# Patient Record
Sex: Female | Born: 1955
Health system: Southern US, Community
[De-identification: ages and names within clinical notes are randomized; demographics above are authoritative.]

## PROBLEM LIST (undated history)

## (undated) DIAGNOSIS — I25810 Atherosclerosis of coronary artery bypass graft(s) without angina pectoris: Secondary | ICD-10-CM

## (undated) DIAGNOSIS — I10 Essential (primary) hypertension: Secondary | ICD-10-CM

## (undated) DIAGNOSIS — I2510 Atherosclerotic heart disease of native coronary artery without angina pectoris: Secondary | ICD-10-CM

## (undated) DIAGNOSIS — M109 Gout, unspecified: Secondary | ICD-10-CM

## (undated) DIAGNOSIS — R51 Headache: Secondary | ICD-10-CM

## (undated) DIAGNOSIS — E669 Obesity, unspecified: Secondary | ICD-10-CM

## (undated) DIAGNOSIS — E785 Hyperlipidemia, unspecified: Secondary | ICD-10-CM

## (undated) DIAGNOSIS — I509 Heart failure, unspecified: Secondary | ICD-10-CM

## (undated) DIAGNOSIS — I739 Peripheral vascular disease, unspecified: Secondary | ICD-10-CM

## (undated) DIAGNOSIS — I209 Angina pectoris, unspecified: Secondary | ICD-10-CM

## (undated) DIAGNOSIS — E1140 Type 2 diabetes mellitus with diabetic neuropathy, unspecified: Secondary | ICD-10-CM

## (undated) DIAGNOSIS — Z9289 Personal history of other medical treatment: Secondary | ICD-10-CM

## (undated) DIAGNOSIS — I219 Acute myocardial infarction, unspecified: Secondary | ICD-10-CM

## (undated) DIAGNOSIS — I639 Cerebral infarction, unspecified: Secondary | ICD-10-CM

## (undated) DIAGNOSIS — H269 Unspecified cataract: Secondary | ICD-10-CM

## (undated) DIAGNOSIS — G43909 Migraine, unspecified, not intractable, without status migrainosus: Secondary | ICD-10-CM

## (undated) DIAGNOSIS — K219 Gastro-esophageal reflux disease without esophagitis: Secondary | ICD-10-CM

## (undated) DIAGNOSIS — N189 Chronic kidney disease, unspecified: Secondary | ICD-10-CM

## (undated) DIAGNOSIS — R519 Headache, unspecified: Secondary | ICD-10-CM

## (undated) DIAGNOSIS — M1990 Unspecified osteoarthritis, unspecified site: Secondary | ICD-10-CM

## (undated) DIAGNOSIS — E119 Type 2 diabetes mellitus without complications: Secondary | ICD-10-CM

## (undated) DIAGNOSIS — D649 Anemia, unspecified: Secondary | ICD-10-CM

## (undated) DIAGNOSIS — F419 Anxiety disorder, unspecified: Secondary | ICD-10-CM

## (undated) DIAGNOSIS — J189 Pneumonia, unspecified organism: Secondary | ICD-10-CM

## (undated) DIAGNOSIS — J42 Unspecified chronic bronchitis: Secondary | ICD-10-CM

## (undated) DIAGNOSIS — E039 Hypothyroidism, unspecified: Secondary | ICD-10-CM

## (undated) HISTORY — DX: Hyperlipidemia, unspecified: E78.5

## (undated) HISTORY — DX: Unspecified cataract: H26.9

## (undated) HISTORY — PX: PERIPHERAL ARTERIAL STENT GRAFT: SHX2220

## (undated) HISTORY — DX: Obesity, unspecified: E66.9

## (undated) HISTORY — PX: CATARACT EXTRACTION, BILATERAL: SHX1313

## (undated) HISTORY — PX: CORONARY ANGIOPLASTY WITH STENT PLACEMENT: SHX49

## (undated) HISTORY — DX: Atherosclerotic heart disease of native coronary artery without angina pectoris: I25.10

## (undated) HISTORY — PX: BREAST CYST EXCISION: SHX579

## (undated) HISTORY — PX: OTHER SURGICAL HISTORY: SHX169

## (undated) HISTORY — DX: Hypothyroidism, unspecified: E03.9

## (undated) SURGERY — COLONOSCOPY WITH PROPOFOL
Anesthesia: Monitor Anesthesia Care

---

## 1978-04-15 HISTORY — PX: APPENDECTOMY: SHX54

## 1978-04-15 HISTORY — PX: TUBAL LIGATION: SHX77

## 1980-04-15 HISTORY — PX: CHOLECYSTECTOMY: SHX55

## 1997-12-15 ENCOUNTER — Emergency Department (HOSPITAL_COMMUNITY): Admission: EM | Admit: 1997-12-15 | Discharge: 1997-12-15 | Payer: Self-pay | Admitting: Internal Medicine

## 1998-05-19 ENCOUNTER — Ambulatory Visit (HOSPITAL_COMMUNITY): Admission: RE | Admit: 1998-05-19 | Discharge: 1998-05-19 | Payer: Self-pay | Admitting: Family Medicine

## 1998-05-19 ENCOUNTER — Encounter: Payer: Self-pay | Admitting: Family Medicine

## 2000-04-15 DIAGNOSIS — I2510 Atherosclerotic heart disease of native coronary artery without angina pectoris: Secondary | ICD-10-CM

## 2000-04-15 DIAGNOSIS — Z9289 Personal history of other medical treatment: Secondary | ICD-10-CM

## 2000-04-15 HISTORY — DX: Atherosclerotic heart disease of native coronary artery without angina pectoris: I25.10

## 2000-04-15 HISTORY — DX: Personal history of other medical treatment: Z92.89

## 2000-04-15 HISTORY — PX: CARDIAC CATHETERIZATION: SHX172

## 2000-11-15 ENCOUNTER — Encounter: Payer: Self-pay | Admitting: *Deleted

## 2000-11-15 ENCOUNTER — Inpatient Hospital Stay (HOSPITAL_COMMUNITY): Admission: EM | Admit: 2000-11-15 | Discharge: 2000-11-28 | Payer: Self-pay | Admitting: *Deleted

## 2000-11-20 ENCOUNTER — Encounter: Payer: Self-pay | Admitting: Thoracic Surgery (Cardiothoracic Vascular Surgery)

## 2000-11-20 HISTORY — PX: CORONARY ARTERY BYPASS GRAFT: SHX141

## 2000-11-21 ENCOUNTER — Encounter: Payer: Self-pay | Admitting: Thoracic Surgery (Cardiothoracic Vascular Surgery)

## 2000-11-22 ENCOUNTER — Encounter: Payer: Self-pay | Admitting: Thoracic Surgery (Cardiothoracic Vascular Surgery)

## 2000-11-25 ENCOUNTER — Encounter: Payer: Self-pay | Admitting: Thoracic Surgery (Cardiothoracic Vascular Surgery)

## 2000-11-26 ENCOUNTER — Encounter: Payer: Self-pay | Admitting: Thoracic Surgery (Cardiothoracic Vascular Surgery)

## 2000-11-27 ENCOUNTER — Encounter: Payer: Self-pay | Admitting: Thoracic Surgery (Cardiothoracic Vascular Surgery)

## 2000-11-28 ENCOUNTER — Encounter: Payer: Self-pay | Admitting: Thoracic Surgery (Cardiothoracic Vascular Surgery)

## 2000-11-30 ENCOUNTER — Emergency Department (HOSPITAL_COMMUNITY): Admission: EM | Admit: 2000-11-30 | Discharge: 2000-11-30 | Payer: Self-pay | Admitting: *Deleted

## 2000-11-30 ENCOUNTER — Encounter: Payer: Self-pay | Admitting: Thoracic Surgery (Cardiothoracic Vascular Surgery)

## 2000-12-08 ENCOUNTER — Encounter: Payer: Self-pay | Admitting: Thoracic Surgery (Cardiothoracic Vascular Surgery)

## 2000-12-08 ENCOUNTER — Encounter
Admission: RE | Admit: 2000-12-08 | Discharge: 2000-12-08 | Payer: Self-pay | Admitting: Thoracic Surgery (Cardiothoracic Vascular Surgery)

## 2000-12-31 ENCOUNTER — Encounter (HOSPITAL_COMMUNITY): Admission: RE | Admit: 2000-12-31 | Discharge: 2001-01-26 | Payer: Self-pay | Admitting: Cardiology

## 2003-09-26 ENCOUNTER — Encounter: Admission: RE | Admit: 2003-09-26 | Discharge: 2003-12-25 | Payer: Self-pay | Admitting: Family Medicine

## 2004-08-27 HISTORY — PX: NM MYOCAR PERF WALL MOTION: HXRAD629

## 2004-10-01 ENCOUNTER — Encounter: Admission: RE | Admit: 2004-10-01 | Discharge: 2004-10-01 | Payer: Self-pay | Admitting: Cardiology

## 2004-10-04 ENCOUNTER — Ambulatory Visit (HOSPITAL_COMMUNITY): Admission: RE | Admit: 2004-10-04 | Discharge: 2004-10-05 | Payer: Self-pay | Admitting: Cardiology

## 2006-01-01 ENCOUNTER — Ambulatory Visit: Payer: Self-pay | Admitting: Family Medicine

## 2006-01-02 ENCOUNTER — Ambulatory Visit: Payer: Self-pay | Admitting: *Deleted

## 2006-01-22 ENCOUNTER — Ambulatory Visit: Payer: Self-pay | Admitting: Family Medicine

## 2006-03-23 ENCOUNTER — Emergency Department (HOSPITAL_COMMUNITY): Admission: EM | Admit: 2006-03-23 | Discharge: 2006-03-23 | Payer: Self-pay | Admitting: Emergency Medicine

## 2006-03-24 ENCOUNTER — Ambulatory Visit: Payer: Self-pay | Admitting: Family Medicine

## 2006-04-28 ENCOUNTER — Ambulatory Visit: Payer: Self-pay | Admitting: Family Medicine

## 2006-06-19 ENCOUNTER — Ambulatory Visit: Payer: Self-pay | Admitting: Family Medicine

## 2006-07-31 ENCOUNTER — Ambulatory Visit: Payer: Self-pay | Admitting: Family Medicine

## 2006-12-10 ENCOUNTER — Ambulatory Visit: Payer: Self-pay | Admitting: Internal Medicine

## 2006-12-31 ENCOUNTER — Encounter (INDEPENDENT_AMBULATORY_CARE_PROVIDER_SITE_OTHER): Payer: Self-pay | Admitting: *Deleted

## 2007-02-11 ENCOUNTER — Ambulatory Visit: Payer: Self-pay | Admitting: Internal Medicine

## 2007-03-04 ENCOUNTER — Ambulatory Visit: Payer: Self-pay | Admitting: Internal Medicine

## 2007-03-04 LAB — CONVERTED CEMR LAB
BUN: 17 mg/dL (ref 6–23)
Chloride: 103 meq/L (ref 96–112)
Glucose, Bld: 219 mg/dL — ABNORMAL HIGH (ref 70–99)
HDL: 45 mg/dL (ref >39)
Potassium: 4.5 meq/L (ref 3.5–5.3)
Total CHOL/HDL Ratio: 5.4
Total Protein: 7.5 g/dL (ref 6.0–8.3)
VLDL: 54 mg/dL — ABNORMAL HIGH (ref 0–40)

## 2007-05-01 ENCOUNTER — Ambulatory Visit: Payer: Self-pay | Admitting: Internal Medicine

## 2007-05-01 LAB — CONVERTED CEMR LAB
ALT: 12 units/L (ref 0–35)
Alkaline Phosphatase: 97 units/L (ref 39–117)
BUN: 17 mg/dL (ref 6–23)
Calcium: 10.3 mg/dL (ref 8.4–10.5)
Cholesterol: 268 mg/dL — ABNORMAL HIGH (ref 0–200)
Helicobacter Pylori Antibody-IgG: 1 — ABNORMAL HIGH
LDL Cholesterol: 172 mg/dL — ABNORMAL HIGH (ref 0–99)
Potassium: 4.4 meq/L (ref 3.5–5.3)
Sodium: 138 meq/L (ref 135–145)
Total Bilirubin: 0.3 mg/dL (ref 0.3–1.2)
Triglycerides: 226 mg/dL — ABNORMAL HIGH (ref <150)
VLDL: 45 mg/dL — ABNORMAL HIGH (ref 0–40)

## 2007-05-13 ENCOUNTER — Emergency Department (HOSPITAL_COMMUNITY): Admission: EM | Admit: 2007-05-13 | Discharge: 2007-05-13 | Payer: Self-pay | Admitting: Emergency Medicine

## 2007-05-19 ENCOUNTER — Ambulatory Visit: Payer: Self-pay | Admitting: Internal Medicine

## 2007-06-04 ENCOUNTER — Ambulatory Visit: Payer: Self-pay | Admitting: Thoracic Surgery (Cardiothoracic Vascular Surgery)

## 2007-07-21 ENCOUNTER — Ambulatory Visit: Payer: Self-pay | Admitting: Internal Medicine

## 2007-10-11 ENCOUNTER — Emergency Department (HOSPITAL_COMMUNITY): Admission: EM | Admit: 2007-10-11 | Discharge: 2007-10-11 | Payer: Self-pay | Admitting: Emergency Medicine

## 2007-11-05 ENCOUNTER — Ambulatory Visit: Payer: Self-pay | Admitting: Internal Medicine

## 2008-01-20 ENCOUNTER — Ambulatory Visit: Payer: Self-pay | Admitting: Internal Medicine

## 2008-03-15 ENCOUNTER — Ambulatory Visit: Payer: Self-pay | Admitting: Internal Medicine

## 2008-03-15 LAB — CONVERTED CEMR LAB
BUN: 15 mg/dL (ref 6–23)
Basophils Absolute: 0 10*3/uL (ref 0.0–0.1)
CO2: 22 meq/L (ref 19–32)
Chloride: 103 meq/L (ref 96–112)
HCT: 40.6 % (ref 36.0–46.0)
Hemoglobin: 13.9 g/dL (ref 12.0–15.0)
Lymphocytes Relative: 29 % (ref 12–46)
Lymphs Abs: 1.6 10*3/uL (ref 0.7–4.0)
Monocytes Absolute: 0.3 10*3/uL (ref 0.1–1.0)
Monocytes Relative: 5 % (ref 3–12)
Neutro Abs: 3.4 10*3/uL (ref 1.7–7.7)
Sodium: 141 meq/L (ref 135–145)

## 2008-05-11 ENCOUNTER — Ambulatory Visit (HOSPITAL_COMMUNITY): Admission: RE | Admit: 2008-05-11 | Discharge: 2008-05-11 | Payer: Self-pay | Admitting: Internal Medicine

## 2008-05-11 ENCOUNTER — Encounter: Payer: Self-pay | Admitting: Internal Medicine

## 2008-05-25 ENCOUNTER — Emergency Department (HOSPITAL_COMMUNITY): Admission: EM | Admit: 2008-05-25 | Discharge: 2008-05-25 | Payer: Self-pay | Admitting: Family Medicine

## 2008-07-07 ENCOUNTER — Ambulatory Visit: Payer: Self-pay | Admitting: Internal Medicine

## 2008-07-13 ENCOUNTER — Encounter (INDEPENDENT_AMBULATORY_CARE_PROVIDER_SITE_OTHER): Payer: Self-pay | Admitting: *Deleted

## 2008-10-28 ENCOUNTER — Ambulatory Visit: Payer: Self-pay | Admitting: Internal Medicine

## 2008-11-04 ENCOUNTER — Ambulatory Visit (HOSPITAL_COMMUNITY): Admission: RE | Admit: 2008-11-04 | Discharge: 2008-11-04 | Payer: Self-pay | Admitting: Internal Medicine

## 2008-12-30 ENCOUNTER — Ambulatory Visit: Payer: Self-pay | Admitting: Internal Medicine

## 2009-02-28 ENCOUNTER — Ambulatory Visit: Payer: Self-pay | Admitting: Internal Medicine

## 2009-04-17 ENCOUNTER — Emergency Department (HOSPITAL_COMMUNITY): Admission: EM | Admit: 2009-04-17 | Discharge: 2009-04-17 | Payer: Self-pay | Admitting: Emergency Medicine

## 2009-04-30 ENCOUNTER — Emergency Department (HOSPITAL_COMMUNITY): Admission: EM | Admit: 2009-04-30 | Discharge: 2009-04-30 | Payer: Self-pay | Admitting: Emergency Medicine

## 2009-05-31 ENCOUNTER — Ambulatory Visit: Payer: Self-pay | Admitting: Internal Medicine

## 2009-05-31 LAB — CONVERTED CEMR LAB: GC Probe Amp, Urine: NEGATIVE

## 2009-08-22 ENCOUNTER — Ambulatory Visit: Payer: Self-pay | Admitting: Internal Medicine

## 2009-09-02 ENCOUNTER — Ambulatory Visit: Payer: Self-pay | Admitting: Critical Care Medicine

## 2009-09-03 ENCOUNTER — Inpatient Hospital Stay (HOSPITAL_COMMUNITY): Admission: EM | Admit: 2009-09-03 | Discharge: 2009-09-11 | Payer: Self-pay | Admitting: Emergency Medicine

## 2009-09-04 ENCOUNTER — Encounter (INDEPENDENT_AMBULATORY_CARE_PROVIDER_SITE_OTHER): Payer: Self-pay | Admitting: Pulmonary Disease

## 2009-09-08 ENCOUNTER — Ambulatory Visit: Payer: Self-pay | Admitting: Vascular Surgery

## 2009-09-08 ENCOUNTER — Encounter (INDEPENDENT_AMBULATORY_CARE_PROVIDER_SITE_OTHER): Payer: Self-pay | Admitting: Internal Medicine

## 2009-11-24 ENCOUNTER — Ambulatory Visit (HOSPITAL_COMMUNITY): Admission: RE | Admit: 2009-11-24 | Discharge: 2009-11-24 | Payer: Self-pay | Admitting: Internal Medicine

## 2009-11-27 ENCOUNTER — Ambulatory Visit: Payer: Self-pay | Admitting: Internal Medicine

## 2009-11-27 LAB — CONVERTED CEMR LAB
ALT: 15 units/L (ref 0–35)
Albumin: 4.4 g/dL (ref 3.5–5.2)
CO2: 26 meq/L (ref 19–32)
Calcium: 9.8 mg/dL (ref 8.4–10.5)
Chloride: 105 meq/L (ref 96–112)
Cholesterol: 169 mg/dL (ref 0–200)
Eosinophils Relative: 8 % — ABNORMAL HIGH (ref 0–5)
Glucose, Bld: 212 mg/dL — ABNORMAL HIGH (ref 70–99)
HDL: 43 mg/dL (ref >39)
LDL Cholesterol: 80 mg/dL (ref 0–99)
Lymphs Abs: 1.5 10*3/uL (ref 0.7–4.0)
MCHC: 32.9 g/dL (ref 30.0–36.0)
Monocytes Absolute: 0.4 10*3/uL (ref 0.1–1.0)
Monocytes Relative: 6 % (ref 3–12)
Platelets: 322 10*3/uL (ref 150–400)
RBC: 4.03 M/uL (ref 3.87–5.11)
RDW: 14.3 % (ref 11.5–15.5)
Total CHOL/HDL Ratio: 3.9
Total Protein: 7.9 g/dL (ref 6.0–8.3)
VLDL: 46 mg/dL — ABNORMAL HIGH (ref 0–40)
WBC: 6.4 10*3/uL (ref 4.0–10.5)

## 2009-12-13 ENCOUNTER — Encounter: Admission: RE | Admit: 2009-12-13 | Discharge: 2009-12-13 | Payer: Self-pay | Admitting: Internal Medicine

## 2010-02-19 ENCOUNTER — Encounter (INDEPENDENT_AMBULATORY_CARE_PROVIDER_SITE_OTHER): Payer: Self-pay | Admitting: Internal Medicine

## 2010-02-19 LAB — CONVERTED CEMR LAB
Chloride: 103 meq/L (ref 96–112)
Potassium: 4.4 meq/L (ref 3.5–5.3)

## 2010-05-06 ENCOUNTER — Encounter: Payer: Self-pay | Admitting: Internal Medicine

## 2010-07-01 LAB — URINALYSIS, ROUTINE W REFLEX MICROSCOPIC
Glucose, UA: NEGATIVE mg/dL
Nitrite: POSITIVE — AB
pH: 5.5 (ref 5.0–8.0)

## 2010-07-01 LAB — RAPID STREP SCREEN (MED CTR MEBANE ONLY): Streptococcus, Group A Screen (Direct): NEGATIVE

## 2010-07-01 LAB — URINE MICROSCOPIC-ADD ON

## 2010-07-02 LAB — BASIC METABOLIC PANEL
BUN: 17 mg/dL (ref 6–23)
BUN: 19 mg/dL (ref 6–23)
BUN: 19 mg/dL (ref 6–23)
BUN: 26 mg/dL — ABNORMAL HIGH (ref 6–23)
CO2: 22 mEq/L (ref 19–32)
CO2: 22 mEq/L (ref 19–32)
CO2: 24 mEq/L (ref 19–32)
CO2: 24 mEq/L (ref 19–32)
CO2: 25 mEq/L (ref 19–32)
Calcium: 8.6 mg/dL (ref 8.4–10.5)
Calcium: 8.9 mg/dL (ref 8.4–10.5)
Calcium: 9.2 mg/dL (ref 8.4–10.5)
Calcium: 9.5 mg/dL (ref 8.4–10.5)
Chloride: 109 mEq/L (ref 96–112)
Chloride: 112 mEq/L (ref 96–112)
Chloride: 112 mEq/L (ref 96–112)
Chloride: 116 mEq/L — ABNORMAL HIGH (ref 96–112)
Creatinine, Ser: 1.5 mg/dL — ABNORMAL HIGH (ref 0.4–1.2)
Creatinine, Ser: 1.58 mg/dL — ABNORMAL HIGH (ref 0.4–1.2)
GFR calc Af Amer: 24 mL/min — ABNORMAL LOW (ref >60)
GFR calc Af Amer: 39 mL/min — ABNORMAL LOW (ref >60)
GFR calc Af Amer: 41 mL/min — ABNORMAL LOW (ref >60)
GFR calc Af Amer: 42 mL/min — ABNORMAL LOW (ref >60)
GFR calc Af Amer: 44 mL/min — ABNORMAL LOW (ref >60)
GFR calc non Af Amer: 19 mL/min — ABNORMAL LOW (ref >60)
GFR calc non Af Amer: 30 mL/min — ABNORMAL LOW (ref >60)
GFR calc non Af Amer: 34 mL/min — ABNORMAL LOW (ref >60)
GFR calc non Af Amer: 36 mL/min — ABNORMAL LOW (ref >60)
Glucose, Bld: 150 mg/dL — ABNORMAL HIGH (ref 70–99)
Glucose, Bld: 166 mg/dL — ABNORMAL HIGH (ref 70–99)
Glucose, Bld: 240 mg/dL — ABNORMAL HIGH (ref 70–99)
Glucose, Bld: 258 mg/dL — ABNORMAL HIGH (ref 70–99)
Potassium: 3.5 mEq/L (ref 3.5–5.1)
Potassium: 3.9 mEq/L (ref 3.5–5.1)
Potassium: 3.9 mEq/L (ref 3.5–5.1)
Potassium: 3.9 mEq/L (ref 3.5–5.1)
Potassium: 4 mEq/L (ref 3.5–5.1)
Potassium: 4.7 mEq/L (ref 3.5–5.1)
Sodium: 138 mEq/L (ref 135–145)
Sodium: 138 mEq/L (ref 135–145)
Sodium: 140 mEq/L (ref 135–145)
Sodium: 142 mEq/L (ref 135–145)
Sodium: 142 mEq/L (ref 135–145)

## 2010-07-02 LAB — CARDIAC PANEL(CRET KIN+CKTOT+MB+TROPI)
CK, MB: 2.3 ng/mL (ref 0.3–4.0)
CK, MB: 3.3 ng/mL (ref 0.3–4.0)
CK, MB: 33.9 ng/mL (ref 0.3–4.0)
CK, MB: 6.6 ng/mL (ref 0.3–4.0)
Relative Index: 0.2 (ref 0.0–2.5)
Relative Index: 0.2 (ref 0.0–2.5)
Relative Index: 0.4 (ref 0.0–2.5)
Relative Index: 0.5 (ref 0.0–2.5)
Relative Index: 0.7 (ref 0.0–2.5)
Total CK: 1162 U/L — ABNORMAL HIGH (ref 7–177)
Total CK: 2706 U/L — ABNORMAL HIGH (ref 7–177)
Total CK: 4596 U/L — ABNORMAL HIGH (ref 7–177)
Total CK: 815 U/L — ABNORMAL HIGH (ref 7–177)
Total CK: 825 U/L — ABNORMAL HIGH (ref 7–177)
Troponin I: 2.44 ng/mL (ref 0.00–0.06)
Troponin I: 4.47 ng/mL (ref 0.00–0.06)
Troponin I: 7.49 ng/mL (ref 0.00–0.06)

## 2010-07-02 LAB — GLUCOSE, CAPILLARY
Glucose-Capillary: 195 mg/dL — ABNORMAL HIGH (ref 70–99)
Glucose-Capillary: 238 mg/dL — ABNORMAL HIGH (ref 70–99)
Glucose-Capillary: 108 mg/dL — ABNORMAL HIGH (ref 70–99)
Glucose-Capillary: 119 mg/dL — ABNORMAL HIGH (ref 70–99)
Glucose-Capillary: 123 mg/dL — ABNORMAL HIGH (ref 70–99)
Glucose-Capillary: 130 mg/dL — ABNORMAL HIGH (ref 70–99)
Glucose-Capillary: 133 mg/dL — ABNORMAL HIGH (ref 70–99)
Glucose-Capillary: 134 mg/dL — ABNORMAL HIGH (ref 70–99)
Glucose-Capillary: 135 mg/dL — ABNORMAL HIGH (ref 70–99)
Glucose-Capillary: 140 mg/dL — ABNORMAL HIGH (ref 70–99)
Glucose-Capillary: 143 mg/dL — ABNORMAL HIGH (ref 70–99)
Glucose-Capillary: 149 mg/dL — ABNORMAL HIGH (ref 70–99)
Glucose-Capillary: 152 mg/dL — ABNORMAL HIGH (ref 70–99)
Glucose-Capillary: 154 mg/dL — ABNORMAL HIGH (ref 70–99)
Glucose-Capillary: 156 mg/dL — ABNORMAL HIGH (ref 70–99)
Glucose-Capillary: 161 mg/dL — ABNORMAL HIGH (ref 70–99)
Glucose-Capillary: 163 mg/dL — ABNORMAL HIGH (ref 70–99)
Glucose-Capillary: 163 mg/dL — ABNORMAL HIGH (ref 70–99)
Glucose-Capillary: 169 mg/dL — ABNORMAL HIGH (ref 70–99)
Glucose-Capillary: 172 mg/dL — ABNORMAL HIGH (ref 70–99)
Glucose-Capillary: 175 mg/dL — ABNORMAL HIGH (ref 70–99)
Glucose-Capillary: 180 mg/dL — ABNORMAL HIGH (ref 70–99)
Glucose-Capillary: 182 mg/dL — ABNORMAL HIGH (ref 70–99)
Glucose-Capillary: 186 mg/dL — ABNORMAL HIGH (ref 70–99)
Glucose-Capillary: 188 mg/dL — ABNORMAL HIGH (ref 70–99)
Glucose-Capillary: 190 mg/dL — ABNORMAL HIGH (ref 70–99)
Glucose-Capillary: 194 mg/dL — ABNORMAL HIGH (ref 70–99)
Glucose-Capillary: 197 mg/dL — ABNORMAL HIGH (ref 70–99)
Glucose-Capillary: 199 mg/dL — ABNORMAL HIGH (ref 70–99)
Glucose-Capillary: 202 mg/dL — ABNORMAL HIGH (ref 70–99)
Glucose-Capillary: 202 mg/dL — ABNORMAL HIGH (ref 70–99)
Glucose-Capillary: 211 mg/dL — ABNORMAL HIGH (ref 70–99)
Glucose-Capillary: 212 mg/dL — ABNORMAL HIGH (ref 70–99)
Glucose-Capillary: 218 mg/dL — ABNORMAL HIGH (ref 70–99)
Glucose-Capillary: 222 mg/dL — ABNORMAL HIGH (ref 70–99)
Glucose-Capillary: 223 mg/dL — ABNORMAL HIGH (ref 70–99)
Glucose-Capillary: 235 mg/dL — ABNORMAL HIGH (ref 70–99)
Glucose-Capillary: 237 mg/dL — ABNORMAL HIGH (ref 70–99)
Glucose-Capillary: 243 mg/dL — ABNORMAL HIGH (ref 70–99)
Glucose-Capillary: 259 mg/dL — ABNORMAL HIGH (ref 70–99)
Glucose-Capillary: 273 mg/dL — ABNORMAL HIGH (ref 70–99)
Glucose-Capillary: 277 mg/dL — ABNORMAL HIGH (ref 70–99)
Glucose-Capillary: 285 mg/dL — ABNORMAL HIGH (ref 70–99)
Glucose-Capillary: 304 mg/dL — ABNORMAL HIGH (ref 70–99)
Glucose-Capillary: 373 mg/dL — ABNORMAL HIGH (ref 70–99)
Glucose-Capillary: >600 mg/dL (ref 70–99)

## 2010-07-02 LAB — CBC
HCT: 25.4 % — ABNORMAL LOW (ref 36.0–46.0)
HCT: 25.8 % — ABNORMAL LOW (ref 36.0–46.0)
HCT: 26.3 % — ABNORMAL LOW (ref 36.0–46.0)
HCT: 31.4 % — ABNORMAL LOW (ref 36.0–46.0)
HCT: 32.7 % — ABNORMAL LOW (ref 36.0–46.0)
HCT: 40.7 % (ref 36.0–46.0)
HCT: 46.3 % — ABNORMAL HIGH (ref 36.0–46.0)
Hemoglobin: 10.9 g/dL — ABNORMAL LOW (ref 12.0–15.0)
Hemoglobin: 14 g/dL (ref 12.0–15.0)
Hemoglobin: 15.9 g/dL — ABNORMAL HIGH (ref 12.0–15.0)
Hemoglobin: 8.7 g/dL — ABNORMAL LOW (ref 12.0–15.0)
Hemoglobin: 9 g/dL — ABNORMAL LOW (ref 12.0–15.0)
MCHC: 33.7 g/dL (ref 30.0–36.0)
MCHC: 34.1 g/dL (ref 30.0–36.0)
MCHC: 34.4 g/dL (ref 30.0–36.0)
MCHC: 34.4 g/dL (ref 30.0–36.0)
MCHC: 34.5 g/dL (ref 30.0–36.0)
MCHC: 34.5 g/dL (ref 30.0–36.0)
MCHC: 34.7 g/dL (ref 30.0–36.0)
MCV: 94.8 fL (ref 78.0–100.0)
MCV: 94.8 fL (ref 78.0–100.0)
MCV: 95 fL (ref 78.0–100.0)
MCV: 95.8 fL (ref 78.0–100.0)
Platelets: 169 10*3/uL (ref 150–400)
Platelets: 225 10*3/uL (ref 150–400)
Platelets: 253 10*3/uL (ref 150–400)
Platelets: 273 10*3/uL (ref 150–400)
Platelets: 290 10*3/uL (ref 150–400)
RBC: 2.69 MIL/uL — ABNORMAL LOW (ref 3.87–5.11)
RBC: 2.77 MIL/uL — ABNORMAL LOW (ref 3.87–5.11)
RBC: 3.45 MIL/uL — ABNORMAL LOW (ref 3.87–5.11)
RBC: 4.88 MIL/uL (ref 3.87–5.11)
RDW: 13.5 % (ref 11.5–15.5)
RDW: 13.5 % (ref 11.5–15.5)
RDW: 13.6 % (ref 11.5–15.5)
RDW: 13.7 % (ref 11.5–15.5)
RDW: 13.9 % (ref 11.5–15.5)
RDW: 14 % (ref 11.5–15.5)
WBC: 11 10*3/uL — ABNORMAL HIGH (ref 4.0–10.5)
WBC: 7.3 10*3/uL (ref 4.0–10.5)
WBC: 7.6 10*3/uL (ref 4.0–10.5)
WBC: 8.7 10*3/uL (ref 4.0–10.5)

## 2010-07-02 LAB — URINALYSIS, ROUTINE W REFLEX MICROSCOPIC
Bilirubin Urine: NEGATIVE
Bilirubin Urine: NEGATIVE
Bilirubin Urine: NEGATIVE
Ketones, ur: NEGATIVE mg/dL
Ketones, ur: NEGATIVE mg/dL
Ketones, ur: NEGATIVE mg/dL
Leukocytes, UA: NEGATIVE
Nitrite: NEGATIVE
Nitrite: NEGATIVE
Protein, ur: 30 mg/dL — AB
Specific Gravity, Urine: 1.016 (ref 1.005–1.030)
Specific Gravity, Urine: 1.02 (ref 1.005–1.030)
Specific Gravity, Urine: 1.026 (ref 1.005–1.030)
Urobilinogen, UA: 0.2 mg/dL (ref 0.0–1.0)
Urobilinogen, UA: 0.2 mg/dL (ref 0.0–1.0)
Urobilinogen, UA: 0.2 mg/dL (ref 0.0–1.0)
Urobilinogen, UA: 0.2 mg/dL (ref 0.0–1.0)
pH: 5 (ref 5.0–8.0)
pH: 6 (ref 5.0–8.0)

## 2010-07-02 LAB — POCT I-STAT 3, ART BLOOD GAS (G3+)
Acid-base deficit: 1 mmol/L (ref 0.0–2.0)
Acid-base deficit: 2 mmol/L (ref 0.0–2.0)
Bicarbonate: 22.2 mEq/L (ref 20.0–24.0)
Bicarbonate: 26 mEq/L — ABNORMAL HIGH (ref 20.0–24.0)
O2 Saturation: 91 %
O2 Saturation: 95 %
O2 Saturation: 95 %
O2 Saturation: 98 %
Patient temperature: 39.8
Patient temperature: 40.2
Patient temperature: 98.6
TCO2: 20 mmol/L (ref 0–100)
TCO2: 22 mmol/L (ref 0–100)
TCO2: 23 mmol/L (ref 0–100)
TCO2: 26 mmol/L (ref 0–100)
pCO2 arterial: 37.4 mmHg (ref 35.0–45.0)
pCO2 arterial: 38.3 mmHg (ref 35.0–45.0)
pCO2 arterial: 46.5 mmHg — ABNORMAL HIGH (ref 35.0–45.0)
pH, Arterial: 7.367 (ref 7.350–7.400)
pO2, Arterial: 66 mmHg — ABNORMAL LOW (ref 80.0–100.0)
pO2, Arterial: 91 mmHg (ref 80.0–100.0)

## 2010-07-02 LAB — HEPATIC FUNCTION PANEL
Albumin: 2.9 g/dL — ABNORMAL LOW (ref 3.5–5.2)
Indirect Bilirubin: 0.7 mg/dL (ref 0.3–0.9)
Total Bilirubin: 0.8 mg/dL (ref 0.3–1.2)
Total Protein: 6.5 g/dL (ref 6.0–8.3)

## 2010-07-02 LAB — MRSA PCR SCREENING: MRSA by PCR: NEGATIVE

## 2010-07-02 LAB — DIFFERENTIAL
Basophils Absolute: 0 10*3/uL (ref 0.0–0.1)
Basophils Relative: 0 % (ref 0–1)
Eosinophils Absolute: 0.1 10*3/uL (ref 0.0–0.7)
Eosinophils Absolute: 0.4 10*3/uL (ref 0.0–0.7)
Eosinophils Relative: 4 % (ref 0–5)
Lymphocytes Relative: 8 % — ABNORMAL LOW (ref 12–46)
Lymphs Abs: 1.1 10*3/uL (ref 0.7–4.0)
Lymphs Abs: 1.2 10*3/uL (ref 0.7–4.0)
Lymphs Abs: 2.5 10*3/uL (ref 0.7–4.0)
Monocytes Absolute: 0.5 10*3/uL (ref 0.1–1.0)
Monocytes Relative: 1 % — ABNORMAL LOW (ref 3–12)
Monocytes Relative: 5 % (ref 3–12)
Neutro Abs: 12.4 10*3/uL — ABNORMAL HIGH (ref 1.7–7.7)
Neutro Abs: 18.1 10*3/uL — ABNORMAL HIGH (ref 1.7–7.7)
Neutrophils Relative %: 86 % — ABNORMAL HIGH (ref 43–77)
Neutrophils Relative %: 87 % — ABNORMAL HIGH (ref 43–77)

## 2010-07-02 LAB — COMPREHENSIVE METABOLIC PANEL
ALT: 23 U/L (ref 0–35)
ALT: 33 U/L (ref 0–35)
AST: 38 U/L — ABNORMAL HIGH (ref 0–37)
AST: 69 U/L — ABNORMAL HIGH (ref 0–37)
Albumin: 2.3 g/dL — ABNORMAL LOW (ref 3.5–5.2)
Alkaline Phosphatase: 119 U/L — ABNORMAL HIGH (ref 39–117)
Alkaline Phosphatase: 81 U/L (ref 39–117)
BUN: 19 mg/dL (ref 6–23)
BUN: 22 mg/dL (ref 6–23)
CO2: 21 mEq/L (ref 19–32)
CO2: 24 mEq/L (ref 19–32)
Calcium: 8.7 mg/dL (ref 8.4–10.5)
Calcium: 8.8 mg/dL (ref 8.4–10.5)
Calcium: 9.6 mg/dL (ref 8.4–10.5)
Chloride: 112 mEq/L (ref 96–112)
Creatinine, Ser: 2.22 mg/dL — ABNORMAL HIGH (ref 0.4–1.2)
GFR calc Af Amer: 28 mL/min — ABNORMAL LOW (ref >60)
GFR calc Af Amer: 34 mL/min — ABNORMAL LOW (ref >60)
GFR calc non Af Amer: 23 mL/min — ABNORMAL LOW (ref >60)
GFR calc non Af Amer: 33 mL/min — ABNORMAL LOW (ref >60)
Glucose, Bld: 222 mg/dL — ABNORMAL HIGH (ref 70–99)
Glucose, Bld: 593 mg/dL (ref 70–99)
Potassium: 3.2 mEq/L — ABNORMAL LOW (ref 3.5–5.1)
Sodium: 147 mEq/L — ABNORMAL HIGH (ref 135–145)
Total Bilirubin: 1 mg/dL (ref 0.3–1.2)
Total Protein: 6.2 g/dL (ref 6.0–8.3)
Total Protein: 8.8 g/dL — ABNORMAL HIGH (ref 6.0–8.3)

## 2010-07-02 LAB — PROTIME-INR
INR: 1 (ref 0.00–1.49)
INR: 1.12 (ref 0.00–1.49)
Prothrombin Time: 13.1 seconds (ref 11.6–15.2)
Prothrombin Time: 14.3 seconds (ref 11.6–15.2)

## 2010-07-02 LAB — CK TOTAL AND CKMB (NOT AT ARMC)
CK, MB: 2.9 ng/mL (ref 0.3–4.0)
CK, MB: 3.5 ng/mL (ref 0.3–4.0)
Relative Index: 0.6 (ref 0.0–2.5)
Total CK: 126 U/L (ref 7–177)
Total CK: 610 U/L — ABNORMAL HIGH (ref 7–177)

## 2010-07-02 LAB — POCT CARDIAC MARKERS
Myoglobin, poc: 163 ng/mL (ref 12–200)
Troponin i, poc: <0.05 ng/mL (ref 0.00–0.09)

## 2010-07-02 LAB — POCT I-STAT, CHEM 8
BUN: 29 mg/dL — ABNORMAL HIGH (ref 6–23)
Calcium, Ion: 1.06 mmol/L — ABNORMAL LOW (ref 1.12–1.32)
Calcium, Ion: 1.08 mmol/L — ABNORMAL LOW (ref 1.12–1.32)
Chloride: 105 mEq/L (ref 96–112)
Creatinine, Ser: 1.7 mg/dL — ABNORMAL HIGH (ref 0.4–1.2)
Glucose, Bld: 602 mg/dL (ref 70–99)
Glucose, Bld: 621 mg/dL (ref 70–99)
Hemoglobin: 16 g/dL — ABNORMAL HIGH (ref 12.0–15.0)
TCO2: 24 mmol/L (ref 0–100)

## 2010-07-02 LAB — SODIUM, URINE, RANDOM: Sodium, Ur: 66 mEq/L

## 2010-07-02 LAB — CULTURE, BLOOD (ROUTINE X 2)

## 2010-07-02 LAB — RAPID URINE DRUG SCREEN, HOSP PERFORMED
Amphetamines: NOT DETECTED
Barbiturates: NOT DETECTED
Benzodiazepines: NOT DETECTED
Cocaine: NOT DETECTED
Cocaine: NOT DETECTED
Opiates: NOT DETECTED

## 2010-07-02 LAB — URINE MICROSCOPIC-ADD ON

## 2010-07-02 LAB — LIPID PANEL
HDL: 30 mg/dL — ABNORMAL LOW (ref >39)
VLDL: UNDETERMINED mg/dL (ref 0–40)

## 2010-07-02 LAB — HEMOGLOBIN A1C
Hgb A1c MFr Bld: 11.7 % — ABNORMAL HIGH (ref <5.7)
Mean Plasma Glucose: 289 mg/dL — ABNORMAL HIGH (ref <117)

## 2010-07-02 LAB — URINE CULTURE

## 2010-07-02 LAB — CSF CELL COUNT WITH DIFFERENTIAL
RBC Count, CSF: 0 /mm3
Tube #: 1
WBC, CSF: 1 /mm3 (ref 0–5)

## 2010-07-02 LAB — CRYPTOCOCCAL ANTIGEN, CSF: Crypto Ag: NEGATIVE

## 2010-07-02 LAB — BRAIN NATRIURETIC PEPTIDE: Pro B Natriuretic peptide (BNP): 491 pg/mL — ABNORMAL HIGH (ref 0.0–100.0)

## 2010-07-02 LAB — PROTEIN AND GLUCOSE, CSF
Glucose, CSF: 124 mg/dL — ABNORMAL HIGH (ref 43–76)
Total  Protein, CSF: 58 mg/dL — ABNORMAL HIGH (ref 15–45)

## 2010-07-02 LAB — URIC ACID: Uric Acid, Serum: 7.7 mg/dL — ABNORMAL HIGH (ref 2.4–7.0)

## 2010-07-02 LAB — CULTURE, BAL-QUANTITATIVE W GRAM STAIN: Colony Count: 25000

## 2010-07-02 LAB — HSV(HERPES SMPLX VRS)ABS-I+II(IGG)-CSF

## 2010-07-02 LAB — VIRUS CULTURE: Preliminary Culture: NEGATIVE

## 2010-07-02 LAB — FUNGUS CULTURE W SMEAR

## 2010-07-02 LAB — POCT PREGNANCY, URINE: Preg Test, Ur: NEGATIVE

## 2010-07-02 LAB — APTT: aPTT: 22 seconds — ABNORMAL LOW (ref 24–37)

## 2010-07-02 LAB — AMMONIA: Ammonia: 34 umol/L (ref 11–35)

## 2010-07-02 LAB — PROCALCITONIN
Procalcitonin: 2.05 ng/mL
Procalcitonin: 2.98 ng/mL

## 2010-07-02 LAB — CSF CULTURE W GRAM STAIN

## 2010-08-31 NOTE — Discharge Summary (Signed)
[place]. [facility]  Patient:    Brooke Stafford, Brooke Stafford Visit Number: [id number] MRN: [id number]          Service Type: EMS Location: [facility] Attending Physician:  Burnard Hawthorne Dictated by:   Ollen Bowl, P.A.C. Admit Date:  11/30/2000 Discharge Date: 11/30/2000   CC:         Clayton Lefort, M.D.   Discharge Summary  DATE OF BIRTH:  1955/05/27  ADMISSION DIAGNOSES: 1. Coronary artery disease. 2. Hypertension. 3. Hypercholesterolemia. 4. History of tobacco use. 5. Obesity.  DISCHARGE DIAGNOSES:  Coronary artery disease.  PROCEDURE: 1. Cardiac catheterization. 2. Coronary artery bypass graft x 6.  HOSPITAL COURSE:  Brooke Stafford is a patient with known coronary artery disease who was admitted to [facility] on November 15, 2000, secondary to extreme shortness of breath.  The patient was felt to have pneumonia and so was started on antibiotics.  She also underwent cardiovascular workup including cardiac catheterization.  This revealed that the patient had significant coronary artery disease that was amenable to surgery.  Because of this Dr. Roxy Manns was consulted.  On November 20, 2000, Dr. Roxy Manns performed a coronary artery bypass graft x 6 with left internal mammary artery anastomosis to the left anterior descending artery, saphenuos vein graft to the diagonal artery, sequential saphenuos vein graft to the posterior descending and posterolateral arteries, and a sequential saphenuos vein graft to the circumflex and intermediate arteries.  No complications were noted during the procedure. Postoperatively, the patient required low dose pressor support with milrinone. This was weaned as appropriate.  The patients postoperative course was felt to be uneventful.  There was some difficulty obtaining blood pressure control, however, excellent control was obtained with the addition of clonidine to routine medications.  The patient was also found to have a  large left pulmonary effusion which was alleviated with thoracentesis.  No complications were noted after this thoracentesis.  During her hospitalization, the patient also underwent a course of ciprofloxacin for Enterococcus urinary tract infection.  She was subsequently discharged home in stable condition on postoperative day #5, November 25, 2000.  DISCHARGE MEDICATIONS: 1. Tylox one to two tablets every four to six hours as needed for pain. 2. Aspirin 325 mg one tablet daily. 3. Lopressor 100 mg one tablet every 12 hours. 4. Altace 10 mg one tablet every 12 hours. 5. Zocor 20 mg one tablet daily. 6. Clonidine 0.2 mg one tablet twice daily.  ACTIVITY:  The patient was told to avoid driving, strenuous activity, or lifting heavy objects.  DIET:  800 calorie ADA diet.  WOUND CARE:  The patient was told she could shower and clean her incisions with soap and water.  DISPOSITION:  Home.  FOLLOW-UP:  The patient was told to follow up with her cardiologist, Dr. Nadyne Coombes on August 27, at [facility] and [place].  She was also told to follow up with Dr. Roxy Manns on Monday, August 26, at 12 p.m.  She was told to go to the [facility] one hour before her appointment with Dr. Roxy Manns. Dictated by:   Ollen Bowl, P.A.C. Attending Physician:  Burnard Hawthorne DD:  12/16/00 TD:  12/16/00 Job: [id number]

## 2010-08-31 NOTE — Consult Note (Signed)
[place]. [facility]  Patient:    Brooke Stafford, Brooke Stafford                     MRN: [id number] Proc. Date: 11/16/00 Adm. Date:  [id number] Attending:  Sharrell Ku Dictator:   Kela Millin, M.D. CC:         Brooke Stafford, M.D.  [facility]   Consultation Report  REASON FOR CONSULTATION:  Chest pain and positive troponins.  IMPRESSION: 1. Chest pain probably suggestive of unstable angina pectoris.  This chest    pain which is heavy in nature and situated in the retrosternal area,    associated with shortness of breath and orthopnea, has been noticed in the    last 6 months on and off. 2. CPK index is negative for myocardial injury, however, the troponins are    positive.  This could be secondary to unstable angina.  Presently the    patient appears to be stable hemodynamically.  Her chest pain and shortness    of breath has markedly decreased and she is presently almost asymptomatic. 3. No significant EKG changes. 4. No clinical evidence of ______  or aortic dissection on physical examination. 5. Hypertension uncontrolled and not on any therapy.  The patient has been    recently started on atacand. 6. ______ smoking. 7. EKG reveals mew T wave inversion in V1 and V2 suggestive of myocardial    ischemia with a non Q wave myocardial infarction complete with a EKG    done on November 15, 2000. This latest EKG was done November 16, 2000, at 6:30    p.m.  This is just probably a non Q wave myocardial infarction.  RECOMMENDATIONS: 1. History of smoking, hypertension, and positive troponins.  Recommend    cardiac catheterization to evaluate her coronary anatomy.  This would help    in both therapeutic and diagnostic purposes.  Also during her visit I would    also suspect the decreased sensitivity and specificity especially given her    physique.     If we perform a stress test it will be a adenosine Cardiolite stress test    as my  suspicion is moderately high for unstable angina.  Exercise stress    test will not be performed.  2. HYPERTENSION:  Controlled. A greed with atacand. Will also add    beta-blockade.  Will chest her TSH and lipid panel.  Add aspirin p.o. q.    day.  3. Explain to her regarding cardiac catheterization risks and benefits.  The    patient wants to think about it and wants to skip over this.  Further    recommendations to follow following the availability of lipid panel and    also TSH and also her consent.  4. Given her significant EKG changes will also start the patient on IV heparin    and follow the protocol for unstable angina.  BRIEF HISTORY:  [name] is a 55 year old African-American female with a history of hypertension, who has been doing well until the past 6 months.  She has noticed heaviness in the chest which comes on and off at times.  She states that she has about 2 to 3 episodes per week.  Each episode lasts for about 10 minutes.  It is also associated with shortness of breath.  She stated that it comes on with exertion and also at times with rest.  There is no radiation of  this pain and she explains the pain as heaviness in the chest.  In the last 6 months she has also noticed shortness of breath.  She stated that she has started using 2 pillows when she sleeps.  This is something new to her.  She feels it is hard to breath especially if she lies down.  She denies any palpitations.  yesterday while sitting, and watching T.V. she suddenly felt short of breath and she was diaphoretic and started coughing, and she threw up.  She also noticed chest heaviness at that time.  She presented to emergency room.  She was evaluated for pulmonary embolism and also pneumonia and a CPK was ordered and troponin was found to be positive and hence a consultation was obtained.  PAST MEDICAL HISTORY:  Hypertension for which she was not on any therapy until today.  PAST SURGICAL  HISTORY:  She has had resection x 2 about 20 years ago and cholecystectomy about 15 years ago.  MEDICATIONS:  Home medications:  None.  ALLERGIES:  PENICILLIN.  She breaks out in hives.  SOCIAL HISTORY:  She is married.  She smokes about 1 pack of cigarettes per day.  Occasional drinking beer.  No history of illicit drug abuse.  FAMILY HISTORY:  There is no history of coronary artery disease in the family.  Her mother did have a stroke at the age of 68.  Her father is healthy at the age of 69.  The mother had a myocardial infarction at the age of 40.  She is also diabetic.  The father is healthy at the age of 44.  She has 2 brothers, and 1 sister who are also healthy.  REVIEW OF SYSTEMS:  She denies any bowel or bladder difficulties.  She denies any neurological weakness.  She denies any loss of vision, she denies any history such as syncope.  There is no history of any lower extremity edema. There has been no recent weight gain or weight loss.  PHYSICAL EXAMINATION:  GENERAL:  She is well built and mildly obese.  She appears to be in no acute distress.  VITAL SIGNS:  Include a temperature of 98.6, pulse is 106 per minute which is regular.  Respirations 14 per minute.  Blood pressure 170/95 mmHg.  CARDIAC:  S1, S2 is normal.  There is a S4 gallop heard.  There is no murmur.  LUNGS:   Bilaterally equal, there are no crackles.  ABDOMEN:  Benign bulge of all four quadrants.  EXTREMITIES:  Revealed no evidence of edema.  Peripheral vascular examination: Shows all pulses to be 3+ and equal.  The blood pressure in both upper extremities is the same.  There is no radial, fluid, or delay.  PERTINENT FINDINGS:  EKG demonstrates sinus tachycardia at rate of 127 bpm, normal axis not associated secondary to ventricular conduction delay.  Left atrial abnormality.  This was done on November 15, 2000, and a repeat EKG done this afternoon reveals T wave inversion in V1, and V2 which is new  compared to  the prior EKG.  Her total CPK was 1,018, MB of 15.9, and index of 1.6.  Troponin is 1.11. Please note  Her chest x-ray revealed no evidence of mediastinal enlargement.  Her V/Q scan was of note probably for pulmonary embolus. DD:  11/16/00 TD:  11/17/00 Job: [id number]

## 2010-08-31 NOTE — Consult Note (Signed)
[place]. [facility]  Patient:    Brooke Stafford, Brooke Stafford                     MRN: [id number] Proc. Date: 11/18/00 Adm. Date:  [id number] Attending:  Sharrell Ku CC:         Kela Millin, M.D.  Elyn Peers, M.D.  Betti D. Ayesha Rumpf, M.D.   Consultation Report  REQUESTING PHYSICIAN:  Kela Millin, M.D.  PRIMARY CARE PHYSICIAN:  Elyn Peers, M.D.  REASON FOR CONSULTATION:  Severe three vessel coronary artery disease with moderate left ventricular dysfunction status post acute non Q-wave myocardial infarction with class IV congestive heart failure.  HISTORY OF PRESENT ILLNESS:  Brooke Stafford is a 55 year old obese African-American female from [place] with no previous cardiac history, but risk factors including hypertension, hyperlipidemia, obesity, long-standing tobacco abuse, and a family history of coronary artery disease.  She presents with symptoms of severe shortness of breath at rest associated with heaviness and tightness across her chest as well as symptoms of orthopnea and progressive symptoms of dyspnea on exertion over the last six months.  She was admitted to the hospital and ruled in for an acute non Q-wave myocardial infarction with positive cardiac enzymes.  Her peak total CPK was 1018 and her CK-MB fraction was 15.9 with a peak troponin I of 1.11.  She also initially underwent a chest CT scan to rule out the possibility of pulmonary embolus. This demonstrated findings consistent with pulmonary edema.  Consultation with cardiology was obtained and the patient underwent elective cardiac catheterization by Dr. Einar Gip.  This demonstrated severe three vessel coronary artery disease with moderate left ventricular dysfunction.  There is elevated left ventricular end diastolic pressure.  Cardiac surgical consultation is requested.  REVIEW OF SYSTEMS:  GENERAL:  The patient reports no generalized complaints other than she has been  under a lot of stress.  She reports good appetite with no significant weight change recently.  CARDIAC:  Notable for symptoms of atypical stabbing chest pain as well as heaviness across her chest which have come and gone over the last several months.  She states that these episodes of chest pain are more pronounced with strenuous activity but they have also occurred at rest.  She has had problems with progressive dyspnea on exertion and recently has had episodes of severe shortness of breath at rest which prompted her hospital admission.  She has had a cough productive of frothy pink white sputum recently.  She has had occasional palpitations and dizzy spells, but reports no syncopal episodes.  She has not had lower extremity edema.  She has had problems with two to three pillow orthopnea.  RESPIRATORY: Notable for shortness of breath and cough as previously described.  She denies any clear history of frank hemoptysis.  GASTROINTESTINAL:  Negative.  The patient has had reasonably good appetite.  She does have occasional constipation and occasional diarrhea.  She denies any hematemesis, hematochezia, or melena.  HEENT:  Notable for a chronic subcutaneous cyst in the preauricular area on the right which occasionally gets inflamed, but has not been recently.  She treats it with topical medications.  She also notes that she has poor dentition and has had several teeth pulled.  She states that she has several teeth with cavities that need to be addressed.  GENITOURINARY: Negative.  NEUROLOGIC:  Negative.  She specifically denies any problems with transient monocular blindness or transient numbness or weakness involving either upper  or lower extremities.  ENDOCRINE:  Negative.  Patient denies any known history of diabetes.  Diabetes does run in her family.  PSYCHIATRIC: Notable for the fact that the patient admits to having suffered from chronic stress.  HEMATOLOGIC:  Negative.  Patient denies any  problems with easy bleeding, frequent epistaxis, or easy bruising.  MUSCULOSKELETAL:  Negative. INFECTIOUS:  Negative.  Patient denies any fevers or chills.  PAST MEDICAL HISTORY:  As stated previously and notable for the absence of any documented history of coronary artery disease or congestive heart failure prior to this admission.  She denies any known history of diabetes or previous stroke.  She has newly appreciated hypertension and hypercholesterolemia.  She has a long-standing history of tobacco abuse.  PAST SURGICAL HISTORY:  Notable for previous cholecystectomy, previous cesarean section, and previous excision of a benign breast cyst.  FAMILY HISTORY:  Notable in that her mother suffered from a myocardial infarction at age 54.  Diabetes runs in her family as well.  There is no history of early onset coronary artery disease.  SOCIAL HISTORY:  The patient is divorced and lives with her boyfriend who is her fiance here in [place].  She works as a Regulatory affairs officer.  She smokes one pack of cigarettes per day.  She denies history of excessive alcohol consumption.  MEDICATIONS:  None.  ALLERGIES:  PENICILLIN.  The patient states she gets hives and a rash.  PHYSICAL EXAMINATION  GENERAL:  Well appearing, obese African-American female who appears her stated age in no acute distress.  General appearance is otherwise well.  VITAL SIGNS:  She is slightly hypertensive with blood pressure 145/65.  She is in normal sinus rhythm by monitor.  She is afebrile.  Oxygen saturation is 97% on room air.  Weight 242 pounds with baseline height of 5 feet 11 inches.  She is obese.  HEENT:  Poor dentition.  She has a sebaceous cyst in the right preauricular area which is not inflamed.  NECK:  Supple.  There is no cervical or supraclavicular lymphadenopathy. There is no jugular venous distention.  No carotid bruits are noted.   CHEST:  Clear and symmetrical breath sounds bilaterally.  No wheezes  or rhonchi are noted.  CARDIOVASCULAR:  Regular rate and rhythm.  No murmurs, rubs, or gallops are noted.  ABDOMEN:  Obese, but soft and nontender.  No masses are identified.  The liver edge is not palpable.  EXTREMITIES:  Warm and well perfused.  Pulses are palpable in all four extremities.  Her left ulnar pulse is not palpable to my examination.  There is sluggish capillary refill on radial pulse compression consistent with a positive Allen test.  GENITOURINARY:  Deferred.  NEUROLOGIC:  Grossly nonfocal.  SKIN:  Healthy and well appearing.  LYMPH:  No lymphadenopathy.  LABORATORIES:  Cardiac catheterization films are reviewed.  These demonstrate severe three vessel coronary artery disease with moderate left ventricular dysfunction.  Specifically, there is diffuse coronary disease with 50% proximal stenosis of the left anterior descending coronary artery and 90% proximal stenosis of the first diagonal branch.  There is 60-70% proximal stenosis of a large ramus intermedius branch, 60% proximal stenosis of the left circumflex.  There is 70% proximal stenosis of the large first circumflex marginal branch.  There is 90% stenosis of the left circumflex after takeoff of the circumflex marginal branch.  There is 100% distal occlusion of the right coronary artery with collateral filling of the posterior descending and posterolateral branches through both right  and left to right collaterals. Left ventricular function is moderately reduced with ejection fraction estimated 40-45%.  Left ventricular end diastolic pressure was noted at 45 mmHg at the time of catheterization.  There is inferior wall akinesis and severe inferolateral hypokinesis.  There is no mitral regurgitation.  Baseline anemia with hemoglobin 9.9 and hematocrit 29.9% measured yesterday. Patient has hypercholesterolemia with a total cholesterol 201, HDL 44, LDL 120, triglycerides 183.  Her glycosylated hemoglobin was  6.1.  She has had some blood sugars which have been mildly elevated.  She has normal renal function.  She has a low serum albumin of 3.2.  Her TSH level was normal.  IMPRESSION:  Severe three vessel coronary artery disease with moderate left ventricular dysfunction associated with class IV congestive heart failure following acute non Q-wave myocardial infarction.  I believe that Brooke Stafford would best be treated with elective coronary artery bypass grafting.  Her proximal left anterior descending coronary artery disease is significant, although not high grade and this will put her at increased risk for problems in the future.  I believe that her left anterior descending coronary artery stenosis is certainly a high enough grade to require grafting at the time of surgery but because of competitive flow she may be at increased risk for early graft failure or atresia of the left internal mammary artery. Nevertheless, I believe that her best long-term treatment is with surgical revascularization.  I have outlined at length the indications, risks, and potential benefits of coronary artery bypass grafting with Brooke Stafford and her fiance.  They understand the associated risks of surgery including, but not limited to, risk of death, stroke, myocardial infarction, respiratory failure, congestive failure, arrhythmia, bleeding requiring blood transfusion, infection, and recurrent coronary artery disease.  She will be at considerably increased risk for need for blood products due to her anemia which is present preoperatively.  Because of her young age and relatively diffuse distal coronary artery disease she will be at somewhat increased risk for recurrent coronary artery disease in the future.  She will absolutely need to quit smoking.  She may be a borderline diabetic and this will need to be watched carefully.  She needs to lose weight.  All these issues have been discussed  at length.  PLAN:  We tentatively plan to proceed with surgery first case Thursday, August 8.  All their questions have been addressed. DD:  11/18/00 TD:  11/19/00 Job: [id number]

## 2010-08-31 NOTE — Cardiovascular Report (Signed)
NAME:  Brooke Stafford, Brooke Stafford             ACCOUNT NO.:  [id number]   MEDICAL RECORD NO.:  [id number]          PATIENT TYPE:  OIB   LOCATION:  2899                         FACILITY:  [facility]   PHYSICIAN:  Eden Lathe. Einar Gip, MD       DATE OF BIRTH:  04-04-1956   DATE OF PROCEDURE:  10/04/2004  DATE OF DISCHARGE:                              CARDIAC CATHETERIZATION   REFERRING PHYSICIAN:  Lucianne Lei, M.D.   PROCEDURE PERFORMED:  1.  Left ventriculography.  2.  Selective left coronary arteriography.  3.  Saphenous vein bypass and left internal mammary arteriography.  4.  Percutaneous transluminal coronary angiography and stenting of the ramus      intermedius.   INDICATIONS:  Ms. Brooke Stafford is a 55 year old African-American female with a  history of hypertension, hyperlipidemia, diabetes out of control, morbid  obesity who has been having increased shortness of breath and chest  discomfort.  She underwent a Cardiolite stress test on 08/28/04 and this had  revealed inferolateral wall ischemia.  In addition, we could not rule out  anteroseptal ischemia.  Her ejection fraction was normal at 58%.  Given her  risk factors and her symptoms suggestive of angina pectoris, she was brought  to the cardiac catheterization lab for definitive diagnosis of coronary  artery disease.   HEMODYNAMIC DATA:  The left ventricular pressure was 136/12 with end-  diastolic pressure of 17 mmHg.  The aortic pressures were 138/70 with a mean  of 97 mmHg.  There was no pressure gradient across the aortic valve.   ANGIOGRAPHIC DATA:  Left ventricle:  The left ventricular systolic function  was normal with an ejection fraction estimated at around 55%.  There was no  significant mitral regurgitation.   Right coronary artery:  This is a moderate to large caliber vessel.  It has  diffuse disease.  Distally, it is occluded and is vascularized by bridging  collaterals.  Also the distal bed is supplied by the saphenous vein  graft.   Left main:  The left main is mildly calcific.  There is a distal 20-30%  stenosis.   LAD:  The LAD has an ostial 30% stenosis.  The LAD is profusely diseased  distally.  The diagonal to it which was large is occluded and is not seen.  However, this vessel is supplied by the saphenous vein graft.  There is  compensated filling noted in the distal LAD from the LIMA.   Ramus intermedius:  The ramus intermedius is a large caliber vessel with an  ostial calcific 80% stenosis.   Circumflex:  The circumflex is a moderate sized vessel which is diffusely  diseased.  There is an ostial 70% calcific stenosis.   SVG to the PDA is widely patent.   SVG to D2 is widely patent.   SVG to radius intermedius is occluded.   The LIMA to the LAD is patent.   IMPRESSION:  1.  Normal LV systolic function, ejection fraction 55%.  2.  Patent grafts except for occluded saphenous vein graft to ramus      intermedius which is a very large  vessel.  3.  The circumflex is diffusely diseased, although it has an ostial stenosis      this is not significantly changed and appears to be calcific.  The fact      that the ramus intermedius is a large vessel, this probably is the      culprit.   INTERVENTION DATA:  Successful PTCA and stenting of the ramus intermedius  branch with a 2.75 x 16 mm Taxus deployed at 56 atmospheres of pressure.  The stenosis was reduced from 80% to 0%.  Overall, this gave a 3.08-mm  lumen.   I was unable to wire the calcific circumflex.  However, there was no plaque  shift and there was no change in the anatomy post stent of the radius  intermedius.  Given the fact that there was an acute angle, in spite of  multiple wires, I was able to cross into the circumflex.  I highly doubt  that I will be able to get any kind of stents or balloons in there, hence  this procedure was abandoned.   RECOMMENDATIONS:  1.  Concur with risk factor modification as indicated, especially  control of      diabetes.  Weight loss is indicated.  2.  If she does continue to have recurrent chest discomfort, consideration      can be given for EECP.   A total of 280 cc of contrast was utilized both for diagnostic and  intervention procedures.   TECHNIQUE OF PROCEDURE:  Under usual sterile conditions, using a 6 French  right femoral artery access, a 6 [place] multipurpose B2 catheter was  advanced from the ascending aorta over the 0.035 J-wire.  The catheter was  applied to the left ventricle pressure monitor and hand contrast injection  was performed in both the LAO and RAO projections and then the catheter was  pulled back in the ascending aorta and PG across the AV was monitored.  The  right coronary artery was selectively engaged and then the left coronary  artery was selectively engaged, and angiography was performed.  The same  catheter was utilized to engage the SVG to the PDA, SVG to the diagonal and  SVG to the ramus.  Then the catheter was pulled out in usual fashion, and a  5-French LIMA catheter was used to engage the LIMA to LAD and angiography  was performed.   TECHNIQUE OF INTERVENTION:  A 7-French SL4 guide was utilized for  interventional procedure.  Angiomax was utilized for anticoagulation.  After  engaging the left main in the usual fashion, a 190 cm x 0.014 inch Asahi  Prowater wire was passed into the ramus intermedius.  This lesion was then  predilated with a 2.5 x 15 mm Express at 56 atmospheres of pressure for 56  seconds.  Post balloon inflation the balloon was withdrawn and because of  haziness in the ostium it was decided to proceed with stenting.  A 2.74 x 16  mm Taxus stent was advanced into the ramus intermedius.  I did have  difficulty pushing the stent through the ramus intermedius because of heavy  calcification.  After confirming that this was at the ostium of the ramus intermedius, the stent was gradually deployed to a maximum of 56  atmospheres  of pressure for 56 seconds and the balloon was deflated, pulled back in a  guiding catheter.  Then 12 mcg of nitroglycerin was administered and  angiography was performed.  Excellent results were noted.  However, because  of revascularization of the ramus, the circumflex appeared to be more  stenosed  angiographically.  Hence, I decided to see if I could potentially  stent a 2.5-mm stent into the circumflex. Hence, I attempted to cross into  circumflex with an ATW guidewire.  I was unable to do so.  I left the ATW  guidewire at the ostium of the circumflex and then a PT2 light support wire  was utilized and again I attempted to cross the circumflex.  Again, I was  unable to cross this, although I was able to penetrate the ostium.  Because  of fear of  dissection, noticing the fact that this was a small vessel with diffuse  disease and calcification, I felt that the stent or any other device would  not potentially go through this and hence I abandoned the procedure.  Overall, the patient tolerated the procedure well and no immediate  complications were noted.       JRG/MEDQ  D:  10/04/2004  T:  10/04/2004  Job:  [id number]   cc:   Lucianne Lei, M.D.  [facility] [address], [address]  [place]  [place] [address]  Fax: [phone]

## 2010-08-31 NOTE — Procedures (Signed)
Brooke Stafford. [facility]  Patient:    Brooke Stafford, Brooke Stafford                MRN: [id number] Proc. Date: 11/20/00 Adm. Date:  [id number] Attending:  Darylene Price CC:         Anesthesia Department   Procedure Report  PROCEDURE:  Transesophageal echocardiography.  INDICATION FOR PROCEDURE:  Ms. Brooke Stafford is a 55 year old female patient of Dr. Lucianne Stafford, who presents today for coronary artery bypass grafting by Dr. Darylene Price.  It has been requested by cardiac surgery that we place the TEE probe for evaluation of cardiac structures and function during this operative procedure, which is to be coronary artery bypass grafting.  DESCRIPTION OF PROCEDURE:  The patient arrives to the holding area, where under local anesthesia and sedation central access is obtained due to no peripheral access.  A pulmonary artery catheter is also placed in the right internal jugular.  The patient is taken to the operating room for routine induction of general anesthesia.  The trachea is intubated.  The TEE probe is then lubricated and passed oropharyngeally to the stomach and then withdrawn for imaging of cardiac structures.  Pre-cardiopulmonary bypass TEE examination:  Left ventricle:  There is concentric left ventricular hypertrophy graded at moderate to severe.  In the short axis view, papillary muscles are well-outlined.  In the short axis view, overall contractility is somewhat diminished, with anterior wall hypokinetic portion at the area of the septum.  There is also inferior wall hypokinesis noted.  All segments in the short axis view appear to be, however, contractile with inward movement during systolic ejection.  Long axis view again reveals mild hypokinesis noted in that inferior wall and an area in a portion of the septal area that is hypokinetic as well.  The papillary muscles, as mentioned previously, are well-outlined in both long and short axis views.  There are  no other masses noted.  As you take the TEE upwards into the left ventricular outflow tract and just in the subaortic area, you see the thickened portion of the left ventricular wall as it moves into the membranous portion.  Here there reveals a very significant tapering of the muscular wall as it approaches the membranous area just beneath the aortic valve.  It is almost a fingerlike projection in the membranous tip, which would be the membranous portion of the LV, and LV outflow tract appears very thin, such that there is just a small membrane separating the right and left ventricles.  On interrogation of this with Doppler examination in multiple views, we were not able to demonstrate any diversion of slow or any sort of shunt activity.  The membranes, therefore, appeared to be intact.  Mitral valve:  This was a mildly thickened mitral valve apparatus, but overall function appeared to be normal and such that it appeared to coapt below the level of the annulus.  Coaptation was satisfactory such that on Doppler examination there was really no significant or any mitral regurgitant flow noted.  The left atrium:  This is a normal left atrial chamber.  The interatrial septum was also interrogated in its length in entirety, also with Doppler examination, and again did not reveal any shunts or any defects in the interatrial septum.  Aortic valve:  Very mild aortic sclerosis is noted.  There are three cups. They open well during systolic contraction and close appropriately.  On Doppler examination, there is no obstruction to flow, nor is there  any aortic insufficiency noted.  Right ventricle:  This is a normal right ventricular chamber.  Tricuspid valve:  Normal, compliant, mobile tricuspid valve.  Appears to be functioning normally.  There is a pulmonary artery catheter across this valve.  Left atrium:  Normal left atrial chamber.  Pulmonary artery catheter is noted within.  The  patient is placed on cardiopulmonary bypass, hypothermia is instituted. Coronary artery bypass grafting is carried out by Dr. Roxy Manns.  The patient is rewarmed and attempted to separate from cardiopulmonary bypass.  Post-cardiopulmonary bypass TEE examination:  Left ventricle:  In the early bypass period on ultrasound exam, there appeared markedly diminished left ventricular chamber function as well as a near-akinetic right ventricular chamber.  Small air bubbles were noted, these would be considered microbubbles on ultrasound exam, appeared in the left ventricular outflow tract just below the three cusps of the aortic valve.  It was unclear as to how these bubbles gained entrance other than the vent that was used.  No other shunts were apparent.  The patient was returned to cardiopulmonary bypass for resting of the myocardium, inotropes were instituted, and the patient prepared for separation yet again.  Left ventricle:  Over time and with the institution of inotropes, left ventricular contractility did improve, with all segments appearing mobile. Again, the inferior and a portion of the anterior wall appeared hypocontractile.  Overall it was as previously described in the pre-bypass period and appeared to be improved with time, separation from cardiopulmonary bypass, and with the institution of inotropes.  The right ventricle:  What was early an akinetic right ventricular chamber after a period of time suddenly improved and then showed good contractility, good filling, and good function.  During the time when the heart appeared stunned, there was probably 2-3+ tricuspid regurgitant flow noted on exam. Multiple imagings were carried out all throughout the septal areas that had been previously described, again looking for any sort of shunts or any areas that might allow transfer of any microbubbles or other.  This was not able to be seen, both at the membranous level of the interventricular  septum and at the interatrial septal level.  Over a period of time, cardiac function had  continued to improve.  The patient remained stable and was transported to the cardiac intensive care unit in stable condition. DD:  11/20/00 TD:  11/21/00 Job: [id number]

## 2010-08-31 NOTE — Discharge Summary (Signed)
NAME:  Brooke Stafford, Brooke Stafford             ACCOUNT NO.:  [id number]   MEDICAL RECORD NO.:  [id number]          PATIENT TYPE:  OIB   LOCATION:  6526                         FACILITY:  [facility]   PHYSICIAN:  Eden Lathe. Einar Gip, MD       DATE OF BIRTH:  1956/01/08   DATE OF ADMISSION:  10/04/2004  DATE OF DISCHARGE:  10/05/2004                                 DISCHARGE SUMMARY   DISCHARGE DIAGNOSES:  1.  Chest pain with abnormal Cardiolite study, catheterization this      admission with native ramus intermedius TAXUS stenting.  2.  Coronary artery bypass grafting August 2002, with occluded saphenous      vein graft to ramus intermedius this admission.  3.  Treated hyperlipidemia.  4.  Non-insulin dependent diabetes.  5.  Treated hypertension.   HOSPITAL COURSE:  Patient is a 55 year old female followed by Dr. Einar Gip with  a history of coronary artery bypass grafting August of 2002.  She has been  having some chest tightness associated with some dyspnea.  She had a  Cardiolite study Aug 27, 2004, that showed good LV function with  inferolateral wall ischemia and some mild anterior septal ischemia.  She was  admitted for diagnostic catheterization on October 04, 2004.  This revealed a  patent SVG to the RCA, patent LIMA to the LAD, patent SVG to the diagonal  and occluded SVG to the ramus intermedius.  The ramus intermedius had an 80%  proximal lesion which he dilated and stented with a TAXUS stent.  The  circumflex had a 70% calcific lesion after that, that was unchanged.  Dr.  Einar Gip did attempt to cross into the circumflex but was unable to.  Overall,  he felt her coronary disease actually looked better in all vessels compared  to previous catheterization November 17, 2000.  Plan is for continued medical  therapy as well as weight loss and if her symptoms recur she will be  considered for EECP.  We feel she can be discharged October 05, 2004.  We did  cut her Toprol back to 50 mg a day because of bradycardia  and some fatigue.   DISCHARGE MEDICATIONS:  1.  Toprol XL 50 mg a day.  2.  Enteric coated aspirin once a day.  3.  Accupril 40 mg a day.  4.  Hydrochlorothiazide 25 mg a day.  5.  Metformin 500 mg twice a day, she will start this Sunday.  6.  Amaryl 2 mg a day.  7.  Crestor 20 mg a day.  8.  Clonidine 0.2 mg b.i.d.  9.  Nitroglycerin sublingual p.r.n.  10. Plavix 75 mg once a day.   LABORATORY DATA:  At discharge, her white count is 4.4, hemoglobin 10.9,  hematocrit 32.1, platelets 227.  Sodium 138, potassium 4.0, BUN 8,  creatinine 1.2.  INR prior to admission was 0.8.   Chest x-ray shows post CABG no active disease.   EKG shows sinus rhythm with inferior T-wave inversion.   DISPOSITION:  The patient is discharged in stable condition.  She will  follow up with Dr. Einar Gip  in a week or so.  She will need a follow-up  hemoglobin and hematocrit.       LKK/MEDQ  D:  10/05/2004  T:  10/05/2004  Job:  [id number]   cc:   Lucianne Lei, M.D.  [phone], [address]  [place]  [place] [address]  Fax: [phone]

## 2010-08-31 NOTE — Op Note (Signed)
[place]. [facility]  Patient:    Brooke Stafford, Brooke Stafford                MRN: [id number] Proc. Date: 11/20/00 Adm. Date:  [id number] Attending:  Darylene Price CC:         Kela Millin, M.D.  Elyn Peers, M.D.   Operative Report  PREOPERATIVE DIAGNOSIS:  Severe three-vessel coronary artery disease, status post acute non-Q-wave myocardial infarction.  POSTOPERATIVE DIAGNOSIS:  severe three-vessel coronary artery disease, status post acute non-Q-wave myocardial infarction.  PROCEDURES:  Median sternotomy for coronary artery bypass grafting x 6 (left internal mammary artery to distal left anterior descending coronary artery, saphenous vein graft to first diagonal branch, saphenous vein graft to ramus intermediate branch and sequential saphenous vein graft to circumflex marginal branch, saphenous vein graft to posterior descending coronary and sequential saphenous vein graft to first right posterolateral branch).  SURGEON:  Valentina Gu. Roxy Manns, M.D.  ASSISTANT:  Lynnell Catalan, P.A.  ANESTHESIA:  General.  BRIEF CLINICAL NOTE:  Patient is a 55 year old obese African-American female with hypertension, hyperlipidemia, tobacco abuse, and new-onset borderline diabetes mellitus, who presents with a six-month history of progressive angina and suffered an acute non-Q-wave myocardial infarction complicated by class IV congestive heart failure.  The patient was admitted to the hospital and underwent consultation by Dr. Einar Gip.  Cardiac catheterization demonstrates severe three-vessel coronary artery disease with moderate left ventricular dysfunction.  A full surgical consultation was performed, and a consultation note has been dictated previously (dictation [phone]).  DESCRIPTION OF PROCEDURE:  Following full informed consent by the patient and her significant other, the patient is brought to the operating room on the above-mentioned date for surgery as described.   Central venous monitoring is established by the anesthesia service under the care and direction of Ala Dach, M.D.  Initial pulmonary artery pressures with the patient in the awake state were notable for a PA pressure of 60/35.  General endotracheal anesthesia is induced uneventfully.  Intravenous antibiotics are administered. Following induction with anesthesia, transesophageal echocardiogram is performed by Dr. Orene Desanctis.  This demonstrates moderate to severe left ventricular hypertrophy with severe diastolic dysfunction.  There is moderate left ventricular dysfunction with overall ejection fraction estimated 40%. There is some anterior septal and severe inferior hypokinesis.  There is no mitral regurgitation.  There is mild aortic insufficiency.  The aortic valve is otherwise normal.  There is significant right ventricular dysfunction appreciated with a moderately dilated right ventricle.  There is also an unusual appearance of the infundibular septum, which is very thin.  However, after very careful and close examination, there is clearly no sign of any supracristal ventricular septal defect, as there is no sign of any blood flow across this region.  In addition, there is no sign of a patent foramen ovale or other interatrial communication.  No other abnormalities are noted.  The patients chest, abdomen, both groins, and both lower extremities are prepared and draped in a sterile manner.  A median sternotomy incision is performed, and the left internal mammary artery is dissected from the chest wall and prepared for bypass grafting.  The left internal mammary artery is felt to be good-quality conduit.  Simultaneously saphenous vein is obtained from the patients right lower extremity through a series of longitudinal incisions.  The saphenous vein is felt to be good-quality conduit.  The patient is heparinized systemically.  The pericardium is opened.  There is biventricular  enlargement of the heart with moderate to  severe left ventricular hypertrophy and a dilated right ventricle.  Ascending aorta is normal in appearance.  The ascending aorta and the right atrial appendage are cannulated for cardiopulmonary bypass. Adequate heparinization is verified.  Cardiopulmonary bypass is begun, and the surface of the heart is inspected. There is significant biventricular hypertrophy with severe left ventricular hypertrophy and significant right ventricular hypertrophy as well.  There is significant scarring in the inferior wall from previous myocardial infarction. Distal sites are selected for coronary bypass grafting.  Portions of saphenous vein and the left internal mammary artery are all trimmed to appropriate lengths.  A temperature probe is placed in the left ventricular septum, and a Styrofoam pad is placed to protect the left phrenic nerve from thermal injury. Cardioplegia catheter is placed in the ascending aorta.  The patient is cooled to 32 degrees systemic temperature.  The aortic crossclamp is applied, and cardioplegia is delivered in antegrade fashion through the aortic root.  The iced saline slush is applied for topical hypothermia.  The initial cardioplegic arrest and myocardial cooling are felt to be excellent.  Repeat doses of cardioplegia are administered intermittently throughout the crossclamp portion of the operation both through the aortic root and down subsequently-placed vein grafts to maintain septal temperature below 15 degrees Centigrade.  The following distal coronary anastomoses are performed:  (1) The posterior descending coronary artery is grafted with a saphenous vein graft in a side-to-side fashion using a side branch off the vein.  This coronary measured 1.5 mm in diameter and is of fair quality.  (2) The first posterolateral branch off the distal right coronary artery is grafted with a sequential saphenous vein graft using the vein  placed to the  posterior descending coronary artery.  This coronary measures 1.2 mm in diameter and is of fair quality.  The second posterolateral branch off the distal right coronary artery is felt to be too small for grafting.  (3) The ramus intermediate branch is grafted with a saphenous vein graft in the side-to-side fashion.  This coronary measures 2 mm in diameter and is of fair to good quality.  It is intramyocardial.  (4) The circumflex marginal branch is grafted using a sequential saphenous vein graft off one of the veins placed to the ramus intermediate branch.  This coronary measures 1.4 mm in diameter and is of fair quality.  (5) The first diagonal branch off the left anterior descending coronary artery is grafted with a saphenous vein graft in an end-to-side fashion.  This coronary measures 1.5 mm in diameter and is of good quality.  (6) The distal left anterior descending coronary artery is grafted with the left internal mammary artery in an end-to-side fashion.  This coronary measures 1.6 mm in diameter and is of good quality.  All three proximal saphenous vein anastomoses are performed directly to the ascending aorta prior to removal of the aortic crossclamp.  The septal temperature is noted to rise rapidly and dramatically upon reperfusion of the left internal mammary artery.  The patient is placed in Trendelenburg position, and all air is evacuated from the aortic root.  The aortic crossclamp is removed after a total crossclamp time of 96 minutes.  The heart begins to beat spontaneously without need for cardioversion.  All proximal and distal anastomoses are inspected for hemostasis and appropriate graft orientation.  Epicardial pacing wires are affixed to the right ventricular outflow tract and to the right atrial appendage.  The patient is rewarmed to greater than 37 degrees Centigrade temperature.  Weaning from cardiopulmonary bypass is initiated.  However, upon onset  of ejection from the heart, a significant amount of air was noted to have become present in the saphenous vein graft going to the first diagonal branch and to the proximal right coronary circulation, consistent with air embolization.  The patient is kept in Trendelenburg position, and cardiopulmonary bypass is reinstituted.  A vent is placed in the aortic root to facilitate further de-airing. Transesophageal echocardiogram is utilized and is notable for the findings of significant air bubbles stuck along the left ventricular interventricular septum as well as in the left atrium itself.  A variety of maneuvers is performed to extract all air.  The patient is started on dopamine and loaded with milrinone infusions.  Ultimately the patient is weaned from cardiopulmonary bypass without difficulty.  The patients rhythm at separation from bypass is normal sinus rhythm.  Total cardiopulmonary bypass time for the operation is 162 minutes.  Follow-up transesophageal echocardiogram performed by Dr. Orene Desanctis after separation from bypass confirms no significant change in left ventricular function.  The venous and arterial cannulae are removed uneventfully.  Protamine is administered to reverse the anticoagulation.  The mediastinum and the left chest are irrigated with saline solution containing vancomycin.  Meticulous surgical hemostasis is ascertained.  The mediastinum and the left chest are drained with three chest tubes placed through separate stab incisions inferiorly.  The median sternotomy is closed in a routine fashion.  The right lower extremity incision is closed in multiple layers in routine fashion.  All skin incisions are closed in subcuticular skin closures.  The patient tolerated the procedure well.  The patient was transfused one unit packed red blood cells during cardiopulmonary bypass due to anemia, with hematocrit of 18%.  There are no intraoperative complications.  All  sponge, instrument, and needle counts are verified correct at completion of the operation. DD:  11/20/00 TD:  11/21/00 Job: [id number]

## 2010-08-31 NOTE — Cardiovascular Report (Signed)
[place]. [facility]  Patient:    Brooke Stafford, Brooke Stafford                     MRN: [id number] Proc. Date: 11/17/00 Adm. Date:  [id number] Attending:  Sharrell Ku CC:         Elyn Peers, M.D.  [facility]   Cardiac Catheterization  PROCEDURES PERFORMED: 1. Selective left ventriculography. 2. Selective right and left coronary arteriography. 3. Bilateral selective renal arteriography. 4. Right femoral arteriography. 5. Closure of right femoral artery access site using 6-French Perclose    device.  INDICATIONS:  Brooke Stafford is a 55 year old African-American female with a longstanding history of hypertension, who had not been on any medical therapy and medical followup, who presents to the hospital complaining of chest pain and shortness of breath.  She was found to have abnormal CPK and troponin and she was brought to the cardiac catheterization laboratory to evaluate her coronary anatomy especially due to her progressive symptoms of chest pain and shortness of breath.  HEMODYNAMIC DATA: 1. The left ventricular pressures were 204/20, with end-diastolic pressure of    45 mmHg. 2. The central aortic pressures were 181/109, with a mean of 142 mmHg. 3. There was no pressure gradient across the left ventricle to aorta, across    the aortic valve.  ANGIOGRAPHIC DATA: Left ventricle:  The left ventricular systolic function is mildly decreased. The ejection fraction is estimated at 45%.  There is inferior and inferolateral wall hypokinesis.  Left main coronary artery:  The left main coronary artery is a large caliber vessel.  It has distal about 10-20% stenosis.  It trifurcates into left anterior descending, circumflex, and intermediate coronary artery.  Left anterior descending:  The left anterior descending artery is a large caliber vessel.  It gives origin to a moderate sized diagonal #1 lesion which has about 90% ostial  stenosis.  The left anterior descending artery continues and ends just after wrapping around the apex.  The left anterior descending artery also gives origin to several septal perforators which supply collaterals to the distal right coronary artery.  The LAD has no significant stenosis.  Circumflex artery:  The circumflex artery is a nondominant vessel.  It is moderate caliber (2.0 x 2.25 mm).  It has a long 90% mid circumflex stenosis. It extends from the proximal to mid segment, all the way to the distal end of the mid segment.  At the stenotic site, it also gives origin to a medium sized obtuse marginal #1, which has ostial 20-30% stenosis.  The distal circumflex continues and gives type 3 collaterals to the distal right coronary artery.  Right coronary artery:  The right coronary artery is a moderate caliber vessel (2.75 to 3.0 mm).  In the mid segment, it has about 30-40% stenosis. Distally, it is 100% occluded and fills up the PLV and PDA by bridging collaterals.  Renal arteriography:  Selective renal arteriography was performed both on left and right kidneys.  There is no evidence of significant stenosis of the renal arteries.  There is good pattern blush.  The kidney size appeared to be normal.  IMPRESSIONS: 1. Mild decrease in left ventricular systolic function.  Ejection fraction of    40-45%, with inferior and inferolateral wall hypokinesis. 2. Totally occluded distal right coronary artery which is filled by bridging    collaterals.  Large posterolateral artery and large posterior descending    artery.  These  are mostly filled by the collaterals from the circumflex and    left anterior descending artery. 3. High-grade stenosis of the mid circumflex lesion, which is a long segment    lesion.  The circumflex is about 2.0 x 2.25 mm vessel. 4. High-grade stenosis of the intermediate coronary artery.  It is a moderate    caliber vessel (2.75 mm). 5. High-grade stenosis of the  diagonal #1 vessel which has ostial 90%    stenosis.  It is a moderate caliber vessel (2.5 x 2.75 mm).  RECOMMENDATIONS: 1. At this time, the patient will be taken off the cardiac catheterization    table to discuss regarding further options regarding therapy.  Coronary    artery bypass grafting and percutaneous coronary intervention both can be    contemplated and hence discussion will be held with the patient. 2. Aggressive control of hypertension is indicated. 3. Smoking cessation is indicated.  TECHNIQUE OF PROCEDURE:  Under the usual sterile precautions, a 6-French right femoral artery access, a 6-French multiple B2 catheter was advanced into the ascending aorta over a 0.035 mm J-wire.  The catheter was gently advanced to the left ventricle and left ventricular pressures were monitored.  Then hand contrast injection of the left ventricle was performed both in the RAO and LAO projections.  Then the catheter was flushed with saline and pulled back into the ascending aorta and pressure gradient across the aortic valve was monitored.  The catheter was then pulled out of the body in the usual fashion and a 6-French Judkins diagnostic right catheter was advanced into the ascending aorta over a 0.035 mm J-wire and the right coronary artery was selectively engaged.  Arteriography was performed.  Then the catheter was pulled out of the body in the usual fashion after having cannulated the right and left renal arteries selectively.  Then the catheter was exchanged to a Judkins left catheter.  The left main coronary artery was selectively engaged and arteriography was performed.  After obtaining adequate views, the catheter was pulled out of the body in the usual fashion.  Then right femoral arteriography was performed in the RAO projection.  After confirming the good arterial access site, the access site was closed with a Perclose device with excellent hemostasis obtained.  The patient was  transferred to the floor in stable condition.  DD:  11/17/00 TD:  11/18/00 Job: [id number]

## 2011-01-03 LAB — URINALYSIS, ROUTINE W REFLEX MICROSCOPIC
Glucose, UA: NEGATIVE
Ketones, ur: NEGATIVE
Nitrite: NEGATIVE
Specific Gravity, Urine: 1.023
pH: 5

## 2011-01-03 LAB — CBC
MCHC: 33.9
MCV: 94.1
Platelets: 268
RBC: 4.27

## 2011-01-03 LAB — COMPREHENSIVE METABOLIC PANEL
ALT: 22
AST: 20
Albumin: 3.9
CO2: 21
Calcium: 9.2
Creatinine, Ser: 1.81 — ABNORMAL HIGH
GFR calc Af Amer: 36 — ABNORMAL LOW
Sodium: 135
Total Protein: 7.9

## 2011-01-03 LAB — LIPASE, BLOOD: Lipase: 29

## 2011-01-03 LAB — DIFFERENTIAL
Eosinophils Absolute: 0.3
Eosinophils Relative: 5
Lymphocytes Relative: 35
Lymphs Abs: 1.9
Monocytes Absolute: 0.4
Monocytes Relative: 8

## 2011-01-03 LAB — URINE MICROSCOPIC-ADD ON

## 2011-01-05 ENCOUNTER — Inpatient Hospital Stay (HOSPITAL_COMMUNITY)
Admission: EM | Admit: 2011-01-05 | Discharge: 2011-01-09 | DRG: 683 | Disposition: A | Discharge status: Home Health | Payer: Medicare Other | Attending: Internal Medicine | Admitting: Internal Medicine

## 2011-01-05 ENCOUNTER — Emergency Department (HOSPITAL_COMMUNITY): Payer: Medicare Other

## 2011-01-05 DIAGNOSIS — I252 Old myocardial infarction: Secondary | ICD-10-CM

## 2011-01-05 DIAGNOSIS — Z9119 Patient's noncompliance with other medical treatment and regimen: Secondary | ICD-10-CM

## 2011-01-05 DIAGNOSIS — I129 Hypertensive chronic kidney disease with stage 1 through stage 4 chronic kidney disease, or unspecified chronic kidney disease: Principal | ICD-10-CM | POA: Diagnosis present

## 2011-01-05 DIAGNOSIS — I2510 Atherosclerotic heart disease of native coronary artery without angina pectoris: Secondary | ICD-10-CM | POA: Diagnosis present

## 2011-01-05 DIAGNOSIS — I200 Unstable angina: Secondary | ICD-10-CM | POA: Diagnosis present

## 2011-01-05 DIAGNOSIS — Z91199 Patient's noncompliance with other medical treatment and regimen due to unspecified reason: Secondary | ICD-10-CM

## 2011-01-05 DIAGNOSIS — Z96659 Presence of unspecified artificial knee joint: Secondary | ICD-10-CM

## 2011-01-05 DIAGNOSIS — Z794 Long term (current) use of insulin: Secondary | ICD-10-CM

## 2011-01-05 DIAGNOSIS — Z7982 Long term (current) use of aspirin: Secondary | ICD-10-CM

## 2011-01-05 DIAGNOSIS — I25810 Atherosclerosis of coronary artery bypass graft(s) without angina pectoris: Secondary | ICD-10-CM | POA: Diagnosis present

## 2011-01-05 DIAGNOSIS — N182 Chronic kidney disease, stage 2 (mild): Secondary | ICD-10-CM | POA: Diagnosis present

## 2011-01-05 DIAGNOSIS — E119 Type 2 diabetes mellitus without complications: Secondary | ICD-10-CM | POA: Diagnosis present

## 2011-01-05 DIAGNOSIS — L723 Sebaceous cyst: Secondary | ICD-10-CM | POA: Diagnosis present

## 2011-01-05 LAB — CBC
MCH: 32 pg (ref 26.0–34.0)
MCV: 89.5 fL (ref 78.0–100.0)
Platelets: 239 10*3/uL (ref 150–400)
RDW: 13.5 % (ref 11.5–15.5)
WBC: 5.1 10*3/uL (ref 4.0–10.5)

## 2011-01-05 LAB — BASIC METABOLIC PANEL
BUN: 20 mg/dL (ref 6–23)
Calcium: 10.3 mg/dL (ref 8.4–10.5)
GFR calc Af Amer: 48 mL/min — ABNORMAL LOW (ref >60)
GFR calc non Af Amer: 40 mL/min — ABNORMAL LOW (ref >60)
Glucose, Bld: 249 mg/dL — ABNORMAL HIGH (ref 70–99)

## 2011-01-05 LAB — POCT I-STAT TROPONIN I: Troponin i, poc: 0 ng/mL (ref 0.00–0.08)

## 2011-01-05 LAB — DIFFERENTIAL
Basophils Relative: 1 % (ref 0–1)
Eosinophils Absolute: 0.5 10*3/uL (ref 0.0–0.7)
Eosinophils Relative: 9 % — ABNORMAL HIGH (ref 0–5)
Lymphs Abs: 1.4 10*3/uL (ref 0.7–4.0)
Monocytes Relative: 7 % (ref 3–12)

## 2011-01-05 LAB — COMPREHENSIVE METABOLIC PANEL
ALT: 20 U/L (ref 0–35)
AST: 20 U/L (ref 0–37)
Albumin: 3.9 g/dL (ref 3.5–5.2)
Alkaline Phosphatase: 86 U/L (ref 39–117)
Chloride: 99 mEq/L (ref 96–112)
Potassium: 3.8 mEq/L (ref 3.5–5.1)
Sodium: 137 mEq/L (ref 135–145)
Total Bilirubin: 0.3 mg/dL (ref 0.3–1.2)

## 2011-01-05 LAB — PRO B NATRIURETIC PEPTIDE: Pro B Natriuretic peptide (BNP): 948.6 pg/mL — ABNORMAL HIGH (ref 0–125)

## 2011-01-05 LAB — GLUCOSE, CAPILLARY: Glucose-Capillary: 319 mg/dL — ABNORMAL HIGH (ref 70–99)

## 2011-01-05 LAB — APTT: aPTT: 30 seconds (ref 24–37)

## 2011-01-06 ENCOUNTER — Inpatient Hospital Stay (HOSPITAL_COMMUNITY): Payer: Medicare Other

## 2011-01-06 LAB — CBC
HCT: 33 % — ABNORMAL LOW (ref 36.0–46.0)
Hemoglobin: 11.5 g/dL — ABNORMAL LOW (ref 12.0–15.0)
MCH: 31.5 pg (ref 26.0–34.0)
MCHC: 34.8 g/dL (ref 30.0–36.0)
RBC: 3.65 MIL/uL — ABNORMAL LOW (ref 3.87–5.11)

## 2011-01-06 LAB — HEMOGLOBIN A1C
Hgb A1c MFr Bld: 9.7 % — ABNORMAL HIGH (ref <5.7)
Mean Plasma Glucose: 232 mg/dL — ABNORMAL HIGH (ref <117)

## 2011-01-06 LAB — CARDIAC PANEL(CRET KIN+CKTOT+MB+TROPI)
CK, MB: 2.5 ng/mL (ref 0.3–4.0)
CK, MB: 2.3 ng/mL (ref 0.3–4.0)
Relative Index: INVALID (ref 0.0–2.5)
Total CK: 85 U/L (ref 7–177)
Troponin I: <0.3 ng/mL (ref <0.30)

## 2011-01-06 LAB — LIPID PANEL
Cholesterol: 238 mg/dL — ABNORMAL HIGH (ref 0–200)
HDL: 33 mg/dL — ABNORMAL LOW (ref >39)
LDL Cholesterol: UNDETERMINED mg/dL (ref 0–99)
Triglycerides: 403 mg/dL — ABNORMAL HIGH (ref <150)

## 2011-01-06 LAB — BASIC METABOLIC PANEL
BUN: 21 mg/dL (ref 6–23)
CO2: 30 mEq/L (ref 19–32)
Calcium: 10.2 mg/dL (ref 8.4–10.5)
GFR calc non Af Amer: 41 mL/min — ABNORMAL LOW (ref >60)
Glucose, Bld: 255 mg/dL — ABNORMAL HIGH (ref 70–99)
Potassium: 4 mEq/L (ref 3.5–5.1)
Sodium: 137 mEq/L (ref 135–145)

## 2011-01-06 LAB — HEPARIN LEVEL (UNFRACTIONATED): Heparin Unfractionated: 0.19 IU/mL — ABNORMAL LOW (ref 0.30–0.70)

## 2011-01-06 LAB — GLUCOSE, CAPILLARY: Glucose-Capillary: 401 mg/dL — ABNORMAL HIGH (ref 70–99)

## 2011-01-06 NOTE — H&P (Signed)
NAMEMarland Kitchen  TRULEE, BATES NO.:  [id number]  MEDICAL RECORD NO.:  [id number]  LOCATION:  [facility]                         FACILITY:  [place]  PHYSICIAN:  Mali Kristopher Delk, MD         DATE OF BIRTH:  Apr 04, 1956  DATE OF ADMISSION:  01/05/2011 DATE OF DISCHARGE:                             HISTORY & PHYSICAL   CHIEF COMPLAINT:  Chest pain.  HISTORY OF PRESENT ILLNESS:  A 55 year old African American female with history of bypass grafting 10 years ago with 1 known occluded graft and last cath in May 2011 with patent vein graft to the distal RCA, patent vein graft to the diagonal of the LAD, patent LIMA to the LAD, and widely patent stent in the ostium of the proximal portion of the ramus intermediate vessel which was done after the graft occluded.  On the cath in May, she was medically treated and the cath was done after she was found unresponsive in her bathroom, was intubated, sedated, profoundly hyperglycemic, and significantly hypertensive.  She had become noncompliant with her medications that led to that admission. She was treated for possible pneumonia and [employer] protocol.  Her mental status improved and she was discharged.  She now presents with chest pain for 1 week that would come and go, sharp shooting pains but these were similar to the pain she had prior to her bypass grafting.  She also had acute heart failure with bypass grafting on admission as well 10 years ago.  Last night and this morning her pain increased.  She did not have any nitroglycerin at home to take, so she was admitted.  Here she had nitro with relief.  Her pain went into her back and into her neck.  Blood pressure was elevated 201/90. She states now that her blood pressures have been up for at least a week, she states she has been taking her medications.  She had been walking until this chest pain started occurring.  She denies shortness of breath.  There is a mild pleuritic component of the  pain as well.  PAST MEDICAL HISTORY:  Coronary disease with bypass grafting as stated, diabetes mellitus type 2 insulin dependent, hypertension poorly controlled, currently actually malignant hypertension tonight.  She has had a non-ST-elevation MI with the respiratory failure in May 2011.  At that time, she also developed acute renal failure secondary to sepsis which resolved.  She has hyperlipidemia, history of depression.  She has peripheral neuropathy due to the diabetes.  She also has arthritis in her left knee and had left total knee arthroplasty in November 2011 by Dr. Wynelle Link.  Other history does include blood clots back in 1998 after hysterectomy, history of uterine cancer, cecectomy, cholecystectomy, and hysterectomy.  FAMILY HISTORY:  Father died at age 70 of a heart attack.  Mother died at 16 with cancer.  There is also hypertension in the family.  ALLERGIES:  PENICILLIN.  SOCIAL HISTORY:  Stopped tobacco 10 years ago.  She is divorced, 2 children, 2 grandchildren, attempted to exercise every other day.  REVIEW OF SYSTEMS:  CARDIOVASCULAR:  Increased blood pressure, chest pain today and over the last week.  PULMONARY:  No waking from sleep with shortness of breath.  GENERAL:  History of insomnia.  No colds or fevers.  GI:  No diarrhea, constipation, or melena.  GU:  No hematuria or dysuria.  ENDOCRINE:  She is diabetic.  She states her glucose never runs higher than 300 but frequently at 300.  MUSCULOSKELETAL: Arthroplasty was to be done November 27 due to severe arthritis.  OUTPATIENT MEDICATIONS: 1. Lantus 60 units daily. 2. Metformin 1000 mg twice a day. 3. Amaryl 4 mg twice a day. 4. Aspirin 81 mg daily. 5. Clonidine 0.2 mg x3 a day t.i.d. 6. Benazepril 20 mg daily. 7. Imdur 30 mg daily. 8. Norvasc 10 mg x1. 9. Metoprolol 200 mg daily assuming that is XL. 10.HCTZ 25 mg daily. 11.Zoloft 100 mg daily. 12.Neurontin 400 mg daily. 13.Zocor 40 mg  daily. 14.Tramadol 50 mg p.r.n. 15.Trazodone 50 mg daily. 16.Vitamin D 1.25 mg every week. 17.Zantac 150 mg daily. 18.Loratadine 10 mg daily.  PHYSICAL EXAMINATION:  VITAL SIGNS:  Today blood pressure 201/90, currently down to 131/76, pulse 70, respirations 19, temp 98.6, oxygen saturation 98%. GENERAL:  Alert, oriented African American female, pleasant affect.  No pain currently. SKIN:  Warm and dry.  Brisk capillary refill. HEENT:  Glasses are in place.  She has fluctuating mass in the right periauricular area.  Also has dentures. NECK:  Supple.  No cervical adenopathy.  Does have tenderness down into her right cheek. HEART:  Regular rate and rhythm.  S1, S2.  No S3.  No murmur or gallops. LUNGS:  Clear. ABDOMEN:  Soft, nontender, positive bowel sounds, obese. No masses. EXTREMITIES:  Trace bilateral edema, 1 to 2+ pedal pulses. NEURO:  Alert and oriented x3.  Moves all extremities.  Follows commands.  Weight 228, height 5 feet 10 inches.  Hemoglobin 12.5, hematocrit 35, platelets 239,000,  WBC 5.1, troponin I 0.00.  Sodium 137, potassium 4.4, BUN 20, creatinine 1.38, calcium 10.3.  EKG:  Sinus rhythm at 68, no acute changes.  Nonspecific lateral T-wave flattening.  CHEST X-RAY:  No acute cardiopulmonary disease.  IMPRESSION: 1. Chest pain with typical and atypical features. 2. Hypertensive urgency, i.e., malignant hypertension with chest pain. 3. Coronary disease with history of bypass grafting 10 years ago,     known graft dysfunction with occluded vein graft to the     intermediate ramus and stent to the ramus.  Other grafts were     patent and last cath May 2011. 4. History of noncompliance with hypertension and uncontrolled     diabetes. 5. Diabetes mellitus type 2 insulin dependent. 6. Arthritis.  PLAN:  Admit to telemetry bed as the patient is currently pain free.  We will add nitroglycerin paste tonight and change back to Imdur tomorrow. She has no further  chest pain.  We will add IV heparin tonight, cardiac enzymes serially, continue her statin, continue her beta-blocker as well as her ACE inhibitor.  Depending on laboratory data, decision will be made concerning stress test.  Most likely this is related to her malignant hypertension, but we will wait on laboratory data for further decision making.     Otilio Carpen. Dorene Ar, N.P.   ______________________________ Mali Janaya Broy, MD    LRI/MEDQ  D:  01/05/2011  T:  01/05/2011  Job:  [id number]  cc:   Dr. Jobe Igo  Electronically Signed by Cecilie Kicks N.P. on 01/06/2011 11:53:25 AM Electronically Signed by Raliegh Ip. Eligha Kmetz M.D. on 01/06/2011 03:13:23 PM

## 2011-01-07 ENCOUNTER — Other Ambulatory Visit (HOSPITAL_COMMUNITY): Payer: Medicaid Other

## 2011-01-07 LAB — CBC
HCT: 31.2 % — ABNORMAL LOW (ref 36.0–46.0)
Hemoglobin: 10.9 g/dL — ABNORMAL LOW (ref 12.0–15.0)
MCH: 31.2 pg (ref 26.0–34.0)
MCHC: 34.9 g/dL (ref 30.0–36.0)
MCV: 89.4 fL (ref 78.0–100.0)
RDW: 13.2 % (ref 11.5–15.5)

## 2011-01-07 LAB — HEPARIN LEVEL (UNFRACTIONATED): Heparin Unfractionated: 0.42 [IU]/mL (ref 0.30–0.70)

## 2011-01-08 LAB — BASIC METABOLIC PANEL
CO2: 30 mEq/L (ref 19–32)
Chloride: 100 mEq/L (ref 96–112)
GFR calc non Af Amer: 41 mL/min — ABNORMAL LOW (ref >60)
Glucose, Bld: 253 mg/dL — ABNORMAL HIGH (ref 70–99)
Potassium: 3.9 mEq/L (ref 3.5–5.1)
Sodium: 136 mEq/L (ref 135–145)

## 2011-01-08 LAB — CBC
HCT: 32.8 % — ABNORMAL LOW (ref 36.0–46.0)
Hemoglobin: 11.3 g/dL — ABNORMAL LOW (ref 12.0–15.0)
MCHC: 34.5 g/dL (ref 30.0–36.0)
RBC: 3.6 MIL/uL — ABNORMAL LOW (ref 3.87–5.11)
WBC: 5.4 10*3/uL (ref 4.0–10.5)

## 2011-01-08 LAB — GLUCOSE, CAPILLARY
Glucose-Capillary: 237 mg/dL — ABNORMAL HIGH (ref 70–99)
Glucose-Capillary: 324 mg/dL — ABNORMAL HIGH (ref 70–99)
Glucose-Capillary: 418 mg/dL — ABNORMAL HIGH (ref 70–99)

## 2011-01-09 LAB — BASIC METABOLIC PANEL
Chloride: 101 mEq/L (ref 96–112)
GFR calc Af Amer: 53 mL/min — ABNORMAL LOW (ref >60)
GFR calc non Af Amer: 44 mL/min — ABNORMAL LOW (ref >60)
Potassium: 3.8 mEq/L (ref 3.5–5.1)
Sodium: 139 mEq/L (ref 135–145)

## 2011-01-09 LAB — CBC
MCHC: 34.6 g/dL (ref 30.0–36.0)
RDW: 13.3 % (ref 11.5–15.5)
WBC: 5.4 10*3/uL (ref 4.0–10.5)

## 2011-01-09 LAB — MAGNESIUM: Magnesium: 1.8 mg/dL (ref 1.5–2.5)

## 2011-01-09 LAB — GLUCOSE, CAPILLARY
Glucose-Capillary: 204 mg/dL — ABNORMAL HIGH (ref 70–99)
Glucose-Capillary: 335 mg/dL — ABNORMAL HIGH (ref 70–99)

## 2011-01-10 LAB — CBC
HCT: 36.8
Hemoglobin: 12.4
MCHC: 33.6
MCV: 93.7
RBC: 3.93

## 2011-01-10 LAB — URINALYSIS, ROUTINE W REFLEX MICROSCOPIC
Bilirubin Urine: NEGATIVE
Nitrite: NEGATIVE
Specific Gravity, Urine: 1.034 — ABNORMAL HIGH
pH: 5

## 2011-01-10 LAB — POCT I-STAT, CHEM 8
BUN: 22
Chloride: 103
Glucose, Bld: 413 — ABNORMAL HIGH
HCT: 38
Potassium: 4.2

## 2011-01-10 LAB — DIFFERENTIAL
Basophils Relative: 1
Eosinophils Absolute: 0.4
Eosinophils Relative: 7 — ABNORMAL HIGH
Monocytes Absolute: 0.3
Monocytes Relative: 5
Neutro Abs: 3.5

## 2011-01-10 LAB — URINE MICROSCOPIC-ADD ON

## 2011-01-10 LAB — POCT CARDIAC MARKERS: CKMB, poc: 2.5

## 2011-01-17 NOTE — Discharge Summary (Signed)
NAMEMarland Kitchen  Brooke, Stafford NO.:  [id number]  MEDICAL RECORD NO.:  [id number]  LOCATION:  2030                         FACILITY:  [facility]  PHYSICIAN:  Rolland Porter, MD DATE OF BIRTH:  03/23/1956  DATE OF ADMISSION:  01/05/2011 DATE OF DISCHARGE:  01/09/2011                              DISCHARGE SUMMARY   DISCHARGE DIAGNOSES: 1. Unstable angina and cardiac enzymes negative x3. 2. Coronary artery disease, remote coronary artery bypass graft, last     left heart catheterization was in May 2011, and with plans for     medical treatment. 3. Hypertension. 4. Medical noncompliance. 5. Diabetes mellitus, controlled. 6. Dyslipidemia. 7. Sebaceous cyst approximately 2 cm in size in the preauricular     region.  HOSPITAL COURSE:  Brooke Stafford is a 55 year old African American female who was overweight.  She had a history of bypass grafting 10 years ago with one occluded graft on the last cath in May 2011, patent vein graft to the distal RCA, patent vein graft to the diagonal of the LAD, and patent LIMA to the LAD, widely patent stent in the ostium of the proximal portion of the ramus intermediate vessel, this was done after the graft occluded.  Her history also includes diabetes mellitus type 2, poorly controlled hypertension, non-ST-elevation MI with respiratory failure in May 2011, acute renal failure, hyperlipidemia, depression, peripheral myopathy, arthritis in her left knee and total knee arthroplasty in November 2011, history of blood clots back in 1998 after hysterectomy, and history of uterine cancer, cystectomy, cholecystectomy and hysterectomy.  She presented with 1 week of chest pain with sharp and was similar to prior pain when she had her bypass grafting.  She presented to [facility] ED.  She was given some nitroglycerin, which provided relief.  At that time, her chest pain was radiating to her back and her neck.  Blood pressure was elevated at 201/90.  She also  stated her blood pressure has been elevated for last week.  She was admitted to the telemetry bed in the painfree status.  She was given nitroglycerin paste, started back on Imdur, and she was started on IV heparin. Cardiac enzymes checked serially, continued on stain, beta-blocker and ACE inhibitor.  She is on a modest improvement of blood pressure.  She did have elimination of her chest pain and dyspnea.  ACE inhibitor was increased.  Nutrition education and counseling was provided as well as diabetes education.  The patient tolerated the increase in ACE inhibitor without change in creatinine and continued without chest pain throughout her hospital stay.  Currently states she feels better, less stress.  She did have some right pectoral tenderness, which comes and goes, and was noticed increased with movement.  This is getting better she states. She has been seen by Dr. Ellyn Hack feels she is stable for discharge home with an increase in spirolactone to 25 mg t.i.d. and Imdur to 90 mg daily.  She will follow up at [facility] for outpatient lower extremity arterial Dopplers and ABIs as well as renal ultrasound.  She will also be provided with skilled nursing home health.  DISCHARGE LABS:  WBCs 5.4, hemoglobin 12.1, hematocrit 35.0,  platelets 216.  Sodium 139, potassium 3.8, chloride 101, carbon dioxide 29, glucose 255, BUN 19, creatinine 1.27, calcium 10.8 with a magnesium of 1.8.  Hemoglobin A1c is 9.7.  Cardiac enzymes were negative x3.  Initial BNP was 948.  Total cholesterol 238, triglycerides 403, HDL 33, total cholesterol-HDL ratio was 7.2.  TSH was 1.538.  STUDIES/PROCEDURES: 1. Chest x-ray on January 05, 2011, showed stable mild cardiomegaly     without pulmonary edema.  There is little linear scarring in the     inferior left upper lobe.  No acute cardiopulmonary process. 2. Ultrasound of the soft tissue in the head on January 06, 2011,     showed  imaging consistent with approximately 2-cm complex sebaceous     cyst, increased in size since prior CT in May 2011.  DISCHARGE MEDICATIONS: 1. Benazepril 40 mg one tablet by mouth daily. 2. Insulin aspart 100 units/mL. 3. FlexPen 1-20 units subcutaneously three times a day with meals and     2-5 units at bedtime. 4. Imdur 30 mg tablets, three tablets by mouth daily. 5. Lantus 60 units subcutaneously daily at bedtime. 6. Nitroglycerin sublingual 0.4 mg one tablet under the tongue every 5     minutes up to three doses for chest pain. 7. Spironolactone 25 mg one tablet by mouth twice daily. 8. Advil 20 mg one tablet by mouth every 8 hours as needed for pain. 9. Amaryl 4 mg one tablet by mouth twice daily. 10.Artificial tears one to two drops in both eyes daily as needed for     dry itchy eyes. 11.Aspirin enteric coated 81 mg one tablet by mouth daily. 12.Amlodipine 10 mg one tablet by mouth daily. 13.Calcium carbonate over-the-counter one tablet by mouth weekly. 14.Clonidine 0.2 mg one tablet by mouth three times a day. 15.Gabapentin 400 mg one tablet by mouth daily at bedtime. 16.Hydrochlorothiazide 25 mg one tablet by mouth daily. 17.Loratadine 10 mg one tablet by mouth daily. 18.Metformin 1000 mg one tablet by mouth twice a day. 19.ProAir inhaler two puffs inhaled every 4 hours as needed for     shortness of breath. 20.Simvastatin 40 mg one tablet by mouth daily at bedtime. 21.Toprol-XL 200 mg one tablet by mouth daily. 22.Tramadol 50 mg one tablet by mouth three times a day as needed for     pain. 23.Trazodone 50 mg one tablet by mouth daily at bedtime. 24.Vitamin D2 50,000 units one capsule by mouth weekly, which is on     Tuesdays. 25.Zantac 150 mg one tablet by mouth daily as needed for acid reflux. 26.Zoloft 100 mg one tablet by mouth daily.  DISPOSITION:  Brooke Stafford will be discharged home in stable conditions. It was recommended she increase her activity slowly.  She is  to eat a low-sodium, heart-healthy, low-carbohydrate diet.  She will follow up with Dr. Sallyanne Kuster on February 13, 2011 at 11:30 a.m.  She will follow up at [facility] for renal and lower extremity Dopplers on February 08, 2011 at 2 p.m.    ______________________________ Tarri Fuller, PA   ______________________________ Rolland Porter, MD    BH/MEDQ  D:  01/09/2011  T:  01/09/2011  Job:  [id number]  cc:   Sanda Klein, MD Monia Sabal. Jobe Igo, M.D.  Electronically Signed by Tarri Fuller PA on 01/16/2011 03:36:05 PM Electronically Signed by Glenetta Hew MD on 01/17/2011 09:01:40 AM

## 2011-01-23 ENCOUNTER — Other Ambulatory Visit: Payer: Self-pay | Admitting: Internal Medicine

## 2011-01-23 DIAGNOSIS — D249 Benign neoplasm of unspecified breast: Secondary | ICD-10-CM

## 2011-02-07 ENCOUNTER — Other Ambulatory Visit: Payer: Medicaid Other

## 2011-03-16 DIAGNOSIS — I739 Peripheral vascular disease, unspecified: Secondary | ICD-10-CM

## 2011-03-16 HISTORY — DX: Peripheral vascular disease, unspecified: I73.9

## 2011-03-28 ENCOUNTER — Encounter (HOSPITAL_COMMUNITY): Payer: Self-pay | Admitting: Pharmacy Technician

## 2011-04-04 ENCOUNTER — Other Ambulatory Visit: Payer: Self-pay | Admitting: Cardiovascular Disease

## 2011-04-05 ENCOUNTER — Encounter (HOSPITAL_COMMUNITY)
Admission: RE | Disposition: A | Discharge status: Home / Self Care | Payer: Self-pay | Source: Ambulatory Visit | Attending: Cardiovascular Disease

## 2011-04-05 ENCOUNTER — Encounter (HOSPITAL_COMMUNITY): Payer: Self-pay | Admitting: *Deleted

## 2011-04-05 ENCOUNTER — Ambulatory Visit (HOSPITAL_COMMUNITY)
Admission: RE | Admit: 2011-04-05 | Discharge: 2011-04-06 | Disposition: A | Discharge status: Home / Self Care | Payer: Medicare Other | Source: Ambulatory Visit | Attending: Cardiovascular Disease | Admitting: Cardiovascular Disease

## 2011-04-05 DIAGNOSIS — E785 Hyperlipidemia, unspecified: Secondary | ICD-10-CM | POA: Insufficient documentation

## 2011-04-05 DIAGNOSIS — Z794 Long term (current) use of insulin: Secondary | ICD-10-CM | POA: Insufficient documentation

## 2011-04-05 DIAGNOSIS — I739 Peripheral vascular disease, unspecified: Secondary | ICD-10-CM | POA: Diagnosis present

## 2011-04-05 DIAGNOSIS — I70219 Atherosclerosis of native arteries of extremities with intermittent claudication, unspecified extremity: Secondary | ICD-10-CM | POA: Insufficient documentation

## 2011-04-05 DIAGNOSIS — I2510 Atherosclerotic heart disease of native coronary artery without angina pectoris: Secondary | ICD-10-CM | POA: Insufficient documentation

## 2011-04-05 DIAGNOSIS — I10 Essential (primary) hypertension: Secondary | ICD-10-CM | POA: Insufficient documentation

## 2011-04-05 DIAGNOSIS — E119 Type 2 diabetes mellitus without complications: Secondary | ICD-10-CM | POA: Insufficient documentation

## 2011-04-05 DIAGNOSIS — Z951 Presence of aortocoronary bypass graft: Secondary | ICD-10-CM

## 2011-04-05 HISTORY — DX: Atherosclerotic heart disease of native coronary artery without angina pectoris: I25.10

## 2011-04-05 HISTORY — DX: Pneumonia, unspecified organism: J18.9

## 2011-04-05 HISTORY — DX: Anxiety disorder, unspecified: F41.9

## 2011-04-05 HISTORY — DX: Peripheral vascular disease, unspecified: I73.9

## 2011-04-05 HISTORY — PX: ABDOMINAL AORTAGRAM: SHX5454

## 2011-04-05 HISTORY — PX: RENAL ANGIOGRAM: SHX5509

## 2011-04-05 HISTORY — DX: Heart failure, unspecified: I50.9

## 2011-04-05 HISTORY — DX: Gastro-esophageal reflux disease without esophagitis: K21.9

## 2011-04-05 HISTORY — DX: Essential (primary) hypertension: I10

## 2011-04-05 HISTORY — DX: Anemia, unspecified: D64.9

## 2011-04-05 HISTORY — DX: Acute myocardial infarction, unspecified: I21.9

## 2011-04-05 LAB — CREATININE, SERUM: GFR calc Af Amer: 48 mL/min — ABNORMAL LOW (ref >90)

## 2011-04-05 LAB — GLUCOSE, CAPILLARY
Glucose-Capillary: 370 mg/dL — ABNORMAL HIGH (ref 70–99)
Glucose-Capillary: 272 mg/dL — ABNORMAL HIGH (ref 70–99)

## 2011-04-05 LAB — POCT ACTIVATED CLOTTING TIME
Activated Clotting Time: 204 seconds
Activated Clotting Time: 231 seconds

## 2011-04-05 SURGERY — ABDOMINAL AORTAGRAM
Anesthesia: LOCAL

## 2011-04-05 MED ORDER — FAMOTIDINE 20 MG PO TABS
20.0000 mg | ORAL_TABLET | Freq: Every day | ORAL | Status: DC
  Administered 2011-04-05 – 2011-04-06 (×2): 20 mg via ORAL
  Filled 2011-04-05 (×2): qty 1

## 2011-04-05 MED ORDER — BENAZEPRIL HCL 20 MG PO TABS
20.0000 mg | ORAL_TABLET | Freq: Every day | ORAL | Status: DC
  Administered 2011-04-05 – 2011-04-06 (×2): 20 mg via ORAL
  Filled 2011-04-05 (×2): qty 1

## 2011-04-05 MED ORDER — CLONIDINE HCL 0.2 MG PO TABS
0.2000 mg | ORAL_TABLET | Freq: Three times a day (TID) | ORAL | Status: DC
  Administered 2011-04-05 – 2011-04-06 (×3): 0.2 mg via ORAL
  Filled 2011-04-05 (×5): qty 1

## 2011-04-05 MED ORDER — SODIUM CHLORIDE 0.9 % IV SOLN
INTRAVENOUS | Status: DC
  Administered 2011-04-05: 1000 mL via INTRAVENOUS

## 2011-04-05 MED ORDER — OXYCODONE-ACETAMINOPHEN 5-325 MG PO TABS
1.0000 | ORAL_TABLET | ORAL | Status: DC | PRN
  Administered 2011-04-05: 2 via ORAL
  Filled 2011-04-05: qty 2

## 2011-04-05 MED ORDER — NITROGLYCERIN IN D5W 200-5 MCG/ML-% IV SOLN
5.0000 ug/min | INTRAVENOUS | Status: DC
  Administered 2011-04-05: 5 ug/min via INTRAVENOUS

## 2011-04-05 MED ORDER — INSULIN ASPART 100 UNIT/ML ~~LOC~~ SOLN
0.0000 [IU] | SUBCUTANEOUS | Status: DC
  Administered 2011-04-05: 9 [IU] via SUBCUTANEOUS

## 2011-04-05 MED ORDER — ALPRAZOLAM 0.25 MG PO TABS
0.2500 mg | ORAL_TABLET | Freq: Once | ORAL | Status: AC
  Administered 2011-04-05: 0.25 mg via ORAL

## 2011-04-05 MED ORDER — ASPIRIN EC 325 MG PO TBEC: 325.0000 mg | DELAYED_RELEASE_TABLET | Freq: Every day | ORAL | Status: DC

## 2011-04-05 MED ORDER — ROSUVASTATIN CALCIUM 10 MG PO TABS
10.0000 mg | ORAL_TABLET | Freq: Every day | ORAL | Status: DC
  Administered 2011-04-05: 10 mg via ORAL
  Filled 2011-04-05 (×2): qty 1

## 2011-04-05 MED ORDER — MORPHINE SULFATE 2 MG/ML IJ SOLN: 1.0000 mg | INTRAMUSCULAR | Status: DC | PRN

## 2011-04-05 MED ORDER — AMLODIPINE BESYLATE 10 MG PO TABS
10.0000 mg | ORAL_TABLET | Freq: Every day | ORAL | Status: DC
  Administered 2011-04-05 – 2011-04-06 (×2): 10 mg via ORAL
  Filled 2011-04-05 (×2): qty 1

## 2011-04-05 MED ORDER — LORATADINE 10 MG PO TABS
10.0000 mg | ORAL_TABLET | Freq: Every day | ORAL | Status: DC
  Administered 2011-04-06: 10 mg via ORAL
  Filled 2011-04-05 (×2): qty 1

## 2011-04-05 MED ORDER — ACETAMINOPHEN 325 MG PO TABS: 650.0000 mg | ORAL_TABLET | ORAL | Status: DC | PRN

## 2011-04-05 MED ORDER — ALBUTEROL SULFATE HFA 108 (90 BASE) MCG/ACT IN AERS
2.0000 | INHALATION_SPRAY | Freq: Four times a day (QID) | RESPIRATORY_TRACT | Status: DC | PRN
  Filled 2011-04-05: qty 6.7

## 2011-04-05 MED ORDER — CLOPIDOGREL BISULFATE 75 MG PO TABS
75.0000 mg | ORAL_TABLET | Freq: Every day | ORAL | Status: DC
  Administered 2011-04-06: 75 mg via ORAL
  Filled 2011-04-05: qty 1

## 2011-04-05 MED ORDER — TRAMADOL HCL 50 MG PO TABS
50.0000 mg | ORAL_TABLET | Freq: Three times a day (TID) | ORAL | Status: DC | PRN
  Filled 2011-04-05: qty 1

## 2011-04-05 MED ORDER — METOPROLOL SUCCINATE ER 100 MG PO TB24
200.0000 mg | ORAL_TABLET | Freq: Every day | ORAL | Status: DC
  Administered 2011-04-05 – 2011-04-06 (×2): 200 mg via ORAL
  Filled 2011-04-05 (×2): qty 2

## 2011-04-05 MED ORDER — INSULIN GLARGINE 100 UNIT/ML ~~LOC~~ SOLN
60.0000 [IU] | Freq: Every day | SUBCUTANEOUS | Status: DC
  Administered 2011-04-05: 60 [IU] via SUBCUTANEOUS
  Filled 2011-04-05: qty 3

## 2011-04-05 MED ORDER — SPIRONOLACTONE 25 MG PO TABS
25.0000 mg | ORAL_TABLET | Freq: Two times a day (BID) | ORAL | Status: DC
  Administered 2011-04-05 – 2011-04-06 (×3): 25 mg via ORAL
  Filled 2011-04-05 (×4): qty 1

## 2011-04-05 MED ORDER — ALPRAZOLAM 0.25 MG PO TABS
0.2500 mg | ORAL_TABLET | Freq: Two times a day (BID) | ORAL | Status: DC | PRN
  Administered 2011-04-05: 0.25 mg via ORAL
  Filled 2011-04-05 (×2): qty 1

## 2011-04-05 MED ORDER — ASPIRIN 81 MG PO TABS
81.0000 mg | ORAL_TABLET | Freq: Every day | ORAL | Status: DC
  Filled 2011-04-05 (×3): qty 1

## 2011-04-05 MED ORDER — TRAZODONE HCL 50 MG PO TABS
50.0000 mg | ORAL_TABLET | Freq: Every day | ORAL | Status: DC
  Filled 2011-04-05 (×2): qty 1

## 2011-04-05 MED ORDER — GLIMEPIRIDE 4 MG PO TABS
4.0000 mg | ORAL_TABLET | Freq: Two times a day (BID) | ORAL | Status: DC
  Administered 2011-04-05 – 2011-04-06 (×2): 4 mg via ORAL
  Filled 2011-04-05 (×3): qty 1

## 2011-04-05 MED ORDER — NITROGLYCERIN IN D5W 200-5 MCG/ML-% IV SOLN
INTRAVENOUS | Status: AC
  Administered 2011-04-05: 5 ug/min via INTRAVENOUS
  Filled 2011-04-05: qty 250

## 2011-04-05 MED ORDER — CALCIUM CARBONATE 1250 (500 CA) MG PO CAPS
1250.0000 mg | ORAL_CAPSULE | Freq: Two times a day (BID) | ORAL | Status: DC
  Filled 2011-04-05 (×4): qty 1

## 2011-04-05 MED ORDER — DIAZEPAM 5 MG PO TABS
5.0000 mg | ORAL_TABLET | ORAL | Status: AC
  Administered 2011-04-05: 5 mg via ORAL
  Filled 2011-04-05: qty 1

## 2011-04-05 MED ORDER — FLUTICASONE PROPIONATE 50 MCG/ACT NA SUSP
2.0000 | Freq: Every day | NASAL | Status: DC
Start: 2011-04-06 — End: 2011-04-06
  Filled 2011-04-05: qty 16

## 2011-04-05 MED ORDER — SODIUM CHLORIDE 0.9 % IJ SOLN: 3.0000 mL | INTRAMUSCULAR | Status: DC | PRN

## 2011-04-05 MED ORDER — ONDANSETRON HCL 4 MG/2ML IJ SOLN: 4.0000 mg | Freq: Four times a day (QID) | INTRAMUSCULAR | Status: DC | PRN

## 2011-04-05 MED ORDER — VITAMIN D (ERGOCALCIFEROL) 1.25 MG (50000 UNIT) PO CAPS: 50000.0000 [IU] | ORAL_CAPSULE | ORAL | Status: DC

## 2011-04-05 MED ORDER — HYDROCHLOROTHIAZIDE 25 MG PO TABS
25.0000 mg | ORAL_TABLET | Freq: Every day | ORAL | Status: DC
  Administered 2011-04-05 – 2011-04-06 (×2): 25 mg via ORAL
  Filled 2011-04-05 (×2): qty 1

## 2011-04-05 MED ORDER — INSULIN ASPART 100 UNIT/ML ~~LOC~~ SOLN
10.0000 [IU] | Freq: Three times a day (TID) | SUBCUTANEOUS | Status: DC
  Administered 2011-04-06: 25 [IU] via SUBCUTANEOUS
  Filled 2011-04-05: qty 3

## 2011-04-05 MED ORDER — CITALOPRAM HYDROBROMIDE 20 MG PO TABS
20.0000 mg | ORAL_TABLET | Freq: Every day | ORAL | Status: DC
  Administered 2011-04-05 – 2011-04-06 (×2): 20 mg via ORAL
  Filled 2011-04-05 (×2): qty 1

## 2011-04-05 MED ORDER — SIMVASTATIN 40 MG PO TABS: 40.0000 mg | ORAL_TABLET | Freq: Every day | ORAL | Status: DC

## 2011-04-05 MED ORDER — NITROGLYCERIN 0.4 MG SL SUBL: 0.4000 mg | SUBLINGUAL_TABLET | SUBLINGUAL | Status: DC | PRN

## 2011-04-05 MED ORDER — HYDRALAZINE HCL 20 MG/ML IJ SOLN
10.0000 mg | INTRAMUSCULAR | Status: DC | PRN
  Administered 2011-04-05: 10 mg via INTRAVENOUS
  Filled 2011-04-05 (×2): qty 0.5

## 2011-04-05 MED ORDER — SODIUM CHLORIDE 0.9 % IV SOLN: INTRAVENOUS | Status: DC

## 2011-04-05 MED ORDER — ZOLPIDEM TARTRATE 5 MG PO TABS: 10.0000 mg | ORAL_TABLET | Freq: Every evening | ORAL | Status: DC | PRN

## 2011-04-05 MED ORDER — GABAPENTIN 400 MG PO CAPS
400.0000 mg | ORAL_CAPSULE | Freq: Every day | ORAL | Status: DC
  Administered 2011-04-06: 400 mg via ORAL
  Filled 2011-04-05 (×2): qty 1

## 2011-04-05 MED ORDER — HYDRALAZINE HCL 20 MG/ML IJ SOLN
10.0000 mg | Freq: Once | INTRAMUSCULAR | Status: DC
  Filled 2011-04-05: qty 0.5

## 2011-04-05 NOTE — Plan of Care (Signed)
Problem: Phase I Progression Outcomes Goal: Voiding-avoid urinary catheter unless indicated Outcome: Not Progressing Urinary catheter placed d/t retention/bedrest Goal: Hemodynamically stable Outcome: Not Progressing Pt blood pressure very elevated (SBP 180s-190s).  NTG drip currently at 20 mcg and all blood pressure meds given.

## 2011-04-05 NOTE — H&P (Signed)
  H & P will be scanned in.  Pt was reexamined and existing H & P reviewed. No changes found.  Lorretta Harp, MD [facility] 04/05/2011 1:07 PM

## 2011-04-05 NOTE — Op Note (Signed)
Brooke Stafford is a 55 y.o. female    [id number] LOCATION:  FACILITY: [facility]  PHYSICIAN: Quay Burow, M.D. 1955/08/15   DATE OF PROCEDURE:  04/05/2011  DATE OF DISCHARGE:  [place]  PV Intervention    History obtained from chart review.Brooke Stafford is a 55 year old African female patient of [name] referred to me for peripheral vasodilation because of hypertension and claudication. She has a known history of coronary artery disease and had bypass grafting approximately 10 years ago. Jugular cardiac catheterization May 2011 and was treated medically. Her palms up of hypertension, hypokalemia, and and diabetes and claudication. She has had renal Dopplers in the office is to suggest left renal artery stenosis and lower extremity Dopplers suggest high-grade left SFA disease.she presents now for angiography and potential intervention for lifestyle limiting claudication.   PROCEDURE DESCRIPTION: the patient was brought to the second floor [facility] peripheral vascular angiographic suite in the postabsorptive state. She was premedicated by mouth Valium, IV Versed and fentanyl. The right groin was prepped and shaved in the usual sterile fashion.  1 Percent Xylocaine was used for local anesthesia. A 5 French sheath was inserted into the right femoral artery using standard Seldinger technique. Tongue extends the catheters is for abdominal aortography with bifemoral runoff using digital subtraction bolus chase step table technique Visipaque dye was used for the entirety of the case. Greater her pressure was monitored and the case      HEMODYNAMICS:    AO SYSTOLIC/AO DIASTOLIC: 123XX123     ANGIOGRAPHIC RESULTS:   1: Abdominal aortogram-normal renal arteries are normal about abdominal aortogram.  2: Left lower extremity-there was a long segmental 99% stenosis in the proximal and midportion of the left SFA with three-vessel runoff.  3: Right lower  extremity-he was a 50% segmental mid right SFA stenosis with three-vessel runoff.  Procedure description: The patient received 9000 units of heparin intravenously and an ACT of 231. She received a total of 211 cc of contrast. Contralateral access was obtained with a 5 [place] crossover catheter, 35 angled Glidewire and 6 [place] destination sheath. Visipaque dye was used for the entirety of the case.8018 100 cm length endhole catheter was used along with an 014 [place] core wire. The lesion was crossed with some difficulty I was able to get into the distal vessel without complication. The entire diseased segment was dilated with a 4 x 100 mm long balloon. Following this a 6 x 150 mm long Nitinol Cordis self-expanding stent was then deployed under angiographic guidance.it was throat postdilated with a 5 x 100 mm long balloon. There did appear to be a stenosis at the distal edge of the stent which underwent cutting balloon atherectomy with a 5 x 20 mm long angioscope balloon. Final angiographic results were reduction a long 90% stenosis to 0% residual with three-vessel runoff.the sheath was then withdrawn across the iliac bifurcation and secured. It will be removed once the ACT falls below 170 and pressure will be held on the groin to achieve hemostasis.the patient received 300 mg of by mouth Plavix during the case. She left the labin stable condition.    IMPRESSION:successful PTA and stenting of a subtotally occluded left SFA with a long night and also with bending stent with excellent angiographic result. The patient will be treated with aspirin and Plavix. She'll be hydrated overnight, discharged and come in the morning and will get followup Dopplers and ankle-brachial indices. She'll followup with Dr. Terance Ice after that.  Lorretta Harp MD, [place] 04/05/2011 2:49 PM

## 2011-04-06 DIAGNOSIS — Z951 Presence of aortocoronary bypass graft: Secondary | ICD-10-CM

## 2011-04-06 DIAGNOSIS — I739 Peripheral vascular disease, unspecified: Secondary | ICD-10-CM | POA: Diagnosis present

## 2011-04-06 DIAGNOSIS — I10 Essential (primary) hypertension: Secondary | ICD-10-CM | POA: Diagnosis present

## 2011-04-06 DIAGNOSIS — E785 Hyperlipidemia, unspecified: Secondary | ICD-10-CM | POA: Diagnosis present

## 2011-04-06 LAB — CBC
MCH: 32.2 pg (ref 26.0–34.0)
MCV: 94.5 fL (ref 78.0–100.0)
Platelets: 221 10*3/uL (ref 150–400)
RDW: 14.2 % (ref 11.5–15.5)

## 2011-04-06 LAB — BASIC METABOLIC PANEL
CO2: 22 mEq/L (ref 19–32)
Calcium: 9.6 mg/dL (ref 8.4–10.5)
Creatinine, Ser: 1.32 mg/dL — ABNORMAL HIGH (ref 0.50–1.10)
Glucose, Bld: 306 mg/dL — ABNORMAL HIGH (ref 70–99)

## 2011-04-06 LAB — GLUCOSE, CAPILLARY: Glucose-Capillary: 327 mg/dL — ABNORMAL HIGH (ref 70–99)

## 2011-04-06 MED ORDER — SODIUM CHLORIDE 0.9 % IV SOLN: INTRAVENOUS | Status: AC

## 2011-04-06 MED ORDER — MORPHINE SULFATE 4 MG/ML IJ SOLN
3.0000 mg | INTRAMUSCULAR | Status: DC | PRN
  Administered 2011-04-06: 3 mg via INTRAVENOUS

## 2011-04-06 MED ORDER — MORPHINE SULFATE 4 MG/ML IJ SOLN
INTRAMUSCULAR | Status: AC
  Administered 2011-04-06: 3 mg via INTRAVENOUS
  Filled 2011-04-06: qty 1

## 2011-04-06 MED ORDER — CALCIUM CARBONATE 1250 (500 CA) MG PO TABS
1.0000 | ORAL_TABLET | Freq: Two times a day (BID) | ORAL | Status: DC
Start: 2011-04-06 — End: 2011-04-06
  Administered 2011-04-06: 500 mg via ORAL
  Filled 2011-04-06 (×3): qty 1

## 2011-04-06 MED ORDER — CLOPIDOGREL BISULFATE 75 MG PO TABS: 75.0000 mg | ORAL_TABLET | Freq: Every day | ORAL | Status: AC

## 2011-04-06 MED ORDER — SODIUM CHLORIDE 0.9 % IV SOLN: INTRAVENOUS | Status: DC

## 2011-04-06 MED ORDER — ASPIRIN EC 81 MG PO TBEC
81.0000 mg | DELAYED_RELEASE_TABLET | Freq: Every day | ORAL | Status: DC
  Administered 2011-04-06: 81 mg via ORAL
  Filled 2011-04-06: qty 1

## 2011-04-06 MED ORDER — ACETAMINOPHEN 325 MG PO TABS: 650.0000 mg | ORAL_TABLET | ORAL | Status: AC | PRN

## 2011-04-06 NOTE — Progress Notes (Signed)
Kerin Ransom, [place] was notified at 2000 of pt blood pressure status.  SBP running 180s-190s; HR 80s. Sheath still in place, and we are trying to get the pressure down to pull the sheath out.  All po BP meds have been given at this point, as well as hydralazine 10mg .  PA gave [place] orders to start a NTG drip (5-39mcg), give 10 mg hydralazine if NTG does not work, and give another 0.25mg  xanax.  30 minutes later, pt was still having pressures in the 180s with NTG maxed out at 26mcg and the 0.25mg  xanax (pt denied hydralazine because she stated that it gave her an allergic reaction of a swollen face-PA notified).  Called PA back around 2115 about persistent high SBPs despite interventions.  PA then ordered to give clonidine 0.2mg  and wait an hour.  Gave clonidine (once available from the pharmacy) at 2215 and monitored pt BP for an hour.  PA then called back around 2330 to update that the pressures were still elevated despite interventions.  PA said he would call Dr. Gwenlyn Found and then call us back with orders.  Called back 10 minutes later and was told to start a Nicardipine drip at 36mcg and titrate up to 41mcg.  Also told us to go up to 30 mcg on the NTG.  After finding out that we do not do Nicardipine drips on 2500 and discussing this with the PA, we decided to up the pts NTG to 56mcg and give 3mg  morphine to further help with the pressure.  Also told to go ahead and pull the sheath if BP gets close to or less than 0000000 systolic.  If we still cannot get the pressure down, we were ordered to leave the sheath in until the morning.  Current pressure as of now [id number]) is 170/88.

## 2011-04-06 NOTE — Discharge Summary (Signed)
Patient ID: Brooke Stafford,  MRN: [id number], DOB/AGE: 04/22/1955 55 y.o.  Admit date: 04/05/2011 Discharge date: 04/06/2011  Primary Care Provider: Dr Leanne Lovely Primary Cardiologist: Dr Sallyanne Kuster  Discharge Diagnoses  Principal Problem:  *Claudication  Active Problems:  PVD (peripheral vascular disease), s/p Lt SFA PTA this admission   HTN (hypertension), malignant, patent renal arteries  Hx of CABG, cath 2011  Diabetes mellitus, IDDM  Dyslipidemia    Procedures: PV angiogram, Lt SFA PTA  History of Present Illness: History obtained from chart review.Brooke Stafford is a 55 year old African female patient of [name] referred to me for peripheral vasodilation because of hypertension and claudication. She has a known history of coronary artery disease and had bypass grafting approximately 10 years ago. Jugular cardiac catheterization May 2011 and was treated medically. Her palms up of hypertension, hypokalemia, and and diabetes and claudication. She has had renal Dopplers in the office is to suggest left renal artery stenosis and lower extremity Dopplers suggest high-grade left SFA disease.she presents now for angiography and potential intervention for lifestyle limiting claudication.   Hospital Course: Brooke Stafford is a 55 year old female followed by Dr.Croitoru. She is a 55 year old female She has coronary disease and vascular disease. She had bypass grafting 10 years ago. Cardiac catheterization in May of 2011 revealed moderate disease to be treated medically. She had a patent LIMA to the LAD patent SVG to the RCA patent SVG to the first diagonal. Ejection fraction was 50%. She's had intermittent claudication. Outpatient Dopplers were abnormal. She has multiple other medical problems including insulin-dependent diabetes and difficult to control hypertension and dyslipidemia. She was referred to Dr. Gwenlyn Found. She was admitted for elective PV angiogram. This was done 04/05/2011. She had a subtotal left SFA. This  was intervened on by Dr. Gwenlyn Found and stented. She tolerated procedure well although she was markedly hypertensive of postop. She required several doses of hydralazine and extra clonidine and IV nitroglycerin. Ultimately her sheath was able to be pulled. This morning she has ambulated without problems we feel she can be discharged. She'll follow up with Dr. Gwenlyn Found as an outpatient once and then follow up with Dr. Sallyanne Kuster.  Discharge Vitals:  Blood pressure 158/74, pulse 67, temperature 99.5 F (37.5 C), temperature source Oral, resp. rate 15, height 5\' 10"  (1.778 m), weight 109 kg (240 lb 4.8 oz), SpO2 98.00%.    Labs: Results for orders placed during the hospital encounter of 04/05/11 (from the past 48 hour(s))  GLUCOSE, CAPILLARY     Status: Abnormal   Collection Time   04/05/11  6:25 AM      Component Value Range Comment   Glucose-Capillary 370 (*) 70 - 99 (mg/dL)   CREATININE, SERUM     Status: Abnormal   Collection Time   04/05/11 11:38 AM      Component Value Range Comment   Creatinine, Ser 1.40 (*) 0.50 - 1.10 (mg/dL)    GFR calc non Af Amer 41 (*) >90 (mL/min)    GFR calc Af Amer 48 (*) >90 (mL/min)   POCT ACTIVATED CLOTTING TIME     Status: Normal   Collection Time   04/05/11  1:53 PM      Component Value Range Comment   Activated Clotting Time 204     POCT ACTIVATED CLOTTING TIME     Status: Normal   Collection Time   04/05/11  2:15 PM      Component Value Range Comment   Activated Clotting Time 231     GLUCOSE, CAPILLARY  Status: Abnormal   Collection Time   04/05/11  2:50 PM      Component Value Range Comment   Glucose-Capillary 272 (*) 70 - 99 (mg/dL)   POCT ACTIVATED CLOTTING TIME     Status: Normal   Collection Time   04/05/11  3:19 PM      Component Value Range Comment   Activated Clotting Time 193     GLUCOSE, CAPILLARY     Status: Abnormal   Collection Time   04/05/11  3:48 PM      Component Value Range Comment   Glucose-Capillary 315 (*) 70 - 99 (mg/dL)     Comment 1 Notify RN     GLUCOSE, CAPILLARY     Status: Abnormal   Collection Time   04/05/11  9:50 PM      Component Value Range Comment   Glucose-Capillary 293 (*) 70 - 99 (mg/dL)    Comment 1 Notify RN      Comment 2 Documented in Chart     BASIC METABOLIC PANEL     Status: Abnormal   Collection Time   04/06/11  3:30 AM      Component Value Range Comment   Sodium 130 (*) 135 - 145 (mEq/L)    Potassium 4.7  3.5 - 5.1 (mEq/L)    Chloride 97  96 - 112 (mEq/L)    CO2 22  19 - 32 (mEq/L)    Glucose, Bld 306 (*) 70 - 99 (mg/dL)    BUN 18  6 - 23 (mg/dL)    Creatinine, Ser 1.32 (*) 0.50 - 1.10 (mg/dL)    Calcium 9.6  8.4 - 10.5 (mg/dL)    GFR calc non Af Amer 44 (*) >90 (mL/min)    GFR calc Af Amer 51 (*) >90 (mL/min)   CBC     Status: Abnormal   Collection Time   04/06/11  3:30 AM      Component Value Range Comment   WBC 7.8  4.0 - 10.5 (K/uL)    RBC 3.82 (*) 3.87 - 5.11 (MIL/uL)    Hemoglobin 12.3  12.0 - 15.0 (g/dL)    HCT 36.1  36.0 - 46.0 (%)    MCV 94.5  78.0 - 100.0 (fL)    MCH 32.2  26.0 - 34.0 (pg)    MCHC 34.1  30.0 - 36.0 (g/dL)    RDW 14.2  11.5 - 15.5 (%)    Platelets 221  150 - 400 (K/uL)   GLUCOSE, CAPILLARY     Status: Abnormal   Collection Time   04/06/11  7:26 AM      Component Value Range Comment   Glucose-Capillary 327 (*) 70 - 99 (mg/dL)     Disposition:    Discharge Medications:  Current Discharge Medication List    CONTINUE these medications which have NOT CHANGED   Details  albuterol (PROVENTIL HFA;VENTOLIN HFA) 108 (90 BASE) MCG/ACT inhaler Inhale 2 puffs into the lungs every 6 (six) hours as needed. For shortness of breath      amLODipine (NORVASC) 10 MG tablet Take 10 mg by mouth daily.      aspirin 81 MG tablet Take 81 mg by mouth daily.      benazepril (LOTENSIN) 20 MG tablet Take 20 mg by mouth daily.      calcium carbonate 1250 MG capsule Take 1,250 mg by mouth 2 (two) times daily with a meal.      citalopram (CELEXA) 20 MG  tablet Take 20 mg  by mouth daily.      cloNIDine (CATAPRES) 0.2 MG tablet Take 0.2 mg by mouth 3 (three) times daily.      fluticasone (VERAMYST) 27.5 MCG/SPRAY nasal spray Place 2 sprays into the nose daily.      gabapentin (NEURONTIN) 400 MG capsule Take 400 mg by mouth daily.      glimepiride (AMARYL) 4 MG tablet Take 4 mg by mouth 2 (two) times daily.      hydrochlorothiazide (HYDRODIURIL) 25 MG tablet Take 25 mg by mouth daily.      insulin aspart (NOVOLOG) 100 UNIT/ML injection Inject 10-25 Units into the skin 3 (three) times daily before meals. Per sliding scale     Insulin Detemir (LEVEMIR FLEXPEN [place]) Inject 30 Units into the skin 2 (two) times daily.      insulin glargine (LANTUS) 100 UNIT/ML injection Inject 60 Units into the skin at bedtime.      loratadine (CLARITIN) 10 MG tablet Take 10 mg by mouth daily.      metoprolol (TOPROL-XL) 200 MG 24 hr tablet Take 200 mg by mouth daily.      nitroGLYCERIN (NITROSTAT) 0.4 MG SL tablet Place 0.4 mg under the tongue every 5 (five) minutes as needed. For chest      ranitidine (ZANTAC) 150 MG tablet Take 150 mg by mouth daily.      simvastatin (ZOCOR) 40 MG tablet Take 40 mg by mouth at bedtime.      spironolactone (ALDACTONE) 25 MG tablet Take 25 mg by mouth 2 (two) times daily.      traMADol (ULTRAM) 50 MG tablet Take 50 mg by mouth every 8 (eight) hours as needed. For pain/Maximum dose= 8 tablets per day     traZODone (DESYREL) 50 MG tablet Take 50 mg by mouth at bedtime.      Vitamin D, Ergocalciferol, (DRISDOL) 50000 UNITS CAPS Take 50,000 Units by mouth 2 (two) times a week.          Outstanding Labs/Studies  Duration of Discharge Encounter: Greater than 30 minutes including physician time.  Angelena Form PA-C 04/06/2011 10:08 AM

## 2011-04-06 NOTE — Progress Notes (Signed)
Site area: right groin  Site Prior to Removal:  Level 0  Pressure Applied For 25 MINUTES    Minutes Beginning at 0155   Manual:   yes  Patient Status During Pull:  stable  Post Pull Groin Site:  Level 0  Post Pull Instructions Given:  yes  Post Pull Pulses Present:  yes  Dressing Applied:  yes  Comments:  VSS (BP elevated prior to pull; got pressure down to SBP of 155 before pull).

## 2011-04-06 NOTE — Progress Notes (Signed)
Subjective:  No CP/SOB today S/P LSFA PTA & Stent  Objective:  Temp:  [98 F (36.7 C)-99.5 F (37.5 C)] 99.5 F (37.5 C) (12/22 0727) Pulse Rate:  [57-104] 67  (12/22 0727) Resp:  [12-23] 15  (12/22 0800) BP: (120-213)/(65-101) 158/74 mmHg (12/22 0800) SpO2:  [97 %-100 %] 98 % (12/22 0727) Weight:  [109 kg (240 lb 4.8 oz)] 240 lb 4.8 oz (109 kg) (12/22 0018) Weight change: 0.137 kg (4.8 oz)  Intake/Output from previous day: 12/21 0701 - 12/22 0700 In: 939.8 [P.O.:120; I.V.:819.8] Out: 2300 [Urine:1950; Emesis/NG output:350]  Intake/Output from this shift: Total I/O In: 9 [I.V.:9] Out: 75 [Urine:75]  Physical Exam: General appearance: alert and cooperative Neck: no adenopathy, no carotid bruit, no JVD, supple, symmetrical, trachea midline and thyroid not enlarged, symmetric, no tenderness/mass/nodules Lungs: clear to auscultation bilaterally Heart: regular rate and rhythm, S1, S2 normal, no murmur, click, rub or gallop Extremities: extremities normal, atraumatic, no cyanosis or edema Pulses: 2+ and symmetric 2+ L PP  Lab Results: Results for orders placed during the hospital encounter of 04/05/11 (from the past 48 hour(s))  GLUCOSE, CAPILLARY     Status: Abnormal   Collection Time   04/05/11  6:25 AM      Component Value Range Comment   Glucose-Capillary 370 (*) 70 - 99 (mg/dL)   CREATININE, SERUM     Status: Abnormal   Collection Time   04/05/11 11:38 AM      Component Value Range Comment   Creatinine, Ser 1.40 (*) 0.50 - 1.10 (mg/dL)    GFR calc non Af Amer 41 (*) >90 (mL/min)    GFR calc Af Amer 48 (*) >90 (mL/min)   POCT ACTIVATED CLOTTING TIME     Status: Normal   Collection Time   04/05/11  1:53 PM      Component Value Range Comment   Activated Clotting Time 204     POCT ACTIVATED CLOTTING TIME     Status: Normal   Collection Time   04/05/11  2:15 PM      Component Value Range Comment   Activated Clotting Time 231     GLUCOSE, CAPILLARY     Status:  Abnormal   Collection Time   04/05/11  2:50 PM      Component Value Range Comment   Glucose-Capillary 272 (*) 70 - 99 (mg/dL)   POCT ACTIVATED CLOTTING TIME     Status: Normal   Collection Time   04/05/11  3:19 PM      Component Value Range Comment   Activated Clotting Time 193     GLUCOSE, CAPILLARY     Status: Abnormal   Collection Time   04/05/11  3:48 PM      Component Value Range Comment   Glucose-Capillary 315 (*) 70 - 99 (mg/dL)    Comment 1 Notify RN     GLUCOSE, CAPILLARY     Status: Abnormal   Collection Time   04/05/11  9:50 PM      Component Value Range Comment   Glucose-Capillary 293 (*) 70 - 99 (mg/dL)    Comment 1 Notify RN      Comment 2 Documented in Chart     BASIC METABOLIC PANEL     Status: Abnormal   Collection Time   04/06/11  3:30 AM      Component Value Range Comment   Sodium 130 (*) 135 - 145 (mEq/L)    Potassium 4.7  3.5 - 5.1 (mEq/L)  Chloride 97  96 - 112 (mEq/L)    CO2 22  19 - 32 (mEq/L)    Glucose, Bld 306 (*) 70 - 99 (mg/dL)    BUN 18  6 - 23 (mg/dL)    Creatinine, Ser 1.32 (*) 0.50 - 1.10 (mg/dL)    Calcium 9.6  8.4 - 10.5 (mg/dL)    GFR calc non Af Amer 44 (*) >90 (mL/min)    GFR calc Af Amer 51 (*) >90 (mL/min)   CBC     Status: Abnormal   Collection Time   04/06/11  3:30 AM      Component Value Range Comment   WBC 7.8  4.0 - 10.5 (K/uL)    RBC 3.82 (*) 3.87 - 5.11 (MIL/uL)    Hemoglobin 12.3  12.0 - 15.0 (g/dL)    HCT 36.1  36.0 - 46.0 (%)    MCV 94.5  78.0 - 100.0 (fL)    MCH 32.2  26.0 - 34.0 (pg)    MCHC 34.1  30.0 - 36.0 (g/dL)    RDW 14.2  11.5 - 15.5 (%)    Platelets 221  150 - 400 (K/uL)   GLUCOSE, CAPILLARY     Status: Abnormal   Collection Time   04/06/11  7:26 AM      Component Value Range Comment   Glucose-Capillary 327 (*) 70 - 99 (mg/dL)     Imaging: Imaging results have been reviewed  Assessment/Plan:   1. Active Problems: 2.  * No active hospital problems. *  3.   Time Spent Directly with  Patient:  20 minutes  Length of Stay:  LOS: 1 day   Looks great this am. Difficulty with HTN last PM during sheath pull. OK for D/C home this am on ASA and Plavix. Needs LEA next 1-2 weeks then ROV with RAW.   Lorretta Harp 04/06/2011, 8:19 AM

## 2011-04-10 LAB — POCT ACTIVATED CLOTTING TIME: Activated Clotting Time: 149 seconds

## 2011-04-25 DIAGNOSIS — F411 Generalized anxiety disorder: Secondary | ICD-10-CM | POA: Diagnosis not present

## 2011-04-25 DIAGNOSIS — I10 Essential (primary) hypertension: Secondary | ICD-10-CM | POA: Diagnosis not present

## 2011-04-25 DIAGNOSIS — E119 Type 2 diabetes mellitus without complications: Secondary | ICD-10-CM | POA: Diagnosis not present

## 2011-04-25 DIAGNOSIS — N19 Unspecified kidney failure: Secondary | ICD-10-CM | POA: Diagnosis not present

## 2011-04-25 DIAGNOSIS — M545 Low back pain: Secondary | ICD-10-CM | POA: Diagnosis not present

## 2011-04-25 DIAGNOSIS — I2510 Atherosclerotic heart disease of native coronary artery without angina pectoris: Secondary | ICD-10-CM | POA: Diagnosis not present

## 2011-04-25 DIAGNOSIS — M25559 Pain in unspecified hip: Secondary | ICD-10-CM | POA: Diagnosis not present

## 2011-05-24 DIAGNOSIS — I10 Essential (primary) hypertension: Secondary | ICD-10-CM | POA: Diagnosis not present

## 2011-05-24 DIAGNOSIS — I2510 Atherosclerotic heart disease of native coronary artery without angina pectoris: Secondary | ICD-10-CM | POA: Diagnosis not present

## 2011-05-24 DIAGNOSIS — M25559 Pain in unspecified hip: Secondary | ICD-10-CM | POA: Diagnosis not present

## 2011-05-24 DIAGNOSIS — E785 Hyperlipidemia, unspecified: Secondary | ICD-10-CM | POA: Diagnosis not present

## 2011-05-24 DIAGNOSIS — F411 Generalized anxiety disorder: Secondary | ICD-10-CM | POA: Diagnosis not present

## 2011-05-24 DIAGNOSIS — E119 Type 2 diabetes mellitus without complications: Secondary | ICD-10-CM | POA: Diagnosis not present

## 2011-05-24 DIAGNOSIS — M545 Low back pain: Secondary | ICD-10-CM | POA: Diagnosis not present

## 2011-05-24 DIAGNOSIS — M79609 Pain in unspecified limb: Secondary | ICD-10-CM | POA: Diagnosis not present

## 2011-05-24 DIAGNOSIS — I70219 Atherosclerosis of native arteries of extremities with intermittent claudication, unspecified extremity: Secondary | ICD-10-CM | POA: Diagnosis not present

## 2011-05-24 DIAGNOSIS — N19 Unspecified kidney failure: Secondary | ICD-10-CM | POA: Diagnosis not present

## 2011-06-05 DIAGNOSIS — I739 Peripheral vascular disease, unspecified: Secondary | ICD-10-CM | POA: Diagnosis not present

## 2011-06-18 DIAGNOSIS — F411 Generalized anxiety disorder: Secondary | ICD-10-CM | POA: Diagnosis not present

## 2011-06-18 DIAGNOSIS — E119 Type 2 diabetes mellitus without complications: Secondary | ICD-10-CM | POA: Diagnosis not present

## 2011-06-18 DIAGNOSIS — I2510 Atherosclerotic heart disease of native coronary artery without angina pectoris: Secondary | ICD-10-CM | POA: Diagnosis not present

## 2011-06-18 DIAGNOSIS — M25559 Pain in unspecified hip: Secondary | ICD-10-CM | POA: Diagnosis not present

## 2011-06-18 DIAGNOSIS — M545 Low back pain: Secondary | ICD-10-CM | POA: Diagnosis not present

## 2011-06-18 DIAGNOSIS — E785 Hyperlipidemia, unspecified: Secondary | ICD-10-CM | POA: Diagnosis not present

## 2011-06-18 DIAGNOSIS — I10 Essential (primary) hypertension: Secondary | ICD-10-CM | POA: Diagnosis not present

## 2011-06-18 DIAGNOSIS — N19 Unspecified kidney failure: Secondary | ICD-10-CM | POA: Diagnosis not present

## 2011-07-02 DIAGNOSIS — M545 Low back pain: Secondary | ICD-10-CM | POA: Diagnosis not present

## 2011-07-02 DIAGNOSIS — E119 Type 2 diabetes mellitus without complications: Secondary | ICD-10-CM | POA: Diagnosis not present

## 2011-07-02 DIAGNOSIS — N19 Unspecified kidney failure: Secondary | ICD-10-CM | POA: Diagnosis not present

## 2011-07-02 DIAGNOSIS — I2510 Atherosclerotic heart disease of native coronary artery without angina pectoris: Secondary | ICD-10-CM | POA: Diagnosis not present

## 2011-07-02 DIAGNOSIS — E785 Hyperlipidemia, unspecified: Secondary | ICD-10-CM | POA: Diagnosis not present

## 2011-07-02 DIAGNOSIS — F411 Generalized anxiety disorder: Secondary | ICD-10-CM | POA: Diagnosis not present

## 2011-07-02 DIAGNOSIS — M25559 Pain in unspecified hip: Secondary | ICD-10-CM | POA: Diagnosis not present

## 2011-07-02 DIAGNOSIS — I10 Essential (primary) hypertension: Secondary | ICD-10-CM | POA: Diagnosis not present

## 2011-07-05 DIAGNOSIS — I2510 Atherosclerotic heart disease of native coronary artery without angina pectoris: Secondary | ICD-10-CM | POA: Diagnosis not present

## 2011-07-05 DIAGNOSIS — E782 Mixed hyperlipidemia: Secondary | ICD-10-CM | POA: Diagnosis not present

## 2011-07-05 DIAGNOSIS — E119 Type 2 diabetes mellitus without complications: Secondary | ICD-10-CM | POA: Diagnosis not present

## 2011-07-30 DIAGNOSIS — M25559 Pain in unspecified hip: Secondary | ICD-10-CM | POA: Diagnosis not present

## 2011-07-30 DIAGNOSIS — E119 Type 2 diabetes mellitus without complications: Secondary | ICD-10-CM | POA: Diagnosis not present

## 2011-07-30 DIAGNOSIS — I10 Essential (primary) hypertension: Secondary | ICD-10-CM | POA: Diagnosis not present

## 2011-07-30 DIAGNOSIS — N19 Unspecified kidney failure: Secondary | ICD-10-CM | POA: Diagnosis not present

## 2011-07-30 DIAGNOSIS — E785 Hyperlipidemia, unspecified: Secondary | ICD-10-CM | POA: Diagnosis not present

## 2011-07-30 DIAGNOSIS — F411 Generalized anxiety disorder: Secondary | ICD-10-CM | POA: Diagnosis not present

## 2011-07-30 DIAGNOSIS — I2510 Atherosclerotic heart disease of native coronary artery without angina pectoris: Secondary | ICD-10-CM | POA: Diagnosis not present

## 2011-07-30 DIAGNOSIS — M545 Low back pain: Secondary | ICD-10-CM | POA: Diagnosis not present

## 2011-08-01 DIAGNOSIS — E119 Type 2 diabetes mellitus without complications: Secondary | ICD-10-CM | POA: Diagnosis not present

## 2011-08-01 DIAGNOSIS — R109 Unspecified abdominal pain: Secondary | ICD-10-CM | POA: Diagnosis not present

## 2011-08-15 DIAGNOSIS — F411 Generalized anxiety disorder: Secondary | ICD-10-CM | POA: Diagnosis not present

## 2011-08-15 DIAGNOSIS — R109 Unspecified abdominal pain: Secondary | ICD-10-CM | POA: Diagnosis not present

## 2011-08-15 DIAGNOSIS — I10 Essential (primary) hypertension: Secondary | ICD-10-CM | POA: Diagnosis not present

## 2011-08-15 DIAGNOSIS — I2510 Atherosclerotic heart disease of native coronary artery without angina pectoris: Secondary | ICD-10-CM | POA: Diagnosis not present

## 2011-08-15 DIAGNOSIS — M545 Low back pain: Secondary | ICD-10-CM | POA: Diagnosis not present

## 2011-08-15 DIAGNOSIS — E785 Hyperlipidemia, unspecified: Secondary | ICD-10-CM | POA: Diagnosis not present

## 2011-08-15 DIAGNOSIS — E119 Type 2 diabetes mellitus without complications: Secondary | ICD-10-CM | POA: Diagnosis not present

## 2011-08-15 DIAGNOSIS — N19 Unspecified kidney failure: Secondary | ICD-10-CM | POA: Diagnosis not present

## 2011-08-27 DIAGNOSIS — E7412 Hereditary fructose intolerance: Secondary | ICD-10-CM | POA: Diagnosis not present

## 2011-09-10 DIAGNOSIS — I10 Essential (primary) hypertension: Secondary | ICD-10-CM | POA: Diagnosis not present

## 2011-09-10 DIAGNOSIS — R109 Unspecified abdominal pain: Secondary | ICD-10-CM | POA: Diagnosis not present

## 2011-09-10 DIAGNOSIS — M545 Low back pain: Secondary | ICD-10-CM | POA: Diagnosis not present

## 2011-09-10 DIAGNOSIS — E119 Type 2 diabetes mellitus without complications: Secondary | ICD-10-CM | POA: Diagnosis not present

## 2011-09-10 DIAGNOSIS — N19 Unspecified kidney failure: Secondary | ICD-10-CM | POA: Diagnosis not present

## 2011-09-10 DIAGNOSIS — E785 Hyperlipidemia, unspecified: Secondary | ICD-10-CM | POA: Diagnosis not present

## 2011-09-10 DIAGNOSIS — F411 Generalized anxiety disorder: Secondary | ICD-10-CM | POA: Diagnosis not present

## 2011-09-10 DIAGNOSIS — I2510 Atherosclerotic heart disease of native coronary artery without angina pectoris: Secondary | ICD-10-CM | POA: Diagnosis not present

## 2011-10-22 DIAGNOSIS — J45909 Unspecified asthma, uncomplicated: Secondary | ICD-10-CM | POA: Diagnosis not present

## 2011-10-22 DIAGNOSIS — I2510 Atherosclerotic heart disease of native coronary artery without angina pectoris: Secondary | ICD-10-CM | POA: Diagnosis not present

## 2011-10-22 DIAGNOSIS — E785 Hyperlipidemia, unspecified: Secondary | ICD-10-CM | POA: Diagnosis not present

## 2011-10-22 DIAGNOSIS — M545 Low back pain: Secondary | ICD-10-CM | POA: Diagnosis not present

## 2011-10-22 DIAGNOSIS — E119 Type 2 diabetes mellitus without complications: Secondary | ICD-10-CM | POA: Diagnosis not present

## 2011-10-22 DIAGNOSIS — F411 Generalized anxiety disorder: Secondary | ICD-10-CM | POA: Diagnosis not present

## 2011-10-22 DIAGNOSIS — I10 Essential (primary) hypertension: Secondary | ICD-10-CM | POA: Diagnosis not present

## 2011-10-22 DIAGNOSIS — N19 Unspecified kidney failure: Secondary | ICD-10-CM | POA: Diagnosis not present

## 2011-11-21 DIAGNOSIS — N19 Unspecified kidney failure: Secondary | ICD-10-CM | POA: Diagnosis not present

## 2011-11-21 DIAGNOSIS — E785 Hyperlipidemia, unspecified: Secondary | ICD-10-CM | POA: Diagnosis not present

## 2011-11-21 DIAGNOSIS — I2510 Atherosclerotic heart disease of native coronary artery without angina pectoris: Secondary | ICD-10-CM | POA: Diagnosis not present

## 2011-11-21 DIAGNOSIS — J45909 Unspecified asthma, uncomplicated: Secondary | ICD-10-CM | POA: Diagnosis not present

## 2011-11-21 DIAGNOSIS — M545 Low back pain: Secondary | ICD-10-CM | POA: Diagnosis not present

## 2011-11-21 DIAGNOSIS — I10 Essential (primary) hypertension: Secondary | ICD-10-CM | POA: Diagnosis not present

## 2011-11-21 DIAGNOSIS — E119 Type 2 diabetes mellitus without complications: Secondary | ICD-10-CM | POA: Diagnosis not present

## 2011-11-21 DIAGNOSIS — F411 Generalized anxiety disorder: Secondary | ICD-10-CM | POA: Diagnosis not present

## 2011-12-26 ENCOUNTER — Ambulatory Visit: Payer: Medicare Other | Admitting: Dietician

## 2012-01-23 DIAGNOSIS — E119 Type 2 diabetes mellitus without complications: Secondary | ICD-10-CM | POA: Diagnosis not present

## 2012-01-23 DIAGNOSIS — E785 Hyperlipidemia, unspecified: Secondary | ICD-10-CM | POA: Diagnosis not present

## 2012-01-23 DIAGNOSIS — I2510 Atherosclerotic heart disease of native coronary artery without angina pectoris: Secondary | ICD-10-CM | POA: Diagnosis not present

## 2012-01-23 DIAGNOSIS — M545 Low back pain: Secondary | ICD-10-CM | POA: Diagnosis not present

## 2012-01-23 DIAGNOSIS — I10 Essential (primary) hypertension: Secondary | ICD-10-CM | POA: Diagnosis not present

## 2012-01-23 DIAGNOSIS — N19 Unspecified kidney failure: Secondary | ICD-10-CM | POA: Diagnosis not present

## 2012-01-23 DIAGNOSIS — J45909 Unspecified asthma, uncomplicated: Secondary | ICD-10-CM | POA: Diagnosis not present

## 2012-01-23 DIAGNOSIS — F411 Generalized anxiety disorder: Secondary | ICD-10-CM | POA: Diagnosis not present

## 2012-01-27 ENCOUNTER — Ambulatory Visit: Payer: Medicare Other | Admitting: *Deleted

## 2012-01-30 DIAGNOSIS — I10 Essential (primary) hypertension: Secondary | ICD-10-CM | POA: Diagnosis not present

## 2012-01-30 DIAGNOSIS — N289 Disorder of kidney and ureter, unspecified: Secondary | ICD-10-CM | POA: Diagnosis not present

## 2012-01-30 DIAGNOSIS — I2510 Atherosclerotic heart disease of native coronary artery without angina pectoris: Secondary | ICD-10-CM | POA: Diagnosis not present

## 2012-02-15 DIAGNOSIS — Z23 Encounter for immunization: Secondary | ICD-10-CM | POA: Diagnosis not present

## 2012-02-20 ENCOUNTER — Ambulatory Visit: Payer: Medicare Other | Admitting: *Deleted

## 2012-03-18 DIAGNOSIS — E119 Type 2 diabetes mellitus without complications: Secondary | ICD-10-CM | POA: Diagnosis not present

## 2012-03-18 DIAGNOSIS — E785 Hyperlipidemia, unspecified: Secondary | ICD-10-CM | POA: Diagnosis not present

## 2012-03-18 DIAGNOSIS — I10 Essential (primary) hypertension: Secondary | ICD-10-CM | POA: Diagnosis not present

## 2012-03-18 DIAGNOSIS — I2510 Atherosclerotic heart disease of native coronary artery without angina pectoris: Secondary | ICD-10-CM | POA: Diagnosis not present

## 2012-03-18 DIAGNOSIS — M545 Low back pain: Secondary | ICD-10-CM | POA: Diagnosis not present

## 2012-03-18 DIAGNOSIS — N19 Unspecified kidney failure: Secondary | ICD-10-CM | POA: Diagnosis not present

## 2012-03-18 DIAGNOSIS — F411 Generalized anxiety disorder: Secondary | ICD-10-CM | POA: Diagnosis not present

## 2012-03-18 DIAGNOSIS — J45909 Unspecified asthma, uncomplicated: Secondary | ICD-10-CM | POA: Diagnosis not present

## 2012-03-24 DIAGNOSIS — E1159 Type 2 diabetes mellitus with other circulatory complications: Secondary | ICD-10-CM | POA: Diagnosis not present

## 2012-03-24 DIAGNOSIS — I2510 Atherosclerotic heart disease of native coronary artery without angina pectoris: Secondary | ICD-10-CM | POA: Diagnosis not present

## 2012-03-24 DIAGNOSIS — J329 Chronic sinusitis, unspecified: Secondary | ICD-10-CM | POA: Diagnosis not present

## 2012-03-24 DIAGNOSIS — E785 Hyperlipidemia, unspecified: Secondary | ICD-10-CM | POA: Diagnosis not present

## 2012-03-24 DIAGNOSIS — N19 Unspecified kidney failure: Secondary | ICD-10-CM | POA: Diagnosis not present

## 2012-03-24 DIAGNOSIS — I10 Essential (primary) hypertension: Secondary | ICD-10-CM | POA: Diagnosis not present

## 2012-03-26 ENCOUNTER — Ambulatory Visit: Payer: Medicare Other | Admitting: *Deleted

## 2012-04-22 DIAGNOSIS — E1129 Type 2 diabetes mellitus with other diabetic kidney complication: Secondary | ICD-10-CM | POA: Diagnosis not present

## 2012-04-22 DIAGNOSIS — I10 Essential (primary) hypertension: Secondary | ICD-10-CM | POA: Diagnosis not present

## 2012-04-22 DIAGNOSIS — N2581 Secondary hyperparathyroidism of renal origin: Secondary | ICD-10-CM | POA: Diagnosis not present

## 2012-04-22 DIAGNOSIS — D509 Iron deficiency anemia, unspecified: Secondary | ICD-10-CM | POA: Diagnosis not present

## 2012-05-18 ENCOUNTER — Other Ambulatory Visit: Payer: Self-pay | Admitting: Nephrology

## 2012-05-20 ENCOUNTER — Ambulatory Visit
Admission: RE | Admit: 2012-05-20 | Discharge: 2012-05-20 | Disposition: A | Discharge status: Home / Self Care | Payer: Medicare Other | Source: Ambulatory Visit | Attending: Nephrology | Admitting: Nephrology

## 2012-05-20 DIAGNOSIS — N2889 Other specified disorders of kidney and ureter: Secondary | ICD-10-CM | POA: Diagnosis not present

## 2012-06-04 DIAGNOSIS — J45909 Unspecified asthma, uncomplicated: Secondary | ICD-10-CM | POA: Diagnosis not present

## 2012-06-04 DIAGNOSIS — I10 Essential (primary) hypertension: Secondary | ICD-10-CM | POA: Diagnosis not present

## 2012-06-04 DIAGNOSIS — N19 Unspecified kidney failure: Secondary | ICD-10-CM | POA: Diagnosis not present

## 2012-06-04 DIAGNOSIS — M545 Low back pain: Secondary | ICD-10-CM | POA: Diagnosis not present

## 2012-06-04 DIAGNOSIS — J329 Chronic sinusitis, unspecified: Secondary | ICD-10-CM | POA: Diagnosis not present

## 2012-06-04 DIAGNOSIS — E119 Type 2 diabetes mellitus without complications: Secondary | ICD-10-CM | POA: Diagnosis not present

## 2012-06-04 DIAGNOSIS — E785 Hyperlipidemia, unspecified: Secondary | ICD-10-CM | POA: Diagnosis not present

## 2012-06-04 DIAGNOSIS — I2510 Atherosclerotic heart disease of native coronary artery without angina pectoris: Secondary | ICD-10-CM | POA: Diagnosis not present

## 2012-06-09 DIAGNOSIS — I10 Essential (primary) hypertension: Secondary | ICD-10-CM | POA: Diagnosis not present

## 2012-06-09 DIAGNOSIS — N2581 Secondary hyperparathyroidism of renal origin: Secondary | ICD-10-CM | POA: Diagnosis not present

## 2012-06-09 DIAGNOSIS — D509 Iron deficiency anemia, unspecified: Secondary | ICD-10-CM | POA: Diagnosis not present

## 2012-06-09 DIAGNOSIS — E1129 Type 2 diabetes mellitus with other diabetic kidney complication: Secondary | ICD-10-CM | POA: Diagnosis not present

## 2012-07-13 ENCOUNTER — Encounter (HOSPITAL_COMMUNITY): Payer: Self-pay | Admitting: Emergency Medicine

## 2012-07-13 ENCOUNTER — Emergency Department (HOSPITAL_COMMUNITY)
Admission: EM | Admit: 2012-07-13 | Discharge: 2012-07-14 | Disposition: A | Discharge status: Home / Self Care | Payer: Medicare Other | Attending: Emergency Medicine | Admitting: Emergency Medicine

## 2012-07-13 DIAGNOSIS — Z79899 Other long term (current) drug therapy: Secondary | ICD-10-CM | POA: Diagnosis not present

## 2012-07-13 DIAGNOSIS — I739 Peripheral vascular disease, unspecified: Secondary | ICD-10-CM | POA: Insufficient documentation

## 2012-07-13 DIAGNOSIS — IMO0002 Reserved for concepts with insufficient information to code with codable children: Secondary | ICD-10-CM | POA: Insufficient documentation

## 2012-07-13 DIAGNOSIS — I252 Old myocardial infarction: Secondary | ICD-10-CM | POA: Diagnosis not present

## 2012-07-13 DIAGNOSIS — J45909 Unspecified asthma, uncomplicated: Secondary | ICD-10-CM | POA: Insufficient documentation

## 2012-07-13 DIAGNOSIS — I10 Essential (primary) hypertension: Secondary | ICD-10-CM | POA: Diagnosis not present

## 2012-07-13 DIAGNOSIS — E119 Type 2 diabetes mellitus without complications: Secondary | ICD-10-CM | POA: Diagnosis not present

## 2012-07-13 DIAGNOSIS — R1032 Left lower quadrant pain: Secondary | ICD-10-CM | POA: Diagnosis not present

## 2012-07-13 DIAGNOSIS — Z794 Long term (current) use of insulin: Secondary | ICD-10-CM | POA: Insufficient documentation

## 2012-07-13 DIAGNOSIS — Z87891 Personal history of nicotine dependence: Secondary | ICD-10-CM | POA: Insufficient documentation

## 2012-07-13 DIAGNOSIS — K219 Gastro-esophageal reflux disease without esophagitis: Secondary | ICD-10-CM | POA: Insufficient documentation

## 2012-07-13 DIAGNOSIS — R109 Unspecified abdominal pain: Secondary | ICD-10-CM

## 2012-07-13 DIAGNOSIS — Z951 Presence of aortocoronary bypass graft: Secondary | ICD-10-CM | POA: Insufficient documentation

## 2012-07-13 DIAGNOSIS — Z7982 Long term (current) use of aspirin: Secondary | ICD-10-CM | POA: Insufficient documentation

## 2012-07-13 DIAGNOSIS — F411 Generalized anxiety disorder: Secondary | ICD-10-CM | POA: Diagnosis not present

## 2012-07-13 DIAGNOSIS — I2510 Atherosclerotic heart disease of native coronary artery without angina pectoris: Secondary | ICD-10-CM | POA: Insufficient documentation

## 2012-07-13 DIAGNOSIS — I509 Heart failure, unspecified: Secondary | ICD-10-CM | POA: Insufficient documentation

## 2012-07-13 DIAGNOSIS — Z8701 Personal history of pneumonia (recurrent): Secondary | ICD-10-CM | POA: Diagnosis not present

## 2012-07-13 DIAGNOSIS — Z8679 Personal history of other diseases of the circulatory system: Secondary | ICD-10-CM | POA: Diagnosis not present

## 2012-07-13 DIAGNOSIS — Z7902 Long term (current) use of antithrombotics/antiplatelets: Secondary | ICD-10-CM | POA: Insufficient documentation

## 2012-07-13 DIAGNOSIS — K7689 Other specified diseases of liver: Secondary | ICD-10-CM | POA: Diagnosis not present

## 2012-07-13 DIAGNOSIS — Z862 Personal history of diseases of the blood and blood-forming organs and certain disorders involving the immune mechanism: Secondary | ICD-10-CM | POA: Diagnosis not present

## 2012-07-13 LAB — COMPREHENSIVE METABOLIC PANEL
AST: 23 U/L (ref 0–37)
Albumin: 4.2 g/dL (ref 3.5–5.2)
Alkaline Phosphatase: 92 U/L (ref 39–117)
BUN: 17 mg/dL (ref 6–23)
CO2: 26 mEq/L (ref 19–32)
Chloride: 100 mEq/L (ref 96–112)
Potassium: 3.9 mEq/L (ref 3.5–5.1)
Total Bilirubin: 0.3 mg/dL (ref 0.3–1.2)

## 2012-07-13 LAB — CBC WITH DIFFERENTIAL/PLATELET
Basophils Relative: 1 % (ref 0–1)
Hemoglobin: 14.3 g/dL (ref 12.0–15.0)
Lymphocytes Relative: 36 % (ref 12–46)
Lymphs Abs: 2 10*3/uL (ref 0.7–4.0)
MCHC: 36.8 g/dL — ABNORMAL HIGH (ref 30.0–36.0)
Monocytes Relative: 4 % (ref 3–12)
Neutro Abs: 2.9 10*3/uL (ref 1.7–7.7)
Neutrophils Relative %: 52 % (ref 43–77)
RBC: 4.29 MIL/uL (ref 3.87–5.11)
WBC: 5.6 10*3/uL (ref 4.0–10.5)

## 2012-07-13 LAB — URINALYSIS, ROUTINE W REFLEX MICROSCOPIC
Glucose, UA: 250 mg/dL — AB
Leukocytes, UA: NEGATIVE
Nitrite: NEGATIVE
Protein, ur: 100 mg/dL — AB
Urobilinogen, UA: 0.2 mg/dL (ref 0.0–1.0)
pH: 5 (ref 5.0–8.0)

## 2012-07-13 LAB — URINE MICROSCOPIC-ADD ON

## 2012-07-13 LAB — LIPASE, BLOOD: Lipase: 32 U/L (ref 11–59)

## 2012-07-13 NOTE — ED Provider Notes (Signed)
History     CSN: [id number]  Arrival date & time 07/13/12  1828   None     Chief Complaint  Patient presents with  . Abdominal Pain  . Flank Pain    (Consider location/radiation/quality/duration/timing/severity/associated sxs/prior treatment) HPI History provided by pt.   Pt has had gradually worsening LLQ pain that radiates to L flank x 1 weeks.  Constant and sharp.  Associated w/ chills.  Denies N/V/D, hematemesis/hematochezia/melena and urinary sx.  Has bleeding from vagina when she has a BM but believes this is secondary to menopause.  Past abd surgeries include c-section and cholecystectomy.  H/o CAD, diabetes and HTN.  Past Medical History  Diagnosis Date  . Angina   . Asthma   . Anxiety   . Headache   . GERD (gastroesophageal reflux disease)   . Coronary artery disease   . Hypertension   . CHF (congestive heart failure)   . Myocardial infarction   . Peripheral vascular disease   . Pneumonia   . Diabetes mellitus   . Anemia     Past Surgical History  Procedure Laterality Date  . Coronary artery bypass graft    . Breast surgery    . Cardiac catheterization      History reviewed. No pertinent family history.  History  Substance Use Topics  . Smoking status: Former Smoker -- 1.00 packs/day for 25 years    Types: Cigarettes    Quit date: 04/04/2000  . Smokeless tobacco: Never Used  . Alcohol Use: Yes     Comment: occasional drinker (holidays)    OB History   Grav Para Term Preterm Abortions TAB SAB Ect Mult Living                  Review of Systems  All other systems reviewed and are negative.    Allergies  Penicillins cross reactors and Adhesive  Home Medications   Current Outpatient Rx  Name  Route  Sig  Dispense  Refill  . albuterol (PROVENTIL HFA;VENTOLIN HFA) 108 (90 BASE) MCG/ACT inhaler   Inhalation   Inhale 2 puffs into the lungs every 6 (six) hours as needed. For shortness of breath           . amLODipine (NORVASC) 10 MG  tablet   Oral   Take 10 mg by mouth daily.           [name] aspirin 81 MG tablet   Oral   Take 81 mg by mouth daily.           . benazepril (LOTENSIN) 20 MG tablet   Oral   Take 20 mg by mouth daily.           . citalopram (CELEXA) 20 MG tablet   Oral   Take 20 mg by mouth daily.           . cloNIDine (CATAPRES) 0.2 MG tablet   Oral   Take 0.2 mg by mouth 3 (three) times daily.           . clopidogrel (PLAVIX) 75 MG tablet   Oral   Take 75 mg by mouth daily.         . fenofibrate 54 MG tablet   Oral   Take 54 mg by mouth daily.         . fluticasone (VERAMYST) 27.5 MCG/SPRAY nasal spray   Nasal   Place 2 sprays into the nose daily.           [name]  furosemide (LASIX) 20 MG tablet   Oral   Take 20 mg by mouth 2 (two) times daily.         [name] gabapentin (NEURONTIN) 400 MG capsule   Oral   Take 400 mg by mouth daily.           [name] glimepiride (AMARYL) 4 MG tablet   Oral   Take 4 mg by mouth 2 (two) times daily.           . insulin aspart (NOVOLOG) 100 UNIT/ML injection   Subcutaneous   Inject 60 Units into the skin 2 (two) times daily. Per sliding scale         . isosorbide dinitrate (ISORDIL) 30 MG tablet   Oral   Take 30 mg by mouth 3 (three) times daily.         [name] loratadine (CLARITIN) 10 MG tablet   Oral   Take 10 mg by mouth daily.           . metoprolol (TOPROL-XL) 200 MG 24 hr tablet   Oral   Take 200 mg by mouth daily.           . nitroGLYCERIN (NITROSTAT) 0.4 MG SL tablet   Sublingual   Place 0.4 mg under the tongue every 5 (five) minutes as needed. For chest           . traMADol (ULTRAM) 50 MG tablet   Oral   Take 50 mg by mouth every 8 (eight) hours as needed. For pain/Maximum dose= 8 tablets per day          . traZODone (DESYREL) 50 MG tablet   Oral   Take 50 mg by mouth at bedtime.             BP 188/81  Pulse 85  Temp(Src) 98.5 F (36.9 C) (Oral)  Resp 16  Ht 5' 10.5" (1.791 m)  Wt 240 lb (108.863 kg)   BMI 33.94 kg/m2  SpO2 100%  Physical Exam  Nursing note and vitals reviewed. Constitutional: She is oriented to person, place, and time. She appears well-developed and well-nourished. No distress.  HENT:  Head: Normocephalic and atraumatic.  Eyes:  Normal appearance  Neck: Normal range of motion.  Cardiovascular: Normal rate and regular rhythm.   Pulmonary/Chest: Effort normal and breath sounds normal. No respiratory distress.  Abdominal: Soft. Bowel sounds are normal. She exhibits no mass. There is no rebound and no guarding.  Obese.  Mildly distended.  Diffuse tenderness but worst in LLQ.   Genitourinary:  Mild CVA tenderness bilaterally  Musculoskeletal: Normal range of motion.  Neurological: She is alert and oriented to person, place, and time.  Skin: Skin is warm and dry. No rash noted.  Psychiatric: She has a normal mood and affect. Her behavior is normal.    ED Course  Procedures (including critical care time)  Labs Reviewed  CBC WITH DIFFERENTIAL - Abnormal; Notable for the following:    MCHC 36.8 (*)    Eosinophils Relative 8 (*)    All other components within normal limits  COMPREHENSIVE METABOLIC PANEL - Abnormal; Notable for the following:    Glucose, Bld 279 (*)    Creatinine, Ser 1.37 (*)    Calcium 10.6 (*)    Total Protein 9.0 (*)    GFR calc non Af Amer 42 (*)    GFR calc Af Amer 49 (*)    All other components within normal limits  URINALYSIS, ROUTINE W REFLEX MICROSCOPIC -  Abnormal; Notable for the following:    Glucose, UA 250 (*)    Hgb urine dipstick SMALL (*)    Protein, ur 100 (*)    All other components within normal limits  URINE MICROSCOPIC-ADD ON - Abnormal; Notable for the following:    Squamous Epithelial / LPF FEW (*)    Bacteria, UA FEW (*)    Casts HYALINE CASTS (*)    All other components within normal limits  WET PREP, GENITAL  GC/CHLAMYDIA PROBE AMP  LIPASE, BLOOD   Ct Abdomen Pelvis W Contrast  07/14/2012  *RADIOLOGY REPORT*   Clinical Data: Left lower quadrant abdominal pain and flank pain. Pain and swelling in the left upper quadrant for 1 month.  CT ABDOMEN AND PELVIS WITH CONTRAST  Technique:  Multidetector CT imaging of the abdomen and pelvis was performed following the standard protocol during bolus administration of intravenous contrast.  Contrast: 161mL OMNIPAQUE IOHEXOL 300 MG/ML  SOLN  Comparison: Ultrasound abdomen 05/20/2012  Findings: Mild fibrosis or linear atelectasis in the lung bases. Postoperative changes in the mediastinum.  The gallbladder is not visualized and is likely surgically absent or contracted.  No bile duct dilatation.  Diffuse low attenuation change throughout the liver consistent with fatty infiltration.  No focal lesions are identified.  The pancreas, spleen, adrenal glands, kidneys, inferior vena cava, and retroperitoneal lymph nodes are unremarkable.  Calcification of the abdominal aorta without aneurysm.  The stomach and small bowel are decompressed. Contrast material flows to the colon without evidence of obstruction.  Stool filled colon without distension or wall thickening.  Small umbilical hernia containing fat.  No free fluid or free air in the abdomen.  Pelvis:  The uterus and ovaries are not enlarged.  The bladder wall is not thickened.  Stool in the rectosigmoid colon with scattered diverticula.  No evidence of diverticulitis.  The appendix is not identified.  Degenerative changes in the lumbar spine.  No significant lymphadenopathy in the pelvis.  IMPRESSION: No focal acute process demonstrated in the abdomen or pelvis. Diffuse fatty infiltration of the liver.   Original Report Authenticated By: Lucienne Capers, M.D.      1. Abdominal pain       MDM  57yo F diabetic presents w/ LLQ pain w/ associated vaginal bleeding.  On exam, afebrile, NAD, hypertensive, abd obese, mildly distended but soft, diffuse ttp but worst in LLQ, unremarkable genitalia.  No significant lab findings and CT  abd/pelvis nml.  Results discussed w/ pt.  Pt reports that pain has improved w/ morphine.  She is sitting on edge of bed and appears comfortable.  VSS; hypertensive.  Ran out of hctz 1 month ago and PCP will not refill until she is re-evaluated.  D/c'd home w/ 1 month supply + vicodin and I advised f/u with her PCP as well as gynecology.  Return precautions discussed.        Remer Macho, PA-C 07/14/12 [phone]

## 2012-07-13 NOTE — ED Notes (Signed)
Pt c/o left sided flank and abd pain with distention x 1 week

## 2012-07-14 ENCOUNTER — Emergency Department (HOSPITAL_COMMUNITY): Payer: Medicare Other

## 2012-07-14 DIAGNOSIS — K7689 Other specified diseases of liver: Secondary | ICD-10-CM | POA: Diagnosis not present

## 2012-07-14 LAB — GC/CHLAMYDIA PROBE AMP
CT Probe RNA: NEGATIVE
GC Probe RNA: NEGATIVE

## 2012-07-14 LAB — WET PREP, GENITAL: Clue Cells Wet Prep HPF POC: NONE SEEN

## 2012-07-14 MED ORDER — MORPHINE SULFATE 4 MG/ML IJ SOLN: 4.0000 mg | Freq: Once | INTRAMUSCULAR | Status: DC

## 2012-07-14 MED ORDER — HYDROCODONE-ACETAMINOPHEN 5-325 MG PO TABS: 1.0000 | ORAL_TABLET | ORAL | Status: DC | PRN

## 2012-07-14 MED ORDER — IOHEXOL 300 MG/ML  SOLN
100.0000 mL | Freq: Once | INTRAMUSCULAR | Status: AC | PRN
  Administered 2012-07-14: 100 mL via INTRAVENOUS

## 2012-07-14 MED ORDER — MORPHINE SULFATE 4 MG/ML IJ SOLN
4.0000 mg | Freq: Once | INTRAMUSCULAR | Status: AC
  Administered 2012-07-14: 4 mg via INTRAVENOUS

## 2012-07-14 MED ORDER — MORPHINE SULFATE 4 MG/ML IJ SOLN
INTRAMUSCULAR | Status: AC
  Filled 2012-07-14: qty 1

## 2012-07-14 MED ORDER — IOHEXOL 300 MG/ML  SOLN
50.0000 mL | Freq: Once | INTRAMUSCULAR | Status: AC | PRN
  Administered 2012-07-14: 50 mL via ORAL

## 2012-07-14 NOTE — ED Provider Notes (Signed)
Medical screening examination/treatment/procedure(s) were performed by non-physician practitioner and as supervising physician I was immediately available for consultation/collaboration.   Mervin Kung, MD 07/14/12 [phone]

## 2012-07-14 NOTE — ED Notes (Signed)
The patient is AOx4 and comfortable with her discharge instructions. 

## 2012-07-17 DIAGNOSIS — I10 Essential (primary) hypertension: Secondary | ICD-10-CM | POA: Diagnosis not present

## 2012-07-17 DIAGNOSIS — E1129 Type 2 diabetes mellitus with other diabetic kidney complication: Secondary | ICD-10-CM | POA: Diagnosis not present

## 2012-07-17 DIAGNOSIS — N2581 Secondary hyperparathyroidism of renal origin: Secondary | ICD-10-CM | POA: Diagnosis not present

## 2012-07-17 DIAGNOSIS — D509 Iron deficiency anemia, unspecified: Secondary | ICD-10-CM | POA: Diagnosis not present

## 2012-07-20 DIAGNOSIS — J45909 Unspecified asthma, uncomplicated: Secondary | ICD-10-CM | POA: Diagnosis not present

## 2012-07-20 DIAGNOSIS — I10 Essential (primary) hypertension: Secondary | ICD-10-CM | POA: Diagnosis not present

## 2012-07-20 DIAGNOSIS — F411 Generalized anxiety disorder: Secondary | ICD-10-CM | POA: Diagnosis not present

## 2012-07-20 DIAGNOSIS — E1159 Type 2 diabetes mellitus with other circulatory complications: Secondary | ICD-10-CM | POA: Diagnosis not present

## 2012-07-20 DIAGNOSIS — N19 Unspecified kidney failure: Secondary | ICD-10-CM | POA: Diagnosis not present

## 2012-07-20 DIAGNOSIS — R1013 Epigastric pain: Secondary | ICD-10-CM | POA: Diagnosis not present

## 2012-07-20 DIAGNOSIS — I2510 Atherosclerotic heart disease of native coronary artery without angina pectoris: Secondary | ICD-10-CM | POA: Diagnosis not present

## 2012-07-20 DIAGNOSIS — E119 Type 2 diabetes mellitus without complications: Secondary | ICD-10-CM | POA: Diagnosis not present

## 2012-07-20 DIAGNOSIS — M545 Low back pain: Secondary | ICD-10-CM | POA: Diagnosis not present

## 2012-07-20 DIAGNOSIS — K3189 Other diseases of stomach and duodenum: Secondary | ICD-10-CM | POA: Diagnosis not present

## 2012-07-20 DIAGNOSIS — E559 Vitamin D deficiency, unspecified: Secondary | ICD-10-CM | POA: Diagnosis not present

## 2012-07-21 DIAGNOSIS — E1129 Type 2 diabetes mellitus with other diabetic kidney complication: Secondary | ICD-10-CM | POA: Diagnosis not present

## 2012-07-21 DIAGNOSIS — D509 Iron deficiency anemia, unspecified: Secondary | ICD-10-CM | POA: Diagnosis not present

## 2012-07-21 DIAGNOSIS — I10 Essential (primary) hypertension: Secondary | ICD-10-CM | POA: Diagnosis not present

## 2012-08-11 DIAGNOSIS — I10 Essential (primary) hypertension: Secondary | ICD-10-CM | POA: Diagnosis not present

## 2012-08-16 ENCOUNTER — Emergency Department (HOSPITAL_COMMUNITY)
Admission: EM | Admit: 2012-08-16 | Discharge: 2012-08-16 | Disposition: A | Discharge status: Home / Self Care | Payer: Medicare Other | Attending: Emergency Medicine | Admitting: Emergency Medicine

## 2012-08-16 ENCOUNTER — Encounter (HOSPITAL_COMMUNITY): Payer: Self-pay | Admitting: *Deleted

## 2012-08-16 DIAGNOSIS — F411 Generalized anxiety disorder: Secondary | ICD-10-CM | POA: Diagnosis not present

## 2012-08-16 DIAGNOSIS — Z79899 Other long term (current) drug therapy: Secondary | ICD-10-CM | POA: Diagnosis not present

## 2012-08-16 DIAGNOSIS — Z8669 Personal history of other diseases of the nervous system and sense organs: Secondary | ICD-10-CM | POA: Diagnosis not present

## 2012-08-16 DIAGNOSIS — Z862 Personal history of diseases of the blood and blood-forming organs and certain disorders involving the immune mechanism: Secondary | ICD-10-CM | POA: Insufficient documentation

## 2012-08-16 DIAGNOSIS — E119 Type 2 diabetes mellitus without complications: Secondary | ICD-10-CM | POA: Diagnosis not present

## 2012-08-16 DIAGNOSIS — IMO0002 Reserved for concepts with insufficient information to code with codable children: Secondary | ICD-10-CM | POA: Insufficient documentation

## 2012-08-16 DIAGNOSIS — F329 Major depressive disorder, single episode, unspecified: Secondary | ICD-10-CM

## 2012-08-16 DIAGNOSIS — Z8701 Personal history of pneumonia (recurrent): Secondary | ICD-10-CM | POA: Diagnosis not present

## 2012-08-16 DIAGNOSIS — F3289 Other specified depressive episodes: Secondary | ICD-10-CM | POA: Insufficient documentation

## 2012-08-16 DIAGNOSIS — Z87891 Personal history of nicotine dependence: Secondary | ICD-10-CM | POA: Insufficient documentation

## 2012-08-16 DIAGNOSIS — I10 Essential (primary) hypertension: Secondary | ICD-10-CM | POA: Diagnosis not present

## 2012-08-16 DIAGNOSIS — Z7982 Long term (current) use of aspirin: Secondary | ICD-10-CM | POA: Insufficient documentation

## 2012-08-16 DIAGNOSIS — Z8679 Personal history of other diseases of the circulatory system: Secondary | ICD-10-CM | POA: Insufficient documentation

## 2012-08-16 DIAGNOSIS — Z880 Allergy status to penicillin: Secondary | ICD-10-CM | POA: Insufficient documentation

## 2012-08-16 DIAGNOSIS — Z8719 Personal history of other diseases of the digestive system: Secondary | ICD-10-CM | POA: Insufficient documentation

## 2012-08-16 DIAGNOSIS — R42 Dizziness and giddiness: Secondary | ICD-10-CM | POA: Insufficient documentation

## 2012-08-16 DIAGNOSIS — Z794 Long term (current) use of insulin: Secondary | ICD-10-CM | POA: Insufficient documentation

## 2012-08-16 DIAGNOSIS — I252 Old myocardial infarction: Secondary | ICD-10-CM | POA: Diagnosis not present

## 2012-08-16 DIAGNOSIS — Z9889 Other specified postprocedural states: Secondary | ICD-10-CM | POA: Insufficient documentation

## 2012-08-16 DIAGNOSIS — I739 Peripheral vascular disease, unspecified: Secondary | ICD-10-CM | POA: Insufficient documentation

## 2012-08-16 DIAGNOSIS — J45909 Unspecified asthma, uncomplicated: Secondary | ICD-10-CM | POA: Diagnosis not present

## 2012-08-16 DIAGNOSIS — Z951 Presence of aortocoronary bypass graft: Secondary | ICD-10-CM | POA: Insufficient documentation

## 2012-08-16 DIAGNOSIS — Z7902 Long term (current) use of antithrombotics/antiplatelets: Secondary | ICD-10-CM | POA: Insufficient documentation

## 2012-08-16 DIAGNOSIS — I2510 Atherosclerotic heart disease of native coronary artery without angina pectoris: Secondary | ICD-10-CM | POA: Insufficient documentation

## 2012-08-16 DIAGNOSIS — I509 Heart failure, unspecified: Secondary | ICD-10-CM | POA: Diagnosis not present

## 2012-08-16 MED ORDER — SERTRALINE HCL 50 MG PO TABS: 50.0000 mg | ORAL_TABLET | Freq: Every day | ORAL | Status: DC

## 2012-08-16 NOTE — ED Notes (Signed)
Pt states she recently stopped taking her anti-depressant medication she's been on since 2006 d/t nightmares, states now she feels dizzy, and "creepy", states doesn't feel right.

## 2012-08-16 NOTE — ED Provider Notes (Addendum)
History     CSN: [id number]  Arrival date & time 08/16/12  1406   First MD Initiated Contact with Patient 08/16/12 1511      Chief Complaint  Patient presents with  . Dizziness     The history is provided by the patient.   the patient was taking Celexa for depression she states she stopped this 2 weeks ago because she was having nightmares.  Now she states she doesn't feel right he is having episodes of dizziness and feels "creepy".  She is requesting a new medication for her depression.  She has not spoken with her primary care physician regarding the symptoms that the Celexa was giving her.  She reports that her nightmares have now resolved since stopping the Celexa.  She is taking Zoloft and it seemed to help her depression a lot and she was switched to Celexa to choose not sure why she was switched to Celexa as she always tolerated Zoloft well.  No weakness of her upper lower extremities.  No chest pain shortness of breath.  No abdominal pain.  No nausea vomiting or diarrhea.  She's compliant with all her other medications.  No other complaints.  Symptoms are mild in severity  Past Medical History  Diagnosis Date  . Angina   . Asthma   . Anxiety   . Headache   . GERD (gastroesophageal reflux disease)   . Coronary artery disease   . Hypertension   . CHF (congestive heart failure)   . Myocardial infarction   . Peripheral vascular disease   . Pneumonia   . Diabetes mellitus   . Anemia     Past Surgical History  Procedure Laterality Date  . Coronary artery bypass graft    . Breast surgery    . Cardiac catheterization      No family history on file.  History  Substance Use Topics  . Smoking status: Former Smoker -- 1.00 packs/day for 25 years    Types: Cigarettes    Quit date: 04/04/2000  . Smokeless tobacco: Never Used  . Alcohol Use: Yes     Comment: occasional drinker (holidays)    OB History   Grav Para Term Preterm Abortions TAB SAB Ect Mult Living         Review of Systems  All other systems reviewed and are negative.    Allergies  Penicillins cross reactors and Adhesive  Home Medications   Current Outpatient Rx  Name  Route  Sig  Dispense  Refill  . albuterol (PROVENTIL HFA;VENTOLIN HFA) 108 (90 BASE) MCG/ACT inhaler   Inhalation   Inhale 2 puffs into the lungs every 6 (six) hours as needed. For shortness of breath           . amLODipine (NORVASC) 10 MG tablet   Oral   Take 10 mg by mouth daily.           [name] aspirin 81 MG tablet   Oral   Take 81 mg by mouth daily.           . cloNIDine (CATAPRES) 0.2 MG tablet   Oral   Take 0.2 mg by mouth 3 (three) times daily.           . clopidogrel (PLAVIX) 75 MG tablet   Oral   Take 75 mg by mouth daily.         . fluticasone (VERAMYST) 27.5 MCG/SPRAY nasal spray   Nasal   Place 2 sprays into the nose daily.           [name]  furosemide (LASIX) 20 MG tablet   Oral   Take 20 mg by mouth 2 (two) times daily.         [name] gabapentin (NEURONTIN) 400 MG capsule   Oral   Take 400 mg by mouth daily.           [name] glimepiride (AMARYL) 4 MG tablet   Oral   Take 4 mg by mouth 2 (two) times daily.           [name] HYDROcodone-acetaminophen (NORCO/VICODIN) 5-325 MG per tablet   Oral   Take 1 tablet by mouth every 4 (four) hours as needed for pain.   20 tablet   0   . insulin aspart (NOVOLOG) 100 UNIT/ML injection   Subcutaneous   Inject 60 Units into the skin 2 (two) times daily. Per sliding scale         . isosorbide dinitrate (ISORDIL) 30 MG tablet   Oral   Take 30 mg by mouth 3 (three) times daily.         . metoprolol (TOPROL-XL) 200 MG 24 hr tablet   Oral   Take 200 mg by mouth daily.           . traMADol (ULTRAM) 50 MG tablet   Oral   Take 50 mg by mouth every 8 (eight) hours as needed. For pain/Maximum dose= 8 tablets per day          . traZODone (DESYREL) 50 MG tablet   Oral   Take 50 mg by mouth at bedtime.           . citalopram  (CELEXA) 20 MG tablet   Oral   Take 20 mg by mouth daily.           . nitroGLYCERIN (NITROSTAT) 0.4 MG SL tablet   Sublingual   Place 0.4 mg under the tongue every 5 (five) minutes as needed. For chest           . sertraline (ZOLOFT) 50 MG tablet   Oral   Take 1 tablet (50 mg total) by mouth daily.   30 tablet   0     BP 153/78  Pulse 77  Temp(Src) 98.2 F (36.8 C) (Oral)  Resp 16  SpO2 99%  Physical Exam  Nursing note and vitals reviewed. Constitutional: She is oriented to person, place, and time. She appears well-developed and well-nourished. No distress.  HENT:  Head: Normocephalic and atraumatic.  Eyes: EOM are normal.  Neck: Normal range of motion.  Cardiovascular: Normal rate, regular rhythm and normal heart sounds.   Pulmonary/Chest: Effort normal and breath sounds normal.  Abdominal: Soft. She exhibits no distension. There is no tenderness.  Musculoskeletal: Normal range of motion.  Neurological: She is alert and oriented to person, place, and time.  Skin: Skin is warm and dry.  Psychiatric: She has a normal mood and affect. Judgment normal.    ED Course  Procedures (including critical care time)  Labs Reviewed - No data to display No results found.   1. Depression       MDM  The patient will be started back on her Zoloft.  A vas that she call her primary care physician.  Medical screening examination completed.  Nothing to suggest stroke        Hoy Morn, MD 08/16/12 [place], MD 08/16/12 1600

## 2012-08-18 DIAGNOSIS — M545 Low back pain: Secondary | ICD-10-CM | POA: Diagnosis not present

## 2012-08-18 DIAGNOSIS — J45909 Unspecified asthma, uncomplicated: Secondary | ICD-10-CM | POA: Diagnosis not present

## 2012-08-18 DIAGNOSIS — K3189 Other diseases of stomach and duodenum: Secondary | ICD-10-CM | POA: Diagnosis not present

## 2012-08-18 DIAGNOSIS — N19 Unspecified kidney failure: Secondary | ICD-10-CM | POA: Diagnosis not present

## 2012-08-18 DIAGNOSIS — E119 Type 2 diabetes mellitus without complications: Secondary | ICD-10-CM | POA: Diagnosis not present

## 2012-08-18 DIAGNOSIS — R1013 Epigastric pain: Secondary | ICD-10-CM | POA: Diagnosis not present

## 2012-08-18 DIAGNOSIS — F411 Generalized anxiety disorder: Secondary | ICD-10-CM | POA: Diagnosis not present

## 2012-08-18 DIAGNOSIS — I10 Essential (primary) hypertension: Secondary | ICD-10-CM | POA: Diagnosis not present

## 2012-08-18 DIAGNOSIS — E039 Hypothyroidism, unspecified: Secondary | ICD-10-CM | POA: Diagnosis not present

## 2012-08-28 DIAGNOSIS — F411 Generalized anxiety disorder: Secondary | ICD-10-CM | POA: Diagnosis not present

## 2012-08-28 DIAGNOSIS — E039 Hypothyroidism, unspecified: Secondary | ICD-10-CM | POA: Diagnosis not present

## 2012-08-28 DIAGNOSIS — K3189 Other diseases of stomach and duodenum: Secondary | ICD-10-CM | POA: Diagnosis not present

## 2012-08-28 DIAGNOSIS — R1013 Epigastric pain: Secondary | ICD-10-CM | POA: Diagnosis not present

## 2012-08-28 DIAGNOSIS — Q828 Other specified congenital malformations of skin: Secondary | ICD-10-CM | POA: Diagnosis not present

## 2012-08-28 DIAGNOSIS — R609 Edema, unspecified: Secondary | ICD-10-CM | POA: Diagnosis not present

## 2012-08-28 DIAGNOSIS — E785 Hyperlipidemia, unspecified: Secondary | ICD-10-CM | POA: Diagnosis not present

## 2012-08-28 DIAGNOSIS — I10 Essential (primary) hypertension: Secondary | ICD-10-CM | POA: Diagnosis not present

## 2012-08-28 DIAGNOSIS — E119 Type 2 diabetes mellitus without complications: Secondary | ICD-10-CM | POA: Diagnosis not present

## 2012-09-10 ENCOUNTER — Other Ambulatory Visit: Payer: Self-pay

## 2012-09-10 NOTE — Telephone Encounter (Signed)
Please advise 

## 2012-09-11 DIAGNOSIS — F411 Generalized anxiety disorder: Secondary | ICD-10-CM | POA: Diagnosis not present

## 2012-09-11 DIAGNOSIS — E119 Type 2 diabetes mellitus without complications: Secondary | ICD-10-CM | POA: Diagnosis not present

## 2012-09-11 DIAGNOSIS — E039 Hypothyroidism, unspecified: Secondary | ICD-10-CM | POA: Diagnosis not present

## 2012-09-11 DIAGNOSIS — K3189 Other diseases of stomach and duodenum: Secondary | ICD-10-CM | POA: Diagnosis not present

## 2012-09-11 DIAGNOSIS — M25579 Pain in unspecified ankle and joints of unspecified foot: Secondary | ICD-10-CM | POA: Diagnosis not present

## 2012-09-11 DIAGNOSIS — R609 Edema, unspecified: Secondary | ICD-10-CM | POA: Diagnosis not present

## 2012-09-11 DIAGNOSIS — E785 Hyperlipidemia, unspecified: Secondary | ICD-10-CM | POA: Diagnosis not present

## 2012-09-11 DIAGNOSIS — R1013 Epigastric pain: Secondary | ICD-10-CM | POA: Diagnosis not present

## 2012-09-11 DIAGNOSIS — Q828 Other specified congenital malformations of skin: Secondary | ICD-10-CM | POA: Diagnosis not present

## 2012-09-11 MED ORDER — TRAMADOL HCL 50 MG PO TABS: 50.0000 mg | ORAL_TABLET | Freq: Three times a day (TID) | ORAL | Status: DC | PRN

## 2012-09-22 ENCOUNTER — Ambulatory Visit (INDEPENDENT_AMBULATORY_CARE_PROVIDER_SITE_OTHER): Payer: Medicare Other | Admitting: Cardiology

## 2012-09-22 ENCOUNTER — Encounter: Payer: Self-pay | Admitting: Cardiology

## 2012-09-22 VITALS — BP 150/80 | HR 81 | Ht 70.5 in | Wt 251.0 lb

## 2012-09-22 DIAGNOSIS — I739 Peripheral vascular disease, unspecified: Secondary | ICD-10-CM

## 2012-09-22 DIAGNOSIS — I10 Essential (primary) hypertension: Secondary | ICD-10-CM

## 2012-09-22 DIAGNOSIS — R079 Chest pain, unspecified: Secondary | ICD-10-CM | POA: Diagnosis not present

## 2012-09-22 DIAGNOSIS — E1129 Type 2 diabetes mellitus with other diabetic kidney complication: Secondary | ICD-10-CM

## 2012-09-22 DIAGNOSIS — N189 Chronic kidney disease, unspecified: Secondary | ICD-10-CM

## 2012-09-22 DIAGNOSIS — Z951 Presence of aortocoronary bypass graft: Secondary | ICD-10-CM

## 2012-09-22 DIAGNOSIS — E1122 Type 2 diabetes mellitus with diabetic chronic kidney disease: Secondary | ICD-10-CM

## 2012-09-22 DIAGNOSIS — E785 Hyperlipidemia, unspecified: Secondary | ICD-10-CM

## 2012-09-22 DIAGNOSIS — I2510 Atherosclerotic heart disease of native coronary artery without angina pectoris: Secondary | ICD-10-CM | POA: Diagnosis not present

## 2012-09-22 NOTE — Patient Instructions (Addendum)
Have stress test and dopplers done, then return for follow up

## 2012-09-22 NOTE — Assessment & Plan Note (Signed)
Acceptable control 

## 2012-09-22 NOTE — Assessment & Plan Note (Signed)
No recent SCr on record, labs done recently at Dr Osei-Bonsu's office

## 2012-09-22 NOTE — Progress Notes (Signed)
09/22/2012 Brooke Stafford   14-Oct-1955  [id number]  Primary Physicia OSEI-BONSU,GEORGE, MD Primary Cardiologist: Dr Sallyanne Kuster  HPI:  The patient is a 57 year old woman with severe type 2 diabetes mellitus requiring insulin therapy, systemic hypertension, hyperlipidemia, obesity, PVD S/P LSFA PTA Dec 2012, coronary artery disease, S/P CABG x 6 in 2002. Cardiac catheterization was done in July 2011 when she presented with sepsis and medication non compliance and had an NSTEMI from demand ischemia. Cath then showed a patent LIMA to LAD, patent SVG to diagonal, and patent sequential SVG to branches of the right coronary artery. The plan was for medical Rx. She has a mild ischemic cardiomyopathy with a left ventricular ejection fraction of 45% to 50% by echo 5/11.           We last saw her in Oct 2013. She presents now with multiple complaints. She says she has had Rt foot, leg, groin, and hip pain. She has chronic edema in that leg from her venectomy. She also complains of vague chest discomfort and a cough. She says she has seen her primary care about this and labs and XR have been done. We have requested those results.    Current Outpatient Prescriptions  Medication Sig Dispense Refill  . albuterol (PROVENTIL HFA;VENTOLIN HFA) 108 (90 BASE) MCG/ACT inhaler Inhale 2 puffs into the lungs every 6 (six) hours as needed. For shortness of breath        . amLODipine (NORVASC) 10 MG tablet Take 10 mg by mouth daily.        [name] aspirin 81 MG tablet Take 81 mg by mouth daily.        . cloNIDine (CATAPRES) 0.2 MG tablet Take 0.2 mg by mouth daily.       . clopidogrel (PLAVIX) 75 MG tablet Take 75 mg by mouth daily.      . fluticasone (VERAMYST) 27.5 MCG/SPRAY nasal spray Place 2 sprays into the nose daily.        . furosemide (LASIX) 20 MG tablet Take 20 mg by mouth daily.       [name] gabapentin (NEURONTIN) 400 MG capsule Take 400 mg by mouth daily.        [name] glimepiride (AMARYL) 4 MG tablet Take 4 mg by mouth 2  (two) times daily.        . insulin aspart (NOVOLOG) 100 UNIT/ML injection Inject 60 Units into the skin 2 (two) times daily. Per sliding scale      . isosorbide dinitrate (ISORDIL) 30 MG tablet Take 30 mg by mouth 3 (three) times daily.      [name] levothyroxine (SYNTHROID, LEVOTHROID) 25 MCG tablet Take 25 mcg by mouth daily before breakfast.      . metoprolol (TOPROL-XL) 200 MG 24 hr tablet Take 200 mg by mouth daily.        . nitroGLYCERIN (NITROSTAT) 0.4 MG SL tablet Place 0.4 mg under the tongue every 5 (five) minutes as needed. For chest        . traMADol (ULTRAM) 50 MG tablet Take 1 tablet (50 mg total) by mouth every 8 (eight) hours as needed. For pain/Maximum dose= 8 tablets per day  30 tablet  3   No current facility-administered medications for this visit.    Allergies  Allergen Reactions  . Penicillins Cross Reactors Hives    And high fever  . Adhesive (Tape) Rash    bruising    History   Social History  . Marital Status: Divorced  Spouse Name: N/A    Number of Children: N/A  . Years of Education: N/A   Occupational History  . Not on file.   Social History Main Topics  . Smoking status: Former Smoker -- 1.00 packs/day for 25 years    Types: Cigarettes    Quit date: 04/04/2000  . Smokeless tobacco: Never Used  . Alcohol Use: Yes     Comment: occasional drinker (holidays)  . Drug Use: No  . Sexually Active: No   Other Topics Concern  . Not on file   Social History Narrative  . No narrative on file     Review of Systems: General: negative for chills, fever, night sweats or weight changes.  Dermatological: negative for rash Respiratory: negative for cough or wheezing Urologic: negative for hematuria Abdominal: negative for nausea, vomiting, diarrhea, bright red blood per rectum, melena, or hematemesis Neurologic: negative for visual changes, syncope, or dizziness All other systems reviewed and are otherwise negative except as noted above.    Blood  pressure 150/80, pulse 81, height 5' 10.5" (1.791 m), weight 251 lb (113.853 kg).  General appearance: alert, cooperative, no distress and morbidly obese Lungs: clear to auscultation bilaterally and no wheezing or rales Heart: regular rate and rhythm and no murmur Extrem: Trace edema Rt leg with diminnished distal pulses. No RFA widened pulsation or bruit. LLE withour edema, intact DP pulse.  EKG  EKG: normal EKG, normal sinus rhythm, unchanged from previous tracings.  ASSESSMENT AND PLAN:   PVD, LSFA PTA 12/12 Today she is complaining of Rt foot, groin, hip pain when walking  Hx of CABG x 6 2002, cath 2011 patent grafts, medical Rx Vague chest discomfort. Also cough and SOB  HTN (hypertension), malignant, patent renal arteries Acceptable control  Dyslipidemia IDDM  Diabetes mellitus with chronic kidney disease No recent SCr on record, labs done recently at Dr Osei-Bonsu's office   PLAN  We have requested records from Dr Osei-Bonui's office. I scheduled her for a Lexiscan and LEA dopplers.   [facility] KPA-C 09/22/2012 2:43 PM

## 2012-09-22 NOTE — Assessment & Plan Note (Signed)
Today she is complaining of Rt foot, groin, hip pain when walking

## 2012-09-22 NOTE — Assessment & Plan Note (Signed)
Vague chest discomfort. Also cough and SOB

## 2012-09-22 NOTE — Assessment & Plan Note (Signed)
IDDM 

## 2012-09-25 ENCOUNTER — Ambulatory Visit (HOSPITAL_COMMUNITY)
Admission: RE | Admit: 2012-09-25 | Discharge: 2012-09-25 | Disposition: A | Discharge status: Home / Self Care | Payer: Medicare Other | Source: Ambulatory Visit | Attending: Cardiovascular Disease | Admitting: Cardiovascular Disease

## 2012-09-25 ENCOUNTER — Ambulatory Visit: Payer: Medicare Other | Admitting: Cardiovascular Disease

## 2012-09-25 DIAGNOSIS — R0602 Shortness of breath: Secondary | ICD-10-CM | POA: Insufficient documentation

## 2012-09-25 DIAGNOSIS — R55 Syncope and collapse: Secondary | ICD-10-CM | POA: Diagnosis not present

## 2012-09-25 DIAGNOSIS — Z951 Presence of aortocoronary bypass graft: Secondary | ICD-10-CM | POA: Insufficient documentation

## 2012-09-25 DIAGNOSIS — R5381 Other malaise: Secondary | ICD-10-CM | POA: Insufficient documentation

## 2012-09-25 DIAGNOSIS — R079 Chest pain, unspecified: Secondary | ICD-10-CM | POA: Insufficient documentation

## 2012-09-25 DIAGNOSIS — I2510 Atherosclerotic heart disease of native coronary artery without angina pectoris: Secondary | ICD-10-CM | POA: Diagnosis not present

## 2012-09-25 DIAGNOSIS — R5383 Other fatigue: Secondary | ICD-10-CM | POA: Insufficient documentation

## 2012-09-25 MED ORDER — REGADENOSON 0.4 MG/5ML IV SOLN
0.4000 mg | Freq: Once | INTRAVENOUS | Status: AC
  Administered 2012-09-25: 0.4 mg via INTRAVENOUS

## 2012-09-25 MED ORDER — TECHNETIUM TC 99M SESTAMIBI GENERIC - CARDIOLITE
31.7000 | Freq: Once | INTRAVENOUS | Status: AC | PRN
  Administered 2012-09-25: 31.7 via INTRAVENOUS

## 2012-09-25 MED ORDER — TECHNETIUM TC 99M SESTAMIBI GENERIC - CARDIOLITE
11.0000 | Freq: Once | INTRAVENOUS | Status: AC | PRN
  Administered 2012-09-25: 11 via INTRAVENOUS

## 2012-09-25 NOTE — Procedures (Addendum)
[facility] [address] [place] [address] [id number]  Cardiology Nuclear Med Study  Brooke Stafford is a 57 y.o. female     MRN : [id number]     DOB: 09-Apr-1956  Procedure Date: 09/25/2012  Nuclear Med Background Indication for Stress Test:  Graft Patency, Stent Patency and PTCA Patency History:  Asthma and CAD;CABG X6--2002;STENT/PTCA--2011 AND 2012 Cardiac Risk Factors: Claudication, Family History - CAD, History of Smoking, Hypertension, IDDM Type 2, Lipids, Obesity and PVD  Symptoms:  Chest Pain, Dizziness, Fatigue and SOB   Nuclear Pre-Procedure Caffeine/Decaff Intake:  1:00am NPO After: 11 AM   IV Site: R Forearm  IV 0.9% NS with Angio Cath:  22g  Chest Size (in):  N/A IV Started by: Azucena Cecil, RN  Height: 5\' 10"  (1.778 m)  Cup Size: C  BMI:  Body mass index is 36.01 kg/(m^2). Weight:  251 lb (113.853 kg)   Tech Comments:  N/A    Nuclear Med Study 1 or 2 day study: 1 day  Stress Test Type:  Walker Provider:  Sanda Klein, MD   Resting Radionuclide: Technetium 6m Sestamibi  Resting Radionuclide Dose: 11.0 mCi   Stress Radionuclide:  Technetium 49m Sestamibi  Stress Radionuclide Dose: 31.7 mCi           Stress Protocol Rest HR: 67 Stress HR: 90  Rest BP: 146/84 Stress BP: 153/110  Exercise Time (min): n/a METS: n/a   Predicted Max HR: 162 bpm % Max HR: 55.56 bpm Rate Pressure Product: [id number]  Dose of Adenosine (mg):  n/a Dose of Lexiscan: 0.4 mg  Dose of Atropine (mg): n/a Dose of Dobutamine: n/a mcg/kg/min (at max HR)  Stress Test Technologist: Leane Para, CCT Nuclear Technologist: Imagene Riches, CNMT   Rest Procedure:  Myocardial perfusion imaging was performed at rest 45 minutes following the intravenous administration of Technetium 74m Sestamibi. Stress Procedure:  The patient received IV Lexiscan 0.4 mg over 15-seconds.  Technetium 58m Sestamibi injected at  30-seconds.  There were no significant changes with Lexiscan.  Quantitative spect images were obtained after a 45 minute delay.  Transient Ischemic Dilatation (Normal <1.22):  1.03 Lung/Heart Ratio (Normal <0.45):  0.26 QGS EDV:  124 ml QGS ESV:  47 ml LV Ejection Fraction: 62%      Rest ECG: NSR-LVH  Stress ECG: No significant change from baseline ECG  QPS Raw Data Images:  Normal; no motion artifact; normal heart/lung ratio. Stress Images:  There is decreased uptake in the inferior wall. Rest Images:  There is decreased uptake in the inferior wall. Subtraction (SDS):  There is a fixed inferior defect that is most consistent with diaphragmatic attenuation.  Impression Exercise Capacity:  Lexiscan with no exercise. BP Response:  Hypertensive blood pressure response. Clinical Symptoms:  No significant symptoms noted. ECG Impression:  No significant ST segment change suggestive of ischemia. Comparison with Prior Nuclear Study: No previous nuclear study performed  Overall Impression:  Low risk stress nuclear study Inferior scar, consistent with prior infarction in the right coronary artery distribution. No reversible ischemia..  LV Wall Motion:  Normal overall systolic function. Mild inferior wall hypokinesis. Paradoxical septal motion.   Sanda Klein, MD  09/25/2012 6:06 PM

## 2012-09-29 ENCOUNTER — Telehealth: Payer: Self-pay | Admitting: *Deleted

## 2012-09-29 NOTE — Telephone Encounter (Signed)
LMOM-Results of nuclear study called to patient.

## 2012-09-29 NOTE — Telephone Encounter (Signed)
Message copied by Tressa Busman on Tue Sep 29, 2012  1:22 PM ------      Message from: Sanda Klein      Created: Tue Sep 29, 2012  8:11 AM       Small scar of old heart attack, otherwise OK/low risk. Please notify. ------

## 2012-10-02 ENCOUNTER — Telehealth: Payer: Self-pay | Admitting: *Deleted

## 2012-10-02 ENCOUNTER — Ambulatory Visit (HOSPITAL_COMMUNITY)
Admission: RE | Admit: 2012-10-02 | Discharge: 2012-10-02 | Disposition: A | Discharge status: Home / Self Care | Payer: Medicare Other | Source: Ambulatory Visit | Attending: Internal Medicine | Admitting: Internal Medicine

## 2012-10-02 DIAGNOSIS — I739 Peripheral vascular disease, unspecified: Secondary | ICD-10-CM | POA: Insufficient documentation

## 2012-10-02 DIAGNOSIS — Z09 Encounter for follow-up examination after completed treatment for conditions other than malignant neoplasm: Secondary | ICD-10-CM | POA: Diagnosis not present

## 2012-10-02 DIAGNOSIS — M79609 Pain in unspecified limb: Secondary | ICD-10-CM | POA: Diagnosis not present

## 2012-10-02 DIAGNOSIS — I70219 Atherosclerosis of native arteries of extremities with intermittent claudication, unspecified extremity: Secondary | ICD-10-CM

## 2012-10-02 NOTE — Telephone Encounter (Signed)
Patient had lower extremity dopplers and wanted to talk to a nurse after the doppler.  I spoke with Rita-no vascular issues with right leg.  I spoke with Luke-same symptoms as office visit, study ok, contact primary MD ?gout.  I advised patient to speak with primary MD.  Pt agreeable

## 2012-10-02 NOTE — Progress Notes (Signed)
Arterial Duplex Lower Ext. Completed. Kawon Willcutt, RDMS, RVT  

## 2012-10-09 ENCOUNTER — Telehealth: Payer: Self-pay | Admitting: Cardiovascular Disease

## 2012-10-09 NOTE — Telephone Encounter (Signed)
Reviewed doppler results with patient

## 2012-10-09 NOTE — Telephone Encounter (Signed)
Returning your call. °

## 2012-10-12 ENCOUNTER — Ambulatory Visit: Payer: Medicare Other | Admitting: Cardiology

## 2012-10-21 ENCOUNTER — Encounter: Payer: Self-pay | Admitting: Cardiovascular Disease

## 2012-10-22 ENCOUNTER — Ambulatory Visit (INDEPENDENT_AMBULATORY_CARE_PROVIDER_SITE_OTHER): Payer: Medicare Other | Admitting: Cardiovascular Disease

## 2012-10-22 ENCOUNTER — Encounter: Payer: Self-pay | Admitting: Cardiovascular Disease

## 2012-10-22 VITALS — BP 144/86 | HR 68 | Ht 70.5 in | Wt 243.4 lb

## 2012-10-22 DIAGNOSIS — I739 Peripheral vascular disease, unspecified: Secondary | ICD-10-CM

## 2012-10-22 NOTE — Assessment & Plan Note (Signed)
Status post peripheral angiography, PTCA and stenting of her left SFA 04/05/11 by myself using a Nitinol self-expanding stent. She did have moderate segmental mid right SFA disease. Followup Dopplers recently performed in our office 10/02/12 revealed a right ABI 0.85 with moderate SFA disease the left ABI of 0.56 with what appears to be an occluded left SFA stent. She really denies claudication. At this point we'll continue to follow her noninvasively. There is no indication for re\re intervention.

## 2012-10-22 NOTE — Patient Instructions (Addendum)
Your physician recommends that you schedule a follow-up appointment in: September with Dr C  Your physician has requested that you have a lower extremity arterial exercise duplex. During this test, exercise and ultrasound are used to evaluate arterial blood flow in the legs. Allow one hour for this exam. There are no restrictions or special instructions. In 6 months

## 2012-10-22 NOTE — Progress Notes (Signed)
10/22/2012 Brooke Stafford   06-03-1955  [id number]  Primary Physician Benito Mccreedy, MD Primary Cardiologist: Lorretta Harp MD Brooke Stafford   HPI:  The patient is a 57 year old woman with severe type 2 diabetes mellitus requiring insulin therapy, systemic hypertension, hyperlipidemia, obesity, PVD S/P LSFA PTA Dec 2012, coronary artery disease, S/P CABG x 6 in 2002. Cardiac catheterization was done in July 2011 when she presented with sepsis and medication non compliance and had an NSTEMI from demand ischemia. Cath then showed a patent LIMA to LAD, patent SVG to diagonal, and patent sequential SVG to branches of the right coronary artery. The plan was for medical Rx. She has a mild ischemic cardiomyopathy with a left ventricular ejection fraction of 45% to 50% by echo 5/11.  We last saw her in Oct 2013. She presents now with multiple complaints. She says she has had Rt foot, leg, groin, and hip pain. She has chronic edema in that leg from her venectomy. She also complains of vague chest discomfort and a cough. She says she has seen her primary care about this and labs and XR have been done. We have requested those results.  She presents today for followup of peripheral vascular disease. I left her procedure 03/16/11 a recanalized a subtotally occluded left SFA with a Nitinol self-expanding stent. Recent Doppler was performed approximately 3 weeks ago suggested occlusion of that stent with an ABI of 0.5 range but she really denies claudication. She is a cardiology patient of Dr. Johnnette Gourd who she has not seen since October of 2013.    Current Outpatient Prescriptions  Medication Sig Dispense Refill  . albuterol (PROVENTIL HFA;VENTOLIN HFA) 108 (90 BASE) MCG/ACT inhaler Inhale 2 puffs into the lungs every 6 (six) hours as needed. For shortness of breath        . amLODipine (NORVASC) 10 MG tablet Take 10 mg by mouth daily.        [name] aspirin 81 MG tablet Take 81 mg by mouth daily.         . cloNIDine (CATAPRES) 0.2 MG tablet Take 0.2 mg by mouth daily.       . clopidogrel (PLAVIX) 75 MG tablet Take 75 mg by mouth daily.      . fluticasone (VERAMYST) 27.5 MCG/SPRAY nasal spray Place 2 sprays into the nose daily.        . furosemide (LASIX) 20 MG tablet Take 20 mg by mouth daily.       [name] gabapentin (NEURONTIN) 400 MG capsule Take 400 mg by mouth daily.        [name] glimepiride (AMARYL) 4 MG tablet Take 4 mg by mouth 2 (two) times daily.        . insulin aspart (NOVOLOG) 100 UNIT/ML injection Inject 60 Units into the skin 2 (two) times daily. Per sliding scale      . isosorbide dinitrate (ISORDIL) 20 MG tablet Take 20 mg by mouth daily.      [name] levothyroxine (SYNTHROID, LEVOTHROID) 25 MCG tablet Take 25 mcg by mouth daily before breakfast.      . metoprolol (TOPROL-XL) 200 MG 24 hr tablet Take 200 mg by mouth daily.        . nitroGLYCERIN (NITROSTAT) 0.4 MG SL tablet Place 0.4 mg under the tongue every 5 (five) minutes as needed. For chest        . traMADol (ULTRAM) 50 MG tablet Take 1 tablet (50 mg total) by mouth every 8 (eight) hours as needed. For pain/Maximum  dose= 8 tablets per day  30 tablet  3   No current facility-administered medications for this visit.    Allergies  Allergen Reactions  . Penicillins Cross Reactors Hives    And high fever  . Adhesive (Tape) Rash    bruising    History   Social History  . Marital Status: Divorced    Spouse Name: N/A    Number of Children: N/A  . Years of Education: N/A   Occupational History  . Not on file.   Social History Main Topics  . Smoking status: Former Smoker -- 1.00 packs/day for 25 years    Types: Cigarettes    Quit date: 04/04/2000  . Smokeless tobacco: Never Used  . Alcohol Use: Yes     Comment: occasional drinker (holidays)  . Drug Use: No  . Sexually Active: No   Other Topics Concern  . Not on file   Social History Narrative  . No narrative on file     Review of Systems: General: negative  for chills, fever, night sweats or weight changes.  Cardiovascular: negative for chest pain, dyspnea on exertion, edema, orthopnea, palpitations, paroxysmal nocturnal dyspnea or shortness of breath Dermatological: negative for rash Respiratory: negative for cough or wheezing Urologic: negative for hematuria Abdominal: negative for nausea, vomiting, diarrhea, bright red blood per rectum, melena, or hematemesis Neurologic: negative for visual changes, syncope, or dizziness All other systems reviewed and are otherwise negative except as noted above.    Blood pressure 144/86, pulse 68, height 5' 10.5" (1.791 m), weight 243 lb 6.4 oz (110.406 kg).  General appearance: alert and no distress Neck: no adenopathy, no carotid bruit, no JVD, supple, symmetrical, trachea midline and thyroid not enlarged, symmetric, no tenderness/mass/nodules Lungs: clear to auscultation bilaterally Heart: regular rate and rhythm, S1, S2 normal, no murmur, click, rub or gallop Extremities: extremities normal, atraumatic, no cyanosis or edema and 2+ right pedal pulse, absent left pedal pulse.  EKG not performed today  ASSESSMENT AND PLAN:   PVD, LSFA PTA 12/12 Status post peripheral angiography, PTCA and stenting of her left SFA 04/05/11 by myself using a Nitinol self-expanding stent. She did have moderate segmental mid right SFA disease. Followup Dopplers recently performed in our office 10/02/12 revealed a right ABI 0.85 with moderate SFA disease the left ABI of 0.56 with what appears to be an occluded left SFA stent. She really denies claudication. At this point we'll continue to follow her noninvasively. There is no indication for re\re intervention.      Lorretta Harp MD FACP,FACC,FAHA, [facility] 10/22/2012 3:40 PM

## 2012-10-26 DIAGNOSIS — K3189 Other diseases of stomach and duodenum: Secondary | ICD-10-CM | POA: Diagnosis not present

## 2012-10-26 DIAGNOSIS — E039 Hypothyroidism, unspecified: Secondary | ICD-10-CM | POA: Diagnosis not present

## 2012-10-26 DIAGNOSIS — I10 Essential (primary) hypertension: Secondary | ICD-10-CM | POA: Diagnosis not present

## 2012-10-26 DIAGNOSIS — E119 Type 2 diabetes mellitus without complications: Secondary | ICD-10-CM | POA: Diagnosis not present

## 2012-10-26 DIAGNOSIS — R609 Edema, unspecified: Secondary | ICD-10-CM | POA: Diagnosis not present

## 2012-10-26 DIAGNOSIS — M545 Low back pain: Secondary | ICD-10-CM | POA: Diagnosis not present

## 2012-10-26 DIAGNOSIS — F411 Generalized anxiety disorder: Secondary | ICD-10-CM | POA: Diagnosis not present

## 2012-10-26 DIAGNOSIS — E785 Hyperlipidemia, unspecified: Secondary | ICD-10-CM | POA: Diagnosis not present

## 2012-10-26 DIAGNOSIS — R1013 Epigastric pain: Secondary | ICD-10-CM | POA: Diagnosis not present

## 2012-11-12 DIAGNOSIS — D509 Iron deficiency anemia, unspecified: Secondary | ICD-10-CM | POA: Diagnosis not present

## 2012-11-12 DIAGNOSIS — N2581 Secondary hyperparathyroidism of renal origin: Secondary | ICD-10-CM | POA: Diagnosis not present

## 2012-11-12 DIAGNOSIS — E1129 Type 2 diabetes mellitus with other diabetic kidney complication: Secondary | ICD-10-CM | POA: Diagnosis not present

## 2012-11-12 DIAGNOSIS — I10 Essential (primary) hypertension: Secondary | ICD-10-CM | POA: Diagnosis not present

## 2012-11-13 ENCOUNTER — Ambulatory Visit (INDEPENDENT_AMBULATORY_CARE_PROVIDER_SITE_OTHER): Payer: Medicare Other | Admitting: Cardiovascular Disease

## 2012-11-13 ENCOUNTER — Encounter: Payer: Self-pay | Admitting: Cardiovascular Disease

## 2012-11-13 VITALS — BP 132/80 | HR 67 | Ht 70.5 in | Wt 246.5 lb

## 2012-11-13 DIAGNOSIS — Z01818 Encounter for other preprocedural examination: Secondary | ICD-10-CM | POA: Diagnosis not present

## 2012-11-13 DIAGNOSIS — R5381 Other malaise: Secondary | ICD-10-CM

## 2012-11-13 DIAGNOSIS — R5383 Other fatigue: Secondary | ICD-10-CM

## 2012-11-13 DIAGNOSIS — I739 Peripheral vascular disease, unspecified: Secondary | ICD-10-CM | POA: Diagnosis not present

## 2012-11-13 DIAGNOSIS — Z79899 Other long term (current) drug therapy: Secondary | ICD-10-CM | POA: Diagnosis not present

## 2012-11-13 DIAGNOSIS — I252 Old myocardial infarction: Secondary | ICD-10-CM

## 2012-11-13 DIAGNOSIS — D689 Coagulation defect, unspecified: Secondary | ICD-10-CM | POA: Diagnosis not present

## 2012-11-13 DIAGNOSIS — Z87891 Personal history of nicotine dependence: Secondary | ICD-10-CM

## 2012-11-13 NOTE — Progress Notes (Signed)
11/13/2012 Brooke Stafford   [birth year]  [id number]  Primary Physician Benito Mccreedy, MD Primary Cardiologist: Lorretta Harp MD Brooke Stafford   HPI:  The patient is a 57 year old woman with severe type 2 diabetes mellitus requiring insulin therapy, systemic hypertension, hyperlipidemia, obesity, PVD S/P LSFA PTA Dec 2012, coronary artery disease, S/P CABG x 6 in 2002. Cardiac catheterization was done in July 2011 when she presented with sepsis and medication non compliance and had an NSTEMI from demand ischemia. Cath then showed a patent LIMA to LAD, patent SVG to diagonal, and patent sequential SVG to branches of the right coronary artery. The plan was for medical Rx. She has a mild ischemic cardiomyopathy with a left ventricular ejection fraction of 45% to 50% by echo 5/11.   She was seen by Kerin Ransom [facility] on 10/22/12 with complaints of left lower externa claudication of the last several months similar to her preintervention symptoms. A followup Doppler performed 10/02/12 revealed a decrease in her left ABI 2.56 with an occluded left SFA.     Current Outpatient Prescriptions  Medication Sig Dispense Refill  . albuterol (PROVENTIL HFA;VENTOLIN HFA) 108 (90 BASE) MCG/ACT inhaler Inhale 2 puffs into the lungs every 6 (six) hours as needed. For shortness of breath        . amLODipine (NORVASC) 10 MG tablet Take 10 mg by mouth daily.        [name] aspirin 81 MG tablet Take 81 mg by mouth daily.        . cloNIDine (CATAPRES) 0.2 MG tablet Take 0.2 mg by mouth daily.       . clopidogrel (PLAVIX) 75 MG tablet Take 75 mg by mouth daily.      . fluticasone (VERAMYST) 27.5 MCG/SPRAY nasal spray Place 2 sprays into the nose daily.        . furosemide (LASIX) 20 MG tablet Take 20 mg by mouth daily.       [name] gabapentin (NEURONTIN) 400 MG capsule Take 400 mg by mouth daily.        [name] glimepiride (AMARYL) 4 MG tablet Take 4 mg by mouth 2 (two) times daily.        . insulin aspart (NOVOLOG) 100  UNIT/ML injection Inject 70 Units into the skin 2 (two) times daily. Per sliding scale      . isosorbide dinitrate (ISORDIL) 20 MG tablet Take 20 mg by mouth daily.      [name] levothyroxine (SYNTHROID, LEVOTHROID) 25 MCG tablet Take 25 mcg by mouth daily before breakfast.      . metoprolol (TOPROL-XL) 200 MG 24 hr tablet Take 200 mg by mouth daily.        . nitroGLYCERIN (NITROSTAT) 0.4 MG SL tablet Place 0.4 mg under the tongue every 5 (five) minutes as needed. For chest        . traMADol (ULTRAM) 50 MG tablet Take 1 tablet (50 mg total) by mouth every 8 (eight) hours as needed. For pain/Maximum dose= 8 tablets per day  30 tablet  3   No current facility-administered medications for this visit.    Allergies  Allergen Reactions  . Penicillins Cross Reactors Hives    And high fever  . Adhesive (Tape) Rash    bruising    History   Social History  . Marital Status: Divorced    Spouse Name: N/A    Number of Children: N/A  . Years of Education: N/A   Occupational History  . Not on file.  Social History Main Topics  . Smoking status: Former Smoker -- 1.00 packs/day for 25 years    Types: Cigarettes    Quit date: 04/04/2000  . Smokeless tobacco: Never Used  . Alcohol Use: Yes     Comment: occasional drinker (holidays)  . Drug Use: No  . Sexually Active: No   Other Topics Concern  . Not on file   Social History Narrative  . No narrative on file     Review of Systems: General: negative for chills, fever, night sweats or weight changes.  Cardiovascular: negative for chest pain, dyspnea on exertion, edema, orthopnea, palpitations, paroxysmal nocturnal dyspnea or shortness of breath Dermatological: negative for rash Respiratory: negative for cough or wheezing Urologic: negative for hematuria Abdominal: negative for nausea, vomiting, diarrhea, bright red blood per rectum, melena, or hematemesis Neurologic: negative for visual changes, syncope, or dizziness All other systems  reviewed and are otherwise negative except as noted above.    Blood pressure 132/80, pulse 67, height 5' 10.5" (1.791 m), weight 246 lb 8 oz (111.812 kg).  General appearance: alert and no distress Neck: no adenopathy, no carotid bruit, no JVD, supple, symmetrical, trachea midline and thyroid not enlarged, symmetric, no tenderness/mass/nodules Lungs: clear to auscultation bilaterally Heart: regular rate and rhythm, S1, S2 normal, no murmur, click, rub or gallop Extremities: extremities normal, atraumatic, no cyanosis or edema and 1+ right pedal pulse, absent left pedal pulses  EKG normal sinus rhythm at 57 with nonspecific ST and T-wave changes  ASSESSMENT AND PLAN:   PVD, LSFA PTA 12/12 Patient underwent percutaneous transposition of a subtotally occluded left SFA with nitinol self-expanding stents (6 mL by 150 mm). She had an excellent angiographic result. She was recently seen by Kerin Ransom [facility] with recurrent symptoms and Dopplers performed in our office which showed a right ABI of 0.85 the left of 0.56. Her left SFA has occluded. I plan to go back in and recanalize her left SFA with [place] plasty posterior minus atherectomy plus minus via Bahn covered stenting.      Lorretta Harp MD FACP,FACC,FAHA, [facility] 11/13/2012 4:37 PM

## 2012-11-13 NOTE — Assessment & Plan Note (Signed)
Patient underwent percutaneous transposition of a subtotally occluded left SFA with nitinol self-expanding stents (6 mL by 150 mm). She had an excellent angiographic result. She was recently seen by Kerin Ransom [facility] with recurrent symptoms and Dopplers performed in our office which showed a right ABI of 0.85 the left of 0.56. Her left SFA has occluded. I plan to go back in and recanalize her left SFA with [place] plasty posterior minus atherectomy plus minus via Bahn covered stenting.

## 2012-11-13 NOTE — Patient Instructions (Addendum)
Dr. Gwenlyn Found has ordered a peripheral angiogram to be done at [facility].  This procedure is going to look at the bloodflow in your lower extremities.  If Dr. Gwenlyn Found is able to open up the arteries, you will have to spend one night in the hospital.  If he is not able to open the arteries, you will be able to go home that same day.    After the procedure, you will not be allowed to drive for 3 days or push, pull, or lift anything greater than 10 lbs for one week.    You will be required to have bloodwork and a chest xray prior to your procedure.  Our scheduler will advise you on when these items need to be done.       Reps: Event organiser and [employer] rep Research officer, political party)

## 2012-11-19 ENCOUNTER — Encounter (HOSPITAL_COMMUNITY): Payer: Self-pay | Admitting: Pharmacy Technician

## 2012-11-25 DIAGNOSIS — R5383 Other fatigue: Secondary | ICD-10-CM | POA: Diagnosis not present

## 2012-11-25 DIAGNOSIS — R5381 Other malaise: Secondary | ICD-10-CM | POA: Diagnosis not present

## 2012-11-25 DIAGNOSIS — Z79899 Other long term (current) drug therapy: Secondary | ICD-10-CM | POA: Diagnosis not present

## 2012-11-25 DIAGNOSIS — D689 Coagulation defect, unspecified: Secondary | ICD-10-CM | POA: Diagnosis not present

## 2012-11-25 LAB — BASIC METABOLIC PANEL
CO2: 28 mEq/L (ref 19–32)
Chloride: 103 mEq/L (ref 96–112)
Glucose, Bld: 232 mg/dL — ABNORMAL HIGH (ref 70–99)
Potassium: 4.6 mEq/L (ref 3.5–5.3)
Sodium: 137 mEq/L (ref 135–145)

## 2012-11-25 LAB — CBC
HCT: 38.2 % (ref 36.0–46.0)
MCH: 31.8 pg (ref 26.0–34.0)
MCV: 91.4 fL (ref 78.0–100.0)
RDW: 13.5 % (ref 11.5–15.5)
WBC: 5.3 10*3/uL (ref 4.0–10.5)

## 2012-11-25 LAB — PROTIME-INR: INR: 0.95 (ref <1.50)

## 2012-11-30 ENCOUNTER — Telehealth: Payer: Self-pay | Admitting: Cardiovascular Disease

## 2012-11-30 ENCOUNTER — Encounter (HOSPITAL_COMMUNITY): Payer: Self-pay | Admitting: General Practice

## 2012-11-30 ENCOUNTER — Observation Stay (HOSPITAL_COMMUNITY)
Admission: RE | Admit: 2012-11-30 | Discharge: 2012-12-02 | Disposition: A | Discharge status: Home / Self Care | Payer: Medicare Other | Source: Ambulatory Visit | Attending: Cardiovascular Disease | Admitting: Cardiovascular Disease

## 2012-11-30 DIAGNOSIS — I70219 Atherosclerosis of native arteries of extremities with intermittent claudication, unspecified extremity: Secondary | ICD-10-CM | POA: Diagnosis not present

## 2012-11-30 DIAGNOSIS — E119 Type 2 diabetes mellitus without complications: Secondary | ICD-10-CM | POA: Diagnosis not present

## 2012-11-30 DIAGNOSIS — I10 Essential (primary) hypertension: Secondary | ICD-10-CM | POA: Diagnosis present

## 2012-11-30 DIAGNOSIS — I2589 Other forms of chronic ischemic heart disease: Secondary | ICD-10-CM | POA: Diagnosis not present

## 2012-11-30 DIAGNOSIS — N189 Chronic kidney disease, unspecified: Secondary | ICD-10-CM | POA: Insufficient documentation

## 2012-11-30 DIAGNOSIS — Z01818 Encounter for other preprocedural examination: Secondary | ICD-10-CM

## 2012-11-30 DIAGNOSIS — I7092 Chronic total occlusion of artery of the extremities: Secondary | ICD-10-CM | POA: Diagnosis not present

## 2012-11-30 DIAGNOSIS — E785 Hyperlipidemia, unspecified: Secondary | ICD-10-CM | POA: Insufficient documentation

## 2012-11-30 DIAGNOSIS — Z9862 Peripheral vascular angioplasty status: Secondary | ICD-10-CM

## 2012-11-30 DIAGNOSIS — Z9119 Patient's noncompliance with other medical treatment and regimen: Secondary | ICD-10-CM | POA: Diagnosis not present

## 2012-11-30 DIAGNOSIS — I252 Old myocardial infarction: Secondary | ICD-10-CM | POA: Insufficient documentation

## 2012-11-30 DIAGNOSIS — I129 Hypertensive chronic kidney disease with stage 1 through stage 4 chronic kidney disease, or unspecified chronic kidney disease: Secondary | ICD-10-CM | POA: Insufficient documentation

## 2012-11-30 DIAGNOSIS — I739 Peripheral vascular disease, unspecified: Secondary | ICD-10-CM | POA: Diagnosis present

## 2012-11-30 DIAGNOSIS — Z794 Long term (current) use of insulin: Secondary | ICD-10-CM | POA: Diagnosis not present

## 2012-11-30 DIAGNOSIS — Z951 Presence of aortocoronary bypass graft: Secondary | ICD-10-CM | POA: Diagnosis not present

## 2012-11-30 DIAGNOSIS — E1122 Type 2 diabetes mellitus with diabetic chronic kidney disease: Secondary | ICD-10-CM

## 2012-11-30 DIAGNOSIS — Y831 Surgical operation with implant of artificial internal device as the cause of abnormal reaction of the patient, or of later complication, without mention of misadventure at the time of the procedure: Secondary | ICD-10-CM | POA: Insufficient documentation

## 2012-11-30 DIAGNOSIS — Z91199 Patient's noncompliance with other medical treatment and regimen due to unspecified reason: Secondary | ICD-10-CM | POA: Insufficient documentation

## 2012-11-30 HISTORY — DX: Migraine, unspecified, not intractable, without status migrainosus: G43.909

## 2012-11-30 HISTORY — DX: Type 2 diabetes mellitus without complications: E11.9

## 2012-11-30 HISTORY — DX: Unspecified osteoarthritis, unspecified site: M19.90

## 2012-11-30 HISTORY — DX: Unspecified chronic bronchitis: J42

## 2012-11-30 LAB — GLUCOSE, CAPILLARY
Glucose-Capillary: 462 mg/dL — ABNORMAL HIGH (ref 70–99)
Glucose-Capillary: 215 mg/dL — ABNORMAL HIGH (ref 70–99)

## 2012-11-30 MED ORDER — METOPROLOL SUCCINATE ER 100 MG PO TB24
100.0000 mg | ORAL_TABLET | Freq: Every day | ORAL | Status: DC
Start: 2012-11-30 — End: 2012-12-02
  Administered 2012-11-30 – 2012-12-02 (×3): 100 mg via ORAL
  Filled 2012-11-30 (×3): qty 1

## 2012-11-30 MED ORDER — ISOSORBIDE DINITRATE 20 MG PO TABS
20.0000 mg | ORAL_TABLET | Freq: Every day | ORAL | Status: DC
  Administered 2012-12-01 – 2012-12-02 (×2): 20 mg via ORAL
  Filled 2012-11-30 (×2): qty 1

## 2012-11-30 MED ORDER — INSULIN ASPART 100 UNIT/ML ~~LOC~~ SOLN
0.0000 [IU] | Freq: Three times a day (TID) | SUBCUTANEOUS | Status: DC
  Administered 2012-12-01: 7 [IU] via SUBCUTANEOUS

## 2012-11-30 MED ORDER — DIAZEPAM 5 MG PO TABS
5.0000 mg | ORAL_TABLET | ORAL | Status: AC
  Administered 2012-12-01: 5 mg via ORAL
  Filled 2012-11-30: qty 1

## 2012-11-30 MED ORDER — ALBUTEROL SULFATE HFA 108 (90 BASE) MCG/ACT IN AERS
2.0000 | INHALATION_SPRAY | Freq: Four times a day (QID) | RESPIRATORY_TRACT | Status: DC | PRN
  Filled 2012-11-30: qty 6.7

## 2012-11-30 MED ORDER — INSULIN ASPART 100 UNIT/ML ~~LOC~~ SOLN
20.0000 [IU] | Freq: Once | SUBCUTANEOUS | Status: AC
  Administered 2012-11-30: 20 [IU] via SUBCUTANEOUS

## 2012-11-30 MED ORDER — GABAPENTIN 400 MG PO CAPS
400.0000 mg | ORAL_CAPSULE | Freq: Every day | ORAL | Status: DC
  Administered 2012-11-30 – 2012-12-02 (×3): 400 mg via ORAL
  Filled 2012-11-30 (×3): qty 1

## 2012-11-30 MED ORDER — CLONAZEPAM 0.5 MG PO TABS
1.0000 mg | ORAL_TABLET | Freq: Two times a day (BID) | ORAL | Status: DC | PRN
  Administered 2012-12-01: 1 mg via ORAL
  Filled 2012-11-30: qty 2

## 2012-11-30 MED ORDER — ASPIRIN 81 MG PO CHEW
324.0000 mg | CHEWABLE_TABLET | ORAL | Status: AC
  Administered 2012-12-01: 324 mg via ORAL
  Filled 2012-11-30: qty 4

## 2012-11-30 MED ORDER — ASPIRIN 81 MG PO CHEW
81.0000 mg | CHEWABLE_TABLET | Freq: Every day | ORAL | Status: DC
  Administered 2012-12-02: 81 mg via ORAL
  Filled 2012-11-30 (×2): qty 1

## 2012-11-30 MED ORDER — CLOPIDOGREL BISULFATE 75 MG PO TABS
75.0000 mg | ORAL_TABLET | Freq: Every day | ORAL | Status: DC
  Administered 2012-11-30 – 2012-12-02 (×2): 75 mg via ORAL
  Filled 2012-11-30 (×3): qty 1

## 2012-11-30 MED ORDER — SODIUM CHLORIDE 0.9 % IJ SOLN: 3.0000 mL | INTRAMUSCULAR | Status: DC | PRN

## 2012-11-30 MED ORDER — LEVOTHYROXINE SODIUM 25 MCG PO TABS
25.0000 ug | ORAL_TABLET | Freq: Every day | ORAL | Status: DC
  Administered 2012-12-01 (×2): 25 ug via ORAL
  Filled 2012-11-30 (×3): qty 1

## 2012-11-30 MED ORDER — CLONIDINE HCL 0.2 MG PO TABS
0.2000 mg | ORAL_TABLET | Freq: Every day | ORAL | Status: DC
  Administered 2012-11-30 – 2012-12-02 (×3): 0.2 mg via ORAL
  Filled 2012-11-30 (×3): qty 1

## 2012-11-30 MED ORDER — FUROSEMIDE 20 MG PO TABS
20.0000 mg | ORAL_TABLET | Freq: Every day | ORAL | Status: DC
  Administered 2012-12-02: 09:00:00 20 mg via ORAL
  Filled 2012-11-30 (×2): qty 1

## 2012-11-30 MED ORDER — SODIUM CHLORIDE 0.9 % IV SOLN
INTRAVENOUS | Status: DC
  Administered 2012-11-30: 21:00:00 via INTRAVENOUS

## 2012-11-30 MED ORDER — AMLODIPINE BESYLATE 10 MG PO TABS
10.0000 mg | ORAL_TABLET | Freq: Every day | ORAL | Status: DC
  Administered 2012-12-01 – 2012-12-02 (×2): 10 mg via ORAL
  Filled 2012-11-30 (×2): qty 1

## 2012-11-30 MED ORDER — NITROGLYCERIN 0.4 MG SL SUBL: 0.4000 mg | SUBLINGUAL_TABLET | SUBLINGUAL | Status: DC | PRN

## 2012-11-30 NOTE — Progress Notes (Signed)
PT WAS DIRECT ADMIT, BRYAN PA. BS ELEVATED AT 462 PA AWARE, ORDERS GIVEN. RECHECKED BS WAS DOWN TO 444 PT EATING SUPPER. WILL RECHECK AT 2100. PT WITHOUT COMPLICATIONS.

## 2012-11-30 NOTE — Telephone Encounter (Signed)
Patient states that she is returning Kathryn's call

## 2012-11-30 NOTE — Telephone Encounter (Signed)
Message forwarded to K. Vogel, RN.  

## 2012-11-30 NOTE — Progress Notes (Signed)
    Subjective: Did not take insulin this morning because she did not feel good.  According to her husband she is noncompliant with diet.  She ate a entire box on cookies between last night and today.    Objective: Vital signs in last 24 hours:      Intake/Output from previous day:   Intake/Output this shift:    Medications Current Facility-Administered Medications  Medication Dose Route Frequency Provider Last Rate Last Dose  . [START ON 12/01/2012] 0.9 %  sodium chloride infusion   Intravenous Continuous Lorretta Harp, MD      . albuterol (PROVENTIL HFA;VENTOLIN HFA) 108 (90 BASE) MCG/ACT inhaler 2 puff  2 puff Inhalation Q6H PRN Tarri Fuller, PA-C      . [START ON 12/01/2012] amLODipine (NORVASC) tablet 10 mg  10 mg Oral Daily Tarri Fuller, PA-C      . [START ON 12/01/2012] aspirin chewable tablet 324 mg  324 mg Oral Pre-Cath Lorretta Harp, MD      . aspirin tablet 81 mg  81 mg Oral Daily Tarri Fuller, PA-C      . clonazePAM (KLONOPIN) tablet 1 mg  1 mg Oral BID PRN Tarri Fuller, PA-C      . cloNIDine (CATAPRES) tablet 0.2 mg  0.2 mg Oral Daily Tarri Fuller, PA-C      . clopidogrel (PLAVIX) tablet 75 mg  75 mg Oral Daily Tarri Fuller, PA-C      . diazepam (VALIUM) tablet 5 mg  5 mg Oral On Call Lorretta Harp, MD      . Derrill Memo ON 12/01/2012] furosemide (LASIX) tablet 20 mg  20 mg Oral Daily Tarri Fuller, PA-C      . gabapentin (NEURONTIN) capsule 400 mg  400 mg Oral Daily Tarri Fuller, PA-C      . [START ON 12/01/2012] insulin aspart (novoLOG) injection 0-20 Units  0-20 Units Subcutaneous TID WC Tarri Fuller, PA-C      . [START ON 12/01/2012] isosorbide dinitrate (ISORDIL) tablet 20 mg  20 mg Oral Daily Tarri Fuller, PA-C      . [START ON 12/01/2012] levothyroxine (SYNTHROID, LEVOTHROID) tablet 25 mcg  25 mcg Oral QAC breakfast Tarri Fuller, PA-C      . metoprolol succinate (TOPROL-XL) 24 hr tablet 100 mg  100 mg Oral Daily Tarri Fuller, PA-C      . nitroGLYCERIN (NITROSTAT) SL tablet 0.4 mg   0.4 mg Sublingual Q5 min PRN Tarri Fuller, PA-C      . sodium chloride 0.9 % injection 3 mL  3 mL Intravenous PRN Lorretta Harp, MD        PE: General appearance: alert, cooperative and no distress Lungs: clear to auscultation bilaterally Heart: regular rate and rhythm, S1, S2 normal, no murmur, click, rub or gallop Extremities: 1+ right [name] Pulses: 2+ and symmetric 1+ right PT, 1+ left DP Skin: Warm and dry Neurologic: Grossly normal    Assessment/Plan    Principal Problem:   PVD, LSFA PTA 12/12 Active Problems:   HTN (hypertension), malignant, patent renal arteries   Diabetes mellitus with chronic kidney disease  Plan:  PV angiogram tomorrow.  Glu is 462.  Will give 20 units Novolog now and recheck CBG in one hour.  NPO after MDN.   LOS: 0 days    Brooke Stafford 11/30/2012 5:20 PM

## 2012-11-30 NOTE — Telephone Encounter (Signed)
I spoke with patient and notified her of her labs and the need to be admitted today for hydration.  Pt agreeable.

## 2012-12-01 ENCOUNTER — Other Ambulatory Visit: Payer: Self-pay | Admitting: Cardiology

## 2012-12-01 ENCOUNTER — Encounter (HOSPITAL_COMMUNITY)
Admission: RE | Disposition: A | Discharge status: Home / Self Care | Payer: Self-pay | Source: Ambulatory Visit | Attending: Cardiovascular Disease

## 2012-12-01 DIAGNOSIS — E1129 Type 2 diabetes mellitus with other diabetic kidney complication: Secondary | ICD-10-CM | POA: Diagnosis not present

## 2012-12-01 DIAGNOSIS — I10 Essential (primary) hypertension: Secondary | ICD-10-CM

## 2012-12-01 DIAGNOSIS — I7092 Chronic total occlusion of artery of the extremities: Secondary | ICD-10-CM | POA: Diagnosis not present

## 2012-12-01 DIAGNOSIS — N189 Chronic kidney disease, unspecified: Secondary | ICD-10-CM

## 2012-12-01 DIAGNOSIS — I2589 Other forms of chronic ischemic heart disease: Secondary | ICD-10-CM | POA: Diagnosis not present

## 2012-12-01 DIAGNOSIS — E119 Type 2 diabetes mellitus without complications: Secondary | ICD-10-CM | POA: Diagnosis not present

## 2012-12-01 DIAGNOSIS — I739 Peripheral vascular disease, unspecified: Secondary | ICD-10-CM

## 2012-12-01 DIAGNOSIS — I252 Old myocardial infarction: Secondary | ICD-10-CM | POA: Diagnosis not present

## 2012-12-01 DIAGNOSIS — E785 Hyperlipidemia, unspecified: Secondary | ICD-10-CM | POA: Diagnosis not present

## 2012-12-01 DIAGNOSIS — I70219 Atherosclerosis of native arteries of extremities with intermittent claudication, unspecified extremity: Secondary | ICD-10-CM | POA: Diagnosis not present

## 2012-12-01 HISTORY — PX: LOWER EXTREMITY ANGIOGRAM: SHX5508

## 2012-12-01 HISTORY — PX: PERCUTANEOUS STENT INTERVENTION: SHX5500

## 2012-12-01 LAB — POCT ACTIVATED CLOTTING TIME
Activated Clotting Time: 222 seconds
Activated Clotting Time: 237 seconds
Activated Clotting Time: 217 seconds
Activated Clotting Time: 114 seconds

## 2012-12-01 LAB — GLUCOSE, CAPILLARY
Glucose-Capillary: 211 mg/dL — ABNORMAL HIGH (ref 70–99)
Glucose-Capillary: 196 mg/dL — ABNORMAL HIGH (ref 70–99)

## 2012-12-01 LAB — BASIC METABOLIC PANEL
BUN: 17 mg/dL (ref 6–23)
Chloride: 103 mEq/L (ref 96–112)
GFR calc Af Amer: 49 mL/min — ABNORMAL LOW (ref >90)
GFR calc non Af Amer: 42 mL/min — ABNORMAL LOW (ref >90)
Potassium: 3.7 mEq/L (ref 3.5–5.1)

## 2012-12-01 SURGERY — ANGIOGRAM, LOWER EXTREMITY

## 2012-12-01 MED ORDER — LABETALOL HCL 5 MG/ML IV SOLN
20.0000 mg | Freq: Once | INTRAVENOUS | Status: AC
  Administered 2012-12-01: 20 mg via INTRAVENOUS
  Filled 2012-12-01: qty 4

## 2012-12-01 MED ORDER — LIDOCAINE HCL (PF) 1 % IJ SOLN
INTRAMUSCULAR | Status: AC
  Filled 2012-12-01: qty 30

## 2012-12-01 MED ORDER — HYDRALAZINE HCL 20 MG/ML IJ SOLN
INTRAMUSCULAR | Status: AC
  Filled 2012-12-01: qty 1

## 2012-12-01 MED ORDER — CLOPIDOGREL BISULFATE 300 MG PO TABS
ORAL_TABLET | ORAL | Status: AC
  Filled 2012-12-01: qty 1

## 2012-12-01 MED ORDER — HEPARIN (PORCINE) IN NACL 2-0.9 UNIT/ML-% IJ SOLN
INTRAMUSCULAR | Status: AC
  Filled 2012-12-01: qty 500

## 2012-12-01 MED ORDER — FENTANYL CITRATE 0.05 MG/ML IJ SOLN
INTRAMUSCULAR | Status: AC
  Filled 2012-12-01: qty 2

## 2012-12-01 MED ORDER — ACETAMINOPHEN 325 MG PO TABS
650.0000 mg | ORAL_TABLET | ORAL | Status: DC | PRN
  Filled 2012-12-01: qty 2

## 2012-12-01 MED ORDER — HYDRALAZINE HCL 20 MG/ML IJ SOLN
10.0000 mg | INTRAMUSCULAR | Status: DC | PRN
  Administered 2012-12-01: 16:00:00 10 mg via INTRAVENOUS
  Filled 2012-12-01 (×4): qty 1

## 2012-12-01 MED ORDER — MIDAZOLAM HCL 2 MG/2ML IJ SOLN
INTRAMUSCULAR | Status: AC
  Filled 2012-12-01: qty 2

## 2012-12-01 MED ORDER — INSULIN ASPART 100 UNIT/ML ~~LOC~~ SOLN
0.0000 [IU] | Freq: Three times a day (TID) | SUBCUTANEOUS | Status: DC
  Administered 2012-12-02: 09:00:00 15 [IU] via SUBCUTANEOUS

## 2012-12-01 MED ORDER — ONDANSETRON HCL 4 MG/2ML IJ SOLN: 4.0000 mg | Freq: Four times a day (QID) | INTRAMUSCULAR | Status: DC | PRN

## 2012-12-01 MED ORDER — HEPARIN (PORCINE) IN NACL 2-0.9 UNIT/ML-% IJ SOLN
INTRAMUSCULAR | Status: AC
  Filled 2012-12-01: qty 1000

## 2012-12-01 MED ORDER — HEPARIN SODIUM (PORCINE) 1000 UNIT/ML IJ SOLN
INTRAMUSCULAR | Status: AC
  Filled 2012-12-01: qty 1

## 2012-12-01 MED ORDER — HYDRALAZINE HCL 20 MG/ML IJ SOLN
10.0000 mg | INTRAMUSCULAR | Status: DC
  Administered 2012-12-01 (×2): 10 mg via INTRAVENOUS

## 2012-12-01 MED ORDER — NITROGLYCERIN IN D5W 200-5 MCG/ML-% IV SOLN
INTRAVENOUS | Status: AC
  Filled 2012-12-01: qty 250

## 2012-12-01 MED ORDER — MORPHINE SULFATE 2 MG/ML IJ SOLN
2.0000 mg | INTRAMUSCULAR | Status: DC | PRN
  Administered 2012-12-01 (×2): 2 mg via INTRAVENOUS
  Filled 2012-12-01 (×2): qty 1

## 2012-12-01 MED ORDER — NITROGLYCERIN IN D5W 200-5 MCG/ML-% IV SOLN
2.0000 ug/min | INTRAVENOUS | Status: DC
  Administered 2012-12-01: 20:00:00 10 ug/min via INTRAVENOUS

## 2012-12-01 MED ORDER — INSULIN ASPART 100 UNIT/ML ~~LOC~~ SOLN
0.0000 [IU] | Freq: Every day | SUBCUTANEOUS | Status: DC
  Administered 2012-12-01: 4 [IU] via SUBCUTANEOUS

## 2012-12-01 MED ORDER — LIVING WELL WITH DIABETES BOOK
Freq: Once | Status: DC
  Filled 2012-12-01: qty 1

## 2012-12-01 MED ORDER — SODIUM CHLORIDE 0.9 % IV SOLN: INTRAVENOUS | Status: AC

## 2012-12-01 NOTE — Progress Notes (Signed)
Inpatient Diabetes Program Recommendations  AACE/ADA: New Consensus Statement on Inpatient Glycemic Control (2013)  Target Ranges:  Prepandial:   less than 140 mg/dL      Peak postprandial:   less than 180 mg/dL (1-2 hours)      Critically ill patients:  140 - 180 mg/dL   Reason for Visit:  Results for RACHALE, ANGELUCCI (MRN [id number]) as of 12/01/2012 10:51  Ref. Range 11/30/2012 16:56 11/30/2012 18:01 11/30/2012 21:17 12/01/2012 05:57  Glucose-Capillary Latest Range: 70-99 mg/dL 462 (H) 445 (H) 215 (H) 211 (H)  A1C=8.0%.   It appears that patient did not take her insulin yesterday morning which likely contributed to elevated CBG's.  Patient is currently in procedure.  Consider restarting 1/2 of home dose of 70/30 once diet restarted.  Consider Novolog 70/30 35 units bid and decrease correction to moderate tid with meals and HS scale.    Will follow and attempt to speak to patient on 12/02/12.

## 2012-12-01 NOTE — H&P (Signed)
   Pt was reexamined and existing H & P reviewed. No changes found.  Lorretta Harp, MD [facility] 12/01/2012 9:23 AM

## 2012-12-01 NOTE — Care Management Note (Unsigned)
    Page 1 of 1   12/01/2012     4:00:43 PM   CARE MANAGEMENT NOTE 12/01/2012  Patient:  Brooke Stafford, Brooke Stafford   Account Number:  [id number]  Date Initiated:  12/01/2012  Documentation initiated by:  Kamiryn Bezanson  Subjective/Objective Assessment:   PT ADM ON 11/30/12 WITH CHEST PAIN.  PTA, PT INDEPENDENT, LIVES WITH HUSBAND.     Action/Plan:   WILL FOLLOW FOR HOME NEEDS AS PT PROGRESSES.   Anticipated DC Date:  12/02/2012   Anticipated DC Plan:  [place]  CM consult      Choice offered to / List presented to:             Status of service:  In process, will continue to follow Medicare Important Message given?   (If response is "NO", the following Medicare IM given date fields will be blank) Date Medicare IM given:   Date Additional Medicare IM given:    Discharge Disposition:    Per UR Regulation:  Reviewed for med. necessity/level of care/duration of stay  If discussed at [place] of Stay Meetings, dates discussed:    Comments:

## 2012-12-01 NOTE — CV Procedure (Signed)
Brooke Stafford is a 57 y.o. female    TQ:[id number] LOCATION:  FACILITY: [facility]  PHYSICIAN: Quay Burow, M.D. [birth year]   DATE OF PROCEDURE:  12/01/2012  DATE OF DISCHARGE:   CARDIAC CATHETERIZATION     History obtained from chart review.The patient is a 57 year old woman with severe type 2 diabetes mellitus requiring insulin therapy, systemic hypertension, hyperlipidemia, obesity, PVD S/P LSFA PTA Dec 2012, coronary artery disease, S/P CABG x 6 in 2002. Cardiac catheterization was done in July 2011 when she presented with sepsis and medication non compliance and had an NSTEMI from demand ischemia. Cath then showed a patent LIMA to LAD, patent SVG to diagonal, and patent sequential SVG to branches of the right coronary artery. The plan was for medical Rx. She has a mild ischemic cardiomyopathy with a left ventricular ejection fraction of 45% to 50% by echo 5/11.  She was seen by Brooke Stafford on 10/22/12 with complaints of left lower externa claudication of the last several months similar to her preintervention symptoms. A followup Doppler performed 10/02/12 revealed a decrease in her left ABI 2.56 with an occluded left SFA.    PROCEDURE DESCRIPTION:    The patient was brought to the second floor [facility] cath lab in the postabsorptive state. She was premedicated with Valium 5 mg by mouth, IV Versed and fentanyl. Her right groinwas prepped and shaved in usual sterile fashion. Xylocaine 1% was used for local anesthesia. A 5 French sheath was inserted into the right common femoral  artery using standard Seldinger technique.a 5 French pigtail catheter was used for distal abdominal aortography and bilateral iliac artery angiography. A 5 French crossover catheter and then hole was used to obtain contralateral access with catheter placement in the left external iliac artery. Left lower extremity angiography with runoff was performed using bolus chase the digital subtraction settable  technique. Right lower extremity angiography runoff was used via the injection through the SideArm sheath. Visipaque dye was used for the entirety of the case. Retrograde aortic pressure was monitored during the case.   HEMODYNAMICS:    AO SYSTOLIC/AO DIASTOLIC: [id number]   ANGIOGRAPHIC RESULTS:   1: Distal abdominal aortogram-the distal aorta was widely patent without atherosclerotic or aneurysmal changes  2: Left lower extremity-the left SFA was occluded from just past the origin with occlusion of the previously placed SFA stent reconstitution in the distal popliteal via profunda femoris collaterals. There was three-vessel runoff  3: Right lower extremity-there was a 75% focal proximal, 50% segmental mid and 60% focal distal right SFA stenosis with one vessel runoff via the peroneal. Posterior tibials occluded. The anterior tibial had a long 99% segmental proximal stenosis  IMPRESSION:Brooke Stafford has an occluded left SFA secondary to "in-stent restenosis". I'm going to proceed with attempt at percutaneous revascularization of the chronic total occlusion using a Viance crossing device and a Viabahn covered stent.   Procedure description: The patient received a total of 8000 units of heparin intravenously with an ending ACT of 217. A total of 183 cc of contrast was administered to the patient. Using a Viance CTO catheter along with an 014/300 cm length [place] core wire the CTO  was crossed with moderate difficulty. Following this balloon angioplasty was performed with a 5 mm x 120 mm long balloon throughout the entirety of the stented segment as well as the proximal portion of the CTO. A Viabahn  5 mm x 250 mm long  Covered stent was then carefully positioned and deployed beyond the  previously stented segment up to the origin of the SFA and deployed successfully. It was then post dilated with a 5 mm x 120 mm long balloon at nominal pressures resulting in reduction of a total occlusion to 0% residual with  three-vessel runoff and excellent flow. There was a 50-60% stenosis in the above-the-knee popliteal artery.  Final impression: Successful crossing of a CTO  in the left SFA from in-stent restenosis" of a previously placed Nitinol self-expanding stent 03/2011 with a Viance Crossing  catheter and a Viabahn  covered stent. The patient received 300 mg of additional Plavix. The sheath was withdrawn across the bifurcation and exchanged over an 035 wire for a short 7 [place] sheath.  The patient left the lab in stable condition. The sheath will be removed once the ACT falls below 170 and pressure will be held on the groin to achieve hemostasis. The patient will be hydrated overnight and discharged home in the morning on aspirin Plavix. She'll get outpatient arterial Doppler studies in our office and followup with me thereafter.  Brooke Harp MD, [facility] 12/01/2012 11:24 AM

## 2012-12-01 NOTE — Progress Notes (Signed)
[facility]  Subjective: No complaints.   Objective: Vital signs in last 24 hours: Temp:  [98.4 F (36.9 C)-99.1 F (37.3 C)] 98.4 F (36.9 C) (08/19 0554) Pulse Rate:  [76-93] 76 (08/19 0554) Resp:  [18] 18 (08/19 0554) BP: (132-165)/(73-85) 152/85 mmHg (08/19 0554) SpO2:  [97 %-99 %] 98 % (08/19 0554) Weight:  [246 lb 11.1 oz (111.9 kg)] 246 lb 11.1 oz (111.9 kg) (08/18 1900) Last BM Date: 11/30/12  Intake/Output from previous day:   Intake/Output this shift:    Medications Current Facility-Administered Medications  Medication Dose Route Frequency Provider Last Rate Last Dose  . 0.9 %  sodium chloride infusion   Intravenous Continuous Lorretta Harp, MD 75 mL/hr at 11/30/12 2122    . albuterol (PROVENTIL HFA;VENTOLIN HFA) 108 (90 BASE) MCG/ACT inhaler 2 puff  2 puff Inhalation Q6H PRN Tarri Fuller, PA-C      . amLODipine (NORVASC) tablet 10 mg  10 mg Oral Daily Tarri Fuller, PA-C      . aspirin chewable tablet 81 mg  81 mg Oral Daily Tarri Fuller, PA-C      . clonazePAM (KLONOPIN) tablet 1 mg  1 mg Oral BID PRN Tarri Fuller, PA-C      . cloNIDine (CATAPRES) tablet 0.2 mg  0.2 mg Oral Daily Tarri Fuller, PA-C   0.2 mg at 11/30/12 1808  . clopidogrel (PLAVIX) tablet 75 mg  75 mg Oral Daily Tarri Fuller, PA-C   75 mg at 11/30/12 1807  . diazepam (VALIUM) tablet 5 mg  5 mg Oral On Call Lorretta Harp, MD      . furosemide (LASIX) tablet 20 mg  20 mg Oral Daily Tarri Fuller, PA-C      . gabapentin (NEURONTIN) capsule 400 mg  400 mg Oral Daily Tarri Fuller, PA-C   400 mg at 11/30/12 1807  . insulin aspart (novoLOG) injection 0-20 Units  0-20 Units Subcutaneous TID WC Tarri Fuller, PA-C   7 Units at 12/01/12 [phone]  . isosorbide dinitrate (ISORDIL) tablet 20 mg  20 mg Oral Daily Tarri Fuller, PA-C      . levothyroxine (SYNTHROID, LEVOTHROID) tablet 25 mcg  25 mcg Oral QAC breakfast Tarri Fuller, PA-C   25 mcg at 12/01/12 0548  . metoprolol succinate  (TOPROL-XL) 24 hr tablet 100 mg  100 mg Oral Daily Tarri Fuller, PA-C   100 mg at 11/30/12 1807  . nitroGLYCERIN (NITROSTAT) SL tablet 0.4 mg  0.4 mg Sublingual Q5 Min x 3 PRN Tarri Fuller, PA-C      . sodium chloride 0.9 % injection 3 mL  3 mL Intravenous PRN Lorretta Harp, MD        PE: General appearance: alert, cooperative, no distress and moderately obese Lungs: clear to auscultation bilaterally Heart: regular rate and rhythm, S1, S2 normal, no murmur, click, rub or gallop Extremities: no [name] Pulses: 2+ radials, 1+ DPs Skin: warm and dry Neurologic: Grossly normal  Lab Results:  No results found for this basename: WBC, HGB, HCT, PLT,  in the last 72 hours BMET No results found for this basename: NA, K, CL, CO2, GLUCOSE, BUN, CREATININE, CALCIUM,  in the last 72 hours   Assessment/Plan  Principal Problem:   PVD, LSFA PTA 12/12 Active Problems:   HTN (hypertension), malignant, patent renal arteries   Diabetes mellitus with chronic kidney disease  Plan: Pt admitted yesterday for pre-cath hydration. BMP pending. HR and BP both stable. Blood glucose on admission was  445. Pt is on scheduled Novolog and DM management was consulted yesterday. Pt's blood glucose remains elevated but has improved and is 211 this AM. Dr. Gwenlyn Found to see. Plan for PV angio later today if MD determines patient is stable.     LOS: 1 day    Brittainy M. Ladoris Gene 12/01/2012 7:53 AM  Agree with note written by Ellen Henri  PAC  Scr improved to 1.36. For PV angio today  Lorretta Harp 12/01/2012 11:37 AM

## 2012-12-02 ENCOUNTER — Other Ambulatory Visit: Payer: Self-pay | Admitting: Physician Assistant

## 2012-12-02 DIAGNOSIS — I252 Old myocardial infarction: Secondary | ICD-10-CM | POA: Diagnosis not present

## 2012-12-02 DIAGNOSIS — I70219 Atherosclerosis of native arteries of extremities with intermittent claudication, unspecified extremity: Secondary | ICD-10-CM | POA: Diagnosis not present

## 2012-12-02 DIAGNOSIS — E1129 Type 2 diabetes mellitus with other diabetic kidney complication: Secondary | ICD-10-CM | POA: Diagnosis not present

## 2012-12-02 DIAGNOSIS — Z951 Presence of aortocoronary bypass graft: Secondary | ICD-10-CM | POA: Diagnosis not present

## 2012-12-02 DIAGNOSIS — E119 Type 2 diabetes mellitus without complications: Secondary | ICD-10-CM | POA: Diagnosis not present

## 2012-12-02 DIAGNOSIS — I739 Peripheral vascular disease, unspecified: Secondary | ICD-10-CM

## 2012-12-02 DIAGNOSIS — I7092 Chronic total occlusion of artery of the extremities: Secondary | ICD-10-CM | POA: Diagnosis not present

## 2012-12-02 DIAGNOSIS — E785 Hyperlipidemia, unspecified: Secondary | ICD-10-CM | POA: Diagnosis not present

## 2012-12-02 DIAGNOSIS — Z9889 Other specified postprocedural states: Secondary | ICD-10-CM

## 2012-12-02 DIAGNOSIS — I2589 Other forms of chronic ischemic heart disease: Secondary | ICD-10-CM | POA: Diagnosis not present

## 2012-12-02 DIAGNOSIS — I10 Essential (primary) hypertension: Secondary | ICD-10-CM | POA: Diagnosis not present

## 2012-12-02 LAB — CBC
MCHC: 36 g/dL (ref 30.0–36.0)
Platelets: 237 10*3/uL (ref 150–400)
RDW: 12.9 % (ref 11.5–15.5)
WBC: 6.6 10*3/uL (ref 4.0–10.5)

## 2012-12-02 LAB — BASIC METABOLIC PANEL
BUN: 20 mg/dL (ref 6–23)
Calcium: 9.6 mg/dL (ref 8.4–10.5)
Creatinine, Ser: 1.44 mg/dL — ABNORMAL HIGH (ref 0.50–1.10)
GFR calc Af Amer: 45 mL/min — ABNORMAL LOW (ref >90)

## 2012-12-02 LAB — GLUCOSE, CAPILLARY: Glucose-Capillary: 331 mg/dL — ABNORMAL HIGH (ref 70–99)

## 2012-12-02 MED ORDER — HYDROCODONE-ACETAMINOPHEN 5-325 MG PO TABS: 2.0000 | ORAL_TABLET | Freq: Four times a day (QID) | ORAL | Status: DC | PRN

## 2012-12-02 MED ORDER — HYDROCODONE-ACETAMINOPHEN 5-325 MG PO TABS
2.0000 | ORAL_TABLET | ORAL | Status: DC | PRN
  Administered 2012-12-02: 09:00:00 2 via ORAL
  Filled 2012-12-02: qty 2

## 2012-12-02 NOTE — Progress Notes (Signed)
Site area: right groin  Site Prior to Removal:  Level 0  Pressure Applied For 20 MINUTES    Minutes Beginning at 0100  Manual:   yes  Patient Status During Pull:  WNL  Post Pull Groin Site:  Level 0  Post Pull Instructions Given:  yes  Post Pull Pulses Present:  yes  Dressing Applied:  yes  Comments:

## 2012-12-02 NOTE — Progress Notes (Addendum)
Subjective: Complains of right thigh pain.  Objective: Vital signs in last 24 hours: Temp:  [97.5 F (36.4 C)-99.3 F (37.4 C)] 97.7 F (36.5 C) (08/20 0800) Pulse Rate:  [77-99] 92 (08/20 0800) Resp:  [18-20] 18 (08/20 0800) BP: (108-224)/(35-106) 108/59 mmHg (08/20 0800) SpO2:  [94 %-100 %] 97 % (08/20 0800) Weight:  [246 lb 14.6 oz (112 kg)] 246 lb 14.6 oz (112 kg) (08/20 0030) Last BM Date: 11/30/12  Intake/Output from previous day: 08/19 0701 - 08/20 0700 In: 942.8 [P.O.:240; I.V.:702.8] Out: 2000 [Urine:2000] Intake/Output this shift:    Medications Current Facility-Administered Medications  Medication Dose Route Frequency Provider Last Rate Last Dose  . acetaminophen (TYLENOL) tablet 650 mg  650 mg Oral Q4H PRN Lorretta Harp, MD      . albuterol (PROVENTIL HFA;VENTOLIN HFA) 108 (90 BASE) MCG/ACT inhaler 2 puff  2 puff Inhalation Q6H PRN Tarri Fuller, PA-C      . amLODipine (NORVASC) tablet 10 mg  10 mg Oral Daily Tarri Fuller, PA-C   10 mg at 12/01/12 1233  . aspirin chewable tablet 81 mg  81 mg Oral Daily Tarri Fuller, PA-C      . clonazePAM (KLONOPIN) tablet 1 mg  1 mg Oral BID PRN Tarri Fuller, PA-C   1 mg at 12/01/12 1903  . cloNIDine (CATAPRES) tablet 0.2 mg  0.2 mg Oral Daily Tarri Fuller, PA-C   0.2 mg at 12/01/12 1233  . clopidogrel (PLAVIX) tablet 75 mg  75 mg Oral Daily Tarri Fuller, PA-C   75 mg at 11/30/12 1807  . furosemide (LASIX) tablet 20 mg  20 mg Oral Daily Tarri Fuller, PA-C      . gabapentin (NEURONTIN) capsule 400 mg  400 mg Oral Daily Tarri Fuller, PA-C   400 mg at 12/01/12 1233  . hydrALAZINE (APRESOLINE) injection 10 mg  10 mg Intravenous UD Lorretta Harp, MD   10 mg at 12/01/12 1902  . hydrALAZINE (APRESOLINE) injection 10 mg  10 mg Intravenous Q4H PRN Brittainy Simmons, PA-C   10 mg at 12/01/12 1605  . insulin aspart (novoLOG) injection 0-20 Units  0-20 Units Subcutaneous TID WC Larey Dresser, MD      . insulin aspart (novoLOG) injection  0-5 Units  0-5 Units Subcutaneous QHS Larey Dresser, MD   4 Units at 12/01/12 2345  . isosorbide dinitrate (ISORDIL) tablet 20 mg  20 mg Oral Daily Tarri Fuller, PA-C   20 mg at 12/01/12 1233  . levothyroxine (SYNTHROID, LEVOTHROID) tablet 25 mcg  25 mcg Oral QAC breakfast Tarri Fuller, PA-C   25 mcg at 12/01/12 1232  . living well with diabetes book MISC   Does not apply Once Lorretta Harp, MD      . metoprolol succinate (TOPROL-XL) 24 hr tablet 100 mg  100 mg Oral Daily Tarri Fuller, PA-C   100 mg at 12/01/12 1233  . morphine 2 MG/ML injection 2 mg  2 mg Intravenous Q1H PRN Lorretta Harp, MD   2 mg at 12/01/12 1710  . nitroGLYCERIN (NITROSTAT) SL tablet 0.4 mg  0.4 mg Sublingual Q5 Min x 3 PRN Tarri Fuller, PA-C      . nitroGLYCERIN 0.2 mg/mL in dextrose 5 % infusion  2-200 mcg/min Intravenous Titrated Cecilie Kicks, NP 6 mL/hr at 12/02/12 0600 20 mcg/min at 12/02/12 0600  . ondansetron (ZOFRAN) injection 4 mg  4 mg Intravenous Q6H PRN Lorretta Harp, MD  PE: General appearance: alert, cooperative and no distress Lungs: clear to auscultation bilaterally Heart: regular rate and rhythm, S1, S2 normal, no murmur, click, rub or gallop Extremities: No [name] Pulses: 2+ and symmetric 2+ left DP and 1+ right PT. Skin: No hematoma, ecchymosis or tenderness at cath site. Neurologic: Grossly normal  Lab Results:   Recent Labs  12/02/12 0500  WBC 6.6  HGB 11.6*  HCT 32.2*  PLT 237   BMET  Recent Labs  12/01/12 0710 12/02/12 0500  NA 137 132*  K 3.7 4.9  CL 103 97  CO2 25 24  GLUCOSE 237* 411*  BUN 17 20  CREATININE 1.36* 1.44*  CALCIUM 10.3 9.6    Assessment/Plan  Principal Problem:   PVD, LSFA PTA 12/12 Active Problems:   HTN (hypertension), malignant, patent renal arteries   Diabetes mellitus with chronic kidney disease  Plan:  SP PV angio and successful crossing of a CTO in the left SFA from in-stent restenosis" of a previously placed Nitinol self-expanding  stent 03/2011 with a Viance Crossing catheter and a Viabahn covered stent.  Moderate to severe tenderness in left thigh.  BP and HR stable.  Mild bump in SCr.  A1C 8.0.  CBG 331.  Will restart home insulin dosing at half.      LOS: 2 days    HAGER, BRYAN 12/02/2012 8:52 AM  I have seen and evaluated the patient this Am along with Tarri Fuller, PA. I agree with his findings, examination as well as impression recommendations.  S/p successful revascularization of LSFA CTO /ISR with covered stent -- concerning point this AM is her Left thigh tenderness.  Would not expect this - especially in setting of ISR (which limits likelihood of vessel perforation) that is even more mittigated by the use of a covered stent.  ?? If there is exclusion of collateral vessels. Bounding distal pulses.  Will d/\w Dr. Gwenlyn Found re: his thoughts -- ? CTA or [place] of RLE.    Poor glycemic control -- will restart home regimen, but will need OP f/u & management by PCP. BP also poorly controlled, requiring NTG gtt o/n.    Provided that no additional evaluation of the Leg is required after discussion with Dr. Gwenlyn Found, she is otherwise ready for d/c.  MD Time with pt: 15 min  HARDING,DAVID W, M.D., M.S. [facility] [address] [place],   [address]  [phone] Pager # [phone] 12/02/2012 9:17 AM  ADDENDUM: Discussed with Dr. Gwenlyn Found -- recommends d/c on analgesics with plan for OP LEA dopplers next week.  Leonie Man, MD

## 2012-12-02 NOTE — Progress Notes (Addendum)
Inpatient Diabetes Program Recommendations  AACE/ADA: New Consensus Statement on Inpatient Glycemic Control (2013)  Target Ranges:  Prepandial:   less than 140 mg/dL      Peak postprandial:   less than 180 mg/dL (1-2 hours)      Critically ill patients:  140 - 180 mg/dL   Reason for Visit:  Results for Brooke Stafford, Brooke Stafford (MRN [id number]) as of 12/02/2012 11:11  Ref. Range 12/01/2012 11:17 12/01/2012 11:50 12/01/2012 16:57 12/01/2012 16:59 12/01/2012 22:11 12/02/2012 05:00 12/02/2012 08:57  Glucose-Capillary Latest Range: 70-99 mg/dL 196 (H)  229 (H)  317 (H)  331 (H)   Spoke briefly to patient regarding home diabetes management.  She states that she is interested in seeing a diabetes specialist.  She currently takes 70/30 60 to 70 units bid at home.  She states that she did not take her insulin the morning she came into the hospital because she could not eat.  She asked regarding her "sweet attacks" and her shaky episodes where she wants sweets.  Reviewed symptoms of hypoglycemia and proper treatment regarding 15 grams of CHO and recheck CBG in 15 minutes.  Patient states that she has glucose tablets that she keeps with her at all times.  Discussed importance of not skipping meals and having a routine with 70/30 insulin.  She had education in 2004 but would likely benefit from refresher 1:1 with CDE.  A1C=8.0%.  Patient plans to call Dr. Vista Lawman to request referral to see diabetes specialist.

## 2012-12-02 NOTE — Discharge Summary (Signed)
I saw the patient on the day of d/c.   Only notable concern on day of d/c was L thigh bruise.  Dr. Gwenlyn Found thinks this is most likely secondary to tissue stretching from stent deployment. Plan is d/c home with analgesics & OP LEA/Dopplers prior to ROV with Dr. Gwenlyn Found.  Leonie Man, MD

## 2012-12-02 NOTE — Progress Notes (Signed)
Called and discussed patient with PA, Alberteen Spindle.  Requested that 1/2 of patients home dose of 70/30 be restarted.

## 2012-12-02 NOTE — Discharge Summary (Signed)
Physician Discharge Summary  Patient ID: Brooke Stafford MRN: [id number] DOB/AGE: November 12, 1955 57 y.o.  Admit date: 11/30/2012 Discharge date: 12/02/2012  Admission Diagnoses:  Discharge Diagnoses:  Principal Problem:   PVD, LSFA PTA 12/12 Active Problems:   HTN (hypertension), malignant, patent renal arteries   Diabetes mellitus with chronic kidney disease   Discharged Condition: stable  Hospital Course:   The patient is a 57 year old woman with severe type 2 diabetes mellitus requiring insulin therapy, systemic hypertension, hyperlipidemia, obesity, PVD S/P LSFA PTA Dec 2012, coronary artery disease, S/P CABG x 6 in 2002. Cardiac catheterization was done in July 2011 when she presented with sepsis and medication non compliance and had an NSTEMI from demand ischemia. Cath then showed a patent LIMA to LAD, patent SVG to diagonal, and patent sequential SVG to branches of the right coronary artery. The plan was for medical Rx. She has a mild ischemic cardiomyopathy with a left ventricular ejection fraction of 45% to 50% by echo 5/11.   She was seen by Kerin Ransom [facility] on 10/22/12 with complaints of left lower externa claudication of the last several months similar to her preintervention symptoms. A followup Doppler performed 10/02/12 revealed a decrease in her left ABI 0.56 with an occluded left SFA.  She was admitted and underwent successful crossing of a CTO in the left SFA from "in-stent restenosis" of a previously placed Nitinol self-expanding stent 03/2011 with a Viance Crossing catheter and a Viabahn covered stent.  She received 300 mg of additional Plavix.  She was started on low dose IV nitro due to elevated BP with excellent results.  The following day she was complaining of left thigh pain.  Analgesics were prescribed short term and LEA dopplers will be preformed in the out patient setting.  Also, due to very elevated glucose levels, a diabetes consult was requested.   In addition, the  patient will be set up for an outpatient nutrition and diabetes consult.  The patient was seen by Dr. Ellyn Hack who felt she was stable for DC home.  Consults: Diabetes [place]oor  Significant Diagnostic Studies:  PROCEDURE DESCRIPTION:  The patient was brought to the second floor [facility]iac cath lab in the postabsorptive state. She was premedicated with Valium 5 mg by mouth, IV Versed and fentanyl. Her right groinwas prepped and shaved in usual sterile fashion. Xylocaine 1% was used for local anesthesia. A 5 French sheath was inserted into the right common femoral  artery using standard Seldinger technique.a 5 French pigtail catheter was used for distal abdominal aortography and bilateral iliac artery angiography. A 5 French crossover catheter and then hole was used to obtain contralateral access with catheter placement in the left external iliac artery. Left lower extremity angiography with runoff was performed using bolus chase the digital subtraction settable technique. Right lower extremity angiography runoff was used via the injection through the SideArm sheath. Visipaque dye was used for the entirety of the case. Retrograde aortic pressure was monitored during the case.  HEMODYNAMICS:  AO SYSTOLIC/AO DIASTOLIC: [id number]  ANGIOGRAPHIC RESULTS:  1: Distal abdominal aortogram-the distal aorta was widely patent without atherosclerotic or aneurysmal changes  2: Left lower extremity-the left SFA was occluded from just past the origin with occlusion of the previously placed SFA stent reconstitution in the distal popliteal via profunda femoris collaterals. There was three-vessel runoff  3: Right lower extremity-there was a 75% focal proximal, 50% segmental mid and 60% focal distal right SFA stenosis with one vessel runoff via the peroneal.  Posterior tibials occluded. The anterior tibial had a long 99% segmental proximal stenosis  IMPRESSION:Acord has an occluded left SFA secondary to "in-stent  restenosis". I'm going to proceed with attempt at percutaneous revascularization of the chronic total occlusion using a Viance crossing device and a Viabahn covered stent.  Procedure description: The patient received a total of 8000 units of heparin intravenously with an ending ACT of 217. A total of 183 cc of contrast was administered to the patient. Using a Viance CTO catheter along with an 014/300 cm length [place] core wire the CTO was crossed with moderate difficulty. Following this balloon angioplasty was performed with a 5 mm x 120 mm long balloon throughout the entirety of the stented segment as well as the proximal portion of the CTO. A Viabahn 5 mm x 250 mm long Covered stent was then carefully positioned and deployed beyond the previously stented segment up to the origin of the SFA and deployed successfully. It was then post dilated with a 5 mm x 120 mm long balloon at nominal pressures resulting in reduction of a total occlusion to 0% residual with three-vessel runoff and excellent flow. There was a 50-60% stenosis in the above-the-knee popliteal artery.  Final impression: Successful crossing of a CTO in the left SFA from in-stent restenosis" of a previously placed Nitinol self-expanding stent 03/2011 with a Viance Crossing catheter and a Viabahn covered stent. The patient received 300 mg of additional Plavix. The sheath was withdrawn across the bifurcation and exchanged over an 035 wire for a short 7 [place] sheath. The patient left the lab in stable condition. The sheath will be removed once the ACT falls below 170 and pressure will be held on the groin to achieve hemostasis. The patient will be hydrated overnight and discharged home in the morning on aspirin Plavix. She'll get outpatient arterial Doppler studies in our office and followup with me thereafter.  Lorretta Harp MD, [facility]  12/01/2012  11:24 AM  Treatments: See above  Discharge Exam: Blood pressure 108/59, pulse 92, temperature  97.7 F (36.5 C), temperature source Oral, resp. rate 18, height 5\' 10"  (1.778 m), weight 246 lb 14.6 oz (112 kg), SpO2 97.00%.   Disposition: 01-Home or Self Care  Discharge Orders   Future Appointments Provider Department Dept Phone   12/23/2012 9:15 AM Lorretta Harp, MD [place] [phone]   Future Orders Complete By Expires   Ambulatory referral to Nutrition and Diabetic Education  As directed    Diet - low sodium heart healthy  As directed    Discharge instructions  As directed    Comments:     No lifting more than a half gallon of milk or driving for three days.   Increase activity slowly  As directed        Medication List         albuterol 108 (90 BASE) MCG/ACT inhaler  Commonly known as:  PROVENTIL HFA;VENTOLIN HFA  - Inhale 2 puffs into the lungs every 6 (six) hours as needed. For shortness of breath  -      amLODipine 10 MG tablet  Commonly known as:  NORVASC  Take 10 mg by mouth daily.     aspirin 81 MG tablet  Take 81 mg by mouth daily.     BEN GAY GREASELESS 10-15 % greaseless cream  Apply 1 application topically daily as needed for pain (for foot).     clonazePAM 1 MG tablet  Commonly known as:  KLONOPIN  Take 1 mg by mouth 2 (two) times daily as needed for anxiety.     cloNIDine 0.2 MG tablet  Commonly known as:  CATAPRES  Take 0.2 mg by mouth daily.     clopidogrel 75 MG tablet  Commonly known as:  PLAVIX  Take 75 mg by mouth daily.     fluticasone 50 MCG/ACT nasal spray  Commonly known as:  FLONASE  Place 1 spray into the nose daily as needed for rhinitis or allergies.     furosemide 20 MG tablet  Commonly known as:  LASIX  Take 20 mg by mouth daily.     gabapentin 400 MG capsule  Commonly known as:  NEURONTIN  Take 400 mg by mouth daily.     glimepiride 4 MG tablet  Commonly known as:  AMARYL  Take 4 mg by mouth 2 (two) times daily.     HYDROcodone-acetaminophen 5-325 MG per tablet   Commonly known as:  NORCO/VICODIN  Take 2 tablets by mouth every 6 (six) hours as needed.     isosorbide dinitrate 20 MG tablet  Commonly known as:  ISORDIL  Take 20 mg by mouth daily.     levothyroxine 25 MCG tablet  Commonly known as:  SYNTHROID, LEVOTHROID  Take 25 mcg by mouth daily before breakfast.     metoprolol 200 MG 24 hr tablet  Commonly known as:  TOPROL-XL  Take 100 mg by mouth daily.     nitroGLYCERIN 0.4 MG SL tablet  Commonly known as:  NITROSTAT  - Place 0.4 mg under the tongue every 5 (five) minutes as needed. For chest  -      NOVOLOG MIX 70/30 FLEXPEN [place]  Inject 60-70 Units into the skin 2 (two) times daily.     ranitidine 75 MG tablet  Commonly known as:  ZANTAC  Take 75 mg by mouth daily as needed for heartburn.     TRADJENTA 5 MG Tabs tablet  Generic drug:  linagliptin  Take 5 mg by mouth daily.     traMADol 50 MG tablet  Commonly known as:  ULTRAM  Take 1 tablet (50 mg total) by mouth every 8 (eight) hours as needed. For pain/Maximum dose= 8 tablets per day           Follow-up Information   Follow up with Lorretta Harp, MD On 12/23/2012. (9:15 am)    Specialty:  Cardiology   Contact information:   [address] [place] [address] [phone]       Signed: Tarri Fuller 12/02/2012, 11:55 AM

## 2012-12-08 ENCOUNTER — Telehealth (HOSPITAL_COMMUNITY): Payer: Self-pay | Admitting: Cardiovascular Disease

## 2012-12-09 ENCOUNTER — Telehealth (HOSPITAL_COMMUNITY): Payer: Self-pay | Admitting: Cardiovascular Disease

## 2012-12-09 DIAGNOSIS — I10 Essential (primary) hypertension: Secondary | ICD-10-CM | POA: Diagnosis not present

## 2012-12-09 DIAGNOSIS — N2581 Secondary hyperparathyroidism of renal origin: Secondary | ICD-10-CM | POA: Diagnosis not present

## 2012-12-09 DIAGNOSIS — R809 Proteinuria, unspecified: Secondary | ICD-10-CM | POA: Diagnosis not present

## 2012-12-09 DIAGNOSIS — E1129 Type 2 diabetes mellitus with other diabetic kidney complication: Secondary | ICD-10-CM | POA: Diagnosis not present

## 2012-12-09 DIAGNOSIS — N179 Acute kidney failure, unspecified: Secondary | ICD-10-CM | POA: Diagnosis not present

## 2012-12-09 NOTE — Telephone Encounter (Signed)
Pt called says her legs are still hurting and swollen bad.Says she needs something for pain. Had procedure on 12-01-12.

## 2012-12-09 NOTE — Telephone Encounter (Signed)
Returned call.  Left message to call back before 4pm.  Of note, pt was discharged on hydrocodone #15 prn pain and is supposed to have lower ext arterial doppler.

## 2012-12-10 ENCOUNTER — Telehealth: Payer: Self-pay | Admitting: *Deleted

## 2012-12-10 NOTE — Telephone Encounter (Signed)
Returned call.  Left message to call back before 4pm if assistance still needed.  Will await call back from pt.

## 2012-12-10 NOTE — Telephone Encounter (Signed)
Pt called back r/t pain meds.  Stated she is still having pain in her leg.  Pt did schedule appt for doppler (9/5) and f/u with Dr. Gwenlyn Found (9/10).  Pt stated she took the pain med she was given every 6 hours b/c she was in pain and wanted to know if Dr. Gwenlyn Found wants to call her in something else for pain.  Stated she has been taking tylenol PM and it helps a little, but she doesn't know what she can take b/c of her kidneys.    RN informed pt pain med was temporary and intended for long-term use.  Pt informed Dr. Gwenlyn Found will be notified and advised to avoid taking IBU or Aleve.  RN attempted to give further advice and pt stated she knows she can't take IBU and Aleve and no longer wanted message sent to Dr. Gwenlyn Found.  Stated she will take what she has until she is seen.  Call ended.    Of note, pt sounded drowsy, which may be related to her taking Tylenol PM.  However, pt refused further advice from RN.  Message forwarded to Dr. Gwenlyn Found for review and further instructions

## 2012-12-16 NOTE — Telephone Encounter (Signed)
Okay to be seen on September 10

## 2012-12-18 ENCOUNTER — Ambulatory Visit (HOSPITAL_COMMUNITY)
Admission: RE | Admit: 2012-12-18 | Discharge: 2012-12-18 | Disposition: A | Discharge status: Home / Self Care | Payer: Medicare Other | Source: Ambulatory Visit | Attending: Cardiology | Admitting: Cardiology

## 2012-12-18 DIAGNOSIS — I739 Peripheral vascular disease, unspecified: Secondary | ICD-10-CM

## 2012-12-18 NOTE — Progress Notes (Signed)
Lower Extremity Arterial Duplex Completed. °Brianna L Mazza,RVT °

## 2012-12-22 ENCOUNTER — Encounter: Payer: Self-pay | Admitting: *Deleted

## 2012-12-22 ENCOUNTER — Telehealth: Payer: Self-pay | Admitting: *Deleted

## 2012-12-22 DIAGNOSIS — I739 Peripheral vascular disease, unspecified: Secondary | ICD-10-CM

## 2012-12-22 NOTE — Telephone Encounter (Signed)
Message copied by Chauncy Lean on Tue Dec 22, 2012 10:04 PM ------      Message from: Lorretta Harp      Created: Tue Dec 22, 2012  4:52 PM       Significant improvement of LABI as a result of stenting. Re check in 8 months ------

## 2012-12-22 NOTE — Telephone Encounter (Signed)
Order placed for repeat lower extremity arterial dopplers in 8 months

## 2012-12-23 ENCOUNTER — Ambulatory Visit: Payer: Medicare Other | Admitting: Cardiovascular Disease

## 2012-12-27 ENCOUNTER — Encounter: Payer: Self-pay | Admitting: *Deleted

## 2012-12-28 DIAGNOSIS — N179 Acute kidney failure, unspecified: Secondary | ICD-10-CM | POA: Diagnosis not present

## 2012-12-30 ENCOUNTER — Encounter: Payer: Self-pay | Admitting: Cardiovascular Disease

## 2012-12-30 ENCOUNTER — Ambulatory Visit (INDEPENDENT_AMBULATORY_CARE_PROVIDER_SITE_OTHER): Payer: Medicare Other | Admitting: Cardiovascular Disease

## 2012-12-30 VITALS — BP 140/60 | HR 84 | Resp 20 | Ht 70.5 in | Wt 244.9 lb

## 2012-12-30 DIAGNOSIS — I10 Essential (primary) hypertension: Secondary | ICD-10-CM

## 2012-12-30 DIAGNOSIS — Z951 Presence of aortocoronary bypass graft: Secondary | ICD-10-CM

## 2012-12-30 DIAGNOSIS — E782 Mixed hyperlipidemia: Secondary | ICD-10-CM

## 2012-12-30 DIAGNOSIS — E785 Hyperlipidemia, unspecified: Secondary | ICD-10-CM

## 2012-12-30 MED ORDER — TRAMADOL HCL 50 MG PO TABS: 50.0000 mg | ORAL_TABLET | Freq: Three times a day (TID) | ORAL | Status: DC | PRN

## 2012-12-30 MED ORDER — ATORVASTATIN CALCIUM 40 MG PO TABS: 40.0000 mg | ORAL_TABLET | Freq: Every day | ORAL | Status: DC

## 2012-12-30 NOTE — Patient Instructions (Addendum)
Start Atorvastatin 40mg  daily at bedtime.  Refill on Ultram #60 only.  No refills.  Get FASTING labwork done in 3 months.  Your physician recommends that you schedule a follow-up appointment in: 3 months.

## 2013-01-01 DIAGNOSIS — R609 Edema, unspecified: Secondary | ICD-10-CM | POA: Diagnosis not present

## 2013-01-01 DIAGNOSIS — I10 Essential (primary) hypertension: Secondary | ICD-10-CM | POA: Diagnosis not present

## 2013-01-01 DIAGNOSIS — E785 Hyperlipidemia, unspecified: Secondary | ICD-10-CM | POA: Diagnosis not present

## 2013-01-01 DIAGNOSIS — E119 Type 2 diabetes mellitus without complications: Secondary | ICD-10-CM | POA: Diagnosis not present

## 2013-01-01 DIAGNOSIS — E039 Hypothyroidism, unspecified: Secondary | ICD-10-CM | POA: Diagnosis not present

## 2013-01-01 DIAGNOSIS — F411 Generalized anxiety disorder: Secondary | ICD-10-CM | POA: Diagnosis not present

## 2013-01-01 DIAGNOSIS — K3189 Other diseases of stomach and duodenum: Secondary | ICD-10-CM | POA: Diagnosis not present

## 2013-01-01 DIAGNOSIS — Z23 Encounter for immunization: Secondary | ICD-10-CM | POA: Diagnosis not present

## 2013-01-01 DIAGNOSIS — M545 Low back pain: Secondary | ICD-10-CM | POA: Diagnosis not present

## 2013-01-06 ENCOUNTER — Ambulatory Visit: Payer: Medicare Other | Admitting: Cardiovascular Disease

## 2013-01-08 ENCOUNTER — Ambulatory Visit (INDEPENDENT_AMBULATORY_CARE_PROVIDER_SITE_OTHER): Payer: Medicare Other | Admitting: Cardiovascular Disease

## 2013-01-08 ENCOUNTER — Encounter: Payer: Self-pay | Admitting: Cardiovascular Disease

## 2013-01-08 VITALS — BP 132/80 | HR 59 | Ht 70.5 in | Wt 249.1 lb

## 2013-01-08 DIAGNOSIS — I739 Peripheral vascular disease, unspecified: Secondary | ICD-10-CM | POA: Diagnosis not present

## 2013-01-08 DIAGNOSIS — R079 Chest pain, unspecified: Secondary | ICD-10-CM | POA: Diagnosis not present

## 2013-01-08 NOTE — Progress Notes (Signed)
01/08/2013 Brooke Stafford   Mar 06, 1956  [id number]  Primary Physician Benito Mccreedy, MD Primary Cardiologist: Lorretta Harp MD [name]   HPI:  The patient is a 57 year old woman with severe type 2 diabetes mellitus requiring insulin therapy, systemic hypertension, hyperlipidemia, obesity, PVD S/P LSFA PTA Dec 2012, coronary artery disease, S/P CABG x 6 in 2002. Cardiac catheterization was done in July 2011 when she presented with sepsis and medication non compliance and had an NSTEMI from demand ischemia. Cath then showed a patent LIMA to LAD, patent SVG to diagonal, and patent sequential SVG to branches of the right coronary artery. The plan was for medical Rx. She has a mild ischemic cardiomyopathy with a left ventricular ejection fraction of 45% to 50% by echo 5/11.  She was seen by Kerin Ransom [facility] on 10/22/12 with complaints of left lower externa claudication of the last several months similar to her preintervention symptoms. A followup Doppler performed 10/02/12 revealed a decrease in her left ABI 2.56 with an occluded left SFA. I re\re angiogramed her on 11/30/12 revealing an occluded left SFA stent. I recanalized her left lower extremity  chronic total occlusion and placed a 5 mm x 250 mm long Gore Viabahn  endoprosthesis.     Current Outpatient Prescriptions  Medication Sig Dispense Refill  . albuterol (PROVENTIL HFA;VENTOLIN HFA) 108 (90 BASE) MCG/ACT inhaler Inhale 2 puffs into the lungs every 6 (six) hours as needed. For shortness of breath        . amLODipine (NORVASC) 10 MG tablet Take 10 mg by mouth daily.        [name] aspirin 81 MG tablet Take 81 mg by mouth daily.        [name] atorvastatin (LIPITOR) 40 MG tablet Take 1 tablet (40 mg total) by mouth daily.  90 tablet  3  . clonazePAM (KLONOPIN) 1 MG tablet Take 1 mg by mouth 2 (two) times daily as needed for anxiety.      . cloNIDine (CATAPRES) 0.2 MG tablet Take 0.2 mg by mouth daily.       . clopidogrel (PLAVIX)  75 MG tablet Take 75 mg by mouth daily.      . fluticasone (FLONASE) 50 MCG/ACT nasal spray Place 1 spray into the nose daily as needed for rhinitis or allergies.      . furosemide (LASIX) 20 MG tablet Take 20 mg by mouth daily.       [name] gabapentin (NEURONTIN) 400 MG capsule Take 400 mg by mouth daily.        [name] glimepiride (AMARYL) 4 MG tablet Take 4 mg by mouth 2 (two) times daily.        . Insulin Aspart Prot & Aspart (NOVOLOG MIX 70/30 FLEXPEN [place]) Inject 60-70 Units into the skin 2 (two) times daily.      . isosorbide dinitrate (ISORDIL) 20 MG tablet Take 20 mg by mouth daily.      [name] levothyroxine (SYNTHROID, LEVOTHROID) 25 MCG tablet Take 25 mcg by mouth daily before breakfast.      . linagliptin (TRADJENTA) 5 MG TABS tablet Take 5 mg by mouth daily.      . Menthol-Methyl Salicylate (BEN GAY GREASELESS) 10-15 % greaseless cream Apply 1 application topically daily as needed for pain (for foot).      . metoprolol (TOPROL-XL) 200 MG 24 hr tablet Take 100 mg by mouth daily.       . nitroGLYCERIN (NITROSTAT) 0.4 MG SL tablet Place 0.4 mg under the  tongue every 5 (five) minutes as needed. For chest        . ranitidine (ZANTAC) 75 MG tablet Take 75 mg by mouth daily as needed for heartburn.      . traMADol (ULTRAM) 50 MG tablet Take 1 tablet (50 mg total) by mouth every 8 (eight) hours as needed. For pain/Maximum dose= 8 tablets per day  60 tablet  0   No current facility-administered medications for this visit.    Allergies  Allergen Reactions  . Penicillins Cross Reactors Hives    And high fever  . Adhesive [Tape] Rash    bruising    History   Social History  . Marital Status: Divorced    Spouse Name: N/A    Number of Children: N/A  . Years of Education: N/A   Occupational History  . Not on file.   Social History Main Topics  . Smoking status: Former Smoker -- 1.00 packs/day for 25 years    Types: Cigarettes    Quit date: 04/04/2000  . Smokeless tobacco: Never Used  . Alcohol  Use: Yes     Comment: 11/30/2012 "have a glass of red wine on my birthday q yr; that's it"  . Drug Use: No  . Sexual Activity: Not Currently    Birth Control/ Protection: Abstinence   Other Topics Concern  . Not on file   Social History Narrative  . No narrative on file     Review of Systems: General: negative for chills, fever, night sweats or weight changes.  Cardiovascular: negative for chest pain, dyspnea on exertion, edema, orthopnea, palpitations, paroxysmal nocturnal dyspnea or shortness of breath Dermatological: negative for rash Respiratory: negative for cough or wheezing Urologic: negative for hematuria Abdominal: negative for nausea, vomiting, diarrhea, bright red blood per rectum, melena, or hematemesis Neurologic: negative for visual changes, syncope, or dizziness All other systems reviewed and are otherwise negative except as noted above.    Blood pressure 132/80, pulse 59, height 5' 10.5" (1.791 m), weight 249 lb 1.6 oz (112.991 kg).  General appearance: alert and no distress Neck: no adenopathy, no carotid bruit, no JVD, supple, symmetrical, trachea midline and thyroid not enlarged, symmetric, no tenderness/mass/nodules Lungs: clear to auscultation bilaterally Heart: regular rate and rhythm, S1, S2 normal, no murmur, click, rub or gallop Extremities: extremities normal, atraumatic, no cyanosis or edema  EKG not performed today  ASSESSMENT AND PLAN:   PVD, LSFA PTA 12/12 History of left SFA PTA and stenting December 2012. She saw Kerin Ransom [facility] on 10/22/12 complaining of left lower extremity claudication similar to her preintervention symptoms. Dopplers performed on 10/02/12 revealed a left ABI of 0.56 an occluded left SFA. I reentered into her 11/30/12 and recanalized her long segment chronic total occlusion with a Gore Viabahn  endoprosthesis (5 mm x 250 mm).her followup Dopplers performed 12/18/12 revealed a widely patent left SFA with an increase in her left ABI  0.92. Her claudication has resolved.      Lorretta Harp MD FACP,FACC,FAHA, [facility] 01/08/2013 4:27 PM

## 2013-01-08 NOTE — Assessment & Plan Note (Signed)
History of left SFA PTA and stenting December 2012. She saw Kerin Ransom [facility] on 10/22/12 complaining of left lower extremity claudication similar to her preintervention symptoms. Dopplers performed on 10/02/12 revealed a left ABI of 0.56 an occluded left SFA. I reentered into her 11/30/12 and recanalized her long segment chronic total occlusion with a Gore Viabahn  endoprosthesis (5 mm x 250 mm).her followup Dopplers performed 12/18/12 revealed a widely patent left SFA with an increase in her left ABI 0.92. Her claudication has resolved.

## 2013-01-08 NOTE — Patient Instructions (Addendum)
Follow up with Dr Loletha Grayer in December.  You will continue to have dopplers on your legs every 6 months.

## 2013-01-09 NOTE — Assessment & Plan Note (Signed)
Not sure why her statin was stopped. We'll start atorvastatin 40 mg daily and recheck lipid profile in 3 months.

## 2013-01-09 NOTE — Assessment & Plan Note (Signed)
Fair control. Have to balance the need for tight blood pressure control to avoid progression of chronic kidney disease with her orthostatic hypotension.

## 2013-01-09 NOTE — Progress Notes (Signed)
Patient ID: Brooke Stafford, female   DOB: 06/12/1955, 57 y.o.   MRN: [id number]     Reason for office visit CAD, PAD, mixed hyperlipidemia, diabetes mellitus  Brooke Stafford has actually done well since her last appointment. She has had a few issues with what sounds like orthostatic hypotension. Sometimes she staggers when she gets out of bed in walks to the bathroom. She sometimes feels a pulling sensation in her chest but this appears to be related to her keloid sternotomy scar since angina or exertional dyspnea.  Her stress test in June showed an inferior wall scar with preserved overall left ventricular systolic function and absence of reversible ischemia.   Had evidence of recurrent stenosis/occlusion of the left superficial femoral artery and a repeat intervention by Dr. Gwenlyn Found and August 18. Earlier this month followup duplex ultrasonography showed a patent left superficial femoral artery and a left ABI of 0.9. This led to resolution of her claudication symptoms  Allergies  Allergen Reactions  . Penicillins Cross Reactors Hives    And high fever  . Adhesive [Tape] Rash    bruising    Current Outpatient Prescriptions  Medication Sig Dispense Refill  . albuterol (PROVENTIL HFA;VENTOLIN HFA) 108 (90 BASE) MCG/ACT inhaler Inhale 2 puffs into the lungs every 6 (six) hours as needed. For shortness of breath        . amLODipine (NORVASC) 10 MG tablet Take 10 mg by mouth daily.        [name] aspirin 81 MG tablet Take 81 mg by mouth daily.        . clonazePAM (KLONOPIN) 1 MG tablet Take 1 mg by mouth 2 (two) times daily as needed for anxiety.      . cloNIDine (CATAPRES) 0.2 MG tablet Take 0.2 mg by mouth daily.       . clopidogrel (PLAVIX) 75 MG tablet Take 75 mg by mouth daily.      . fluticasone (FLONASE) 50 MCG/ACT nasal spray Place 1 spray into the nose daily as needed for rhinitis or allergies.      . furosemide (LASIX) 20 MG tablet Take 20 mg by mouth daily.       [name] gabapentin (NEURONTIN)  400 MG capsule Take 400 mg by mouth daily.        [name] glimepiride (AMARYL) 4 MG tablet Take 4 mg by mouth 2 (two) times daily.        . Insulin Aspart Prot & Aspart (NOVOLOG MIX 70/30 FLEXPEN [place]) Inject 60-70 Units into the skin 2 (two) times daily.      . isosorbide dinitrate (ISORDIL) 20 MG tablet Take 20 mg by mouth daily.      [name] levothyroxine (SYNTHROID, LEVOTHROID) 25 MCG tablet Take 25 mcg by mouth daily before breakfast.      . linagliptin (TRADJENTA) 5 MG TABS tablet Take 5 mg by mouth daily.      . Menthol-Methyl Salicylate (BEN GAY GREASELESS) 10-15 % greaseless cream Apply 1 application topically daily as needed for pain (for foot).      . metoprolol (TOPROL-XL) 200 MG 24 hr tablet Take 100 mg by mouth daily.       . nitroGLYCERIN (NITROSTAT) 0.4 MG SL tablet Place 0.4 mg under the tongue every 5 (five) minutes as needed. For chest        . ranitidine (ZANTAC) 75 MG tablet Take 75 mg by mouth daily as needed for heartburn.      . traMADol (ULTRAM) 50 MG tablet Take  1 tablet (50 mg total) by mouth every 8 (eight) hours as needed. For pain/Maximum dose= 8 tablets per day  60 tablet  0  . atorvastatin (LIPITOR) 40 MG tablet Take 1 tablet (40 mg total) by mouth daily.  90 tablet  3   No current facility-administered medications for this visit.    Past Medical History  Diagnosis Date  . Asthma   . Anxiety   . GERD (gastroesophageal reflux disease)   . Coronary artery disease 2002    CABG x 6. Cath 5/11- med Rx  . Hypertension   . Peripheral vascular disease 12/12    LSFA PTA  . Anemia   . Hyperlipidemia   . Chronic renal insufficiency, stage II (mild)     followed  by [facility]  . CAD (coronary artery disease) 2002    CABG x 6 2002, cath 2011- med Rx  . Hypothyroid     treated  . Obesity (BMI 35.0-39.9 without comorbidity)   . CHF (congestive heart failure)     "in 2002" (11/26/2012)  . Myocardial infarction 2000; 2002; 2011?  [name] Chronic bronchitis     "q year; in the  winter" (11/30/2012)  . Pneumonia     "3 times I think" (11/30/2012)  . Type II diabetes mellitus   . Migraines     "couple times/year" (11/30/2012)  . Arthritis     "stiff fingers and knees" (11/30/2012)  . Depression     Past Surgical History  Procedure Laterality Date  . Cholecystectomy  1982  . Cesarean section  1978; 1980  . Tubal ligation  1980  . Breast cyst excision Right 1970's  . Coronary artery bypass graft  11/20/2000    x6 LIMA to distal LAD, svg to first diag, svg to ramus intermediate branch and swquential SVG to cir marginal branch, SVG to posterior descending coronary and sequential SVG to first right posterolateral branch  . Cardiac catheterization  2002  . Coronary angioplasty with stent placement  2004; 2012    "I have 2 stents" 11/30/2012)  . Peripheral arterial stent graft Left     SFA/notes 04/07/2011 (11/30/2012)  . Nm myocar perf wall motion  08/27/2004    negative    Family History  Problem Relation Age of Onset  . Diabetes Mother   . Hypertension Mother   . Stroke Mother   . Hypertension Father   . Hypertension Brother   . Hypertension Sister   . Diabetes Sister   . Hyperlipidemia Sister     History   Social History  . Marital Status: Divorced    Spouse Name: N/A    Number of Children: N/A  . Years of Education: N/A   Occupational History  . Not on file.   Social History Main Topics  . Smoking status: Former Smoker -- 1.00 packs/day for 25 years    Types: Cigarettes    Quit date: 04/04/2000  . Smokeless tobacco: Never Used  . Alcohol Use: Yes     Comment: 11/30/2012 "have a glass of red wine on my birthday q yr; that's it"  . Drug Use: No  . Sexual Activity: Not Currently    Birth Control/ Protection: Abstinence   Other Topics Concern  . Not on file   Social History Narrative  . No narrative on file    Review of systems: The patient specifically denies any chest pain at rest or with exertion, dyspnea at rest or with exertion,  orthopnea, paroxysmal nocturnal dyspnea, syncope,  palpitations, focal neurological deficits, intermittent claudication, lower extremity edema, unexplained weight gain, cough, hemoptysis or wheezing.  The patient also denies abdominal pain, nausea, vomiting, dysphagia, diarrhea, constipation, polyuria, polydipsia, dysuria, hematuria, frequency, urgency, abnormal bleeding or bruising, fever, chills, unexpected weight changes, mood swings, change in skin or hair texture, change in voice quality, auditory or visual problems, allergic reactions or rashes, new musculoskeletal complaints other than usual "aches and pains".   PHYSICAL EXAM BP 140/60  Pulse 84  Resp 20  Ht 5' 10.5" (1.791 m)  Wt 244 lb 14.4 oz (111.086 kg)  BMI 34.63 kg/m2 . General: Alert, oriented x3, no distress Head: no evidence of trauma, PERRL, EOMI, no exophtalmos or lid lag, no myxedema, no xanthelasma; normal ears, nose and oropharynx Neck: normal jugular venous pulsations and no hepatojugular reflux; brisk carotid pulses without delay and no carotid bruits Chest: clear to auscultation, no signs of consolidation by percussion or palpation, normal fremitus, symmetrical and full respiratory excursions; large keloid scar at her sternotomy site Cardiovascular: normal position and quality of the apical impulse, regular rhythm, normal first and second heart sounds, no murmurs, rubs or gallops Abdomen: no tenderness or distention, no masses by palpation, no abnormal pulsatility or arterial bruits, normal bowel sounds, no hepatosplenomegaly Extremities: no clubbing, cyanosis or edema; 2+ radial, ulnar and brachial pulses bilaterally; 2+ right femoral, posterior tibial and dorsalis pedis pulses; 2+ left femoral, posterior tibial and dorsalis pedis pulses; no subclavian or femoral bruits Neurological: grossly nonfocal   EKG: Sinus rhythm, chronic nonspecific T wave inversion in the lateral leads  Lipid Panel     Component Value  Date/Time   CHOL 238* 01/06/2011 0114   TRIG 403* 01/06/2011 0114   HDL 33* 01/06/2011 0114   CHOLHDL 7.2 01/06/2011 0114   VLDL UNABLE TO CALCULATE IF TRIGLYCERIDE OVER 400 mg/dL 01/06/2011 0114   LDLCALC UNABLE TO CALCULATE IF TRIGLYCERIDE OVER 400 mg/dL 01/06/2011 0114    BMET    Component Value Date/Time   NA 132* 12/02/2012 0500   K 4.9 12/02/2012 0500   CL 97 12/02/2012 0500   CO2 24 12/02/2012 0500   GLUCOSE 411* 12/02/2012 0500   BUN 20 12/02/2012 0500   CREATININE 1.44* 12/02/2012 0500   CREATININE 1.64* 11/25/2012 1202   CALCIUM 9.6 12/02/2012 0500   GFRNONAA 39* 12/02/2012 0500   GFRAA 45* 12/02/2012 0500     ASSESSMENT AND PLAN HTN (hypertension), malignant, patent renal arteries Fair control. Have to balance the need for tight blood pressure control to avoid progression of chronic kidney disease with her orthostatic hypotension.  Hx of CABG x 6 2002, cath 2011 patent grafts, medical Rx No angina. Recent nuclear stress test without high risk findings. Preserved overall left ventricle systolic function. No signs or symptoms of congestive heart failure.  Dyslipidemia Not sure why her statin was stopped. We'll start atorvastatin 40 mg daily and recheck lipid profile in 3 months.   Orders Placed This Encounter  Procedures  . Lipid Profile   Meds ordered this encounter  Medications  . atorvastatin (LIPITOR) 40 MG tablet    Sig: Take 1 tablet (40 mg total) by mouth daily.    Dispense:  90 tablet    Refill:  3  . traMADol (ULTRAM) 50 MG tablet    Sig: Take 1 tablet (50 mg total) by mouth every 8 (eight) hours as needed. For pain/Maximum dose= 8 tablets per day    Dispense:  60 tablet    Refill:  0  Holli Humbles, MD, [place] [facility] [place] [phone] office [phone] pager

## 2013-01-09 NOTE — Assessment & Plan Note (Signed)
No angina. Recent nuclear stress test without high risk findings. Preserved overall left ventricle systolic function. No signs or symptoms of congestive heart failure.

## 2013-01-22 ENCOUNTER — Other Ambulatory Visit: Payer: Self-pay | Admitting: *Deleted

## 2013-01-22 MED ORDER — CLOPIDOGREL BISULFATE 75 MG PO TABS: 75.0000 mg | ORAL_TABLET | Freq: Every day | ORAL | Status: DC

## 2013-02-01 ENCOUNTER — Ambulatory Visit: Payer: Medicare Other | Attending: Internal Medicine | Admitting: Internal Medicine

## 2013-02-01 ENCOUNTER — Encounter: Payer: Self-pay | Admitting: Internal Medicine

## 2013-02-01 VITALS — BP 158/92 | HR 82 | Temp 98.0°F | Resp 16 | Ht 71.0 in | Wt 245.0 lb

## 2013-02-01 DIAGNOSIS — N182 Chronic kidney disease, stage 2 (mild): Secondary | ICD-10-CM | POA: Insufficient documentation

## 2013-02-01 DIAGNOSIS — Z8249 Family history of ischemic heart disease and other diseases of the circulatory system: Secondary | ICD-10-CM | POA: Insufficient documentation

## 2013-02-01 DIAGNOSIS — Z794 Long term (current) use of insulin: Secondary | ICD-10-CM | POA: Insufficient documentation

## 2013-02-01 DIAGNOSIS — Z0000 Encounter for general adult medical examination without abnormal findings: Secondary | ICD-10-CM | POA: Diagnosis not present

## 2013-02-01 DIAGNOSIS — E669 Obesity, unspecified: Secondary | ICD-10-CM | POA: Insufficient documentation

## 2013-02-01 DIAGNOSIS — E119 Type 2 diabetes mellitus without complications: Secondary | ICD-10-CM | POA: Insufficient documentation

## 2013-02-01 DIAGNOSIS — I509 Heart failure, unspecified: Secondary | ICD-10-CM | POA: Insufficient documentation

## 2013-02-01 DIAGNOSIS — Z880 Allergy status to penicillin: Secondary | ICD-10-CM | POA: Diagnosis not present

## 2013-02-01 DIAGNOSIS — J45909 Unspecified asthma, uncomplicated: Secondary | ICD-10-CM | POA: Diagnosis not present

## 2013-02-01 DIAGNOSIS — E785 Hyperlipidemia, unspecified: Secondary | ICD-10-CM | POA: Insufficient documentation

## 2013-02-01 DIAGNOSIS — Z79899 Other long term (current) drug therapy: Secondary | ICD-10-CM | POA: Insufficient documentation

## 2013-02-01 DIAGNOSIS — I10 Essential (primary) hypertension: Secondary | ICD-10-CM | POA: Diagnosis not present

## 2013-02-01 DIAGNOSIS — K219 Gastro-esophageal reflux disease without esophagitis: Secondary | ICD-10-CM | POA: Diagnosis not present

## 2013-02-01 DIAGNOSIS — I739 Peripheral vascular disease, unspecified: Secondary | ICD-10-CM | POA: Diagnosis not present

## 2013-02-01 DIAGNOSIS — Z951 Presence of aortocoronary bypass graft: Secondary | ICD-10-CM | POA: Insufficient documentation

## 2013-02-01 DIAGNOSIS — I129 Hypertensive chronic kidney disease with stage 1 through stage 4 chronic kidney disease, or unspecified chronic kidney disease: Secondary | ICD-10-CM | POA: Insufficient documentation

## 2013-02-01 DIAGNOSIS — Z833 Family history of diabetes mellitus: Secondary | ICD-10-CM | POA: Diagnosis not present

## 2013-02-01 DIAGNOSIS — F329 Major depressive disorder, single episode, unspecified: Secondary | ICD-10-CM | POA: Insufficient documentation

## 2013-02-01 DIAGNOSIS — E039 Hypothyroidism, unspecified: Secondary | ICD-10-CM | POA: Diagnosis not present

## 2013-02-01 DIAGNOSIS — I70209 Unspecified atherosclerosis of native arteries of extremities, unspecified extremity: Secondary | ICD-10-CM | POA: Diagnosis not present

## 2013-02-01 DIAGNOSIS — Z6835 Body mass index (BMI) 35.0-35.9, adult: Secondary | ICD-10-CM | POA: Diagnosis not present

## 2013-02-01 DIAGNOSIS — I252 Old myocardial infarction: Secondary | ICD-10-CM | POA: Diagnosis not present

## 2013-02-01 DIAGNOSIS — F3289 Other specified depressive episodes: Secondary | ICD-10-CM | POA: Insufficient documentation

## 2013-02-01 DIAGNOSIS — I2510 Atherosclerotic heart disease of native coronary artery without angina pectoris: Secondary | ICD-10-CM | POA: Insufficient documentation

## 2013-02-01 DIAGNOSIS — F411 Generalized anxiety disorder: Secondary | ICD-10-CM | POA: Diagnosis not present

## 2013-02-01 DIAGNOSIS — E1169 Type 2 diabetes mellitus with other specified complication: Secondary | ICD-10-CM

## 2013-02-01 MED ORDER — FUROSEMIDE 20 MG PO TABS: 20.0000 mg | ORAL_TABLET | Freq: Every day | ORAL | Status: DC

## 2013-02-01 MED ORDER — FLUTICASONE PROPIONATE 50 MCG/ACT NA SUSP: 1.0000 | Freq: Every day | NASAL | Status: DC | PRN

## 2013-02-01 MED ORDER — TRAMADOL HCL 50 MG PO TABS: 50.0000 mg | ORAL_TABLET | Freq: Three times a day (TID) | ORAL | Status: DC | PRN

## 2013-02-01 NOTE — Patient Instructions (Signed)
Diabetes and Exercise Regular exercise is important and can help:   Control blood glucose (sugar).  Decrease blood pressure.    Control blood lipids (cholesterol, triglycerides).  Improve overall health. BENEFITS FROM EXERCISE  Improved fitness.  Improved flexibility.  Improved endurance.  Increased bone density.  Weight control.  Increased muscle strength.  Decreased body fat.  Improvement of the body's use of insulin, a hormone.  Increased insulin sensitivity.  Reduction of insulin needs.  Reduced stress and tension.  Helps you feel better. People with diabetes who add exercise to their lifestyle gain additional benefits, including:  Weight loss.  Reduced appetite.  Improvement of the body's use of blood glucose.  Decreased risk factors for heart disease:  Lowering of cholesterol and triglycerides.  Raising the level of good cholesterol (high-density lipoproteins, HDL).  Lowering blood sugar.  Decreased blood pressure. TYPE 1 DIABETES AND EXERCISE  Exercise will usually lower your blood glucose.  If blood glucose is greater than 240 mg/dl, check urine ketones. If ketones are present, do not exercise.  Location of the insulin injection sites may need to be adjusted with exercise. Avoid injecting insulin into areas of the body that will be exercised. For example, avoid injecting insulin into:  The arms when playing tennis.  The legs when jogging. For more information, discuss this with your caregiver.  Keep a record of:  Food intake.  Type and amount of exercise.  Expected peak times of insulin action.  Blood glucose levels. Do this before, during, and after exercise. Review your records with your caregiver. This will help you to develop guidelines for adjusting food intake and insulin amounts.  TYPE 2 DIABETES AND EXERCISE  Regular physical activity can help control blood glucose.  Exercise is important because it may:  Increase the  body's sensitivity to insulin.  Improve blood glucose control.  Exercise reduces the risk of heart disease. It decreases serum cholesterol and triglycerides. It also lowers blood pressure.  Those who take insulin or oral hypoglycemic agents should watch for signs of hypoglycemia. These signs include dizziness, shaking, sweating, chills, and confusion.  Body water is lost during exercise. It must be replaced. This will help to avoid loss of body fluids (dehydration) or heat stroke. Be sure to talk to your caregiver before starting an exercise program to make sure it is safe for you. Remember, any activity is better than none.  Document Released: 06/22/2003 Document Revised: 06/24/2011 Document Reviewed: 10/06/2008 [facility] Patient Information 2014 [place], [place]. Hypertension As your heart beats, it forces blood through your arteries. This force is your blood pressure. If the pressure is too high, it is called hypertension (HTN) or high blood pressure. HTN is dangerous because you may have it and not know it. High blood pressure may mean that your heart has to work harder to pump blood. Your arteries may be narrow or stiff. The extra work puts you at risk for heart disease, stroke, and other problems.  Blood pressure consists of two numbers, a higher number over a lower, 110/72, for example. It is stated as "110 over 72." The ideal is below 120 for the top number (systolic) and under 80 for the bottom (diastolic). Write down your blood pressure today. You should pay close attention to your blood pressure if you have certain conditions such as:  Heart failure.  Prior heart attack.  Diabetes  Chronic kidney disease.  Prior stroke.  Multiple risk factors for heart disease. To see if you have HTN, your blood  pressure should be measured while you are seated with your arm held at the level of the heart. It should be measured at least twice. A one-time elevated blood pressure reading (especially  in the Emergency Department) does not mean that you need treatment. There may be conditions in which the blood pressure is different between your right and left arms. It is important to see your caregiver soon for a recheck. Most people have essential hypertension which means that there is not a specific cause. This type of high blood pressure may be lowered by changing lifestyle factors such as:  Stress.  Smoking.  Lack of exercise.  Excessive weight.  Drug/tobacco/alcohol use.  Eating less salt. Most people do not have symptoms from high blood pressure until it has caused damage to the body. Effective treatment can often prevent, delay or reduce that damage. TREATMENT  When a cause has been identified, treatment for high blood pressure is directed at the cause. There are a large number of medications to treat HTN. These fall into several categories, and your caregiver will help you select the medicines that are best for you. Medications may have side effects. You should review side effects with your caregiver. If your blood pressure stays high after you have made lifestyle changes or started on medicines,   Your medication(s) may need to be changed.  Other problems may need to be addressed.  Be certain you understand your prescriptions, and know how and when to take your medicine.  Be sure to follow up with your caregiver within the time frame advised (usually within two weeks) to have your blood pressure rechecked and to review your medications.  If you are taking more than one medicine to lower your blood pressure, make sure you know how and at what times they should be taken. Taking two medicines at the same time can result in blood pressure that is too low. SEEK IMMEDIATE MEDICAL CARE IF:  You develop a severe headache, blurred or changing vision, or confusion.  You have unusual weakness or numbness, or a faint feeling.  You have severe chest or abdominal pain, vomiting, or  breathing problems. MAKE SURE YOU:   Understand these instructions.  Will watch your condition.  Will get help right away if you are not doing well or get worse. Document Released: 04/01/2005 Document Revised: 06/24/2011 Document Reviewed: 11/20/2007 [facility] Patient Information 2014 [place]. Atherosclerosis Atherosclerosis, or hardening of the arteries, is the buildup of plaque within the major arteries in the body. Plaque is made up of fats (lipids), cholesterol, calcium, and fibrous tissue. Plaque can narrow or block blood flow within an artery. Plaque can break off and cause damage to the affected organ. Plaque can also "rupture." When plaque ruptures within an artery, a clot can form, causing a sudden (acute) blockage of the artery. Untreated atherosclerosis can cause serious health problems or death.  COMMON ATHEROSCLEROSIS RISK FACTORS  High cholesterol levels.  Smoking.  Obesity.  Lack of activity or exercise.  Eating a diet high in saturated fat.  Family history.  Diabetes. SYMPTOMS  Symptoms of atherosclerosis can occur when blood flow to an artery is slowed or blocked. Severity and onset of symptoms depends on how extensive the narrowing or blockage is. A sudden plaque rupture can bring immediate, life-threatening symptoms. Atherosclerosis can affect different arteries in the body, for example:  Coronary arteries. The coronary arteries supply the heart with blood. When the coronary arteries are narrowed or blocked from atherosclerosis, this is  known as coronary artery disease (CAD). CAD can cause a heart attack. Common heart attack symptoms include:  Chest pain or pain that radiates to the neck, arm, jaw, or in the upper, middle back (mid-scapular pain).  Shortness of breath without cause.  Profuse sweating while at rest.  Irregular heartbeats.  Nausea or gastrointestinal upset.  Carotid arteries. The carotid arteries supply the brain with blood. They are  located on each side of your neck. When blood flow to these arteries is slowed or blocked, a transiant ischemic attack (TIA) or stroke can occur. A TIA is considered a "mini-stroke" or "warning stroke." TIA symptoms are the same as stroke symptoms, but they are temporary and last less than 24 hours. A stroke can cause permanent damage or death. Common TIA and stroke symptoms include:  Sudden numbness or weakness to one side of your body, such as the face, arm, or leg.  Sudden confusion or trouble speaking or understanding.  Sudden trouble seeing out of one or both eyes.  Sudden trouble walking, loss of balance, or dizziness.  Sudden, severe headache with no known cause.  Arteries in the legs. When arteries in the lower legs become narrowed or blocked, this is known as peripheral vascular disease(PVD). PVD can cause a symptom called claudication. Claudication is pain or a burning feeling in your legs when walking or exercising and usually goes away with rest. Very severe PVD can cause pain in your legs while at rest.  Renal arteries. The renal arteries supply the kidneys with blood. Blockage of the renal arteries can cause a decline in kidney function or high blood pressure (hypertension).  Gastrointestinal arteries (mesenteric circulation). Abdominal pain may occur after eating. DIAGNOSIS  Your caregiver may perform the following tests to diagnose atherosclerosis:  Blood tests.  Stress Test.  Echocardiogram.  Nuclear scan.  Ankle/brachial index.  Ultrasonography.  Computed tomography (CT) scan.  Angiography. TREATMENT  Atherosclerosis treatment includes the following:  Lifestyle changes such as:  Quitting smoking. Your caregiver can help you with smoking cessation.  Eat a diet low in saturated fat. A registered dietician can educate you on healthy food options such as helping you understand the difference between good fat and bad fat.  Following an exercise program  approved by your caregiver.  Maintaining a healthy weight. Lose weight as approved by your caregiver.  Have your cholesterol levels checked as directed by your caregiver.  Medicines. Cholesterol medicines can help slow or stop the progression of atherosclerosis.  Different procedural or surgical interventions to treat atherosclerosis include:  Balloon angioplasty. The technical name for balloon angioplasty is called percutaneous transluminal angioplasty(PTA). In this procedure, a catheter with a small balloon at the tip is inserted through the blocked or narrowed artery. The balloon is then inflated. When the balloon is inflated, the fatty plaque is compressed against the artery wall, allowing better blood flow within the artery.  Balloon angioplasty and stenting. In this procedure, balloon angioplasty is combined with a stenting procedure. A stent is a small, metal mesh tube that keeps the artery open. After the artery is opened up by the balloon technique, the stent is then deployed. The stent is permanent.  Open heart surgery or bypass surgery. To perform this type of surgery, a healthy vessel is first "harvested" from either the leg or arm. The harvested vessel is then used to "bypass" the blocked atherosclerotic vessel so new blood flow can be established.  Atherectomy. Atherectomy is a procedure that uses a catheter with a  sharp blade to remove plaque from an artery. A chamber in the catheter collects the plaque.  Endarterectomy. An endarterectomy is a surgical procedure where a surgeon removes plaque from an artery.  Amputation. When blockages in the lower legs are very severe and circulation cannot be restored, amputation may be required. SEEK IMMEDIATE MEDICAL CARE IF:  You are having heart attack symptoms, such as:  Chest pain or pain that radiates to the neck, arm, jaw, or in the upper, middle back (mid-scapular pain).  Shortness of breath without cause.  Profuse sweating  while at rest.  Irregular heartbeats.  Nausea or gastrointestinal upset.  You are having stroke symptoms, such as sudden:  Numbness or weakness to one side of your body, such as the face, arm, or leg.  Confusion or trouble speaking or understanding.  Trouble seeing out of one or both eyes.  Trouble walking, loss of balance, or dizziness.  Severe headache with no known cause.  Your hands or feet are bluish, cold, or you have pain in them.  You have bad abdominal pain after eating. Seek help immediately if you have heart attack or stroke symptoms. Do not drive yourself to the hospital. Call your local emergency service immediately! Do not wait to see if these symptoms go away: Document Released: 06/22/2003 Document Revised: 10/01/2011 Document Reviewed: 06/04/2011 [facility] Patient Information 2014 [place]. DASH Diet The DASH diet stands for "Dietary Approaches to Stop Hypertension." It is a healthy eating plan that has been shown to reduce high blood pressure (hypertension) in as little as 14 days, while also possibly providing other significant health benefits. These other health benefits include reducing the risk of breast cancer after menopause and reducing the risk of type 2 diabetes, heart disease, colon cancer, and stroke. Health benefits also include weight loss and slowing kidney failure in patients with chronic kidney disease.  DIET GUIDELINES  Limit salt (sodium). Your diet should contain less than 1500 mg of sodium daily.  Limit refined or processed carbohydrates. Your diet should include mostly whole grains. Desserts and added sugars should be used sparingly.  Include small amounts of heart-healthy fats. These types of fats include nuts, oils, and tub margarine. Limit saturated and trans fats. These fats have been shown to be harmful in the body. CHOOSING FOODS  The following food groups are based on a 2000 calorie diet. See your Registered Dietitian for individual  calorie needs. Grains and Grain Products (6 to 8 servings daily)  Eat More Often: Whole-wheat bread, brown rice, whole-grain or wheat pasta, quinoa, popcorn without added fat or salt (air popped).  Eat Less Often: White bread, white pasta, white rice, cornbread. Vegetables (4 to 5 servings daily)  Eat More Often: Fresh, frozen, and canned vegetables. Vegetables may be raw, steamed, roasted, or grilled with a minimal amount of fat.  Eat Less Often/Avoid: Creamed or fried vegetables. Vegetables in a cheese sauce. Fruit (4 to 5 servings daily)  Eat More Often: All fresh, canned (in natural juice), or frozen fruits. Dried fruits without added sugar. One hundred percent fruit juice ( cup [237 mL] daily).  Eat Less Often: Dried fruits with added sugar. Canned fruit in light or heavy syrup. [employer], Fish, and Poultry (2 servings or less daily. One serving is 3 to 4 oz [85-114 g]).  Eat More Often: Ninety percent or leaner ground beef, tenderloin, sirloin. Round cuts of beef, chicken breast, [place] breast. All fish. Grill, bake, or broil your meat. Nothing should be fried.  Eat Less Often/Avoid: Fatty cuts of meat, [place], or chicken leg, thigh, or wing. Fried cuts of meat or fish. Dairy (2 to 3 servings)  Eat More Often: Low-fat or fat-free milk, low-fat plain or light yogurt, reduced-fat or part-skim cheese.  Eat Less Often/Avoid: Milk (whole, 2%).Whole milk yogurt. Full-fat cheeses. Nuts, Seeds, and Legumes (4 to 5 servings per week)  Eat More Often: All without added salt.  Eat Less Often/Avoid: Salted nuts and seeds, canned beans with added salt. Fats and Sweets (limited)  Eat More Often: Vegetable oils, tub margarines without trans fats, sugar-free gelatin. Mayonnaise and salad dressings.  Eat Less Often/Avoid: Coconut oils, palm oils, butter, stick margarine, cream, half and half, cookies, candy, pie. FOR MORE INFORMATION The Dash Diet Eating Plan: www.dashdiet.org Document  Released: 03/21/2011 Document Revised: 06/24/2011 Document Reviewed: 03/21/2011 [facility] Patient Information 2014 [place], [place].

## 2013-02-01 NOTE — Progress Notes (Signed)
Pt is here today to establish care.  Pt has diabetes and HTN. The only C.C. She has rhinitis.

## 2013-02-01 NOTE — Progress Notes (Signed)
Patient ID: Brooke Stafford, female   DOB: [birth year], 57 y.o.   MRN: [id number] Patient Demographics  Brooke Stafford, is a 57 y.o. female  [id number]  [id number]  DOB - June 14, 1955  CC:  Chief Complaint  Patient presents with  . Establish Care       HPI: Brooke Stafford is a 57 y.o. female here today to establish medical care. She has extensive medical history for coronary artery disease, status post CABG x 6 vessels in 2002, a repeat cath in 2001 showed patent grafts, dyslipidemia, type 2 diabetes mellitus requiring insulin therapy, severe peripheral vascular disease status post stenting on the left, uncontrolled hypertension and congestive heart failure, hypothyroidism, chronic kidney disease. She is here today to establish medical care. Her primary care physician fired her recently because of multiple no shows. She follows up regularly with cardiologist and she is on multiple medications as listed below. She has no specific complaint today except for ongoing bilateral leg swelling. She attributed this to not taking her furosemide in the last few days because she ran out of medication.  She denies any chest pain today. No shortness of breath. She has appointment with cardiologist coming up. She claims she regularly have her blood drawn every month for kidney functions. She has had cholecystectomy. She does not smoke cigarette, she does not drink alcohol Patient has No headache, No chest pain, No abdominal pain - No Nausea, No new weakness tingling or numbness, No Cough - SOB.  Allergies  Allergen Reactions  . Penicillins Cross Reactors Hives    And high fever  . Adhesive [Tape] Rash    bruising   Past Medical History  Diagnosis Date  . Asthma   . Anxiety   . GERD (gastroesophageal reflux disease)   . Coronary artery disease 2002    CABG x 6. Cath 5/11- med Rx  . Hypertension   . Peripheral vascular disease 12/12    LSFA PTA  . Anemia   . Hyperlipidemia   . Chronic renal  insufficiency, stage II (mild)     followed  by [facility]  . CAD (coronary artery disease) 2002    CABG x 6 2002, cath 2011- med Rx  . Hypothyroid     treated  . Obesity (BMI 35.0-39.9 without comorbidity)   . CHF (congestive heart failure)     "in 2002" (11/26/2012)  . Myocardial infarction 2000; 2002; 2011?  [name] Chronic bronchitis     "q year; in the winter" (11/30/2012)  . Pneumonia     "3 times I think" (11/30/2012)  . Type II diabetes mellitus   . Migraines     "couple times/year" (11/30/2012)  . Arthritis     "stiff fingers and knees" (11/30/2012)  . Depression    Current Outpatient Prescriptions on File Prior to Visit  Medication Sig Dispense Refill  . albuterol (PROVENTIL HFA;VENTOLIN HFA) 108 (90 BASE) MCG/ACT inhaler Inhale 2 puffs into the lungs every 6 (six) hours as needed. For shortness of breath        . amLODipine (NORVASC) 10 MG tablet Take 10 mg by mouth daily.        [name] aspirin 81 MG tablet Take 81 mg by mouth daily.        [name] atorvastatin (LIPITOR) 40 MG tablet Take 1 tablet (40 mg total) by mouth daily.  90 tablet  3  . clonazePAM (KLONOPIN) 1 MG tablet Take 1 mg by mouth 2 (two) times daily as needed for anxiety.      [name]  cloNIDine (CATAPRES) 0.2 MG tablet Take 0.2 mg by mouth daily.       . clopidogrel (PLAVIX) 75 MG tablet Take 1 tablet (75 mg total) by mouth daily.  30 tablet  6  . gabapentin (NEURONTIN) 400 MG capsule Take 400 mg by mouth daily.        . Insulin Aspart Prot & Aspart (NOVOLOG MIX 70/30 FLEXPEN [place]) Inject 60-70 Units into the skin 2 (two) times daily.      . isosorbide dinitrate (ISORDIL) 20 MG tablet Take 20 mg by mouth daily.      [name] levothyroxine (SYNTHROID, LEVOTHROID) 25 MCG tablet Take 25 mcg by mouth daily before breakfast.      . linagliptin (TRADJENTA) 5 MG TABS tablet Take 5 mg by mouth daily.      . Menthol-Methyl Salicylate (BEN GAY GREASELESS) 10-15 % greaseless cream Apply 1 application topically daily as needed for pain (for foot).       . metoprolol (TOPROL-XL) 200 MG 24 hr tablet Take 100 mg by mouth daily.       . nitroGLYCERIN (NITROSTAT) 0.4 MG SL tablet Place 0.4 mg under the tongue every 5 (five) minutes as needed. For chest        . ranitidine (ZANTAC) 75 MG tablet Take 75 mg by mouth daily as needed for heartburn.      [name] glimepiride (AMARYL) 4 MG tablet Take 4 mg by mouth 2 (two) times daily.         No current facility-administered medications on file prior to visit.   Family History  Problem Relation Age of Onset  . Diabetes Mother   . Hypertension Mother   . Stroke Mother   . Hypertension Father   . Hypertension Brother   . Hypertension Sister   . Diabetes Sister   . Hyperlipidemia Sister    History   Social History  . Marital Status: Divorced    Spouse Name: N/A    Number of Children: N/A  . Years of Education: N/A   Occupational History  . Not on file.   Social History Main Topics  . Smoking status: Former Smoker -- 1.00 packs/day for 25 years    Types: Cigarettes    Quit date: 04/04/2000  . Smokeless tobacco: Never Used  . Alcohol Use: Yes     Comment: 11/30/2012 "have a glass of red wine on my birthday q yr; that's it"  . Drug Use: No  . Sexual Activity: Not Currently    Birth Control/ Protection: Abstinence   Other Topics Concern  . Not on file   Social History Narrative  . No narrative on file    Review of Systems: Constitutional: Negative for fever, chills, diaphoresis, activity change, appetite change and fatigue. HENT: Negative for ear pain, nosebleeds, congestion, facial swelling, rhinorrhea, neck pain, neck stiffness and ear discharge.  Eyes: Negative for pain, discharge, redness, itching and visual disturbance. Respiratory: Negative for cough, choking, chest tightness, shortness of breath, wheezing and stridor.  Cardiovascular: Negative for chest pain, palpitations and leg swelling. Gastrointestinal: Negative for abdominal distention. Genitourinary: Negative for  dysuria, urgency, frequency, hematuria, flank pain, decreased urine volume, difficulty urinating and dyspareunia.  Musculoskeletal: Negative for back pain, ++ bilateral leg swelling Neurological: Negative for dizziness, tremors, seizures, syncope, facial asymmetry, speech difficulty, weakness, light-headedness, numbness and headaches.  Hematological: Negative for adenopathy. Does not bruise/bleed easily. Psychiatric/Behavioral: Negative for hallucinations, behavioral problems, confusion, dysphoric mood, decreased concentration and agitation.    Objective:  Filed Vitals:   02/01/13 1130  BP: 158/92  Pulse: 82  Temp: 98 F (36.7 C)  Resp: 16    Physical Exam: Constitutional: Patient appears well-developed and well-nourished. No distress. Obese HENT: Normocephalic, atraumatic, External right and left ear normal. Oropharynx is clear and moist.  Eyes: Conjunctivae and EOM are normal. PERRLA, no scleral icterus. Neck: Normal ROM. Neck supple. No JVD. No tracheal deviation. No thyromegaly. CVS: RRR, S1/S2 +, no murmurs, no gallops, no carotid bruit.  Pulmonary: Effort and breath sounds normal, no stridor, rhonchi, wheezes, rales.  Abdominal: Soft. BS +, no distension, tenderness, rebound or guarding. Multiple healed previous surgical scars. Musculoskeletal: Normal range of motion. Bilateral lower limbs pitting edema up to the shin.   Lymphadenopathy: No lymphadenopathy noted, cervical, inguinal or axillary Neuro: Alert. Normal reflexes, muscle tone coordination. No cranial nerve deficit. Skin: Skin is warm and dry. No rash noted. Not diaphoretic. No erythema. No pallor. Psychiatric: Normal mood and affect. Behavior, judgment, thought content normal.  Lab Results  Component Value Date   WBC 6.6 12/02/2012   HGB 11.6* 12/02/2012   HCT 32.2* 12/02/2012   MCV 89.7 12/02/2012   PLT 237 12/02/2012   Lab Results  Component Value Date   CREATININE 1.44* 12/02/2012   BUN 20 12/02/2012   NA 132*  12/02/2012   K 4.9 12/02/2012   CL 97 12/02/2012   CO2 24 12/02/2012    Lab Results  Component Value Date   HGBA1C 8.0* 11/30/2012   Lipid Panel     Component Value Date/Time   CHOL 238* 01/06/2011 0114   TRIG 403* 01/06/2011 0114   HDL 33* 01/06/2011 0114   CHOLHDL 7.2 01/06/2011 0114   VLDL UNABLE TO CALCULATE IF TRIGLYCERIDE OVER 400 mg/dL 01/06/2011 0114   LDLCALC UNABLE TO CALCULATE IF TRIGLYCERIDE OVER 400 mg/dL 01/06/2011 0114       Assessment and plan:   Patient Active Problem List   Diagnosis Date Noted  . Atherosclerotic peripheral vascular disease 02/01/2013  . Diabetes mellitus type 2 in obese 02/01/2013  . CAD (coronary artery disease) 02/01/2013  . S/P CABG (coronary artery bypass graft) 02/01/2013  . Essential hypertension, benign 02/01/2013  . Unspecified hypothyroidism 02/01/2013  . PVD, LSFA PTA 12/12 04/06/2011  . HTN (hypertension), malignant, patent renal arteries 04/06/2011  . Hx of CABG x 6 2002, cath 2011 patent grafts, medical Rx 04/06/2011  . Diabetes mellitus with chronic kidney disease 04/06/2011  . Dyslipidemia 04/06/2011    Plan: Refill the following medications: Furosemide 20 mg tablet by mouth daily Tramadol 50 mg tablet by mouth every 8 hours when necessary pain Flonase nasal spray as prescribed  Patient has been extensively counseled about her diagnosis and comorbidities, she was counseled on diet and exercise. We have discussed blood pressure and blood sugar goals, needs for medication compliance as well as followup compliance with primary care and cardiologist.      Health Maintenance  -Influenza decline today  Follow up in 4 weeks or when necessary. For BP check and lab draws for hemoglobin A1c and comprehensive metabolic panel.    The patient was given clear instructions to go to ER or return to medical center if symptoms don't improve, worsen or new problems develop. The patient verbalized understanding. The patient was told to call to  get lab results if they haven't heard anything in the next week.     Angelica Chessman, MD, MHA, FACP, [place], [place]  [phone]   02/01/2013, 12:23 PM

## 2013-02-09 DIAGNOSIS — N2581 Secondary hyperparathyroidism of renal origin: Secondary | ICD-10-CM | POA: Diagnosis not present

## 2013-02-09 DIAGNOSIS — I10 Essential (primary) hypertension: Secondary | ICD-10-CM | POA: Diagnosis not present

## 2013-02-09 DIAGNOSIS — D509 Iron deficiency anemia, unspecified: Secondary | ICD-10-CM | POA: Diagnosis not present

## 2013-02-09 DIAGNOSIS — E1129 Type 2 diabetes mellitus with other diabetic kidney complication: Secondary | ICD-10-CM | POA: Diagnosis not present

## 2013-02-18 ENCOUNTER — Other Ambulatory Visit: Payer: Self-pay

## 2013-03-01 ENCOUNTER — Ambulatory Visit (HOSPITAL_COMMUNITY)
Admission: RE | Admit: 2013-03-01 | Discharge: 2013-03-01 | Disposition: A | Discharge status: Home / Self Care | Payer: Medicare Other | Source: Ambulatory Visit | Attending: Internal Medicine | Admitting: Internal Medicine

## 2013-03-01 ENCOUNTER — Ambulatory Visit: Payer: Medicare Other | Attending: Internal Medicine | Admitting: Internal Medicine

## 2013-03-01 VITALS — BP 168/108 | HR 71 | Temp 99.3°F | Resp 16 | Ht 70.5 in | Wt 244.0 lb

## 2013-03-01 DIAGNOSIS — E119 Type 2 diabetes mellitus without complications: Secondary | ICD-10-CM | POA: Diagnosis not present

## 2013-03-01 DIAGNOSIS — I255 Ischemic cardiomyopathy: Secondary | ICD-10-CM | POA: Insufficient documentation

## 2013-03-01 DIAGNOSIS — N1832 Chronic kidney disease, stage 3b: Secondary | ICD-10-CM

## 2013-03-01 DIAGNOSIS — I2510 Atherosclerotic heart disease of native coronary artery without angina pectoris: Secondary | ICD-10-CM

## 2013-03-01 DIAGNOSIS — M79609 Pain in unspecified limb: Secondary | ICD-10-CM | POA: Insufficient documentation

## 2013-03-01 DIAGNOSIS — M19049 Primary osteoarthritis, unspecified hand: Secondary | ICD-10-CM | POA: Diagnosis not present

## 2013-03-01 DIAGNOSIS — I2589 Other forms of chronic ischemic heart disease: Secondary | ICD-10-CM | POA: Diagnosis not present

## 2013-03-01 DIAGNOSIS — M169 Osteoarthritis of hip, unspecified: Secondary | ICD-10-CM | POA: Diagnosis not present

## 2013-03-01 LAB — URIC ACID: Uric Acid, Serum: 9.6 mg/dL — ABNORMAL HIGH (ref 2.4–7.0)

## 2013-03-01 MED ORDER — INSULIN ASPART PROT & ASPART (70-30 MIX) 100 UNIT/ML PEN: 70.0000 [IU] | PEN_INJECTOR | Freq: Two times a day (BID) | SUBCUTANEOUS | Status: DC

## 2013-03-01 MED ORDER — INSULIN ASPART PROT & ASPART (70-30 MIX) 100 UNIT/ML PEN: 75.0000 [IU] | PEN_INJECTOR | Freq: Two times a day (BID) | SUBCUTANEOUS | Status: DC

## 2013-03-01 MED ORDER — CLONIDINE HCL 0.2 MG PO TABS: 0.2000 mg | ORAL_TABLET | Freq: Three times a day (TID) | ORAL | Status: DC

## 2013-03-01 MED ORDER — TRAMADOL HCL 50 MG PO TABS: 50.0000 mg | ORAL_TABLET | Freq: Three times a day (TID) | ORAL | Status: DC | PRN

## 2013-03-01 NOTE — Progress Notes (Unsigned)
Pt is here toady to follow up on her diabetes and HTN Pt is having pain in all her joints and muscle spasms in her lower back.

## 2013-03-01 NOTE — Progress Notes (Unsigned)
Patient ID: Brooke Stafford, female   DOB: 01/13/1956, 57 y.o.   MRN: [id number]   Patient Demographics  Brooke Stafford, is a 57 y.o. female  [id number]  [id number]  DOB - 01/01/1956  Chief Complaint  Patient presents with  . Follow-up        Subjective:   Brooke Stafford with History of severe type 2 diabetes mellitus requiring insulin therapy, systemic hypertension, hyperlipidemia, obesity, PVD S/P LSFA PTA Dec 2012, coronary artery disease, PAD, follow with Dr. Donnella Bi for her cardiology problems, is here for routine followup visit, says her sugars have been running in excess of 250 at home, also has developed some joint pains and aches for the last 6-8 months mostly in her left hip and right hand, denies any fever chills. Denies any focal weakness the   Denies any subjective complaints except as above, no active headache, no chest abdominal pain at this time, not short of breath. No focal weakness which is new.    Objective:    Patient Active Problem List   Diagnosis Date Noted  . CKD 4, baseline creatinine around 1.4 03/01/2013  . Atherosclerotic peripheral vascular disease 02/01/2013  . Diabetes mellitus type 2 in obese 02/01/2013  . CAD (coronary artery disease) 02/01/2013  . S/P CABG (coronary artery bypass graft) 02/01/2013  . Essential hypertension, benign 02/01/2013  . Unspecified hypothyroidism 02/01/2013  . PVD, LSFA PTA 12/12 04/06/2011  . HTN (hypertension), malignant, patent renal arteries 04/06/2011  . Hx of CABG x 6 2002, cath 2011 patent grafts, medical Rx 04/06/2011  . Diabetes mellitus with chronic kidney disease 04/06/2011  . Dyslipidemia 04/06/2011     Filed Vitals:   03/01/13 1228  BP: 168/108  Pulse: 71  Temp: 99.3 F (37.4 C)  TempSrc: Oral  Resp: 16  Height: 5' 10.5" (1.791 m)  Weight: 244 lb (110.678 kg)  SpO2: 94%     Exam   Awake Alert, Oriented X 3, No new F.N deficits, Normal affect Carbon.AT,PERRAL Supple Neck,No  JVD, No cervical lymphadenopathy appriciated.  Symmetrical Chest wall movement, Good air movement bilaterally, CTAB RRR,No Gallops,Rubs or new Murmurs, No Parasternal Heave +ve B.Sounds, Abd Soft, Non tender, No organomegaly appriciated, No rebound - guarding or rigidity. No Cyanosis, Clubbing or edema, No new Rash or bruise    No joint effusions   Data Review   Lab Results  Component Value Date   WBC 6.6 12/02/2012   HGB 11.6* 12/02/2012   HCT 32.2* 12/02/2012   MCV 89.7 12/02/2012   PLT 237 12/02/2012      Chemistry      Component Value Date/Time   NA 132* 12/02/2012 0500   K 4.9 12/02/2012 0500   CL 97 12/02/2012 0500   CO2 24 12/02/2012 0500   BUN 20 12/02/2012 0500   CREATININE 1.44* 12/02/2012 0500   CREATININE 1.64* 11/25/2012 1202      Component Value Date/Time   CALCIUM 9.6 12/02/2012 0500   ALKPHOS 92 07/13/2012 1852   AST 23 07/13/2012 1852   ALT 21 07/13/2012 1852   BILITOT 0.3 07/13/2012 1852       Lab Results  Component Value Date   HGBA1C 8.0* 11/30/2012    Lab Results  Component Value Date   CHOL 238* 01/06/2011   HDL 33* 01/06/2011   LDLCALC UNABLE TO CALCULATE IF TRIGLYCERIDE OVER 400 mg/dL 01/06/2011   TRIG 403* 01/06/2011   CHOLHDL 7.2 01/06/2011    Lab Results  Component Value Date  TSH 1.783 11/25/2012    No results found for this basename: PSA      Prior to Admission medications   Medication Sig Start Date End Date Taking? Authorizing Provider  albuterol (PROVENTIL HFA;VENTOLIN HFA) 108 (90 BASE) MCG/ACT inhaler Inhale 2 puffs into the lungs every 6 (six) hours as needed. For shortness of breath      Historical Provider, MD  amLODipine (NORVASC) 10 MG tablet Take 10 mg by mouth daily.      Historical Provider, MD  aspirin 81 MG tablet Take 81 mg by mouth daily.      Historical Provider, MD  atorvastatin (LIPITOR) 40 MG tablet Take 1 tablet (40 mg total) by mouth daily. 12/30/12   Mihai Croitoru, MD  clonazePAM (KLONOPIN) 1 MG tablet Take 1 mg by  mouth 2 (two) times daily as needed for anxiety.    Historical Provider, MD  cloNIDine (CATAPRES) 0.2 MG tablet Take 0.2 mg by mouth daily.     Historical Provider, MD  clopidogrel (PLAVIX) 75 MG tablet Take 1 tablet (75 mg total) by mouth daily. 01/22/13   Lorretta Harp, MD  fluticasone (FLONASE) 50 MCG/ACT nasal spray Place 1 spray into the nose daily as needed for rhinitis or allergies. 02/01/13   Angelica Chessman, MD  furosemide (LASIX) 20 MG tablet Take 1 tablet (20 mg total) by mouth daily. 02/01/13   Angelica Chessman, MD  gabapentin (NEURONTIN) 400 MG capsule Take 400 mg by mouth daily.      Historical Provider, MD  glimepiride (AMARYL) 4 MG tablet Take 4 mg by mouth 2 (two) times daily.      Historical Provider, MD  Insulin Aspart Prot & Aspart (NOVOLOG MIX 70/30 FLEXPEN) (70-30) 100 UNIT/ML SUPN Inject 75 Units into the skin 2 (two) times daily before a meal. Substitute with a pen. Dispense required syringes and needles one month supply 1 refill 03/01/13   Thurnell Lose, MD  isosorbide dinitrate (ISORDIL) 20 MG tablet Take 20 mg by mouth daily.    Historical Provider, MD  levothyroxine (SYNTHROID, LEVOTHROID) 25 MCG tablet Take 25 mcg by mouth daily before breakfast.    Historical Provider, MD  linagliptin (TRADJENTA) 5 MG TABS tablet Take 5 mg by mouth daily.    Historical Provider, MD  Menthol-Methyl Salicylate (BEN GAY GREASELESS) 10-15 % greaseless cream Apply 1 application topically daily as needed for pain (for foot).    Historical Provider, MD  metoprolol (TOPROL-XL) 200 MG 24 hr tablet Take 100 mg by mouth daily.     Historical Provider, MD  nitroGLYCERIN (NITROSTAT) 0.4 MG SL tablet Place 0.4 mg under the tongue every 5 (five) minutes as needed. For chest      Historical Provider, MD  ranitidine (ZANTAC) 75 MG tablet Take 75 mg by mouth daily as needed for heartburn.    Historical Provider, MD  traMADol (ULTRAM) 50 MG tablet Take 1 tablet (50 mg total) by mouth every 8  (eight) hours as needed. For pain/Maximum dose= 8 tablets per day 03/01/13   Thurnell Lose, MD     Assessment & Plan    Type 2 diabetes mellitus. Poor glycemic control. Increased insulin 7030-70 units twice a day from 60 units twice a day, repeat A1c in 3 months. Requested to do q. a.c. at bedtime Accu-Cheks maintain a log book and bring it next visit P.   Diffuse joint pains and aches mostly in the left hip and right hand. Obtain x-rays, check rheumatoid factor, anti-CCP antibody,  uric acid level. Placed on Ultram. Rheumatology followup.   Hypertension- pressure in poor control, increase Catapres to 0.2 mg 3 times a day.    Routine health maintenance.  Referred to GI for colonoscopy  OB referral made for mammogram and Pap smear   Thurnell Lose M.D on 03/01/2013 at 12:33 PM    Patient was given clear instructions to go to ER or return to the clinic if symptoms don't improve, worsen or new problems develop. Patient verbalized understanding. Patient was told to call to get lab results if hasn't heard anything in the next week.

## 2013-03-01 NOTE — Patient Instructions (Signed)
Accuchecks 4 times/day, Once in AM empty stomach and then before each meal. Log in all results and show them to your Prim.MD in 14 days. If any glucose reading is under 80 or above 300 call your Prim MD immidiately. Follow Low glucose instructions for glucose under 80 as instructed.

## 2013-03-02 ENCOUNTER — Telehealth: Payer: Self-pay | Admitting: Emergency Medicine

## 2013-03-02 ENCOUNTER — Encounter: Payer: Self-pay | Admitting: Cardiovascular Disease

## 2013-03-02 LAB — CYCLIC CITRUL PEPTIDE ANTIBODY, IGG: Cyclic Citrullin Peptide Ab: <2 U/mL (ref 0.0–5.0)

## 2013-03-02 MED ORDER — COLCHICINE 0.6 MG PO TABS: 0.6000 mg | ORAL_TABLET | Freq: Every day | ORAL | Status: DC

## 2013-03-02 NOTE — Telephone Encounter (Signed)
Message copied by Ricci Barker on Tue Mar 02, 2013  6:23 PM ------      Message from: [facility]      Created: Tue Mar 02, 2013  1:44 PM       Please let patient know colchicine called into her pharmacy , might have mild gout/rheumatoid arthritis, Rhematology referral made ------

## 2013-03-02 NOTE — Progress Notes (Signed)
Quick Note:  Please let patient know colchicine called into her pharmacy , might have mild gout/rheumatoid arthritis, Rhematology referral made ______

## 2013-03-04 ENCOUNTER — Telehealth: Payer: Self-pay | Admitting: Emergency Medicine

## 2013-03-04 NOTE — Progress Notes (Signed)
Left message with results and script pick up

## 2013-03-04 NOTE — Telephone Encounter (Signed)
Message copied by Ricci Barker on Thu Mar 04, 2013  2:43 PM ------      Message from: [facility]      Created: Tue Mar 02, 2013  1:44 PM       Please let patient know colchicine called into her pharmacy , might have mild gout/rheumatoid arthritis, Rhematology referral made ------

## 2013-03-16 ENCOUNTER — Other Ambulatory Visit: Payer: Self-pay | Admitting: Internal Medicine

## 2013-03-18 ENCOUNTER — Telehealth: Payer: Self-pay | Admitting: Emergency Medicine

## 2013-03-22 ENCOUNTER — Ambulatory Visit: Payer: Medicare Other | Attending: Internal Medicine | Admitting: Internal Medicine

## 2013-03-22 DIAGNOSIS — M25559 Pain in unspecified hip: Secondary | ICD-10-CM

## 2013-03-22 DIAGNOSIS — M2550 Pain in unspecified joint: Secondary | ICD-10-CM | POA: Diagnosis not present

## 2013-03-22 DIAGNOSIS — Z9109 Other allergy status, other than to drugs and biological substances: Secondary | ICD-10-CM

## 2013-03-22 DIAGNOSIS — Z09 Encounter for follow-up examination after completed treatment for conditions other than malignant neoplasm: Secondary | ICD-10-CM

## 2013-03-22 DIAGNOSIS — M25552 Pain in left hip: Secondary | ICD-10-CM

## 2013-03-22 MED ORDER — NAPROXEN 500 MG PO TABS: 500.0000 mg | ORAL_TABLET | Freq: Two times a day (BID) | ORAL | Status: DC

## 2013-03-22 MED ORDER — LORATADINE 10 MG PO TABS: 10.0000 mg | ORAL_TABLET | Freq: Every day | ORAL | Status: DC

## 2013-03-22 NOTE — Progress Notes (Signed)
MRN: [id number] Name: Brooke Stafford  Sex: female Age: 57 y.o. DOB: 03-Jan-1956  Allergies: Penicillins cross reactors and Adhesive  No chief complaint on file.   HPI: Patient is 57 y.o. female who comes today for followup, she was seen by Dr. Candiss Norse complain of joint pain and left hip pain, patient had x-rays done left hip x-rays negative right hand x-ray positive for osteoarthritis, blood work consistent with rheumatoid factor positive, patient is already referred to rheumatology, she's requesting something medication, also she has lot of environmental allergies itchy eyes..  Past Medical History  Diagnosis Date  . Asthma   . Anxiety   . GERD (gastroesophageal reflux disease)   . Coronary artery disease 2002    CABG x 6. Cath 5/11- med Rx  . Hypertension   . Peripheral vascular disease 12/12    LSFA PTA  . Anemia   . Hyperlipidemia   . Chronic renal insufficiency, stage II (mild)     followed  by [facility]  . CAD (coronary artery disease) 2002    CABG x 6 2002, cath 2011- med Rx  . Hypothyroid     treated  . Obesity (BMI 35.0-39.9 without comorbidity)   . CHF (congestive heart failure)     "in 2002" (11/26/2012)  . Myocardial infarction 2000; 2002; 2011?  [name] Chronic bronchitis     "q year; in the winter" (11/30/2012)  . Pneumonia     "3 times I think" (11/30/2012)  . Type II diabetes mellitus   . Migraines     "couple times/year" (11/30/2012)  . Arthritis     "stiff fingers and knees" (11/30/2012)  . Depression     Past Surgical History  Procedure Laterality Date  . Cholecystectomy  1982  . Cesarean section  1978; 1980  . Tubal ligation  1980  . Breast cyst excision Right 1970's  . Coronary artery bypass graft  11/20/2000    x6 LIMA to distal LAD, svg to first diag, svg to ramus intermediate branch and swquential SVG to cir marginal branch, SVG to posterior descending coronary and sequential SVG to first right posterolateral branch  . Cardiac catheterization  2002   . Coronary angioplasty with stent placement  2004; 2012    "I have 2 stents" 11/30/2012)  . Peripheral arterial stent graft Left     SFA/notes 04/07/2011 (11/30/2012)  . Nm myocar perf wall motion  08/27/2004    negative      Medication List       This list is accurate as of: 03/22/13 12:38 PM.  Always use your most recent med list.               albuterol 108 (90 BASE) MCG/ACT inhaler  Commonly known as:  PROVENTIL HFA;VENTOLIN HFA  - Inhale 2 puffs into the lungs every 6 (six) hours as needed. For shortness of breath  -      amLODipine 10 MG tablet  Commonly known as:  NORVASC  Take 10 mg by mouth daily.     aspirin 81 MG tablet  Take 81 mg by mouth daily.     atorvastatin 40 MG tablet  Commonly known as:  LIPITOR  Take 1 tablet (40 mg total) by mouth daily.     BEN GAY GREASELESS 10-15 % greaseless cream  Apply 1 application topically daily as needed for pain (for foot).     clonazePAM 1 MG tablet  Commonly known as:  KLONOPIN  Take 1 mg by mouth 2 (  two) times daily as needed for anxiety.     cloNIDine 0.2 MG tablet  Commonly known as:  CATAPRES  Take 1 tablet (0.2 mg total) by mouth 3 (three) times daily.     clopidogrel 75 MG tablet  Commonly known as:  PLAVIX  Take 1 tablet (75 mg total) by mouth daily.     colchicine 0.6 MG tablet  Take 1 tablet (0.6 mg total) by mouth daily. Take BID for 4 days then daily for 10 more days     fluticasone 50 MCG/ACT nasal spray  Commonly known as:  FLONASE  Place 1 spray into the nose daily as needed for rhinitis or allergies.     furosemide 20 MG tablet  Commonly known as:  LASIX  Take 1 tablet (20 mg total) by mouth daily.     gabapentin 400 MG capsule  Commonly known as:  NEURONTIN  Take 400 mg by mouth daily.     glimepiride 4 MG tablet  Commonly known as:  AMARYL  Take 4 mg by mouth 2 (two) times daily.     Insulin Aspart Prot & Aspart (70-30) 100 UNIT/ML Supn  Commonly known as:  NOVOLOG MIX 70/30  FLEXPEN  Inject 70 Units into the skin 2 (two) times daily before a meal. Substitute with a pen. Dispense required syringes and needles one month supply 1 refill     isosorbide dinitrate 20 MG tablet  Commonly known as:  ISORDIL  Take 20 mg by mouth daily.     levothyroxine 25 MCG tablet  Commonly known as:  SYNTHROID, LEVOTHROID  Take 25 mcg by mouth daily before breakfast.     loratadine 10 MG tablet  Commonly known as:  CLARITIN  Take 1 tablet (10 mg total) by mouth daily.     metoprolol 200 MG 24 hr tablet  Commonly known as:  TOPROL-XL  Take 100 mg by mouth daily.     naproxen 500 MG tablet  Commonly known as:  NAPROSYN  Take 1 tablet (500 mg total) by mouth 2 (two) times daily with a meal.     nitroGLYCERIN 0.4 MG SL tablet  Commonly known as:  NITROSTAT  - Place 0.4 mg under the tongue every 5 (five) minutes as needed. For chest  -      ranitidine 75 MG tablet  Commonly known as:  ZANTAC  Take 75 mg by mouth daily as needed for heartburn.     TRADJENTA 5 MG Tabs tablet  Generic drug:  linagliptin  Take 5 mg by mouth daily.     traMADol 50 MG tablet  Commonly known as:  ULTRAM  Take 1 tablet (50 mg total) by mouth every 8 (eight) hours as needed. For pain/Maximum dose= 8 tablets per day        Meds ordered this encounter  Medications  . naproxen (NAPROSYN) 500 MG tablet    Sig: Take 1 tablet (500 mg total) by mouth 2 (two) times daily with a meal.    Dispense:  30 tablet    Refill:  2  . loratadine (CLARITIN) 10 MG tablet    Sig: Take 1 tablet (10 mg total) by mouth daily.    Dispense:  30 tablet    Refill:  0    Immunization History  Administered Date(s) Administered  . Pneumococcal-Unspecified 12/14/2009    History  Substance Use Topics  . Smoking status: Former Smoker -- 1.00 packs/day for 25 years    Types: Cigarettes  Quit date: 04/04/2000  . Smokeless tobacco: Never Used  . Alcohol Use: Yes     Comment: 11/30/2012 "have a glass of red  wine on my birthday q yr; that's it"    Review of Systems  As noted in HPI  There were no vitals filed for this visit.  Physical Exam  Physical Exam  Constitutional: No distress.  Cardiovascular: Normal rate and regular rhythm.   Pulmonary/Chest: Breath sounds normal. No respiratory distress. She has no wheezes. She has no rales.  Musculoskeletal:  Right hand some tenderness with palpation on interphalangeal joints   Left hip minimal tenderness laterally     CBC    Component Value Date/Time   WBC 6.6 12/02/2012 0500   RBC 3.59* 12/02/2012 0500   HGB 11.6* 12/02/2012 0500   HCT 32.2* 12/02/2012 0500   PLT 237 12/02/2012 0500   MCV 89.7 12/02/2012 0500   LYMPHSABS 2.0 07/13/2012 1852   MONOABS 0.2 07/13/2012 1852   EOSABS 0.4 07/13/2012 1852   BASOSABS 0.0 07/13/2012 1852    CMP     Component Value Date/Time   NA 132* 12/02/2012 0500   K 4.9 12/02/2012 0500   CL 97 12/02/2012 0500   CO2 24 12/02/2012 0500   GLUCOSE 411* 12/02/2012 0500   BUN 20 12/02/2012 0500   CREATININE 1.44* 12/02/2012 0500   CREATININE 1.64* 11/25/2012 1202   CALCIUM 9.6 12/02/2012 0500   PROT 9.0* 07/13/2012 1852   ALBUMIN 4.2 07/13/2012 1852   AST 23 07/13/2012 1852   ALT 21 07/13/2012 1852   ALKPHOS 92 07/13/2012 1852   BILITOT 0.3 07/13/2012 1852   GFRNONAA 39* 12/02/2012 0500   GFRAA 45* 12/02/2012 0500    Lab Results  Component Value Date/Time   CHOL 238* 01/06/2011  1:14 AM    No components found with this basename: hga1c    Lab Results  Component Value Date/Time   AST 23 07/13/2012  6:52 PM    Assessment and Plan  Follow up  Joint pain - likely RF+ Plan: naproxen (NAPROSYN) 500 MG tablet bid for pain, pt is already referred to Rheumatology    Left hip pain .... Xray negative for any fracture or dislocation   Environmental allergies - Plan: loratadine (CLARITIN) 10 MG tablet    Return in about 3 months (around 06/20/2013).  Lorayne Marek, MD

## 2013-03-30 ENCOUNTER — Ambulatory Visit: Payer: Medicare Other | Admitting: Cardiovascular Disease

## 2013-05-05 ENCOUNTER — Ambulatory Visit: Payer: Medicare Other | Admitting: Cardiovascular Disease

## 2013-05-10 ENCOUNTER — Telehealth (HOSPITAL_COMMUNITY): Payer: Self-pay | Admitting: *Deleted

## 2013-05-11 ENCOUNTER — Other Ambulatory Visit: Payer: Self-pay | Admitting: Emergency Medicine

## 2013-05-11 ENCOUNTER — Telehealth: Payer: Self-pay | Admitting: Emergency Medicine

## 2013-05-11 ENCOUNTER — Telehealth: Payer: Self-pay | Admitting: Internal Medicine

## 2013-05-11 MED ORDER — LEVOTHYROXINE SODIUM 25 MCG PO TABS: 25.0000 ug | ORAL_TABLET | Freq: Every day | ORAL | Status: DC

## 2013-05-11 MED ORDER — INSULIN ASPART PROT & ASPART (70-30 MIX) 100 UNIT/ML PEN: 70.0000 [IU] | PEN_INJECTOR | Freq: Two times a day (BID) | SUBCUTANEOUS | Status: DC

## 2013-05-11 MED ORDER — GABAPENTIN 400 MG PO CAPS: 400.0000 mg | ORAL_CAPSULE | Freq: Every day | ORAL | Status: DC

## 2013-05-11 MED ORDER — LINAGLIPTIN 5 MG PO TABS: 5.0000 mg | ORAL_TABLET | Freq: Every day | ORAL | Status: DC

## 2013-05-11 MED ORDER — GLIMEPIRIDE 4 MG PO TABS: 4.0000 mg | ORAL_TABLET | Freq: Two times a day (BID) | ORAL | Status: DC

## 2013-05-11 NOTE — Telephone Encounter (Signed)
Pt called regarding a refill of her medications, pt has an appointment on 06/02/2013 and would like enough refill to last her until her appointment visit, please contact pt

## 2013-05-11 NOTE — Telephone Encounter (Signed)
Left message for pt to call clinic

## 2013-05-11 NOTE — Telephone Encounter (Signed)
Pt called in requesting medication refills on BP and Diabetes,thyroid meds. Refilled x 30 dys until seen by provider

## 2013-05-31 ENCOUNTER — Telehealth (HOSPITAL_COMMUNITY): Payer: Self-pay | Admitting: *Deleted

## 2013-06-02 ENCOUNTER — Encounter: Payer: Self-pay | Admitting: Internal Medicine

## 2013-06-02 ENCOUNTER — Other Ambulatory Visit: Payer: Self-pay

## 2013-06-02 ENCOUNTER — Ambulatory Visit: Payer: Medicare Other | Attending: Internal Medicine | Admitting: Internal Medicine

## 2013-06-02 VITALS — BP 164/85 | HR 85 | Temp 98.9°F | Resp 14 | Ht 70.0 in | Wt 244.6 lb

## 2013-06-02 DIAGNOSIS — Z9109 Other allergy status, other than to drugs and biological substances: Secondary | ICD-10-CM

## 2013-06-02 DIAGNOSIS — Z951 Presence of aortocoronary bypass graft: Secondary | ICD-10-CM | POA: Diagnosis not present

## 2013-06-02 DIAGNOSIS — N189 Chronic kidney disease, unspecified: Secondary | ICD-10-CM

## 2013-06-02 DIAGNOSIS — I2510 Atherosclerotic heart disease of native coronary artery without angina pectoris: Secondary | ICD-10-CM

## 2013-06-02 DIAGNOSIS — E785 Hyperlipidemia, unspecified: Secondary | ICD-10-CM | POA: Diagnosis not present

## 2013-06-02 DIAGNOSIS — Z794 Long term (current) use of insulin: Secondary | ICD-10-CM | POA: Insufficient documentation

## 2013-06-02 DIAGNOSIS — I129 Hypertensive chronic kidney disease with stage 1 through stage 4 chronic kidney disease, or unspecified chronic kidney disease: Secondary | ICD-10-CM | POA: Insufficient documentation

## 2013-06-02 DIAGNOSIS — E1129 Type 2 diabetes mellitus with other diabetic kidney complication: Secondary | ICD-10-CM | POA: Insufficient documentation

## 2013-06-02 DIAGNOSIS — N184 Chronic kidney disease, stage 4 (severe): Secondary | ICD-10-CM | POA: Insufficient documentation

## 2013-06-02 DIAGNOSIS — E669 Obesity, unspecified: Secondary | ICD-10-CM | POA: Diagnosis not present

## 2013-06-02 DIAGNOSIS — E1122 Type 2 diabetes mellitus with diabetic chronic kidney disease: Secondary | ICD-10-CM

## 2013-06-02 DIAGNOSIS — I10 Essential (primary) hypertension: Secondary | ICD-10-CM | POA: Diagnosis not present

## 2013-06-02 LAB — CBC WITH DIFFERENTIAL/PLATELET
BASOS ABS: 0 10*3/uL (ref 0.0–0.1)
BASOS PCT: 0 % (ref 0–1)
Eosinophils Absolute: 0.3 10*3/uL (ref 0.0–0.7)
Eosinophils Relative: 4 % (ref 0–5)
HCT: 40.7 % (ref 36.0–46.0)
HEMOGLOBIN: 13.5 g/dL (ref 12.0–15.0)
Lymphocytes Relative: 33 % (ref 12–46)
Lymphs Abs: 2.4 10*3/uL (ref 0.7–4.0)
MCH: 31.4 pg (ref 26.0–34.0)
MCHC: 33.2 g/dL (ref 30.0–36.0)
MCV: 94.7 fL (ref 78.0–100.0)
MONOS PCT: 5 % (ref 3–12)
Monocytes Absolute: 0.4 10*3/uL (ref 0.1–1.0)
NEUTROS PCT: 58 % (ref 43–77)
Neutro Abs: 4.2 10*3/uL (ref 1.7–7.7)
Platelets: 220 10*3/uL (ref 150–400)
RBC: 4.3 MIL/uL (ref 3.87–5.11)
RDW: 14.7 % (ref 11.5–15.5)
WBC: 7.2 10*3/uL (ref 4.0–10.5)

## 2013-06-02 LAB — COMPLETE METABOLIC PANEL WITH GFR
ALBUMIN: 4.1 g/dL (ref 3.5–5.2)
ALT: 29 U/L (ref 0–35)
AST: 14 U/L (ref 0–37)
Alkaline Phosphatase: 100 U/L (ref 39–117)
BUN: 31 mg/dL — ABNORMAL HIGH (ref 6–23)
CALCIUM: 10.5 mg/dL (ref 8.4–10.5)
CO2: 25 mEq/L (ref 19–32)
Chloride: 102 mEq/L (ref 96–112)
Creat: 1.67 mg/dL — ABNORMAL HIGH (ref 0.50–1.10)
GFR, EST AFRICAN AMERICAN: 39 mL/min — AB
GFR, Est Non African American: 33 mL/min — ABNORMAL LOW
Glucose, Bld: 345 mg/dL — ABNORMAL HIGH (ref 70–99)
POTASSIUM: 5.1 meq/L (ref 3.5–5.3)
SODIUM: 134 meq/L — AB (ref 135–145)
TOTAL PROTEIN: 7.3 g/dL (ref 6.0–8.3)
Total Bilirubin: 0.5 mg/dL (ref 0.2–1.2)

## 2013-06-02 LAB — POCT GLYCOSYLATED HEMOGLOBIN (HGB A1C): Hemoglobin A1C: 10.2

## 2013-06-02 LAB — POCT URINALYSIS DIPSTICK
Bilirubin, UA: NEGATIVE
Glucose, UA: 250
KETONES UA: NEGATIVE
LEUKOCYTES UA: NEGATIVE
Nitrite, UA: NEGATIVE
Spec Grav, UA: >=1.03
UROBILINOGEN UA: 0.2
pH, UA: 5

## 2013-06-02 LAB — LIPID PANEL
CHOL/HDL RATIO: 4.4 ratio
Cholesterol: 209 mg/dL — ABNORMAL HIGH (ref 0–200)
HDL: 48 mg/dL (ref >39)
LDL Cholesterol: 127 mg/dL — ABNORMAL HIGH (ref 0–99)
Triglycerides: 169 mg/dL — ABNORMAL HIGH (ref <150)
VLDL: 34 mg/dL (ref 0–40)

## 2013-06-02 LAB — GLUCOSE, POCT (MANUAL RESULT ENTRY): POC GLUCOSE: 306 mg/dL — AB (ref 70–99)

## 2013-06-02 LAB — URIC ACID: Uric Acid, Serum: 5.7 mg/dL (ref 2.4–7.0)

## 2013-06-02 MED ORDER — ALBUTEROL SULFATE HFA 108 (90 BASE) MCG/ACT IN AERS: 2.0000 | INHALATION_SPRAY | Freq: Four times a day (QID) | RESPIRATORY_TRACT | Status: DC | PRN

## 2013-06-02 MED ORDER — LINAGLIPTIN 5 MG PO TABS: 5.0000 mg | ORAL_TABLET | Freq: Every day | ORAL | Status: DC

## 2013-06-02 MED ORDER — INSULIN ASPART PROT & ASPART (70-30 MIX) 100 UNIT/ML PEN: 70.0000 [IU] | PEN_INJECTOR | Freq: Two times a day (BID) | SUBCUTANEOUS | Status: DC

## 2013-06-02 MED ORDER — POLYETHYLENE GLYCOL 3350 17 GM/SCOOP PO POWD: 17.0000 g | Freq: Two times a day (BID) | ORAL | Status: DC | PRN

## 2013-06-02 MED ORDER — ISOSORBIDE DINITRATE 20 MG PO TABS: 20.0000 mg | ORAL_TABLET | Freq: Every day | ORAL | Status: DC

## 2013-06-02 MED ORDER — CLONIDINE HCL 0.2 MG PO TABS: 0.2000 mg | ORAL_TABLET | Freq: Three times a day (TID) | ORAL | Status: DC

## 2013-06-02 MED ORDER — TRAMADOL HCL 50 MG PO TABS: 50.0000 mg | ORAL_TABLET | Freq: Three times a day (TID) | ORAL | Status: DC | PRN

## 2013-06-02 MED ORDER — ALLOPURINOL 150 MG HALF TABLET: 150.0000 mg | ORAL_TABLET | Freq: Every day | ORAL | Status: DC

## 2013-06-02 MED ORDER — ATORVASTATIN CALCIUM 40 MG PO TABS: 40.0000 mg | ORAL_TABLET | Freq: Every day | ORAL | Status: DC

## 2013-06-02 MED ORDER — LORATADINE 10 MG PO TABS: 10.0000 mg | ORAL_TABLET | Freq: Every day | ORAL | Status: DC

## 2013-06-02 MED ORDER — AMLODIPINE BESYLATE 10 MG PO TABS: 10.0000 mg | ORAL_TABLET | Freq: Every day | ORAL | Status: DC

## 2013-06-02 MED ORDER — METOPROLOL SUCCINATE ER 200 MG PO TB24: 100.0000 mg | ORAL_TABLET | Freq: Every day | ORAL | Status: DC

## 2013-06-02 MED ORDER — GABAPENTIN 400 MG PO CAPS: 400.0000 mg | ORAL_CAPSULE | Freq: Every day | ORAL | Status: DC

## 2013-06-02 MED ORDER — FUROSEMIDE 20 MG PO TABS: 20.0000 mg | ORAL_TABLET | Freq: Every day | ORAL | Status: DC

## 2013-06-02 MED ORDER — FLUTICASONE PROPIONATE 50 MCG/ACT NA SUSP: 1.0000 | Freq: Every day | NASAL | Status: DC | PRN

## 2013-06-02 MED ORDER — CLOPIDOGREL BISULFATE 75 MG PO TABS: 75.0000 mg | ORAL_TABLET | Freq: Every day | ORAL | Status: DC

## 2013-06-02 NOTE — Progress Notes (Signed)
Patient is here for a medication refill. Has some questions about her insulin and dosage. Has a list of medications for refills.

## 2013-06-02 NOTE — Progress Notes (Signed)
Patient ID: Brooke Stafford, female   DOB: 1955/11/02, 58 y.o.   MRN: [id number]   CC:  HPI:  57 year old female comes in for refill on her prescription she is a history of anxiety, had asthma, hypertension, gout. Today she complains of unable to get a full 3 ML while off her insulin. She supposed to be using 70 units twice a day of NPH 70/30, she has only been using 40 units twice a day. Her CBG is 306. A1c is pending. Patient had an ophthalmology exam 2 years ago. Blood pressure is elevated in the 160s. Patient complains of chills and sore throat for the last 2 weeks. No cough     Allergies  Allergen Reactions  . Penicillins Cross Reactors Hives    And high fever  . Adhesive [Tape] Rash    bruising   Past Medical History  Diagnosis Date  . Asthma   . Anxiety   . GERD (gastroesophageal reflux disease)   . Coronary artery disease 2002    CABG x 6. Cath 5/11- med Rx  . Hypertension   . Peripheral vascular disease 12/12    LSFA PTA  . Anemia   . Hyperlipidemia   . Chronic renal insufficiency, stage II (mild)     followed  by [facility]  . CAD (coronary artery disease) 2002    CABG x 6 2002, cath 2011- med Rx  . Hypothyroid     treated  . Obesity (BMI 35.0-39.9 without comorbidity)   . CHF (congestive heart failure)     "in 2002" (11/26/2012)  . Myocardial infarction 2000; 2002; 2011?  [name] Chronic bronchitis     "q year; in the winter" (11/30/2012)  . Pneumonia     "3 times I think" (11/30/2012)  . Type II diabetes mellitus   . Migraines     "couple times/year" (11/30/2012)  . Arthritis     "stiff fingers and knees" (11/30/2012)  . Depression    Current Outpatient Prescriptions on File Prior to Visit  Medication Sig Dispense Refill  . aspirin 81 MG tablet Take 81 mg by mouth daily.        . clonazePAM (KLONOPIN) 1 MG tablet Take 1 mg by mouth 2 (two) times daily as needed for anxiety.      [name] glimepiride (AMARYL) 4 MG tablet Take 1 tablet (4 mg total) by mouth 2 (two)  times daily.  30 tablet  2  . levothyroxine (SYNTHROID, LEVOTHROID) 25 MCG tablet Take 1 tablet (25 mcg total) by mouth daily before breakfast.  30 tablet  1  . Menthol-Methyl Salicylate (BEN GAY GREASELESS) 10-15 % greaseless cream Apply 1 application topically daily as needed for pain (for foot).      . naproxen (NAPROSYN) 500 MG tablet Take 1 tablet (500 mg total) by mouth 2 (two) times daily with a meal.  30 tablet  2  . nitroGLYCERIN (NITROSTAT) 0.4 MG SL tablet Place 0.4 mg under the tongue every 5 (five) minutes as needed. For chest        . ranitidine (ZANTAC) 75 MG tablet Take 75 mg by mouth daily as needed for heartburn.       No current facility-administered medications on file prior to visit.   Family History  Problem Relation Age of Onset  . Diabetes Mother   . Hypertension Mother   . Stroke Mother   . Hypertension Father   . Hypertension Brother   . Hypertension Sister   . Diabetes Sister   .  Hyperlipidemia Sister    History   Social History  . Marital Status: Divorced    Spouse Name: N/A    Number of Children: N/A  . Years of Education: N/A   Occupational History  . Not on file.   Social History Main Topics  . Smoking status: Former Smoker -- 1.00 packs/day for 25 years    Types: Cigarettes    Quit date: 04/04/2000  . Smokeless tobacco: Never Used  . Alcohol Use: Yes     Comment: 11/30/2012 "have a glass of red wine on my birthday q yr; that's it"  . Drug Use: No  . Sexual Activity: Not Currently    Birth Control/ Protection: Abstinence   Other Topics Concern  . Not on file   Social History Narrative  . No narrative on file    Review of Systems  Constitutional: As in history of present illness HENT: Negative for ear pain, nosebleeds, congestion, facial swelling, rhinorrhea, neck pain, neck stiffness and ear discharge.   Eyes: Negative for pain, discharge, redness, itching and visual disturbance.  Respiratory: Negative for cough, choking, chest  tightness, shortness of breath, wheezing and stridor.   Cardiovascular: Negative for chest pain, palpitations and leg swelling.  Gastrointestinal: Negative for abdominal distention.  Genitourinary: Negative for dysuria, urgency, frequency, hematuria, flank pain, decreased urine volume, difficulty urinating and dyspareunia.  Musculoskeletal: Negative for back pain, joint swelling, arthralgias and gait problem.  Neurological: Negative for dizziness, tremors, seizures, syncope, facial asymmetry, speech difficulty, weakness, light-headedness, numbness and headaches.  Hematological: Negative for adenopathy. Does not bruise/bleed easily.  Psychiatric/Behavioral: Negative for hallucinations, behavioral problems, confusion, dysphoric mood, decreased concentration and agitation.    Objective:   Filed Vitals:   06/02/13 1450  BP: 164/85  Pulse: 85  Temp: 98.9 F (37.2 C)  Resp: 14    Physical Exam  Constitutional: Appears well-developed and well-nourished. No distress.  HENT: Normocephalic. External right and left ear normal. Oropharynx is clear and moist.  Eyes: Conjunctivae and EOM are normal. PERRLA, no scleral icterus.  Neck: Normal ROM. Neck supple. No JVD. No tracheal deviation. No thyromegaly.  CVS: RRR, S1/S2 +, no murmurs, no gallops, no carotid bruit.  Pulmonary: Effort and breath sounds normal, no stridor, rhonchi, wheezes, rales.  Abdominal: Soft. BS +,  no distension, tenderness, rebound or guarding.  Musculoskeletal: Normal range of motion. No edema and no tenderness.  Lymphadenopathy: No lymphadenopathy noted, cervical, inguinal. Neuro: Alert. Normal reflexes, muscle tone coordination. No cranial nerve deficit. Skin: Skin is warm and dry. No rash noted. Not diaphoretic. No erythema. No pallor.  Psychiatric: Normal mood and affect. Behavior, judgment, thought content normal.   Lab Results  Component Value Date   WBC 6.6 12/02/2012   HGB 11.6* 12/02/2012   HCT 32.2* 12/02/2012    MCV 89.7 12/02/2012   PLT 237 12/02/2012   Lab Results  Component Value Date   CREATININE 1.44* 12/02/2012   BUN 20 12/02/2012   NA 132* 12/02/2012   K 4.9 12/02/2012   CL 97 12/02/2012   CO2 24 12/02/2012    Lab Results  Component Value Date   HGBA1C 8.1 03/01/2013   Lipid Panel     Component Value Date/Time   CHOL 238* 01/06/2011 0114   TRIG 403* 01/06/2011 0114   HDL 33* 01/06/2011 0114   CHOLHDL 7.2 01/06/2011 0114   VLDL UNABLE TO CALCULATE IF TRIGLYCERIDE OVER 400 mg/dL 01/06/2011 0114   LDLCALC UNABLE TO CALCULATE IF TRIGLYCERIDE OVER 400 mg/dL 01/06/2011  0114       Assessment and plan:   Patient Active Problem List   Diagnosis Date Noted  . CKD 4, baseline creatinine around 1.4 Repeat renal panel    03/01/2013  . Atherosclerotic peripheral vascular disease Continue Plavix  02/01/2013  . Diabetes mellitus type 2 in obese Hemoglobin A1c has increased to 10.2 patient taking less than her prescribed dose of insulin. Recommended to take insulin 70/30,  70 units twice a day     02/01/2013  . CAD (coronary artery disease) 02/01/2013  . S/P CABG (coronary artery bypass graft) 02/01/2013  . Essential hypertension, benign Uncontrolled Refills provided for clonidine, metoprolol  02/01/2013  . Unspecified hypothyroidism 02/01/2013  . PVD, LSFA PTA 12/12 04/06/2011  . HTN (hypertension), malignant, patent renal arteries   04/06/2011  . Hx of CABG x 6 2002, cath 2011 patent grafts, medical Rx 04/06/2011  . Diabetes mellitus with chronic kidney disease 04/06/2011  . Dyslipidemia 04/06/2011    Gout Will repeat uric acid, start the patient on allopurinol  Patient to follow up in 3 months       The patient was given clear instructions to go to ER or return to medical center if symptoms don't improve, worsen or new problems develop. The patient verbalized understanding. The patient was told to call to get any lab results if not heard anything in the next week.

## 2013-06-03 LAB — T4, FREE: Free T4: 1.24 ng/dL (ref 0.80–1.80)

## 2013-06-03 LAB — TSH: TSH: 2.52 u[IU]/mL (ref 0.350–4.500)

## 2013-06-04 ENCOUNTER — Telehealth: Payer: Self-pay | Admitting: *Deleted

## 2013-06-04 NOTE — Telephone Encounter (Signed)
Left patient a voicemail to give us a call back .

## 2013-06-04 NOTE — Telephone Encounter (Signed)
Message copied by Aviendha Azbell, [place] R on Fri Jun 04, 2013 10:03 AM ------      Message from: Allyson Sabal MD, [facility]      Created: Thu Jun 03, 2013  9:36 AM       Notify patient of the patient's hemoglobin A1c has increased to 10.2. She was compliant with the recommended dose of insulin. If any problems call the clinic back. She must check her sugar 3 times a day and come back and see me or our  clinical pharmacist in 2 weeks ------

## 2013-06-07 ENCOUNTER — Ambulatory Visit: Payer: Medicare Other | Admitting: Home Health Services

## 2013-06-11 ENCOUNTER — Telehealth: Payer: Self-pay | Admitting: Emergency Medicine

## 2013-06-11 NOTE — Telephone Encounter (Signed)
Left message for pt to call clinic for lab results and medication adjustments

## 2013-06-11 NOTE — Telephone Encounter (Signed)
Message copied by Ricci Barker on Fri Jun 11, 2013  4:29 PM ------      Message from: Allyson Sabal MD, [facility]      Created: Thu Jun 03, 2013  9:36 AM       Notify patient of the patient's hemoglobin A1c has increased to 10.2. She was compliant with the recommended dose of insulin. If any problems call the clinic back. She must check her sugar 3 times a day and come back and see me or our  clinical pharmacist in 2 weeks ------

## 2013-06-15 ENCOUNTER — Ambulatory Visit: Payer: Medicare Other | Admitting: Cardiovascular Disease

## 2013-06-15 NOTE — Telephone Encounter (Signed)
informed

## 2013-06-21 ENCOUNTER — Ambulatory Visit: Payer: Medicare Other | Attending: Internal Medicine | Admitting: Internal Medicine

## 2013-06-21 ENCOUNTER — Ambulatory Visit: Payer: Medicare Other | Admitting: Internal Medicine

## 2013-06-21 ENCOUNTER — Encounter: Payer: Self-pay | Admitting: Internal Medicine

## 2013-06-21 VITALS — BP 155/84 | HR 71 | Temp 98.8°F | Resp 14 | Ht 70.0 in | Wt 244.6 lb

## 2013-06-21 DIAGNOSIS — E1122 Type 2 diabetes mellitus with diabetic chronic kidney disease: Secondary | ICD-10-CM

## 2013-06-21 DIAGNOSIS — N189 Chronic kidney disease, unspecified: Secondary | ICD-10-CM

## 2013-06-21 DIAGNOSIS — I2510 Atherosclerotic heart disease of native coronary artery without angina pectoris: Secondary | ICD-10-CM | POA: Diagnosis not present

## 2013-06-21 DIAGNOSIS — E1129 Type 2 diabetes mellitus with other diabetic kidney complication: Secondary | ICD-10-CM | POA: Diagnosis not present

## 2013-06-21 LAB — GLUCOSE, POCT (MANUAL RESULT ENTRY): POC Glucose: 233 mg/dl — AB (ref 70–99)

## 2013-06-21 MED ORDER — INSULIN ASPART PROT & ASPART (70-30 MIX) 100 UNIT/ML PEN: 70.0000 [IU] | PEN_INJECTOR | Freq: Two times a day (BID) | SUBCUTANEOUS | Status: DC

## 2013-06-21 MED ORDER — TRAZODONE HCL 50 MG PO TABS: 25.0000 mg | ORAL_TABLET | Freq: Every evening | ORAL | Status: DC | PRN

## 2013-06-21 NOTE — Progress Notes (Signed)
Patient ID: Brooke Stafford, female   DOB: [birth year], 58 y.o.   MRN: [id number]   CC:  HPI: 58 year old female with history of diabetes, here for a refill of her Klonopin to help her sleep. Patient's ABG today is 233. She states that she is taking 60- 70 units twice a day of her insulin. Patient last A1c is 10.2 on 06/02/13.  The patient does complain of having a URI, for the last couple of days. The patient denies any chest pain any shortness of breath.     Allergies  Allergen Reactions  . Penicillins Cross Reactors Hives    And high fever  . Adhesive [Tape] Rash    bruising   Past Medical History  Diagnosis Date  . Asthma   . Anxiety   . GERD (gastroesophageal reflux disease)   . Coronary artery disease 2002    CABG x 6. Cath 5/11- med Rx  . Hypertension   . Peripheral vascular disease 12/12    LSFA PTA  . Anemia   . Hyperlipidemia   . Chronic renal insufficiency, stage II (mild)     followed  by [facility]  . CAD (coronary artery disease) 2002    CABG x 6 2002, cath 2011- med Rx  . Hypothyroid     treated  . Obesity (BMI 35.0-39.9 without comorbidity)   . CHF (congestive heart failure)     "in 2002" (11/26/2012)  . Myocardial infarction 2000; 2002; 2011?  [name] Chronic bronchitis     "q year; in the winter" (11/30/2012)  . Pneumonia     "3 times I think" (11/30/2012)  . Type II diabetes mellitus   . Migraines     "couple times/year" (11/30/2012)  . Arthritis     "stiff fingers and knees" (11/30/2012)  . Depression    Current Outpatient Prescriptions on File Prior to Visit  Medication Sig Dispense Refill  . albuterol (PROVENTIL HFA;VENTOLIN HFA) 108 (90 BASE) MCG/ACT inhaler Inhale 2 puffs into the lungs every 6 (six) hours as needed. For shortness of breath  1 Inhaler  3  . allopurinol (ZYLOPRIM) 150 mg TABS tablet Take 0.5 tablets (150 mg total) by mouth daily.  60 tablet  6  . amLODipine (NORVASC) 10 MG tablet Take 1 tablet (10 mg total) by mouth daily.  30  tablet  2  . aspirin 81 MG tablet Take 81 mg by mouth daily.        [name] atorvastatin (LIPITOR) 40 MG tablet Take 1 tablet (40 mg total) by mouth daily.  90 tablet  3  . cloNIDine (CATAPRES) 0.2 MG tablet Take 1 tablet (0.2 mg total) by mouth 3 (three) times daily.  90 tablet  1  . clopidogrel (PLAVIX) 75 MG tablet Take 1 tablet (75 mg total) by mouth daily.  30 tablet  6  . fluticasone (FLONASE) 50 MCG/ACT nasal spray Place 1 spray into both nostrils daily as needed for rhinitis or allergies.  16 g  2  . furosemide (LASIX) 20 MG tablet Take 1 tablet (20 mg total) by mouth daily.  90 tablet  2  . gabapentin (NEURONTIN) 400 MG capsule Take 1 capsule (400 mg total) by mouth daily.  90 capsule  3  . glimepiride (AMARYL) 4 MG tablet Take 1 tablet (4 mg total) by mouth 2 (two) times daily.  30 tablet  2  . isosorbide dinitrate (ISORDIL) 20 MG tablet Take 1 tablet (20 mg total) by mouth daily.  60 tablet  2  .  levothyroxine (SYNTHROID, LEVOTHROID) 25 MCG tablet Take 1 tablet (25 mcg total) by mouth daily before breakfast.  30 tablet  1  . linagliptin (TRADJENTA) 5 MG TABS tablet Take 1 tablet (5 mg total) by mouth daily.  30 tablet  3  . loratadine (CLARITIN) 10 MG tablet Take 1 tablet (10 mg total) by mouth daily.  30 tablet  3  . Menthol-Methyl Salicylate (BEN GAY GREASELESS) 10-15 % greaseless cream Apply 1 application topically daily as needed for pain (for foot).      . metoprolol (TOPROL-XL) 200 MG 24 hr tablet Take 0.5 tablets (100 mg total) by mouth daily.  30 tablet  3  . naproxen (NAPROSYN) 500 MG tablet Take 1 tablet (500 mg total) by mouth 2 (two) times daily with a meal.  30 tablet  2  . nitroGLYCERIN (NITROSTAT) 0.4 MG SL tablet Place 0.4 mg under the tongue every 5 (five) minutes as needed. For chest        . polyethylene glycol powder (GLYCOLAX/MIRALAX) powder Take 17 g by mouth 2 (two) times daily as needed.  3350 g  3  . ranitidine (ZANTAC) 75 MG tablet Take 75 mg by mouth daily as needed  for heartburn.      . traMADol (ULTRAM) 50 MG tablet Take 1 tablet (50 mg total) by mouth every 8 (eight) hours as needed. For pain/Maximum dose= 8 tablets per day  30 tablet  3   No current facility-administered medications on file prior to visit.   Family History  Problem Relation Age of Onset  . Diabetes Mother   . Hypertension Mother   . Stroke Mother   . Hypertension Father   . Hypertension Brother   . Hypertension Sister   . Diabetes Sister   . Hyperlipidemia Sister    History   Social History  . Marital Status: Divorced    Spouse Name: N/A    Number of Children: N/A  . Years of Education: N/A   Occupational History  . Not on file.   Social History Main Topics  . Smoking status: Former Smoker -- 1.00 packs/day for 25 years    Types: Cigarettes    Quit date: 04/04/2000  . Smokeless tobacco: Never Used  . Alcohol Use: Yes     Comment: 11/30/2012 "have a glass of red wine on my birthday q yr; that's it"  . Drug Use: No  . Sexual Activity: Not Currently    Birth Control/ Protection: Abstinence   Other Topics Concern  . Not on file   Social History Narrative  . No narrative on file    Review of Systems  Constitutional: Negative for fever, chills, diaphoresis, activity change, appetite change and fatigue.  HENT:  positive for congestion, facial swelling, rhinorrhea, neck pain, neck stiffness and ear discharge.   Eyes: Negative for pain, discharge, redness, itching and visual disturbance.  Respiratory: Negative for cough, choking, chest tightness, shortness of breath, wheezing and stridor.   Cardiovascular: Negative for chest pain, palpitations and leg swelling.  Gastrointestinal: Negative for abdominal distention.  Genitourinary: Negative for dysuria, urgency, frequency, hematuria, flank pain, decreased urine volume, difficulty urinating and dyspareunia.  Musculoskeletal: Negative for back pain, joint swelling, arthralgias and gait problem.  Neurological: Negative  for dizziness, tremors, seizures, syncope, facial asymmetry, speech difficulty, weakness, light-headedness, numbness and headaches.  Hematological: Negative for adenopathy. Does not bruise/bleed easily.  Psychiatric/Behavioral: Negative for hallucinations, behavioral problems, confusion, dysphoric mood, decreased concentration and agitation.    Objective:  Filed Vitals:   06/21/13 1211  BP: 155/84  Pulse: 71  Temp: 98.8 F (37.1 C)  Resp: 14    Physical Exam  Constitutional: Appears well-developed and well-nourished. No distress.  HENT: Normocephalic. External right and left ear normal. Oropharynx is clear and moist.  Eyes: Conjunctivae and EOM are normal. PERRLA, no scleral icterus.  Neck: Normal ROM. Neck supple. No JVD. No tracheal deviation. No thyromegaly.  CVS: RRR, S1/S2 +, no murmurs, no gallops, no carotid bruit.  Pulmonary: Effort and breath sounds normal, no stridor, rhonchi, wheezes, rales.  Abdominal: Soft. BS +,  no distension, tenderness, rebound or guarding.  Musculoskeletal: Normal range of motion. No edema and no tenderness.  Lymphadenopathy: No lymphadenopathy noted, cervical, inguinal. Neuro: Alert. Normal reflexes, muscle tone coordination. No cranial nerve deficit. Skin: Skin is warm and dry. No rash noted. Not diaphoretic. No erythema. No pallor.  Psychiatric: Normal mood and affect. Behavior, judgment, thought content normal.   Lab Results  Component Value Date   WBC 7.2 06/02/2013   HGB 13.5 06/02/2013   HCT 40.7 06/02/2013   MCV 94.7 06/02/2013   PLT 220 06/02/2013   Lab Results  Component Value Date   CREATININE 1.67* 06/02/2013   BUN 31* 06/02/2013   NA 134* 06/02/2013   K 5.1 06/02/2013   CL 102 06/02/2013   CO2 25 06/02/2013    Lab Results  Component Value Date   HGBA1C 10.2 06/02/2013   Lipid Panel     Component Value Date/Time   CHOL 209* 06/02/2013 1514   TRIG 169* 06/02/2013 1514   HDL 48 06/02/2013 1514   CHOLHDL 4.4 06/02/2013 1514    VLDL 34 06/02/2013 1514   LDLCALC 127* 06/02/2013 1514       Assessment and plan:   Patient Active Problem List   Diagnosis Date Noted  . CKD 4, baseline creatinine around 1.4 03/01/2013  . Atherosclerotic peripheral vascular disease 02/01/2013  . Diabetes mellitus type 2 in obese 02/01/2013  . CAD (coronary artery disease) 02/01/2013  . S/P CABG (coronary artery bypass graft) 02/01/2013  . Essential hypertension, benign 02/01/2013  . Unspecified hypothyroidism 02/01/2013  . PVD, LSFA PTA 12/12 04/06/2011  . HTN (hypertension), malignant, patent renal arteries 04/06/2011  . Hx of CABG x 6 2002, cath 2011 patent grafts, medical Rx 04/06/2011  . Diabetes mellitus with chronic kidney disease 04/06/2011  . Dyslipidemia 04/06/2011   Insomnia  Patient has not been taking her Klonopin for the last 2 months Discussed with the patient and we will switch her to trazodone instead for insomnia  Diabetes Patient given a prescription to give her enough insulin for a month. The current prescription lasts for only 8-10 days Encouraged to use the full recommended dose of 70 units twice a day  Patient to follow up in 3 months        The patient was given clear instructions to go to ER or return to medical center if symptoms don't improve, worsen or new problems develop. The patient verbalized understanding. The patient was told to call to get any lab results if not heard anything in the next week.

## 2013-06-21 NOTE — Progress Notes (Signed)
Patient is here for a medication refill of clonazepam. Patient has muscle spasms while sleeping. BP today 155/83, pulse 71. Patient's BP averages around the same.Complains of abdominal bloating x1 year; mostly after eating. No other complaints.

## 2013-06-22 ENCOUNTER — Telehealth (HOSPITAL_COMMUNITY): Payer: Self-pay | Admitting: *Deleted

## 2013-06-30 ENCOUNTER — Encounter (HOSPITAL_COMMUNITY): Payer: Medicare Other

## 2013-07-05 ENCOUNTER — Ambulatory Visit: Payer: Medicare Other | Admitting: Cardiovascular Disease

## 2013-07-21 DIAGNOSIS — I739 Peripheral vascular disease, unspecified: Secondary | ICD-10-CM | POA: Diagnosis not present

## 2013-07-21 DIAGNOSIS — N183 Chronic kidney disease, stage 3 unspecified: Secondary | ICD-10-CM | POA: Diagnosis not present

## 2013-07-21 DIAGNOSIS — E669 Obesity, unspecified: Secondary | ICD-10-CM | POA: Diagnosis not present

## 2013-07-21 DIAGNOSIS — I129 Hypertensive chronic kidney disease with stage 1 through stage 4 chronic kidney disease, or unspecified chronic kidney disease: Secondary | ICD-10-CM | POA: Diagnosis not present

## 2013-08-03 ENCOUNTER — Encounter (HOSPITAL_COMMUNITY): Payer: Self-pay | Admitting: Emergency Medicine

## 2013-08-03 ENCOUNTER — Observation Stay (HOSPITAL_COMMUNITY)
Admission: EM | Admit: 2013-08-03 | Discharge: 2013-08-04 | Disposition: A | Discharge status: Home / Self Care | Payer: Medicare Other | Attending: Internal Medicine | Admitting: Internal Medicine

## 2013-08-03 ENCOUNTER — Emergency Department (HOSPITAL_COMMUNITY): Payer: Medicare Other

## 2013-08-03 DIAGNOSIS — J45909 Unspecified asthma, uncomplicated: Secondary | ICD-10-CM | POA: Insufficient documentation

## 2013-08-03 DIAGNOSIS — M129 Arthropathy, unspecified: Secondary | ICD-10-CM | POA: Diagnosis not present

## 2013-08-03 DIAGNOSIS — R739 Hyperglycemia, unspecified: Secondary | ICD-10-CM

## 2013-08-03 DIAGNOSIS — M1710 Unilateral primary osteoarthritis, unspecified knee: Secondary | ICD-10-CM | POA: Diagnosis not present

## 2013-08-03 DIAGNOSIS — J42 Unspecified chronic bronchitis: Secondary | ICD-10-CM | POA: Diagnosis not present

## 2013-08-03 DIAGNOSIS — M109 Gout, unspecified: Secondary | ICD-10-CM | POA: Diagnosis not present

## 2013-08-03 DIAGNOSIS — E039 Hypothyroidism, unspecified: Secondary | ICD-10-CM | POA: Diagnosis not present

## 2013-08-03 DIAGNOSIS — J9819 Other pulmonary collapse: Secondary | ICD-10-CM | POA: Diagnosis not present

## 2013-08-03 DIAGNOSIS — I509 Heart failure, unspecified: Secondary | ICD-10-CM | POA: Insufficient documentation

## 2013-08-03 DIAGNOSIS — N182 Chronic kidney disease, stage 2 (mild): Secondary | ICD-10-CM | POA: Insufficient documentation

## 2013-08-03 DIAGNOSIS — R0789 Other chest pain: Principal | ICD-10-CM | POA: Insufficient documentation

## 2013-08-03 DIAGNOSIS — I252 Old myocardial infarction: Secondary | ICD-10-CM | POA: Diagnosis not present

## 2013-08-03 DIAGNOSIS — G43909 Migraine, unspecified, not intractable, without status migrainosus: Secondary | ICD-10-CM | POA: Diagnosis not present

## 2013-08-03 DIAGNOSIS — Z951 Presence of aortocoronary bypass graft: Secondary | ICD-10-CM | POA: Insufficient documentation

## 2013-08-03 DIAGNOSIS — I2510 Atherosclerotic heart disease of native coronary artery without angina pectoris: Secondary | ICD-10-CM | POA: Insufficient documentation

## 2013-08-03 DIAGNOSIS — N183 Chronic kidney disease, stage 3 (moderate): Secondary | ICD-10-CM

## 2013-08-03 DIAGNOSIS — E1165 Type 2 diabetes mellitus with hyperglycemia: Secondary | ICD-10-CM

## 2013-08-03 DIAGNOSIS — I129 Hypertensive chronic kidney disease with stage 1 through stage 4 chronic kidney disease, or unspecified chronic kidney disease: Secondary | ICD-10-CM | POA: Insufficient documentation

## 2013-08-03 DIAGNOSIS — Z9889 Other specified postprocedural states: Secondary | ICD-10-CM | POA: Insufficient documentation

## 2013-08-03 DIAGNOSIS — R079 Chest pain, unspecified: Secondary | ICD-10-CM | POA: Diagnosis present

## 2013-08-03 DIAGNOSIS — Z79899 Other long term (current) drug therapy: Secondary | ICD-10-CM | POA: Insufficient documentation

## 2013-08-03 DIAGNOSIS — I739 Peripheral vascular disease, unspecified: Secondary | ICD-10-CM | POA: Diagnosis not present

## 2013-08-03 DIAGNOSIS — Z8701 Personal history of pneumonia (recurrent): Secondary | ICD-10-CM | POA: Diagnosis not present

## 2013-08-03 DIAGNOSIS — I10 Essential (primary) hypertension: Secondary | ICD-10-CM

## 2013-08-03 DIAGNOSIS — Z87891 Personal history of nicotine dependence: Secondary | ICD-10-CM | POA: Diagnosis not present

## 2013-08-03 DIAGNOSIS — Z7982 Long term (current) use of aspirin: Secondary | ICD-10-CM | POA: Insufficient documentation

## 2013-08-03 DIAGNOSIS — F411 Generalized anxiety disorder: Secondary | ICD-10-CM | POA: Insufficient documentation

## 2013-08-03 DIAGNOSIS — F3289 Other specified depressive episodes: Secondary | ICD-10-CM | POA: Diagnosis not present

## 2013-08-03 DIAGNOSIS — E119 Type 2 diabetes mellitus without complications: Secondary | ICD-10-CM | POA: Insufficient documentation

## 2013-08-03 DIAGNOSIS — Z794 Long term (current) use of insulin: Secondary | ICD-10-CM | POA: Insufficient documentation

## 2013-08-03 DIAGNOSIS — Z880 Allergy status to penicillin: Secondary | ICD-10-CM | POA: Diagnosis not present

## 2013-08-03 DIAGNOSIS — Z9861 Coronary angioplasty status: Secondary | ICD-10-CM | POA: Diagnosis not present

## 2013-08-03 DIAGNOSIS — D649 Anemia, unspecified: Secondary | ICD-10-CM | POA: Insufficient documentation

## 2013-08-03 DIAGNOSIS — F329 Major depressive disorder, single episode, unspecified: Secondary | ICD-10-CM | POA: Diagnosis not present

## 2013-08-03 DIAGNOSIS — R6889 Other general symptoms and signs: Secondary | ICD-10-CM | POA: Diagnosis not present

## 2013-08-03 DIAGNOSIS — M25579 Pain in unspecified ankle and joints of unspecified foot: Secondary | ICD-10-CM | POA: Diagnosis not present

## 2013-08-03 DIAGNOSIS — IMO0002 Reserved for concepts with insufficient information to code with codable children: Secondary | ICD-10-CM | POA: Insufficient documentation

## 2013-08-03 DIAGNOSIS — K219 Gastro-esophageal reflux disease without esophagitis: Secondary | ICD-10-CM | POA: Insufficient documentation

## 2013-08-03 DIAGNOSIS — M25569 Pain in unspecified knee: Secondary | ICD-10-CM | POA: Diagnosis not present

## 2013-08-03 DIAGNOSIS — E785 Hyperlipidemia, unspecified: Secondary | ICD-10-CM

## 2013-08-03 DIAGNOSIS — R0602 Shortness of breath: Secondary | ICD-10-CM | POA: Diagnosis not present

## 2013-08-03 DIAGNOSIS — N1832 Chronic kidney disease, stage 3b: Secondary | ICD-10-CM

## 2013-08-03 DIAGNOSIS — E1122 Type 2 diabetes mellitus with diabetic chronic kidney disease: Secondary | ICD-10-CM

## 2013-08-03 DIAGNOSIS — E669 Obesity, unspecified: Secondary | ICD-10-CM | POA: Insufficient documentation

## 2013-08-03 DIAGNOSIS — E1169 Type 2 diabetes mellitus with other specified complication: Secondary | ICD-10-CM

## 2013-08-03 DIAGNOSIS — I70209 Unspecified atherosclerosis of native arteries of extremities, unspecified extremity: Secondary | ICD-10-CM

## 2013-08-03 HISTORY — DX: Headache: R51

## 2013-08-03 HISTORY — DX: Headache, unspecified: R51.9

## 2013-08-03 HISTORY — DX: Personal history of other medical treatment: Z92.89

## 2013-08-03 HISTORY — DX: Gout, unspecified: M10.9

## 2013-08-03 LAB — COMPREHENSIVE METABOLIC PANEL
ALBUMIN: 4.1 g/dL (ref 3.5–5.2)
ALK PHOS: 117 U/L (ref 39–117)
ALT: 23 U/L (ref 0–35)
AST: 23 U/L (ref 0–37)
BUN: 35 mg/dL — AB (ref 6–23)
CHLORIDE: 95 meq/L — AB (ref 96–112)
CO2: 23 mEq/L (ref 19–32)
Calcium: 11.1 mg/dL — ABNORMAL HIGH (ref 8.4–10.5)
Creatinine, Ser: 1.73 mg/dL — ABNORMAL HIGH (ref 0.50–1.10)
GFR calc Af Amer: 36 mL/min — ABNORMAL LOW (ref >90)
GFR calc non Af Amer: 31 mL/min — ABNORMAL LOW (ref >90)
Glucose, Bld: 526 mg/dL — ABNORMAL HIGH (ref 70–99)
POTASSIUM: 4.5 meq/L (ref 3.7–5.3)
SODIUM: 130 meq/L — AB (ref 137–147)
Total Bilirubin: 0.4 mg/dL (ref 0.3–1.2)
Total Protein: 8 g/dL (ref 6.0–8.3)

## 2013-08-03 LAB — CBC
HCT: 36.6 % (ref 36.0–46.0)
Hemoglobin: 13.1 g/dL (ref 12.0–15.0)
MCH: 33.1 pg (ref 26.0–34.0)
MCHC: 35.8 g/dL (ref 30.0–36.0)
MCV: 92.4 fL (ref 78.0–100.0)
PLATELETS: 193 10*3/uL (ref 150–400)
RBC: 3.96 MIL/uL (ref 3.87–5.11)
RDW: 13 % (ref 11.5–15.5)
WBC: 5.2 10*3/uL (ref 4.0–10.5)

## 2013-08-03 LAB — I-STAT TROPONIN, ED: TROPONIN I, POC: 0.01 ng/mL (ref 0.00–0.08)

## 2013-08-03 LAB — APTT: aPTT: 28 seconds (ref 24–37)

## 2013-08-03 LAB — PROTIME-INR
INR: 0.91 (ref 0.00–1.49)
Prothrombin Time: 12.1 seconds (ref 11.6–15.2)

## 2013-08-03 MED ORDER — SODIUM CHLORIDE 0.9 % IV BOLUS (SEPSIS)
500.0000 mL | Freq: Once | INTRAVENOUS | Status: AC
  Administered 2013-08-03: 500 mL via INTRAVENOUS

## 2013-08-03 MED ORDER — SODIUM CHLORIDE 0.9 % IV SOLN
1000.0000 mL | INTRAVENOUS | Status: DC
  Administered 2013-08-03: 1000 mL via INTRAVENOUS

## 2013-08-03 MED ORDER — ASPIRIN 81 MG PO CHEW
324.0000 mg | CHEWABLE_TABLET | Freq: Once | ORAL | Status: AC
  Administered 2013-08-03: 324 mg via ORAL
  Filled 2013-08-03: qty 4

## 2013-08-03 MED ORDER — SODIUM CHLORIDE 0.9 % IV SOLN
INTRAVENOUS | Status: DC
  Filled 2013-08-03: qty 1

## 2013-08-03 MED ORDER — NITROGLYCERIN 2 % TD OINT
1.0000 [in_us] | TOPICAL_OINTMENT | Freq: Once | TRANSDERMAL | Status: AC
  Administered 2013-08-03: 1 [in_us] via TOPICAL
  Filled 2013-08-03: qty 1

## 2013-08-03 NOTE — ED Notes (Addendum)
Pt to ED via [facility] for evaluation of shortness of breath and chest tightness.  Pt reports having an argument earlier tonight, became short of breath and used inhaler at home X2- reports that she is still short of breath and chest feels tight.  Pt hypertensive initially with EMS [id number] systolic- currently [id number].  Pt tearful at present, states she started feeling depressed after the argument tonight.  Denies SI/HI, Sinus rhythm on monitor.

## 2013-08-03 NOTE — ED Notes (Signed)
Phlebotomy aware not able to get blood from IV stick

## 2013-08-03 NOTE — ED Provider Notes (Signed)
CSN: [id number]     Arrival date & time 08/03/13  2142 History   First MD Initiated Contact with Patient 08/03/13 2144     Chief Complaint  Patient presents with  . Shortness of Breath    Patient is a 58 y.o. female presenting with shortness of breath. The history is provided by the patient.  Shortness of Breath Severity:  Moderate Onset quality:  Gradual Duration: this afternoon. Timing:  Constant Progression since onset: Pt had a family arguement and after that it got worse. Relieved by: she tried her inhalers and that helped. Associated symptoms: chest pain (fleeting, none now , tight in the center, mild)   Associated symptoms: no abdominal pain, no fever and no vomiting    patient states she was having some trouble with shortness of breath today. She noticed the symptoms become acutely worse after having an argument with family members. He argument she also developed chest discomfort. Patient states it's sharp pain also described as a pressure in the substernal region. Does not radiate. She is not feeling nauseated. She is not diaphoretic. Patient does have history of heart disease. She does not think that the symptoms are necessarily similar to her heart disease but she's not sure. She denies any fever coughing no abdominal pain.  Past Medical History  Diagnosis Date  . Asthma   . Anxiety   . GERD (gastroesophageal reflux disease)   . Coronary artery disease 2002    CABG x 6. Cath 5/11- med Rx  . Hypertension   . Peripheral vascular disease 12/12    LSFA PTA  . Anemia   . Hyperlipidemia   . Chronic renal insufficiency, stage II (mild)     followed  by [facility]  . CAD (coronary artery disease) 2002    CABG x 6 2002, cath 2011- med Rx  . Hypothyroid     treated  . Obesity (BMI 35.0-39.9 without comorbidity)   . CHF (congestive heart failure)     "in 2002" (11/26/2012)  . Myocardial infarction 2000; 2002; 2011?  [name] Chronic bronchitis     "q year; in the winter"  (11/30/2012)  . Pneumonia     "3 times I think" (11/30/2012)  . Type II diabetes mellitus   . Migraines     "couple times/year" (11/30/2012)  . Arthritis     "stiff fingers and knees" (11/30/2012)  . Depression    Past Surgical History  Procedure Laterality Date  . Cholecystectomy  1982  . Cesarean section  1978; 1980  . Tubal ligation  1980  . Breast cyst excision Right 1970's  . Coronary artery bypass graft  11/20/2000    x6 LIMA to distal LAD, svg to first diag, svg to ramus intermediate branch and swquential SVG to cir marginal branch, SVG to posterior descending coronary and sequential SVG to first right posterolateral branch  . Cardiac catheterization  2002  . Coronary angioplasty with stent placement  2004; 2012    "I have 2 stents" 11/30/2012)  . Peripheral arterial stent graft Left     SFA/notes 04/07/2011 (11/30/2012)  . Nm myocar perf wall motion  08/27/2004    negative   Family History  Problem Relation Age of Onset  . Diabetes Mother   . Hypertension Mother   . Stroke Mother   . Hypertension Father   . Hypertension Brother   . Hypertension Sister   . Diabetes Sister   . Hyperlipidemia Sister    History  Substance Use Topics  .  Smoking status: Former Smoker -- 1.00 packs/day for 25 years    Types: Cigarettes    Quit date: 04/04/2000  . Smokeless tobacco: Never Used  . Alcohol Use: Yes     Comment: 11/30/2012 "have a glass of red wine on my birthday q yr; that's it"   OB History   Grav Para Term Preterm Abortions TAB SAB Ect Mult Living                 Review of Systems  Constitutional: Negative for fever.  Respiratory: Positive for shortness of breath.   Cardiovascular: Positive for chest pain (fleeting, none now , tight in the center, mild).  Gastrointestinal: Negative for vomiting and abdominal pain.  All other systems reviewed and are negative.     Allergies  Penicillins cross reactors and Adhesive  Home Medications   Prior to Admission  medications   Medication Sig Start Date End Date Taking? Authorizing Provider  albuterol (PROVENTIL HFA;VENTOLIN HFA) 108 (90 BASE) MCG/ACT inhaler Inhale 2 puffs into the lungs every 6 (six) hours as needed. For shortness of breath 06/02/13   Reyne Dumas, MD  amLODipine (NORVASC) 10 MG tablet Take 1 tablet (10 mg total) by mouth daily. 06/02/13   Reyne Dumas, MD  aspirin 81 MG tablet Take 81 mg by mouth daily.      Historical Provider, MD  atorvastatin (LIPITOR) 40 MG tablet Take 1 tablet (40 mg total) by mouth daily. 06/02/13   Reyne Dumas, MD  cloNIDine (CATAPRES) 0.2 MG tablet Take 1 tablet (0.2 mg total) by mouth 3 (three) times daily. 06/02/13   Reyne Dumas, MD  clopidogrel (PLAVIX) 75 MG tablet Take 1 tablet (75 mg total) by mouth daily. 06/02/13   Reyne Dumas, MD  fluticasone (FLONASE) 50 MCG/ACT nasal spray Place 1 spray into both nostrils daily as needed for rhinitis or allergies. 06/02/13   Reyne Dumas, MD  furosemide (LASIX) 20 MG tablet Take 1 tablet (20 mg total) by mouth daily. 06/02/13   Reyne Dumas, MD  gabapentin (NEURONTIN) 400 MG capsule Take 1 capsule (400 mg total) by mouth daily. 06/02/13   Reyne Dumas, MD  glimepiride (AMARYL) 4 MG tablet Take 1 tablet (4 mg total) by mouth 2 (two) times daily. 05/11/13   Lorayne Marek, MD  Insulin Aspart Prot & Aspart (NOVOLOG MIX 70/30 FLEXPEN) (70-30) 100 UNIT/ML Pen Inject 70 Units into the skin 2 (two) times daily before a meal. Substitute with a pen. Dispense required syringes and needles one month supply 1 refill 06/21/13   Reyne Dumas, MD  isosorbide dinitrate (ISORDIL) 20 MG tablet Take 1 tablet (20 mg total) by mouth daily. 06/02/13   Reyne Dumas, MD  levothyroxine (SYNTHROID, LEVOTHROID) 25 MCG tablet Take 1 tablet (25 mcg total) by mouth daily before breakfast. 05/11/13   Lorayne Marek, MD  linagliptin (TRADJENTA) 5 MG TABS tablet Take 1 tablet (5 mg total) by mouth daily. 06/02/13   Reyne Dumas, MD  loratadine (CLARITIN) 10 MG tablet  Take 1 tablet (10 mg total) by mouth daily. 06/02/13   Reyne Dumas, MD  Menthol-Methyl Salicylate (BEN GAY GREASELESS) 10-15 % greaseless cream Apply 1 application topically daily as needed for pain (for foot).    Historical Provider, MD  metoprolol (TOPROL-XL) 200 MG 24 hr tablet Take 0.5 tablets (100 mg total) by mouth daily. 06/02/13   Reyne Dumas, MD  naproxen (NAPROSYN) 500 MG tablet Take 1 tablet (500 mg total) by mouth 2 (two) times daily with a meal.  03/22/13   Lorayne Marek, MD  nitroGLYCERIN (NITROSTAT) 0.4 MG SL tablet Place 0.4 mg under the tongue every 5 (five) minutes as needed. For chest      Historical Provider, MD  polyethylene glycol powder (GLYCOLAX/MIRALAX) powder Take 17 g by mouth 2 (two) times daily as needed. 06/02/13   Reyne Dumas, MD  ranitidine (ZANTAC) 75 MG tablet Take 75 mg by mouth daily as needed for heartburn.    Historical Provider, MD  traMADol (ULTRAM) 50 MG tablet Take 1 tablet (50 mg total) by mouth every 8 (eight) hours as needed. For pain/Maximum dose= 8 tablets per day 06/02/13   Reyne Dumas, MD  traZODone (DESYREL) 50 MG tablet Take 0.5-1 tablets (25-50 mg total) by mouth at bedtime as needed for sleep. 06/21/13   Reyne Dumas, MD   BP 127/75  Pulse 86  Temp(Src) 99.6 F (37.6 C)  Resp 17  Ht 5\' 10"  (1.778 m)  Wt 240 lb (108.863 kg)  BMI 34.44 kg/m2  SpO2 96% Physical Exam  Nursing note and vitals reviewed. Constitutional: She appears well-developed and well-nourished. No distress.  HENT:  Head: Normocephalic and atraumatic.  Right Ear: External ear normal.  Left Ear: External ear normal.  Eyes: Conjunctivae are normal. Right eye exhibits no discharge. Left eye exhibits no discharge. No scleral icterus.  Eyes injected, it appears pt has been crying   Neck: Neck supple. No tracheal deviation present.  Cardiovascular: Normal rate, regular rhythm and intact distal pulses.   Pulmonary/Chest: Effort normal and breath sounds normal. No stridor. No  respiratory distress. She has no wheezes. She has no rales.  Abdominal: Soft. Bowel sounds are normal. She exhibits no distension. There is no tenderness. There is no rebound and no guarding.  Musculoskeletal: She exhibits no edema and no tenderness.  Neurological: She is alert. She has normal strength. No cranial nerve deficit (no facial droop, extraocular movements intact, no slurred speech) or sensory deficit. She exhibits normal muscle tone. She displays no seizure activity. Coordination normal.  Skin: Skin is warm and dry. No rash noted.  Psychiatric:  Flat affect, appears upset    ED Course  Procedures (including critical care time) Labs Review Labs Reviewed  COMPREHENSIVE METABOLIC PANEL - Abnormal; Notable for the following:    Sodium 130 (*)    Chloride 95 (*)    Glucose, Bld 526 (*)    BUN 35 (*)    Creatinine, Ser 1.73 (*)    Calcium 11.1 (*)    GFR calc non Af Amer 31 (*)    GFR calc Af Amer 36 (*)    All other components within normal limits  CBC  PROTIME-INR  APTT  I-STAT TROPOININ, ED    Imaging Review Dg Chest Portable 1 View  08/03/2013   CLINICAL DATA:  Short of breath  EXAM: PORTABLE CHEST - 1 VIEW  COMPARISON:  DG CHEST 2 VIEW dated 01/05/2011  FINDINGS: Mild cardiomegaly. Left midlung subsegmental atelectasis for scar. Lungs otherwise clear. No pneumothorax.  IMPRESSION: No active cardiopulmonary disease.  Mild cardiomegaly.   Electronically Signed   By: Maryclare Bean M.D.   On: 08/03/2013 22:38     EKG Interpretation   Date/Time:  Tuesday August 03 2013 21:50:51 EDT Ventricular Rate:  87 PR Interval:  184 QRS Duration: 90 QT Interval:  441 QTC Calculation: 531 R Axis:   21 Text Interpretation:  Sinus rhythm Probable left atrial enlargement  Borderline T abnormalities, inferior leads Prolonged QT interval No  significant  change since last tracing Confirmed by Eisley Barber  MD-J, Arty Lantzy  [phone]) on 08/03/2013 10:36:10 PM     Medications  0.9 %  sodium chloride  infusion (1,000 mLs Intravenous New Bag/Given 08/03/13 2228)  insulin regular (NOVOLIN R,HUMULIN R) 1 Units/mL in sodium chloride 0.9 % 100 mL infusion (not administered)  aspirin chewable tablet 324 mg (324 mg Oral Given 08/03/13 2228)  nitroGLYCERIN (NITROGLYN) 2 % ointment 1 inch (1 inch Topical Given 08/03/13 2229)  sodium chloride 0.9 % bolus 500 mL (500 mLs Intravenous New Bag/Given 08/03/13 2333)    MDM   Final diagnoses:  Chest pain  Hyperglycemia   Chest discomfort and shortness of breath has improved after ntg.  Known history of CAD.  No EKG changes and initial toponin negative.  Plan on admission for observation. Insulin started for her hyperglycemia without DKA.    Kathalene Frames, MD 08/03/13 [phone]

## 2013-08-04 ENCOUNTER — Encounter (HOSPITAL_COMMUNITY): Payer: Self-pay | Admitting: General Practice

## 2013-08-04 DIAGNOSIS — N183 Chronic kidney disease, stage 3 unspecified: Secondary | ICD-10-CM

## 2013-08-04 DIAGNOSIS — E119 Type 2 diabetes mellitus without complications: Secondary | ICD-10-CM | POA: Diagnosis not present

## 2013-08-04 DIAGNOSIS — E039 Hypothyroidism, unspecified: Secondary | ICD-10-CM

## 2013-08-04 DIAGNOSIS — IMO0001 Reserved for inherently not codable concepts without codable children: Secondary | ICD-10-CM

## 2013-08-04 DIAGNOSIS — I739 Peripheral vascular disease, unspecified: Secondary | ICD-10-CM

## 2013-08-04 DIAGNOSIS — R079 Chest pain, unspecified: Secondary | ICD-10-CM | POA: Diagnosis present

## 2013-08-04 DIAGNOSIS — I70209 Unspecified atherosclerosis of native arteries of extremities, unspecified extremity: Secondary | ICD-10-CM

## 2013-08-04 DIAGNOSIS — E669 Obesity, unspecified: Secondary | ICD-10-CM

## 2013-08-04 DIAGNOSIS — I2510 Atherosclerotic heart disease of native coronary artery without angina pectoris: Secondary | ICD-10-CM

## 2013-08-04 DIAGNOSIS — E1165 Type 2 diabetes mellitus with hyperglycemia: Secondary | ICD-10-CM

## 2013-08-04 DIAGNOSIS — R7309 Other abnormal glucose: Secondary | ICD-10-CM

## 2013-08-04 DIAGNOSIS — I10 Essential (primary) hypertension: Secondary | ICD-10-CM

## 2013-08-04 LAB — BASIC METABOLIC PANEL
BUN: 33 mg/dL — ABNORMAL HIGH (ref 6–23)
CO2: 23 meq/L (ref 19–32)
CREATININE: 1.73 mg/dL — AB (ref 0.50–1.10)
Calcium: 11.2 mg/dL — ABNORMAL HIGH (ref 8.4–10.5)
Chloride: 102 mEq/L (ref 96–112)
GFR calc non Af Amer: 31 mL/min — ABNORMAL LOW (ref >90)
GFR, EST AFRICAN AMERICAN: 36 mL/min — AB (ref >90)
Glucose, Bld: 337 mg/dL — ABNORMAL HIGH (ref 70–99)
POTASSIUM: 4.5 meq/L (ref 3.7–5.3)
Sodium: 139 mEq/L (ref 137–147)

## 2013-08-04 LAB — GLUCOSE, CAPILLARY
GLUCOSE-CAPILLARY: 267 mg/dL — AB (ref 70–99)
GLUCOSE-CAPILLARY: 292 mg/dL — AB (ref 70–99)
Glucose-Capillary: 413 mg/dL — ABNORMAL HIGH (ref 70–99)

## 2013-08-04 LAB — CBC
HEMATOCRIT: 34.8 % — AB (ref 36.0–46.0)
Hemoglobin: 12.4 g/dL (ref 12.0–15.0)
MCH: 33.1 pg (ref 26.0–34.0)
MCHC: 35.6 g/dL (ref 30.0–36.0)
MCV: 92.8 fL (ref 78.0–100.0)
Platelets: 189 10*3/uL (ref 150–400)
RBC: 3.75 MIL/uL — AB (ref 3.87–5.11)
RDW: 13.2 % (ref 11.5–15.5)
WBC: 5.4 10*3/uL (ref 4.0–10.5)

## 2013-08-04 LAB — LIPID PANEL
Cholesterol: 180 mg/dL (ref 0–200)
HDL: 35 mg/dL — ABNORMAL LOW (ref >39)
LDL Cholesterol: 99 mg/dL (ref 0–99)
TRIGLYCERIDES: 230 mg/dL — AB (ref <150)
Total CHOL/HDL Ratio: 5.1 RATIO
VLDL: 46 mg/dL — ABNORMAL HIGH (ref 0–40)

## 2013-08-04 LAB — HEMOGLOBIN A1C
Hgb A1c MFr Bld: 10.7 % — ABNORMAL HIGH (ref <5.7)
Mean Plasma Glucose: 260 mg/dL — ABNORMAL HIGH (ref <117)

## 2013-08-04 LAB — CBG MONITORING, ED: GLUCOSE-CAPILLARY: 397 mg/dL — AB (ref 70–99)

## 2013-08-04 LAB — TROPONIN I

## 2013-08-04 MED ORDER — INSULIN GLARGINE 100 UNIT/ML ~~LOC~~ SOLN: 70.0000 [IU] | Freq: Every day | SUBCUTANEOUS | Status: DC

## 2013-08-04 MED ORDER — GABAPENTIN 400 MG PO CAPS
400.0000 mg | ORAL_CAPSULE | Freq: Every day | ORAL | Status: DC
  Administered 2013-08-04: 400 mg via ORAL
  Filled 2013-08-04: qty 1

## 2013-08-04 MED ORDER — ACETAMINOPHEN 325 MG PO TABS: 650.0000 mg | ORAL_TABLET | Freq: Four times a day (QID) | ORAL | Status: DC | PRN

## 2013-08-04 MED ORDER — INSULIN ASPART 100 UNIT/ML ~~LOC~~ SOLN
0.0000 [IU] | Freq: Three times a day (TID) | SUBCUTANEOUS | Status: DC
  Administered 2013-08-04 (×2): 8 [IU] via SUBCUTANEOUS

## 2013-08-04 MED ORDER — OXYCODONE HCL 5 MG PO TABS
5.0000 mg | ORAL_TABLET | ORAL | Status: DC | PRN
Start: 2013-08-04 — End: 2013-08-04
  Administered 2013-08-04: 5 mg via ORAL
  Filled 2013-08-04: qty 1

## 2013-08-04 MED ORDER — HYDROMORPHONE HCL PF 1 MG/ML IJ SOLN: 0.5000 mg | INTRAMUSCULAR | Status: DC | PRN

## 2013-08-04 MED ORDER — FUROSEMIDE 20 MG PO TABS
20.0000 mg | ORAL_TABLET | Freq: Every day | ORAL | Status: DC
  Filled 2013-08-04: qty 1

## 2013-08-04 MED ORDER — INSULIN ASPART 100 UNIT/ML ~~LOC~~ SOLN
0.0000 [IU] | Freq: Every day | SUBCUTANEOUS | Status: DC
  Administered 2013-08-04: 5 [IU] via SUBCUTANEOUS

## 2013-08-04 MED ORDER — SODIUM CHLORIDE 0.9 % IV SOLN: INTRAVENOUS | Status: DC

## 2013-08-04 MED ORDER — ALLOPURINOL 150 MG HALF TABLET
150.0000 mg | ORAL_TABLET | Freq: Every day | ORAL | Status: DC
  Administered 2013-08-04: 150 mg via ORAL
  Filled 2013-08-04: qty 1

## 2013-08-04 MED ORDER — GLIMEPIRIDE 4 MG PO TABS
4.0000 mg | ORAL_TABLET | Freq: Two times a day (BID) | ORAL | Status: DC
  Administered 2013-08-04: 4 mg via ORAL
  Filled 2013-08-04 (×3): qty 1

## 2013-08-04 MED ORDER — ONDANSETRON HCL 4 MG PO TABS: 4.0000 mg | ORAL_TABLET | Freq: Four times a day (QID) | ORAL | Status: DC | PRN

## 2013-08-04 MED ORDER — ONDANSETRON HCL 4 MG/2ML IJ SOLN: 4.0000 mg | Freq: Four times a day (QID) | INTRAMUSCULAR | Status: DC | PRN

## 2013-08-04 MED ORDER — INSULIN SYRINGES (DISPOSABLE) U-100 1 ML MISC: Status: DC

## 2013-08-04 MED ORDER — ALBUTEROL SULFATE (2.5 MG/3ML) 0.083% IN NEBU: 3.0000 mL | INHALATION_SOLUTION | Freq: Four times a day (QID) | RESPIRATORY_TRACT | Status: DC | PRN

## 2013-08-04 MED ORDER — ALUM & MAG HYDROXIDE-SIMETH 200-200-20 MG/5ML PO SUSP: 30.0000 mL | Freq: Four times a day (QID) | ORAL | Status: DC | PRN

## 2013-08-04 MED ORDER — POLYETHYLENE GLYCOL 3350 17 GM/SCOOP PO POWD
17.0000 g | Freq: Two times a day (BID) | ORAL | Status: DC | PRN
  Filled 2013-08-04: qty 255

## 2013-08-04 MED ORDER — AMLODIPINE BESYLATE 10 MG PO TABS
10.0000 mg | ORAL_TABLET | Freq: Every day | ORAL | Status: DC
  Administered 2013-08-04: 10 mg via ORAL
  Filled 2013-08-04: qty 1

## 2013-08-04 MED ORDER — CLONIDINE HCL 0.2 MG PO TABS
0.2000 mg | ORAL_TABLET | Freq: Three times a day (TID) | ORAL | Status: DC
  Administered 2013-08-04: 0.2 mg via ORAL
  Filled 2013-08-04 (×3): qty 1

## 2013-08-04 MED ORDER — GLIMEPIRIDE 4 MG PO TABS
4.0000 mg | ORAL_TABLET | Freq: Every day | ORAL | Status: DC
  Filled 2013-08-04: qty 1

## 2013-08-04 MED ORDER — LIVING WELL WITH DIABETES BOOK
Freq: Once | Status: AC
  Administered 2013-08-04: 12:00:00
  Filled 2013-08-04: qty 1

## 2013-08-04 MED ORDER — INSULIN ASPART 100 UNIT/ML ~~LOC~~ SOLN: 0.0000 [IU] | Freq: Every day | SUBCUTANEOUS | Status: DC

## 2013-08-04 MED ORDER — LINAGLIPTIN 5 MG PO TABS
5.0000 mg | ORAL_TABLET | Freq: Every day | ORAL | Status: DC
  Administered 2013-08-04: 5 mg via ORAL
  Filled 2013-08-04: qty 1

## 2013-08-04 MED ORDER — INSULIN ASPART PROT & ASPART (70-30 MIX) 100 UNIT/ML ~~LOC~~ SUSP
70.0000 [IU] | Freq: Two times a day (BID) | SUBCUTANEOUS | Status: DC
  Filled 2013-08-04 (×2): qty 10

## 2013-08-04 MED ORDER — TRAMADOL HCL 50 MG PO TABS: 50.0000 mg | ORAL_TABLET | Freq: Four times a day (QID) | ORAL | Status: DC | PRN

## 2013-08-04 MED ORDER — INSULIN ASPART 100 UNIT/ML ~~LOC~~ SOLN: 0.0000 [IU] | Freq: Three times a day (TID) | SUBCUTANEOUS | Status: DC

## 2013-08-04 MED ORDER — ATORVASTATIN CALCIUM 40 MG PO TABS
40.0000 mg | ORAL_TABLET | Freq: Every day | ORAL | Status: DC
  Administered 2013-08-04: 40 mg via ORAL
  Filled 2013-08-04: qty 1

## 2013-08-04 MED ORDER — FLUTICASONE PROPIONATE 50 MCG/ACT NA SUSP
1.0000 | Freq: Every day | NASAL | Status: DC | PRN
  Filled 2013-08-04: qty 16

## 2013-08-04 MED ORDER — METOPROLOL SUCCINATE ER 100 MG PO TB24
100.0000 mg | ORAL_TABLET | Freq: Every day | ORAL | Status: DC
  Administered 2013-08-04: 100 mg via ORAL
  Filled 2013-08-04: qty 1

## 2013-08-04 MED ORDER — TRAZODONE 25 MG HALF TABLET
25.0000 mg | ORAL_TABLET | Freq: Every evening | ORAL | Status: DC | PRN
  Filled 2013-08-04: qty 2

## 2013-08-04 MED ORDER — NITROGLYCERIN 2 % TD OINT: 1.0000 [in_us] | TOPICAL_OINTMENT | Freq: Four times a day (QID) | TRANSDERMAL | Status: DC

## 2013-08-04 MED ORDER — CLOPIDOGREL BISULFATE 75 MG PO TABS
75.0000 mg | ORAL_TABLET | Freq: Every day | ORAL | Status: DC
  Administered 2013-08-04: 75 mg via ORAL

## 2013-08-04 MED ORDER — TRAMADOL HCL 50 MG PO TABS: 50.0000 mg | ORAL_TABLET | Freq: Three times a day (TID) | ORAL | Status: DC | PRN

## 2013-08-04 MED ORDER — NITROGLYCERIN 0.4 MG SL SUBL: 0.4000 mg | SUBLINGUAL_TABLET | SUBLINGUAL | Status: DC | PRN

## 2013-08-04 MED ORDER — INSULIN GLARGINE 100 UNIT/ML ~~LOC~~ SOLN
70.0000 [IU] | Freq: Every day | SUBCUTANEOUS | Status: DC
  Filled 2013-08-04: qty 0.7

## 2013-08-04 MED ORDER — ENOXAPARIN SODIUM 30 MG/0.3ML ~~LOC~~ SOLN
30.0000 mg | SUBCUTANEOUS | Status: DC
  Filled 2013-08-04: qty 0.3

## 2013-08-04 MED ORDER — NAPROXEN 500 MG PO TABS
500.0000 mg | ORAL_TABLET | Freq: Two times a day (BID) | ORAL | Status: DC
  Administered 2013-08-04: 500 mg via ORAL
  Filled 2013-08-04 (×3): qty 1

## 2013-08-04 MED ORDER — GLIMEPIRIDE 4 MG PO TABS: 4.0000 mg | ORAL_TABLET | Freq: Every day | ORAL | Status: DC

## 2013-08-04 MED ORDER — ACETAMINOPHEN 650 MG RE SUPP: 650.0000 mg | Freq: Four times a day (QID) | RECTAL | Status: DC | PRN

## 2013-08-04 MED ORDER — LEVOTHYROXINE SODIUM 25 MCG PO TABS
25.0000 ug | ORAL_TABLET | Freq: Every day | ORAL | Status: DC
  Administered 2013-08-04: 25 ug via ORAL
  Filled 2013-08-04 (×2): qty 1

## 2013-08-04 MED ORDER — INSULIN ASPART PROT & ASPART (70-30 MIX) 100 UNIT/ML PEN: 70.0000 [IU] | PEN_INJECTOR | Freq: Two times a day (BID) | SUBCUTANEOUS | Status: DC

## 2013-08-04 MED ORDER — ENOXAPARIN SODIUM 40 MG/0.4ML ~~LOC~~ SOLN: 40.0000 mg | SUBCUTANEOUS | Status: DC

## 2013-08-04 NOTE — Progress Notes (Addendum)
Pt provided with dc instructions and education. Pt verbalized understanding. Pt has no questions at this time. IV removed with tip intact. Heart monitor cleaned and returned to front. Dorna Bloom, RN  Pt educated on insulin syringe and bottle and how to draw up insulin. Care notes given to patient on insulin.

## 2013-08-04 NOTE — Progress Notes (Addendum)
Spoke with patient about her diabetes.  Has been on 70/30 mixed insulin pen for about 3 years.  Has also been on Amaryl and Tradjenta, but has been out of all of her meds the last 3 days before admission.  Was able to get the 70/30 3 days ago and started taking it again.  States that with the insulin pen, she has to take her dosage twice at a time because the pen only goes up to 60 units.  Does check blood sugars 3-4 times a day.  Is followed at the [facility] and [facility].  Next scheduled appt. is June 3. Suggested to her that she might benefit from using a syringe and vial and change to Lantus and Novolog correction scale with meals.  Recommend discharging her on Lantus 70 units daily and Novolog MODERATE correction scale with meals. (syringe and vial) The Lantus dosage would be close to the dosage of the 70/30 mixed insulin she has been on. Will have staff RN check patient on insulin administration with syringe and vial. Will have staff RN give patient DM [place] notes on survival skills before discharge.  Harvel Ricks RN BSN CDE

## 2013-08-04 NOTE — H&P (Signed)
[facility] History and Physical  Brooke Stafford [id number] DOB: Nov 16, 1955 DOA: 08/03/2013  Referring physician:  PCP: Brooke Chessman, MD  Specialists:   Chief Complaint:   HPI: Brooke Stafford is a 58 y.o. female with a history of CAD, DM2, HTN who presents to the ED with complaints of Substernal Chest pain described as chest tightness and pressure without radiation that started at 8 pm after she had an argument.  The pain was relieved after she was administered 1 NTG.  She was evalauted in the ED and was found to have a negative initial cardia workup and was referred for a cardiac rule out.   She also was found to have hyperglycemia and an elevated blood pressure.      Review of Systems:  Constitutional: No Weight Loss, No Weight Gain, Night Sweats, Fevers, Chills, Fatigue, or Generalized Weakness HEENT: No Headaches, Difficulty Swallowing,Tooth/Dental Problems,Sore Throat,  No Sneezing, Rhinitis, Ear Ache, Nasal Congestion, or Post Nasal Drip,  Cardio-vascular:  +Chest pain, Orthopnea, PND, Edema in lower extremities, Anasarca, Dizziness, Palpitations  Resp: No Dyspnea, No DOE, No Productive Cough, No Non-Productive Cough, No Hemoptysis, No Change in Color of Mucus,  No Wheezing.    GI: No Heartburn, Indigestion, Abdominal Pain, Nausea, Vomiting, Diarrhea, Change in Bowel Habits,  Loss of Appetite  GU: No Dysuria, Change in Color of Urine, No Urgency or Frequency.  No flank pain.  Musculoskeletal: No Joint Pain or Swelling.  No Decreased Range of Motion. No Back Pain.  Neurologic: No Syncope, No Seizures, Muscle Weakness, Paresthesia, Vision Disturbance or Loss, No Diplopia, No Vertigo, No Difficulty Walking,  Skin: No Rash or Lesions. Psych: No Change in Mood or Affect. No Depression or Anxiety. No Memory loss. No Confusion or Hallucinations   Past Medical History  Diagnosis Date  . Asthma   . Anxiety   . GERD (gastroesophageal reflux disease)   . Coronary artery  disease 2002    CABG x 6. Cath 5/11- med Rx  . Hypertension   . Peripheral vascular disease 12/12    LSFA PTA  . Anemia   . Hyperlipidemia   . Chronic renal insufficiency, stage II (mild)     followed  by [facility]  . CAD (coronary artery disease) 2002    CABG x 6 2002, cath 2011- med Rx  . Hypothyroid     treated  . Obesity (BMI 35.0-39.9 without comorbidity)   . CHF (congestive heart failure)     "in 2002" (11/26/2012)  . Myocardial infarction 2000; 2002; 2011?  [name] Chronic bronchitis     "q year; in the winter" (11/30/2012)  . Pneumonia     "3 times I think" (11/30/2012)  . Type II diabetes mellitus   . Migraines     "couple times/year" (11/30/2012)  . Arthritis     "stiff fingers and knees" (11/30/2012)  . Depression       Past Surgical History  Procedure Laterality Date  . Cholecystectomy  1982  . Cesarean section  1978; 1980  . Tubal ligation  1980  . Breast cyst excision Right 1970's  . Coronary artery bypass graft  11/20/2000    x6 LIMA to distal LAD, svg to first diag, svg to ramus intermediate branch and swquential SVG to cir marginal branch, SVG to posterior descending coronary and sequential SVG to first right posterolateral branch  . Cardiac catheterization  2002  . Coronary angioplasty with stent placement  2004; 2012    "I have 2 stents"  11/30/2012)  . Peripheral arterial stent graft Left     SFA/notes 04/07/2011 (11/30/2012)  . Nm myocar perf wall motion  08/27/2004    negative       Prior to Admission medications   Medication Sig Start Date End Date Taking? Authorizing Provider  albuterol (PROVENTIL HFA;VENTOLIN HFA) 108 (90 BASE) MCG/ACT inhaler Inhale 2 puffs into the lungs every 6 (six) hours as needed. For shortness of breath 06/02/13  Yes Reyne Dumas, MD  allopurinol (ZYLOPRIM) 300 MG tablet Take 150 mg by mouth daily.   Yes Historical Provider, MD  amLODipine (NORVASC) 10 MG tablet Take 1 tablet (10 mg total) by mouth daily. 06/02/13  Yes Reyne Dumas, MD  atorvastatin (LIPITOR) 40 MG tablet Take 1 tablet (40 mg total) by mouth daily. 06/02/13  Yes Reyne Dumas, MD  cloNIDine (CATAPRES) 0.2 MG tablet Take 1 tablet (0.2 mg total) by mouth 3 (three) times daily. 06/02/13  Yes Reyne Dumas, MD  clopidogrel (PLAVIX) 75 MG tablet Take 1 tablet (75 mg total) by mouth daily. 06/02/13  Yes Reyne Dumas, MD  fluticasone (FLONASE) 50 MCG/ACT nasal spray Place 1 spray into both nostrils daily as needed for rhinitis or allergies. 06/02/13  Yes Reyne Dumas, MD  furosemide (LASIX) 20 MG tablet Take 1 tablet (20 mg total) by mouth daily. 06/02/13  Yes Reyne Dumas, MD  gabapentin (NEURONTIN) 400 MG capsule Take 1 capsule (400 mg total) by mouth daily. 06/02/13  Yes Reyne Dumas, MD  glimepiride (AMARYL) 4 MG tablet Take 1 tablet (4 mg total) by mouth 2 (two) times daily. 05/11/13  Yes Lorayne Marek, MD  Insulin Aspart Prot & Aspart (NOVOLOG MIX 70/30 FLEXPEN) (70-30) 100 UNIT/ML Pen Inject 70 Units into the skin 2 (two) times daily before a meal. Substitute with a pen. Dispense required syringes and needles one month supply 1 refill 06/21/13  Yes Reyne Dumas, MD  levothyroxine (SYNTHROID, LEVOTHROID) 25 MCG tablet Take 1 tablet (25 mcg total) by mouth daily before breakfast. 05/11/13  Yes Deepak Advani, MD  linagliptin (TRADJENTA) 5 MG TABS tablet Take 1 tablet (5 mg total) by mouth daily. 06/02/13  Yes Reyne Dumas, MD  metoprolol (TOPROL-XL) 200 MG 24 hr tablet Take 0.5 tablets (100 mg total) by mouth daily. 06/02/13  Yes Reyne Dumas, MD  naproxen (NAPROSYN) 500 MG tablet Take 1 tablet (500 mg total) by mouth 2 (two) times daily with a meal. 03/22/13  Yes Deepak Advani, MD  polyethylene glycol powder (GLYCOLAX/MIRALAX) powder Take 17 g by mouth 2 (two) times daily as needed. 06/02/13  Yes Reyne Dumas, MD  traMADol (ULTRAM) 50 MG tablet Take 1 tablet (50 mg total) by mouth every 8 (eight) hours as needed. For pain/Maximum dose= 8 tablets per day 06/02/13  Yes Reyne Dumas, MD  traZODone (DESYREL) 50 MG tablet Take 0.5-1 tablets (25-50 mg total) by mouth at bedtime as needed for sleep. 06/21/13  Yes Reyne Dumas, MD  nitroGLYCERIN (NITROSTAT) 0.4 MG SL tablet Place 0.4 mg under the tongue every 5 (five) minutes as needed. For chest    Historical Provider, MD      Allergies  Allergen Reactions  . Penicillins Cross Reactors Hives    And high fever  . Adhesive [Tape] Rash    bruising     Social History:  reports that she quit smoking about 13 years ago. Her smoking use included Cigarettes. She has a 25 pack-year smoking history. She has never used smokeless tobacco. She reports that she  drinks alcohol. She reports that she does not use illicit drugs.     Family History  Problem Relation Age of Onset  . Diabetes Mother   . Hypertension Mother   . Stroke Mother   . Hypertension Father   . Hypertension Brother   . Hypertension Sister   . Diabetes Sister   . Hyperlipidemia Sister        Physical Exam:  GEN:  Pleasant Obese  58 y.o. African American female examined  and in no acute distress; cooperative with exam Filed Vitals:   08/03/13 2245 08/03/13 2300 08/03/13 2330 08/04/13 0030  BP: 127/75 144/70 131/70 137/68  Pulse: 86 80 78 73  Temp:      Resp: 17 12 16 10   Height:      Weight:      SpO2: 96% 95% 98% 97%   Blood pressure 137/68, pulse 73, temperature 99.6 F (37.6 C), resp. rate 10, height 5\' 10"  (1.778 m), weight 108.863 kg (240 lb), SpO2 97.00%. PSYCH: She is alert and oriented x4; does not appear anxious does not appear depressed; affect is normal HEENT: Normocephalic and Atraumatic, Mucous membranes pink; PERRLA; EOM intact; Fundi:  Benign;  No scleral icterus, Nares: Patent, Oropharynx: Clear, Edentulous, Neck:  FROM, no cervical lymphadenopathy nor thyromegaly or carotid bruit; no JVD; Breasts:: Not examined CHEST WALL: No tenderness CHEST: Normal respiration, clear to auscultation bilaterally HEART: Regular rate and  rhythm; no murmurs rubs or gallops BACK: No kyphosis or scoliosis; no CVA tenderness ABDOMEN: Positive Bowel Sounds, Obese, soft non-tender; no masses.   Rectal Exam: Not done EXTREMITIES: No cyanosis, clubbing or edema; no ulcerations. Genitalia: not examined PULSES: 2+ and symmetric SKIN: Normal hydration no rash or ulceration CNS:  Alert and Oriented X 4, No Focal Deficits.   Vascular: pulses palpable throughout    Labs on Admission:  Basic Metabolic Panel:  Recent Labs Lab 08/03/13 2231  NA 130*  K 4.5  CL 95*  CO2 23  GLUCOSE 526*  BUN 35*  CREATININE 1.73*  CALCIUM 11.1*   Liver Function Tests:  Recent Labs Lab 08/03/13 2231  AST 23  ALT 23  ALKPHOS 117  BILITOT 0.4  PROT 8.0  ALBUMIN 4.1   No results found for this basename: LIPASE, AMYLASE,  in the last 168 hours No results found for this basename: AMMONIA,  in the last 168 hours CBC:  Recent Labs Lab 08/03/13 2231  WBC 5.2  HGB 13.1  HCT 36.6  MCV 92.4  PLT 193   Cardiac Enzymes: No results found for this basename: CKTOTAL, CKMB, CKMBINDEX, TROPONINI,  in the last 168 hours  BNP (last 3 results) No results found for this basename: PROBNP,  in the last 8760 hours CBG:  Recent Labs Lab 08/04/13 0016  GLUCAP 397*    Radiological Exams on Admission: Dg Chest Portable 1 View  08/03/2013   CLINICAL DATA:  Short of breath  EXAM: PORTABLE CHEST - 1 VIEW  COMPARISON:  DG CHEST 2 VIEW dated 01/05/2011  FINDINGS: Mild cardiomegaly. Left midlung subsegmental atelectasis for scar. Lungs otherwise clear. No pneumothorax.  IMPRESSION: No active cardiopulmonary disease.  Mild cardiomegaly.   Electronically Signed   By: Maryclare Bean M.D.   On: 08/03/2013 22:38      EKG: Independently reviewed. Normal Sinus Rhythm Rate 87 No Acute S-T changes    Assessment/Plan:   58 y.o. female with  Principal Problem:   Chest pain Active Problems:   HTN (hypertension), malignant, patent  renal arteries   Diabetes  mellitus with chronic kidney disease   CAD (coronary artery disease)   Unspecified hypothyroidism   CKD 4, baseline creatinine around 1.4   Uncontrolled diabetes mellitus      1.  Chest Pain/ CAD hx-  Telemetry Monitoring,  Cycle Troponins,  Nitropaste, O2 ASA Rx.     2.  Uncontrolled DM2-   Continue 70/30 Insulin BID Tradjenta, and Amaryl Rx,   and add SSi coverage PRN.     3.  HTN- Uncontrolled-  Continue Clonidine, Amlodipine, and IV Hydralazine PRN.     4.   Hypothyroid- Check TSH level.    5.   CKD Stage IV-monitor BUN/Cr.     6.   DVT prophylaxis with Lovenox.        Code Status:   FULL CODE Family Communication:    No Family Present Disposition Plan:       Observation  Time spent:  71 Minutes  [facility] Pager [phone]  If 7PM-7AM, please contact night-coverage [email or web address] Password [email or web address] 08/04/2013, 1:14 AM

## 2013-08-04 NOTE — Discharge Summary (Signed)
Physician Discharge Summary  Patient ID: Brooke Stafford MRN: [id number] DOB/AGE: [birth year] 58 y.o.  Admit date: 08/03/2013 Discharge date: 08/04/2013  Primary Care Physician:  Angelica Chessman, MD  Discharge Diagnoses:    . Chest pain atypical after argument resolved  . Uncontrolled diabetes mellitus . HTN (hypertension), malignant, patent renal arteries . CAD (coronary artery disease) . Unspecified hypothyroidism . CKD 4, baseline creatinine around 1.4 . Atherosclerotic peripheral vascular disease . PVD, LSFA PTA 12/12  Consults: Cardiology, Dr. Ron Parker                  Diabetic coordinator   Recommendations for Outpatient Follow-up:  Please note that Lasix and indomethacin has been discontinued due to AKI on CKD  Amaryl was decreased to 4 mg daily with breakfast, continue tradjenta Patient did not want to be on 70/30 insulin, hence placed on Lantus 70 units daily. Please adjust the regimen according to her blood sugars.   Allergies:   Allergies  Allergen Reactions  . Penicillins Cross Reactors Hives    And high fever  . Adhesive [Tape] Rash    bruising     Discharge Medications:   Medication List    STOP taking these medications       furosemide 20 MG tablet  Commonly known as:  LASIX     Insulin Aspart Prot & Aspart (70-30) 100 UNIT/ML Pen  Commonly known as:  NOVOLOG MIX 70/30 FLEXPEN     naproxen 500 MG tablet  Commonly known as:  NAPROSYN      TAKE these medications       albuterol 108 (90 BASE) MCG/ACT inhaler  Commonly known as:  PROVENTIL HFA;VENTOLIN HFA  Inhale 2 puffs into the lungs every 6 (six) hours as needed. For shortness of breath     allopurinol 300 MG tablet  Commonly known as:  ZYLOPRIM  Take 150 mg by mouth daily.     amLODipine 10 MG tablet  Commonly known as:  NORVASC  Take 1 tablet (10 mg total) by mouth daily.     atorvastatin 40 MG tablet  Commonly known as:  LIPITOR  Take 1 tablet (40 mg total) by mouth daily.     cloNIDine 0.2 MG tablet  Commonly known as:  CATAPRES  Take 1 tablet (0.2 mg total) by mouth 3 (three) times daily.     clopidogrel 75 MG tablet  Commonly known as:  PLAVIX  Take 1 tablet (75 mg total) by mouth daily.     fluticasone 50 MCG/ACT nasal spray  Commonly known as:  FLONASE  Place 1 spray into both nostrils daily as needed for rhinitis or allergies.     gabapentin 400 MG capsule  Commonly known as:  NEURONTIN  Take 1 capsule (400 mg total) by mouth daily.     glimepiride 4 MG tablet  Commonly known as:  AMARYL  Take 1 tablet (4 mg total) by mouth daily with breakfast.     insulin glargine 100 UNIT/ML injection  Commonly known as:  LANTUS  Inject 0.7 mLs (70 Units total) into the skin at bedtime.     Insulin Syringes (Disposable) U-100 1 ML Misc  For Lantus insulin     levothyroxine 25 MCG tablet  Commonly known as:  SYNTHROID, LEVOTHROID  Take 1 tablet (25 mcg total) by mouth daily before breakfast.     linagliptin 5 MG Tabs tablet  Commonly known as:  TRADJENTA  Take 1 tablet (5 mg total) by mouth daily.  metoprolol 200 MG 24 hr tablet  Commonly known as:  TOPROL-XL  Take 0.5 tablets (100 mg total) by mouth daily.     nitroGLYCERIN 0.4 MG SL tablet  Commonly known as:  NITROSTAT  Place 0.4 mg under the tongue every 5 (five) minutes as needed. For chest     polyethylene glycol powder powder  Commonly known as:  GLYCOLAX/MIRALAX  Take 17 g by mouth 2 (two) times daily as needed.     traMADol 50 MG tablet  Commonly known as:  ULTRAM  Take 1 tablet (50 mg total) by mouth every 6 (six) hours as needed for moderate pain.     traZODone 50 MG tablet  Commonly known as:  DESYREL  Take 0.5-1 tablets (25-50 mg total) by mouth at bedtime as needed for sleep.         Brief H and P: For complete details please refer to admission H and P, but in brief Brooke Stafford is a 58 y.o. female with a history of CAD, DM2, HTN who presents to the ED with complaints  of Substernal Chest pain described as chest tightness and pressure without radiation that started at 8 pm after she had an argument. The pain was relieved after she was administered 1 NTG. She was evalauted in the ED and was found to have a negative initial cardia workup and was referred for a cardiac rule out. She also was found to have hyperglycemia and an elevated blood pressure.   Hospital Course:     Chest pain: Atypical, patient was admitted with chest pain after she had an argument at home. Patient has a known history of vascular disease and CABG in 2002. Patient was recommended medical therapy, last catheterization in 2011. Cardiology was consulted and patient was ruled out for acute ACS, 3 sets of cardiac enzymes have been negative. Chest pain has resolved with one nitroglycerin. Cardiology, Dr. Ron Parker did not recommend any further cardiac workup inpatient. Patient is to followup with her outpatient cardiologist, Dr. Gwenlyn Found.  Active Problems: Uncontrolled diabetes mellitus: Patient had not taken her insulin and Amaryl prior to admission which cause hyperglycemia. Diabetic coordinator consult was obtained. Patient was provided teaching for the diabetes. She did not want to be on insulin 7030. Hence she was placed on 70 units of Lantus at bedtime,tradgenta, Amaryl was decreased to 4 mg daily    Unspecified hypothyroidism Synthroid was continued     Mild AKI on CKD 4, baseline creatinine around 1.4 -I have discontinued Lasix and naproxen. Please check bmet at the time of follow up appointment     Day of Discharge BP 149/85  Pulse 74  Temp(Src) 98.5 F (36.9 C) (Oral)  Resp 18  Ht 5\' 10"  (1.778 m)  Wt 109.77 kg (242 lb)  BMI 34.72 kg/m2  SpO2 96%  Physical Exam: General: Alert and awake oriented x3 not in any acute distress. CVS: S1-S2 clear no murmur rubs or gallops Chest: clear to auscultation bilaterally, no wheezing rales or rhonchi Abdomen: obese  soft nontender, nondistended,  normal bowel sounds Extremities: no cyanosis, clubbing or edema noted bilaterally Neuro: Cranial nerves II-XII intact, no focal neurological deficits   The results of significant diagnostics from this hospitalization (including imaging, microbiology, ancillary and laboratory) are listed below for reference.    LAB RESULTS: Basic Metabolic Panel:  Recent Labs Lab 08/03/13 2231 08/04/13 0430  NA 130* 139  K 4.5 4.5  CL 95* 102  CO2 23 23  GLUCOSE 526* 337*  BUN 35* 33*  CREATININE 1.73* 1.73*  CALCIUM 11.1* 11.2*   Liver Function Tests:  Recent Labs Lab 08/03/13 2231  AST 23  ALT 23  ALKPHOS 117  BILITOT 0.4  PROT 8.0  ALBUMIN 4.1   No results found for this basename: LIPASE, AMYLASE,  in the last 168 hours No results found for this basename: AMMONIA,  in the last 168 hours CBC:  Recent Labs Lab 08/03/13 2231 08/04/13 0430  WBC 5.2 5.4  HGB 13.1 12.4  HCT 36.6 34.8*  MCV 92.4 92.8  PLT 193 189   Cardiac Enzymes:  Recent Labs Lab 08/04/13 0430 08/04/13 0940  TROPONINI <0.30 <0.30   BNP: No components found with this basename: POCBNP,  CBG:  Recent Labs Lab 08/04/13 0740 08/04/13 1138  GLUCAP 267* 292*    Significant Diagnostic Studies:  Dg Chest Portable 1 View  08/03/2013   CLINICAL DATA:  Short of breath  EXAM: PORTABLE CHEST - 1 VIEW  COMPARISON:  DG CHEST 2 VIEW dated 01/05/2011  FINDINGS: Mild cardiomegaly. Left midlung subsegmental atelectasis for scar. Lungs otherwise clear. No pneumothorax.  IMPRESSION: No active cardiopulmonary disease.  Mild cardiomegaly.   Electronically Signed   By: Maryclare Bean M.D.   On: 08/03/2013 22:38       Disposition and Follow-up: Discharge Orders   Future Appointments Provider Department Dept Phone   09/21/2013 12:15 PM Lorayne Marek, MD [place] [phone]   Future Orders Complete By Expires   Diet Carb Modified  As directed    Discharge instructions  As directed     Increase activity slowly  As directed        DISPOSITION: Home  DIET: Carb modified   TESTS THAT NEED FOLLOW-UP BMET, hemoglobin A1c  DISCHARGE FOLLOW-UP Follow-up Information   Follow up with Angelica Chessman, MD. Schedule an appointment as soon as possible for a visit in 2 weeks. (for hospital follow-up)    Specialty:  Internal Medicine   Contact information:   [place] [id number] [phone]       Follow up with Lorretta Harp, MD. Schedule an appointment as soon as possible for a visit in 2 weeks. (for hospital follow-up)    Specialty:  Cardiology   Contact information:   [address] [place] [address] [phone]       Time spent on Discharge: 40 minutes  Signed:   Mendel Corning M.D. [facility] 08/04/2013, 12:32 PM Pager: [phone]   **Disclaimer: This note was dictated with voice recognition software. Similar sounding words can inadvertently be transcribed and this note may contain transcription errors which may not have been corrected upon publication of note.**

## 2013-08-04 NOTE — ED Notes (Signed)
Dr. Jenkins at bedside. 

## 2013-08-04 NOTE — Plan of Care (Signed)
Problem: Food- and Nutrition-Related Knowledge Deficit (NB-1.1) Goal: Nutrition education Formal process to instruct or train a patient/client in a skill or to impart knowledge to help patients/clients voluntarily manage or modify food choices and eating behavior to maintain or improve health. Outcome: Completed/Met Date Met:  08/04/13  RD consulted for nutrition education regarding diabetes. Patient reports that she has had nutrition education classes, but wanted to speak with a dietitian in the hospital for a refresher.     Lab Results  Component Value Date    HGBA1C 10.2 06/02/2013    RD provided "Carbohydrate Counting for People with Diabetes" handout from the Academy of [employer]etics. Discussed different food groups and their effects on blood sugar, emphasizing carbohydrate-containing foods. Provided list of carbohydrates and recommended serving sizes of common foods.  Discussed importance of controlled and consistent carbohydrate intake throughout the day. Provided examples of ways to balance meals/snacks and encouraged intake of high-fiber, whole grain complex carbohydrates. Teach back method used.  Expect fair to good compliance.  Body mass index is 34.72 kg/(m^2). Pt meets criteria for obesity, class 1 based on current BMI.  Current diet order is NPO for potential procedure. Labs and medications reviewed. No further nutrition interventions warranted at this time. RD contact information provided. If additional nutrition issues arise, please re-consult RD.  Molli Barrows, RD, LDN, [place] Pager [phone] After Hours Pager [phone]

## 2013-08-04 NOTE — Consult Note (Addendum)
CARDIOLOGY CONSULT NOTE   Patient ID: Brooke Stafford MRN: [id number] DOB/AGE: [birth year] 58 y.o.  Admit Date: 08/03/2013  Primary Physician: Angelica Chessman, MD  Primary Cardiologist  Gwenlyn Found   Clinical Summary Brooke Stafford is a 58 y.o.female. The patient had an argument at home with some chest pain. The pain was relieved with one nitroglycerin. Brooke Stafford came to the emergency room. Glucose was elevated. Brooke Stafford was admitted for diabetes control and cardiac rule out.  Patient has known vascular disease. Brooke Stafford underwent CABG in 2002. Her last catheterization was 2011. Her grafts were patent at that time. No intervention was needed and medical therapy was recommended. By history her ejection fraction is in the 45% range. Brooke Stafford also has significant peripheral arterial disease. Her most recent stent was done to her leg in 2014. Brooke Stafford's had a followup visit with Dr. Gwenlyn Found since that time. That visit was in September, 2014. Brooke Stafford was stable at that time. Her creatinine is 1.7.  The patient tells me that Brooke Stafford has been out of her insulin because pharmacy would not renew it. During this hospitalization it is hopeful that Brooke Stafford can have her meds stabilize for her diabetes.   Allergies  Allergen Reactions  . Penicillins Cross Reactors Hives    And high fever  . Adhesive [Tape] Rash    bruising    Medications Scheduled Medications: . allopurinol  150 mg Oral Daily  . amLODipine  10 mg Oral Daily  . atorvastatin  40 mg Oral Daily  . cloNIDine  0.2 mg Oral TID  . clopidogrel  75 mg Oral Daily  . enoxaparin (LOVENOX) injection  30 mg Subcutaneous Q24H  . gabapentin  400 mg Oral Daily  . glimepiride  4 mg Oral BID AC  . insulin aspart  0-15 Units Subcutaneous TID WC  . insulin aspart  0-5 Units Subcutaneous QHS  . insulin aspart  0-5 Units Subcutaneous QHS  . insulin aspart protamine- aspart  70 Units Subcutaneous BID AC  . levothyroxine  25 mcg Oral QAC breakfast  . linagliptin  5 mg Oral Daily  .  metoprolol  100 mg Oral Daily  . naproxen  500 mg Oral BID WC  . nitroGLYCERIN  1 inch Topical 4 times per day     Infusions: . sodium chloride 1,000 mL (08/03/13 2228)  . sodium chloride    . insulin (NOVOLIN-R) infusion       PRN Medications:  acetaminophen, acetaminophen, albuterol, alum & mag hydroxide-simeth, fluticasone, HYDROmorphone (DILAUDID) injection, nitroGLYCERIN, ondansetron (ZOFRAN) IV, ondansetron, oxyCODONE, polyethylene glycol powder, traMADol, traZODone   Past Medical History  Diagnosis Date  . Asthma   . Anxiety   . GERD (gastroesophageal reflux disease)   . Coronary artery disease 2002    CABG x 6. Cath 5/11- med Rx  . Hypertension   . Peripheral vascular disease 12/12    LSFA PTA  . Anemia   . Hyperlipidemia   . Chronic renal insufficiency, stage II (mild)     followed  by [facility]  . CAD (coronary artery disease) 2002    CABG x 6 2002, cath 2011- med Rx  . Hypothyroid     treated  . Obesity (BMI 35.0-39.9 without comorbidity)   . CHF (congestive heart failure)     "in 2002" (11/26/2012)  . Myocardial infarction 2000; 2002; 2011?  [name] Chronic bronchitis     "q year; in the winter" (11/30/2012)  . Pneumonia     "3 times I think" (  11/30/2012)  . Type II diabetes mellitus   . Migraines     "couple times/year" (11/30/2012)  . Arthritis     "stiff fingers and knees" (11/30/2012)  . Depression     Past Surgical History  Procedure Laterality Date  . Cholecystectomy  1982  . Cesarean section  1978; 1980  . Tubal ligation  1980  . Breast cyst excision Right 1970's  . Coronary artery bypass graft  11/20/2000    x6 LIMA to distal LAD, svg to first diag, svg to ramus intermediate branch and swquential SVG to cir marginal branch, SVG to posterior descending coronary and sequential SVG to first right posterolateral branch  . Cardiac catheterization  2002  . Coronary angioplasty with stent placement  2004; 2012    "I have 2 stents" 11/30/2012)  .  Peripheral arterial stent graft Left     SFA/notes 04/07/2011 (11/30/2012)  . Nm myocar perf wall motion  08/27/2004    negative    Family History  Problem Relation Age of Onset  . Diabetes Mother   . Hypertension Mother   . Stroke Mother   . Hypertension Father   . Hypertension Brother   . Hypertension Sister   . Diabetes Sister   . Hyperlipidemia Sister     Social History Brooke Stafford reports that Brooke Stafford quit smoking about 13 years ago. Her smoking use included Cigarettes. Brooke Stafford has a 25 pack-year smoking history. Brooke Stafford has never used smokeless tobacco. Brooke Stafford reports that Brooke Stafford drinks alcohol.  Review of Systems  Patient denies fever, chills, headache, sweats, rash, change in vision, change in hearing, cough, nausea vomiting, urinary symptoms. All other systems are reviewed and are negative.  Physical Examination Blood pressure 153/78, pulse 78, temperature 98.7 F (37.1 C), temperature source Oral, resp. rate 18, height 5\' 10"  (1.778 m), weight 242 lb (109.77 kg), SpO2 97.00%. No intake or output data in the 24 hours ending 08/04/13 0913  Patient is oriented to person time and place. Affect is normal. Brooke Stafford is able to give a clear history about difficulties obtaining her diabetic medications. Head is atraumatic. Conjunctiva reveal no upper maladies. There is no jugulovenous distention. Carotids reveal no bruits. Lungs are clear. Respiratory effort is nonlabored. Cardiac exam reveals S1 and S2. The abdomen is soft. There is no peripheral edema. There are no musculoskeletal deformities. There are no skin rashes. In general Brooke Stafford is quite stable today.    Prior Cardiac Testing/Procedures  Lab Results  Basic Metabolic Panel:  Recent Labs Lab 08/03/13 2231 08/04/13 0430  NA 130* 139  K 4.5 4.5  CL 95* 102  CO2 23 23  GLUCOSE 526* 337*  BUN 35* 33*  CREATININE 1.73* 1.73*  CALCIUM 11.1* 11.2*    Liver Function Tests:  Recent Labs Lab 08/03/13 2231  AST 23  ALT 23  ALKPHOS  117  BILITOT 0.4  PROT 8.0  ALBUMIN 4.1    CBC:  Recent Labs Lab 08/03/13 2231 08/04/13 0430  WBC 5.2 5.4  HGB 13.1 12.4  HCT 36.6 34.8*  MCV 92.4 92.8  PLT 193 189    Cardiac Enzymes:  Recent Labs Lab 08/04/13 0430  TROPONINI <0.30    BNP: No components found with this basename: POCBNP,    Radiology: Dg Chest Portable 1 View  08/03/2013   CLINICAL DATA:  Short of breath  EXAM: PORTABLE CHEST - 1 VIEW  COMPARISON:  DG CHEST 2 VIEW dated 01/05/2011  FINDINGS: Mild cardiomegaly. Left midlung subsegmental atelectasis for scar.  Lungs otherwise clear. No pneumothorax.  IMPRESSION: No active cardiopulmonary disease.  Mild cardiomegaly.   Electronically Signed   By: Maryclare Bean M.D.   On: 08/03/2013 22:38   ECG:   I have reviewed the current EKGs and her prior EKGs. There is normal sinus rhythm. There is no significant change. There is no acute abnormality.  Telemetry:   I have reviewed telemetry today August 04, 2013. There is normal sinus rhythm.   Impression and Recommendations    Chest pain    The patient has known coronary disease. Brooke Stafford has rare intermittent chest pain at home. Brooke Stafford had pain after an argument yesterday. There was relief with one nitroglycerin. So far her cardiac enzymes are normal. If Brooke Stafford does not have any change in her troponin, further cardiac workup will not be needed in the hospital. Brooke Stafford could be discharged home with outpatient followup with Dr.Berry. I feel Brooke Stafford does not need an echocardiogram or a stress nuclear study in the hospital if her enzymes are normal.     PVD, LSFA PTA 12/12     This was stable when Brooke Stafford saw Dr.Berry last in September, 2014. Brooke Stafford actually had an additional intervention in 2014.    HTN (hypertension), malignant, patent renal arteries   Hx of CABG x 6 2002, cath 2011 patent grafts, medical Rx    CAD (coronary artery disease)     Her last cath was done in 2011. Grafts were patent at that time. Brooke Stafford had no significant obstructive  disease at that time.    CKD 4, baseline creatinine around 1.4    Uncontrolled diabetes mellitus     Diabetes is uncontrolled at this time. This is being treated in the hospital.  Daryel November, MD  08/04/2013, 9:13 AM

## 2013-08-10 ENCOUNTER — Other Ambulatory Visit: Payer: Self-pay | Admitting: Emergency Medicine

## 2013-08-10 MED ORDER — CLONIDINE HCL 0.2 MG PO TABS: 0.2000 mg | ORAL_TABLET | Freq: Three times a day (TID) | ORAL | Status: DC

## 2013-08-19 ENCOUNTER — Telehealth: Payer: Self-pay | Admitting: Cardiovascular Disease

## 2013-08-20 NOTE — Telephone Encounter (Signed)
Closed encounter °

## 2013-08-30 ENCOUNTER — Ambulatory Visit (INDEPENDENT_AMBULATORY_CARE_PROVIDER_SITE_OTHER): Payer: Medicare Other | Admitting: Cardiology

## 2013-08-30 ENCOUNTER — Ambulatory Visit: Payer: Medicare Other | Admitting: Internal Medicine

## 2013-08-30 ENCOUNTER — Encounter: Payer: Self-pay | Admitting: Cardiology

## 2013-08-30 ENCOUNTER — Other Ambulatory Visit: Payer: Self-pay | Admitting: *Deleted

## 2013-08-30 VITALS — BP 150/72 | HR 61 | Ht 70.0 in | Wt 249.0 lb

## 2013-08-30 DIAGNOSIS — R079 Chest pain, unspecified: Secondary | ICD-10-CM

## 2013-08-30 DIAGNOSIS — I2510 Atherosclerotic heart disease of native coronary artery without angina pectoris: Secondary | ICD-10-CM

## 2013-08-30 DIAGNOSIS — I2589 Other forms of chronic ischemic heart disease: Secondary | ICD-10-CM

## 2013-08-30 DIAGNOSIS — I10 Essential (primary) hypertension: Secondary | ICD-10-CM | POA: Diagnosis not present

## 2013-08-30 DIAGNOSIS — E785 Hyperlipidemia, unspecified: Secondary | ICD-10-CM

## 2013-08-30 DIAGNOSIS — N189 Chronic kidney disease, unspecified: Secondary | ICD-10-CM

## 2013-08-30 DIAGNOSIS — I255 Ischemic cardiomyopathy: Secondary | ICD-10-CM

## 2013-08-30 DIAGNOSIS — E1122 Type 2 diabetes mellitus with diabetic chronic kidney disease: Secondary | ICD-10-CM

## 2013-08-30 DIAGNOSIS — E1129 Type 2 diabetes mellitus with other diabetic kidney complication: Secondary | ICD-10-CM

## 2013-08-30 MED ORDER — ISOSORBIDE MONONITRATE ER 30 MG PO TB24: 30.0000 mg | ORAL_TABLET | Freq: Every day | ORAL | Status: DC

## 2013-08-30 MED ORDER — EZETIMIBE 10 MG PO TABS: 10.0000 mg | ORAL_TABLET | Freq: Every day | ORAL | Status: DC

## 2013-08-30 MED ORDER — NITROGLYCERIN 0.4 MG SL SUBL: 0.4000 mg | SUBLINGUAL_TABLET | SUBLINGUAL | Status: DC | PRN

## 2013-08-30 NOTE — Assessment & Plan Note (Signed)
She continues to have intermittent anginal like pain with exertional activities. Her EKG shows inferior T-wave abnormalities, although not new. She is on Plavix, a beta blocker and statin. Her heart rate limits further titration of her beta blocker. Her blood pressure is moderately elevated at 150/72. We will add 30 mg of Imdur daily. Also order sublingual nitroglycerin to be used when necessary. She has not had an ischemic eval since her last heart catheterization in 2011. With her recent symptoms as well as multiple risk factors (known CAD, poorly controlled diabetes, hypertension and hyperlipidemia) she needs to be reevaluated. We'll plan for a nuclear stress test as well as a 2-D echocardiogram.

## 2013-08-30 NOTE — Assessment & Plan Note (Signed)
Moderately hypertensive with blood pressure of 150/72. Will continue amlodipine, clonidine, metoprolol. Will add 30 mg of Imdur were daily, in the setting of recurrent chest pain concerning for angina.

## 2013-08-30 NOTE — Assessment & Plan Note (Signed)
Managed by PCP

## 2013-08-30 NOTE — Progress Notes (Signed)
Patient ID: Brooke Stafford, female   DOB: 07/05/1955, 58 y.o.   MRN: [id number]    08/30/2013 ANALLELI Stafford   [birth year]  [id number]  Primary Physicia Angelica Chessman, MD Primary Cardiologist: Dr. Gwenlyn Found  Brooke Stafford presents to clinic today for post hospital followup, after recent admission for chest pain.  HPI:  The patient is a 58 year old female. Dr. Sallyanne Kuster follows her cardiac issues however Dr. Gwenlyn Found follows her peripheral vascular disease. She has a past medical history significant for severe type 2 diabetes mellitus requiring insulin therapy, systemic hypertension, hyperlipidemia, obesity, PVD S/P LSFA PTA  initally in 2012 and again in 2014, coronary artery disease, S/P CABG x 6 in 2002. Her last cardiac catheterization was done in July 2011 when she presented with sepsis and medication non compliance and had a NSTEMI from demand ischemia. Cath then showed a patent LIMA to LAD, patent SVG to diagonal, and patent sequential SVG to branches of the right coronary artery. The plan was for medical Rx. She also has a mild ischemic cardiomyopathy with a left ventricular ejection fraction of 45% to 50% by echo 5/11.    She presented to [facility] on 08/03/13, with a complaint of chest pain that started after an argument at home. The pain felt similar to her past anginal pain. It was substernal and pressure-like and was relieved with sublingual nitroglycerin x1. In the ER her glucose was elevated. Her EKG was unremarkable. She was admitted for diabetes control and cardiac rule out. She ultimately ruled out for acute coronary syndrome with 3 sets of negative cardiac enzymes. She was seen by Dr. Ron Parker and he felt that no further inpatient cardiac workup was indicated. After her diabetes was better controlled, she was discharged home from the hospital was instructed to followup in clinic.   She presents to clinic today for post hospital follow-up. She states that she continues to have  intemittent chest pain with exertional activities, mainly with household chores. She was not given a prescription of sublingual nitroglycerin before discharge. She also notes mild dyspnea on exertion and also complains of 2-3 pillow orthopnea and PND. However, she denies any significant LE edema.  She reports medication compliance with the majority of her medications except for Synthroid which she says that she's been out of for several weeks now. She has attempted to contact her PCP for refills but this has still not been called into her pharmacy. She has been keeping a regular check of her blood sugars at home and states that this has improved now that her diabetes medicines have been titrated.  Current Outpatient Prescriptions  Medication Sig Dispense Refill  . albuterol (PROVENTIL HFA;VENTOLIN HFA) 108 (90 BASE) MCG/ACT inhaler Inhale 2 puffs into the lungs every 6 (six) hours as needed. For shortness of breath  1 Inhaler  3  . allopurinol (ZYLOPRIM) 300 MG tablet Take 150 mg by mouth daily.      [name] amLODipine (NORVASC) 10 MG tablet Take 1 tablet (10 mg total) by mouth daily.  30 tablet  2  . atorvastatin (LIPITOR) 40 MG tablet Take 1 tablet (40 mg total) by mouth daily.  90 tablet  3  . cloNIDine (CATAPRES) 0.2 MG tablet Take 1 tablet (0.2 mg total) by mouth 3 (three) times daily.  90 tablet  1  . clopidogrel (PLAVIX) 75 MG tablet Take 1 tablet (75 mg total) by mouth daily.  30 tablet  6  . fluticasone (FLONASE) 50 MCG/ACT nasal spray Place 1  spray into both nostrils daily as needed for rhinitis or allergies.  16 g  2  . gabapentin (NEURONTIN) 400 MG capsule Take 1 capsule (400 mg total) by mouth daily.  90 capsule  3  . glimepiride (AMARYL) 4 MG tablet Take 1 tablet (4 mg total) by mouth daily with breakfast.  30 tablet  4  . insulin glargine (LANTUS) 100 UNIT/ML injection Inject 0.7 mLs (70 Units total) into the skin at bedtime.  10 mL  5  . Insulin Syringes, Disposable, U-100 1 ML MISC For  Lantus insulin  100 each  3  . levothyroxine (SYNTHROID, LEVOTHROID) 25 MCG tablet Take 1 tablet (25 mcg total) by mouth daily before breakfast.  30 tablet  1  . linagliptin (TRADJENTA) 5 MG TABS tablet Take 1 tablet (5 mg total) by mouth daily.  30 tablet  3  . metoprolol (TOPROL-XL) 200 MG 24 hr tablet Take 0.5 tablets (100 mg total) by mouth daily.  30 tablet  3  . polyethylene glycol powder (GLYCOLAX/MIRALAX) powder Take 17 g by mouth 2 (two) times daily as needed.  3350 g  3  . traMADol (ULTRAM) 50 MG tablet Take 1 tablet (50 mg total) by mouth every 6 (six) hours as needed for moderate pain.  60 tablet  0  . traZODone (DESYREL) 50 MG tablet Take 0.5-1 tablets (25-50 mg total) by mouth at bedtime as needed for sleep.  60 tablet  3  . ezetimibe (ZETIA) 10 MG tablet Take 1 tablet (10 mg total) by mouth daily.  90 tablet  3  . isosorbide mononitrate (IMDUR) 30 MG 24 hr tablet Take 1 tablet (30 mg total) by mouth daily.  90 tablet  3  . nitroGLYCERIN (NITROSTAT) 0.4 MG SL tablet Place 1 tablet (0.4 mg total) under the tongue every 5 (five) minutes as needed. For chest  30 tablet  6   No current facility-administered medications for this visit.    Allergies  Allergen Reactions  . Penicillins Cross Reactors Hives    And high fever  . Adhesive [Tape] Rash    bruising    History   Social History  . Marital Status: Divorced    Spouse Name: N/A    Number of Children: N/A  . Years of Education: N/A   Occupational History  . Not on file.   Social History Main Topics  . Smoking status: Former Smoker -- 1.00 packs/day for 25 years    Types: Cigarettes    Quit date: 04/04/2000  . Smokeless tobacco: Never Used  . Alcohol Use: Yes     Comment: 08/04/2013  "have a glass of red wine on my birthday q yr; that's it"  . Drug Use: No  . Sexual Activity: No   Other Topics Concern  . Not on file   Social History Narrative  . No narrative on file     Review of Systems: General: negative  for chills, fever, night sweats or weight changes.  Cardiovascular: negative for chest pain, dyspnea on exertion, edema, orthopnea, palpitations, paroxysmal nocturnal dyspnea or shortness of breath Dermatological: negative for rash Respiratory: negative for cough or wheezing Urologic: negative for hematuria Abdominal: negative for nausea, vomiting, diarrhea, bright red blood per rectum, melena, or hematemesis Neurologic: negative for visual changes, syncope, or dizziness All other systems reviewed and are otherwise negative except as noted above.    Blood pressure 150/72, pulse 61, height 5\' 10"  (1.778 m), weight 249 lb (112.946 kg).  General appearance: alert,  cooperative and no distress Neck: no carotid bruit and no JVD Lungs: clear to auscultation bilaterally Heart: regular rate and rhythm, S1, S2 normal, no murmur, click, rub or gallop Extremities: no [name] Pulses: 2+ and symmetric Skin: warm and dry Neurologic: Grossly normal  EKG NSR, HR 61 bpm. Twave abnormalities in inferior leads (old)  ASSESSMENT AND PLAN:   CAD (coronary artery disease) She continues to have intermittent anginal like pain with exertional activities. Her EKG shows inferior T-wave abnormalities, although not new. She is on Plavix, a beta blocker and statin. Her heart rate limits further titration of her beta blocker. Her blood pressure is moderately elevated at 150/72. We will add 30 mg of Imdur daily. Also order sublingual nitroglycerin to be used when necessary. She has not had an ischemic eval since her last heart catheterization in 2011. With her recent symptoms as well as multiple risk factors (known CAD, poorly controlled diabetes, hypertension and hyperlipidemia) she needs to be reevaluated. Will plan for a nuclear stress test as well as a 2-D echocardiogram.  Ischemic cardiomyopathy Her last echocardiogram was in 2011, which demonstrated an ejection fraction of 45-50%. She complains of recent symptoms that  are concerning for orthopnea and PND. Subsequently she sleeps with 2-3 pillows each night. On physical exam today she appears euvolemic. However given recent symptoms, we'll recheck another 2-D echocardiogram to make sure that systolic function has not worsened since her last evaluation 4 years ago. Since she appears to be euvolemic on physical exam today, so we will hold off on initiation of a diuretic. However if EF is still low or has worsened will need to consider adding a diuretic. She is not a candidate for an ACE/ARB given her CKD.  Essential hypertension, benign Moderately hypertensive with blood pressure of 150/72. Will continue amlodipine, clonidine and metoprolol. Will add 30 mg of Imdur daily, in the setting of recurrent chest pain concerning for angina.  Dyslipidemia Her most recent lipid panel during her last hospitalization revealed elevated triglycerides at 230, low HDL at 230, high VLDL at 46. LDL was elevated at 99. She is on 40 mg of Lipitor daily. Ideally, given her history of CAD, would prefer for her LDL to be less than 70. Will initiate 10 mg of Zeita to be taken along with her Lipitor.  Diabetes mellitus with chronic kidney disease Managed by PCP.     PLAN   In light of her recent symptoms, we have decided to evaluate for ischemia with a nuclear stress test and will also order a 2-D echocardiogram. 30 mg of Imdur was added to her medical regimen, in the setting of recent chest pain. 10 mg of Zeitia was also added in an effort to better improve her LDL. She is to followup in 1 to 2 weeks, after both tests are complete for reevaluation. PRN sublingual nitroglycerin was also ordered and she was provided detailed introductions on the proper use of nitroglycerin and when to call 911. She verbalized understanding.  Brittainy SimmonsPA-C 08/30/2013 5:53 PM

## 2013-08-30 NOTE — Patient Instructions (Signed)
1. We are scheduling an Echo and we will call you with the results  2.We are scheduling a Lexiscan and we will call you with the results  3.Your physician recommends that you schedule a follow-up appointment in:  2 weeks with Britany PA  4. Take your Zetia and Imdur as prescribed  5. Continue all other meds as you are taking them now

## 2013-08-30 NOTE — Assessment & Plan Note (Signed)
Her most recent lipid panel during her last hospitalization revealed elevated triglycerides at 230, low HDL at 305, high VLDL at 46. LDL was elevated at 99. She is on 40 mg of Lipitor daily. Ideally, given her history of CAD, would prefer for her LDL to be less than 70. Will initiate 10 mg of Zeita to be taken along with her Lipitor.

## 2013-08-30 NOTE — Assessment & Plan Note (Signed)
Her last echocardiogram was in 2011, which demonstrated an ejection fraction of 45-50%. She complains of recent symptoms that are concerning for orthopnea and PND. Subsequently she sleeps with 2-3 pillows each night. On physical exam today she appears euvolemic. However given recent symptoms, we'll recheck another 2-D echocardiogram to make sure that systolic function has worsened since her last evaluation 4 years ago. Since she appears to be euvolemic on physical exam today we will hold off on initiation of a diuretic. However if EF is still low or has worsened will need to add a diuretic. We'll also need to consider adding an ACE inhibitor or ARB if systolic function is significantly reduced.

## 2013-09-02 ENCOUNTER — Ambulatory Visit (HOSPITAL_COMMUNITY)
Admission: RE | Admit: 2013-09-02 | Discharge: 2013-09-02 | Disposition: A | Discharge status: Home / Self Care | Payer: Medicare Other | Source: Ambulatory Visit | Attending: Cardiology | Admitting: Cardiology

## 2013-09-02 DIAGNOSIS — I517 Cardiomegaly: Secondary | ICD-10-CM | POA: Diagnosis not present

## 2013-09-02 DIAGNOSIS — R079 Chest pain, unspecified: Secondary | ICD-10-CM | POA: Diagnosis not present

## 2013-09-02 DIAGNOSIS — R0609 Other forms of dyspnea: Secondary | ICD-10-CM | POA: Diagnosis not present

## 2013-09-02 DIAGNOSIS — R0989 Other specified symptoms and signs involving the circulatory and respiratory systems: Secondary | ICD-10-CM | POA: Insufficient documentation

## 2013-09-02 NOTE — Progress Notes (Signed)
2D Echo Performed 09/02/2013    Marygrace Drought, RCS

## 2013-09-03 ENCOUNTER — Telehealth (HOSPITAL_COMMUNITY): Payer: Self-pay

## 2013-09-07 ENCOUNTER — Ambulatory Visit (HOSPITAL_COMMUNITY)
Admission: RE | Admit: 2013-09-07 | Discharge: 2013-09-07 | Disposition: A | Discharge status: Home / Self Care | Payer: Medicare Other | Source: Ambulatory Visit | Attending: Cardiovascular Disease | Admitting: Cardiovascular Disease

## 2013-09-07 ENCOUNTER — Telehealth (HOSPITAL_COMMUNITY): Payer: Self-pay

## 2013-09-07 DIAGNOSIS — R0989 Other specified symptoms and signs involving the circulatory and respiratory systems: Secondary | ICD-10-CM | POA: Insufficient documentation

## 2013-09-07 DIAGNOSIS — R5383 Other fatigue: Secondary | ICD-10-CM | POA: Diagnosis not present

## 2013-09-07 DIAGNOSIS — R42 Dizziness and giddiness: Secondary | ICD-10-CM | POA: Diagnosis not present

## 2013-09-07 DIAGNOSIS — R5381 Other malaise: Secondary | ICD-10-CM | POA: Diagnosis not present

## 2013-09-07 DIAGNOSIS — R0609 Other forms of dyspnea: Secondary | ICD-10-CM | POA: Insufficient documentation

## 2013-09-07 DIAGNOSIS — R002 Palpitations: Secondary | ICD-10-CM | POA: Insufficient documentation

## 2013-09-07 DIAGNOSIS — R079 Chest pain, unspecified: Secondary | ICD-10-CM | POA: Diagnosis not present

## 2013-09-07 MED ORDER — TECHNETIUM TC 99M SESTAMIBI GENERIC - CARDIOLITE
10.0000 | Freq: Once | INTRAVENOUS | Status: AC | PRN
  Administered 2013-09-07: 10 via INTRAVENOUS

## 2013-09-07 MED ORDER — REGADENOSON 0.4 MG/5ML IV SOLN
0.4000 mg | Freq: Once | INTRAVENOUS | Status: AC
  Administered 2013-09-07: 0.4 mg via INTRAVENOUS

## 2013-09-07 MED ORDER — AMINOPHYLLINE 25 MG/ML IV SOLN
75.0000 mg | Freq: Once | INTRAVENOUS | Status: AC
  Administered 2013-09-07: 75 mg via INTRAVENOUS

## 2013-09-07 MED ORDER — TECHNETIUM TC 99M SESTAMIBI GENERIC - CARDIOLITE
30.0000 | Freq: Once | INTRAVENOUS | Status: AC | PRN
  Administered 2013-09-07: 30 via INTRAVENOUS

## 2013-09-07 NOTE — Procedures (Addendum)
[facility] [address] [place] [id number]  Cardiology Nuclear Med Study  Brooke Stafford is a 58 y.o. female     MRN : [id number]     DOB: 1955-07-02  Procedure Date: 09/07/2013  Nuclear Med Background Indication for Stress Test:  Graft Patency and [place] [facility] History:  Asthma and CAD;MI-10/2009;CABG X6-2002;STENT/PTCA;CKD;Last NUC MPI on 09/25/2012-nonischemic;EF=62% Cardiac Risk Factors: Family History - CAD, History of Smoking, Hypertension, IDDM Type 2, Lipids, Obesity and PVD  Symptoms:  Chest Pain, Dizziness, DOE, Fatigue and Palpitations   Nuclear Pre-Procedure Caffeine/Decaff Intake:  12:00am NPO After: 10am   IV Site: R Antecubital  IV 0.9% NS with Angio Cath:  22g  Chest Size (in):  n/a IV Started by: Rolene Course, RN  Height: 5\' 10"  (1.778 m)  Cup Size: C  BMI:  Body mass index is 35.73 kg/(m^2). Weight:  249 lb (112.946 kg)   Tech Comments:  n/a    Nuclear Med Study 1 or 2 day study: 1 day  Stress Test Type:  [place] Provider:  Quay Burow, MD   Resting Radionuclide: Technetium 71m Sestamibi  Resting Radionuclide Dose: 9.9 mCi   Stress Radionuclide:  Technetium 14m Sestamibi  Stress Radionuclide Dose: 31.9 mCi           Stress Protocol Rest HR: 71 Stress HR: 96  Rest BP: 148/91 Stress BP: 197/101  Exercise Time (min): n/a METS: n/a          Dose of Adenosine (mg):  n/a Dose of Lexiscan: 0.4 mg  Dose of Atropine (mg): n/a Dose of Dobutamine: n/a mcg/kg/min (at max HR)  Stress Test Technologist: Mellody Memos, CCT Nuclear Technologist: Otho Perl, CNMT   Rest Procedure:  Myocardial perfusion imaging was performed at rest 45 minutes following the intravenous administration of Technetium 72m Sestamibi. Stress Procedure:  The patient received IV Lexiscan 0.4 mg over 15-seconds.  Technetium 80m Sestamibi injected IV at 30-seconds.  Patient experienced  extreme shortness of breath and was given 75 mg of Aminophylline IV at 5 minutes. There were no significant changes with Lexiscan.  Quantitative spect images were obtained after a 45 minute delay.  Transient Ischemic Dilatation (Normal <1.22):  1.03 Lung/Heart Ratio (Normal <0.45):  0.28 QGS EDV:  102 ml QGS ESV:  44 ml LV Ejection Fraction: 57%        Rest ECG: NSR with non-specific ST-T wave changes  Stress ECG: No significant change from baseline ECG  QPS Raw Data Images:  Normal; no motion artifact; normal heart/lung ratio. Stress Images:  There is decreased uptake in the inferior wall. Rest Images:  There is decreased uptake in the inferior wall. Subtraction (SDS):  These findings are consistent with ischemia.  Impression Exercise Capacity:  Lexiscan with no exercise. BP Response:  Normal blood pressure response. Clinical Symptoms:  No significant symptoms noted. ECG Impression:  No significant ST segment change suggestive of ischemia. Comparison with Prior Nuclear Study: No images to compare  Overall Impression:  Low risk stress nuclear study Mild inferior ischemia VS diaphragmatic attenuation. ROV with Dr. Gwenlyn Found.  LV Wall Motion:  NL LV Function; NL Wall Motion   Lorretta Harp, MD  09/07/2013 5:09 PM

## 2013-09-09 ENCOUNTER — Other Ambulatory Visit: Payer: Self-pay | Admitting: *Deleted

## 2013-09-09 DIAGNOSIS — E039 Hypothyroidism, unspecified: Secondary | ICD-10-CM

## 2013-09-09 MED ORDER — LEVOTHYROXINE SODIUM 25 MCG PO TABS: 25.0000 ug | ORAL_TABLET | Freq: Every day | ORAL | Status: DC

## 2013-09-10 ENCOUNTER — Telehealth: Payer: Self-pay | Admitting: Cardiovascular Disease

## 2013-09-17 NOTE — Telephone Encounter (Signed)
Closed encounter °

## 2013-09-21 ENCOUNTER — Ambulatory Visit: Payer: Medicare Other | Attending: Internal Medicine | Admitting: Internal Medicine

## 2013-09-21 ENCOUNTER — Encounter: Payer: Self-pay | Admitting: Internal Medicine

## 2013-09-21 VITALS — BP 130/70 | HR 69 | Temp 98.9°F | Resp 16

## 2013-09-21 DIAGNOSIS — I10 Essential (primary) hypertension: Secondary | ICD-10-CM

## 2013-09-21 DIAGNOSIS — F329 Major depressive disorder, single episode, unspecified: Secondary | ICD-10-CM | POA: Insufficient documentation

## 2013-09-21 DIAGNOSIS — I509 Heart failure, unspecified: Secondary | ICD-10-CM | POA: Diagnosis not present

## 2013-09-21 DIAGNOSIS — IMO0001 Reserved for inherently not codable concepts without codable children: Secondary | ICD-10-CM | POA: Diagnosis not present

## 2013-09-21 DIAGNOSIS — E119 Type 2 diabetes mellitus without complications: Secondary | ICD-10-CM

## 2013-09-21 DIAGNOSIS — I739 Peripheral vascular disease, unspecified: Secondary | ICD-10-CM | POA: Diagnosis not present

## 2013-09-21 DIAGNOSIS — Z6835 Body mass index (BMI) 35.0-35.9, adult: Secondary | ICD-10-CM | POA: Diagnosis not present

## 2013-09-21 DIAGNOSIS — Z9861 Coronary angioplasty status: Secondary | ICD-10-CM | POA: Diagnosis not present

## 2013-09-21 DIAGNOSIS — J45909 Unspecified asthma, uncomplicated: Secondary | ICD-10-CM | POA: Diagnosis not present

## 2013-09-21 DIAGNOSIS — L723 Sebaceous cyst: Secondary | ICD-10-CM | POA: Insufficient documentation

## 2013-09-21 DIAGNOSIS — E039 Hypothyroidism, unspecified: Secondary | ICD-10-CM

## 2013-09-21 DIAGNOSIS — M109 Gout, unspecified: Secondary | ICD-10-CM | POA: Diagnosis not present

## 2013-09-21 DIAGNOSIS — E669 Obesity, unspecified: Secondary | ICD-10-CM | POA: Insufficient documentation

## 2013-09-21 DIAGNOSIS — I252 Old myocardial infarction: Secondary | ICD-10-CM | POA: Diagnosis not present

## 2013-09-21 DIAGNOSIS — J302 Other seasonal allergic rhinitis: Secondary | ICD-10-CM

## 2013-09-21 DIAGNOSIS — Z794 Long term (current) use of insulin: Secondary | ICD-10-CM | POA: Diagnosis not present

## 2013-09-21 DIAGNOSIS — E785 Hyperlipidemia, unspecified: Secondary | ICD-10-CM | POA: Diagnosis not present

## 2013-09-21 DIAGNOSIS — Z951 Presence of aortocoronary bypass graft: Secondary | ICD-10-CM | POA: Insufficient documentation

## 2013-09-21 DIAGNOSIS — F3289 Other specified depressive episodes: Secondary | ICD-10-CM | POA: Insufficient documentation

## 2013-09-21 DIAGNOSIS — Z79899 Other long term (current) drug therapy: Secondary | ICD-10-CM | POA: Insufficient documentation

## 2013-09-21 DIAGNOSIS — I129 Hypertensive chronic kidney disease with stage 1 through stage 4 chronic kidney disease, or unspecified chronic kidney disease: Secondary | ICD-10-CM | POA: Diagnosis not present

## 2013-09-21 DIAGNOSIS — I2510 Atherosclerotic heart disease of native coronary artery without angina pectoris: Secondary | ICD-10-CM | POA: Diagnosis not present

## 2013-09-21 DIAGNOSIS — N182 Chronic kidney disease, stage 2 (mild): Secondary | ICD-10-CM | POA: Insufficient documentation

## 2013-09-21 DIAGNOSIS — L720 Epidermal cyst: Secondary | ICD-10-CM

## 2013-09-21 DIAGNOSIS — J309 Allergic rhinitis, unspecified: Secondary | ICD-10-CM

## 2013-09-21 DIAGNOSIS — E1165 Type 2 diabetes mellitus with hyperglycemia: Principal | ICD-10-CM

## 2013-09-21 LAB — GLUCOSE, POCT (MANUAL RESULT ENTRY): POC Glucose: 212 mg/dl — AB (ref 70–99)

## 2013-09-21 MED ORDER — LEVOTHYROXINE SODIUM 25 MCG PO TABS: 25.0000 ug | ORAL_TABLET | Freq: Every day | ORAL | Status: DC

## 2013-09-21 MED ORDER — LORATADINE 10 MG PO TABS: 10.0000 mg | ORAL_TABLET | Freq: Every day | ORAL | Status: DC

## 2013-09-21 NOTE — Progress Notes (Signed)
Patient here for follow up on her DM 

## 2013-09-21 NOTE — Patient Instructions (Signed)
DASH Diet  The DASH diet stands for "Dietary Approaches to Stop Hypertension." It is a healthy eating plan that has been shown to reduce high blood pressure (hypertension) in as little as 14 days, while also possibly providing other significant health benefits. These other health benefits include reducing the risk of breast cancer after menopause and reducing the risk of type 2 diabetes, heart disease, colon cancer, and stroke. Health benefits also include weight loss and slowing kidney failure in patients with chronic kidney disease.   DIET GUIDELINES  · Limit salt (sodium). Your diet should contain less than 1500 mg of sodium daily.  · Limit refined or processed carbohydrates. Your diet should include mostly whole grains. Desserts and added sugars should be used sparingly.  · Include small amounts of heart-healthy fats. These types of fats include nuts, oils, and tub margarine. Limit saturated and trans fats. These fats have been shown to be harmful in the body.  CHOOSING FOODS   The following food groups are based on a 2000 calorie diet. See your Registered Dietitian for individual calorie needs.  Grains and Grain Products (6 to 8 servings daily)  · Eat More Often: Whole-wheat bread, brown rice, whole-grain or wheat pasta, quinoa, popcorn without added fat or salt (air popped).  · Eat Less Often: White bread, white pasta, white rice, cornbread.  Vegetables (4 to 5 servings daily)  · Eat More Often: Fresh, frozen, and canned vegetables. Vegetables may be raw, steamed, roasted, or grilled with a minimal amount of fat.  · Eat Less Often/Avoid: Creamed or fried vegetables. Vegetables in a cheese sauce.  Fruit (4 to 5 servings daily)  · Eat More Often: All fresh, canned (in natural juice), or frozen fruits. Dried fruits without added sugar. One hundred percent fruit juice (½ cup [237 mL] daily).  · Eat Less Often: Dried fruits with added sugar. Canned fruit in light or heavy syrup.  Lean Meats, Fish, and Poultry (2  servings or less daily. One serving is 3 to 4 oz [85-114 g]).  · Eat More Often: Ninety percent or leaner ground beef, tenderloin, sirloin. Round cuts of beef, chicken breast, turkey breast. All fish. Grill, bake, or broil your meat. Nothing should be fried.  · Eat Less Often/Avoid: Fatty cuts of meat, turkey, or chicken leg, thigh, or wing. Fried cuts of meat or fish.  Dairy (2 to 3 servings)  · Eat More Often: Low-fat or fat-free milk, low-fat plain or light yogurt, reduced-fat or part-skim cheese.  · Eat Less Often/Avoid: Milk (whole, 2%). Whole milk yogurt. Full-fat cheeses.  Nuts, Seeds, and Legumes (4 to 5 servings per week)  · Eat More Often: All without added salt.  · Eat Less Often/Avoid: Salted nuts and seeds, canned beans with added salt.  Fats and Sweets (limited)  · Eat More Often: Vegetable oils, tub margarines without trans fats, sugar-free gelatin. Mayonnaise and salad dressings.  · Eat Less Often/Avoid: Coconut oils, palm oils, butter, stick margarine, cream, half and half, cookies, candy, pie.  FOR MORE INFORMATION  The Dash Diet Eating Plan: www.dashdiet.org  Document Released: 03/21/2011 Document Revised: 06/24/2011 Document Reviewed: 03/21/2011  ExitCare® Patient Information ©2014 [employer].

## 2013-09-21 NOTE — Progress Notes (Signed)
MRN: [id number] Name: Brooke Stafford  Sex: female Age: 58 y.o. DOB: Aug 03, 1955  Allergies: Penicillins cross reactors and Adhesive  Chief Complaint  Patient presents with  . Follow-up    DM    HPI: Patient is 58 y.o. female who has history of diabetes, hypothyroidism hypertension hyperlipidemia comes today for followup, as per patient her blood sugar has been running high she is taking Lantus NovoLog, Amaryl and tradjenta, as per patient she missed her dose of Lantus last night, she denies any hypoglycemic symptoms, she also history of hypothyroidism and for the last 2-3 months she has not taken her thyroid medication she recently got the medication prescription filled and has started taking it, she also reported to have noticed lump her on her right side of face in the temporal area for the last more than 1 year as per patient she was prescribed antibiotic in the past which didn't help denies any fever chills any discharge.  Past Medical History  Diagnosis Date  . Asthma   . Anxiety   . GERD (gastroesophageal reflux disease)   . Coronary artery disease 2002    CABG x 6. Cath 5/11- med Rx  . Hypertension   . Peripheral vascular disease 12/12    LSFA PTA  . Anemia   . Hyperlipidemia   . Chronic renal insufficiency, stage II (mild)     followed  by [facility]  . CAD (coronary artery disease) 2002    CABG x 6 2002, cath 2011- med Rx  . Hypothyroid     treated  . Obesity (BMI 35.0-39.9 without comorbidity)   . CHF (congestive heart failure)     "in 2002" (11/26/2012)  . Myocardial infarction 2000; 2002; 2011?  [name] Chronic bronchitis     "q year; in the winter" (08/04/2013)  . Type II diabetes mellitus   . Depression   . Pneumonia     "3 times I think" (08/04/2013)  . History of blood transfusion 2002    "when I had OHS"  . Migraines     "couple times/year" (08/04/2013)  . Headache     "~ q week" (08/04/2013)  . Arthritis     "stiff fingers and knees" (08/04/2013)  .  Gout     "right big toe"    Past Surgical History  Procedure Laterality Date  . Cholecystectomy  1982  . Cesarean section  1978; 1980  . Tubal ligation  1980  . Breast cyst excision Right 1970's  . Coronary artery bypass graft  11/20/2000    x6 LIMA to distal LAD, svg to first diag, svg to ramus intermediate branch and swquential SVG to cir marginal branch, SVG to posterior descending coronary and sequential SVG to first right posterolateral branch  . Cardiac catheterization  2002  . Coronary angioplasty with stent placement  2004; 2012    "I have 2 stents" (08/04/2013)  . Peripheral arterial stent graft Left     SFA/notes 04/07/2011 (11/30/2012)  . Nm myocar perf wall motion  08/27/2004    negative  . Appendectomy  1980      Medication List       This list is accurate as of: 09/21/13 12:48 PM.  Always use your most recent med list.               albuterol 108 (90 BASE) MCG/ACT inhaler  Commonly known as:  PROVENTIL HFA;VENTOLIN HFA  Inhale 2 puffs into the lungs every 6 (six) hours as needed.  For shortness of breath     allopurinol 300 MG tablet  Commonly known as:  ZYLOPRIM  Take 150 mg by mouth daily.     amLODipine 10 MG tablet  Commonly known as:  NORVASC  Take 1 tablet (10 mg total) by mouth daily.     atorvastatin 40 MG tablet  Commonly known as:  LIPITOR  Take 1 tablet (40 mg total) by mouth daily.     cloNIDine 0.2 MG tablet  Commonly known as:  CATAPRES  Take 1 tablet (0.2 mg total) by mouth 3 (three) times daily.     clopidogrel 75 MG tablet  Commonly known as:  PLAVIX  Take 1 tablet (75 mg total) by mouth daily.     ezetimibe 10 MG tablet  Commonly known as:  ZETIA  Take 1 tablet (10 mg total) by mouth daily.     fluticasone 50 MCG/ACT nasal spray  Commonly known as:  FLONASE  Place 1 spray into both nostrils daily as needed for rhinitis or allergies.     gabapentin 400 MG capsule  Commonly known as:  NEURONTIN  Take 1 capsule (400 mg total) by  mouth daily.     glimepiride 4 MG tablet  Commonly known as:  AMARYL  Take 1 tablet (4 mg total) by mouth daily with breakfast.     insulin glargine 100 UNIT/ML injection  Commonly known as:  LANTUS  Inject 0.7 mLs (70 Units total) into the skin at bedtime.     Insulin Syringes (Disposable) U-100 1 ML Misc  For Lantus insulin     isosorbide mononitrate 30 MG 24 hr tablet  Commonly known as:  IMDUR  Take 1 tablet (30 mg total) by mouth daily.     levothyroxine 25 MCG tablet  Commonly known as:  SYNTHROID, LEVOTHROID  Take 1 tablet (25 mcg total) by mouth daily before breakfast.     linagliptin 5 MG Tabs tablet  Commonly known as:  TRADJENTA  Take 1 tablet (5 mg total) by mouth daily.     metoprolol 200 MG 24 hr tablet  Commonly known as:  TOPROL-XL  Take 0.5 tablets (100 mg total) by mouth daily.     nitroGLYCERIN 0.4 MG SL tablet  Commonly known as:  NITROSTAT  Place 1 tablet (0.4 mg total) under the tongue every 5 (five) minutes as needed. For chest     polyethylene glycol powder powder  Commonly known as:  GLYCOLAX/MIRALAX  Take 17 g by mouth 2 (two) times daily as needed.     traMADol 50 MG tablet  Commonly known as:  ULTRAM  Take 1 tablet (50 mg total) by mouth every 6 (six) hours as needed for moderate pain.     traZODone 50 MG tablet  Commonly known as:  DESYREL  Take 0.5-1 tablets (25-50 mg total) by mouth at bedtime as needed for sleep.        Meds ordered this encounter  Medications  . levothyroxine (SYNTHROID, LEVOTHROID) 25 MCG tablet    Sig: Take 1 tablet (25 mcg total) by mouth daily before breakfast.    Dispense:  30 tablet    Refill:  5    Immunization History  Administered Date(s) Administered  . Pneumococcal-Unspecified 12/14/2009    Family History  Problem Relation Age of Onset  . Diabetes Mother   . Hypertension Mother   . Stroke Mother   . Hypertension Father   . Hypertension Brother   . Hypertension Sister   .  Diabetes Sister     . Hyperlipidemia Sister     History  Substance Use Topics  . Smoking status: Former Smoker -- 1.00 packs/day for 25 years    Types: Cigarettes    Quit date: 04/04/2000  . Smokeless tobacco: Never Used  . Alcohol Use: Yes     Comment: 08/04/2013  "have a glass of red wine on my birthday q yr; that's it"    Review of Systems   As noted in HPI  67 Vitals:   09/21/13 1243  BP: 130/70  Pulse:   Temp:   Resp:     Physical Exam  Physical Exam  Constitutional: No distress.  Eyes: EOM are normal. Pupils are equal, round, and reactive to light.  Cardiovascular: Normal rate and regular rhythm.   Pulmonary/Chest: Breath sounds normal. No respiratory distress. She has no wheezes. She has no rales.  Musculoskeletal: She exhibits no edema.  Skin:  Palpable lump on the right side of face in front of the right ear nontender no apparent discharge no erythema.    CBC    Component Value Date/Time   WBC 5.4 08/04/2013 0430   RBC 3.75* 08/04/2013 0430   HGB 12.4 08/04/2013 0430   HCT 34.8* 08/04/2013 0430   PLT 189 08/04/2013 0430   MCV 92.8 08/04/2013 0430   LYMPHSABS 2.4 06/02/2013 1514   MONOABS 0.4 06/02/2013 1514   EOSABS 0.3 06/02/2013 1514   BASOSABS 0.0 06/02/2013 1514    CMP     Component Value Date/Time   NA 139 08/04/2013 0430   K 4.5 08/04/2013 0430   CL 102 08/04/2013 0430   CO2 23 08/04/2013 0430   GLUCOSE 337* 08/04/2013 0430   BUN 33* 08/04/2013 0430   CREATININE 1.73* 08/04/2013 0430   CREATININE 1.67* 06/02/2013 1514   CALCIUM 11.2* 08/04/2013 0430   PROT 8.0 08/03/2013 2231   ALBUMIN 4.1 08/03/2013 2231   AST 23 08/03/2013 2231   ALT 23 08/03/2013 2231   ALKPHOS 117 08/03/2013 2231   BILITOT 0.4 08/03/2013 2231   GFRNONAA 31* 08/04/2013 0430   GFRNONAA 33* 06/02/2013 1514   GFRAA 36* 08/04/2013 0430   GFRAA 39* 06/02/2013 1514    Lab Results  Component Value Date/Time   CHOL 180 08/04/2013  4:30 AM    No components found with this basename: hga1c    Lab Results   Component Value Date/Time   AST 23 08/03/2013 10:31 PM    Assessment and Plan  DM (diabetes mellitus) - Plan:  Results for orders placed in visit on 09/21/13  GLUCOSE, POCT (MANUAL RESULT ENTRY)      Result Value Ref Range   POC Glucose 212 (*) 70 - 99 mg/dl   Diabetes is uncontrolled, have advised patient to increase the dose of Lantus to 75 units each bedtime, continue with NovoLog, and her oral hypoglycemic medications also, Ambulatory referral to Endocrinology  Unspecified hypothyroidism - Plan: Continue with her levothyroxine (SYNTHROID, LEVOTHROID) 25 MCG, will recheck her TSH level on the next visit  Essential hypertension, benign Repeat manual blood pressure is 130/70 continued current medications advise for DASH diet  Epidermoid cyst - Plan: Ambulatory referral to Dermatology  Return for diabetes, hypertension, hypothyroid.  Lorayne Marek, MD

## 2013-09-23 ENCOUNTER — Ambulatory Visit: Payer: Medicare Other | Admitting: Cardiovascular Disease

## 2013-09-24 ENCOUNTER — Telehealth: Payer: Self-pay | Admitting: *Deleted

## 2013-09-24 ENCOUNTER — Ambulatory Visit (INDEPENDENT_AMBULATORY_CARE_PROVIDER_SITE_OTHER): Payer: Medicare Other | Admitting: Cardiology

## 2013-09-24 ENCOUNTER — Encounter: Payer: Self-pay | Admitting: Cardiology

## 2013-09-24 VITALS — BP 148/78 | HR 76 | Ht 70.5 in | Wt 251.8 lb

## 2013-09-24 DIAGNOSIS — R9439 Abnormal result of other cardiovascular function study: Secondary | ICD-10-CM | POA: Diagnosis not present

## 2013-09-24 DIAGNOSIS — I2510 Atherosclerotic heart disease of native coronary artery without angina pectoris: Secondary | ICD-10-CM

## 2013-09-24 MED ORDER — METOPROLOL SUCCINATE ER 100 MG PO TB24: 100.0000 mg | ORAL_TABLET | Freq: Every day | ORAL | Status: DC

## 2013-09-24 NOTE — Telephone Encounter (Signed)
LATE ENTRY  Spoke to [place] ON 09/27/13 FOR CATH ON 09/28/13

## 2013-09-24 NOTE — Patient Instructions (Addendum)
Your physician has requested that you have a cardiac catheterization. Cardiac catheterization is used to diagnose and/or treat various heart conditions. Doctors may recommend this procedure for a number of different reasons. The most common reason is to evaluate chest pain. Chest pain can be a symptom of coronary artery disease (CAD), and cardiac catheterization can show whether plaque is narrowing or blocking your heart's arteries. This procedure is also used to evaluate the valves, as well as measure the blood flow and oxygen levels in different parts of your heart. For further information please visit [facility]iest[facility][email or web address]. Please follow instruction sheet, as given.  You will be admitted June 15,2015 day before cardiac cath for hydration . Schedule cath with DR Martinique  CONTINUE WITH CURRENT MEDICATION

## 2013-09-24 NOTE — Progress Notes (Signed)
Patient ID: Brooke Stafford, female   DOB: 1955-05-08, 58 y.o.   MRN: [id number]    09/24/2013 Brooke Stafford   10-Nov-1955  [id number]  Primary Physicia Angelica Chessman, MD Primary Cardiologist: Dr Sallyanne Kuster  HPI:  The patient is a 58 year old female. Dr. Sallyanne Kuster follows her cardiac issues however Dr. Gwenlyn Found follows her peripheral vascular disease. She has a past medical history significant for severe type 2 diabetes mellitus requiring insulin therapy, systemic hypertension, hyperlipidemia, obesity, PVD S/P LSFA PTA initally in 2012 and again in 2014, coronary artery disease, S/P CABG x 6 in 2002. Her last cardiac catheterization was done in July 2011 when she presented with sepsis and medication non compliance and had a NSTEMI from demand ischemia. Cath then showed a patent LIMA to LAD, patent SVG to diagonal, and patent sequential SVG to branches of the right coronary artery. The plan was for medical Rx. She also has a mild ischemic cardiomyopathy with a left ventricular ejection fraction of 45% to 50% by echo 5/11.   She presented to [facility] on 08/03/13, with a complaint of chest pain that started after an argument at home. The pain felt similar to her past anginal pain. It was substernal and pressure-like and was relieved with sublingual nitroglycerin x1. In the ER her glucose was elevated. Her EKG was unremarkable. She was admitted for diabetes control and cardiac rule out. She ultimately ruled out for acute coronary syndrome with 3 sets of negative cardiac enzymes. She was seen by Dr. Ron Parker and he felt that no further inpatient cardiac workup was indicated. After her diabetes was better controlled, she was discharged home from the hospital was instructed to followup in clinic.   I evaluated her in clinic for post hospital followup on 08/30/2013. At that time she continued to endorse intermittent chest pain with exertional activities, mainly with household chores. She was not given a  prescription for sublingual nitroglycerin before she was discharged from the hospital. She also noted mild dyspnea on exertion and complained of 2-3 pillow orthopnea and PND. At that visit, I ordered for her to undergo nuclear stress testing to assess for ischemia as well as a 2-D echocardiogram to assess both systolic and diastolic function. I also ordered sublingual nitroglycerin to be used as needed for chest pain.  She presents back to clinic today for followup and to review her test results. She states that she has had to use of sublingual nitroglycerin on 2 separate occasions, both times which relieved her chest pain. Review of her stress test reveals that this was abnormal. There was noted to be decreased uptake in the inferior wall on stress images and there was also decreased uptake in the inferior wall with the rest images. These findings were consistent with ischemia. This was also reviewed by Dr. Gwenlyn Found in clinic today and he has recommended that she undergo further evaluation with diagnostic left heart catheterization.  Her 2-D echocardiogram reveals normal systolic function with an ejection fraction of 55-60%. Wall motion was normal and there were no regional wall motion abnormalities. There were no significant valvular abnormalities.    Current Outpatient Prescriptions  Medication Sig Dispense Refill  . albuterol (PROVENTIL HFA;VENTOLIN HFA) 108 (90 BASE) MCG/ACT inhaler Inhale 2 puffs into the lungs every 6 (six) hours as needed. For shortness of breath  1 Inhaler  3  . allopurinol (ZYLOPRIM) 300 MG tablet Take 150 mg by mouth daily.      [name] amLODipine (NORVASC) 10 MG tablet Take  1 tablet (10 mg total) by mouth daily.  30 tablet  2  . atorvastatin (LIPITOR) 40 MG tablet Take 1 tablet (40 mg total) by mouth daily.  90 tablet  3  . cloNIDine (CATAPRES) 0.2 MG tablet Take 1 tablet (0.2 mg total) by mouth 3 (three) times daily.  90 tablet  1  . clopidogrel (PLAVIX) 75 MG tablet Take 1 tablet  (75 mg total) by mouth daily.  30 tablet  6  . ezetimibe (ZETIA) 10 MG tablet Take 1 tablet (10 mg total) by mouth daily.  90 tablet  3  . fluticasone (FLONASE) 50 MCG/ACT nasal spray Place 1 spray into both nostrils daily as needed for rhinitis or allergies.  16 g  2  . gabapentin (NEURONTIN) 400 MG capsule Take 1 capsule (400 mg total) by mouth daily.  90 capsule  3  . glimepiride (AMARYL) 4 MG tablet Take 1 tablet (4 mg total) by mouth daily with breakfast.  30 tablet  4  . insulin glargine (LANTUS) 100 UNIT/ML injection Inject 0.7 mLs (70 Units total) into the skin at bedtime.  10 mL  5  . Insulin Syringes, Disposable, U-100 1 ML MISC For Lantus insulin  100 each  3  . isosorbide mononitrate (IMDUR) 30 MG 24 hr tablet Take 1 tablet (30 mg total) by mouth daily.  90 tablet  3  . levothyroxine (SYNTHROID, LEVOTHROID) 25 MCG tablet Take 1 tablet (25 mcg total) by mouth daily before breakfast.  30 tablet  5  . linagliptin (TRADJENTA) 5 MG TABS tablet Take 1 tablet (5 mg total) by mouth daily.  30 tablet  3  . loratadine (CLARITIN) 10 MG tablet Take 1 tablet (10 mg total) by mouth daily.  30 tablet  11  . nitroGLYCERIN (NITROSTAT) 0.4 MG SL tablet Place 1 tablet (0.4 mg total) under the tongue every 5 (five) minutes as needed. For chest  30 tablet  6  . polyethylene glycol powder (GLYCOLAX/MIRALAX) powder Take 17 g by mouth 2 (two) times daily as needed.  3350 g  3  . traMADol (ULTRAM) 50 MG tablet Take 1 tablet (50 mg total) by mouth every 6 (six) hours as needed for moderate pain.  60 tablet  0  . traZODone (DESYREL) 50 MG tablet Take 0.5-1 tablets (25-50 mg total) by mouth at bedtime as needed for sleep.  60 tablet  3  . metoprolol succinate (TOPROL-XL) 100 MG 24 hr tablet Take 1 tablet (100 mg total) by mouth daily. Take with or immediately following a meal.  90 tablet  3   No current facility-administered medications for this visit.    Allergies  Allergen Reactions  . Penicillins Cross  Reactors Hives    And high fever  . Adhesive [Tape] Rash    bruising    History   Social History  . Marital Status: Divorced    Spouse Name: N/A    Number of Children: N/A  . Years of Education: N/A   Occupational History  . Not on file.   Social History Main Topics  . Smoking status: Former Smoker -- 1.00 packs/day for 25 years    Types: Cigarettes    Quit date: 04/04/2000  . Smokeless tobacco: Never Used  . Alcohol Use: Yes     Comment: 08/04/2013  "have a glass of red wine on my birthday q yr; that's it"  . Drug Use: No  . Sexual Activity: No   Other Topics Concern  . Not on  file   Social History Narrative  . No narrative on file     Review of Systems: General: negative for chills, fever, night sweats or weight changes.  Cardiovascular: positive for chest pain anddyspnea on exertion, negative for edema, orthopnea, palpitations, paroxysmal nocturnal dyspnea or shortness of breath Dermatological: negative for rash Respiratory: negative for cough or wheezing Urologic: negative for hematuria Abdominal: negative for nausea, vomiting, diarrhea, bright red blood per rectum, melena, or hematemesis Neurologic: negative for visual changes, syncope, or dizziness All other systems reviewed and are otherwise negative except as noted above.    Blood pressure 148/78, pulse 76, height 5' 10.5" (1.791 m), weight 251 lb 12.8 oz (114.216 kg).  General appearance: alert, cooperative and no distress Neck: no carotid bruit and no JVD Lungs: clear to auscultation bilaterally Heart: regular rate and rhythm, S1, S2 normal, no murmur, click, rub or gallop Extremities: no [name] Pulses: 2+ and symmetric Skin: warm and dry Neurologic: Grossly normal  EKG not peformed  ASSESSMENT AND PLAN:   1. Angina/abnormal nuclear stress test: The patient has a known history of CAD and is status post CABG x6 in 2002. She also has multiple cardiac risk factors including obesity, poorly controlled  type 2 diabetes, hypertension and hyperlipidemia. In light of her recent nitrate responsive chest pain and abnormal nuclear stress test, she has been referred for diagnostic left heart catheterization. This has been arranged to be completed at [facility] by Dr. Martinique on 09/27/13. Due to renal insufficiency with a serum creatinine of 1.73, at time of last BMP one month ago, she will be admitted to the hospital the day prior to the planned procedure for precath hydration with IV fluids. Her recent 2-D echo revealed normal systolic function, thus she can be hydrated aggressively. For now continue current medications for CAD. She has sublingual nitroglycerin at home. She has been instructed to seek urgent medical evaluation if she develops severe anginal pain, not responsive to nitroglycerin. She verbalized understanding.   PLAN  Plan for diagnostic left heart catheterization with Dr. Martinique on 09/27/2013. She will be admitted the night prior for precath hydration, in the setting of renal insufficiency.  Arvilla Market 09/24/2013 5:46 PM

## 2013-09-27 ENCOUNTER — Encounter (HOSPITAL_COMMUNITY): Payer: Self-pay | Admitting: General Practice

## 2013-09-27 ENCOUNTER — Inpatient Hospital Stay: Admit: 2013-09-27 | Payer: Self-pay | Admitting: Cardiovascular Disease

## 2013-09-27 ENCOUNTER — Inpatient Hospital Stay (HOSPITAL_COMMUNITY)
Admission: AD | Admit: 2013-09-27 | Discharge: 2013-09-29 | DRG: 247 | Disposition: A | Discharge status: Home / Self Care | Payer: Medicare Other | Source: Ambulatory Visit | Attending: Cardiology | Admitting: Cardiology

## 2013-09-27 DIAGNOSIS — I252 Old myocardial infarction: Secondary | ICD-10-CM

## 2013-09-27 DIAGNOSIS — I200 Unstable angina: Secondary | ICD-10-CM | POA: Diagnosis present

## 2013-09-27 DIAGNOSIS — E1129 Type 2 diabetes mellitus with other diabetic kidney complication: Secondary | ICD-10-CM | POA: Diagnosis present

## 2013-09-27 DIAGNOSIS — N183 Chronic kidney disease, stage 3 unspecified: Secondary | ICD-10-CM | POA: Diagnosis not present

## 2013-09-27 DIAGNOSIS — I129 Hypertensive chronic kidney disease with stage 1 through stage 4 chronic kidney disease, or unspecified chronic kidney disease: Secondary | ICD-10-CM | POA: Diagnosis present

## 2013-09-27 DIAGNOSIS — T82897A Other specified complication of cardiac prosthetic devices, implants and grafts, initial encounter: Secondary | ICD-10-CM | POA: Diagnosis not present

## 2013-09-27 DIAGNOSIS — Z6835 Body mass index (BMI) 35.0-35.9, adult: Secondary | ICD-10-CM

## 2013-09-27 DIAGNOSIS — I739 Peripheral vascular disease, unspecified: Secondary | ICD-10-CM | POA: Diagnosis present

## 2013-09-27 DIAGNOSIS — N184 Chronic kidney disease, stage 4 (severe): Secondary | ICD-10-CM | POA: Diagnosis not present

## 2013-09-27 DIAGNOSIS — N058 Unspecified nephritic syndrome with other morphologic changes: Secondary | ICD-10-CM | POA: Diagnosis present

## 2013-09-27 DIAGNOSIS — N1832 Chronic kidney disease, stage 3b: Secondary | ICD-10-CM

## 2013-09-27 DIAGNOSIS — I2510 Atherosclerotic heart disease of native coronary artery without angina pectoris: Secondary | ICD-10-CM | POA: Diagnosis present

## 2013-09-27 DIAGNOSIS — E669 Obesity, unspecified: Secondary | ICD-10-CM | POA: Diagnosis present

## 2013-09-27 DIAGNOSIS — I10 Essential (primary) hypertension: Secondary | ICD-10-CM

## 2013-09-27 DIAGNOSIS — Z794 Long term (current) use of insulin: Secondary | ICD-10-CM

## 2013-09-27 DIAGNOSIS — I25810 Atherosclerosis of coronary artery bypass graft(s) without angina pectoris: Secondary | ICD-10-CM | POA: Diagnosis not present

## 2013-09-27 DIAGNOSIS — R9439 Abnormal result of other cardiovascular function study: Secondary | ICD-10-CM | POA: Diagnosis not present

## 2013-09-27 DIAGNOSIS — Y832 Surgical operation with anastomosis, bypass or graft as the cause of abnormal reaction of the patient, or of later complication, without mention of misadventure at the time of the procedure: Secondary | ICD-10-CM | POA: Diagnosis present

## 2013-09-27 DIAGNOSIS — E785 Hyperlipidemia, unspecified: Secondary | ICD-10-CM | POA: Diagnosis present

## 2013-09-27 DIAGNOSIS — E039 Hypothyroidism, unspecified: Secondary | ICD-10-CM | POA: Diagnosis present

## 2013-09-27 DIAGNOSIS — Z955 Presence of coronary angioplasty implant and graft: Secondary | ICD-10-CM

## 2013-09-27 DIAGNOSIS — E1122 Type 2 diabetes mellitus with diabetic chronic kidney disease: Secondary | ICD-10-CM

## 2013-09-27 DIAGNOSIS — I209 Angina pectoris, unspecified: Secondary | ICD-10-CM | POA: Diagnosis not present

## 2013-09-27 DIAGNOSIS — Z7902 Long term (current) use of antithrombotics/antiplatelets: Secondary | ICD-10-CM

## 2013-09-27 DIAGNOSIS — I2582 Chronic total occlusion of coronary artery: Secondary | ICD-10-CM | POA: Diagnosis present

## 2013-09-27 HISTORY — DX: Angina pectoris, unspecified: I20.9

## 2013-09-27 LAB — CBC WITH DIFFERENTIAL/PLATELET
BASOS ABS: 0 10*3/uL (ref 0.0–0.1)
BASOS PCT: 0 % (ref 0–1)
Eosinophils Absolute: 0.2 10*3/uL (ref 0.0–0.7)
Eosinophils Relative: 3 % (ref 0–5)
HEMATOCRIT: 34.8 % — AB (ref 36.0–46.0)
Hemoglobin: 12 g/dL (ref 12.0–15.0)
Lymphocytes Relative: 25 % (ref 12–46)
Lymphs Abs: 1.6 10*3/uL (ref 0.7–4.0)
MCH: 30.9 pg (ref 26.0–34.0)
MCHC: 34.5 g/dL (ref 30.0–36.0)
MCV: 89.7 fL (ref 78.0–100.0)
Monocytes Absolute: 0.3 10*3/uL (ref 0.1–1.0)
Monocytes Relative: 5 % (ref 3–12)
NEUTROS ABS: 4.4 10*3/uL (ref 1.7–7.7)
Neutrophils Relative %: 67 % (ref 43–77)
PLATELETS: 254 10*3/uL (ref 150–400)
RBC: 3.88 MIL/uL (ref 3.87–5.11)
RDW: 13.2 % (ref 11.5–15.5)
WBC: 6.4 10*3/uL (ref 4.0–10.5)

## 2013-09-27 LAB — COMPREHENSIVE METABOLIC PANEL
ALT: 15 U/L (ref 0–35)
AST: 12 U/L (ref 0–37)
Albumin: 3.8 g/dL (ref 3.5–5.2)
Alkaline Phosphatase: 111 U/L (ref 39–117)
BUN: 24 mg/dL — AB (ref 6–23)
CO2: 27 meq/L (ref 19–32)
CREATININE: 1.32 mg/dL — AB (ref 0.50–1.10)
Calcium: 10.7 mg/dL — ABNORMAL HIGH (ref 8.4–10.5)
Chloride: 101 mEq/L (ref 96–112)
GFR calc Af Amer: 50 mL/min — ABNORMAL LOW (ref >90)
GFR, EST NON AFRICAN AMERICAN: 43 mL/min — AB (ref >90)
Glucose, Bld: 241 mg/dL — ABNORMAL HIGH (ref 70–99)
Potassium: 3.8 mEq/L (ref 3.7–5.3)
Sodium: 138 mEq/L (ref 137–147)
Total Bilirubin: 0.3 mg/dL (ref 0.3–1.2)
Total Protein: 7.7 g/dL (ref 6.0–8.3)

## 2013-09-27 LAB — GLUCOSE, CAPILLARY
GLUCOSE-CAPILLARY: 271 mg/dL — AB (ref 70–99)
Glucose-Capillary: 260 mg/dL — ABNORMAL HIGH (ref 70–99)
Glucose-Capillary: 274 mg/dL — ABNORMAL HIGH (ref 70–99)

## 2013-09-27 LAB — HEMOGLOBIN A1C
Hgb A1c MFr Bld: 10.5 % — ABNORMAL HIGH (ref <5.7)
Mean Plasma Glucose: 255 mg/dL — ABNORMAL HIGH (ref <117)

## 2013-09-27 LAB — PROTIME-INR
INR: 0.98 (ref 0.00–1.49)
PROTHROMBIN TIME: 12.8 s (ref 11.6–15.2)

## 2013-09-27 MED ORDER — NITROGLYCERIN 0.4 MG SL SUBL: 0.4000 mg | SUBLINGUAL_TABLET | SUBLINGUAL | Status: DC | PRN

## 2013-09-27 MED ORDER — SODIUM CHLORIDE 0.9 % IV SOLN
INTRAVENOUS | Status: DC
  Administered 2013-09-27 – 2013-09-28 (×2): via INTRAVENOUS

## 2013-09-27 MED ORDER — LINAGLIPTIN 5 MG PO TABS
5.0000 mg | ORAL_TABLET | Freq: Every day | ORAL | Status: DC
  Administered 2013-09-28 – 2013-09-29 (×2): 5 mg via ORAL
  Filled 2013-09-27 (×3): qty 1

## 2013-09-27 MED ORDER — INSULIN ASPART 100 UNIT/ML ~~LOC~~ SOLN
0.0000 [IU] | Freq: Three times a day (TID) | SUBCUTANEOUS | Status: DC
  Administered 2013-09-27: 8 [IU] via SUBCUTANEOUS
  Administered 2013-09-28: 19:00:00 5 [IU] via SUBCUTANEOUS
  Administered 2013-09-29: 09:00:00 8 [IU] via SUBCUTANEOUS

## 2013-09-27 MED ORDER — LORATADINE 10 MG PO TABS
10.0000 mg | ORAL_TABLET | Freq: Every day | ORAL | Status: DC
  Administered 2013-09-27 – 2013-09-29 (×3): 10 mg via ORAL
  Filled 2013-09-27 (×3): qty 1

## 2013-09-27 MED ORDER — ONDANSETRON HCL 4 MG/2ML IJ SOLN: 4.0000 mg | Freq: Four times a day (QID) | INTRAMUSCULAR | Status: DC | PRN

## 2013-09-27 MED ORDER — TRAMADOL HCL 50 MG PO TABS
50.0000 mg | ORAL_TABLET | Freq: Four times a day (QID) | ORAL | Status: DC | PRN
  Administered 2013-09-27: 50 mg via ORAL
  Filled 2013-09-27: qty 1

## 2013-09-27 MED ORDER — POLYETHYLENE GLYCOL 3350 17 GM/SCOOP PO POWD
17.0000 g | Freq: Two times a day (BID) | ORAL | Status: DC | PRN
  Filled 2013-09-27: qty 255

## 2013-09-27 MED ORDER — SODIUM CHLORIDE 0.9 % IJ SOLN
3.0000 mL | Freq: Two times a day (BID) | INTRAMUSCULAR | Status: DC
  Administered 2013-09-27 (×2): 3 mL via INTRAVENOUS

## 2013-09-27 MED ORDER — FLUTICASONE PROPIONATE 50 MCG/ACT NA SUSP
1.0000 | Freq: Every day | NASAL | Status: DC | PRN
  Filled 2013-09-27: qty 16

## 2013-09-27 MED ORDER — SODIUM CHLORIDE 0.9 % IV SOLN: 250.0000 mL | INTRAVENOUS | Status: DC | PRN

## 2013-09-27 MED ORDER — GABAPENTIN 400 MG PO CAPS
400.0000 mg | ORAL_CAPSULE | Freq: Every day | ORAL | Status: DC
  Administered 2013-09-27 – 2013-09-29 (×3): 400 mg via ORAL
  Filled 2013-09-27 (×3): qty 1

## 2013-09-27 MED ORDER — AMLODIPINE BESYLATE 10 MG PO TABS
10.0000 mg | ORAL_TABLET | Freq: Every day | ORAL | Status: DC
  Administered 2013-09-27 – 2013-09-28 (×2): 10 mg via ORAL
  Filled 2013-09-27 (×3): qty 1

## 2013-09-27 MED ORDER — ATORVASTATIN CALCIUM 40 MG PO TABS
40.0000 mg | ORAL_TABLET | Freq: Every day | ORAL | Status: DC
  Administered 2013-09-28 – 2013-09-29 (×2): 40 mg via ORAL
  Filled 2013-09-27 (×3): qty 1

## 2013-09-27 MED ORDER — ENOXAPARIN SODIUM 40 MG/0.4ML ~~LOC~~ SOLN
40.0000 mg | SUBCUTANEOUS | Status: DC
  Administered 2013-09-27 – 2013-09-28 (×2): 40 mg via SUBCUTANEOUS
  Filled 2013-09-27 (×2): qty 0.4

## 2013-09-27 MED ORDER — CLOPIDOGREL BISULFATE 75 MG PO TABS
75.0000 mg | ORAL_TABLET | Freq: Every day | ORAL | Status: DC
  Administered 2013-09-27: 75 mg via ORAL
  Filled 2013-09-27 (×2): qty 1

## 2013-09-27 MED ORDER — TRAZODONE 25 MG HALF TABLET
25.0000 mg | ORAL_TABLET | Freq: Every evening | ORAL | Status: DC | PRN
  Filled 2013-09-27: qty 2

## 2013-09-27 MED ORDER — ALLOPURINOL 150 MG HALF TABLET
150.0000 mg | ORAL_TABLET | Freq: Every day | ORAL | Status: DC
  Administered 2013-09-27 – 2013-09-29 (×3): 150 mg via ORAL
  Filled 2013-09-27 (×3): qty 1

## 2013-09-27 MED ORDER — ALPRAZOLAM 0.25 MG PO TABS
0.2500 mg | ORAL_TABLET | Freq: Two times a day (BID) | ORAL | Status: DC | PRN
  Administered 2013-09-28: 23:00:00 0.25 mg via ORAL
  Filled 2013-09-27: qty 1

## 2013-09-27 MED ORDER — SODIUM CHLORIDE 0.9 % IJ SOLN: 3.0000 mL | INTRAMUSCULAR | Status: DC | PRN

## 2013-09-27 MED ORDER — ALBUTEROL SULFATE (2.5 MG/3ML) 0.083% IN NEBU: 3.0000 mL | INHALATION_SOLUTION | Freq: Four times a day (QID) | RESPIRATORY_TRACT | Status: DC | PRN

## 2013-09-27 MED ORDER — INSULIN GLARGINE 100 UNIT/ML ~~LOC~~ SOLN
70.0000 [IU] | Freq: Every day | SUBCUTANEOUS | Status: DC
  Administered 2013-09-27 – 2013-09-28 (×2): 70 [IU] via SUBCUTANEOUS
  Filled 2013-09-27 (×3): qty 0.7

## 2013-09-27 MED ORDER — ZOLPIDEM TARTRATE 5 MG PO TABS: 5.0000 mg | ORAL_TABLET | Freq: Every evening | ORAL | Status: DC | PRN

## 2013-09-27 MED ORDER — GLIMEPIRIDE 4 MG PO TABS
4.0000 mg | ORAL_TABLET | Freq: Every day | ORAL | Status: DC
  Administered 2013-09-28: 15:00:00 4 mg via ORAL
  Filled 2013-09-27 (×3): qty 1

## 2013-09-27 MED ORDER — POLYETHYLENE GLYCOL 3350 17 G PO PACK
17.0000 g | PACK | Freq: Two times a day (BID) | ORAL | Status: DC | PRN
  Filled 2013-09-27: qty 1

## 2013-09-27 MED ORDER — ISOSORBIDE MONONITRATE ER 30 MG PO TB24
30.0000 mg | ORAL_TABLET | Freq: Every day | ORAL | Status: DC
  Administered 2013-09-28 – 2013-09-29 (×2): 30 mg via ORAL
  Filled 2013-09-27 (×3): qty 1

## 2013-09-27 MED ORDER — ACETAMINOPHEN 325 MG PO TABS: 650.0000 mg | ORAL_TABLET | ORAL | Status: DC | PRN

## 2013-09-27 MED ORDER — TRAMADOL HCL 50 MG PO TABS
50.0000 mg | ORAL_TABLET | Freq: Four times a day (QID) | ORAL | Status: DC | PRN
  Administered 2013-09-27: 100 mg via ORAL
  Administered 2013-09-28: 23:00:00 50 mg via ORAL
  Filled 2013-09-27: qty 1
  Filled 2013-09-27: qty 2

## 2013-09-27 MED ORDER — ASPIRIN 81 MG PO CHEW
324.0000 mg | CHEWABLE_TABLET | ORAL | Status: AC
  Administered 2013-09-28: 324 mg via ORAL
  Filled 2013-09-27: qty 4

## 2013-09-27 MED ORDER — METOPROLOL SUCCINATE ER 100 MG PO TB24
100.0000 mg | ORAL_TABLET | Freq: Every day | ORAL | Status: DC
  Administered 2013-09-27 – 2013-09-28 (×2): 100 mg via ORAL
  Filled 2013-09-27 (×3): qty 1

## 2013-09-27 MED ORDER — EZETIMIBE 10 MG PO TABS
10.0000 mg | ORAL_TABLET | Freq: Every day | ORAL | Status: DC
  Administered 2013-09-28 – 2013-09-29 (×2): 10 mg via ORAL
  Filled 2013-09-27 (×3): qty 1

## 2013-09-27 MED ORDER — LEVOTHYROXINE SODIUM 25 MCG PO TABS
25.0000 ug | ORAL_TABLET | Freq: Every day | ORAL | Status: DC
  Administered 2013-09-28 – 2013-09-29 (×2): 25 ug via ORAL
  Filled 2013-09-27 (×3): qty 1

## 2013-09-27 MED ORDER — CLONIDINE HCL 0.2 MG PO TABS
0.2000 mg | ORAL_TABLET | Freq: Three times a day (TID) | ORAL | Status: DC
Start: 2013-09-27 — End: 2013-09-29
  Administered 2013-09-27 – 2013-09-29 (×5): 0.2 mg via ORAL
  Filled 2013-09-27 (×8): qty 1

## 2013-09-27 NOTE — Progress Notes (Signed)
Patient Name: Brooke Stafford Date of Encounter: 09/27/2013  Principal Problem:   Angina pectoris Active Problems:   Diabetes mellitus with chronic kidney disease   Dyslipidemia   Essential hypertension, benign   Unspecified hypothyroidism    Patient Profile: 58 yo female w/ hx CABG 2002, PVD, DM, HTN, HLD who was seen in the office after an an abnormal stress test.  SUBJECTIVE: No chest pain, no SOB, lots of right leg pain. Ongoing problem, burning, aching. Takes Tramadol at home.  OBJECTIVE Filed Vitals:   09/27/13 1153 09/27/13 1226 09/27/13 1312  BP: 160/71  162/78  Pulse: 67  65  Temp: 98.6 F (37 C)  98.8 F (37.1 C)  TempSrc: Oral  Oral  Resp: 16  18  Height:  5' 10.5" (1.791 m)   Weight:  248 lb 4.8 oz (112.628 kg)   SpO2: 97%  94%   No intake or output data in the 24 hours ending 09/27/13 1627 Filed Weights   09/27/13 1226  Weight: 248 lb 4.8 oz (112.628 kg)    PHYSICAL EXAM General: Well developed, well nourished, female in no acute distress. Head: Normocephalic, atraumatic.  Neck: Supple without bruits, JVD not elevated. Lungs:  Resp regular and unlabored, CTA. Heart: RRR, S1, S2, no S3, S4, or murmur; no rub. Abdomen: Soft, non-tender, non-distended, BS + x 4.  Extremities: No clubbing, cyanosis, no edema.  Neuro: Alert and oriented X 3. Moves all extremities spontaneously. Psych: Normal affect.  LABS: CBC:  Recent Labs  09/27/13 1345  WBC 6.4  NEUTROABS 4.4  HGB 12.0  HCT 34.8*  MCV 89.7  PLT 254   INR:  Recent Labs  09/27/13 1345  INR [id number]   Basic Metabolic Panel:  Recent Labs  09/27/13 1345  NA 138  K 3.8  CL 101  CO2 27  GLUCOSE 241*  BUN 24*  CREATININE 1.32*  CALCIUM 10.7*   Liver Function Tests:  Recent Labs  09/27/13 1345  AST 12  ALT 15  ALKPHOS 111  BILITOT 0.3  PROT 7.7  ALBUMIN 3.8    Radiology/Studies: Lexiscan MV Overall Impression: Low risk stress nuclear study Mild inferior  ischemia  VS diaphragmatic attenuation. ROV with Dr. Gwenlyn Found. LV Wall Motion: NL LV Function; NL Wall Motion   Current Medications:  . allopurinol  150 mg Oral Daily  . amLODipine  10 mg Oral Daily  . [START ON 09/28/2013] aspirin  324 mg Oral Pre-Cath  . atorvastatin  40 mg Oral Daily  . cloNIDine  0.2 mg Oral TID  . clopidogrel  75 mg Oral Daily  . enoxaparin (LOVENOX) injection  40 mg Subcutaneous Q24H  . ezetimibe  10 mg Oral Daily  . gabapentin  400 mg Oral Daily  . [START ON 09/28/2013] glimepiride  4 mg Oral Q breakfast  . insulin aspart  0-15 Units Subcutaneous TID WC  . insulin glargine  70 Units Subcutaneous QHS  . isosorbide mononitrate  30 mg Oral Daily  . [START ON 09/28/2013] levothyroxine  25 mcg Oral QAC breakfast  . linagliptin  5 mg Oral Daily  . loratadine  10 mg Oral Daily  . metoprolol succinate  100 mg Oral Daily  . sodium chloride  3 mL Intravenous Q12H   . sodium chloride 75 mL/hr at 09/27/13 1359    ASSESSMENT AND PLAN: Active Problems:   Angina pectoris - MV was abnormal, seen in office and scheduled for admission w/ hydration overnight and cath in am.  Recheck labs in am. Orders written, will ck P2Y12 since on Plavix. Continue current BP meds.   Signed, Rosaria Ferries , PA-C 4:27 PM 09/27/2013'  Patient examined chart reviewed.  Getting hydrated for cath No chest pain.  Previous CABG Cr ok 1.3  On for Dr Martinique at 7:30 am  Jenkins Rouge

## 2013-09-28 ENCOUNTER — Encounter (HOSPITAL_COMMUNITY)
Admission: AD | Disposition: A | Discharge status: Home / Self Care | Payer: Medicare Other | Source: Ambulatory Visit | Attending: Cardiology

## 2013-09-28 DIAGNOSIS — T82897A Other specified complication of cardiac prosthetic devices, implants and grafts, initial encounter: Secondary | ICD-10-CM | POA: Diagnosis not present

## 2013-09-28 DIAGNOSIS — I200 Unstable angina: Secondary | ICD-10-CM | POA: Diagnosis present

## 2013-09-28 DIAGNOSIS — I25810 Atherosclerosis of coronary artery bypass graft(s) without angina pectoris: Secondary | ICD-10-CM

## 2013-09-28 DIAGNOSIS — N184 Chronic kidney disease, stage 4 (severe): Secondary | ICD-10-CM | POA: Diagnosis not present

## 2013-09-28 DIAGNOSIS — E1129 Type 2 diabetes mellitus with other diabetic kidney complication: Secondary | ICD-10-CM | POA: Diagnosis not present

## 2013-09-28 HISTORY — PX: CORONARY ANGIOPLASTY WITH STENT PLACEMENT: SHX49

## 2013-09-28 HISTORY — PX: LEFT HEART CATHETERIZATION WITH CORONARY/GRAFT ANGIOGRAM: SHX5450

## 2013-09-28 LAB — POCT ACTIVATED CLOTTING TIME
Activated Clotting Time: 370 seconds
Activated Clotting Time: 586 seconds

## 2013-09-28 LAB — BASIC METABOLIC PANEL
BUN: 21 mg/dL (ref 6–23)
CO2: 25 mEq/L (ref 19–32)
CREATININE: 1.33 mg/dL — AB (ref 0.50–1.10)
Calcium: 10.4 mg/dL (ref 8.4–10.5)
Chloride: 103 mEq/L (ref 96–112)
GFR calc non Af Amer: 43 mL/min — ABNORMAL LOW (ref >90)
GFR, EST AFRICAN AMERICAN: 50 mL/min — AB (ref >90)
Glucose, Bld: 242 mg/dL — ABNORMAL HIGH (ref 70–99)
Potassium: 3.9 mEq/L (ref 3.7–5.3)
Sodium: 141 mEq/L (ref 137–147)

## 2013-09-28 LAB — GLUCOSE, CAPILLARY
Glucose-Capillary: 234 mg/dL — ABNORMAL HIGH (ref 70–99)
Glucose-Capillary: 194 mg/dL — ABNORMAL HIGH (ref 70–99)
Glucose-Capillary: 250 mg/dL — ABNORMAL HIGH (ref 70–99)
Glucose-Capillary: 246 mg/dL — ABNORMAL HIGH (ref 70–99)
Glucose-Capillary: 315 mg/dL — ABNORMAL HIGH (ref 70–99)

## 2013-09-28 LAB — PLATELET INHIBITION P2Y12: Platelet Function  P2Y12: 174 [PRU] — ABNORMAL LOW (ref 194–418)

## 2013-09-28 LAB — TSH: TSH: 5.41 u[IU]/mL — AB (ref 0.350–4.500)

## 2013-09-28 SURGERY — LEFT HEART CATHETERIZATION WITH CORONARY/GRAFT ANGIOGRAM
Anesthesia: LOCAL

## 2013-09-28 MED ORDER — HEPARIN (PORCINE) IN NACL 2-0.9 UNIT/ML-% IJ SOLN
INTRAMUSCULAR | Status: AC
  Filled 2013-09-28: qty 1000

## 2013-09-28 MED ORDER — SODIUM CHLORIDE 0.9 % IV SOLN
1.0000 mL/kg/h | INTRAVENOUS | Status: AC
  Administered 2013-09-28 (×2): 1 mL/kg/h via INTRAVENOUS

## 2013-09-28 MED ORDER — CLOPIDOGREL BISULFATE 75 MG PO TABS
ORAL_TABLET | ORAL | Status: AC
Start: 2013-09-28 — End: 2013-09-28
  Filled 2013-09-28: qty 1

## 2013-09-28 MED ORDER — ASPIRIN 81 MG PO CHEW
81.0000 mg | CHEWABLE_TABLET | Freq: Every day | ORAL | Status: DC
  Filled 2013-09-28: qty 1

## 2013-09-28 MED ORDER — BIVALIRUDIN 250 MG IV SOLR
INTRAVENOUS | Status: AC
  Filled 2013-09-28: qty 250

## 2013-09-28 MED ORDER — NITROGLYCERIN 0.2 MG/ML ON CALL CATH LAB
INTRAVENOUS | Status: AC
  Filled 2013-09-28: qty 1

## 2013-09-28 MED ORDER — FENTANYL CITRATE 0.05 MG/ML IJ SOLN
INTRAMUSCULAR | Status: AC
  Filled 2013-09-28: qty 2

## 2013-09-28 MED ORDER — CLOPIDOGREL BISULFATE 75 MG PO TABS
ORAL_TABLET | ORAL | Status: AC
  Filled 2013-09-28: qty 1

## 2013-09-28 MED ORDER — MIDAZOLAM HCL 2 MG/2ML IJ SOLN
INTRAMUSCULAR | Status: AC
  Filled 2013-09-28: qty 2

## 2013-09-28 MED ORDER — VERAPAMIL HCL 2.5 MG/ML IV SOLN
INTRAVENOUS | Status: AC
  Filled 2013-09-28: qty 2

## 2013-09-28 MED ORDER — HEPARIN SODIUM (PORCINE) 1000 UNIT/ML IJ SOLN
INTRAMUSCULAR | Status: AC
  Filled 2013-09-28: qty 1

## 2013-09-28 MED ORDER — LIDOCAINE HCL (PF) 1 % IJ SOLN
INTRAMUSCULAR | Status: AC
  Filled 2013-09-28: qty 30

## 2013-09-28 MED ORDER — HYDRALAZINE HCL 20 MG/ML IJ SOLN
10.0000 mg | Freq: Four times a day (QID) | INTRAMUSCULAR | Status: DC | PRN
  Filled 2013-09-28: qty 1

## 2013-09-28 NOTE — H&P (View-Only) (Signed)
Patient Name: Brooke Stafford Date of Encounter: 09/27/2013  Principal Problem:   Angina pectoris Active Problems:   Diabetes mellitus with chronic kidney disease   Dyslipidemia   Essential hypertension, benign   Unspecified hypothyroidism    Patient Profile: 58 yo female w/ hx CABG 2002, PVD, DM, HTN, HLD who was seen in the office after an an abnormal stress test.  SUBJECTIVE: No chest pain, no SOB, lots of right leg pain. Ongoing problem, burning, aching. Takes Tramadol at home.  OBJECTIVE Filed Vitals:   09/27/13 1153 09/27/13 1226 09/27/13 1312  BP: 160/71  162/78  Pulse: 67  65  Temp: 98.6 F (37 C)  98.8 F (37.1 C)  TempSrc: Oral  Oral  Resp: 16  18  Height:  5' 10.5" (1.791 m)   Weight:  248 lb 4.8 oz (112.628 kg)   SpO2: 97%  94%   No intake or output data in the 24 hours ending 09/27/13 1627 Filed Weights   09/27/13 1226  Weight: 248 lb 4.8 oz (112.628 kg)    PHYSICAL EXAM General: Well developed, well nourished, female in no acute distress. Head: Normocephalic, atraumatic.  Neck: Supple without bruits, JVD not elevated. Lungs:  Resp regular and unlabored, CTA. Heart: RRR, S1, S2, no S3, S4, or murmur; no rub. Abdomen: Soft, non-tender, non-distended, BS + x 4.  Extremities: No clubbing, cyanosis, no edema.  Neuro: Alert and oriented X 3. Moves all extremities spontaneously. Psych: Normal affect.  LABS: CBC:  Recent Labs  09/27/13 1345  WBC 6.4  NEUTROABS 4.4  HGB 12.0  HCT 34.8*  MCV 89.7  PLT 254   INR:  Recent Labs  09/27/13 1345  INR [id number]   Basic Metabolic Panel:  Recent Labs  09/27/13 1345  NA 138  K 3.8  CL 101  CO2 27  GLUCOSE 241*  BUN 24*  CREATININE 1.32*  CALCIUM 10.7*   Liver Function Tests:  Recent Labs  09/27/13 1345  AST 12  ALT 15  ALKPHOS 111  BILITOT 0.3  PROT 7.7  ALBUMIN 3.8    Radiology/Studies: Lexiscan MV Overall Impression: Low risk stress nuclear study Mild inferior  ischemia  VS diaphragmatic attenuation. ROV with Dr. Gwenlyn Found. LV Wall Motion: NL LV Function; NL Wall Motion   Current Medications:  . allopurinol  150 mg Oral Daily  . amLODipine  10 mg Oral Daily  . [START ON 09/28/2013] aspirin  324 mg Oral Pre-Cath  . atorvastatin  40 mg Oral Daily  . cloNIDine  0.2 mg Oral TID  . clopidogrel  75 mg Oral Daily  . enoxaparin (LOVENOX) injection  40 mg Subcutaneous Q24H  . ezetimibe  10 mg Oral Daily  . gabapentin  400 mg Oral Daily  . [START ON 09/28/2013] glimepiride  4 mg Oral Q breakfast  . insulin aspart  0-15 Units Subcutaneous TID WC  . insulin glargine  70 Units Subcutaneous QHS  . isosorbide mononitrate  30 mg Oral Daily  . [START ON 09/28/2013] levothyroxine  25 mcg Oral QAC breakfast  . linagliptin  5 mg Oral Daily  . loratadine  10 mg Oral Daily  . metoprolol succinate  100 mg Oral Daily  . sodium chloride  3 mL Intravenous Q12H   . sodium chloride 75 mL/hr at 09/27/13 1359    ASSESSMENT AND PLAN: Active Problems:   Angina pectoris - MV was abnormal, seen in office and scheduled for admission w/ hydration overnight and cath in am.  Recheck labs in am. Orders written, will ck P2Y12 since on Plavix. Continue current BP meds.   Signed, Rosaria Ferries , PA-C 4:27 PM 09/27/2013'  Patient examined chart reviewed.  Getting hydrated for cath No chest pain.  Previous CABG Cr ok 1.3  On for Dr Martinique at 7:30 am  Jenkins Rouge

## 2013-09-28 NOTE — Progress Notes (Signed)
Inpatient Diabetes Program Recommendations  AACE/ADA: New Consensus Statement on Inpatient Glycemic Control (2013)  Target Ranges:  Prepandial:   less than 140 mg/dL      Peak postprandial:   less than 180 mg/dL (1-2 hours)      Critically ill patients:  140 - 180 mg/dL     Results for Brooke Stafford, Brooke Stafford (MRN [id number]) as of 09/28/2013 07:20  Ref. Range 09/27/2013 11:40 09/27/2013 16:39 09/27/2013 21:11  Glucose-Capillary Latest Range: 70-99 mg/dL 271 (H) 260 (H) 274 (H)    Results for Brooke Stafford, Brooke Stafford (MRN [id number]) as of 09/28/2013 07:20  Ref. Range 06/02/2013 15:40 08/04/2013 04:30 09/27/2013 13:45  Hemoglobin A1C Latest Range: <5.7 % 10.2 10.7 (H) 10.5 (H)     Admitted for cardiac cath.  History of DM2, CKD, HTN, CABG, PVD.  Home DM Meds:  Tradjenta 5 mg daily Amaryl 4 mg daily Lantus 70 units QHS   **Per Chart Review, patient was seen by her PCP at the [facility] on 09/21/13.  Per the MD notes, patient was supposed to increase her Lantus dose to 75 units QHS.  Patient has been referred to [facility] and has an appointment with the Endo team on 10/07/13  **Note Home dose of Lantus started last PM.  Patient also started on her Home doses of oral medications + Novolog Moderate SSI.  Patient NPO for cardiac cath today.    Will follow while inpatient Wyn Quaker RN, MSN, CDE Diabetes Coordinator Inpatient Diabetes Program Team Pager: [phone] (8a-10p)

## 2013-09-28 NOTE — Progress Notes (Signed)
UR completed Crystal K. Hutchinson, RN, BSN, [place], CCM  09/28/2013 3:49 PM

## 2013-09-28 NOTE — CV Procedure (Signed)
Cardiac Catheterization Procedure Note  Name: NIANNA IGO MRN: [id number] DOB: 1955/05/06  Procedure: Left Heart Cath, Selective Coronary Angiography, SVG and LIMA angiography, PTCA and stenting of the SVG to the diagonal.   Indication: 58 yo BF with history of IDDM, PAD, HTN, HL presents with symptoms of chest pain class 3. myoview demonstrates inferior wall ischemia. She is s/p CABG and has known occlusion of the SVG to intermediate.EF was normal by Echo.  Procedural Details:  The left wrist was prepped, draped, and anesthetized with 1% lidocaine. Using the modified Seldinger technique, a 6 French slender sheath was introduced into the left radial artery. 3 mg of verapamil was administered through the sheath, weight-based unfractionated heparin was administered intravenously. Standard Judkins catheters were used for selective coronary and graft angiography and left ventricular pressures. Catheter exchanges were performed over an exchange length guidewire.  PROCEDURAL FINDINGS Hemodynamics: AO 192/85 mean 127 mm Hg LV 187/14 mm Hg    Coronary angiography: Coronary dominance: right  Left mainstem: The left main is calcified with 50% distal left main stenosis.  Left anterior descending (LAD): The LAD is diffusely diseased in the proximal vessel up to 80%. There is an 80-90% stenosis in the mid vessel. The LAD fills by LIMA.   The ramus intermediate artery has an 80-90% stenosis proximally.   Left circumflex (LCx): The LCx gives rise to a single OM and is without significant disease.   Right coronary artery (RCA): The RCA has a long segment of 70% disease in the mid vessel. There is a long segmental 95% stenosis in the distal RCA.  The SVG to the PDA is widely patent and supplies the PLOM. There is mild disease in the mid vein graft to 30%.   The SVG to the intermediate is chronically occluded.  The SVG to the diagonal has a 90% stenosis in the proximal body of the graft.    The LIMA to the LAD is widely patent.   Left ventriculography: Not done.  PCI Note:  Following the diagnostic procedure, the decision was made to proceed with PCI of the SVG to the diagonal. This was felt to be her culprit vessel.  Weight-based bivalirudin was given for anticoagulation. Once a therapeutic ACT was achieved, a 6 [place] LA1 guide catheter was inserted.  A filter 3.5-5.0 coronary guidewire was used to cross the lesion and the filter deployed in the distal vein graft.  The lesion was predilated with a 2.5 mm balloon.  The lesion was then stented with a 3.5 x 23 mm Xience Alpine stent depolyed at 12 atm with the stent balloon. The filter was then retrieved without difficulty. Following PCI, there was 0% residual stenosis and TIMI-3 flow. Final angiography confirmed an excellent result. The patient tolerated the procedure well. There were no immediate procedural complications. A TR band was used for radial hemostasis. The patient was transferred to the post catheterization recovery area for further monitoring.  PCI Data: Vessel - SVG to the diagonal/Segment - proximal Percent Stenosis (pre)  90% TIMI-flow 3 Stent 3.5 x 23 mm Xience Alpine Percent Stenosis (post) 0% TIMI-flow (post) 3  Final Conclusions:   1. Severe 3 vessel obstructive CAD 2. Patent LIMA to the LAD. 3. Patent SVG to PDA 4. Patent but severely stenosed SVG to the diagonal. 5. Chronic occlusion of the SVG to the intermediate.  6. Successful stenting of the SVG to the diagonal with a DES.   Recommendations:  Continue dual antiplatelet therapy for at  least one year. If the patient has refractory angina could consider PCI of the ramus intermediate branch. We did not address this lesion since I did not think this was the culprit and because of limiting contrast load with her CKD.  Peter Martinique, [place] 09/28/2013, 9:07 AM

## 2013-09-28 NOTE — Care Management Note (Addendum)
  Page 1 of 1   09/29/2013     5:39:41 PM CARE MANAGEMENT NOTE 09/29/2013  Patient:  Brooke Stafford, Brooke Stafford   Account Number:  [id number]  Date Initiated:  09/28/2013  Documentation initiated by:  Mariann Laster  Subjective/Objective Assessment:   angina pectoris     Action/Plan:   CM to follow for dispositon needs   Anticipated DC Date:  09/28/2013   Anticipated DC Plan:  HOME/SELF CARE         Choice offered to / List presented to:             Status of service:  Completed, signed off Medicare Important Message given?   (If response is "NO", the following Medicare IM given date fields will be blank) Date Medicare IM given:   Date Additional Medicare IM given:    Discharge Disposition:  HOME/SELF CARE  Per UR Regulation:  Reviewed for med. necessity/level of care/duration of stay  If discussed at [place] of Stay Meetings, dates discussed:    Comments:  Crystal Hutchinson RN, BSN, MSHL, CCM  Nurse - Case Manager, (Unit [phone]  09/28/2013 Procedure: Left Heart Cath, Selective Coronary Angiography, SVG and LIMA angiography, PTCA and stenting of the SVG to the diagonal on 09/28/2013 Med Review: Plavix

## 2013-09-28 NOTE — Interval H&P Note (Signed)
History and Physical Interval Note:  09/28/2013 7:55 AM  Brooke Stafford  has presented today for surgery, with the diagnosis of abnormal stress test  The various methods of treatment have been discussed with the patient and family. After consideration of risks, benefits and other options for treatment, the patient has consented to  Procedure(s): LEFT HEART CATHETERIZATION WITH CORONARY/GRAFT ANGIOGRAM (N/A) as a surgical intervention .  The patient's history has been reviewed, patient examined, no change in status, stable for surgery.  I have reviewed the patient's chart and labs.  Questions were answered to the patient's satisfaction.    Cath Lab Visit (complete for each Cath Lab visit)  Clinical Evaluation Leading to the Procedure:   ACS: no  Non-ACS:    Anginal Classification: CCS III  Anti-ischemic medical therapy: Maximal Therapy (2 or more classes of medications)  Non-Invasive Test Results: Low-risk stress test findings: cardiac mortality <1%/year  Prior CABG: Previous CABG       [name] 09/28/2013 7:55 AM

## 2013-09-29 DIAGNOSIS — T82897A Other specified complication of cardiac prosthetic devices, implants and grafts, initial encounter: Secondary | ICD-10-CM | POA: Diagnosis not present

## 2013-09-29 DIAGNOSIS — E1129 Type 2 diabetes mellitus with other diabetic kidney complication: Secondary | ICD-10-CM | POA: Diagnosis not present

## 2013-09-29 DIAGNOSIS — I200 Unstable angina: Secondary | ICD-10-CM

## 2013-09-29 DIAGNOSIS — N184 Chronic kidney disease, stage 4 (severe): Secondary | ICD-10-CM | POA: Diagnosis not present

## 2013-09-29 DIAGNOSIS — N183 Chronic kidney disease, stage 3 unspecified: Secondary | ICD-10-CM

## 2013-09-29 DIAGNOSIS — I209 Angina pectoris, unspecified: Secondary | ICD-10-CM | POA: Diagnosis not present

## 2013-09-29 DIAGNOSIS — I2510 Atherosclerotic heart disease of native coronary artery without angina pectoris: Secondary | ICD-10-CM | POA: Diagnosis not present

## 2013-09-29 LAB — CBC
HCT: 33.5 % — ABNORMAL LOW (ref 36.0–46.0)
Hemoglobin: 11.3 g/dL — ABNORMAL LOW (ref 12.0–15.0)
MCH: 30.5 pg (ref 26.0–34.0)
MCHC: 33.7 g/dL (ref 30.0–36.0)
MCV: 90.5 fL (ref 78.0–100.0)
PLATELETS: 227 10*3/uL (ref 150–400)
RBC: 3.7 MIL/uL — AB (ref 3.87–5.11)
RDW: 13.5 % (ref 11.5–15.5)
WBC: 6 10*3/uL (ref 4.0–10.5)

## 2013-09-29 LAB — GLUCOSE, CAPILLARY: Glucose-Capillary: 298 mg/dL — ABNORMAL HIGH (ref 70–99)

## 2013-09-29 LAB — BASIC METABOLIC PANEL
BUN: 18 mg/dL (ref 6–23)
CALCIUM: 9.3 mg/dL (ref 8.4–10.5)
CO2: 23 mEq/L (ref 19–32)
CREATININE: 1.21 mg/dL — AB (ref 0.50–1.10)
Chloride: 103 mEq/L (ref 96–112)
GFR calc Af Amer: 56 mL/min — ABNORMAL LOW (ref >90)
GFR calc non Af Amer: 48 mL/min — ABNORMAL LOW (ref >90)
GLUCOSE: 241 mg/dL — AB (ref 70–99)
Potassium: 4.2 mEq/L (ref 3.7–5.3)
SODIUM: 137 meq/L (ref 137–147)

## 2013-09-29 MED ORDER — ASPIRIN 81 MG PO CHEW: 81.0000 mg | CHEWABLE_TABLET | Freq: Every day | ORAL | Status: DC

## 2013-09-29 MED FILL — Sodium Chloride IV Soln 0.9%: INTRAVENOUS | Qty: 50 | Status: AC

## 2013-09-29 MED FILL — Clopidogrel Bisulfate Tab 75 MG (Base Equiv): ORAL | Qty: 1 | Status: AC

## 2013-09-29 NOTE — Progress Notes (Signed)
CARDIAC REHAB PHASE I   PRE:  Rate/Rhythm: 68 SR  BP:  Supine: 169/76  Sitting:   Standing:    SaO2:   MODE:  Ambulation: 500 ft   POST:  Rate/Rhythm: 83 SR  BP:  Supine:   Sitting: 203/90, 200/88, 183/85  Standing:    SaO2:  [id number] Pt walked 500 ft on RA with asst x 1 with steady gait. Usually walks with cane. No CP. Education completed with pt who voiced understanding. Pt verbalized carb counting and stated she is going to see endocrinologist to try to get [facility] better. BP elevated and pt knows to watch sodium. Notified RN of elevated BP. Pt has not had morning BP meds. Discussed CRP 2 and pt gave permission to refer to [place]. Reviewed NTG use.   Graylon Good, RN BSN  09/29/2013 9:17 AM

## 2013-09-29 NOTE — Progress Notes (Signed)
Patient Profile: 58 year old African American Stafford with a history of CAD, status post CABG with known occlusion of the saphenous vein graft to intermediate, insulin-dependent diabetes, PVD, hypertension, obesity and hyperlipidemia who was referred for a left heart catheterization in the setting of chest pain (class III) and an abnormal Myoview stress test demonstrating inferior wall ischemia.  Subjective: No complaints. Denies chest pain or SOB. Left wrist is ok.   Objective: Vital signs in last 24 hours: Temp:  [97.6 F (36.4 C)-98.7 F (37.1 C)] 97.8 F (36.6 C) (06/17 0347) Pulse Rate:  [62-84] 63 (06/17 0347) Resp:  [16-20] 20 (06/17 0347) BP: (121-212)/(39-97) 160/65 mmHg (06/17 0347) SpO2:  [96 %-98 %] 96 % (06/17 0347) Weight:  [251 lb 5.2 oz (114 kg)] 251 lb 5.2 oz (114 kg) (06/17 0027) Last BM Date: 09/27/13  Intake/Output from previous day: 06/16 0701 - 06/17 0700 In: 2080.8 [P.O.:720; I.V.:1360.8] Out: -  Intake/Output this shift: Total I/O In: 1840.8 [P.O.:480; I.V.:1360.8] Out: -   Medications Current Facility-Administered Medications  Medication Dose Route Frequency Provider Last Rate Last Dose  . acetaminophen (TYLENOL) tablet 650 mg  650 mg Oral Q4H PRN Rhonda G Barrett, PA-C      . albuterol (PROVENTIL) (2.5 MG/3ML) 0.083% nebulizer solution 3 mL  3 mL Inhalation Q6H PRN Rhonda G Barrett, PA-C      . allopurinol (ZYLOPRIM) tablet 150 mg  150 mg Oral Daily Rhonda G Barrett, PA-C   150 mg at 09/28/13 1513  . ALPRAZolam Duanne Moron) tablet 0.25 mg  0.25 mg Oral BID PRN Evelene Croon Barrett, PA-C   0.25 mg at 09/28/13 2253  . amLODipine (NORVASC) tablet 10 mg  10 mg Oral Daily Rhonda G Barrett, PA-C   10 mg at 09/28/13 1513  . aspirin chewable tablet 81 mg  81 mg Oral Daily Peter M Martinique, MD      . atorvastatin (LIPITOR) tablet 40 mg  40 mg Oral Daily Evelene Croon Barrett, PA-C   40 mg at 09/28/13 1518  . cloNIDine (CATAPRES) tablet 0.2 mg  0.2 mg Oral TID Evelene Croon Barrett,  PA-C   0.2 mg at 09/28/13 2140  . clopidogrel (PLAVIX) tablet 75 mg  75 mg Oral Daily Rhonda G Barrett, PA-C   75 mg at 09/27/13 1726  . ezetimibe (ZETIA) tablet 10 mg  10 mg Oral Daily Rhonda G Barrett, PA-C   10 mg at 09/28/13 1518  . fluticasone (FLONASE) 50 MCG/ACT nasal spray 1 spray  1 spray Each Nare Daily PRN Rhonda G Barrett, PA-C      . gabapentin (NEURONTIN) capsule 400 mg  400 mg Oral Daily Rhonda G Barrett, PA-C   400 mg at 09/28/13 1512  . glimepiride (AMARYL) tablet 4 mg  4 mg Oral Q breakfast Evelene Croon Barrett, PA-C   4 mg at 09/28/13 1518  . insulin aspart (novoLOG) injection 0-15 Units  0-15 Units Subcutaneous TID WC Evelene Croon Barrett, PA-C   5 Units at 09/28/13 1925  . insulin glargine (LANTUS) injection 70 Units  70 Units Subcutaneous QHS Evelene Croon Barrett, PA-C   70 Units at 09/28/13 2140  . isosorbide mononitrate (IMDUR) 24 hr tablet 30 mg  30 mg Oral Daily Rhonda G Barrett, PA-C   30 mg at 09/28/13 1510  . levothyroxine (SYNTHROID, LEVOTHROID) tablet 25 mcg  25 mcg Oral QAC breakfast Evelene Croon Barrett, PA-C   25 mcg at 09/28/13 1510  . linagliptin (TRADJENTA) tablet 5 mg  5 mg Oral Daily Suanne Marker  G Barrett, PA-C   5 mg at 09/28/13 1518  . loratadine (CLARITIN) tablet 10 mg  10 mg Oral Daily Rhonda G Barrett, PA-C   10 mg at 09/28/13 1511  . metoprolol succinate (TOPROL-XL) 24 hr tablet 100 mg  100 mg Oral Daily Rhonda G Barrett, PA-C   100 mg at 09/28/13 1511  . nitroGLYCERIN (NITROSTAT) SL tablet 0.4 mg  0.4 mg Sublingual Q5 Min x 3 PRN Rhonda G Barrett, PA-C      . ondansetron (ZOFRAN) injection 4 mg  4 mg Intravenous Q6H PRN Rhonda G Barrett, PA-C      . polyethylene glycol (MIRALAX / GLYCOLAX) packet 17 g  17 g Oral BID PRN Peter M Martinique, MD      . traMADol Veatrice Bourbon) tablet 50-100 mg  50-100 mg Oral Q6H PRN Evelene Croon Barrett, PA-C   50 mg at 09/28/13 2254  . traZODone (DESYREL) tablet 25-50 mg  25-50 mg Oral QHS PRN Rhonda G Barrett, PA-C      . zolpidem (AMBIEN) tablet 5 mg  5  mg Oral QHS PRN Lonn Georgia, PA-C        PE: General appearance: alert, cooperative, no distress and morbidly obese Lungs: clear to auscultation bilaterally Heart: regular rate and rhythm, S1, S2 normal, no murmur, click, rub or gallop Extremities: no [name] Pulses: 2+ and symmetric Skin: warm and dry Neurologic: Grossly normal  Lab Results:   Recent Labs  09/27/13 1345 09/29/13 0340  WBC 6.4 6.0  HGB 12.0 11.3*  HCT 34.8* 33.5*  PLT 254 227   BMET  Recent Labs  09/27/13 1345 09/28/13 0550 09/29/13 0340  NA 138 141 137  K 3.8 3.9 4.2  CL 101 103 103  CO2 27 25 23   GLUCOSE 241* 242* 241*  BUN 24* 21 18  CREATININE 1.32* 1.33* 1.21*  CALCIUM 10.7* 10.4 9.3   PT/INR  Recent Labs  09/27/13 1345  LABPROT 12.8  INR 0.98    Studies/Results:  LHC 09/28/13 Final Conclusions:  1. Severe 3 vessel obstructive CAD  2. Patent LIMA to the LAD.  3. Patent SVG to PDA  4. Patent but severely stenosed SVG to the diagonal.  5. Chronic occlusion of the SVG to the intermediate.  6. Successful stenting of the SVG to the diagonal with a DES.    Assessment/Plan  Principal Problem:   Angina pectoris Active Problems:   Diabetes mellitus with chronic kidney disease   Dyslipidemia   Essential hypertension, benign   Unspecified hypothyroidism   Unstable angina  1. CAD: LHC data listed above. S/p PCI + stenting of a 90% stenosis saphenous vein graft to diagonal. If the patient has refractory angina, could consider possible PCI of the ramus intermediate branch. Continue dual antiplatelet therapy with aspirin plus Plavix for at least one year, in the setting of a newly placed drug-eluting stent. Continue beta blocker, long acting nitrate and statin. She has been on long-term Plavix therapy. A P2Y12 was checked yesterday to assess degree of platelet inhibition. She is responsive to Plavix.  2. Hypertension: Moderately elevated this morning however morning meds not yet given.  Continue amlodipine, clonidine, Imdur and metoprolol.  3. Hyperlipidemia: Recent lipid panel 08/04/2013 demonstrated LDL at 99 mg/dL.  Her goal, in the setting of CAD is less than 70. Triglycerides were also elevated at 230. Recommend continuing statin therapy along with Zetia. Will reassess in several months as an outpatient.  4. Diabetes: Poorly controlled. Hemoglobin A1c is 10.5. Desirable  goal is less than 7. Will refer her back to her PCP for further management as an outpatient.  5. Obesity: The importance of weight loss for overall cardiovascular health was discussed.  6. Post catheterization: left radial access site is stable, w/o hematoma. 2+ radial pulse. Renal function is stable. She is not on Metformin.    Dispo: likely discharge home today if cleared by MD.    LOS: 2 days    Brittainy M. Ladoris Gene 09/29/2013 6:16 AM  Patient seen and examined and history reviewed. Agree with above findings and plan. Feels very well today. No chest pain. Anxiety is less. Has appt. To see a diabetic specialist to help manage her diabetes. Continue ASA and plavix. OK to DC today.  Peter Martinique, [place] 09/29/2013 7:36 AM

## 2013-09-29 NOTE — Discharge Summary (Signed)
Physician Discharge Summary  Patient ID: Brooke Stafford MRN: [id number] DOB/AGE: 1955-12-03 58 y.o.  Admit date: 09/27/2013 Discharge date: 09/29/2013  Admission Diagnoses: Angina + abnormal nuclear stress test  Discharge Diagnoses:  Principal Problem:   Angina pectoris Active Problems:   Diabetes mellitus with chronic kidney disease   Dyslipidemia   Essential hypertension, benign   Unspecified hypothyroidism   CKD 4, baseline creatinine around 1.4   Unstable angina   Discharged Condition: stable  Hospital Course: The patient is a 58 year old female. Dr. Sallyanne Kuster follows her cardiac issues however Dr. Gwenlyn Found follows her peripheral vascular disease. She has a past medical history significant for severe type 2 diabetes mellitus requiring insulin therapy, renal insufficiency, systemic hypertension, hyperlipidemia, obesity, PVD S/P LSFA PTA initally in 2012 and again in 2014, coronary artery disease, S/P CABG x 6 in 2002. Repeat cardiac catheterization was done in July 2011 when she presented with sepsis and medication non compliance and had a NSTEMI from demand ischemia. Cath then showed a patent LIMA to LAD, patent SVG to diagonal, and patent sequential SVG to branches of the right coronary artery. The plan was for medical Rx. She a h/o mild ischemic cardiomyopathy with a left ventricular ejection fraction of 45% to 50% by echo in 5/11.   She was seen recently in clinic and complained of frequent episodes of substernal chest pain, exacerbated by exertional activities and emotional distress and relieved by sublingual nitroglycerin. Subsequently, she was ordered to undergo nuclear stress testing, which demonstrated inferior ischemia. She was referred for a left heart catheterization for definitive assessment of her coronaries. Due to renal insufficiency, it was recommended that she be admitted the night prior to the procedure to undergo hydration with IV fluids.  She presented to [facility] on 08/27/2013 for hydration. The procedure was performed the following day by Dr. Martinique. Vascular access was gained via the left radial artery. She was found to have severe three-vessel obstructive CAD. Her LIMA to the LAD was widely patent as well as the SVG to PDA. She was noted to have a patent but severely stenosis SVG to the diagonal. There was chronic occlusion of the SVG to the intermediate. She underwent successful stenting of the SVG to the diagonal utilizing a drug-eluting stent.  It was felt that if she had refractory angina then possible PCI to the ramus intermediate branch could be considered at a later time (this lesion was not addressed at time of cath as it was felt this was not the culprit lesion and also in an effort to limit contrast load with her CKD). Left ventriculography was also not performed. She tolerated the procedure well. She left the Cath Lab in stable condition. She was continued on dual antiplatelet therapy with aspirin plus Plavix. As she had been on chronic Plavix therapy, a P2Y12 was checked to assess degree of platelet inhibition. Her level was 174, indicating that she is responsive to Plavix. She was also continued on her beta blocker and long acting oral nitrate. Her statin along with Zetia were also continued for her hyperlipidemia. It should be noted that a hemoglobin A1c was checked and her diabetes is poorly controlled. Her hemoglobin A1c was 10.2. She is scheduled to follow up her PCP as well as a new diabetes specialist later this month.   Brooke Stafford had no post-cath complications. She denied recurrent chest pain and shortness of breath. Her left radial access site remained stable free of hematoma. She had a 2+ left  radial pulse. Heart rate and blood pressure remained stable. She was hydrated post cath with IV fluids and her renal function remained stable. Serum creatinine day of discharge was 1.21 (not on Metformin therapy). She had no difficulties ambulating.  She was last seen and examined by Dr. Martinique, who determined that she was stable for discharge home. She is scheduled for post hospital followup with Lyda Jester, PA-C, on 10/13/2013.   Consults: None  Significant Diagnostic Studies:   Left heart catheterization 09/28/2013 Final Conclusions:  1. Severe 3 vessel obstructive CAD  2. Patent LIMA to the LAD.  3. Patent SVG to PDA  4. Patent but severely stenosed SVG to the diagonal.  5. Chronic occlusion of the SVG to the intermediate.  6. Successful stenting of the SVG to the diagonal with a DES.    Treatments: See hospital course  Discharge Exam: Blood pressure 160/65, pulse 63, temperature 97.8 F (36.6 C), temperature source Oral, resp. rate 20, height 5' 10.5" (1.791 m), weight 251 lb 5.2 oz (114 kg), SpO2 96.00%.   Disposition: 01-Home or Self Care      Discharge Instructions   Diet - low sodium heart healthy    Complete by:  As directed      Increase activity slowly    Complete by:  As directed             Medication List         albuterol 108 (90 BASE) MCG/ACT inhaler  Commonly known as:  PROVENTIL HFA;VENTOLIN HFA  Inhale 2 puffs into the lungs every 6 (six) hours as needed. For shortness of breath     allopurinol 300 MG tablet  Commonly known as:  ZYLOPRIM  Take 150 mg by mouth daily.     amLODipine 10 MG tablet  Commonly known as:  NORVASC  Take 1 tablet (10 mg total) by mouth daily.     aspirin 81 MG chewable tablet  Chew 1 tablet (81 mg total) by mouth daily.     atorvastatin 40 MG tablet  Commonly known as:  LIPITOR  Take 1 tablet (40 mg total) by mouth daily.     cloNIDine 0.2 MG tablet  Commonly known as:  CATAPRES  Take 1 tablet (0.2 mg total) by mouth 3 (three) times daily.     clopidogrel 75 MG tablet  Commonly known as:  PLAVIX  Take 1 tablet (75 mg total) by mouth daily.     ezetimibe 10 MG tablet  Commonly known as:  ZETIA  Take 1 tablet (10 mg total) by mouth daily.      fluticasone 50 MCG/ACT nasal spray  Commonly known as:  FLONASE  Place 1 spray into both nostrils daily as needed for rhinitis or allergies.     gabapentin 400 MG capsule  Commonly known as:  NEURONTIN  Take 1 capsule (400 mg total) by mouth daily.     glimepiride 4 MG tablet  Commonly known as:  AMARYL  Take 1 tablet (4 mg total) by mouth daily with breakfast.     insulin glargine 100 UNIT/ML injection  Commonly known as:  LANTUS  Inject 0.7 mLs (70 Units total) into the skin at bedtime.     Insulin Syringes (Disposable) U-100 1 ML Misc  For Lantus insulin     isosorbide mononitrate 30 MG 24 hr tablet  Commonly known as:  IMDUR  Take 1 tablet (30 mg total) by mouth daily.     levothyroxine 25 MCG tablet  Commonly known as:  SYNTHROID, LEVOTHROID  Take 1 tablet (25 mcg total) by mouth daily before breakfast.     linagliptin 5 MG Tabs tablet  Commonly known as:  TRADJENTA  Take 1 tablet (5 mg total) by mouth daily.     loratadine 10 MG tablet  Commonly known as:  CLARITIN  Take 1 tablet (10 mg total) by mouth daily.     metoprolol succinate 100 MG 24 hr tablet  Commonly known as:  TOPROL-XL  Take 1 tablet (100 mg total) by mouth daily. Take with or immediately following a meal.     nitroGLYCERIN 0.4 MG SL tablet  Commonly known as:  NITROSTAT  Place 1 tablet (0.4 mg total) under the tongue every 5 (five) minutes as needed. For chest     polyethylene glycol powder powder  Commonly known as:  GLYCOLAX/MIRALAX  Take 17 g by mouth 2 (two) times daily as needed.     traMADol 50 MG tablet  Commonly known as:  ULTRAM  Take 1 tablet (50 mg total) by mouth every 6 (six) hours as needed for moderate pain.     traZODone 50 MG tablet  Commonly known as:  DESYREL  Take 0.5-1 tablets (25-50 mg total) by mouth at bedtime as needed for sleep.       Follow-up Information   Follow up with Lyda Jester, PA-C On 10/13/2013. (10:30 AM )    Specialty:  Cardiology   Contact  information:   [address] [place] [address] [phone]      TIME SPENT ON DISCHARGE, INCLUDING PHYSICIAN TIME: > 30 MINUTES  Signed: SIMMONS, BRITTAINY 09/29/2013, 8:28 AM

## 2013-09-29 NOTE — Discharge Summary (Signed)
Patient seen and examined and history reviewed. Agree with above findings and plan. Please see earlier rounding note.  Peter Martinique, [place] 09/29/2013 9:58 AM

## 2013-10-07 ENCOUNTER — Ambulatory Visit: Payer: Medicare Other | Admitting: Internal Medicine

## 2013-10-13 ENCOUNTER — Ambulatory Visit: Payer: Medicare Other | Admitting: Cardiology

## 2013-10-13 NOTE — Telephone Encounter (Signed)
Encounter complete. 

## 2013-10-14 NOTE — Telephone Encounter (Signed)
Encounter complete. 

## 2013-10-19 DIAGNOSIS — E119 Type 2 diabetes mellitus without complications: Secondary | ICD-10-CM | POA: Diagnosis not present

## 2013-10-19 DIAGNOSIS — M109 Gout, unspecified: Secondary | ICD-10-CM | POA: Diagnosis not present

## 2013-10-19 DIAGNOSIS — M25579 Pain in unspecified ankle and joints of unspecified foot: Secondary | ICD-10-CM | POA: Diagnosis not present

## 2013-10-19 DIAGNOSIS — M25519 Pain in unspecified shoulder: Secondary | ICD-10-CM | POA: Diagnosis not present

## 2013-10-21 ENCOUNTER — Encounter: Payer: Self-pay | Admitting: Internal Medicine

## 2013-10-21 ENCOUNTER — Ambulatory Visit (INDEPENDENT_AMBULATORY_CARE_PROVIDER_SITE_OTHER): Payer: Medicare Other | Admitting: Internal Medicine

## 2013-10-21 VITALS — BP 126/76 | HR 68 | Temp 98.3°F | Resp 12 | Ht 70.5 in | Wt 247.0 lb

## 2013-10-21 DIAGNOSIS — N189 Chronic kidney disease, unspecified: Secondary | ICD-10-CM

## 2013-10-21 DIAGNOSIS — E1129 Type 2 diabetes mellitus with other diabetic kidney complication: Secondary | ICD-10-CM

## 2013-10-21 DIAGNOSIS — I200 Unstable angina: Secondary | ICD-10-CM

## 2013-10-21 DIAGNOSIS — E1122 Type 2 diabetes mellitus with diabetic chronic kidney disease: Secondary | ICD-10-CM

## 2013-10-21 MED ORDER — METFORMIN HCL 1000 MG PO TABS: 1000.0000 mg | ORAL_TABLET | Freq: Every day | ORAL | Status: DC

## 2013-10-21 MED ORDER — INSULIN GLARGINE 100 UNIT/ML ~~LOC~~ SOLN: 60.0000 [IU] | Freq: Every day | SUBCUTANEOUS | Status: DC

## 2013-10-21 MED ORDER — INSULIN ASPART 100 UNIT/ML FLEXPEN: PEN_INJECTOR | SUBCUTANEOUS | Status: DC

## 2013-10-21 NOTE — Progress Notes (Signed)
Patient ID: Brooke Stafford, female   DOB: 07-23-1955, 58 y.o.   MRN: [id number]  HPI: Brooke Stafford is a 58 y.o.-year-old female, referred by her PCP, Dr. Doreene Burke, for management of DM2, insulin-dependent, uncontrolled, with complications (CKD, iCMP, CAD - s/p CABG x 6, 2002, PN).  Patient has been diagnosed with diabetes in 2006; she started insulin in 2012.  Last hemoglobin A1c was: Lab Results  Component Value Date   HGBA1C 10.5* 09/27/2013   HGBA1C 10.7* 08/04/2013   HGBA1C 10.2 06/02/2013   She had a steroid inj for gout ion 10/19/2013.  Pt is on a regimen of: - Lantus 70 units qhs - bottle - Novolog 70 units bid ac - 30 min before the meal - pen  - Glimepiride 4 mg in am - Tradjenta 5 mg daily  Pt checks her sugars 2-3 a day and they are: - am: high 100s-low 200s - 2h after b'fast: 240-250 - before lunch: 240-250 - 2h after lunch: 160-170 - before dinner: 140 - 2h after dinner: high 100s-215 No lows. Lowest sugar was  80; she has hypoglycemia awareness at 100.  Highest sugar was 250.  Pt's meals are: - Breakfast: toast 2 slices - Lunch: sandwich, fruit - Dinner: meat, veggies, 1 starch - Snacks: 2: fruit or sandwich, diet Pepsi, 1/2 banana, PB crackers  - no CKD, last BUN/creatinine:  Lab Results  Component Value Date   BUN 18 09/29/2013   CREATININE 1.21* 09/29/2013  Not on ACEI.  - last set of lipids: Lab Results  Component Value Date   CHOL 180 08/04/2013   HDL 35* 08/04/2013   LDLCALC 99 08/04/2013   TRIG 230* 08/04/2013   CHOLHDL 5.1 08/04/2013  On Zetia and Lipitor. Takes 1 ASA a day (81 mg) - last eye exam was in 08/2013. No DR reportedly - ?. She has mild glaucoma.  - + numbness and tingling in her feet. She has PN >> takes gabapentin 400 mg 1-2x a day.  Pt has FH of DM in mother, father, uncles, aunts.  ROS: Constitutional: + weight gain, + fatigue, + hot flushes, + poor sleep, + nocturia Eyes: no blurry vision, no xerophthalmia ENT: + sore throat,  no nodules palpated in throat, no dysphagia/odynophagia, no hoarseness, + tinnitus, + hypoacusis Cardiovascular: + CP/+ SOB/no palpitations/+ leg swelling Respiratory: no cough/+ SOB/+ wheezing Gastrointestinal: + N/no V/D/+ C, no heartburn Musculoskeletal: + muscle aches/+ joint aches Skin: no rashes, + itching Neurological: no tremors/numbness/tingling/dizziness, + HA Psychiatric: + both: depression/anxiety   Past Medical History  Diagnosis Date  . Asthma   . Anxiety   . GERD (gastroesophageal reflux disease)   . Coronary artery disease 2002    CABG x 6. Cath 5/11- med Rx  . Hypertension   . Peripheral vascular disease 12/12    LSFA PTA  . Anemia   . Hyperlipidemia   . Chronic renal insufficiency, stage II (mild)     followed  by [facility]  . CAD (coronary artery disease) 2002    CABG x 6 2002, cath 2011- med Rx  . Hypothyroid     treated  . Obesity (BMI 35.0-39.9 without comorbidity)   . CHF (congestive heart failure)     "in 2002" (11/26/2012)  . Myocardial infarction 2000; 2002; 2011?  [name] Chronic bronchitis     "q year; in the winter" (08/04/2013)  . Type II diabetes mellitus   . Depression   . Pneumonia     "3 times I  think" (08/04/2013)  . History of blood transfusion 2002    "when I had OHS"  . Migraines     "couple times/year" (08/04/2013)  . Headache     "~ q week" (08/04/2013)  . Arthritis     "stiff fingers and knees" (08/04/2013)  . Gout     "right big toe"  . Anginal pain    Past Surgical History  Procedure Laterality Date  . Cholecystectomy  1982  . Cesarean section  1978; 1980  . Tubal ligation  1980  . Breast cyst excision Right 1970's  . Coronary artery bypass graft  11/20/2000    x6 LIMA to distal LAD, svg to first diag, svg to ramus intermediate branch and swquential SVG to cir marginal branch, SVG to posterior descending coronary and sequential SVG to first right posterolateral branch  . Cardiac catheterization  2002  . Coronary angioplasty  with stent placement  2004; 2012    "I have 2 stents" (08/04/2013)  . Peripheral arterial stent graft Left     SFA/notes 04/07/2011 (11/30/2012)  . Nm myocar perf wall motion  08/27/2004    negative  . Appendectomy  1980   History   Social History  . Marital Status: Divorced    Spouse Name: N/A    Number of Children: 2   Occupational History  . On disability   Social History Main Topics  . Smoking status: Former Smoker -- 1.00 packs/day for 25 years    Types: Cigarettes    Quit date: 04/04/2000  . Smokeless tobacco: Never Used  . Alcohol Use: Yes     Comment: 08/04/2013  "have a glass of red wine on my birthday q yr; that's it"  . Drug Use: No   Current Outpatient Prescriptions on File Prior to Visit  Medication Sig Dispense Refill  . albuterol (PROVENTIL HFA;VENTOLIN HFA) 108 (90 BASE) MCG/ACT inhaler Inhale 2 puffs into the lungs every 6 (six) hours as needed. For shortness of breath  1 Inhaler  3  . allopurinol (ZYLOPRIM) 300 MG tablet Take 150 mg by mouth daily.      [name] amLODipine (NORVASC) 10 MG tablet Take 1 tablet (10 mg total) by mouth daily.  30 tablet  2  . aspirin 81 MG chewable tablet Chew 1 tablet (81 mg total) by mouth daily.      [name] atorvastatin (LIPITOR) 40 MG tablet Take 1 tablet (40 mg total) by mouth daily.  90 tablet  3  . cloNIDine (CATAPRES) 0.2 MG tablet Take 1 tablet (0.2 mg total) by mouth 3 (three) times daily.  90 tablet  1  . clopidogrel (PLAVIX) 75 MG tablet Take 1 tablet (75 mg total) by mouth daily.  30 tablet  6  . ezetimibe (ZETIA) 10 MG tablet Take 1 tablet (10 mg total) by mouth daily.  90 tablet  3  . fluticasone (FLONASE) 50 MCG/ACT nasal spray Place 1 spray into both nostrils daily as needed for rhinitis or allergies.  16 g  2  . gabapentin (NEURONTIN) 400 MG capsule Take 1 capsule (400 mg total) by mouth daily.  90 capsule  3  . Insulin Syringes, Disposable, U-100 1 ML MISC For Lantus insulin  100 each  3  . isosorbide mononitrate (IMDUR) 30 MG  24 hr tablet Take 1 tablet (30 mg total) by mouth daily.  90 tablet  3  . levothyroxine (SYNTHROID, LEVOTHROID) 25 MCG tablet Take 1 tablet (25 mcg total) by mouth daily before breakfast.  30  tablet  5  . loratadine (CLARITIN) 10 MG tablet Take 1 tablet (10 mg total) by mouth daily.  30 tablet  11  . metoprolol succinate (TOPROL-XL) 100 MG 24 hr tablet Take 1 tablet (100 mg total) by mouth daily. Take with or immediately following a meal.  90 tablet  3  . nitroGLYCERIN (NITROSTAT) 0.4 MG SL tablet Place 1 tablet (0.4 mg total) under the tongue every 5 (five) minutes as needed. For chest  30 tablet  6  . polyethylene glycol powder (GLYCOLAX/MIRALAX) powder Take 17 g by mouth 2 (two) times daily as needed.  3350 g  3  . traMADol (ULTRAM) 50 MG tablet Take 1 tablet (50 mg total) by mouth every 6 (six) hours as needed for moderate pain.  60 tablet  0  . traZODone (DESYREL) 50 MG tablet Take 0.5-1 tablets (25-50 mg total) by mouth at bedtime as needed for sleep.  60 tablet  3   No current facility-administered medications on file prior to visit.   Allergies  Allergen Reactions  . Hydralazine Shortness Of Breath  . Penicillins Cross Reactors Hives    And high fever  . Adhesive [Tape] Rash    bruising   Family History  Problem Relation Age of Onset  . Diabetes Mother   . Hypertension Mother   . Stroke Mother   . Hypertension Father   . Hypertension Brother   . Hypertension Sister   . Diabetes Sister   . Hyperlipidemia Sister    PE: BP 126/76  Pulse 68  Temp(Src) 98.3 F (36.8 C) (Oral)  Resp 12  Ht 5' 10.5" (1.791 m)  Wt 247 lb (112.038 kg)  BMI 34.93 kg/m2  SpO2 95% Wt Readings from Last 3 Encounters:  10/21/13 247 lb (112.038 kg)  09/29/13 251 lb 5.2 oz (114 kg)  09/29/13 251 lb 5.2 oz (114 kg)   Constitutional: obese, in NAD Eyes: PERRLA, EOMI, no exophthalmos ENT: moist mucous membranes, no thyromegaly, no cervical lymphadenopathy Cardiovascular: RRR, No MRG, + R ankle  swelling - gout Respiratory: CTA B Gastrointestinal: abdomen soft, NT, ND, BS+ Musculoskeletal: no deformities, strength intact in all 4 Skin: moist, warm, no rashes, + hirsutism Neurological: no tremor with outstretched hands, DTR normal in all 4  ASSESSMENT: 1. DM2, insulin-dependent, uncontrolled, with complications - CKD - iCMP, CAD - s/p CABG x 6, 2002, s/p stent 2006, s/p stent 09/2013 - PN  PLAN:  1. Patient with long-standing, uncontrolled diabetes, on basal-bolus injectable and oral antidiabetic regimen, which became insufficient - We discussed about options for treatment, and I suggested to:  Patient Instructions  Please stop Tradjenta and Glimepiride. Add metformin 1/2 tablet at dinnertime and after 3 days increase to 1000 mg (1 whole tablet) at dinnertime. Decrease Lantus to 60 units at bedtime. Please take NovoLog as follows: 30 units before breakfast 20 units before lunch 35 units before dinner Inject NovoLog 15 min before a meal. When injecting insulin:  Inject in the abdomen  Rotate the injection sites around the belly button  Change needle for each injection  Keep needle in for 10 sec after last unit of insulin in  Keep the insulin in use out of the fridge  Please return in 1 month with your sugar log.   - Strongly advised her to start checking sugars at different times of the day - check 3 times a day, rotating checks - given sugar log and advised how to fill it and to bring it at next  appt  - given foot care handout and explained the principles  - given instructions for hypoglycemia management "15-15 rule"  - advised for yearly eye exams - refilled insulins - will need to check BMP at next visit to ensure BUN/Cr stable after starting Metformin - Return to clinic in 1 mo with sugar log

## 2013-10-21 NOTE — Patient Instructions (Addendum)
Please stop Tradjenta and Glimepiride. Add metformin 1/2 tablet at dinnertime and after 3 days increase to 1000 mg (1 whole tablet) at dinnertime. Decrease Lantus to 60 units at bedtime. Please take NovoLog as follows: 30 units before breakfast 20 units before lunch 35 units before dinner Inject NovoLog 15 min before a meal. When injecting insulin:  Inject in the abdomen  Rotate the injection sites around the belly button  Change needle for each injection  Keep needle in for 10 sec after last unit of insulin in  Keep the insulin in use out of the fridge  Please return in 1 month with your sugar log.

## 2013-10-26 DIAGNOSIS — D239 Other benign neoplasm of skin, unspecified: Secondary | ICD-10-CM | POA: Diagnosis not present

## 2013-10-26 DIAGNOSIS — L723 Sebaceous cyst: Secondary | ICD-10-CM | POA: Diagnosis not present

## 2013-11-02 ENCOUNTER — Encounter: Payer: Self-pay | Admitting: Cardiology

## 2013-11-02 ENCOUNTER — Ambulatory Visit (INDEPENDENT_AMBULATORY_CARE_PROVIDER_SITE_OTHER): Payer: Medicare Other | Admitting: Cardiology

## 2013-11-02 VITALS — BP 170/90 | HR 66 | Ht 70.0 in | Wt 243.0 lb

## 2013-11-02 DIAGNOSIS — I209 Angina pectoris, unspecified: Secondary | ICD-10-CM

## 2013-11-02 DIAGNOSIS — I200 Unstable angina: Secondary | ICD-10-CM

## 2013-11-02 DIAGNOSIS — I2510 Atherosclerotic heart disease of native coronary artery without angina pectoris: Secondary | ICD-10-CM

## 2013-11-02 DIAGNOSIS — I25118 Atherosclerotic heart disease of native coronary artery with other forms of angina pectoris: Secondary | ICD-10-CM

## 2013-11-02 MED ORDER — ISOSORBIDE MONONITRATE ER 60 MG PO TB24: 60.0000 mg | ORAL_TABLET | Freq: Every day | ORAL | Status: DC

## 2013-11-02 NOTE — Patient Instructions (Signed)
Increase Imdur to 60 mg daily. Continue all other meds as prescribed. Be sure to take Asprin and Plavix every day. Follow-up with Dr. Sallyanne Kuster in 4-6 weeks

## 2013-11-02 NOTE — Progress Notes (Signed)
Patient ID: Brooke Stafford, female   DOB: 04/13/1956, 58 y.o.   MRN: [id number]    11/02/2013 KIYANNA Stafford   16-Aug-1955  [id number]  Primary Physicia Angelica Chessman, MD Primary Cardiologist: Dr. Sallyanne Kuster   [name] presents to clinic today for post hospital followup. Details regarding her history and recent hospital course are outlined in detail below.  HPI:  The patient is a 58 year old female. Dr. Sallyanne Kuster follows her cardiac issues however Dr. Gwenlyn Found follows her peripheral vascular disease. She has a past medical history significant for severe type 2 diabetes mellitus requiring insulin therapy, renal insufficiency, systemic hypertension, hyperlipidemia, obesity, PVD S/P LSFA PTA initally in 2012 and again in 2014, coronary artery disease, S/P CABG x 6 in 2002. Repeat cardiac catheterization was done in July 2011 when she presented with sepsis and medication non compliance and had a NSTEMI from demand ischemia. Cath then showed a patent LIMA to LAD, patent SVG to diagonal, and patent sequential SVG to branches of the right coronary artery. The plan was for medical Rx. She a h/o mild ischemic cardiomyopathy with a left ventricular ejection fraction of 45% to 50% by echo in 5/11.   She was seen recently in clinic and complained of frequent episodes of substernal chest pain, exacerbated by exertional activities and emotional distress and relieved by sublingual nitroglycerin. Subsequently, she was ordered to undergo nuclear stress testing, which demonstrated inferior ischemia. She was referred for a left heart catheterization for definitive assessment of her coronaries. Due to renal insufficiency, it was recommended that she be admitted the night prior to the procedure to undergo hydration with IV fluids.   She presented to [facility] on 08/27/2013 for hydration. The procedure was performed the following day by Dr. Martinique. Vascular access was gained via the left radial artery. She was  found to have severe three-vessel obstructive CAD. Her LIMA to the LAD was widely patent as well as the SVG to PDA. She was noted to have a patent but severely stenosis SVG to the diagonal. There was chronic occlusion of the SVG to the intermediate. She underwent successful stenting of the SVG to the diagonal utilizing a drug-eluting stent. It was felt that if she had refractory angina then possible PCI to the ramus intermediate branch could be considered at a later time (this lesion was not addressed at time of cath as it was felt this was not the culprit lesion and also in an effort to limit contrast load with her CKD). Left ventriculography was also not performed. She tolerated the procedure well. She left the Cath Lab in stable condition. She was continued on dual antiplatelet therapy with aspirin plus Plavix. As she had been on chronic Plavix therapy, a P2Y12 was checked to assess degree of platelet inhibition. Her level was 174, indicating that she is responsive to Plavix. She was also continued on her beta blocker and long acting oral nitrate. Her statin along with Zetia were also continued for her hyperlipidemia. It should be noted that a hemoglobin A1c was checked and her diabetes is poorly controlled. Her hemoglobin A1c was 10.2. She was scheduled to follow up her PCP as well as a new diabetes specialist. She is now followed by Dr. Elon Jester, and she's had recent changes in her diabetic regimen.  She presents to clinic today for posthospital followup. She states that she has been doing fairly well since discharge. However, she does admit that she has had several mild episodes of recurrent anginal like chest  pain. However, the symptoms are not as severe as her symptoms prior to undergoing PCI. On each occasion, her symptoms have been relieved immediately with sublingual nitroglycerin. Today, she is currently without any symptoms. She denies dyspnea, dizziness, syncope/near-syncope. No orthopnea, PND or  lower extremity edema. She she has been fully compliant with her medications.   Current Outpatient Prescriptions  Medication Sig Dispense Refill  . albuterol (PROVENTIL HFA;VENTOLIN HFA) 108 (90 BASE) MCG/ACT inhaler Inhale 2 puffs into the lungs every 6 (six) hours as needed. For shortness of breath  1 Inhaler  3  . allopurinol (ZYLOPRIM) 300 MG tablet Take 150 mg by mouth daily.      [name] amLODipine (NORVASC) 10 MG tablet Take 1 tablet (10 mg total) by mouth daily.  30 tablet  2  . aspirin 81 MG chewable tablet Chew 1 tablet (81 mg total) by mouth daily.      [name] atorvastatin (LIPITOR) 40 MG tablet Take 1 tablet (40 mg total) by mouth daily.  90 tablet  3  . cloNIDine (CATAPRES) 0.2 MG tablet Take 1 tablet (0.2 mg total) by mouth 3 (three) times daily.  90 tablet  1  . clopidogrel (PLAVIX) 75 MG tablet Take 1 tablet (75 mg total) by mouth daily.  30 tablet  6  . ezetimibe (ZETIA) 10 MG tablet Take 1 tablet (10 mg total) by mouth daily.  90 tablet  3  . fluticasone (FLONASE) 50 MCG/ACT nasal spray Place 1 spray into both nostrils daily as needed for rhinitis or allergies.  16 g  2  . gabapentin (NEURONTIN) 400 MG capsule Take 1 capsule (400 mg total) by mouth daily.  90 capsule  3  . insulin aspart (NOVOLOG FLEXPEN) 100 UNIT/ML FlexPen Inject up to 85 units a day as advised  30 pen  3  . insulin glargine (LANTUS) 100 UNIT/ML injection Inject 0.6 mLs (60 Units total) into the skin at bedtime.  60 mL  3  . Insulin Syringes, Disposable, U-100 1 ML MISC For Lantus insulin  100 each  3  . isosorbide mononitrate (IMDUR) 60 MG 24 hr tablet Take 1 tablet (60 mg total) by mouth daily.  90 tablet  3  . levothyroxine (SYNTHROID, LEVOTHROID) 25 MCG tablet Take 1 tablet (25 mcg total) by mouth daily before breakfast.  30 tablet  5  . loratadine (CLARITIN) 10 MG tablet Take 1 tablet (10 mg total) by mouth daily.  30 tablet  11  . metFORMIN (GLUCOPHAGE) 1000 MG tablet Take 1 tablet (1,000 mg total) by mouth daily  with supper.  90 tablet  3  . metoprolol succinate (TOPROL-XL) 100 MG 24 hr tablet Take 1 tablet (100 mg total) by mouth daily. Take with or immediately following a meal.  90 tablet  3  . nitroGLYCERIN (NITROSTAT) 0.4 MG SL tablet Place 1 tablet (0.4 mg total) under the tongue every 5 (five) minutes as needed. For chest  30 tablet  6  . polyethylene glycol powder (GLYCOLAX/MIRALAX) powder Take 17 g by mouth 2 (two) times daily as needed.  3350 g  3  . traMADol (ULTRAM) 50 MG tablet Take 1 tablet (50 mg total) by mouth every 6 (six) hours as needed for moderate pain.  60 tablet  0  . traZODone (DESYREL) 50 MG tablet Take 0.5-1 tablets (25-50 mg total) by mouth at bedtime as needed for sleep.  60 tablet  3   No current facility-administered medications for this visit.    Allergies  Allergen Reactions  . Hydralazine Shortness Of Breath  . Penicillins Cross Reactors Hives    And high fever  . Adhesive [Tape] Rash    bruising    History   Social History  . Marital Status: Divorced    Spouse Name: N/A    Number of Children: N/A  . Years of Education: N/A   Occupational History  . Not on file.   Social History Main Topics  . Smoking status: Former Smoker -- 1.00 packs/day for 25 years    Types: Cigarettes    Quit date: 04/04/2000  . Smokeless tobacco: Never Used  . Alcohol Use: Yes     Comment: 08/04/2013  "have a glass of red wine on my birthday q yr; that's it"  . Drug Use: No  . Sexual Activity: No   Other Topics Concern  . Not on file   Social History Narrative  . No narrative on file     Review of Systems: General: negative for chills, fever, night sweats or weight changes.  Cardiovascular: negative for chest pain, dyspnea on exertion, edema, orthopnea, palpitations, paroxysmal nocturnal dyspnea or shortness of breath Dermatological: negative for rash Respiratory: negative for cough or wheezing Urologic: negative for hematuria Abdominal: negative for nausea,  vomiting, diarrhea, bright red blood per rectum, melena, or hematemesis Neurologic: negative for visual changes, syncope, or dizziness All other systems reviewed and are otherwise negative except as noted above.    Blood pressure 170/90, pulse 66, height 5\' 10"  (1.778 m), weight 243 lb (110.224 kg).  General appearance: alert, cooperative and no distress Neck: no carotid bruit and no JVD Lungs: clear to auscultation bilaterally Heart: regular rate and rhythm, S1, S2 normal, no murmur, click, rub or gallop Extremities: no [name] Pulses: 2+ and symmetric Skin: warm and dry Neurologic: Grossly normal  EKG normal sinus rhythm. Heart rate 66. No ischemic changes.  ASSESSMENT AND PLAN:   1. CAD: Recent PCI + DES to SVG-diagonal. She has continued to have occasional mild anginal like symptoms, immediately relieved with sublingual nitroglycerin. We will further increase her Imdur from 30 to 60 mg daily. Continue dual antiplatelet therapy with aspirin plus Plavix, beta blocker and statin.  2. Hypertension: Her blood pressure is elevated today at 170/90. She is asymptomatic. We will increase Imdur for both better blood pressure control and for greater antianginal effect. Start 60 mg daily.  3. Hyperlipidemia: Continue statin and Zetia.  4. Diabetes: Continue current plan of care per PCP.  PLAN:    We will continue current medical regimen as outlined above and will increase her Imdur to 60 mg daily for greater antianginal effect and for better blood pressure control. She has been instructed to followup with her primary cardiologist, Dr. Sallyanne Kuster, in 4-6 weeks for reassessment or sooner if she continues to have recurrent anginal symptoms.  SIMMONS, BRITTAINYPA-C 11/02/2013 6:11 PM

## 2013-11-08 ENCOUNTER — Emergency Department (HOSPITAL_COMMUNITY): Payer: Medicare Other

## 2013-11-08 ENCOUNTER — Observation Stay (HOSPITAL_COMMUNITY)
Admission: EM | Admit: 2013-11-08 | Discharge: 2013-11-09 | Disposition: A | Discharge status: Home / Self Care | Payer: Medicare Other | Attending: Internal Medicine | Admitting: Internal Medicine

## 2013-11-08 ENCOUNTER — Ambulatory Visit: Payer: Medicare Other

## 2013-11-08 ENCOUNTER — Encounter (HOSPITAL_COMMUNITY): Payer: Self-pay | Admitting: Emergency Medicine

## 2013-11-08 VITALS — BP 185/115 | HR 85 | Temp 98.5°F | Resp 16

## 2013-11-08 DIAGNOSIS — K219 Gastro-esophageal reflux disease without esophagitis: Secondary | ICD-10-CM | POA: Insufficient documentation

## 2013-11-08 DIAGNOSIS — I209 Angina pectoris, unspecified: Secondary | ICD-10-CM | POA: Diagnosis not present

## 2013-11-08 DIAGNOSIS — Z7902 Long term (current) use of antithrombotics/antiplatelets: Secondary | ICD-10-CM | POA: Diagnosis not present

## 2013-11-08 DIAGNOSIS — R079 Chest pain, unspecified: Secondary | ICD-10-CM | POA: Diagnosis present

## 2013-11-08 DIAGNOSIS — Z951 Presence of aortocoronary bypass graft: Secondary | ICD-10-CM | POA: Insufficient documentation

## 2013-11-08 DIAGNOSIS — G43909 Migraine, unspecified, not intractable, without status migrainosus: Secondary | ICD-10-CM | POA: Insufficient documentation

## 2013-11-08 DIAGNOSIS — E119 Type 2 diabetes mellitus without complications: Secondary | ICD-10-CM | POA: Diagnosis not present

## 2013-11-08 DIAGNOSIS — R071 Chest pain on breathing: Principal | ICD-10-CM | POA: Insufficient documentation

## 2013-11-08 DIAGNOSIS — Z79899 Other long term (current) drug therapy: Secondary | ICD-10-CM | POA: Insufficient documentation

## 2013-11-08 DIAGNOSIS — R61 Generalized hyperhidrosis: Secondary | ICD-10-CM | POA: Insufficient documentation

## 2013-11-08 DIAGNOSIS — Z9889 Other specified postprocedural states: Secondary | ICD-10-CM | POA: Diagnosis not present

## 2013-11-08 DIAGNOSIS — F329 Major depressive disorder, single episode, unspecified: Secondary | ICD-10-CM | POA: Diagnosis not present

## 2013-11-08 DIAGNOSIS — I509 Heart failure, unspecified: Secondary | ICD-10-CM | POA: Insufficient documentation

## 2013-11-08 DIAGNOSIS — J45909 Unspecified asthma, uncomplicated: Secondary | ICD-10-CM | POA: Insufficient documentation

## 2013-11-08 DIAGNOSIS — M1710 Unilateral primary osteoarthritis, unspecified knee: Secondary | ICD-10-CM | POA: Diagnosis not present

## 2013-11-08 DIAGNOSIS — Z8701 Personal history of pneumonia (recurrent): Secondary | ICD-10-CM | POA: Diagnosis not present

## 2013-11-08 DIAGNOSIS — I2510 Atherosclerotic heart disease of native coronary artery without angina pectoris: Secondary | ICD-10-CM | POA: Insufficient documentation

## 2013-11-08 DIAGNOSIS — F3289 Other specified depressive episodes: Secondary | ICD-10-CM | POA: Diagnosis not present

## 2013-11-08 DIAGNOSIS — E039 Hypothyroidism, unspecified: Secondary | ICD-10-CM | POA: Insufficient documentation

## 2013-11-08 DIAGNOSIS — N185 Chronic kidney disease, stage 5: Secondary | ICD-10-CM | POA: Diagnosis present

## 2013-11-08 DIAGNOSIS — E785 Hyperlipidemia, unspecified: Secondary | ICD-10-CM | POA: Diagnosis not present

## 2013-11-08 DIAGNOSIS — N182 Chronic kidney disease, stage 2 (mild): Secondary | ICD-10-CM | POA: Insufficient documentation

## 2013-11-08 DIAGNOSIS — I252 Old myocardial infarction: Secondary | ICD-10-CM | POA: Diagnosis not present

## 2013-11-08 DIAGNOSIS — IMO0002 Reserved for concepts with insufficient information to code with codable children: Secondary | ICD-10-CM | POA: Insufficient documentation

## 2013-11-08 DIAGNOSIS — Z9861 Coronary angioplasty status: Secondary | ICD-10-CM | POA: Diagnosis not present

## 2013-11-08 DIAGNOSIS — Z880 Allergy status to penicillin: Secondary | ICD-10-CM | POA: Diagnosis not present

## 2013-11-08 DIAGNOSIS — Z794 Long term (current) use of insulin: Secondary | ICD-10-CM | POA: Insufficient documentation

## 2013-11-08 DIAGNOSIS — D649 Anemia, unspecified: Secondary | ICD-10-CM | POA: Diagnosis not present

## 2013-11-08 DIAGNOSIS — I129 Hypertensive chronic kidney disease with stage 1 through stage 4 chronic kidney disease, or unspecified chronic kidney disease: Secondary | ICD-10-CM | POA: Diagnosis not present

## 2013-11-08 DIAGNOSIS — F411 Generalized anxiety disorder: Secondary | ICD-10-CM | POA: Insufficient documentation

## 2013-11-08 DIAGNOSIS — I739 Peripheral vascular disease, unspecified: Secondary | ICD-10-CM | POA: Insufficient documentation

## 2013-11-08 DIAGNOSIS — I10 Essential (primary) hypertension: Secondary | ICD-10-CM

## 2013-11-08 DIAGNOSIS — M109 Gout, unspecified: Secondary | ICD-10-CM | POA: Diagnosis not present

## 2013-11-08 DIAGNOSIS — Z7982 Long term (current) use of aspirin: Secondary | ICD-10-CM | POA: Insufficient documentation

## 2013-11-08 DIAGNOSIS — Z87891 Personal history of nicotine dependence: Secondary | ICD-10-CM | POA: Insufficient documentation

## 2013-11-08 DIAGNOSIS — I25810 Atherosclerosis of coronary artery bypass graft(s) without angina pectoris: Secondary | ICD-10-CM

## 2013-11-08 DIAGNOSIS — E1122 Type 2 diabetes mellitus with diabetic chronic kidney disease: Secondary | ICD-10-CM

## 2013-11-08 DIAGNOSIS — R570 Cardiogenic shock: Secondary | ICD-10-CM | POA: Diagnosis not present

## 2013-11-08 DIAGNOSIS — N189 Chronic kidney disease, unspecified: Secondary | ICD-10-CM

## 2013-11-08 DIAGNOSIS — N183 Chronic kidney disease, stage 3 unspecified: Secondary | ICD-10-CM

## 2013-11-08 DIAGNOSIS — R072 Precordial pain: Secondary | ICD-10-CM | POA: Diagnosis present

## 2013-11-08 HISTORY — DX: Atherosclerosis of coronary artery bypass graft(s) without angina pectoris: I25.810

## 2013-11-08 LAB — COMPREHENSIVE METABOLIC PANEL
ALBUMIN: 3.9 g/dL (ref 3.5–5.2)
ALT: 38 U/L — AB (ref 0–35)
AST: 23 U/L (ref 0–37)
Alkaline Phosphatase: 116 U/L (ref 39–117)
Anion gap: 13 (ref 5–15)
BUN: 24 mg/dL — ABNORMAL HIGH (ref 6–23)
CO2: 25 mEq/L (ref 19–32)
Calcium: 10.2 mg/dL (ref 8.4–10.5)
Chloride: 103 mEq/L (ref 96–112)
Creatinine, Ser: 1.35 mg/dL — ABNORMAL HIGH (ref 0.50–1.10)
GFR calc Af Amer: 49 mL/min — ABNORMAL LOW (ref >90)
GFR, EST NON AFRICAN AMERICAN: 42 mL/min — AB (ref >90)
Glucose, Bld: 182 mg/dL — ABNORMAL HIGH (ref 70–99)
Potassium: 4.1 mEq/L (ref 3.7–5.3)
Sodium: 141 mEq/L (ref 137–147)
Total Bilirubin: 0.5 mg/dL (ref 0.3–1.2)
Total Protein: 7.3 g/dL (ref 6.0–8.3)

## 2013-11-08 LAB — TROPONIN I: Troponin I: <0.3 ng/mL (ref <0.30)

## 2013-11-08 LAB — CBC
HEMATOCRIT: 39.7 % (ref 36.0–46.0)
HEMOGLOBIN: 13.3 g/dL (ref 12.0–15.0)
MCH: 30.4 pg (ref 26.0–34.0)
MCHC: 33.5 g/dL (ref 30.0–36.0)
MCV: 90.8 fL (ref 78.0–100.0)
Platelets: 233 10*3/uL (ref 150–400)
RBC: 4.37 MIL/uL (ref 3.87–5.11)
RDW: 13.5 % (ref 11.5–15.5)
WBC: 4.7 10*3/uL (ref 4.0–10.5)

## 2013-11-08 LAB — GLUCOSE, CAPILLARY: Glucose-Capillary: 155 mg/dL — ABNORMAL HIGH (ref 70–99)

## 2013-11-08 LAB — MAGNESIUM: MAGNESIUM: 1.7 mg/dL (ref 1.5–2.5)

## 2013-11-08 MED ORDER — AMLODIPINE BESYLATE 10 MG PO TABS
10.0000 mg | ORAL_TABLET | Freq: Every day | ORAL | Status: DC
  Administered 2013-11-08 – 2013-11-09 (×2): 10 mg via ORAL
  Filled 2013-11-08 (×2): qty 1

## 2013-11-08 MED ORDER — ISOSORBIDE MONONITRATE ER 60 MG PO TB24
60.0000 mg | ORAL_TABLET | Freq: Every day | ORAL | Status: DC
  Administered 2013-11-09: 60 mg via ORAL
  Filled 2013-11-08: qty 1

## 2013-11-08 MED ORDER — METOPROLOL SUCCINATE ER 100 MG PO TB24
100.0000 mg | ORAL_TABLET | Freq: Every day | ORAL | Status: DC
  Administered 2013-11-08 – 2013-11-09 (×2): 100 mg via ORAL
  Filled 2013-11-08 (×2): qty 1

## 2013-11-08 MED ORDER — LEVOTHYROXINE SODIUM 25 MCG PO TABS
25.0000 ug | ORAL_TABLET | Freq: Every day | ORAL | Status: DC
  Administered 2013-11-09: 25 ug via ORAL
  Filled 2013-11-08 (×2): qty 1

## 2013-11-08 MED ORDER — ENOXAPARIN SODIUM 40 MG/0.4ML ~~LOC~~ SOLN
40.0000 mg | SUBCUTANEOUS | Status: DC
  Administered 2013-11-08: 40 mg via SUBCUTANEOUS
  Filled 2013-11-08 (×2): qty 0.4

## 2013-11-08 MED ORDER — POLYETHYLENE GLYCOL 3350 17 G PO PACK
17.0000 g | PACK | Freq: Every day | ORAL | Status: DC | PRN
  Filled 2013-11-08: qty 1

## 2013-11-08 MED ORDER — INSULIN GLARGINE 100 UNIT/ML ~~LOC~~ SOLN
60.0000 [IU] | Freq: Every day | SUBCUTANEOUS | Status: DC
  Administered 2013-11-08: 60 [IU] via SUBCUTANEOUS
  Filled 2013-11-08 (×2): qty 0.6

## 2013-11-08 MED ORDER — MUSCLE RUB 10-15 % EX CREA
TOPICAL_CREAM | Freq: Two times a day (BID) | CUTANEOUS | Status: DC
  Administered 2013-11-08: 23:00:00 via TOPICAL
  Administered 2013-11-09: 1 via TOPICAL
  Filled 2013-11-08: qty 85

## 2013-11-08 MED ORDER — EZETIMIBE 10 MG PO TABS
10.0000 mg | ORAL_TABLET | Freq: Every day | ORAL | Status: DC
  Administered 2013-11-09: 10 mg via ORAL
  Filled 2013-11-08: qty 1

## 2013-11-08 MED ORDER — SODIUM CHLORIDE 0.9 % IV SOLN
20.0000 mL | INTRAVENOUS | Status: DC
  Administered 2013-11-08: 20 mL via INTRAVENOUS

## 2013-11-08 MED ORDER — ALPRAZOLAM 0.25 MG PO TABS: 0.2500 mg | ORAL_TABLET | Freq: Two times a day (BID) | ORAL | Status: DC | PRN

## 2013-11-08 MED ORDER — MENTHOL-CAMPHOR 11-11 % EX CREA: 1.0000 "application " | TOPICAL_CREAM | Freq: Two times a day (BID) | CUTANEOUS | Status: DC

## 2013-11-08 MED ORDER — CYCLOBENZAPRINE HCL 10 MG PO TABS
10.0000 mg | ORAL_TABLET | Freq: Three times a day (TID) | ORAL | Status: DC | PRN
  Administered 2013-11-08: 10 mg via ORAL
  Filled 2013-11-08: qty 1

## 2013-11-08 MED ORDER — ASPIRIN 81 MG PO CHEW
81.0000 mg | CHEWABLE_TABLET | Freq: Every day | ORAL | Status: DC
  Administered 2013-11-08 – 2013-11-09 (×2): 81 mg via ORAL
  Filled 2013-11-08 (×2): qty 1

## 2013-11-08 MED ORDER — GABAPENTIN 400 MG PO CAPS
400.0000 mg | ORAL_CAPSULE | Freq: Two times a day (BID) | ORAL | Status: DC
  Administered 2013-11-08 – 2013-11-09 (×2): 400 mg via ORAL
  Filled 2013-11-08 (×3): qty 1

## 2013-11-08 MED ORDER — ALBUTEROL SULFATE (2.5 MG/3ML) 0.083% IN NEBU: 2.5000 mg | INHALATION_SOLUTION | Freq: Four times a day (QID) | RESPIRATORY_TRACT | Status: DC | PRN

## 2013-11-08 MED ORDER — ACETAMINOPHEN 325 MG PO TABS: 650.0000 mg | ORAL_TABLET | ORAL | Status: DC | PRN

## 2013-11-08 MED ORDER — ATORVASTATIN CALCIUM 40 MG PO TABS
40.0000 mg | ORAL_TABLET | Freq: Every day | ORAL | Status: DC
  Administered 2013-11-08: 40 mg via ORAL
  Filled 2013-11-08 (×2): qty 1

## 2013-11-08 MED ORDER — IBUPROFEN 400 MG PO TABS
400.0000 mg | ORAL_TABLET | ORAL | Status: DC | PRN
  Filled 2013-11-08: qty 1

## 2013-11-08 MED ORDER — TRAMADOL HCL 50 MG PO TABS
50.0000 mg | ORAL_TABLET | Freq: Four times a day (QID) | ORAL | Status: DC | PRN
  Administered 2013-11-08: 50 mg via ORAL
  Filled 2013-11-08: qty 1

## 2013-11-08 MED ORDER — TRAZODONE 25 MG HALF TABLET
25.0000 mg | ORAL_TABLET | Freq: Every day | ORAL | Status: DC
  Administered 2013-11-08: 25 mg via ORAL
  Filled 2013-11-08 (×2): qty 1

## 2013-11-08 MED ORDER — SODIUM CHLORIDE 0.9 % IV SOLN: 250.0000 mL | INTRAVENOUS | Status: DC | PRN

## 2013-11-08 MED ORDER — CLOPIDOGREL BISULFATE 75 MG PO TABS
75.0000 mg | ORAL_TABLET | Freq: Every day | ORAL | Status: DC
  Administered 2013-11-08 – 2013-11-09 (×2): 75 mg via ORAL
  Filled 2013-11-08 (×2): qty 1

## 2013-11-08 MED ORDER — CLONIDINE HCL 0.2 MG PO TABS
0.2000 mg | ORAL_TABLET | Freq: Two times a day (BID) | ORAL | Status: DC
  Administered 2013-11-08 – 2013-11-09 (×2): 0.2 mg via ORAL
  Filled 2013-11-08 (×3): qty 1

## 2013-11-08 MED ORDER — KETOROLAC TROMETHAMINE 30 MG/ML IJ SOLN
30.0000 mg | Freq: Once | INTRAMUSCULAR | Status: AC
  Administered 2013-11-08: 30 mg via INTRAVENOUS
  Filled 2013-11-08: qty 1

## 2013-11-08 MED ORDER — FLUTICASONE PROPIONATE 50 MCG/ACT NA SUSP
2.0000 | Freq: Every day | NASAL | Status: DC | PRN
  Filled 2013-11-08: qty 16

## 2013-11-08 MED ORDER — ONDANSETRON HCL 4 MG/2ML IJ SOLN: 4.0000 mg | Freq: Four times a day (QID) | INTRAMUSCULAR | Status: DC | PRN

## 2013-11-08 MED ORDER — NITROGLYCERIN 0.4 MG SL SUBL: 0.4000 mg | SUBLINGUAL_TABLET | SUBLINGUAL | Status: DC | PRN

## 2013-11-08 MED ORDER — SODIUM CHLORIDE 0.9 % IJ SOLN: 3.0000 mL | INTRAMUSCULAR | Status: DC | PRN

## 2013-11-08 MED ORDER — TRAMADOL HCL 50 MG PO TABS
50.0000 mg | ORAL_TABLET | Freq: Once | ORAL | Status: AC
  Administered 2013-11-08: 50 mg via ORAL
  Filled 2013-11-08: qty 1

## 2013-11-08 MED ORDER — ALBUTEROL SULFATE HFA 108 (90 BASE) MCG/ACT IN AERS: 2.0000 | INHALATION_SPRAY | Freq: Four times a day (QID) | RESPIRATORY_TRACT | Status: DC | PRN

## 2013-11-08 MED ORDER — ALLOPURINOL 300 MG PO TABS
300.0000 mg | ORAL_TABLET | Freq: Every day | ORAL | Status: DC
  Administered 2013-11-09: 300 mg via ORAL
  Filled 2013-11-08: qty 1

## 2013-11-08 MED ORDER — CLONIDINE HCL 0.1 MG PO TABS
0.2000 mg | ORAL_TABLET | Freq: Once | ORAL | Status: AC
Start: 2013-11-08 — End: 2013-11-08
  Administered 2013-11-08: 0.2 mg via ORAL

## 2013-11-08 MED ORDER — INSULIN ASPART 100 UNIT/ML ~~LOC~~ SOLN
20.0000 [IU] | Freq: Three times a day (TID) | SUBCUTANEOUS | Status: DC
  Administered 2013-11-09 (×2): 20 [IU] via SUBCUTANEOUS

## 2013-11-08 MED ORDER — SODIUM CHLORIDE 0.9 % IJ SOLN
3.0000 mL | Freq: Two times a day (BID) | INTRAMUSCULAR | Status: DC
  Administered 2013-11-08 – 2013-11-09 (×2): 3 mL via INTRAVENOUS

## 2013-11-08 MED ORDER — ZOLPIDEM TARTRATE 5 MG PO TABS: 5.0000 mg | ORAL_TABLET | Freq: Every evening | ORAL | Status: DC | PRN

## 2013-11-08 MED ORDER — LORATADINE 10 MG PO TABS
10.0000 mg | ORAL_TABLET | Freq: Every day | ORAL | Status: DC
  Administered 2013-11-09: 10 mg via ORAL
  Filled 2013-11-08: qty 1

## 2013-11-08 NOTE — ED Notes (Signed)
Updated  Junie Spencer, states is ready to receive patient at this time.

## 2013-11-08 NOTE — ED Notes (Signed)
Patient transported to X-ray 

## 2013-11-08 NOTE — H&P (Signed)
History and Physical   Patient ID: Brooke Stafford MRN: [id number], DOB/AGE: Jul 20, 1955 57 y.o. Date of Encounter: 11/08/2013  Primary Physician: Angelica Chessman, MD Primary Cardiologist: Dr. Sallyanne Kuster, Dr. Gwenlyn Found  Chief Complaint: Chest pain   HPI: Brooke Stafford is a 57 y.o. female with a history of CAD, DM, CKD, HTN, HLD, PVD, obesity, CABG x 6 2002, NSTEMI 2011 (demand ischemia, med rx at cath). She had SOB, abnl nuc --> cath 06/16 w/ stent SVG-Diag via left radial. Seen in the office 07/21 and says was complaining of left wrist, arm and upper chest/shoulder pain, Rx with Tylenol and f/u with primary MD. Note mentions possible angina symptoms (resolved w/ SL NTG) were rx w/ increased Imdur.   Pt has had left wrist pain with decreased functioning in left hand, soft knot in left AC area, pain up her arm into her shoulder, upper back pain and upper left chest pain since the procedure in June. Her left hand does not function as well and the pain seems to make it difficult to bend her fingers at times. She has sensation. She is not currently complaining of lower chest pain, but has had some episodes of sharp pain.   She went to the walk-in clinic with this today and was sent to the ER. Also, her BP has been very high, and today was almost [id number] systolic.  Past Medical History  Diagnosis Date  . Asthma   . Anxiety   . GERD (gastroesophageal reflux disease)   . Coronary artery disease 2002    CABG x 6. Cath 5/11- med Rx  . Hypertension   . Peripheral vascular disease 12/12    LSFA PTA  . Anemia   . Hyperlipidemia   . Chronic renal insufficiency, stage II (mild)     followed  by [facility]  . CAD (coronary artery disease) 2002    CABG x 6 2002, cath 2011- med Rx  . Hypothyroid     treated  . Obesity (BMI 35.0-39.9 without comorbidity)   . CHF (congestive heart failure)     "in 2002" (11/26/2012)  . Myocardial infarction 2000; 2002; 2011?  [name] Chronic bronchitis     "q  year; in the winter" (08/04/2013)  . Type II diabetes mellitus   . Depression   . Pneumonia     "3 times I think" (08/04/2013)  . History of blood transfusion 2002    "when I had OHS"  . Migraines     "couple times/year" (08/04/2013)  . Headache     "~ q week" (08/04/2013)  . Arthritis     "stiff fingers and knees" (08/04/2013)  . Gout     "right big toe"  . Anginal pain     Surgical History:  Past Surgical History  Procedure Laterality Date  . Cholecystectomy  1982  . Cesarean section  1978; 1980  . Tubal ligation  1980  . Breast cyst excision Right 1970's  . Coronary artery bypass graft  11/20/2000    x6 LIMA to distal LAD, svg to first diag, svg to ramus intermediate branch and swquential SVG to cir marginal branch, SVG to posterior descending coronary and sequential SVG to first right posterolateral branch  . Cardiac catheterization  2002  . Coronary angioplasty with stent placement  2004; 2012    "I have 2 stents" (08/04/2013)  . Peripheral arterial stent graft Left     SFA/notes 04/07/2011 (11/30/2012)  . Nm myocar perf wall motion  08/27/2004    negative  . Appendectomy  1980     I have reviewed the patient's current medications. Medication Sig  albuterol (PROVENTIL HFA;VENTOLIN HFA) 108 (90 BASE) MCG/ACT inhaler Inhale 2 puffs into the lungs every 6 (six) hours as needed. For shortness of breath  allopurinol (ZYLOPRIM) 300 MG tablet Take 300 mg by mouth daily.   amLODipine (NORVASC) 10 MG tablet Take 1 tablet (10 mg total) by mouth daily.  aspirin 81 MG chewable tablet Chew 1 tablet (81 mg total) by mouth daily.  Aspirin-Salicylamide-Caffeine (BC HEADACHE POWDER PO) Take 1 Package by mouth daily as needed (for pain).  atorvastatin (LIPITOR) 40 MG tablet Take 1 tablet (40 mg total) by mouth daily.  cloNIDine (CATAPRES) 0.2 MG tablet Take 0.2 mg by mouth 2 (two) times daily.  clopidogrel (PLAVIX) 75 MG tablet Take 1 tablet (75 mg total) by mouth daily.  ezetimibe (ZETIA) 10  MG tablet Take 1 tablet (10 mg total) by mouth daily.  fluticasone (FLONASE) 50 MCG/ACT nasal spray Place 2 sprays into both nostrils daily as needed for allergies or rhinitis.  gabapentin (NEURONTIN) 400 MG capsule Take 400 mg by mouth 2 (two) times daily.  insulin aspart (NOVOLOG FLEXPEN) 100 UNIT/ML FlexPen Inject 20-35 Units into the skin 3 (three) times daily with meals. Inject 30 units subq at breakfast, inject 20 units subq at lunch and inject 35 units subq at supper.  insulin glargine (LANTUS) 100 UNIT/ML injection Inject 0.6 mLs (60 Units total) into the skin at bedtime.  Insulin Syringes, Disposable, (B-D INSULIN SYRINGE 1CC) U-100 1 ML MISC 1 each by Does not apply route See admin instructions. Use with insulin daily.  isosorbide mononitrate (IMDUR) 60 MG 24 hr tablet Take 1 tablet (60 mg total) by mouth daily.  levothyroxine (SYNTHROID, LEVOTHROID) 25 MCG tablet Take 1 tablet (25 mcg total) by mouth daily before breakfast.  loratadine (CLARITIN) 10 MG tablet Take 1 tablet (10 mg total) by mouth daily.  Menthol-Camphor (TIGER BALM ARTHRITIS RUB) 11-11 % CREA Apply 1 application topically 2 (two) times daily.  metFORMIN (GLUCOPHAGE) 1000 MG tablet Take 1 tablet (1,000 mg total) by mouth daily with supper.  metoprolol succinate (TOPROL-XL) 100 MG 24 hr tablet Take 1 tablet (100 mg total) by mouth daily. Take with or immediately following a meal.  nitroGLYCERIN (NITROSTAT) 0.4 MG SL tablet Place 1 tablet (0.4 mg total) under the tongue every 5 (five) minutes as needed. For chest  polyethylene glycol (MIRALAX / GLYCOLAX) packet Take 17 g by mouth daily as needed for moderate constipation.  traMADol (ULTRAM) 50 MG tablet Take 1 tablet (50 mg total) by mouth every 6 (six) hours as needed for moderate pain.  traZODone (DESYREL) 50 MG tablet Take 25 mg by mouth at bedtime.   Scheduled Meds:  Continuous Infusions: . sodium chloride 20 mL (11/08/13 1713)   PRN Meds:.  Allergies:  Allergies    Allergen Reactions  . Hydralazine Shortness Of Breath  . Penicillins Cross Reactors Hives    And high fever  . Adhesive [Tape] Rash    bruising    History   Social History  . Marital Status: Divorced    Spouse Name: N/A    Number of Children: N/A  . Years of Education: N/A   Occupational History  . Not on file.   Social History Main Topics  . Smoking status: Former Smoker -- 1.00 packs/day for 25 years    Types: Cigarettes    Quit date: 04/04/2000  .  Smokeless tobacco: Never Used  . Alcohol Use: Yes     Comment: 08/04/2013  "have a glass of red wine on my birthday q yr; that's it"  . Drug Use: No  . Sexual Activity: No   Other Topics Concern  . Not on file   Social History Narrative  . No narrative on file    Family History  Problem Relation Age of Onset  . Diabetes Mother   . Hypertension Mother   . Stroke Mother   . Hypertension Father   . Hypertension Brother   . Hypertension Sister   . Diabetes Sister   . Hyperlipidemia Sister    Family Status  Relation Status Death Age  . Mother Deceased   . Father Deceased   . Sister Alive   . Brother Alive   . Maternal Grandmother Deceased   . Maternal Grandfather Deceased   . Paternal Grandmother Deceased   . Paternal Grandfather Deceased     Review of Systems:   Full 14-point review of systems otherwise negative except as noted above.  Physical Exam: Blood pressure 149/85, pulse 78, temperature 98.7 F (37.1 C), temperature source Oral, resp. rate 20, height 5' 10.5" (1.791 m), weight 237 lb (107.502 kg), SpO2 100.00%. General: Well developed, well nourished,female in no acute distress. Head: Normocephalic, atraumatic, sclera non-icteric, no xanthomas, nares are without discharge. Dentition:  Neck: No carotid bruits. JVD not elevated. No thyromegally Lungs: Good expansion bilaterally. without wheezes or rhonchi.  Heart: IRRegular rate and rhythm with S1 S2.  No S3 or S4.  No murmur, no rubs, or gallops  appreciated. Abdomen: Soft, non-tender, non-distended with normoactive bowel sounds. No hepatomegaly. No rebound/guarding. No obvious abdominal masses. Msk:  Strength and tone appear normal for age. No joint deformities or effusions, no spine or costo-vertebral angle tenderness. Extremities: No clubbing or cyanosis. No edema.  Distal pedal pulses are 2+ in 4 extrem Neuro: Alert and oriented X 3. Moves all extremities spontaneously. No focal deficits noted. Psych:  Responds to questions appropriately with a normal affect. Skin: No rashes or lesions noted  Labs:   Lab Results  Component Value Date   WBC 4.7 11/08/2013   HGB 13.3 11/08/2013   HCT 39.7 11/08/2013   MCV 90.8 11/08/2013   PLT 233 11/08/2013     Recent Labs Lab 11/08/13 1618  NA 141  K 4.1  CL 103  CO2 25  BUN 24*  CREATININE 1.35*  CALCIUM 10.2  PROT 7.3  BILITOT 0.5  ALKPHOS 116  ALT 38*  AST 23  GLUCOSE 182*    Recent Labs  11/08/13 1618  TROPONINI <0.30    Radiology/Studies: Dg Chest 2 View 11/08/2013   CLINICAL DATA:  Chest pain.  EXAM: CHEST  2 VIEW  COMPARISON:  Single view of the chest 08/03/2013 and 0 01/05/2011.  FINDINGS: The patient is status post CABG. Heart size is upper normal. Lungs are clear. No pneumothorax or pleural effusion.  IMPRESSION: No acute disease.   Electronically Signed   By: Inge Rise M.D.   On: 11/08/2013 16:46     Cardiac Cath: 09/28/2013 Left mainstem: The left main is calcified with 50% distal left main stenosis.  Left anterior descending (LAD): The LAD is diffusely diseased in the proximal vessel up to 80%. There is an 80-90% stenosis in the mid vessel. The LAD fills by LIMA.  The ramus intermediate artery has an 80-90% stenosis proximally.  Left circumflex (LCx): The LCx gives rise to a single  OM and is without significant disease.  Right coronary artery (RCA): The RCA has a long segment of 70% disease in the mid vessel. There is a long segmental 95% stenosis in the  distal RCA.  The SVG to the PDA is widely patent and supplies the PLOM. There is mild disease in the mid vein graft to 30%.  The SVG to the intermediate is chronically occluded.  The SVG to the diagonal has a 90% stenosis in the proximal body of the graft.  The LIMA to the LAD is widely patent.  PCI Data:  Vessel - SVG to the diagonal/Segment - proximal  Percent Stenosis (pre) 90%  TIMI-flow 3  Stent 3.5 x 23 mm Xience Alpine  Percent Stenosis (post) 0%  TIMI-flow (post) 3  Final Conclusions:  1. Severe 3 vessel obstructive CAD  2. Patent LIMA to the LAD.  3. Patent SVG to PDA  4. Patent but severely stenosed SVG to the diagonal.  5. Chronic occlusion of the SVG to the intermediate.  6. Successful stenting of the SVG to the diagonal with a DES.  Recommendations:  Continue dual antiplatelet therapy for at least one year. If the patient has refractory angina could consider PCI of the ramus intermediate branch. We did not address this lesion since I did not think this was the culprit and because of limiting contrast load with her CKD.  Echo: 09/02/2013 Study Conclusions - Left ventricle: The cavity size was normal. Wall thickness was normal. Systolic function was normal. The estimated ejection fraction was in the range of 55% to 60%. Wall motion was normal; there were no regional wall motion abnormalities. - Aortic valve: Valve area (VTI): 2.2 cm^2. Valve area (Vmax): 2.14 cm^2. - Left atrium: The atrium was mildly dilated. - Right ventricle: The cavity size was mildly dilated.   ECG: 11/08/2013 SR, no new ischemic changes Vent. rate 84 BPM PR interval 172 ms QRS duration 94 ms QT/QTc 393/465 ms P-R-T axes 44 50 66  ASSESSMENT AND PLAN:  Principal Problem:   Precordial pain -  continue ASA, Plavix, BB, statin. Atypical pain, so no change in meds for now.  Active Problems:   Diabetes mellitus with chronic kidney disease - home rx minus glucophage plus SSI    Dyslipidemia  - continue Rx    Essential hypertension, benign - on amlodipine 10 mg, catapres 0.2 mg BID, Imdur 60 mg. Did not tolerate hydralazine in the past, not on beta blockers, hx asthma but only uses PRN albuterol. MD advise on trying low-dose metoprolol. Could also change her CCB to Procardia.    Back pain - NSAIDS and muscle relaxers.  Jonetta Speak, PA-C 11/08/2013 7:00 PM Beeper [phone]

## 2013-11-08 NOTE — ED Notes (Signed)
Spoke with Dr. Vanita Panda, patient's pain is not cardiac related and all cardiac workup has been negative.  Telemetry floor is appropriate.  Rapid response called to assess.

## 2013-11-08 NOTE — H&P (Signed)
Pt. Seen and examined. Agree with the NP/PA-C note as written. 58 yo female with known CAD. Recent dyspnea and abnormal NST. Cath showed vein graft occlusion - after PCI she felt better. She has had progressive left shoulder, arm, neck and hand pain. She had a left radial cath approach. Radial pulse is intact, good perfusion to hand. She is reporting cramps. She is also having back spams. Exam notable for small, spongy area of tissue over the left medial epicondyle (no bruit or ecchymosis).   Pain is more consistent in my opinion with cervical radiculopathy or neuropathic pain. She has had uncontrolled hypertension as well. Would admit to r/o ACS. Check cervical x-rays. Check left arm arterial dopplers to r/o pseudoaneurysm or trauma from recent radial cath (she has known PAD). Treat with NSAIDS and muscle relaxants. Continue current coronary medications.   Pixie Casino, MD, [facility] Attending Cardiologist [place]

## 2013-11-08 NOTE — ED Notes (Signed)
Attempted to call report

## 2013-11-08 NOTE — ED Notes (Addendum)
April Rapid Response RN contacted to review appropriateness of patient for telemetry bed per request of 3W RN. RRRN to see patient.

## 2013-11-08 NOTE — ED Notes (Signed)
Admitting physician at the bedside.

## 2013-11-08 NOTE — ED Provider Notes (Signed)
CSN: [id number]     Arrival date & time 11/08/13  1535 History   First MD Initiated Contact with Patient 11/08/13 1536     Chief Complaint  Patient presents with  . Chest Pain      HPI  Patient presents with concern of ongoing chest and left arm pain. Patient had coronary stents placed 6 weeks ago with access obtained via left radial artery. She notes that since that time she's had soreness in the left arm, left upper chest.  Pain is moderate, sore, nonradiating, with no no associated dyspnea, fever, chills, nausea, vomiting. There is mild associated diaphoresis. Patient had pain relief with tramadol previously. No pleuritic or exertional change. Patient was at urgent care today, sent here for evaluation.   Past Medical History  Diagnosis Date  . Asthma   . Anxiety   . GERD (gastroesophageal reflux disease)   . Coronary artery disease 2002    CABG x 6. Cath 5/11- med Rx  . Hypertension   . Peripheral vascular disease 12/12    LSFA PTA  . Anemia   . Hyperlipidemia   . Chronic renal insufficiency, stage II (mild)     followed  by [facility]  . CAD (coronary artery disease) 2002    CABG x 6 2002, cath 2011- med Rx  . Hypothyroid     treated  . Obesity (BMI 35.0-39.9 without comorbidity)   . CHF (congestive heart failure)     "in 2002" (11/26/2012)  . Myocardial infarction 2000; 2002; 2011?  [name] Chronic bronchitis     "q year; in the winter" (08/04/2013)  . Type II diabetes mellitus   . Depression   . Pneumonia     "3 times I think" (08/04/2013)  . History of blood transfusion 2002    "when I had OHS"  . Migraines     "couple times/year" (08/04/2013)  . Headache     "~ q week" (08/04/2013)  . Arthritis     "stiff fingers and knees" (08/04/2013)  . Gout     "right big toe"  . Anginal pain    Past Surgical History  Procedure Laterality Date  . Cholecystectomy  1982  . Cesarean section  1978; 1980  . Tubal ligation  1980  . Breast cyst excision Right 1970's  .  Coronary artery bypass graft  11/20/2000    x6 LIMA to distal LAD, svg to first diag, svg to ramus intermediate branch and swquential SVG to cir marginal branch, SVG to posterior descending coronary and sequential SVG to first right posterolateral branch  . Cardiac catheterization  2002  . Coronary angioplasty with stent placement  2004; 2012    "I have 2 stents" (08/04/2013)  . Peripheral arterial stent graft Left     SFA/notes 04/07/2011 (11/30/2012)  . Nm myocar perf wall motion  08/27/2004    negative  . Appendectomy  1980   Family History  Problem Relation Age of Onset  . Diabetes Mother   . Hypertension Mother   . Stroke Mother   . Hypertension Father   . Hypertension Brother   . Hypertension Sister   . Diabetes Sister   . Hyperlipidemia Sister    History  Substance Use Topics  . Smoking status: Former Smoker -- 1.00 packs/day for 25 years    Types: Cigarettes    Quit date: 04/04/2000  . Smokeless tobacco: Never Used  . Alcohol Use: Yes     Comment: 08/04/2013  "have a glass of red  wine on my birthday q yr; that's it"   OB History   Grav Para Term Preterm Abortions TAB SAB Ect Mult Living                 Review of Systems  Constitutional:       Per HPI, otherwise negative  HENT:       Per HPI, otherwise negative  Respiratory:       Per HPI, otherwise negative  Cardiovascular:       Per HPI, otherwise negative  Gastrointestinal: Negative for vomiting.  Endocrine:       Negative aside from HPI  Genitourinary:       Neg aside from HPI   Musculoskeletal:       Per HPI, otherwise negative  Skin: Negative.   Neurological: Negative for syncope.      Allergies  Hydralazine; Penicillins cross reactors; and Adhesive  Home Medications   Prior to Admission medications   Medication Sig Start Date End Date Taking? Authorizing Provider  albuterol (PROVENTIL HFA;VENTOLIN HFA) 108 (90 BASE) MCG/ACT inhaler Inhale 2 puffs into the lungs every 6 (six) hours as needed.  For shortness of breath 06/02/13   Reyne Dumas, MD  allopurinol (ZYLOPRIM) 300 MG tablet Take 150 mg by mouth daily.    Historical Provider, MD  amLODipine (NORVASC) 10 MG tablet Take 1 tablet (10 mg total) by mouth daily. 06/02/13   Reyne Dumas, MD  aspirin 81 MG chewable tablet Chew 1 tablet (81 mg total) by mouth daily. 09/29/13   Brittainy Simmons, PA-C  atorvastatin (LIPITOR) 40 MG tablet Take 1 tablet (40 mg total) by mouth daily. 06/02/13   Reyne Dumas, MD  cloNIDine (CATAPRES) 0.2 MG tablet Take 1 tablet (0.2 mg total) by mouth 3 (three) times daily. 08/10/13   Lorayne Marek, MD  clopidogrel (PLAVIX) 75 MG tablet Take 1 tablet (75 mg total) by mouth daily. 06/02/13   Reyne Dumas, MD  ezetimibe (ZETIA) 10 MG tablet Take 1 tablet (10 mg total) by mouth daily. 08/30/13   Brittainy Simmons, PA-C  fluticasone (FLONASE) 50 MCG/ACT nasal spray Place 1 spray into both nostrils daily as needed for rhinitis or allergies. 06/02/13   Reyne Dumas, MD  gabapentin (NEURONTIN) 400 MG capsule Take 1 capsule (400 mg total) by mouth daily. 06/02/13   Reyne Dumas, MD  insulin aspart (NOVOLOG FLEXPEN) 100 UNIT/ML FlexPen Inject up to 85 units a day as advised 10/21/13   Philemon Kingdom, MD  insulin glargine (LANTUS) 100 UNIT/ML injection Inject 0.6 mLs (60 Units total) into the skin at bedtime. 10/21/13   Philemon Kingdom, MD  Insulin Syringes, Disposable, U-100 1 ML MISC For Lantus insulin 08/04/13   Ripudeep Krystal Eaton, MD  isosorbide mononitrate (IMDUR) 60 MG 24 hr tablet Take 1 tablet (60 mg total) by mouth daily. 11/02/13   Brittainy Simmons, PA-C  levothyroxine (SYNTHROID, LEVOTHROID) 25 MCG tablet Take 1 tablet (25 mcg total) by mouth daily before breakfast. 09/21/13   Lorayne Marek, MD  loratadine (CLARITIN) 10 MG tablet Take 1 tablet (10 mg total) by mouth daily. 09/21/13   Lorayne Marek, MD  metFORMIN (GLUCOPHAGE) 1000 MG tablet Take 1 tablet (1,000 mg total) by mouth daily with supper. 10/21/13   Philemon Kingdom, MD   metoprolol succinate (TOPROL-XL) 100 MG 24 hr tablet Take 1 tablet (100 mg total) by mouth daily. Take with or immediately following a meal. 09/24/13   Brittainy Simmons, PA-C  nitroGLYCERIN (NITROSTAT) 0.4 MG SL tablet Place 1  tablet (0.4 mg total) under the tongue every 5 (five) minutes as needed. For chest 08/30/13   Brittainy Simmons, PA-C  polyethylene glycol powder (GLYCOLAX/MIRALAX) powder Take 17 g by mouth 2 (two) times daily as needed. 06/02/13   Reyne Dumas, MD  traMADol (ULTRAM) 50 MG tablet Take 1 tablet (50 mg total) by mouth every 6 (six) hours as needed for moderate pain. 08/04/13   Ripudeep Krystal Eaton, MD  traZODone (DESYREL) 50 MG tablet Take 0.5-1 tablets (25-50 mg total) by mouth at bedtime as needed for sleep. 06/21/13   Reyne Dumas, MD   Pulse 82  Resp 14  Ht 5' 10.5" (1.791 m)  Wt 237 lb (107.502 kg)  BMI 33.51 kg/m2  SpO2 99% Physical Exam  Nursing note and vitals reviewed. Constitutional: She is oriented to person, place, and time. She appears well-developed and well-nourished. No distress.  HENT:  Head: Normocephalic and atraumatic.  Eyes: Conjunctivae and EOM are normal.  Cardiovascular: Normal rate, regular rhythm and intact distal pulses.   Pulses are appreciable, symmetric, normal in the upper extremities  Pulmonary/Chest: Effort normal and breath sounds normal. No stridor. No respiratory distress.  Mild tenderness to palpation about the left superior chest wall, no deformity  Abdominal: She exhibits no distension.  Musculoskeletal: She exhibits no edema.  No deformity, no asymmetric edema of the upper extremities  Neurological: She is alert and oriented to person, place, and time. No cranial nerve deficit.  Skin: Skin is warm and dry.  Psychiatric: She has a normal mood and affect.    ED Course  Procedures (including critical care time) Labs Review Labs Reviewed  COMPREHENSIVE METABOLIC PANEL - Abnormal; Notable for the following:    Glucose, Bld 182 (*)     BUN 24 (*)    Creatinine, Ser 1.35 (*)    ALT 38 (*)    GFR calc non Af Amer 42 (*)    GFR calc Af Amer 49 (*)    All other components within normal limits  CBC  MAGNESIUM  TROPONIN I    Imaging Review Dg Chest 2 View  11/08/2013   CLINICAL DATA:  Chest pain.  EXAM: CHEST  2 VIEW  COMPARISON:  Single view of the chest 08/03/2013 and 0 01/05/2011.  FINDINGS: The patient is status post CABG. Heart size is upper normal. Lungs are clear. No pneumothorax or pleural effusion.  IMPRESSION: No acute disease.   Electronically Signed   By: Inge Rise M.D.   On: 11/08/2013 16:46     EKG Interpretation   Date/Time:  Monday November 08 2013 15:47:54 EDT Ventricular Rate:  84 PR Interval:  172 QRS Duration: 94 QT Interval:  393 QTC Calculation: 465 R Axis:   50 Text Interpretation:  Sinus rhythm Probable left atrial enlargement ST  elevation, consider inferior injury Sinus rhythm Left atrial enlargement  ST-t wave abnormality No significant change since last tracing Abnormal  ekg Confirmed by Carmin Muskrat  MD [phone]) on 11/08/2013 3:53:34 PM     I reviewed the patient's chart which is most notable for demonstration of recent catheterization, with diagnosis of present disease beyond the lesions that were stented MDM  Patient with chest pain presents several weeks after catheterization, and given her ongoing pain, or admission for further evaluation and management, though her initial labs were reassuring, she was hemodynamically stable in the emergency department.  Carmin Muskrat, MD 11/08/13 [phone]

## 2013-11-08 NOTE — ED Notes (Signed)
April, RRRN at bedside.

## 2013-11-08 NOTE — ED Notes (Signed)
[facility]o chest wall pain and left arm pain sin stent placement in June. Pt denies N/V, SOB. Pt reporting diaphoresis. NAD noted.

## 2013-11-08 NOTE — Progress Notes (Unsigned)
Patient presents to walk in clinic with c/o left side shoulder and arm pain. Patient also c/o hand cramping, and back spasms since having cardiac stent placed 09/28/2013. Left hand is swollen and warm elevated area in left elbow area. Patient also has some tenderness in chest area. Patient evaluated by Myrtis Hopping, NP student. Decision made to send patient to ED. Report called to [facility], Therapist, sports. EMS called for non emergent transport. Vivia Birmingham, RN

## 2013-11-08 NOTE — ED Notes (Signed)
Brooke Stafford states pt is appropriate for telemetry unit.

## 2013-11-09 ENCOUNTER — Telehealth: Payer: Self-pay | Admitting: Interventional Cardiology

## 2013-11-09 ENCOUNTER — Observation Stay (HOSPITAL_COMMUNITY): Payer: Medicare Other

## 2013-11-09 ENCOUNTER — Encounter (HOSPITAL_COMMUNITY): Payer: Self-pay | Admitting: Cardiology

## 2013-11-09 DIAGNOSIS — I25810 Atherosclerosis of coronary artery bypass graft(s) without angina pectoris: Secondary | ICD-10-CM

## 2013-11-09 DIAGNOSIS — N183 Chronic kidney disease, stage 3 unspecified: Secondary | ICD-10-CM | POA: Diagnosis not present

## 2013-11-09 DIAGNOSIS — N189 Chronic kidney disease, unspecified: Secondary | ICD-10-CM

## 2013-11-09 DIAGNOSIS — R071 Chest pain on breathing: Secondary | ICD-10-CM | POA: Diagnosis not present

## 2013-11-09 DIAGNOSIS — R072 Precordial pain: Secondary | ICD-10-CM | POA: Diagnosis not present

## 2013-11-09 DIAGNOSIS — E1129 Type 2 diabetes mellitus with other diabetic kidney complication: Secondary | ICD-10-CM | POA: Diagnosis not present

## 2013-11-09 DIAGNOSIS — M79609 Pain in unspecified limb: Secondary | ICD-10-CM

## 2013-11-09 DIAGNOSIS — M5090 Cervical disc disorder, unspecified, unspecified cervical region: Secondary | ICD-10-CM | POA: Diagnosis not present

## 2013-11-09 HISTORY — DX: Atherosclerosis of coronary artery bypass graft(s) without angina pectoris: I25.810

## 2013-11-09 LAB — BASIC METABOLIC PANEL
ANION GAP: 13 (ref 5–15)
BUN: 38 mg/dL — ABNORMAL HIGH (ref 6–23)
CO2: 24 meq/L (ref 19–32)
Calcium: 9.6 mg/dL (ref 8.4–10.5)
Chloride: 99 mEq/L (ref 96–112)
Creatinine, Ser: 2.03 mg/dL — ABNORMAL HIGH (ref 0.50–1.10)
GFR calc Af Amer: 30 mL/min — ABNORMAL LOW (ref >90)
GFR calc non Af Amer: 26 mL/min — ABNORMAL LOW (ref >90)
GLUCOSE: 176 mg/dL — AB (ref 70–99)
POTASSIUM: 4.2 meq/L (ref 3.7–5.3)
Sodium: 136 mEq/L — ABNORMAL LOW (ref 137–147)

## 2013-11-09 LAB — COMPREHENSIVE METABOLIC PANEL
ALT: 34 U/L (ref 0–35)
AST: 22 U/L (ref 0–37)
Albumin: 3.3 g/dL — ABNORMAL LOW (ref 3.5–5.2)
Alkaline Phosphatase: 98 U/L (ref 39–117)
Anion gap: 11 (ref 5–15)
BUN: 32 mg/dL — ABNORMAL HIGH (ref 6–23)
CALCIUM: 9.2 mg/dL (ref 8.4–10.5)
CHLORIDE: 102 meq/L (ref 96–112)
CO2: 24 mEq/L (ref 19–32)
Creatinine, Ser: 1.81 mg/dL — ABNORMAL HIGH (ref 0.50–1.10)
GFR, EST AFRICAN AMERICAN: 34 mL/min — AB (ref >90)
GFR, EST NON AFRICAN AMERICAN: 29 mL/min — AB (ref >90)
GLUCOSE: 216 mg/dL — AB (ref 70–99)
Potassium: 4.3 mEq/L (ref 3.7–5.3)
Sodium: 137 mEq/L (ref 137–147)
Total Bilirubin: 0.4 mg/dL (ref 0.3–1.2)
Total Protein: 6.4 g/dL (ref 6.0–8.3)

## 2013-11-09 LAB — TROPONIN I: Troponin I: <0.3 ng/mL (ref <0.30)

## 2013-11-09 LAB — GLUCOSE, CAPILLARY
GLUCOSE-CAPILLARY: 228 mg/dL — AB (ref 70–99)
Glucose-Capillary: 214 mg/dL — ABNORMAL HIGH (ref 70–99)

## 2013-11-09 MED ORDER — TRAMADOL HCL 50 MG PO TABS: 50.0000 mg | ORAL_TABLET | Freq: Four times a day (QID) | ORAL | Status: DC | PRN

## 2013-11-09 MED ORDER — CLONIDINE HCL 0.2 MG PO TABS: 0.2000 mg | ORAL_TABLET | Freq: Two times a day (BID) | ORAL | Status: DC

## 2013-11-09 MED ORDER — CYCLOBENZAPRINE HCL 10 MG PO TABS: 10.0000 mg | ORAL_TABLET | Freq: Three times a day (TID) | ORAL | Status: DC | PRN

## 2013-11-09 NOTE — Telephone Encounter (Signed)
The patient needs a BMet tomorrow 7/29.  Cr increased to 2.0- likely related to NSAIDs. Left message for patient regarding need for lab tomorrow.

## 2013-11-09 NOTE — Telephone Encounter (Signed)
Brooke Stafford, Can you please make sure she gets this message and has labs drawn? Thanks

## 2013-11-09 NOTE — Discharge Summary (Addendum)
Physician Discharge Summary       Patient ID: Brooke Stafford MRN: [id number] DOB/AGE: 1955-11-18 58 y.o.  Admit date: 11/08/2013 Discharge date: 11/09/2013  Discharge Diagnoses:  Principal Problem:   Precordial pain, atypical, negative MI, Musculature Skeletal pain  Active Problems:   Diabetes mellitus with chronic kidney disease   Dyslipidemia   S/P CABG (coronary artery bypass graft) 2002   Essential hypertension, benign   CKD (chronic kidney disease), stage III   CAD (coronary artery disease) of artery bypass graft; DES to VG-Diag 09/28/13   Discharged Condition: good   Primary Cardiologist: Dr. Jerilynn Mages. Croitoru  Procedures: none  Hospital Course: 58 y.o. female with a history of CAD, DM, CKD, HTN, HLD, PVD, obesity, CABG x 6 2002, NSTEMI 2011 (demand ischemia, med rx at cath). She had SOB, abnl nuc --> cath 06/16 w/ stent SVG-Diag via left radial. Seen in the office 07/21 and says was complaining of left wrist, arm and upper chest/shoulder pain, Rx with Tylenol and f/u with primary MD. Note mentions possible angina symptoms (resolved w/ SL NTG) were rx w/ increased Imdur.  Pt has had left wrist pain with decreased functioning in left hand, soft knot in left AC area, pain up her arm into her shoulder, upper back pain and upper left chest pain since the procedure in June. Her left hand does not function as well and the pain seems to make it difficult to bend her fingers at times. She was not complaining of lower chest pain, but has had some episodes of sharp pain.  She went to the walk-in clinic with this 11/08/13 and was sent to the ER. Also, her BP has been very high, and today was almost [id number] systolic. She was admitted and placed on muscle relaxer and her ultram which she had run out of was restarted.  Cardiac enzymes were negative.   Vascular ultrasound without evidence of stenosis or pseudoaneurysm of Lt upper ext.    Cervical spine with degenerative disease but no acute process.   Flexeril helped her pain.  She was seen and evaluated by Dr. Irish Lack and found stable for discharge.  She will follow up with Dr. Bertrum Sol and her PCP.     Consults: None  Significant Diagnostic Studies:  BMET    Component Value Date/Time   NA 137 11/09/2013 0350   K 4.3 11/09/2013 0350   CL 102 11/09/2013 0350   CO2 24 11/09/2013 0350   GLUCOSE 216* 11/09/2013 0350   BUN 32* 11/09/2013 0350   CREATININE 1.81* 11/09/2013 0350   CREATININE 1.67* 06/02/2013 1514   CALCIUM 9.2 11/09/2013 0350   GFRNONAA 29* 11/09/2013 0350   GFRNONAA 33* 06/02/2013 1514   GFRAA 34* 11/09/2013 0350   GFRAA 39* 06/02/2013 1514    CBC    Component Value Date/Time   WBC 4.7 11/08/2013 1618   RBC 4.37 11/08/2013 1618   HGB 13.3 11/08/2013 1618   HCT 39.7 11/08/2013 1618   PLT 233 11/08/2013 1618   MCV 90.8 11/08/2013 1618   MCH 30.4 11/08/2013 1618   MCHC 33.5 11/08/2013 1618   RDW 13.5 11/08/2013 1618   LYMPHSABS 1.6 09/27/2013 1345   MONOABS 0.3 09/27/2013 1345   EOSABS 0.2 09/27/2013 1345   BASOSABS 0.0 09/27/2013 1345   Troponin <0.30 X 3  CERVICAL SPINE 4+ VIEWS  COMPARISON: None.  FINDINGS:  The cervical spine is visualized to the level of C6-7.  The vertebral body heights are maintained. The alignment is normal.  The prevertebral soft tissues are normal. There is no acute fracture  or static listhesis. There is minimal degenerative disc disease at  C4-5 and C5-6. Bilateral neural foramina are patent.  IMPRESSION:  No acute osseous injury of the cervical spine.    CHEST 2 VIEW  COMPARISON: Single view of the chest 08/03/2013 and 0 01/05/2011.  FINDINGS:  The patient is status post CABG. Heart size is upper normal. Lungs  are clear. No pneumothorax or pleural effusion.  IMPRESSION:  No acute disease.   EKG: Probable left atrial enlargement ST elevation, consider inferior injury Sinus rhythm Left atrial enlargement ST-t wave abnormality No significant change since last tracing  Discharge  Exam: Blood pressure 110/69, pulse 60, temperature 97.9 F (36.6 C), temperature source Oral, resp. rate 18, height 5' 10.5" (1.791 m), weight 242 lb 4.6 oz (109.9 kg), SpO2 97.00%.    Disposition: 01-Home or Self Care     Medication List    STOP taking these medications       BC HEADACHE POWDER PO      TAKE these medications       albuterol 108 (90 BASE) MCG/ACT inhaler  Commonly known as:  PROVENTIL HFA;VENTOLIN HFA  Inhale 2 puffs into the lungs every 6 (six) hours as needed. For shortness of breath     allopurinol 300 MG tablet  Commonly known as:  ZYLOPRIM  Take 300 mg by mouth daily.     amLODipine 10 MG tablet  Commonly known as:  NORVASC  Take 1 tablet (10 mg total) by mouth daily.     aspirin 81 MG chewable tablet  Chew 1 tablet (81 mg total) by mouth daily.     atorvastatin 40 MG tablet  Commonly known as:  LIPITOR  Take 1 tablet (40 mg total) by mouth daily.     B-D INSULIN SYRINGE 1CC U-100 1 ML Misc  Generic drug:  Insulin Syringes (Disposable)  1 each by Does not apply route See admin instructions. Use with insulin daily.     cloNIDine 0.2 MG tablet  Commonly known as:  CATAPRES  Take 0.2 mg by mouth 2 (two) times daily.     clopidogrel 75 MG tablet  Commonly known as:  PLAVIX  Take 1 tablet (75 mg total) by mouth daily.     cyclobenzaprine 10 MG tablet  Commonly known as:  FLEXERIL  Take 1 tablet (10 mg total) by mouth 3 (three) times daily as needed for muscle spasms.     ezetimibe 10 MG tablet  Commonly known as:  ZETIA  Take 1 tablet (10 mg total) by mouth daily.     fluticasone 50 MCG/ACT nasal spray  Commonly known as:  FLONASE  Place 2 sprays into both nostrils daily as needed for allergies or rhinitis.     gabapentin 400 MG capsule  Commonly known as:  NEURONTIN  Take 400 mg by mouth 2 (two) times daily.     insulin glargine 100 UNIT/ML injection  Commonly known as:  LANTUS  Inject 0.6 mLs (60 Units total) into the skin at  bedtime.     isosorbide mononitrate 60 MG 24 hr tablet  Commonly known as:  IMDUR  Take 1 tablet (60 mg total) by mouth daily.     levothyroxine 25 MCG tablet  Commonly known as:  SYNTHROID, LEVOTHROID  Take 1 tablet (25 mcg total) by mouth daily before breakfast.     loratadine 10 MG tablet  Commonly known as:  CLARITIN  Take 1 tablet (10 mg total) by mouth daily.     metFORMIN 1000 MG tablet  Commonly known as:  GLUCOPHAGE  Take 1 tablet (1,000 mg total) by mouth daily with supper.     metoprolol succinate 100 MG 24 hr tablet  Commonly known as:  TOPROL-XL  Take 1 tablet (100 mg total) by mouth daily. Take with or immediately following a meal.     nitroGLYCERIN 0.4 MG SL tablet  Commonly known as:  NITROSTAT  Place 1 tablet (0.4 mg total) under the tongue every 5 (five) minutes as needed. For chest     NOVOLOG FLEXPEN 100 UNIT/ML FlexPen  Generic drug:  insulin aspart  Inject 20-35 Units into the skin 3 (three) times daily with meals. Inject 30 units subq at breakfast, inject 20 units subq at lunch and inject 35 units subq at supper.     polyethylene glycol packet  Commonly known as:  MIRALAX / GLYCOLAX  Take 17 g by mouth daily as needed for moderate constipation.     TIGER BALM ARTHRITIS RUB 11-11 % Crea  Generic drug:  Menthol-Camphor  Apply 1 application topically 2 (two) times daily.     traMADol 50 MG tablet  Commonly known as:  ULTRAM  Take 1 tablet (50 mg total) by mouth every 6 (six) hours as needed for moderate pain.     traZODone 50 MG tablet  Commonly known as:  DESYREL  Take 25 mg by mouth at bedtime.       Follow-up Information   Follow up with Sanda Klein, MD On 11/18/2013. (at 11:30 am)    Specialty:  Cardiology   Contact information:   [address] [place] [address] [phone]        Discharge Instructions:  Heart Healthy Diabetic Diet  See primary care MD for further shoulder pain.  We refilled ultram and wrote  for flexeril- the muscle relaxant.  Your PCP may need to arrange further testing or physical therapy.  Call if you have problems.   Signed: Isaiah Serge Nurse Practitioner-Certified [facility]: [facility] 11/09/2013, 3:01 PM  Time spent on discharge : 35 minutes.    I have examined the patient and reviewed assessment and plan and discussed with patient.  Agree with above as stated.  Cr increase needs to be followed closely.  Repeat Cr came back after the patient left.  Will have her come to the office for repeat Cr tomorrow.  Will have her drink a lot of fluid as well to try and stay well hydrated.  I suspect her acute on chronic renal failure is related to NSAIDs in the hospital.   Jiovany Scheffel S.

## 2013-11-09 NOTE — Progress Notes (Signed)
Utilization review completed.  

## 2013-11-09 NOTE — Progress Notes (Signed)
SUBJECTIVE:  No chest pain.  She does have left shoulder pain that is relieved by raising her left arm over her head.  That is the same pain that brought her to the hospital.   OBJECTIVE:   Vitals:   Filed Vitals:   11/08/13 2120 11/09/13 0607 11/09/13 1046 11/09/13 1048  BP: 151/88 126/80 151/91   Pulse: 75 69  66  Temp: 98.1 F (36.7 C) 97.7 F (36.5 C)    TempSrc: Oral Oral    Resp: 18 18    Height: 5' 10.5" (1.791 m)     Weight: 242 lb 8 oz (109.997 kg) 242 lb 4.6 oz (109.9 kg)    SpO2: 100% 97%     I&O's:  No intake or output data in the 24 hours ending 11/09/13 1424  TELEMETRY: Reviewed telemetry pt in NSR:     PHYSICAL EXAM General: Well developed, well nourished, in no acute distress Head:   Normal cephalic and atramatic  Lungs:  Clear bilaterally to auscultation. Heart:   HRRR S1 S2  No JVD.   Abdomen: abdomen soft and non-tender Msk:  Back normal,  Normal strength and tone for age. Extremities:  2+ left radial pulse ;No edema.  No pain to palpation at the left antecubital area. Neuro: Alert and oriented. Psych:  Normal affect, responds appropriately   LABS: Basic Metabolic Panel:  Recent Labs  11/08/13 1618 11/09/13 0350  NA 141 137  K 4.1 4.3  CL 103 102  CO2 25 24  GLUCOSE 182* 216*  BUN 24* 32*  CREATININE 1.35* 1.81*  CALCIUM 10.2 9.2  MG 1.7  --    Liver Function Tests:  Recent Labs  11/08/13 1618 11/09/13 0350  AST 23 22  ALT 38* 34  ALKPHOS 116 98  BILITOT 0.5 0.4  PROT 7.3 6.4  ALBUMIN 3.9 3.3*   No results found for this basename: LIPASE, AMYLASE,  in the last 72 hours CBC:  Recent Labs  11/08/13 1618  WBC 4.7  HGB 13.3  HCT 39.7  MCV 90.8  PLT 233   Cardiac Enzymes:  Recent Labs  11/08/13 2230 11/09/13 0350 11/09/13 0918  TROPONINI <0.30 <0.30 <0.30   BNP: No components found with this basename: POCBNP,  D-Dimer: No results found for this basename: DDIMER,  in the last 72 hours Hemoglobin A1C: No  results found for this basename: HGBA1C,  in the last 72 hours Fasting Lipid Panel: No results found for this basename: CHOL, HDL, LDLCALC, TRIG, CHOLHDL, LDLDIRECT,  in the last 72 hours Thyroid Function Tests: No results found for this basename: TSH, T4TOTAL, FREET3, T3FREE, THYROIDAB,  in the last 72 hours Anemia Panel: No results found for this basename: VITAMINB12, FOLATE, FERRITIN, TIBC, IRON, RETICCTPCT,  in the last 72 hours Coag Panel:   Lab Results  Component Value Date   INR 0.98 09/27/2013   INR 0.91 08/03/2013   INR 0.95 11/25/2012    RADIOLOGY: Dg Chest 2 View  11/08/2013   CLINICAL DATA:  Chest pain.  EXAM: CHEST  2 VIEW  COMPARISON:  Single view of the chest 08/03/2013 and 0 01/05/2011.  FINDINGS: The patient is status post CABG. Heart size is upper normal. Lungs are clear. No pneumothorax or pleural effusion.  IMPRESSION: No acute disease.   Electronically Signed   By: Inge Rise M.D.   On: 11/08/2013 16:46   Dg Cervical Spine Complete  11/09/2013   CLINICAL DATA:  Left knee pain  EXAM: CERVICAL SPINE  4+ VIEWS  COMPARISON:  None.  FINDINGS: The cervical spine is visualized to the level of C6-7.  The vertebral body heights are maintained. The alignment is normal. The prevertebral soft tissues are normal. There is no acute fracture or static listhesis. There is minimal degenerative disc disease at C4-5 and C5-6. Bilateral neural foramina are patent.  IMPRESSION: No acute osseous injury of the cervical spine.   Electronically Signed   By: Kathreen Devoid   On: 11/09/2013 08:42      ASSESSMENT: [name]:  History of CAD, atypical chest pain  1) ruled out for MI with enzymes. Her pain is atypical for ischemia. Awaiting results of left upper extremity Doppler. If this is unrevealing, could consider discharge. The only outstanding issue is an increase in creatinine from yesterday to today. It does not appear that she got any IV contrast. We'll repeat be met to see if the lab value  was spurious.  She did receive IV Toradol as well as ibuprofen. She'll need close followup of her renal function.  [name], MD  11/09/2013  2:24 PM

## 2013-11-09 NOTE — Discharge Instructions (Signed)
Heart Healthy Diabetic Diet  See primary care MD for further shoulder pain.  We refilled ultram and wrote for flexeril- the muscle relaxant.  Your PCP may need to arrange further testing or physical therapy.  Call if you have problems.  Diabetes Mellitus and Food It is important for you to manage your blood sugar (glucose) level. Your blood glucose level can be greatly affected by what you eat. Eating healthier foods in the appropriate amounts throughout the day at about the same time each day will help you control your blood glucose level. It can also help slow or prevent worsening of your diabetes mellitus. Healthy eating may even help you improve the level of your blood pressure and reach or maintain a healthy weight.  HOW CAN FOOD AFFECT ME? Carbohydrates Carbohydrates affect your blood glucose level more than any other type of food. Your dietitian will help you determine how many carbohydrates to eat at each meal and teach you how to count carbohydrates. Counting carbohydrates is important to keep your blood glucose at a healthy level, especially if you are using insulin or taking certain medicines for diabetes mellitus. Alcohol Alcohol can cause sudden decreases in blood glucose (hypoglycemia), especially if you use insulin or take certain medicines for diabetes mellitus. Hypoglycemia can be a life-threatening condition. Symptoms of hypoglycemia (sleepiness, dizziness, and disorientation) are similar to symptoms of having too much alcohol.  If your health care provider has given you approval to drink alcohol, do so in moderation and use the following guidelines:  Women should not have more than one drink per day, and men should not have more than two drinks per day. One drink is equal to:  12 oz of beer.  5 oz of wine.  1 oz of hard liquor.  Do not drink on an empty stomach.  Keep yourself hydrated. Have water, diet soda, or unsweetened iced tea.  Regular soda, juice, and other  mixers might contain a lot of carbohydrates and should be counted. WHAT FOODS ARE NOT RECOMMENDED? As you make food choices, it is important to remember that all foods are not the same. Some foods have fewer nutrients per serving than other foods, even though they might have the same number of calories or carbohydrates. It is difficult to get your body what it needs when you eat foods with fewer nutrients. Examples of foods that you should avoid that are high in calories and carbohydrates but low in nutrients include:  Trans fats (most processed foods list trans fats on the Nutrition Facts label).  Regular soda.  Juice.  Candy.  Sweets, such as cake, pie, doughnuts, and cookies.  Fried foods. WHAT FOODS CAN I EAT? Have nutrient-rich foods, which will nourish your body and keep you healthy. The food you should eat also will depend on several factors, including:  The calories you need.  The medicines you take.  Your weight.  Your blood glucose level.  Your blood pressure level.  Your cholesterol level. You also should eat a variety of foods, including:  Protein, such as meat, poultry, fish, tofu, nuts, and seeds (lean animal proteins are best).  Fruits.  Vegetables.  Dairy products, such as milk, cheese, and yogurt (low fat is best).  Breads, grains, pasta, cereal, rice, and beans.  Fats such as olive oil, trans fat-free margarine, canola oil, avocado, and olives. DOES EVERYONE WITH DIABETES MELLITUS HAVE THE SAME MEAL PLAN? Because every person with diabetes mellitus is different, there is not one meal plan that  works for everyone. It is very important that you meet with a dietitian who will help you create a meal plan that is just right for you. Document Released: 12/27/2004 Document Revised: 04/06/2013 Document Reviewed: 02/26/2013 [facility] Patient Information 2015 [place], [place]. This information is not intended to replace advice given to you by your health care  provider. Make sure you discuss any questions you have with your health care provider.

## 2013-11-09 NOTE — Progress Notes (Signed)
VASCULAR LAB PRELIMINARY RESULTS  Upper Extremity Arterial completed    RIGHT   LEFT    PRESSURE WAVEFORM  PRESSURE WAVEFORM  SUBCLAVIAN NA  SUBCLAVIAN NA Triphasic  AXILLARY NA  AXILLARY NA Triphasic  BRACHIAL 134 Triphasic BRACHIAL 133 Triphasic  RADIAL 135 Triphasic RADIAL 154 Triphasic  ULNAR 136 Triphasic ULNAR 133 Triphasic    RIGHT LEFT  Index Finger 1107mm Hg 120 mmHg   All pressures and Doppler waveforms are normal with no evidence of stenosis. There is no evidence of a pseudoaneurysm of the left upper extremity.  Brooke Stafford VS 11/09/2013 12:36 PM

## 2013-11-09 NOTE — Progress Notes (Signed)
Inpatient Diabetes Program Recommendations  AACE/ADA: New Consensus Statement on Inpatient Glycemic Control (2013)  Target Ranges:  Prepandial:   less than 140 mg/dL      Peak postprandial:   less than 180 mg/dL (1-2 hours)      Critically ill patients:  140 - 180 mg/dL   Results for Brooke Stafford, Brooke Stafford (MRN [id number]) as of 11/09/2013 13:37  Ref. Range 11/08/2013 21:19 11/09/2013 08:16 11/09/2013 12:53  Glucose-Capillary Latest Range: 70-99 mg/dL 155 (H) 214 (H) 228 (H)  Reason for Assessment: elevated BG Diabetes history: Type 2, A1C 10.5% Outpatient Diabetes medications: Lantus 60 units at bedtime, Novolog 30 units with breakfast,  Novolog  20 units with lunch and Novolog 35 units pre- supper.   Current orders for Inpatient glycemic control: Lantus 60 units at bed, Novolog 20 units with meals.    BG elevated and hospital pre-meal Novolog doses are considerably lower than her home doses of pre-meal insulin. Please consider adding Novolog correction resistant scale 0-20units pre-meals.  If fasting CBGs remain elevated may consider increasing basal insulin Lantus as well.   Gentry Fitz, RN, BA, MHA, CDE Diabetes Coordinator Inpatient Diabetes Program  [phone] (Team Pager) [phone] [facility]) 11/09/2013 1:45 PM

## 2013-11-10 ENCOUNTER — Telehealth: Payer: Self-pay | Admitting: Internal Medicine

## 2013-11-10 DIAGNOSIS — Z79899 Other long term (current) drug therapy: Secondary | ICD-10-CM | POA: Diagnosis not present

## 2013-11-10 NOTE — Telephone Encounter (Signed)
Returned call. Left VM with detailed instructions regarding labs, why they need to be done, where to have them done, and that no appointment is necessary.

## 2013-11-10 NOTE — Telephone Encounter (Signed)
LM on machine instructing patient to get lab work - left information regarding locations and that no appointment is needed. Asked that patient return call so that I know she got the message.

## 2013-11-10 NOTE — Telephone Encounter (Signed)
Called patient's home number - goes to VM Called significant other's number - he was with patient but she was sleeping. Informed him that she will need blood work TODAY (no appmt needed). He will let her know.

## 2013-11-10 NOTE — Telephone Encounter (Signed)
Returning your call from this morning. °

## 2013-11-11 LAB — BASIC METABOLIC PANEL
BUN: 27 mg/dL — ABNORMAL HIGH (ref 6–23)
CALCIUM: 9.7 mg/dL (ref 8.4–10.5)
CHLORIDE: 106 meq/L (ref 96–112)
CO2: 26 mEq/L (ref 19–32)
Creat: 1.47 mg/dL — ABNORMAL HIGH (ref 0.50–1.10)
GLUCOSE: 298 mg/dL — AB (ref 70–99)
POTASSIUM: 4.4 meq/L (ref 3.5–5.3)
SODIUM: 139 meq/L (ref 135–145)

## 2013-11-18 ENCOUNTER — Encounter: Payer: Self-pay | Admitting: Cardiovascular Disease

## 2013-11-18 ENCOUNTER — Ambulatory Visit (INDEPENDENT_AMBULATORY_CARE_PROVIDER_SITE_OTHER): Payer: Medicare Other | Admitting: Cardiovascular Disease

## 2013-11-18 VITALS — BP 118/72 | HR 64 | Resp 16 | Ht 70.5 in | Wt 243.7 lb

## 2013-11-18 DIAGNOSIS — I200 Unstable angina: Secondary | ICD-10-CM

## 2013-11-18 DIAGNOSIS — Z951 Presence of aortocoronary bypass graft: Secondary | ICD-10-CM

## 2013-11-18 DIAGNOSIS — I2589 Other forms of chronic ischemic heart disease: Secondary | ICD-10-CM

## 2013-11-18 DIAGNOSIS — I10 Essential (primary) hypertension: Secondary | ICD-10-CM | POA: Diagnosis not present

## 2013-11-18 DIAGNOSIS — I255 Ischemic cardiomyopathy: Secondary | ICD-10-CM

## 2013-11-18 NOTE — Patient Instructions (Signed)
Dr. Sallyanne Kuster recommends that you schedule a follow-up appointment in: 6 Months.

## 2013-11-19 ENCOUNTER — Telehealth: Payer: Self-pay | Admitting: Cardiovascular Disease

## 2013-11-19 NOTE — Telephone Encounter (Signed)
Ms. Scheurer was returning Brooke Stafford's phone call and wanted her to know that she is taking Prevacid 10mg . Please call back.  Thanks

## 2013-11-19 NOTE — Telephone Encounter (Signed)
Message forwarded to [facility] and Dr. Loletha Grayer as Juluis Rainier

## 2013-11-21 ENCOUNTER — Encounter: Payer: Self-pay | Admitting: Cardiovascular Disease

## 2013-11-21 NOTE — Assessment & Plan Note (Signed)
Post the high-risk of complications with clonidine rebound or beta blocker rebound.

## 2013-11-21 NOTE — Progress Notes (Signed)
Patient ID: Brooke Stafford, female   DOB: 10-18-1955, 58 y.o.   MRN: [id number]      Reason for office visit All of the hospitalization; CAD status post recent PCI/stent  Brooke Stafford was hospitalized in June 2015 for crescendo angina. Cardiac catheterization showed a patent but severely stenotic saphenous vein graft to the diagonal artery, as was chronic occlusion of the saphenous vein graft to the ramus intermedius (LIMA to the LAD patent, SVG to PDA patent). She underwent stenting of the SVG to the diagonal artery.   Returned in late July with atypical chest discomfort and pain in her left wrist and arm (she had a left radial catheterization for her stent procedure in June). Her blood pressure was also severely elevated - probably due to clonidine rebound. Ultrasonography showed no evidence of arterial injury/pseudoaneurysm in the left upper extremity. Her chest pain was felt to be atypical. Cardiac enzymes were normal. Cervical spine x-rays show degenerative disease without acute abnormalities. She was discharged from the hospital in July 28.   She has a past medical history significant for severe type 2 diabetes mellitus requiring insulin therapy, systemic hypertension, hyperlipidemia, obesity, PVD S/P LSFA PTA initally in 2012 and again in 2014, coronary artery disease, S/P CABG x 6 in 2002. Her last cardiac catheterization was done in July 2011 when she presented with sepsis and medication non compliance and had a NSTEMI from demand ischemia. Cath then showed a patent LIMA to LAD, patent SVG to diagonal, and patent sequential SVG to branches of the right coronary artery. The plan was for medical Rx. She also has a mild ischemic cardiomyopathy with a left ventricular ejection fraction of 45% to 50% by echo 5/11. Stent to the SVG to diagonal artery performed June 2015 for crescendo angina (3.5 x 23 mm Xience [place])   Today she has no cardiac complaints. She has been gaining weight and swelling  secondary to treatment with prednisone for rheumatoid arthritis. She has a followup with her rheumatologist scheduled in the near future.    Allergies  Allergen Reactions  . Hydralazine Shortness Of Breath  . Penicillins Cross Reactors Hives    And high fever  . Adhesive [Tape] Rash    bruising    Current Outpatient Prescriptions  Medication Sig Dispense Refill  . albuterol (PROVENTIL HFA;VENTOLIN HFA) 108 (90 BASE) MCG/ACT inhaler Inhale 2 puffs into the lungs every 6 (six) hours as needed. For shortness of breath  1 Inhaler  3  . allopurinol (ZYLOPRIM) 300 MG tablet Take 300 mg by mouth daily.       [name] amLODipine (NORVASC) 10 MG tablet Take 1 tablet (10 mg total) by mouth daily.  30 tablet  2  . aspirin 81 MG chewable tablet Chew 1 tablet (81 mg total) by mouth daily.      [name] atorvastatin (LIPITOR) 40 MG tablet Take 1 tablet (40 mg total) by mouth daily.  90 tablet  3  . cloNIDine (CATAPRES) 0.2 MG tablet Take 1 tablet (0.2 mg total) by mouth 2 (two) times daily.  60 tablet  5  . clopidogrel (PLAVIX) 75 MG tablet Take 1 tablet (75 mg total) by mouth daily.  30 tablet  6  . cyclobenzaprine (FLEXERIL) 10 MG tablet Take 1 tablet (10 mg total) by mouth 3 (three) times daily as needed for muscle spasms.  30 tablet  0  . ezetimibe (ZETIA) 10 MG tablet Take 1 tablet (10 mg total) by mouth daily.  90 tablet  3  .  fluticasone (FLONASE) 50 MCG/ACT nasal spray Place 2 sprays into both nostrils daily as needed for allergies or rhinitis.      [name] gabapentin (NEURONTIN) 400 MG capsule Take 400 mg by mouth 2 (two) times daily.      . insulin aspart (NOVOLOG FLEXPEN) 100 UNIT/ML FlexPen Inject 20-35 Units into the skin 3 (three) times daily with meals. Inject 30 units subq at breakfast, inject 20 units subq at lunch and inject 35 units subq at supper.      . insulin glargine (LANTUS) 100 UNIT/ML injection Inject 0.6 mLs (60 Units total) into the skin at bedtime.  60 mL  3  . Insulin Syringes, Disposable,  (B-D INSULIN SYRINGE 1CC) U-100 1 ML MISC 1 each by Does not apply route See admin instructions. Use with insulin daily.      . isosorbide mononitrate (IMDUR) 60 MG 24 hr tablet Take 1 tablet (60 mg total) by mouth daily.  90 tablet  3  . levothyroxine (SYNTHROID, LEVOTHROID) 25 MCG tablet Take 1 tablet (25 mcg total) by mouth daily before breakfast.  30 tablet  5  . loratadine (CLARITIN) 10 MG tablet Take 1 tablet (10 mg total) by mouth daily.  30 tablet  11  . Menthol-Camphor (TIGER BALM ARTHRITIS RUB) 11-11 % CREA Apply 1 application topically 2 (two) times daily.      . metFORMIN (GLUCOPHAGE) 1000 MG tablet Take 1 tablet (1,000 mg total) by mouth daily with supper.  90 tablet  3  . metoprolol succinate (TOPROL-XL) 100 MG 24 hr tablet Take 1 tablet (100 mg total) by mouth daily. Take with or immediately following a meal.  90 tablet  3  . nitroGLYCERIN (NITROSTAT) 0.4 MG SL tablet Place 1 tablet (0.4 mg total) under the tongue every 5 (five) minutes as needed. For chest  30 tablet  6  . polyethylene glycol (MIRALAX / GLYCOLAX) packet Take 17 g by mouth daily as needed for moderate constipation.      . traMADol (ULTRAM) 50 MG tablet Take 1 tablet (50 mg total) by mouth every 6 (six) hours as needed for moderate pain.  60 tablet  0  . traZODone (DESYREL) 50 MG tablet Take 25 mg by mouth at bedtime.       No current facility-administered medications for this visit.    Past Medical History  Diagnosis Date  . Asthma   . Anxiety   . GERD (gastroesophageal reflux disease)   . Coronary artery disease 2002    CABG x 6. Cath 5/11- med Rx  . Hypertension   . Peripheral vascular disease 12/12    LSFA PTA  . Anemia   . Hyperlipidemia   . Chronic renal insufficiency, stage II (mild)     followed  by [facility]  . CAD (coronary artery disease) 2002; 2015    CABG x 6 2002, cath 2011- med Rx stent DES VG-Diag  . Hypothyroid     treated  . Obesity (BMI 35.0-39.9 without comorbidity)   . CHF  (congestive heart failure)     "in 2002" (11/26/2012)  . Myocardial infarction 2000; 2002; 2011?  [name] Chronic bronchitis     "q year; in the winter" (08/04/2013)  . Type II diabetes mellitus   . Depression   . Pneumonia     "3 times I think" (08/04/2013)  . History of blood transfusion 2002    "when I had OHS"  . Migraines     "couple times/year" (08/04/2013)  . Headache     "~  q week" (08/04/2013)  . Arthritis     "stiff fingers and knees" (08/04/2013)  . Gout     "right big toe"  . Anginal pain   . CAD (coronary artery disease) of artery bypass graft; DES to VG-Diag 09/28/13 11/09/2013    Past Surgical History  Procedure Laterality Date  . Cholecystectomy  1982  . Cesarean section  1978; 1980  . Tubal ligation  1980  . Breast cyst excision Right 1970's  . Coronary artery bypass graft  11/20/2000    x6 LIMA to distal LAD, svg to first diag, svg to ramus intermediate branch and swquential SVG to cir marginal branch, SVG to posterior descending coronary and sequential SVG to first right posterolateral branch  . Cardiac catheterization  2002  . Coronary angioplasty with stent placement  2004; 2012    "I have 2 stents" (08/04/2013)  . Peripheral arterial stent graft Left     SFA/notes 04/07/2011 (11/30/2012)  . Nm myocar perf wall motion  08/27/2004    negative  . Appendectomy  1980  . Coronary angioplasty with stent placement  09/28/13    PTCA/ DES Xience stent to VG-Diag     Family History  Problem Relation Age of Onset  . Diabetes Mother   . Hypertension Mother   . Stroke Mother   . Hypertension Father   . Hypertension Brother   . Hypertension Sister   . Diabetes Sister   . Hyperlipidemia Sister     History   Social History  . Marital Status: Divorced    Spouse Name: N/A    Number of Children: N/A  . Years of Education: N/A   Occupational History  . Not on file.   Social History Main Topics  . Smoking status: Former Smoker -- 1.00 packs/day for 25 years    Types:  Cigarettes    Quit date: 04/04/2000  . Smokeless tobacco: Never Used  . Alcohol Use: Yes     Comment: 08/04/2013  "have a glass of red wine on my birthday q yr; that's it"  . Drug Use: No  . Sexual Activity: No   Other Topics Concern  . Not on file   Social History Narrative  . No narrative on file    Review of systems: The patient specifically denies any chest pain at rest exertion, dyspnea at rest or with exertion, orthopnea, paroxysmal nocturnal dyspnea, syncope, palpitations, focal neurological deficits, intermittent claudication, lower extremity edema, unexplained weight gain, cough, hemoptysis or wheezing.   PHYSICAL EXAM BP 118/72  Pulse 64  Resp 16  Ht 5' 10.5" (1.791 m)  Wt 243 lb 11.2 oz (110.542 kg)  BMI 34.46 kg/m2 General: Alert, oriented x3, no distress  Head: no evidence of trauma, PERRL, EOMI, no exophtalmos or lid lag, no myxedema, no xanthelasma; normal ears, nose and oropharynx  Neck: normal jugular venous pulsations and no hepatojugular reflux; brisk carotid pulses without delay and no carotid bruits  Chest: clear to auscultation, no signs of consolidation by percussion or palpation, normal fremitus, symmetrical and full respiratory excursions; large keloid scar at her sternotomy site  Cardiovascular: normal position and quality of the apical impulse, regular rhythm, normal first and second heart sounds, no murmurs, rubs or gallops  Abdomen: no tenderness or distention, no masses by palpation, no abnormal pulsatility or arterial bruits, normal bowel sounds, no hepatosplenomegaly  Extremities: no clubbing, cyanosis or edema; 2+ radial, ulnar and brachial pulses bilaterally; 2+ right femoral, posterior tibial and dorsalis pedis pulses; 2+ left  femoral, posterior tibial and dorsalis pedis pulses; no subclavian or femoral bruits  Neurological: grossly nonfocal   Lipid Panel     Component Value Date/Time   CHOL 180 08/04/2013 0430   TRIG 230* 08/04/2013 0430   HDL  35* 08/04/2013 0430   CHOLHDL 5.1 08/04/2013 0430   VLDL 46* 08/04/2013 0430   LDLCALC 99 08/04/2013 0430    BMET    Component Value Date/Time   NA 139 11/10/2013 1248   K 4.4 11/10/2013 1248   CL 106 11/10/2013 1248   CO2 26 11/10/2013 1248   GLUCOSE 298* 11/10/2013 1248   BUN 27* 11/10/2013 1248   CREATININE 1.47* 11/10/2013 1248   CREATININE 2.03* 11/09/2013 1620   CALCIUM 9.7 11/10/2013 1248   GFRNONAA 26* 11/09/2013 1620   GFRNONAA 33* 06/02/2013 1514   GFRAA 30* 11/09/2013 1620   GFRAA 39* 06/02/2013 1514     ASSESSMENT AND PLAN Ischemic cardiomyopathy Mildly Depressed LVEF. No current signs of acute heart failure  S/P CABG (coronary artery bypass graft) 2002 Patent LIMA to LAD, patent SVG to PDA, occluded SVG to ramus intermedius, stent in SVG to diagonal June 2015. Currently free of angina. Discussed critical role compliance with dual antiplatelet therapy to avoid acute stent thrombosis over the next 10 months. Actually, she will remain on lifelong aspirin +thienopyridine therapy, but it is important that this not be interrupted at all in the next year  Essential hypertension, benign Post the high-risk of complications with clonidine rebound or beta blocker rebound.   Patient Instructions  Dr. Sallyanne Kuster recommends that you schedule a follow-up appointment in: 6 Months.      Holli Humbles, MD, [place] [phone] office [phone] pager

## 2013-11-21 NOTE — Assessment & Plan Note (Signed)
Mildly Depressed LVEF. No current signs of acute heart failure

## 2013-11-21 NOTE — Telephone Encounter (Signed)
Please call and clarify. I don't think she is talking about prevacid and it does not come in 10 mg

## 2013-11-21 NOTE — Assessment & Plan Note (Signed)
Patent LIMA to LAD, patent SVG to PDA, occluded SVG to ramus intermedius, stent in SVG to diagonal June 2015. Currently free of angina. Discussed critical role compliance with dual antiplatelet therapy to avoid acute stent thrombosis over the next 10 months. Actually, she will remain on lifelong aspirin +thienopyridine therapy, but it is important that this not be interrupted at all in the next year

## 2013-11-22 ENCOUNTER — Encounter: Payer: Self-pay | Admitting: Internal Medicine

## 2013-11-22 ENCOUNTER — Ambulatory Visit (INDEPENDENT_AMBULATORY_CARE_PROVIDER_SITE_OTHER): Payer: Medicare Other | Admitting: Internal Medicine

## 2013-11-22 VITALS — BP 122/78 | HR 73 | Temp 98.1°F | Resp 12 | Wt 246.0 lb

## 2013-11-22 DIAGNOSIS — I200 Unstable angina: Secondary | ICD-10-CM

## 2013-11-22 DIAGNOSIS — N189 Chronic kidney disease, unspecified: Secondary | ICD-10-CM | POA: Diagnosis not present

## 2013-11-22 DIAGNOSIS — E1129 Type 2 diabetes mellitus with other diabetic kidney complication: Secondary | ICD-10-CM

## 2013-11-22 DIAGNOSIS — E1122 Type 2 diabetes mellitus with diabetic chronic kidney disease: Secondary | ICD-10-CM

## 2013-11-22 NOTE — Patient Instructions (Addendum)
-   Increase the Metformin to 1000 mg at dinnertime  - Continue Lantus 60 units at bedtime - Increase Novolog as follows: 30 units before breakfast 20 units before lunch 35 units before dinner - add the following sliding scale of NovoLog: - 150- 165: + 1 unit  - 166- 180: + 2 units  - 181- 195: + 3 units  - 196- 210: + 4 units  - 211- 225: + 5 units  - >225: + 6 units  Please return in 1 month with your sugar log.

## 2013-11-22 NOTE — Progress Notes (Signed)
Patient ID: DELLANIRA ZOELLNER, female   DOB: [birth year], 58 y.o.   MRN: [id number]  HPI: MARGHERITA HOSP is a 58 y.o.-year-old female, returning for f/u for DM2, dx 2006, insulin-dependent since 2012, uncontrolled, with complications (CKD, iCMP, CAD - s/p CABG x 6, 2002, PN).  Last hemoglobin A1c was: Lab Results  Component Value Date   HGBA1C 10.5* 09/27/2013   HGBA1C 10.7* 08/04/2013   HGBA1C 10.2 06/02/2013  She had a steroid inj for gout ion 10/19/2013.  Pt is on a regimen of: - Metformin 500 mg at dinnertime (only takes it every other day 2/2 kidney dysfxn) - Lantus 70 >> 60 units qhs - bottle - Novolog - pen: 30 units before breakfast 20 units before lunch 35 units before dinner We stopped Glimepiride 4 mg in am. We stopped Tradjenta 5 mg daily.   Pt checks her sugars 2-3 a day and they are improved: - am: high 100s-low 200s >> 116-170 (if cookies at night: 190-200 in am; if does not take Lantus: 190-200 in am) - 2h after b'fast: 240-250 >> 197 - before lunch: 240-250 >> 144, 173 - 2h after lunch: 160-170 >> 270 x1 - before dinner: 140 >> n/c - 2h after dinner: high 100s-215 >> 151, 159, 343 (cookies) - bedtime: 176, 193, 223 (cookies) No lows. Lowest sugar was 116; she has hypoglycemia awareness at 100.  Highest sugar was 343 x1.  Pt's meals are: - Breakfast: toast 2 slices - Lunch: sandwich, fruit - Dinner: meat, veggies, 1 starch - Snacks: 2: fruit or sandwich, diet Pepsi, 1/2 banana, PB crackers  - She has CKD, last BUN/creatinine:  Lab Results  Component Value Date   BUN 27* 11/10/2013   CREATININE 1.47* 11/10/2013  Decreased from 1.8. Not on ACEI.  - last set of lipids: Lab Results  Component Value Date   CHOL 180 08/04/2013   HDL 35* 08/04/2013   LDLCALC 99 08/04/2013   TRIG 230* 08/04/2013   CHOLHDL 5.1 08/04/2013  On Zetia and Lipitor. Takes 1 ASA a day (81 mg) - last eye exam was in 08/2013. No DR reportedly - ?. She has mild glaucoma.  - + numbness and  tingling in her feet. She has PN >> takes gabapentin 400 mg 1-2x a day.  I reviewed pt's medications, allergies, PMH, social hx, family hx and no changes required, except as mentioned above.  ROS: Constitutional: + weight gain, improved fatigue, + hot flushes Eyes: no blurry vision, no xerophthalmia ENT: + sore throat, no nodules palpated in throat, no dysphagia/odynophagia, no hoarseness, + tinnitus, + hypoacusis Cardiovascular: no CP/SOB/no palpitations/leg swelling Respiratory: no cough/SOB/wheezing Gastrointestinal: no N/V/D/+ C, no heartburn Musculoskeletal: + muscle aches/+ joint aches Skin: no rashes Neurological: no tremors/numbness/tingling/dizziness  PE: BP 122/78  Pulse 73  Temp(Src) 98.1 F (36.7 C) (Oral)  Resp 12  Wt 246 lb (111.585 kg)  SpO2 96% Wt Readings from Last 3 Encounters:  11/22/13 246 lb (111.585 kg)  11/18/13 243 lb 11.2 oz (110.542 kg)  11/09/13 242 lb 4.6 oz (109.9 kg)   Constitutional: obese, in NAD Eyes: PERRLA, EOMI, no exophthalmos ENT: moist mucous membranes, no thyromegaly, no cervical lymphadenopathy Cardiovascular: RRR, No MRG, + R ankle swelling - gout Respiratory: CTA B Gastrointestinal: abdomen soft, NT, ND, BS+ Musculoskeletal: no deformities, strength intact in all 4 Skin: moist, warm, no rashes, + hirsutism Neurological: no tremor with outstretched hands, DTR normal in all 4  ASSESSMENT: 1. DM2, insulin-dependent, uncontrolled, with complications - CKD -  iCMP, CAD - s/p CABG x 6, 2002, s/p stent 2006, s/p stent 09/2013 - Dr. Sallyanne Kuster - PN  PLAN:  1. Patient with long-standing, uncontrolled diabetes, on basal-bolus injectable and oral antidiabetic regimen, with improvement in her sugars except when she eats cookies or when she does not take Lantus. - We discussed about options for treatment, and I suggested to add the following SSI of NovoLog.:  Patient Instructions  - Increase the Metformin to 1000 mg at dinnertime  - Continue  Lantus 60 units at bedtime - Increase Novolog as follows: 30 units before breakfast 20 units before lunch 35 units before dinner - add the following sliding scale of NovoLog: - 150- 165: + 1 unit  - 166- 180: + 2 units  - 181- 195: + 3 units  - 196- 210: + 4 units  - 211- 225: + 5 units  - >225: + 6 units  Please return in 1 month with your sugar log.  - at next visit, we may need to tailor the insulin doses to the size of her meals. We did discuss this at this visit and I advised her to add 5 units of NovoLog if she plans to have cookies - continue checking sugars at different times of the day - check 3 times a day, rotating checks - check before meals for now - advised for yearly eye exams - will need to check BMP at next visit to ensure BUN/Cr stable after increasing Metformin. Due to her increased creatinine, we discussed about contraindications to metformin for a creatinine of 1.4 for women, per guidelines. I explained the risks, which are very small, but present, for lactic acidosis, and these were especially with another medication in the same class (phenformin). The patient understood the risk and she would like to try a lower dose of metformin, acknowledging the risk for acidosis. We will use 1000 mg daily at night. - Return to clinic in 1 mo with sugar log

## 2013-11-22 NOTE — Telephone Encounter (Signed)
Patient is returning a call from [place]

## 2013-11-26 ENCOUNTER — Telehealth: Payer: Self-pay | Admitting: *Deleted

## 2013-11-26 NOTE — Telephone Encounter (Signed)
LM to call back and verify med she is taking for reflux.

## 2013-11-26 NOTE — Telephone Encounter (Signed)
States she is not taking a PPI.  Taking prednisone 10mg  daily.  Will add to her med list.

## 2013-12-07 ENCOUNTER — Telehealth: Payer: Self-pay | Admitting: Internal Medicine

## 2013-12-07 ENCOUNTER — Other Ambulatory Visit: Payer: Self-pay | Admitting: *Deleted

## 2013-12-07 DIAGNOSIS — M25579 Pain in unspecified ankle and joints of unspecified foot: Secondary | ICD-10-CM | POA: Diagnosis not present

## 2013-12-07 DIAGNOSIS — M25549 Pain in joints of unspecified hand: Secondary | ICD-10-CM | POA: Diagnosis not present

## 2013-12-07 DIAGNOSIS — M109 Gout, unspecified: Secondary | ICD-10-CM | POA: Diagnosis not present

## 2013-12-07 MED ORDER — INSULIN PEN NEEDLE 32G X 4 MM MISC: Status: DC

## 2013-12-07 NOTE — Telephone Encounter (Signed)
Patient stated that [facility] did not receive the fax for the Novalog flexpin small thin needles.  Could you resend it

## 2013-12-07 NOTE — Telephone Encounter (Signed)
Done

## 2013-12-23 DIAGNOSIS — L723 Sebaceous cyst: Secondary | ICD-10-CM | POA: Diagnosis not present

## 2014-01-04 ENCOUNTER — Ambulatory Visit (INDEPENDENT_AMBULATORY_CARE_PROVIDER_SITE_OTHER): Payer: Medicare Other | Admitting: Internal Medicine

## 2014-01-04 ENCOUNTER — Other Ambulatory Visit: Payer: Self-pay | Admitting: *Deleted

## 2014-01-04 ENCOUNTER — Encounter: Payer: Self-pay | Admitting: Internal Medicine

## 2014-01-04 VITALS — BP 128/84 | HR 80 | Temp 98.0°F | Resp 12 | Wt 242.0 lb

## 2014-01-04 DIAGNOSIS — N189 Chronic kidney disease, unspecified: Secondary | ICD-10-CM

## 2014-01-04 DIAGNOSIS — I200 Unstable angina: Secondary | ICD-10-CM | POA: Diagnosis not present

## 2014-01-04 DIAGNOSIS — Z23 Encounter for immunization: Secondary | ICD-10-CM

## 2014-01-04 DIAGNOSIS — E1129 Type 2 diabetes mellitus with other diabetic kidney complication: Secondary | ICD-10-CM | POA: Diagnosis not present

## 2014-01-04 DIAGNOSIS — E1122 Type 2 diabetes mellitus with diabetic chronic kidney disease: Secondary | ICD-10-CM

## 2014-01-04 MED ORDER — GABAPENTIN 400 MG PO CAPS: 400.0000 mg | ORAL_CAPSULE | Freq: Two times a day (BID) | ORAL | Status: DC

## 2014-01-04 MED ORDER — INSULIN GLARGINE 100 UNIT/ML ~~LOC~~ SOLN: 35.0000 [IU] | Freq: Two times a day (BID) | SUBCUTANEOUS | Status: DC

## 2014-01-04 NOTE — Patient Instructions (Addendum)
Please stay off Metformin. Stop Glimepiride. Increase Lantus to 35 units 2x a day Continue Novolog as follows: 30 units before breakfast 20 units before lunch 35 units before dinner Continue the sliding scale of NovoLog: - 150- 165: + 1 unit  - 166- 180: + 2 units  - 181- 195: + 3 units  - 196- 210: + 4 units  - 211- 225: + 5 units  - >225: + 6 units   Please return in 3 month with your sugar log.  Please stop at the lab.  KEEP UP THE GREAT WORK WITH YOUR DIET!

## 2014-01-04 NOTE — Progress Notes (Signed)
Patient ID: Brooke Stafford, female   DOB: 1955-09-26, 58 y.o.   MRN: [id number]  HPI: Brooke Stafford is a 58 y.o.-year-old female, returning for f/u for DM2, dx 2006, insulin-dependent since 2012, uncontrolled, with complications (CKD, iCMP, CAD - s/p CABG x 6, 2002, PN). Last visit 59 mo ago.  Last hemoglobin A1c was: Lab Results  Component Value Date   HGBA1C 10.5* 09/27/2013   HGBA1C 10.7* 08/04/2013   HGBA1C 10.2 06/02/2013  She had a steroid inj for gout in 10/19/2013.  Pt is on a regimen of: - Lantus 60 units qhs - bottle - Novolog - pen: 30 units before breakfast 20 units before lunch 35 units before dinner  I advised her to add 5 units of NovoLog if she plans to have cookies - sliding scale of NovoLog - added 11/2013: - 150- 165: + 1 unit  - 166- 180: + 2 units  - 181- 195: + 3 units  - 196- 210: + 4 units  - 211- 225: + 5 units              - >225: + 6 units  She stopped Metformin 1 mo ago (abd.pain).  We stopped Glimepiride 4 mg in am. She restarted it at bedtime since stopping Metformin - continues this today. We stopped Tradjenta 5 mg daily.   Pt checks her sugars 3 a day and they are improved after quitting eating sweets (cookies), despite stopping Metformin: - am: high 100s-low 200s >> 116-170 (if cookies at night: 190-200 in am; if does not take Lantus: 190-200 in am) >> 134-194 - 2h after b'fast: 240-250 >> 197 >> n/c - before lunch: 240-250 >> 144, 173 >> 152-190, 201 x1 - 2h after lunch: 160-170 >> 270 x1 > n/c - before dinner: 140 >> n/c >> 152-198 - 2h after dinner: high 100s-215 >> 151, 159, 343 (cookies) >> n/c - bedtime: 176, 193, 223 (cookies) >> n/c No lows. Lowest sugar was 95; she has hypoglycemia awareness at 100.  Highest sugar was 343 x1 >> 250's.  Pt's meals are: - Breakfast: toast 2 slices - Lunch: sandwich, fruit - Dinner: meat, veggies, 1 starch - Snacks: 2: fruit or sandwich, diet Pepsi, 1/2 banana, PB crackers  - She has CKD, last  BUN/creatinine:  Lab Results  Component Value Date   BUN 27* 11/10/2013   CREATININE 1.47* 11/10/2013  Decreased from 1.8. Not on ACEI.  - last set of lipids: Lab Results  Component Value Date   CHOL 180 08/04/2013   HDL 35* 08/04/2013   LDLCALC 99 08/04/2013   TRIG 230* 08/04/2013   CHOLHDL 5.1 08/04/2013  On Zetia and Lipitor. Takes 1 ASA a day (81 mg) - last eye exam was in 08/2013. No DR reportedly - ?. She has mild glaucoma.  - + numbness and tingling in her feet. She has PN >> takes gabapentin 400 mg 1-2x a day. Needs a refill.  I reviewed pt's medications, allergies, PMH, social hx, family hx and no changes required, except as mentioned above.   ROS: Constitutional: no weight gain, improved fatigue, + hot flushes Eyes: no blurry vision, no xerophthalmia ENT: no sore throat, no nodules palpated in throat, no dysphagia/odynophagia, no hoarseness, + tinnitus, + hypoacusis Cardiovascular: no CP/SOB/no palpitations/leg swelling Respiratory: no cough/SOB/wheezing Gastrointestinal: no N/V/D/C, no heartburn Musculoskeletal: no muscle aches/joint aches Skin:+ rash, + itching Neurological: no tremors/numbness/tingling/dizziness  PE: BP 128/84  Pulse 80  Temp(Src) 98 F (36.7 C) (Oral)  Resp  12  Wt 242 lb (109.77 kg)  SpO2 97% Wt Readings from Last 3 Encounters:  01/04/14 242 lb (109.77 kg)  11/22/13 246 lb (111.585 kg)  11/18/13 243 lb 11.2 oz (110.542 kg)   Constitutional: obese, in NAD Eyes: PERRLA, EOMI, no exophthalmos ENT: moist mucous membranes, no thyromegaly, no cervical lymphadenopathy Cardiovascular: RRR, No MRG, + R ankle swelling - gout Respiratory: CTA B Gastrointestinal: abdomen soft, NT, ND, BS+ Musculoskeletal: no deformities, strength intact in all 4 Skin: moist, warm, no rashes, + hirsutism Neurological: no tremor with outstretched hands, DTR normal in all 4  ASSESSMENT: 1. DM2, insulin-dependent, uncontrolled, with complications - CKD - iCMP, CAD -  s/p CABG x 6, 2002, s/p stent 2006, s/p stent 09/2013 - Dr. Sallyanne Kuster - PN  PLAN:  1. Patient with long-standing, uncontrolled diabetes, on basal-bolus injectable and oral antidiabetic regimen, with improvement in her sugars after stopping eating cookies. - We discussed about options for treatment, and I suggested to split Lantus and stop Amaryl::    Patient Instructions   Please stay off Metformin. Stop Glimepiride. Increase Lantus to 35 units 2x a day Continue Novolog as follows: 30 units before breakfast 20 units before lunch 35 units before dinner Continue the sliding scale of NovoLog: - 150- 165: + 1 unit  - 166- 180: + 2 units  - 181- 195: + 3 units  - 196- 210: + 4 units  - 211- 225: + 5 units  - >225: + 6 units   Please return in 3 month with your sugar log.  Please stop at the lab.  KEEP UP THE GREAT WORK WITH YOUR DIET!   - continue checking sugars at different times of the day - check 3 times a day, rotating checks - check before meals for now - up to date with yearly eye exams - refilled Gabapentin - will give her the flu vaccine - will check HbA1c today - Return to clinic in 3 mo with sugar log   Office Visit on 01/04/2014  Component Date Value Ref Range Status  . Hemoglobin A1C 01/04/2014 8.7* 4.6 - 6.5 % Final   Glycemic Control Guidelines for People with Diabetes:Non Diabetic:  <6%Goal of Therapy: <7%Additional Action Suggested:  >8%    Improved HbA1c!

## 2014-01-05 LAB — HEMOGLOBIN A1C: Hgb A1c MFr Bld: 8.7 % — ABNORMAL HIGH (ref 4.6–6.5)

## 2014-01-19 ENCOUNTER — Telehealth: Payer: Self-pay | Admitting: Internal Medicine

## 2014-01-19 NOTE — Telephone Encounter (Signed)
The rash she had at her visit is not getting better its getting worse please advise

## 2014-01-20 NOTE — Telephone Encounter (Signed)
Please read note below and advise.  

## 2014-01-20 NOTE — Telephone Encounter (Signed)
Please contact PCP

## 2014-01-20 NOTE — Telephone Encounter (Signed)
Called pt and lvm advising pt per Dr Arman Filter note.

## 2014-01-21 ENCOUNTER — Telehealth: Payer: Self-pay | Admitting: Emergency Medicine

## 2014-01-21 ENCOUNTER — Telehealth: Payer: Self-pay | Admitting: Internal Medicine

## 2014-01-21 NOTE — Telephone Encounter (Signed)
Left message for pt to call nurse line to schedule visit for further treatment/evaluation

## 2014-01-21 NOTE — Telephone Encounter (Signed)
Pt is experiencing a rash about 3 on stomach where insulin shots are placed. Also, she has a rash from her A1C on left arm. She also has around 5 or 6 thumb size spot that are dark purple which look like bruises under left arm and 3 spots on her left hip. Also has smaller spots.  When she went to hopsital she had a blood thinner shot on 7/27th and that same day when she got home she saw her reaction but a couple of days later after taking her insulin her rashed became larger and the tiny spots came about. Its not going away and she might think its scoriasis. Please follow up with pt for advise.

## 2014-01-25 ENCOUNTER — Other Ambulatory Visit: Payer: Self-pay | Admitting: Physician Assistant

## 2014-01-25 ENCOUNTER — Other Ambulatory Visit: Payer: Self-pay

## 2014-01-25 ENCOUNTER — Ambulatory Visit: Payer: Medicare Other | Attending: Internal Medicine | Admitting: Internal Medicine

## 2014-01-25 ENCOUNTER — Ambulatory Visit (HOSPITAL_COMMUNITY)
Admission: RE | Admit: 2014-01-25 | Discharge: 2014-01-25 | Disposition: A | Discharge status: Home / Self Care | Payer: Medicare Other | Source: Ambulatory Visit | Attending: Internal Medicine | Admitting: Internal Medicine

## 2014-01-25 ENCOUNTER — Encounter (HOSPITAL_COMMUNITY): Payer: Self-pay | Admitting: Emergency Medicine

## 2014-01-25 ENCOUNTER — Emergency Department (HOSPITAL_COMMUNITY)
Admission: EM | Admit: 2014-01-25 | Discharge: 2014-01-25 | Disposition: A | Discharge status: Home / Self Care | Payer: Medicare Other | Attending: Emergency Medicine | Admitting: Emergency Medicine

## 2014-01-25 ENCOUNTER — Emergency Department (HOSPITAL_COMMUNITY): Payer: Medicare Other

## 2014-01-25 VITALS — BP 171/79 | HR 71 | Temp 98.7°F

## 2014-01-25 DIAGNOSIS — N182 Chronic kidney disease, stage 2 (mild): Secondary | ICD-10-CM | POA: Insufficient documentation

## 2014-01-25 DIAGNOSIS — E669 Obesity, unspecified: Secondary | ICD-10-CM | POA: Diagnosis not present

## 2014-01-25 DIAGNOSIS — I10 Essential (primary) hypertension: Secondary | ICD-10-CM | POA: Diagnosis not present

## 2014-01-25 DIAGNOSIS — M1990 Unspecified osteoarthritis, unspecified site: Secondary | ICD-10-CM | POA: Insufficient documentation

## 2014-01-25 DIAGNOSIS — I129 Hypertensive chronic kidney disease with stage 1 through stage 4 chronic kidney disease, or unspecified chronic kidney disease: Secondary | ICD-10-CM | POA: Diagnosis not present

## 2014-01-25 DIAGNOSIS — I25709 Atherosclerosis of coronary artery bypass graft(s), unspecified, with unspecified angina pectoris: Secondary | ICD-10-CM | POA: Diagnosis not present

## 2014-01-25 DIAGNOSIS — I739 Peripheral vascular disease, unspecified: Secondary | ICD-10-CM

## 2014-01-25 DIAGNOSIS — Z862 Personal history of diseases of the blood and blood-forming organs and certain disorders involving the immune mechanism: Secondary | ICD-10-CM | POA: Insufficient documentation

## 2014-01-25 DIAGNOSIS — Z951 Presence of aortocoronary bypass graft: Secondary | ICD-10-CM | POA: Insufficient documentation

## 2014-01-25 DIAGNOSIS — Z87891 Personal history of nicotine dependence: Secondary | ICD-10-CM | POA: Diagnosis not present

## 2014-01-25 DIAGNOSIS — Z7982 Long term (current) use of aspirin: Secondary | ICD-10-CM | POA: Diagnosis not present

## 2014-01-25 DIAGNOSIS — Z8719 Personal history of other diseases of the digestive system: Secondary | ICD-10-CM | POA: Diagnosis not present

## 2014-01-25 DIAGNOSIS — G43909 Migraine, unspecified, not intractable, without status migrainosus: Secondary | ICD-10-CM | POA: Insufficient documentation

## 2014-01-25 DIAGNOSIS — E785 Hyperlipidemia, unspecified: Secondary | ICD-10-CM | POA: Diagnosis not present

## 2014-01-25 DIAGNOSIS — Z794 Long term (current) use of insulin: Secondary | ICD-10-CM | POA: Diagnosis not present

## 2014-01-25 DIAGNOSIS — Z9861 Coronary angioplasty status: Secondary | ICD-10-CM | POA: Diagnosis not present

## 2014-01-25 DIAGNOSIS — J45909 Unspecified asthma, uncomplicated: Secondary | ICD-10-CM | POA: Diagnosis not present

## 2014-01-25 DIAGNOSIS — Z8701 Personal history of pneumonia (recurrent): Secondary | ICD-10-CM | POA: Diagnosis not present

## 2014-01-25 DIAGNOSIS — Z7951 Long term (current) use of inhaled steroids: Secondary | ICD-10-CM | POA: Insufficient documentation

## 2014-01-25 DIAGNOSIS — I509 Heart failure, unspecified: Secondary | ICD-10-CM | POA: Diagnosis not present

## 2014-01-25 DIAGNOSIS — I252 Old myocardial infarction: Secondary | ICD-10-CM | POA: Insufficient documentation

## 2014-01-25 DIAGNOSIS — E1169 Type 2 diabetes mellitus with other specified complication: Secondary | ICD-10-CM

## 2014-01-25 DIAGNOSIS — Z79899 Other long term (current) drug therapy: Secondary | ICD-10-CM | POA: Insufficient documentation

## 2014-01-25 DIAGNOSIS — I4581 Long QT syndrome: Secondary | ICD-10-CM

## 2014-01-25 DIAGNOSIS — F329 Major depressive disorder, single episode, unspecified: Secondary | ICD-10-CM | POA: Insufficient documentation

## 2014-01-25 DIAGNOSIS — E039 Hypothyroidism, unspecified: Secondary | ICD-10-CM | POA: Insufficient documentation

## 2014-01-25 DIAGNOSIS — Z9889 Other specified postprocedural states: Secondary | ICD-10-CM | POA: Insufficient documentation

## 2014-01-25 DIAGNOSIS — F419 Anxiety disorder, unspecified: Secondary | ICD-10-CM | POA: Diagnosis not present

## 2014-01-25 DIAGNOSIS — E119 Type 2 diabetes mellitus without complications: Secondary | ICD-10-CM | POA: Insufficient documentation

## 2014-01-25 DIAGNOSIS — R079 Chest pain, unspecified: Secondary | ICD-10-CM | POA: Diagnosis not present

## 2014-01-25 DIAGNOSIS — E1122 Type 2 diabetes mellitus with diabetic chronic kidney disease: Secondary | ICD-10-CM

## 2014-01-25 DIAGNOSIS — I2510 Atherosclerotic heart disease of native coronary artery without angina pectoris: Secondary | ICD-10-CM | POA: Insufficient documentation

## 2014-01-25 DIAGNOSIS — Z880 Allergy status to penicillin: Secondary | ICD-10-CM | POA: Insufficient documentation

## 2014-01-25 DIAGNOSIS — Z7902 Long term (current) use of antithrombotics/antiplatelets: Secondary | ICD-10-CM | POA: Insufficient documentation

## 2014-01-25 DIAGNOSIS — N185 Chronic kidney disease, stage 5: Secondary | ICD-10-CM | POA: Diagnosis present

## 2014-01-25 DIAGNOSIS — M109 Gout, unspecified: Secondary | ICD-10-CM | POA: Insufficient documentation

## 2014-01-25 DIAGNOSIS — R0789 Other chest pain: Secondary | ICD-10-CM | POA: Diagnosis not present

## 2014-01-25 DIAGNOSIS — R0602 Shortness of breath: Secondary | ICD-10-CM | POA: Diagnosis not present

## 2014-01-25 DIAGNOSIS — N189 Chronic kidney disease, unspecified: Secondary | ICD-10-CM

## 2014-01-25 DIAGNOSIS — I517 Cardiomegaly: Secondary | ICD-10-CM | POA: Diagnosis not present

## 2014-01-25 LAB — TROPONIN I
Troponin I: <0.3 ng/mL (ref <0.30)
Troponin I: <0.3 ng/mL (ref <0.30)

## 2014-01-25 LAB — CBC
HCT: 38.2 % (ref 36.0–46.0)
Hemoglobin: 13.7 g/dL (ref 12.0–15.0)
MCH: 32.1 pg (ref 26.0–34.0)
MCHC: 35.9 g/dL (ref 30.0–36.0)
MCV: 89.5 fL (ref 78.0–100.0)
PLATELETS: 172 10*3/uL (ref 150–400)
RBC: 4.27 MIL/uL (ref 3.87–5.11)
RDW: 13.6 % (ref 11.5–15.5)
WBC: 5.6 10*3/uL (ref 4.0–10.5)

## 2014-01-25 LAB — PROTIME-INR
INR: 0.89 (ref 0.00–1.49)
Prothrombin Time: 12.1 seconds (ref 11.6–15.2)

## 2014-01-25 LAB — BASIC METABOLIC PANEL
ANION GAP: 9 (ref 5–15)
BUN: 19 mg/dL (ref 6–23)
CALCIUM: 10.3 mg/dL (ref 8.4–10.5)
CO2: 28 mEq/L (ref 19–32)
CREATININE: 1.38 mg/dL — AB (ref 0.50–1.10)
Chloride: 105 mEq/L (ref 96–112)
GFR calc Af Amer: 47 mL/min — ABNORMAL LOW (ref >90)
GFR, EST NON AFRICAN AMERICAN: 41 mL/min — AB (ref >90)
Glucose, Bld: 145 mg/dL — ABNORMAL HIGH (ref 70–99)
Potassium: 4.2 mEq/L (ref 3.7–5.3)
SODIUM: 142 meq/L (ref 137–147)

## 2014-01-25 LAB — PRO B NATRIURETIC PEPTIDE: Pro B Natriuretic peptide (BNP): 1369 pg/mL — ABNORMAL HIGH (ref 0–125)

## 2014-01-25 MED ORDER — METOPROLOL SUCCINATE ER 50 MG PO TB24
150.0000 mg | ORAL_TABLET | Freq: Every day | ORAL | Status: DC
Start: 2014-01-25 — End: 2015-07-28

## 2014-01-25 MED ORDER — FUROSEMIDE 10 MG/ML IJ SOLN
20.0000 mg | Freq: Once | INTRAMUSCULAR | Status: AC
  Administered 2014-01-25: 20 mg via INTRAVENOUS
  Filled 2014-01-25: qty 2

## 2014-01-25 MED ORDER — ISOSORBIDE MONONITRATE ER 60 MG PO TB24: 90.0000 mg | ORAL_TABLET | Freq: Every day | ORAL | Status: DC

## 2014-01-25 NOTE — Progress Notes (Signed)
Patient presents as walk-in for rash States spots left abdomen, both arms, both feet began after hospitalization in July 2015 States they itch and feels "stinging" sensation Has appt with Dermatologist 02/15/14 States also feeling weak and diaphoretic for 2-3 months   States blood sugars have been ranging 94-205  States she had surgery on right ear on 12/23/13 and has neck and back soreness since  States started wheezing 2 days ago relieved with albuterol inhaler States has had chest soreness for 4 days; rates 3/10 at present  BP 171/79 P 71 T 98.7 or SPO2 97%  CBG 158  EKG done and reviewed by provider Patient to be sent to ED for chest pain and ECG changes per provider.   Report called to [facility] ED charge nurse

## 2014-01-25 NOTE — ED Provider Notes (Signed)
Medical screening examination/treatment/procedure(s) were performed by non-physician practitioner and as supervising physician I was immediately available for consultation/collaboration.   EKG Interpretation None        Malvin Johns, MD 01/25/14 2302

## 2014-01-25 NOTE — ED Notes (Signed)
Pt reports chest pain has improved.

## 2014-01-25 NOTE — ED Notes (Signed)
Pt presents with onset of mid-sternal chest pain that began last night.  +shortness of breath, pt describes as indigestion or choking.

## 2014-01-25 NOTE — ED Notes (Signed)
MD at bedside. 

## 2014-01-25 NOTE — ED Provider Notes (Signed)
Discussed case with Lenna Gilford, PA-C. Transfer of care from [facility], PA-C at change in shift.   Plan: Cardiology to assess.   6:39 PM Attending physician, Dr. Roxine Caddy, spoke with Dr. Marlou Porch from Cardiology. Reported that second troponin will be ordered and if second troponin is negative patient can be discharged home. Home medications were increased by Cardiologist.   Brooke Stafford is a 58 y/o F with PMhx of asthma, anxiety, GERD, coronary artery disease, hypertension, anemia, CHF, MI, diabetes, migraines, headache, gout, anginal pain, cardiac cath back in 2002, stent placement 2004 and 2012-2 stents total presenting to emergency department with chest pain does been ongoing for the past 2-3 weeks localized left-sided chest described as a pressure sensation. Patient is followed by Dr. Recardo Evangelist as an outpatient. Denied weakness, numbness, tingling, difficulty breathing. PCP Dr. Doreene Burke   Results for orders placed during the hospital encounter of 01/25/14  CBC      Result Value Ref Range   WBC 5.6  4.0 - 10.5 K/uL   RBC 4.27  3.87 - 5.11 MIL/uL   Hemoglobin 13.7  12.0 - 15.0 g/dL   HCT 38.2  36.0 - 46.0 %   MCV 89.5  78.0 - 100.0 fL   MCH 32.1  26.0 - 34.0 pg   MCHC 35.9  30.0 - 36.0 g/dL   RDW 13.6  11.5 - 15.5 %   Platelets 172  150 - 400 K/uL  BASIC METABOLIC PANEL      Result Value Ref Range   Sodium 142  137 - 147 mEq/L   Potassium 4.2  3.7 - 5.3 mEq/L   Chloride 105  96 - 112 mEq/L   CO2 28  19 - 32 mEq/L   Glucose, Bld 145 (*) 70 - 99 mg/dL   BUN 19  6 - 23 mg/dL   Creatinine, Ser 1.38 (*) 0.50 - 1.10 mg/dL   Calcium 10.3  8.4 - 10.5 mg/dL   GFR calc non Af Amer 41 (*) >90 mL/min   GFR calc Af Amer 47 (*) >90 mL/min   Anion gap 9  5 - 15  PRO B NATRIURETIC PEPTIDE      Result Value Ref Range   Pro B Natriuretic peptide (BNP) 1369.0 (*) 0 - 125 pg/mL  TROPONIN I      Result Value Ref Range   Troponin I <0.30  <0.30 ng/mL  PROTIME-INR      Result Value Ref Range    Prothrombin Time 12.1  11.6 - 15.2 seconds   INR 0.89  0.00 - 1.49  TROPONIN I      Result Value Ref Range   Troponin I <0.30  <0.30 ng/mL   Dg Chest 2 View  01/25/2014   CLINICAL DATA:  Mid chest pain with shortness of breath and dizziness.  EXAM: CHEST  2 VIEW  COMPARISON:  11/08/2013 and 08/03/2013  FINDINGS: There is new slight cardiomegaly. CABG. Scarring in the left midzone, stable. Lungs are otherwise clear. No effusions. No significant osseous abnormality.  IMPRESSION: New cardiomegaly.  No other change.   Electronically Signed   By: Rozetta Nunnery M.D.   On: 01/25/2014 15:37   8:26 PM Patient reported that she is feeling better-denied chest pain.  Medications  furosemide (LASIX) injection 20 mg (20 mg Intravenous Given 01/25/14 1542)   Filed Vitals:   01/25/14 1415 01/25/14 1600 01/25/14 1700 01/25/14 1836  BP: 149/71 151/72 140/67 166/93  Pulse: 63 61 64   Temp:  TempSrc:      Resp: 17 11 15 18   Height:      Weight:      SpO2: 99% 98% 98%     Second troponin negative elevation. Patient seen and assessed by cardiology while in ED setting, as per cardiologist reported that if patient second troponin was not elevated patient to be discharged home. Changes to medications were made by cardiologist. As per note, cardiology agreed to discharge. Patient stable, afebrile. Patient not septic appearing. Discharged patient. Referred patient to PCP and cardiology. Discussed with patient to take medications as prescribed her cardiologist. Discussed with patient to closely monitor symptoms and if symptoms are to worsen or change to report back to the ED - strict return instructions given.  Patient agreed to plan of care, understood, all questions answered.   Jamse Mead, PA-C 01/25/14 2030

## 2014-01-25 NOTE — Discharge Instructions (Signed)
Please call your doctor for a followup appointment within 24-48 hours. When you talk to your doctor please let them know that you were seen in the emergency department and have them acquire all of your records so that they can discuss the findings with you and formulate a treatment plan to fully care for your new and ongoing problems. Please call cardiologist tomorrow and set up an appointment Please take medications as prescribed by cardiologist today Please rest and stay hydrated-please drink plenty of water Please avoid foods that are high in fat and grease, spicy foods, alcohol Please continue to monitor symptoms closely and if symptoms are to worsen or change (fever greater than 101, chills, sweating, nausea, vomiting, chest pain, shortness of breathe, difficulty breathing, weakness, numbness, tingling, worsening or changes to pain pattern, fainting, dizziness, headache, visual changes) please report back to the Emergency Department immediately.   Chest Pain (Nonspecific) It is often hard to give a specific diagnosis for the cause of chest pain. There is always a chance that your pain could be related to something serious, such as a heart attack or a blood clot in the lungs. You need to follow up with your health care provider for further evaluation. CAUSES   Heartburn.  Pneumonia or bronchitis.  Anxiety or stress.  Inflammation around your heart (pericarditis) or lung (pleuritis or pleurisy).  A blood clot in the lung.  A collapsed lung (pneumothorax). It can develop suddenly on its own (spontaneous pneumothorax) or from trauma to the chest.  Shingles infection (herpes zoster virus). The chest wall is composed of bones, muscles, and cartilage. Any of these can be the source of the pain.  The bones can be bruised by injury.  The muscles or cartilage can be strained by coughing or overwork.  The cartilage can be affected by inflammation and become sore (costochondritis). DIAGNOSIS    Lab tests or other studies may be needed to find the cause of your pain. Your health care provider may have you take a test called an ambulatory electrocardiogram (ECG). An ECG records your heartbeat patterns over a 24-hour period. You may also have other tests, such as:  Transthoracic echocardiogram (TTE). During echocardiography, sound waves are used to evaluate how blood flows through your heart.  Transesophageal echocardiogram (TEE).  Cardiac monitoring. This allows your health care provider to monitor your heart rate and rhythm in real time.  Holter monitor. This is a portable device that records your heartbeat and can help diagnose heart arrhythmias. It allows your health care provider to track your heart activity for several days, if needed.  Stress tests by exercise or by giving medicine that makes the heart beat faster. TREATMENT   Treatment depends on what may be causing your chest pain. Treatment may include:  Acid blockers for heartburn.  Anti-inflammatory medicine.  Pain medicine for inflammatory conditions.  Antibiotics if an infection is present.  You may be advised to change lifestyle habits. This includes stopping smoking and avoiding alcohol, caffeine, and chocolate.  You may be advised to keep your head raised (elevated) when sleeping. This reduces the chance of acid going backward from your stomach into your esophagus. Most of the time, nonspecific chest pain will improve within 2-3 days with rest and mild pain medicine.  HOME CARE INSTRUCTIONS   If antibiotics were prescribed, take them as directed. Finish them even if you start to feel better.  For the next few days, avoid physical activities that bring on chest pain. Continue physical activities  as directed.  Do not use any tobacco products, including cigarettes, chewing tobacco, or electronic cigarettes.  Avoid drinking alcohol.  Only take medicine as directed by your health care provider.  Follow your  health care provider's suggestions for further testing if your chest pain does not go away.  Keep any follow-up appointments you made. If you do not go to an appointment, you could develop lasting (chronic) problems with pain. If there is any problem keeping an appointment, call to reschedule. SEEK MEDICAL CARE IF:   Your chest pain does not go away, even after treatment.  You have a rash with blisters on your chest.  You have a fever. SEEK IMMEDIATE MEDICAL CARE IF:   You have increased chest pain or pain that spreads to your arm, neck, jaw, back, or abdomen.  You have shortness of breath.  You have an increasing cough, or you cough up blood.  You have severe back or abdominal pain.  You feel nauseous or vomit.  You have severe weakness.  You faint.  You have chills. This is an emergency. Do not wait to see if the pain will go away. Get medical help at once. Call your local emergency services (911 in [place] Do not drive yourself to the hospital. MAKE SURE YOU:   Understand these instructions.  Will watch your condition.  Will get help right away if you are not doing well or get worse. Document Released: 01/09/2005 Document Revised: 04/06/2013 Document Reviewed: 11/05/2007 [facility] Patient Information 2015 [place], [place]. This information is not intended to replace advice given to you by your health care provider. Make sure you discuss any questions you have with your health care provider.

## 2014-01-25 NOTE — ED Provider Notes (Signed)
CSN: [id number]     Arrival date & time 01/25/14  1257 History   First MD Initiated Contact with Patient 01/25/14 1424     No chief complaint on file.    (Consider location/radiation/quality/duration/timing/severity/associated sxs/prior Treatment) HPI Brooke Stafford is a 58 y.o. female with extensive cardiac history, hypertension, diabetes, hypercholesterolemia comes in for evaluation of chest pain. Patient states she was on her way to the [place] [facility] this morning for evaluation of a rash which she began to have an indigestion type pain around 8:00 this morning. She reports taking antacids which relieved the pain but it did not fully resolve until she stopped moving. She admits she does not take her Lasix every day as directed. She also skips her aspirin therapy when her gout bothers her, but reports she has been taking it appropriately recently. Cardiologist is Dr. Recardo Evangelist  Past Medical History  Diagnosis Date  . Asthma   . Anxiety   . GERD (gastroesophageal reflux disease)   . Coronary artery disease 2002    CABG x 6. Cath 5/11- med Rx  . Hypertension   . Peripheral vascular disease 12/12    LSFA PTA  . Anemia   . Hyperlipidemia   . Chronic renal insufficiency, stage II (mild)     followed  by [facility]  . CAD (coronary artery disease) 2002; 2015    CABG x 6 2002, cath 2011- med Rx stent DES VG-Diag  . Hypothyroid     treated  . Obesity (BMI 35.0-39.9 without comorbidity)   . CHF (congestive heart failure)     "in 2002" (11/26/2012)  . Myocardial infarction 2000; 2002; 2011?  [name] Chronic bronchitis     "q year; in the winter" (08/04/2013)  . Type II diabetes mellitus   . Depression   . Pneumonia     "3 times I think" (08/04/2013)  . History of blood transfusion 2002    "when I had OHS"  . Migraines     "couple times/year" (08/04/2013)  . Headache     "~ q week" (08/04/2013)  . Arthritis     "stiff fingers and knees" (08/04/2013)  . Gout     "right big  toe"  . Anginal pain   . CAD (coronary artery disease) of artery bypass graft; DES to VG-Diag 09/28/13 11/09/2013   Past Surgical History  Procedure Laterality Date  . Cholecystectomy  1982  . Cesarean section  1978; 1980  . Tubal ligation  1980  . Breast cyst excision Right 1970's  . Coronary artery bypass graft  11/20/2000    x6 LIMA to distal LAD, svg to first diag, svg to ramus intermediate branch and swquential SVG to cir marginal branch, SVG to posterior descending coronary and sequential SVG to first right posterolateral branch  . Cardiac catheterization  2002  . Coronary angioplasty with stent placement  2004; 2012    "I have 2 stents" (08/04/2013)  . Peripheral arterial stent graft Left     SFA/notes 04/07/2011 (11/30/2012)  . Nm myocar perf wall motion  08/27/2004    negative  . Appendectomy  1980  . Coronary angioplasty with stent placement  09/28/13    PTCA/ DES Xience stent to VG-Diag    Family History  Problem Relation Age of Onset  . Diabetes Mother   . Hypertension Mother   . Stroke Mother   . Hypertension Father   . Hypertension Brother   . Hypertension Sister   . Diabetes Sister   .  Hyperlipidemia Sister    History  Substance Use Topics  . Smoking status: Former Smoker -- 1.00 packs/day for 25 years    Types: Cigarettes    Quit date: 04/04/2000  . Smokeless tobacco: Never Used  . Alcohol Use: Yes     Comment: 08/04/2013  "have a glass of red wine on my birthday q yr; that's it"   OB History   Grav Para Term Preterm Abortions TAB SAB Ect Mult Living                 Review of Systems  Constitutional: Negative for fever.  HENT: Negative for sore throat.   Eyes: Negative for visual disturbance.  Respiratory: Negative for shortness of breath.   Cardiovascular: Positive for chest pain.  Gastrointestinal: Negative for abdominal pain.  Endocrine: Negative for polyuria.  Genitourinary: Negative for dysuria.  Skin: Negative for rash.  Neurological: Negative  for headaches.      Allergies  Hydralazine; Penicillins cross reactors; and Adhesive  Home Medications   Prior to Admission medications   Medication Sig Start Date End Date Taking? Authorizing Provider  albuterol (PROVENTIL HFA;VENTOLIN HFA) 108 (90 BASE) MCG/ACT inhaler Inhale 2 puffs into the lungs every 6 (six) hours as needed for wheezing or shortness of breath.   Yes Historical Provider, MD  allopurinol (ZYLOPRIM) 300 MG tablet Take 300 mg by mouth daily.    Yes Historical Provider, MD  amLODipine (NORVASC) 10 MG tablet Take 1 tablet (10 mg total) by mouth daily. 06/02/13  Yes Reyne Dumas, MD  aspirin 81 MG chewable tablet Chew 1 tablet (81 mg total) by mouth daily. 09/29/13  Yes Brittainy Erie Noe, PA-C  atorvastatin (LIPITOR) 40 MG tablet Take 1 tablet (40 mg total) by mouth daily. 06/02/13  Yes Reyne Dumas, MD  cloNIDine (CATAPRES) 0.2 MG tablet Take 1 tablet (0.2 mg total) by mouth 2 (two) times daily. 11/09/13  Yes Brett Canales, PA-C  clopidogrel (PLAVIX) 75 MG tablet Take 1 tablet (75 mg total) by mouth daily. 06/02/13  Yes Reyne Dumas, MD  cyclobenzaprine (FLEXERIL) 10 MG tablet Take 1 tablet (10 mg total) by mouth 3 (three) times daily as needed for muscle spasms. 11/09/13  Yes Isaiah Serge, NP  ezetimibe (ZETIA) 10 MG tablet Take 1 tablet (10 mg total) by mouth daily. 08/30/13  Yes Brittainy Erie Noe, PA-C  fluticasone (FLONASE) 50 MCG/ACT nasal spray Place 2 sprays into both nostrils daily as needed for allergies or rhinitis.   Yes Historical Provider, MD  gabapentin (NEURONTIN) 400 MG capsule Take 1 capsule (400 mg total) by mouth 2 (two) times daily. 01/04/14  Yes Philemon Kingdom, MD  insulin aspart (NOVOLOG FLEXPEN) 100 UNIT/ML FlexPen Inject 20-35 Units into the skin 3 (three) times daily with meals. Inject 30 units subq at breakfast, inject 20 units subq at lunch and inject 35 units subq at supper.   Yes Historical Provider, MD  insulin glargine (LANTUS) 100 UNIT/ML  injection Inject 0.35 mLs (35 Units total) into the skin 2 (two) times daily. 01/04/14  Yes Philemon Kingdom, MD  isosorbide mononitrate (IMDUR) 60 MG 24 hr tablet Take 1 tablet (60 mg total) by mouth daily. 11/02/13  Yes Brittainy Erie Noe, PA-C  levothyroxine (SYNTHROID, LEVOTHROID) 25 MCG tablet Take 1 tablet (25 mcg total) by mouth daily before breakfast. 09/21/13  Yes Deepak Advani, MD  loratadine (CLARITIN) 10 MG tablet Take 1 tablet (10 mg total) by mouth daily. 09/21/13  Yes Lorayne Marek, MD  Menthol-Camphor (  TIGER BALM ARTHRITIS RUB) 11-11 % CREA Apply 1 application topically 2 (two) times daily.   Yes Historical Provider, MD  metoprolol succinate (TOPROL-XL) 100 MG 24 hr tablet Take 1 tablet (100 mg total) by mouth daily. Take with or immediately following a meal. 09/24/13  Yes Brittainy Erie Noe, PA-C  nitroGLYCERIN (NITROSTAT) 0.4 MG SL tablet Place 1 tablet (0.4 mg total) under the tongue every 5 (five) minutes as needed. For chest 08/30/13  Yes Brittainy Erie Noe, PA-C  polyethylene glycol (MIRALAX / GLYCOLAX) packet Take 17 g by mouth daily as needed for moderate constipation.   Yes Historical Provider, MD  traZODone (DESYREL) 50 MG tablet Take 25 mg by mouth at bedtime.   Yes Historical Provider, MD  Insulin Pen Needle 32G X 4 MM MISC Use to inject insulin daily as instructed. 12/07/13   Philemon Kingdom, MD  Insulin Syringes, Disposable, (B-D INSULIN SYRINGE 1CC) U-100 1 ML MISC 1 each by Does not apply route See admin instructions. Use with insulin daily.    Historical Provider, MD   BP 149/71  Pulse 63  Temp(Src) 98.6 F (37 C) (Oral)  Resp 17  Ht 5\' 11"  (1.803 m)  Wt 244 lb (110.678 kg)  BMI 34.05 kg/m2  SpO2 99% Physical Exam  Nursing note and vitals reviewed. Constitutional: She is oriented to person, place, and time. She appears well-developed and well-nourished.  HENT:  Head: Normocephalic and atraumatic.  Mouth/Throat: Oropharynx is clear and moist.  Eyes: Conjunctivae  are normal. Pupils are equal, round, and reactive to light. Right eye exhibits no discharge. Left eye exhibits no discharge. No scleral icterus.  Neck: Neck supple.  Cardiovascular: Normal rate, regular rhythm and normal heart sounds.   Pulmonary/Chest: Effort normal and breath sounds normal. No respiratory distress. She has no wheezes. She has no rales.  Abdominal: Soft. There is no tenderness.  Musculoskeletal: She exhibits no tenderness.  Neurological: She is alert and oriented to person, place, and time.  Cranial Nerves II-XII grossly intact  Skin: Skin is warm and dry. No rash noted.  Psychiatric: She has a normal mood and affect.    ED Course  Procedures (including critical care time) Labs Review Labs Reviewed  BASIC METABOLIC PANEL - Abnormal; Notable for the following:    Glucose, Bld 145 (*)    Creatinine, Ser 1.38 (*)    GFR calc non Af Amer 41 (*)    GFR calc Af Amer 47 (*)    All other components within normal limits  PRO B NATRIURETIC PEPTIDE - Abnormal; Notable for the following:    Pro B Natriuretic peptide (BNP) 1369.0 (*)    All other components within normal limits  CBC  TROPONIN I  PROTIME-INR    Imaging Review Dg Chest 2 View  01/25/2014   CLINICAL DATA:  Mid chest pain with shortness of breath and dizziness.  EXAM: CHEST  2 VIEW  COMPARISON:  11/08/2013 and 08/03/2013  FINDINGS: There is new slight cardiomegaly. CABG. Scarring in the left midzone, stable. Lungs are otherwise clear. No effusions. No significant osseous abnormality.  IMPRESSION: New cardiomegaly.  No other change.   Electronically Signed   By: Rozetta Nunnery M.D.   On: 01/25/2014 15:37     EKG Interpretation None      MDM  Vitals stable  -afebrile Pt resting comfortably in ED. patient is asymptomatic in ED, without chest pain. Patient heart score 5, EKG shows new T-wave inversions.  Labwork shows a BNP of 1369,  troponin negative Imaging-- CXR shows new cardiomegaly. PE not concerning  for other acute or emergent pathologies. Discussed with patient course of stay in ED and admission for observation, pt very amenable to plan. Cardiologist is Dr. Loletha Grayer. Cardiology will come see in ED  Prior to patient admission, I discussed and reviewed this case with Dr. Vanita Panda    Final diagnoses:  Chest pain, unspecified chest pain type        Verl Dicker, PA-C 01/28/14 [phone]

## 2014-01-25 NOTE — H&P (Signed)
Brooke Stafford is an 58 y.o. female.   Chief Complaint:  Chest Pain HPI:  The patient is a 58 yo female with a past medical history significant for severe type 2 diabetes mellitus requiring insulin therapy, systemic hypertension, hyperlipidemia, obesity, PVD S/P LSFA PTA initally in 2012 and again in 2014, coronary artery disease, S/P CABG x 6 in 2002.  She had a cardiac catheterization was done in July 2011 when she presented with sepsis and medication non compliance and had a NSTEMI from demand ischemia. Cath then showed a patent LIMA to LAD, patent SVG to diagonal, and patent sequential SVG to branches of the right coronary artery. The plan was for medical Rx. She also has a mild ischemic cardiomyopathy with a left ventricular ejection fraction of 45% to 50% by echo 5/11. Stent to the SVG to diagonal artery performed June 2015 for crescendo angina (3.5 x 23 mm Xience Alpine)   The patient was hospitalized in June 2015 for crescendo angina. Cardiac catheterization showed a patent but severely stenotic saphenous vein graft to the diagonal artery, as was chronic occlusion of the saphenous vein graft to the ramus intermedius (LIMA to the LAD patent, SVG to PDA patent). She underwent stenting of the SVG to the diagonal artery.   Returned in late July with atypical chest discomfort and pain in her left wrist and arm (she had a left radial catheterization for her stent procedure in June). Her blood pressure was also severely elevated - probably due to clonidine rebound. Ultrasonography showed no evidence of arterial injury/pseudoaneurysm in the left upper extremity. Her chest pain was felt to be atypical. Cardiac enzymes were normal. Cervical spine x-rays show degenerative disease without acute abnormalities. She was discharged from the hospital in July 28.   She presented today for a rash on her abdomen and mention having CP off and on for two weeks.  She had an episode last night.  Took and antacid and  the pain resolved in an hour.  She thinks it may have been indigestion.  She felt like she was "choking".  The pain seems to get worse with big inspirations. Described as Squeezing, heavy.  5/10.  Slight SOB.  She sleeps on two pillows.   Medications Prior to Admission medications   Medication Sig Start Date End Date Taking? Authorizing Provider  albuterol (PROVENTIL HFA;VENTOLIN HFA) 108 (90 BASE) MCG/ACT inhaler Inhale 2 puffs into the lungs every 6 (six) hours as needed for wheezing or shortness of breath.   Yes Historical Provider, MD  allopurinol (ZYLOPRIM) 300 MG tablet Take 300 mg by mouth daily.    Yes Historical Provider, MD  amLODipine (NORVASC) 10 MG tablet Take 1 tablet (10 mg total) by mouth daily. 06/02/13  Yes Reyne Dumas, MD  aspirin 81 MG chewable tablet Chew 1 tablet (81 mg total) by mouth daily. 09/29/13  Yes Brittainy Erie Noe, PA-C  atorvastatin (LIPITOR) 40 MG tablet Take 1 tablet (40 mg total) by mouth daily. 06/02/13  Yes Reyne Dumas, MD  cloNIDine (CATAPRES) 0.2 MG tablet Take 1 tablet (0.2 mg total) by mouth 2 (two) times daily. 11/09/13  Yes Brett Canales, PA-C  clopidogrel (PLAVIX) 75 MG tablet Take 1 tablet (75 mg total) by mouth daily. 06/02/13  Yes Reyne Dumas, MD  cyclobenzaprine (FLEXERIL) 10 MG tablet Take 1 tablet (10 mg total) by mouth 3 (three) times daily as needed for muscle spasms. 11/09/13  Yes Isaiah Serge, NP  ezetimibe (ZETIA) 10  MG tablet Take 1 tablet (10 mg total) by mouth daily. 08/30/13  Yes Brittainy Erie Noe, PA-C  fluticasone (FLONASE) 50 MCG/ACT nasal spray Place 2 sprays into both nostrils daily as needed for allergies or rhinitis.   Yes Historical Provider, MD  gabapentin (NEURONTIN) 400 MG capsule Take 1 capsule (400 mg total) by mouth 2 (two) times daily. 01/04/14  Yes Philemon Kingdom, MD  insulin aspart (NOVOLOG FLEXPEN) 100 UNIT/ML FlexPen Inject 20-35 Units into the skin 3 (three) times daily with meals. Inject 30 units subq at breakfast,  inject 20 units subq at lunch and inject 35 units subq at supper.   Yes Historical Provider, MD  insulin glargine (LANTUS) 100 UNIT/ML injection Inject 0.35 mLs (35 Units total) into the skin 2 (two) times daily. 01/04/14  Yes Philemon Kingdom, MD  isosorbide mononitrate (IMDUR) 60 MG 24 hr tablet Take 1 tablet (60 mg total) by mouth daily. 11/02/13  Yes Brittainy Erie Noe, PA-C  levothyroxine (SYNTHROID, LEVOTHROID) 25 MCG tablet Take 1 tablet (25 mcg total) by mouth daily before breakfast. 09/21/13  Yes Deepak Advani, MD  loratadine (CLARITIN) 10 MG tablet Take 1 tablet (10 mg total) by mouth daily. 09/21/13  Yes Lorayne Marek, MD  Menthol-Camphor (TIGER BALM ARTHRITIS RUB) 11-11 % CREA Apply 1 application topically 2 (two) times daily.   Yes Historical Provider, MD  metoprolol succinate (TOPROL-XL) 100 MG 24 hr tablet Take 1 tablet (100 mg total) by mouth daily. Take with or immediately following a meal. 09/24/13  Yes Brittainy Erie Noe, PA-C  nitroGLYCERIN (NITROSTAT) 0.4 MG SL tablet Place 1 tablet (0.4 mg total) under the tongue every 5 (five) minutes as needed. For chest 08/30/13  Yes Brittainy Erie Noe, PA-C  polyethylene glycol (MIRALAX / GLYCOLAX) packet Take 17 g by mouth daily as needed for moderate constipation.   Yes Historical Provider, MD  traZODone (DESYREL) 50 MG tablet Take 25 mg by mouth at bedtime.   Yes Historical Provider, MD  Insulin Pen Needle 32G X 4 MM MISC Use to inject insulin daily as instructed. 12/07/13   Philemon Kingdom, MD  Insulin Syringes, Disposable, (B-D INSULIN SYRINGE 1CC) U-100 1 ML MISC 1 each by Does not apply route See admin instructions. Use with insulin daily.    Historical Provider, MD     Past Medical History  Diagnosis Date  . Asthma   . Anxiety   . GERD (gastroesophageal reflux disease)   . Coronary artery disease 2002    CABG x 6. Cath 5/11- med Rx  . Hypertension   . Peripheral vascular disease 12/12    LSFA PTA  . Anemia   . Hyperlipidemia     . Chronic renal insufficiency, stage II (mild)     followed  by [facility]  . CAD (coronary artery disease) 2002; 2015    CABG x 6 2002, cath 2011- med Rx stent DES VG-Diag  . Hypothyroid     treated  . Obesity (BMI 35.0-39.9 without comorbidity)   . CHF (congestive heart failure)     "in 2002" (11/26/2012)  . Myocardial infarction 2000; 2002; 2011?  [name] Chronic bronchitis     "q year; in the winter" (08/04/2013)  . Type II diabetes mellitus   . Depression   . Pneumonia     "3 times I think" (08/04/2013)  . History of blood transfusion 2002    "when I had OHS"  . Migraines     "couple times/year" (08/04/2013)  . Headache     "~  q week" (08/04/2013)  . Arthritis     "stiff fingers and knees" (08/04/2013)  . Gout     "right big toe"  . Anginal pain   . CAD (coronary artery disease) of artery bypass graft; DES to VG-Diag 09/28/13 11/09/2013    Past Surgical History  Procedure Laterality Date  . Cholecystectomy  1982  . Cesarean section  1978; 1980  . Tubal ligation  1980  . Breast cyst excision Right 1970's  . Coronary artery bypass graft  11/20/2000    x6 LIMA to distal LAD, svg to first diag, svg to ramus intermediate branch and swquential SVG to cir marginal branch, SVG to posterior descending coronary and sequential SVG to first right posterolateral branch  . Cardiac catheterization  2002  . Coronary angioplasty with stent placement  2004; 2012    "I have 2 stents" (08/04/2013)  . Peripheral arterial stent graft Left     SFA/notes 04/07/2011 (11/30/2012)  . Nm myocar perf wall motion  08/27/2004    negative  . Appendectomy  1980  . Coronary angioplasty with stent placement  09/28/13    PTCA/ DES Xience stent to VG-Diag     Family History  Problem Relation Age of Onset  . Diabetes Mother   . Hypertension Mother   . Stroke Mother   . Hypertension Father   . Hypertension Brother   . Hypertension Sister   . Diabetes Sister   . Hyperlipidemia Sister    Social History:   reports that she quit smoking about 13 years ago. Her smoking use included Cigarettes. She has a 25 pack-year smoking history. She has never used smokeless tobacco. She reports that she drinks alcohol. She reports that she does not use illicit drugs.  Allergies:  Allergies  Allergen Reactions  . Hydralazine Shortness Of Breath  . Penicillins Cross Reactors Hives    And high fever  . Adhesive [Tape] Rash    bruising     (Not in a hospital admission)  Results for orders placed during the hospital encounter of 01/25/14 (from the past 48 hour(s))  CBC     Status: None   Collection Time    01/25/14  1:09 PM      Result Value Ref Range   WBC 5.6  4.0 - 10.5 K/uL   RBC 4.27  3.87 - 5.11 MIL/uL   Hemoglobin 13.7  12.0 - 15.0 g/dL   HCT 38.2  36.0 - 46.0 %   MCV 89.5  78.0 - 100.0 fL   MCH 32.1  26.0 - 34.0 pg   MCHC 35.9  30.0 - 36.0 g/dL   RDW 13.6  11.5 - 15.5 %   Platelets 172  150 - 400 K/uL  BASIC METABOLIC PANEL     Status: Abnormal   Collection Time    01/25/14  1:09 PM      Result Value Ref Range   Sodium 142  137 - 147 mEq/L   Potassium 4.2  3.7 - 5.3 mEq/L   Chloride 105  96 - 112 mEq/L   CO2 28  19 - 32 mEq/L   Glucose, Bld 145 (*) 70 - 99 mg/dL   BUN 19  6 - 23 mg/dL   Creatinine, Ser 1.38 (*) 0.50 - 1.10 mg/dL   Calcium 10.3  8.4 - 10.5 mg/dL   GFR calc non Af Amer 41 (*) >90 mL/min   GFR calc Af Amer 47 (*) >90 mL/min   Comment: (NOTE)  The eGFR has been calculated using the CKD EPI equation.     This calculation has not been validated in all clinical situations.     eGFR's persistently <90 mL/min signify possible Chronic Kidney     Disease.   Anion gap 9  5 - 15  PRO B NATRIURETIC PEPTIDE     Status: Abnormal   Collection Time    01/25/14  1:09 PM      Result Value Ref Range   Pro B Natriuretic peptide (BNP) 1369.0 (*) 0 - 125 pg/mL  TROPONIN I     Status: None   Collection Time    01/25/14  1:09 PM      Result Value Ref Range   Troponin I <0.30   <0.30 ng/mL   Comment:            Due to the release kinetics of cTnI,     a negative result within the first hours     of the onset of symptoms does not rule out     myocardial infarction with certainty.     If myocardial infarction is still suspected,     repeat the test at appropriate intervals.  PROTIME-INR     Status: None   Collection Time    01/25/14  1:09 PM      Result Value Ref Range   Prothrombin Time 12.1  11.6 - 15.2 seconds   INR 0.89  0.00 - 1.49   Dg Chest 2 View  01/25/2014   CLINICAL DATA:  Mid chest pain with shortness of breath and dizziness.  EXAM: CHEST  2 VIEW  COMPARISON:  11/08/2013 and 08/03/2013  FINDINGS: There is new slight cardiomegaly. CABG. Scarring in the left midzone, stable. Lungs are otherwise clear. No effusions. No significant osseous abnormality.  IMPRESSION: New cardiomegaly.  No other change.   Electronically Signed   By: Rozetta Nunnery M.D.   On: 01/25/2014 15:37    Review of Systems  Constitutional: Positive for diaphoresis. Negative for fever.  HENT: Negative for congestion.   Respiratory: Positive for shortness of breath (Slight). Negative for cough.   Cardiovascular: Positive for chest pain, orthopnea (Sleeps on two pillows) and leg swelling. Negative for PND.  Gastrointestinal: Negative for nausea, vomiting, abdominal pain, blood in stool and melena.  Genitourinary: Negative for hematuria.  Musculoskeletal: Positive for neck pain (Anteriorly).  Neurological: Negative for dizziness.  All other systems reviewed and are negative.   Blood pressure 149/71, pulse 63, temperature 98.6 F (37 C), temperature source Oral, resp. rate 17, height _0  (1.803 m), weight 244 lb (110.678 kg), SpO2 99.00%. Physical Exam  Nursing note and vitals reviewed. Constitutional: She is oriented to person, place, and time. She appears well-developed.  Obese  HENT:  Head: Normocephalic and atraumatic.  Eyes: EOM are normal. Pupils are equal, round, and  reactive to light. No scleral icterus.  Neck: Normal range of motion. Neck supple.  Cardiovascular: Normal rate, regular rhythm, S1 normal and S2 normal.   No murmur heard. Pulses:      Radial pulses are 2+ on the right side, and 2+ on the left side.       Dorsalis pedis pulses are 1+ on the right side, and 1+ on the left side.  No Carotid Bruit  Respiratory: Effort normal and breath sounds normal. She has no wheezes. She has no rales.  GI: Soft. Bowel sounds are normal. She exhibits no distension. There is no tenderness.  Musculoskeletal: She exhibits  edema.  1+ [name]  Lymphadenopathy:    She has no cervical adenopathy.  Neurological: She is alert and oriented to person, place, and time. She exhibits normal muscle tone.  Skin: Skin is warm and dry.  Psychiatric: She has a normal mood and affect.     Assessment/Plan Principal Problem:   Chest pain Active Problems:   PVD, LSFA PTA 12/12   Hx of CABG x 6 2002, cath 2011 patent grafts, medical Rx   Diabetes mellitus with chronic kidney disease   Dyslipidemia   Essential hypertension, benign   CKD (chronic kidney disease), stage III   CAD (coronary artery disease) of artery bypass graft; DES to VG-Diag 09/28/13  Plan:  The patient presents with vague, intermittent typical/atypical CP.  She does have some slight changes laterally with TWI on the EKG.  Troponin is negative.  BNP is mildly elevated but she does not appear in overt CHF.  She was given one dose of IV lasix in the ER.  Her BP is elevated.  We will increase her toprol to 150 and imdur to 90.  Check one more troponin and if negative DC home.   Tarri Fuller, [place] 01/25/2014, 5:28 PM  Personally seen and examined. Agree with above. Feels good now. First trop neg. She actually came into urgent care to discuss rash. ECG with accentuation of NSSTW changes with possible lateral ischemia. No active CP. Has occasional vague chronic anginal symptoms. She is comfortable going home after  second troponin.

## 2014-01-25 NOTE — ED Notes (Signed)
Pt in x-ray at this time

## 2014-01-25 NOTE — ED Notes (Signed)
Pt left prior to receiving discharge papers - Brooke Stafford, EMT removed pt's IV prior to pt leaving.

## 2014-01-27 ENCOUNTER — Ambulatory Visit: Payer: Medicare Other | Attending: Internal Medicine | Admitting: Internal Medicine

## 2014-01-27 ENCOUNTER — Encounter: Payer: Self-pay | Admitting: Internal Medicine

## 2014-01-27 VITALS — BP 180/104 | HR 72 | Temp 97.6°F | Resp 14 | Ht 70.5 in | Wt 248.0 lb

## 2014-01-27 DIAGNOSIS — E780 Pure hypercholesterolemia: Secondary | ICD-10-CM | POA: Insufficient documentation

## 2014-01-27 DIAGNOSIS — Z951 Presence of aortocoronary bypass graft: Secondary | ICD-10-CM | POA: Diagnosis not present

## 2014-01-27 DIAGNOSIS — L209 Atopic dermatitis, unspecified: Secondary | ICD-10-CM | POA: Diagnosis not present

## 2014-01-27 DIAGNOSIS — F329 Major depressive disorder, single episode, unspecified: Secondary | ICD-10-CM | POA: Diagnosis not present

## 2014-01-27 DIAGNOSIS — I129 Hypertensive chronic kidney disease with stage 1 through stage 4 chronic kidney disease, or unspecified chronic kidney disease: Secondary | ICD-10-CM | POA: Diagnosis not present

## 2014-01-27 DIAGNOSIS — Z7902 Long term (current) use of antithrombotics/antiplatelets: Secondary | ICD-10-CM | POA: Diagnosis not present

## 2014-01-27 DIAGNOSIS — I252 Old myocardial infarction: Secondary | ICD-10-CM | POA: Insufficient documentation

## 2014-01-27 DIAGNOSIS — I2510 Atherosclerotic heart disease of native coronary artery without angina pectoris: Secondary | ICD-10-CM | POA: Diagnosis not present

## 2014-01-27 DIAGNOSIS — Z7982 Long term (current) use of aspirin: Secondary | ICD-10-CM | POA: Diagnosis not present

## 2014-01-27 DIAGNOSIS — I739 Peripheral vascular disease, unspecified: Secondary | ICD-10-CM | POA: Diagnosis not present

## 2014-01-27 DIAGNOSIS — E669 Obesity, unspecified: Secondary | ICD-10-CM | POA: Insufficient documentation

## 2014-01-27 DIAGNOSIS — I200 Unstable angina: Secondary | ICD-10-CM | POA: Diagnosis not present

## 2014-01-27 DIAGNOSIS — I10 Essential (primary) hypertension: Secondary | ICD-10-CM

## 2014-01-27 DIAGNOSIS — E039 Hypothyroidism, unspecified: Secondary | ICD-10-CM | POA: Insufficient documentation

## 2014-01-27 DIAGNOSIS — J45909 Unspecified asthma, uncomplicated: Secondary | ICD-10-CM | POA: Insufficient documentation

## 2014-01-27 DIAGNOSIS — E1140 Type 2 diabetes mellitus with diabetic neuropathy, unspecified: Secondary | ICD-10-CM | POA: Insufficient documentation

## 2014-01-27 DIAGNOSIS — Z794 Long term (current) use of insulin: Secondary | ICD-10-CM | POA: Diagnosis not present

## 2014-01-27 DIAGNOSIS — N186 End stage renal disease: Secondary | ICD-10-CM

## 2014-01-27 DIAGNOSIS — Z6835 Body mass index (BMI) 35.0-35.9, adult: Secondary | ICD-10-CM | POA: Diagnosis not present

## 2014-01-27 DIAGNOSIS — M109 Gout, unspecified: Secondary | ICD-10-CM | POA: Diagnosis not present

## 2014-01-27 DIAGNOSIS — E1122 Type 2 diabetes mellitus with diabetic chronic kidney disease: Secondary | ICD-10-CM | POA: Insufficient documentation

## 2014-01-27 MED ORDER — HYDROCORTISONE 0.5 % EX CREA: 1.0000 "application " | TOPICAL_CREAM | Freq: Two times a day (BID) | CUTANEOUS | Status: DC

## 2014-01-27 MED ORDER — CYCLOBENZAPRINE HCL 10 MG PO TABS: 10.0000 mg | ORAL_TABLET | Freq: Three times a day (TID) | ORAL | Status: DC | PRN

## 2014-01-27 MED ORDER — GABAPENTIN 400 MG PO CAPS: 400.0000 mg | ORAL_CAPSULE | Freq: Three times a day (TID) | ORAL | Status: DC

## 2014-01-27 MED ORDER — CLONIDINE HCL 0.1 MG PO TABS
0.1000 mg | ORAL_TABLET | Freq: Once | ORAL | Status: AC
  Administered 2014-01-27: 0.1 mg via ORAL

## 2014-01-27 MED ORDER — ACETAMINOPHEN-CODEINE #3 300-30 MG PO TABS: 1.0000 | ORAL_TABLET | ORAL | Status: DC | PRN

## 2014-01-27 NOTE — Progress Notes (Signed)
Last visit here pt was sent to the ED with an abnormal ECG. Pt was instructed to return to the clinic after she was discharged from the ED.

## 2014-01-29 NOTE — ED Provider Notes (Signed)
Medical screening examination/treatment/procedure(s) were conducted as a shared visit with non-physician practitioner(s) and myself.  I personally evaluated the patient during the encounter.  ECG: SR 70, hypertrophy, lateral T wave inversions - new.  On my exam the patient was CP free, and gave a somewhat confusing account for her presentation.  However, she did describe CP earlier in the day, and there was concern for inconsistent medication compliance. With new T wave changes, and her HX (including noted need for antiplatelet therapy per cardiology) I discussed her presentation with out cardiology team, who was to evaluate the patient and assist with management / disposition. On sign-out this consultation was pending.  Carmin Muskrat, MD 01/29/14 [phone]

## 2014-02-03 ENCOUNTER — Telehealth: Payer: Self-pay | Admitting: Internal Medicine

## 2014-02-03 NOTE — Telephone Encounter (Signed)
Pt. Called to ask if she needed to keep the appointment to get her BP Checked..Please f/u with pt.

## 2014-02-09 ENCOUNTER — Ambulatory Visit: Payer: Medicare Other | Attending: Internal Medicine

## 2014-02-09 MED ORDER — AMLODIPINE BESYLATE 10 MG PO TABS: 10.0000 mg | ORAL_TABLET | Freq: Every day | ORAL | Status: DC

## 2014-02-09 NOTE — Progress Notes (Unsigned)
Rx Norvasc was e-scribe to [facility] Return in 2 weeks to follow up with PCP  Per Dr Annitta Needs

## 2014-02-14 ENCOUNTER — Encounter: Payer: Self-pay | Admitting: *Deleted

## 2014-02-14 NOTE — Progress Notes (Signed)
rx refill request. I am unable to refill the rx of glimepiride b/c she has never been prescribed that by our doctors.

## 2014-02-15 DIAGNOSIS — L723 Sebaceous cyst: Secondary | ICD-10-CM | POA: Diagnosis not present

## 2014-02-15 DIAGNOSIS — L309 Dermatitis, unspecified: Secondary | ICD-10-CM | POA: Diagnosis not present

## 2014-02-22 ENCOUNTER — Other Ambulatory Visit: Payer: Self-pay | Admitting: *Deleted

## 2014-02-22 MED ORDER — INSULIN SYRINGES (DISPOSABLE) U-100 1 ML MISC: Status: DC

## 2014-02-22 MED ORDER — INSULIN ASPART 100 UNIT/ML FLEXPEN: 20.0000 [IU] | PEN_INJECTOR | Freq: Three times a day (TID) | SUBCUTANEOUS | Status: DC

## 2014-03-01 ENCOUNTER — Telehealth: Payer: Self-pay | Admitting: Cardiovascular Disease

## 2014-03-08 NOTE — Telephone Encounter (Signed)
Closed encounter °

## 2014-03-16 ENCOUNTER — Other Ambulatory Visit: Payer: Self-pay

## 2014-03-16 MED ORDER — CLOPIDOGREL BISULFATE 75 MG PO TABS: 75.0000 mg | ORAL_TABLET | Freq: Every day | ORAL | Status: DC

## 2014-03-23 ENCOUNTER — Encounter (HOSPITAL_COMMUNITY): Payer: Self-pay | Admitting: Cardiovascular Disease

## 2014-03-24 ENCOUNTER — Encounter (HOSPITAL_COMMUNITY): Payer: Self-pay | Admitting: Cardiovascular Disease

## 2014-04-05 ENCOUNTER — Ambulatory Visit: Payer: Medicare Other | Admitting: Internal Medicine

## 2014-04-21 DIAGNOSIS — M25562 Pain in left knee: Secondary | ICD-10-CM | POA: Diagnosis not present

## 2014-04-21 DIAGNOSIS — M79641 Pain in right hand: Secondary | ICD-10-CM | POA: Diagnosis not present

## 2014-04-21 DIAGNOSIS — M25579 Pain in unspecified ankle and joints of unspecified foot: Secondary | ICD-10-CM | POA: Diagnosis not present

## 2014-04-21 DIAGNOSIS — M25561 Pain in right knee: Secondary | ICD-10-CM | POA: Diagnosis not present

## 2014-04-21 DIAGNOSIS — M109 Gout, unspecified: Secondary | ICD-10-CM | POA: Diagnosis not present

## 2014-04-21 DIAGNOSIS — M79642 Pain in left hand: Secondary | ICD-10-CM | POA: Diagnosis not present

## 2014-04-28 ENCOUNTER — Other Ambulatory Visit: Payer: Self-pay | Admitting: *Deleted

## 2014-04-28 MED ORDER — INSULIN GLARGINE 100 UNIT/ML ~~LOC~~ SOLN: 35.0000 [IU] | Freq: Two times a day (BID) | SUBCUTANEOUS | Status: DC

## 2014-05-03 ENCOUNTER — Encounter: Payer: Self-pay | Admitting: Internal Medicine

## 2014-05-03 ENCOUNTER — Other Ambulatory Visit: Payer: Self-pay | Admitting: *Deleted

## 2014-05-03 ENCOUNTER — Ambulatory Visit (INDEPENDENT_AMBULATORY_CARE_PROVIDER_SITE_OTHER): Payer: Medicare Other | Admitting: Internal Medicine

## 2014-05-03 ENCOUNTER — Other Ambulatory Visit: Payer: Self-pay | Admitting: Internal Medicine

## 2014-05-03 VITALS — BP 138/82 | HR 82 | Temp 97.5°F | Resp 12 | Wt 243.0 lb

## 2014-05-03 DIAGNOSIS — N189 Chronic kidney disease, unspecified: Secondary | ICD-10-CM

## 2014-05-03 DIAGNOSIS — E039 Hypothyroidism, unspecified: Secondary | ICD-10-CM

## 2014-05-03 DIAGNOSIS — E1122 Type 2 diabetes mellitus with diabetic chronic kidney disease: Secondary | ICD-10-CM

## 2014-05-03 LAB — HEMOGLOBIN A1C: HEMOGLOBIN A1C: 9.1 % — AB (ref 4.6–6.5)

## 2014-05-03 MED ORDER — INSULIN ASPART 100 UNIT/ML FLEXPEN: 25.0000 [IU] | PEN_INJECTOR | Freq: Three times a day (TID) | SUBCUTANEOUS | Status: DC

## 2014-05-03 MED ORDER — INSULIN GLARGINE 100 UNIT/ML ~~LOC~~ SOLN: 42.0000 [IU] | Freq: Two times a day (BID) | SUBCUTANEOUS | Status: DC

## 2014-05-03 MED ORDER — LEVOTHYROXINE SODIUM 25 MCG PO TABS: 25.0000 ug | ORAL_TABLET | Freq: Every day | ORAL | Status: DC

## 2014-05-03 NOTE — Telephone Encounter (Signed)
Refill was send to [facility]

## 2014-05-03 NOTE — Telephone Encounter (Signed)
Pt has appt scheduled on 05/16/14 for follow up but says she has run out of bp and thyroid medications. Please send refills to [facility] on [address].

## 2014-05-03 NOTE — Progress Notes (Signed)
Patient ID: Brooke Stafford, female   DOB: 10-17-1955, 59 y.o.   MRN: [id number]  HPI: Brooke Stafford is a 59 y.o.-year-old female, returning for f/u for DM2, dx 2006, insulin-dependent since 2012, uncontrolled, with complications (CKD, iCMP, CAD - s/p CABG x 6, 2002, PN). Last visit 3 mo ago.  Last hemoglobin A1c was: Lab Results  Component Value Date   HGBA1C 8.7* 01/04/2014   HGBA1C 10.5* 09/27/2013   HGBA1C 10.7* 08/04/2013  She had a steroid inj for gout in 10/19/2013.  Pt is on a regimen of: She may forget to take the insulin. - Lantus 60 units qhs >> 35 units 2x a day - bottle - Novolog - pen: 30 units before breakfast 20 units before lunch 35 units before dinner  I advised her to add 5 units of NovoLog if she plans to have cookies - sliding scale of NovoLog - added 11/2013: - 150- 165: + 1 unit  - 166- 180: + 2 units  - 181- 195: + 3 units  - 196- 210: + 4 units  - 211- 225: + 5 units              - >225: + 6 units  We stopped Glimepiride 4 mg in am.  She stopped Metformin (abd.pain, CKD).  We stopped Tradjenta 5 mg daily.  Pt checks her sugars 3 a day and they are worse despite reducing sweets (cookies): - am: 116-170 (if cookies at night: 190-200 in am; if does not take Lantus: 190-200 in am) >> 134-194 >> 150-228 - 2h after b'fast: 240-250 >> 197 >> n/c - before lunch: 240-250 >> 144, 173 >> 152-190, 201 x1 >> 118, 180-262 - 2h after lunch: 160-170 >> 270 x1 > n/c - before dinner: 140 >> n/c >> 152-198 >> 173, 194-288, 352 - 2h after dinner: high 100s-215 >> 151, 159, 343 (cookies) >> n/c - bedtime: 176, 193, 223 (cookies) >> n/c No lows. Lowest sugar was 118; she has hypoglycemia awareness at 100.  Highest sugar was 352.  Pt's meals are: - Breakfast: toast 2 slices - Lunch: sandwich, fruit - Dinner: meat, veggies, 1 starch - Snacks: 2: fruit or sandwich, diet Pepsi, 1/2 banana, PB crackers  - She has CKD, last BUN/creatinine:  Lab Results  Component  Value Date   BUN 19 01/25/2014   CREATININE 1.38* 01/25/2014  Decreased from 1.8. Not on ACEI.  - last set of lipids: Lab Results  Component Value Date   CHOL 180 08/04/2013   HDL 35* 08/04/2013   LDLCALC 99 08/04/2013   TRIG 230* 08/04/2013   CHOLHDL 5.1 08/04/2013  On Zetia and Lipitor. Takes 1 ASA a day (81 mg) - last eye exam was in 08/2013. No DR reportedly. She has mild glaucoma.  - + numbness and tingling in her feet. She has PN >> takes gabapentin 400 mg 1-2x a day.   I reviewed pt's medications, allergies, PMH, social hx, family hx, and changes were documented in the history of present illness. Otherwise, unchanged from my initial visit note..   ROS: Constitutional: no weight gain, + fatigue, + hot flushes, + poor sleep Eyes: no blurry vision, no xerophthalmia ENT: no sore throat, no nodules palpated in throat, no dysphagia/odynophagia, + hoarseness, + tinnitus, + hypoacusis Cardiovascular: no CP/+ SOB/no palpitations/leg swelling Respiratory: no cough/+ SOB/no wheezing Gastrointestinal: no N/V/D/C, no heartburn Musculoskeletal: + both: muscle aches/joint aches Skin: no rash, no itching Neurological: no tremors/numbness/tingling/dizziness, + HA  PE: BP 138/82  mmHg  Pulse 82  Temp(Src) 97.5 F (36.4 C) (Oral)  Resp 12  Wt 243 lb (110.224 kg)  SpO2 96% Body mass index is 34.36 kg/(m^2).  Wt Readings from Last 3 Encounters:  05/03/14 243 lb (110.224 kg)  01/27/14 248 lb (112.492 kg)  01/25/14 244 lb (110.678 kg)   Constitutional: obese, in NAD Eyes: PERRLA, EOMI, no exophthalmos ENT: moist mucous membranes, no thyromegaly, no cervical lymphadenopathy Cardiovascular: RRR, No MRG, + R ankle swelling - gout Respiratory: CTA B Gastrointestinal: abdomen soft, NT, ND, BS+ Musculoskeletal: no deformities, strength intact in all 4 Skin: moist, warm, no rashes, + hirsutism Neurological: no tremor with outstretched hands, DTR normal in all 4  ASSESSMENT: 1. DM2,  insulin-dependent, uncontrolled, with complications - CKD - iCMP, CAD - s/p CABG x 6, 2002, s/p stent 2006, s/p stent 09/2013 - Dr. Sallyanne Kuster - PN  PLAN:  1. Patient with long-standing, uncontrolled diabetes, on basal-bolus insulin regimen, with improvement in her sugars after stopping eating cookies, but now sugars again >200s. - We discussed about options for treatment, and I suggested to increase the insulin doses as follows. In the meantime, continue to work on her diet. At next visit, we need to change to U500 insulin, if not improved ctrl.    Patient Instructions  Please increase Lantus to 42 units 2x a day Increase Novolog as follows: 35 units before breakfast 25 units before lunch 40 units before dinner Continue the sliding scale of NovoLog: - 150- 165: + 1 unit  - 166- 180: + 2 units  - 181- 195: + 3 units  - 196- 210: + 4 units  - 211- 225: + 5 units  - >225: + 6 units   Please return in 1 month with your sugar log.  Please stop at the lab.  - continue checking sugars at different times of the day - check 3 times a day, rotating checks - check before meals for now - up to date with yearly eye exams - had flu vaccine this season - will check HbA1c today - Return to clinic in 1 mo with sugar log   Office Visit on 05/03/2014  Component Date Value Ref Range Status  . Hgb A1c MFr Bld 05/03/2014 9.1* 4.6 - 6.5 % Final   Glycemic Control Guidelines for People with Diabetes:Non Diabetic:  <6%Goal of Therapy: <7%Additional Action Suggested:  >8%    HbA1c higher >> see plan above.

## 2014-05-03 NOTE — Patient Instructions (Signed)
Please increase Lantus to 42 units 2x a day Increase Novolog as follows: 35 units before breakfast 25 units before lunch 40 units before dinner Continue the sliding scale of NovoLog: - 150- 165: + 1 unit  - 166- 180: + 2 units  - 181- 195: + 3 units  - 196- 210: + 4 units  - 211- 225: + 5 units  - >225: + 6 units   Please return in 1 month with your sugar log.  Please stop at the lab.

## 2014-05-16 ENCOUNTER — Ambulatory Visit: Payer: Medicare Other | Attending: Internal Medicine | Admitting: Internal Medicine

## 2014-05-16 ENCOUNTER — Encounter: Payer: Self-pay | Admitting: Internal Medicine

## 2014-05-16 VITALS — BP 148/93 | HR 90 | Temp 98.2°F | Ht 70.5 in | Wt 245.4 lb

## 2014-05-16 DIAGNOSIS — E1140 Type 2 diabetes mellitus with diabetic neuropathy, unspecified: Secondary | ICD-10-CM | POA: Diagnosis not present

## 2014-05-16 DIAGNOSIS — I10 Essential (primary) hypertension: Secondary | ICD-10-CM

## 2014-05-16 DIAGNOSIS — I2510 Atherosclerotic heart disease of native coronary artery without angina pectoris: Secondary | ICD-10-CM | POA: Insufficient documentation

## 2014-05-16 DIAGNOSIS — I129 Hypertensive chronic kidney disease with stage 1 through stage 4 chronic kidney disease, or unspecified chronic kidney disease: Secondary | ICD-10-CM | POA: Insufficient documentation

## 2014-05-16 DIAGNOSIS — Z7902 Long term (current) use of antithrombotics/antiplatelets: Secondary | ICD-10-CM | POA: Diagnosis not present

## 2014-05-16 DIAGNOSIS — E669 Obesity, unspecified: Secondary | ICD-10-CM | POA: Diagnosis not present

## 2014-05-16 DIAGNOSIS — I252 Old myocardial infarction: Secondary | ICD-10-CM | POA: Insufficient documentation

## 2014-05-16 DIAGNOSIS — Z6834 Body mass index (BMI) 34.0-34.9, adult: Secondary | ICD-10-CM | POA: Diagnosis not present

## 2014-05-16 DIAGNOSIS — E1122 Type 2 diabetes mellitus with diabetic chronic kidney disease: Secondary | ICD-10-CM | POA: Insufficient documentation

## 2014-05-16 DIAGNOSIS — F329 Major depressive disorder, single episode, unspecified: Secondary | ICD-10-CM | POA: Insufficient documentation

## 2014-05-16 DIAGNOSIS — N189 Chronic kidney disease, unspecified: Secondary | ICD-10-CM

## 2014-05-16 DIAGNOSIS — F32A Depression, unspecified: Secondary | ICD-10-CM

## 2014-05-16 DIAGNOSIS — Z9861 Coronary angioplasty status: Secondary | ICD-10-CM | POA: Diagnosis not present

## 2014-05-16 DIAGNOSIS — I509 Heart failure, unspecified: Secondary | ICD-10-CM | POA: Diagnosis not present

## 2014-05-16 DIAGNOSIS — Z7982 Long term (current) use of aspirin: Secondary | ICD-10-CM | POA: Diagnosis not present

## 2014-05-16 DIAGNOSIS — I739 Peripheral vascular disease, unspecified: Secondary | ICD-10-CM | POA: Diagnosis not present

## 2014-05-16 DIAGNOSIS — Z1231 Encounter for screening mammogram for malignant neoplasm of breast: Secondary | ICD-10-CM | POA: Insufficient documentation

## 2014-05-16 DIAGNOSIS — Z794 Long term (current) use of insulin: Secondary | ICD-10-CM | POA: Insufficient documentation

## 2014-05-16 DIAGNOSIS — Z1211 Encounter for screening for malignant neoplasm of colon: Secondary | ICD-10-CM | POA: Insufficient documentation

## 2014-05-16 DIAGNOSIS — Z951 Presence of aortocoronary bypass graft: Secondary | ICD-10-CM | POA: Insufficient documentation

## 2014-05-16 DIAGNOSIS — J45909 Unspecified asthma, uncomplicated: Secondary | ICD-10-CM | POA: Diagnosis not present

## 2014-05-16 DIAGNOSIS — E039 Hypothyroidism, unspecified: Secondary | ICD-10-CM | POA: Diagnosis not present

## 2014-05-16 DIAGNOSIS — E785 Hyperlipidemia, unspecified: Secondary | ICD-10-CM | POA: Insufficient documentation

## 2014-05-16 LAB — GLUCOSE, POCT (MANUAL RESULT ENTRY): POC Glucose: 244 mg/dl — AB (ref 70–99)

## 2014-05-16 MED ORDER — TRAZODONE HCL 50 MG PO TABS: 25.0000 mg | ORAL_TABLET | Freq: Every day | ORAL | Status: DC

## 2014-05-16 MED ORDER — ACETAMINOPHEN-CODEINE #3 300-30 MG PO TABS: 1.0000 | ORAL_TABLET | ORAL | Status: DC | PRN

## 2014-05-16 MED ORDER — CLONIDINE HCL 0.2 MG PO TABS
0.2000 mg | ORAL_TABLET | Freq: Two times a day (BID) | ORAL | Status: DC
Start: 2014-05-16 — End: 2014-08-11

## 2014-05-16 NOTE — Progress Notes (Signed)
Pt states that she has been having stomach pain on the left side that was worse when she ate.  She also c/o having a lot of bloating and gas. She states this has improved some but is still present. Pt also c/o having muscle spasms in her hands, back, and legs.  Pt has not had Clonidine x 1 week. Pt denies any headaches, chest pain, or dizziness.   Pt CBG today was 244, A1C from 05-03-14 was 9.1.

## 2014-05-16 NOTE — Progress Notes (Signed)
Patient ID: BABE BUCKHANNON, female   DOB: [birth year], 59 y.o.   MRN: [id number]   Shailey Uhl, is a 59 y.o. female  [id number]  [id number]  DOB - [birth year]  Chief Complaint  Patient presents with  . Follow-up        Subjective:   Allura Salameh is a 59 y.o. female here today for a follow up visit. She has a past medical history significant for severe type 2 diabetes mellitus requiring insulin therapy, systemic hypertension, hyperlipidemia, obesity, PVD S/P LSFA PTA initally in 2012 and again in 2014, coronary artery disease, S/P CABG x 6 in 2002. Cardiac catheterization in June 2015 showed a patent but severely stenotic saphenous vein graft to the diagonal artery, as was chronic occlusion of the saphenous vein graft to the ramus intermedius (LIMA to the LAD patent, SVG to PDA patent). She underwent stenting of the SVG to the diagonal artery.  She follows up regularly with endocrinologist and adjustments were made to her insulin recently. She is here today for medication refills. She has no new complaint. She has not had colonoscopy, she has not had mammogram in the last 2 years. She claims compliant with medications, she has lost a few pounds in the past few months deliberately, she reports no side effects to medications. She was having some vague left-sided abdominal pain that has resolved. Patient has No headache, No chest pain, No Nausea, No new weakness tingling or numbness, No Cough - SOB.  Problem  Encounter for Screening Mammogram for Breast Cancer  Screening for Colon Cancer    ALLERGIES: Allergies  Allergen Reactions  . Hydralazine Shortness Of Breath  . Penicillins Cross Reactors Hives    And high fever  . Adhesive [Tape] Rash    bruising    PAST MEDICAL HISTORY: Past Medical History  Diagnosis Date  . Asthma   . Anxiety   . GERD (gastroesophageal reflux disease)   . Coronary artery disease 2002    CABG x 6. Cath 5/11- med Rx  . Hypertension   .  Peripheral vascular disease 12/12    LSFA PTA  . Anemia   . Hyperlipidemia   . Chronic renal insufficiency, stage II (mild)     followed  by [facility]  . CAD (coronary artery disease) 2002; 2015    CABG x 6 2002, cath 2011- med Rx stent DES VG-Diag  . Hypothyroid     treated  . Obesity (BMI 35.0-39.9 without comorbidity)   . CHF (congestive heart failure)     "in 2002" (11/26/2012)  . Myocardial infarction 2000; 2002; 2011?  [name] Chronic bronchitis     "q year; in the winter" (08/04/2013)  . Type II diabetes mellitus   . Depression   . Pneumonia     "3 times I think" (08/04/2013)  . History of blood transfusion 2002    "when I had OHS"  . Migraines     "couple times/year" (08/04/2013)  . Headache     "~ q week" (08/04/2013)  . Arthritis     "stiff fingers and knees" (08/04/2013)  . Gout     "right big toe"  . Anginal pain   . CAD (coronary artery disease) of artery bypass graft; DES to VG-Diag 09/28/13 11/09/2013    MEDICATIONS AT HOME: Prior to Admission medications   Medication Sig Start Date End Date Taking? Authorizing Provider  albuterol (PROVENTIL HFA;VENTOLIN HFA) 108 (90 BASE) MCG/ACT inhaler Inhale 2 puffs into the lungs every 6 (six) hours  as needed for wheezing or shortness of breath.   Yes Historical Provider, MD  allopurinol (ZYLOPRIM) 300 MG tablet Take 300 mg by mouth daily.    Yes Historical Provider, MD  aspirin 81 MG chewable tablet Chew 1 tablet (81 mg total) by mouth daily. 09/29/13  Yes Brittainy Erie Noe, PA-C  atorvastatin (LIPITOR) 40 MG tablet Take 1 tablet (40 mg total) by mouth daily. 06/02/13  Yes Reyne Dumas, MD  clopidogrel (PLAVIX) 75 MG tablet Take 1 tablet (75 mg total) by mouth daily. 03/16/14  Yes Tresa Garter, MD  cyclobenzaprine (FLEXERIL) 10 MG tablet Take 1 tablet (10 mg total) by mouth 3 (three) times daily as needed for muscle spasms. 01/27/14  Yes Tresa Garter, MD  ezetimibe (ZETIA) 10 MG tablet Take 1 tablet (10 mg total)  by mouth daily. 08/30/13  Yes Brittainy Erie Noe, PA-C  fluticasone (FLONASE) 50 MCG/ACT nasal spray Place 2 sprays into both nostrils daily as needed for allergies or rhinitis.   Yes Historical Provider, MD  gabapentin (NEURONTIN) 400 MG capsule Take 1 capsule (400 mg total) by mouth 3 (three) times daily. 01/27/14  Yes Tresa Garter, MD  hydrocortisone cream 0.5 % Apply 1 application topically 2 (two) times daily. 01/27/14  Yes Rosaline Ezekiel Essie Christine, MD  insulin aspart (NOVOLOG FLEXPEN) 100 UNIT/ML FlexPen Inject 25-45 Units into the skin 3 (three) times daily with meals. Inject 30 units subq at breakfast, inject 20 units subq at lunch and inject 35 units subq at supper. 05/03/14  Yes Philemon Kingdom, MD  insulin glargine (LANTUS) 100 UNIT/ML injection Inject 0.42 mLs (42 Units total) into the skin 2 (two) times daily. 05/03/14  Yes Philemon Kingdom, MD  Insulin Pen Needle 32G X 4 MM MISC Use to inject insulin daily as instructed. 12/07/13  Yes Philemon Kingdom, MD  Insulin Syringes, Disposable, (B-D INSULIN SYRINGE 1CC) U-100 1 ML MISC Use to inject insulin 4 times daily as instructed. 02/22/14  Yes Philemon Kingdom, MD  isosorbide mononitrate (IMDUR) 60 MG 24 hr tablet Take 1.5 tablets (90 mg total) by mouth daily. 01/25/14  Yes Brett Canales, PA-C  levothyroxine (SYNTHROID, LEVOTHROID) 25 MCG tablet Take 1 tablet (25 mcg total) by mouth daily before breakfast. 05/03/14  Yes Tresa Garter, MD  loratadine (CLARITIN) 10 MG tablet Take 1 tablet (10 mg total) by mouth daily. 09/21/13  Yes Lorayne Marek, MD  Menthol-Camphor (TIGER BALM ARTHRITIS RUB) 11-11 % CREA Apply 1 application topically 2 (two) times daily.   Yes Historical Provider, MD  metoprolol succinate (TOPROL-XL) 50 MG 24 hr tablet Take 3 tablets (150 mg total) by mouth daily. Take with or immediately following a meal. 01/25/14  Yes Brett Canales, PA-C  nitroGLYCERIN (NITROSTAT) 0.4 MG SL tablet Place 1 tablet (0.4 mg total) under the  tongue every 5 (five) minutes as needed. For chest 08/30/13  Yes Brittainy Erie Noe, PA-C  polyethylene glycol (MIRALAX / GLYCOLAX) packet Take 17 g by mouth daily as needed for moderate constipation.   Yes Historical Provider, MD  acetaminophen-codeine (TYLENOL #3) 300-30 MG per tablet Take 1 tablet by mouth every 4 (four) hours as needed. 05/16/14   Tresa Garter, MD  cloNIDine (CATAPRES) 0.2 MG tablet Take 1 tablet (0.2 mg total) by mouth 2 (two) times daily. 05/16/14   Tresa Garter, MD  traZODone (DESYREL) 50 MG tablet Take 0.5 tablets (25 mg total) by mouth at bedtime. 05/16/14   Tresa Garter, MD  Objective:   Filed Vitals:   05/16/14 1507  BP: 148/93  Pulse: 90  Temp: 98.2 F (36.8 C)  TempSrc: Oral  Height: 5' 10.5" (1.791 m)  Weight: 245 lb 6.4 oz (111.313 kg)  SpO2: 94%    Exam General appearance : Awake, alert, not in any distress. Speech Clear. Not toxic looking, obese HEENT: Atraumatic and Normocephalic, pupils equally reactive to light and accomodation Neck: supple, no JVD. No cervical lymphadenopathy.  Chest:Good air entry bilaterally, no added sounds  CVS: S1 S2 regular, no murmurs.  Abdomen: Bowel sounds present, Non tender and not distended with no gaurding, rigidity or rebound. Extremities: B/L Lower Ext shows no edema, both legs are warm to touch, poor Dorsalis pedis arterial pulsation bilaterally Neurology: Awake alert, and oriented X 3, CN II-XII intact, Non focal Skin:No Rash Wounds:N/A  Data Review Lab Results  Component Value Date   HGBA1C 9.1* 05/03/2014   HGBA1C 8.7* 01/04/2014   HGBA1C 10.5* 09/27/2013     Assessment & Plan   1. Type 2 diabetes mellitus with diabetic chronic kidney disease  - Glucose (CBG) - Ambulatory referral to Podiatry  2. Diabetic neuropathy, type II diabetes mellitus  - acetaminophen-codeine (TYLENOL #3) 300-30 MG per tablet; Take 1 tablet by mouth every 4 (four) hours as needed.  Dispense: 60  tablet; Refill: 0  - Ambulatory referral to Podiatry for routine diabetic foot examination  3. Screening for colon cancer  - HM COLONOSCOPY - Ambulatory referral to Gastroenterology  4. Encounter for screening mammogram for breast cancer  - MM Digital Screening; Future  5. Essential hypertension Refill - cloNIDine (CATAPRES) 0.2 MG tablet; Take 1 tablet (0.2 mg total) by mouth 2 (two) times daily.  Dispense: 180 tablet; Refill: 3  6. Depression Refill - traZODone (DESYREL) 50 MG tablet; Take 0.5 tablets (25 mg total) by mouth at bedtime.  Dispense: 45 tablet; Refill: 3   Patient was counseled extensively about nutrition and exercise as tolerated.   Return in about 3 months (around 08/14/2014), or if symptoms worsen or fail to improve, for Hemoglobin A1C and Follow up, DM, Follow up HTN, Follow up Pain and comorbidities.  The patient was given clear instructions to go to ER or return to medical center if symptoms don't improve, worsen or new problems develop. The patient verbalized understanding. The patient was told to call to get lab results if they haven't heard anything in the next week.   This note has been created with Surveyor, quantity. Any transcriptional errors are unintentional.    Angelica Chessman, MD, [place], [place], [place], [place]   05/16/2014, 4:02 PM

## 2014-05-16 NOTE — Patient Instructions (Signed)
DASH Eating Plan DASH stands for "Dietary Approaches to Stop Hypertension." The DASH eating plan is a healthy eating plan that has been shown to reduce high blood pressure (hypertension). Additional health benefits may include reducing the risk of type 2 diabetes mellitus, heart disease, and stroke. The DASH eating plan may also help with weight loss. WHAT DO I NEED TO KNOW ABOUT THE DASH EATING PLAN? For the DASH eating plan, you will follow these general guidelines:  Choose foods with a percent daily value for sodium of less than 5% (as listed on the food label).  Use salt-free seasonings or herbs instead of table salt or sea salt.  Check with your health care provider or pharmacist before using salt substitutes.  Eat lower-sodium products, often labeled as "lower sodium" or "no salt added."  Eat fresh foods.  Eat more vegetables, fruits, and low-fat dairy products.  Choose whole grains. Look for the word "whole" as the first word in the ingredient list.  Choose fish and skinless chicken or turkey more often than red meat. Limit fish, poultry, and meat to 6 oz (170 g) each day.  Limit sweets, desserts, sugars, and sugary drinks.  Choose heart-healthy fats.  Limit cheese to 1 oz (28 g) per day.  Eat more home-cooked food and less restaurant, buffet, and fast food.  Limit fried foods.  Cook foods using methods other than frying.  Limit canned vegetables. If you do use them, rinse them well to decrease the sodium.  When eating at a restaurant, ask that your food be prepared with less salt, or no salt if possible. WHAT FOODS CAN I EAT? Seek help from a dietitian for individual calorie needs. Grains Whole grain or whole wheat bread. Brown rice. Whole grain or whole wheat pasta. Quinoa, bulgur, and whole grain cereals. Low-sodium cereals. Corn or whole wheat flour tortillas. Whole grain cornbread. Whole grain crackers. Low-sodium crackers. Vegetables Fresh or frozen vegetables  (raw, steamed, roasted, or grilled). Low-sodium or reduced-sodium tomato and vegetable juices. Low-sodium or reduced-sodium tomato sauce and paste. Low-sodium or reduced-sodium canned vegetables.  Fruits All fresh, canned (in natural juice), or frozen fruits. Meat and Other Protein Products Ground beef (85% or leaner), grass-fed beef, or beef trimmed of fat. Skinless chicken or turkey. Ground chicken or turkey. Pork trimmed of fat. All fish and seafood. Eggs. Dried beans, peas, or lentils. Unsalted nuts and seeds. Unsalted canned beans. Dairy Low-fat dairy products, such as skim or 1% milk, 2% or reduced-fat cheeses, low-fat ricotta or cottage cheese, or plain low-fat yogurt. Low-sodium or reduced-sodium cheeses. Fats and Oils Tub margarines without trans fats. Light or reduced-fat mayonnaise and salad dressings (reduced sodium). Avocado. Safflower, olive, or canola oils. Natural peanut or almond butter. Other Unsalted popcorn and pretzels. The items listed above may not be a complete list of recommended foods or beverages. Contact your dietitian for more options. WHAT FOODS ARE NOT RECOMMENDED? Grains White bread. White pasta. White rice. Refined cornbread. Bagels and croissants. Crackers that contain trans fat. Vegetables Creamed or fried vegetables. Vegetables in a cheese sauce. Regular canned vegetables. Regular canned tomato sauce and paste. Regular tomato and vegetable juices. Fruits Dried fruits. Canned fruit in light or heavy syrup. Fruit juice. Meat and Other Protein Products Fatty cuts of meat. Ribs, chicken wings, bacon, sausage, bologna, salami, chitterlings, fatback, hot dogs, bratwurst, and packaged luncheon meats. Salted nuts and seeds. Canned beans with salt. Dairy Whole or 2% milk, cream, half-and-half, and cream cheese. Whole-fat or sweetened yogurt. Full-fat   cheeses or blue cheese. Nondairy creamers and whipped toppings. Processed cheese, cheese spreads, or cheese  curds. Condiments Onion and garlic salt, seasoned salt, table salt, and sea salt. Canned and packaged gravies. Worcestershire sauce. Tartar sauce. Barbecue sauce. Teriyaki sauce. Soy sauce, including reduced sodium. Steak sauce. Fish sauce. Oyster sauce. Cocktail sauce. Horseradish. Ketchup and mustard. Meat flavorings and tenderizers. Bouillon cubes. Hot sauce. Tabasco sauce. Marinades. Taco seasonings. Relishes. Fats and Oils Butter, stick margarine, lard, shortening, ghee, and bacon fat. Coconut, palm kernel, or palm oils. Regular salad dressings. Other Pickles and olives. Salted popcorn and pretzels. The items listed above may not be a complete list of foods and beverages to avoid. Contact your dietitian for more information. WHERE CAN I FIND MORE INFORMATION? National Heart, Lung, and Blood Institute: travelstabloid.com Document Released: 03/21/2011 Document Revised: 08/16/2013 Document Reviewed: 02/03/2013 [facility] Patient Information 2015 [place], [place]. This information is not intended to replace advice given to you by your health care provider. Make sure you discuss any questions you have with your health care provider. Hypertension Hypertension, commonly called high blood pressure, is when the force of blood pumping through your arteries is too strong. Your arteries are the blood vessels that carry blood from your heart throughout your body. A blood pressure reading consists of a higher number over a lower number, such as 110/72. The higher number (systolic) is the pressure inside your arteries when your heart pumps. The lower number (diastolic) is the pressure inside your arteries when your heart relaxes. Ideally you want your blood pressure below 120/80. Hypertension forces your heart to work harder to pump blood. Your arteries may become narrow or stiff. Having hypertension puts you at risk for heart disease, stroke, and other problems.  RISK  FACTORS Some risk factors for high blood pressure are controllable. Others are not.  Risk factors you cannot control include:   Race. You may be at higher risk if you are African American.  Age. Risk increases with age.  Gender. Men are at higher risk than women before age 37 years. After age 55, women are at higher risk than men. Risk factors you can control include:  Not getting enough exercise or physical activity.  Being overweight.  Getting too much fat, sugar, calories, or salt in your diet.  Drinking too much alcohol. SIGNS AND SYMPTOMS Hypertension does not usually cause signs or symptoms. Extremely high blood pressure (hypertensive crisis) may cause headache, anxiety, shortness of breath, and nosebleed. DIAGNOSIS  To check if you have hypertension, your health care provider will measure your blood pressure while you are seated, with your arm held at the level of your heart. It should be measured at least twice using the same arm. Certain conditions can cause a difference in blood pressure between your right and left arms. A blood pressure reading that is higher than normal on one occasion does not mean that you need treatment. If one blood pressure reading is high, ask your health care provider about having it checked again. TREATMENT  Treating high blood pressure includes making lifestyle changes and possibly taking medicine. Living a healthy lifestyle can help lower high blood pressure. You may need to change some of your habits. Lifestyle changes may include:  Following the DASH diet. This diet is high in fruits, vegetables, and whole grains. It is low in salt, red meat, and added sugars.  Getting at least 2 hours of brisk physical activity every week.  Losing weight if necessary.  Not smoking.  Limiting  alcoholic beverages.  Learning ways to reduce stress. If lifestyle changes are not enough to get your blood pressure under control, your health care provider may  prescribe medicine. You may need to take more than one. Work closely with your health care provider to understand the risks and benefits. HOME CARE INSTRUCTIONS  Have your blood pressure rechecked as directed by your health care provider.   Take medicines only as directed by your health care provider. Follow the directions carefully. Blood pressure medicines must be taken as prescribed. The medicine does not work as well when you skip doses. Skipping doses also puts you at risk for problems.   Do not smoke.   Monitor your blood pressure at home as directed by your health care provider. SEEK MEDICAL CARE IF:   You think you are having a reaction to medicines taken.  You have recurrent headaches or feel dizzy.  You have swelling in your ankles.  You have trouble with your vision. SEEK IMMEDIATE MEDICAL CARE IF:  You develop a severe headache or confusion.  You have unusual weakness, numbness, or feel faint.  You have severe chest or abdominal pain.  You vomit repeatedly.  You have trouble breathing. MAKE SURE YOU:   Understand these instructions.  Will watch your condition.  Will get help right away if you are not doing well or get worse. Document Released: 04/01/2005 Document Revised: 08/16/2013 Document Reviewed: 01/22/2013 [facility] Patient Information 2015 [place], [place]. This information is not intended to replace advice given to you by your health care provider. Make sure you discuss any questions you have with your health care provider. Diabetes and Exercise Exercising regularly is important. It is not just about losing weight. It has many health benefits, such as:  Improving your overall fitness, flexibility, and endurance.  Increasing your bone density.  Helping with weight control.  Decreasing your body fat.  Increasing your muscle strength.  Reducing stress and tension.  Improving your overall health. People with diabetes who exercise gain  additional benefits because exercise:  Reduces appetite.  Improves the body's use of blood sugar (glucose).  Helps lower or control blood glucose.  Decreases blood pressure.  Helps control blood lipids (such as cholesterol and triglycerides).  Improves the body's use of the hormone insulin by:  Increasing the body's insulin sensitivity.  Reducing the body's insulin needs.  Decreases the risk for heart disease because exercising:  Lowers cholesterol and triglycerides levels.  Increases the levels of good cholesterol (such as high-density lipoproteins [HDL]) in the body.  Lowers blood glucose levels. YOUR ACTIVITY PLAN  Choose an activity that you enjoy and set realistic goals. Your health care provider or diabetes educator can help you make an activity plan that works for you. Exercise regularly as directed by your health care provider. This includes:  Performing resistance training twice a week such as push-ups, sit-ups, lifting weights, or using resistance bands.  Performing 150 minutes of cardio exercises each week such as walking, running, or playing sports.  Staying active and spending no more than 90 minutes at one time being inactive. Even short bursts of exercise are good for you. Three 10-minute sessions spread throughout the day are just as beneficial as a single 30-minute session. Some exercise ideas include:  Taking the dog for a walk.  Taking the stairs instead of the elevator.  Dancing to your favorite song.  Doing an exercise video.  Doing your favorite exercise with a friend. RECOMMENDATIONS FOR EXERCISING WITH TYPE 1 OR  TYPE 2 DIABETES   Check your blood glucose before exercising. If blood glucose levels are greater than 240 mg/dL, check for urine ketones. Do not exercise if ketones are present.  Avoid injecting insulin into areas of the body that are going to be exercised. For example, avoid injecting insulin into:  The arms when playing  tennis.  The legs when jogging.  Keep a record of:  Food intake before and after you exercise.  Expected peak times of insulin action.  Blood glucose levels before and after you exercise.  The type and amount of exercise you have done.  Review your records with your health care provider. Your health care provider will help you to develop guidelines for adjusting food intake and insulin amounts before and after exercising.  If you take insulin or oral hypoglycemic agents, watch for signs and symptoms of hypoglycemia. They include:  Dizziness.  Shaking.  Sweating.  Chills.  Confusion.  Drink plenty of water while you exercise to prevent dehydration or heat stroke. Body water is lost during exercise and must be replaced.  Talk to your health care provider before starting an exercise program to make sure it is safe for you. Remember, almost any type of activity is better than none. Document Released: 06/22/2003 Document Revised: 08/16/2013 Document Reviewed: 09/08/2012 [facility] Patient Information 2015 [place], [place]. This information is not intended to replace advice given to you by your health care provider. Make sure you discuss any questions you have with your health care provider. Basic Carbohydrate Counting for Diabetes Mellitus Carbohydrate counting is a method for keeping track of the amount of carbohydrates you eat. Eating carbohydrates naturally increases the level of sugar (glucose) in your blood, so it is important for you to know the amount that is okay for you to have in every meal. Carbohydrate counting helps keep the level of glucose in your blood within normal limits. The amount of carbohydrates allowed is different for every person. A dietitian can help you calculate the amount that is right for you. Once you know the amount of carbohydrates you can have, you can count the carbohydrates in the foods you want to eat. Carbohydrates are found in the following  foods:  Grains, such as breads and cereals.  Dried beans and soy products.  Starchy vegetables, such as potatoes, peas, and corn.  Fruit and fruit juices.  Milk and yogurt.  Sweets and snack foods, such as cake, cookies, candy, chips, soft drinks, and fruit drinks. CARBOHYDRATE COUNTING There are two ways to count the carbohydrates in your food. You can use either of the methods or a combination of both. Reading the "Nutrition Facts" on [place] The "Nutrition Facts" is an area that is included on the labels of almost all packaged food and beverages in the [place]. It includes the serving size of that food or beverage and information about the nutrients in each serving of the food, including the grams (g) of carbohydrate per serving.  Decide the number of servings of this food or beverage that you will be able to eat or drink. Multiply that number of servings by the number of grams of carbohydrate that is listed on the label for that serving. The total will be the amount of carbohydrates you will be having when you eat or drink this food or beverage. Learning Standard Serving Sizes of Food When you eat food that is not packaged or does not include "Nutrition Facts" on the label, you need to measure the  servings in order to count the amount of carbohydrates.A serving of most carbohydrate-rich foods contains about 15 g of carbohydrates. The following list includes serving sizes of carbohydrate-rich foods that provide 15 g ofcarbohydrate per serving:   1 slice of bread (1 oz) or 1 six-inch tortilla.    of a hamburger bun or English muffin.  4-6 crackers.   cup unsweetened dry cereal.    cup hot cereal.   cup rice or pasta.    cup mashed potatoes or  of a large baked potato.  1 cup fresh fruit or one small piece of fruit.    cup canned or frozen fruit or fruit juice.  1 cup milk.   cup plain fat-free yogurt or yogurt sweetened with artificial  sweeteners.   cup cooked dried beans or starchy vegetable, such as peas, corn, or potatoes.  Decide the number of standard-size servings that you will eat. Multiply that number of servings by 15 (the grams of carbohydrates in that serving). For example, if you eat 2 cups of strawberries, you will have eaten 2 servings and 30 g of carbohydrates (2 servings x 15 g = 30 g). For foods such as soups and casseroles, in which more than one food is mixed in, you will need to count the carbohydrates in each food that is included. EXAMPLE OF CARBOHYDRATE COUNTING Sample Dinner  3 oz chicken breast.   cup of brown rice.   cup of corn.  1 cup milk.   1 cup strawberries with sugar-free whipped topping.  Carbohydrate Calculation Step 1: Identify the foods that contain carbohydrates:   Rice.   Corn.   Milk.   Strawberries. Step 2:Calculate the number of servings eaten of each:   2 servings of rice.   1 serving of corn.   1 serving of milk.   1 serving of strawberries. Step 3: Multiply each of those number of servings by 15 g:   2 servings of rice x 15 g = 30 g.   1 serving of corn x 15 g = 15 g.   1 serving of milk x 15 g = 15 g.   1 serving of strawberries x 15 g = 15 g. Step 4: Add together all of the amounts to find the total grams of carbohydrates eaten: 30 g + 15 g + 15 g + 15 g = 75 g. Document Released: 04/01/2005 Document Revised: 08/16/2013 Document Reviewed: 02/26/2013 [facility] Patient Information 2015 [place], [place]. This information is not intended to replace advice given to you by your health care provider. Make sure you discuss any questions you have with your health care provider.

## 2014-05-30 ENCOUNTER — Other Ambulatory Visit: Payer: Self-pay | Admitting: *Deleted

## 2014-05-30 DIAGNOSIS — L209 Atopic dermatitis, unspecified: Secondary | ICD-10-CM

## 2014-05-30 MED ORDER — INSULIN ASPART 100 UNIT/ML FLEXPEN: 25.0000 [IU] | PEN_INJECTOR | Freq: Three times a day (TID) | SUBCUTANEOUS | Status: DC

## 2014-05-30 NOTE — Telephone Encounter (Signed)
Refill printed. Going to escript.

## 2014-06-06 DIAGNOSIS — K219 Gastro-esophageal reflux disease without esophagitis: Secondary | ICD-10-CM | POA: Diagnosis not present

## 2014-06-06 DIAGNOSIS — Z1211 Encounter for screening for malignant neoplasm of colon: Secondary | ICD-10-CM | POA: Diagnosis not present

## 2014-06-09 ENCOUNTER — Ambulatory Visit (INDEPENDENT_AMBULATORY_CARE_PROVIDER_SITE_OTHER): Payer: Medicare Other | Admitting: Internal Medicine

## 2014-06-09 ENCOUNTER — Encounter: Payer: Self-pay | Admitting: Internal Medicine

## 2014-06-09 VITALS — BP 108/82 | HR 72 | Temp 97.9°F | Resp 12 | Wt 242.0 lb

## 2014-06-09 DIAGNOSIS — R072 Precordial pain: Secondary | ICD-10-CM

## 2014-06-09 DIAGNOSIS — E1122 Type 2 diabetes mellitus with diabetic chronic kidney disease: Secondary | ICD-10-CM

## 2014-06-09 DIAGNOSIS — N189 Chronic kidney disease, unspecified: Secondary | ICD-10-CM | POA: Diagnosis not present

## 2014-06-09 MED ORDER — INSULIN GLARGINE 300 UNIT/ML ~~LOC~~ SOPN: 92.0000 [IU] | PEN_INJECTOR | Freq: Every day | SUBCUTANEOUS | Status: DC

## 2014-06-09 NOTE — Progress Notes (Signed)
Patient ID: Brooke Stafford, female   DOB: [birth year], 59 y.o.   MRN: [id number]  HPI: Brooke Stafford is a 59 y.o.-year-old female, returning for f/u for DM2, dx 2006, insulin-dependent since 2012, uncontrolled, with complications (CKD, iCMP, CAD - s/p CABG x 6, 2002, PN). Last visit 1.5 mo ago.  Last hemoglobin A1c was: Lab Results  Component Value Date   HGBA1C 9.1* 05/03/2014   HGBA1C 8.7* 01/04/2014   HGBA1C 10.5* 09/27/2013  She had a steroid inj for gout in 10/19/2013.  Pt is on a regimen of: She may forget to take the insulin. - Lantus 60 units qhs >> 35 units 2x a day >> 42 units 2x a day - bottle - Novolog - pen: 35 units before breakfast 25 units before lunch 40 units before dinner  I advised her to add 5 units of NovoLog if she plans to have cookies - sliding scale of NovoLog - added 11/2013: - 150- 165: + 1 unit  - 166- 180: + 2 units  - 181- 195: + 3 units  - 196- 210: + 4 units  - 211- 225: + 5 units              - >225: + 6 units  We stopped Glimepiride 4 mg in am.  She stopped Metformin (abd.pain, CKD).  We stopped Tradjenta 5 mg daily.  Pt checks her sugars 3 a day and they are better (eliminated cookies) but still some 200s as she may forget insulin with meals. Also, she does not take her mealtime insulin if sugars <150! : - am: 190-200 in am; if does not take Lantus: 190-200 in am) >> 134-194 >> 150-228 >> 120-182 - 2h after b'fast: 240-250 >> 197 >> n/c - before lunch: 240-250 >> 144, 173 >> 152-190, 201 x1 >> 118, 180-262 >> 78, 151-192, 200s (if forgets insulin or if out of the house) - 2h after lunch: 160-170 >> 270 x1 > n/c - before dinner: 140 >> n/c >> 152-198 >> 173, 194-288, 352 >> 130-190, some 200s - 2h after dinner: high 100s-215 >> 151, 159, 343 (cookies) >> n/c - bedtime: 176, 193, 223 (cookies) >> n/c No lows. Lowest sugar was 78x1; she has hypoglycemia awareness at 100.  Highest sugar was 200s.  Pt's meals are: - Breakfast: toast 2  slices - Lunch: sandwich, fruit - Dinner: meat, veggies, 1 starch - Snacks: 2: fruit or sandwich, diet Pepsi, 1/2 banana, PB crackers  - She has CKD, last BUN/creatinine:  Lab Results  Component Value Date   BUN 19 01/25/2014   CREATININE 1.38* 01/25/2014  Decreased from 1.8. Not on ACEI.  - last set of lipids: Lab Results  Component Value Date   CHOL 180 08/04/2013   HDL 35* 08/04/2013   LDLCALC 99 08/04/2013   TRIG 230* 08/04/2013   CHOLHDL 5.1 08/04/2013  On Zetia and Lipitor. Takes 1 ASA a day (81 mg) - last eye exam was in 08/2013. No DR reportedly. She has mild glaucoma.  - + numbness and tingling in her feet. She has PN >> takes gabapentin 400 mg 1-2x a day.   I reviewed pt's medications, allergies, PMH, social hx, family hx, and changes were documented in the history of present illness. Otherwise, unchanged from my initial visit note..   ROS: Constitutional: no weight gain, no fatigue, + hot flushes, + poor sleep Eyes: no blurry vision, no xerophthalmia ENT: no sore throat, no nodules palpated in throat, no dysphagia/odynophagia, no  hoarseness, + tinnitus, + hypoacusis Cardiovascular: + CP/+ SOB/no palpitations/leg swelling Respiratory: no cough/+ SOB/+ wheezing Gastrointestinal: no N/V/D/+ C, no heartburn Musculoskeletal: + both: muscle aches/joint aches Skin: no rash, no itching Neurological: no tremors/numbness/tingling/dizziness, no HA  PE: BP 108/82 mmHg  Pulse 72  Temp(Src) 97.9 F (36.6 C) (Oral)  Resp 12  Wt 242 lb (109.77 kg)  SpO2 95% Body mass index is 34.22 kg/(m^2).  Wt Readings from Last 3 Encounters:  06/09/14 242 lb (109.77 kg)  05/16/14 245 lb 6.4 oz (111.313 kg)  05/03/14 243 lb (110.224 kg)   Constitutional: obese, in NAD Eyes: PERRLA, EOMI, no exophthalmos ENT: moist mucous membranes, no thyromegaly, no cervical lymphadenopathy Cardiovascular: RRR, No MRG Respiratory: CTA B Gastrointestinal: abdomen soft, NT, ND,  BS+ Musculoskeletal: no deformities, strength intact in all 4 Skin: moist, warm, no rashes, + hirsutism on chin Neurological: no tremor with outstretched hands, DTR normal in all 4  ASSESSMENT: 1. DM2, insulin-dependent, uncontrolled, with complications - CKD - iCMP, CAD - s/p CABG x 6, 2002, s/p stent 2006, s/p stent 09/2013 - Dr. Sallyanne Kuster - PN  2. CP  PLAN:  1. Patient with long-standing, uncontrolled diabetes, on basal-bolus insulin regimen, with improvement in her sugars after stopping eating cookies, but still some 200s when she forgets insulin. Also, she does not take her mealtime insulin if sugars <150! I gave her a reverse SSI (see instructions). - Will hold off U500 insulin for now, will try to switch to Toujeo instead of Lantus since she is using large doses  Patient Instructions  Please stop Lantus and start Toujeo 46 units 2x a day. Please continue Novolog: 35 units before breakfast 25 units before lunch 40 units before dinner Continue sliding scale of NovoLog: - 150- 165: + 1 unit  - 166- 180: + 2 units  - 181- 195: + 3 units  - 196- 210: + 4 units  - 211- 225: + 5 units              - >225: + 6 units   If sugars before a meal are 80-150: take the whole amount of mealtime insulin with that meal, but not the Sliding scale  If sugars are 60-80, take only half of the mealtime insulin and no sliding scale.  If sugars <60, do not take any insulin with that meal.  Please come back in 2 months with your sugar log.  - continue checking sugars at different times of the day - check 3 times a day, rotating checks - up to date with yearly eye exams - had flu vaccine this season - will check HbA1c at next visit - Return to clinic in 2 mo with sugar log   2. CP - not new - uses NTG with relief - knows she needs a new stent - tried to schedule appt with Dr Sallyanne Kuster but this had to be postponed as he will be out of town - advised her that she may need to schedule an appt  sooner with one of his colleagues

## 2014-06-09 NOTE — Patient Instructions (Signed)
Please stop Lantus and start Toujeo 46 units 2x a day. Please continue Novolog: 35 units before breakfast 25 units before lunch 40 units before dinner Continue sliding scale of NovoLog: - 150- 165: + 1 unit  - 166- 180: + 2 units  - 181- 195: + 3 units  - 196- 210: + 4 units  - 211- 225: + 5 units              - >225: + 6 units   If sugars before a meal are 80-150: take the whole amount of mealtime insulin with that meal, but not the Sliding scale  If sugars are 60-80, take only half of the mealtime insulin and no sliding scale.  If sugars <60, do not take any insulin with that meal.  Please come back in 2 months with your sugar log.

## 2014-06-10 ENCOUNTER — Encounter: Payer: Self-pay | Admitting: Podiatrist

## 2014-06-10 ENCOUNTER — Ambulatory Visit (INDEPENDENT_AMBULATORY_CARE_PROVIDER_SITE_OTHER): Payer: Medicare Other | Admitting: Podiatrist

## 2014-06-10 ENCOUNTER — Telehealth: Payer: Self-pay | Admitting: *Deleted

## 2014-06-10 VITALS — BP 189/69 | HR 86 | Resp 12

## 2014-06-10 DIAGNOSIS — M2040 Other hammer toe(s) (acquired), unspecified foot: Secondary | ICD-10-CM | POA: Diagnosis not present

## 2014-06-10 DIAGNOSIS — M79676 Pain in unspecified toe(s): Secondary | ICD-10-CM | POA: Diagnosis not present

## 2014-06-10 DIAGNOSIS — B351 Tinea unguium: Secondary | ICD-10-CM

## 2014-06-10 DIAGNOSIS — M21611 Bunion of right foot: Secondary | ICD-10-CM

## 2014-06-10 DIAGNOSIS — E1140 Type 2 diabetes mellitus with diabetic neuropathy, unspecified: Secondary | ICD-10-CM | POA: Diagnosis not present

## 2014-06-10 DIAGNOSIS — M2011 Hallux valgus (acquired), right foot: Secondary | ICD-10-CM

## 2014-06-10 NOTE — Telephone Encounter (Signed)
Request sent from [facility] to hold Plavix for a colonoscopy.  Faxed response from Dr. Loletha Grayer back - please delay colonoscopy until June 2016 (Received drug-eluting stent June 2015).

## 2014-06-10 NOTE — Patient Instructions (Signed)
DIABETIC SHOES-  Diabetic shoes and accomidative inserts are indicated for those persons who have Diabetes and who are at an increased for a foot ulceration.  This requires patients to have decreased feeling in the feet, circulation problems, as well as an abnormal foot structure, or a combination of these disorders.  As podiatrists, we can recommend a diabetic shoe to your primary care doctor or endocrinologist HOWEVER, it is up to them to decide if you are eligible or in need of these shoes.  Before we can take measurements or do the ordering of a diabetic shoe and accomidative insert, it is required that we get a signed authorization from your physician both confirming you have Diabetes and that you are at risk for an ulceration.  Please note, if you physician will not sign for the shoes, there is no way we can go around him or her.  You are welcome to place an order for the shoes, however the financial responsibility is up to you and your insurance cannot be billed.   Once, approval is granted for the diabetic shoes and inserts we will make you a separate appointment to be measured, casted, and for you to choose the diabetic shoe.  We will then call you when they are ready for dispensing and you will be seen to make sure the shoes and inserts fit your foot properly.  As this is a multi-step process, it generally takes 4-8 weeks from start to finish.  You cooperation is appreciated.  If it has been more than 6 weeks from the date you ordered your diabetic shoe and you have not heard from our office, please give [place] a call.      Diabetes and Foot Care Diabetes may cause you to have problems because of poor blood supply (circulation) to your feet and legs. This may cause the skin on your feet to become thinner, break easier, and heal more slowly. Your skin may become dry, and the skin may peel and crack. You may also have nerve damage in your legs and feet causing decreased feeling in them. You may not  notice minor injuries to your feet that could lead to infections or more serious problems. Taking care of your feet is one of the most important things you can do for yourself.  HOME CARE INSTRUCTIONS  Wear shoes at all times, even in the house. Do not go barefoot. Bare feet are easily injured.  Check your feet daily for blisters, cuts, and redness. If you cannot see the bottom of your feet, use a mirror or ask someone for help.  Wash your feet with warm water (do not use hot water) and mild soap. Then pat your feet and the areas between your toes until they are completely dry. Do not soak your feet as this can dry your skin.  Apply a moisturizing lotion or petroleum jelly (that does not contain alcohol and is unscented) to the skin on your feet and to dry, brittle toenails. Do not apply lotion between your toes.  Trim your toenails straight across. Do not dig under them or around the cuticle. File the edges of your nails with an emery board or nail file.  Do not cut corns or calluses or try to remove them with medicine.  Wear clean socks or stockings every day. Make sure they are not too tight. Do not wear knee-high stockings since they may decrease blood flow to your legs.  Wear shoes that fit properly and have enough  cushioning. To break in new shoes, wear them for just a few hours a day. This prevents you from injuring your feet. Always look in your shoes before you put them on to be sure there are no objects inside.  Do not cross your legs. This may decrease the blood flow to your feet.  If you find a minor scrape, cut, or break in the skin on your feet, keep it and the skin around it clean and dry. These areas may be cleansed with mild soap and water. Do not cleanse the area with peroxide, alcohol, or iodine.  When you remove an adhesive bandage, be sure not to damage the skin around it.  If you have a wound, look at it several times a day to make sure it is healing.  Do not use  heating pads or hot water bottles. They may burn your skin. If you have lost feeling in your feet or legs, you may not know it is happening until it is too late.  Make sure your health care provider performs a complete foot exam at least annually or more often if you have foot problems. Report any cuts, sores, or bruises to your health care provider immediately. SEEK MEDICAL CARE IF:   You have an injury that is not healing.  You have cuts or breaks in the skin.  You have an ingrown nail.  You notice redness on your legs or feet.  You feel burning or tingling in your legs or feet.  You have pain or cramps in your legs and feet.  Your legs or feet are numb.  Your feet always feel cold. SEEK IMMEDIATE MEDICAL CARE IF:   There is increasing redness, swelling, or pain in or around a wound.  There is a red line that goes up your leg.  Pus is coming from a wound.  You develop a fever or as directed by your health care provider.  You notice a bad smell coming from an ulcer or wound. Document Released: 03/29/2000 Document Revised: 12/02/2012 Document Reviewed: 09/08/2012 [facility] Patient Information 2015 [place], [place]. This information is not intended to replace advice given to you by your health care provider. Make sure you discuss any questions you have with your health care provider.

## 2014-06-10 NOTE — Progress Notes (Signed)
   Subjective:    Patient ID: Brooke Stafford, female    DOB: September 27, 1955, 59 y.o.   MRN: [id number]  HPI TOENAILS TRIM AND CHECK DIABETIC FEET.  ALSO, WANTS PAIR DIABETIC SHOES.   Review of Systems  All other systems reviewed and are negative.      Objective:   Physical Exam Patient is awake, alert, and oriented x 3.  In no acute distress.  Vascular status is intact with palpable pedal pulses at 2/4 DP and PT bilateral and capillary refill time within normal limits. Neurological sensation is decreased bilaterally via Semmes Weinstein monofilament at 3/5 sites. Light touch, vibratory sensation, Achilles tendon reflex is intact. Dermatological exam reveals skin color, turger and texture as normal. No open lesions present.  Musculature intact with dorsiflexion, plantarflexion, inversion, eversion. High arches are noted bilateral, contracture hammertoes 2-5 bilateral noted.  Bunion deformity is also present bilateral.  All toenails x 10 are thick, discolored, distrophic and clinically mycotic as well as painful with palpation and pressure.       Assessment & Plan:   Neuropathy, symptomatic mycotic toenails, bunion, hammertoes  Plan:  Diabetic foot evaluation carried out today.  Debridement of toenails accomplished.  Recommended diabetic shoes due to her orthopedic issues and risk for ulceration due to neuropathy.  Will call when ready to measure for shoes.

## 2014-06-20 ENCOUNTER — Ambulatory Visit: Payer: Medicare Other | Admitting: Cardiovascular Disease

## 2014-06-22 ENCOUNTER — Telehealth: Payer: Self-pay | Admitting: General Practice

## 2014-06-22 ENCOUNTER — Telehealth: Payer: Self-pay | Admitting: Emergency Medicine

## 2014-06-22 DIAGNOSIS — N183 Chronic kidney disease, stage 3 (moderate): Secondary | ICD-10-CM | POA: Diagnosis not present

## 2014-06-22 DIAGNOSIS — I129 Hypertensive chronic kidney disease with stage 1 through stage 4 chronic kidney disease, or unspecified chronic kidney disease: Secondary | ICD-10-CM | POA: Diagnosis not present

## 2014-06-22 DIAGNOSIS — I2510 Atherosclerotic heart disease of native coronary artery without angina pectoris: Secondary | ICD-10-CM | POA: Diagnosis not present

## 2014-06-22 DIAGNOSIS — E1129 Type 2 diabetes mellitus with other diabetic kidney complication: Secondary | ICD-10-CM | POA: Diagnosis not present

## 2014-06-23 ENCOUNTER — Other Ambulatory Visit: Payer: Self-pay | Admitting: Emergency Medicine

## 2014-06-23 MED ORDER — ATORVASTATIN CALCIUM 40 MG PO TABS: 40.0000 mg | ORAL_TABLET | Freq: Every day | ORAL | Status: DC

## 2014-06-24 ENCOUNTER — Ambulatory Visit (HOSPITAL_COMMUNITY): Payer: Medicare Other

## 2014-06-28 ENCOUNTER — Telehealth (HOSPITAL_COMMUNITY): Payer: Self-pay | Admitting: Cardiovascular Disease

## 2014-06-28 NOTE — Telephone Encounter (Signed)
Received records from [facility] for appointment on 07/13/14 with Dr Sallyanne Kuster.  Records given to [facility] (medical records) for Dr Croitoru's schedule on 30/30/16. lp

## 2014-06-29 NOTE — Progress Notes (Signed)
Patient ID: Brooke Stafford, female   DOB: [birth year], 59 y.o.   MRN: [id number]   Brooke Stafford, is a 59 y.o. female  [id number]  [id number]  DOB - 06-15-1955  Chief Complaint  Patient presents with  . Follow-up        Subjective:   Brooke Stafford is a 59 y.o. female here today for a follow up visit. Patient has past medical history of extensive cardiac history, hypertension, diabetes, hypercholesterolemia  here today for routine follow-up. Last visit here pt was sent to the ED with chest pain and an abnormal ECG. Pt was instructed to return to the clinic after she was discharged from the ED. Patient ruled out of MI. Patient has no new complaint today. Patient has No headache, No chest pain, No abdominal pain - No Nausea, No new weakness tingling or numbness, No Cough - SOB.  No problems updated.  ALLERGIES: Allergies  Allergen Reactions  . Hydralazine Shortness Of Breath  . Penicillins Cross Reactors Hives    And high fever  . Adhesive [Tape] Rash    bruising    PAST MEDICAL HISTORY: Past Medical History  Diagnosis Date  . Asthma   . Anxiety   . GERD (gastroesophageal reflux disease)   . Coronary artery disease 2002    CABG x 6. Cath 5/11- med Rx  . Hypertension   . Peripheral vascular disease 12/12    LSFA PTA  . Anemia   . Hyperlipidemia   . Chronic renal insufficiency, stage II (mild)     followed  by [facility]  . CAD (coronary artery disease) 2002; 2015    CABG x 6 2002, cath 2011- med Rx stent DES VG-Diag  . Hypothyroid     treated  . Obesity (BMI 35.0-39.9 without comorbidity)   . CHF (congestive heart failure)     "in 2002" (11/26/2012)  . Myocardial infarction 2000; 2002; 2011?  [name] Chronic bronchitis     "q year; in the winter" (08/04/2013)  . Type II diabetes mellitus   . Depression   . Pneumonia     "3 times I think" (08/04/2013)  . History of blood transfusion 2002    "when I had OHS"  . Migraines     "couple times/year" (08/04/2013)   . Headache     "~ q week" (08/04/2013)  . Arthritis     "stiff fingers and knees" (08/04/2013)  . Gout     "right big toe"  . Anginal pain   . CAD (coronary artery disease) of artery bypass graft; DES to VG-Diag 09/28/13 11/09/2013    MEDICATIONS AT HOME: Prior to Admission medications   Medication Sig Start Date End Date Taking? Authorizing Provider  albuterol (PROVENTIL HFA;VENTOLIN HFA) 108 (90 BASE) MCG/ACT inhaler Inhale 2 puffs into the lungs every 6 (six) hours as needed for wheezing or shortness of breath.   Yes Historical Provider, MD  allopurinol (ZYLOPRIM) 300 MG tablet Take 300 mg by mouth daily.    Yes Historical Provider, MD  aspirin 81 MG chewable tablet Chew 1 tablet (81 mg total) by mouth daily. 09/29/13  Yes Brittainy Erie Noe, PA-C  ezetimibe (ZETIA) 10 MG tablet Take 1 tablet (10 mg total) by mouth daily. 08/30/13  Yes Brittainy Erie Noe, PA-C  fluticasone (FLONASE) 50 MCG/ACT nasal spray Place 2 sprays into both nostrils daily as needed for allergies or rhinitis.   Yes Historical Provider, MD  gabapentin (NEURONTIN) 400 MG capsule Take 1 capsule (400 mg total) by  mouth 3 (three) times daily. 01/27/14  Yes Tresa Garter, MD  Insulin Pen Needle 32G X 4 MM MISC Use to inject insulin daily as instructed. 12/07/13  Yes Philemon Kingdom, MD  isosorbide mononitrate (IMDUR) 60 MG 24 hr tablet Take 1.5 tablets (90 mg total) by mouth daily. 01/25/14  Yes Brett Canales, PA-C  loratadine (CLARITIN) 10 MG tablet Take 1 tablet (10 mg total) by mouth daily. 09/21/13  Yes Lorayne Marek, MD  Menthol-Camphor (TIGER BALM ARTHRITIS RUB) 11-11 % CREA Apply 1 application topically 2 (two) times daily.   Yes Historical Provider, MD  metoprolol succinate (TOPROL-XL) 50 MG 24 hr tablet Take 3 tablets (150 mg total) by mouth daily. Take with or immediately following a meal. 01/25/14  Yes Brett Canales, PA-C  nitroGLYCERIN (NITROSTAT) 0.4 MG SL tablet Place 1 tablet (0.4 mg total) under the tongue  every 5 (five) minutes as needed. For chest 08/30/13  Yes Brittainy Erie Noe, PA-C  polyethylene glycol (MIRALAX / GLYCOLAX) packet Take 17 g by mouth daily as needed for moderate constipation.   Yes Historical Provider, MD  acetaminophen-codeine (TYLENOL #3) 300-30 MG per tablet Take 1 tablet by mouth every 4 (four) hours as needed. 05/16/14   Tresa Garter, MD  atorvastatin (LIPITOR) 40 MG tablet Take 1 tablet (40 mg total) by mouth daily. 06/23/14   Tresa Garter, MD  cloNIDine (CATAPRES) 0.2 MG tablet Take 1 tablet (0.2 mg total) by mouth 2 (two) times daily. 05/16/14   Tresa Garter, MD  clopidogrel (PLAVIX) 75 MG tablet Take 1 tablet (75 mg total) by mouth daily. 03/16/14   Tresa Garter, MD  cyclobenzaprine (FLEXERIL) 10 MG tablet Take 1 tablet (10 mg total) by mouth 3 (three) times daily as needed for muscle spasms. 01/27/14   Tresa Garter, MD  hydrocortisone cream 0.5 % Apply 1 application topically 2 (two) times daily. 01/27/14   Tresa Garter, MD  insulin aspart (NOVOLOG FLEXPEN) 100 UNIT/ML FlexPen Inject 25-45 Units into the skin 3 (three) times daily with meals. Inject 35 units subq at breakfast, inject 25 units subq at lunch and inject 40 units subq at supper. 05/30/14   Philemon Kingdom, MD  insulin glargine (LANTUS) 100 UNIT/ML injection Inject 0.42 mLs (42 Units total) into the skin 2 (two) times daily. 05/03/14   Philemon Kingdom, MD  Insulin Glargine (TOUJEO SOLOSTAR) 300 UNIT/ML SOPN Inject 92 Units into the skin daily. 06/09/14   Philemon Kingdom, MD  Insulin Syringes, Disposable, (B-D INSULIN SYRINGE 1CC) U-100 1 ML MISC Use to inject insulin 4 times daily as instructed. 02/22/14   Philemon Kingdom, MD  levothyroxine (SYNTHROID, LEVOTHROID) 25 MCG tablet Take 1 tablet (25 mcg total) by mouth daily before breakfast. 05/03/14   Tresa Garter, MD  traZODone (DESYREL) 50 MG tablet Take 0.5 tablets (25 mg total) by mouth at bedtime. 05/16/14   Tresa Garter, MD     Objective:   Filed Vitals:   01/27/14 1452  BP: 180/104  Pulse: 72  Temp: 97.6 F (36.4 C)  TempSrc: Oral  Resp: 14  Height: 5' 10.5" (1.791 m)  Weight: 248 lb (112.492 kg)  SpO2: 97%    Exam General appearance : Awake, alert, not in any distress. Speech Clear. Not toxic looking HEENT: Atraumatic and Normocephalic, pupils equally reactive to light and accomodation Neck: supple, no JVD. No cervical lymphadenopathy.  Chest:Good air entry bilaterally, no added sounds  CVS: S1 S2 regular, no  murmurs.  Abdomen: Bowel sounds present, Non tender and not distended with no gaurding, rigidity or rebound. Extremities: B/L Lower Ext shows no edema, both legs are warm to touch Neurology: Awake alert, and oriented X 3, CN II-XII intact, Non focal  Assessment & Plan   1. Essential hypertension  - cloNIDine (CATAPRES) tablet 0.1 mg; Take 1 tablet (0.1 mg total) by mouth once.   - We have discussed target BP range and blood pressure goal - I have advised patient to check BP regularly and to call us back or report to clinic if the numbers are consistently higher than 140/90  - We discussed the importance of compliance with medical therapy and DASH diet recommended, consequences of uncontrolled hypertension discussed.  - continue current BP medications  2. Diabetic neuropathy, type II diabetes mellitus  - gabapentin (NEURONTIN) 400 MG capsule; Take 1 capsule (400 mg total) by mouth 3 (three) times daily.  Dispense: 270 capsule; Refill: 3 - cyclobenzaprine (FLEXERIL) 10 MG tablet; Take 1 tablet (10 mg total) by mouth 3 (three) times daily as needed for muscle spasms.  Dispense: 60 tablet; Refill: 3   Aim for 2-3 Carb Choices per meal (30-45 grams) +/- 1 either way  Aim for 0-15 Carbs per snack if hungry  Include protein in moderation with your meals and snacks  Consider reading food labels for Total Carbohydrate and Fat Grams of foods  Consider checking BG at alternate  times per day  Continue taking medication as directed Fruit Punch - find one with no sugar  Measure and decrease portions of carbohydrate foods  Make your plate and don't go back for seconds  3. Atopic eczema  - hydrocortisone cream 0.5 %; Apply 1 application topically 2 (two) times daily.  Dispense: 30 g; Refill: 0   Patient have been counseled extensively about nutrition and exercise  Return in about 1 week (around 02/03/2014) for BP Check, Nurse Visit.  The patient was given clear instructions to go to ER or return to medical center if symptoms don't improve, worsen or new problems develop. The patient verbalized understanding. The patient was told to call to get lab results if they haven't heard anything in the next week.   This note has been created with Surveyor, quantity. Any transcriptional errors are unintentional.    Angelica Chessman, MD, [place], [place], [place] and [facility], [place]

## 2014-07-01 ENCOUNTER — Ambulatory Visit (HOSPITAL_COMMUNITY)
Admission: RE | Admit: 2014-07-01 | Discharge: 2014-07-01 | Disposition: A | Discharge status: Home / Self Care | Payer: Medicare Other | Source: Ambulatory Visit | Attending: Internal Medicine | Admitting: Internal Medicine

## 2014-07-01 ENCOUNTER — Other Ambulatory Visit: Payer: Self-pay | Admitting: Internal Medicine

## 2014-07-01 DIAGNOSIS — Z1231 Encounter for screening mammogram for malignant neoplasm of breast: Secondary | ICD-10-CM | POA: Diagnosis not present

## 2014-07-01 LAB — HM MAMMOGRAPHY

## 2014-07-07 ENCOUNTER — Telehealth: Payer: Self-pay | Admitting: Emergency Medicine

## 2014-07-13 ENCOUNTER — Encounter: Payer: Self-pay | Admitting: Cardiovascular Disease

## 2014-07-13 ENCOUNTER — Ambulatory Visit (INDEPENDENT_AMBULATORY_CARE_PROVIDER_SITE_OTHER): Payer: Medicare Other | Admitting: Cardiovascular Disease

## 2014-07-13 VITALS — BP 156/88 | HR 68 | Resp 16 | Ht 70.5 in | Wt 236.8 lb

## 2014-07-13 DIAGNOSIS — I255 Ischemic cardiomyopathy: Secondary | ICD-10-CM | POA: Diagnosis not present

## 2014-07-13 DIAGNOSIS — I10 Essential (primary) hypertension: Secondary | ICD-10-CM

## 2014-07-13 DIAGNOSIS — Z951 Presence of aortocoronary bypass graft: Secondary | ICD-10-CM

## 2014-07-13 DIAGNOSIS — I70209 Unspecified atherosclerosis of native arteries of extremities, unspecified extremity: Secondary | ICD-10-CM | POA: Diagnosis not present

## 2014-07-13 DIAGNOSIS — E1122 Type 2 diabetes mellitus with diabetic chronic kidney disease: Secondary | ICD-10-CM

## 2014-07-13 DIAGNOSIS — R079 Chest pain, unspecified: Secondary | ICD-10-CM

## 2014-07-13 DIAGNOSIS — Z79899 Other long term (current) drug therapy: Secondary | ICD-10-CM | POA: Diagnosis not present

## 2014-07-13 DIAGNOSIS — I25810 Atherosclerosis of coronary artery bypass graft(s) without angina pectoris: Secondary | ICD-10-CM | POA: Diagnosis not present

## 2014-07-13 DIAGNOSIS — E785 Hyperlipidemia, unspecified: Secondary | ICD-10-CM

## 2014-07-13 DIAGNOSIS — N189 Chronic kidney disease, unspecified: Secondary | ICD-10-CM

## 2014-07-13 NOTE — Progress Notes (Signed)
Patient ID: Brooke Stafford, female   DOB: 09-08-1955, 59 y.o.   MRN: [id number]     Cardiology Office Note   Date:  07/15/2014   ID:  Brooke Stafford, Brooke Stafford 1955-04-17, MRN [id number]  PCP:  Angelica Chessman, MD  Cardiologist:   Sanda Klein, MD   Chief Complaint  Patient presents with  . Follow-up    6 months. chest pain with activity or "when it rains" dizziness on occasion      History of Present Illness: Brooke Stafford is a 59 y.o. female who presents for follow up of CAD, CHF and HTN  She has a past medical history significant for severe type 2 diabetes mellitus requiring insulin therapy, systemic hypertension, hyperlipidemia, obesity, PVD S/P LSFA PTA initally in 2012 and again in 2014, coronary artery disease, S/P CABG x 6 in 2002. In July 2011 when she presented with sepsis and medication non compliance and had a NSTEMI from demand ischemia. Cath then showed a patent LIMA to LAD, patent SVG to diagonal, and patent sequential SVG to branches of the right coronary artery, chronic occlusion of SVG-ramus intermedius. She also has a mild ischemic cardiomyopathy with a left ventricular ejection fraction of 45% to 50% by echo 5/11. June 2015 presented with crescendo angina and received a stent to the SVG to diagonal artery  (3.5 x 23 mm Xience [place]) .   Her nephrologist recommended she replace amlodipine 10 mg with Lotrel 5-10 mg daily. She filled the Rx, but has not yet started it, although she stopped the old amlodipine Rx.  She describes CCS class 2 angina (with increased activity, especially noticeable when the weather is rainy), rare dizziness, no exertional dyspnea and no edema.   Past Medical History  Diagnosis Date  . Asthma   . Anxiety   . GERD (gastroesophageal reflux disease)   . Coronary artery disease 2002    CABG x 6. Cath 5/11- med Rx  . Hypertension   . Peripheral vascular disease 12/12    LSFA PTA  . Anemia   . Hyperlipidemia   . Chronic renal  insufficiency, stage II (mild)     followed  by [facility]  . CAD (coronary artery disease) 2002; 2015    CABG x 6 2002, cath 2011- med Rx stent DES VG-Diag  . Hypothyroid     treated  . Obesity (BMI 35.0-39.9 without comorbidity)   . CHF (congestive heart failure)     "in 2002" (11/26/2012)  . Myocardial infarction 2000; 2002; 2011?  [name] Chronic bronchitis     "q year; in the winter" (08/04/2013)  . Type II diabetes mellitus   . Depression   . Pneumonia     "3 times I think" (08/04/2013)  . History of blood transfusion 2002    "when I had OHS"  . Migraines     "couple times/year" (08/04/2013)  . Headache     "~ q week" (08/04/2013)  . Arthritis     "stiff fingers and knees" (08/04/2013)  . Gout     "right big toe"  . Anginal pain   . CAD (coronary artery disease) of artery bypass graft; DES to VG-Diag 09/28/13 11/09/2013    Past Surgical History  Procedure Laterality Date  . Cholecystectomy  1982  . Cesarean section  1978; 1980  . Tubal ligation  1980  . Breast cyst excision Right 1970's  . Coronary artery bypass graft  11/20/2000    x6 LIMA to distal LAD, svg to first  diag, svg to ramus intermediate branch and swquential SVG to cir marginal branch, SVG to posterior descending coronary and sequential SVG to first right posterolateral branch  . Cardiac catheterization  2002  . Coronary angioplasty with stent placement  2004; 2012    "I have 2 stents" (08/04/2013)  . Peripheral arterial stent graft Left     SFA/notes 04/07/2011 (11/30/2012)  . Nm myocar perf wall motion  08/27/2004    negative  . Appendectomy  1980  . Coronary angioplasty with stent placement  09/28/13    PTCA/ DES Xience stent to VG-Diag   . Abdominal aortagram N/A 04/05/2011    Procedure: ABDOMINAL AORTAGRAM;  Surgeon: Lorretta Harp, MD;  Location: [facility] CATH LAB;  Service: Cardiovascular;  Laterality: N/A;  . Renal angiogram N/A 04/05/2011    Procedure: RENAL ANGIOGRAM;  Surgeon: Lorretta Harp, MD;   Location: [facility] CATH LAB;  Service: Cardiovascular;  Laterality: N/A;  . Lower extremity angiogram  12/01/2012    Procedure: LOWER EXTREMITY ANGIOGRAM;  Surgeon: Lorretta Harp, MD;  Location: [facility] CATH LAB;  Service: Cardiovascular;;  . Percutaneous stent intervention Left 12/01/2012    Procedure: PERCUTANEOUS STENT INTERVENTION;  Surgeon: Lorretta Harp, MD;  Location: [facility] CATH LAB;  Service: Cardiovascular;  Laterality: Left;  Left SFA  . Left heart catheterization with coronary/graft angiogram N/A 09/28/2013    Procedure: LEFT HEART CATHETERIZATION WITH Beatrix Fetters;  Surgeon: Peter M Martinique, MD;  Location: [facility] CATH LAB;  Service: Cardiovascular;  Laterality: N/A;     Current Outpatient Prescriptions  Medication Sig Dispense Refill  . acetaminophen-codeine (TYLENOL #3) 300-30 MG per tablet Take 1 tablet by mouth every 4 (four) hours as needed. 60 tablet 0  . albuterol (PROVENTIL HFA;VENTOLIN HFA) 108 (90 BASE) MCG/ACT inhaler Inhale 2 puffs into the lungs every 6 (six) hours as needed for wheezing or shortness of breath.    . allopurinol (ZYLOPRIM) 300 MG tablet Take 300 mg by mouth daily.     [name] aspirin 81 MG chewable tablet Chew 1 tablet (81 mg total) by mouth daily.    [name] atorvastatin (LIPITOR) 40 MG tablet Take 1 tablet (40 mg total) by mouth daily. 90 tablet 3  . cloNIDine (CATAPRES) 0.2 MG tablet Take 1 tablet (0.2 mg total) by mouth 2 (two) times daily. 180 tablet 3  . clopidogrel (PLAVIX) 75 MG tablet Take 1 tablet (75 mg total) by mouth daily. 30 tablet 6  . cyclobenzaprine (FLEXERIL) 10 MG tablet Take 1 tablet (10 mg total) by mouth 3 (three) times daily as needed for muscle spasms. 60 tablet 3  . ezetimibe (ZETIA) 10 MG tablet Take 1 tablet (10 mg total) by mouth daily. 90 tablet 3  . fluticasone (FLONASE) 50 MCG/ACT nasal spray Place 2 sprays into both nostrils daily as needed for allergies or rhinitis.    [name] gabapentin (NEURONTIN) 400 MG capsule Take 1 capsule (400 mg total)  by mouth 3 (three) times daily. 270 capsule 3  . hydrocortisone cream 0.5 % Apply 1 application topically 2 (two) times daily. 30 g 0  . insulin aspart (NOVOLOG FLEXPEN) 100 UNIT/ML FlexPen Inject 25-45 Units into the skin 3 (three) times daily with meals. Inject 35 units subq at breakfast, inject 25 units subq at lunch and inject 40 units subq at supper. 30 mL 2  . insulin glargine (LANTUS) 100 UNIT/ML injection Inject 0.42 mLs (42 Units total) into the skin 2 (two) times daily. 20 mL 1  . Insulin Glargine (TOUJEO  SOLOSTAR) 300 UNIT/ML SOPN Inject 92 Units into the skin daily. 6 pen 2  . Insulin Pen Needle 32G X 4 MM MISC Use to inject insulin daily as instructed. 150 each 11  . Insulin Syringes, Disposable, (B-D INSULIN SYRINGE 1CC) U-100 1 ML MISC Use to inject insulin 4 times daily as instructed. 160 each 4  . isosorbide mononitrate (IMDUR) 60 MG 24 hr tablet Take 1.5 tablets (90 mg total) by mouth daily. 60 tablet 5  . levothyroxine (SYNTHROID, LEVOTHROID) 25 MCG tablet take 1 tablet by mouth daily BEFORE BREAKFAST 30 tablet 1  . loratadine (CLARITIN) 10 MG tablet Take 1 tablet (10 mg total) by mouth daily. 30 tablet 11  . Menthol-Camphor (TIGER BALM ARTHRITIS RUB) 11-11 % CREA Apply 1 application topically 2 (two) times daily.    . metoprolol succinate (TOPROL-XL) 50 MG 24 hr tablet Take 3 tablets (150 mg total) by mouth daily. Take with or immediately following a meal. 90 tablet 5  . nitroGLYCERIN (NITROSTAT) 0.4 MG SL tablet Place 1 tablet (0.4 mg total) under the tongue every 5 (five) minutes as needed. For chest 30 tablet 6  . polyethylene glycol (MIRALAX / GLYCOLAX) packet Take 17 g by mouth daily as needed for moderate constipation.    . traZODone (DESYREL) 50 MG tablet Take 0.5 tablets (25 mg total) by mouth at bedtime. 45 tablet 3  . amLODipine-benazepril (LOTREL) 5-10 MG per capsule Take 1 capsule by mouth daily.     No current facility-administered medications for this visit.     Allergies:   Hydralazine; Penicillins cross reactors; and Adhesive    Social History:  The patient  reports that she quit smoking about 14 years ago. Her smoking use included Cigarettes. She has a 25 pack-year smoking history. She has never used smokeless tobacco. She reports that she drinks alcohol. She reports that she does not use illicit drugs.   Family History:  The patient's family history includes Diabetes in her mother and sister; Hyperlipidemia in her sister; Hypertension in her brother, father, mother, and sister; Stroke in her mother.    ROS:  Please see the history of present illness.    Otherwise, review of systems positive for none.   All other systems are reviewed and negative.    PHYSICAL EXAM: VS:  BP 156/88 mmHg  Pulse 68  Resp 16  Ht 5' 10.5" (1.791 m)  Wt 236 lb 12.8 oz (107.412 kg)  BMI 33.49 kg/m2  LMP 02/28/2013 , BMI Body mass index is 33.49 kg/(m^2).  General: Alert, oriented x3, no distress Head: no evidence of trauma, PERRL, EOMI, no exophtalmos or lid lag, no myxedema, no xanthelasma; normal ears, nose and oropharynx Neck: normal jugular venous pulsations and no hepatojugular reflux; brisk carotid pulses without delay and no carotid bruits Chest: clear to auscultation, no signs of consolidation by percussion or palpation, normal fremitus, symmetrical and full respiratory excursions Cardiovascular: normal position and quality of the apical impulse, regular rhythm, normal first and second heart sounds, no murmurs, rubs or gallops Abdomen: no tenderness or distention, no masses by palpation, no abnormal pulsatility or arterial bruits, normal bowel sounds, no hepatosplenomegaly Extremities: no clubbing, cyanosis or edema; 2+ radial, ulnar and brachial pulses bilaterally; 2+ right femoral, posterior tibial and dorsalis pedis pulses; 2+ left femoral, posterior tibial and dorsalis pedis pulses; no subclavian or femoral bruits Neurological: grossly  nonfocal Psych: euthymic mood, full affect   EKG:  EKG is ordered today. The ekg ordered today demonstrates NSR  with repolarization changes suggestive of LVH (voltage masked by obesity)   Recent Labs: 09/28/2013: TSH 5.410* 11/08/2013: Magnesium 1.7 11/09/2013: ALT 34 01/25/2014: BUN 19; Creatinine 1.38*; Hemoglobin 13.7; Platelets 172; Potassium 4.2; Pro B Natriuretic peptide (BNP) 1369.0*; Sodium 142    Lipid Panel    Component Value Date/Time   CHOL 180 08/04/2013 0430   TRIG 230* 08/04/2013 0430   HDL 35* 08/04/2013 0430   CHOLHDL 5.1 08/04/2013 0430   VLDL 46* 08/04/2013 0430   LDLCALC 99 08/04/2013 0430      Wt Readings from Last 3 Encounters:  07/13/14 236 lb 12.8 oz (107.412 kg)  06/09/14 242 lb (109.77 kg)  05/16/14 245 lb 6.4 oz (111.313 kg)      ASSESSMENT AND PLAN:  Ischemic cardiomyopathy Mildly Depressed LVEF. No current signs of acute heart failure.  S/P CABG (coronary artery bypass graft) 2002 Patent LIMA to LAD, patent SVG to PDA, occluded SVG to ramus intermedius, stent in SVG to diagonal June 2015. Currently CCS class 2 (mild and infrequent) angina. Barring bleeding problems, recommend lifelong dual antiplatelet therapy since she is at high risk of recurrent events.   Essential hypertension, benign BP is high. I agree with the prescription for Lotrel. Reviewed the high-risk of complications with clonidine rebound or beta blocker rebound.  Chronic kidney disease stage 2 Creatinine is much better than last July, good opportunity to start ACEi  Diabetes mellitus, type 2 insulin requiring Last A1c shows deterioration in glycemic control, after she had made substantial improvement   Current medicines are reviewed at length with the patient today.  The patient has concerns regarding medicines. We addressed those.  The following changes have been made:  Start prescribed LOTREL 5-10 mg ASAP.  Labs/ tests ordered today include:  Orders Placed This  Encounter  Procedures  . Basic metabolic panel  . EKG 12-Lead   Patient Instructions  Your physician recommends that you return for lab work in: 3 weeks (August 03, 2014).  Dr. Sallyanne Kuster recommends that you schedule a follow-up appointment in: 6 months.        Mikael Spray, MD  07/15/2014 6:50 PM    Sanda Klein, MD, [facility] [phone] office [phone] pager

## 2014-07-13 NOTE — Patient Instructions (Signed)
Your physician recommends that you return for lab work in: 3 weeks (August 03, 2014).  Dr. Sallyanne Kuster recommends that you schedule a follow-up appointment in: 6 months.

## 2014-07-15 ENCOUNTER — Encounter: Payer: Self-pay | Admitting: Cardiovascular Disease

## 2014-08-08 ENCOUNTER — Other Ambulatory Visit: Payer: Self-pay | Admitting: Cardiovascular Disease

## 2014-08-08 DIAGNOSIS — Z79899 Other long term (current) drug therapy: Secondary | ICD-10-CM | POA: Diagnosis not present

## 2014-08-08 LAB — BASIC METABOLIC PANEL
BUN/Creatinine Ratio: 7 — ABNORMAL LOW (ref 9–23)
BUN: 9 mg/dL (ref 6–24)
CHLORIDE: 104 mmol/L (ref 96–108)
CO2: 31 mmol/L — ABNORMAL HIGH (ref 18–29)
CREATININE: 1.36 mg/dL — AB (ref 0.57–1.00)
Calcium: 9.7 mg/dL (ref 8.7–10.2)
GFR calc Af Amer: 49 mL/min/{1.73_m2} — ABNORMAL LOW (ref >59)
GFR, EST NON AFRICAN AMERICAN: 43 mL/min/{1.73_m2} — AB (ref >59)
Glucose: 209 mg/dL — ABNORMAL HIGH (ref 65–99)
POTASSIUM: 3.8 mmol/L (ref 3.5–5.2)
Sodium: 141 mmol/L (ref 134–144)

## 2014-08-09 ENCOUNTER — Encounter: Payer: Self-pay | Admitting: Internal Medicine

## 2014-08-09 ENCOUNTER — Ambulatory Visit (INDEPENDENT_AMBULATORY_CARE_PROVIDER_SITE_OTHER): Payer: Medicare Other | Admitting: Internal Medicine

## 2014-08-09 VITALS — BP 124/82 | HR 76 | Temp 98.3°F | Resp 12 | Wt 235.0 lb

## 2014-08-09 DIAGNOSIS — I255 Ischemic cardiomyopathy: Secondary | ICD-10-CM

## 2014-08-09 DIAGNOSIS — E1122 Type 2 diabetes mellitus with diabetic chronic kidney disease: Secondary | ICD-10-CM

## 2014-08-09 DIAGNOSIS — N189 Chronic kidney disease, unspecified: Secondary | ICD-10-CM

## 2014-08-09 MED ORDER — INSULIN GLARGINE 300 UNIT/ML ~~LOC~~ SOPN: 50.0000 [IU] | PEN_INJECTOR | Freq: Every day | SUBCUTANEOUS | Status: DC

## 2014-08-09 NOTE — Patient Instructions (Addendum)
Please continue Toujeo 50 units daily. Please continue Novolog: 35 units before breakfast 25 units before lunch 40 units before dinner Continue sliding scale of NovoLog: - 150- 165: + 1 unit  - 166- 180: + 2 units  - 181- 195: + 3 units  - 196- 210: + 4 units  - 211- 225: + 5 units              - >225: + 6 units   If sugars before a meal are 80-150: take the whole amount of mealtime insulin with that meal, but not the Sliding scale  If sugars are 60-80, take only half of the mealtime insulin and no sliding scale.  If sugars <60, do not take any insulin with that meal.  Please come back in 3 months with your sugar log.  Please stop at the lab.

## 2014-08-09 NOTE — Progress Notes (Signed)
Patient ID: Brooke Stafford, female   DOB: May 06, 1955, 59 y.o.   MRN: [id number]  HPI: Brooke Stafford is a 59 y.o.-year-old female, returning for f/u for DM2, dx 2006, insulin-dependent since 2012, uncontrolled, with complications (CKD, iCMP, CAD - s/p CABG x 6, 2002, PN). Last visit 2 mo ago.  Last hemoglobin A1c was: Lab Results  Component Value Date   HGBA1C 9.1* 05/03/2014   HGBA1C 8.7* 01/04/2014   HGBA1C 10.5* 09/27/2013  She had a steroid inj for gout in 10/19/2013.  Pt is on a regimen of: She may forget to take the insulin. - Lantus 60 units qhs >> 35 units 2x a day >> 42 units 2x a day - bottle >> Toujeo 50 units daily - Novolog - pen: 35 units before breakfast 25 units before lunch 40 units before dinner  If sugars before a meal are 80-150: take the whole amount of mealtime insulin with that meal, but not the Sliding scale  If sugars are 60-80, take only half of the mealtime insulin and no sliding scale.  If sugars <60, do not take any insulin with that meal.  - sliding scale of NovoLog - added 11/2013: - 150- 165: + 1 unit  - 166- 180: + 2 units  - 181- 195: + 3 units  - 196- 210: + 4 units  - 211- 225: + 5 units              - >225: + 6 units  We stopped Glimepiride 4 mg in am.  She stopped Metformin (abd.pain, CKD).  We stopped Tradjenta 5 mg daily.  Pt checks her sugars 3 a day and they are better (eliminated cookies and other sweets, cut portions): - am: 190-200 in am; if does not take Lantus: 190-200 in am) >> 134-194 >> 150-228 >> 120-182 >> 87-155, 176 - 2h after b'fast: 240-250 >> 197 >> n/c - before lunch: 152-190, 201 x1 >> 118, 180-262 >> 78, 151-192, 200s >> 77-174 - 2h after lunch: 160-170 >> 270 x1 > n/c - before dinner: 140 >> n/c >> 152-198 >> 173, 194-288, 352 >> 130-190, some 200s >> 73-151, 185 - 2h after dinner: high 100s-215 >> 151, 159, 343 (cookies) >> n/c - bedtime: 176, 193, 223 (cookies) >> n/c No lows. Lowest sugar was 78x1 >> 52 x1,  before decreasing the dose; she has hypoglycemia awareness at 100.  Highest sugar was 200s >> 185.  Pt's meals are: - Breakfast: toast 2 slices - Lunch: sandwich, fruit - Dinner: meat, veggies, 1 starch - Snacks: 2: fruit or sandwich, diet Pepsi, 1/2 banana, PB crackers  - She has CKD, last BUN/creatinine:  Lab Results  Component Value Date   BUN 19 01/25/2014   CREATININE 1.38* 01/25/2014  Decreased from 1.8. Not on ACEI.  - last set of lipids: Lab Results  Component Value Date   CHOL 180 08/04/2013   HDL 35* 08/04/2013   LDLCALC 99 08/04/2013   TRIG 230* 08/04/2013   CHOLHDL 5.1 08/04/2013  On Zetia and Lipitor. Takes 1 ASA a day (81 mg) - last eye exam was in 08/2013. No DR reportedly. She has mild glaucoma.  - + numbness and tingling in her feet. She has PN >> takes gabapentin 400 mg 1-2x a day.   I reviewed pt's medications, allergies, PMH, social hx, family hx, and changes were documented in the history of present illness. Otherwise, unchanged from my initial visit note..   ROS: Constitutional: no weight gain,  no fatigue, + hot flushes, + poor sleep Eyes: no blurry vision, no xerophthalmia ENT: no sore throat, no nodules palpated in throat, no dysphagia/odynophagia, + hoarseness, + tinnitus, + hypoacusis Cardiovascular: no CP/SOB/no palpitations/leg swelling Respiratory: no cough/SOB/wheezing Gastrointestinal: no N/V/D/C, no heartburn Musculoskeletal: + muscle aches/no joint aches Skin: no rash, no itching Neurological: no tremors/numbness/tingling/dizziness, no HA  PE: BP 124/82 mmHg  Pulse 76  Temp(Src) 98.3 F (36.8 C) (Oral)  Resp 12  Wt 235 lb (106.595 kg)  SpO2 96%  LMP 02/28/2013 Body mass index is 33.23 kg/(m^2).  Wt Readings from Last 3 Encounters:  08/09/14 235 lb (106.595 kg)  07/13/14 236 lb 12.8 oz (107.412 kg)  06/09/14 242 lb (109.77 kg)   Constitutional: obese, in NAD Eyes: PERRLA, EOMI, no exophthalmos ENT: moist mucous membranes, no  thyromegaly, no cervical lymphadenopathy Cardiovascular: RRR, No MRG Respiratory: CTA B Gastrointestinal: abdomen soft, NT, ND, BS+ Musculoskeletal: no deformities, strength intact in all 4 Skin: moist, warm, no rashes, + hirsutism on chin Neurological: no tremor with outstretched hands, DTR normal in all 4  ASSESSMENT: 1. DM2, insulin-dependent, uncontrolled, with complications - CKD - Dr. Marval Regal - iCMP, CAD - s/p CABG x 6, 2002, s/p stent 2006, s/p stent 09/2013 - Dr. Sallyanne Kuster - PN  2. CP  PLAN:  1. Patient with long-standing, uncontrolled diabetes, on basal-bolus insulin regimen, with improvement in her sugars after stopping eating cookies and starting a reverse SSI. Patient Instructions  Please continue Toujeo 50 units daily. Please continue Novolog: 35 units before breakfast 25 units before lunch 40 units before dinner Continue sliding scale of NovoLog: - 150- 165: + 1 unit  - 166- 180: + 2 units  - 181- 195: + 3 units  - 196- 210: + 4 units  - 211- 225: + 5 units              - >225: + 6 units   If sugars before a meal are 80-150: take the whole amount of mealtime insulin with that meal, but not the Sliding scale  If sugars are 60-80, take only half of the mealtime insulin and no sliding scale.  If sugars <60, do not take any insulin with that meal.  Please come back in 3 months with your sugar log.  Please stop at the lab.  - continue checking sugars at different times of the day - check 3 times a day, rotating checks. I also advised her to start writing down her Novolog doses as she almost always ends up taking less >> we need that to be able to decrease her doses at next visit - up to date with yearly eye exams - will check HbA1c today - Return to clinic in 3 mo with sugar log   Office Visit on 08/09/2014  Component Date Value Ref Range Status  . Hgb A1c MFr Bld 08/09/2014 6.5  4.6 - 6.5 % Final   Glycemic Control Guidelines for People with Diabetes:Non  Diabetic:  <6%Goal of Therapy: <7%Additional Action Suggested:  >8%    Impressive improvement in HbA1c!

## 2014-08-10 LAB — HEMOGLOBIN A1C: Hgb A1c MFr Bld: 6.5 % (ref 4.6–6.5)

## 2014-08-11 ENCOUNTER — Ambulatory Visit: Payer: Medicare Other | Attending: Internal Medicine | Admitting: Internal Medicine

## 2014-08-11 ENCOUNTER — Encounter: Payer: Self-pay | Admitting: Internal Medicine

## 2014-08-11 VITALS — BP 166/100 | HR 81 | Temp 98.8°F | Ht 70.5 in | Wt 232.8 lb

## 2014-08-11 DIAGNOSIS — I255 Ischemic cardiomyopathy: Secondary | ICD-10-CM

## 2014-08-11 DIAGNOSIS — E1129 Type 2 diabetes mellitus with other diabetic kidney complication: Secondary | ICD-10-CM | POA: Diagnosis not present

## 2014-08-11 DIAGNOSIS — M1000 Idiopathic gout, unspecified site: Secondary | ICD-10-CM

## 2014-08-11 DIAGNOSIS — I10 Essential (primary) hypertension: Secondary | ICD-10-CM | POA: Diagnosis not present

## 2014-08-11 DIAGNOSIS — E1122 Type 2 diabetes mellitus with diabetic chronic kidney disease: Secondary | ICD-10-CM | POA: Diagnosis not present

## 2014-08-11 DIAGNOSIS — M109 Gout, unspecified: Secondary | ICD-10-CM | POA: Insufficient documentation

## 2014-08-11 DIAGNOSIS — N189 Chronic kidney disease, unspecified: Secondary | ICD-10-CM | POA: Diagnosis not present

## 2014-08-11 DIAGNOSIS — J302 Other seasonal allergic rhinitis: Secondary | ICD-10-CM | POA: Diagnosis not present

## 2014-08-11 LAB — CBC WITH DIFFERENTIAL/PLATELET
Basophils Absolute: 0 10*3/uL (ref 0.0–0.1)
Basophils Relative: 0 % (ref 0–1)
EOS ABS: 0.4 10*3/uL (ref 0.0–0.7)
EOS PCT: 6 % — AB (ref 0–5)
HCT: 39.5 % (ref 36.0–46.0)
Hemoglobin: 13.5 g/dL (ref 12.0–15.0)
LYMPHS ABS: 2.1 10*3/uL (ref 0.7–4.0)
Lymphocytes Relative: 30 % (ref 12–46)
MCH: 31.2 pg (ref 26.0–34.0)
MCHC: 34.2 g/dL (ref 30.0–36.0)
MCV: 91.2 fL (ref 78.0–100.0)
MONO ABS: 0.6 10*3/uL (ref 0.1–1.0)
MONOS PCT: 8 % (ref 3–12)
MPV: 10.6 fL (ref 8.6–12.4)
Neutro Abs: 3.9 10*3/uL (ref 1.7–7.7)
Neutrophils Relative %: 56 % (ref 43–77)
Platelets: 251 10*3/uL (ref 150–400)
RBC: 4.33 MIL/uL (ref 3.87–5.11)
RDW: 14.2 % (ref 11.5–15.5)
WBC: 6.9 10*3/uL (ref 4.0–10.5)

## 2014-08-11 LAB — GLUCOSE, POCT (MANUAL RESULT ENTRY): POC GLUCOSE: 178 mg/dL — AB (ref 70–99)

## 2014-08-11 MED ORDER — CLONIDINE HCL 0.1 MG PO TABS
0.2000 mg | ORAL_TABLET | Freq: Once | ORAL | Status: AC
  Administered 2014-08-11: 0.2 mg via ORAL

## 2014-08-11 MED ORDER — CLONIDINE HCL 0.2 MG PO TABS: 0.2000 mg | ORAL_TABLET | Freq: Two times a day (BID) | ORAL | Status: DC

## 2014-08-11 MED ORDER — ALBUTEROL SULFATE HFA 108 (90 BASE) MCG/ACT IN AERS: 2.0000 | INHALATION_SPRAY | Freq: Four times a day (QID) | RESPIRATORY_TRACT | Status: DC | PRN

## 2014-08-11 MED ORDER — ALLOPURINOL 300 MG PO TABS: 300.0000 mg | ORAL_TABLET | Freq: Every day | ORAL | Status: DC

## 2014-08-11 MED ORDER — FLUTICASONE PROPIONATE 50 MCG/ACT NA SUSP: 2.0000 | Freq: Every day | NASAL | Status: DC | PRN

## 2014-08-11 NOTE — Patient Instructions (Signed)
Diabetes and Exercise Exercising regularly is important. It is not just about losing weight. It has many health benefits, such as:  Improving your overall fitness, flexibility, and endurance.  Increasing your bone density.  Helping with weight control.  Decreasing your body fat.  Increasing your muscle strength.  Reducing stress and tension.  Improving your overall health. People with diabetes who exercise gain additional benefits because exercise:  Reduces appetite.  Improves the body's use of blood sugar (glucose).  Helps lower or control blood glucose.  Decreases blood pressure.  Helps control blood lipids (such as cholesterol and triglycerides).  Improves the body's use of the hormone insulin by:  Increasing the body's insulin sensitivity.  Reducing the body's insulin needs.  Decreases the risk for heart disease because exercising:  Lowers cholesterol and triglycerides levels.  Increases the levels of good cholesterol (such as high-density lipoproteins [HDL]) in the body.  Lowers blood glucose levels. YOUR ACTIVITY PLAN  Choose an activity that you enjoy and set realistic goals. Your health care provider or diabetes educator can help you make an activity plan that works for you. Exercise regularly as directed by your health care provider. This includes:  Performing resistance training twice a week such as push-ups, sit-ups, lifting weights, or using resistance bands.  Performing 150 minutes of cardio exercises each week such as walking, running, or playing sports.  Staying active and spending no more than 90 minutes at one time being inactive. Even short bursts of exercise are good for you. Three 10-minute sessions spread throughout the day are just as beneficial as a single 30-minute session. Some exercise ideas include:  Taking the dog for a walk.  Taking the stairs instead of the elevator.  Dancing to your favorite song.  Doing an exercise  video.  Doing your favorite exercise with a friend. RECOMMENDATIONS FOR EXERCISING WITH TYPE 1 OR TYPE 2 DIABETES   Check your blood glucose before exercising. If blood glucose levels are greater than 240 mg/dL, check for urine ketones. Do not exercise if ketones are present.  Avoid injecting insulin into areas of the body that are going to be exercised. For example, avoid injecting insulin into:  The arms when playing tennis.  The legs when jogging.  Keep a record of:  Food intake before and after you exercise.  Expected peak times of insulin action.  Blood glucose levels before and after you exercise.  The type and amount of exercise you have done.  Review your records with your health care provider. Your health care provider will help you to develop guidelines for adjusting food intake and insulin amounts before and after exercising.  If you take insulin or oral hypoglycemic agents, watch for signs and symptoms of hypoglycemia. They include:  Dizziness.  Shaking.  Sweating.  Chills.  Confusion.  Drink plenty of water while you exercise to prevent dehydration or heat stroke. Body water is lost during exercise and must be replaced.  Talk to your health care provider before starting an exercise program to make sure it is safe for you. Remember, almost any type of activity is better than none. Document Released: 06/22/2003 Document Revised: 08/16/2013 Document Reviewed: 09/08/2012 [employer] Patient Information 2015 [employer]. This information is not intended to replace advice given to you by your health care provider. Make sure you discuss any questions you have with your health care provider. Basic Carbohydrate Counting for Diabetes Mellitus Carbohydrate counting is a method for keeping track of the amount of carbohydrates you eat.   Eating carbohydrates naturally increases the level of sugar (glucose) in your blood, so it is important for you to know the amount that is  okay for you to have in every meal. Carbohydrate counting helps keep the level of glucose in your blood within normal limits. The amount of carbohydrates allowed is different for every person. A dietitian can help you calculate the amount that is right for you. Once you know the amount of carbohydrates you can have, you can count the carbohydrates in the foods you want to eat. Carbohydrates are found in the following foods:  Grains, such as breads and cereals.  Dried beans and soy products.  Starchy vegetables, such as potatoes, peas, and corn.  Fruit and fruit juices.  Milk and yogurt.  Sweets and snack foods, such as cake, cookies, candy, chips, soft drinks, and fruit drinks. CARBOHYDRATE COUNTING There are two ways to count the carbohydrates in your food. You can use either of the methods or a combination of both. Reading the "Nutrition Facts" on Packaged Food The "Nutrition Facts" is an area that is included on the labels of almost all packaged food and beverages in the [place]. It includes the serving size of that food or beverage and information about the nutrients in each serving of the food, including the grams (g) of carbohydrate per serving.  Decide the number of servings of this food or beverage that you will be able to eat or drink. Multiply that number of servings by the number of grams of carbohydrate that is listed on the label for that serving. The total will be the amount of carbohydrates you will be having when you eat or drink this food or beverage. Learning Standard Serving Sizes of Food When you eat food that is not packaged or does not include "Nutrition Facts" on the label, you need to measure the servings in order to count the amount of carbohydrates.A serving of most carbohydrate-rich foods contains about 15 g of carbohydrates. The following list includes serving sizes of carbohydrate-rich foods that provide 15 g ofcarbohydrate per serving:   1 slice of bread  (1 oz) or 1 six-inch tortilla.    of a hamburger bun or English muffin.  4-6 crackers.   cup unsweetened dry cereal.    cup hot cereal.   cup rice or pasta.    cup mashed potatoes or  of a large baked potato.  1 cup fresh fruit or one small piece of fruit.    cup canned or frozen fruit or fruit juice.  1 cup milk.   cup plain fat-free yogurt or yogurt sweetened with artificial sweeteners.   cup cooked dried beans or starchy vegetable, such as peas, corn, or potatoes.  Decide the number of standard-size servings that you will eat. Multiply that number of servings by 15 (the grams of carbohydrates in that serving). For example, if you eat 2 cups of strawberries, you will have eaten 2 servings and 30 g of carbohydrates (2 servings x 15 g = 30 g). For foods such as soups and casseroles, in which more than one food is mixed in, you will need to count the carbohydrates in each food that is included. EXAMPLE OF CARBOHYDRATE COUNTING Sample Dinner  3 oz chicken breast.   cup of brown rice.   cup of corn.  1 cup milk.   1 cup strawberries with sugar-free whipped topping.  Carbohydrate Calculation Step 1: Identify the foods that contain carbohydrates:   Rice.   Corn.     Milk.   Strawberries. Step 2:Calculate the number of servings eaten of each:   2 servings of rice.   1 serving of corn.   1 serving of milk.   1 serving of strawberries. Step 3: Multiply each of those number of servings by 15 g:   2 servings of rice x 15 g = 30 g.   1 serving of corn x 15 g = 15 g.   1 serving of milk x 15 g = 15 g.   1 serving of strawberries x 15 g = 15 g. Step 4: Add together all of the amounts to find the total grams of carbohydrates eaten: 30 g + 15 g + 15 g + 15 g = 75 g. Document Released: 04/01/2005 Document Revised: 08/16/2013 Document Reviewed: 02/26/2013 [facility] Patient Information 2015 [place], [place]. This information is not intended to  replace advice given to you by your health care provider. Make sure you discuss any questions you have with your health care provider. DASH Eating Plan DASH stands for "Dietary Approaches to Stop Hypertension." The DASH eating plan is a healthy eating plan that has been shown to reduce high blood pressure (hypertension). Additional health benefits may include reducing the risk of type 2 diabetes mellitus, heart disease, and stroke. The DASH eating plan may also help with weight loss. WHAT DO I NEED TO KNOW ABOUT THE DASH EATING PLAN? For the DASH eating plan, you will follow these general guidelines:  Choose foods with a percent daily value for sodium of less than 5% (as listed on the food label).  Use salt-free seasonings or herbs instead of table salt or sea salt.  Check with your health care provider or pharmacist before using salt substitutes.  Eat lower-sodium products, often labeled as "lower sodium" or "no salt added."  Eat fresh foods.  Eat more vegetables, fruits, and low-fat dairy products.  Choose whole grains. Look for the word "whole" as the first word in the ingredient list.  Choose fish and skinless chicken or [place] more often than red meat. Limit fish, poultry, and meat to 6 oz (170 g) each day.  Limit sweets, desserts, sugars, and sugary drinks.  Choose heart-healthy fats.  Limit cheese to 1 oz (28 g) per day.  Eat more home-cooked food and less restaurant, buffet, and fast food.  Limit fried foods.  Cook foods using methods other than frying.  Limit canned vegetables. If you do use them, rinse them well to decrease the sodium.  When eating at a restaurant, ask that your food be prepared with less salt, or no salt if possible. WHAT FOODS CAN I EAT? Seek help from a dietitian for individual calorie needs. Grains Whole grain or whole wheat bread. Brown rice. Whole grain or whole wheat pasta. Quinoa, bulgur, and whole grain cereals. Low-sodium cereals. Corn or  whole wheat flour tortillas. Whole grain cornbread. Whole grain crackers. Low-sodium crackers. Vegetables Fresh or frozen vegetables (raw, steamed, roasted, or grilled). Low-sodium or reduced-sodium tomato and vegetable juices. Low-sodium or reduced-sodium tomato sauce and paste. Low-sodium or reduced-sodium canned vegetables.  Fruits All fresh, canned (in natural juice), or frozen fruits. Meat and Other Protein Products Ground beef (85% or leaner), grass-fed beef, or beef trimmed of fat. Skinless chicken or [place]. Ground chicken or [place]. Pork trimmed of fat. All fish and seafood. Eggs. Dried beans, peas, or lentils. Unsalted nuts and seeds. Unsalted canned beans. Dairy Low-fat dairy products, such as skim or 1% milk, 2% or reduced-fat cheeses, low-fat  ricotta or cottage cheese, or plain low-fat yogurt. Low-sodium or reduced-sodium cheeses. Fats and Oils Tub margarines without trans fats. Light or reduced-fat mayonnaise and salad dressings (reduced sodium). Avocado. Safflower, olive, or canola oils. Natural peanut or almond butter. Other Unsalted popcorn and pretzels. The items listed above may not be a complete list of recommended foods or beverages. Contact your dietitian for more options. WHAT FOODS ARE NOT RECOMMENDED? Grains White bread. White pasta. White rice. Refined cornbread. Bagels and croissants. Crackers that contain trans fat. Vegetables Creamed or fried vegetables. Vegetables in a cheese sauce. Regular canned vegetables. Regular canned tomato sauce and paste. Regular tomato and vegetable juices. Fruits Dried fruits. Canned fruit in light or heavy syrup. Fruit juice. Meat and Other Protein Products Fatty cuts of meat. Ribs, chicken wings, bacon, sausage, bologna, salami, chitterlings, fatback, hot dogs, bratwurst, and packaged luncheon meats. Salted nuts and seeds. Canned beans with salt. Dairy Whole or 2% milk, cream, half-and-half, and cream cheese. Whole-fat or sweetened  yogurt. Full-fat cheeses or blue cheese. Nondairy creamers and whipped toppings. Processed cheese, cheese spreads, or cheese curds. Condiments Onion and garlic salt, seasoned salt, table salt, and sea salt. Canned and packaged gravies. Worcestershire sauce. Tartar sauce. Barbecue sauce. Teriyaki sauce. Soy sauce, including reduced sodium. Steak sauce. Fish sauce. Oyster sauce. Cocktail sauce. Horseradish. Ketchup and mustard. Meat flavorings and tenderizers. Bouillon cubes. Hot sauce. Tabasco sauce. Marinades. Taco seasonings. Relishes. Fats and Oils Butter, stick margarine, lard, shortening, ghee, and bacon fat. Coconut, palm kernel, or palm oils. Regular salad dressings. Other Pickles and olives. Salted popcorn and pretzels. The items listed above may not be a complete list of foods and beverages to avoid. Contact your dietitian for more information. WHERE CAN I FIND MORE INFORMATION? National Heart, Lung, and Blood Institute: travelstabloid.com Document Released: 03/21/2011 Document Revised: 08/16/2013 Document Reviewed: 02/03/2013 [facility] Patient Information 2015 [place], [place]. This information is not intended to replace advice given to you by your health care provider. Make sure you discuss any questions you have with your health care provider. Hypertension Hypertension, commonly called high blood pressure, is when the force of blood pumping through your arteries is too strong. Your arteries are the blood vessels that carry blood from your heart throughout your body. A blood pressure reading consists of a higher number over a lower number, such as 110/72. The higher number (systolic) is the pressure inside your arteries when your heart pumps. The lower number (diastolic) is the pressure inside your arteries when your heart relaxes. Ideally you want your blood pressure below 120/80. Hypertension forces your heart to work harder to pump blood. Your arteries may  become narrow or stiff. Having hypertension puts you at risk for heart disease, stroke, and other problems.  RISK FACTORS Some risk factors for high blood pressure are controllable. Others are not.  Risk factors you cannot control include:   Race. You may be at higher risk if you are African American.  Age. Risk increases with age.  Gender. Men are at higher risk than women before age 23 years. After age 71, women are at higher risk than men. Risk factors you can control include:  Not getting enough exercise or physical activity.  Being overweight.  Getting too much fat, sugar, calories, or salt in your diet.  Drinking too much alcohol. SIGNS AND SYMPTOMS Hypertension does not usually cause signs or symptoms. Extremely high blood pressure (hypertensive crisis) may cause headache, anxiety, shortness of breath, and nosebleed. DIAGNOSIS  To check if  you have hypertension, your health care provider will measure your blood pressure while you are seated, with your arm held at the level of your heart. It should be measured at least twice using the same arm. Certain conditions can cause a difference in blood pressure between your right and left arms. A blood pressure reading that is higher than normal on one occasion does not mean that you need treatment. If one blood pressure reading is high, ask your health care provider about having it checked again. TREATMENT  Treating high blood pressure includes making lifestyle changes and possibly taking medicine. Living a healthy lifestyle can help lower high blood pressure. You may need to change some of your habits. Lifestyle changes may include:  Following the DASH diet. This diet is high in fruits, vegetables, and whole grains. It is low in salt, red meat, and added sugars.  Getting at least 2 hours of brisk physical activity every week.  Losing weight if necessary.  Not smoking.  Limiting alcoholic beverages.  Learning ways to reduce  stress. If lifestyle changes are not enough to get your blood pressure under control, your health care provider may prescribe medicine. You may need to take more than one. Work closely with your health care provider to understand the risks and benefits. HOME CARE INSTRUCTIONS  Have your blood pressure rechecked as directed by your health care provider.   Take medicines only as directed by your health care provider. Follow the directions carefully. Blood pressure medicines must be taken as prescribed. The medicine does not work as well when you skip doses. Skipping doses also puts you at risk for problems.   Do not smoke.   Monitor your blood pressure at home as directed by your health care provider. SEEK MEDICAL CARE IF:   You think you are having a reaction to medicines taken.  You have recurrent headaches or feel dizzy.  You have swelling in your ankles.  You have trouble with your vision. SEEK IMMEDIATE MEDICAL CARE IF:  You develop a severe headache or confusion.  You have unusual weakness, numbness, or feel faint.  You have severe chest or abdominal pain.  You vomit repeatedly.  You have trouble breathing. MAKE SURE YOU:   Understand these instructions.  Will watch your condition.  Will get help right away if you are not doing well or get worse. Document Released: 04/01/2005 Document Revised: 08/16/2013 Document Reviewed: 01/22/2013 [facility] Patient Information 2015 [place], [place]. This information is not intended to replace advice given to you by your health care provider. Make sure you discuss any questions you have with your health care provider.

## 2014-08-11 NOTE — Progress Notes (Signed)
Patient ID: Brooke Stafford, female   DOB: July 01, 1955, 59 y.o.   MRN: [id number]   Brooke Stafford, is a 59 y.o. female  [id number]  [id number]  DOB - 07-20-1955  Chief Complaint  Patient presents with  . Diabetes  . Hypertension        Subjective:   Brooke Stafford is a 59 y.o. female here today for a follow up visit. She has a past medical history significant for severe type 2 diabetes mellitus requiring insulin therapy, systemic hypertension, hyperlipidemia, obesity, PVD S/P LSFA PTA initally in 2012 and again in 2014, coronary artery disease, S/P CABG x 6 in 2002. In July 2011 when she presented with sepsis and medication non compliance and had a NSTEMI from demand ischemia: Cath showed a patent LIMA to LAD, patent SVG to diagonal, and patent sequential SVG to branches of the right coronary artery, chronic occlusion of SVG-ramus intermedius. She also has a mild ischemic cardiomyopathy with a left ventricular ejection fraction of 45% to 50% by echo 5/11. In May 2015 both echo and nuclear stress testing showed normal LV EF around 60%. June 2015 presented with crescendo angina and received a stent to the SVG to diagonal artery (3.5 x 23 mm Xience [place]). No change in other coronary/graft anatomy, persistent stenosis in the ramus intermedius artery left for medical therapy.  Patient follows up regularly with endocrinologists for diabetes management, cardiologist for extensive cardiac history and she was recently seen by podiatrist. She is here today for routine follow-up and blood pressure check. She claims she is compliant with her medications, she reports no significant side effects. She denies any chest pain, no nausea or vomiting, no diarrhea and no abdominal distention. She denies shortness of breath. She is requesting refill of her medications for seasonal allergies and allopurinol for her gout.  Problem  Type 2 Diabetes Mellitus With Chronic Kidney Disease  Seasonal Allergies    Gout of Big Toe    ALLERGIES: Allergies  Allergen Reactions  . Hydralazine Shortness Of Breath  . Penicillins Cross Reactors Hives    And high fever  . Adhesive [Tape] Rash    bruising    PAST MEDICAL HISTORY: Past Medical History  Diagnosis Date  . Asthma   . Anxiety   . GERD (gastroesophageal reflux disease)   . Coronary artery disease 2002    CABG x 6. Cath 5/11- med Rx  . Hypertension   . Peripheral vascular disease 12/12    LSFA PTA  . Anemia   . Hyperlipidemia   . Chronic renal insufficiency, stage II (mild)     followed  by [facility]  . CAD (coronary artery disease) 2002; 2015    CABG x 6 2002, cath 2011- med Rx stent DES VG-Diag  . Hypothyroid     treated  . Obesity (BMI 35.0-39.9 without comorbidity)   . CHF (congestive heart failure)     "in 2002" (11/26/2012)  . Myocardial infarction 2000; 2002; 2011?  [name] Chronic bronchitis     "q year; in the winter" (08/04/2013)  . Type II diabetes mellitus   . Depression   . Pneumonia     "3 times I think" (08/04/2013)  . History of blood transfusion 2002    "when I had OHS"  . Migraines     "couple times/year" (08/04/2013)  . Headache     "~ q week" (08/04/2013)  . Arthritis     "stiff fingers and knees" (08/04/2013)  . Gout     "  right big toe"  . Anginal pain   . CAD (coronary artery disease) of artery bypass graft; DES to VG-Diag 09/28/13 11/09/2013    MEDICATIONS AT HOME: Prior to Admission medications   Medication Sig Start Date End Date Taking? Authorizing Provider  acetaminophen-codeine (TYLENOL #3) 300-30 MG per tablet Take 1 tablet by mouth every 4 (four) hours as needed. 05/16/14  Yes Tresa Garter, MD  albuterol (PROVENTIL HFA;VENTOLIN HFA) 108 (90 BASE) MCG/ACT inhaler Inhale 2 puffs into the lungs every 6 (six) hours as needed for wheezing or shortness of breath. 08/11/14  Yes Tresa Garter, MD  allopurinol (ZYLOPRIM) 300 MG tablet Take 1 tablet (300 mg total) by mouth daily. 08/11/14  Yes  Tresa Garter, MD  amLODipine-benazepril (LOTREL) 5-10 MG per capsule Take 1 capsule by mouth daily.   Yes Historical Provider, MD  aspirin 81 MG chewable tablet Chew 1 tablet (81 mg total) by mouth daily. 09/29/13  Yes Brittainy Erie Noe, PA-C  atorvastatin (LIPITOR) 40 MG tablet Take 1 tablet (40 mg total) by mouth daily. 06/23/14  Yes Tresa Garter, MD  cloNIDine (CATAPRES) 0.2 MG tablet Take 1 tablet (0.2 mg total) by mouth 2 (two) times daily. 08/11/14  Yes Tresa Garter, MD  clopidogrel (PLAVIX) 75 MG tablet Take 1 tablet (75 mg total) by mouth daily. 03/16/14  Yes Tresa Garter, MD  cyclobenzaprine (FLEXERIL) 10 MG tablet Take 1 tablet (10 mg total) by mouth 3 (three) times daily as needed for muscle spasms. 01/27/14  Yes Tresa Garter, MD  ezetimibe (ZETIA) 10 MG tablet Take 1 tablet (10 mg total) by mouth daily. 08/30/13  Yes Brittainy Erie Noe, PA-C  fluticasone (FLONASE) 50 MCG/ACT nasal spray Place 2 sprays into both nostrils daily as needed for allergies or rhinitis. 08/11/14  Yes Tresa Garter, MD  gabapentin (NEURONTIN) 400 MG capsule Take 1 capsule (400 mg total) by mouth 3 (three) times daily. 01/27/14  Yes Tresa Garter, MD  hydrocortisone cream 0.5 % Apply 1 application topically 2 (two) times daily. 01/27/14  Yes Emina Ribaudo Essie Christine, MD  insulin aspart (NOVOLOG FLEXPEN) 100 UNIT/ML FlexPen Inject 25-45 Units into the skin 3 (three) times daily with meals. Inject 35 units subq at breakfast, inject 25 units subq at lunch and inject 40 units subq at supper. 05/30/14  Yes Philemon Kingdom, MD  Insulin Glargine (TOUJEO SOLOSTAR) 300 UNIT/ML SOPN Inject 50 Units into the skin daily. 08/09/14  Yes Philemon Kingdom, MD  Insulin Pen Needle 32G X 4 MM MISC Use to inject insulin daily as instructed. 12/07/13  Yes Philemon Kingdom, MD  Insulin Syringes, Disposable, (B-D INSULIN SYRINGE 1CC) U-100 1 ML MISC Use to inject insulin 4 times daily as instructed.  02/22/14  Yes Philemon Kingdom, MD  isosorbide mononitrate (IMDUR) 60 MG 24 hr tablet Take 1.5 tablets (90 mg total) by mouth daily. 01/25/14  Yes Brett Canales, PA-C  levothyroxine (SYNTHROID, LEVOTHROID) 25 MCG tablet take 1 tablet by mouth daily BEFORE BREAKFAST 07/01/14  Yes Tresa Garter, MD  loratadine (CLARITIN) 10 MG tablet Take 1 tablet (10 mg total) by mouth daily. 09/21/13  Yes Lorayne Marek, MD  Menthol-Camphor (TIGER BALM ARTHRITIS RUB) 11-11 % CREA Apply 1 application topically 2 (two) times daily.   Yes Historical Provider, MD  metoprolol succinate (TOPROL-XL) 50 MG 24 hr tablet Take 3 tablets (150 mg total) by mouth daily. Take with or immediately following a meal. 01/25/14  Yes Brett Canales,  PA-C  nitroGLYCERIN (NITROSTAT) 0.4 MG SL tablet Place 1 tablet (0.4 mg total) under the tongue every 5 (five) minutes as needed. For chest 08/30/13  Yes Brittainy Erie Noe, PA-C  polyethylene glycol (MIRALAX / GLYCOLAX) packet Take 17 g by mouth daily as needed for moderate constipation.   Yes Historical Provider, MD  traZODone (DESYREL) 50 MG tablet Take 0.5 tablets (25 mg total) by mouth at bedtime. 05/16/14  Yes Tresa Garter, MD     Objective:   Filed Vitals:   08/11/14 1200 08/11/14 1207  BP: 182/103 166/100  Pulse: 81   Temp: 98.8 F (37.1 C)   TempSrc: Oral   Height: 5' 10.5" (1.791 m)   Weight: 232 lb 12.8 oz (105.597 kg)   SpO2: 97%     Exam General appearance : Awake, alert, not in any distress. Speech Clear. Not toxic looking HEENT: Atraumatic and Normocephalic, pupils equally reactive to light and accomodation Neck: supple, no JVD. No cervical lymphadenopathy.  Chest:Good air entry bilaterally, no added sounds  CVS: S1 S2 regular, no murmurs.  Abdomen: Bowel sounds present, Non tender and not distended with no gaurding, rigidity or rebound. Extremities: B/L Lower Ext shows no edema, both legs are warm to touch Neurology: Awake alert, and oriented X 3, CN  II-XII intact, Non focal Skin:No Rash  Data Review Lab Results  Component Value Date   HGBA1C 6.5 08/09/2014   HGBA1C 9.1* 05/03/2014   HGBA1C 8.7* 01/04/2014     Assessment & Plan   1. Type 2 diabetes mellitus with chronic kidney disease Last hemoglobin A1c 6.5%, down from 9.1%. - Glucose (CBG) - CBC with Differential/Platelet - Microalbumin, urine - Microalbumin/Creatinine Ratio, Urine  Aim for 30 minutes of exercise most days. Rethink what you drink. Water is great! Aim for 2-3 Carb Choices per meal (30-45 grams) +/- 1 either way  Aim for 0-15 Carbs per snack if hungry  Include protein in moderation with your meals and snacks  Consider reading food labels for Total Carbohydrate and Fat Grams of foods  Consider checking BG at alternate times per day  Continue taking medication as directed Be mindful about how much sugar you are adding to beverages and other foods. Fruit Punch - find one with no sugar  Measure and decrease portions of carbohydrate foods  Make your plate and don't go back for seconds   2. Essential hypertension: Uncontrolled Blood pressure came down to 160/100 mmHg at the time of discharge from the clinic.  Add - cloNIDine (CATAPRES) 0.2 MG tablet; Take 1 tablet (0.2 mg total) by mouth 2 (two) times daily.  Dispense: 180 tablet; Refill: 3  We have discussed target BP range and blood pressure goal. I have advised patient to check BP regularly and to call us back or report to clinic if the numbers are consistently higher than 140/90. We discussed the importance of compliance with medical therapy and DASH diet recommended, consequences of uncontrolled hypertension discussed.  - continue other BP medications  3. Seasonal allergies  - fluticasone (FLONASE) 50 MCG/ACT nasal spray; Place 2 sprays into both nostrils daily as needed for allergies or rhinitis.  Dispense: 16 g; Refill: 3 - albuterol (PROVENTIL HFA;VENTOLIN HFA) 108 (90 BASE) MCG/ACT inhaler;  Inhale 2 puffs into the lungs every 6 (six) hours as needed for wheezing or shortness of breath.  Dispense: 3 Inhaler; Refill: 3  4. Gout of big toe  - allopurinol (ZYLOPRIM) 300 MG tablet; Take 1 tablet (300 mg total) by  mouth daily.  Dispense: 90 tablet; Refill: 3  Patient have been counseled extensively about nutrition and exercise Return in about 3 months (around 11/10/2014) for Hemoglobin A1C and Follow up, DM, Follow up HTN.  The patient was given clear instructions to go to ER or return to medical center if symptoms don't improve, worsen or new problems develop. The patient verbalized understanding. The patient was told to call to get lab results if they haven't heard anything in the next week.   This note has been created with Surveyor, quantity. Any transcriptional errors are unintentional.    Angelica Chessman, MD, [place], [place], [place], [place] and [place], [place]   08/11/2014, 12:44 PM

## 2014-08-14 ENCOUNTER — Other Ambulatory Visit: Payer: Self-pay | Admitting: Cardiology

## 2014-08-18 ENCOUNTER — Telehealth: Payer: Self-pay

## 2014-08-18 NOTE — Telephone Encounter (Signed)
Patient returned phone call and is aware of her lab results 

## 2014-08-18 NOTE — Telephone Encounter (Signed)
-----   Message from Tresa Garter, MD sent at 08/17/2014  6:41 PM EDT ----- Please inform patient that her complete blood count is normal

## 2014-08-18 NOTE — Telephone Encounter (Signed)
Pt returning call, please f/u with pt.  °

## 2014-08-18 NOTE — Telephone Encounter (Signed)
Patient not available Left message on voice mail to return our call 

## 2014-08-19 ENCOUNTER — Telehealth: Payer: Self-pay

## 2014-08-19 NOTE — Telephone Encounter (Signed)
Returned patient phone call Patient not available Left message on voice mail to return our call 

## 2014-08-22 ENCOUNTER — Other Ambulatory Visit: Payer: Self-pay | Admitting: Cardiology

## 2014-08-23 ENCOUNTER — Other Ambulatory Visit: Payer: Self-pay | Admitting: Internal Medicine

## 2014-08-30 ENCOUNTER — Other Ambulatory Visit: Payer: Self-pay | Admitting: Internal Medicine

## 2014-09-04 ENCOUNTER — Other Ambulatory Visit: Payer: Self-pay | Admitting: Cardiology

## 2014-09-04 ENCOUNTER — Other Ambulatory Visit: Payer: Self-pay | Admitting: Internal Medicine

## 2014-09-05 NOTE — Telephone Encounter (Signed)
Rx has been sent to the pharmacy electronically. ° °

## 2014-09-13 ENCOUNTER — Other Ambulatory Visit: Payer: Self-pay | Admitting: *Deleted

## 2014-09-13 MED ORDER — INSULIN ASPART 100 UNIT/ML FLEXPEN
25.0000 [IU] | PEN_INJECTOR | Freq: Three times a day (TID) | SUBCUTANEOUS | Status: DC
Start: 2014-09-13 — End: 2014-12-15

## 2014-09-17 ENCOUNTER — Other Ambulatory Visit: Payer: Self-pay | Admitting: Cardiology

## 2014-09-19 NOTE — Telephone Encounter (Signed)
Rx(s) sent to pharmacy electronically.  

## 2014-09-23 DIAGNOSIS — H04123 Dry eye syndrome of bilateral lacrimal glands: Secondary | ICD-10-CM | POA: Diagnosis not present

## 2014-09-23 DIAGNOSIS — H40053 Ocular hypertension, bilateral: Secondary | ICD-10-CM | POA: Diagnosis not present

## 2014-09-23 DIAGNOSIS — H11153 Pinguecula, bilateral: Secondary | ICD-10-CM | POA: Diagnosis not present

## 2014-09-23 DIAGNOSIS — H25013 Cortical age-related cataract, bilateral: Secondary | ICD-10-CM | POA: Diagnosis not present

## 2014-09-23 DIAGNOSIS — H40013 Open angle with borderline findings, low risk, bilateral: Secondary | ICD-10-CM | POA: Diagnosis not present

## 2014-09-23 DIAGNOSIS — H5213 Myopia, bilateral: Secondary | ICD-10-CM | POA: Diagnosis not present

## 2014-09-23 DIAGNOSIS — E13319 Other specified diabetes mellitus with unspecified diabetic retinopathy without macular edema: Secondary | ICD-10-CM | POA: Diagnosis not present

## 2014-09-23 DIAGNOSIS — H18413 Arcus senilis, bilateral: Secondary | ICD-10-CM | POA: Diagnosis not present

## 2014-09-23 DIAGNOSIS — H25043 Posterior subcapsular polar age-related cataract, bilateral: Secondary | ICD-10-CM | POA: Diagnosis not present

## 2014-09-23 DIAGNOSIS — H2513 Age-related nuclear cataract, bilateral: Secondary | ICD-10-CM | POA: Diagnosis not present

## 2014-09-23 DIAGNOSIS — E13331 Other specified diabetes mellitus with moderate nonproliferative diabetic retinopathy with macular edema: Secondary | ICD-10-CM | POA: Diagnosis not present

## 2014-09-23 LAB — HM DIABETES EYE EXAM

## 2014-10-01 LAB — HM DIABETES EYE EXAM

## 2014-10-04 DIAGNOSIS — M5416 Radiculopathy, lumbar region: Secondary | ICD-10-CM | POA: Diagnosis not present

## 2014-10-04 DIAGNOSIS — M25471 Effusion, right ankle: Secondary | ICD-10-CM | POA: Diagnosis not present

## 2014-10-04 DIAGNOSIS — M545 Low back pain: Secondary | ICD-10-CM | POA: Diagnosis not present

## 2014-10-04 DIAGNOSIS — M25441 Effusion, right hand: Secondary | ICD-10-CM | POA: Diagnosis not present

## 2014-10-05 ENCOUNTER — Encounter: Payer: Self-pay | Admitting: Cardiovascular Disease

## 2014-10-10 ENCOUNTER — Encounter: Payer: Self-pay | Admitting: *Deleted

## 2014-10-12 ENCOUNTER — Telehealth: Payer: Self-pay | Admitting: Internal Medicine

## 2014-10-12 ENCOUNTER — Other Ambulatory Visit: Payer: Self-pay | Admitting: Internal Medicine

## 2014-10-12 ENCOUNTER — Other Ambulatory Visit: Payer: Self-pay | Admitting: *Deleted

## 2014-10-12 DIAGNOSIS — E1140 Type 2 diabetes mellitus with diabetic neuropathy, unspecified: Secondary | ICD-10-CM

## 2014-10-12 MED ORDER — INSULIN GLARGINE 300 UNIT/ML ~~LOC~~ SOPN: 50.0000 [IU] | PEN_INJECTOR | Freq: Every day | SUBCUTANEOUS | Status: DC

## 2014-10-12 MED ORDER — ACETAMINOPHEN-CODEINE #3 300-30 MG PO TABS: 1.0000 | ORAL_TABLET | ORAL | Status: DC | PRN

## 2014-10-12 NOTE — Telephone Encounter (Signed)
Patient had sent a message thru my chart requesting a refill on tylenol #3 Prescription printed and is at the front desk

## 2014-10-12 NOTE — Telephone Encounter (Signed)
Patient was called to inform her that her script for Tylenol #3 is ready to be picked up at front;

## 2014-10-25 DIAGNOSIS — H2511 Age-related nuclear cataract, right eye: Secondary | ICD-10-CM | POA: Diagnosis not present

## 2014-10-25 DIAGNOSIS — H02839 Dermatochalasis of unspecified eye, unspecified eyelid: Secondary | ICD-10-CM | POA: Diagnosis not present

## 2014-10-25 DIAGNOSIS — E11321 Type 2 diabetes mellitus with mild nonproliferative diabetic retinopathy with macular edema: Secondary | ICD-10-CM | POA: Diagnosis not present

## 2014-10-25 DIAGNOSIS — H18411 Arcus senilis, right eye: Secondary | ICD-10-CM | POA: Diagnosis not present

## 2014-10-25 DIAGNOSIS — H2512 Age-related nuclear cataract, left eye: Secondary | ICD-10-CM | POA: Diagnosis not present

## 2014-11-07 ENCOUNTER — Other Ambulatory Visit: Payer: Self-pay

## 2014-11-07 MED ORDER — LEVOTHYROXINE SODIUM 25 MCG PO TABS: ORAL_TABLET | ORAL | Status: DC

## 2014-11-08 ENCOUNTER — Ambulatory Visit: Payer: Medicare Other | Admitting: Internal Medicine

## 2014-11-08 DIAGNOSIS — M545 Low back pain: Secondary | ICD-10-CM | POA: Diagnosis not present

## 2014-11-08 DIAGNOSIS — M79641 Pain in right hand: Secondary | ICD-10-CM | POA: Diagnosis not present

## 2014-11-08 DIAGNOSIS — M5416 Radiculopathy, lumbar region: Secondary | ICD-10-CM | POA: Diagnosis not present

## 2014-11-08 DIAGNOSIS — M109 Gout, unspecified: Secondary | ICD-10-CM | POA: Diagnosis not present

## 2014-11-10 ENCOUNTER — Encounter: Payer: Self-pay | Admitting: Internal Medicine

## 2014-11-10 ENCOUNTER — Ambulatory Visit: Payer: Medicare Other | Attending: Internal Medicine | Admitting: Internal Medicine

## 2014-11-10 VITALS — BP 146/70 | HR 63 | Temp 98.3°F | Resp 18 | Ht 70.5 in | Wt 238.8 lb

## 2014-11-10 DIAGNOSIS — Z79899 Other long term (current) drug therapy: Secondary | ICD-10-CM | POA: Diagnosis not present

## 2014-11-10 DIAGNOSIS — R600 Localized edema: Secondary | ICD-10-CM

## 2014-11-10 DIAGNOSIS — K219 Gastro-esophageal reflux disease without esophagitis: Secondary | ICD-10-CM | POA: Diagnosis not present

## 2014-11-10 DIAGNOSIS — I255 Ischemic cardiomyopathy: Secondary | ICD-10-CM | POA: Diagnosis not present

## 2014-11-10 DIAGNOSIS — I129 Hypertensive chronic kidney disease with stage 1 through stage 4 chronic kidney disease, or unspecified chronic kidney disease: Secondary | ICD-10-CM | POA: Insufficient documentation

## 2014-11-10 DIAGNOSIS — E1122 Type 2 diabetes mellitus with diabetic chronic kidney disease: Secondary | ICD-10-CM | POA: Diagnosis not present

## 2014-11-10 DIAGNOSIS — F419 Anxiety disorder, unspecified: Secondary | ICD-10-CM | POA: Diagnosis not present

## 2014-11-10 DIAGNOSIS — Z7902 Long term (current) use of antithrombotics/antiplatelets: Secondary | ICD-10-CM | POA: Diagnosis not present

## 2014-11-10 DIAGNOSIS — E785 Hyperlipidemia, unspecified: Secondary | ICD-10-CM | POA: Diagnosis not present

## 2014-11-10 DIAGNOSIS — I25810 Atherosclerosis of coronary artery bypass graft(s) without angina pectoris: Secondary | ICD-10-CM | POA: Insufficient documentation

## 2014-11-10 DIAGNOSIS — N182 Chronic kidney disease, stage 2 (mild): Secondary | ICD-10-CM | POA: Insufficient documentation

## 2014-11-10 DIAGNOSIS — E039 Hypothyroidism, unspecified: Secondary | ICD-10-CM | POA: Insufficient documentation

## 2014-11-10 DIAGNOSIS — N189 Chronic kidney disease, unspecified: Secondary | ICD-10-CM | POA: Diagnosis not present

## 2014-11-10 DIAGNOSIS — N938 Other specified abnormal uterine and vaginal bleeding: Secondary | ICD-10-CM | POA: Diagnosis not present

## 2014-11-10 DIAGNOSIS — Z794 Long term (current) use of insulin: Secondary | ICD-10-CM | POA: Insufficient documentation

## 2014-11-10 DIAGNOSIS — I10 Essential (primary) hypertension: Secondary | ICD-10-CM

## 2014-11-10 DIAGNOSIS — Z7982 Long term (current) use of aspirin: Secondary | ICD-10-CM | POA: Diagnosis not present

## 2014-11-10 LAB — POCT GLYCOSYLATED HEMOGLOBIN (HGB A1C): Hemoglobin A1C: 6.3

## 2014-11-10 LAB — GLUCOSE, POCT (MANUAL RESULT ENTRY): POC GLUCOSE: 120 mg/dL — AB (ref 70–99)

## 2014-11-10 MED ORDER — ISOSORBIDE MONONITRATE ER 60 MG PO TB24: 90.0000 mg | ORAL_TABLET | Freq: Every day | ORAL | Status: DC

## 2014-11-10 MED ORDER — NITROGLYCERIN 0.4 MG SL SUBL: 0.4000 mg | SUBLINGUAL_TABLET | SUBLINGUAL | Status: DC | PRN

## 2014-11-10 MED ORDER — FUROSEMIDE 20 MG PO TABS: 10.0000 mg | ORAL_TABLET | Freq: Every day | ORAL | Status: DC

## 2014-11-10 NOTE — Progress Notes (Signed)
Patient ID: Brooke Stafford, female   DOB: 01-17-1956, 59 y.o.   MRN: [id number]   Brooke Stafford, is a 59 y.o. female  [id number]  [id number]  DOB - 1955/05/07  Chief Complaint  Patient presents with  . Follow-up        Subjective:   Brooke Stafford is a 59 y.o. female here today for 3 month follow up. Patient has extensive medical history including hypertension, diabetes mellitus requiring insulin, obesity, peripheral vascular disease, dyslipidemia, coronary artery disease status post CABG 6, major depression and anxiety. Patient denies any pain today. Patient reports her roommate went out of the country recently and the doctor told the roommate he/she has staph infection. Patient states she took some of the roommates medication prescribed for it because she did not know if she had it as well. Patient request to be checked to see if she has a staph infection. Patient also reports coughing up blood this morning and BP of 163/133. Patient states she took an extra clonidine to decrease her BP at 12pm. Patient reports having a normal period May 18th and June 30th, 2016 which she has not had one in over two years. Patient was concerned about this. Patient has taken her morning medications. Patient needs a refill on nitrostat. Patient reports she need a fluid pill as well. Patient has No headache, No chest pain, No abdominal pain - No Nausea, No new weakness tingling or numbness, No Cough - SOB.  No problems updated.  ALLERGIES: Allergies  Allergen Reactions  . Hydralazine Shortness Of Breath  . Penicillins Cross Reactors Hives    And high fever  . Adhesive [Tape] Rash    bruising    PAST MEDICAL HISTORY: Past Medical History  Diagnosis Date  . Asthma   . Anxiety   . GERD (gastroesophageal reflux disease)   . Coronary artery disease 2002    CABG x 6. Cath 5/11- med Rx  . Hypertension   . Peripheral vascular disease 12/12    LSFA PTA  . Anemia   . Hyperlipidemia   .  Chronic renal insufficiency, stage II (mild)     followed  by [facility]  . CAD (coronary artery disease) 2002; 2015    CABG x 6 2002, cath 2011- med Rx stent DES VG-Diag  . Hypothyroid     treated  . Obesity (BMI 35.0-39.9 without comorbidity)   . CHF (congestive heart failure)     "in 2002" (11/26/2012)  . Myocardial infarction 2000; 2002; 2011?  [name] Chronic bronchitis     "q year; in the winter" (08/04/2013)  . Type II diabetes mellitus   . Depression   . Pneumonia     "3 times I think" (08/04/2013)  . History of blood transfusion 2002    "when I had OHS"  . Migraines     "couple times/year" (08/04/2013)  . Headache     "~ q week" (08/04/2013)  . Arthritis     "stiff fingers and knees" (08/04/2013)  . Gout     "right big toe"  . Anginal pain   . CAD (coronary artery disease) of artery bypass graft; DES to VG-Diag 09/28/13 11/09/2013    MEDICATIONS AT HOME: Prior to Admission medications   Medication Sig Start Date End Date Taking? Authorizing Provider  acetaminophen-codeine (TYLENOL #3) 300-30 MG per tablet Take 1 tablet by mouth every 4 (four) hours as needed. 10/12/14  Yes Tresa Garter, MD  albuterol (PROVENTIL HFA;VENTOLIN HFA) 108 (90 BASE) MCG/ACT  inhaler Inhale 2 puffs into the lungs every 6 (six) hours as needed for wheezing or shortness of breath. 08/11/14  Yes Tresa Garter, MD  allopurinol (ZYLOPRIM) 300 MG tablet Take 1 tablet (300 mg total) by mouth daily. 08/11/14  Yes Tresa Garter, MD  amLODipine-benazepril (LOTREL) 5-10 MG per capsule Take 1 capsule by mouth daily.   Yes Historical Provider, MD  aspirin 81 MG chewable tablet Chew 1 tablet (81 mg total) by mouth daily. 09/29/13  Yes Brittainy Erie Noe, PA-C  atorvastatin (LIPITOR) 40 MG tablet Take 1 tablet (40 mg total) by mouth daily. 06/23/14  Yes Tresa Garter, MD  cloNIDine (CATAPRES) 0.2 MG tablet Take 1 tablet (0.2 mg total) by mouth 2 (two) times daily. 08/11/14  Yes Tresa Garter,  MD  clopidogrel (PLAVIX) 75 MG tablet Take 1 tablet (75 mg total) by mouth daily. 03/16/14  Yes Tresa Garter, MD  cyclobenzaprine (FLEXERIL) 10 MG tablet Take 1 tablet (10 mg total) by mouth 3 (three) times daily as needed for muscle spasms. 01/27/14  Yes Tresa Garter, MD  fluticasone (FLONASE) 50 MCG/ACT nasal spray Place 2 sprays into both nostrils daily as needed for allergies or rhinitis. 08/11/14  Yes Tresa Garter, MD  gabapentin (NEURONTIN) 400 MG capsule Take 1 capsule (400 mg total) by mouth 3 (three) times daily. 01/27/14  Yes Tresa Garter, MD  hydrocortisone cream 0.5 % Apply 1 application topically 2 (two) times daily. 01/27/14  Yes Kateryna Grantham Essie Christine, MD  insulin aspart (NOVOLOG FLEXPEN) 100 UNIT/ML FlexPen Inject 25-45 Units into the skin 3 (three) times daily with meals. Inject 35 units subq at breakfast, inject 25 units subq at lunch and inject 40 units subq at supper. 09/13/14  Yes Philemon Kingdom, MD  Insulin Glargine (TOUJEO SOLOSTAR) 300 UNIT/ML SOPN Inject 50 Units into the skin daily. 10/12/14  Yes Philemon Kingdom, MD  Insulin Pen Needle 32G X 4 MM MISC Use to inject insulin daily as instructed. 12/07/13  Yes Philemon Kingdom, MD  Insulin Syringes, Disposable, (B-D INSULIN SYRINGE 1CC) U-100 1 ML MISC Use to inject insulin 4 times daily as instructed. 02/22/14  Yes Philemon Kingdom, MD  levothyroxine (SYNTHROID, LEVOTHROID) 25 MCG tablet take 1 tablet by mouth every morning BEFORE BREAKFAST 11/07/14  Yes Lorayne Marek, MD  loratadine (CLARITIN) 10 MG tablet Take 1 tablet (10 mg total) by mouth daily. 09/21/13  Yes Lorayne Marek, MD  Menthol-Camphor (TIGER BALM ARTHRITIS RUB) 11-11 % CREA Apply 1 application topically 2 (two) times daily.   Yes Historical Provider, MD  metoprolol succinate (TOPROL-XL) 100 MG 24 hr tablet take 1 tablet by mouth once daily WITH OR IMMEADIATELY OLLOWING A MEAL 09/19/14  Yes Mihai Croitoru, MD  metoprolol succinate (TOPROL-XL) 50 MG  24 hr tablet Take 3 tablets (150 mg total) by mouth daily. Take with or immediately following a meal. 01/25/14  Yes Brett Canales, PA-C  nitroGLYCERIN (NITROSTAT) 0.4 MG SL tablet Place 1 tablet (0.4 mg total) under the tongue every 5 (five) minutes as needed. For chest 11/10/14  Yes Tresa Garter, MD  polyethylene glycol (MIRALAX / GLYCOLAX) packet Take 17 g by mouth daily as needed for moderate constipation.   Yes Historical Provider, MD  traZODone (DESYREL) 50 MG tablet Take 0.5 tablets (25 mg total) by mouth at bedtime. 05/16/14  Yes Tresa Garter, MD  ZETIA 10 MG tablet take 1 tablet by mouth once daily 08/22/14  Yes Brittainy Erie Noe, PA-C  furosemide (LASIX) 20 MG tablet Take 0.5 tablets (10 mg total) by mouth daily. 11/10/14   Tresa Garter, MD  isosorbide mononitrate (IMDUR) 60 MG 24 hr tablet Take 1.5 tablets (90 mg total) by mouth daily. 11/10/14   Tresa Garter, MD     Objective:   Filed Vitals:   11/10/14 1425  BP: 146/70  Pulse: 63  Temp: 98.3 F (36.8 C)  TempSrc: Oral  Resp: 18  Height: 5' 10.5" (1.791 m)  Weight: 238 lb 12.8 oz (108.319 kg)  SpO2: 96%    Exam General appearance : Awake, alert, not in any distress. Speech Clear. Not toxic looking, obese HEENT: Atraumatic and Normocephalic, pupils equally reactive to light and accomodation Neck: supple, no JVD. No cervical lymphadenopathy.  Chest:Good air entry bilaterally, no added sounds, midline surgical scar CVS: S1 S2 regular, no murmurs. Abdomen: Bowel sounds present, Non tender and not distended with no gaurding, rigidity or rebound. Extremities: Right Lower Ext shows 1+ edema, both legs are warm to touch Neurology: Awake alert, and oriented X 3, CN II-XII intact, Non focal  Data Review Lab Results  Component Value Date   HGBA1C 6.30 11/10/2014   HGBA1C 6.5 08/09/2014   HGBA1C 9.1* 05/03/2014     Assessment & Plan   1. Type 2 diabetes mellitus with diabetic chronic kidney  disease  - Glucose (CBG) - HgB A1c  Aim for 30 minutes of exercise most days. Rethink what you drink. Water is great! Aim for 2-3 Carb Choices per meal (30-45 grams) +/- 1 either way  Aim for 0-15 Carbs per snack if hungry  Include protein in moderation with your meals and snacks  Consider reading food labels for Total Carbohydrate and Fat Grams of foods  Consider checking BG at alternate times per day  Continue taking medication as directed Be mindful about how much sugar you are adding to beverages and other foods. Try to decrease. Consider splenda. Fruit Punch - find one with no sugar  Measure and decrease portions of carbohydrate foods  Make your plate and don't go back for seconds  2. Essential hypertension  - nitroGLYCERIN (NITROSTAT) 0.4 MG SL tablet; Place 1 tablet (0.4 mg total) under the tongue every 5 (five) minutes as needed. For chest  Dispense: 30 tablet; Refill: 6 - isosorbide mononitrate (IMDUR) 60 MG 24 hr tablet; Take 1.5 tablets (90 mg total) by mouth daily.  Dispense: 90 tablet; Refill: 5 - We have discussed target BP range and blood pressure goal - I have advised patient to check BP regularly and to call us back or report to clinic if the numbers are consistently higher than 140/90  - We discussed the importance of compliance with medical therapy and DASH diet recommended, consequences of uncontrolled hypertension discussed.  - continue current BP medications  3. Edema of right lower extremity  - furosemide (LASIX) 20 MG tablet; Take 0.5 tablets (10 mg total) by mouth daily.  Dispense: 30 tablet; Refill: 0 - Echocardiogram; Future  4. ?Postmenopausal dysfunctional uterine bleeding Patient will be referred to gynecologist for possible endometrial sampling/biopsy  Patient have been counseled extensively about nutrition and exercise  Return in about 3 months (around 02/10/2015) for Hemoglobin A1C and Follow up, DM, Follow up HTN.  The patient was given  clear instructions to go to ER or return to medical center if symptoms don't improve, worsen or new problems develop. The patient verbalized understanding. The patient was told to call to get lab results if they  haven't heard anything in the next week.   This note has been created with Surveyor, quantity. Any transcriptional errors are unintentional.    Angelica Chessman, MD, [place], [place], [place], [place] and [place], [place]   11/10/2014, 3:00 PM

## 2014-11-10 NOTE — Patient Instructions (Signed)
DASH Eating Plan DASH stands for "Dietary Approaches to Stop Hypertension." The DASH eating plan is a healthy eating plan that has been shown to reduce high blood pressure (hypertension). Additional health benefits may include reducing the risk of type 2 diabetes mellitus, heart disease, and stroke. The DASH eating plan may also help with weight loss. WHAT DO I NEED TO KNOW ABOUT THE DASH EATING PLAN? For the DASH eating plan, you will follow these general guidelines:  Choose foods with a percent daily value for sodium of less than 5% (as listed on the food label).  Use salt-free seasonings or herbs instead of table salt or sea salt.  Check with your health care provider or pharmacist before using salt substitutes.  Eat lower-sodium products, often labeled as "lower sodium" or "no salt added."  Eat fresh foods.  Eat more vegetables, fruits, and low-fat dairy products.  Choose whole grains. Look for the word "whole" as the first word in the ingredient list.  Choose fish and skinless chicken or turkey more often than red meat. Limit fish, poultry, and meat to 6 oz (170 g) each day.  Limit sweets, desserts, sugars, and sugary drinks.  Choose heart-healthy fats.  Limit cheese to 1 oz (28 g) per day.  Eat more home-cooked food and less restaurant, buffet, and fast food.  Limit fried foods.  Cook foods using methods other than frying.  Limit canned vegetables. If you do use them, rinse them well to decrease the sodium.  When eating at a restaurant, ask that your food be prepared with less salt, or no salt if possible. WHAT FOODS CAN I EAT? Seek help from a dietitian for individual calorie needs. Grains Whole grain or whole wheat bread. Brown rice. Whole grain or whole wheat pasta. Quinoa, bulgur, and whole grain cereals. Low-sodium cereals. Corn or whole wheat flour tortillas. Whole grain cornbread. Whole grain crackers. Low-sodium crackers. Vegetables Fresh or frozen vegetables  (raw, steamed, roasted, or grilled). Low-sodium or reduced-sodium tomato and vegetable juices. Low-sodium or reduced-sodium tomato sauce and paste. Low-sodium or reduced-sodium canned vegetables.  Fruits All fresh, canned (in natural juice), or frozen fruits. Meat and Other Protein Products Ground beef (85% or leaner), grass-fed beef, or beef trimmed of fat. Skinless chicken or turkey. Ground chicken or turkey. Pork trimmed of fat. All fish and seafood. Eggs. Dried beans, peas, or lentils. Unsalted nuts and seeds. Unsalted canned beans. Dairy Low-fat dairy products, such as skim or 1% milk, 2% or reduced-fat cheeses, low-fat ricotta or cottage cheese, or plain low-fat yogurt. Low-sodium or reduced-sodium cheeses. Fats and Oils Tub margarines without trans fats. Light or reduced-fat mayonnaise and salad dressings (reduced sodium). Avocado. Safflower, olive, or canola oils. Natural peanut or almond butter. Other Unsalted popcorn and pretzels. The items listed above may not be a complete list of recommended foods or beverages. Contact your dietitian for more options. WHAT FOODS ARE NOT RECOMMENDED? Grains White bread. White pasta. White rice. Refined cornbread. Bagels and croissants. Crackers that contain trans fat. Vegetables Creamed or fried vegetables. Vegetables in a cheese sauce. Regular canned vegetables. Regular canned tomato sauce and paste. Regular tomato and vegetable juices. Fruits Dried fruits. Canned fruit in light or heavy syrup. Fruit juice. Meat and Other Protein Products Fatty cuts of meat. Ribs, chicken wings, bacon, sausage, bologna, salami, chitterlings, fatback, hot dogs, bratwurst, and packaged luncheon meats. Salted nuts and seeds. Canned beans with salt. Dairy Whole or 2% milk, cream, half-and-half, and cream cheese. Whole-fat or sweetened yogurt. Full-fat   cheeses or blue cheese. Nondairy creamers and whipped toppings. Processed cheese, cheese spreads, or cheese  curds. Condiments Onion and garlic salt, seasoned salt, table salt, and sea salt. Canned and packaged gravies. Worcestershire sauce. Tartar sauce. Barbecue sauce. Teriyaki sauce. Soy sauce, including reduced sodium. Steak sauce. Fish sauce. Oyster sauce. Cocktail sauce. Horseradish. Ketchup and mustard. Meat flavorings and tenderizers. Bouillon cubes. Hot sauce. Tabasco sauce. Marinades. Taco seasonings. Relishes. Fats and Oils Butter, stick margarine, lard, shortening, ghee, and bacon fat. Coconut, palm kernel, or palm oils. Regular salad dressings. Other Pickles and olives. Salted popcorn and pretzels. The items listed above may not be a complete list of foods and beverages to avoid. Contact your dietitian for more information. WHERE CAN I FIND MORE INFORMATION? National Heart, Lung, and Blood Institute: travelstabloid.com Document Released: 03/21/2011 Document Revised: 08/16/2013 Document Reviewed: 02/03/2013 [facility] Patient Information 2015 [place], [place]. This information is not intended to replace advice given to you by your health care provider. Make sure you discuss any questions you have with your health care provider. Hypertension Hypertension, commonly called high blood pressure, is when the force of blood pumping through your arteries is too strong. Your arteries are the blood vessels that carry blood from your heart throughout your body. A blood pressure reading consists of a higher number over a lower number, such as 110/72. The higher number (systolic) is the pressure inside your arteries when your heart pumps. The lower number (diastolic) is the pressure inside your arteries when your heart relaxes. Ideally you want your blood pressure below 120/80. Hypertension forces your heart to work harder to pump blood. Your arteries may become narrow or stiff. Having hypertension puts you at risk for heart disease, stroke, and other problems.  RISK  FACTORS Some risk factors for high blood pressure are controllable. Others are not.  Risk factors you cannot control include:   Race. You may be at higher risk if you are African American.  Age. Risk increases with age.  Gender. Men are at higher risk than women before age 37 years. After age 55, women are at higher risk than men. Risk factors you can control include:  Not getting enough exercise or physical activity.  Being overweight.  Getting too much fat, sugar, calories, or salt in your diet.  Drinking too much alcohol. SIGNS AND SYMPTOMS Hypertension does not usually cause signs or symptoms. Extremely high blood pressure (hypertensive crisis) may cause headache, anxiety, shortness of breath, and nosebleed. DIAGNOSIS  To check if you have hypertension, your health care provider will measure your blood pressure while you are seated, with your arm held at the level of your heart. It should be measured at least twice using the same arm. Certain conditions can cause a difference in blood pressure between your right and left arms. A blood pressure reading that is higher than normal on one occasion does not mean that you need treatment. If one blood pressure reading is high, ask your health care provider about having it checked again. TREATMENT  Treating high blood pressure includes making lifestyle changes and possibly taking medicine. Living a healthy lifestyle can help lower high blood pressure. You may need to change some of your habits. Lifestyle changes may include:  Following the DASH diet. This diet is high in fruits, vegetables, and whole grains. It is low in salt, red meat, and added sugars.  Getting at least 2 hours of brisk physical activity every week.  Losing weight if necessary.  Not smoking.  Limiting  alcoholic beverages.  Learning ways to reduce stress. If lifestyle changes are not enough to get your blood pressure under control, your health care provider may  prescribe medicine. You may need to take more than one. Work closely with your health care provider to understand the risks and benefits. HOME CARE INSTRUCTIONS  Have your blood pressure rechecked as directed by your health care provider.   Take medicines only as directed by your health care provider. Follow the directions carefully. Blood pressure medicines must be taken as prescribed. The medicine does not work as well when you skip doses. Skipping doses also puts you at risk for problems.   Do not smoke.   Monitor your blood pressure at home as directed by your health care provider. SEEK MEDICAL CARE IF:   You think you are having a reaction to medicines taken.  You have recurrent headaches or feel dizzy.  You have swelling in your ankles.  You have trouble with your vision. SEEK IMMEDIATE MEDICAL CARE IF:  You develop a severe headache or confusion.  You have unusual weakness, numbness, or feel faint.  You have severe chest or abdominal pain.  You vomit repeatedly.  You have trouble breathing. MAKE SURE YOU:   Understand these instructions.  Will watch your condition.  Will get help right away if you are not doing well or get worse. Document Released: 04/01/2005 Document Revised: 08/16/2013 Document Reviewed: 01/22/2013 [facility] Patient Information 2015 [place], [place]. This information is not intended to replace advice given to you by your health care provider. Make sure you discuss any questions you have with your health care provider. Basic Carbohydrate Counting for Diabetes Mellitus Carbohydrate counting is a method for keeping track of the amount of carbohydrates you eat. Eating carbohydrates naturally increases the level of sugar (glucose) in your blood, so it is important for you to know the amount that is okay for you to have in every meal. Carbohydrate counting helps keep the level of glucose in your blood within normal limits. The amount of carbohydrates  allowed is different for every person. A dietitian can help you calculate the amount that is right for you. Once you know the amount of carbohydrates you can have, you can count the carbohydrates in the foods you want to eat. Carbohydrates are found in the following foods:  Grains, such as breads and cereals.  Dried beans and soy products.  Starchy vegetables, such as potatoes, peas, and corn.  Fruit and fruit juices.  Milk and yogurt.  Sweets and snack foods, such as cake, cookies, candy, chips, soft drinks, and fruit drinks. CARBOHYDRATE COUNTING There are two ways to count the carbohydrates in your food. You can use either of the methods or a combination of both. Reading the "Nutrition Facts" on [place] The "Nutrition Facts" is an area that is included on the labels of almost all packaged food and beverages in the [place]. It includes the serving size of that food or beverage and information about the nutrients in each serving of the food, including the grams (g) of carbohydrate per serving.  Decide the number of servings of this food or beverage that you will be able to eat or drink. Multiply that number of servings by the number of grams of carbohydrate that is listed on the label for that serving. The total will be the amount of carbohydrates you will be having when you eat or drink this food or beverage. Pension scheme manager When you  eat food that is not packaged or does not include "Nutrition Facts" on the label, you need to measure the servings in order to count the amount of carbohydrates.A serving of most carbohydrate-rich foods contains about 15 g of carbohydrates. The following list includes serving sizes of carbohydrate-rich foods that provide 15 g ofcarbohydrate per serving:   1 slice of bread (1 oz) or 1 six-inch tortilla.    of a hamburger bun or English muffin.  4-6 crackers.   cup unsweetened dry cereal.    cup hot cereal.    cup rice or pasta.    cup mashed potatoes or  of a large baked potato.  1 cup fresh fruit or one small piece of fruit.    cup canned or frozen fruit or fruit juice.  1 cup milk.   cup plain fat-free yogurt or yogurt sweetened with artificial sweeteners.   cup cooked dried beans or starchy vegetable, such as peas, corn, or potatoes.  Decide the number of standard-size servings that you will eat. Multiply that number of servings by 15 (the grams of carbohydrates in that serving). For example, if you eat 2 cups of strawberries, you will have eaten 2 servings and 30 g of carbohydrates (2 servings x 15 g = 30 g). For foods such as soups and casseroles, in which more than one food is mixed in, you will need to count the carbohydrates in each food that is included. EXAMPLE OF CARBOHYDRATE COUNTING Sample Dinner  3 oz chicken breast.   cup of brown rice.   cup of corn.  1 cup milk.   1 cup strawberries with sugar-free whipped topping.  Carbohydrate Calculation Step 1: Identify the foods that contain carbohydrates:   Rice.   Corn.   Milk.   Strawberries. Step 2:Calculate the number of servings eaten of each:   2 servings of rice.   1 serving of corn.   1 serving of milk.   1 serving of strawberries. Step 3: Multiply each of those number of servings by 15 g:   2 servings of rice x 15 g = 30 g.   1 serving of corn x 15 g = 15 g.   1 serving of milk x 15 g = 15 g.   1 serving of strawberries x 15 g = 15 g. Step 4: Add together all of the amounts to find the total grams of carbohydrates eaten: 30 g + 15 g + 15 g + 15 g = 75 g. Document Released: 04/01/2005 Document Revised: 08/16/2013 Document Reviewed: 02/26/2013 [facility] Patient Information 2015 [place], [place]. This information is not intended to replace advice given to you by your health care provider. Make sure you discuss any questions you have with your health care provider. Diabetes and  Exercise Exercising regularly is important. It is not just about losing weight. It has many health benefits, such as:  Improving your overall fitness, flexibility, and endurance.  Increasing your bone density.  Helping with weight control.  Decreasing your body fat.  Increasing your muscle strength.  Reducing stress and tension.  Improving your overall health. People with diabetes who exercise gain additional benefits because exercise:  Reduces appetite.  Improves the body's use of blood sugar (glucose).  Helps lower or control blood glucose.  Decreases blood pressure.  Helps control blood lipids (such as cholesterol and triglycerides).  Improves the body's use of the hormone insulin by:  Increasing the body's insulin sensitivity.  Reducing the body's insulin needs.  Decreases  the risk for heart disease because exercising:  Lowers cholesterol and triglycerides levels.  Increases the levels of good cholesterol (such as high-density lipoproteins [HDL]) in the body.  Lowers blood glucose levels. YOUR ACTIVITY PLAN  Choose an activity that you enjoy and set realistic goals. Your health care provider or diabetes educator can help you make an activity plan that works for you. Exercise regularly as directed by your health care provider. This includes:  Performing resistance training twice a week such as push-ups, sit-ups, lifting weights, or using resistance bands.  Performing 150 minutes of cardio exercises each week such as walking, running, or playing sports.  Staying active and spending no more than 90 minutes at one time being inactive. Even short bursts of exercise are good for you. Three 10-minute sessions spread throughout the day are just as beneficial as a single 30-minute session. Some exercise ideas include:  Taking the dog for a walk.  Taking the stairs instead of the elevator.  Dancing to your favorite song.  Doing an exercise video.  Doing your  favorite exercise with a friend. RECOMMENDATIONS FOR EXERCISING WITH TYPE 1 OR TYPE 2 DIABETES   Check your blood glucose before exercising. If blood glucose levels are greater than 240 mg/dL, check for urine ketones. Do not exercise if ketones are present.  Avoid injecting insulin into areas of the body that are going to be exercised. For example, avoid injecting insulin into:  The arms when playing tennis.  The legs when jogging.  Keep a record of:  Food intake before and after you exercise.  Expected peak times of insulin action.  Blood glucose levels before and after you exercise.  The type and amount of exercise you have done.  Review your records with your health care provider. Your health care provider will help you to develop guidelines for adjusting food intake and insulin amounts before and after exercising.  If you take insulin or oral hypoglycemic agents, watch for signs and symptoms of hypoglycemia. They include:  Dizziness.  Shaking.  Sweating.  Chills.  Confusion.  Drink plenty of water while you exercise to prevent dehydration or heat stroke. Body water is lost during exercise and must be replaced.  Talk to your health care provider before starting an exercise program to make sure it is safe for you. Remember, almost any type of activity is better than none. Document Released: 06/22/2003 Document Revised: 08/16/2013 Document Reviewed: 09/08/2012 [facility] Patient Information 2015 [place], [place]. This information is not intended to replace advice given to you by your health care provider. Make sure you discuss any questions you have with your health care provider.

## 2014-11-10 NOTE — Progress Notes (Signed)
Patient here for 3 month follow up. Patient denies any pain today. Patient reports her roommate went out of the country recently and the doctor told the roommate he/she has staph infection. Patient states she took some of the roommates medication prescribed for it because she did not know if she had it as well. Patient request to be checked to see if she has a staph infection. Patient also reports coughing up blood this morning and BP of 163/133. Patient states she took an extra clonidine to decrease her BP at 12pm. Patient reports having a normal period May 18th and June 30th, 2016 which she has not had one in over two years. Patient was concerned about this. Patient has taken her morning medications. Patient needs a refill on nitrostat. Patient reports she need a fluid pill as well.

## 2014-11-14 ENCOUNTER — Ambulatory Visit (HOSPITAL_COMMUNITY)
Admission: RE | Admit: 2014-11-14 | Discharge: 2014-11-14 | Disposition: A | Discharge status: Home / Self Care | Payer: Medicare Other | Source: Ambulatory Visit | Attending: Internal Medicine | Admitting: Internal Medicine

## 2014-11-14 DIAGNOSIS — E119 Type 2 diabetes mellitus without complications: Secondary | ICD-10-CM | POA: Diagnosis not present

## 2014-11-14 DIAGNOSIS — I517 Cardiomegaly: Secondary | ICD-10-CM | POA: Diagnosis not present

## 2014-11-14 DIAGNOSIS — I10 Essential (primary) hypertension: Secondary | ICD-10-CM | POA: Insufficient documentation

## 2014-11-14 DIAGNOSIS — I5020 Unspecified systolic (congestive) heart failure: Secondary | ICD-10-CM | POA: Diagnosis not present

## 2014-11-14 DIAGNOSIS — R600 Localized edema: Secondary | ICD-10-CM

## 2014-11-14 DIAGNOSIS — I509 Heart failure, unspecified: Secondary | ICD-10-CM

## 2014-11-14 DIAGNOSIS — I340 Nonrheumatic mitral (valve) insufficiency: Secondary | ICD-10-CM | POA: Insufficient documentation

## 2014-11-14 DIAGNOSIS — E785 Hyperlipidemia, unspecified: Secondary | ICD-10-CM | POA: Insufficient documentation

## 2014-11-14 NOTE — Progress Notes (Signed)
  Echocardiogram 2D Echocardiogram has been performed.  Darlina Sicilian M 11/14/2014, 3:47 PM

## 2014-11-15 ENCOUNTER — Encounter: Payer: Self-pay | Admitting: Internal Medicine

## 2014-11-15 ENCOUNTER — Telehealth: Payer: Self-pay | Admitting: Clinical

## 2014-11-15 ENCOUNTER — Telehealth: Payer: Self-pay

## 2014-11-15 NOTE — Telephone Encounter (Signed)
Nurse called pharmacy to clarify isosorbide medication. Per Lissa Merlin at [facility], pharmacy called to verify isosorbide dosage. Dosage should be 90mg  total. Nurse called patient, reached voicemail. Left message for patient to call Hena Ewalt with [facility], at [phone].

## 2014-11-15 NOTE — Telephone Encounter (Signed)
Attempt to f/u w pt; left HIPPA-compliant message to return call to [place] at [phone]

## 2014-11-15 NOTE — Telephone Encounter (Signed)
Pt returned call; says she will come in to talk to [facility] before her next PCP appointment

## 2014-11-15 NOTE — Telephone Encounter (Signed)
Patient called nurse, verified date of birth. Patient verified she is taking isosorbide 90mg . Patient will call nurse if pharmacy has any issues with prescription.  Patient voices understanding and has no further questions at this time.

## 2014-11-16 ENCOUNTER — Telehealth: Payer: Self-pay

## 2014-11-16 NOTE — Telephone Encounter (Signed)
-----   Message from Brooke Garter, MD sent at 11/16/2014 11:57 AM EDT ----- Please inform patient that her heart ultrasound shows reduced activity as compared to last year, advised patient to make an appointment with her cardiologist as soon as possible for further evaluation. Continue Lasix and other medications for now. If patient does not have cardiologist, please schedule patient with Dr. Verl Blalock.

## 2014-11-16 NOTE — Telephone Encounter (Signed)
Nurse called patient, reached voicemail. Left message for patient to call Allea Kassner with [facility], at [phone].  

## 2014-11-17 NOTE — Telephone Encounter (Signed)
Nurse called patient, reached voicemail. Left message for patient to call Bonney Berres with [facility], at [phone].  

## 2014-11-17 NOTE — Telephone Encounter (Signed)
-----   Message from Tresa Garter, MD sent at 11/16/2014 11:57 AM EDT ----- Please inform patient that her heart ultrasound shows reduced activity as compared to last year, advised patient to make an appointment with her cardiologist as soon as possible for further evaluation. Continue Lasix and other medications for now. If patient does not have cardiologist, please schedule patient with Dr. Verl Blalock.

## 2014-11-18 ENCOUNTER — Telehealth: Payer: Self-pay | Admitting: *Deleted

## 2014-11-18 NOTE — Telephone Encounter (Signed)
1. Type of surgery: cataract extraction with intraocular lens implantation of the right eye followed by left eye 2. Date of surgery: 01/02/2015 3. Surgeon: not specified 4. Medications that need to be held & how long: none denoted 5. Fax and/or Phone: (p) [phone]  ([phone]     >> Attn: Sharyn Lull (surgical coordinator)  Patient has h/o CAD, CHF, DM, HTN, CKD Attached med list faxed Is patient medically stable to proceed with eye procedure: YES  Faxed to [facility]

## 2014-11-21 NOTE — Telephone Encounter (Signed)
Nurse called patient, reached voicemail. Left message for patient to call Karisma Meiser with [facility], at [phone]. Nurse will send letter.

## 2014-11-21 NOTE — Telephone Encounter (Signed)
-----   Message from Tresa Garter, MD sent at 11/16/2014 11:57 AM EDT ----- Please inform patient that her heart ultrasound shows reduced activity as compared to last year, advised patient to make an appointment with her cardiologist as soon as possible for further evaluation. Continue Lasix and other medications for now. If patient does not have cardiologist, please schedule patient with Dr. Verl Blalock.

## 2014-11-22 ENCOUNTER — Telehealth: Payer: Self-pay | Admitting: Internal Medicine

## 2014-11-22 NOTE — Telephone Encounter (Signed)
Patient is calling to speak with a nurse. Nira Conn has been trying to reach her since the 3rd of Aug. Please follow up with patient. Thank you.

## 2014-11-24 NOTE — Telephone Encounter (Signed)
Patient returned call to nurse, patient verified date of birth. Patient aware of heart ultrasound showing reduced activity compared to last year.  Patient has a cardiologist and agrees to make appointment as soon as possible to see cardiologist.  Patient agrees to continue lasix and other medications for now.  Patient voices understanding and has no further questions at this time.

## 2014-11-24 NOTE — Telephone Encounter (Signed)
Nurse called patient, reached voicemail. Left message for patient to call Alonzo Loving with [facility], at [phone].  

## 2014-11-25 ENCOUNTER — Other Ambulatory Visit: Payer: Self-pay | Admitting: Internal Medicine

## 2014-11-29 ENCOUNTER — Other Ambulatory Visit: Payer: Self-pay

## 2014-11-29 ENCOUNTER — Other Ambulatory Visit: Payer: Self-pay | Admitting: Internal Medicine

## 2014-11-29 ENCOUNTER — Encounter: Payer: Self-pay | Admitting: Obstetrics & Gynecology

## 2014-11-29 ENCOUNTER — Telehealth: Payer: Self-pay | Admitting: Cardiovascular Disease

## 2014-11-29 DIAGNOSIS — N950 Postmenopausal bleeding: Secondary | ICD-10-CM

## 2014-11-29 NOTE — Progress Notes (Signed)
Per Dr. Harolyn Rutherford, [place] scheduled before pt GYN appt.  Scheduled [place] 12/07/14 @ 0800.  Front desk to contact pt with [place] and GYN appt.

## 2014-11-29 NOTE — Telephone Encounter (Signed)
Closed encounter °

## 2014-12-01 ENCOUNTER — Other Ambulatory Visit: Payer: Self-pay | Admitting: *Deleted

## 2014-12-02 ENCOUNTER — Encounter: Payer: Self-pay | Admitting: Cardiovascular Disease

## 2014-12-02 ENCOUNTER — Other Ambulatory Visit: Payer: Self-pay | Admitting: *Deleted

## 2014-12-02 ENCOUNTER — Encounter: Payer: Self-pay | Admitting: Cardiology

## 2014-12-02 ENCOUNTER — Ambulatory Visit (INDEPENDENT_AMBULATORY_CARE_PROVIDER_SITE_OTHER): Payer: Medicare Other | Admitting: Cardiovascular Disease

## 2014-12-02 VITALS — BP 158/80 | HR 70 | Resp 16 | Ht 70.0 in | Wt 236.0 lb

## 2014-12-02 DIAGNOSIS — R600 Localized edema: Secondary | ICD-10-CM | POA: Diagnosis not present

## 2014-12-02 DIAGNOSIS — I5040 Unspecified combined systolic (congestive) and diastolic (congestive) heart failure: Secondary | ICD-10-CM | POA: Diagnosis not present

## 2014-12-02 DIAGNOSIS — I25709 Atherosclerosis of coronary artery bypass graft(s), unspecified, with unspecified angina pectoris: Secondary | ICD-10-CM | POA: Diagnosis not present

## 2014-12-02 DIAGNOSIS — I255 Ischemic cardiomyopathy: Secondary | ICD-10-CM | POA: Diagnosis not present

## 2014-12-02 DIAGNOSIS — I5041 Acute combined systolic (congestive) and diastolic (congestive) heart failure: Secondary | ICD-10-CM

## 2014-12-02 DIAGNOSIS — R9439 Abnormal result of other cardiovascular function study: Secondary | ICD-10-CM

## 2014-12-02 MED ORDER — FUROSEMIDE 20 MG PO TABS: 10.0000 mg | ORAL_TABLET | Freq: Every day | ORAL | Status: DC

## 2014-12-02 NOTE — Progress Notes (Signed)
Patient ID: Brooke Stafford, female   DOB: 1955-09-16, 59 y.o.   MRN: [id number]      Cardiology Office Note   Date:  12/02/2014   ID:  Brooke, Stafford 1955-07-14, MRN [id number]  PCP:  Brooke Chessman, MD  Cardiologist:   Brooke Klein, MD   Chief Complaint  Patient presents with  . OFFICE VISIT ABNORMAL ECHO    Patient had a head ache all last night. She has also felt light headed, dizzy, SOB, and her stomach is swollen.      History of Present Illness: Brooke Stafford is a 59 y.o. female who presents for  Follow-up after an echocardiogram showed reduction left ventricular systolic function. She saw her primary care physician about 3 weeks ago with complaints of worsening edema,  abdominal fullness and shortness of breath. She has not had typical angina pectoris. After an increased dose of diuretic she is feeling better.  Dyspnea has improved, lower extremity edema has resolved, some degree of abdominal fullness persists.  Her echo shows LV EF has dropped from normal down to 35-40 percent.  No regional wall motion abnormalities were described, but Doppler velocity suggested elevated filling pressures.  Roughly one year ago both echocardiogram and nuclear scintigraphy showed normal left ventricular systolic function.  The nuclear study did show mild inferior ischemia ( a previous nuclear study in 2014 had shown a small inferior scar, no ischemia).  Dr. Martinique, June 2015: coronary angiography showed severe disease in the native coronary arteries, patent LIMA to LAD, patent SVG to PDA , severely stenosed SVG to diagonal which was treated with a 3.5 x 23 mm Xience Alpine drug-eluting stent , chronic occlusion of the SVG to the intermediate branch.  There was a stenosis in the ramus intermedius artery that was felt to be amenable to PCI , but left for medical therapy since it was not the culprit for acute coronary syndrome and also to avoid the risk of contrast-induced  nephropathy.  She has a past medical history significant for severe type 2 diabetes mellitus requiring insulin therapy, systemic hypertension, hyperlipidemia, obesity, PVD S/P LSFA PTA initally in 2012 and again in 2014, coronary artery disease, S/P CABG x 6 in 2002. In July 2011 when she presented with sepsis and medication non compliance and had a NSTEMI from demand ischemia: Cath  showed a patent LIMA to LAD, patent SVG to diagonal, and patent sequential SVG to branches of the right coronary artery, chronic occlusion of SVG-ramus intermedius. She also has a mild ischemic cardiomyopathy with a left ventricular ejection fraction of 45% to 50% by echo 5/11.  In May 2015 both echo and nuclear stress testing showed normal LV EF around 60%. June 2015 presented with crescendo angina and received a stent to the SVG to diagonal artery (3.5 x 23 mm Xience Alpine).  No change in other coronary/graft anatomy, persistent stenosis in the ramus intermedius artery left for medical therapy. Echo in July 2016 showed reduction in EF to 35-40 %, associated with heart failure symptoms.  Past Medical History  Diagnosis Date  . Asthma   . Anxiety   . GERD (gastroesophageal reflux disease)   . Coronary artery disease 2002    CABG x 6. Cath 5/11- med Rx  . Hypertension   . Peripheral vascular disease 12/12    LSFA PTA  . Anemia   . Hyperlipidemia   . Chronic renal insufficiency, stage II (mild)     followed  by [facility]  .  CAD (coronary artery disease) 2002; 2015    CABG x 6 2002, cath 2011- med Rx stent DES VG-Diag  . Hypothyroid     treated  . Obesity (BMI 35.0-39.9 without comorbidity)   . CHF (congestive heart failure)     "in 2002" (11/26/2012)  . Myocardial infarction 2000; 2002; 2011?  [name] Chronic bronchitis     "q year; in the winter" (08/04/2013)  . Type II diabetes mellitus   . Depression   . Pneumonia     "3 times I think" (08/04/2013)  . History of blood transfusion 2002    "when I had OHS"   . Migraines     "couple times/year" (08/04/2013)  . Headache     "~ q week" (08/04/2013)  . Arthritis     "stiff fingers and knees" (08/04/2013)  . Gout     "right big toe"  . Anginal pain   . CAD (coronary artery disease) of artery bypass graft; DES to VG-Diag 09/28/13 11/09/2013    Past Surgical History  Procedure Laterality Date  . Cholecystectomy  1982  . Cesarean section  1978; 1980  . Tubal ligation  1980  . Breast cyst excision Right 1970's  . Coronary artery bypass graft  11/20/2000    x6 LIMA to distal LAD, svg to first diag, svg to ramus intermediate branch and swquential SVG to cir marginal branch, SVG to posterior descending coronary and sequential SVG to first right posterolateral branch  . Cardiac catheterization  2002  . Coronary angioplasty with stent placement  2004; 2012    "I have 2 stents" (08/04/2013)  . Peripheral arterial stent graft Left     SFA/notes 04/07/2011 (11/30/2012)  . Nm myocar perf wall motion  08/27/2004    negative  . Appendectomy  1980  . Coronary angioplasty with stent placement  09/28/13    PTCA/ DES Xience stent to VG-Diag   . Abdominal aortagram N/A 04/05/2011    Procedure: ABDOMINAL AORTAGRAM;  Surgeon: Lorretta Harp, MD;  [facility] C[facility] LAB;  Service: Cardiovascular;  Laterality: N/A;  . Renal angiogram N/A 04/05/2011    Procedure: RENAL ANGIOGRAM;  Surgeon: Lorretta Harp, MD;  Location: [facility] CATH LAB;  Service: Cardiovascular;  Laterality: N/A;  . Lower extremity angiogram  12/01/2012    Procedure: LOWER EXTREMITY ANGIOGRAM;  Surgeon: Lorretta Harp, MD;  Location: [facility] CATH LAB;  Service: Cardiovascular;;  . Percutaneous stent intervention Left 12/01/2012    Procedure: PERCUTANEOUS STENT INTERVENTION;  Surgeon: Lorretta Harp, MD;  Location: [facility] CATH LAB;  Service: Cardiovascular;  Laterality: Left;  Left SFA  . Left heart catheterization with coronary/graft angiogram N/A 09/28/2013    Procedure: LEFT HEART CATHETERIZATION WITH  Beatrix Fetters;  Surgeon: Peter M Martinique, MD;  Location: [facility] CATH LAB;  Service: Cardiovascular;  Laterality: N/A;     Current Outpatient Prescriptions  Medication Sig Dispense Refill  . acetaminophen-codeine (TYLENOL #3) 300-30 MG per tablet Take 1 tablet by mouth every 4 (four) hours as needed. 60 tablet 0  . albuterol (PROVENTIL HFA;VENTOLIN HFA) 108 (90 BASE) MCG/ACT inhaler Inhale 2 puffs into the lungs every 6 (six) hours as needed for wheezing or shortness of breath. 3 Inhaler 3  . allopurinol (ZYLOPRIM) 300 MG tablet Take 1 tablet (300 mg total) by mouth daily. 90 tablet 3  . aspirin 81 MG chewable tablet Chew 1 tablet (81 mg total) by mouth daily.    [name] atorvastatin (LIPITOR) 40 MG tablet Take 1 tablet (40 mg  total) by mouth daily. 90 tablet 3  . cloNIDine (CATAPRES) 0.2 MG tablet Take 1 tablet (0.2 mg total) by mouth 2 (two) times daily. 180 tablet 3  . clopidogrel (PLAVIX) 75 MG tablet Take 1 tablet (75 mg total) by mouth daily. 30 tablet 6  . cyclobenzaprine (FLEXERIL) 10 MG tablet Take 1 tablet (10 mg total) by mouth 3 (three) times daily as needed for muscle spasms. 60 tablet 3  . fluticasone (FLONASE) 50 MCG/ACT nasal spray Place 2 sprays into both nostrils daily as needed for allergies or rhinitis. 16 g 3  . furosemide (LASIX) 20 MG tablet Take 0.5 tablets (10 mg total) by mouth daily. 30 tablet 0  . gabapentin (NEURONTIN) 400 MG capsule Take 1 capsule (400 mg total) by mouth 3 (three) times daily. 270 capsule 3  . insulin aspart (NOVOLOG FLEXPEN) 100 UNIT/ML FlexPen Inject 25-45 Units into the skin 3 (three) times daily with meals. Inject 35 units subq at breakfast, inject 25 units subq at lunch and inject 40 units subq at supper. 30 mL 2  . Insulin Glargine (TOUJEO SOLOSTAR) 300 UNIT/ML SOPN Inject 50 Units into the skin daily. 6 pen 1  . Insulin Pen Needle 32G X 4 MM MISC Use to inject insulin daily as instructed. 150 each 11  . Insulin Syringes, Disposable, (B-D  INSULIN SYRINGE 1CC) U-100 1 ML MISC Use to inject insulin 4 times daily as instructed. 160 each 4  . isosorbide mononitrate (IMDUR) 60 MG 24 hr tablet Take 1.5 tablets (90 mg total) by mouth daily. 90 tablet 5  . levothyroxine (SYNTHROID, LEVOTHROID) 25 MCG tablet take 1 tablet by mouth every morning BEFORE BREAKFAST 30 tablet 2  . loratadine (CLARITIN) 10 MG tablet Take 1 tablet (10 mg total) by mouth daily. 30 tablet 11  . Menthol-Camphor (TIGER BALM ARTHRITIS RUB) 11-11 % CREA Apply 1 application topically 2 (two) times daily.    . metoprolol succinate (TOPROL-XL) 50 MG 24 hr tablet Take 3 tablets (150 mg total) by mouth daily. Take with or immediately following a meal. 90 tablet 5  . nitroGLYCERIN (NITROSTAT) 0.4 MG SL tablet Place 1 tablet (0.4 mg total) under the tongue every 5 (five) minutes as needed. For chest 30 tablet 6  . traZODone (DESYREL) 50 MG tablet Take 0.5 tablets (25 mg total) by mouth at bedtime. 45 tablet 3  . ZETIA 10 MG tablet take 1 tablet by mouth once daily 90 tablet 2   No current facility-administered medications for this visit.    Allergies:   Hydralazine; Penicillins cross reactors; and Adhesive    Social History:  The patient  reports that she quit smoking about 14 years ago. Her smoking use included Cigarettes. She has a 25 pack-year smoking history. She has never used smokeless tobacco. She reports that she drinks alcohol. She reports that she does not use illicit drugs.   Family History:  The patient's family history includes Diabetes in her mother and sister; Hyperlipidemia in her sister; Hypertension in her brother, father, mother, and sister; Stroke in her mother.    ROS:  Please see the history of present illness.    Otherwise, review of systems positive for  headaches, dizziness.   All other systems are reviewed and negative.    PHYSICAL EXAM: VS:  BP 158/80 mmHg  Pulse 70  Resp 16  Ht 5\' 10"  (1.778 m)  Wt 236 lb (107.049 kg)  BMI 33.86 kg/m2   LMP 10/13/2014 (Exact Date) , BMI Body  mass index is 33.86 kg/(m^2).  General: Alert, oriented x3, no distress Head: no evidence of trauma, PERRL, EOMI, no exophtalmos or lid lag, no myxedema, no xanthelasma; normal ears, nose and oropharynx Neck: normal jugular venous pulsations and no hepatojugular reflux; brisk carotid pulses without delay and no carotid bruits Chest: clear to auscultation, no signs of consolidation by percussion or palpation, normal fremitus, symmetrical and full respiratory excursions Cardiovascular: normal position and quality of the apical impulse, regular rhythm, normal first and second heart sounds, no murmurs, rubs or gallops Abdomen: no tenderness or distention, no masses by palpation, no abnormal pulsatility or arterial bruits, normal bowel sounds, no hepatosplenomegaly Extremities: no clubbing, cyanosis or edema; 2+ radial, ulnar and brachial pulses bilaterally; 2+ right femoral, posterior tibial and dorsalis pedis pulses; 2+ left femoral, posterior tibial and dorsalis pedis pulses; no subclavian or femoral bruits Neurological: grossly nonfocal Psych: euthymic mood, full affect  Recent Labs: 01/25/2014: Pro B Natriuretic peptide (BNP) 1369.0* 08/08/2014: BUN 9; Creatinine, Ser 1.36*; Potassium 3.8; Sodium 141 08/11/2014: Hemoglobin 13.5; Platelets 251    Lipid Panel    Component Value Date/Time   CHOL 180 08/04/2013 0430   TRIG 230* 08/04/2013 0430   HDL 35* 08/04/2013 0430   CHOLHDL 5.1 08/04/2013 0430   VLDL 46* 08/04/2013 0430   LDLCALC 99 08/04/2013 0430      Wt Readings from Last 3 Encounters:  12/02/14 236 lb (107.049 kg)  11/10/14 238 lb 12.8 oz (108.319 kg)  08/11/14 232 lb 12.8 oz (105.597 kg)     ASSESSMENT AND PLAN:  1.  Acute on chronic systolic and diastolic heart failure, with recent decrease in left ventricular systolic function I echo. Although no clear-cut regional abnormalities were described, the most likely etiology is new  ischemia. Although her HTN control is not perfect it is also not bad enough to justify reduction in LVEF. She is receiving beta blockers but is currently not on ACE inhibitor.  I last saw her she was taking amlodipine/benazepril but this has since been discontinued?.  It was also still on her medicine list when she last saw her PCP in July. We'll not restart ACE inhibitor is until after coronary angiography  2.  Coronary disease status post bypass surgery and previous DES to SVG to diagonal roughly one year ago. She is in the window for in-stent restenosis. He is also known to have a stenosis in the ramus intermedius artery , whose graft is closed. I'm not sure diagonal graft restenosis is enough to explain the drop in LVEF. I think she should undergo repeat angiography.  He is still taking aspirin and clopidogrel and is on a high-dose statin.  3.  Insulin requiring diabetes mellitus complicated by diabetic nephropathy/CKD stage 2.  If extensive revascularization procedures are necessary,  These may need to be performed as staged procedures to avoid contrast-induced nephrotoxicity. Also need to hold her diuretic before the procedure.  4.  Hyperlipidemia. Target LDL should be less than 70. Recheck labs.   Current medicines are reviewed at length with the patient today.  The patient does not have concerns regarding medicines.  The following changes have been made:  no change  Labs/ tests ordered today include:  Orders Placed This Encounter  Procedures  . APTT  . Protime-INR  . CBC  . Comprehensive metabolic panel  . LEFT HEART CATHETERIZATION WITH CORONARY ANGIOGRAM    Patient Instructions  Medication Instructions:   CONTINUE CURRENT MEDS  Labwork:  5-7 DAYS PRIOR TO THE  CARDIAC CATH AT [facility]  Testing/Procedures:  LEFT HEART CATHETERIZATION WITH GRAFTS AND POSSIBLE PCI  Follow-Up:  INSTRUCTIONS WILL BE GIVEN AT [place]         Signed, Brooke Klein, MD  12/02/2014 12:09 PM    Brooke Klein, MD, [facility] [phone] office [phone] pager

## 2014-12-02 NOTE — Patient Instructions (Signed)
Medication Instructions:   CONTINUE CURRENT MEDS  Labwork:  5-7 DAYS PRIOR TO THE CARDIAC CATH AT [facility]  Testing/Procedures:  LEFT HEART CATHETERIZATION WITH GRAFTS AND POSSIBLE PCI  Follow-Up:  INSTRUCTIONS WILL BE GIVEN AT [place]

## 2014-12-06 ENCOUNTER — Ambulatory Visit (INDEPENDENT_AMBULATORY_CARE_PROVIDER_SITE_OTHER): Payer: Medicare Other | Admitting: *Deleted

## 2014-12-06 DIAGNOSIS — I25709 Atherosclerosis of coronary artery bypass graft(s), unspecified, with unspecified angina pectoris: Secondary | ICD-10-CM | POA: Diagnosis not present

## 2014-12-06 DIAGNOSIS — I209 Angina pectoris, unspecified: Secondary | ICD-10-CM | POA: Diagnosis not present

## 2014-12-06 DIAGNOSIS — I5040 Unspecified combined systolic (congestive) and diastolic (congestive) heart failure: Secondary | ICD-10-CM | POA: Diagnosis not present

## 2014-12-06 DIAGNOSIS — I25810 Atherosclerosis of coronary artery bypass graft(s) without angina pectoris: Secondary | ICD-10-CM | POA: Diagnosis not present

## 2014-12-06 NOTE — Progress Notes (Signed)
Patient here and EKG completed. Will discuss with dr croitoru when he gets to the office today

## 2014-12-07 ENCOUNTER — Ambulatory Visit (HOSPITAL_COMMUNITY): Payer: Medicare Other

## 2014-12-07 LAB — COMPREHENSIVE METABOLIC PANEL
ALT: 20 U/L (ref 6–29)
AST: 27 U/L (ref 10–35)
Albumin: 3.7 g/dL (ref 3.6–5.1)
Alkaline Phosphatase: 149 U/L — ABNORMAL HIGH (ref 33–130)
BUN: 19 mg/dL (ref 7–25)
CHLORIDE: 104 mmol/L (ref 98–110)
CO2: 30 mmol/L (ref 20–31)
Calcium: 9.5 mg/dL (ref 8.6–10.4)
Creat: 1.49 mg/dL — ABNORMAL HIGH (ref 0.50–0.99)
Glucose, Bld: 132 mg/dL — ABNORMAL HIGH (ref 65–99)
Potassium: 3.9 mmol/L (ref 3.5–5.3)
Sodium: 142 mmol/L (ref 135–146)
TOTAL PROTEIN: 6.7 g/dL (ref 6.1–8.1)
Total Bilirubin: 0.6 mg/dL (ref 0.2–1.2)

## 2014-12-07 LAB — CBC
HCT: 37.4 % (ref 36.0–46.0)
Hemoglobin: 12.4 g/dL (ref 12.0–15.0)
MCH: 30.8 pg (ref 26.0–34.0)
MCHC: 33.2 g/dL (ref 30.0–36.0)
MCV: 92.8 fL (ref 78.0–100.0)
MPV: 10 fL (ref 8.6–12.4)
PLATELETS: 204 10*3/uL (ref 150–400)
RBC: 4.03 MIL/uL (ref 3.87–5.11)
RDW: 15.3 % (ref 11.5–15.5)
WBC: 5.3 10*3/uL (ref 4.0–10.5)

## 2014-12-07 LAB — PROTIME-INR
INR: 1.02 (ref <1.50)
PROTHROMBIN TIME: 13.4 s (ref 11.6–15.2)

## 2014-12-07 LAB — APTT: APTT: 35 s (ref 24–37)

## 2014-12-09 ENCOUNTER — Other Ambulatory Visit: Payer: Self-pay | Admitting: *Deleted

## 2014-12-09 DIAGNOSIS — R600 Localized edema: Secondary | ICD-10-CM

## 2014-12-09 MED ORDER — FUROSEMIDE 20 MG PO TABS: 20.0000 mg | ORAL_TABLET | Freq: Every day | ORAL | Status: DC

## 2014-12-12 ENCOUNTER — Encounter (HOSPITAL_COMMUNITY): Payer: Self-pay | Admitting: Cardiology

## 2014-12-12 ENCOUNTER — Ambulatory Visit (HOSPITAL_COMMUNITY)
Admission: RE | Admit: 2014-12-12 | Discharge: 2014-12-15 | Disposition: A | Discharge status: Home / Self Care | Payer: Medicare Other | Source: Ambulatory Visit | Attending: Cardiology | Admitting: Cardiology

## 2014-12-12 ENCOUNTER — Encounter (HOSPITAL_COMMUNITY)
Admission: RE | Disposition: A | Discharge status: Home / Self Care | Payer: Medicare Other | Source: Ambulatory Visit | Attending: Cardiology

## 2014-12-12 DIAGNOSIS — I130 Hypertensive heart and chronic kidney disease with heart failure and stage 1 through stage 4 chronic kidney disease, or unspecified chronic kidney disease: Secondary | ICD-10-CM | POA: Insufficient documentation

## 2014-12-12 DIAGNOSIS — N183 Chronic kidney disease, stage 3 (moderate): Secondary | ICD-10-CM | POA: Insufficient documentation

## 2014-12-12 DIAGNOSIS — N185 Chronic kidney disease, stage 5: Secondary | ICD-10-CM | POA: Diagnosis present

## 2014-12-12 DIAGNOSIS — I2584 Coronary atherosclerosis due to calcified coronary lesion: Secondary | ICD-10-CM | POA: Diagnosis not present

## 2014-12-12 DIAGNOSIS — I5043 Acute on chronic combined systolic (congestive) and diastolic (congestive) heart failure: Secondary | ICD-10-CM | POA: Diagnosis not present

## 2014-12-12 DIAGNOSIS — I25119 Atherosclerotic heart disease of native coronary artery with unspecified angina pectoris: Secondary | ICD-10-CM | POA: Diagnosis not present

## 2014-12-12 DIAGNOSIS — I252 Old myocardial infarction: Secondary | ICD-10-CM | POA: Insufficient documentation

## 2014-12-12 DIAGNOSIS — Z794 Long term (current) use of insulin: Secondary | ICD-10-CM | POA: Diagnosis not present

## 2014-12-12 DIAGNOSIS — Z7902 Long term (current) use of antithrombotics/antiplatelets: Secondary | ICD-10-CM | POA: Diagnosis not present

## 2014-12-12 DIAGNOSIS — E785 Hyperlipidemia, unspecified: Secondary | ICD-10-CM | POA: Insufficient documentation

## 2014-12-12 DIAGNOSIS — J302 Other seasonal allergic rhinitis: Secondary | ICD-10-CM

## 2014-12-12 DIAGNOSIS — R9439 Abnormal result of other cardiovascular function study: Secondary | ICD-10-CM

## 2014-12-12 DIAGNOSIS — Z7982 Long term (current) use of aspirin: Secondary | ICD-10-CM | POA: Diagnosis not present

## 2014-12-12 DIAGNOSIS — Z955 Presence of coronary angioplasty implant and graft: Secondary | ICD-10-CM | POA: Insufficient documentation

## 2014-12-12 DIAGNOSIS — I2510 Atherosclerotic heart disease of native coronary artery without angina pectoris: Secondary | ICD-10-CM

## 2014-12-12 DIAGNOSIS — I2582 Chronic total occlusion of coronary artery: Secondary | ICD-10-CM | POA: Diagnosis not present

## 2014-12-12 DIAGNOSIS — Z23 Encounter for immunization: Secondary | ICD-10-CM | POA: Diagnosis not present

## 2014-12-12 DIAGNOSIS — E1122 Type 2 diabetes mellitus with diabetic chronic kidney disease: Secondary | ICD-10-CM | POA: Diagnosis present

## 2014-12-12 DIAGNOSIS — E119 Type 2 diabetes mellitus without complications: Secondary | ICD-10-CM | POA: Insufficient documentation

## 2014-12-12 DIAGNOSIS — E1121 Type 2 diabetes mellitus with diabetic nephropathy: Secondary | ICD-10-CM | POA: Insufficient documentation

## 2014-12-12 DIAGNOSIS — Z951 Presence of aortocoronary bypass graft: Secondary | ICD-10-CM | POA: Insufficient documentation

## 2014-12-12 DIAGNOSIS — I25719 Atherosclerosis of autologous vein coronary artery bypass graft(s) with unspecified angina pectoris: Secondary | ICD-10-CM | POA: Insufficient documentation

## 2014-12-12 DIAGNOSIS — I739 Peripheral vascular disease, unspecified: Secondary | ICD-10-CM | POA: Diagnosis present

## 2014-12-12 DIAGNOSIS — N179 Acute kidney failure, unspecified: Secondary | ICD-10-CM | POA: Insufficient documentation

## 2014-12-12 DIAGNOSIS — I209 Angina pectoris, unspecified: Secondary | ICD-10-CM | POA: Insufficient documentation

## 2014-12-12 DIAGNOSIS — N186 End stage renal disease: Secondary | ICD-10-CM

## 2014-12-12 DIAGNOSIS — I5041 Acute combined systolic (congestive) and diastolic (congestive) heart failure: Secondary | ICD-10-CM | POA: Diagnosis present

## 2014-12-12 DIAGNOSIS — Z87891 Personal history of nicotine dependence: Secondary | ICD-10-CM | POA: Insufficient documentation

## 2014-12-12 DIAGNOSIS — E1140 Type 2 diabetes mellitus with diabetic neuropathy, unspecified: Secondary | ICD-10-CM

## 2014-12-12 DIAGNOSIS — D649 Anemia, unspecified: Secondary | ICD-10-CM | POA: Insufficient documentation

## 2014-12-12 DIAGNOSIS — I255 Ischemic cardiomyopathy: Secondary | ICD-10-CM | POA: Insufficient documentation

## 2014-12-12 DIAGNOSIS — I10 Essential (primary) hypertension: Secondary | ICD-10-CM | POA: Insufficient documentation

## 2014-12-12 DIAGNOSIS — Z9861 Coronary angioplasty status: Secondary | ICD-10-CM

## 2014-12-12 HISTORY — PX: CARDIAC CATHETERIZATION: SHX172

## 2014-12-12 LAB — POCT ACTIVATED CLOTTING TIME: ACTIVATED CLOTTING TIME: 577 s

## 2014-12-12 LAB — GLUCOSE, CAPILLARY
GLUCOSE-CAPILLARY: 75 mg/dL (ref 65–99)
GLUCOSE-CAPILLARY: 92 mg/dL (ref 65–99)
GLUCOSE-CAPILLARY: 86 mg/dL (ref 65–99)
GLUCOSE-CAPILLARY: 72 mg/dL (ref 65–99)
Glucose-Capillary: 165 mg/dL — ABNORMAL HIGH (ref 65–99)

## 2014-12-12 SURGERY — LEFT HEART CATH AND CORS/GRAFTS ANGIOGRAPHY
Anesthesia: LOCAL

## 2014-12-12 MED ORDER — ALBUTEROL SULFATE (2.5 MG/3ML) 0.083% IN NEBU: 2.5000 mg | INHALATION_SOLUTION | Freq: Four times a day (QID) | RESPIRATORY_TRACT | Status: DC | PRN

## 2014-12-12 MED ORDER — ISOSORBIDE MONONITRATE ER 60 MG PO TB24
60.0000 mg | ORAL_TABLET | Freq: Every day | ORAL | Status: DC
  Administered 2014-12-12 – 2014-12-13 (×2): 60 mg via ORAL
  Filled 2014-12-12 (×2): qty 1

## 2014-12-12 MED ORDER — FENTANYL CITRATE (PF) 100 MCG/2ML IJ SOLN
INTRAMUSCULAR | Status: AC
  Filled 2014-12-12: qty 4

## 2014-12-12 MED ORDER — ISOSORBIDE MONONITRATE ER 30 MG PO TB24
90.0000 mg | ORAL_TABLET | Freq: Every day | ORAL | Status: DC
Start: 2014-12-13 — End: 2014-12-12

## 2014-12-12 MED ORDER — ALBUTEROL SULFATE HFA 108 (90 BASE) MCG/ACT IN AERS: 2.0000 | INHALATION_SPRAY | Freq: Four times a day (QID) | RESPIRATORY_TRACT | Status: DC | PRN

## 2014-12-12 MED ORDER — LIVING WELL WITH DIABETES BOOK
Freq: Once | Status: AC
  Administered 2014-12-12: 21:00:00
  Filled 2014-12-12: qty 1

## 2014-12-12 MED ORDER — CLONIDINE HCL 0.2 MG PO TABS
0.2000 mg | ORAL_TABLET | Freq: Two times a day (BID) | ORAL | Status: DC
  Administered 2014-12-12 – 2014-12-13 (×3): 0.2 mg via ORAL
  Filled 2014-12-12 (×5): qty 1

## 2014-12-12 MED ORDER — SODIUM CHLORIDE 0.9 % IJ SOLN: 3.0000 mL | INTRAMUSCULAR | Status: DC | PRN

## 2014-12-12 MED ORDER — SODIUM CHLORIDE 0.9 % WEIGHT BASED INFUSION: 1.0000 mL/kg/h | INTRAVENOUS | Status: AC

## 2014-12-12 MED ORDER — TRAZODONE HCL 50 MG PO TABS
25.0000 mg | ORAL_TABLET | Freq: Every day | ORAL | Status: DC
  Administered 2014-12-12 – 2014-12-14 (×3): 25 mg via ORAL
  Filled 2014-12-12 (×3): qty 1

## 2014-12-12 MED ORDER — CLONIDINE HCL 0.2 MG PO TABS
0.2000 mg | ORAL_TABLET | Freq: Two times a day (BID) | ORAL | Status: DC
Start: 2014-12-12 — End: 2014-12-12
  Filled 2014-12-12: qty 1

## 2014-12-12 MED ORDER — ISOSORBIDE MONONITRATE ER 30 MG PO TB24
30.0000 mg | ORAL_TABLET | Freq: Every morning | ORAL | Status: DC
  Administered 2014-12-13: 10:00:00 30 mg via ORAL
  Filled 2014-12-12: qty 1

## 2014-12-12 MED ORDER — IOHEXOL 350 MG/ML SOLN
INTRAVENOUS | Status: DC | PRN
  Administered 2014-12-12: 140 mL via INTRA_ARTERIAL

## 2014-12-12 MED ORDER — INSULIN GLARGINE 100 UNIT/ML ~~LOC~~ SOLN
50.0000 [IU] | Freq: Every day | SUBCUTANEOUS | Status: DC
  Administered 2014-12-13 – 2014-12-15 (×3): 50 [IU] via SUBCUTANEOUS
  Filled 2014-12-12 (×3): qty 0.5

## 2014-12-12 MED ORDER — ASPIRIN 81 MG PO CHEW
CHEWABLE_TABLET | ORAL | Status: AC
  Administered 2014-12-12: 81 mg
  Filled 2014-12-12: qty 1

## 2014-12-12 MED ORDER — ANGIOPLASTY BOOK
Freq: Once | Status: AC
  Administered 2014-12-12: 21:00:00
  Filled 2014-12-12: qty 1

## 2014-12-12 MED ORDER — SODIUM CHLORIDE 0.9 % IV SOLN: 250.0000 mL | INTRAVENOUS | Status: DC | PRN

## 2014-12-12 MED ORDER — ASPIRIN 81 MG PO CHEW
81.0000 mg | CHEWABLE_TABLET | Freq: Every day | ORAL | Status: DC
  Administered 2014-12-13 – 2014-12-15 (×3): 81 mg via ORAL
  Filled 2014-12-12 (×3): qty 1

## 2014-12-12 MED ORDER — VERAPAMIL HCL 2.5 MG/ML IV SOLN
INTRAVENOUS | Status: AC
  Filled 2014-12-12: qty 2

## 2014-12-12 MED ORDER — MIDAZOLAM HCL 2 MG/2ML IJ SOLN
INTRAMUSCULAR | Status: DC | PRN
  Administered 2014-12-12: 1 mg via INTRAVENOUS
  Administered 2014-12-12: 2 mg via INTRAVENOUS

## 2014-12-12 MED ORDER — CAPTOPRIL 6.25 MG HALF TABLET
6.2500 mg | ORAL_TABLET | Freq: Three times a day (TID) | ORAL | Status: DC
  Administered 2014-12-12: 23:00:00 6.25 mg via ORAL
  Filled 2014-12-12: qty 1

## 2014-12-12 MED ORDER — ONDANSETRON HCL 4 MG/2ML IJ SOLN: 4.0000 mg | Freq: Four times a day (QID) | INTRAMUSCULAR | Status: DC | PRN

## 2014-12-12 MED ORDER — METOPROLOL SUCCINATE ER 50 MG PO TB24
150.0000 mg | ORAL_TABLET | Freq: Every day | ORAL | Status: DC
  Administered 2014-12-12 – 2014-12-15 (×4): 150 mg via ORAL
  Filled 2014-12-12 (×4): qty 3

## 2014-12-12 MED ORDER — NITROGLYCERIN 0.4 MG SL SUBL: 0.4000 mg | SUBLINGUAL_TABLET | SUBLINGUAL | Status: DC | PRN

## 2014-12-12 MED ORDER — VERAPAMIL HCL 2.5 MG/ML IV SOLN
INTRAVENOUS | Status: DC | PRN
  Administered 2014-12-12: 13:00:00 via INTRA_ARTERIAL

## 2014-12-12 MED ORDER — LORATADINE 10 MG PO TABS
10.0000 mg | ORAL_TABLET | Freq: Every day | ORAL | Status: DC
  Administered 2014-12-12 – 2014-12-15 (×4): 10 mg via ORAL
  Filled 2014-12-12 (×4): qty 1

## 2014-12-12 MED ORDER — GABAPENTIN 300 MG PO CAPS
400.0000 mg | ORAL_CAPSULE | Freq: Three times a day (TID) | ORAL | Status: DC
  Administered 2014-12-12 – 2014-12-15 (×10): 400 mg via ORAL
  Filled 2014-12-12 (×20): qty 1

## 2014-12-12 MED ORDER — INSULIN GLARGINE 300 UNIT/ML ~~LOC~~ SOPN: 50.0000 [IU] | PEN_INJECTOR | Freq: Every morning | SUBCUTANEOUS | Status: DC

## 2014-12-12 MED ORDER — LIDOCAINE HCL (PF) 1 % IJ SOLN
INTRAMUSCULAR | Status: AC
  Filled 2014-12-12: qty 30

## 2014-12-12 MED ORDER — NITROGLYCERIN 1 MG/10 ML FOR IR/CATH LAB
INTRA_ARTERIAL | Status: AC
  Filled 2014-12-12: qty 10

## 2014-12-12 MED ORDER — CLOPIDOGREL BISULFATE 75 MG PO TABS
75.0000 mg | ORAL_TABLET | Freq: Every day | ORAL | Status: DC
  Administered 2014-12-12 – 2014-12-14 (×3): 75 mg via ORAL
  Filled 2014-12-12 (×2): qty 1

## 2014-12-12 MED ORDER — BIVALIRUDIN 250 MG IV SOLR
INTRAVENOUS | Status: AC
  Filled 2014-12-12: qty 250

## 2014-12-12 MED ORDER — SODIUM CHLORIDE 0.9 % IJ SOLN
3.0000 mL | Freq: Two times a day (BID) | INTRAMUSCULAR | Status: DC
  Administered 2014-12-12: 16:00:00 10 mL via INTRAVENOUS
  Administered 2014-12-13 – 2014-12-14 (×2): 3 mL via INTRAVENOUS

## 2014-12-12 MED ORDER — ATORVASTATIN CALCIUM 40 MG PO TABS
40.0000 mg | ORAL_TABLET | Freq: Every day | ORAL | Status: DC
  Administered 2014-12-13 – 2014-12-15 (×3): 40 mg via ORAL
  Filled 2014-12-12 (×3): qty 1

## 2014-12-12 MED ORDER — ALLOPURINOL 300 MG PO TABS
300.0000 mg | ORAL_TABLET | Freq: Every day | ORAL | Status: DC
  Administered 2014-12-13 – 2014-12-15 (×3): 300 mg via ORAL
  Filled 2014-12-12 (×3): qty 1

## 2014-12-12 MED ORDER — ACETAMINOPHEN 325 MG PO TABS
650.0000 mg | ORAL_TABLET | ORAL | Status: DC | PRN
  Administered 2014-12-12 – 2014-12-15 (×4): 650 mg via ORAL
  Filled 2014-12-12 (×4): qty 2

## 2014-12-12 MED ORDER — INFLUENZA VAC SPLIT QUAD 0.5 ML IM SUSY
0.5000 mL | PREFILLED_SYRINGE | INTRAMUSCULAR | Status: AC
  Administered 2014-12-13: 0.5 mL via INTRAMUSCULAR
  Filled 2014-12-12: qty 0.5

## 2014-12-12 MED ORDER — BIVALIRUDIN BOLUS VIA INFUSION - CUPID
INTRAVENOUS | Status: DC | PRN
  Administered 2014-12-12: 77.925 mg via INTRAVENOUS

## 2014-12-12 MED ORDER — HEPARIN SODIUM (PORCINE) 1000 UNIT/ML IJ SOLN
INTRAMUSCULAR | Status: DC | PRN
  Administered 2014-12-12: 5000 [IU] via INTRAVENOUS

## 2014-12-12 MED ORDER — FENTANYL CITRATE (PF) 100 MCG/2ML IJ SOLN
INTRAMUSCULAR | Status: DC | PRN
  Administered 2014-12-12 (×2): 25 ug via INTRAVENOUS

## 2014-12-12 MED ORDER — SODIUM CHLORIDE 0.9 % IV SOLN
INTRAVENOUS | Status: DC
Start: 2014-12-13 — End: 2014-12-12
  Administered 2014-12-12: 11:00:00 via INTRAVENOUS

## 2014-12-12 MED ORDER — HEPARIN (PORCINE) IN NACL 2-0.9 UNIT/ML-% IJ SOLN
INTRAMUSCULAR | Status: AC
Start: 2014-12-12 — End: 2014-12-12
  Filled 2014-12-12: qty 1500

## 2014-12-12 MED ORDER — EZETIMIBE 10 MG PO TABS
10.0000 mg | ORAL_TABLET | Freq: Every day | ORAL | Status: DC
  Administered 2014-12-13 – 2014-12-15 (×3): 10 mg via ORAL
  Filled 2014-12-12 (×3): qty 1

## 2014-12-12 MED ORDER — SODIUM CHLORIDE 0.9 % IV SOLN
250.0000 mg | INTRAVENOUS | Status: DC | PRN
  Administered 2014-12-12: 1.75 mg/kg/h via INTRAVENOUS

## 2014-12-12 MED ORDER — HEPARIN (PORCINE) IN NACL 2-0.9 UNIT/ML-% IJ SOLN
INTRAMUSCULAR | Status: DC | PRN
  Administered 2014-12-12: 15:00:00

## 2014-12-12 MED ORDER — MIDAZOLAM HCL 2 MG/2ML IJ SOLN
INTRAMUSCULAR | Status: AC
  Filled 2014-12-12: qty 4

## 2014-12-12 MED ORDER — FLUTICASONE PROPIONATE 50 MCG/ACT NA SUSP
2.0000 | Freq: Every day | NASAL | Status: DC | PRN
  Filled 2014-12-12: qty 16

## 2014-12-12 MED ORDER — LEVOTHYROXINE SODIUM 50 MCG PO TABS
25.0000 ug | ORAL_TABLET | Freq: Every day | ORAL | Status: DC
  Administered 2014-12-13 – 2014-12-15 (×3): 25 ug via ORAL
  Filled 2014-12-12 (×3): qty 1

## 2014-12-12 MED ORDER — FUROSEMIDE 40 MG PO TABS
40.0000 mg | ORAL_TABLET | Freq: Every day | ORAL | Status: DC
  Administered 2014-12-13 – 2014-12-14 (×2): 40 mg via ORAL
  Filled 2014-12-12 (×2): qty 1

## 2014-12-12 MED ORDER — CLOPIDOGREL BISULFATE 75 MG PO TABS
75.0000 mg | ORAL_TABLET | Freq: Every day | ORAL | Status: DC
Start: 2014-12-13 — End: 2014-12-12
  Filled 2014-12-12: qty 1

## 2014-12-12 MED ORDER — HEPARIN SODIUM (PORCINE) 1000 UNIT/ML IJ SOLN
INTRAMUSCULAR | Status: AC
  Filled 2014-12-12: qty 1

## 2014-12-12 MED ORDER — INSULIN ASPART 100 UNIT/ML ~~LOC~~ SOLN
0.0000 [IU] | Freq: Three times a day (TID) | SUBCUTANEOUS | Status: DC
  Administered 2014-12-13 (×2): 4 [IU] via SUBCUTANEOUS
  Administered 2014-12-13 – 2014-12-14 (×4): 3 [IU] via SUBCUTANEOUS
  Administered 2014-12-15: 4 [IU] via SUBCUTANEOUS
  Administered 2014-12-15: 17:00:00 3 [IU] via SUBCUTANEOUS

## 2014-12-12 SURGICAL SUPPLY — 25 items
BALLN ANGIOSCULPT RX 2.5X10 (BALLOONS) ×2
BALLN EMERGE MR 2.5X15 (BALLOONS) ×2
BALLN ~~LOC~~ EMERGE MR 2.5X12 (BALLOONS) ×2
BALLN ~~LOC~~ EMERGE MR 3.0X12 (BALLOONS) ×2
BALLOON ANGIOSCULPT RX 2.5X10 (BALLOONS) ×1 IMPLANT
BALLOON EMERGE MR 2.5X15 (BALLOONS) ×1 IMPLANT
BALLOON ~~LOC~~ EMERGE MR 2.5X12 (BALLOONS) ×1 IMPLANT
BALLOON ~~LOC~~ EMERGE MR 3.0X12 (BALLOONS) ×1 IMPLANT
CATH INFINITI 5 FR IM (CATHETERS) ×2 IMPLANT
CATH INFINITI 5 FR JL3.5 (CATHETERS) ×2 IMPLANT
CATH INFINITI 5 FR LCB (CATHETERS) ×2 IMPLANT
CATH INFINITI 5 FR RCB (CATHETERS) ×2 IMPLANT
CATH INFINITI 5FR ANG PIGTAIL (CATHETERS) ×2 IMPLANT
CATH INFINITI JR4 5F (CATHETERS) ×2 IMPLANT
DEVICE RAD COMP TR BAND LRG (VASCULAR PRODUCTS) ×4 IMPLANT
GLIDESHEATH SLEND SS 6F .021 (SHEATH) ×2 IMPLANT
GUIDE CATH RUNWAY 6FR CLS3.5 (CATHETERS) ×2 IMPLANT
KIT ENCORE 26 ADVANTAGE (KITS) ×2 IMPLANT
KIT HEART LEFT (KITS) ×2 IMPLANT
PACK CARDIAC CATHETERIZATION (CUSTOM PROCEDURE TRAY) ×2 IMPLANT
STENT SYNERGY DES 2.75X20 (Permanent Stent) ×2 IMPLANT
TRANSDUCER W/STOPCOCK (MISCELLANEOUS) ×2 IMPLANT
TUBING CIL FLEX 10 FLL-RA (TUBING) ×2 IMPLANT
WIRE ASAHI PROWATER 180CM (WIRE) ×2 IMPLANT
WIRE SAFE-T 1.5MM-J .035X260CM (WIRE) ×2 IMPLANT

## 2014-12-12 NOTE — Progress Notes (Signed)
TR BAND REMOVAL  LOCATION:    left radial  DEFLATED PER PROTOCOL:    Yes.    TIME BAND OFF / DRESSING APPLIED:    1845    SITE UPON ARRIVAL:    Level 0  SITE AFTER BAND REMOVAL:    Level 0  REVERSE ALLEN'S TEST:     positive  CIRCULATION SENSATION AND MOVEMENT:    Within Normal Limits   Yes.    COMMENTS:   Post radial cath instructions given and reviewed w/ pt, voices understanding.

## 2014-12-12 NOTE — Progress Notes (Signed)
BP elevated 198/81, Tele SR 60.  pt given metoprolol as she hasn't had yet today.  Will continue monitor.

## 2014-12-12 NOTE — Progress Notes (Signed)
Discussed meds with Brooke Stafford and got them restarted as she takes at home, gave Imdur.  BP down to 179/90.

## 2014-12-12 NOTE — Interval H&P Note (Signed)
History and Physical Interval Note:  12/12/2014 1:00 PM  Brooke Stafford  has presented today for surgery, with the diagnosis of CAD  The various methods of treatment have been discussed with the patient and family. After consideration of risks, benefits and other options for treatment, the patient has consented to  Procedure(s): Left Heart Cath and Cors/Grafts Angiography (N/A) as a surgical intervention .  The patient's history has been reviewed, patient examined, no change in status, stable for surgery.  I have reviewed the patient's chart and labs.  Questions were answered to the patient's satisfaction.   Cath Lab Visit (complete for each Cath Lab visit)  Clinical Evaluation Leading to the Procedure:   ACS: No.  Non-ACS:    Anginal Classification: CCS II  Anti-ischemic medical therapy: Maximal Therapy (2 or more classes of medications)  Non-Invasive Test Results: No non-invasive testing performed  Prior CABG: Previous CABG        Luana Shu, [facility] 12/12/2014 1:01 PM

## 2014-12-12 NOTE — Progress Notes (Signed)
BP still 194/109 despite metoprolol 150 mg at 1642.  Pt denies complaints.  SR 60's.  No prn BP meds ordered at this time.  Melina Copa PA paged.

## 2014-12-12 NOTE — H&P (View-Only) (Signed)
Patient ID: Brooke Stafford, female   DOB: 1956-01-04, 59 y.o.   MRN: [id number]      Cardiology Office Note   Date:  12/02/2014   ID:  Brooke Stafford Oct 24, 1955, MRN [id number]  PCP:  Angelica Chessman, MD  Cardiologist:   Sanda Klein, MD   Chief Complaint  Patient presents with  . OFFICE VISIT ABNORMAL ECHO    Patient had a head ache all last night. She has also felt light headed, dizzy, SOB, and her stomach is swollen.      History of Present Illness: Brooke Stafford is a 58 y.o. female who presents for  Follow-up after an echocardiogram showed reduction left ventricular systolic function. She saw her primary care physician about 3 weeks ago with complaints of worsening edema,  abdominal fullness and shortness of breath. She has not had typical angina pectoris. After an increased dose of diuretic she is feeling better.  Dyspnea has improved, lower extremity edema has resolved, some degree of abdominal fullness persists.  Her echo shows LV EF has dropped from normal down to 35-40 percent.  No regional wall motion abnormalities were described, but Doppler velocity suggested elevated filling pressures.  Roughly one year ago both echocardiogram and nuclear scintigraphy showed normal left ventricular systolic function.  The nuclear study did show mild inferior ischemia ( a previous nuclear study in 2014 had shown a small inferior scar, no ischemia).  Dr. Martinique, June 2015: coronary angiography showed severe disease in the native coronary arteries, patent LIMA to LAD, patent SVG to PDA , severely stenosed SVG to diagonal which was treated with a 3.5 x 23 mm Xience Alpine drug-eluting stent , chronic occlusion of the SVG to the intermediate branch.  There was a stenosis in the ramus intermedius artery that was felt to be amenable to PCI , but left for medical therapy since it was not the culprit for acute coronary syndrome and also to avoid the risk of contrast-induced  nephropathy.  She has a past medical history significant for severe type 2 diabetes mellitus requiring insulin therapy, systemic hypertension, hyperlipidemia, obesity, PVD S/P LSFA PTA initally in 2012 and again in 2014, coronary artery disease, S/P CABG x 6 in 2002. In July 2011 when she presented with sepsis and medication non compliance and had a NSTEMI from demand ischemia: Cath  showed a patent LIMA to LAD, patent SVG to diagonal, and patent sequential SVG to branches of the right coronary artery, chronic occlusion of SVG-ramus intermedius. She also has a mild ischemic cardiomyopathy with a left ventricular ejection fraction of 45% to 50% by echo 5/11.  In May 2015 both echo and nuclear stress testing showed normal LV EF around 60%. June 2015 presented with crescendo angina and received a stent to the SVG to diagonal artery (3.5 x 23 mm Xience Alpine).  No change in other coronary/graft anatomy, persistent stenosis in the ramus intermedius artery left for medical therapy. Echo in July 2016 showed reduction in EF to 35-40 %, associated with heart failure symptoms.  Past Medical History  Diagnosis Date  . Asthma   . Anxiety   . GERD (gastroesophageal reflux disease)   . Coronary artery disease 2002    CABG x 6. Cath 5/11- med Rx  . Hypertension   . Peripheral vascular disease 12/12    LSFA PTA  . Anemia   . Hyperlipidemia   . Chronic renal insufficiency, stage II (mild)     followed  by [facility]  .  CAD (coronary artery disease) 2002; 2015    CABG x 6 2002, cath 2011- med Rx stent DES VG-Diag  . Hypothyroid     treated  . Obesity (BMI 35.0-39.9 without comorbidity)   . CHF (congestive heart failure)     "in 2002" (11/26/2012)  . Myocardial infarction 2000; 2002; 2011?  [name] Chronic bronchitis     "q year; in the winter" (08/04/2013)  . Type II diabetes mellitus   . Depression   . Pneumonia     "3 times I think" (08/04/2013)  . History of blood transfusion 2002    "when I had OHS"   . Migraines     "couple times/year" (08/04/2013)  . Headache     "~ q week" (08/04/2013)  . Arthritis     "stiff fingers and knees" (08/04/2013)  . Gout     "right big toe"  . Anginal pain   . CAD (coronary artery disease) of artery bypass graft; DES to VG-Diag 09/28/13 11/09/2013    Past Surgical History  Procedure Laterality Date  . Cholecystectomy  1982  . Cesarean section  1978; 1980  . Tubal ligation  1980  . Breast cyst excision Right 1970's  . Coronary artery bypass graft  11/20/2000    x6 LIMA to distal LAD, svg to first diag, svg to ramus intermediate branch and swquential SVG to cir marginal branch, SVG to posterior descending coronary and sequential SVG to first right posterolateral branch  . Cardiac catheterization  2002  . Coronary angioplasty with stent placement  2004; 2012    "I have 2 stents" (08/04/2013)  . Peripheral arterial stent graft Left     SFA/notes 04/07/2011 (11/30/2012)  . Nm myocar perf wall motion  08/27/2004    negative  . Appendectomy  1980  . Coronary angioplasty with stent placement  09/28/13    PTCA/ DES Xience stent to VG-Diag   . Abdominal aortagram N/A 04/05/2011    Procedure: ABDOMINAL AORTAGRAM;  Surgeon: Lorretta Harp, MD;  Location: [facility] CATH LAB;  Service: Cardiovascular;  Laterality: N/A;  . Renal angiogram N/A 04/05/2011    Procedure: RENAL ANGIOGRAM;  Surgeon: Lorretta Harp, MD;  Location: [facility] CATH LAB;  Service: Cardiovascular;  Laterality: N/A;  . Lower extremity angiogram  12/01/2012    Procedure: LOWER EXTREMITY ANGIOGRAM;  Surgeon: Lorretta Harp, MD;  Location: [facility] CATH LAB;  Service: Cardiovascular;;  . Percutaneous stent intervention Left 12/01/2012    Procedure: PERCUTANEOUS STENT INTERVENTION;  Surgeon: Lorretta Harp, MD;  Location: [facility] CATH LAB;  Service: Cardiovascular;  Laterality: Left;  Left SFA  . Left heart catheterization with coronary/graft angiogram N/A 09/28/2013    Procedure: LEFT HEART CATHETERIZATION WITH  Beatrix Fetters;  Surgeon: Peter M Martinique, MD;  Location: [facility] CATH LAB;  Service: Cardiovascular;  Laterality: N/A;     Current Outpatient Prescriptions  Medication Sig Dispense Refill  . acetaminophen-codeine (TYLENOL #3) 300-30 MG per tablet Take 1 tablet by mouth every 4 (four) hours as needed. 60 tablet 0  . albuterol (PROVENTIL HFA;VENTOLIN HFA) 108 (90 BASE) MCG/ACT inhaler Inhale 2 puffs into the lungs every 6 (six) hours as needed for wheezing or shortness of breath. 3 Inhaler 3  . allopurinol (ZYLOPRIM) 300 MG tablet Take 1 tablet (300 mg total) by mouth daily. 90 tablet 3  . aspirin 81 MG chewable tablet Chew 1 tablet (81 mg total) by mouth daily.    [name] atorvastatin (LIPITOR) 40 MG tablet Take 1 tablet (40 mg  total) by mouth daily. 90 tablet 3  . cloNIDine (CATAPRES) 0.2 MG tablet Take 1 tablet (0.2 mg total) by mouth 2 (two) times daily. 180 tablet 3  . clopidogrel (PLAVIX) 75 MG tablet Take 1 tablet (75 mg total) by mouth daily. 30 tablet 6  . cyclobenzaprine (FLEXERIL) 10 MG tablet Take 1 tablet (10 mg total) by mouth 3 (three) times daily as needed for muscle spasms. 60 tablet 3  . fluticasone (FLONASE) 50 MCG/ACT nasal spray Place 2 sprays into both nostrils daily as needed for allergies or rhinitis. 16 g 3  . furosemide (LASIX) 20 MG tablet Take 0.5 tablets (10 mg total) by mouth daily. 30 tablet 0  . gabapentin (NEURONTIN) 400 MG capsule Take 1 capsule (400 mg total) by mouth 3 (three) times daily. 270 capsule 3  . insulin aspart (NOVOLOG FLEXPEN) 100 UNIT/ML FlexPen Inject 25-45 Units into the skin 3 (three) times daily with meals. Inject 35 units subq at breakfast, inject 25 units subq at lunch and inject 40 units subq at supper. 30 mL 2  . Insulin Glargine (TOUJEO SOLOSTAR) 300 UNIT/ML SOPN Inject 50 Units into the skin daily. 6 pen 1  . Insulin Pen Needle 32G X 4 MM MISC Use to inject insulin daily as instructed. 150 each 11  . Insulin Syringes, Disposable, (B-D  INSULIN SYRINGE 1CC) U-100 1 ML MISC Use to inject insulin 4 times daily as instructed. 160 each 4  . isosorbide mononitrate (IMDUR) 60 MG 24 hr tablet Take 1.5 tablets (90 mg total) by mouth daily. 90 tablet 5  . levothyroxine (SYNTHROID, LEVOTHROID) 25 MCG tablet take 1 tablet by mouth every morning BEFORE BREAKFAST 30 tablet 2  . loratadine (CLARITIN) 10 MG tablet Take 1 tablet (10 mg total) by mouth daily. 30 tablet 11  . Menthol-Camphor (TIGER BALM ARTHRITIS RUB) 11-11 % CREA Apply 1 application topically 2 (two) times daily.    . metoprolol succinate (TOPROL-XL) 50 MG 24 hr tablet Take 3 tablets (150 mg total) by mouth daily. Take with or immediately following a meal. 90 tablet 5  . nitroGLYCERIN (NITROSTAT) 0.4 MG SL tablet Place 1 tablet (0.4 mg total) under the tongue every 5 (five) minutes as needed. For chest 30 tablet 6  . traZODone (DESYREL) 50 MG tablet Take 0.5 tablets (25 mg total) by mouth at bedtime. 45 tablet 3  . ZETIA 10 MG tablet take 1 tablet by mouth once daily 90 tablet 2   No current facility-administered medications for this visit.    Allergies:   Hydralazine; Penicillins cross reactors; and Adhesive    Social History:  The patient  reports that she quit smoking about 14 years ago. Her smoking use included Cigarettes. She has a 25 pack-year smoking history. She has never used smokeless tobacco. She reports that she drinks alcohol. She reports that she does not use illicit drugs.   Family History:  The patient's family history includes Diabetes in her mother and sister; Hyperlipidemia in her sister; Hypertension in her brother, father, mother, and sister; Stroke in her mother.    ROS:  Please see the history of present illness.    Otherwise, review of systems positive for  headaches, dizziness.   All other systems are reviewed and negative.    PHYSICAL EXAM: VS:  BP 158/80 mmHg  Pulse 70  Resp 16  Ht 5\' 10"  (1.778 m)  Wt 236 lb (107.049 kg)  BMI 33.86 kg/m2   LMP 10/13/2014 (Exact Date) , BMI Body  mass index is 33.86 kg/(m^2).  General: Alert, oriented x3, no distress Head: no evidence of trauma, PERRL, EOMI, no exophtalmos or lid lag, no myxedema, no xanthelasma; normal ears, nose and oropharynx Neck: normal jugular venous pulsations and no hepatojugular reflux; brisk carotid pulses without delay and no carotid bruits Chest: clear to auscultation, no signs of consolidation by percussion or palpation, normal fremitus, symmetrical and full respiratory excursions Cardiovascular: normal position and quality of the apical impulse, regular rhythm, normal first and second heart sounds, no murmurs, rubs or gallops Abdomen: no tenderness or distention, no masses by palpation, no abnormal pulsatility or arterial bruits, normal bowel sounds, no hepatosplenomegaly Extremities: no clubbing, cyanosis or edema; 2+ radial, ulnar and brachial pulses bilaterally; 2+ right femoral, posterior tibial and dorsalis pedis pulses; 2+ left femoral, posterior tibial and dorsalis pedis pulses; no subclavian or femoral bruits Neurological: grossly nonfocal Psych: euthymic mood, full affect  Recent Labs: 01/25/2014: Pro B Natriuretic peptide (BNP) 1369.0* 08/08/2014: BUN 9; Creatinine, Ser 1.36*; Potassium 3.8; Sodium 141 08/11/2014: Hemoglobin 13.5; Platelets 251    Lipid Panel    Component Value Date/Time   CHOL 180 08/04/2013 0430   TRIG 230* 08/04/2013 0430   HDL 35* 08/04/2013 0430   CHOLHDL 5.1 08/04/2013 0430   VLDL 46* 08/04/2013 0430   LDLCALC 99 08/04/2013 0430      Wt Readings from Last 3 Encounters:  12/02/14 236 lb (107.049 kg)  11/10/14 238 lb 12.8 oz (108.319 kg)  08/11/14 232 lb 12.8 oz (105.597 kg)     ASSESSMENT AND PLAN:  1.  Acute on chronic systolic and diastolic heart failure, with recent decrease in left ventricular systolic function I echo. Although no clear-cut regional abnormalities were described, the most likely etiology is new  ischemia. Although her HTN control is not perfect it is also not bad enough to justify reduction in LVEF. She is receiving beta blockers but is currently not on ACE inhibitor.  I last saw her she was taking amlodipine/benazepril but this has since been discontinued?.  It was also still on her medicine list when she last saw her PCP in July. We'll not restart ACE inhibitor is until after coronary angiography  2.  Coronary disease status post bypass surgery and previous DES to SVG to diagonal roughly one year ago. She is in the window for in-stent restenosis. He is also known to have a stenosis in the ramus intermedius artery , whose graft is closed. I'm not sure diagonal graft restenosis is enough to explain the drop in LVEF. I think she should undergo repeat angiography.  He is still taking aspirin and clopidogrel and is on a high-dose statin.  3.  Insulin requiring diabetes mellitus complicated by diabetic nephropathy/CKD stage 2.  If extensive revascularization procedures are necessary,  These may need to be performed as staged procedures to avoid contrast-induced nephrotoxicity. Also need to hold her diuretic before the procedure.  4.  Hyperlipidemia. Target LDL should be less than 70. Recheck labs.   Current medicines are reviewed at length with the patient today.  The patient does not have concerns regarding medicines.  The following changes have been made:  no change  Labs/ tests ordered today include:  Orders Placed This Encounter  Procedures  . APTT  . Protime-INR  . CBC  . Comprehensive metabolic panel  . LEFT HEART CATHETERIZATION WITH CORONARY ANGIOGRAM    Patient Instructions  Medication Instructions:   CONTINUE CURRENT MEDS  Labwork:  5-7 DAYS PRIOR TO THE  CARDIAC CATH AT [facility]  Testing/Procedures:  LEFT HEART CATHETERIZATION WITH GRAFTS AND POSSIBLE PCI  Follow-Up:  INSTRUCTIONS WILL BE GIVEN AT [place]         Signed, Sanda Klein, MD  12/02/2014 12:09 PM    Sanda Klein, MD, [facility] [phone] office [phone] pager

## 2014-12-12 NOTE — Progress Notes (Signed)
Pt received in to 6C07, placed on tele, IVF increased per order to 104 cc/hr.  Rt radial TR band in place, level 0.  Pt denies complaints except dry mouth and hungry.  Family in to visit.

## 2014-12-13 DIAGNOSIS — Z955 Presence of coronary angioplasty implant and graft: Secondary | ICD-10-CM | POA: Diagnosis not present

## 2014-12-13 DIAGNOSIS — I2584 Coronary atherosclerosis due to calcified coronary lesion: Secondary | ICD-10-CM | POA: Diagnosis not present

## 2014-12-13 DIAGNOSIS — R931 Abnormal findings on diagnostic imaging of heart and coronary circulation: Secondary | ICD-10-CM

## 2014-12-13 DIAGNOSIS — I10 Essential (primary) hypertension: Secondary | ICD-10-CM | POA: Diagnosis not present

## 2014-12-13 DIAGNOSIS — Z951 Presence of aortocoronary bypass graft: Secondary | ICD-10-CM | POA: Diagnosis not present

## 2014-12-13 DIAGNOSIS — I209 Angina pectoris, unspecified: Secondary | ICD-10-CM | POA: Diagnosis not present

## 2014-12-13 DIAGNOSIS — E1122 Type 2 diabetes mellitus with diabetic chronic kidney disease: Secondary | ICD-10-CM

## 2014-12-13 DIAGNOSIS — I25119 Atherosclerotic heart disease of native coronary artery with unspecified angina pectoris: Secondary | ICD-10-CM | POA: Diagnosis not present

## 2014-12-13 DIAGNOSIS — Z23 Encounter for immunization: Secondary | ICD-10-CM | POA: Diagnosis not present

## 2014-12-13 DIAGNOSIS — N189 Chronic kidney disease, unspecified: Secondary | ICD-10-CM

## 2014-12-13 DIAGNOSIS — I255 Ischemic cardiomyopathy: Secondary | ICD-10-CM | POA: Diagnosis not present

## 2014-12-13 LAB — CBC
HEMATOCRIT: 32.4 % — AB (ref 36.0–46.0)
Hemoglobin: 10.6 g/dL — ABNORMAL LOW (ref 12.0–15.0)
MCH: 30.3 pg (ref 26.0–34.0)
MCHC: 32.7 g/dL (ref 30.0–36.0)
MCV: 92.6 fL (ref 78.0–100.0)
Platelets: 165 10*3/uL (ref 150–400)
RBC: 3.5 MIL/uL — ABNORMAL LOW (ref 3.87–5.11)
RDW: 14.4 % (ref 11.5–15.5)
WBC: 4.1 10*3/uL (ref 4.0–10.5)

## 2014-12-13 LAB — BASIC METABOLIC PANEL
Anion gap: 8 (ref 5–15)
BUN: 18 mg/dL (ref 6–20)
CHLORIDE: 104 mmol/L (ref 101–111)
CO2: 25 mmol/L (ref 22–32)
Calcium: 8.9 mg/dL (ref 8.9–10.3)
Creatinine, Ser: 1.73 mg/dL — ABNORMAL HIGH (ref 0.44–1.00)
GFR calc Af Amer: 36 mL/min — ABNORMAL LOW (ref >60)
GFR calc non Af Amer: 31 mL/min — ABNORMAL LOW (ref >60)
GLUCOSE: 162 mg/dL — AB (ref 65–99)
POTASSIUM: 3.8 mmol/L (ref 3.5–5.1)
Sodium: 137 mmol/L (ref 135–145)

## 2014-12-13 LAB — GLUCOSE, CAPILLARY
GLUCOSE-CAPILLARY: 129 mg/dL — AB (ref 65–99)
GLUCOSE-CAPILLARY: 196 mg/dL — AB (ref 65–99)
GLUCOSE-CAPILLARY: 200 mg/dL — AB (ref 65–99)
Glucose-Capillary: 204 mg/dL — ABNORMAL HIGH (ref 65–99)

## 2014-12-13 MED ORDER — SODIUM CHLORIDE 0.9 % IV SOLN
INTRAVENOUS | Status: DC
  Administered 2014-12-13: 12:00:00 via INTRAVENOUS

## 2014-12-13 MED ORDER — CAPTOPRIL 25 MG PO TABS
25.0000 mg | ORAL_TABLET | Freq: Three times a day (TID) | ORAL | Status: DC
  Administered 2014-12-13 (×2): 25 mg via ORAL
  Filled 2014-12-13 (×4): qty 1

## 2014-12-13 MED ORDER — AMLODIPINE BESYLATE 5 MG PO TABS
5.0000 mg | ORAL_TABLET | Freq: Every day | ORAL | Status: DC
  Administered 2014-12-13: 5 mg via ORAL
  Filled 2014-12-13: qty 1

## 2014-12-13 MED FILL — Colistimethate Sodium For Inj 150 MG: INTRAMUSCULAR | Qty: 2 | Status: AC

## 2014-12-13 NOTE — Progress Notes (Signed)
Patient Name: Brooke Stafford Date of Encounter: 12/13/2014   SUBJECTIVE  Feels fine. BP still elevated to 170s. Complaining of mild HA. Per patient, she was "off balance" while walking with cardiac rehab. Final report to follow.   CURRENT MEDS . allopurinol  300 mg Oral Daily  . aspirin  81 mg Oral Daily  . atorvastatin  40 mg Oral Daily  . captopril  25 mg Oral TID  . cloNIDine  0.2 mg Oral BID  . clopidogrel  75 mg Oral QHS  . ezetimibe  10 mg Oral Daily  . furosemide  40 mg Oral Daily  . gabapentin  400 mg Oral TID  . Influenza vac split quadrivalent PF  0.5 mL Intramuscular Tomorrow-1000  . insulin aspart  0-20 Units Subcutaneous TID WC  . insulin glargine  50 Units Subcutaneous Daily  . isosorbide mononitrate  30 mg Oral q morning - 10a   And  . isosorbide mononitrate  60 mg Oral QHS  . levothyroxine  25 mcg Oral QAC breakfast  . loratadine  10 mg Oral Daily  . metoprolol succinate  150 mg Oral Daily  . sodium chloride  3 mL Intravenous Q12H  . traZODone  25 mg Oral QHS    OBJECTIVE  Filed Vitals:   12/13/14 0600 12/13/14 0636 12/13/14 0700 12/13/14 0800  BP: 203/93 199/85 186/79 179/88  Pulse:  58  59  Temp:    97.7 F (36.5 C)  TempSrc:    Oral  Resp:    13  Height:      Weight:      SpO2:  99%  99%    Intake/Output Summary (Last 24 hours) at 12/13/14 0907 Last data filed at 12/12/14 2200  Gross per 24 hour  Intake 823.65 ml  Output   1100 ml  Net -276.35 ml   Filed Weights   12/12/14 0816 12/13/14 0300  Weight: 229 lb (103.874 kg) 233 lb 1.6 oz (105.733 kg)    PHYSICAL EXAM  General: Pleasant, NAD. Neuro: Alert and oriented X 3. Moves all extremities spontaneously. Psych: Normal affect. HEENT:  Normal  Neck: Supple without bruits or JVD. Lungs:  Resp regular and unlabored, CTA. Heart: RRR no s3, s4, or murmurs. Abdomen: Soft, non-tender, non-distended, BS + x 4.  Extremities: No clubbing, cyanosis or edema. DP/PT/Radials 2+ and equal  bilaterally.Left radial cath site without hematoma or bruit.   Accessory Clinical Findings  CBC  Recent Labs  12/13/14 0245  WBC 4.1  HGB 10.6*  HCT 32.4*  MCV 92.6  PLT 123XX123   Basic Metabolic Panel  Recent Labs  12/13/14 0245  NA 137  K 3.8  CL 104  CO2 25  GLUCOSE 162*  BUN 18  CREATININE 1.73*  CALCIUM 8.9    TELE  NSR  Cath 12/13/14 Conclusion     Prox RCA to Mid RCA lesion, 80% stenosed.  Dist RCA lesion, 95% stenosed.  was injected is normal in caliber.  The graft exhibits minimal luminal irregularities.  Ost LAD to Prox LAD lesion, 100% stenosed.  SVG is normal in caliber, and is anatomically normal.  A drug-eluting stent was placed in past.  LIMA is normal in caliber, and is anatomically normal.  Mid Cx to Dist Cx lesion, 30% stenosed.  LM lesion, 40% stenosed.  Ramus lesion, 90% stenosed. There is a 0% residual stenosis post intervention. The lesion was previously treated with a stent (unknown type) greater than two years ago.  A drug-eluting  stent was placed.  1. Severe 3 vessel obstructive CAD 2. Patent LIMA to LAD 3. Patent SVG to first diagonal- it also fills second diagonal. Stent in SVG is patent. 4. Patent SVG to PDA 5. Occluded SVG to ramus intermediate. Chronic 6. Successful stenting of the proximal ramus intermediate with a DES.  7. Elevated LV EDP    ASSESSMENT AND PLAN Principal Problem:   Chest pain Active Problems:   S/P CABG (coronary artery bypass graft) 2002   HTN (hypertension), benign   Type 2 diabetes mellitus with chronic kidney disease   Angina pectoris   Abnormal nuclear stress test    Plan: cath showed patent LIMA to LAD, Patent SVG to first diagonal (fills second diagonal - stent in SVG patent), patent SVG to PDA, occluded SVG to ramus intermediate s./p successful PTCA and DES. Continue DAPT for once year. Increased lsix 40mg  daily. Plan for maximize medical therapy for CHF. - Continue ASA, lipitor 40  and Zetia 10mg , plavix, imdur 30mg  in AM, Imdur 60mg  in PM, Toprol XL 150mg  and Captopril 25mg  TID - Creatinine increase to 1.73. Follow closely. Will discuss with MD with management and plan.  Jarrett Soho PA-C Pager [phone]

## 2014-12-13 NOTE — Progress Notes (Signed)
CARDIAC REHAB PHASE I   PRE:  Rate/Rhythm: 59 SB  BP:  Supine: 179/88  Sitting:   Standing:    SaO2:   MODE:  Ambulation: 300 ft   POST:  Rate/Rhythm: 72 SR  BP:  Supine:   Sitting: 219/100,  Rest 194/84  Standing:    SaO2:  [id number] Pt walked 300 ft holding to side rail at times. C/o dizziness and being lightheaded. No CP. Left leg buckling at times which she stated is normal for her. Had cane but left it somewhere. Encouraged her to get cane as she stated she has fallen before. Gave pt CHF booklet and reviewed importance of daily weights, fluid and sodium restriction. Also discussed carb counting. Pt given diabetic and low sodium diets. BP very elevated before and after walk. Discussed watching sodium to help with this also. Stressed importance of plavix with stent. Pt had attended CRP 2 in 2002 and referring to [facility] now with her permission.    Graylon Good, RN BSN  12/13/2014 9:06 AM

## 2014-12-14 ENCOUNTER — Ambulatory Visit (HOSPITAL_COMMUNITY): Payer: Medicare Other

## 2014-12-14 DIAGNOSIS — Z23 Encounter for immunization: Secondary | ICD-10-CM | POA: Diagnosis not present

## 2014-12-14 DIAGNOSIS — I209 Angina pectoris, unspecified: Secondary | ICD-10-CM | POA: Diagnosis not present

## 2014-12-14 DIAGNOSIS — Z955 Presence of coronary angioplasty implant and graft: Secondary | ICD-10-CM | POA: Diagnosis not present

## 2014-12-14 DIAGNOSIS — Z951 Presence of aortocoronary bypass graft: Secondary | ICD-10-CM | POA: Diagnosis not present

## 2014-12-14 DIAGNOSIS — I25119 Atherosclerotic heart disease of native coronary artery with unspecified angina pectoris: Secondary | ICD-10-CM | POA: Diagnosis not present

## 2014-12-14 DIAGNOSIS — I10 Essential (primary) hypertension: Secondary | ICD-10-CM

## 2014-12-14 DIAGNOSIS — R931 Abnormal findings on diagnostic imaging of heart and coronary circulation: Secondary | ICD-10-CM | POA: Diagnosis not present

## 2014-12-14 DIAGNOSIS — I255 Ischemic cardiomyopathy: Secondary | ICD-10-CM | POA: Diagnosis not present

## 2014-12-14 DIAGNOSIS — I2584 Coronary atherosclerosis due to calcified coronary lesion: Secondary | ICD-10-CM | POA: Diagnosis not present

## 2014-12-14 LAB — TSH: TSH: 2.089 u[IU]/mL (ref 0.350–4.500)

## 2014-12-14 LAB — URINALYSIS, ROUTINE W REFLEX MICROSCOPIC
BILIRUBIN URINE: NEGATIVE
GLUCOSE, UA: NEGATIVE mg/dL
KETONES UR: NEGATIVE mg/dL
Leukocytes, UA: NEGATIVE
NITRITE: NEGATIVE
PH: 6 (ref 5.0–8.0)
Protein, ur: 100 mg/dL — AB
SPECIFIC GRAVITY, URINE: 1.007 (ref 1.005–1.030)
Urobilinogen, UA: 0.2 mg/dL (ref 0.0–1.0)

## 2014-12-14 LAB — URINE MICROSCOPIC-ADD ON

## 2014-12-14 LAB — BASIC METABOLIC PANEL
Anion gap: 6 (ref 5–15)
BUN: 19 mg/dL (ref 6–20)
CALCIUM: 8.9 mg/dL (ref 8.9–10.3)
CO2: 26 mmol/L (ref 22–32)
CREATININE: 1.68 mg/dL — AB (ref 0.44–1.00)
Chloride: 107 mmol/L (ref 101–111)
GFR calc Af Amer: 37 mL/min — ABNORMAL LOW (ref >60)
GFR, EST NON AFRICAN AMERICAN: 32 mL/min — AB (ref >60)
GLUCOSE: 174 mg/dL — AB (ref 65–99)
POTASSIUM: 3.8 mmol/L (ref 3.5–5.1)
SODIUM: 139 mmol/L (ref 135–145)

## 2014-12-14 LAB — GLUCOSE, CAPILLARY
GLUCOSE-CAPILLARY: 133 mg/dL — AB (ref 65–99)
GLUCOSE-CAPILLARY: 123 mg/dL — AB (ref 65–99)
GLUCOSE-CAPILLARY: 175 mg/dL — AB (ref 65–99)
Glucose-Capillary: 121 mg/dL — ABNORMAL HIGH (ref 65–99)

## 2014-12-14 MED ORDER — ISOSORBIDE MONONITRATE ER 60 MG PO TB24
60.0000 mg | ORAL_TABLET | Freq: Every morning | ORAL | Status: DC
  Administered 2014-12-14: 60 mg via ORAL
  Filled 2014-12-14: qty 1

## 2014-12-14 MED ORDER — AMLODIPINE BESYLATE 10 MG PO TABS
10.0000 mg | ORAL_TABLET | Freq: Every day | ORAL | Status: DC
  Administered 2014-12-14: 10 mg via ORAL
  Filled 2014-12-14: qty 1

## 2014-12-14 MED ORDER — CLONIDINE HCL 0.2 MG PO TABS
0.2000 mg | ORAL_TABLET | Freq: Two times a day (BID) | ORAL | Status: DC
  Administered 2014-12-14: 09:00:00 0.2 mg via ORAL
  Filled 2014-12-14 (×2): qty 1

## 2014-12-14 MED ORDER — BISACODYL 10 MG RE SUPP
10.0000 mg | Freq: Once | RECTAL | Status: DC
  Filled 2014-12-14: qty 1

## 2014-12-14 MED ORDER — CLONIDINE HCL 0.3 MG PO TABS
0.3000 mg | ORAL_TABLET | Freq: Two times a day (BID) | ORAL | Status: DC
  Filled 2014-12-14 (×2): qty 1

## 2014-12-14 MED ORDER — ISOSORBIDE MONONITRATE ER 60 MG PO TB24
60.0000 mg | ORAL_TABLET | Freq: Every day | ORAL | Status: DC
  Administered 2014-12-14: 22:00:00 60 mg via ORAL
  Filled 2014-12-14: qty 1

## 2014-12-14 MED ORDER — POLYETHYLENE GLYCOL 3350 17 G PO PACK
17.0000 g | PACK | Freq: Every day | ORAL | Status: DC
  Administered 2014-12-14 – 2014-12-15 (×2): 17 g via ORAL
  Filled 2014-12-14 (×2): qty 1

## 2014-12-14 MED ORDER — CLONIDINE HCL 0.2 MG PO TABS
0.2000 mg | ORAL_TABLET | Freq: Three times a day (TID) | ORAL | Status: DC
  Administered 2014-12-14 – 2014-12-15 (×3): 0.2 mg via ORAL
  Filled 2014-12-14 (×3): qty 1

## 2014-12-14 NOTE — Progress Notes (Signed)
Patient: Brooke Stafford / Admit Date: 12/12/2014 / Date of Encounter: 12/14/2014, 7:34 AM   Subjective: Developed SOB overnight requiring supplemental O2. This feels better this AM. No [name]. No CP.   Objective: Telemetry: NSR, rare PVCs Physical Exam: Blood pressure 196/91, pulse 65, temperature 98.4 F (36.9 C), temperature source Oral, resp. rate 20, height 5' 10.5" (1.791 m), weight 234 lb 12.6 oz (106.5 kg), last menstrual period 10/13/2014, SpO2 100 %. General: Well developed, well nourished AAF in no acute distress. Head: Normocephalic, atraumatic, sclera non-icteric, no xanthomas, nares are without discharge. Neck: JVP not elevated. Lungs: Diminished at bases bilaterally without wheezes, rales, or rhonchi. Breathing is unlabored. Heart: RRR S1 S2 without murmurs, rubs, or gallops.  Abdomen: Soft, non-tender, non-distended with normoactive bowel sounds. No rebound/guarding. Extremities: No clubbing or cyanosis. No edema. Distal pedal pulses are 2+ and equal bilaterally. Left radial cath site without hematoma, ecchymosis. Good radial pulse. Neuro: Alert and oriented X 3. Moves all extremities spontaneously. Psych:  Responds to questions appropriately with a normal affect.   Intake/Output Summary (Last 24 hours) at 12/14/14 0734 Last data filed at 12/14/14 0514  Gross per 24 hour  Intake 1906.25 ml  Output   3200 ml  Net -1293.75 ml    Inpatient Medications:  . allopurinol  300 mg Oral Daily  . amLODipine  5 mg Oral Daily  . aspirin  81 mg Oral Daily  . atorvastatin  40 mg Oral Daily  . cloNIDine  0.2 mg Oral BID  . clopidogrel  75 mg Oral QHS  . ezetimibe  10 mg Oral Daily  . furosemide  40 mg Oral Daily  . gabapentin  400 mg Oral TID  . insulin aspart  0-20 Units Subcutaneous TID WC  . insulin glargine  50 Units Subcutaneous Daily  . isosorbide mononitrate  30 mg Oral q morning - 10a   And  . isosorbide mononitrate  60 mg Oral QHS  . levothyroxine  25 mcg Oral QAC  breakfast  . loratadine  10 mg Oral Daily  . metoprolol succinate  150 mg Oral Daily  . sodium chloride  3 mL Intravenous Q12H  . traZODone  25 mg Oral QHS   Infusions:  . sodium chloride 75 mL/hr at 12/14/14 0300    Labs:  Recent Labs  12/13/14 0245 12/14/14 0330  NA 137 139  K 3.8 3.8  CL 104 107  CO2 25 26  GLUCOSE 162* 174*  BUN 18 19  CREATININE 1.73* 1.68*  CALCIUM 8.9 8.9    Recent Labs  12/13/14 0245  WBC 4.1  HGB 10.6*  HCT 32.4*  MCV 92.6  PLT 165   No results for input(s): CKTOTAL, CKMB, TROPONINI in the last 72 hours. Invalid input(s): POCBNP No results for input(s): HGBA1C in the last 72 hours.   Radiology/Studies:  No results found.   Assessment and Plan  32F with CAD (CABGx6 in 2002, known occ of SVG-RI in 2011, DES to [place] 09/2013), ICM, HTN, severe IDDM, obesity (Body mass index is 33.2 kg/(m^2)., asthma, PVD s/p LSFA PTA initally in 2012 and again in 2014 who presented to [facility] for planned cath given worsened reduction in EF to 35-40% with associated CHF sx.  1. CAD s/p CABGx6 in 2002, known occ of SVG-RI in 2011, DES to [place] 09/2013  - cath 12/12/14 showed patent LIMA-LAD, patent SVG to first diagonal (it also fills second diagonal. Stent in SVG is patent), patent SVG-PDA, occluded SVG-RI (chronic) -  s/p DES to prox ramus intermediate with DES, but not felt to be explain her drop in EF. - now on ASA + Plavix  2. Accelerated hypertension - BPs remain persistently elevated in the [id number] systolic range - ACEI/ARB on hold due to #4, unable to titrate BB due to HR, allergic to hydralazine - amlodipine added this admission - less ideal with low EF but options are quite limited. Will titrate amlodipine to 10mg  daily and Imdur to 60mg  BID. Consider titration of clonidine as well. - check UA to exclude nephrotic losses, renal artery duplex given h/o PVD, and TSH to exclude secondary causes  3. Ischemic cardiomyopathy/chronic systolic CHF with  recent drop in EF  - LVEDP elevated by cath thus Lasix increased but required IV fluid hydration as well for elevated Cr - will saline lock IV fluids - continue oral Lasix - ? Component of hypertensive heart disease given persistently elevated BPs  4. AKI on CKD stage III  - pre cath Cr 1.49, bumped to 1.73 with contrast + initiation of captopril and increase in Lasix - trended downward today, good UOP  5. Normocytic anemia - no known evidence of bleeding - f/u CBC in AM  Signed, Burna Mortimer Pager: [phone]   Patient seen and examined. Agree with assessment and plan.  C/o dyspnea last evening. Day 1 s/p DES stent to Ramus with patent grafts as above. Needs improved BP control; will titrate nitrates and amlodipine. Probable dc tomorrow.   Troy Sine, MD, [facility] 12/14/2014 12:42 PM

## 2014-12-14 NOTE — Progress Notes (Signed)
Preliminary results by tech - Renal Duplex Completed. Right renal artery , no evidence of stenosis noted. Left renal artery, mildly elevated velocities suggestive of less than 60% stenosis.  Oda Cogan, BS, RDMS, RVT

## 2014-12-14 NOTE — Progress Notes (Signed)
CARDIAC REHAB PHASE I   PRE:  Rate/Rhythm: 70 SR  BP:  Supine: 212/109  Sitting:   Standing:    SaO2: 95%RA  MODE:  Ambulation: 0 ft  [id number] Came to walk with pt but BP too high as documented above. Also pt c/o headache and waiting for ultrasound. Encouraged pt to walk with staff later if BP more stable . We will follow up tomorrow. Kasandra Knudsen was delivered yesterday which should help pt with wobbliness during walks.        Graylon Good, RN BSN  12/14/2014 9:17 AM

## 2014-12-15 DIAGNOSIS — I2510 Atherosclerotic heart disease of native coronary artery without angina pectoris: Secondary | ICD-10-CM | POA: Diagnosis not present

## 2014-12-15 DIAGNOSIS — I10 Essential (primary) hypertension: Secondary | ICD-10-CM | POA: Diagnosis not present

## 2014-12-15 DIAGNOSIS — N183 Chronic kidney disease, stage 3 (moderate): Secondary | ICD-10-CM | POA: Diagnosis not present

## 2014-12-15 DIAGNOSIS — Z9861 Coronary angioplasty status: Secondary | ICD-10-CM

## 2014-12-15 DIAGNOSIS — Z951 Presence of aortocoronary bypass graft: Secondary | ICD-10-CM | POA: Diagnosis not present

## 2014-12-15 DIAGNOSIS — I5041 Acute combined systolic (congestive) and diastolic (congestive) heart failure: Secondary | ICD-10-CM | POA: Diagnosis not present

## 2014-12-15 DIAGNOSIS — I2584 Coronary atherosclerosis due to calcified coronary lesion: Secondary | ICD-10-CM | POA: Diagnosis not present

## 2014-12-15 DIAGNOSIS — I255 Ischemic cardiomyopathy: Secondary | ICD-10-CM | POA: Diagnosis not present

## 2014-12-15 DIAGNOSIS — Z23 Encounter for immunization: Secondary | ICD-10-CM | POA: Diagnosis not present

## 2014-12-15 DIAGNOSIS — Z955 Presence of coronary angioplasty implant and graft: Secondary | ICD-10-CM | POA: Diagnosis not present

## 2014-12-15 DIAGNOSIS — I25119 Atherosclerotic heart disease of native coronary artery with unspecified angina pectoris: Secondary | ICD-10-CM | POA: Diagnosis not present

## 2014-12-15 LAB — BASIC METABOLIC PANEL
ANION GAP: 7 (ref 5–15)
BUN: 19 mg/dL (ref 6–20)
CALCIUM: 9.4 mg/dL (ref 8.9–10.3)
CO2: 27 mmol/L (ref 22–32)
Chloride: 104 mmol/L (ref 101–111)
Creatinine, Ser: 1.61 mg/dL — ABNORMAL HIGH (ref 0.44–1.00)
GFR calc Af Amer: 39 mL/min — ABNORMAL LOW (ref >60)
GFR calc non Af Amer: 34 mL/min — ABNORMAL LOW (ref >60)
GLUCOSE: 128 mg/dL — AB (ref 65–99)
Potassium: 3.6 mmol/L (ref 3.5–5.1)
Sodium: 138 mmol/L (ref 135–145)

## 2014-12-15 LAB — GLUCOSE, CAPILLARY
GLUCOSE-CAPILLARY: 108 mg/dL — AB (ref 65–99)
Glucose-Capillary: 189 mg/dL — ABNORMAL HIGH (ref 65–99)
Glucose-Capillary: 146 mg/dL — ABNORMAL HIGH (ref 65–99)

## 2014-12-15 LAB — CBC
HCT: 34.7 % — ABNORMAL LOW (ref 36.0–46.0)
HEMOGLOBIN: 11.5 g/dL — AB (ref 12.0–15.0)
MCH: 30.3 pg (ref 26.0–34.0)
MCHC: 33.1 g/dL (ref 30.0–36.0)
MCV: 91.6 fL (ref 78.0–100.0)
Platelets: 194 10*3/uL (ref 150–400)
RBC: 3.79 MIL/uL — ABNORMAL LOW (ref 3.87–5.11)
RDW: 14 % (ref 11.5–15.5)
WBC: 5 10*3/uL (ref 4.0–10.5)

## 2014-12-15 MED ORDER — LORATADINE 10 MG PO TABS: 10.0000 mg | ORAL_TABLET | Freq: Every day | ORAL | Status: DC | PRN

## 2014-12-15 MED ORDER — INSULIN ASPART 100 UNIT/ML FLEXPEN: 15.0000 [IU] | PEN_INJECTOR | Freq: Three times a day (TID) | SUBCUTANEOUS | Status: DC

## 2014-12-15 MED ORDER — AMLODIPINE BESYLATE 10 MG PO TABS
10.0000 mg | ORAL_TABLET | Freq: Every day | ORAL | Status: DC
Start: 2014-12-15 — End: 2014-12-15
  Administered 2014-12-15: 10:00:00 10 mg via ORAL
  Filled 2014-12-15: qty 1

## 2014-12-15 MED ORDER — ISOSORBIDE MONONITRATE ER 60 MG PO TB24
60.0000 mg | ORAL_TABLET | Freq: Every day | ORAL | Status: DC
  Filled 2014-12-15: qty 1

## 2014-12-15 MED ORDER — AMLODIPINE BESYLATE 10 MG PO TABS: 10.0000 mg | ORAL_TABLET | Freq: Every day | ORAL | Status: DC

## 2014-12-15 MED ORDER — DOXAZOSIN MESYLATE 2 MG PO TABS
2.0000 mg | ORAL_TABLET | Freq: Every day | ORAL | Status: DC
  Administered 2014-12-15: 13:00:00 2 mg via ORAL
  Filled 2014-12-15: qty 1

## 2014-12-15 MED ORDER — DOXAZOSIN MESYLATE 2 MG PO TABS: 2.0000 mg | ORAL_TABLET | Freq: Every day | ORAL | Status: DC

## 2014-12-15 MED ORDER — INSULIN GLARGINE 300 UNIT/ML ~~LOC~~ SOPN: 50.0000 [IU] | PEN_INJECTOR | Freq: Every morning | SUBCUTANEOUS | Status: DC

## 2014-12-15 MED ORDER — ISOSORBIDE MONONITRATE ER 60 MG PO TB24
60.0000 mg | ORAL_TABLET | Freq: Every morning | ORAL | Status: DC
  Administered 2014-12-15: 10:00:00 60 mg via ORAL
  Filled 2014-12-15: qty 1

## 2014-12-15 MED ORDER — ACETAMINOPHEN-CODEINE #3 300-30 MG PO TABS: 1.0000 | ORAL_TABLET | ORAL | Status: DC | PRN

## 2014-12-15 MED ORDER — FUROSEMIDE 40 MG PO TABS: 40.0000 mg | ORAL_TABLET | Freq: Every day | ORAL | Status: DC

## 2014-12-15 MED ORDER — FUROSEMIDE 40 MG PO TABS
40.0000 mg | ORAL_TABLET | Freq: Every day | ORAL | Status: DC
  Administered 2014-12-15: 10:00:00 40 mg via ORAL
  Filled 2014-12-15: qty 1

## 2014-12-15 NOTE — Progress Notes (Signed)
Subjective:  N0 chest pain or SOB  Objective:  Vital Signs in the last 24 hours: Temp:  [98.5 F (36.9 C)-99 F (37.2 C)] 99 F (37.2 C) (09/01 0413) Pulse Rate:  [64-69] 65 (09/01 0413) Resp:  [18-20] 18 (09/01 0413) BP: (181-213)/(67-98) 189/76 mmHg (09/01 0413) SpO2:  [93 %-98 %] 94 % (09/01 0413)  Intake/Output from previous day:  Intake/Output Summary (Last 24 hours) at 12/15/14 0850 Last data filed at 12/15/14 0415  Gross per 24 hour  Intake      0 ml  Output   2600 ml  Net  -2600 ml    Physical Exam: General appearance: alert, cooperative, no distress and moderately obese Lungs: clear to auscultation bilaterally Heart: regular rate and rhythm Extremities: Lt radial site without hematoma Skin: Skin color, texture, turgor normal. No rashes or lesions Neurologic: Grossly normal   Rate: 65  Rhythm: normal sinus rhythm  Lab Results:  Recent Labs  12/13/14 0245 12/15/14 0424  WBC 4.1 5.0  HGB 10.6* 11.5*  PLT 165 194    Recent Labs  12/14/14 0330 12/15/14 0424  NA 139 138  K 3.8 3.6  CL 107 104  CO2 26 27  GLUCOSE 174* 128*  BUN 19 19  CREATININE 1.68* 1.61*   BMP Latest Ref Rng 12/15/2014 12/14/2014 12/13/2014  Glucose 65 - 99 mg/dL 128(H) 174(H) 162(H)  BUN 6 - 20 mg/dL 19 19 18   Creatinine 0.44 - 1.00 mg/dL 1.61(H) 1.68(H) 1.73(H)  BUN/Creat Ratio 9 - 23 - - -  Sodium 135 - 145 mmol/L 138 139 137  Potassium 3.5 - 5.1 mmol/L 3.6 3.8 3.8  Chloride 101 - 111 mmol/L 104 107 104  CO2 22 - 32 mmol/L 27 26 25   Calcium 8.9 - 10.3 mg/dL 9.4 8.9 8.9    No results for input(s): TROPONINI in the last 72 hours.  Invalid input(s): CK, MB No results for input(s): INR in the last 72 hours.  Scheduled Meds: . allopurinol  300 mg Oral Daily  . amLODipine  10 mg Oral Daily  . aspirin  81 mg Oral Daily  . atorvastatin  40 mg Oral Daily  . bisacodyl  10 mg Rectal Once  . cloNIDine  0.2 mg Oral TID  . clopidogrel  75 mg Oral QHS  . ezetimibe  10 mg  Oral Daily  . furosemide  40 mg Oral Daily  . gabapentin  400 mg Oral TID  . insulin aspart  0-20 Units Subcutaneous TID WC  . insulin glargine  50 Units Subcutaneous Daily  . isosorbide mononitrate  60 mg Oral q morning - 10a   And  . isosorbide mononitrate  60 mg Oral QHS  . levothyroxine  25 mcg Oral QAC breakfast  . loratadine  10 mg Oral Daily  . metoprolol succinate  150 mg Oral Daily  . polyethylene glycol  17 g Oral Daily  . sodium chloride  3 mL Intravenous Q12H  . traZODone  25 mg Oral QHS   Continuous Infusions:  PRN Meds:.sodium chloride, acetaminophen, albuterol, fluticasone, nitroGLYCERIN, ondansetron (ZOFRAN) IV, sodium chloride   Imaging: Imaging results have been reviewed  Cardiac Studies: Echo 11/14/14 Study Conclusions  - Left ventricle: The cavity size was normal. Wall thickness was normal. Systolic function was moderately reduced. The estimated ejection fraction was in the range of 35% to 40%. Diffuse hypokinesis. Features are consistent with a pseudonormal left ventricular filling pattern, with concomitant abnormal relaxation and increased filling pressure (grade 2  diastolic dysfunction). - Mitral valve: There was mild to moderate regurgitation. - Left atrium: The atrium was mildly dilated. - Right atrium: The atrium was mildly dilated.   Assessment/Plan:  59 y.o. AA female with HTN, CRI, DM, and CAD- s/p CABG x 6 on '02, SVG-Dx DES in 2015, who had some CHF as an OP and an echo that showed her LV EF had dropped from normal down to 35-40 percent. No regional wall motion abnormalities were described, but Doppler velocity suggested elevated filling pressures. Roughly one year ago both echocardiogram and nuclear scintigraphy showed normal left ventricular systolic function.The pt has know CAD, last coronary angiography June 2015 showed severe disease in the native coronary arteries, patent LIMA to LAD, patent SVG to PDA , severely stenosed SVG to  diagonal which was treated with a 3.5 x 23 mm Xience Alpine drug-eluting stent , chronic occlusion of the SVG to the intermediate branch. There was a stenosis in the ramus intermedius artery that was felt to be amenable to PCI , but left for medical therapy since it was not the culprit for acute coronary syndrome and also to avoid the risk of contrast-induced nephropathy.  She saw Dr Sallyanne Kuster in the office 12/02/14 and it was decided to proceed with diagnostic cath. This was done 12/14/14. At cath she had a patent LIMA-LAD, patent SVG-Dx with patent stent, patent SVG-PDA, occluded SVG-RI (old). She underwent successful stenting of the native RI with a DES. Lasix was increased to 40 mg SCr at discharge 1.61. Her B/P is poorly controlled and her medications will need close adjustment as an OP.      Principal Problem:   Acute combined systolic and diastolic heart failure Active Problems:   Hx of CABG x 6 2002   CAD -S/P PCI June 2015 and 12/14/14   Ischemic cardiomyopathy- new drop in EF   HTN (hypertension), malignant, patent renal arteries   CKD (chronic kidney disease), stage III   Type 2 diabetes mellitus with chronic kidney disease   PVD, LSFA PTA 12/12   Dyslipidemia   Diabetic neuropathy, type II diabetes mellitus   PLAN: B/P is uncontrolled but she hasn't had her morning medications yet.. Will review with MD. Not on ACE or ARB secondary to CRI, ? Increase Clonidine to 0.3 mg TID.   Kerin Ransom PA-C 12/15/2014, 8:50 AM [id number]   Patient seen and examined. Agree with assessment and plan. S/P PCI on 12/12/14. No chest pain. BP this am still elevated at [id number] systolic. ? Allergy to hydralazine in epic with shortness of breath. With DM may still benefit from attempt at low dose ARB; Cr slightly improved at 1.61, but not yet at baseline (was 1.49;  9 days ago),  Wait for further improvement prior to starting. Will increase imdur to 90 mg. Consider renal duplex scan as outpatient. Will  re-asses later today for ? DC today.  Troy Sine, MD, [facility] 12/15/2014 10:12 AM

## 2014-12-15 NOTE — Discharge Summary (Signed)
Patient ID: Brooke Stafford,  MRN: [id number], DOB/AGE: Sep 13, 1955 59 y.o.  Admit date: 12/12/2014 Discharge date: 12/15/2014  Primary Care Provider: Angelica Chessman, MD Primary Cardiologist: Dr Sallyanne Kuster  Discharge Diagnoses Principal Problem:   Acute combined systolic and diastolic heart failure Active Problems:   Hx of CABG x 6 2002   CAD -S/P PCI June 2015 and 12/14/14   Ischemic cardiomyopathy- new drop in EF   HTN (hypertension), malignant, patent renal arteries   CKD (chronic kidney disease), stage III   Type 2 diabetes mellitus with chronic kidney disease   PVD, LSFA PTA 12/12   Dyslipidemia   Diabetic neuropathy, type II diabetes mellitus   Hypertension    Procedures: Cath/ PCI with DES to native [facility] 12/14/14   Hospital Course:  59 y.o. AA female with HTN, CRI, DM, and CAD- s/p CABG x 6 on '02, SVG-Dx DES in 2015, who had some CHF as an OP and an echo that showed her LV EF had dropped from normal down to 35-40 percent. No regional wall motion abnormalities were described, but Doppler velocity suggested elevated filling pressures. Roughly one year ago both echocardiogram and nuclear scintigraphy showed normal left ventricular systolic function.The pt has know CAD, last coronary angiography June 2015 showed severe disease in the native coronary arteries, patent LIMA to LAD, patent SVG to PDA , severely stenosed SVG to diagonal which was treated with a 3.5 x 23 mm Xience Alpine drug-eluting stent , chronic occlusion of the SVG to the intermediate branch. There was a stenosis in the ramus intermedius artery that was felt to be amenable to PCI , but left for medical therapy since it was not the culprit for acute coronary syndrome and also to avoid the risk of contrast-induced nephropathy.  She saw Dr Sallyanne Kuster in the office 12/02/14 and it was decided to proceed with diagnostic cath. This was done 12/14/14. At cath she had a patent LIMA-LAD, patent SVG-Dx with patent stent,  patent SVG-PDA, occluded SVG-RI (old). She underwent successful stenting of the native RI with a DES. Lasix was increased to 40 mg SCr at discharge 1.61. Her B/P is poorly controlled and her medications will need close adjustment as an OP. I spoke with the MD on call for [place] who suggested adding Cardura  She has been followed by Dr Arty Baumgartner as an OP. An ARB was felt to be contraindicated at this point. Dr Claiborne Billings has suggested renal artery dopplers if not already done- this can be reviewed at f/u.   Discharge Vitals:  Blood pressure 196/87, pulse 68, temperature 98.7 F (37.1 C), temperature source Oral, resp. rate 19, height 5' 10.5" (1.791 m), weight 234 lb 12.6 oz (106.5 kg), last menstrual period 10/13/2014, SpO2 94 %.    Labs: Results for orders placed or performed during the hospital encounter of 12/12/14 (from the past 24 hour(s))  Urinalysis, Routine w reflex microscopic (not at [facility])     Status: Abnormal   Collection Time: 12/14/14  5:00 PM  Result Value Ref Range   Color, Urine YELLOW YELLOW   APPearance CLEAR CLEAR   Specific Gravity, Urine 1.007 1.005 - 1.030   pH 6.0 5.0 - 8.0   Glucose, UA NEGATIVE NEGATIVE mg/dL   Hgb urine dipstick SMALL (A) NEGATIVE   Bilirubin Urine NEGATIVE NEGATIVE   Ketones, ur NEGATIVE NEGATIVE mg/dL   Protein, ur 100 (A) NEGATIVE mg/dL   Urobilinogen, UA 0.2 0.0 - 1.0 mg/dL   Nitrite NEGATIVE NEGATIVE  Leukocytes, UA NEGATIVE NEGATIVE  Urine microscopic-add on     Status: None   Collection Time: 12/14/14  5:00 PM  Result Value Ref Range   Squamous Epithelial / LPF RARE RARE   WBC, UA 0-2 <3 WBC/hpf   RBC / HPF 0-2 <3 RBC/hpf   Bacteria, UA RARE RARE  Glucose, capillary     Status: Abnormal   Collection Time: 12/14/14  5:10 PM  Result Value Ref Range   Glucose-Capillary 121 (H) 65 - 99 mg/dL  Glucose, capillary     Status: Abnormal   Collection Time: 12/14/14  9:25 PM  Result Value Ref Range   Glucose-Capillary 175  (H) 65 - 99 mg/dL  Basic metabolic panel     Status: Abnormal   Collection Time: 12/15/14  4:24 AM  Result Value Ref Range   Sodium 138 135 - 145 mmol/L   Potassium 3.6 3.5 - 5.1 mmol/L   Chloride 104 101 - 111 mmol/L   CO2 27 22 - 32 mmol/L   Glucose, Bld 128 (H) 65 - 99 mg/dL   BUN 19 6 - 20 mg/dL   Creatinine, Ser 1.61 (H) 0.44 - 1.00 mg/dL   Calcium 9.4 8.9 - 10.3 mg/dL   GFR calc non Af Amer 34 (L) >60 mL/min   GFR calc Af Amer 39 (L) >60 mL/min   Anion gap 7 5 - 15  CBC     Status: Abnormal   Collection Time: 12/15/14  4:24 AM  Result Value Ref Range   WBC 5.0 4.0 - 10.5 K/uL   RBC 3.79 (L) 3.87 - 5.11 MIL/uL   Hemoglobin 11.5 (L) 12.0 - 15.0 g/dL   HCT 34.7 (L) 36.0 - 46.0 %   MCV 91.6 78.0 - 100.0 fL   MCH 30.3 26.0 - 34.0 pg   MCHC 33.1 30.0 - 36.0 g/dL   RDW 14.0 11.5 - 15.5 %   Platelets 194 150 - 400 K/uL  Glucose, capillary     Status: Abnormal   Collection Time: 12/15/14  6:43 AM  Result Value Ref Range   Glucose-Capillary 108 (H) 65 - 99 mg/dL  Glucose, capillary     Status: Abnormal   Collection Time: 12/15/14 12:31 PM  Result Value Ref Range   Glucose-Capillary 189 (H) 65 - 99 mg/dL    Disposition:  Follow-up Information    Follow up with Sanda Klein, MD.   Specialty:  Cardiology   Contact information:   [address] [place] [address] [phone]       Discharge Medications:    Medication List    TAKE these medications        acetaminophen-codeine 300-30 MG per tablet  Commonly known as:  TYLENOL #3  Take 1 tablet by mouth every 4 (four) hours as needed for moderate pain.     albuterol 108 (90 BASE) MCG/ACT inhaler  Commonly known as:  PROVENTIL HFA;VENTOLIN HFA  Inhale 2 puffs into the lungs every 6 (six) hours as needed for wheezing or shortness of breath.     allopurinol 300 MG tablet  Commonly known as:  ZYLOPRIM  Take 1 tablet (300 mg total) by mouth daily.     amLODipine 10 MG tablet  Commonly known as:   NORVASC  Take 1 tablet (10 mg total) by mouth daily.     aspirin 81 MG chewable tablet  Chew 1 tablet (81 mg total) by mouth daily.     atorvastatin 40 MG tablet  Commonly known  as:  LIPITOR  Take 1 tablet (40 mg total) by mouth daily.     cloNIDine 0.2 MG tablet  Commonly known as:  CATAPRES  Take 1 tablet (0.2 mg total) by mouth 2 (two) times daily.     clopidogrel 75 MG tablet  Commonly known as:  PLAVIX  Take 1 tablet (75 mg total) by mouth daily.     cyclobenzaprine 10 MG tablet  Commonly known as:  FLEXERIL  Take 1 tablet (10 mg total) by mouth 3 (three) times daily as needed for muscle spasms.     doxazosin 2 MG tablet  Commonly known as:  CARDURA  Take 1 tablet (2 mg total) by mouth at bedtime.     fluticasone 50 MCG/ACT nasal spray  Commonly known as:  FLONASE  Place 2 sprays into both nostrils daily as needed for allergies or rhinitis.     furosemide 40 MG tablet  Commonly known as:  LASIX  Take 1 tablet (40 mg total) by mouth daily.     gabapentin 400 MG capsule  Commonly known as:  NEURONTIN  Take 1 capsule (400 mg total) by mouth 3 (three) times daily.     insulin aspart 100 UNIT/ML FlexPen  Commonly known as:  NOVOLOG FLEXPEN  Inject 15-30 Units into the skin 3 (three) times daily with meals. SSI     Insulin Glargine 300 UNIT/ML Sopn  Commonly known as:  TOUJEO SOLOSTAR  Inject 50 Units into the skin every morning.     Insulin Pen Needle 32G X 4 MM Misc  Use to inject insulin daily as instructed.     Insulin Syringes (Disposable) U-100 1 ML Misc  Commonly known as:  B-D INSULIN SYRINGE 1CC  Use to inject insulin 4 times daily as instructed.     isosorbide mononitrate 60 MG 24 hr tablet  Commonly known as:  IMDUR  Take 1.5 tablets (90 mg total) by mouth daily.     levothyroxine 25 MCG tablet  Commonly known as:  SYNTHROID, LEVOTHROID  take 1 tablet by mouth every morning BEFORE BREAKFAST     loratadine 10 MG tablet  Commonly known as:   CLARITIN  Take 1 tablet (10 mg total) by mouth daily as needed for allergies.     metoprolol succinate 50 MG 24 hr tablet  Commonly known as:  TOPROL-XL  Take 3 tablets (150 mg total) by mouth daily. Take with or immediately following a meal.     nitroGLYCERIN 0.4 MG SL tablet  Commonly known as:  NITROSTAT  Place 1 tablet (0.4 mg total) under the tongue every 5 (five) minutes as needed. For chest     TIGER BALM ARTHRITIS RUB 11-11 % Crea  Generic drug:  Menthol-Camphor  Apply 1 application topically 2 (two) times daily.     traZODone 50 MG tablet  Commonly known as:  DESYREL  Take 0.5 tablets (25 mg total) by mouth at bedtime.     ZETIA 10 MG tablet  Generic drug:  ezetimibe  take 1 tablet by mouth once daily         Duration of Discharge Encounter: Greater than 30 minutes including physician time.  Angelena Form PA-C 12/15/2014 1:13 PM

## 2014-12-15 NOTE — Progress Notes (Signed)
CARDIAC REHAB PHASE I   PRE:  Rate/Rhythm: 65 SR  BP:  Supine: 190/76  Sitting:   Standing:    SaO2:   MODE:  Ambulation: 500 ft   POST:  Rate/Rhythm: 80 SR  BP:  Supine: 206/95, 215/90  Sitting:   Standing:    SaO2:  [id number] Pt walked 500 ft with her cane and she was steadier. Pt stated cane helped her a lot. No c/o headache or dizziness but BP elevated more with walk. Cardiology aware.   Graylon Good, RN BSN  12/15/2014 9:22 AM

## 2014-12-20 ENCOUNTER — Other Ambulatory Visit: Payer: Self-pay | Admitting: Internal Medicine

## 2014-12-21 ENCOUNTER — Telehealth: Payer: Self-pay | Admitting: *Deleted

## 2014-12-21 NOTE — Telephone Encounter (Signed)
Signed order for Phase II Cardiac Rehab faxed to [place]. 

## 2014-12-22 ENCOUNTER — Other Ambulatory Visit: Payer: Medicare Other | Admitting: Obstetrics & Gynecology

## 2014-12-27 ENCOUNTER — Encounter: Payer: Self-pay | Admitting: Internal Medicine

## 2014-12-27 ENCOUNTER — Ambulatory Visit (INDEPENDENT_AMBULATORY_CARE_PROVIDER_SITE_OTHER): Payer: Medicare Other | Admitting: Internal Medicine

## 2014-12-27 VITALS — BP 138/72 | HR 75 | Temp 98.5°F | Resp 12 | Wt 235.0 lb

## 2014-12-27 DIAGNOSIS — N189 Chronic kidney disease, unspecified: Secondary | ICD-10-CM

## 2014-12-27 DIAGNOSIS — I255 Ischemic cardiomyopathy: Secondary | ICD-10-CM

## 2014-12-27 DIAGNOSIS — E1122 Type 2 diabetes mellitus with diabetic chronic kidney disease: Secondary | ICD-10-CM | POA: Diagnosis not present

## 2014-12-27 MED ORDER — INSULIN GLARGINE 300 UNIT/ML ~~LOC~~ SOPN: 40.0000 [IU] | PEN_INJECTOR | Freq: Every day | SUBCUTANEOUS | Status: DC

## 2014-12-27 MED ORDER — INSULIN ASPART 100 UNIT/ML FLEXPEN: 10.0000 [IU] | PEN_INJECTOR | Freq: Three times a day (TID) | SUBCUTANEOUS | Status: DC

## 2014-12-27 NOTE — Progress Notes (Signed)
Patient ID: Brooke Stafford, female   DOB: 11-19-1955, 59 y.o.   MRN: [id number]  HPI: Brooke Stafford is a 59 y.o.-year-old female, returning for f/u for DM2, dx 2006, insulin-dependent since 2012, uncontrolled, with complications (CKD, iCMP, CAD - s/p CABG x 6, 2002, PN). Last visit 4.5 mo ago.  She had a heart catheterization (hospitalized for 4 days 2/2 high BP values after the cath). She had a stent placed.   Last hemoglobin A1c was: Lab Results  Component Value Date   HGBA1C 6.30 11/10/2014   HGBA1C 6.5 08/09/2014   HGBA1C 9.1* 05/03/2014  She had a steroid inj for gout in 10/19/2013.  Pt is on a regimen of: She may forget to take the insulin. - Toujeo 50 units daily - Novolog - pen: 10-20 units before breakfast  If sugars before a meal are 80-150: take the whole amount of mealtime insulin with that meal, but not the Sliding scale  If sugars are 60-80, take only half of the mealtime insulin and no sliding scale.  If sugars <60, do not take any insulin with that meal.  - Occasionally uses the sliding scale of NovoLog - added 11/2013: - 150- 165: + 1 unit  - 166- 180: + 2 units  - 181- 195: + 3 units  - 196- 210: + 4 units  - 211- 225: + 5 units              - >225: + 6 units  We stopped Glimepiride 4 mg in am.  She stopped Metformin (abd.pain, CKD).  We stopped Tradjenta 5 mg daily.  Pt checks her sugars 3 a day and they are good, but has lows if she delays a meal: - am: 134-194 >> 150-228 >> 120-182 >> 87-155, 176 >> 65-135, 161 - 2h after b'fast: 240-250 >> 197 >> n/c - before lunch: 152-190, 201 x1 >> 118, 180-262 >> 78, 151-192, 200s >> 77-174 >> 49's, 67-144 - 2h after lunch: 160-170 >> 270 x1 > n/c - before dinner: 152-198 >> 173, 194-288, 352 >> 130-190, some 200s >> 73-151, 185 >> 71-160 - 2h after dinner: high 100s-215 >> 151, 159, 343 (cookies) >> n/c - bedtime: 176, 193, 223 (cookies) >> n/c + lows. Lowest sugar was 49 x1; she has hypoglycemia awareness at  100.  Highest sugar was 200s >> 185 >> 164.  Pt's meals are: - Breakfast: toast 2 slices - Lunch: sandwich, fruit - Dinner: meat, veggies, 1 starch - Snacks: 2: fruit or sandwich, diet Pepsi, 1/2 banana, PB crackers  - She has CKD, last BUN/creatinine:  Lab Results  Component Value Date   BUN 19 12/15/2014   CREATININE 1.61* 12/15/2014  Decreased from 1.8. Not on ACEI.  - last set of lipids: Lab Results  Component Value Date   CHOL 180 08/04/2013   HDL 35* 08/04/2013   LDLCALC 99 08/04/2013   TRIG 230* 08/04/2013   CHOLHDL 5.1 08/04/2013  On Zetia and Lipitor. Takes 1 ASA a day (81 mg) - last eye exam was in 10/01/2014. No DR reportedly. She has mild glaucoma.  - + numbness and tingling in her feet. She has PN >> takes gabapentin 400 mg 1-2x a day.   I reviewed pt's medications, allergies, PMH, social hx, family hx, and changes were documented in the history of present illness. Otherwise, unchanged from my initial visit note. Sister had a stroke recently >> pt stressed.  ROS: Constitutional: no weight gain, no fatigue, + hot flushes, +  poor sleep Eyes: no blurry vision, no xerophthalmia ENT: no sore throat, no nodules palpated in throat, no dysphagia/odynophagia, + hoarseness, + tinnitus, + hypoacusis Cardiovascular: +CP - resolved/+ SOB/no palpitations/leg swelling Respiratory: no cough/+ SOB/+ wheezing Gastrointestinal: no N/V/D/C, no heartburn Musculoskeletal: + muscle aches/+ joint aches Skin: no rash, no itching Neurological: no tremors/numbness/tingling/dizziness, no HA  Past Medical History  Diagnosis Date  . Asthma   . Anxiety   . GERD (gastroesophageal reflux disease)   . Coronary artery disease 2002    CABG x 6. Cath 5/11- med Rx  . Hypertension   . Peripheral vascular disease 12/12    LSFA PTA  . Anemia   . Hyperlipidemia   . Chronic renal insufficiency, stage II (mild)     followed  by [facility]  . CAD (coronary artery disease) 2002; 2015     CABG x 6 2002, cath 2011- med Rx stent DES VG-Diag  . Hypothyroid     treated  . Obesity (BMI 35.0-39.9 without comorbidity)   . CHF (congestive heart failure)     "in 2002" (11/26/2012)  . Chronic bronchitis     "q year; in the winter"   . Type II diabetes mellitus   . History of blood transfusion 2002    "when I had OHS"  . Gout     "right big toe"  . Anginal pain   . CAD (coronary artery disease) of artery bypass graft; DES to VG-Diag 09/28/13 11/09/2013  . Myocardial infarction 2000; 2002; 2011  . Pneumonia     "3 times I think" (12/12/2014)  . Migraines     "couple times/year" (08/04/2013), (12/12/2014)  . Headache     "~ q week" (08/04/2013); "~ twice/month" (12/12/2014)  . Arthritis     "stiff fingers and knees" (08/04/2013), (12/12/2014)   Past Surgical History  Procedure Laterality Date  . Cholecystectomy  1982  . Cesarean section  1978; 1980  . Tubal ligation  1980  . Breast cyst excision Right 1970's  . Coronary artery bypass graft  11/20/2000    x6 LIMA to distal LAD, svg to first diag, svg to ramus intermediate branch and swquential SVG to cir marginal branch, SVG to posterior descending coronary and sequential SVG to first right posterolateral branch  . Peripheral arterial stent graft Left     SFA/notes 04/07/2011 (11/30/2012)  . Nm myocar perf wall motion  08/27/2004    negative  . Appendectomy  1980  . Abdominal aortagram N/A 04/05/2011    Procedure: ABDOMINAL AORTAGRAM;  Surgeon: Lorretta Harp, MD;  Location[facility] CATH LAB;  Service: Cardiovascular;  Laterality: N/A;  . Renal angiogram N/A 04/05/2011    Procedure: RENAL ANGIOGRAM;  Surgeon: Lorretta Harp, MD;  Location: [facility] CATH LAB;  Service: Cardiovascular;  Laterality: N/A;  . Lower extremity angiogram  12/01/2012    Procedure: LOWER EXTREMITY ANGIOGRAM;  Surgeon: Lorretta Harp, MD;  Location: [facility] CATH LAB;  Service: Cardiovascular;;  . Percutaneous stent intervention Left 12/01/2012    Procedure: PERCUTANEOUS  STENT INTERVENTION;  Surgeon: Lorretta Harp, MD;  Location: [facility] CATH LAB;  Service: Cardiovascular;  Laterality: Left;  Left SFA  . Left heart catheterization with coronary/graft angiogram N/A 09/28/2013    Procedure: LEFT HEART CATHETERIZATION WITH Beatrix Fetters;  Surgeon: Peter M Martinique, MD;  Location: [facility] CATH LAB;  Service: Cardiovascular;  Laterality: N/A;  . Cardiac catheterization  2002  . Coronary angioplasty with stent placement  2004; 2012    "I have  2 stents" (08/04/2013)  . Coronary angioplasty with stent placement  09/28/13    PTCA/ DES Xience stent to VG-Diag   . Cardiac catheterization N/A 12/12/2014    Procedure: Left Heart Cath and Cors/Grafts Angiography;  Surgeon: Peter M Martinique, MD;  Location: [place] CV LAB;  Service: Cardiovascular;  Laterality: N/A;  . Cardiac catheterization N/A 12/12/2014    Procedure: Coronary Stent Intervention;  Surgeon: Peter M Martinique, MD;  Location: [place] CV LAB;  Service: Cardiovascular;  Laterality: N/A;   Social History   Social History  . Marital Status: Divorced    Spouse Name: N/A  . Number of Children: N/A  . Years of Education: N/A   Occupational History  . Not on file.   Social History Main Topics  . Smoking status: Former Smoker -- 1.00 packs/day for 25 years    Types: Cigarettes    Quit date: 04/04/2000  . Smokeless tobacco: Never Used  . Alcohol Use: Yes     Comment: 12/12/2014  "have a glass of red wine on my birthday q yr; that's it"  . Drug Use: No  . Sexual Activity: Not Currently    Birth Control/ Protection: Abstinence   Other Topics Concern  . Not on file   Social History Narrative   Current Outpatient Prescriptions on File Prior to Visit  Medication Sig Dispense Refill  . acetaminophen-codeine (TYLENOL #3) 300-30 MG per tablet Take 1 tablet by mouth every 4 (four) hours as needed for moderate pain. 60 tablet 0  . albuterol (PROVENTIL HFA;VENTOLIN HFA) 108 (90 BASE) MCG/ACT inhaler Inhale 2  puffs into the lungs every 6 (six) hours as needed for wheezing or shortness of breath. 3 Inhaler 3  . allopurinol (ZYLOPRIM) 300 MG tablet Take 1 tablet (300 mg total) by mouth daily. 90 tablet 3  . amLODipine (NORVASC) 10 MG tablet Take 1 tablet (10 mg total) by mouth daily. 30 tablet 11  . aspirin 81 MG chewable tablet Chew 1 tablet (81 mg total) by mouth daily.    [name] atorvastatin (LIPITOR) 40 MG tablet Take 1 tablet (40 mg total) by mouth daily. 90 tablet 3  . cloNIDine (CATAPRES) 0.2 MG tablet Take 1 tablet (0.2 mg total) by mouth 2 (two) times daily. 180 tablet 3  . clopidogrel (PLAVIX) 75 MG tablet Take 1 tablet (75 mg total) by mouth daily. 30 tablet 6  . cyclobenzaprine (FLEXERIL) 10 MG tablet Take 1 tablet (10 mg total) by mouth 3 (three) times daily as needed for muscle spasms. 60 tablet 3  . doxazosin (CARDURA) 2 MG tablet Take 1 tablet (2 mg total) by mouth at bedtime. 30 tablet 11  . fluticasone (FLONASE) 50 MCG/ACT nasal spray Place 2 sprays into both nostrils daily as needed for allergies or rhinitis. 16 g 3  . furosemide (LASIX) 40 MG tablet Take 1 tablet (40 mg total) by mouth daily. 30 tablet 11  . gabapentin (NEURONTIN) 400 MG capsule Take 1 capsule (400 mg total) by mouth 3 (three) times daily. 270 capsule 3  . Insulin Pen Needle 32G X 4 MM MISC Use to inject insulin daily as instructed. 150 each 11  . Insulin Syringes, Disposable, (B-D INSULIN SYRINGE 1CC) U-100 1 ML MISC Use to inject insulin 4 times daily as instructed. 160 each 4  . isosorbide mononitrate (IMDUR) 60 MG 24 hr tablet Take 1.5 tablets (90 mg total) by mouth daily. 90 tablet 5  . levothyroxine (SYNTHROID, LEVOTHROID) 25 MCG tablet take 1 tablet  by mouth every morning BEFORE BREAKFAST 30 tablet 2  . loratadine (CLARITIN) 10 MG tablet Take 1 tablet (10 mg total) by mouth daily as needed for allergies. 30 tablet 11  . Menthol-Camphor (TIGER BALM ARTHRITIS RUB) 11-11 % CREA Apply 1 application topically 2 (two) times  daily.    . metoprolol succinate (TOPROL-XL) 50 MG 24 hr tablet Take 3 tablets (150 mg total) by mouth daily. Take with or immediately following a meal. 90 tablet 5  . nitroGLYCERIN (NITROSTAT) 0.4 MG SL tablet Place 1 tablet (0.4 mg total) under the tongue every 5 (five) minutes as needed. For chest 30 tablet 6  . traZODone (DESYREL) 50 MG tablet Take 0.5 tablets (25 mg total) by mouth at bedtime. 45 tablet 3  . ZETIA 10 MG tablet take 1 tablet by mouth once daily 90 tablet 2   No current facility-administered medications on file prior to visit.   Allergies  Allergen Reactions  . Hydralazine Shortness Of Breath  . Penicillins Cross Reactors Hives    And high fever  . Adhesive [Tape] Rash    bruising   Family History  Problem Relation Age of Onset  . Diabetes Mother   . Hypertension Mother   . Stroke Mother   . Hypertension Father   . Hypertension Brother   . Hypertension Sister   . Diabetes Sister   . Hyperlipidemia Sister    PE: BP 138/72 mmHg  Pulse 75  Temp(Src) 98.5 F (36.9 C) (Oral)  Resp 12  Wt 235 lb (106.595 kg)  SpO2 96%  LMP 10/13/2014 (Exact Date) Body mass index is 33.23 kg/(m^2).  Wt Readings from Last 3 Encounters:  12/27/14 235 lb (106.595 kg)  12/14/14 234 lb 12.6 oz (106.5 kg)  12/02/14 236 lb (107.049 kg)   Constitutional: obese, in NAD Eyes: PERRLA, EOMI, no exophthalmos ENT: moist mucous membranes, no thyromegaly, no cervical lymphadenopathy Cardiovascular: RRR, No MRG Respiratory: CTA B Gastrointestinal: abdomen soft, NT, ND, BS+ Musculoskeletal: no deformities, strength intact in all 4 Skin: moist, warm, no rashes, + hirsutism on chin Neurological: no tremor with outstretched hands, DTR normal in all 4  ASSESSMENT: 1. DM2, insulin-dependent, uncontrolled, with complications - CKD - Dr. Marval Regal - iCMP, CAD - s/p CABG x 6, 2002, s/p stent 2006, s/p stent 09/2013 - Dr. Sallyanne Kuster - PN  PLAN:  1. Patient with long-standing, uncontrolled  diabetes, on basal-bolus insulin regimen, with improvement in her sugars over last months. Sugars in the 40s if she delays a meal >> will reduce basal insulin and also max bolus:  Patient Instructions  Please decrease Toujeo to 40 units daily  Decrease Novolog: 10-15 units before breakfast If sugars before a meal are 80-150: take the whole amount of mealtime insulin with that meal, but not the Sliding scale If sugars are 60-80, take only half of the mealtime insulin and no sliding scale. If sugars <60, do not take any insulin with that meal.  Continue sliding scale of NovoLog: - 150- 165: + 1 unit  - 166- 180: + 2 units  - 181- 195: + 3 units  - 196- 210: + 4 units  - >210: + 5 units  - continue checking sugars at different times of the day - check 3 times a day, rotating checks. - up to date with yearly eye exams - reviewed last HbA1c = 6.3% (excellent) - refilled NovoLog - Return to clinic in 3 mo with sugar log

## 2014-12-27 NOTE — Patient Instructions (Signed)
Please decrease Toujeo to 40 units daily  Decrease Novolog: 10-15 units before breakfast If sugars before a meal are 80-150: take the whole amount of mealtime insulin with that meal, but not the Sliding scale If sugars are 60-80, take only half of the mealtime insulin and no sliding scale. If sugars <60, do not take any insulin with that meal.  Continue sliding scale of NovoLog: - 150- 165: + 1 unit  - 166- 180: + 2 units  - 181- 195: + 3 units  - 196- 210: + 4 units  - >210: + 5 units

## 2014-12-28 ENCOUNTER — Other Ambulatory Visit: Payer: Self-pay | Admitting: Cardiovascular Disease

## 2014-12-28 MED ORDER — CLOPIDOGREL BISULFATE 75 MG PO TABS: 75.0000 mg | ORAL_TABLET | Freq: Every day | ORAL | Status: DC

## 2014-12-28 NOTE — Telephone Encounter (Signed)
°  1. Which medications need to be refilled? Clopidogrel-Please call this in today 2. Which pharmacy is medication to be sent to?[facility] [phone] 3. Do they need a 30 day or 90 day supply? 90 and refills  4. Would they like a call back once the medication has been sent to the pharmacy? yes

## 2015-01-02 DIAGNOSIS — H2511 Age-related nuclear cataract, right eye: Secondary | ICD-10-CM | POA: Diagnosis not present

## 2015-01-02 DIAGNOSIS — H25811 Combined forms of age-related cataract, right eye: Secondary | ICD-10-CM | POA: Diagnosis not present

## 2015-01-03 ENCOUNTER — Ambulatory Visit: Payer: Medicare Other | Admitting: Cardiology

## 2015-01-03 DIAGNOSIS — H2512 Age-related nuclear cataract, left eye: Secondary | ICD-10-CM | POA: Diagnosis not present

## 2015-01-04 DIAGNOSIS — D631 Anemia in chronic kidney disease: Secondary | ICD-10-CM | POA: Diagnosis not present

## 2015-01-04 DIAGNOSIS — I129 Hypertensive chronic kidney disease with stage 1 through stage 4 chronic kidney disease, or unspecified chronic kidney disease: Secondary | ICD-10-CM | POA: Diagnosis not present

## 2015-01-04 DIAGNOSIS — N183 Chronic kidney disease, stage 3 (moderate): Secondary | ICD-10-CM | POA: Diagnosis not present

## 2015-01-04 DIAGNOSIS — E1129 Type 2 diabetes mellitus with other diabetic kidney complication: Secondary | ICD-10-CM | POA: Diagnosis not present

## 2015-01-05 ENCOUNTER — Telehealth: Payer: Self-pay | Admitting: Cardiovascular Disease

## 2015-01-05 NOTE — Telephone Encounter (Signed)
Received records from [facility] for appointment on 01/12/15 with Dr Sallyanne Kuster.  Records given to [facility] (medical records) for Dr Croitoru's schedule on 0/29/16. lp

## 2015-01-12 ENCOUNTER — Encounter: Payer: Self-pay | Admitting: Cardiovascular Disease

## 2015-01-12 ENCOUNTER — Ambulatory Visit (INDEPENDENT_AMBULATORY_CARE_PROVIDER_SITE_OTHER): Payer: Medicare Other | Admitting: Cardiovascular Disease

## 2015-01-12 VITALS — BP 146/82 | HR 66 | Ht 70.0 in | Wt 236.0 lb

## 2015-01-12 DIAGNOSIS — I255 Ischemic cardiomyopathy: Secondary | ICD-10-CM | POA: Diagnosis not present

## 2015-01-12 DIAGNOSIS — E785 Hyperlipidemia, unspecified: Secondary | ICD-10-CM | POA: Diagnosis not present

## 2015-01-12 NOTE — Progress Notes (Signed)
Patient ID: Brooke Stafford, female   DOB: Jan 06, 1956, 59 y.o.   MRN: [id number]     Cardiology Office Note   Date:  01/14/2015   ID:  Hetal, Altenhofen [birth year], MRN [id number]  PCP:  Angelica Chessman, MD  Cardiologist:   Sanda Klein, MD   Chief Complaint  Patient presents with  . 6 MONTHS  . Edema    in her feet, right foot hurting and is swollen more than the left foot      History of Present Illness: Brooke Stafford is a 59 y.o. female who presents for follow-up after recent hospitalization with uncontrolled systemic hypertension and a new drop in left ventricular ejection fraction,complicated by acute combined systolic and diastolic heart failure and placement of a new drug-eluting stent to the ramus intermedius artery on August 31 (Dr. Martinique).  Her breathing is markedly improved and her abdominal distention has resolved. Her "dry weight" seems to be roughly 235 pounds. She denies angina pectoris. She has not had any bleeding complications on aspirin/Plavix.  Glycemic control is markedly improved. Her hemoglobin A1c has been in the target 6-7% for 3 endocrinology visits in a row now.Zetia was added for reduction in LDL cholesterol (was 99, target less than 70)  She has a past medical history significant for severe type 2 diabetes mellitus requiring insulin therapy, systemic hypertension, hyperlipidemia, obesity, PVD S/P LSFA PTA initally in 2012 and again in 2014, coronary artery disease, S/P CABG x 6 in 2002. In July 2011 when she presented with sepsis and medication non compliance and had a NSTEMI from demand ischemia. Had mild ischemic cardiomyopathy with a left ventricular ejection fraction of 45-% by echo 5/11. In May 2015 both echo and nuclear stress testing showed normal LV EF around 60%. In June 2015 presented with crescendo angina and received a stent to the SVG to diagonal artery (3.5 x 23 mm Xience [place]). No change in other coronary/graft anatomy, persistent  stenosis in the ramus intermedius artery left for medical therapy. Echo in July 2016 showed reduction in EF to 35-40 %, associated with heart failure symptoms. Underwent cath and received new stent to ramus with cath results below:  Cath 12/12/14 1. Severe 3 vessel obstructive CAD 2. Patent LIMA to LAD 3. Patent SVG to first diagonal- it also fills second diagonal. Stent in SVG is patent. 4. Patent SVG to PDA 5. Occluded SVG to ramus intermediate. Chronic 6. Successful stenting of the proximal ramus intermediate with a DES.  7. Elevated LV EDP (33 mm Hg) Diagnostic Diagram           Post-Intervention Diagram            Implants    Name ID Temporary Type Supply   STENT SYNERGY DES 2.75X20 [id number] No Permanent Stent STENT SYNERGY DES 2.75X20     Past Medical History  Diagnosis Date  . Asthma   . Anxiety   . GERD (gastroesophageal reflux disease)   . Coronary artery disease 2002    CABG x 6. Cath 5/11- med Rx  . Hypertension   . Peripheral vascular disease 12/12    LSFA PTA  . Anemia   . Hyperlipidemia   . Chronic renal insufficiency, stage II (mild)     followed  by [facility]  . CAD (coronary artery disease) 2002; 2015    CABG x 6 2002, cath 2011- med Rx stent DES VG-Diag  . Hypothyroid     treated  . Obesity (BMI  35.0-39.9 without comorbidity)   . CHF (congestive heart failure)     "in 2002" (11/26/2012)  . Chronic bronchitis     "q year; in the winter"   . Type II diabetes mellitus   . History of blood transfusion 2002    "when I had OHS"  . Gout     "right big toe"  . Anginal pain   . CAD (coronary artery disease) of artery bypass graft; DES to VG-Diag 09/28/13 11/09/2013  . Myocardial infarction 2000; 2002; 2011  . Pneumonia     "3 times I think" (12/12/2014)  . Migraines     "couple times/year" (08/04/2013), (12/12/2014)  . Headache     "~ q week" (08/04/2013); "~ twice/month" (12/12/2014)  . Arthritis     "stiff fingers and knees"  (08/04/2013), (12/12/2014)    Past Surgical History  Procedure Laterality Date  . Cholecystectomy  1982  . Cesarean section  1978; 1980  . Tubal ligation  1980  . Breast cyst excision Right 1970's  . Coronary artery bypass graft  11/20/2000    x6 LIMA to distal LAD, svg to first diag, svg to ramus intermediate branch and swquential SVG to cir marginal branch, SVG to posterior descending coronary and sequential SVG to first right posterolateral branch  . Peripheral arterial stent graft Left     SFA/notes 04/07/2011 (11/30/2012)  . Nm myocar perf wall motion  08/27/2004    negative  . Appendectomy  1980  . Abdominal aortagram N/A 04/05/2011    Procedure: ABDOMINAL AORTAGRAM;  Surgeon: Lorretta Harp, MD;  Location: [facility] CATH LAB;  Service: Cardiovascular;  Laterality: N/A;  . Renal angiogram N/A 04/05/2011    Procedure: RENAL ANGIOGRAM;  Surgeon: Lorretta Harp, MD;  Location: [facility] CATH LAB;  Service: Cardiovascular;  Laterality: N/A;  . Lower extremity angiogram  12/01/2012    Procedure: LOWER EXTREMITY ANGIOGRAM;  Surgeon: Lorretta Harp, MD;  Location: [facility] CATH LAB;  Service: Cardiovascular;;  . Percutaneous stent intervention Left 12/01/2012    Procedure: PERCUTANEOUS STENT INTERVENTION;  Surgeon: Lorretta Harp, MD;  Location: [facility] CATH LAB;  Service: Cardiovascular;  Laterality: Left;  Left SFA  . Left heart catheterization with coronary/graft angiogram N/A 09/28/2013    Procedure: LEFT HEART CATHETERIZATION WITH Beatrix Fetters;  Surgeon: Peter M Martinique, MD;  Location: [facility] CATH LAB;  Service: Cardiovascular;  Laterality: N/A;  . Cardiac catheterization  2002  . Coronary angioplasty with stent placement  2004; 2012    "I have 2 stents" (08/04/2013)  . Coronary angioplasty with stent placement  09/28/13    PTCA/ DES Xience stent to VG-Diag   . Cardiac catheterization N/A 12/12/2014    Procedure: Left Heart Cath and Cors/Grafts Angiography;  Surgeon: Peter M Martinique, MD;  Location: [place] CV LAB;  Service: Cardiovascular;  Laterality: N/A;  . Cardiac catheterization N/A 12/12/2014    Procedure: Coronary Stent Intervention;  Surgeon: Peter M Martinique, MD;  Location: [place] CV LAB;  Service: Cardiovascular;  Laterality: N/A;     Current Outpatient Prescriptions  Medication Sig Dispense Refill  . acetaminophen-codeine (TYLENOL #3) 300-30 MG per tablet Take 1 tablet by mouth every 4 (four) hours as needed for moderate pain. 60 tablet 0  . albuterol (PROVENTIL HFA;VENTOLIN HFA) 108 (90 BASE) MCG/ACT inhaler Inhale 2 puffs into the lungs every 6 (six) hours as needed for wheezing or shortness of breath. 3 Inhaler 3  . allopurinol (ZYLOPRIM) 300 MG tablet Take 1 tablet (300 mg  total) by mouth daily. 90 tablet 3  . amLODipine (NORVASC) 10 MG tablet Take 1 tablet (10 mg total) by mouth daily. 30 tablet 11  . aspirin 81 MG chewable tablet Chew 1 tablet (81 mg total) by mouth daily.    [name] atorvastatin (LIPITOR) 40 MG tablet Take 1 tablet (40 mg total) by mouth daily. 90 tablet 3  . cloNIDine (CATAPRES) 0.2 MG tablet Take 1 tablet (0.2 mg total) by mouth 2 (two) times daily. 180 tablet 3  . clopidogrel (PLAVIX) 75 MG tablet Take 1 tablet (75 mg total) by mouth daily. 90 tablet 2  . cyclobenzaprine (FLEXERIL) 10 MG tablet Take 1 tablet (10 mg total) by mouth 3 (three) times daily as needed for muscle spasms. 60 tablet 3  . doxazosin (CARDURA) 2 MG tablet Take 1 tablet (2 mg total) by mouth at bedtime. 30 tablet 11  . fluticasone (FLONASE) 50 MCG/ACT nasal spray Place 2 sprays into both nostrils daily as needed for allergies or rhinitis. 16 g 3  . furosemide (LASIX) 40 MG tablet Take 1 tablet (40 mg total) by mouth daily. 30 tablet 11  . gabapentin (NEURONTIN) 400 MG capsule Take 1 capsule (400 mg total) by mouth 3 (three) times daily. 270 capsule 3  . insulin aspart (NOVOLOG FLEXPEN) 100 UNIT/ML FlexPen Inject 10-18 Units into the skin 3 (three) times daily with meals. 15 mL 5  .  Insulin Glargine (TOUJEO SOLOSTAR) 300 UNIT/ML SOPN Inject 40 Units into the skin at bedtime. 6 pen 1  . Insulin Pen Needle 32G X 4 MM MISC Use to inject insulin daily as instructed. 150 each 11  . Insulin Syringes, Disposable, (B-D INSULIN SYRINGE 1CC) U-100 1 ML MISC Use to inject insulin 4 times daily as instructed. 160 each 4  . isosorbide mononitrate (IMDUR) 60 MG 24 hr tablet Take 1.5 tablets (90 mg total) by mouth daily. 90 tablet 5  . levothyroxine (SYNTHROID, LEVOTHROID) 25 MCG tablet take 1 tablet by mouth every morning BEFORE BREAKFAST 30 tablet 2  . loratadine (CLARITIN) 10 MG tablet Take 1 tablet (10 mg total) by mouth daily as needed for allergies. 30 tablet 11  . Menthol-Camphor (TIGER BALM ARTHRITIS RUB) 11-11 % CREA Apply 1 application topically 2 (two) times daily.    . metoprolol succinate (TOPROL-XL) 50 MG 24 hr tablet Take 3 tablets (150 mg total) by mouth daily. Take with or immediately following a meal. 90 tablet 5  . nitroGLYCERIN (NITROSTAT) 0.4 MG SL tablet Place 1 tablet (0.4 mg total) under the tongue every 5 (five) minutes as needed. For chest 30 tablet 6  . traZODone (DESYREL) 50 MG tablet Take 0.5 tablets (25 mg total) by mouth at bedtime. 45 tablet 3  . ZETIA 10 MG tablet take 1 tablet by mouth once daily 90 tablet 2   No current facility-administered medications for this visit.    Allergies:   Hydralazine; Penicillins cross reactors; and Adhesive    Social History:  The patient  reports that she quit smoking about 14 years ago. Her smoking use included Cigarettes. She has a 25 pack-year smoking history. She has never used smokeless tobacco. She reports that she drinks alcohol. She reports that she does not use illicit drugs.   Family History:  The patient's family history includes Diabetes in her mother and sister; Hyperlipidemia in her sister; Hypertension in her brother, father, mother, and sister; Stroke in her mother.    ROS:  Please see the history of  present  illness.    Otherwise, review of systems positive for swelling in ankles intermittently, usually a little worse on right side. No angina or dyspnea.   All other systems are reviewed and negative.    PHYSICAL EXAM: VS:  BP 146/82 mmHg  Pulse 66  Ht 5\' 10"  (1.778 m)  Wt 236 lb (107.049 kg)  BMI 33.86 kg/m2  LMP 10/13/2014 (Exact Date) , BMI Body mass index is 33.86 kg/(m^2).  General: Alert, oriented x3, no distress. Hirsutism Head: no evidence of trauma, PERRL, EOMI, no exophtalmos or lid lag, no myxedema, no xanthelasma; normal ears, nose and oropharynx Neck: normal jugular venous pulsations and no hepatojugular reflux; brisk carotid pulses without delay and no carotid bruits Chest: clear to auscultation, no signs of consolidation by percussion or palpation, normal fremitus, symmetrical and full respiratory excursions Cardiovascular: normal position and quality of the apical impulse, regular rhythm, normal first and second heart sounds, no murmurs, rubs or gallops Abdomen: no tenderness or distention, no masses by palpation, no abnormal pulsatility or arterial bruits, normal bowel sounds, no hepatosplenomegaly Extremities: no clubbing, cyanosis or edema; 2+ radial, ulnar and brachial pulses bilaterally; 2+ right femoral, posterior tibial and dorsalis pedis pulses; 2+ left femoral, posterior tibial and dorsalis pedis pulses; no subclavian or femoral bruits Neurological: grossly nonfocal Psych: euthymic mood, full affect   EKG:  EKG is ordered today. The ekg ordered today demonstrates NSR, LAA, nonspecific T wave changes, QTC 469 ms.   Recent Labs: 01/25/2014: Pro B Natriuretic peptide (BNP) 1369.0* 12/06/2014: ALT 20 12/14/2014: TSH 2.089 12/15/2014: BUN 19; Creatinine, Ser 1.61*; Hemoglobin 11.5*; Platelets 194; Potassium 3.6; Sodium 138    Lipid Panel    Component Value Date/Time   CHOL 180 08/04/2013 0430   TRIG 230* 08/04/2013 0430   HDL 35* 08/04/2013 0430   CHOLHDL  5.1 08/04/2013 0430   VLDL 46* 08/04/2013 0430   LDLCALC 99 08/04/2013 0430      Wt Readings from Last 3 Encounters:  01/12/15 236 lb (107.049 kg)  12/27/14 235 lb (106.595 kg)  12/14/14 234 lb 12.6 oz (106.5 kg)      Other studies Reviewed: Additional studies/ records that were reviewed today include: cath images.   ASSESSMENT AND PLAN:  1. Acute on chronic systolic and diastolic heart failure, resolved, with recent decrease in left ventricular systolic function by echo. Restart ACEi, titrate gradually as tolerated. Recheck LVEF in several weeks. Discussed sodium restriction and daily weight monitoring.  2. Coronary disease status post bypass surgery, recent drug eluting stent to SVG-ramus and previous DES to SVG to diagonal roughly one year ago. She is still taking aspirin and clopidogrel and is on a high-dose statin.  3. Insulin requiring diabetes mellitus complicated by diabetic nephropathy/CKD stage 2.   4. Hyperlipidemia. Target LDL should be less than 70. Recheck labs.   5. HTN. If renal function allows titrate ACEi to max and replace clonidine.    Current medicines are reviewed at length with the patient today.  The patient does not have concerns regarding medicines.  The following changes have been made:  Lisinopril 10 mg daily added.  Labs/ tests ordered today include:   Orders Placed This Encounter  Procedures  . Lipid panel  . EKG 12-Lead    Patient Instructions  Your physician recommends that you weigh, daily, at the same time every day, and in the same amount of clothing. Please record your daily weights on the handout provided and bring it to your next appointment.  Your physician recommends that you return for lab work in: FASTING LAST WEEK OF December AT [facility]   Dr. Sallyanne Kuster recommends that you schedule a follow-up appointment in: 6 months     Low-Sodium Eating Plan Sodium raises blood pressure and causes water to be held in the body.  Getting less sodium from food will help lower your blood pressure, reduce any swelling, and protect your heart, liver, and kidneys. We get sodium by adding salt (sodium chloride) to food. Most of our sodium comes from canned, boxed, and frozen foods. Restaurant foods, fast foods, and pizza are also very high in sodium. Even if you take medicine to lower your blood pressure or to reduce fluid in your body, getting less sodium from your food is important. WHAT IS MY PLAN? Most people should limit their sodium intake to 2,300 mg a day. Your health care provider recommends that you limit your sodium intake to ___2000_______ a day.  WHAT DO I NEED TO KNOW ABOUT THIS EATING PLAN? For the low-sodium eating plan, you will follow these general guidelines:  Choose foods with a % Daily Value for sodium of less than 5% (as listed on the food label).   Use salt-free seasonings or herbs instead of table salt or sea salt.   Check with your health care provider or pharmacist before using salt substitutes.   Eat fresh foods.  Eat more vegetables and fruits.  Limit canned vegetables. If you do use them, rinse them well to decrease the sodium.   Limit cheese to 1 oz (28 g) per day.   Eat lower-sodium products, often labeled as "lower sodium" or "no salt added."  Avoid foods that contain monosodium glutamate (MSG). MSG is sometimes added to [place] food and some canned foods.  Check food labels (Nutrition Facts labels) on foods to learn how much sodium is in one serving.  Eat more home-cooked food and less restaurant, buffet, and fast food.  When eating at a restaurant, ask that your food be prepared with less salt or none, if possible.  HOW DO I READ FOOD LABELS FOR SODIUM INFORMATION? The Nutrition Facts label lists the amount of sodium in one serving of the food. If you eat more than one serving, you must multiply the listed amount of sodium by the number of servings. Food labels may also  identify foods as:  Sodium free--Less than 5 mg in a serving.  Very low sodium--35 mg or less in a serving.  Low sodium--140 mg or less in a serving.  Light in sodium--50% less sodium in a serving. For example, if a food that usually has 300 mg of sodium is changed to become light in sodium, it will have 150 mg of sodium.  Reduced sodium--25% less sodium in a serving. For example, if a food that usually has 400 mg of sodium is changed to reduced sodium, it will have 300 mg of sodium. WHAT FOODS CAN I EAT? Grains Low-sodium cereals, including oats, puffed wheat and rice, and shredded wheat cereals. Low-sodium crackers. Unsalted rice and pasta. Lower-sodium bread.  Vegetables Frozen or fresh vegetables. Low-sodium or reduced-sodium canned vegetables. Low-sodium or reduced-sodium tomato sauce and paste. Low-sodium or reduced-sodium tomato and vegetable juices.  Fruits Fresh, frozen, and canned fruit. Fruit juice.  Meat and Other Protein Products Low-sodium canned tuna and salmon. Fresh or frozen meat, poultry, seafood, and fish. Lamb. Unsalted nuts. Dried beans, peas, and lentils without added salt. Unsalted canned beans. Homemade soups without salt. Eggs.  Dairy Milk. Soy milk. Ricotta cheese. Low-sodium or reduced-sodium cheeses. Yogurt.  Condiments Fresh and dried herbs and spices. Salt-free seasonings. Onion and garlic powders. Low-sodium varieties of mustard and ketchup. Lemon juice.  Fats and Oils Reduced-sodium salad dressings. Unsalted butter.  Other Unsalted popcorn and pretzels.  The items listed above may not be a complete list of recommended foods or beverages. Contact your dietitian for more options. WHAT FOODS ARE NOT RECOMMENDED? Grains Instant hot cereals. Bread stuffing, pancake, and biscuit mixes. Croutons. Seasoned rice or pasta mixes. Noodle soup cups. Boxed or frozen macaroni and cheese. Self-rising flour. Regular salted crackers. Vegetables Regular  canned vegetables. Regular canned tomato sauce and paste. Regular tomato and vegetable juices. Frozen vegetables in sauces. Salted french fries. Olives. Angie Fava. Relishes. Sauerkraut. Salsa. Meat and Other Protein Products Salted, canned, smoked, spiced, or pickled meats, seafood, or fish. Bacon, ham, sausage, hot dogs, corned beef, chipped beef, and packaged luncheon meats. Salt pork. Jerky. Pickled herring. Anchovies, regular canned tuna, and sardines. Salted nuts. Dairy Processed cheese and cheese spreads. Cheese curds. Blue cheese and cottage cheese. Buttermilk.  Condiments Onion and garlic salt, seasoned salt, table salt, and sea salt. Canned and packaged gravies. Worcestershire sauce. Tartar sauce. Barbecue sauce. Teriyaki sauce. Soy sauce, including reduced sodium. Steak sauce. Fish sauce. Oyster sauce. Cocktail sauce. Horseradish. Regular ketchup and mustard. Meat flavorings and tenderizers. Bouillon cubes. Hot sauce. Tabasco sauce. Marinades. Taco seasonings. Relishes. Fats and Oils Regular salad dressings. Salted butter. Margarine. Ghee. Bacon fat.  Other Potato and tortilla chips. Corn chips and puffs. Salted popcorn and pretzels. Canned or dried soups. Pizza. Frozen entrees and pot pies.  The items listed above may not be a complete list of foods and beverages to avoid. Contact your dietitian for more information. Document Released: 09/21/2001 Document Revised: 04/06/2013 Document Reviewed: 02/03/2013 [facility] Patient Information 2015 [place], [place]. This information is not intended to replace advice given to you by your health care provider. Make sure you discuss any questions you have with your health care provider.       Mikael Spray, MD  01/14/2015 3:38 PM    Sanda Klein, MD, [facility] [phone] office [phone] pager

## 2015-01-12 NOTE — Patient Instructions (Addendum)
Your physician recommends that you weigh, daily, at the same time every day, and in the same amount of clothing. Please record your daily weights on the handout provided and bring it to your next appointment.  Your physician recommends that you return for lab work in: FASTING LAST WEEK OF December AT [facility]   Dr. Sallyanne Kuster recommends that you schedule a follow-up appointment in: 6 months     Low-Sodium Eating Plan Sodium raises blood pressure and causes water to be held in the body. Getting less sodium from food will help lower your blood pressure, reduce any swelling, and protect your heart, liver, and kidneys. We get sodium by adding salt (sodium chloride) to food. Most of our sodium comes from canned, boxed, and frozen foods. Restaurant foods, fast foods, and pizza are also very high in sodium. Even if you take medicine to lower your blood pressure or to reduce fluid in your body, getting less sodium from your food is important. WHAT IS MY PLAN? Most people should limit their sodium intake to 2,300 mg a day. Your health care provider recommends that you limit your sodium intake to ___2000_______ a day.  WHAT DO I NEED TO KNOW ABOUT THIS EATING PLAN? For the low-sodium eating plan, you will follow these general guidelines:  Choose foods with a % Daily Value for sodium of less than 5% (as listed on the food label).   Use salt-free seasonings or herbs instead of table salt or sea salt.   Check with your health care provider or pharmacist before using salt substitutes.   Eat fresh foods.  Eat more vegetables and fruits.  Limit canned vegetables. If you do use them, rinse them well to decrease the sodium.   Limit cheese to 1 oz (28 g) per day.   Eat lower-sodium products, often labeled as "lower sodium" or "no salt added."  Avoid foods that contain monosodium glutamate (MSG). MSG is sometimes added to [place] food and some canned foods.  Check food labels (Nutrition Facts  labels) on foods to learn how much sodium is in one serving.  Eat more home-cooked food and less restaurant, buffet, and fast food.  When eating at a restaurant, ask that your food be prepared with less salt or none, if possible.  HOW DO I READ FOOD LABELS FOR SODIUM INFORMATION? The Nutrition Facts label lists the amount of sodium in one serving of the food. If you eat more than one serving, you must multiply the listed amount of sodium by the number of servings. Food labels may also identify foods as:  Sodium free--Less than 5 mg in a serving.  Very low sodium--35 mg or less in a serving.  Low sodium--140 mg or less in a serving.  Light in sodium--50% less sodium in a serving. For example, if a food that usually has 300 mg of sodium is changed to become light in sodium, it will have 150 mg of sodium.  Reduced sodium--25% less sodium in a serving. For example, if a food that usually has 400 mg of sodium is changed to reduced sodium, it will have 300 mg of sodium. WHAT FOODS CAN I EAT? Grains Low-sodium cereals, including oats, puffed wheat and rice, and shredded wheat cereals. Low-sodium crackers. Unsalted rice and pasta. Lower-sodium bread.  Vegetables Frozen or fresh vegetables. Low-sodium or reduced-sodium canned vegetables. Low-sodium or reduced-sodium tomato sauce and paste. Low-sodium or reduced-sodium tomato and vegetable juices.  Fruits Fresh, frozen, and canned fruit. Fruit juice.  Meat and  Other Protein Products Low-sodium canned tuna and salmon. Fresh or frozen meat, poultry, seafood, and fish. Lamb. Unsalted nuts. Dried beans, peas, and lentils without added salt. Unsalted canned beans. Homemade soups without salt. Eggs.  Dairy Milk. Soy milk. Ricotta cheese. Low-sodium or reduced-sodium cheeses. Yogurt.  Condiments Fresh and dried herbs and spices. Salt-free seasonings. Onion and garlic powders. Low-sodium varieties of mustard and ketchup. Lemon juice.  Fats and  Oils Reduced-sodium salad dressings. Unsalted butter.  Other Unsalted popcorn and pretzels.  The items listed above may not be a complete list of recommended foods or beverages. Contact your dietitian for more options. WHAT FOODS ARE NOT RECOMMENDED? Grains Instant hot cereals. Bread stuffing, pancake, and biscuit mixes. Croutons. Seasoned rice or pasta mixes. Noodle soup cups. Boxed or frozen macaroni and cheese. Self-rising flour. Regular salted crackers. Vegetables Regular canned vegetables. Regular canned tomato sauce and paste. Regular tomato and vegetable juices. Frozen vegetables in sauces. Salted french fries. Olives. Angie Fava. Relishes. Sauerkraut. Salsa. Meat and Other Protein Products Salted, canned, smoked, spiced, or pickled meats, seafood, or fish. Bacon, ham, sausage, hot dogs, corned beef, chipped beef, and packaged luncheon meats. Salt pork. Jerky. Pickled herring. Anchovies, regular canned tuna, and sardines. Salted nuts. Dairy Processed cheese and cheese spreads. Cheese curds. Blue cheese and cottage cheese. Buttermilk.  Condiments Onion and garlic salt, seasoned salt, table salt, and sea salt. Canned and packaged gravies. Worcestershire sauce. Tartar sauce. Barbecue sauce. Teriyaki sauce. Soy sauce, including reduced sodium. Steak sauce. Fish sauce. Oyster sauce. Cocktail sauce. Horseradish. Regular ketchup and mustard. Meat flavorings and tenderizers. Bouillon cubes. Hot sauce. Tabasco sauce. Marinades. Taco seasonings. Relishes. Fats and Oils Regular salad dressings. Salted butter. Margarine. Ghee. Bacon fat.  Other Potato and tortilla chips. Corn chips and puffs. Salted popcorn and pretzels. Canned or dried soups. Pizza. Frozen entrees and pot pies.  The items listed above may not be a complete list of foods and beverages to avoid. Contact your dietitian for more information. Document Released: 09/21/2001 Document Revised: 04/06/2013 Document Reviewed:  02/03/2013 [facility] Patient Information 2015 [place], [place]. This information is not intended to replace advice given to you by your health care provider. Make sure you discuss any questions you have with your health care provider.

## 2015-01-14 MED ORDER — LISINOPRIL 10 MG PO TABS
10.0000 mg | ORAL_TABLET | Freq: Every day | ORAL | Status: DC
Start: 2015-01-14 — End: 2015-07-28

## 2015-01-16 ENCOUNTER — Telehealth: Payer: Self-pay | Admitting: *Deleted

## 2015-01-16 DIAGNOSIS — Z79899 Other long term (current) drug therapy: Secondary | ICD-10-CM

## 2015-01-16 NOTE — Telephone Encounter (Signed)
Patient notified to start lisinopril 10mg  daily and recheck bmet in 3-4 weeks.  Patient voiced understanding.  Lab order mailed to patient.

## 2015-01-16 NOTE — Telephone Encounter (Signed)
-----   Message from Sanda Klein, MD sent at 01/14/2015  3:44 PM EDT ----- Please start lisinopril 10 mg daily and check bmet in 3-4 weeks. I put the rx in. MCr

## 2015-01-17 LAB — HM DIABETES EYE EXAM

## 2015-01-24 ENCOUNTER — Other Ambulatory Visit: Payer: Self-pay | Admitting: Internal Medicine

## 2015-02-01 ENCOUNTER — Encounter (INDEPENDENT_AMBULATORY_CARE_PROVIDER_SITE_OTHER): Payer: Medicare Other | Admitting: Ophthalmology

## 2015-02-01 ENCOUNTER — Other Ambulatory Visit: Payer: Self-pay | Admitting: Cardiovascular Disease

## 2015-02-01 DIAGNOSIS — E11311 Type 2 diabetes mellitus with unspecified diabetic retinopathy with macular edema: Secondary | ICD-10-CM | POA: Diagnosis not present

## 2015-02-01 DIAGNOSIS — E113391 Type 2 diabetes mellitus with moderate nonproliferative diabetic retinopathy without macular edema, right eye: Secondary | ICD-10-CM

## 2015-02-01 DIAGNOSIS — H43813 Vitreous degeneration, bilateral: Secondary | ICD-10-CM | POA: Diagnosis not present

## 2015-02-01 DIAGNOSIS — H35033 Hypertensive retinopathy, bilateral: Secondary | ICD-10-CM

## 2015-02-01 DIAGNOSIS — E113313 Type 2 diabetes mellitus with moderate nonproliferative diabetic retinopathy with macular edema, bilateral: Secondary | ICD-10-CM | POA: Diagnosis not present

## 2015-02-01 DIAGNOSIS — I10 Essential (primary) hypertension: Secondary | ICD-10-CM

## 2015-02-02 ENCOUNTER — Other Ambulatory Visit: Payer: Self-pay | Admitting: Cardiology

## 2015-02-06 DIAGNOSIS — H2512 Age-related nuclear cataract, left eye: Secondary | ICD-10-CM | POA: Diagnosis not present

## 2015-02-06 DIAGNOSIS — H25812 Combined forms of age-related cataract, left eye: Secondary | ICD-10-CM | POA: Diagnosis not present

## 2015-02-07 DIAGNOSIS — H2512 Age-related nuclear cataract, left eye: Secondary | ICD-10-CM | POA: Diagnosis not present

## 2015-02-13 ENCOUNTER — Telehealth: Payer: Self-pay | Admitting: Internal Medicine

## 2015-02-13 NOTE — Telephone Encounter (Signed)
Patient ask if she could change her Novolog medication dosage back like it was.

## 2015-02-14 NOTE — Telephone Encounter (Signed)
Called pt and lvm advising her to return call to clarify why she needs the dosage changed.

## 2015-02-14 NOTE — Telephone Encounter (Signed)
Returned pt's call. Had to lvm, advised pt will return call in the AM.

## 2015-02-14 NOTE — Telephone Encounter (Signed)
Pt returning your call, regarding Novolog

## 2015-02-16 ENCOUNTER — Other Ambulatory Visit: Payer: Self-pay | Admitting: Internal Medicine

## 2015-02-16 ENCOUNTER — Telehealth: Payer: Self-pay | Admitting: *Deleted

## 2015-02-16 MED ORDER — INSULIN PEN NEEDLE 31G X 5 MM MISC: Status: DC

## 2015-02-16 NOTE — Telephone Encounter (Signed)
Refilled

## 2015-02-23 ENCOUNTER — Ambulatory Visit: Payer: Medicare Other | Attending: Internal Medicine | Admitting: Internal Medicine

## 2015-02-23 ENCOUNTER — Other Ambulatory Visit: Payer: Self-pay | Admitting: Internal Medicine

## 2015-02-23 ENCOUNTER — Other Ambulatory Visit: Payer: Self-pay | Admitting: Cardiology

## 2015-02-23 ENCOUNTER — Encounter: Payer: Self-pay | Admitting: Internal Medicine

## 2015-02-23 VITALS — BP 136/71 | HR 67 | Temp 97.8°F | Resp 20 | Ht 70.0 in | Wt 237.6 lb

## 2015-02-23 DIAGNOSIS — N182 Chronic kidney disease, stage 2 (mild): Secondary | ICD-10-CM | POA: Diagnosis not present

## 2015-02-23 DIAGNOSIS — Z794 Long term (current) use of insulin: Secondary | ICD-10-CM | POA: Insufficient documentation

## 2015-02-23 DIAGNOSIS — E1140 Type 2 diabetes mellitus with diabetic neuropathy, unspecified: Secondary | ICD-10-CM | POA: Diagnosis not present

## 2015-02-23 DIAGNOSIS — I10 Essential (primary) hypertension: Secondary | ICD-10-CM

## 2015-02-23 DIAGNOSIS — R21 Rash and other nonspecific skin eruption: Secondary | ICD-10-CM | POA: Diagnosis not present

## 2015-02-23 DIAGNOSIS — I129 Hypertensive chronic kidney disease with stage 1 through stage 4 chronic kidney disease, or unspecified chronic kidney disease: Secondary | ICD-10-CM | POA: Diagnosis not present

## 2015-02-23 DIAGNOSIS — E1122 Type 2 diabetes mellitus with diabetic chronic kidney disease: Secondary | ICD-10-CM

## 2015-02-23 DIAGNOSIS — I255 Ischemic cardiomyopathy: Secondary | ICD-10-CM

## 2015-02-23 LAB — GLUCOSE, POCT (MANUAL RESULT ENTRY): POC GLUCOSE: 214 mg/dL — AB (ref 70–99)

## 2015-02-23 LAB — POCT GLYCOSYLATED HEMOGLOBIN (HGB A1C): HEMOGLOBIN A1C: 6.4

## 2015-02-23 MED ORDER — TRIAMCINOLONE ACETONIDE 0.1 % EX CREA: 1.0000 "application " | TOPICAL_CREAM | Freq: Two times a day (BID) | CUTANEOUS | Status: DC

## 2015-02-23 MED ORDER — ACETAMINOPHEN-CODEINE #3 300-30 MG PO TABS: 1.0000 | ORAL_TABLET | ORAL | Status: DC | PRN

## 2015-02-23 NOTE — Progress Notes (Signed)
Patient ID: Brooke Stafford, female   DOB: Sep 06, 1955, 59 y.o.   MRN: [id number]   Brooke Stafford, is a 59 y.o. female  [id number]  [id number]  DOB - [birth year]  Chief Complaint  Patient presents with  . Follow-up        Subjective:   Brooke Stafford is a 59 y.o. female with history of acute on chronic systolic and diastolic heart failure with recent decrease in left ventricular systolic function and multiple comorbidities including hypertension and diabetes here today for a follow up visit. Patient is complaining mostly of headache since having bilateral cataract surgery, she has also had 3 surgeries in her eyes since the first, she also had a shunt in the right eye from what she was told is diabetic retinopathy. She is also complaining of excessive fatigue since starting lisinopril, she thinks she may be depressed but denied suicidal ideation or thought and she declined referral to psychiatrist or starting medication today. She was recently seen by cardiologist and started on lisinopril 10 mg tablet by mouth daily. Her blood sugar is well controlled, her insulin was recently reduced because of hypoglycemic episodes. Her blood pressure is also well controlled. She has no syncopal episode. Patient developed a dark spot on her left hand recently occasionally itchy, patient think this may be from yard work but may also be from fungal infection. Patient has No headache, No chest pain, No abdominal pain - No Nausea, No new weakness tingling or numbness, No Cough - SOB.  Background History: "She has a past medical history significant for severe type 2 diabetes mellitus requiring insulin therapy, systemic hypertension, hyperlipidemia, obesity, PVD S/P LSFA PTA initally in 2012 and again in 2014, coronary artery disease, S/P CABG x 6 in 2002. In July 2011 when she presented with sepsis and medication non compliance and had a NSTEMI from demand ischemia: Cath showed a patent LIMA to LAD, patent SVG  to diagonal, and patent sequential SVG to branches of the right coronary artery, chronic occlusion of SVG-ramus intermedius. She also has a mild ischemic cardiomyopathy with a left ventricular ejection fraction of 45% to 50% by echo 5/11. In May 2015 both echo and nuclear stress testing showed normal LV EF around 60%. June 2015 presented with crescendo angina and received a stent to the SVG to diagonal artery (3.5 x 23 mm Xience [place]). No change in other coronary/graft anatomy, persistent stenosis in the ramus intermedius artery left for medical therapy. Echo in July 2016 showed reduction in EF to 35-40 %, associated with heart failure symptoms."  Problem  Rash of Hands  Essential Hypertension, Benign    ALLERGIES: Allergies  Allergen Reactions  . Hydralazine Shortness Of Breath  . Penicillins Cross Reactors Hives    And high fever  . Adhesive [Tape] Rash    bruising    PAST MEDICAL HISTORY: Past Medical History  Diagnosis Date  . Asthma   . Anxiety   . GERD (gastroesophageal reflux disease)   . Coronary artery disease 2002    CABG x 6. Cath 5/11- med Rx  . Hypertension   . Peripheral vascular disease ([place]) 12/12    LSFA PTA  . Anemia   . Hyperlipidemia   . Chronic renal insufficiency, stage II (mild)     followed  by [facility]  . CAD (coronary artery disease) 2002; 2015    CABG x 6 2002, cath 2011- med Rx stent DES VG-Diag  . Hypothyroid     treated  . Obesity (  BMI 35.0-39.9 without comorbidity) ([place])   . CHF (congestive heart failure) ([place])     "in 2002" (11/26/2012)  . Chronic bronchitis ([place])     "q year; in the winter"   . Type II diabetes mellitus ([place])   . History of blood transfusion 2002    "when I had OHS"  . Gout     "right big toe"  . Anginal pain ([place])   . CAD (coronary artery disease) of artery bypass graft; DES to VG-Diag 09/28/13 11/09/2013  . Myocardial infarction ([place]) 2000; 2002; 2011  . Pneumonia     "3 times I think" (12/12/2014)  .  Migraines     "couple times/year" (08/04/2013), (12/12/2014)  . Headache     "~ q week" (08/04/2013); "~ twice/month" (12/12/2014)  . Arthritis     "stiff fingers and knees" (08/04/2013), (12/12/2014)    MEDICATIONS AT HOME: Prior to Admission medications   Medication Sig Start Date End Date Taking? Authorizing Provider  acetaminophen-codeine (TYLENOL #3) 300-30 MG tablet Take 1 tablet by mouth every 4 (four) hours as needed for moderate pain. 02/23/15  Yes Tresa Garter, MD  albuterol (PROVENTIL HFA;VENTOLIN HFA) 108 (90 BASE) MCG/ACT inhaler Inhale 2 puffs into the lungs every 6 (six) hours as needed for wheezing or shortness of breath. 08/11/14  Yes Tresa Garter, MD  allopurinol (ZYLOPRIM) 300 MG tablet Take 1 tablet (300 mg total) by mouth daily. 08/11/14  Yes Tresa Garter, MD  amLODipine (NORVASC) 10 MG tablet Take 1 tablet (10 mg total) by mouth daily. 12/15/14  Yes Erlene Quan, PA-C  aspirin 81 MG chewable tablet Chew 1 tablet (81 mg total) by mouth daily. 09/29/13  Yes Brittainy Erie Noe, PA-C  atorvastatin (LIPITOR) 40 MG tablet Take 1 tablet (40 mg total) by mouth daily. 06/23/14  Yes Tresa Garter, MD  cloNIDine (CATAPRES) 0.2 MG tablet Take 1 tablet (0.2 mg total) by mouth 2 (two) times daily. 08/11/14  Yes Tresa Garter, MD  clopidogrel (PLAVIX) 75 MG tablet Take 1 tablet (75 mg total) by mouth daily. 12/28/14  Yes Mihai Croitoru, MD  cyclobenzaprine (FLEXERIL) 10 MG tablet Take 1 tablet (10 mg total) by mouth 3 (three) times daily as needed for muscle spasms. 01/27/14  Yes Tresa Garter, MD  doxazosin (CARDURA) 2 MG tablet Take 1 tablet (2 mg total) by mouth at bedtime. 12/15/14  Yes Luke K Kilroy, PA-C  fluticasone (FLONASE) 50 MCG/ACT nasal spray Place 2 sprays into both nostrils daily as needed for allergies or rhinitis. 08/11/14  Yes Tresa Garter, MD  furosemide (LASIX) 40 MG tablet Take 1 tablet (40 mg total) by mouth daily. 12/15/14  Yes Erlene Quan, PA-C  gabapentin (NEURONTIN) 400 MG capsule take 1 capsule by mouth three times a day 02/16/15  Yes Delenn Ahn E Doreene Burke, MD  insulin aspart (NOVOLOG FLEXPEN) 100 UNIT/ML FlexPen Inject 10-18 Units into the skin 3 (three) times daily with meals. 12/27/14  Yes Philemon Kingdom, MD  Insulin Glargine (TOUJEO SOLOSTAR) 300 UNIT/ML SOPN Inject 40 Units into the skin at bedtime. 12/27/14  Yes Philemon Kingdom, MD  Insulin Pen Needle 31G X 5 MM MISC Use to inject insulin 4 times daily as instructed. 02/16/15  Yes Philemon Kingdom, MD  Insulin Syringes, Disposable, (B-D INSULIN SYRINGE 1CC) U-100 1 ML MISC Use to inject insulin 4 times daily as instructed. 02/22/14  Yes Philemon Kingdom, MD  isosorbide mononitrate (IMDUR) 60 MG 24 hr tablet Take 1.5  tablets (90 mg total) by mouth daily. 11/10/14  Yes Tresa Garter, MD  levothyroxine (SYNTHROID, LEVOTHROID) 25 MCG tablet take 1 tablet by mouth every morning BEFORE BREAKFAST 11/07/14  Yes Deepak Advani, MD  lisinopril (PRINIVIL,ZESTRIL) 10 MG tablet Take 1 tablet (10 mg total) by mouth daily. 01/14/15  Yes Mihai Croitoru, MD  loratadine (CLARITIN) 10 MG tablet Take 1 tablet (10 mg total) by mouth daily as needed for allergies. 12/15/14  Yes Luke K Kilroy, PA-C  Menthol-Camphor (TIGER BALM ARTHRITIS RUB) 11-11 % CREA Apply 1 application topically 2 (two) times daily.   Yes Historical Provider, MD  metoprolol succinate (TOPROL-XL) 50 MG 24 hr tablet Take 3 tablets (150 mg total) by mouth daily. Take with or immediately following a meal. 01/25/14  Yes Brett Canales, PA-C  nitroGLYCERIN (NITROSTAT) 0.4 MG SL tablet Place 1 tablet (0.4 mg total) under the tongue every 5 (five) minutes as needed. For chest 11/10/14  Yes Tresa Garter, MD  traZODone (DESYREL) 50 MG tablet Take 0.5 tablets (25 mg total) by mouth at bedtime. 05/16/14  Yes Tresa Garter, MD  ZETIA 10 MG tablet take 1 tablet by mouth once daily 08/22/14  Yes Brittainy Erie Noe, PA-C    triamcinolone cream (KENALOG) 0.1 % Apply 1 application topically 2 (two) times daily. 02/23/15   Tresa Garter, MD     Objective:   Filed Vitals:   02/23/15 1048  BP: 136/71  Pulse: 67  Temp: 97.8 F (36.6 C)  TempSrc: Oral  Resp: 20  Height: 5\' 10"  (1.778 m)  Weight: 237 lb 9.6 oz (107.775 kg)  SpO2: 96%    Exam General appearance : Awake, alert, not in any distress. Speech Clear. Not toxic looking, morbidly obese HEENT: Atraumatic and Normocephalic, pupils equally reactive to light and accomodation Neck: supple, no JVD. No cervical lymphadenopathy.  Chest: Midline healed surgical scar, Good air entry bilaterally, no added sounds  CVS: S1 S2 regular, no murmurs.  Abdomen: Bowel sounds present, Non tender and not distended with no gaurding, rigidity or rebound. Extremities: B/L Lower Ext shows no edema, both legs are warm to touch Neurology: Awake alert, and oriented X 3, CN II-XII intact, Non focal Skin:No Rash  Data Review Lab Results  Component Value Date   HGBA1C 6.40 02/23/2015   HGBA1C 6.30 11/10/2014   HGBA1C 6.5 08/09/2014     Assessment & Plan   1. Type 2 diabetes mellitus with stage 2 chronic kidney disease, unspecified long term insulin use status (HCC)  - POCT A1C - Microalbumin/Creatinine Ratio, Urine - Glucose (CBG) Aim for 30 minutes of exercise most days. Rethink what you drink. Water is great! Aim for 2-3 Carb Choices per meal (30-45 grams) +/- 1 either way  Aim for 0-15 Carbs per snack if hungry  Include protein in moderation with your meals and snacks  Consider reading food labels for Total Carbohydrate and Fat Grams of foods  Consider checking BG at alternate times per day  Continue taking medication as directed Be mindful about how much sugar you are adding to beverages and other foods. Fruit Punch - find one with no sugar  Measure and decrease portions of carbohydrate foods  Make your plate and don't go back for seconds   2.  Type 2 diabetes mellitus with diabetic neuropathy, with long-term current use of insulin (HCC)  - acetaminophen-codeine (TYLENOL #3) 300-30 MG tablet; Take 1 tablet by mouth every 4 (four) hours as needed for moderate pain.  Dispense: 60 tablet; Refill: 0  3. Rash of hands  - triamcinolone cream (KENALOG) 0.1 %; Apply 1 application topically 2 (two) times daily.  Dispense: 45 g; Refill: 0  4. Essential hypertension, benign  We have discussed target BP range and blood pressure goal. I have advised patient to check BP regularly and to call us back or report to clinic if the numbers are consistently higher than 140/90. We discussed the importance of compliance with medical therapy and DASH diet recommended, consequences of uncontrolled hypertension discussed.   - continue current BP medications  Patient have been counseled extensively about nutrition and exercise  Return in about 3 months (around 05/26/2015) for Hemoglobin A1C and Follow up, DM, Follow up Pain and comorbidities.  The patient was given clear instructions to go to ER or return to medical center if symptoms don't improve, worsen or new problems develop. The patient verbalized understanding. The patient was told to call to get lab results if they haven't heard anything in the next week.   This note has been created with Surveyor, quantity. Any transcriptional errors are unintentional.    Angelica Chessman, MD, [place], [place], [place], [place] and [place], [place]   02/23/2015, 11:52 AM

## 2015-02-23 NOTE — Patient Instructions (Signed)
Diabetes and Exercise Exercising regularly is important. It is not just about losing weight. It has many health benefits, such as:  Improving your overall fitness, flexibility, and endurance.  Increasing your bone density.  Helping with weight control.  Decreasing your body fat.  Increasing your muscle strength.  Reducing stress and tension.  Improving your overall health. People with diabetes who exercise gain additional benefits because exercise:  Reduces appetite.  Improves the body's use of blood sugar (glucose).  Helps lower or control blood glucose.  Decreases blood pressure.  Helps control blood lipids (such as cholesterol and triglycerides).  Improves the body's use of the hormone insulin by:  Increasing the body's insulin sensitivity.  Reducing the body's insulin needs.  Decreases the risk for heart disease because exercising:  Lowers cholesterol and triglycerides levels.  Increases the levels of good cholesterol (such as high-density lipoproteins [HDL]) in the body.  Lowers blood glucose levels. YOUR ACTIVITY PLAN  Choose an activity that you enjoy, and set realistic goals. To exercise safely, you should begin practicing any new physical activity slowly, and gradually increase the intensity of the exercise over time. Your health care provider or diabetes educator can help create an activity plan that works for you. General recommendations include:  Encouraging children to engage in at least 60 minutes of physical activity each day.  Stretching and performing strength training exercises, such as yoga or weight lifting, at least 2 times per week.  Performing a total of at least 150 minutes of moderate-intensity exercise each week, such as brisk walking or water aerobics.  Exercising at least 3 days per week, making sure you allow no more than 2 consecutive days to pass without exercising.  Avoiding long periods of inactivity (90 minutes or more). When you  have to spend an extended period of time sitting down, take frequent breaks to walk or stretch. RECOMMENDATIONS FOR EXERCISING WITH TYPE 1 OR TYPE 2 DIABETES   Check your blood glucose before exercising. If blood glucose levels are greater than 240 mg/dL, check for urine ketones. Do not exercise if ketones are present.  Avoid injecting insulin into areas of the body that are going to be exercised. For example, avoid injecting insulin into:  The arms when playing tennis.  The legs when jogging.  Keep a record of:  Food intake before and after you exercise.  Expected peak times of insulin action.  Blood glucose levels before and after you exercise.  The type and amount of exercise you have done.  Review your records with your health care provider. Your health care provider will help you to develop guidelines for adjusting food intake and insulin amounts before and after exercising.  If you take insulin or oral hypoglycemic agents, watch for signs and symptoms of hypoglycemia. They include:  Dizziness.  Shaking.  Sweating.  Chills.  Confusion.  Drink plenty of water while you exercise to prevent dehydration or heat stroke. Body water is lost during exercise and must be replaced.  Talk to your health care provider before starting an exercise program to make sure it is safe for you. Remember, almost any type of activity is better than none.   This information is not intended to replace advice given to you by your health care provider. Make sure you discuss any questions you have with your health care provider.   Document Released: 06/22/2003 Document Revised: 08/16/2014 Document Reviewed: 09/08/2012 Elsevier Interactive Patient Education 2016 Elsevier Inc. Basic Carbohydrate Counting for Diabetes Mellitus Carbohydrate counting   is a method for keeping track of the amount of carbohydrates you eat. Eating carbohydrates naturally increases the level of sugar (glucose) in your  blood, so it is important for you to know the amount that is okay for you to have in every meal. Carbohydrate counting helps keep the level of glucose in your blood within normal limits. The amount of carbohydrates allowed is different for every person. A dietitian can help you calculate the amount that is right for you. Once you know the amount of carbohydrates you can have, you can count the carbohydrates in the foods you want to eat. Carbohydrates are found in the following foods:  Grains, such as breads and cereals.  Dried beans and soy products.  Starchy vegetables, such as potatoes, peas, and corn.  Fruit and fruit juices.  Milk and yogurt.  Sweets and snack foods, such as cake, cookies, candy, chips, soft drinks, and fruit drinks. CARBOHYDRATE COUNTING There are two ways to count the carbohydrates in your food. You can use either of the methods or a combination of both. Reading the "Nutrition Facts" on Packaged Food The "Nutrition Facts" is an area that is included on the labels of almost all packaged food and beverages in the [place]. It includes the serving size of that food or beverage and information about the nutrients in each serving of the food, including the grams (g) of carbohydrate per serving.  Decide the number of servings of this food or beverage that you will be able to eat or drink. Multiply that number of servings by the number of grams of carbohydrate that is listed on the label for that serving. The total will be the amount of carbohydrates you will be having when you eat or drink this food or beverage. Learning Standard Serving Sizes of Food When you eat food that is not packaged or does not include "Nutrition Facts" on the label, you need to measure the servings in order to count the amount of carbohydrates.A serving of most carbohydrate-rich foods contains about 15 g of carbohydrates. The following list includes serving sizes of carbohydrate-rich foods that  provide 15 g ofcarbohydrate per serving:   1 slice of bread (1 oz) or 1 six-inch tortilla.    of a hamburger bun or English muffin.  4-6 crackers.   cup unsweetened dry cereal.    cup hot cereal.   cup rice or pasta.    cup mashed potatoes or  of a large baked potato.  1 cup fresh fruit or one small piece of fruit.    cup canned or frozen fruit or fruit juice.  1 cup milk.   cup plain fat-free yogurt or yogurt sweetened with artificial sweeteners.   cup cooked dried beans or starchy vegetable, such as peas, corn, or potatoes.  Decide the number of standard-size servings that you will eat. Multiply that number of servings by 15 (the grams of carbohydrates in that serving). For example, if you eat 2 cups of strawberries, you will have eaten 2 servings and 30 g of carbohydrates (2 servings x 15 g = 30 g). For foods such as soups and casseroles, in which more than one food is mixed in, you will need to count the carbohydrates in each food that is included. EXAMPLE OF CARBOHYDRATE COUNTING Sample Dinner  3 oz chicken breast.   cup of brown rice.   cup of corn.  1 cup milk.   1 cup strawberries with sugar-free whipped topping.  Carbohydrate Calculation Step   1: Identify the foods that contain carbohydrates:   Rice.   Corn.   Milk.   Strawberries. Step 2:Calculate the number of servings eaten of each:   2 servings of rice.   1 serving of corn.   1 serving of milk.   1 serving of strawberries. Step 3: Multiply each of those number of servings by 15 g:   2 servings of rice x 15 g = 30 g.   1 serving of corn x 15 g = 15 g.   1 serving of milk x 15 g = 15 g.   1 serving of strawberries x 15 g = 15 g. Step 4: Add together all of the amounts to find the total grams of carbohydrates eaten: 30 g + 15 g + 15 g + 15 g = 75 g.   This information is not intended to replace advice given to you by your health care provider. Make sure you  discuss any questions you have with your health care provider.   Document Released: 04/01/2005 Document Revised: 04/22/2014 Document Reviewed: 02/26/2013 Elsevier Interactive Patient Education [employer].

## 2015-02-23 NOTE — Progress Notes (Signed)
Patient here for 3 month F/U  Patient complains of pain as a headache beginning 40 minutes ago. Patient complains of pain being present over eyes and nose region. Described as aching.  Patient complains of black blotch in the middle of left hand being present for 2 months.  Patient correlates sadness with lisinopril which began in October.

## 2015-02-24 MED ORDER — ASPIRIN 81 MG PO CHEW: 81.0000 mg | CHEWABLE_TABLET | Freq: Every day | ORAL | Status: AC

## 2015-02-24 NOTE — Telephone Encounter (Signed)
Asa 81mg  refilled

## 2015-02-25 ENCOUNTER — Other Ambulatory Visit: Payer: Self-pay | Admitting: Internal Medicine

## 2015-03-01 ENCOUNTER — Encounter (INDEPENDENT_AMBULATORY_CARE_PROVIDER_SITE_OTHER): Payer: Medicare Other | Admitting: Ophthalmology

## 2015-03-01 DIAGNOSIS — E11311 Type 2 diabetes mellitus with unspecified diabetic retinopathy with macular edema: Secondary | ICD-10-CM

## 2015-03-01 DIAGNOSIS — H35033 Hypertensive retinopathy, bilateral: Secondary | ICD-10-CM

## 2015-03-01 DIAGNOSIS — H43813 Vitreous degeneration, bilateral: Secondary | ICD-10-CM | POA: Diagnosis not present

## 2015-03-01 DIAGNOSIS — I10 Essential (primary) hypertension: Secondary | ICD-10-CM | POA: Diagnosis not present

## 2015-03-01 DIAGNOSIS — E113311 Type 2 diabetes mellitus with moderate nonproliferative diabetic retinopathy with macular edema, right eye: Secondary | ICD-10-CM | POA: Diagnosis not present

## 2015-03-01 DIAGNOSIS — E113392 Type 2 diabetes mellitus with moderate nonproliferative diabetic retinopathy without macular edema, left eye: Secondary | ICD-10-CM | POA: Diagnosis not present

## 2015-03-08 ENCOUNTER — Other Ambulatory Visit: Payer: Self-pay | Admitting: *Deleted

## 2015-03-13 MED ORDER — INSULIN GLARGINE 300 UNIT/ML ~~LOC~~ SOPN: 40.0000 [IU] | PEN_INJECTOR | Freq: Every day | SUBCUTANEOUS | Status: DC

## 2015-03-20 LAB — HM DIABETES EYE EXAM

## 2015-03-22 ENCOUNTER — Encounter (INDEPENDENT_AMBULATORY_CARE_PROVIDER_SITE_OTHER): Payer: Medicare Other | Admitting: Ophthalmology

## 2015-03-22 ENCOUNTER — Encounter: Payer: Self-pay | Admitting: Internal Medicine

## 2015-03-22 DIAGNOSIS — E11311 Type 2 diabetes mellitus with unspecified diabetic retinopathy with macular edema: Secondary | ICD-10-CM

## 2015-03-22 DIAGNOSIS — I10 Essential (primary) hypertension: Secondary | ICD-10-CM | POA: Diagnosis not present

## 2015-03-22 DIAGNOSIS — H35033 Hypertensive retinopathy, bilateral: Secondary | ICD-10-CM

## 2015-03-22 DIAGNOSIS — E113313 Type 2 diabetes mellitus with moderate nonproliferative diabetic retinopathy with macular edema, bilateral: Secondary | ICD-10-CM

## 2015-03-22 DIAGNOSIS — H43813 Vitreous degeneration, bilateral: Secondary | ICD-10-CM | POA: Diagnosis not present

## 2015-03-27 ENCOUNTER — Other Ambulatory Visit: Payer: Self-pay | Admitting: *Deleted

## 2015-03-27 MED ORDER — LEVOTHYROXINE SODIUM 25 MCG PO TABS: ORAL_TABLET | ORAL | Status: DC

## 2015-03-28 ENCOUNTER — Encounter: Payer: Self-pay | Admitting: Internal Medicine

## 2015-03-28 ENCOUNTER — Ambulatory Visit (INDEPENDENT_AMBULATORY_CARE_PROVIDER_SITE_OTHER): Payer: Medicare Other | Admitting: Internal Medicine

## 2015-03-28 VITALS — BP 134/82 | HR 70 | Temp 98.4°F | Resp 12 | Wt 243.0 lb

## 2015-03-28 DIAGNOSIS — I255 Ischemic cardiomyopathy: Secondary | ICD-10-CM

## 2015-03-28 DIAGNOSIS — E039 Hypothyroidism, unspecified: Secondary | ICD-10-CM | POA: Diagnosis not present

## 2015-03-28 DIAGNOSIS — N182 Chronic kidney disease, stage 2 (mild): Secondary | ICD-10-CM | POA: Diagnosis not present

## 2015-03-28 DIAGNOSIS — E1122 Type 2 diabetes mellitus with diabetic chronic kidney disease: Secondary | ICD-10-CM

## 2015-03-28 MED ORDER — INSULIN ASPART 100 UNIT/ML FLEXPEN: 10.0000 [IU] | PEN_INJECTOR | Freq: Three times a day (TID) | SUBCUTANEOUS | Status: DC

## 2015-03-28 MED ORDER — INSULIN GLARGINE 300 UNIT/ML ~~LOC~~ SOPN: 40.0000 [IU] | PEN_INJECTOR | Freq: Every day | SUBCUTANEOUS | Status: DC

## 2015-03-28 NOTE — Patient Instructions (Signed)
Please continue: - Toujeo 40 units daily - Novolog: 10-15 units before breakfast If sugars before a meal are 80-150: take the whole amount of mealtime insulin with that meal, but not the Sliding scale If sugars are 60-80, take only half of the mealtime insulin and no sliding scale. If sugars <60, do not take any insulin with that meal. - NovoLog SSI: - 150- 165: + 1 unit  - 166- 180: + 2 units  - 181- 195: + 3 units  - 196- 210: + 4 units  - >210: + 5 units  Please return in 4 months with your sugar log.

## 2015-03-28 NOTE — Progress Notes (Signed)
Patient ID: Brooke Stafford, female   DOB: January 23, 1956, 59 y.o.   MRN: [id number]  HPI: Brooke Stafford is a 59 y.o.-year-old female, returning for f/u for DM2, dx 2006, insulin-dependent since 2012, uncontrolled, with complications (CKD, iCMP, CAD - s/p CABG x 6, 2002, PN). Last visit 3 mo ago.  Last hemoglobin A1c was: Lab Results  Component Value Date   HGBA1C 6.40 02/23/2015   HGBA1C 6.30 11/10/2014   HGBA1C 6.5 08/09/2014  She had a steroid inj for gout in 10/19/2013.  Pt is on a regimen of:  - Toujeo 40 units daily - Novolog: 10-15 units before breakfast If sugars before a meal are 80-150: take the whole amount of mealtime insulin with that meal, but not the Sliding scale If sugars are 60-80, take only half of the mealtime insulin and no sliding scale. If sugars <60, do not take any insulin with that meal. - NovoLog: - 150- 165: + 1 unit  - 166- 180: + 2 units  - 181- 195: + 3 units  - 196- 210: + 4 units  - >210: + 5 units We stopped Glimepiride 4 mg in am.  She stopped Metformin (abd.pain, CKD).  We stopped Tradjenta 5 mg daily.  Pt checks her sugars 3 a day and they are close to goal: - am: 134-194 >> 150-228 >> 120-182 >> 87-155, 176 >> 65-135, 161 >> 78-134, 146 - 2h after b'fast: 240-250 >> 197 >> n/c - before lunch: 152-190, 201 x1 >> 118, 180-262 >> 78, 151-192, 200s >> 77-174 >> 49's, 67-144 >> 60, 101-144. 164 - 2h after lunch: 160-170 >> 270 x1 > n/c - before dinner: 152-198 >> 173, 194-288, 352 >> 130-190, some 200s >> 73-151, 185 >> 71-160 >> 111-154, 178 - 2h after dinner: high 100s-215 >> 151, 159, 343 (cookies) >> n/c - bedtime: 176, 193, 223 (cookies) >> n/c + lows. Lowest sugar was 49 x1 >> 60x1; she has hypoglycemia awareness at 100.  Highest sugar was 200s >> 185 >> 164 >> 178.  Pt's meals are: - Breakfast: toast 2 slices - Lunch: sandwich, fruit - Dinner: meat, veggies, 1 starch - Snacks: 2: fruit or sandwich, diet Pepsi, 1/2 banana, PB  crackers  - She has CKD, last BUN/creatinine:  Lab Results  Component Value Date   BUN 19 12/15/2014   CREATININE 1.61* 12/15/2014  Decreased from 1.8. Not on ACEI.  - last set of lipids: Lab Results  Component Value Date   CHOL 180 08/04/2013   HDL 35* 08/04/2013   LDLCALC 99 08/04/2013   TRIG 230* 08/04/2013   CHOLHDL 5.1 08/04/2013  On Zetia and Lipitor. Takes 1 ASA a day (81 mg) - last eye exam was in 03/20/2015, prev. 10/01/2014. + background DR and severe macular edema. She has mild glaucoma.  - + numbness and tingling in her feet. She has PN >> takes gabapentin 400 mg 1-2x a day.   I reviewed pt's medications, allergies, PMH, social hx, family hx, and changes were documented in the history of present illness. Otherwise, unchanged from my initial visit note. Sister had a stroke recently >> pt stressed.  ROS: Constitutional: no weight gain, no fatigue, + hot flushes, + poor sleep Eyes: no blurry vision, no xerophthalmia ENT: no sore throat, no nodules palpated in throat, no dysphagia/odynophagia Cardiovascular: no CP/+ SOB/no palpitations/leg swelling Respiratory: no cough/+ SOB/no wheezing Gastrointestinal: no N/V/D/C, no heartburn Musculoskeletal: + muscle aches/+ joint aches Skin: no rash, no itching Neurological: no tremors/numbness/tingling/dizziness,  no HA  Past Medical History  Diagnosis Date  . Asthma   . Anxiety   . GERD (gastroesophageal reflux disease)   . Coronary artery disease 2002    CABG x 6. Cath 5/11- med Rx  . Hypertension   . Peripheral vascular disease ([place]) 12/12    LSFA PTA  . Anemia   . Hyperlipidemia   . Chronic renal insufficiency, stage II (mild)     followed  by [facility]  . CAD (coronary artery disease) 2002; 2015    CABG x 6 2002, cath 2011- med Rx stent DES VG-Diag  . Hypothyroid     treated  . Obesity (BMI 35.0-39.9 without comorbidity) ([place])   . CHF (congestive heart failure) ([place])     "in 2002" (11/26/2012)  .  Chronic bronchitis ([place])     "q year; in the winter"   . Type II diabetes mellitus ([place])   . History of blood transfusion 2002    "when I had OHS"  . Gout     "right big toe"  . Anginal pain ([place])   . CAD (coronary artery disease) of artery bypass graft; DES to VG-Diag 09/28/13 11/09/2013  . Myocardial infarction ([place]) 2000; 2002; 2011  . Pneumonia     "3 times I think" (12/12/2014)  . Migraines     "couple times/year" (08/04/2013), (12/12/2014)  . Headache     "~ q week" (08/04/2013); "~ twice/month" (12/12/2014)  . Arthritis     "stiff fingers and knees" (08/04/2013), (12/12/2014)   Past Surgical History  Procedure Laterality Date  . Cholecystectomy  1982  . Cesarean section  1978; 1980  . Tubal ligation  1980  . Breast cyst excision Right 1970's  . Coronary artery bypass graft  11/20/2000    x6 LIMA to distal LAD, svg to first diag, svg to ramus intermediate branch and swquential SVG to cir marginal branch, SVG to posterior descending coronary and sequential SVG to first right posterolateral branch  . Peripheral arterial stent graft Left     SFA/notes 04/07/2011 (11/30/2012)  . Nm myocar perf wall motion  08/27/2004    negative  . Appendectomy  1980  . Abdominal aortagram N/A 04/05/2011    Procedure: ABDOMINAL AORTAGRAM;  Surgeon: Lorretta Harp, MD;  Location: [facility] CATH LAB;  Service: Cardiovascular;  Laterality: N/A;  . Renal angiogram N/A 04/05/2011    Procedure: RENAL ANGIOGRAM;  Surgeon: Lorretta Harp, MD;  Location[facility] CATH LAB;  Service: Cardiovascular;  Laterality: N/A;  . Lower extremity angiogram  12/01/2012    Procedure: LOWER EXTREMITY ANGIOGRAM;  Surgeon: Lorretta Harp, MD;  Location: [facility] CATH LAB;  Service: Cardiovascular;;  . Percutaneous stent intervention Left 12/01/2012    Procedure: PERCUTANEOUS STENT INTERVENTION;  Surgeon: Lorretta Harp, MD;  Location: [facility] CATH LAB;  Service: Cardiovascular;  Laterality: Left;  Left SFA  . Left heart catheterization with  coronary/graft angiogram N/A 09/28/2013    Procedure: LEFT HEART CATHETERIZATION WITH Beatrix Fetters;  Surgeon: Peter M Martinique, MD;  Location: [facility] CATH LAB;  Service: Cardiovascular;  Laterality: N/A;  . Cardiac catheterization  2002  . Coronary angioplasty with stent placement  2004; 2012    "I have 2 stents" (08/04/2013)  . Coronary angioplasty with stent placement  09/28/13    PTCA/ DES Xience stent to VG-Diag   . Cardiac catheterization N/A 12/12/2014    Procedure: Left Heart Cath and Cors/Grafts Angiography;  Surgeon: Peter M Martinique, MD;  Location: [facility] INVASIVE CV  LAB;  Service: Cardiovascular;  Laterality: N/A;  . Cardiac catheterization N/A 12/12/2014    Procedure: Coronary Stent Intervention;  Surgeon: Peter M Martinique, MD;  Location: [place] CV LAB;  Service: Cardiovascular;  Laterality: N/A;   Social History   Social History  . Marital Status: Divorced    Spouse Name: N/A  . Number of Children: N/A  . Years of Education: N/A   Occupational History  . Not on file.   Social History Main Topics  . Smoking status: Former Smoker -- 1.00 packs/day for 25 years    Types: Cigarettes    Quit date: 04/04/2000  . Smokeless tobacco: Never Used  . Alcohol Use: Yes     Comment: 12/12/2014  "have a glass of red wine on my birthday q yr; that's it"  . Drug Use: No  . Sexual Activity: Not Currently    Birth Control/ Protection: Abstinence   Other Topics Concern  . Not on file   Social History Narrative   Current Outpatient Prescriptions on File Prior to Visit  Medication Sig Dispense Refill  . acetaminophen-codeine (TYLENOL #3) 300-30 MG tablet Take 1 tablet by mouth every 4 (four) hours as needed for moderate pain. 60 tablet 0  . albuterol (PROVENTIL HFA;VENTOLIN HFA) 108 (90 BASE) MCG/ACT inhaler Inhale 2 puffs into the lungs every 6 (six) hours as needed for wheezing or shortness of breath. 3 Inhaler 3  . allopurinol (ZYLOPRIM) 300 MG tablet Take 1 tablet (300 mg total) by  mouth daily. 90 tablet 3  . amLODipine (NORVASC) 10 MG tablet Take 1 tablet (10 mg total) by mouth daily. 30 tablet 11  . aspirin 81 MG chewable tablet Chew 1 tablet (81 mg total) by mouth daily. 30 tablet 10  . atorvastatin (LIPITOR) 40 MG tablet Take 1 tablet (40 mg total) by mouth daily. 90 tablet 3  . cloNIDine (CATAPRES) 0.2 MG tablet Take 1 tablet (0.2 mg total) by mouth 2 (two) times daily. 180 tablet 3  . clopidogrel (PLAVIX) 75 MG tablet Take 1 tablet (75 mg total) by mouth daily. 90 tablet 2  . cyclobenzaprine (FLEXERIL) 10 MG tablet Take 1 tablet (10 mg total) by mouth 3 (three) times daily as needed for muscle spasms. 60 tablet 3  . doxazosin (CARDURA) 2 MG tablet Take 1 tablet (2 mg total) by mouth at bedtime. 30 tablet 11  . fluticasone (FLONASE) 50 MCG/ACT nasal spray Place 2 sprays into both nostrils daily as needed for allergies or rhinitis. 16 g 3  . furosemide (LASIX) 40 MG tablet Take 1 tablet (40 mg total) by mouth daily. 30 tablet 11  . gabapentin (NEURONTIN) 400 MG capsule take 1 capsule by mouth three times a day 270 capsule 3  . insulin aspart (NOVOLOG FLEXPEN) 100 UNIT/ML FlexPen Inject 10-18 Units into the skin 3 (three) times daily with meals. 15 mL 5  . Insulin Glargine (TOUJEO SOLOSTAR) 300 UNIT/ML SOPN Inject 40 Units into the skin at bedtime. 6 pen 1  . Insulin Pen Needle 31G X 5 MM MISC Use to inject insulin 4 times daily as instructed. 130 each 5  . Insulin Syringes, Disposable, (B-D INSULIN SYRINGE 1CC) U-100 1 ML MISC Use to inject insulin 4 times daily as instructed. 160 each 4  . isosorbide mononitrate (IMDUR) 60 MG 24 hr tablet Take 1.5 tablets (90 mg total) by mouth daily. 90 tablet 5  . levothyroxine (SYNTHROID, LEVOTHROID) 25 MCG tablet take 1 tablet by mouth every morning BEFORE BREAKFAST  30 tablet 2  . lisinopril (PRINIVIL,ZESTRIL) 10 MG tablet Take 1 tablet (10 mg total) by mouth daily. 90 tablet 3  . loratadine (CLARITIN) 10 MG tablet Take 1 tablet (10  mg total) by mouth daily as needed for allergies. 30 tablet 11  . Menthol-Camphor (TIGER BALM ARTHRITIS RUB) 11-11 % CREA Apply 1 application topically 2 (two) times daily.    . metoprolol succinate (TOPROL-XL) 50 MG 24 hr tablet Take 3 tablets (150 mg total) by mouth daily. Take with or immediately following a meal. 90 tablet 5  . nitroGLYCERIN (NITROSTAT) 0.4 MG SL tablet Place 1 tablet (0.4 mg total) under the tongue every 5 (five) minutes as needed. For chest 30 tablet 6  . traZODone (DESYREL) 50 MG tablet Take 0.5 tablets (25 mg total) by mouth at bedtime. 45 tablet 3  . triamcinolone cream (KENALOG) 0.1 % Apply 1 application topically 2 (two) times daily. 45 g 0  . ZETIA 10 MG tablet take 1 tablet by mouth once daily 90 tablet 2   No current facility-administered medications on file prior to visit.   Allergies  Allergen Reactions  . Hydralazine Shortness Of Breath  . Penicillins Cross Reactors Hives    And high fever  . Adhesive [Tape] Rash    bruising   Family History  Problem Relation Age of Onset  . Diabetes Mother   . Hypertension Mother   . Stroke Mother   . Hypertension Father   . Hypertension Brother   . Hypertension Sister   . Diabetes Sister   . Hyperlipidemia Sister    PE: BP 134/82 mmHg  Pulse 70  Temp(Src) 98.4 F (36.9 C) (Oral)  Resp 12  Wt 243 lb (110.224 kg)  SpO2 97%  LMP 10/13/2014 (Exact Date) Body mass index is 34.87 kg/(m^2).  Wt Readings from Last 3 Encounters:  03/28/15 243 lb (110.224 kg)  02/23/15 237 lb 9.6 oz (107.775 kg)  01/12/15 236 lb (107.049 kg)   Constitutional: obese, in NAD Eyes: PERRLA, EOMI, no exophthalmos ENT: moist mucous membranes, no thyromegaly, no cervical lymphadenopathy Cardiovascular: RRR, No MRG Respiratory: CTA B Gastrointestinal: abdomen soft, NT, ND, BS+ Musculoskeletal: no deformities, strength intact in all 4 Skin: moist, warm, no rashes, + hirsutism on chin Neurological: no tremor with outstretched hands,  DTR normal in all 4  ASSESSMENT: 1. DM2, insulin-dependent, uncontrolled, with complications - CKD - Dr. Marval Regal - iCMP, CAD - s/p CABG x 6, 2002, s/p stent 2006, s/p stent 09/2013 - Dr. Sallyanne Kuster - PN  PLAN:  1. Patient with long-standing, uncontrolled diabetes, on basal-bolus insulin regimen, with most sugars at goal. At last visit, sugars were in the 40s if she delayed a meal >> we reduced basal insulin and also max bolus. She only had 1 low:60 since last visit.  Patient Instructions  Please continue: - Toujeo 40 units daily - Novolog: 10-15 units before meals If sugars before a meal are 80-150: take the whole amount of mealtime insulin with that meal, but not the Sliding scale If sugars are 60-80, take only half of the mealtime insulin and no sliding scale. If sugars <60, do not take any insulin with that meal. - NovoLog SSI: - 150- 165: + 1 unit  - 166- 180: + 2 units  - 181- 195: + 3 units  - 196- 210: + 4 units  - >210: + 5 units  Please return in 4 months with your sugar log.   - continue checking sugars at different times  of the day - check 3 times a day, rotating checks. - up to date with yearly eye exams - reviewed last HbA1c = 6.4% (excellent) - refilled NovoLog and Toujeo (2 boxes of each) - will have a lipid panel this month by Dr Sallyanne Kuster - Return to clinic in 4 mo with sugar log

## 2015-03-29 ENCOUNTER — Encounter (INDEPENDENT_AMBULATORY_CARE_PROVIDER_SITE_OTHER): Payer: Medicare Other | Admitting: Ophthalmology

## 2015-03-30 ENCOUNTER — Other Ambulatory Visit: Payer: Self-pay | Admitting: Internal Medicine

## 2015-03-30 ENCOUNTER — Encounter: Payer: Self-pay | Admitting: Internal Medicine

## 2015-03-30 NOTE — Telephone Encounter (Signed)
Message was sent to intended recipient.

## 2015-03-31 ENCOUNTER — Other Ambulatory Visit: Payer: Self-pay

## 2015-03-31 ENCOUNTER — Other Ambulatory Visit: Payer: Self-pay | Admitting: Internal Medicine

## 2015-03-31 ENCOUNTER — Other Ambulatory Visit: Payer: Self-pay | Admitting: *Deleted

## 2015-03-31 DIAGNOSIS — Z794 Long term (current) use of insulin: Principal | ICD-10-CM

## 2015-03-31 DIAGNOSIS — E1140 Type 2 diabetes mellitus with diabetic neuropathy, unspecified: Secondary | ICD-10-CM

## 2015-03-31 MED ORDER — INSULIN ASPART 100 UNIT/ML FLEXPEN: PEN_INJECTOR | SUBCUTANEOUS | Status: DC

## 2015-03-31 MED ORDER — ACETAMINOPHEN-CODEINE #3 300-30 MG PO TABS: 1.0000 | ORAL_TABLET | ORAL | Status: DC | PRN

## 2015-03-31 NOTE — Progress Notes (Signed)
Tylenol No. 3 reorder for patient to pick up.  Brooke Stafford  03/31/2015

## 2015-04-06 NOTE — Telephone Encounter (Signed)
Pt. Called stating that she saw on My Chart that her prescription for Tylenol #3 was refilled and ready to pick up. The prescription is not in the front dest and prescription class order states no print.

## 2015-04-07 ENCOUNTER — Other Ambulatory Visit: Payer: Self-pay

## 2015-04-07 DIAGNOSIS — Z794 Long term (current) use of insulin: Principal | ICD-10-CM

## 2015-04-07 DIAGNOSIS — E1140 Type 2 diabetes mellitus with diabetic neuropathy, unspecified: Secondary | ICD-10-CM

## 2015-04-07 MED ORDER — ACETAMINOPHEN-CODEINE #3 300-30 MG PO TABS: 1.0000 | ORAL_TABLET | ORAL | Status: DC | PRN

## 2015-04-07 NOTE — Telephone Encounter (Signed)
Nurse called patient, reached voicemail. Left message for patient to call Polk Minor with [facility], at [phone]. Nurse spoke to Chari Manning about Tylenol 3 prescription not being printed. Per Mateo Flow, print prescription and she will sign. Prescription placed in book at front desk. Nurse called patient to make patient aware of prescription at front desk to be signed for.

## 2015-04-12 ENCOUNTER — Telehealth: Payer: Self-pay

## 2015-04-12 NOTE — Telephone Encounter (Signed)
Nurse called patient, reached voicemail. Left message for patient to call Sabrina Arriaga with [facility], at [phone]. Nurse called patient to make patient aware of Tylenol 3 prescription ready to be picked up at front desk.

## 2015-04-19 ENCOUNTER — Encounter (INDEPENDENT_AMBULATORY_CARE_PROVIDER_SITE_OTHER): Payer: Medicare Other | Admitting: Ophthalmology

## 2015-04-20 ENCOUNTER — Other Ambulatory Visit: Payer: Self-pay | Admitting: Cardiovascular Disease

## 2015-04-20 DIAGNOSIS — E785 Hyperlipidemia, unspecified: Secondary | ICD-10-CM | POA: Diagnosis not present

## 2015-04-21 LAB — LIPID PANEL W/O CHOL/HDL RATIO
CHOLESTEROL TOTAL: 144 mg/dL (ref 100–199)
HDL: 51 mg/dL (ref >39)
LDL Calculated: 63 mg/dL (ref 0–99)
TRIGLYCERIDES: 149 mg/dL (ref 0–149)
VLDL Cholesterol Cal: 30 mg/dL (ref 5–40)

## 2015-04-21 LAB — AMBIG ABBREV LP DEFAULT

## 2015-04-25 DIAGNOSIS — E113412 Type 2 diabetes mellitus with severe nonproliferative diabetic retinopathy with macular edema, left eye: Secondary | ICD-10-CM | POA: Diagnosis not present

## 2015-04-25 DIAGNOSIS — H3561 Retinal hemorrhage, right eye: Secondary | ICD-10-CM | POA: Diagnosis not present

## 2015-04-25 DIAGNOSIS — E113411 Type 2 diabetes mellitus with severe nonproliferative diabetic retinopathy with macular edema, right eye: Secondary | ICD-10-CM | POA: Diagnosis not present

## 2015-04-25 DIAGNOSIS — Z961 Presence of intraocular lens: Secondary | ICD-10-CM | POA: Diagnosis not present

## 2015-04-25 LAB — HM DIABETES EYE EXAM

## 2015-05-10 DIAGNOSIS — E113411 Type 2 diabetes mellitus with severe nonproliferative diabetic retinopathy with macular edema, right eye: Secondary | ICD-10-CM | POA: Diagnosis not present

## 2015-05-11 ENCOUNTER — Encounter (INDEPENDENT_AMBULATORY_CARE_PROVIDER_SITE_OTHER): Payer: Medicare Other | Admitting: Ophthalmology

## 2015-05-11 ENCOUNTER — Encounter: Payer: Self-pay | Admitting: Internal Medicine

## 2015-05-16 ENCOUNTER — Other Ambulatory Visit: Payer: Self-pay | Admitting: *Deleted

## 2015-05-16 DIAGNOSIS — E113411 Type 2 diabetes mellitus with severe nonproliferative diabetic retinopathy with macular edema, right eye: Secondary | ICD-10-CM | POA: Diagnosis not present

## 2015-05-16 DIAGNOSIS — I10 Essential (primary) hypertension: Secondary | ICD-10-CM

## 2015-05-16 DIAGNOSIS — E113412 Type 2 diabetes mellitus with severe nonproliferative diabetic retinopathy with macular edema, left eye: Secondary | ICD-10-CM | POA: Diagnosis not present

## 2015-05-16 MED ORDER — CLONIDINE HCL 0.2 MG PO TABS: 0.2000 mg | ORAL_TABLET | Freq: Two times a day (BID) | ORAL | Status: DC

## 2015-05-16 NOTE — Telephone Encounter (Signed)
Patient last OV 02/23/15. Patients pharmacy is requesting refill on Clonidine. Patients BP medication was refilled with 3 additional refills.

## 2015-05-31 DIAGNOSIS — E113411 Type 2 diabetes mellitus with severe nonproliferative diabetic retinopathy with macular edema, right eye: Secondary | ICD-10-CM | POA: Diagnosis not present

## 2015-06-01 ENCOUNTER — Other Ambulatory Visit: Payer: Self-pay | Admitting: Cardiology

## 2015-06-01 DIAGNOSIS — N183 Chronic kidney disease, stage 3 (moderate): Secondary | ICD-10-CM | POA: Diagnosis not present

## 2015-06-01 DIAGNOSIS — E039 Hypothyroidism, unspecified: Secondary | ICD-10-CM | POA: Diagnosis not present

## 2015-06-01 DIAGNOSIS — I2510 Atherosclerotic heart disease of native coronary artery without angina pectoris: Secondary | ICD-10-CM | POA: Diagnosis not present

## 2015-06-01 DIAGNOSIS — I129 Hypertensive chronic kidney disease with stage 1 through stage 4 chronic kidney disease, or unspecified chronic kidney disease: Secondary | ICD-10-CM | POA: Diagnosis not present

## 2015-06-01 DIAGNOSIS — E1129 Type 2 diabetes mellitus with other diabetic kidney complication: Secondary | ICD-10-CM | POA: Diagnosis not present

## 2015-06-01 DIAGNOSIS — E669 Obesity, unspecified: Secondary | ICD-10-CM | POA: Diagnosis not present

## 2015-06-01 NOTE — Telephone Encounter (Signed)
Rx request sent to pharmacy.  

## 2015-06-05 ENCOUNTER — Other Ambulatory Visit: Payer: Self-pay | Admitting: Cardiology

## 2015-06-06 NOTE — Telephone Encounter (Signed)
Rx(s) sent to pharmacy electronically.  

## 2015-06-14 DIAGNOSIS — E113412 Type 2 diabetes mellitus with severe nonproliferative diabetic retinopathy with macular edema, left eye: Secondary | ICD-10-CM | POA: Diagnosis not present

## 2015-06-16 ENCOUNTER — Other Ambulatory Visit: Payer: Self-pay | Admitting: *Deleted

## 2015-06-16 MED ORDER — ATORVASTATIN CALCIUM 40 MG PO TABS
40.0000 mg | ORAL_TABLET | Freq: Every day | ORAL | Status: DC
Start: 2015-06-16 — End: 2015-07-28

## 2015-07-24 ENCOUNTER — Telehealth: Payer: Self-pay | Admitting: Internal Medicine

## 2015-07-24 NOTE — Telephone Encounter (Signed)
[place] called to check on the status of the Prior Auth for Lidocaine please f/u at [phone] Ext. [phone]

## 2015-07-25 ENCOUNTER — Observation Stay (HOSPITAL_COMMUNITY)
Admission: EM | Admit: 2015-07-25 | Discharge: 2015-07-28 | Disposition: A | Discharge status: Home / Self Care | Payer: Medicare Other | Attending: Internal Medicine | Admitting: Internal Medicine

## 2015-07-25 ENCOUNTER — Emergency Department (HOSPITAL_COMMUNITY): Payer: Medicare Other

## 2015-07-25 DIAGNOSIS — J302 Other seasonal allergic rhinitis: Secondary | ICD-10-CM

## 2015-07-25 DIAGNOSIS — D649 Anemia, unspecified: Secondary | ICD-10-CM | POA: Diagnosis not present

## 2015-07-25 DIAGNOSIS — Z6836 Body mass index (BMI) 36.0-36.9, adult: Secondary | ICD-10-CM | POA: Insufficient documentation

## 2015-07-25 DIAGNOSIS — N182 Chronic kidney disease, stage 2 (mild): Secondary | ICD-10-CM

## 2015-07-25 DIAGNOSIS — R7989 Other specified abnormal findings of blood chemistry: Secondary | ICD-10-CM | POA: Diagnosis present

## 2015-07-25 DIAGNOSIS — E669 Obesity, unspecified: Secondary | ICD-10-CM | POA: Insufficient documentation

## 2015-07-25 DIAGNOSIS — Z87891 Personal history of nicotine dependence: Secondary | ICD-10-CM | POA: Diagnosis not present

## 2015-07-25 DIAGNOSIS — R0789 Other chest pain: Principal | ICD-10-CM | POA: Insufficient documentation

## 2015-07-25 DIAGNOSIS — I2510 Atherosclerotic heart disease of native coronary artery without angina pectoris: Secondary | ICD-10-CM

## 2015-07-25 DIAGNOSIS — Z7982 Long term (current) use of aspirin: Secondary | ICD-10-CM | POA: Diagnosis not present

## 2015-07-25 DIAGNOSIS — I200 Unstable angina: Secondary | ICD-10-CM

## 2015-07-25 DIAGNOSIS — Z955 Presence of coronary angioplasty implant and graft: Secondary | ICD-10-CM | POA: Diagnosis not present

## 2015-07-25 DIAGNOSIS — Z794 Long term (current) use of insulin: Secondary | ICD-10-CM | POA: Insufficient documentation

## 2015-07-25 DIAGNOSIS — N186 End stage renal disease: Secondary | ICD-10-CM

## 2015-07-25 DIAGNOSIS — I252 Old myocardial infarction: Secondary | ICD-10-CM | POA: Diagnosis not present

## 2015-07-25 DIAGNOSIS — I5042 Chronic combined systolic (congestive) and diastolic (congestive) heart failure: Secondary | ICD-10-CM

## 2015-07-25 DIAGNOSIS — E1165 Type 2 diabetes mellitus with hyperglycemia: Secondary | ICD-10-CM | POA: Diagnosis not present

## 2015-07-25 DIAGNOSIS — E118 Type 2 diabetes mellitus with unspecified complications: Secondary | ICD-10-CM

## 2015-07-25 DIAGNOSIS — E1140 Type 2 diabetes mellitus with diabetic neuropathy, unspecified: Secondary | ICD-10-CM | POA: Diagnosis not present

## 2015-07-25 DIAGNOSIS — L209 Atopic dermatitis, unspecified: Secondary | ICD-10-CM

## 2015-07-25 DIAGNOSIS — N183 Chronic kidney disease, stage 3 unspecified: Secondary | ICD-10-CM

## 2015-07-25 DIAGNOSIS — N179 Acute kidney failure, unspecified: Secondary | ICD-10-CM | POA: Diagnosis not present

## 2015-07-25 DIAGNOSIS — I130 Hypertensive heart and chronic kidney disease with heart failure and stage 1 through stage 4 chronic kidney disease, or unspecified chronic kidney disease: Secondary | ICD-10-CM | POA: Insufficient documentation

## 2015-07-25 DIAGNOSIS — R072 Precordial pain: Secondary | ICD-10-CM

## 2015-07-25 DIAGNOSIS — E039 Hypothyroidism, unspecified: Secondary | ICD-10-CM | POA: Diagnosis not present

## 2015-07-25 DIAGNOSIS — Z9861 Coronary angioplasty status: Secondary | ICD-10-CM

## 2015-07-25 DIAGNOSIS — Z1211 Encounter for screening for malignant neoplasm of colon: Secondary | ICD-10-CM

## 2015-07-25 DIAGNOSIS — Z1231 Encounter for screening mammogram for malignant neoplasm of breast: Secondary | ICD-10-CM

## 2015-07-25 DIAGNOSIS — E785 Hyperlipidemia, unspecified: Secondary | ICD-10-CM | POA: Diagnosis not present

## 2015-07-25 DIAGNOSIS — E1122 Type 2 diabetes mellitus with diabetic chronic kidney disease: Secondary | ICD-10-CM | POA: Diagnosis not present

## 2015-07-25 DIAGNOSIS — Z951 Presence of aortocoronary bypass graft: Secondary | ICD-10-CM

## 2015-07-25 DIAGNOSIS — R079 Chest pain, unspecified: Secondary | ICD-10-CM | POA: Diagnosis not present

## 2015-07-25 DIAGNOSIS — R21 Rash and other nonspecific skin eruption: Secondary | ICD-10-CM

## 2015-07-25 DIAGNOSIS — N189 Chronic kidney disease, unspecified: Secondary | ICD-10-CM

## 2015-07-25 DIAGNOSIS — IMO0002 Reserved for concepts with insufficient information to code with codable children: Secondary | ICD-10-CM

## 2015-07-25 DIAGNOSIS — Z7902 Long term (current) use of antithrombotics/antiplatelets: Secondary | ICD-10-CM | POA: Insufficient documentation

## 2015-07-25 DIAGNOSIS — I5041 Acute combined systolic (congestive) and diastolic (congestive) heart failure: Secondary | ICD-10-CM | POA: Diagnosis not present

## 2015-07-25 DIAGNOSIS — Z79899 Other long term (current) drug therapy: Secondary | ICD-10-CM | POA: Insufficient documentation

## 2015-07-25 DIAGNOSIS — J449 Chronic obstructive pulmonary disease, unspecified: Secondary | ICD-10-CM | POA: Insufficient documentation

## 2015-07-25 DIAGNOSIS — M109 Gout, unspecified: Secondary | ICD-10-CM

## 2015-07-25 DIAGNOSIS — I10 Essential (primary) hypertension: Secondary | ICD-10-CM | POA: Diagnosis present

## 2015-07-25 DIAGNOSIS — R05 Cough: Secondary | ICD-10-CM | POA: Diagnosis not present

## 2015-07-25 DIAGNOSIS — Z880 Allergy status to penicillin: Secondary | ICD-10-CM | POA: Insufficient documentation

## 2015-07-25 DIAGNOSIS — I739 Peripheral vascular disease, unspecified: Secondary | ICD-10-CM

## 2015-07-25 DIAGNOSIS — I209 Angina pectoris, unspecified: Secondary | ICD-10-CM

## 2015-07-25 DIAGNOSIS — R9439 Abnormal result of other cardiovascular function study: Secondary | ICD-10-CM

## 2015-07-25 DIAGNOSIS — I255 Ischemic cardiomyopathy: Secondary | ICD-10-CM

## 2015-07-25 LAB — CBC
HEMATOCRIT: 30.1 % — AB (ref 36.0–46.0)
HEMOGLOBIN: 10.4 g/dL — AB (ref 12.0–15.0)
MCH: 31.7 pg (ref 26.0–34.0)
MCHC: 34.6 g/dL (ref 30.0–36.0)
MCV: 91.8 fL (ref 78.0–100.0)
Platelets: 203 10*3/uL (ref 150–400)
RBC: 3.28 MIL/uL — ABNORMAL LOW (ref 3.87–5.11)
RDW: 14.1 % (ref 11.5–15.5)
WBC: 5.8 10*3/uL (ref 4.0–10.5)

## 2015-07-25 LAB — BASIC METABOLIC PANEL
ANION GAP: 10 (ref 5–15)
BUN: 25 mg/dL — AB (ref 6–20)
CO2: 27 mmol/L (ref 22–32)
Calcium: 9.6 mg/dL (ref 8.9–10.3)
Chloride: 107 mmol/L (ref 101–111)
Creatinine, Ser: 2.37 mg/dL — ABNORMAL HIGH (ref 0.44–1.00)
GFR calc Af Amer: 24 mL/min — ABNORMAL LOW (ref >60)
GFR, EST NON AFRICAN AMERICAN: 21 mL/min — AB (ref >60)
Glucose, Bld: 161 mg/dL — ABNORMAL HIGH (ref 65–99)
POTASSIUM: 3.9 mmol/L (ref 3.5–5.1)
SODIUM: 144 mmol/L (ref 135–145)

## 2015-07-25 LAB — URINALYSIS, ROUTINE W REFLEX MICROSCOPIC
BILIRUBIN URINE: NEGATIVE
Glucose, UA: 100 mg/dL — AB
KETONES UR: NEGATIVE mg/dL
Leukocytes, UA: NEGATIVE
Nitrite: NEGATIVE
PH: 5.5 (ref 5.0–8.0)
Protein, ur: >300 mg/dL — AB
SPECIFIC GRAVITY, URINE: 1.015 (ref 1.005–1.030)

## 2015-07-25 LAB — LIPID PANEL
Cholesterol: 142 mg/dL (ref 0–200)
HDL: 39 mg/dL — AB (ref >40)
LDL Cholesterol: 59 mg/dL (ref 0–99)
TRIGLYCERIDES: 221 mg/dL — AB (ref <150)
Total CHOL/HDL Ratio: 3.6 RATIO
VLDL: 44 mg/dL — ABNORMAL HIGH (ref 0–40)

## 2015-07-25 LAB — I-STAT TROPONIN, ED
TROPONIN I, POC: 0.02 ng/mL (ref 0.00–0.08)
Troponin i, poc: 0.01 ng/mL (ref 0.00–0.08)

## 2015-07-25 LAB — GLUCOSE, CAPILLARY: Glucose-Capillary: 213 mg/dL — ABNORMAL HIGH (ref 65–99)

## 2015-07-25 LAB — URINE MICROSCOPIC-ADD ON

## 2015-07-25 LAB — BRAIN NATRIURETIC PEPTIDE: B Natriuretic Peptide: 339.5 pg/mL — ABNORMAL HIGH (ref 0.0–100.0)

## 2015-07-25 LAB — TSH: TSH: 3.124 u[IU]/mL (ref 0.350–4.500)

## 2015-07-25 LAB — TROPONIN I: Troponin I: <0.03 ng/mL (ref <0.031)

## 2015-07-25 MED ORDER — INSULIN GLARGINE 100 UNIT/ML ~~LOC~~ SOLN
20.0000 [IU] | Freq: Every day | SUBCUTANEOUS | Status: DC
  Administered 2015-07-25 – 2015-07-26 (×2): 20 [IU] via SUBCUTANEOUS
  Filled 2015-07-25 (×3): qty 0.2

## 2015-07-25 MED ORDER — ALPRAZOLAM 0.25 MG PO TABS
0.2500 mg | ORAL_TABLET | Freq: Two times a day (BID) | ORAL | Status: DC | PRN
  Administered 2015-07-26 – 2015-07-28 (×4): 0.25 mg via ORAL
  Filled 2015-07-25 (×4): qty 1

## 2015-07-25 MED ORDER — ALLOPURINOL 300 MG PO TABS
300.0000 mg | ORAL_TABLET | Freq: Every day | ORAL | Status: DC
  Administered 2015-07-25 – 2015-07-28 (×4): 300 mg via ORAL
  Filled 2015-07-25 (×4): qty 1

## 2015-07-25 MED ORDER — CLOPIDOGREL BISULFATE 75 MG PO TABS
75.0000 mg | ORAL_TABLET | Freq: Every day | ORAL | Status: DC
  Administered 2015-07-25 – 2015-07-28 (×4): 75 mg via ORAL
  Filled 2015-07-25 (×4): qty 1

## 2015-07-25 MED ORDER — ENOXAPARIN SODIUM 40 MG/0.4ML ~~LOC~~ SOLN
40.0000 mg | SUBCUTANEOUS | Status: DC
  Administered 2015-07-25 – 2015-07-27 (×3): 40 mg via SUBCUTANEOUS
  Filled 2015-07-25 (×3): qty 0.4

## 2015-07-25 MED ORDER — GABAPENTIN 400 MG PO CAPS
400.0000 mg | ORAL_CAPSULE | Freq: Three times a day (TID) | ORAL | Status: DC
  Administered 2015-07-25 – 2015-07-28 (×8): 400 mg via ORAL
  Filled 2015-07-25 (×10): qty 1

## 2015-07-25 MED ORDER — ASPIRIN 325 MG PO TABS
325.0000 mg | ORAL_TABLET | Freq: Every day | ORAL | Status: DC
  Administered 2015-07-25 – 2015-07-26 (×2): 325 mg via ORAL
  Filled 2015-07-25 (×2): qty 1

## 2015-07-25 MED ORDER — CLONIDINE HCL 0.2 MG PO TABS
0.2000 mg | ORAL_TABLET | Freq: Two times a day (BID) | ORAL | Status: DC
  Administered 2015-07-25 – 2015-07-26 (×3): 0.2 mg via ORAL
  Filled 2015-07-25 (×3): qty 1

## 2015-07-25 MED ORDER — METOPROLOL SUCCINATE ER 100 MG PO TB24
150.0000 mg | ORAL_TABLET | Freq: Every day | ORAL | Status: DC
  Administered 2015-07-25 – 2015-07-28 (×4): 150 mg via ORAL
  Filled 2015-07-25 (×4): qty 1

## 2015-07-25 MED ORDER — DOXAZOSIN MESYLATE 2 MG PO TABS
2.0000 mg | ORAL_TABLET | Freq: Every day | ORAL | Status: DC
  Administered 2015-07-25 – 2015-07-27 (×3): 2 mg via ORAL
  Filled 2015-07-25 (×3): qty 1

## 2015-07-25 MED ORDER — GI COCKTAIL ~~LOC~~
30.0000 mL | Freq: Once | ORAL | Status: AC
  Administered 2015-07-25: 30 mL via ORAL
  Filled 2015-07-25: qty 30

## 2015-07-25 MED ORDER — GI COCKTAIL ~~LOC~~: 30.0000 mL | Freq: Four times a day (QID) | ORAL | Status: DC | PRN

## 2015-07-25 MED ORDER — CLONIDINE HCL 0.1 MG PO TABS: 0.1000 mg | ORAL_TABLET | Freq: Three times a day (TID) | ORAL | Status: DC | PRN

## 2015-07-25 MED ORDER — ATORVASTATIN CALCIUM 40 MG PO TABS
40.0000 mg | ORAL_TABLET | Freq: Every day | ORAL | Status: DC
  Administered 2015-07-25 – 2015-07-27 (×3): 40 mg via ORAL
  Filled 2015-07-25 (×4): qty 1

## 2015-07-25 MED ORDER — ISOSORBIDE MONONITRATE ER 60 MG PO TB24
90.0000 mg | ORAL_TABLET | Freq: Every day | ORAL | Status: DC
  Administered 2015-07-25 – 2015-07-26 (×2): 90 mg via ORAL
  Filled 2015-07-25 (×2): qty 1

## 2015-07-25 MED ORDER — ALBUTEROL SULFATE HFA 108 (90 BASE) MCG/ACT IN AERS: 2.0000 | INHALATION_SPRAY | Freq: Four times a day (QID) | RESPIRATORY_TRACT | Status: DC | PRN

## 2015-07-25 MED ORDER — TRAZODONE HCL 50 MG PO TABS
25.0000 mg | ORAL_TABLET | Freq: Every day | ORAL | Status: DC
  Administered 2015-07-25 – 2015-07-27 (×3): 25 mg via ORAL
  Filled 2015-07-25 (×3): qty 1

## 2015-07-25 MED ORDER — FLUTICASONE PROPIONATE 50 MCG/ACT NA SUSP
2.0000 | Freq: Every day | NASAL | Status: DC | PRN
  Filled 2015-07-25: qty 16

## 2015-07-25 MED ORDER — INSULIN ASPART 100 UNIT/ML ~~LOC~~ SOLN
0.0000 [IU] | Freq: Every day | SUBCUTANEOUS | Status: DC
  Administered 2015-07-25 – 2015-07-27 (×2): 2 [IU] via SUBCUTANEOUS

## 2015-07-25 MED ORDER — ACETAMINOPHEN 325 MG PO TABS
650.0000 mg | ORAL_TABLET | Freq: Once | ORAL | Status: AC
  Administered 2015-07-25: 650 mg via ORAL
  Filled 2015-07-25: qty 2

## 2015-07-25 MED ORDER — ACETAMINOPHEN 325 MG PO TABS
650.0000 mg | ORAL_TABLET | ORAL | Status: DC | PRN
  Administered 2015-07-26 – 2015-07-27 (×2): 650 mg via ORAL
  Filled 2015-07-25 (×2): qty 2

## 2015-07-25 MED ORDER — SODIUM CHLORIDE 0.9 % IV SOLN
INTRAVENOUS | Status: AC
  Administered 2015-07-25: 22:00:00 via INTRAVENOUS

## 2015-07-25 MED ORDER — ONDANSETRON HCL 4 MG/2ML IJ SOLN: 4.0000 mg | Freq: Four times a day (QID) | INTRAMUSCULAR | Status: DC | PRN

## 2015-07-25 MED ORDER — EZETIMIBE 10 MG PO TABS
10.0000 mg | ORAL_TABLET | Freq: Every day | ORAL | Status: DC
  Administered 2015-07-25 – 2015-07-28 (×4): 10 mg via ORAL
  Filled 2015-07-25 (×4): qty 1

## 2015-07-25 MED ORDER — LEVOTHYROXINE SODIUM 25 MCG PO TABS
25.0000 ug | ORAL_TABLET | Freq: Every day | ORAL | Status: DC
  Administered 2015-07-26 – 2015-07-28 (×3): 25 ug via ORAL
  Filled 2015-07-25 (×3): qty 1

## 2015-07-25 MED ORDER — MORPHINE SULFATE (PF) 2 MG/ML IV SOLN
2.0000 mg | INTRAVENOUS | Status: DC | PRN
  Administered 2015-07-26 (×2): 2 mg via INTRAVENOUS
  Filled 2015-07-25 (×2): qty 1

## 2015-07-25 MED ORDER — AMLODIPINE BESYLATE 10 MG PO TABS
10.0000 mg | ORAL_TABLET | Freq: Every day | ORAL | Status: DC
  Administered 2015-07-25 – 2015-07-28 (×4): 10 mg via ORAL
  Filled 2015-07-25 (×4): qty 1

## 2015-07-25 MED ORDER — INSULIN ASPART 100 UNIT/ML ~~LOC~~ SOLN
0.0000 [IU] | Freq: Three times a day (TID) | SUBCUTANEOUS | Status: DC
  Administered 2015-07-26: 2 [IU] via SUBCUTANEOUS
  Administered 2015-07-26: 5 [IU] via SUBCUTANEOUS
  Administered 2015-07-26: 3 [IU] via SUBCUTANEOUS
  Administered 2015-07-27 – 2015-07-28 (×4): 5 [IU] via SUBCUTANEOUS
  Administered 2015-07-28: 3 [IU] via SUBCUTANEOUS

## 2015-07-25 NOTE — ED Notes (Signed)
Dr. Marily Memos made aware of patient's chest pain, MD advised he is okay with patient going to telemetry bed at this time. Helene Kelp, RN taking report on patient made aware of patient's chest pain and that Dr. Marily Memos is aware and advised patient is still okay to come to tele bed.

## 2015-07-25 NOTE — ED Provider Notes (Signed)
CSN: [id number]     Arrival date & time 07/25/15  1045 History   First MD Initiated Contact with Patient 07/25/15 1425     Chief Complaint  Patient presents with  . Chest Pain  . Shortness of Breath  . Hand Pain   HPI  Brooke Stafford is a 60 y.o. female PMH significant for asthma, anxiety, coronary artery disease, hypertension, CHF (LVEF 30-35%) presenting with multiple complaints. She endorses right facial and right ear pain that radiates into her right shoulder. She mentions she had one episode of chest pain that was midsternal and lasted approximately 1 hour. She states she's had bilateral hand pain but she is unsure if this started before the chest pain or after. She states she had shortness of breath with the chest pain which was nonexertional but states she felt like she was "being suffocated." She mentions she has generalized body aches but states she has been well hydrated. She endorses generalized abdominal pain which she states is chronic due to her chronic constipation. She denies fevers, chills, recent surgeries, nausea, vomiting, changes in bowel or bladder habits.  Past Medical History  Diagnosis Date  . Asthma   . Anxiety   . GERD (gastroesophageal reflux disease)   . Coronary artery disease 2002    CABG x 6. Cath 5/11- med Rx  . Hypertension   . Peripheral vascular disease ([place]) 12/12    LSFA PTA  . Anemia   . Hyperlipidemia   . Chronic renal insufficiency, stage II (mild)     followed  by [facility]  . CAD (coronary artery disease) 2002; 2015    CABG x 6 2002, cath 2011- med Rx stent DES VG-Diag  . Hypothyroid     treated  . Obesity (BMI 35.0-39.9 without comorbidity) ([place])   . CHF (congestive heart failure) ([place])     "in 2002" (11/26/2012)  . Chronic bronchitis ([place])     "q year; in the winter"   . Type II diabetes mellitus ([place])   . History of blood transfusion 2002    "when I had OHS"  . Gout     "right big toe"  . Anginal pain ([place])   . CAD (coronary  artery disease) of artery bypass graft; DES to VG-Diag 09/28/13 11/09/2013  . Myocardial infarction ([place]) 2000; 2002; 2011  . Pneumonia     "3 times I think" (12/12/2014)  . Migraines     "couple times/year" (08/04/2013), (12/12/2014)  . Headache     "~ q week" (08/04/2013); "~ twice/month" (12/12/2014)  . Arthritis     "stiff fingers and knees" (08/04/2013), (12/12/2014)   Past Surgical History  Procedure Laterality Date  . Cholecystectomy  1982  . Cesarean section  1978; 1980  . Tubal ligation  1980  . Breast cyst excision Right 1970's  . Coronary artery bypass graft  11/20/2000    x6 LIMA to distal LAD, svg to first diag, svg to ramus intermediate branch and swquential SVG to cir marginal branch, SVG to posterior descending coronary and sequential SVG to first right posterolateral branch  . Peripheral arterial stent graft Left     SFA/notes 04/07/2011 (11/30/2012)  . Nm myocar perf wall motion  08/27/2004    negative  . Appendectomy  1980  . Abdominal aortagram N/A 04/05/2011    Procedure: ABDOMINAL AORTAGRAM;  Surgeon: Lorretta Harp, MD;  Location[facility] C[facility] LAB;  Service: Cardiovascular;  Laterality: N/A;  . Renal angiogram N/A 04/05/2011  Procedure: RENAL ANGIOGRAM;  Surgeon: Lorretta Harp, MD;  Location: [facility] CATH LAB;  Service: Cardiovascular;  Laterality: N/A;  . Lower extremity angiogram  12/01/2012    Procedure: LOWER EXTREMITY ANGIOGRAM;  Surgeon: Lorretta Harp, MD;  Location: [facility] CATH LAB;  Service: Cardiovascular;;  . Percutaneous stent intervention Left 12/01/2012    Procedure: PERCUTANEOUS STENT INTERVENTION;  Surgeon: Lorretta Harp, MD;  Location: [facility] CATH LAB;  Service: Cardiovascular;  Laterality: Left;  Left SFA  . Left heart catheterization with coronary/graft angiogram N/A 09/28/2013    Procedure: LEFT HEART CATHETERIZATION WITH Beatrix Fetters;  Surgeon: Peter M Martinique, MD;  Location: [facility] CATH LAB;  Service: Cardiovascular;  Laterality: N/A;  . Cardiac  catheterization  2002  . Coronary angioplasty with stent placement  2004; 2012    "I have 2 stents" (08/04/2013)  . Coronary angioplasty with stent placement  09/28/13    PTCA/ DES Xience stent to VG-Diag   . Cardiac catheterization N/A 12/12/2014    Procedure: Left Heart Cath and Cors/Grafts Angiography;  Surgeon: Peter M Martinique, MD;  Location: [place] CV LAB;  Service: Cardiovascular;  Laterality: N/A;  . Cardiac catheterization N/A 12/12/2014    Procedure: Coronary Stent Intervention;  Surgeon: Peter M Martinique, MD;  Location: [place] CV LAB;  Service: Cardiovascular;  Laterality: N/A;   Family History  Problem Relation Age of Onset  . Diabetes Mother   . Hypertension Mother   . Stroke Mother   . Hypertension Father   . Hypertension Brother   . Hypertension Sister   . Diabetes Sister   . Hyperlipidemia Sister    Social History  Substance Use Topics  . Smoking status: Former Smoker -- 1.00 packs/day for 25 years    Types: Cigarettes    Quit date: 04/04/2000  . Smokeless tobacco: Never Used  . Alcohol Use: Yes     Comment: 12/12/2014  "have a glass of red wine on my birthday q yr; that's it"   OB History    No data available     Review of Systems  Ten systems are reviewed and are negative for acute change except as noted in the HPI  Allergies  Hydralazine; Penicillins cross reactors; and Adhesive  Home Medications   Prior to Admission medications   Medication Sig Start Date End Date Taking? Authorizing Provider  acetaminophen-codeine (TYLENOL #3) 300-30 MG tablet Take 1 tablet by mouth every 4 (four) hours as needed for moderate pain. 04/07/15  Yes Lance Bosch, NP  albuterol (PROVENTIL HFA;VENTOLIN HFA) 108 (90 BASE) MCG/ACT inhaler Inhale 2 puffs into the lungs every 6 (six) hours as needed for wheezing or shortness of breath. 08/11/14  Yes Tresa Garter, MD  allopurinol (ZYLOPRIM) 300 MG tablet Take 1 tablet (300 mg total) by mouth daily. 08/11/14  Yes  Tresa Garter, MD  amLODipine (NORVASC) 10 MG tablet Take 1 tablet (10 mg total) by mouth daily. 12/15/14  Yes Erlene Quan, PA-C  aspirin 81 MG chewable tablet Chew 1 tablet (81 mg total) by mouth daily. 02/24/15  Yes Mihai Croitoru, MD  atorvastatin (LIPITOR) 40 MG tablet Take 1 tablet (40 mg total) by mouth daily. 06/16/15  Yes Tresa Garter, MD  cloNIDine (CATAPRES) 0.2 MG tablet Take 1 tablet (0.2 mg total) by mouth 2 (two) times daily. 05/16/15  Yes Tresa Garter, MD  clopidogrel (PLAVIX) 75 MG tablet Take 1 tablet (75 mg total) by mouth daily. 12/28/14  Yes Sanda Klein, MD  cyclobenzaprine (FLEXERIL) 10 MG tablet Take 1 tablet (10 mg total) by mouth 3 (three) times daily as needed for muscle spasms. 01/27/14  Yes Tresa Garter, MD  doxazosin (CARDURA) 2 MG tablet Take 1 tablet (2 mg total) by mouth at bedtime. 12/15/14  Yes Luke K Kilroy, PA-C  ezetimibe (ZETIA) 10 MG tablet Take 1 tablet (10 mg total) by mouth daily. 06/06/15  Yes Mihai Croitoru, MD  fluticasone (FLONASE) 50 MCG/ACT nasal spray Place 2 sprays into both nostrils daily as needed for allergies or rhinitis. 08/11/14  Yes Tresa Garter, MD  furosemide (LASIX) 40 MG tablet Take 1 tablet (40 mg total) by mouth daily. 12/15/14  Yes Erlene Quan, PA-C  gabapentin (NEURONTIN) 400 MG capsule take 1 capsule by mouth three times a day 02/16/15  Yes Olugbemiga E Doreene Burke, MD  insulin aspart (NOVOLOG FLEXPEN) 100 UNIT/ML FlexPen Inject 10-18 units into the skin 3 times daily, plus sliding scale as instructed. 03/31/15  Yes Philemon Kingdom, MD  Insulin Glargine (TOUJEO SOLOSTAR) 300 UNIT/ML SOPN Inject 40 Units into the skin at bedtime. 03/28/15  Yes Philemon Kingdom, MD  Insulin Pen Needle 31G X 5 MM MISC Use to inject insulin 4 times daily as instructed. 02/16/15  Yes Philemon Kingdom, MD  Insulin Syringes, Disposable, (B-D INSULIN SYRINGE 1CC) U-100 1 ML MISC Use to inject insulin 4 times daily as instructed. 02/22/14   Yes Philemon Kingdom, MD  isosorbide mononitrate (IMDUR) 60 MG 24 hr tablet Take 1.5 tablets (90 mg total) by mouth daily. 11/10/14  Yes Tresa Garter, MD  levothyroxine (SYNTHROID, LEVOTHROID) 25 MCG tablet take 1 tablet by mouth every morning BEFORE BREAKFAST 03/27/15  Yes Olugbemiga E Doreene Burke, MD  lisinopril (PRINIVIL,ZESTRIL) 10 MG tablet Take 1 tablet (10 mg total) by mouth daily. 01/14/15  Yes Mihai Croitoru, MD  loratadine (CLARITIN) 10 MG tablet Take 1 tablet (10 mg total) by mouth daily as needed for allergies. 12/15/14  Yes Luke K Kilroy, PA-C  Menthol-Camphor (TIGER BALM ARTHRITIS RUB) 11-11 % CREA Apply 1 application topically 2 (two) times daily.   Yes Historical Provider, MD  metoprolol succinate (TOPROL-XL) 50 MG 24 hr tablet Take 3 tablets (150 mg total) by mouth daily. Take with or immediately following a meal. 01/25/14  Yes Brett Canales, PA-C  traZODone (DESYREL) 50 MG tablet Take 0.5 tablets (25 mg total) by mouth at bedtime. 05/16/14  Yes Tresa Garter, MD  triamcinolone cream (KENALOG) 0.1 % Apply 1 application topically 2 (two) times daily. 02/23/15  Yes Tresa Garter, MD  nitroGLYCERIN (NITROSTAT) 0.4 MG SL tablet Place 1 tablet (0.4 mg total) under the tongue every 5 (five) minutes as needed. For chest 11/10/14   Tresa Garter, MD   BP 150/72 mmHg  Pulse 61  Temp(Src) 98.1 F (36.7 C) (Oral)  Resp 16  Ht 5\' 10"  (1.778 m)  Wt 113.399 kg  BMI 35.87 kg/m2  SpO2 99%  LMP 10/13/2014 (Exact Date) Physical Exam  Constitutional: She appears well-developed and well-nourished. No distress.  Morbid obesity  HENT:  Head: Normocephalic and atraumatic.  Mouth/Throat: Oropharynx is clear and moist. No oropharyngeal exudate.  Eyes: Conjunctivae are normal. Pupils are equal, round, and reactive to light. Right eye exhibits no discharge. Left eye exhibits no discharge. No scleral icterus.  Neck: No tracheal deviation present.  Cardiovascular: Normal rate, regular  rhythm, normal heart sounds and intact distal pulses.  Exam reveals no gallop and no friction rub.   No  murmur heard. Pulmonary/Chest: Effort normal and breath sounds normal. No respiratory distress. She has no wheezes. She has no rales. She exhibits no tenderness.  Abdominal: Soft. Bowel sounds are normal. She exhibits no distension and no mass. There is no tenderness. There is no rebound and no guarding.  Musculoskeletal: She exhibits no edema.  Lymphadenopathy:    She has no cervical adenopathy.  Neurological: She is alert. Coordination normal.  Skin: Skin is warm and dry. No rash noted. She is not diaphoretic. No erythema.  Chronic venous stasis change in right leg  Psychiatric: She has a normal mood and affect. Her behavior is normal.  Nursing note and vitals reviewed.   ED Course  Procedures  Labs Review Labs Reviewed  BASIC METABOLIC PANEL - Abnormal; Notable for the following:    Glucose, Bld 161 (*)    BUN 25 (*)    Creatinine, Ser 2.37 (*)    GFR calc non Af Amer 21 (*)    GFR calc Af Amer 24 (*)    All other components within normal limits  CBC - Abnormal; Notable for the following:    RBC 3.28 (*)    Hemoglobin 10.4 (*)    HCT 30.1 (*)    All other components within normal limits  I-STAT TROPOININ, ED  I-STAT TROPOININ, ED    Imaging Review Dg Chest 2 View  07/25/2015  CLINICAL DATA:  SOB since last night, face feels swollen, pain in the arms, neck and face, slight CP at times, dry cough for a little while now - hx of exsmoker, PNA in past, CHF, diabetes, htn, asthma, heart surgery, 3 stents, MI EXAM: CHEST  2 VIEW COMPARISON:  Report of 01/25/2014, images not available. FINDINGS: Prior median sternotomy. Lower thoracic spondylosis. Midline trachea. Mild cardiomegaly, accentuated by low lung volumes. No pleural effusion or pneumothorax. Pulmonary interstitial prominence could be due to low lung volumes. Left mid lung scarring with left greater than right base volume  loss. IMPRESSION: Cardiomegaly and low lung volumes. Pulmonary interstitial prominence could be secondary low lung volumes or mild pulmonary venous congestion. No overt congestive failure. Left mid lung scarring with bibasilar volume loss. Electronically Signed   By: Abigail Miyamoto M.D.   On: 07/25/2015 12:18   I have personally reviewed and evaluated these images and lab results as part of my medical decision-making.   EKG Interpretation   Date/Time:  Tuesday July 25 2015 10:58:01 EDT Ventricular Rate:  72 PR Interval:  186 QRS Duration: 98 QT Interval:  408 QTC Calculation: 446 R Axis:   54 Text Interpretation:  Normal sinus rhythm T wave abnormality, consider  inferolateral ischemia Abnormal ECG Nonspecific ST and T wave abnormality  No acute changes Confirmed by Kathrynn Humble, MD, Thelma Comp [phone]) on 07/25/2015  1:10:36 PM      MDM   Final diagnoses:  Chest pain, unspecified chest pain type  Creatinine elevation   Patient nontoxic appearing, VSS. Based on patient history and physical exam, most likely etiologies include ACS (HEART score 5) vs CHF/acute pulmonary edema vs anxiety. Less likely etiologies include Prinzmetal's/cocaine-induced angina,  aortic dissection, thoracic aortic aneurysm, pneumonia, pleuritis, pneumothorax, tension pneumothorax, pulmonary embolism, pulmonary HTN, GERD, esophageal spasm, Mallory-Weiss tear, Boerhaave syndrome, peptic ulcer diease, biliary disease, pancreatitis, chest wall pain/costochondritis, osteoarthritis/radiculopathy, herpes zoster. Troponin 2 negative, EKG with nonspecific ST and T-wave abnormalities (no acute change), CBC and CXR unremarkable for acute change. BMP with elevated creatinine of 2.37 (baseline 1.4-1.6) and hyperglycemia of 161.  Spoke with  Dr. Marily Memos regarding admission, patient may be safely transferred at this time.   Gadsden Lions, PA-C 07/29/15 [id number]  Varney Biles, MD 07/30/15 [phone]

## 2015-07-25 NOTE — H&P (Signed)
[facility] History and Physical  Brooke Stafford [id number] DOB: 06-16-1955 DOA: 07/25/2015  Referring physician: Ovid Curd PCP: Angelica Chessman, MD   Chief Complaint: chest pain/sob  HPI: Brooke Stafford is a 60 y.o. female with a past medical history that includes asthma, anxiety, COPD, hypertension, CHF, CAD status post catheterization 2002 and stents, diabetes presents to the emergency department with the chief complaint of anterior chest pain. Initial evaluation re-assuring regarding a cardiac etiology but given her history will admit to rule out.  Patient reports she was watching TV this morning she developed mid sternal chest pain that radiated to both shoulders and down both arms as well as right facial pain. Associated symptoms include shortness of breath diaphoresis. She denies palpitations nausea vomiting abdominal pain. She also complains of generalized body aches over the last several weeks. She denies any headache dizziness syncope or near-syncope. She denies lower extremity edema or orthopnea. She denies fever chills decreased oral intake. She denies dysuria hematuria frequency or urgency. She does report a history of arthritis complains of bilateral arm pain that she attributes to her arthritis. She also reports she has an appointment in 2 days with her primary care provider.  In the emergency department she's afebrile hemodynamically stable and not hypoxic  Review of Systems:  10 point review of systems complete and all systems are negative except as indicated in the history of present illness Past Medical History  Diagnosis Date  . Asthma   . Anxiety   . GERD (gastroesophageal reflux disease)   . Coronary artery disease 2002    CABG x 6. Cath 5/11- med Rx  . Hypertension   . Peripheral vascular disease ([place]) 12/12    LSFA PTA  . Anemia   . Hyperlipidemia   . Chronic renal insufficiency, stage II (mild)     followed  by [facility]  . CAD (coronary  artery disease) 2002; 2015    CABG x 6 2002, cath 2011- med Rx stent DES VG-Diag  . Hypothyroid     treated  . Obesity (BMI 35.0-39.9 without comorbidity) ([place])   . CHF (congestive heart failure) ([place])     "in 2002" (11/26/2012)  . Chronic bronchitis ([place])     "q year; in the winter"   . Type II diabetes mellitus ([place])   . History of blood transfusion 2002    "when I had OHS"  . Gout     "right big toe"  . Anginal pain ([place])   . CAD (coronary artery disease) of artery bypass graft; DES to VG-Diag 09/28/13 11/09/2013  . Myocardial infarction ([place]) 2000; 2002; 2011  . Pneumonia     "3 times I think" (12/12/2014)  . Migraines     "couple times/year" (08/04/2013), (12/12/2014)  . Headache     "~ q week" (08/04/2013); "~ twice/month" (12/12/2014)  . Arthritis     "stiff fingers and knees" (08/04/2013), (12/12/2014)   Past Surgical History  Procedure Laterality Date  . Cholecystectomy  1982  . Cesarean section  1978; 1980  . Tubal ligation  1980  . Breast cyst excision Right 1970's  . Coronary artery bypass graft  11/20/2000    x6 LIMA to distal LAD, svg to first diag, svg to ramus intermediate branch and swquential SVG to cir marginal branch, SVG to posterior descending coronary and sequential SVG to first right posterolateral branch  . Peripheral arterial stent graft Left     SFA/notes 04/07/2011 (11/30/2012)  . Nm myocar perf wall motion  08/27/2004    negative  . Appendectomy  1980  . Abdominal aortagram N/A 04/05/2011    Procedure: ABDOMINAL AORTAGRAM;  Surgeon: Lorretta Harp, MD;  Location: [facility] CATH LAB;  Service: Cardiovascular;  Laterality: N/A;  . Renal angiogram N/A 04/05/2011    Procedure: RENAL ANGIOGRAM;  Surgeon: Lorretta Harp, MD;  Location: [facility] CATH LAB;  Service: Cardiovascular;  Laterality: N/A;  . Lower extremity angiogram  12/01/2012    Procedure: LOWER EXTREMITY ANGIOGRAM;  Surgeon: Lorretta Harp, MD;  Location: [facility] CATH LAB;  Service: Cardiovascular;;  .  Percutaneous stent intervention Left 12/01/2012    Procedure: PERCUTANEOUS STENT INTERVENTION;  Surgeon: Lorretta Harp, MD;  Location: [facility] CATH LAB;  Service: Cardiovascular;  Laterality: Left;  Left SFA  . Left heart catheterization with coronary/graft angiogram N/A 09/28/2013    Procedure: LEFT HEART CATHETERIZATION WITH Beatrix Fetters;  Surgeon: Peter M Martinique, MD;  Location: [facility] CATH LAB;  Service: Cardiovascular;  Laterality: N/A;  . Cardiac catheterization  2002  . Coronary angioplasty with stent placement  2004; 2012    "I have 2 stents" (08/04/2013)  . Coronary angioplasty with stent placement  09/28/13    PTCA/ DES Xience stent to VG-Diag   . Cardiac catheterization N/A 12/12/2014    Procedure: Left Heart Cath and Cors/Grafts Angiography;  Surgeon: Peter M Martinique, MD;  Location: [place] CV LAB;  Service: Cardiovascular;  Laterality: N/A;  . Cardiac catheterization N/A 12/12/2014    Procedure: Coronary Stent Intervention;  Surgeon: Peter M Martinique, MD;  Location: [place] CV LAB;  Service: Cardiovascular;  Laterality: N/A;   Social History:  reports that she quit smoking about 15 years ago. Her smoking use included Cigarettes. She has a 25 pack-year smoking history. She has never used smokeless tobacco. She reports that she drinks alcohol. She reports that she does not use illicit drugs.  Allergies  Allergen Reactions  . Hydralazine Shortness Of Breath  . Penicillins Cross Reactors Hives    And high fever  . Adhesive [Tape] Rash    bruising    Family History  Problem Relation Age of Onset  . Diabetes Mother   . Hypertension Mother   . Stroke Mother   . Hypertension Father   . Hypertension Brother   . Hypertension Sister   . Diabetes Sister   . Hyperlipidemia Sister     Prior to Admission medications   Medication Sig Start Date End Date Taking? Authorizing Provider  acetaminophen-codeine (TYLENOL #3) 300-30 MG tablet Take 1 tablet by mouth every 4 (four) hours  as needed for moderate pain. 04/07/15  Yes Lance Bosch, NP  albuterol (PROVENTIL HFA;VENTOLIN HFA) 108 (90 BASE) MCG/ACT inhaler Inhale 2 puffs into the lungs every 6 (six) hours as needed for wheezing or shortness of breath. 08/11/14  Yes Tresa Garter, MD  allopurinol (ZYLOPRIM) 300 MG tablet Take 1 tablet (300 mg total) by mouth daily. 08/11/14  Yes Tresa Garter, MD  amLODipine (NORVASC) 10 MG tablet Take 1 tablet (10 mg total) by mouth daily. 12/15/14  Yes Erlene Quan, PA-C  aspirin 81 MG chewable tablet Chew 1 tablet (81 mg total) by mouth daily. 02/24/15  Yes Mihai Croitoru, MD  atorvastatin (LIPITOR) 40 MG tablet Take 1 tablet (40 mg total) by mouth daily. 06/16/15  Yes Tresa Garter, MD  cloNIDine (CATAPRES) 0.2 MG tablet Take 1 tablet (0.2 mg total) by mouth 2 (two) times daily. 05/16/15  Yes Tresa Garter, MD  clopidogrel (PLAVIX) 75 MG tablet Take 1 tablet (75 mg total) by mouth daily. 12/28/14  Yes Mihai Croitoru, MD  cyclobenzaprine (FLEXERIL) 10 MG tablet Take 1 tablet (10 mg total) by mouth 3 (three) times daily as needed for muscle spasms. 01/27/14  Yes Tresa Garter, MD  doxazosin (CARDURA) 2 MG tablet Take 1 tablet (2 mg total) by mouth at bedtime. 12/15/14  Yes Luke K Kilroy, PA-C  ezetimibe (ZETIA) 10 MG tablet Take 1 tablet (10 mg total) by mouth daily. 06/06/15  Yes Mihai Croitoru, MD  fluticasone (FLONASE) 50 MCG/ACT nasal spray Place 2 sprays into both nostrils daily as needed for allergies or rhinitis. 08/11/14  Yes Tresa Garter, MD  furosemide (LASIX) 40 MG tablet Take 1 tablet (40 mg total) by mouth daily. 12/15/14  Yes Erlene Quan, PA-C  gabapentin (NEURONTIN) 400 MG capsule take 1 capsule by mouth three times a day 02/16/15  Yes Olugbemiga E Doreene Burke, MD  insulin aspart (NOVOLOG FLEXPEN) 100 UNIT/ML FlexPen Inject 10-18 units into the skin 3 times daily, plus sliding scale as instructed. 03/31/15  Yes Philemon Kingdom, MD  Insulin Glargine  (TOUJEO SOLOSTAR) 300 UNIT/ML SOPN Inject 40 Units into the skin at bedtime. 03/28/15  Yes Philemon Kingdom, MD  Insulin Pen Needle 31G X 5 MM MISC Use to inject insulin 4 times daily as instructed. 02/16/15  Yes Philemon Kingdom, MD  Insulin Syringes, Disposable, (B-D INSULIN SYRINGE 1CC) U-100 1 ML MISC Use to inject insulin 4 times daily as instructed. 02/22/14  Yes Philemon Kingdom, MD  isosorbide mononitrate (IMDUR) 60 MG 24 hr tablet Take 1.5 tablets (90 mg total) by mouth daily. 11/10/14  Yes Tresa Garter, MD  levothyroxine (SYNTHROID, LEVOTHROID) 25 MCG tablet take 1 tablet by mouth every morning BEFORE BREAKFAST 03/27/15  Yes Olugbemiga E Doreene Burke, MD  lisinopril (PRINIVIL,ZESTRIL) 10 MG tablet Take 1 tablet (10 mg total) by mouth daily. 01/14/15  Yes Mihai Croitoru, MD  loratadine (CLARITIN) 10 MG tablet Take 1 tablet (10 mg total) by mouth daily as needed for allergies. 12/15/14  Yes Luke K Kilroy, PA-C  Menthol-Camphor (TIGER BALM ARTHRITIS RUB) 11-11 % CREA Apply 1 application topically 2 (two) times daily.   Yes Historical Provider, MD  metoprolol succinate (TOPROL-XL) 50 MG 24 hr tablet Take 3 tablets (150 mg total) by mouth daily. Take with or immediately following a meal. 01/25/14  Yes Brett Canales, PA-C  traZODone (DESYREL) 50 MG tablet Take 0.5 tablets (25 mg total) by mouth at bedtime. 05/16/14  Yes Tresa Garter, MD  triamcinolone cream (KENALOG) 0.1 % Apply 1 application topically 2 (two) times daily. 02/23/15  Yes Tresa Garter, MD  nitroGLYCERIN (NITROSTAT) 0.4 MG SL tablet Place 1 tablet (0.4 mg total) under the tongue every 5 (five) minutes as needed. For chest 11/10/14   Tresa Garter, MD   Physical Exam: Filed Vitals:   07/25/15 1215 07/25/15 1345 07/25/15 1430 07/25/15 1600  BP: 166/75 141/69 150/72 169/88  Pulse: 55 63 61 65  Temp:      TempSrc:      Resp: 14 14 16 13   Height:      Weight:      SpO2: 99% 96% 99% 93%    Wt Readings from Last 3  Encounters:  07/25/15 113.399 kg (250 lb)  03/28/15 110.224 kg (243 lb)  02/23/15 107.775 kg (237 lb 9.6 oz)    General:  Appears calm and comfortable, ambulating in hall without problem,  whiny, obese Eyes: PERRL, normal lids, irises & conjunctiva ENT: grossly normal hearing, lips & tongue is membranes of her mouth are pink slightly dry Neck: no LAD, masses or thyromegaly Cardiovascular: RRR, no m/r/g. No LE edema. Venous stasis changes Telemetry: SR, no arrhythmias  Respiratory: CTA bilaterally, no w/r/r. Normal respiratory effort. Abdomen: soft, ntnd obese positive bowel sounds no guarding or rebounding Skin: no rash or induration seen on limited exam Musculoskeletal: grossly normal tone BUE/BLE Psychiatric: grossly normal mood and affect, speech fluent and appropriate Neurologic: grossly non-focal. speech clear facial symmetry           Labs on Admission:  Basic Metabolic Panel:  Recent Labs Lab 07/25/15 1125  NA 144  K 3.9  CL 107  CO2 27  GLUCOSE 161*  BUN 25*  CREATININE 2.37*  CALCIUM 9.6   Liver Function Tests: No results for input(s): AST, ALT, ALKPHOS, BILITOT, PROT, ALBUMIN in the last 168 hours. No results for input(s): LIPASE, AMYLASE in the last 168 hours. No results for input(s): AMMONIA in the last 168 hours. CBC:  Recent Labs Lab 07/25/15 1125  WBC 5.8  HGB 10.4*  HCT 30.1*  MCV 91.8  PLT 203   Cardiac Enzymes: No results for input(s): CKTOTAL, CKMB, CKMBINDEX, TROPONINI in the last 168 hours.  BNP (last 3 results)  Recent Labs  07/25/15 1610  BNP 339.5*    ProBNP (last 3 results) No results for input(s): PROBNP in the last 8760 hours.  CBG: No results for input(s): GLUCAP in the last 168 hours.  Radiological Exams on Admission: Dg Chest 2 View  07/25/2015  CLINICAL DATA:  SOB since last night, face feels swollen, pain in the arms, neck and face, slight CP at times, dry cough for a little while now - hx of exsmoker, PNA in past,  CHF, diabetes, htn, asthma, heart surgery, 3 stents, MI EXAM: CHEST  2 VIEW COMPARISON:  Report of 01/25/2014, images not available. FINDINGS: Prior median sternotomy. Lower thoracic spondylosis. Midline trachea. Mild cardiomegaly, accentuated by low lung volumes. No pleural effusion or pneumothorax. Pulmonary interstitial prominence could be due to low lung volumes. Left mid lung scarring with left greater than right base volume loss. IMPRESSION: Cardiomegaly and low lung volumes. Pulmonary interstitial prominence could be secondary low lung volumes or mild pulmonary venous congestion. No overt congestive failure. Left mid lung scarring with bibasilar volume loss. Electronically Signed   By: Abigail Miyamoto M.D.   On: 07/25/2015 12:18    EKG: Independently reviewed. Normal sinus rhythm T wave abnormality, consider inferolateral ischemia Abnormal ECG Nonspecific ST and T wave abnormality No acute changes  Assessment/Plan Principal Problem:   Chest pain at rest Active Problems:   HTN (hypertension), malignant, patent renal arteries   CAD -S/P PCI June 2015 and 12/14/14   Hypothyroidism   Diabetic neuropathy, type II diabetes mellitus ([place])   Acute kidney injury superimposed on chronic kidney disease ([place])   Creatinine elevation   Chronic combined systolic and diastolic heart failure ([place])  #1. Chest pain at rest. Atypical. Heart score is 5. Likely related to musculoskelatal. Pain reproducible. Admit for observation to rule out. Initial troponin negative. EKG without acute changes. Chest x-ray with cardiomegaly, pulmonary interstitial prominence secondary low lung volumes or mild pulmonary venous congestion. No overt congestive failure. History of CAD status post CABG 6 in 2002, and STEMI in 2011, in 2015 she admitted with crescendo angina and received a stent per chart review. On Plavix and low-dose aspirin -Admit  to telemetry -Cycle troponins -lipid panel - serial EKG -Supportive  therapy -Nitroglycerin and morphine as needed -continue statin, aspirin, zetia -Continue Plavix -She reports having an appointment with cardiology next week.  #2. Kidney injury superimposed on chronic kidney disease stage II. Creatinine 2.37 on admission. It appears her baseline closer 1.6. Likely related to decreased oral intake in the setting of ACE inhibitor. -We will hold nephrotoxins -Monitor urine output -Gentle IV fluids -Recheck in the morning  #3. Hypertension. Fair control in the emergency department. Home medications include amlodipine, clonidine, Cardura, Lasix, lisinopril, Toprol -We'll hold Lasix lisinopril due to #2 -Continue other antihypertensives -Monitor closely  4. Chronic combined systolic and diastolic heart failure. Appears compensated. Echo in July 2016 showed an ejection fraction of 35-40% with grade 2 diastolic dysfunction. Chest x-ray as noted above. BNP pending. -Monitor intake and output -Obtain daily weights - Holding Lasix and ACE inhibitor for now secondary to #2 -Continue beta blocker  #5. Diabetes. Type II. Uncontrolled. Serum glucose 161 on admission Review indicates A1c in November 2016 6.4. Home medications include Lantus and NovoLog -Obtain hemoglobin A1c  - Continue Lantus at slightly lower dose -Use sliding scale insulin for optimal control  #.6 Hypothyroidism. TSH in August 2016 2.0. -We'll continue home Synthroid -Obtain a TSH  Code Status: full DVT Prophylaxis: Family Communication: significant other at bedside Disposition Plan: home hopefully 24 hours  Time spent: 77 minutes  [facility]

## 2015-07-25 NOTE — ED Notes (Signed)
Pt c/o R facial & R ear pain that radiates into R shoulder, pt c/o mid CP & SOB onset last night, pt c/o generalized body aches, denies fever, hx CABG, pt denies coughing, A&O x4, follows commands, speaks in complete sentences, denies n/v/d

## 2015-07-25 NOTE — ED Notes (Signed)
Pt c/o R hand swelling onset x 3-4 days, pt denies injury

## 2015-07-25 NOTE — ED Notes (Signed)
Dr. Marily Memos called back stating that patient's pain is most likely musculoskeletal and non cardiac related and patient is okay to go to 2W tele bed at this time. Langley Gauss, RN with rapid response at bedside, made aware of this as well.

## 2015-07-26 ENCOUNTER — Encounter (HOSPITAL_COMMUNITY): Payer: Self-pay | Admitting: General Practice

## 2015-07-26 DIAGNOSIS — I5041 Acute combined systolic (congestive) and diastolic (congestive) heart failure: Secondary | ICD-10-CM

## 2015-07-26 DIAGNOSIS — R079 Chest pain, unspecified: Secondary | ICD-10-CM | POA: Diagnosis not present

## 2015-07-26 DIAGNOSIS — N189 Chronic kidney disease, unspecified: Secondary | ICD-10-CM | POA: Diagnosis not present

## 2015-07-26 DIAGNOSIS — Z9861 Coronary angioplasty status: Secondary | ICD-10-CM

## 2015-07-26 DIAGNOSIS — I2510 Atherosclerotic heart disease of native coronary artery without angina pectoris: Secondary | ICD-10-CM

## 2015-07-26 DIAGNOSIS — N179 Acute kidney failure, unspecified: Secondary | ICD-10-CM | POA: Diagnosis not present

## 2015-07-26 DIAGNOSIS — Z955 Presence of coronary angioplasty implant and graft: Secondary | ICD-10-CM | POA: Diagnosis not present

## 2015-07-26 DIAGNOSIS — I10 Essential (primary) hypertension: Secondary | ICD-10-CM | POA: Diagnosis not present

## 2015-07-26 DIAGNOSIS — R0789 Other chest pain: Secondary | ICD-10-CM | POA: Diagnosis not present

## 2015-07-26 LAB — GLUCOSE, CAPILLARY
GLUCOSE-CAPILLARY: 215 mg/dL — AB (ref 65–99)
Glucose-Capillary: 180 mg/dL — ABNORMAL HIGH (ref 65–99)
Glucose-Capillary: 132 mg/dL — ABNORMAL HIGH (ref 65–99)
Glucose-Capillary: 144 mg/dL — ABNORMAL HIGH (ref 65–99)

## 2015-07-26 LAB — RAPID URINE DRUG SCREEN, HOSP PERFORMED
Amphetamines: NOT DETECTED
BARBITURATES: NOT DETECTED
Benzodiazepines: NOT DETECTED
Cocaine: NOT DETECTED
Opiates: POSITIVE — AB
Tetrahydrocannabinol: NOT DETECTED

## 2015-07-26 LAB — SODIUM, URINE, RANDOM: SODIUM UR: 62 mmol/L

## 2015-07-26 LAB — BASIC METABOLIC PANEL
Anion gap: 9 (ref 5–15)
BUN: 27 mg/dL — ABNORMAL HIGH (ref 6–20)
CALCIUM: 9.3 mg/dL (ref 8.9–10.3)
CO2: 26 mmol/L (ref 22–32)
CREATININE: 2.54 mg/dL — AB (ref 0.44–1.00)
Chloride: 106 mmol/L (ref 101–111)
GFR calc Af Amer: 23 mL/min — ABNORMAL LOW (ref >60)
GFR calc non Af Amer: 19 mL/min — ABNORMAL LOW (ref >60)
GLUCOSE: 209 mg/dL — AB (ref 65–99)
Potassium: 4.2 mmol/L (ref 3.5–5.1)
Sodium: 141 mmol/L (ref 135–145)

## 2015-07-26 LAB — INFLUENZA PANEL BY PCR (TYPE A & B)
H1N1FLUPCR: NOT DETECTED
INFLBPCR: NEGATIVE
Influenza A By PCR: NEGATIVE

## 2015-07-26 LAB — TROPONIN I

## 2015-07-26 MED ORDER — FUROSEMIDE 10 MG/ML IJ SOLN
40.0000 mg | Freq: Two times a day (BID) | INTRAMUSCULAR | Status: DC
Start: 2015-07-26 — End: 2015-07-27
  Administered 2015-07-26: 40 mg via INTRAVENOUS
  Filled 2015-07-26: qty 4

## 2015-07-26 MED ORDER — ISOSORBIDE MONONITRATE ER 60 MG PO TB24
120.0000 mg | ORAL_TABLET | Freq: Every day | ORAL | Status: DC
  Administered 2015-07-27 – 2015-07-28 (×2): 120 mg via ORAL
  Filled 2015-07-26 (×2): qty 2

## 2015-07-26 NOTE — Progress Notes (Signed)
07/26/2015 1:44 PM Pt just was made aware that her passed away yesterday.  The family does not know the details of his death.  Pt is fairly calm but is having some CP now.  Will medicate and monitor for any changes. [place]

## 2015-07-26 NOTE — Progress Notes (Signed)
T [facility] Progress Note                                            Patient Demographics:    Brooke Stafford, is a 60 y.o. female, DOB - Mar 08, 1956, [id number]  Admit date - 07/25/2015   Admitting Physician Waldemar Dickens, MD  Outpatient Primary MD for the patient is Angelica Chessman, MD  Chief Complaint  Patient presents with  . Chest Pain  . Shortness of Breath  . Hand Pain        Subjective:    feels ok now, no further chest pain, breathing ok   Assessment  & Plan :   #1. Atypical Chest pain -troponin negative. EKG  With TWI in lateral leads -History of CAD status post CABG 6 in 2002, and STEMI in 2011, last stent was DES to ramus in 11/2014 -given extensive cardiac history will ask Cards to eval -continue  aspirin, plavix, toprol, statin, zetia -FU EHCO  #2. AKi on CKD 3 -Creatinine 2.37 on admission, baseline closer 1.6 -hold ACE, lasix, Likely related to decreased oral intake in the setting of ACE inhibitor. -stop IVF, monitor, labs pending this am  #3. Hypertension.  -stable, continue amlodipine, clonidine, Cardura, Toprol -hold Lasix lisinopril   4. Chronic combined systolic and diastolic heart failure. Appears compensated. Echo in July 2016 showed an ejection fraction of 35-40% with grade 2 diastolic dysfunction.  - Holding Lasix and ACE inhibitor for now secondary to #2, stop IVF -Continue toprol  #5. Diabetes. Type II. Uncontrolled. -A1c in November 2016 6.4.  -continue lantus at lower dose, SSI  #.6 Hypothyroidism. TSH in August 2016 2.0. --continue home Synthroid  Code Status: full DVT Prophylaxis: lovenox Family Communication: significant other at bedside Disposition Plan: home hopefully 24 hours  Lab Results  Component Value Date   PLT 203 07/25/2015   Anti-infectives    None        Objective:   Filed Vitals:   07/25/15 1840 07/25/15 2040 07/26/15 0625 07/26/15 0954  BP: 175/80 175/80 146/72 151/62  Pulse: 64 69 64   Temp:  98.2 F (36.8 C) 98.5 F (36.9 C)   TempSrc:  Oral Oral   Resp:  17 17   Height:      Weight:   113.3 kg (249 lb 12.5 oz)   SpO2: 97% 94% 97%     Wt Readings from Last 3 Encounters:  07/26/15 113.3 kg (249 lb 12.5 oz)  03/28/15 110.224 kg (243 lb)  02/23/15 107.775 kg (237 lb 9.6 oz)     Intake/Output Summary (Last 24 hours) at 07/26/15 1010 Last data filed at 07/26/15 0800  Gross per 24 hour  Intake      0 ml  Output      0 ml  Net      0 ml     Physical Exam  Awake Alert, Oriented X 3, No new F.N deficits, Normal affect HEENT: Supple Neck,No JVD, No cervical lymphadenopathy appreciated.  Lungs: Symmetrical Chest wall movement, Good air movement bilaterally, CTAB CVS: RRR,No Gallops,Rubs or new Murmurs, No Parasternal Heave Abd: +ve B.Sounds, Abd Soft, No tenderness, No organomegaly appriciated, No rebound - guarding or rigidity. No Cyanosis, Clubbing or edema, No new Rash or bruise     Data Review:    CBC  Recent Labs Lab 07/25/15 1125  WBC 5.8  HGB 10.4*  HCT 30.1*  PLT 203  MCV 91.8  MCH 31.7  MCHC 34.6  RDW 14.1    Chemistries   Recent Labs Lab 07/25/15 1125 07/26/15 0326  NA 144 141  K 3.9 4.2  CL 107 106  CO2 27 26  GLUCOSE 161* 209*  BUN 25* 27*  CREATININE 2.37* 2.54*  CALCIUM 9.6 9.3   ------------------------------------------------------------------------------------------------------------------  Recent Labs  07/25/15 2037  CHOL 142  HDL 39*  LDLCALC 59  TRIG 221*  CHOLHDL 3.6    Lab Results  Component Value Date   HGBA1C 6.40 02/23/2015   ------------------------------------------------------------------------------------------------------------------  Recent Labs  07/25/15 2037  TSH 3.124    ------------------------------------------------------------------------------------------------------------------ No results for input(s): VITAMINB12, FOLATE, FERRITIN, TIBC, IRON, RETICCTPCT in the last 72 hours.  Coagulation profile No results for input(s): INR, PROTIME in the last 168 hours.  No results for input(s): DDIMER in the last 72 hours.  Cardiac Enzymes  Recent Labs Lab 07/25/15 2037 07/26/15 0326  TROPONINI <0.03 <0.03   ------------------------------------------------------------------------------------------------------------------    Component Value Date/Time   BNP 339.5* 07/25/2015 1610    Inpatient Medications  Scheduled Meds: . allopurinol  300 mg Oral Daily  . amLODipine  10 mg Oral Daily  . aspirin  325 mg Oral Daily  . atorvastatin  40 mg Oral Daily  . cloNIDine  0.2 mg Oral BID  . clopidogrel  75 mg Oral Daily  . doxazosin  2 mg Oral QHS  . enoxaparin (LOVENOX) injection  40 mg Subcutaneous Q24H  . ezetimibe  10 mg Oral Daily  . gabapentin  400 mg Oral TID  . insulin aspart  0-15 Units Subcutaneous TID WC  . insulin aspart  0-5 Units Subcutaneous QHS  . insulin glargine  20 Units Subcutaneous QHS  . isosorbide mononitrate  90 mg Oral Daily  . levothyroxine  25 mcg Oral QAC breakfast  . metoprolol succinate  150 mg Oral Daily  . traZODone  25 mg Oral QHS   Continuous Infusions:  PRN Meds:.acetaminophen, albuterol, ALPRAZolam, cloNIDine, fluticasone, gi cocktail, morphine injection, ondansetron (ZOFRAN) IV  Micro Results No results found for this or any previous visit (from the past 240 hour(s)).  Radiology Reports Dg Chest 2 View  07/25/2015  CLINICAL DATA:  SOB since last night, face feels swollen, pain in the arms, neck and face, slight CP at times, dry cough for a little while now - hx of exsmoker, PNA in past, CHF, diabetes, htn, asthma, heart surgery, 3 stents, MI EXAM: CHEST  2 VIEW COMPARISON:  Report of 01/25/2014, images not  available. FINDINGS: Prior median sternotomy. Lower thoracic spondylosis. Midline trachea. Mild cardiomegaly, accentuated by low lung volumes. No pleural effusion or pneumothorax. Pulmonary interstitial prominence could be due to low lung volumes. Left mid lung scarring with left greater than right base volume loss. IMPRESSION: Cardiomegaly and low lung volumes. Pulmonary interstitial prominence could be secondary low lung volumes or mild pulmonary venous congestion. No overt congestive failure. Left mid lung scarring with bibasilar volume loss. Electronically Signed   By: Abigail Miyamoto M.D.   On: 07/25/2015 12:18    Time Spent in minutes   43min   Aeriel Boulay M.D on 07/26/2015 at 10:10 AM  Between 7am to 7pm - Pager - [phone]  After 7pm go to [email or web address] - password [facility]  [facility] -  Office  [phone]

## 2015-07-26 NOTE — Consult Note (Signed)
CARDIOLOGY CONSULT NOTE   Patient ID: Brooke Stafford MRN: [id number] DOB/AGE: Feb 27, 1956 60 y.o.  Admit date: 07/25/2015  Primary Physician   Angelica Chessman, MD Primary Cardiologist: Dr. Sallyanne Kuster Reason for Consultation: chest pain   HPI: Brooke Stafford is a 60 year old female with past medical history of CAD, s/p CABG in 2002, DM, and HLD. She presented to the ED on 07/25/15 with chest pain that radiated to her shoulders and both arms.   She had NSTEMI from demand ischemia in July 2011 in the setting of sepsis and medication non compliance. Her cath showed patent LIMA to LAD, patent SVG to diagonal, and patent sequential SVG to branches of the RCA.  Her echo at that time showed mild ischemic cardiomyopathy and EF of 45-50%.       She had a left heart cath in June 2015 when she presented with unstable angina and received DES to the SVG to diagonal (Cath report below).    She also had a cath in August 2016 after having symptoms of CHF and a newly reduced EF of 35-40%, at that time she received a DES to ramus. (Cath report below).    She was watching TV yesterday morning and developed mid sternal chest pain that radiated to both shoulders and arms.  She did feel SOB, no diaphoresis. No nausea or vomiting.  She states that she has had some general body aches over the past couple of weeks.  Of note, her chest pain is reproducible with palpation.  She does report taking one SL Nitro at home which relieved her pain. She has had no further chest pain since arriving to the unit.   Her troponin is negative x 2.  EKG shows inverted t waves that are also noted on last EKG from Sept. 2016. She is currently not having any chest pain.  She complains more that she feels tight in her belly and SOB with activity.      Past Medical History  Diagnosis Date  . Asthma   . Anxiety   . GERD (gastroesophageal reflux disease)   . Coronary artery disease 2002    CABG x 6. Cath 5/11- med Rx  .  Hypertension   . Peripheral vascular disease ([place]) 12/12    LSFA PTA  . Anemia   . Hyperlipidemia   . Chronic renal insufficiency, stage II (mild)     followed  by [facility]  . CAD (coronary artery disease) 2002; 2015    CABG x 6 2002, cath 2011- med Rx stent DES VG-Diag  . Hypothyroid     treated  . Obesity (BMI 35.0-39.9 without comorbidity) ([place])   . CHF (congestive heart failure) ([place])     "in 2002" (11/26/2012)  . Chronic bronchitis ([place])     "q year; in the winter"   . Type II diabetes mellitus ([place])   . History of blood transfusion 2002    "when I had OHS"  . Gout     "right big toe"  . Anginal pain ([place])   . CAD (coronary artery disease) of artery bypass graft; DES to VG-Diag 09/28/13 11/09/2013  . Myocardial infarction ([place]) 2000; 2002; 2011  . Pneumonia     "3 times I think" (12/12/2014)  . Migraines     "couple times/year" (08/04/2013), (12/12/2014)  . Headache     "~ q week" (08/04/2013); "~ twice/month" (12/12/2014)  . Arthritis     "stiff fingers and knees" (08/04/2013), (12/12/2014)  Past Surgical History  Procedure Laterality Date  . Cholecystectomy  1982  . Cesarean section  1978; 1980  . Tubal ligation  1980  . Breast cyst excision Right 1970's  . Coronary artery bypass graft  11/20/2000    x6 LIMA to distal LAD, svg to first diag, svg to ramus intermediate branch and swquential SVG to cir marginal branch, SVG to posterior descending coronary and sequential SVG to first right posterolateral branch  . Peripheral arterial stent graft Left     SFA/notes 04/07/2011 (11/30/2012)  . Nm myocar perf wall motion  08/27/2004    negative  . Appendectomy  1980  . Abdominal aortagram N/A 04/05/2011    Procedure: ABDOMINAL AORTAGRAM;  Surgeon: Lorretta Harp, MD;  Location[facility] CATH LAB;  Service: Cardiovascular;  Laterality: N/A;  . Renal angiogram N/A 04/05/2011    Procedure: RENAL ANGIOGRAM;  Surgeon: Lorretta Harp, MD;  Location: [facility] CATH LAB;  Service:  Cardiovascular;  Laterality: N/A;  . Lower extremity angiogram  12/01/2012    Procedure: LOWER EXTREMITY ANGIOGRAM;  Surgeon: Lorretta Harp, MD;  Location: [facility] CATH LAB;  Service: Cardiovascular;;  . Percutaneous stent intervention Left 12/01/2012    Procedure: PERCUTANEOUS STENT INTERVENTION;  Surgeon: Lorretta Harp, MD;  Location: [facility] CATH LAB;  Service: Cardiovascular;  Laterality: Left;  Left SFA  . Left heart catheterization with coronary/graft angiogram N/A 09/28/2013    Procedure: LEFT HEART CATHETERIZATION WITH Beatrix Fetters;  Surgeon: Peter M Martinique, MD;  Location: [facility] CATH LAB;  Service: Cardiovascular;  Laterality: N/A;  . Cardiac catheterization  2002  . Coronary angioplasty with stent placement  2004; 2012    "I have 2 stents" (08/04/2013)  . Coronary angioplasty with stent placement  09/28/13    PTCA/ DES Xience stent to VG-Diag   . Cardiac catheterization N/A 12/12/2014    Procedure: Left Heart Cath and Cors/Grafts Angiography;  Surgeon: Peter M Martinique, MD;  Location: [place] CV LAB;  Service: Cardiovascular;  Laterality: N/A;  . Cardiac catheterization N/A 12/12/2014    Procedure: Coronary Stent Intervention;  Surgeon: Peter M Martinique, MD;  Location: [facility] CV LAB;  Service: Cardiovascular;  Laterality: N/A;    Allergies  Allergen Reactions  . Hydralazine Shortness Of Breath  . Penicillins Cross Reactors Hives    And high fever  . Adhesive [Tape] Rash    bruising    I have reviewed the patient's current medications . allopurinol  300 mg Oral Daily  . amLODipine  10 mg Oral Daily  . aspirin  325 mg Oral Daily  . atorvastatin  40 mg Oral Daily  . cloNIDine  0.2 mg Oral BID  . clopidogrel  75 mg Oral Daily  . doxazosin  2 mg Oral QHS  . enoxaparin (LOVENOX) injection  40 mg Subcutaneous Q24H  . ezetimibe  10 mg Oral Daily  . gabapentin  400 mg Oral TID  . insulin aspart  0-15 Units Subcutaneous TID WC  . insulin aspart  0-5 Units Subcutaneous QHS  .  insulin glargine  20 Units Subcutaneous QHS  . isosorbide mononitrate  90 mg Oral Daily  . levothyroxine  25 mcg Oral QAC breakfast  . metoprolol succinate  150 mg Oral Daily  . traZODone  25 mg Oral QHS     acetaminophen, albuterol, ALPRAZolam, cloNIDine, fluticasone, gi cocktail, morphine injection, ondansetron (ZOFRAN) IV  Prior to Admission medications   Medication Sig Start Date End Date Taking? Authorizing Provider  acetaminophen-codeine (TYLENOL #3)  300-30 MG tablet Take 1 tablet by mouth every 4 (four) hours as needed for moderate pain. 04/07/15  Yes Lance Bosch, NP  albuterol (PROVENTIL HFA;VENTOLIN HFA) 108 (90 BASE) MCG/ACT inhaler Inhale 2 puffs into the lungs every 6 (six) hours as needed for wheezing or shortness of breath. 08/11/14  Yes Tresa Garter, MD  allopurinol (ZYLOPRIM) 300 MG tablet Take 1 tablet (300 mg total) by mouth daily. 08/11/14  Yes Tresa Garter, MD  amLODipine (NORVASC) 10 MG tablet Take 1 tablet (10 mg total) by mouth daily. 12/15/14  Yes Erlene Quan, PA-C  aspirin 81 MG chewable tablet Chew 1 tablet (81 mg total) by mouth daily. 02/24/15  Yes Mihai Croitoru, MD  atorvastatin (LIPITOR) 40 MG tablet Take 1 tablet (40 mg total) by mouth daily. 06/16/15  Yes Tresa Garter, MD  cloNIDine (CATAPRES) 0.2 MG tablet Take 1 tablet (0.2 mg total) by mouth 2 (two) times daily. 05/16/15  Yes Tresa Garter, MD  clopidogrel (PLAVIX) 75 MG tablet Take 1 tablet (75 mg total) by mouth daily. 12/28/14  Yes Mihai Croitoru, MD  cyclobenzaprine (FLEXERIL) 10 MG tablet Take 1 tablet (10 mg total) by mouth 3 (three) times daily as needed for muscle spasms. 01/27/14  Yes Tresa Garter, MD  doxazosin (CARDURA) 2 MG tablet Take 1 tablet (2 mg total) by mouth at bedtime. 12/15/14  Yes Luke K Kilroy, PA-C  ezetimibe (ZETIA) 10 MG tablet Take 1 tablet (10 mg total) by mouth daily. 06/06/15  Yes Mihai Croitoru, MD  fluticasone (FLONASE) 50 MCG/ACT nasal spray Place  2 sprays into both nostrils daily as needed for allergies or rhinitis. 08/11/14  Yes Tresa Garter, MD  furosemide (LASIX) 40 MG tablet Take 1 tablet (40 mg total) by mouth daily. 12/15/14  Yes Erlene Quan, PA-C  gabapentin (NEURONTIN) 400 MG capsule take 1 capsule by mouth three times a day 02/16/15  Yes Olugbemiga E Doreene Burke, MD  insulin aspart (NOVOLOG FLEXPEN) 100 UNIT/ML FlexPen Inject 10-18 units into the skin 3 times daily, plus sliding scale as instructed. 03/31/15  Yes Philemon Kingdom, MD  Insulin Glargine (TOUJEO SOLOSTAR) 300 UNIT/ML SOPN Inject 40 Units into the skin at bedtime. 03/28/15  Yes Philemon Kingdom, MD  Insulin Pen Needle 31G X 5 MM MISC Use to inject insulin 4 times daily as instructed. 02/16/15  Yes Philemon Kingdom, MD  Insulin Syringes, Disposable, (B-D INSULIN SYRINGE 1CC) U-100 1 ML MISC Use to inject insulin 4 times daily as instructed. 02/22/14  Yes Philemon Kingdom, MD  isosorbide mononitrate (IMDUR) 60 MG 24 hr tablet Take 1.5 tablets (90 mg total) by mouth daily. 11/10/14  Yes Tresa Garter, MD  levothyroxine (SYNTHROID, LEVOTHROID) 25 MCG tablet take 1 tablet by mouth every morning BEFORE BREAKFAST 03/27/15  Yes Olugbemiga E Doreene Burke, MD  lisinopril (PRINIVIL,ZESTRIL) 10 MG tablet Take 1 tablet (10 mg total) by mouth daily. 01/14/15  Yes Mihai Croitoru, MD  loratadine (CLARITIN) 10 MG tablet Take 1 tablet (10 mg total) by mouth daily as needed for allergies. 12/15/14  Yes Luke K Kilroy, PA-C  Menthol-Camphor (TIGER BALM ARTHRITIS RUB) 11-11 % CREA Apply 1 application topically 2 (two) times daily.   Yes Historical Provider, MD  metoprolol succinate (TOPROL-XL) 50 MG 24 hr tablet Take 3 tablets (150 mg total) by mouth daily. Take with or immediately following a meal. 01/25/14  Yes Brett Canales, PA-C  traZODone (DESYREL) 50 MG tablet Take 0.5 tablets (25 mg  total) by mouth at bedtime. 05/16/14  Yes Tresa Garter, MD  triamcinolone cream (KENALOG) 0.1 % Apply 1  application topically 2 (two) times daily. 02/23/15  Yes Tresa Garter, MD  nitroGLYCERIN (NITROSTAT) 0.4 MG SL tablet Place 1 tablet (0.4 mg total) under the tongue every 5 (five) minutes as needed. For chest 11/10/14   Tresa Garter, MD     Social History   Social History  . Marital Status: Divorced    Spouse Name: N/A  . Number of Children: N/A  . Years of Education: N/A   Occupational History  . Not on file.   Social History Main Topics  . Smoking status: Former Smoker -- 1.00 packs/day for 25 years    Types: Cigarettes    Quit date: 04/04/2000  . Smokeless tobacco: Never Used  . Alcohol Use: Yes     Comment: 12/12/2014  "have a glass of red wine on my birthday q yr; that's it"  . Drug Use: No  . Sexual Activity: Not Currently    Birth Control/ Protection: Abstinence   Other Topics Concern  . Not on file   Social History Narrative    Family Status  Relation Status Death Age  . Mother Deceased   . Father Deceased   . Sister Alive   . Brother Alive   . Maternal Grandmother Deceased   . Maternal Grandfather Deceased   . Paternal Grandmother Deceased   . Paternal Grandfather Deceased    Family History  Problem Relation Age of Onset  . Diabetes Mother   . Hypertension Mother   . Stroke Mother   . Hypertension Father   . Hypertension Brother   . Hypertension Sister   . Diabetes Sister   . Hyperlipidemia Sister      ROS:  Full 14 point review of systems complete and found to be negative unless listed above.  Physical Exam: Blood pressure 146/72, pulse 64, temperature 98.5 F (36.9 C), temperature source Oral, resp. rate 17, height '5\' 10"'  (1.778 m), weight 249 lb 12.5 oz (113.3 kg), last menstrual period 10/13/2014, SpO2 97 %.  General: Well developed, well nourished,obese female in no acute distress Head: Eyes PERRLA, No xanthomas.   Normocephalic and atraumatic, oropharynx without edema or exudate. Lungs: CTA Heart: HRRR S1 S2, no rub/gallop,  No  murmur. pulses are 2+ extrem.   Neck: No carotid bruits. No lymphadenopathy.  No JVD. Abdomen: Bowel sounds present, abdomen tight and firm, non-tender without masses or hernias noted. Msk:  No spine or cva tenderness. No weakness, no joint deformities or effusions. Extremities: No clubbing or cyanosis. Generalized edema, Mild +1 edema in ankles.  Neuro: Alert and oriented X 3. No focal deficits noted. Psych:  Good affect, responds appropriately Skin: No rashes or lesions noted.  Labs:   Lab Results  Component Value Date   WBC 5.8 07/25/2015   HGB 10.4* 07/25/2015   HCT 30.1* 07/25/2015   MCV 91.8 07/25/2015   PLT 203 07/25/2015   Recent Labs Lab 07/26/15 0326  NA 141  K 4.2  CL 106  CO2 26  BUN 27*  CREATININE 2.54*  CALCIUM 9.3  GLUCOSE 209*   MAGNESIUM  Date Value Ref Range Status  11/08/2013 1.7 1.5 - 2.5 mg/dL Final    Recent Labs  07/25/15 2037 07/26/15 0326  TROPONINI <0.03 <0.03    Recent Labs  07/25/15 1130 07/25/15 1418  TROPIPOC 0.01 0.02   B NATRIURETIC PEPTIDE  Date/Time Value Ref Range  Status  07/25/2015 04:10 PM 339.5* 0.0 - 100.0 pg/mL Final   Lab Results  Component Value Date   CHOL 142 07/25/2015   HDL 39* 07/25/2015   LDLCALC 59 07/25/2015   TRIG 221* 07/25/2015    Echo: August 2016 - Left ventricle: The cavity size was normal. Wall thickness was  normal. Systolic function was moderately reduced. The estimated  ejection fraction was in the range of 35% to 40%. Diffuse  hypokinesis. Features are consistent with a pseudonormal left  ventricular filling pattern, with concomitant abnormal relaxation  and increased filling pressure (grade 2 diastolic dysfunction). - Mitral valve: There was mild to moderate regurgitation. - Left atrium: The atrium was mildly dilated. - Right atrium: The atrium was mildly dilated.  ECG:  NSR, prolonged QT  Cath June 2015:  Coronary angiography: Coronary dominance: right  Left mainstem: The  left main is calcified with 50% distal left main stenosis.  Left anterior descending (LAD): The LAD is diffusely diseased in the proximal vessel up to 80%. There is an 80-90% stenosis in the mid vessel. The LAD fills by LIMA.   The ramus intermediate artery has an 80-90% stenosis proximally.   Left circumflex (LCx): The LCx gives rise to a single OM and is without significant disease.   Right coronary artery (RCA): The RCA has a long segment of 70% disease in the mid vessel. There is a long segmental 95% stenosis in the distal RCA.  The SVG to the PDA is widely patent and supplies the PLOM. There is mild disease in the mid vein graft to 30%.   The SVG to the intermediate is chronically occluded.  The SVG to the diagonal has a 90% stenosis in the proximal body of the graft.   The LIMA to the LAD is widely patent.   Left ventriculography: Not done.  PCI Note: Following the diagnostic procedure, the decision was made to proceed with PCI of the SVG to the diagonal. This was felt to be her culprit vessel. Weight-based bivalirudin was given for anticoagulation. Once a therapeutic ACT was achieved, a 6 [place] LA1 guide catheter was inserted. A filter 3.5-5.0 coronary guidewire was used to cross the lesion and the filter deployed in the distal vein graft. The lesion was predilated with a 2.5 mm balloon. The lesion was then stented with a 3.5 x 23 mm Xience Alpine stent depolyed at 12 atm with the stent balloon. The filter was then retrieved without difficulty. Following PCI, there was 0% residual stenosis and TIMI-3 flow. Final angiography confirmed an excellent result. The patient tolerated the procedure well. There were no immediate procedural complications. A TR band was used for radial hemostasis. The patient was transferred to the post catheterization recovery area for further monitoring.  Final Conclusions:  1. Severe 3 vessel obstructive CAD 2. Patent LIMA to the LAD. 3. Patent SVG  to PDA 4. Patent but severely stenosed SVG to the diagonal. 5. Chronic occlusion of the SVG to the intermediate.  6. Successful stenting of the SVG to the diagonal with a DES.   Cath 12/12/14 1. Severe 3 vessel obstructive CAD 2. Patent LIMA to LAD 3. Patent SVG to first diagonal- it also fills second diagonal. Stent in SVG is patent. 4. Patent SVG to PDA 5. Occluded SVG to ramus intermediate. Chronic 6. Successful stenting of the proximal ramus intermediate with a DES.  7. Elevated LV EDP (33 mm Hg)  Radiology:  Dg Chest 2 View  07/25/2015  CLINICAL DATA:  SOB since last night, face feels swollen, pain in the arms, neck and face, slight CP at times, dry cough for a little while now - hx of exsmoker, PNA in past, CHF, diabetes, htn, asthma, heart surgery, 3 stents, MI EXAM: CHEST  2 VIEW COMPARISON:  Report of 01/25/2014, images not available. FINDINGS: Prior median sternotomy. Lower thoracic spondylosis. Midline trachea. Mild cardiomegaly, accentuated by low lung volumes. No pleural effusion or pneumothorax. Pulmonary interstitial prominence could be due to low lung volumes. Left mid lung scarring with left greater than right base volume loss. IMPRESSION: Cardiomegaly and low lung volumes. Pulmonary interstitial prominence could be secondary low lung volumes or mild pulmonary venous congestion. No overt congestive failure. Left mid lung scarring with bibasilar volume loss. Electronically Signed   By: Abigail Miyamoto M.D.   On: 07/25/2015 12:18    ASSESSMENT AND PLAN:    Principal Problem:   Chest pain at rest Active Problems:   HTN (hypertension), malignant, patent renal arteries   CAD -S/P PCI June 2015 and 12/14/14   Hypothyroidism   Diabetic neuropathy, type II diabetes mellitus ([place])   Acute kidney injury superimposed on chronic kidney disease ([place])   Creatinine elevation   Chronic combined systolic and diastolic heart failure ([place])  1. Chest pain: Currently pain free.  No EKG  changes, troponin negative x2.  We will follow troponin. She denies any pain since being admitted.  No ischemic evaluation is needed. We can consider increasing her isosorbide to 11m daily.   2. Chronic combined systolic and diastolic CHF: Last echo shows EF of 35-40% in August 2016, she will need repeat echo this admission. She has been taking 429mLasix at home and says she doesn't feel like its "working all that well" anymore.  Her belly is tight and she feels SOB with activity, like walking to the bathroom.  Today, her creatinine is 2.54. MD to advise on IV diuresis.  She appears to have a baseline creatinine of 1.6-1.7. No ACEI or ARB. BNP is 339.5. There is no urine output recorded. Her dry weight appears to be about 235 pounds, she is 249 today.    3. HTN: She is hypertensive with SBP in the 140's but as high as 170's.  She is allergic to hydralazine.  We can increase her isosorbide to 12059maily.    Signed: EriArbutus LeasP 07/26/2015 9:01 AM Pager [phone]-Sign MD

## 2015-07-26 NOTE — Care Management Obs Status (Signed)
[place] NOTIFICATION   Patient Details  Name: LASHAUNTE TUMBLESON MRN: [id number] Date of Birth: [birth year]   Medicare Observation Status Notification Given:  Yes    Dawayne Patricia, RN 07/26/2015, 11:45 AM

## 2015-07-27 ENCOUNTER — Observation Stay (HOSPITAL_BASED_OUTPATIENT_CLINIC_OR_DEPARTMENT_OTHER): Payer: Medicare Other

## 2015-07-27 ENCOUNTER — Other Ambulatory Visit (HOSPITAL_COMMUNITY): Payer: Medicare Other

## 2015-07-27 ENCOUNTER — Ambulatory Visit: Payer: Medicare Other | Admitting: Internal Medicine

## 2015-07-27 DIAGNOSIS — N184 Chronic kidney disease, stage 4 (severe): Secondary | ICD-10-CM | POA: Diagnosis not present

## 2015-07-27 DIAGNOSIS — I509 Heart failure, unspecified: Secondary | ICD-10-CM | POA: Diagnosis not present

## 2015-07-27 DIAGNOSIS — I2510 Atherosclerotic heart disease of native coronary artery without angina pectoris: Secondary | ICD-10-CM | POA: Diagnosis not present

## 2015-07-27 DIAGNOSIS — E785 Hyperlipidemia, unspecified: Secondary | ICD-10-CM | POA: Diagnosis not present

## 2015-07-27 DIAGNOSIS — N189 Chronic kidney disease, unspecified: Secondary | ICD-10-CM

## 2015-07-27 DIAGNOSIS — I5041 Acute combined systolic (congestive) and diastolic (congestive) heart failure: Secondary | ICD-10-CM | POA: Diagnosis not present

## 2015-07-27 DIAGNOSIS — N179 Acute kidney failure, unspecified: Secondary | ICD-10-CM | POA: Diagnosis not present

## 2015-07-27 DIAGNOSIS — E119 Type 2 diabetes mellitus without complications: Secondary | ICD-10-CM | POA: Diagnosis not present

## 2015-07-27 DIAGNOSIS — R0789 Other chest pain: Secondary | ICD-10-CM | POA: Diagnosis not present

## 2015-07-27 DIAGNOSIS — I129 Hypertensive chronic kidney disease with stage 1 through stage 4 chronic kidney disease, or unspecified chronic kidney disease: Secondary | ICD-10-CM | POA: Diagnosis not present

## 2015-07-27 DIAGNOSIS — R079 Chest pain, unspecified: Secondary | ICD-10-CM | POA: Diagnosis not present

## 2015-07-27 DIAGNOSIS — N2581 Secondary hyperparathyroidism of renal origin: Secondary | ICD-10-CM | POA: Diagnosis not present

## 2015-07-27 LAB — GLUCOSE, CAPILLARY
GLUCOSE-CAPILLARY: 219 mg/dL — AB (ref 65–99)
GLUCOSE-CAPILLARY: 193 mg/dL — AB (ref 65–99)
GLUCOSE-CAPILLARY: 244 mg/dL — AB (ref 65–99)
Glucose-Capillary: 249 mg/dL — ABNORMAL HIGH (ref 65–99)

## 2015-07-27 LAB — CBC
HEMATOCRIT: 29.5 % — AB (ref 36.0–46.0)
HEMOGLOBIN: 9.5 g/dL — AB (ref 12.0–15.0)
MCH: 30.1 pg (ref 26.0–34.0)
MCHC: 32.2 g/dL (ref 30.0–36.0)
MCV: 93.4 fL (ref 78.0–100.0)
Platelets: 172 10*3/uL (ref 150–400)
RBC: 3.16 MIL/uL — ABNORMAL LOW (ref 3.87–5.11)
RDW: 13.9 % (ref 11.5–15.5)
WBC: 5.1 10*3/uL (ref 4.0–10.5)

## 2015-07-27 LAB — ECHOCARDIOGRAM COMPLETE
Height: 70 in
Weight: 4030.4 oz

## 2015-07-27 LAB — BASIC METABOLIC PANEL
ANION GAP: 10 (ref 5–15)
BUN: 36 mg/dL — AB (ref 6–20)
CALCIUM: 9.2 mg/dL (ref 8.9–10.3)
CO2: 25 mmol/L (ref 22–32)
Chloride: 105 mmol/L (ref 101–111)
Creatinine, Ser: 2.72 mg/dL — ABNORMAL HIGH (ref 0.44–1.00)
GFR calc Af Amer: 21 mL/min — ABNORMAL LOW (ref >60)
GFR, EST NON AFRICAN AMERICAN: 18 mL/min — AB (ref >60)
GLUCOSE: 209 mg/dL — AB (ref 65–99)
Potassium: 4.1 mmol/L (ref 3.5–5.1)
Sodium: 140 mmol/L (ref 135–145)

## 2015-07-27 LAB — HEMOGLOBIN A1C
HEMOGLOBIN A1C: 7.2 % — AB (ref 4.8–5.6)
MEAN PLASMA GLUCOSE: 160 mg/dL

## 2015-07-27 MED ORDER — MAGNESIUM CITRATE PO SOLN: 1.0000 | Freq: Once | ORAL | Status: DC

## 2015-07-27 MED ORDER — ALBUTEROL SULFATE (2.5 MG/3ML) 0.083% IN NEBU: 2.5000 mg | INHALATION_SOLUTION | Freq: Four times a day (QID) | RESPIRATORY_TRACT | Status: DC | PRN

## 2015-07-27 MED ORDER — ASPIRIN EC 81 MG PO TBEC
81.0000 mg | DELAYED_RELEASE_TABLET | Freq: Every day | ORAL | Status: DC
  Administered 2015-07-27 – 2015-07-28 (×2): 81 mg via ORAL
  Filled 2015-07-27 (×2): qty 1

## 2015-07-27 MED ORDER — CLONIDINE HCL 0.2 MG PO TABS
0.3000 mg | ORAL_TABLET | Freq: Two times a day (BID) | ORAL | Status: DC
  Administered 2015-07-27 – 2015-07-28 (×3): 0.3 mg via ORAL
  Filled 2015-07-27 (×3): qty 1

## 2015-07-27 MED ORDER — DOCUSATE SODIUM 100 MG PO CAPS
100.0000 mg | ORAL_CAPSULE | Freq: Two times a day (BID) | ORAL | Status: DC
  Administered 2015-07-27 (×2): 100 mg via ORAL
  Filled 2015-07-27 (×3): qty 1

## 2015-07-27 MED ORDER — INSULIN GLARGINE 100 UNIT/ML ~~LOC~~ SOLN
30.0000 [IU] | Freq: Every day | SUBCUTANEOUS | Status: DC
  Administered 2015-07-27: 30 [IU] via SUBCUTANEOUS
  Filled 2015-07-27 (×2): qty 0.3

## 2015-07-27 MED ORDER — BISACODYL 10 MG RE SUPP
10.0000 mg | Freq: Once | RECTAL | Status: AC
  Administered 2015-07-27: 10 mg via RECTAL
  Filled 2015-07-27: qty 1

## 2015-07-27 NOTE — Progress Notes (Signed)
Echocardiogram 2D Echocardiogram has been performed.  07/27/2015 12:55 PM Maudry Mayhew, RVT, RDCS, RDMS

## 2015-07-27 NOTE — Progress Notes (Signed)
Inpatient Diabetes Program Recommendations  AACE/ADA: New Consensus Statement on Inpatient Glycemic Control (2015)  Target Ranges:  Prepandial:   less than 140 mg/dL      Peak postprandial:   less than 180 mg/dL (1-2 hours)      Critically ill patients:  140 - 180 mg/dL  Results for Brooke Stafford, Brooke Stafford (MRN [id number]) as of 07/27/2015 10:25  Ref. Range 07/26/2015 06:23 07/26/2015 11:09 07/26/2015 16:25 07/26/2015 21:07 07/27/2015 06:09  Glucose-Capillary Latest Ref Range: 65-99 mg/dL 180 (H) 132 (H) 215 (H) 144 (H) 249 (H)   Review of Glycemic Control  Diabetes history: DM2 Outpatient Diabetes medications: Toujeo 40 units QAM, Novolog 10-18 units TID with meals Current orders for Inpatient glycemic control: Lantus 20 units QHS, Novolog 0-15 units TID with meals, Novolog 0-5 units QHS  Inpatient Diabetes Program Recommendations: Insulin - Basal: Please consider increasing Lantus to 23 units QHS. Insulin - Meal Coverage: Please consider ordering Novolog 3 units TID with meals if patient eats at least 50% of meals.   Thanks, Barnie Alderman, RN, MSN, CDE Diabetes Coordinator Inpatient Diabetes Program [phone] (Team Pager from [place] to [place]) [phone] ([facility] office) [phone] [facility] office) [phone] [facility] office)

## 2015-07-27 NOTE — Consult Note (Signed)
[facility] Renal Consultation Note  Requesting MD: Rai Indication for Consultation: A on CRF  HPI:  Brooke Stafford is a 60 y.o. female with past medical history mostly significant for diabetes mellitus,hypertension, COPD, coronary artery disease with interventions as well as an ejection fraction of 35-40%.  She came to the emergency department on 4/11 and was admitted with shortness of breath chest pain, and overall body aches.  she was hypertensive. Felt to be slightly volume overloaded so was given IV Lasix with reasonable response. Regarding her kidney function, she has been followed by Dr. Marval Regal at [facility] for mild CKD creatinine from 1.6-1.9. She did see him in February at which time creatinine level was 2.2. She tells me today that in December she was started on a medication to help her kidneys lisinopril and she's been taking it every day since December. That was not noted during her appointment in February.  She now is in the process of changing over to Dr. Lorrene Reid because she preferred a female physician. On 4/11 creatinine was noted to be around 2.4. It increased to 2.5 on 4/12 and is 2.7 today so that is the reason for consultation. Patient reports she feels much better than upon admission but is having some dizziness upon standing. She denies any difficulty with urine output. She did take a BC powder on Monday but does not take them often. The other issue is that unfortunately today she learned that her son who lives in [place] was shot and has died.  She understands, however that her health is important and doesn't want to leave the hospital unless she knows that things are going to be okay with her.     CREAT  Date/Time Value Ref Range Status  12/06/2014 01:46 PM 1.49* 0.50 - 0.99 mg/dL Final  11/10/2013 12:48 PM 1.47* 0.50 - 1.10 mg/dL Final  06/02/2013 03:14 PM 1.67* 0.50 - 1.10 mg/dL Final  11/25/2012 12:02 PM 1.64* 0.50 - 1.10 mg/dL  Final   CREATININE, SER  Date/Time Value Ref Range Status  07/27/2015 02:32 AM 2.72* 0.44 - 1.00 mg/dL Final  07/26/2015 03:26 AM 2.54* 0.44 - 1.00 mg/dL Final  07/25/2015 11:25 AM 2.37* 0.44 - 1.00 mg/dL Final  12/15/2014 04:24 AM 1.61* 0.44 - 1.00 mg/dL Final  12/14/2014 03:30 AM 1.68* 0.44 - 1.00 mg/dL Final  12/13/2014 02:45 AM 1.73* 0.44 - 1.00 mg/dL Final  08/08/2014 02:45 PM 1.36* 0.57 - 1.00 mg/dL Final  01/25/2014 01:09 PM 1.38* 0.50 - 1.10 mg/dL Final  11/09/2013 04:20 PM 2.03* 0.50 - 1.10 mg/dL Final  11/09/2013 03:50 AM 1.81* 0.50 - 1.10 mg/dL Final  11/08/2013 04:18 PM 1.35* 0.50 - 1.10 mg/dL Final  09/29/2013 03:40 AM 1.21* 0.50 - 1.10 mg/dL Final  09/28/2013 05:50 AM 1.33* 0.50 - 1.10 mg/dL Final  09/27/2013 01:45 PM 1.32* 0.50 - 1.10 mg/dL Final  08/04/2013 04:30 AM 1.73* 0.50 - 1.10 mg/dL Final  08/03/2013 10:31 PM 1.73* 0.50 - 1.10 mg/dL Final  12/02/2012 05:00 AM 1.44* 0.50 - 1.10 mg/dL Final  12/01/2012 07:10 AM 1.36* 0.50 - 1.10 mg/dL Final  07/13/2012 06:52 PM 1.37* 0.50 - 1.10 mg/dL Final  04/06/2011 03:30 AM 1.32* 0.50 - 1.10 mg/dL Final  04/05/2011 11:38 AM 1.40* 0.50 - 1.10 mg/dL Final  01/09/2011 05:40 AM 1.27* 0.50 - 1.10 mg/dL Final  01/08/2011 05:45 AM 1.33* 0.50 - 1.10 mg/dL Final  01/06/2011 01:20 AM 1.35* 0.50 - 1.10 mg/dL Final  01/05/2011 09:47 PM 1.31* 0.50 -  1.10 mg/dL Final  01/05/2011 05:37 PM 1.38* 0.50 - 1.10 mg/dL Final  02/19/2010 09:14 PM 1.38* 0.40-1.20 mg/dL Final  11/27/2009 09:22 PM 1.31* 0.40-1.20 mg/dL Final  09/11/2009 05:10 AM 1.50* 0.4 - 1.2 mg/dL Final  09/10/2009 06:46 AM 1.58* 0.4 - 1.2 mg/dL Final  09/09/2009 05:15 AM 1.75* 0.4 - 1.2 mg/dL Final  09/08/2009 04:00 AM 1.54* 0.4 - 1.2 mg/dL Final  09/07/2009 04:00 AM 1.66* 0.4 - 1.2 mg/dL Final  09/06/2009 04:00 AM 1.88* 0.4 - 1.2 mg/dL Final  09/05/2009 04:21 AM 2.22* 0.4 - 1.2 mg/dL Final  09/04/2009 04:21 AM 2.56* 0.4 - 1.2 mg/dL Final  09/03/2009 06:00 AM 2.53* 0.4 -  1.2 mg/dL Final  09/03/2009 03:02 AM 1.7* 0.4 - 1.2 mg/dL Final  09/02/2009 11:52 PM 1.4* 0.4 - 1.2 mg/dL Final  09/02/2009 11:23 PM 1.63* 0.4 - 1.2 mg/dL Final  03/15/2008 09:03 PM 1.06 0.40-1.20 mg/dL Final  10/11/2007 06:49 PM 1.3*  Final  05/13/2007 02:04 PM 1.81*  Final  05/01/2007 09:32 PM 1.18 0.40-1.20 mg/dL Final  03/04/2007 08:12 PM 1.21* 0.40-1.20 mg/dL Final     PMHx:   Past Medical History  Diagnosis Date  . Asthma   . Anxiety   . GERD (gastroesophageal reflux disease)   . Coronary artery disease 2002    CABG x 6. Cath 5/11- med Rx  . Hypertension   . Peripheral vascular disease ([place]) 12/12    LSFA PTA  . Anemia   . Hyperlipidemia   . Chronic renal insufficiency, stage II (mild)     followed  by [facility]  . CAD (coronary artery disease) 2002; 2015    CABG x 6 2002, cath 2011- med Rx stent DES VG-Diag  . Hypothyroid     treated  . Obesity (BMI 35.0-39.9 without comorbidity) ([place])   . CHF (congestive heart failure) ([place])     "in 2002" (11/26/2012)  . Chronic bronchitis ([place])     "q year; in the winter"   . Type II diabetes mellitus ([place])   . History of blood transfusion 2002    "when I had OHS"  . Gout     "right big toe"  . Anginal pain ([place])   . CAD (coronary artery disease) of artery bypass graft; DES to VG-Diag 09/28/13 11/09/2013  . Myocardial infarction ([place]) 2000; 2002; 2011  . Pneumonia     "3 times I think" (12/12/2014)  . Migraines     "couple times/year" (08/04/2013), (12/12/2014)  . Headache     "~ q week" (08/04/2013); "~ twice/month" (12/12/2014)  . Arthritis     "stiff fingers and knees" (08/04/2013), (12/12/2014)    Past Surgical History  Procedure Laterality Date  . Cholecystectomy  1982  . Cesarean section  1978; 1980  . Tubal ligation  1980  . Breast cyst excision Right 1970's  . Coronary artery bypass graft  11/20/2000    x6 LIMA to distal LAD, svg to first diag, svg to ramus intermediate branch and swquential SVG to cir marginal  branch, SVG to posterior descending coronary and sequential SVG to first right posterolateral branch  . Peripheral arterial stent graft Left     SFA/notes 04/07/2011 (11/30/2012)  . Nm myocar perf wall motion  08/27/2004    negative  . Appendectomy  1980  . Abdominal aortagram N/A 04/05/2011    Procedure: ABDOMINAL AORTAGRAM;  Surgeon: Lorretta Harp, MD;  Location: [facility] CATH LAB;  Service: Cardiovascular;  Laterality: N/A;  . Renal angiogram N/A 04/05/2011  Procedure: RENAL ANGIOGRAM;  Surgeon: Lorretta Harp, MD;  Location: [facility] CATH LAB;  Service: Cardiovascular;  Laterality: N/A;  . Lower extremity angiogram  12/01/2012    Procedure: LOWER EXTREMITY ANGIOGRAM;  Surgeon: Lorretta Harp, MD;  Location: [facility] CATH LAB;  Service: Cardiovascular;;  . Percutaneous stent intervention Left 12/01/2012    Procedure: PERCUTANEOUS STENT INTERVENTION;  Surgeon: Lorretta Harp, MD;  Location: [facility] CATH LAB;  Service: Cardiovascular;  Laterality: Left;  Left SFA  . Left heart catheterization with coronary/graft angiogram N/A 09/28/2013    Procedure: LEFT HEART CATHETERIZATION WITH Beatrix Fetters;  Surgeon: Peter M Martinique, MD;  Location: [facility] CATH LAB;  Service: Cardiovascular;  Laterality: N/A;  . Cardiac catheterization  2002  . Coronary angioplasty with stent placement  2004; 2012    "I have 2 stents" (08/04/2013)  . Coronary angioplasty with stent placement  09/28/13    PTCA/ DES Xience stent to VG-Diag   . Cardiac catheterization N/A 12/12/2014    Procedure: Left Heart Cath and Cors/Grafts Angiography;  Surgeon: Peter M Martinique, MD;  Location: [place] CV LAB;  Service: Cardiovascular;  Laterality: N/A;  . Cardiac catheterization N/A 12/12/2014    Procedure: Coronary Stent Intervention;  Surgeon: Peter M Martinique, MD;  Location: [place] CV LAB;  Service: Cardiovascular;  Laterality: N/A;    Family Hx:  Family History  Problem Relation Age of Onset  . Diabetes Mother   . Hypertension  Mother   . Stroke Mother   . Hypertension Father   . Hypertension Brother   . Hypertension Sister   . Diabetes Sister   . Hyperlipidemia Sister     Social History:  reports that she quit smoking about 15 years ago. Her smoking use included Cigarettes. She has a 25 pack-year smoking history. She has never used smokeless tobacco. She reports that she drinks alcohol. She reports that she does not use illicit drugs.  Allergies:  Allergies  Allergen Reactions  . Hydralazine Shortness Of Breath  . Penicillins Cross Reactors Hives    And high fever  . Adhesive [Tape] Rash    bruising    Medications: Prior to Admission medications   Medication Sig Start Date End Date Taking? Authorizing Provider  acetaminophen-codeine (TYLENOL #3) 300-30 MG tablet Take 1 tablet by mouth every 4 (four) hours as needed for moderate pain. 04/07/15  Yes Lance Bosch, NP  albuterol (PROVENTIL HFA;VENTOLIN HFA) 108 (90 BASE) MCG/ACT inhaler Inhale 2 puffs into the lungs every 6 (six) hours as needed for wheezing or shortness of breath. 08/11/14  Yes Tresa Garter, MD  allopurinol (ZYLOPRIM) 300 MG tablet Take 1 tablet (300 mg total) by mouth daily. 08/11/14  Yes Tresa Garter, MD  amLODipine (NORVASC) 10 MG tablet Take 1 tablet (10 mg total) by mouth daily. 12/15/14  Yes Erlene Quan, PA-C  aspirin 81 MG chewable tablet Chew 1 tablet (81 mg total) by mouth daily. 02/24/15  Yes Mihai Croitoru, MD  atorvastatin (LIPITOR) 40 MG tablet Take 1 tablet (40 mg total) by mouth daily. 06/16/15  Yes Tresa Garter, MD  cloNIDine (CATAPRES) 0.2 MG tablet Take 1 tablet (0.2 mg total) by mouth 2 (two) times daily. 05/16/15  Yes Tresa Garter, MD  clopidogrel (PLAVIX) 75 MG tablet Take 1 tablet (75 mg total) by mouth daily. 12/28/14  Yes Mihai Croitoru, MD  cyclobenzaprine (FLEXERIL) 10 MG tablet Take 1 tablet (10 mg total) by mouth 3 (three) times daily as needed for  muscle spasms. 01/27/14  Yes Tresa Garter, MD  doxazosin (CARDURA) 2 MG tablet Take 1 tablet (2 mg total) by mouth at bedtime. 12/15/14  Yes Luke K Kilroy, PA-C  ezetimibe (ZETIA) 10 MG tablet Take 1 tablet (10 mg total) by mouth daily. 06/06/15  Yes Mihai Croitoru, MD  fluticasone (FLONASE) 50 MCG/ACT nasal spray Place 2 sprays into both nostrils daily as needed for allergies or rhinitis. 08/11/14  Yes Tresa Garter, MD  furosemide (LASIX) 40 MG tablet Take 1 tablet (40 mg total) by mouth daily. 12/15/14  Yes Erlene Quan, PA-C  gabapentin (NEURONTIN) 400 MG capsule take 1 capsule by mouth three times a day 02/16/15  Yes Olugbemiga E Doreene Burke, MD  insulin aspart (NOVOLOG FLEXPEN) 100 UNIT/ML FlexPen Inject 10-18 units into the skin 3 times daily, plus sliding scale as instructed. 03/31/15  Yes Philemon Kingdom, MD  Insulin Glargine (TOUJEO SOLOSTAR) 300 UNIT/ML SOPN Inject 40 Units into the skin at bedtime. 03/28/15  Yes Philemon Kingdom, MD  Insulin Pen Needle 31G X 5 MM MISC Use to inject insulin 4 times daily as instructed. 02/16/15  Yes Philemon Kingdom, MD  Insulin Syringes, Disposable, (B-D INSULIN SYRINGE 1CC) U-100 1 ML MISC Use to inject insulin 4 times daily as instructed. 02/22/14  Yes Philemon Kingdom, MD  isosorbide mononitrate (IMDUR) 60 MG 24 hr tablet Take 1.5 tablets (90 mg total) by mouth daily. 11/10/14  Yes Tresa Garter, MD  levothyroxine (SYNTHROID, LEVOTHROID) 25 MCG tablet take 1 tablet by mouth every morning BEFORE BREAKFAST 03/27/15  Yes Olugbemiga E Doreene Burke, MD  lisinopril (PRINIVIL,ZESTRIL) 10 MG tablet Take 1 tablet (10 mg total) by mouth daily. 01/14/15  Yes Mihai Croitoru, MD  loratadine (CLARITIN) 10 MG tablet Take 1 tablet (10 mg total) by mouth daily as needed for allergies. 12/15/14  Yes Luke K Kilroy, PA-C  Menthol-Camphor (TIGER BALM ARTHRITIS RUB) 11-11 % CREA Apply 1 application topically 2 (two) times daily.   Yes Historical Provider, MD  metoprolol succinate (TOPROL-XL) 50 MG 24 hr tablet Take 3  tablets (150 mg total) by mouth daily. Take with or immediately following a meal. 01/25/14  Yes Brett Canales, PA-C  traZODone (DESYREL) 50 MG tablet Take 0.5 tablets (25 mg total) by mouth at bedtime. 05/16/14  Yes Tresa Garter, MD  triamcinolone cream (KENALOG) 0.1 % Apply 1 application topically 2 (two) times daily. 02/23/15  Yes Tresa Garter, MD  nitroGLYCERIN (NITROSTAT) 0.4 MG SL tablet Place 1 tablet (0.4 mg total) under the tongue every 5 (five) minutes as needed. For chest 11/10/14   Tresa Garter, MD    I have reviewed the patient's current medications.  Labs:  Results for orders placed or performed during the hospital encounter of 07/25/15 (from the past 48 hour(s))  Basic metabolic panel     Status: Abnormal   Collection Time: 07/25/15 11:25 AM  Result Value Ref Range   Sodium 144 135 - 145 mmol/L   Potassium 3.9 3.5 - 5.1 mmol/L   Chloride 107 101 - 111 mmol/L   CO2 27 22 - 32 mmol/L   Glucose, Bld 161 (H) 65 - 99 mg/dL   BUN 25 (H) 6 - 20 mg/dL   Creatinine, Ser 2.37 (H) 0.44 - 1.00 mg/dL   Calcium 9.6 8.9 - 10.3 mg/dL   GFR calc non Af Amer 21 (L) >60 mL/min   GFR calc Af Amer 24 (L) >60 mL/min    Comment: (NOTE) The  eGFR has been calculated using the CKD EPI equation. This calculation has not been validated in all clinical situations. eGFR's persistently <60 mL/min signify possible Chronic Kidney Disease.    Anion gap 10 5 - 15  CBC     Status: Abnormal   Collection Time: 07/25/15 11:25 AM  Result Value Ref Range   WBC 5.8 4.0 - 10.5 K/uL   RBC 3.28 (L) 3.87 - 5.11 MIL/uL   Hemoglobin 10.4 (L) 12.0 - 15.0 g/dL   HCT 30.1 (L) 36.0 - 46.0 %   MCV 91.8 78.0 - 100.0 fL   MCH 31.7 26.0 - 34.0 pg   MCHC 34.6 30.0 - 36.0 g/dL   RDW 14.1 11.5 - 15.5 %   Platelets 203 150 - 400 K/uL  I-stat troponin, ED     Status: None   Collection Time: 07/25/15 11:30 AM  Result Value Ref Range   Troponin i, poc 0.01 0.00 - 0.08 ng/mL   Comment 3             Comment: Due to the release kinetics of cTnI, a negative result within the first hours of the onset of symptoms does not rule out myocardial infarction with certainty. If myocardial infarction is still suspected, repeat the test at appropriate intervals.   I-Stat Troponin, ED (not at [facility])     Status: None   Collection Time: 07/25/15  2:18 PM  Result Value Ref Range   Troponin i, poc 0.02 0.00 - 0.08 ng/mL   Comment 3            Comment: Due to the release kinetics of cTnI, a negative result within the first hours of the onset of symptoms does not rule out myocardial infarction with certainty. If myocardial infarction is still suspected, repeat the test at appropriate intervals.   Brain natriuretic peptide     Status: Abnormal   Collection Time: 07/25/15  4:10 PM  Result Value Ref Range   B Natriuretic Peptide 339.5 (H) 0.0 - 100.0 pg/mL  Influenza panel by PCR (type A & B, H1N1)     Status: None   Collection Time: 07/25/15  6:50 PM  Result Value Ref Range   Influenza A By PCR NEGATIVE NEGATIVE   Influenza B By PCR NEGATIVE NEGATIVE   H1N1 flu by pcr NOT DETECTED NOT DETECTED    Comment:        The Xpert Flu assay (FDA approved for nasal aspirates or washes and nasopharyngeal swab specimens), is intended as an aid in the diagnosis of influenza and should not be used as a sole basis for treatment.   Troponin I     Status: None   Collection Time: 07/25/15  8:37 PM  Result Value Ref Range   Troponin I <0.03 <0.031 ng/mL    Comment:        NO INDICATION OF MYOCARDIAL INJURY.   Lipid panel     Status: Abnormal   Collection Time: 07/25/15  8:37 PM  Result Value Ref Range   Cholesterol 142 0 - 200 mg/dL   Triglycerides 221 (H) <150 mg/dL   HDL 39 (L) >40 mg/dL   Total CHOL/HDL Ratio 3.6 RATIO   VLDL 44 (H) 0 - 40 mg/dL   LDL Cholesterol 59 0 - 99 mg/dL    Comment:        Total Cholesterol/HDL:CHD Risk Coronary Heart Disease Risk Table  Men   Women   1/2 Average Risk   3.4   3.3  Average Risk       5.0   4.4  2 X Average Risk   9.6   7.1  3 X Average Risk  23.4   11.0        Use the calculated Patient Ratio above and the CHD Risk Table to determine the patient's CHD Risk.        ATP III CLASSIFICATION (LDL):  <100     mg/dL   Optimal  100-129  mg/dL   Near or Above                    Optimal  130-159  mg/dL   Borderline  160-189  mg/dL   High  >190     mg/dL   Very High   TSH     Status: None   Collection Time: 07/25/15  8:37 PM  Result Value Ref Range   TSH 3.124 0.350 - 4.500 uIU/mL  Hemoglobin A1c     Status: Abnormal   Collection Time: 07/25/15  8:37 PM  Result Value Ref Range   Hgb A1c MFr Bld 7.2 (H) 4.8 - 5.6 %    Comment: (NOTE)         Pre-diabetes: 5.7 - 6.4         Diabetes: >6.4         Glycemic control for adults with diabetes: <7.0    Mean Plasma Glucose 160 mg/dL    Comment: (NOTE) Performed At: [facility] [place], [place] [id number] Lindon Romp MD [id number]   Glucose, capillary     Status: Abnormal   Collection Time: 07/25/15  8:39 PM  Result Value Ref Range   Glucose-Capillary 213 (H) 65 - 99 mg/dL   Comment 1 Notify RN    Comment 2 Document in Chart   Urine rapid drug screen (hosp performed)     Status: Abnormal   Collection Time: 07/25/15 11:26 PM  Result Value Ref Range   Opiates POSITIVE (A) NONE DETECTED   Cocaine NONE DETECTED NONE DETECTED   Benzodiazepines NONE DETECTED NONE DETECTED   Amphetamines NONE DETECTED NONE DETECTED   Tetrahydrocannabinol NONE DETECTED NONE DETECTED   Barbiturates NONE DETECTED NONE DETECTED    Comment:        DRUG SCREEN FOR MEDICAL PURPOSES ONLY.  IF CONFIRMATION IS NEEDED FOR ANY PURPOSE, NOTIFY LAB WITHIN 5 DAYS.        LOWEST DETECTABLE LIMITS FOR URINE DRUG SCREEN Drug Class       Cutoff (ng/mL) Amphetamine      1000 Barbiturate      200 Benzodiazepine   151 Tricyclics       761 Opiates          300 Cocaine           300 THC              50   Urinalysis, Routine w reflex microscopic (not at [facility])     Status: Abnormal   Collection Time: 07/25/15 11:26 PM  Result Value Ref Range   Color, Urine YELLOW YELLOW   APPearance CLEAR CLEAR   Specific Gravity, Urine 1.015 1.005 - 1.030   pH 5.5 5.0 - 8.0   Glucose, UA 100 (A) NEGATIVE mg/dL   Hgb urine dipstick MODERATE (A) NEGATIVE   Bilirubin Urine NEGATIVE NEGATIVE   Ketones, ur NEGATIVE NEGATIVE mg/dL  Protein, ur >300 (A) NEGATIVE mg/dL   Nitrite NEGATIVE NEGATIVE   Leukocytes, UA NEGATIVE NEGATIVE  Urine microscopic-add on     Status: Abnormal   Collection Time: 07/25/15 11:26 PM  Result Value Ref Range   Squamous Epithelial / LPF 0-5 (A) NONE SEEN   WBC, UA 6-30 0 - 5 WBC/hpf   RBC / HPF 0-5 0 - 5 RBC/hpf   Bacteria, UA FEW (A) NONE SEEN   Casts HYALINE CASTS (A) NEGATIVE  Troponin I     Status: None   Collection Time: 07/26/15  3:26 AM  Result Value Ref Range   Troponin I <0.03 <0.031 ng/mL    Comment:        NO INDICATION OF MYOCARDIAL INJURY.   Basic metabolic panel     Status: Abnormal   Collection Time: 07/26/15  3:26 AM  Result Value Ref Range   Sodium 141 135 - 145 mmol/L   Potassium 4.2 3.5 - 5.1 mmol/L   Chloride 106 101 - 111 mmol/L   CO2 26 22 - 32 mmol/L   Glucose, Bld 209 (H) 65 - 99 mg/dL   BUN 27 (H) 6 - 20 mg/dL   Creatinine, Ser 2.54 (H) 0.44 - 1.00 mg/dL   Calcium 9.3 8.9 - 10.3 mg/dL   GFR calc non Af Amer 19 (L) >60 mL/min   GFR calc Af Amer 23 (L) >60 mL/min    Comment: (NOTE) The eGFR has been calculated using the CKD EPI equation. This calculation has not been validated in all clinical situations. eGFR's persistently <60 mL/min signify possible Chronic Kidney Disease.    Anion gap 9 5 - 15  Glucose, capillary     Status: Abnormal   Collection Time: 07/26/15  6:23 AM  Result Value Ref Range   Glucose-Capillary 180 (H) 65 - 99 mg/dL   Comment 1 Notify RN    Comment 2 Document in Chart   Glucose,  capillary     Status: Abnormal   Collection Time: 07/26/15 11:09 AM  Result Value Ref Range   Glucose-Capillary 132 (H) 65 - 99 mg/dL   Comment 1 Notify RN    Comment 2 Document in Chart   Sodium, urine, random     Status: None   Collection Time: 07/26/15  2:58 PM  Result Value Ref Range   Sodium, Ur 62 mmol/L  Glucose, capillary     Status: Abnormal   Collection Time: 07/26/15  4:25 PM  Result Value Ref Range   Glucose-Capillary 215 (H) 65 - 99 mg/dL   Comment 1 Notify RN    Comment 2 Document in Chart   Glucose, capillary     Status: Abnormal   Collection Time: 07/26/15  9:07 PM  Result Value Ref Range   Glucose-Capillary 144 (H) 65 - 99 mg/dL  Basic metabolic panel     Status: Abnormal   Collection Time: 07/27/15  2:32 AM  Result Value Ref Range   Sodium 140 135 - 145 mmol/L   Potassium 4.1 3.5 - 5.1 mmol/L   Chloride 105 101 - 111 mmol/L   CO2 25 22 - 32 mmol/L   Glucose, Bld 209 (H) 65 - 99 mg/dL   BUN 36 (H) 6 - 20 mg/dL   Creatinine, Ser 2.72 (H) 0.44 - 1.00 mg/dL   Calcium 9.2 8.9 - 10.3 mg/dL   GFR calc non Af Amer 18 (L) >60 mL/min   GFR calc Af Amer 21 (L) >60 mL/min    Comment: (  NOTE) The eGFR has been calculated using the CKD EPI equation. This calculation has not been validated in all clinical situations. eGFR's persistently <60 mL/min signify possible Chronic Kidney Disease.    Anion gap 10 5 - 15  CBC     Status: Abnormal   Collection Time: 07/27/15  2:32 AM  Result Value Ref Range   WBC 5.1 4.0 - 10.5 K/uL   RBC 3.16 (L) 3.87 - 5.11 MIL/uL   Hemoglobin 9.5 (L) 12.0 - 15.0 g/dL   HCT 29.5 (L) 36.0 - 46.0 %   MCV 93.4 78.0 - 100.0 fL   MCH 30.1 26.0 - 34.0 pg   MCHC 32.2 30.0 - 36.0 g/dL   RDW 13.9 11.5 - 15.5 %   Platelets 172 150 - 400 K/uL  Glucose, capillary     Status: Abnormal   Collection Time: 07/27/15  6:09 AM  Result Value Ref Range   Glucose-Capillary 249 (H) 65 - 99 mg/dL     ROS:  A comprehensive review of systems was negative  except for: Cardiovascular: positive for near-syncope  Physical Exam: Filed Vitals:   07/27/15 1008 07/27/15 1010  BP: 160/75   Pulse:  68  Temp:    Resp:       General: Alert, sitting on the edge of her bed talking on her phone- no acute distress HEENT: pupils are equally round and reactive to light, extra motions are intact, mucous members are moist Neck: no JVD Heart: regular rate and rhythm Lungs: poor air movement likely secondary to chronic lung disease. No rales are appreciated Abdomen: obese, soft, nontender Extremities: trace edema Skin: warm and dry Neuro: alert and nonfocal  Assessment/Plan: 60 year old black female with diabetes, hypertension, coronary artery disease also with CKD.  She presents with acute on chronic renal failure during a hospitalization for volume overload 1.Renal- acute on chronic renal failure.  Patient has baseline CKD with creatinine of between 1.6 and 1.9. She was started on an ACE inhibitor in December and creatinine in February '17 was 2.2. She now presents with chest pain which was felt to be secondary to volume overload.She was hypertensive and blood pressure was lowered but not drastically.  She is making urine. Technically, her creatinine has risen but only a slight amount. I  suspect the main reason she has acute on chronic renal failure was due to the lisinopril that was started as an outpatient some months ago. The changes more recently could be due to hemodynamic shifts related to appropriate diuresis/the NSAID she took as an outpatient.  Her urine seems bland. What I hope is that more time with a fairly stable blood pressure will lead kidney function to plateau and start to improve 2. Hypertension/volume  - it certainly doesn't seem blood pressure has gotten too low. If anything it is high. She's been resumed on all of her outpatient blood pressure medications to include Norvasc, clonidine, metoprolol. Her ACE and diuretic are on hold at this time.  I actually wouldn't change her medicines at this point.  I will check orthostatics to make sure that is not an issue since she is complaining of dizziness. 3. Anemia  - is present and likely due to her CKD- nothing is needed right now 4. Dispo- i don't think anything drastic needs to be done at this point. I would continue medications as is.I would like to see kidney function at least stable before she is discharged but understand she has some urgency to be discharged due to  her family situation.  I will wait and once I see her labs tomorrow  Thank you for this consultation. I will continue to follow with you  Verlon Pischke A 07/27/2015, 10:44 AM

## 2015-07-27 NOTE — Progress Notes (Signed)
SUBJECTIVE:  Feeling better.  Less SOB.  Abdomen still feels swollen but better.    OBJECTIVE:   Vitals:   Filed Vitals:   07/26/15 1317 07/26/15 2109 07/26/15 2245 07/27/15 0448  BP: 128/64 142/64  165/67  Pulse: 64 61  72  Temp: 98 F (36.7 C)  98.4 F (36.9 C) 98.2 F (36.8 C)  TempSrc: Oral  Oral Oral  Resp: 18 18  20   Height:      Weight:    251 lb 14.4 oz (114.261 kg)  SpO2: 96% 94%  98%   I&O's:   Intake/Output Summary (Last 24 hours) at 07/27/15 0817 Last data filed at 07/27/15 0535  Gross per 24 hour  Intake    600 ml  Output   1100 ml  Net   -500 ml   TELEMETRY: Reviewed telemetry pt in NSR:     PHYSICAL EXAM General: Well developed, well nourished, in no acute distress Head: Eyes PERRLA, No xanthomas.   Normal cephalic and atramatic  Lungs:   Clear bilaterally to auscultation and percussion. Heart:   HRRR S1 S2 Pulses are 2+ & equal. Abdomen: Bowel sounds are positive, abdomen soft and non-tender without masses  Extremities:   1+ edema of RLE Neuro: Alert and oriented X 3. Psych:  Good affect, responds appropriately   LABS: Basic Metabolic Panel:  Recent Labs  07/26/15 0326 07/27/15 0232  NA 141 140  K 4.2 4.1  CL 106 105  CO2 26 25  GLUCOSE 209* 209*  BUN 27* 36*  CREATININE 2.54* 2.72*  CALCIUM 9.3 9.2   Liver Function Tests: No results for input(s): AST, ALT, ALKPHOS, BILITOT, PROT, ALBUMIN in the last 72 hours. No results for input(s): LIPASE, AMYLASE in the last 72 hours. CBC:  Recent Labs  07/25/15 1125 07/27/15 0232  WBC 5.8 5.1  HGB 10.4* 9.5*  HCT 30.1* 29.5*  MCV 91.8 93.4  PLT 203 172   Cardiac Enzymes:  Recent Labs  07/25/15 2037 07/26/15 0326  TROPONINI <0.03 <0.03   BNP: Invalid input(s): POCBNP D-Dimer: No results for input(s): DDIMER in the last 72 hours. Hemoglobin A1C:  Recent Labs  07/25/15 2037  HGBA1C 7.2*   Fasting Lipid Panel:  Recent Labs  07/25/15 2037  CHOL 142  HDL 39*  LDLCALC  59  TRIG 221*  CHOLHDL 3.6   Thyroid Function Tests:  Recent Labs  07/25/15 2037  TSH 3.124   Anemia Panel: No results for input(s): VITAMINB12, FOLATE, FERRITIN, TIBC, IRON, RETICCTPCT in the last 72 hours. Coag Panel:   Lab Results  Component Value Date   INR 1.02 12/06/2014   INR 0.89 01/25/2014   INR 0.98 09/27/2013    RADIOLOGY: Dg Chest 2 View  07/25/2015  CLINICAL DATA:  SOB since last night, face feels swollen, pain in the arms, neck and face, slight CP at times, dry cough for a little while now - hx of exsmoker, PNA in past, CHF, diabetes, htn, asthma, heart surgery, 3 stents, MI EXAM: CHEST  2 VIEW COMPARISON:  Report of 01/25/2014, images not available. FINDINGS: Prior median sternotomy. Lower thoracic spondylosis. Midline trachea. Mild cardiomegaly, accentuated by low lung volumes. No pleural effusion or pneumothorax. Pulmonary interstitial prominence could be due to low lung volumes. Left mid lung scarring with left greater than right base volume loss. IMPRESSION: Cardiomegaly and low lung volumes. Pulmonary interstitial prominence could be secondary low lung volumes or mild pulmonary venous congestion. No overt congestive failure. Left mid lung  scarring with bibasilar volume loss. Electronically Signed   By: Abigail Miyamoto M.D.   On: 07/25/2015 12:18    ASSESSMENT AND PLAN:  Principal Problem:  Chest pain at rest Active Problems:  HTN (hypertension), malignant, patent renal arteries  CAD -S/P PCI June 2015 and 12/14/14  Hypothyroidism  Diabetic neuropathy, type II diabetes mellitus ([place])  Acute kidney injury superimposed on chronic kidney disease ([place])  Creatinine elevation  Chronic combined systolic and diastolic heart failure ([place])  1. Chest pain: Currently pain free. No EKG changes, troponin negative x2. She denies any pain since being admitted. Her pain is really more abdominal fullness than chest discomfort and suspect this is all due to CHF.  No  ischemic evaluation at this time. Imdur increased to 120mg  daily.     2. Chronic combined systolic and diastolic CHF: Last echo shows EF of 35-40% in August 2016.  2D echo pending. She has been taking 40mg  Lasix at home and says she doesn't feel like its "working all that well" anymore. Her belly is tight and she feels SOB with activity, like walking to the bathroom. Today, her creatinine is up 2.54->2.72.  She appears to have a baseline creatinine of 1.6-1.7. No ACEI or ARB. BNP is 339.5. UOP -1100 yesterday and she is net neg 500cc.   Her dry weight appears to be about 235 pounds, she is 251 today but was 249 yesterday.  ? Accuracy of weights since her weight went up despite diuresing 1L.  She still appears volume overloaded on exam.  Will await renal recs.    3. HTN: She is hypertensive with SBP in the 140's but as high as 170's. She is allergic to hydralazine. Continue amlodipine/doxazosin/BB.  Will increase clonidine to 0.3mg  BID.    4.  ASCAD s/p CABG with no angina.  Decrease ASA to 81mg  daily.  Continue Plavix/Imdur/BB and statin.    5.  Dyslipidemia - continue statin.  LDL at goal.   6.  CKD stage III - creatinine trending upward with diuresis but still appears volume overloaded.  BP not controlled so ? Whether this is due to worsening hypertensive kidney disease.  HbA1C at 7.2%.  Await renal recs.   [name], MD  07/27/2015  8:17 AM

## 2015-07-27 NOTE — Progress Notes (Signed)
[facility]                                                                              Patient Demographics  Brooke Stafford, is a 60 y.o. female, DOB - Jun 13, 1955, [id number]  Admit date - 07/25/2015   Admitting Physician Brooke Dickens, MD  Outpatient Primary MD for the patient is Brooke Chessman, MD  LOS -   days    Chief Complaint  Patient presents with  . Chest Pain  . Shortness of Breath  . Hand Pain       Brief HPI   Brooke Stafford is a 60 y.o. female with a Past Medical History of STEMI, CABG, HTN, GERD, CKD, Obesity, CHF, gout who presents with atypical CP. Patient reported that she was watching TV on the morning of admission, developed midsternal chest pain, radiating to both shoulders and down both arms, right facial pain with shortness of breath and diaphoresis. Patient was admitted for further workup.    Assessment & Plan    Atypical Chest pain: Currently resolved-History of CAD status post CABG 6 in 2002, and STEMI in 2011, last stent was DES to ramus in 11/2014 -troponin negative. EKG With TWI in lateral leads - Cardiology consulted, felt her main problem is acute on chronic systolic CHF and no acute coronary event currently. - Cardiology recommended to continue aspirin, Plavix, beta blocker, statin, adjust BP meds - Repeat 2-D echocardiogram - Recommended nephrology consult for worsening creatinine function. - Aspirin decreased to 81 mg daily   AKI on CKD 3: Likely worsening due to NSAID and ACE inhibitor outpatient, diuresis -Creatinine 2.37 on admission, baseline closer 1.6, Now worsened to 2.7 -hold ACE, lasix, Likely related to decreased oral intake in the setting of ACE inhibitor. - Nephrology consulted, and discussed with Dr. Clover Stafford -Per patient she has an appointment with nephrology, Brooke Stafford on 4/26   Hypertension.  -stable, continue amlodipine, clonidine, Cardura, Toprol -hold Lasix lisinopril  - Per  nephrology, continue current antihypertensives, check orthostatics - Clonidine increased   Chronic combined systolic and diastolic heart failure. Echo in July 2016 showed an ejection fraction of 35-40% with grade 2 diastolic dysfunction.  - Holding Lasix and ACE inhibitor for now secondary to #2, stop IVF -Continue toprol   Diabetes. Type II. Uncontrolled. - A1c 7.2 on 4/11  -continue lantus at lower dose, SSI - Increase Lantus to 30 units, (patient takes 40 units at bedtime at home, NovoLog 10-18 units with meals   Hypothyroidism. TSH in August 2016 2.0. --continue home Synthroid  Grief process: - Patient reported that her 83year old son had died on July 13, 2022 in [place], she found out yesterday. She is hoping to be released soon so that she can participate in funeral process  Code Status: full code  Family Communication: Discussed in detail with the patient, all imaging results, lab results explained to the patient   Disposition Plan: hopefully dc 24-48hrs, if creatinine function is trending down  Time Spent in minutes   25 minutes  Procedures  none  Consults   Renal cardiology  DVT Prophylaxis  Lovenox   Medications  Scheduled  Meds: . allopurinol  300 mg Oral Daily  . amLODipine  10 mg Oral Daily  . aspirin EC  81 mg Oral Daily  . atorvastatin  40 mg Oral Daily  . cloNIDine  0.3 mg Oral BID  . clopidogrel  75 mg Oral Daily  . docusate sodium  100 mg Oral BID  . doxazosin  2 mg Oral QHS  . enoxaparin (LOVENOX) injection  40 mg Subcutaneous Q24H  . ezetimibe  10 mg Oral Daily  . gabapentin  400 mg Oral TID  . insulin aspart  0-15 Units Subcutaneous TID WC  . insulin aspart  0-5 Units Subcutaneous QHS  . insulin glargine  20 Units Subcutaneous QHS  . isosorbide mononitrate  120 mg Oral Daily  . levothyroxine  25 mcg Oral QAC breakfast  . magnesium citrate  1 Bottle Oral Once  . metoprolol succinate  150 mg Oral Daily  . traZODone  25 mg Oral QHS    Continuous Infusions:  PRN Meds:.acetaminophen, albuterol, ALPRAZolam, cloNIDine, fluticasone, gi cocktail, morphine injection, ondansetron (ZOFRAN) IV   Antibiotics   Anti-infectives    None        Subjective:   Brooke Stafford was seen and examined today. Patient denies dizziness, chest pain, shortness of breath, abdominal pain, N/V/D/C, new weakness, numbess, tingling. No acute events overnight.  Sad because of her son's death.  Objective:   Filed Vitals:   07/26/15 2245 07/27/15 0448 07/27/15 1008 07/27/15 1010  BP:  165/67 160/75   Pulse:  72  68  Temp: 98.4 F (36.9 C) 98.2 F (36.8 C)    TempSrc: Oral Oral    Resp:  20    Height:      Weight:  114.261 kg (251 lb 14.4 oz)    SpO2:  98%      Intake/Output Summary (Last 24 hours) at 07/27/15 1146 Last data filed at 07/27/15 0535  Gross per 24 hour  Intake    600 ml  Output   1100 ml  Net   -500 ml     Wt Readings from Last 3 Encounters:  07/27/15 114.261 kg (251 lb 14.4 oz)  03/28/15 110.224 kg (243 lb)  02/23/15 107.775 kg (237 lb 9.6 oz)     Exam  General: Alert and oriented x 3, NAD  HEENT:  PERRLA, EOMI, Anicteric Sclera, mucous membranes moist.   Neck: Supple, no JVD, no masses  CVS: S1 S2 auscultated, no rubs, murmurs or gallops. Regular rate and rhythm.  Respiratory: dec BS at bases  Abdomen: Soft, nontender, nondistended, + bowel sounds  Ext: no cyanosis clubbing, trace edema  Neuro: AAOx3, Cr N's II- XII. Strength 5/5 upper and lower extremities bilaterally  Skin: No rashes  Psych: Normal affect and demeanor, alert and oriented x3    Data Reviewed:  I have personally reviewed following labs and imaging studies  Micro Results No results found for this or any previous visit (from the past 240 hour(s)).  Radiology Reports Dg Chest 2 View  07/25/2015  CLINICAL DATA:  SOB since last night, face feels swollen, pain in the arms, neck and face, slight CP at times, dry cough for a  little while now - hx of exsmoker, PNA in past, CHF, diabetes, htn, asthma, heart surgery, 3 stents, MI EXAM: CHEST  2 VIEW COMPARISON:  Report of 01/25/2014, images not available. FINDINGS: Prior median sternotomy. Lower thoracic spondylosis. Midline trachea. Mild cardiomegaly, accentuated by low lung volumes. No pleural effusion or pneumothorax. Pulmonary  interstitial prominence could be due to low lung volumes. Left mid lung scarring with left greater than right base volume loss. IMPRESSION: Cardiomegaly and low lung volumes. Pulmonary interstitial prominence could be secondary low lung volumes or mild pulmonary venous congestion. No overt congestive failure. Left mid lung scarring with bibasilar volume loss. Electronically Signed   By: Abigail Miyamoto M.D.   On: 07/25/2015 12:18    CBC  Recent Labs Lab 07/25/15 1125 07/27/15 0232  WBC 5.8 5.1  HGB 10.4* 9.5*  HCT 30.1* 29.5*  PLT 203 172  MCV 91.8 93.4  MCH 31.7 30.1  MCHC 34.6 32.2  RDW 14.1 13.9    Chemistries   Recent Labs Lab 07/25/15 1125 07/26/15 0326 07/27/15 0232  NA 144 141 140  K 3.9 4.2 4.1  CL 107 106 105  CO2 27 26 25   GLUCOSE 161* 209* 209*  BUN 25* 27* 36*  CREATININE 2.37* 2.54* 2.72*  CALCIUM 9.6 9.3 9.2   ------------------------------------------------------------------------------------------------------------------ estimated creatinine clearance is 30.1 mL/min (by C-G formula based on Cr of 2.72). ------------------------------------------------------------------------------------------------------------------  Recent Labs  07/25/15 2037  HGBA1C 7.2*   ------------------------------------------------------------------------------------------------------------------  Recent Labs  07/25/15 2037  CHOL 142  HDL 39*  LDLCALC 59  TRIG 221*  CHOLHDL 3.6   ------------------------------------------------------------------------------------------------------------------  Recent Labs   07/25/15 2037  TSH 3.124   ------------------------------------------------------------------------------------------------------------------ No results for input(s): VITAMINB12, FOLATE, FERRITIN, TIBC, IRON, RETICCTPCT in the last 72 hours.  Coagulation profile No results for input(s): INR, PROTIME in the last 168 hours.  No results for input(s): DDIMER in the last 72 hours.  Cardiac Enzymes  Recent Labs Lab 07/25/15 2037 07/26/15 0326  TROPONINI <0.03 <0.03   ------------------------------------------------------------------------------------------------------------------ Invalid input(s): POCBNP   Recent Labs  07/26/15 0623 07/26/15 1109 07/26/15 1625 07/26/15 2107 07/27/15 0609 07/27/15 1128  GLUCAP 180* 132* 215* 144* 249* 21*     RAI,RIPUDEEP M.D. [facility] 07/27/2015, 11:46 AM  Pager: [phone] Between 7am to 7pm - call Pager - [phone]  After 7pm go to [email or web address] - password [email or web address]  Call night coverage person covering after 7pm

## 2015-07-28 DIAGNOSIS — I10 Essential (primary) hypertension: Secondary | ICD-10-CM | POA: Diagnosis not present

## 2015-07-28 DIAGNOSIS — I5041 Acute combined systolic (congestive) and diastolic (congestive) heart failure: Secondary | ICD-10-CM | POA: Diagnosis not present

## 2015-07-28 DIAGNOSIS — E785 Hyperlipidemia, unspecified: Secondary | ICD-10-CM | POA: Diagnosis not present

## 2015-07-28 DIAGNOSIS — I129 Hypertensive chronic kidney disease with stage 1 through stage 4 chronic kidney disease, or unspecified chronic kidney disease: Secondary | ICD-10-CM | POA: Diagnosis not present

## 2015-07-28 DIAGNOSIS — N184 Chronic kidney disease, stage 4 (severe): Secondary | ICD-10-CM | POA: Diagnosis not present

## 2015-07-28 DIAGNOSIS — R079 Chest pain, unspecified: Secondary | ICD-10-CM | POA: Diagnosis not present

## 2015-07-28 DIAGNOSIS — I2510 Atherosclerotic heart disease of native coronary artery without angina pectoris: Secondary | ICD-10-CM | POA: Diagnosis not present

## 2015-07-28 DIAGNOSIS — E119 Type 2 diabetes mellitus without complications: Secondary | ICD-10-CM | POA: Diagnosis not present

## 2015-07-28 DIAGNOSIS — R0789 Other chest pain: Secondary | ICD-10-CM | POA: Diagnosis not present

## 2015-07-28 DIAGNOSIS — N2581 Secondary hyperparathyroidism of renal origin: Secondary | ICD-10-CM | POA: Diagnosis not present

## 2015-07-28 DIAGNOSIS — I5042 Chronic combined systolic (congestive) and diastolic (congestive) heart failure: Secondary | ICD-10-CM

## 2015-07-28 DIAGNOSIS — N179 Acute kidney failure, unspecified: Secondary | ICD-10-CM | POA: Diagnosis not present

## 2015-07-28 LAB — BASIC METABOLIC PANEL
ANION GAP: 8 (ref 5–15)
BUN: 42 mg/dL — AB (ref 6–20)
CALCIUM: 9.3 mg/dL (ref 8.9–10.3)
CO2: 26 mmol/L (ref 22–32)
CREATININE: 2.74 mg/dL — AB (ref 0.44–1.00)
Chloride: 105 mmol/L (ref 101–111)
GFR calc Af Amer: 21 mL/min — ABNORMAL LOW (ref >60)
GFR, EST NON AFRICAN AMERICAN: 18 mL/min — AB (ref >60)
GLUCOSE: 228 mg/dL — AB (ref 65–99)
Potassium: 4.4 mmol/L (ref 3.5–5.1)
Sodium: 139 mmol/L (ref 135–145)

## 2015-07-28 LAB — GLUCOSE, CAPILLARY
Glucose-Capillary: 200 mg/dL — ABNORMAL HIGH (ref 65–99)
Glucose-Capillary: 209 mg/dL — ABNORMAL HIGH (ref 65–99)

## 2015-07-28 MED ORDER — ALPRAZOLAM 0.25 MG PO TABS: 0.2500 mg | ORAL_TABLET | Freq: Two times a day (BID) | ORAL | Status: DC | PRN

## 2015-07-28 MED ORDER — ALLOPURINOL 300 MG PO TABS: 300.0000 mg | ORAL_TABLET | Freq: Every day | ORAL | Status: DC

## 2015-07-28 MED ORDER — AMLODIPINE BESYLATE 10 MG PO TABS: 10.0000 mg | ORAL_TABLET | Freq: Every day | ORAL | Status: DC

## 2015-07-28 MED ORDER — ATORVASTATIN CALCIUM 40 MG PO TABS: 40.0000 mg | ORAL_TABLET | Freq: Every day | ORAL | Status: DC

## 2015-07-28 MED ORDER — DOCUSATE SODIUM 100 MG PO CAPS: 100.0000 mg | ORAL_CAPSULE | Freq: Two times a day (BID) | ORAL | Status: DC

## 2015-07-28 MED ORDER — MAGNESIUM CITRATE PO SOLN: 1.0000 | ORAL | Status: DC | PRN

## 2015-07-28 MED ORDER — CLOPIDOGREL BISULFATE 75 MG PO TABS: 75.0000 mg | ORAL_TABLET | Freq: Every day | ORAL | Status: DC

## 2015-07-28 MED ORDER — FUROSEMIDE 40 MG PO TABS: 40.0000 mg | ORAL_TABLET | Freq: Every day | ORAL | Status: DC

## 2015-07-28 MED ORDER — RANITIDINE HCL 75 MG PO TABS: 75.0000 mg | ORAL_TABLET | Freq: Two times a day (BID) | ORAL | Status: DC

## 2015-07-28 MED ORDER — EZETIMIBE 10 MG PO TABS: 10.0000 mg | ORAL_TABLET | Freq: Every day | ORAL | Status: DC

## 2015-07-28 MED ORDER — CLONIDINE HCL 0.3 MG PO TABS: 0.3000 mg | ORAL_TABLET | Freq: Two times a day (BID) | ORAL | Status: DC

## 2015-07-28 MED ORDER — ACETAMINOPHEN-CODEINE #3 300-30 MG PO TABS: 1.0000 | ORAL_TABLET | ORAL | Status: DC | PRN

## 2015-07-28 MED ORDER — LEVOTHYROXINE SODIUM 25 MCG PO TABS: ORAL_TABLET | ORAL | Status: DC

## 2015-07-28 MED ORDER — ISOSORBIDE MONONITRATE ER 120 MG PO TB24: 120.0000 mg | ORAL_TABLET | Freq: Every day | ORAL | Status: DC

## 2015-07-28 MED ORDER — METOPROLOL SUCCINATE ER 50 MG PO TB24: 150.0000 mg | ORAL_TABLET | Freq: Every day | ORAL | Status: DC

## 2015-07-28 NOTE — Progress Notes (Addendum)
SUBJECTIVE:  No complaints   OBJECTIVE:   Vitals:   Filed Vitals:   07/27/15 1446 07/27/15 1448 07/27/15 2031 07/28/15 0451  BP: 122/64 135/57 140/62 172/73  Pulse: 63 64 63 67  Temp:   98.1 F (36.7 C) 98.5 F (36.9 C)  TempSrc:   Oral Oral  Resp: 20 20 18 18   Height:      Weight:    253 lb 1.6 oz (114.805 kg)  SpO2: 97% 97% 98% 97%   I&O's:   Intake/Output Summary (Last 24 hours) at 07/28/15 0817 Last data filed at 07/28/15 0529  Gross per 24 hour  Intake   1200 ml  Output    900 ml  Net    300 ml   TELEMETRY: Reviewed telemetry pt in NSR:     PHYSICAL EXAM General: Well developed, well nourished, in no acute distress Head: Eyes PERRLA, No xanthomas.   Normal cephalic and atramatic  Lungs:   Clear bilaterally to auscultation and percussion. Heart:   HRRR S1 S2 Pulses are 2+ & equal. Abdomen: Bowel sounds are positive, abdomen soft and non-tender without masses Extremities:   Trace edema RLE Neuro: Alert and oriented X 3. Psych:  Good affect, responds appropriately   LABS: Basic Metabolic Panel:  Recent Labs  07/27/15 0232 07/28/15 0302  NA 140 139  K 4.1 4.4  CL 105 105  CO2 25 26  GLUCOSE 209* 228*  BUN 36* 42*  CREATININE 2.72* 2.74*  CALCIUM 9.2 9.3   Liver Function Tests: No results for input(s): AST, ALT, ALKPHOS, BILITOT, PROT, ALBUMIN in the last 72 hours. No results for input(s): LIPASE, AMYLASE in the last 72 hours. CBC:  Recent Labs  07/25/15 1125 07/27/15 0232  WBC 5.8 5.1  HGB 10.4* 9.5*  HCT 30.1* 29.5*  MCV 91.8 93.4  PLT 203 172   Cardiac Enzymes:  Recent Labs  07/25/15 2037 07/26/15 0326  TROPONINI <0.03 <0.03   BNP: Invalid input(s): POCBNP D-Dimer: No results for input(s): DDIMER in the last 72 hours. Hemoglobin A1C:  Recent Labs  07/25/15 2037  HGBA1C 7.2*   Fasting Lipid Panel:  Recent Labs  07/25/15 2037  CHOL 142  HDL 39*  LDLCALC 59  TRIG 221*  CHOLHDL 3.6   Thyroid Function  Tests:  Recent Labs  07/25/15 2037  TSH 3.124   Anemia Panel: No results for input(s): VITAMINB12, FOLATE, FERRITIN, TIBC, IRON, RETICCTPCT in the last 72 hours. Coag Panel:   Lab Results  Component Value Date   INR 1.02 12/06/2014   INR 0.89 01/25/2014   INR 0.98 09/27/2013    RADIOLOGY: Dg Chest 2 View  07/25/2015  CLINICAL DATA:  SOB since last night, face feels swollen, pain in the arms, neck and face, slight CP at times, dry cough for a little while now - hx of exsmoker, PNA in past, CHF, diabetes, htn, asthma, heart surgery, 3 stents, MI EXAM: CHEST  2 VIEW COMPARISON:  Report of 01/25/2014, images not available. FINDINGS: Prior median sternotomy. Lower thoracic spondylosis. Midline trachea. Mild cardiomegaly, accentuated by low lung volumes. No pleural effusion or pneumothorax. Pulmonary interstitial prominence could be due to low lung volumes. Left mid lung scarring with left greater than right base volume loss. IMPRESSION: Cardiomegaly and low lung volumes. Pulmonary interstitial prominence could be secondary low lung volumes or mild pulmonary venous congestion. No overt congestive failure. Left mid lung scarring with bibasilar volume loss. Electronically Signed   By: Adria Devon.D.  On: 07/25/2015 12:18    ASSESSMENT AND PLAN:  Principal Problem:  Chest pain at rest Active Problems:  HTN (hypertension), malignant, patent renal arteries  CAD -S/P PCI June 2015 and 12/14/14  Hypothyroidism  Diabetic neuropathy, type II diabetes mellitus ([place])  Acute kidney injury superimposed on chronic kidney disease ([place])  Creatinine elevation  Chronic combined systolic and diastolic heart failure ([place])  1. Chest pain: Currently pain free. No EKG changes, troponin negative x2. She denies any pain since being admitted. Her pain is really more abdominal fullness than chest discomfort and suspect this is all due to CHF. No ischemic evaluation at this time. Imdur increased to  120mg  daily.   2. Acute on chronic combined systolic and diastolic CHF: Last echo shows EF of 35-40% in August 2016. 2D echo with low normal LVF EF 50-55% with grade 2 diastolic dysfunction. She has been taking 40mg  Lasix at home and says she doesn't feel like its "working all that well" anymore. Her belly is tight and she feels SOB with activity, like walking to the bathroom. Today, her creatinine has leveled off 2.54->2.72->2.74.  She appears to have a baseline creatinine of 1.6-1.7. No ACEI or ARB. BNP is 339.5. UOP -900 yesterday and she is net neg 200cc. Her dry weight appears to be about 235 pounds, she is 253 today but was 249 on admit. ? Accuracy of weights since her weight went up despite diuresing. She feels good and wants to go home as her son was killed and she needs to go attend to things.    3. HTN: SBP improved.  She is allergic to hydralazine. Continue amlodipine/doxazosin/BB/ clonidine .   4. ASCAD s/p CABG with no angina. Continue ASA/Plavix/Imdur/BB and statin.   5. Dyslipidemia - continue statin. LDL at goal.   6. CKD stage III - creatinine appears to be leveling off.Would check with renal on when ok to restart Lasix  I thinks she is stable from cardiac standpoint for discharge.  Will need early followup with Dr. Recardo Evangelist in 7-10 days.  Would check with renal on timing of restarting Lasix. [name], MD  07/28/2015  8:17 AM

## 2015-07-28 NOTE — Progress Notes (Signed)
Discussed with the patient and all questioned fully answered. She will call me if any problems arise.  Given paper prescriptions for all medications. Pt said she was out of home medications except insulin and albuterol inhaler - MD Rai made aware and printed prescriptions. Per call to [facility] no refills available on meds except albuterol inhaler - pt made aware and will make appt with PCP.  Highlighted pt discharge instructions to clarify which medications she had/had not received today. Pt expressed thanks and feels confident with plan of care on discharge.   Fritz Pickerel, RN

## 2015-07-28 NOTE — Discharge Summary (Signed)
Physician Discharge Summary   Patient ID: Brooke Stafford MRN: [id number] DOB/AGE: 1956-03-25 60 y.o.  Admit date: 07/25/2015 Discharge date: 07/28/2015  Primary Care Physician:  Angelica Chessman, MD  Discharge Diagnoses:    . HTN (hypertension), malignant, patent renal arteries . Diabetic neuropathy, type II diabetes mellitus ([place]) . Hypothyroidism . Acute kidney injury superimposed on chronic kidney disease ([place]) . Creatinine elevation . Chest pain at rest . Chronic combined systolic and diastolic heart failure ([place]) . Dyslipidemia . Acute combined systolic and diastolic heart failure [facility])  Consults:  Nephrology Cardiology  Recommendations for Outpatient Follow-up:  1. Please repeat CBC/BMET at next visit 2. Cardura, ACEI discontinued   DIET: Carb modified diet    Allergies:   Allergies  Allergen Reactions  . Hydralazine Shortness Of Breath  . Penicillins Cross Reactors Hives    And high fever  . Adhesive [Tape] Rash    bruising     DISCHARGE MEDICATIONS: Current Discharge Medication List    START taking these medications   Details  ALPRAZolam (XANAX) 0.25 MG tablet Take 1 tablet (0.25 mg total) by mouth 2 (two) times daily as needed for anxiety. Qty: 30 tablet, Refills: 0    docusate sodium (COLACE) 100 MG capsule Take 1 capsule (100 mg total) by mouth 2 (two) times daily. constipation Qty: 60 capsule, Refills: 0    magnesium citrate SOLN Take 296 mLs (1 Bottle total) by mouth as needed for severe constipation. Qty: 195 mL, Refills: 5    ranitidine (ZANTAC 75) 75 MG tablet Take 1 tablet (75 mg total) by mouth 2 (two) times daily. Qty: 60 tablet, Refills: 3      CONTINUE these medications which have CHANGED   Details  acetaminophen-codeine (TYLENOL #3) 300-30 MG tablet Take 1 tablet by mouth every 4 (four) hours as needed for moderate pain. Qty: 30 tablet, Refills: 0   Associated Diagnoses: Type 2 diabetes mellitus with diabetic neuropathy,  with long-term current use of insulin (HCC)    cloNIDine (CATAPRES) 0.3 MG tablet Take 1 tablet (0.3 mg total) by mouth 2 (two) times daily. Qty: 60 tablet, Refills: 3    isosorbide mononitrate (IMDUR) 120 MG 24 hr tablet Take 1 tablet (120 mg total) by mouth daily. Qty: 30 tablet, Refills: 4    levothyroxine (SYNTHROID, LEVOTHROID) 25 MCG tablet take 1 tablet by mouth every morning BEFORE BREAKFAST Qty: 30 tablet, Refills: 3    metoprolol succinate (TOPROL-XL) 50 MG 24 hr tablet Take 3 tablets (150 mg total) by mouth daily. Take with or immediately following a meal. Qty: 90 tablet, Refills: 5      CONTINUE these medications which have NOT CHANGED   Details  albuterol (PROVENTIL HFA;VENTOLIN HFA) 108 (90 BASE) MCG/ACT inhaler Inhale 2 puffs into the lungs every 6 (six) hours as needed for wheezing or shortness of breath. Qty: 3 Inhaler, Refills: 3   Associated Diagnoses: Seasonal allergies    allopurinol (ZYLOPRIM) 300 MG tablet Take 1 tablet (300 mg total) by mouth daily. Qty: 90 tablet, Refills: 3   Associated Diagnoses: Gout of big toe    amLODipine (NORVASC) 10 MG tablet Take 1 tablet (10 mg total) by mouth daily. Qty: 30 tablet, Refills: 11    aspirin 81 MG chewable tablet Chew 1 tablet (81 mg total) by mouth daily. Qty: 30 tablet, Refills: 10    atorvastatin (LIPITOR) 40 MG tablet Take 1 tablet (40 mg total) by mouth daily. Qty: 90 tablet, Refills: 3    clopidogrel (PLAVIX)  75 MG tablet Take 1 tablet (75 mg total) by mouth daily. Qty: 90 tablet, Refills: 2    cyclobenzaprine (FLEXERIL) 10 MG tablet Take 1 tablet (10 mg total) by mouth 3 (three) times daily as needed for muscle spasms. Qty: 60 tablet, Refills: 3   Associated Diagnoses: Diabetic neuropathy, type II diabetes mellitus (HCC)    ezetimibe (ZETIA) 10 MG tablet Take 1 tablet (10 mg total) by mouth daily. Qty: 90 tablet, Refills: 1    fluticasone (FLONASE) 50 MCG/ACT nasal spray Place 2 sprays into both  nostrils daily as needed for allergies or rhinitis. Qty: 16 g, Refills: 3   Associated Diagnoses: Seasonal allergies    furosemide (LASIX) 40 MG tablet Take 1 tablet (40 mg total) by mouth daily. Qty: 30 tablet, Refills: 11    gabapentin (NEURONTIN) 400 MG capsule take 1 capsule by mouth three times a day Qty: 270 capsule, Refills: 3    insulin aspart (NOVOLOG FLEXPEN) 100 UNIT/ML FlexPen Inject 10-18 units into the skin 3 times daily, plus sliding scale as instructed. Qty: 30 mL, Refills: 5    Insulin Glargine (TOUJEO SOLOSTAR) 300 UNIT/ML SOPN Inject 40 Units into the skin at bedtime. Qty: 6 pen, Refills: 5    Insulin Pen Needle 31G X 5 MM MISC Use to inject insulin 4 times daily as instructed. Qty: 130 each, Refills: 5    Insulin Syringes, Disposable, (B-D INSULIN SYRINGE 1CC) U-100 1 ML MISC Use to inject insulin 4 times daily as instructed. Qty: 160 each, Refills: 4    loratadine (CLARITIN) 10 MG tablet Take 1 tablet (10 mg total) by mouth daily as needed for allergies. Qty: 30 tablet, Refills: 11   Associated Diagnoses: Seasonal allergies    Menthol-Camphor (TIGER BALM ARTHRITIS RUB) 11-11 % CREA Apply 1 application topically 2 (two) times daily.    traZODone (DESYREL) 50 MG tablet Take 0.5 tablets (25 mg total) by mouth at bedtime. Qty: 45 tablet, Refills: 3   Associated Diagnoses: Depression    triamcinolone cream (KENALOG) 0.1 % Apply 1 application topically 2 (two) times daily. Qty: 45 g, Refills: 0   Associated Diagnoses: Rash of hands    nitroGLYCERIN (NITROSTAT) 0.4 MG SL tablet Place 1 tablet (0.4 mg total) under the tongue every 5 (five) minutes as needed. For chest Qty: 30 tablet, Refills: 6   Associated Diagnoses: Essential hypertension      STOP taking these medications     doxazosin (CARDURA) 2 MG tablet      lisinopril (PRINIVIL,ZESTRIL) 10 MG tablet          Brief H and P: For complete details please refer to admission H and P, but in brief  Brooke Stafford is a 60 y.o. female with a Past Medical History of STEMI, CABG, HTN, GERD, CKD, Obesity, CHF, gout who presents with atypical CP. Patient reported that she was watching TV on the morning of admission, developed midsternal chest pain, radiating to both shoulders and down both arms, right facial pain with shortness of breath and diaphoresis. Patient was admitted for further workup.  Hospital Course:  Atypical Chest pain: Currently resolved-History of CAD status post CABG 6 in 2002, and STEMI in 2011, last stent was DES to ramus in 11/2014 -troponin negative. EKG With TWI in lateral leads - Cardiology consulted (Dr Radford Pax) and felt her main problem is acute on chronic systolic CHF and no acute coronary event currently. - Cardiology recommended to continue aspirin, Plavix, beta blocker, statin, adjust BP meds -  2-D echocardiogramShowed EF of 50-55% with grade 2 diastolic dysfunction - Nephrology was also consulted, patient seen by Dr. Clover Mealy - Aspirin decreased to 81 mg daily  AKI on CKD 3: Likely worsening due to NSAID and ACE inhibitor outpatient, diuresis -Creatinine 2.37 on admission, baseline closer 1.6, worsened to 2.7, however plateaued now at 2.7 - Held ACE, lasix, Likely related to decreased oral intake in the setting of ACE inhibitor. Nephrology was consulted and patient was seen by Dr. Clover Mealy, recommended to stop Cardura, continue to hold ACE inhibitor. However resume Lasix. Patient has an appointment with nephrology, Dr. Lorrene Reid on 4/26  Hypertension.  -stable, continue amlodipine, clonidine, toprol - Per nephrology, continue Lasix, however hold Cardura and ACE inhibitor - Clonidine increased  Chronic combined systolic and diastolic heart failure. Echo in July 2016 showed an ejection fraction of 35-40% with grade 2 diastolic dysfunction.  - 2-D echo 4/13 showed EF of [id number], grade 2 diastolic dysfunction. Lasix was resumed per nephrology  recommendations. -Continue toprol  Diabetes. Type II. Uncontrolled. - A1c 7.2 on 4/11  -Continue Lantus and sliding scale insulin per patient's home regimen, follow outpatient with PCP  Hypothyroidism. TSH in August 2016 2.0. --continue home Synthroid  Grief process: - Patient reported that her 40year old son had died on 02-Aug-2022 in [place]. She was released per request so that she can participate in funeral process, she will keep her appointment with Dr. Lorrene Reid. She will need BMET to be checked.   Day of Discharge BP 172/73 mmHg  Pulse 67  Temp(Src) 98.5 F (36.9 C) (Oral)  Resp 18  Ht 5\' 10"  (1.778 m)  Wt 114.805 kg (253 lb 1.6 oz)  BMI 36.32 kg/m2  SpO2 97%  LMP 10/13/2014 (Exact Date)  Physical Exam: General: Alert and awake oriented x3 not in any acute distress. HEENT: anicteric sclera, pupils reactive to light and accommodation CVS: S1-S2 clear no murmur rubs or gallops Chest: clear to auscultation bilaterally, no wheezing rales or rhonchi Abdomen: soft nontender, nondistended, normal bowel sounds Extremities: no cyanosis, clubbing or edema noted bilaterally Neuro: Cranial nerves II-XII intact, no focal neurological deficits   The results of significant diagnostics from this hospitalization (including imaging, microbiology, ancillary and laboratory) are listed below for reference.    LAB RESULTS: Basic Metabolic Panel:  Recent Labs Lab 07/27/15 0232 07/28/15 0302  NA 140 139  K 4.1 4.4  CL 105 105  CO2 25 26  GLUCOSE 209* 228*  BUN 36* 42*  CREATININE 2.72* 2.74*  CALCIUM 9.2 9.3   Liver Function Tests: No results for input(s): AST, ALT, ALKPHOS, BILITOT, PROT, ALBUMIN in the last 168 hours. No results for input(s): LIPASE, AMYLASE in the last 168 hours. No results for input(s): AMMONIA in the last 168 hours. CBC:  Recent Labs Lab 07/25/15 1125 07/27/15 0232  WBC 5.8 5.1  HGB 10.4* 9.5*  HCT 30.1* 29.5*  MCV 91.8 93.4  PLT 203 172    Cardiac Enzymes:  Recent Labs Lab 07/25/15 2037 07/26/15 0326  TROPONINI <0.03 <0.03   BNP: Invalid input(s): POCBNP CBG:  Recent Labs Lab 07/27/15 2113 07/28/15 0553  GLUCAP 244* 200*    Significant Diagnostic Studies:  Dg Chest 2 View  07/25/2015  CLINICAL DATA:  SOB since last night, face feels swollen, pain in the arms, neck and face, slight CP at times, dry cough for a little while now - hx of exsmoker, PNA in past, CHF, diabetes, htn, asthma, heart surgery, 3 stents, MI EXAM: CHEST  2 VIEW COMPARISON:  Report of 01/25/2014, images not available. FINDINGS: Prior median sternotomy. Lower thoracic spondylosis. Midline trachea. Mild cardiomegaly, accentuated by low lung volumes. No pleural effusion or pneumothorax. Pulmonary interstitial prominence could be due to low lung volumes. Left mid lung scarring with left greater than right base volume loss. IMPRESSION: Cardiomegaly and low lung volumes. Pulmonary interstitial prominence could be secondary low lung volumes or mild pulmonary venous congestion. No overt congestive failure. Left mid lung scarring with bibasilar volume loss. Electronically Signed   By: Abigail Miyamoto M.D.   On: 07/25/2015 12:18    2D ECHO: Study Conclusions  - Left ventricle: The cavity size was normal. Wall thickness was  normal. Systolic function was normal. The estimated ejection  fraction was in the range of 50% to 55%. Wall motion was normal;  there were no regional wall motion abnormalities. Features are  consistent with a pseudonormal left ventricular filling pattern,  with concomitant abnormal relaxation and increased filling  pressure (grade 2 diastolic dysfunction). - Left atrium: The atrium was mildly dilated  Disposition and Follow-up: Discharge Instructions    Diet Carb Modified    Complete by:  As directed      Increase activity slowly    Complete by:  As directed             DISPOSITION: home    DISCHARGE  FOLLOW-UP Follow-up Information    Follow up with Angelica Chessman, MD. Schedule an appointment as soon as possible for a visit in 10 days.   Specialty:  Internal Medicine   Why:  for hospital follow-up   Contact information:   [place] [address] [phone]       Follow up with DUNHAM,CYNTHIA B, MD On 08/09/2015.   Specialty:  Nephrology   Why:  please keep your appointment    Contact information:   [place] [address] [phone]        Time spent on Discharge: 35 mins  Signed:   Dalesha Stanback M.D. [facility] 07/28/2015, 10:37 AM Pager: [phone]

## 2015-07-28 NOTE — Progress Notes (Signed)
Subjective:  Making good urine- creatinine stable- orthostatics show some changes with standing but not hypotensive   Objective Vital signs in last 24 hours: Filed Vitals:   07/27/15 1446 07/27/15 1448 07/27/15 2031 07/28/15 0451  BP: 122/64 135/57 140/62 172/73  Pulse: 63 64 63 67  Temp:   98.1 F (36.7 C) 98.5 F (36.9 C)  TempSrc:   Oral Oral  Resp: 20 20 18 18   Height:      Weight:    114.805 kg (253 lb 1.6 oz)  SpO2: 97% 97% 98% 97%   Weight change: 0.544 kg (1 lb 3.2 oz)  Intake/Output Summary (Last 24 hours) at 07/28/15 0946 Last data filed at 07/28/15 0529  Gross per 24 hour  Intake    960 ml  Output    900 ml  Net     60 ml   Assessment/Plan: 60 year old black female with diabetes, hypertension, coronary artery disease also with CKD. She presents with acute on chronic renal failure during a hospitalization for volume overload 1.Renal- acute on chronic renal failure. Patient has baseline CKD with creatinine of between 1.6 and 1.9. She was started on an ACE inhibitor in December and creatinine in February '17 was 2.2. She now presents with chest pain which was felt to be secondary to volume overload.She was hypertensive and blood pressure was lowered but not drastically. She is making urine. Technically, her creatinine has risen but only a slight amount. I suspect the main reason she has acute on chronic renal failure was due to the lisinopril that was started as an outpatient some months ago. The changes more recently could be due to hemodynamic shifts related to appropriate diuresis/the NSAID she took as an outpatient. Her urine seems bland. Things seem stable- I do not think needs inpatient management 2. Hypertension/volume -  Norvasc, clonidine, metoprolol, cardura. Her ACE and diuretic are on hold at this time. She is a little orthostatic.- I feel she might be in danger of overcontrol.  Upon discharge I would stop her cardura and ACE- but can restart lasix 3. Anemia -  is present and likely due to her CKD- nothing is needed right now 4. Dispo- I , also feel like she could be discharged.  Would take her off cardura and ACE but resume other OP BP meds.  She has an appt with [facility] in May to follow up kidney function   Brooke Stafford A    Labs: Basic Metabolic Panel:  Recent Labs Lab 07/26/15 0326 07/27/15 0232 07/28/15 0302  NA 141 140 139  K 4.2 4.1 4.4  CL 106 105 105  CO2 26 25 26   GLUCOSE 209* 209* 228*  BUN 27* 36* 42*  CREATININE 2.54* 2.72* 2.74*  CALCIUM 9.3 9.2 9.3   Liver Function Tests: No results for input(s): AST, ALT, ALKPHOS, BILITOT, PROT, ALBUMIN in the last 168 hours. No results for input(s): LIPASE, AMYLASE in the last 168 hours. No results for input(s): AMMONIA in the last 168 hours. CBC:  Recent Labs Lab 07/25/15 1125 07/27/15 0232  WBC 5.8 5.1  HGB 10.4* 9.5*  HCT 30.1* 29.5*  MCV 91.8 93.4  PLT 203 172   Cardiac Enzymes:  Recent Labs Lab 07/25/15 2037 07/26/15 0326  TROPONINI <0.03 <0.03   CBG:  Recent Labs Lab 07/27/15 0609 07/27/15 1128 07/27/15 1646 07/27/15 2113 07/28/15 0553  GLUCAP 249* 219* 193* 244* 200*    Iron Studies: No results for input(s): IRON, TIBC, TRANSFERRIN, FERRITIN in the last 72 hours.  Studies/Results: No results found. Medications: Infusions:    Scheduled Medications: . allopurinol  300 mg Oral Daily  . amLODipine  10 mg Oral Daily  . aspirin EC  81 mg Oral Daily  . atorvastatin  40 mg Oral Daily  . cloNIDine  0.3 mg Oral BID  . clopidogrel  75 mg Oral Daily  . docusate sodium  100 mg Oral BID  . doxazosin  2 mg Oral QHS  . enoxaparin (LOVENOX) injection  40 mg Subcutaneous Q24H  . ezetimibe  10 mg Oral Daily  . gabapentin  400 mg Oral TID  . insulin aspart  0-15 Units Subcutaneous TID WC  . insulin aspart  0-5 Units Subcutaneous QHS  . insulin glargine  30 Units Subcutaneous QHS  . isosorbide mononitrate  120 mg Oral Daily  . levothyroxine  25 mcg Oral  QAC breakfast  . magnesium citrate  1 Bottle Oral Once  . metoprolol succinate  150 mg Oral Daily  . traZODone  25 mg Oral QHS    have reviewed scheduled and prn medications.  Physical Exam: General: NAD Heart: RRR Lungs: clear Abdomen: soft, non tender Extremities: minimal edema    07/28/2015,9:46 AM

## 2015-08-01 DIAGNOSIS — E113411 Type 2 diabetes mellitus with severe nonproliferative diabetic retinopathy with macular edema, right eye: Secondary | ICD-10-CM | POA: Diagnosis not present

## 2015-08-01 DIAGNOSIS — E113412 Type 2 diabetes mellitus with severe nonproliferative diabetic retinopathy with macular edema, left eye: Secondary | ICD-10-CM | POA: Diagnosis not present

## 2015-08-01 DIAGNOSIS — H3561 Retinal hemorrhage, right eye: Secondary | ICD-10-CM | POA: Diagnosis not present

## 2015-08-01 DIAGNOSIS — Z961 Presence of intraocular lens: Secondary | ICD-10-CM | POA: Diagnosis not present

## 2015-08-03 DIAGNOSIS — E113411 Type 2 diabetes mellitus with severe nonproliferative diabetic retinopathy with macular edema, right eye: Secondary | ICD-10-CM | POA: Diagnosis not present

## 2015-08-03 DIAGNOSIS — E113412 Type 2 diabetes mellitus with severe nonproliferative diabetic retinopathy with macular edema, left eye: Secondary | ICD-10-CM | POA: Diagnosis not present

## 2015-08-09 DIAGNOSIS — E113412 Type 2 diabetes mellitus with severe nonproliferative diabetic retinopathy with macular edema, left eye: Secondary | ICD-10-CM | POA: Diagnosis not present

## 2015-08-09 DIAGNOSIS — E113411 Type 2 diabetes mellitus with severe nonproliferative diabetic retinopathy with macular edema, right eye: Secondary | ICD-10-CM | POA: Diagnosis not present

## 2015-08-09 DIAGNOSIS — H3561 Retinal hemorrhage, right eye: Secondary | ICD-10-CM | POA: Diagnosis not present

## 2015-08-24 ENCOUNTER — Encounter: Payer: Self-pay | Admitting: Internal Medicine

## 2015-08-24 ENCOUNTER — Ambulatory Visit: Payer: Medicare Other | Attending: Internal Medicine | Admitting: Internal Medicine

## 2015-08-24 VITALS — BP 139/73 | HR 73 | Temp 99.0°F | Resp 18 | Ht 70.0 in | Wt 250.6 lb

## 2015-08-24 DIAGNOSIS — I200 Unstable angina: Secondary | ICD-10-CM

## 2015-08-24 DIAGNOSIS — I10 Essential (primary) hypertension: Secondary | ICD-10-CM | POA: Insufficient documentation

## 2015-08-24 DIAGNOSIS — Z79899 Other long term (current) drug therapy: Secondary | ICD-10-CM | POA: Insufficient documentation

## 2015-08-24 DIAGNOSIS — R21 Rash and other nonspecific skin eruption: Secondary | ICD-10-CM | POA: Diagnosis not present

## 2015-08-24 DIAGNOSIS — E1140 Type 2 diabetes mellitus with diabetic neuropathy, unspecified: Secondary | ICD-10-CM

## 2015-08-24 DIAGNOSIS — F4321 Adjustment disorder with depressed mood: Secondary | ICD-10-CM | POA: Insufficient documentation

## 2015-08-24 DIAGNOSIS — F4320 Adjustment disorder, unspecified: Secondary | ICD-10-CM | POA: Diagnosis not present

## 2015-08-24 DIAGNOSIS — Z880 Allergy status to penicillin: Secondary | ICD-10-CM | POA: Diagnosis not present

## 2015-08-24 DIAGNOSIS — Z794 Long term (current) use of insulin: Secondary | ICD-10-CM | POA: Insufficient documentation

## 2015-08-24 LAB — CBC WITH DIFFERENTIAL/PLATELET
BASOS ABS: 62 {cells}/uL (ref 0–200)
Basophils Relative: 1 %
EOS ABS: 434 {cells}/uL (ref 15–500)
Eosinophils Relative: 7 %
HEMATOCRIT: 32.1 % — AB (ref 35.0–45.0)
HEMOGLOBIN: 10.7 g/dL — AB (ref 11.7–15.5)
Lymphocytes Relative: 18 %
Lymphs Abs: 1116 cells/uL (ref 850–3900)
MCH: 30.2 pg (ref 27.0–33.0)
MCHC: 33.3 g/dL (ref 32.0–36.0)
MCV: 90.7 fL (ref 80.0–100.0)
MONO ABS: 248 {cells}/uL (ref 200–950)
MONOS PCT: 4 %
MPV: 9 fL (ref 7.5–12.5)
NEUTROS ABS: 4340 {cells}/uL (ref 1500–7800)
Neutrophils Relative %: 70 %
PLATELETS: 222 10*3/uL (ref 140–400)
RBC: 3.54 MIL/uL — ABNORMAL LOW (ref 3.80–5.10)
RDW: 15.4 % — ABNORMAL HIGH (ref 11.0–15.0)
WBC: 6.2 10*3/uL (ref 3.8–10.8)

## 2015-08-24 LAB — GLUCOSE, POCT (MANUAL RESULT ENTRY): POC Glucose: 156 mg/dl — AB (ref 70–99)

## 2015-08-24 MED ORDER — ACETAMINOPHEN-CODEINE #3 300-30 MG PO TABS: 1.0000 | ORAL_TABLET | ORAL | Status: DC | PRN

## 2015-08-24 MED ORDER — ALPRAZOLAM 0.25 MG PO TABS: 0.2500 mg | ORAL_TABLET | Freq: Every evening | ORAL | Status: DC | PRN

## 2015-08-24 MED ORDER — TRIAMCINOLONE ACETONIDE 0.5 % EX OINT: 1.0000 "application " | TOPICAL_OINTMENT | Freq: Two times a day (BID) | CUTANEOUS | Status: DC

## 2015-08-24 NOTE — Progress Notes (Signed)
Patient is here for depression  Patient complains of right arm pain radiating down from her shoulder to hand.  Patient complains of HA being present of left temple. Pain is scaled currently at a 10.

## 2015-08-24 NOTE — Progress Notes (Signed)
Patient ID: Brooke Stafford, female   DOB: 1955-08-04, 60 y.o.   MRN: [id number]   Brooke Stafford, is a 60 y.o. female  [id number]  [id number]  DOB - 08-11-1955  Chief Complaint  Patient presents with  . Depression        Subjective:   Brooke Stafford is a 60 y.o. female with history of STEMI s/p CABG in 2002, HTN, GERD, CKD, obesity, Gout and CHF here today for hospital discharge follow up visit. Patient was recently admitted to the hospital for atypical chest pain. She ruled out for acute MI but EKG showed TWI in lateral leads. Cardiologist, Dr. Radford Pax was consulted who felt her main problem was acute on chronic systolic CHF and no acute coronary event, he recommended continue aspirin, plavix, beta blocker, statin, discontinued ACEI for worsening CKD. 2-D echocardiogram showed EF of 50-55% with grade 2 diastolic dysfunction. Unfortunately, while in the hospital, patient's son was shot dead in [place]. She has since been grieving, anxious, tearful and sleepless. She denies suicidal thoughts but very sad about the loss of her only son who was 5 years old and doing "well" before he was killed. Chest pain has resolved, no abdominal pain, no cough or SOB. She denies palpitations, nausea, or vomiting. She denies lower extremity edema or orthopnea. She denies fever, chills, or decreased oral intake. She denies dysuria, hematuria, frequency or urgency. She finds great support in her ex-husband who is the father of the diseased. She also has a rash developing on her back and lower abdominal area, slightly itchy.   Left Heart Cath of 12/12/2014: Showed:  Prox RCA to Mid RCA lesion, 80% stenosed.  Dist RCA lesion, 95% stenosed.  was injected is normal in caliber.  The graft exhibits minimal luminal irregularities.  Ost LAD to Prox LAD lesion, 100% stenosed.  SVG is normal in caliber, and is anatomically normal.  A drug-eluting stent was placed in past.  LIMA is normal in caliber,  and is anatomically normal.  Mid Cx to Dist Cx lesion, 30% stenosed.  LM lesion, 40% stenosed.  Ramus lesion, 90% stenosed. There is a 0% residual stenosis post intervention. The lesion was previously treated with a stent (unknown type) greater than two years ago.  A drug-eluting stent was placed.  1. Severe 3 vessel obstructive CAD 2. Patent LIMA to LAD 3. Patent SVG to first diagonal- it also fills second diagonal. Stent in SVG is patent. 4. Patent SVG to PDA 5. Occluded SVG to ramus intermediate. Chronic 6. Successful stenting of the proximal ramus intermediate with a DES.  7. Elevated LV EDP  Plan: Continue DAPT for at least one year. Will increase lasix to 40 mg daily. I do not think that the ramus intermediate stenosis would explain her decrease in EF. Need to maximize medical therapy for CHF.   Problem  Type 2 Diabetes Mellitus With Diabetic Neuropathy, With Long-Term Current Use of Insulin (Hcc)  Htn (Hypertension), Benign  Grief Reaction    ALLERGIES: Allergies  Allergen Reactions  . Hydralazine Shortness Of Breath  . Penicillins Cross Reactors Hives    And high fever  . Adhesive [Tape] Rash    bruising    PAST MEDICAL HISTORY: Past Medical History  Diagnosis Date  . Asthma   . Anxiety   . GERD (gastroesophageal reflux disease)   . Coronary artery disease 2002    CABG x 6. Cath 5/11- med Rx  . Hypertension   . Peripheral vascular disease ([place]) 12/12  LSFA PTA  . Anemia   . Hyperlipidemia   . Chronic renal insufficiency, stage II (mild)     followed  by [facility]  . CAD (coronary artery disease) 2002; 2015    CABG x 6 2002, cath 2011- med Rx stent DES VG-Diag  . Hypothyroid     treated  . Obesity (BMI 35.0-39.9 without comorbidity) ([place])   . CHF (congestive heart failure) ([place])     "in 2002" (11/26/2012)  . Chronic bronchitis ([place])     "q year; in the winter"   . Type II diabetes mellitus ([place])   . History of blood transfusion 2002     "when I had OHS"  . Gout     "right big toe"  . Anginal pain ([place])   . CAD (coronary artery disease) of artery bypass graft; DES to VG-Diag 09/28/13 11/09/2013  . Myocardial infarction ([place]) 2000; 2002; 2011  . Pneumonia     "3 times I think" (12/12/2014)  . Migraines     "couple times/year" (08/04/2013), (12/12/2014)  . Headache     "~ q week" (08/04/2013); "~ twice/month" (12/12/2014)  . Arthritis     "stiff fingers and knees" (08/04/2013), (12/12/2014)    MEDICATIONS AT HOME: Prior to Admission medications   Medication Sig Start Date End Date Taking? Authorizing Provider  acetaminophen-codeine (TYLENOL #3) 300-30 MG tablet Take 1 tablet by mouth every 4 (four) hours as needed for moderate pain. 07/28/15   Ripudeep Krystal Eaton, MD  albuterol (PROVENTIL HFA;VENTOLIN HFA) 108 (90 BASE) MCG/ACT inhaler Inhale 2 puffs into the lungs every 6 (six) hours as needed for wheezing or shortness of breath. 08/11/14   Tresa Garter, MD  allopurinol (ZYLOPRIM) 300 MG tablet Take 1 tablet (300 mg total) by mouth daily. 07/28/15   Ripudeep Krystal Eaton, MD  ALPRAZolam Duanne Moron) 0.25 MG tablet Take 1 tablet (0.25 mg total) by mouth at bedtime as needed for anxiety. 08/24/15   Tresa Garter, MD  amLODipine (NORVASC) 10 MG tablet Take 1 tablet (10 mg total) by mouth daily. 07/28/15   Ripudeep Krystal Eaton, MD  aspirin 81 MG chewable tablet Chew 1 tablet (81 mg total) by mouth daily. 02/24/15   Mihai Croitoru, MD  atorvastatin (LIPITOR) 40 MG tablet Take 1 tablet (40 mg total) by mouth daily. 07/28/15   Ripudeep Krystal Eaton, MD  cloNIDine (CATAPRES) 0.3 MG tablet Take 1 tablet (0.3 mg total) by mouth 2 (two) times daily. 07/28/15   Ripudeep Krystal Eaton, MD  clopidogrel (PLAVIX) 75 MG tablet Take 1 tablet (75 mg total) by mouth daily. 07/28/15   Ripudeep Krystal Eaton, MD  cyclobenzaprine (FLEXERIL) 10 MG tablet Take 1 tablet (10 mg total) by mouth 3 (three) times daily as needed for muscle spasms. 01/27/14   Tresa Garter, MD  docusate sodium  (COLACE) 100 MG capsule Take 1 capsule (100 mg total) by mouth 2 (two) times daily. constipation 07/28/15   Ripudeep Krystal Eaton, MD  ezetimibe (ZETIA) 10 MG tablet Take 1 tablet (10 mg total) by mouth daily. 07/28/15   Ripudeep Krystal Eaton, MD  fluticasone (FLONASE) 50 MCG/ACT nasal spray Place 2 sprays into both nostrils daily as needed for allergies or rhinitis. 08/11/14   Tresa Garter, MD  furosemide (LASIX) 40 MG tablet Take 1 tablet (40 mg total) by mouth daily. 07/28/15   Ripudeep Krystal Eaton, MD  gabapentin (NEURONTIN) 400 MG capsule take 1 capsule by mouth three times a day 02/16/15   Shemuel Harkleroad  Essie Christine, MD  insulin aspart (NOVOLOG FLEXPEN) 100 UNIT/ML FlexPen Inject 10-18 units into the skin 3 times daily, plus sliding scale as instructed. 03/31/15   Philemon Kingdom, MD  Insulin Glargine (TOUJEO SOLOSTAR) 300 UNIT/ML SOPN Inject 40 Units into the skin at bedtime. 03/28/15   Philemon Kingdom, MD  Insulin Pen Needle 31G X 5 MM MISC Use to inject insulin 4 times daily as instructed. 02/16/15   Philemon Kingdom, MD  Insulin Syringes, Disposable, (B-D INSULIN SYRINGE 1CC) U-100 1 ML MISC Use to inject insulin 4 times daily as instructed. 02/22/14   Philemon Kingdom, MD  isosorbide mononitrate (IMDUR) 120 MG 24 hr tablet Take 1 tablet (120 mg total) by mouth daily. 07/28/15   Ripudeep Krystal Eaton, MD  levothyroxine (SYNTHROID, LEVOTHROID) 25 MCG tablet take 1 tablet by mouth every morning BEFORE BREAKFAST 07/28/15   Ripudeep Krystal Eaton, MD  loratadine (CLARITIN) 10 MG tablet Take 1 tablet (10 mg total) by mouth daily as needed for allergies. 12/15/14   Erlene Quan, PA-C  magnesium citrate SOLN Take 296 mLs (1 Bottle total) by mouth as needed for severe constipation. 07/28/15   Ripudeep Krystal Eaton, MD  Menthol-Camphor (TIGER BALM ARTHRITIS RUB) 11-11 % CREA Apply 1 application topically 2 (two) times daily.    Historical Provider, MD  metoprolol succinate (TOPROL-XL) 50 MG 24 hr tablet Take 3 tablets (150 mg total) by mouth daily.  Take with or immediately following a meal. 07/28/15   Ripudeep Krystal Eaton, MD  nitroGLYCERIN (NITROSTAT) 0.4 MG SL tablet Place 1 tablet (0.4 mg total) under the tongue every 5 (five) minutes as needed. For chest 11/10/14   Tresa Garter, MD  ranitidine (ZANTAC 75) 75 MG tablet Take 1 tablet (75 mg total) by mouth 2 (two) times daily. 07/28/15   Ripudeep Krystal Eaton, MD  traZODone (DESYREL) 50 MG tablet Take 0.5 tablets (25 mg total) by mouth at bedtime. 05/16/14   Tresa Garter, MD  triamcinolone ointment (KENALOG) 0.5 % Apply 1 application topically 2 (two) times daily. 08/24/15   Tresa Garter, MD     Objective:   Filed Vitals:   08/24/15 1636  BP: 139/73  Pulse: 73  Temp: 99 F (37.2 C)  TempSrc: Oral  Resp: 18  Height: 5\' 10"  (1.778 m)  Weight: 250 lb 9.6 oz (113.671 kg)  SpO2: 97%   Exam General appearance : Awake, alert, not in any distress. Speech Clear. Not toxic looking HEENT: Atraumatic and Normocephalic, pupils equally reactive to light and accomodation Neck: supple, no JVD. No cervical lymphadenopathy.  Chest:Good air entry bilaterally, no added sounds  CVS: S1 S2 regular, no murmurs.  Abdomen: Bowel sounds present, Non tender and not distended with no gaurding, rigidity or rebound. Extremities: B/L Lower Ext shows no edema, both legs are warm to touch Neurology: Awake alert, and oriented X 3, CN II-XII intact, Non focal Skin: Ezcematous Rashes  Data Review Lab Results  Component Value Date   HGBA1C 7.2* 07/25/2015   HGBA1C 6.40 02/23/2015   HGBA1C 6.30 11/10/2014     Assessment & Plan   1. Rash of hands  - triamcinolone ointment (KENALOG) 0.5 %; Apply 1 application topically 2 (two) times daily.  Dispense: 30 g; Refill: 2  2. Type 2 diabetes mellitus with diabetic neuropathy, with long-term current use of insulin (HCC)  - CBC with Differential/Platelet - COMPLETE METABOLIC PANEL WITH GFR - Glucose (CBG)  3. HTN (hypertension): Controlled  We have  discussed target  BP range and blood pressure goal. I have advised patient to check BP regularly and to call us back or report to clinic if the numbers are consistently higher than 140/90. We discussed the importance of compliance with medical therapy and DASH diet recommended, consequences of uncontrolled hypertension discussed.   - continue current BP medications  4. Grief reaction  - ALPRAZolam (XANAX) 0.25 MG tablet; Take 1 tablet (0.25 mg total) by mouth at bedtime as needed for anxiety.  Dispense: 30 tablet; Refill: 0 - Patient was counseled extensively - Xanax is prescribed for short term use only - If there is need for chronic anxiolytics, will change to Klonopin and or refer to Psychiatrist  Patient have been counseled extensively about nutrition and exercise  Return in about 3 months (around 11/24/2015) for Hemoglobin A1C and Follow up, DM, Follow up HTN, Follow up Pain and comorbidities.  The patient was given clear instructions to go to ER or return to medical center if symptoms don't improve, worsen or new problems develop. The patient verbalized understanding. The patient was told to call to get lab results if they haven't heard anything in the next week.   This note has been created with Surveyor, quantity. Any transcriptional errors are unintentional.    Angelica Chessman, MD, [place], Karilyn Cota, [place] and [place], [place]   08/24/2015, 4:42 PM

## 2015-08-24 NOTE — Patient Instructions (Signed)
Diabetes and Exercise Exercising regularly is important. It is not just about losing weight. It has many health benefits, such as:  Improving your overall fitness, flexibility, and endurance.  Increasing your bone density.  Helping with weight control.  Decreasing your body fat.  Increasing your muscle strength.  Reducing stress and tension.  Improving your overall health. People with diabetes who exercise gain additional benefits because exercise:  Reduces appetite.  Improves the body's use of blood sugar (glucose).  Helps lower or control blood glucose.  Decreases blood pressure.  Helps control blood lipids (such as cholesterol and triglycerides).  Improves the body's use of the hormone insulin by:  Increasing the body's insulin sensitivity.  Reducing the body's insulin needs.  Decreases the risk for heart disease because exercising:  Lowers cholesterol and triglycerides levels.  Increases the levels of good cholesterol (such as high-density lipoproteins [HDL]) in the body.  Lowers blood glucose levels. YOUR ACTIVITY PLAN  Choose an activity that you enjoy, and set realistic goals. To exercise safely, you should begin practicing any new physical activity slowly, and gradually increase the intensity of the exercise over time. Your health care provider or diabetes educator can help create an activity plan that works for you. General recommendations include:  Encouraging children to engage in at least 60 minutes of physical activity each day.  Stretching and performing strength training exercises, such as yoga or weight lifting, at least 2 times per week.  Performing a total of at least 150 minutes of moderate-intensity exercise each week, such as brisk walking or water aerobics.  Exercising at least 3 days per week, making sure you allow no more than 2 consecutive days to pass without exercising.  Avoiding long periods of inactivity (90 minutes or more). When you  have to spend an extended period of time sitting down, take frequent breaks to walk or stretch. RECOMMENDATIONS FOR EXERCISING WITH TYPE 1 OR TYPE 2 DIABETES   Check your blood glucose before exercising. If blood glucose levels are greater than 240 mg/dL, check for urine ketones. Do not exercise if ketones are present.  Avoid injecting insulin into areas of the body that are going to be exercised. For example, avoid injecting insulin into:  The arms when playing tennis.  The legs when jogging.  Keep a record of:  Food intake before and after you exercise.  Expected peak times of insulin action.  Blood glucose levels before and after you exercise.  The type and amount of exercise you have done.  Review your records with your health care provider. Your health care provider will help you to develop guidelines for adjusting food intake and insulin amounts before and after exercising.  If you take insulin or oral hypoglycemic agents, watch for signs and symptoms of hypoglycemia. They include:  Dizziness.  Shaking.  Sweating.  Chills.  Confusion.  Drink plenty of water while you exercise to prevent dehydration or heat stroke. Body water is lost during exercise and must be replaced.  Talk to your health care provider before starting an exercise program to make sure it is safe for you. Remember, almost any type of activity is better than none.   This information is not intended to replace advice given to you by your health care provider. Make sure you discuss any questions you have with your health care provider.   Document Released: 06/22/2003 Document Revised: 08/16/2014 Document Reviewed: 09/08/2012 Elsevier Interactive Patient Education 2016 Elsevier Inc. Basic Carbohydrate Counting for Diabetes Mellitus Carbohydrate counting   is a method for keeping track of the amount of carbohydrates you eat. Eating carbohydrates naturally increases the level of sugar (glucose) in your  blood, so it is important for you to know the amount that is okay for you to have in every meal. Carbohydrate counting helps keep the level of glucose in your blood within normal limits. The amount of carbohydrates allowed is different for every person. A dietitian can help you calculate the amount that is right for you. Once you know the amount of carbohydrates you can have, you can count the carbohydrates in the foods you want to eat. Carbohydrates are found in the following foods:  Grains, such as breads and cereals.  Dried beans and soy products.  Starchy vegetables, such as potatoes, peas, and corn.  Fruit and fruit juices.  Milk and yogurt.  Sweets and snack foods, such as cake, cookies, candy, chips, soft drinks, and fruit drinks. CARBOHYDRATE COUNTING There are two ways to count the carbohydrates in your food. You can use either of the methods or a combination of both. Reading the "Nutrition Facts" on Packaged Food The "Nutrition Facts" is an area that is included on the labels of almost all packaged food and beverages in the [place]. It includes the serving size of that food or beverage and information about the nutrients in each serving of the food, including the grams (g) of carbohydrate per serving.  Decide the number of servings of this food or beverage that you will be able to eat or drink. Multiply that number of servings by the number of grams of carbohydrate that is listed on the label for that serving. The total will be the amount of carbohydrates you will be having when you eat or drink this food or beverage. Learning Standard Serving Sizes of Food When you eat food that is not packaged or does not include "Nutrition Facts" on the label, you need to measure the servings in order to count the amount of carbohydrates.A serving of most carbohydrate-rich foods contains about 15 g of carbohydrates. The following list includes serving sizes of carbohydrate-rich foods that  provide 15 g ofcarbohydrate per serving:   1 slice of bread (1 oz) or 1 six-inch tortilla.    of a hamburger bun or English muffin.  4-6 crackers.   cup unsweetened dry cereal.    cup hot cereal.   cup rice or pasta.    cup mashed potatoes or  of a large baked potato.  1 cup fresh fruit or one small piece of fruit.    cup canned or frozen fruit or fruit juice.  1 cup milk.   cup plain fat-free yogurt or yogurt sweetened with artificial sweeteners.   cup cooked dried beans or starchy vegetable, such as peas, corn, or potatoes.  Decide the number of standard-size servings that you will eat. Multiply that number of servings by 15 (the grams of carbohydrates in that serving). For example, if you eat 2 cups of strawberries, you will have eaten 2 servings and 30 g of carbohydrates (2 servings x 15 g = 30 g). For foods such as soups and casseroles, in which more than one food is mixed in, you will need to count the carbohydrates in each food that is included. EXAMPLE OF CARBOHYDRATE COUNTING Sample Dinner  3 oz chicken breast.   cup of brown rice.   cup of corn.  1 cup milk.   1 cup strawberries with sugar-free whipped topping.  Carbohydrate Calculation Step   1: Identify the foods that contain carbohydrates:   Rice.   Corn.   Milk.   Strawberries. Step 2:Calculate the number of servings eaten of each:   2 servings of rice.   1 serving of corn.   1 serving of milk.   1 serving of strawberries. Step 3: Multiply each of those number of servings by 15 g:   2 servings of rice x 15 g = 30 g.   1 serving of corn x 15 g = 15 g.   1 serving of milk x 15 g = 15 g.   1 serving of strawberries x 15 g = 15 g. Step 4: Add together all of the amounts to find the total grams of carbohydrates eaten: 30 g + 15 g + 15 g + 15 g = 75 g.   This information is not intended to replace advice given to you by your health care provider. Make sure you  discuss any questions you have with your health care provider.   Document Released: 04/01/2005 Document Revised: 04/22/2014 Document Reviewed: 02/26/2013 Elsevier Interactive Patient Education 2016 [employer]. Complicated Grieving Grief is a normal response to the death of someone close to you. Feelings of fear, anger, and guilt can affect almost everyone who loses a loved one. It is also common to have symptoms of depression while you are grieving. These include problems with sleep, loss of appetite, and lack of energy. They may last for weeks or months after a loss. Complicated grief is different from normal grief or depression. Normal grieving involves sadness and feelings of loss, but these feelings are not constant. Complicated grief is a constant and severe type of grief. It interferes with your ability to function normally. It may last for several months to a year or longer. Complicated grief may require treatment from a mental health care provider. CAUSES  It is not known why some people continue to struggle with grief and others do not. You may be at higher risk for complicated grief if:  The death of your loved one was sudden or unexpected.  The death of your loved one was due to a violent event.  Your loved one committed suicide.  Your loved one was a child or a young person.  You were very close to or dependent on the loved one.  You have a history of depression. SIGNS AND SYMPTOMS Signs and symptoms of complicated grief may include:  Feeling disbelief or numbness.  Being unable to enjoy good memories of your loved one.  Needing to avoid anything that reminds you of your loved one.  Being unable to stop thinking about the death.  Feeling intense anger or guilt.  Feeling alone and hopeless.  Feeling that your life is meaningless and empty.  Losing the desire to live. DIAGNOSIS Your health care provider may diagnose complicated grief if:  You have constant  symptoms of grief for 6-12 months or longer.  Your symptoms are interfering with your ability to live your life. Your health care provider may want you to see a mental health care provider. Many symptoms of depression are similar to the symptoms of complicated grief. It is important to be evaluated for complicated grief along with other mental health conditions. TREATMENT  Talk therapy with a mental health provider is the most common treatment for complicated grief. During therapy, you will learn healthy ways to cope with the loss of your loved one. In some cases, your mental health care provider may  also recommend antidepressant medicines. HOME CARE INSTRUCTIONS  Take care of yourself.  Eat regular meals and maintain a healthy diet. Eat plenty of fruits, vegetables, and whole grains.  Try to get some exercise each day.  Keep regular hours for sleep. Try to get at least 8 hours of sleep each night.  Do not use drugs or alcohol to ease your symptoms.  Take medicines only as directed by your health care provider.  Spend time with friends and loved ones.  Consider joining a grief (bereavement) support group to help you deal with your loss.  Keep all follow-up visits as directed by your health care provider. This is important. SEEK MEDICAL CARE IF:  Your symptoms keep you from functioning normally.  Your symptoms do not get better with treatment. SEEK IMMEDIATE MEDICAL CARE IF:  You have serious thoughts of hurting yourself or someone else.  You have suicidal feelings.   This information is not intended to replace advice given to you by your health care provider. Make sure you discuss any questions you have with your health care provider.   Document Released: 04/01/2005 Document Revised: 12/21/2014 Document Reviewed: 09/09/2013 Elsevier Interactive Patient Education [employer].

## 2015-08-25 LAB — COMPLETE METABOLIC PANEL WITH GFR
ALBUMIN: 3.8 g/dL (ref 3.6–5.1)
ALT: 13 U/L (ref 6–29)
AST: 17 U/L (ref 10–35)
Alkaline Phosphatase: 98 U/L (ref 33–130)
BILIRUBIN TOTAL: 0.4 mg/dL (ref 0.2–1.2)
BUN: 31 mg/dL — ABNORMAL HIGH (ref 7–25)
CHLORIDE: 106 mmol/L (ref 98–110)
CO2: 22 mmol/L (ref 20–31)
Calcium: 9.5 mg/dL (ref 8.6–10.4)
Creat: 2.36 mg/dL — ABNORMAL HIGH (ref 0.50–0.99)
GFR, EST AFRICAN AMERICAN: 25 mL/min — AB (ref >=60)
GFR, EST NON AFRICAN AMERICAN: 22 mL/min — AB (ref >=60)
Glucose, Bld: 126 mg/dL — ABNORMAL HIGH (ref 65–99)
POTASSIUM: 4.6 mmol/L (ref 3.5–5.3)
Sodium: 141 mmol/L (ref 135–146)
TOTAL PROTEIN: 6.7 g/dL (ref 6.1–8.1)

## 2015-09-01 ENCOUNTER — Telehealth: Payer: Self-pay | Admitting: Internal Medicine

## 2015-09-01 NOTE — Telephone Encounter (Signed)
I tried to call back [employer] but could not reach anyone.  This is not an active prescription and patient has not been using this pharmacy - it is located in [place] and she uses [facility] in [place], [place].  I sent back their fax form for their prior authorization to tell them that we did not prescribe this medication and will not be completing the prior authorization.

## 2015-09-01 NOTE — Telephone Encounter (Signed)
Brooke Stafford from [facility] called to check on the status of the Prior authorization of Lidocaine and also the renewal form. Please f/u

## 2015-09-07 ENCOUNTER — Other Ambulatory Visit: Payer: Self-pay | Admitting: Cardiovascular Disease

## 2015-09-08 DIAGNOSIS — N183 Chronic kidney disease, stage 3 (moderate): Secondary | ICD-10-CM | POA: Diagnosis not present

## 2015-09-08 DIAGNOSIS — N184 Chronic kidney disease, stage 4 (severe): Secondary | ICD-10-CM | POA: Diagnosis not present

## 2015-09-08 DIAGNOSIS — E1129 Type 2 diabetes mellitus with other diabetic kidney complication: Secondary | ICD-10-CM | POA: Diagnosis not present

## 2015-09-08 DIAGNOSIS — E039 Hypothyroidism, unspecified: Secondary | ICD-10-CM | POA: Diagnosis not present

## 2015-09-08 DIAGNOSIS — I2510 Atherosclerotic heart disease of native coronary artery without angina pectoris: Secondary | ICD-10-CM | POA: Diagnosis not present

## 2015-09-08 DIAGNOSIS — D631 Anemia in chronic kidney disease: Secondary | ICD-10-CM | POA: Diagnosis not present

## 2015-09-08 DIAGNOSIS — I129 Hypertensive chronic kidney disease with stage 1 through stage 4 chronic kidney disease, or unspecified chronic kidney disease: Secondary | ICD-10-CM | POA: Diagnosis not present

## 2015-09-08 DIAGNOSIS — N2581 Secondary hyperparathyroidism of renal origin: Secondary | ICD-10-CM | POA: Diagnosis not present

## 2015-09-12 ENCOUNTER — Telehealth: Payer: Self-pay | Admitting: *Deleted

## 2015-09-12 ENCOUNTER — Telehealth: Payer: Self-pay | Admitting: Internal Medicine

## 2015-09-12 DIAGNOSIS — E113411 Type 2 diabetes mellitus with severe nonproliferative diabetic retinopathy with macular edema, right eye: Secondary | ICD-10-CM | POA: Diagnosis not present

## 2015-09-12 DIAGNOSIS — E113412 Type 2 diabetes mellitus with severe nonproliferative diabetic retinopathy with macular edema, left eye: Secondary | ICD-10-CM | POA: Diagnosis not present

## 2015-09-12 NOTE — Telephone Encounter (Signed)
Medical Assistant left message on patient's home and cell voicemail. Voicemail states to give a call back to Orlandus Borowski with [facility] at [phone].  

## 2015-09-12 NOTE — Telephone Encounter (Signed)
Pt. Returned call. Please f/u with pt. °

## 2015-09-12 NOTE — Telephone Encounter (Signed)
-----   Message from Tresa Garter, MD sent at 09/06/2015 12:15 PM EDT ----- Please inform patient that her laboratory results are stable, kidney function still low but stable.

## 2015-09-13 ENCOUNTER — Encounter: Payer: Self-pay | Admitting: Internal Medicine

## 2015-09-13 ENCOUNTER — Ambulatory Visit (INDEPENDENT_AMBULATORY_CARE_PROVIDER_SITE_OTHER): Payer: Medicare Other | Admitting: Internal Medicine

## 2015-09-13 VITALS — BP 122/80 | HR 76 | Temp 98.7°F | Resp 12 | Wt 251.0 lb

## 2015-09-13 DIAGNOSIS — E11319 Type 2 diabetes mellitus with unspecified diabetic retinopathy without macular edema: Secondary | ICD-10-CM | POA: Diagnosis not present

## 2015-09-13 DIAGNOSIS — Z794 Long term (current) use of insulin: Secondary | ICD-10-CM

## 2015-09-13 DIAGNOSIS — N183 Chronic kidney disease, stage 3 unspecified: Secondary | ICD-10-CM

## 2015-09-13 DIAGNOSIS — I200 Unstable angina: Secondary | ICD-10-CM

## 2015-09-13 DIAGNOSIS — E1122 Type 2 diabetes mellitus with diabetic chronic kidney disease: Secondary | ICD-10-CM | POA: Diagnosis not present

## 2015-09-13 MED ORDER — INSULIN GLARGINE 300 UNIT/ML ~~LOC~~ SOPN: 40.0000 [IU] | PEN_INJECTOR | Freq: Every day | SUBCUTANEOUS | Status: DC

## 2015-09-13 MED ORDER — INSULIN ASPART 100 UNIT/ML FLEXPEN: PEN_INJECTOR | SUBCUTANEOUS | Status: DC
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
## 2015-09-13 NOTE — Patient Instructions (Signed)
Please continue: - Toujeo 40 units daily - Novolog: 10-15 units before meals If sugars before a meal are 80-150: take the whole amount of mealtime insulin with that meal, but not the Sliding scale If sugars are 60-80, take only half of the mealtime insulin and no sliding scale. If sugars <60, do not take any insulin with that meal. - NovoLog SSI: - 150- 165: + 1 unit  - 166- 180: + 2 units  - 181- 195: + 3 units  - 196- 210: + 4 units  - >210: + 5 units  Please return in 3 months with your sugar log.

## 2015-09-14 DIAGNOSIS — E113411 Type 2 diabetes mellitus with severe nonproliferative diabetic retinopathy with macular edema, right eye: Secondary | ICD-10-CM | POA: Diagnosis not present

## 2015-09-22 ENCOUNTER — Ambulatory Visit (INDEPENDENT_AMBULATORY_CARE_PROVIDER_SITE_OTHER): Payer: Medicare Other | Admitting: Cardiovascular Disease

## 2015-09-22 ENCOUNTER — Encounter: Payer: Self-pay | Admitting: Cardiovascular Disease

## 2015-09-22 VITALS — BP 156/76 | HR 73 | Ht 70.0 in | Wt 248.0 lb

## 2015-09-22 DIAGNOSIS — E785 Hyperlipidemia, unspecified: Secondary | ICD-10-CM | POA: Diagnosis not present

## 2015-09-22 DIAGNOSIS — E1122 Type 2 diabetes mellitus with diabetic chronic kidney disease: Secondary | ICD-10-CM | POA: Diagnosis not present

## 2015-09-22 DIAGNOSIS — I10 Essential (primary) hypertension: Secondary | ICD-10-CM | POA: Diagnosis not present

## 2015-09-22 DIAGNOSIS — I200 Unstable angina: Secondary | ICD-10-CM | POA: Diagnosis not present

## 2015-09-22 DIAGNOSIS — I739 Peripheral vascular disease, unspecified: Secondary | ICD-10-CM

## 2015-09-22 DIAGNOSIS — N183 Chronic kidney disease, stage 3 unspecified: Secondary | ICD-10-CM

## 2015-09-22 DIAGNOSIS — I5042 Chronic combined systolic (congestive) and diastolic (congestive) heart failure: Secondary | ICD-10-CM | POA: Diagnosis not present

## 2015-09-22 DIAGNOSIS — I25708 Atherosclerosis of coronary artery bypass graft(s), unspecified, with other forms of angina pectoris: Secondary | ICD-10-CM

## 2015-09-22 DIAGNOSIS — Z794 Long term (current) use of insulin: Secondary | ICD-10-CM

## 2015-09-22 NOTE — Progress Notes (Signed)
Cardiology Office Note    Date:  09/23/2015   ID:  Brooke, Stafford [birth year], MRN [id number]  PCP:  Brooke Chessman, MD  Cardiologist:   Brooke Klein, MD   Chief Complaint  Patient presents with  . Follow-up    History of Present Illness:  Brooke Stafford is a 60 y.o. female multiple serious medical problems including advanced coronary artery disease with previous bypass surgery and stent placement, chronic systolic and diastolic heart failure with recent acute exacerbation, insulin requiring type 2 diabetes mellitus complicated by chronic kidney disease stage III-IV, peripheral arterial disease, malignant hypertension, hyperlipidemia.  She was hospitalized with chest pain on April 11, the same day her 56 year old son was murdered in [place]. Troponin was normal. It was felt that her chest pain was probably related to acute worsening of heart failure. Her creatinine was elevated at 2.7. Her echocardiogram showed normal regional wall motion and ejection fraction of [id number] , diastolic dysfunction with evidence of increased filling pressures. She had acute worsening of her renal function, was evaluated by Dr. Moshe Cipro and her ACE inhibitor was stopped. Discharge weight was 253 pounds and she is 5 pounds lighter today.  She generally feels well. She has not had any more chest pain. She has chronic class II exertional dyspnea. Her blood pressure today is a little high but she checks it frequently at home when her systolic is typically in the 120s-130s. She believes her blood pressure is a little high today since we have been talking about her son's death. She has been more sedentary since her hospitalization and denies claudication. She does not have a leg edema and denies new focal neurological complaints. Palpitations.  Just saw her endocrinologist about a week ago. Her A1c was 7.2% in April, had been 6;4 last winter. Her creatinine had improved to 2.36 on May 11. LDL  cholesterol was 59 on April 11, although triglycerides were a little high at 221 and HDL was borderline low at 39.  She has a past medical history significant for severe type 2 diabetes mellitus requiring insulin therapy, systemic hypertension, hyperlipidemia, obesity, PVD S/P LSFA PTA initally in 2012 and again in 2014, coronary artery disease, S/P CABG x 6 in 2002. In July 2011 when she presented with sepsis and medication non compliance and had a NSTEMI from demand ischemia. Had mild ischemic cardiomyopathy with a left ventricular ejection fraction of 45-% by echo 5/11. In May 2015 both echo and nuclear stress testing showed normal LV EF around 60%. In June 2015 presented with crescendo angina and received a stent to the SVG to diagonal artery (3.5 x 23 mm Xience [place]). No change in other coronary/graft anatomy, persistent stenosis in the ramus intermedius artery left for medical therapy. Echo in July 2016 showed reduction in EF to 35-40 %, associated with heart failure symptoms. Underwent cath and received new stent to ramus with cath results below: Cath 12/12/14 1. Severe 3 vessel obstructive CAD 2. Patent LIMA to LAD 3. Patent SVG to first diagonal- it also fills second diagonal. Stent in SVG is patent. 4. Patent SVG to PDA 5. Occluded SVG to ramus intermediate. Chronic 6. Successful stenting of the proximal ramus intermediate with a DES.  7. Elevated LV EDP (33 mm Hg)   Post-Intervention Diagram            Implants    Name ID Temporary Type Supply   STENT SYNERGY DES 2.75X20 [id number] No Permanent Stent STENT SYNERGY DES 2.75X20  Past Medical History  Diagnosis Date  . Asthma   . Anxiety   . GERD (gastroesophageal reflux disease)   . Coronary artery disease 2002    CABG x 6. Cath 5/11- med Rx  . Hypertension   . Peripheral vascular disease ([place]) 12/12    LSFA PTA  . Anemia   . Hyperlipidemia   . Chronic renal  insufficiency, stage II (mild)     followed  by [facility]  . CAD (coronary artery disease) 2002; 2015    CABG x 6 2002, cath 2011- med Rx stent DES VG-Diag  . Hypothyroid     treated  . Obesity (BMI 35.0-39.9 without comorbidity) ([place])   . CHF (congestive heart failure) ([place])     "in 2002" (11/26/2012)  . Chronic bronchitis ([place])     "q year; in the winter"   . Type II diabetes mellitus ([place])   . History of blood transfusion 2002    "when I had OHS"  . Gout     "right big toe"  . Anginal pain ([place])   . CAD (coronary artery disease) of artery bypass graft; DES to VG-Diag 09/28/13 11/09/2013  . Myocardial infarction ([place]) 2000; 2002; 2011  . Pneumonia     "3 times I think" (12/12/2014)  . Migraines     "couple times/year" (08/04/2013), (12/12/2014)  . Headache     "~ q week" (08/04/2013); "~ twice/month" (12/12/2014)  . Arthritis     "stiff fingers and knees" (08/04/2013), (12/12/2014)    Past Surgical History  Procedure Laterality Date  . Cholecystectomy  1982  . Cesarean section  1978; 1980  . Tubal ligation  1980  . Breast cyst excision Right 1970's  . Coronary artery bypass graft  11/20/2000    x6 LIMA to distal LAD, svg to first diag, svg to ramus intermediate branch and swquential SVG to cir marginal branch, SVG to posterior descending coronary and sequential SVG to first right posterolateral branch  . Peripheral arterial stent graft Left     SFA/notes 04/07/2011 (11/30/2012)  . Nm myocar perf wall motion  08/27/2004    negative  . Appendectomy  1980  . Abdominal aortagram N/A 04/05/2011    Procedure: ABDOMINAL AORTAGRAM;  Surgeon: Lorretta Harp, MD;  Location: [facility] CATH LAB;  Service: Cardiovascular;  Laterality: N/A;  . Renal angiogram N/A 04/05/2011    Procedure: RENAL ANGIOGRAM;  Surgeon: Lorretta Harp, MD;  Location: [facility] CATH LAB;  Service: Cardiovascular;  Laterality: N/A;  . Lower extremity angiogram  12/01/2012    Procedure: LOWER EXTREMITY ANGIOGRAM;  Surgeon:  Lorretta Harp, MD;  Location: [facility] CATH LAB;  Service: Cardiovascular;;  . Percutaneous stent intervention Left 12/01/2012    Procedure: PERCUTANEOUS STENT INTERVENTION;  Surgeon: Lorretta Harp, MD;  Location: [facility] CATH LAB;  Service: Cardiovascular;  Laterality: Left;  Left SFA  . Left heart catheterization with coronary/graft angiogram N/A 09/28/2013    Procedure: LEFT HEART CATHETERIZATION WITH Beatrix Fetters;  Surgeon: Peter M Martinique, MD;  Location: [facility] CATH LAB;  Service: Cardiovascular;  Laterality: N/A;  . Cardiac catheterization  2002  . Coronary angioplasty with stent placement  2004; 2012    "I have 2 stents" (08/04/2013)  . Coronary angioplasty with stent placement  09/28/13    PTCA/ DES Xience stent to VG-Diag   . Cardiac catheterization N/A 12/12/2014    Procedure: Left Heart Cath and Cors/Grafts Angiography;  Surgeon: Peter M Martinique, MD;  Location: [place] CV LAB;  Service: Cardiovascular;  Laterality: N/A;  . Cardiac catheterization N/A 12/12/2014    Procedure: Coronary Stent Intervention;  Surgeon: Peter M Martinique, MD;  Location: [facility] CV LAB;  Service: Cardiovascular;  Laterality: N/A;    Current Medications: Outpatient Prescriptions Prior to Visit  Medication Sig Dispense Refill  . acetaminophen-codeine (TYLENOL #3) 300-30 MG tablet Take 1 tablet by mouth every 4 (four) hours as needed for moderate pain. 60 tablet 0  . albuterol (PROVENTIL HFA;VENTOLIN HFA) 108 (90 BASE) MCG/ACT inhaler Inhale 2 puffs into the lungs every 6 (six) hours as needed for wheezing or shortness of breath. 3 Inhaler 3  . allopurinol (ZYLOPRIM) 300 MG tablet Take 1 tablet (300 mg total) by mouth daily. 30 tablet 3  . ALPRAZolam (XANAX) 0.25 MG tablet Take 1 tablet (0.25 mg total) by mouth at bedtime as needed for anxiety. 30 tablet 0  . amLODipine (NORVASC) 10 MG tablet Take 1 tablet (10 mg total) by mouth daily. 30 tablet 3  . aspirin 81 MG chewable tablet Chew 1 tablet (81 mg total)  by mouth daily. 30 tablet 10  . atorvastatin (LIPITOR) 40 MG tablet Take 1 tablet (40 mg total) by mouth daily. 30 tablet 3  . cloNIDine (CATAPRES) 0.3 MG tablet Take 1 tablet (0.3 mg total) by mouth 2 (two) times daily. 60 tablet 3  . clopidogrel (PLAVIX) 75 MG tablet Take 1 tablet (75 mg total) by mouth daily. 30 tablet 2  . cyclobenzaprine (FLEXERIL) 10 MG tablet Take 1 tablet (10 mg total) by mouth 3 (three) times daily as needed for muscle spasms. 60 tablet 3  . docusate sodium (COLACE) 100 MG capsule Take 1 capsule (100 mg total) by mouth 2 (two) times daily. constipation 60 capsule 0  . ezetimibe (ZETIA) 10 MG tablet Take 1 tablet (10 mg total) by mouth daily. 30 tablet 3  . fluticasone (FLONASE) 50 MCG/ACT nasal spray Place 2 sprays into both nostrils daily as needed for allergies or rhinitis. 16 g 3  . furosemide (LASIX) 40 MG tablet Take 1 tablet (40 mg total) by mouth daily. 30 tablet 3  . gabapentin (NEURONTIN) 400 MG capsule take 1 capsule by mouth three times a day 270 capsule 3  . insulin aspart (NOVOLOG FLEXPEN) 100 UNIT/ML FlexPen Inject 10-18 units into the skin 3 times daily, plus sliding scale as instructed. 30 mL 5  . Insulin Glargine (TOUJEO SOLOSTAR) 300 UNIT/ML SOPN Inject 40 Units into the skin at bedtime. 6 pen 5  . Insulin Pen Needle 31G X 5 MM MISC Use to inject insulin 4 times daily as instructed. 130 each 5  . Insulin Syringes, Disposable, (B-D INSULIN SYRINGE 1CC) U-100 1 ML MISC Use to inject insulin 4 times daily as instructed. 160 each 4  . isosorbide mononitrate (IMDUR) 120 MG 24 hr tablet Take 1 tablet (120 mg total) by mouth daily. 30 tablet 4  . levothyroxine (SYNTHROID, LEVOTHROID) 25 MCG tablet take 1 tablet by mouth every morning BEFORE BREAKFAST 30 tablet 3  . loratadine (CLARITIN) 10 MG tablet Take 1 tablet (10 mg total) by mouth daily as needed for allergies. 30 tablet 11  . magnesium citrate SOLN Take 296 mLs (1 Bottle total) by mouth as needed for severe  constipation. 195 mL 5  . Menthol-Camphor (TIGER BALM ARTHRITIS RUB) 11-11 % CREA Apply 1 application topically 2 (two) times daily.    . metoprolol succinate (TOPROL-XL) 50 MG 24 hr tablet Take 3 tablets (150 mg total) by mouth  daily. Take with or immediately following a meal. 90 tablet 5  . nitroGLYCERIN (NITROSTAT) 0.4 MG SL tablet Place 1 tablet (0.4 mg total) under the tongue every 5 (five) minutes as needed. For chest 30 tablet 6  . ranitidine (ZANTAC 75) 75 MG tablet Take 1 tablet (75 mg total) by mouth 2 (two) times daily. 60 tablet 3  . traZODone (DESYREL) 50 MG tablet Take 0.5 tablets (25 mg total) by mouth at bedtime. 45 tablet 3  . triamcinolone ointment (KENALOG) 0.5 % Apply 1 application topically 2 (two) times daily. 30 g 2   No facility-administered medications prior to visit.     Allergies:   Digoxin and related; Hydralazine; Lisinopril; Penicillins cross reactors; and Adhesive   Social History   Social History  . Marital Status: Divorced    Spouse Name: N/A  . Number of Children: N/A  . Years of Education: N/A   Social History Main Topics  . Smoking status: Former Smoker -- 1.00 packs/day for 25 years    Types: Cigarettes    Quit date: 04/04/2000  . Smokeless tobacco: Never Used  . Alcohol Use: Yes     Comment: 12/12/2014  "have a glass of red wine on my birthday q yr; that's it"  . Drug Use: No  . Sexual Activity: Not Currently    Birth Control/ Protection: Abstinence   Other Topics Concern  . None   Social History Narrative     Family History:  The patient's family history includes Diabetes in her mother and sister; Hyperlipidemia in her sister; Hypertension in her brother, father, mother, and sister; Stroke in her mother.   ROS:   Please see the history of present illness.    ROS All other systems reviewed and are negative.   PHYSICAL EXAM:   VS:  BP 156/76 mmHg  Pulse 73  Ht 5\' 10"  (1.778 m)  Wt 112.492 kg (248 lb)  BMI 35.58 kg/m2  LMP  10/13/2014 (Exact Date)   GEN: Severely obese, well developed, in no acute distress HEENT: normal Neck: no JVD, carotid bruits, or masses Cardiac: RRR; no murmurs, rubs, or gallops,no edema  Respiratory:  clear to auscultation bilaterally, normal work of breathing GI: soft, nontender, nondistended, + BS MS: no deformity or atrophy Skin: warm and dry, no rash Neuro:  Alert and Oriented x 3, Strength and sensation are intact Psych: euthymic mood, full affect  Wt Readings from Last 3 Encounters:  09/22/15 112.492 kg (248 lb)  09/13/15 113.853 kg (251 lb)  08/24/15 113.671 kg (250 lb 9.6 oz)      Studies/Labs Reviewed:   EKG:  EKG is ordered today.  The ekg ordered April12 demonstrates Sinus rhythm, chronic inferolateral ST segment depression and T-wave inversion  Recent Labs: 07/25/2015: B Natriuretic Peptide 339.5*; TSH 3.124 08/24/2015: ALT 13; BUN 31*; Creat 2.36*; Hemoglobin 10.7*; Platelets 222; Potassium 4.6; Sodium 141   Lipid Panel    Component Value Date/Time   CHOL 142 07/25/2015 2037   CHOL 144 04/20/2015 1036   TRIG 221* 07/25/2015 2037   HDL 39* 07/25/2015 2037   HDL 51 04/20/2015 1036   CHOLHDL 3.6 07/25/2015 2037   VLDL 44* 07/25/2015 2037   LDLCALC 59 07/25/2015 2037   LDLCALC 63 04/20/2015 1036    Additional studies/ records that were reviewed today include:  Records of her recent hospitalization, recent endocrinology visit.    ASSESSMENT:    1. Chronic combined systolic and diastolic heart failure ([place])   2. Coronary  artery disease involving coronary bypass graft of native heart with other forms of angina pectoris ([place])   3. Type 2 diabetes mellitus with stage 3 chronic kidney disease, with long-term current use of insulin ([place])   4. Dyslipidemia   5. HTN (hypertension), malignant, patent renal arteries   6. PVD, LSFA PTA 12/12      PLAN:  In order of problems listed above:  1. Chronic systolic and diastolic heart failure, Recent acute  exacerbation resolved. Clinically she appears to be euvolemic. NYHA functional class II, her baseline. Recent echo shows improvement in left ventricular systolic function by echo. Unfortunately had to stop her lisinopril due to renal insufficiency. She is taking beta blockers a good dose. She was intolerant of hydralazine in the past. Discussed sodium restriction and daily weight monitoring, as his symptoms of heart failure exacerbation. Current dose of diuretics appears to be appropriate. 2. Coronary disease status post bypass surgery,  drug eluting stent to SVG-ramus (Aug 2016) and previous DES to SVG to diagonal (June 2015). She is still taking aspirin and clopidogrel and is on a high-dose statin.  She is still in the target range for possible in-stent restenosis after her last PCI. Currently angina free, and I would be very reluctant to recommend coronary angiography due to her renal insufficiency, barring clearcut evidence of ischemia. 3. Insulin requiring type 2 diabetes mellitus complicated by diabetic nephropathy/CKD stage 3-4, neuropathy and retinopathy.She is intent on working harder to bring her diabetes control back in ideal range. Her creatinine has shown some improvement since her hospitalization but has also shown a steady pattern of deterioration over the last 4-5 years.  4. Hyperlipidemia. At target LDL < 70. Has excellent LDL cholesterol. Slight elevation in triglycerides probably does not justify adding another lipid-lowering agent.The elevation in triglycerides and the deterioration HDL has occurred in parallel with the deterioration in glycemic control. All her lipid parameters were excellent when her hemoglobin A1c was in the 6.4% range. 5. HTN. At this point renal function precludes the use of RAAS inhibitors and she has not done well with hydralazine in the past. The pressure control is adequate at home. Changes to medications today. 6. PAD: She did not have complaints of intermittent  claudication today, but has been less active recently.    Medication Adjustments/Labs and Tests Ordered: Current medicines are reviewed at length with the patient today.  Concerns regarding medicines are outlined above.  Medication changes, Labs and Tests ordered today are listed in the Patient Instructions below. Patient Instructions  Your physician recommends that you continue on your current medications as directed. Please refer to the Current Medication list given to you today.  Dr Sallyanne Kuster recommends that you schedule a follow-up appointment in 6 months. You will receive a reminder letter in the mail two months in advance. If you don't receive a letter, please call our office to schedule the follow-up appointment.  If you need a refill on your cardiac medications before your next appointment, please call your pharmacy.     Signed, Brooke Klein, MD  09/23/2015 6:35 PM    [address], [place], [place]  [address] Phone: [phone]; Fax: [phone]

## 2015-09-22 NOTE — Patient Instructions (Signed)
Your physician recommends that you continue on your current medications as directed. Please refer to the Current Medication list given to you today.  Dr Croitoru recommends that you schedule a follow-up appointment in 6 months. You will receive a reminder letter in the mail two months in advance. If you don't receive a letter, please call our office to schedule the follow-up appointment.  If you need a refill on your cardiac medications before your next appointment, please call your pharmacy. 

## 2015-09-25 ENCOUNTER — Other Ambulatory Visit: Payer: Self-pay | Admitting: Cardiovascular Disease

## 2015-09-25 MED ORDER — CLOPIDOGREL BISULFATE 75 MG PO TABS: 75.0000 mg | ORAL_TABLET | Freq: Every day | ORAL | Status: DC

## 2015-09-25 NOTE — Telephone Encounter (Signed)
Rx(s) sent to pharmacy electronically.  

## 2015-09-26 NOTE — Telephone Encounter (Signed)
Patient verified DOB Patient is aware of kidney function being low but stable.

## 2015-09-28 ENCOUNTER — Encounter: Payer: Self-pay | Admitting: Internal Medicine

## 2015-09-28 ENCOUNTER — Ambulatory Visit: Payer: Medicare Other | Attending: Internal Medicine | Admitting: Internal Medicine

## 2015-09-28 VITALS — BP 161/84 | HR 69 | Temp 99.0°F | Resp 14 | Ht 70.0 in | Wt 246.4 lb

## 2015-09-28 DIAGNOSIS — N183 Chronic kidney disease, stage 3 unspecified: Secondary | ICD-10-CM

## 2015-09-28 DIAGNOSIS — F329 Major depressive disorder, single episode, unspecified: Secondary | ICD-10-CM

## 2015-09-28 DIAGNOSIS — Z794 Long term (current) use of insulin: Secondary | ICD-10-CM | POA: Diagnosis not present

## 2015-09-28 DIAGNOSIS — F4322 Adjustment disorder with anxiety: Secondary | ICD-10-CM | POA: Diagnosis not present

## 2015-09-28 DIAGNOSIS — I200 Unstable angina: Secondary | ICD-10-CM

## 2015-09-28 DIAGNOSIS — F32A Depression, unspecified: Secondary | ICD-10-CM

## 2015-09-28 DIAGNOSIS — E1122 Type 2 diabetes mellitus with diabetic chronic kidney disease: Secondary | ICD-10-CM

## 2015-09-28 DIAGNOSIS — E1129 Type 2 diabetes mellitus with other diabetic kidney complication: Secondary | ICD-10-CM | POA: Diagnosis not present

## 2015-09-28 DIAGNOSIS — J302 Other seasonal allergic rhinitis: Secondary | ICD-10-CM

## 2015-09-28 LAB — GLUCOSE, POCT (MANUAL RESULT ENTRY): POC Glucose: 203 mg/dl — AB (ref 70–99)

## 2015-09-28 MED ORDER — TRAZODONE HCL 50 MG PO TABS: 50.0000 mg | ORAL_TABLET | Freq: Every day | ORAL | Status: DC

## 2015-09-28 MED ORDER — FLUTICASONE PROPIONATE 50 MCG/ACT NA SUSP: 2.0000 | Freq: Every day | NASAL | Status: DC | PRN

## 2015-09-28 MED ORDER — ALBUTEROL SULFATE HFA 108 (90 BASE) MCG/ACT IN AERS: 2.0000 | INHALATION_SPRAY | Freq: Four times a day (QID) | RESPIRATORY_TRACT | Status: DC | PRN

## 2015-09-28 MED ORDER — ALPRAZOLAM 0.25 MG PO TABS: 0.2500 mg | ORAL_TABLET | Freq: Every evening | ORAL | Status: DC | PRN

## 2015-09-28 NOTE — Patient Instructions (Signed)
Chronic Kidney Disease Chronic kidney disease occurs when the kidneys are damaged over a long period. The kidneys are two organs that lie on either side of the spine between the middle of the back and the front of the abdomen. The kidneys:  Remove wastes and extra water from the blood.  Produce important hormones. These help keep bones strong, regulate blood pressure, and help create red blood cells.  Balance the fluids and chemicals in the blood and tissues. A small amount of kidney damage may not cause problems, but a large amount of damage may make it difficult or impossible for the kidneys to work the way they should. If steps are not taken to slow down the kidney damage or stop it from getting worse, the kidneys may stop working permanently. Most of the time, chronic kidney disease does not go away. However, it can often be controlled, and those with the disease can usually live normal lives. CAUSES The most common causes of chronic kidney disease are diabetes and high blood pressure (hypertension). Chronic kidney disease may also be caused by:  Diseases that cause the kidneys' filters to become inflamed.  Diseases that affect the immune system.  Genetic diseases.  Medicines that damage the kidneys, such as anti-inflammatory medicines.  Poisoning or exposure to toxic substances.  A reoccurring kidney or urinary infection.  A problem with urine flow. This may be caused by:  Cancer.  Kidney stones.  An enlarged prostate in males. SIGNS AND SYMPTOMS Because the kidney damage in chronic kidney disease occurs slowly, symptoms develop slowly and may not be obvious until the kidney damage becomes severe. A person may have a kidney disease for years without showing any symptoms. Symptoms can include:  Swelling (edema) of the legs, ankles, or feet.  Tiredness (lethargy).  Nausea or vomiting.  Confusion.  Problems with urination, such as:  Decreased urine  production.  Frequent urination, especially at night.  Frequent accidents in children who are potty trained.  Muscle twitches and cramps.  Shortness of breath.  Weakness.  Persistent itchiness.  Loss of appetite.  Metallic taste in the mouth.  Trouble sleeping.  Slowed development in children.  Short stature in children. DIAGNOSIS Chronic kidney disease may be detected and diagnosed by tests, including blood, urine, imaging, or kidney biopsy tests. TREATMENT Most chronic kidney diseases cannot be cured. Treatment usually involves relieving symptoms and preventing or slowing the progression of the disease. Treatment may include:  A special diet. You may need to avoid alcohol and foods thatare salty and high in potassium.  Medicines. These may:  Lower blood pressure.  Relieve anemia.  Relieve swelling.  Protect the bones. HOME CARE INSTRUCTIONS  Follow your prescribed diet. Your health care provider may instruct you to limit daily salt (sodium) and protein intake.  Take medicines only as directed by your health care provider. Do not take any new medicines (prescription, over-the-counter, or nutritional supplements) unless approved by your health care provider. Many medicines can worsen your kidney damage or need to have the dose adjusted.   Quit smoking if you smoke. Talk to your health care provider about a smoking cessation program.  Keep all follow-up visits as directed by your health care provider.  Monitor your blood pressure.  Start or continue an exercise plan.  Get immunizations as directed by your health care provider.  Take vitamin and mineral supplements as directed by your health care provider. SEEK IMMEDIATE MEDICAL CARE IF:  Your symptoms get worse or you develop  new symptoms.  You develop symptoms of end-stage kidney disease. These include:  Headaches.  Abnormally dark or light skin.  Numbness in the hands or feet.  Easy  bruising.  Frequent hiccups.  Menstruation stops.  You have a fever.  You have decreased urine production.  You havepain or bleeding when urinating. MAKE SURE YOU:  Understand these instructions.  Will watch your condition.  Will get help right away if you are not doing well or get worse. FOR MORE INFORMATION   American Association of Kidney Patients: BombTimer.gl  National Kidney Foundation: www.kidney.[place]: https://mathis.com/  [facility]: www.lifeoptions.org and www.kidneyschool.org   This information is not intended to replace advice given to you by your health care provider. Make sure you discuss any questions you have with your health care provider.   Document Released: 01/09/2008 Document Revised: 04/22/2014 Document Reviewed: 11/29/2011 Elsevier Interactive Patient Education 2016 [place] for Diabetes Mellitus Carbohydrate counting is a method for keeping track of the amount of carbohydrates you eat. Eating carbohydrates naturally increases the level of sugar (glucose) in your blood, so it is important for you to know the amount that is okay for you to have in every meal. Carbohydrate counting helps keep the level of glucose in your blood within normal limits. The amount of carbohydrates allowed is different for every person. A dietitian can help you calculate the amount that is right for you. Once you know the amount of carbohydrates you can have, you can count the carbohydrates in the foods you want to eat. Carbohydrates are found in the following foods:  Grains, such as breads and cereals.  Dried beans and soy products.  Starchy vegetables, such as potatoes, peas, and corn.  Fruit and fruit juices.  Milk and yogurt.  Sweets and snack foods, such as cake, cookies, candy, chips, soft drinks, and fruit drinks. CARBOHYDRATE COUNTING There are two ways to count the carbohydrates in your  food. You can use either of the methods or a combination of both. Reading the "Nutrition Facts" on [place] The "Nutrition Facts" is an area that is included on the labels of almost all packaged food and beverages in the [place]. It includes the serving size of that food or beverage and information about the nutrients in each serving of the food, including the grams (g) of carbohydrate per serving.  Decide the number of servings of this food or beverage that you will be able to eat or drink. Multiply that number of servings by the number of grams of carbohydrate that is listed on the label for that serving. The total will be the amount of carbohydrates you will be having when you eat or drink this food or beverage. Learning Standard Serving Sizes of Food When you eat food that is not packaged or does not include "Nutrition Facts" on the label, you need to measure the servings in order to count the amount of carbohydrates.A serving of most carbohydrate-rich foods contains about 15 g of carbohydrates. The following list includes serving sizes of carbohydrate-rich foods that provide 15 g ofcarbohydrate per serving:   1 slice of bread (1 oz) or 1 six-inch tortilla.    of a hamburger bun or English muffin.  4-6 crackers.   cup unsweetened dry cereal.    cup hot cereal.   cup rice or pasta.    cup mashed potatoes or  of a large baked potato.  1 cup fresh fruit or one  small piece of fruit.    cup canned or frozen fruit or fruit juice.  1 cup milk.   cup plain fat-free yogurt or yogurt sweetened with artificial sweeteners.   cup cooked dried beans or starchy vegetable, such as peas, corn, or potatoes.  Decide the number of standard-size servings that you will eat. Multiply that number of servings by 15 (the grams of carbohydrates in that serving). For example, if you eat 2 cups of strawberries, you will have eaten 2 servings and 30 g of carbohydrates (2 servings x 15 g  = 30 g). For foods such as soups and casseroles, in which more than one food is mixed in, you will need to count the carbohydrates in each food that is included. EXAMPLE OF CARBOHYDRATE COUNTING Sample Dinner  3 oz chicken breast.   cup of brown rice.   cup of corn.  1 cup milk.   1 cup strawberries with sugar-free whipped topping.  Carbohydrate Calculation Step 1: Identify the foods that contain carbohydrates:   Rice.   Corn.   Milk.   Strawberries. Step 2:Calculate the number of servings eaten of each:   2 servings of rice.   1 serving of corn.   1 serving of milk.   1 serving of strawberries. Step 3: Multiply each of those number of servings by 15 g:   2 servings of rice x 15 g = 30 g.   1 serving of corn x 15 g = 15 g.   1 serving of milk x 15 g = 15 g.   1 serving of strawberries x 15 g = 15 g. Step 4: Add together all of the amounts to find the total grams of carbohydrates eaten: 30 g + 15 g + 15 g + 15 g = 75 g.   This information is not intended to replace advice given to you by your health care provider. Make sure you discuss any questions you have with your health care provider.   Document Released: 04/01/2005 Document Revised: 04/22/2014 Document Reviewed: 02/26/2013 Elsevier Interactive Patient Education 2016 Elsevier Inc. Generalized Anxiety Disorder Generalized anxiety disorder (GAD) is a mental disorder. It interferes with life functions, including relationships, work, and school. GAD is different from normal anxiety, which everyone experiences at some point in their lives in response to specific life events and activities. Normal anxiety actually helps [place] prepare for and get through these life events and activities. Normal anxiety goes away after the event or activity is over.  GAD causes anxiety that is not necessarily related to specific events or activities. It also causes excess anxiety in proportion to specific events or  activities. The anxiety associated with GAD is also difficult to control. GAD can vary from mild to severe. People with severe GAD can have intense waves of anxiety with physical symptoms (panic attacks).  SYMPTOMS The anxiety and worry associated with GAD are difficult to control. This anxiety and worry are related to many life events and activities and also occur more days than not for 6 months or longer. People with GAD also have three or more of the following symptoms (one or more in children):  Restlessness.   Fatigue.  Difficulty concentrating.   Irritability.  Muscle tension.  Difficulty sleeping or unsatisfying sleep. DIAGNOSIS GAD is diagnosed through an assessment by your health care provider. Your health care provider will ask you questions aboutyour mood,physical symptoms, and events in your life. Your health care provider may ask you about your medical  history and use of alcohol or drugs, including prescription medicines. Your health care provider may also do a physical exam and blood tests. Certain medical conditions and the use of certain substances can cause symptoms similar to those associated with GAD. Your health care provider may refer you to a mental health specialist for further evaluation. TREATMENT The following therapies are usually used to treat GAD:   Medication. Antidepressant medication usually is prescribed for long-term daily control. Antianxiety medicines may be added in severe cases, especially when panic attacks occur.   Talk therapy (psychotherapy). Certain types of talk therapy can be helpful in treating GAD by providing support, education, and guidance. A form of talk therapy called cognitive behavioral therapy can teach you healthy ways to think about and react to daily life events and activities.  Stress managementtechniques. These include yoga, meditation, and exercise and can be very helpful when they are practiced regularly. A mental health  specialist can help determine which treatment is best for you. Some people see improvement with one therapy. However, other people require a combination of therapies.   This information is not intended to replace advice given to you by your health care provider. Make sure you discuss any questions you have with your health care provider.   Document Released: 07/27/2012 Document Revised: 04/22/2014 Document Reviewed: 07/27/2012 Elsevier Interactive Patient Education [employer].

## 2015-09-28 NOTE — Progress Notes (Signed)
Pt here for difficulty sleeping. Pt has been dealing with difficulty sleeping for a couple of months now and has been out of trazodone. Pt reports trazodone helps.  Pt denies any pain. Pt has taken her morning medications. Pt needs refill on albuterol inhaler, flonase, trazodone. Pt would like to have her kidney function tested today. Pt has eaten this morning. Pt CBG is 203.

## 2015-09-28 NOTE — Progress Notes (Signed)
Patient ID: Brooke Stafford, female   DOB: 05/30/1955, 60 y.o.   MRN: [id number]   Brooke Stafford, is a 60 y.o. female  [id number]  [id number]  DOB - 10-06-1955  Chief Complaint  Patient presents with  . Insomnia        Subjective:   Brooke Stafford is a 60 y.o. female multiple medical problems including advanced coronary artery disease with previous bypass surgery and stent placement, chronic systolic and diastolic heart failure with recent acute exacerbation, insulin requiring type 2 diabetes mellitus complicated by chronic kidney disease stage III-IV, peripheral arterial disease, malignant hypertension, and hyperlipidemia here today for a follow up visit.Her son was murdered recently, since then she has had major depression and anxiety and has suffered significant insomnia, still having panic attacks and anxiety. She claims compliant on all her medications, she reports no side effects. Her most recent hemoglobin A1c in April was 7.2%. She is slowly getting back to her routine walking as a form of exercise. Her blood pressure today is a little elevated but she claims at home her systolic is typically in the 120s to 130s. Patient has No headache, No chest pain, No abdominal pain - No Nausea, No new weakness tingling or numbness, No Cough - SOB. She needs refills on some of her medications. She would like to restart trazodone because it used to help especially with her insomnia.   Problem  Depression    ALLERGIES: Allergies  Allergen Reactions  . Digoxin And Related Other (See Comments)    Patient stated she almost died. Had flu like symptoms as well as diarrhea.  . Hydralazine Shortness Of Breath  . Lisinopril Other (See Comments)    Felt like she had the flu. Was very sick!!!  . Penicillins Cross Reactors Hives    And high fever  . Adhesive [Tape] Rash    bruising    PAST MEDICAL HISTORY: Past Medical History  Diagnosis Date  . Asthma   . Anxiety   . GERD  (gastroesophageal reflux disease)   . Coronary artery disease 2002    CABG x 6. Cath 5/11- med Rx  . Hypertension   . Peripheral vascular disease ([place]) 12/12    LSFA PTA  . Anemia   . Hyperlipidemia   . Chronic renal insufficiency, stage II (mild)     followed  by [facility]  . CAD (coronary artery disease) 2002; 2015    CABG x 6 2002, cath 2011- med Rx stent DES VG-Diag  . Hypothyroid     treated  . Obesity (BMI 35.0-39.9 without comorbidity) ([place])   . CHF (congestive heart failure) ([place])     "in 2002" (11/26/2012)  . Chronic bronchitis ([place])     "q year; in the winter"   . Type II diabetes mellitus ([place])   . History of blood transfusion 2002    "when I had OHS"  . Gout     "right big toe"  . Anginal pain ([place])   . CAD (coronary artery disease) of artery bypass graft; DES to VG-Diag 09/28/13 11/09/2013  . Myocardial infarction ([place]) 2000; 2002; 2011  . Pneumonia     "3 times I think" (12/12/2014)  . Migraines     "couple times/year" (08/04/2013), (12/12/2014)  . Headache     "~ q week" (08/04/2013); "~ twice/month" (12/12/2014)  . Arthritis     "stiff fingers and knees" (08/04/2013), (12/12/2014)    MEDICATIONS AT HOME: Prior to Admission medications   Medication Sig Start  Date End Date Taking? Authorizing Provider  acetaminophen-codeine (TYLENOL #3) 300-30 MG tablet Take 1 tablet by mouth every 4 (four) hours as needed for moderate pain. 08/24/15  Yes Tresa Garter, MD  albuterol (PROVENTIL HFA;VENTOLIN HFA) 108 (90 Base) MCG/ACT inhaler Inhale 2 puffs into the lungs every 6 (six) hours as needed for wheezing or shortness of breath. 09/28/15  Yes Tresa Garter, MD  allopurinol (ZYLOPRIM) 300 MG tablet Take 1 tablet (300 mg total) by mouth daily. 07/28/15  Yes Ripudeep Krystal Eaton, MD  amLODipine (NORVASC) 10 MG tablet Take 1 tablet (10 mg total) by mouth daily. 07/28/15  Yes Ripudeep Krystal Eaton, MD  aspirin 81 MG chewable tablet Chew 1 tablet (81 mg total) by mouth daily.  02/24/15  Yes Mihai Croitoru, MD  atorvastatin (LIPITOR) 40 MG tablet Take 1 tablet (40 mg total) by mouth daily. 07/28/15  Yes Ripudeep Krystal Eaton, MD  cloNIDine (CATAPRES) 0.3 MG tablet Take 1 tablet (0.3 mg total) by mouth 2 (two) times daily. 07/28/15  Yes Ripudeep Krystal Eaton, MD  clopidogrel (PLAVIX) 75 MG tablet Take 1 tablet (75 mg total) by mouth daily. 09/25/15  Yes Mihai Croitoru, MD  cyclobenzaprine (FLEXERIL) 10 MG tablet Take 1 tablet (10 mg total) by mouth 3 (three) times daily as needed for muscle spasms. 01/27/14  Yes Tresa Garter, MD  ezetimibe (ZETIA) 10 MG tablet Take 1 tablet (10 mg total) by mouth daily. 07/28/15  Yes Ripudeep Krystal Eaton, MD  fluticasone (FLONASE) 50 MCG/ACT nasal spray Place 2 sprays into both nostrils daily as needed for allergies or rhinitis. 09/28/15  Yes Tresa Garter, MD  furosemide (LASIX) 40 MG tablet Take 1 tablet (40 mg total) by mouth daily. 07/28/15  Yes Ripudeep Krystal Eaton, MD  gabapentin (NEURONTIN) 400 MG capsule take 1 capsule by mouth three times a day 02/16/15  Yes Brayn Eckstein E Doreene Burke, MD  insulin aspart (NOVOLOG FLEXPEN) 100 UNIT/ML FlexPen Inject 10-18 units into the skin 3 times daily, plus sliding scale as instructed. 09/13/15  Yes Philemon Kingdom, MD  Insulin Glargine (TOUJEO SOLOSTAR) 300 UNIT/ML SOPN Inject 40 Units into the skin at bedtime. 09/13/15  Yes Philemon Kingdom, MD  Insulin Pen Needle 31G X 5 MM MISC Use to inject insulin 4 times daily as instructed. 02/16/15  Yes Philemon Kingdom, MD  isosorbide mononitrate (IMDUR) 120 MG 24 hr tablet Take 1 tablet (120 mg total) by mouth daily. 07/28/15  Yes Ripudeep Krystal Eaton, MD  levothyroxine (SYNTHROID, LEVOTHROID) 25 MCG tablet take 1 tablet by mouth every morning BEFORE BREAKFAST 07/28/15  Yes Ripudeep K Rai, MD  loratadine (CLARITIN) 10 MG tablet Take 1 tablet (10 mg total) by mouth daily as needed for allergies. 12/15/14  Yes Luke K Kilroy, PA-C  magnesium citrate SOLN Take 296 mLs (1 Bottle total) by mouth  as needed for severe constipation. 07/28/15  Yes Ripudeep Krystal Eaton, MD  Menthol-Camphor (TIGER BALM ARTHRITIS RUB) 11-11 % CREA Apply 1 application topically 2 (two) times daily.   Yes Historical Provider, MD  metoprolol succinate (TOPROL-XL) 50 MG 24 hr tablet Take 3 tablets (150 mg total) by mouth daily. Take with or immediately following a meal. 07/28/15  Yes Ripudeep K Rai, MD  nitroGLYCERIN (NITROSTAT) 0.4 MG SL tablet Place 1 tablet (0.4 mg total) under the tongue every 5 (five) minutes as needed. For chest 11/10/14  Yes Parker Wherley Essie Christine, MD  traZODone (DESYREL) 50 MG tablet Take 1 tablet (50 mg total) by mouth at  bedtime. 09/28/15  Yes Tresa Garter, MD  triamcinolone ointment (KENALOG) 0.5 % Apply 1 application topically 2 (two) times daily. 08/24/15  Yes Tresa Garter, MD  ALPRAZolam (XANAX) 0.25 MG tablet Take 1 tablet (0.25 mg total) by mouth at bedtime as needed for anxiety. 09/28/15   Tresa Garter, MD  docusate sodium (COLACE) 100 MG capsule Take 1 capsule (100 mg total) by mouth 2 (two) times daily. constipation Patient not taking: Reported on 09/28/2015 07/28/15   Ripudeep Krystal Eaton, MD  Insulin Syringes, Disposable, (B-D INSULIN SYRINGE 1CC) U-100 1 ML MISC Use to inject insulin 4 times daily as instructed. Patient not taking: Reported on 09/28/2015 02/22/14   Philemon Kingdom, MD  ranitidine (ZANTAC 75) 75 MG tablet Take 1 tablet (75 mg total) by mouth 2 (two) times daily. Patient not taking: Reported on 09/28/2015 07/28/15   Ripudeep Krystal Eaton, MD     Objective:   Filed Vitals:   09/28/15 1053  BP: 161/84  Pulse: 69  Temp: 99 F (37.2 C)  TempSrc: Oral  Resp: 14  Height: 5\' 10"  (1.778 m)  Weight: 246 lb 6.4 oz (111.766 kg)  SpO2: 96%    Exam General appearance : Awake, alert, not in any distress. Speech Clear. Not toxic looking, obese HEENT: Atraumatic and Normocephalic, pupils equally reactive to light and accomodation Neck: Supple, no JVD. No cervical  lymphadenopathy.  Chest: Healed midline surgical scar, Good air entry bilaterally, no added sounds  CVS: S1 S2 regular, no murmurs.  Abdomen: Bowel sounds present, Non tender and not distended with no gaurding, rigidity or rebound. Extremities: B/L Lower Ext shows no edema, both legs are warm to touch Neurology: Awake alert, and oriented X 3, CN II-XII intact, Non focal Skin: No Rash  Data Review Lab Results  Component Value Date   HGBA1C 7.2* 07/25/2015   HGBA1C 6.40 02/23/2015   HGBA1C 6.30 11/10/2014     Assessment & Plan   1. Type 2 diabetes mellitus with stage 3 chronic kidney disease, with long-term current use of insulin (HCC)  - Glucose (CBG) - Microalbumin/Creatinine Ratio, Urine  2. Seasonal allergies Refill - albuterol (PROVENTIL HFA;VENTOLIN HFA) 108 (90 Base) MCG/ACT inhaler; Inhale 2 puffs into the lungs every 6 (six) hours as needed for wheezing or shortness of breath.  Dispense: 3 Inhaler; Refill: 3 - fluticasone (FLONASE) 50 MCG/ACT nasal spray; Place 2 sprays into both nostrils daily as needed for allergies or rhinitis.  Dispense: 16 g; Refill: 3  3. Depression: With insomnia Restart - traZODone (DESYREL) 50 MG tablet; Take 1 tablet (50 mg total) by mouth at bedtime.  Dispense: 90 tablet; Refill: 3  4. Adjustment disorder with anxious mood: due to sudden death of her son  She is slowly coming out of this but still have occasional panic attacks - ALPRAZolam (XANAX) 0.25 MG tablet; Take 1 tablet (0.25 mg total) by mouth at bedtime as needed for anxiety.  Dispense: 30 tablet; Refill: 0  Patient have been counseled extensively about nutrition and exercise  Return in about 2 months (around 11/28/2015) for Hemoglobin A1C and Follow up, DM, Follow up HTN, Generalized Anxiety Disorder.  The patient was given clear instructions to go to ER or return to medical center if symptoms don't improve, worsen or new problems develop. The patient verbalized understanding. The  patient was told to call to get lab results if they haven't heard anything in the next week.   This note has been created with Dragon speech  Land. Any transcriptional errors are unintentional.    Angelica Chessman, MD, [place], Karilyn Cota, [place] and [place], [place]   09/28/2015, 11:58 AM

## 2015-09-29 LAB — MICROALBUMIN / CREATININE URINE RATIO
Creatinine, Urine: 169 mg/dL (ref 20–320)
Microalb, Ur: >600 mg/dL — ABNORMAL HIGH

## 2015-10-19 DIAGNOSIS — E113411 Type 2 diabetes mellitus with severe nonproliferative diabetic retinopathy with macular edema, right eye: Secondary | ICD-10-CM | POA: Diagnosis not present

## 2015-10-22 ENCOUNTER — Encounter (HOSPITAL_COMMUNITY): Payer: Self-pay

## 2015-10-22 ENCOUNTER — Emergency Department (HOSPITAL_COMMUNITY)
Admission: EM | Admit: 2015-10-22 | Discharge: 2015-10-22 | Disposition: A | Discharge status: Home / Self Care | Payer: Medicare Other | Attending: Emergency Medicine | Admitting: Emergency Medicine

## 2015-10-22 ENCOUNTER — Emergency Department (HOSPITAL_COMMUNITY): Payer: Medicare Other

## 2015-10-22 DIAGNOSIS — M542 Cervicalgia: Secondary | ICD-10-CM

## 2015-10-22 DIAGNOSIS — I252 Old myocardial infarction: Secondary | ICD-10-CM | POA: Diagnosis not present

## 2015-10-22 DIAGNOSIS — Z79899 Other long term (current) drug therapy: Secondary | ICD-10-CM | POA: Insufficient documentation

## 2015-10-22 DIAGNOSIS — R42 Dizziness and giddiness: Secondary | ICD-10-CM

## 2015-10-22 DIAGNOSIS — Z87891 Personal history of nicotine dependence: Secondary | ICD-10-CM | POA: Insufficient documentation

## 2015-10-22 DIAGNOSIS — J45909 Unspecified asthma, uncomplicated: Secondary | ICD-10-CM | POA: Diagnosis not present

## 2015-10-22 DIAGNOSIS — I509 Heart failure, unspecified: Secondary | ICD-10-CM | POA: Diagnosis not present

## 2015-10-22 DIAGNOSIS — N182 Chronic kidney disease, stage 2 (mild): Secondary | ICD-10-CM | POA: Diagnosis not present

## 2015-10-22 DIAGNOSIS — E119 Type 2 diabetes mellitus without complications: Secondary | ICD-10-CM | POA: Insufficient documentation

## 2015-10-22 DIAGNOSIS — Z951 Presence of aortocoronary bypass graft: Secondary | ICD-10-CM | POA: Insufficient documentation

## 2015-10-22 DIAGNOSIS — I130 Hypertensive heart and chronic kidney disease with heart failure and stage 1 through stage 4 chronic kidney disease, or unspecified chronic kidney disease: Secondary | ICD-10-CM | POA: Diagnosis not present

## 2015-10-22 DIAGNOSIS — Z794 Long term (current) use of insulin: Secondary | ICD-10-CM | POA: Diagnosis not present

## 2015-10-22 DIAGNOSIS — Z7982 Long term (current) use of aspirin: Secondary | ICD-10-CM | POA: Diagnosis not present

## 2015-10-22 DIAGNOSIS — E785 Hyperlipidemia, unspecified: Secondary | ICD-10-CM | POA: Diagnosis not present

## 2015-10-22 LAB — BASIC METABOLIC PANEL
Anion gap: 7 (ref 5–15)
BUN: 23 mg/dL — AB (ref 6–20)
CHLORIDE: 107 mmol/L (ref 101–111)
CO2: 25 mmol/L (ref 22–32)
Calcium: 10 mg/dL (ref 8.9–10.3)
Creatinine, Ser: 2.46 mg/dL — ABNORMAL HIGH (ref 0.44–1.00)
GFR calc Af Amer: 23 mL/min — ABNORMAL LOW (ref >60)
GFR calc non Af Amer: 20 mL/min — ABNORMAL LOW (ref >60)
Glucose, Bld: 190 mg/dL — ABNORMAL HIGH (ref 65–99)
POTASSIUM: 4.1 mmol/L (ref 3.5–5.1)
SODIUM: 139 mmol/L (ref 135–145)

## 2015-10-22 LAB — CBC
HEMATOCRIT: 32.2 % — AB (ref 36.0–46.0)
Hemoglobin: 10.5 g/dL — ABNORMAL LOW (ref 12.0–15.0)
MCH: 30 pg (ref 26.0–34.0)
MCHC: 32.6 g/dL (ref 30.0–36.0)
MCV: 92 fL (ref 78.0–100.0)
Platelets: 213 10*3/uL (ref 150–400)
RBC: 3.5 MIL/uL — ABNORMAL LOW (ref 3.87–5.11)
RDW: 14.7 % (ref 11.5–15.5)
WBC: 5.6 10*3/uL (ref 4.0–10.5)

## 2015-10-22 LAB — TSH: TSH: 3.22 u[IU]/mL (ref 0.350–4.500)

## 2015-10-22 NOTE — ED Notes (Signed)
Returns from CT 

## 2015-10-22 NOTE — ED Notes (Signed)
Pt resting quietly.

## 2015-10-22 NOTE — ED Notes (Signed)
MD at bedside. 

## 2015-10-22 NOTE — ED Notes (Signed)
Pt standing at door awaiting discharge.

## 2015-10-22 NOTE — ED Notes (Signed)
Pt states she understands instructions. Home stable with steady gait using cane.

## 2015-10-22 NOTE — ED Provider Notes (Signed)
CSN: [id number]     Arrival date & time 10/22/15  1036 History   First MD Initiated Contact with Patient 10/22/15 1129     Chief Complaint  Patient presents with  . neck stiffness   . panic attacks      (Consider location/radiation/quality/duration/timing/severity/associated sxs/prior Treatment) HPI Comments: Pt comes in cc of neck stiffness and pain. Pt has hx of CKD, IDDM, PVD, CAD. She reports that for the past week she has been having some neck pain. Her pain is in the back of the neck and moves down to her shoulders. Pain is constant, worse with movement. No hx of neck trauma. She denies sore throat, dib. Does indicate hoarseness in her voice. She has no drooling. Pt thinks her thyroid or lymph nodes might have swollen up.  Pt also c/o dizziness. She reports that for the past week she has been having pretty constant dizziness, described as lightheadedness and spinning. She has no focal numbness, tingling, weakness, vision complains. Pt has no hx of strokes. Denies posterior headaches.   ROS 10 Systems reviewed and are negative for acute change except as noted in the HPI.     The history is provided by the patient.    Past Medical History  Diagnosis Date  . Asthma   . Anxiety   . GERD (gastroesophageal reflux disease)   . Coronary artery disease 2002    CABG x 6. Cath 5/11- med Rx  . Hypertension   . Peripheral vascular disease ([place]) 12/12    LSFA PTA  . Anemia   . Hyperlipidemia   . Chronic renal insufficiency, stage II (mild)     followed  by [facility]  . CAD (coronary artery disease) 2002; 2015    CABG x 6 2002, cath 2011- med Rx stent DES VG-Diag  . Hypothyroid     treated  . Obesity (BMI 35.0-39.9 without comorbidity) ([place])   . CHF (congestive heart failure) ([place])     "in 2002" (11/26/2012)  . Chronic bronchitis ([place])     "q year; in the winter"   . Type II diabetes mellitus ([place])   . History of blood transfusion 2002    "when I had OHS"  . Gout      "right big toe"  . Anginal pain ([place])   . CAD (coronary artery disease) of artery bypass graft; DES to VG-Diag 09/28/13 11/09/2013  . Myocardial infarction ([place]) 2000; 2002; 2011  . Pneumonia     "3 times I think" (12/12/2014)  . Migraines     "couple times/year" (08/04/2013), (12/12/2014)  . Headache     "~ q week" (08/04/2013); "~ twice/month" (12/12/2014)  . Arthritis     "stiff fingers and knees" (08/04/2013), (12/12/2014)   Past Surgical History  Procedure Laterality Date  . Cholecystectomy  1982  . Cesarean section  1978; 1980  . Tubal ligation  1980  . Breast cyst excision Right 1970's  . Coronary artery bypass graft  11/20/2000    x6 LIMA to distal LAD, svg to first diag, svg to ramus intermediate branch and swquential SVG to cir marginal branch, SVG to posterior descending coronary and sequential SVG to first right posterolateral branch  . Peripheral arterial stent graft Left     SFA/notes 04/07/2011 (11/30/2012)  . Nm myocar perf wall motion  08/27/2004    negative  . Appendectomy  1980  . Abdominal aortagram N/A 04/05/2011    Procedure: ABDOMINAL AORTAGRAM;  Surgeon: Lorretta Harp, MD;  Location:  [facility] [facility] CATH LAB;  Service: Cardiovascular;  Laterality: N/A;  . Renal angiogram N/A 04/05/2011    Procedure: RENAL ANGIOGRAM;  Surgeon: Lorretta Harp, MD;  Location: [facility] CATH LAB;  Service: Cardiovascular;  Laterality: N/A;  . Lower extremity angiogram  12/01/2012    Procedure: LOWER EXTREMITY ANGIOGRAM;  Surgeon: Lorretta Harp, MD;  Location: [facility] CATH LAB;  Service: Cardiovascular;;  . Percutaneous stent intervention Left 12/01/2012    Procedure: PERCUTANEOUS STENT INTERVENTION;  Surgeon: Lorretta Harp, MD;  Location: [facility] CATH LAB;  Service: Cardiovascular;  Laterality: Left;  Left SFA  . Left heart catheterization with coronary/graft angiogram N/A 09/28/2013    Procedure: LEFT HEART CATHETERIZATION WITH Beatrix Fetters;  Surgeon: Peter M Martinique, MD;  Location: [facility] CATH LAB;   Service: Cardiovascular;  Laterality: N/A;  . Cardiac catheterization  2002  . Coronary angioplasty with stent placement  2004; 2012    "I have 2 stents" (08/04/2013)  . Coronary angioplasty with stent placement  09/28/13    PTCA/ DES Xience stent to VG-Diag   . Cardiac catheterization N/A 12/12/2014    Procedure: Left Heart Cath and Cors/Grafts Angiography;  Surgeon: Peter M Martinique, MD;  Location: [facility] CV LAB;  Service: Cardiovascular;  Laterality: N/A;  . Cardiac catheterization N/A 12/12/2014    Procedure: Coronary Stent Intervention;  Surgeon: Peter M Martinique, MD;  Location: [place] CV LAB;  Service: Cardiovascular;  Laterality: N/A;   Family History  Problem Relation Age of Onset  . Diabetes Mother   . Hypertension Mother   . Stroke Mother   . Hypertension Father   . Hypertension Brother   . Hypertension Sister   . Diabetes Sister   . Hyperlipidemia Sister    Social History  Substance Use Topics  . Smoking status: Former Smoker -- 1.00 packs/day for 25 years    Types: Cigarettes    Quit date: 04/04/2000  . Smokeless tobacco: Never Used  . Alcohol Use: No     Comment: 12/12/2014  "have a glass of red wine on my birthday q yr; that's it"   OB History    No data available     Review of Systems    Allergies  Digoxin and related; Hydralazine; Lisinopril; Penicillins cross reactors; and Adhesive  Home Medications   Prior to Admission medications   Medication Sig Start Date End Date Taking? Authorizing Provider  acetaminophen-codeine (TYLENOL #3) 300-30 MG tablet Take 1 tablet by mouth every 4 (four) hours as needed for moderate pain. 08/24/15  Yes Tresa Garter, MD  albuterol (PROVENTIL HFA;VENTOLIN HFA) 108 (90 Base) MCG/ACT inhaler Inhale 2 puffs into the lungs every 6 (six) hours as needed for wheezing or shortness of breath. 09/28/15  Yes Tresa Garter, MD  allopurinol (ZYLOPRIM) 300 MG tablet Take 1 tablet (300 mg total) by mouth daily. 07/28/15  Yes  Ripudeep Krystal Eaton, MD  ALPRAZolam Duanne Moron) 0.25 MG tablet Take 1 tablet (0.25 mg total) by mouth at bedtime as needed for anxiety. 09/28/15  Yes Tresa Garter, MD  amLODipine (NORVASC) 10 MG tablet Take 1 tablet (10 mg total) by mouth daily. 07/28/15  Yes Ripudeep Krystal Eaton, MD  aspirin 81 MG chewable tablet Chew 1 tablet (81 mg total) by mouth daily. 02/24/15  Yes Mihai Croitoru, MD  atorvastatin (LIPITOR) 40 MG tablet Take 1 tablet (40 mg total) by mouth daily. 07/28/15  Yes Ripudeep Krystal Eaton, MD  cloNIDine (CATAPRES) 0.3 MG tablet Take 1 tablet (0.3 mg total)  by mouth 2 (two) times daily. 07/28/15  Yes Ripudeep Krystal Eaton, MD  clopidogrel (PLAVIX) 75 MG tablet Take 1 tablet (75 mg total) by mouth daily. 09/25/15  Yes Mihai Croitoru, MD  cyclobenzaprine (FLEXERIL) 10 MG tablet Take 1 tablet (10 mg total) by mouth 3 (three) times daily as needed for muscle spasms. 01/27/14  Yes Tresa Garter, MD  ezetimibe (ZETIA) 10 MG tablet Take 1 tablet (10 mg total) by mouth daily. 07/28/15  Yes Ripudeep Krystal Eaton, MD  fluticasone (FLONASE) 50 MCG/ACT nasal spray Place 2 sprays into both nostrils daily as needed for allergies or rhinitis. 09/28/15  Yes Tresa Garter, MD  furosemide (LASIX) 40 MG tablet Take 1 tablet (40 mg total) by mouth daily. 07/28/15  Yes Ripudeep Krystal Eaton, MD  gabapentin (NEURONTIN) 400 MG capsule take 1 capsule by mouth three times a day 02/16/15  Yes Olugbemiga E Doreene Burke, MD  insulin aspart (NOVOLOG FLEXPEN) 100 UNIT/ML FlexPen Inject 10-18 units into the skin 3 times daily, plus sliding scale as instructed. 09/13/15  Yes Philemon Kingdom, MD  Insulin Glargine (TOUJEO SOLOSTAR) 300 UNIT/ML SOPN Inject 40 Units into the skin at bedtime. 09/13/15  Yes Philemon Kingdom, MD  Insulin Pen Needle 31G X 5 MM MISC Use to inject insulin 4 times daily as instructed. 02/16/15  Yes Philemon Kingdom, MD  Insulin Syringes, Disposable, (B-D INSULIN SYRINGE 1CC) U-100 1 ML MISC Use to inject insulin 4 times daily as  instructed. 02/22/14  Yes Philemon Kingdom, MD  isosorbide mononitrate (IMDUR) 120 MG 24 hr tablet Take 1 tablet (120 mg total) by mouth daily. 07/28/15  Yes Ripudeep Krystal Eaton, MD  levothyroxine (SYNTHROID, LEVOTHROID) 25 MCG tablet take 1 tablet by mouth every morning BEFORE BREAKFAST 07/28/15  Yes Ripudeep K Rai, MD  loratadine (CLARITIN) 10 MG tablet Take 1 tablet (10 mg total) by mouth daily as needed for allergies. 12/15/14  Yes Luke K Kilroy, PA-C  magnesium citrate SOLN Take 296 mLs (1 Bottle total) by mouth as needed for severe constipation. 07/28/15  Yes Ripudeep Krystal Eaton, MD  Menthol-Camphor (TIGER BALM ARTHRITIS RUB) 11-11 % CREA Apply 1 application topically 2 (two) times daily.   Yes Historical Provider, MD  metoprolol succinate (TOPROL-XL) 50 MG 24 hr tablet Take 3 tablets (150 mg total) by mouth daily. Take with or immediately following a meal. 07/28/15  Yes Ripudeep K Rai, MD  nitroGLYCERIN (NITROSTAT) 0.4 MG SL tablet Place 1 tablet (0.4 mg total) under the tongue every 5 (five) minutes as needed. For chest 11/10/14  Yes Tresa Garter, MD  traZODone (DESYREL) 50 MG tablet Take 1 tablet (50 mg total) by mouth at bedtime. 09/28/15  Yes Tresa Garter, MD  triamcinolone ointment (KENALOG) 0.5 % Apply 1 application topically 2 (two) times daily. 08/24/15  Yes Tresa Garter, MD  docusate sodium (COLACE) 100 MG capsule Take 1 capsule (100 mg total) by mouth 2 (two) times daily. constipation Patient not taking: Reported on 09/28/2015 07/28/15   Ripudeep Krystal Eaton, MD  ranitidine (ZANTAC 75) 75 MG tablet Take 1 tablet (75 mg total) by mouth 2 (two) times daily. Patient not taking: Reported on 09/28/2015 07/28/15   Ripudeep K Rai, MD   BP 151/126 mmHg  Pulse 72  Temp(Src) 98.3 F (36.8 C) (Oral)  Resp 16  SpO2 99%  LMP 10/13/2014 (Exact Date) Physical Exam  Constitutional: She is oriented to person, place, and time. She appears well-developed.  HENT:  Head: Normocephalic and atraumatic.  Eyes: Conjunctivae and EOM are normal. Pupils are equal, round, and reactive to light.  Neck: Normal range of motion. Neck supple. No JVD present. No thyromegaly present.  Cardiovascular: Normal rate, regular rhythm and normal heart sounds.   Pulmonary/Chest: Effort normal and breath sounds normal. No respiratory distress.  Abdominal: Soft. Bowel sounds are normal. She exhibits no distension. There is no tenderness. There is no rebound and no guarding.  Musculoskeletal: She exhibits no edema or tenderness.  Lymphadenopathy:    She has no cervical adenopathy.  Neurological: She is alert and oriented to person, place, and time. No cranial nerve deficit. Coordination normal.  Cerebellar exam is normal (finger to nose) Sensory exam normal for bilateral upper and lower extremities - and patient is able to discriminate between sharp and dull. Motor exam is 4+/5  Upon ambulation - pt is slightly dizzy  Skin: Skin is warm and dry.  Nursing note and vitals reviewed.   ED Course  Procedures (including critical care time) Labs Review Labs Reviewed  BASIC METABOLIC PANEL - Abnormal; Notable for the following:    Glucose, Bld 190 (*)    BUN 23 (*)    Creatinine, Ser 2.46 (*)    GFR calc non Af Amer 20 (*)    GFR calc Af Amer 23 (*)    All other components within normal limits  CBC - Abnormal; Notable for the following:    RBC 3.50 (*)    Hemoglobin 10.5 (*)    HCT 32.2 (*)    All other components within normal limits  TSH    Imaging Review Ct Head Wo Contrast  10/22/2015  CLINICAL DATA:  Dizziness EXAM: CT HEAD WITHOUT CONTRAST TECHNIQUE: Contiguous axial images were obtained from the base of the skull through the vertex without intravenous contrast. COMPARISON:  Sep 03, 2009 FINDINGS: Brain: The ventricles are normal in size and configuration. There is no intracranial mass, hemorrhage, extra-axial fluid collection, or midline shift. There is evidence of a prior small lacunar type infarct  in the posterior mid left cerebellar hemisphere. There is evidence of a prior tiny lacunar infarct in the posterior limb of the right internal capsule. There is rather minimal periventricular small vessel disease in the centra semiovale bilaterally. No acute infarct evident. Vascular: No hyperdense vessels noted. Skull: The bony calvarium appears intact. Sinuses/Orbits: Visualized paranasal sinuses are clear. Orbits appear symmetric bilaterally. Other: Visualized mastoid air cells are clear. IMPRESSION: Mild small vessel disease in the periventricular white matter. Prior small lacunar infarcts in the posterior limb of the right internal capsule and posterior mid left cerebellar hemisphere. No acute infarct evident. No hemorrhage or mass effect. Electronically Signed   By: Lowella Grip III M.D.   On: 10/22/2015 14:30   I have personally reviewed and evaluated these images and lab results as part of my medical decision-making.   EKG Interpretation None      MDM   Final diagnoses:  Dizziness  Neck pain    Pt comes in with cc of dizziness, neck pain She has extensive medical hx, including CAD, PVD, DM, CKD. Denies stroke hx. Pt is having dizziness x 1 week, fairly constant. Neuro exam is non focal. Pt was ambulated, and appeared slightly unsteady.  DDx includes:  Stroke - ischemic vs. hemorrhagic TIA Myelitis Electrolyte abnormality Neuropathy  Pt is having neck pain. Pain radiating down to the shoulder bilaterally. No meningismus. Clinical concerns for dissection is low.  Plan is to get CT head and reassess. Pt  has already refused MRI - reports anxiety. Discussed with her that we can give her meds to help out - but she still refused. CT head ordered. Will reassess.  @4 :00 Pt's CT does show some posterior strokes- old. We offered MRI again - but she refused. Prefers open MRI - which we informed her we didn't have the ability to order.  Patient wants to leave against medical  advice. Patient understands that his/her actions will lead to inadequate medical workup, and that he/she is at risk of complications of missed diagnosis, which includes morbidity and mortality.  Patient is demonstrating good capacity to make decision. Patient understands that he/she needs to return to the ER immediately if his/her symptoms get worse.  She will see her PCP or Neuro for the MRI and further workup.      Varney Biles, MD 10/22/15 [phone]

## 2015-10-22 NOTE — ED Notes (Signed)
Patient here with neck stiffness and swelling x 5 days. She thinks related to her thyroid. States that her neck hurts so much and the death of her son is causing panic attacks. Alert and oriented, no distress

## 2015-10-22 NOTE — ED Notes (Signed)
Pt standing in hallway and states nothing has changed so she is going home. Same reported Dr. Kathrynn Humble.

## 2015-10-22 NOTE — Discharge Instructions (Signed)
We saw you in the ER for the dizziness. All the results in the ER are normal, labs and imaging. We are not sure what is causing your symptoms. We think you need to see a Neurologist for further evaluation, as stroke is high on our list. You have preferred to get outpatient workup done rather than get admitted. The workup in the ER is not complete, and is limited to screening for life threatening and emergent conditions only, so please see a primary care doctor for further evaluation.  Please return to the ER if there is any new numbness, tingling, focal weakness.   Dizziness Dizziness is a common problem. It is a feeling of unsteadiness or light-headedness. You may feel like you are about to faint. Dizziness can lead to injury if you stumble or fall. Anyone can become dizzy, but dizziness is more common in older adults. This condition can be caused by a number of things, including medicines, dehydration, or illness. HOME CARE INSTRUCTIONS Taking these steps may help with your condition: Eating and Drinking  Drink enough fluid to keep your urine clear or pale yellow. This helps to keep you from becoming dehydrated. Try to drink more clear fluids, such as water.  Do not drink alcohol.  Limit your caffeine intake if directed by your health care provider.  Limit your salt intake if directed by your health care provider. Activity  Avoid making quick movements.  Rise slowly from chairs and steady yourself until you feel okay.  In the morning, first sit up on the side of the bed. When you feel okay, stand slowly while you hold onto something until you know that your balance is fine.  Move your legs often if you need to stand in one place for a long time. Tighten and relax your muscles in your legs while you are standing.  Do not drive or operate heavy machinery if you feel dizzy.  Avoid bending down if you feel dizzy. Place items in your home so that they are easy for you to reach without  leaning over. Lifestyle  Do not use any tobacco products, including cigarettes, chewing tobacco, or electronic cigarettes. If you need help quitting, ask your health care provider.  Try to reduce your stress level, such as with yoga or meditation. Talk with your health care provider if you need help. General Instructions  Watch your dizziness for any changes.  Take medicines only as directed by your health care provider. Talk with your health care provider if you think that your dizziness is caused by a medicine that you are taking.  Tell a friend or a family member that you are feeling dizzy. If he or she notices any changes in your behavior, have this person call your health care provider.  Keep all follow-up visits as directed by your health care provider. This is important. SEEK MEDICAL CARE IF:  Your dizziness does not go away.  Your dizziness or light-headedness gets worse.  You feel nauseous.  You have reduced hearing.  You have new symptoms.  You are unsteady on your feet or you feel like the room is spinning. SEEK IMMEDIATE MEDICAL CARE IF:  You vomit or have diarrhea and are unable to eat or drink anything.  You have problems talking, walking, swallowing, or using your arms, hands, or legs.  You feel generally weak.  You are not thinking clearly or you have trouble forming sentences. It may take a friend or family member to notice this.  You  have chest pain, abdominal pain, shortness of breath, or sweating.  Your vision changes.  You notice any bleeding.  You have a headache.  You have neck pain or a stiff neck.  You have a fever.   This information is not intended to replace advice given to you by your health care provider. Make sure you discuss any questions you have with your health care provider.   Document Released: 09/25/2000 Document Revised: 08/16/2014 Document Reviewed: 03/28/2014 Elsevier Interactive Patient Education [employer].

## 2015-10-22 NOTE — ED Notes (Signed)
Patient transported to CT 

## 2015-10-22 NOTE — ED Notes (Signed)
Pt states dizzy x 1 week.

## 2015-10-23 ENCOUNTER — Encounter: Payer: Self-pay | Admitting: Neurology

## 2015-10-23 ENCOUNTER — Ambulatory Visit (INDEPENDENT_AMBULATORY_CARE_PROVIDER_SITE_OTHER): Payer: Medicare Other | Admitting: Neurology

## 2015-10-23 VITALS — BP 156/72 | HR 78 | Ht 68.5 in | Wt 251.8 lb

## 2015-10-23 DIAGNOSIS — Z951 Presence of aortocoronary bypass graft: Secondary | ICD-10-CM | POA: Diagnosis not present

## 2015-10-23 DIAGNOSIS — R269 Unspecified abnormalities of gait and mobility: Secondary | ICD-10-CM | POA: Diagnosis not present

## 2015-10-23 DIAGNOSIS — N183 Chronic kidney disease, stage 3 unspecified: Secondary | ICD-10-CM

## 2015-10-23 DIAGNOSIS — E1122 Type 2 diabetes mellitus with diabetic chronic kidney disease: Secondary | ICD-10-CM | POA: Diagnosis not present

## 2015-10-23 DIAGNOSIS — E1140 Type 2 diabetes mellitus with diabetic neuropathy, unspecified: Secondary | ICD-10-CM

## 2015-10-23 DIAGNOSIS — E785 Hyperlipidemia, unspecified: Secondary | ICD-10-CM | POA: Diagnosis not present

## 2015-10-23 DIAGNOSIS — F329 Major depressive disorder, single episode, unspecified: Secondary | ICD-10-CM

## 2015-10-23 DIAGNOSIS — Z794 Long term (current) use of insulin: Secondary | ICD-10-CM

## 2015-10-23 DIAGNOSIS — F32A Depression, unspecified: Secondary | ICD-10-CM
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
## 2015-10-25 ENCOUNTER — Ambulatory Visit (INDEPENDENT_AMBULATORY_CARE_PROVIDER_SITE_OTHER): Payer: Medicare Other

## 2015-10-25 DIAGNOSIS — E1140 Type 2 diabetes mellitus with diabetic neuropathy, unspecified: Secondary | ICD-10-CM

## 2015-10-25 DIAGNOSIS — F329 Major depressive disorder, single episode, unspecified: Secondary | ICD-10-CM

## 2015-10-25 DIAGNOSIS — N183 Chronic kidney disease, stage 3 (moderate): Secondary | ICD-10-CM

## 2015-10-25 DIAGNOSIS — F32A Depression, unspecified: Secondary | ICD-10-CM

## 2015-10-25 DIAGNOSIS — E785 Hyperlipidemia, unspecified: Secondary | ICD-10-CM

## 2015-10-25 DIAGNOSIS — Z951 Presence of aortocoronary bypass graft: Secondary | ICD-10-CM | POA: Diagnosis not present

## 2015-10-25 DIAGNOSIS — E1122 Type 2 diabetes mellitus with diabetic chronic kidney disease: Secondary | ICD-10-CM

## 2015-10-25 DIAGNOSIS — Z794 Long term (current) use of insulin: Secondary | ICD-10-CM

## 2015-10-25 DIAGNOSIS — R269 Unspecified abnormalities of gait and mobility: Secondary | ICD-10-CM

## 2015-11-01 ENCOUNTER — Ambulatory Visit: Payer: Medicare Other | Admitting: Nurse Practitioner

## 2015-11-02 ENCOUNTER — Encounter: Payer: Self-pay | Admitting: Internal Medicine

## 2015-11-02 ENCOUNTER — Ambulatory Visit: Payer: Medicare Other | Attending: Internal Medicine | Admitting: Internal Medicine

## 2015-11-02 ENCOUNTER — Other Ambulatory Visit: Payer: Self-pay

## 2015-11-02 VITALS — BP 159/72 | HR 60 | Temp 98.2°F | Resp 18 | Ht 70.0 in | Wt 255.2 lb

## 2015-11-02 DIAGNOSIS — I639 Cerebral infarction, unspecified: Secondary | ICD-10-CM | POA: Diagnosis not present

## 2015-11-02 DIAGNOSIS — N183 Chronic kidney disease, stage 3 (moderate): Secondary | ICD-10-CM

## 2015-11-02 DIAGNOSIS — I200 Unstable angina: Secondary | ICD-10-CM | POA: Diagnosis not present

## 2015-11-02 DIAGNOSIS — I4581 Long QT syndrome: Secondary | ICD-10-CM | POA: Diagnosis not present

## 2015-11-02 DIAGNOSIS — I6350 Cerebral infarction due to unspecified occlusion or stenosis of unspecified cerebral artery: Secondary | ICD-10-CM

## 2015-11-02 DIAGNOSIS — F4322 Adjustment disorder with anxiety: Secondary | ICD-10-CM

## 2015-11-02 DIAGNOSIS — R600 Localized edema: Secondary | ICD-10-CM | POA: Diagnosis not present

## 2015-11-02 DIAGNOSIS — E1122 Type 2 diabetes mellitus with diabetic chronic kidney disease: Secondary | ICD-10-CM | POA: Diagnosis not present

## 2015-11-02 DIAGNOSIS — I10 Essential (primary) hypertension: Secondary | ICD-10-CM | POA: Diagnosis not present

## 2015-11-02 DIAGNOSIS — F419 Anxiety disorder, unspecified: Secondary | ICD-10-CM | POA: Insufficient documentation

## 2015-11-02 DIAGNOSIS — Z79899 Other long term (current) drug therapy: Secondary | ICD-10-CM | POA: Insufficient documentation

## 2015-11-02 DIAGNOSIS — Z794 Long term (current) use of insulin: Secondary | ICD-10-CM | POA: Insufficient documentation

## 2015-11-02 LAB — GLUCOSE, POCT (MANUAL RESULT ENTRY): POC Glucose: 127 mg/dl — AB (ref 70–99)

## 2015-11-02 LAB — POCT GLYCOSYLATED HEMOGLOBIN (HGB A1C): Hemoglobin A1C: 6.2

## 2015-11-02 MED ORDER — ALPRAZOLAM 0.25 MG PO TABS
0.2500 mg | ORAL_TABLET | Freq: Every evening | ORAL | Status: DC | PRN
Start: 2015-11-02 — End: 2015-11-30

## 2015-11-02 MED ORDER — CLONIDINE HCL 0.3 MG PO TABS: 0.3000 mg | ORAL_TABLET | Freq: Two times a day (BID) | ORAL | Status: DC

## 2015-11-02 NOTE — Progress Notes (Signed)
Patient ID: Brooke Stafford, female   DOB: 1955/06/24, 60 y.o.   MRN: [id number]   Brooke Stafford, is a 60 y.o. female  [id number]  [id number]  DOB - Dec 19, 1955  Chief Complaint  Patient presents with  . Stroke Symptoms        Subjective:   Brooke Stafford is a 60 y.o. female multiple medical history including advanced coronary artery disease with previous bypass surgery and stent placement, chronic systolic and diastolic heart failure with recent acute exacerbation, insulin requiring type 2 diabetes mellitus complicated by chronic kidney disease stage III-IV, peripheral arterial disease, malignant hypertension, and hyperlipidemia here today for ED follow up visit. She was recently seen in the ED for c/o dizziness and light headedness. She lives with her friend, she presented to the emergency room by taxi complaining of dizziness on October 22 2015, she noticed sudden onset of dizziness when she woke up from overnight sleep, felt unsteady gait, double vision, difficulty reading, she denies slurred speech, no sensory loss, no weakness, she still complains of dizziness, but unsteady gait has slightly improved over the past few days. She also complains of 2 months history of neck pain since June 2017.  CT head in the ED showed "Mild small vessel disease in the periventricular white matter. Prior small lacunar infarcts in the posterior limb of the right internal capsule and posterior mid left cerebellar hemisphere. No acute infarct evident. No hemorrhage or mass effect". She followed up with Neurologist and MRI has been ordered to rule out posterior circulation CVA. She also had carotid artery ultrasound done, awaiting result. She is on aspirin and plavix. She said she is feeling better about her son's sudden death although she's still depressed. She denied any suicidal thoughts. She denies radiating pain to bilateral shoulder and upper extremity. Today, patient has no headache, no chest pain, no  abdominal pain.  Problem  Adjustment Disorder With Anxious Mood  Posterior Circulation Stroke (Hcc)    ALLERGIES: Allergies  Allergen Reactions  . Digoxin And Related Other (See Comments)    Patient stated she almost died. Had flu like symptoms as well as diarrhea.  . Hydralazine Shortness Of Breath  . Lisinopril Other (See Comments)    Felt like she had the flu. Was very sick!!!  . Penicillins Cross Reactors Hives    And high fever  . Adhesive [Tape] Rash    bruising    PAST MEDICAL HISTORY: Past Medical History  Diagnosis Date  . Asthma   . Anxiety   . GERD (gastroesophageal reflux disease)   . Coronary artery disease 2002    CABG x 6. Cath 5/11- med Rx  . Hypertension   . Peripheral vascular disease ([place]) 12/12    LSFA PTA  . Anemia   . Hyperlipidemia   . Chronic renal insufficiency, stage II (mild)     followed  by [facility]  . CAD (coronary artery disease) 2002; 2015    CABG x 6 2002, cath 2011- med Rx stent DES VG-Diag  . Hypothyroid     treated  . Obesity (BMI 35.0-39.9 without comorbidity) ([place])   . CHF (congestive heart failure) ([place])     "in 2002" (11/26/2012)  . Chronic bronchitis ([place])     "q year; in the winter"   . Type II diabetes mellitus ([place])   . History of blood transfusion 2002    "when I had OHS"  . Gout     "right big toe"  . Anginal pain ([place])   .  CAD (coronary artery disease) of artery bypass graft; DES to VG-Diag 09/28/13 11/09/2013  . Myocardial infarction ([place]) 2000; 2002; 2011  . Pneumonia     "3 times I think" (12/12/2014)  . Migraines     "couple times/year" (08/04/2013), (12/12/2014)  . Headache     "~ q week" (08/04/2013); "~ twice/month" (12/12/2014)  . Arthritis     "stiff fingers and knees" (08/04/2013), (12/12/2014)  . Cataract     MEDICATIONS AT HOME: Prior to Admission medications   Medication Sig Start Date End Date Taking? Authorizing Provider  acetaminophen-codeine (TYLENOL #3) 300-30 MG tablet Take 1 tablet by  mouth every 4 (four) hours as needed for moderate pain. 08/24/15  Yes Tresa Garter, MD  albuterol (PROVENTIL HFA;VENTOLIN HFA) 108 (90 Base) MCG/ACT inhaler Inhale 2 puffs into the lungs every 6 (six) hours as needed for wheezing or shortness of breath. 09/28/15  Yes Tresa Garter, MD  allopurinol (ZYLOPRIM) 300 MG tablet Take 1 tablet (300 mg total) by mouth daily. 07/28/15  Yes Ripudeep Krystal Eaton, MD  ALPRAZolam Duanne Moron) 0.25 MG tablet Take 1 tablet (0.25 mg total) by mouth at bedtime as needed for anxiety. 11/02/15  Yes Tresa Garter, MD  amLODipine (NORVASC) 10 MG tablet Take 1 tablet (10 mg total) by mouth daily. 07/28/15  Yes Ripudeep Krystal Eaton, MD  aspirin 81 MG chewable tablet Chew 1 tablet (81 mg total) by mouth daily. 02/24/15  Yes Mihai Croitoru, MD  atorvastatin (LIPITOR) 40 MG tablet Take 1 tablet (40 mg total) by mouth daily. 07/28/15  Yes Ripudeep Krystal Eaton, MD  cloNIDine (CATAPRES) 0.3 MG tablet Take 1 tablet (0.3 mg total) by mouth 2 (two) times daily. 07/28/15  Yes Ripudeep Krystal Eaton, MD  clopidogrel (PLAVIX) 75 MG tablet Take 1 tablet (75 mg total) by mouth daily. 09/25/15  Yes Mihai Croitoru, MD  ezetimibe (ZETIA) 10 MG tablet Take 1 tablet (10 mg total) by mouth daily. 07/28/15  Yes Ripudeep Krystal Eaton, MD  fluticasone (FLONASE) 50 MCG/ACT nasal spray Place 2 sprays into both nostrils daily as needed for allergies or rhinitis. 09/28/15  Yes Tresa Garter, MD  furosemide (LASIX) 40 MG tablet Take 1 tablet (40 mg total) by mouth daily. 07/28/15  Yes Ripudeep Krystal Eaton, MD  gabapentin (NEURONTIN) 400 MG capsule take 1 capsule by mouth three times a day 02/16/15  Yes Shaterria Sager E Doreene Burke, MD  insulin aspart (NOVOLOG FLEXPEN) 100 UNIT/ML FlexPen Inject 10-18 units into the skin 3 times daily, plus sliding scale as instructed. 09/13/15  Yes Philemon Kingdom, MD  Insulin Glargine (TOUJEO SOLOSTAR) 300 UNIT/ML SOPN Inject 40 Units into the skin at bedtime. 09/13/15  Yes Philemon Kingdom, MD  Insulin Pen  Needle 31G X 5 MM MISC Use to inject insulin 4 times daily as instructed. 02/16/15  Yes Philemon Kingdom, MD  Insulin Syringes, Disposable, (B-D INSULIN SYRINGE 1CC) U-100 1 ML MISC Use to inject insulin 4 times daily as instructed. 02/22/14  Yes Philemon Kingdom, MD  isosorbide mononitrate (IMDUR) 120 MG 24 hr tablet Take 1 tablet (120 mg total) by mouth daily. 07/28/15  Yes Ripudeep Krystal Eaton, MD  levothyroxine (SYNTHROID, LEVOTHROID) 25 MCG tablet take 1 tablet by mouth every morning BEFORE BREAKFAST 07/28/15  Yes Ripudeep K Rai, MD  loratadine (CLARITIN) 10 MG tablet Take 1 tablet (10 mg total) by mouth daily as needed for allergies. 12/15/14  Yes Luke K Kilroy, PA-C  magnesium citrate SOLN Take 296 mLs (1 Bottle total) by  mouth as needed for severe constipation. 07/28/15  Yes Ripudeep Krystal Eaton, MD  Menthol-Camphor (TIGER BALM ARTHRITIS RUB) 11-11 % CREA Apply 1 application topically 2 (two) times daily.   Yes Historical Provider, MD  metoprolol succinate (TOPROL-XL) 50 MG 24 hr tablet Take 3 tablets (150 mg total) by mouth daily. Take with or immediately following a meal. 07/28/15  Yes Ripudeep K Rai, MD  nitroGLYCERIN (NITROSTAT) 0.4 MG SL tablet Place 1 tablet (0.4 mg total) under the tongue every 5 (five) minutes as needed. For chest 11/10/14  Yes Paislea Hatton E Doreene Burke, MD  ranitidine (ZANTAC 75) 75 MG tablet Take 1 tablet (75 mg total) by mouth 2 (two) times daily. 07/28/15  Yes Ripudeep Krystal Eaton, MD  traZODone (DESYREL) 50 MG tablet Take 1 tablet (50 mg total) by mouth at bedtime. 09/28/15  Yes Tresa Garter, MD  triamcinolone ointment (KENALOG) 0.5 % Apply 1 application topically 2 (two) times daily. 08/24/15  Yes Tresa Garter, MD     Objective:   Filed Vitals:   11/02/15 1627  BP: 159/72  Pulse: 60  Temp: 98.2 F (36.8 C)  TempSrc: Oral  Resp: 18  Height: 5\' 10"  (1.778 m)  Weight: 255 lb 3.2 oz (115.758 kg)  SpO2: 96%    Exam General appearance : Awake, alert, not in any distress.  Speech Clear. Not toxic looking, walking with a cane. HEENT: Atraumatic and Normocephalic, pupils equally reactive to light and accomodation Neck: Supple, no JVD. No cervical lymphadenopathy.  Chest: Midline healed surgical scar. Good air entry bilaterally, no added sounds  CVS: S1 S2 regular, no murmurs.  Abdomen: Bowel sounds present, Non tender and not distended with no gaurding, rigidity or rebound. Extremities: B/L Lower Ext shows no edema, both legs are warm to touch Neurology: Awake alert, and oriented X 3, CN II-XII intact, Non focal Skin: No Rash  Data Review Lab Results  Component Value Date   HGBA1C 6.2 11/02/2015   HGBA1C 7.2* 07/25/2015   HGBA1C 6.40 02/23/2015     Assessment & Plan   1. Type 2 diabetes mellitus with stage 3 chronic kidney disease, with long-term current use of insulin (HCC)  - Glucose (CBG) - POCT A1C  2. Adjustment disorder with anxious mood  - ALPRAZolam (XANAX) 0.25 MG tablet; Take 1 tablet (0.25 mg total) by mouth at bedtime as needed for anxiety.  Dispense: 30 tablet; Refill: 0  3. Essential hypertension, benign  We have discussed target BP range and blood pressure goal. I have advised patient to check BP regularly and to call us back or report to clinic if the numbers are consistently higher than 140/90. We discussed the importance of compliance with medical therapy and DASH diet recommended, consequences of uncontrolled hypertension discussed.  - continue current BP medications  4. Posterior circulation stroke ([employer])  - Physical Therapy - Follow up with neurologist - Continue aspirin and plavix  5. Edema of right lower extremity  - Take Furosemide 40 mg tab po bid for 3 days from today then 40 mg daily - Restrict fluid intake as previously discussed   6. Prolonged QT on EKG done today, chronic, also on EKG in April  - Stop taking Trazodone in view of prolonged QT - Monitor closely  Patient have been counseled extensively about  nutrition and exercise  Return in about 4 weeks (around 11/30/2015) for Dizziness, Generalized Anxiety Disorder.  The patient was given clear instructions to go to ER or return to medical center if  symptoms don't improve, worsen or new problems develop. The patient verbalized understanding. The patient was told to call to get lab results if they haven't heard anything in the next week.   This note has been created with Surveyor, quantity. Any transcriptional errors are unintentional.    Brooke Chessman, MD, [place], [place], [place], [place] and [place], [place]   11/02/2015, 4:51 PM

## 2015-11-02 NOTE — Progress Notes (Signed)
Patient is here for FU strokes  Patient complains of bilateral shoulder pain being present.  Patient has taken medication and patient has eaten today.  Patient complains of dizziness with the heat and SOB.  Patient complains of leg and facial edema.

## 2015-11-02 NOTE — Patient Instructions (Addendum)
Generalized Anxiety Disorder Generalized anxiety disorder (GAD) is a mental disorder. It interferes with life functions, including relationships, work, and school. GAD is different from normal anxiety, which everyone experiences at some point in their lives in response to specific life events and activities. Normal anxiety actually helps [place] prepare for and get through these life events and activities. Normal anxiety goes away after the event or activity is over.  GAD causes anxiety that is not necessarily related to specific events or activities. It also causes excess anxiety in proportion to specific events or activities. The anxiety associated with GAD is also difficult to control. GAD can vary from mild to severe. People with severe GAD can have intense waves of anxiety with physical symptoms (panic attacks).  SYMPTOMS The anxiety and worry associated with GAD are difficult to control. This anxiety and worry are related to many life events and activities and also occur more days than not for 6 months or longer. People with GAD also have three or more of the following symptoms (one or more in children):  Restlessness.   Fatigue.  Difficulty concentrating.   Irritability.  Muscle tension.  Difficulty sleeping or unsatisfying sleep. DIAGNOSIS GAD is diagnosed through an assessment by your health care provider. Your health care provider will ask you questions aboutyour mood,physical symptoms, and events in your life. Your health care provider may ask you about your medical history and use of alcohol or drugs, including prescription medicines. Your health care provider may also do a physical exam and blood tests. Certain medical conditions and the use of certain substances can cause symptoms similar to those associated with GAD. Your health care provider may refer you to a mental health specialist for further evaluation. TREATMENT The following therapies are usually used to treat GAD:    Medication. Antidepressant medication usually is prescribed for long-term daily control. Antianxiety medicines may be added in severe cases, especially when panic attacks occur.   Talk therapy (psychotherapy). Certain types of talk therapy can be helpful in treating GAD by providing support, education, and guidance. A form of talk therapy called cognitive behavioral therapy can teach you healthy ways to think about and react to daily life events and activities.  Stress managementtechniques. These include yoga, meditation, and exercise and can be very helpful when they are practiced regularly. A mental health specialist can help determine which treatment is best for you. Some people see improvement with one therapy. However, other people require a combination of therapies.   This information is not intended to replace advice given to you by your health care provider. Make sure you discuss any questions you have with your health care provider.   Document Released: 07/27/2012 Document Revised: 04/22/2014 Document Reviewed: 07/27/2012 Elsevier Interactive Patient Education 2016 [employer]. Ischemic Stroke Treated Without Warfarin An ischemic stroke (cerebrovascular accident) is the sudden death of brain tissue. It is a medical emergency. An ischemic stroke can cause permanent loss of brain function. This can cause problems with different parts of your body. CAUSES An ischemic stroke is caused by a decrease of oxygen supply to an area of your brain. It is usually the result of a small blood clot (embolus) or collection of cholesterol or fat (plaque) that blocks blood flow in the brain. An ischemic stroke can also be caused by blocked or damaged carotid arteries. RISK FACTORS  High blood pressure (hypertension).  High cholesterol.  Diabetes mellitus.  Heart disease.  The buildup of plaque in the blood vessels (peripheral artery  disease or atherosclerosis).  The buildup of plaque in the  blood vessels that provide blood and oxygen to the brain (carotid artery stenosis).  An abnormal heart rhythm (atrial fibrillation).  Obesity.  Smoking cigarettes.  Taking oral contraceptives, especially in combination with using tobacco.  Physical inactivity.  A diet that is high in fats, salt (sodium), and calories.  Excessive alcohol use.  Use of illegal drugs, especially cocaine and methamphetamine.  Being African American.  Being over the age of 90 years.  Family history of stroke.  Previous history of blood clots, stroke, TIA (transient ischemic attack), or heart attack.  Sickle cell disease. SIGNS AND SYMPTOMS These symptoms usually develop suddenly, or you may notice them after waking up from sleep. Symptoms may include sudden:  Weakness or numbness in your face, arm, or leg, especially on one side of your body.  Confusion.  Trouble speaking (aphasia) or understanding speech.  Trouble seeing with one or both eyes.  Trouble walking or difficulty moving your arms or legs.  Dizziness.  Loss of balance or coordination.  Severe headache with no known cause. The headache is often described as the worst headache ever experienced. DIAGNOSIS Your health care provider can often determine the presence or absence of an ischemic stroke based on your symptoms, history, and physical exam. CT (computed tomography) of the brain is usually performed to confirm the stroke, determine causes, and determine stroke severity. Other tests may be done to find the cause of the stroke. These tests may include:  ECG (electrocardiogram).  Continuous heart monitoring.  Echocardiogram.  Carotid ultrasound.  MRI.  A scan of the brain circulation.  Blood tests. TREATMENT It is very important to seek treatment at the first sign of stroke symptoms. Your health care provider may perform the following treatments within 6 hours of the onset of stroke symptoms:  Medicine to dissolve  the blood clot (thrombolytic).  Inserting a device into the affected artery to remove the blood clot. These treatments may not be effective if too much time has passed since your stroke symptoms began. Even if you do not know when your symptoms began, get treatment as soon as possible. There are other treatment options that may be given, such as:  Oxygen.  IV fluids.  Medicines to thin the blood (anticoagulants).  A procedure to widen blocked arteries. Your treatment will depend on how long you have had your symptoms, the severity of your symptoms, and the cause of your symptoms. Your health care provider will take measures to prevent short-term and long-term complications of stroke, such as:  Breathing foreign material into the lungs (aspiration pneumonia).  Blood clots in the legs.  Bedsores.  Falls. Medicines and dietary changes may be used to help treat and manage risk factors for stroke, such as diabetes and high blood pressure. If any of your body's functions were impaired by stroke, you may work with physical, speech, or occupational therapists to help you recover. HOME CARE INSTRUCTIONS  Take medicines only as directed by your health care provider. Follow the directions carefully. Medicines may be used to control risk factors for a stroke. Be sure that you understand all your medicine instructions.  If swallow studies have determined that your swallowing reflex is present, you should eat healthy foods. Foods may need to be a soft or pureed consistency, or you may need to take small bites in order to avoid aspirating or choking.  Follow physical activity guidelines as directed by your health care team.  Do not use any tobacco products, including cigarettes, chewing tobacco, or electronic cigarettes. If you smoke, quit. If you need help quitting, ask your health care provider.  Limit or stop alcohol use.  A safe home environment is important to reduce the risk of falls. Your  health care provider may arrange for specialists to evaluate your home. Having grab bars in the bedroom and bathroom is often important. Your health care provider may arrange for equipment to be used at home, such as raised toilets and a seat for the shower.  Ongoing physical, occupational, and speech therapy may be needed to maximize your recovery after a stroke. If you have been advised to use a walker or a cane, use it at all times. Be sure to keep your therapy appointments.  Keep all follow-up visits with your health care provider. This is very important. This includes any referrals, therapy, rehabilitation, and lab tests. Proper follow-up can prevent another stroke from occurring. PREVENTION The risk of a stroke can be decreased by appropriately treating high blood pressure, high cholesterol, diabetes, heart disease, and obesity. It can also be decreased by quitting smoking, limiting alcohol, and staying physically active. SEEK IMMEDIATE MEDICAL CARE IF:  You have sudden weakness or numbness in your face, arm, or leg, especially on one side of your body.  You have sudden confusion.  You have sudden trouble speaking (aphasia) or understanding.  You have sudden trouble seeing with one or both eyes.  You have sudden trouble walking or difficulty moving your arms or legs.  You have sudden dizziness.  You have a sudden loss of balance or coordination.  You have a sudden, severe headache with no known cause.  You have a partial or total loss of consciousness. Any of these symptoms may represent a serious problem that is an emergency. Do not wait to see if the symptoms will go away. Get medical help right away. Call your local emergency services (911 in [place] Do not drive yourself to the hospital.   This information is not intended to replace advice given to you by your health care provider. Make sure you discuss any questions you have with your health care provider.   Document Released:  01/14/2014 Document Reviewed: 01/14/2014 Elsevier Interactive Patient Education 2016 [employer]vier Inc[employer] Heart Disease Prevention Heart disease is a leading cause of death. There are many things you can do to help prevent heart disease. BE PHYSICALLY ACTIVE Physical activity is good for your heart. It helps control your blood pressure, cholesterol levels, and weight. Try to be physically active every day. Ask your health care provider what activities are best for you.  BE A HEALTHY WEIGHT Extra weight can strain your heart and affect your blood pressure and cholesterol levels. Lose weight with diet and exercise if recommended by your health care provider. EAT HEART-HEALTHY FOODS Follow a healthy eating plan as recommended by your health care provider or dietitian. Heart-healthy foods include:   High-fiber foods. These include oat bran, oatmeal, and whole-grain breads and cereals.  Fruits and vegetables. Avoid:  Alcohol.  Fried foods.  Foods high in saturated fat. These include meats, butter, whole dairy products, shortening, and coconut or palm oil.  Salty foods. These include canned food, luncheon meat, salty snacks, and fast food. KEEP YOUR CHOLESTEROL LEVELS UNDER CONTROL Cholesterol is a substance that is used for many important functions. When your cholesterol levels are high, cholesterol can stick to the insides of your blood vessels, making them narrow or clog. This  can lead to chest pain (angina) and a heart attack.  Keep your cholesterol levels under control as recommended by your health care provider. Have your cholesterol checked at least once a year. Target cholesterol levels (in mg/dL) for most people are:   Total cholesterol below 200.  LDL cholesterol below 100.  HDL cholesterol above 40 in men and above 50 in women.  Triglycerides below 150. KEEP YOUR BLOOD PRESSURE UNDER CONTROL Having high blood pressure (hypertension) puts you at risk for stroke and other forms of  heart disease. Keep your blood pressure under control as recommended by your health care provider. Ask your health care provider if you need treatment to lower your blood pressure. If you are 21-55 years of age, have your blood pressure checked every 3-5 years. If you are 73 years of age or older, have your blood pressure checked every year. DO NOT USE TOBACCO PRODUCTS Tobacco smoke can damage your heart and blood vessels. Do not use any tobacco products including cigarettes, chewing tobacco, or electronic cigarettes. If you need help quitting, ask your health care provider. TAKE MEDICINES AS DIRECTED Take medicines only as directed by your health care provider. Ask your health care provider whether you should take an aspirin every day. Taking aspirin can help reduce your risk of heart disease and stroke.  FOR MORE INFORMATION  To find out more about heart disease, visit the American Heart Association's website at www.americanheart.org   This information is not intended to replace advice given to you by your health care provider. Make sure you discuss any questions you have with your health care provider.   Document Released: 11/14/2003 Document Revised: 04/22/2014 Document Reviewed: 05/26/2013 Elsevier Interactive Patient Education 2016 [place] DASH stands for "Dietary Approaches to Stop Hypertension." The DASH eating plan is a healthy eating plan that has been shown to reduce high blood pressure (hypertension). Additional health benefits may include reducing the risk of type 2 diabetes mellitus, heart disease, and stroke. The DASH eating plan may also help with weight loss. WHAT DO I NEED TO KNOW ABOUT THE DASH EATING PLAN? For the DASH eating plan, you will follow these general guidelines:  Choose foods with a percent daily value for sodium of less than 5% (as listed on the food label).  Use salt-free seasonings or herbs instead of table salt or sea salt.  Check with  your health care provider or pharmacist before using salt substitutes.  Eat lower-sodium products, often labeled as "lower sodium" or "no salt added."  Eat fresh foods.  Eat more vegetables, fruits, and low-fat dairy products.  Choose whole grains. Look for the word "whole" as the first word in the ingredient list.  Choose fish and skinless chicken or [place] more often than red meat. Limit fish, poultry, and meat to 6 oz (170 g) each day.  Limit sweets, desserts, sugars, and sugary drinks.  Choose heart-healthy fats.  Limit cheese to 1 oz (28 g) per day.  Eat more home-cooked food and less restaurant, buffet, and fast food.  Limit fried foods.  Cook foods using methods other than frying.  Limit canned vegetables. If you do use them, rinse them well to decrease the sodium.  When eating at a restaurant, ask that your food be prepared with less salt, or no salt if possible. WHAT FOODS CAN I EAT? Seek help from a dietitian for individual calorie needs. Grains Whole grain or whole wheat bread. Brown rice. Whole grain or whole wheat  pasta. Quinoa, bulgur, and whole grain cereals. Low-sodium cereals. Corn or whole wheat flour tortillas. Whole grain cornbread. Whole grain crackers. Low-sodium crackers. Vegetables Fresh or frozen vegetables (raw, steamed, roasted, or grilled). Low-sodium or reduced-sodium tomato and vegetable juices. Low-sodium or reduced-sodium tomato sauce and paste. Low-sodium or reduced-sodium canned vegetables.  Fruits All fresh, canned (in natural juice), or frozen fruits. Meat and Other Protein Products Ground beef (85% or leaner), grass-fed beef, or beef trimmed of fat. Skinless chicken or [place]. Ground chicken or [place]. Pork trimmed of fat. All fish and seafood. Eggs. Dried beans, peas, or lentils. Unsalted nuts and seeds. Unsalted canned beans. Dairy Low-fat dairy products, such as skim or 1% milk, 2% or reduced-fat cheeses, low-fat ricotta or cottage cheese,  or plain low-fat yogurt. Low-sodium or reduced-sodium cheeses. Fats and Oils Tub margarines without trans fats. Light or reduced-fat mayonnaise and salad dressings (reduced sodium). Avocado. Safflower, olive, or canola oils. Natural peanut or almond butter. Other Unsalted popcorn and pretzels. The items listed above may not be a complete list of recommended foods or beverages. Contact your dietitian for more options. WHAT FOODS ARE NOT RECOMMENDED? Grains White bread. White pasta. White rice. Refined cornbread. Bagels and croissants. Crackers that contain trans fat. Vegetables Creamed or fried vegetables. Vegetables in a cheese sauce. Regular canned vegetables. Regular canned tomato sauce and paste. Regular tomato and vegetable juices. Fruits Dried fruits. Canned fruit in light or heavy syrup. Fruit juice. Meat and Other Protein Products Fatty cuts of meat. Ribs, chicken wings, bacon, sausage, bologna, salami, chitterlings, fatback, hot dogs, bratwurst, and packaged luncheon meats. Salted nuts and seeds. Canned beans with salt. Dairy Whole or 2% milk, cream, half-and-half, and cream cheese. Whole-fat or sweetened yogurt. Full-fat cheeses or blue cheese. Nondairy creamers and whipped toppings. Processed cheese, cheese spreads, or cheese curds. Condiments Onion and garlic salt, seasoned salt, table salt, and sea salt. Canned and packaged gravies. Worcestershire sauce. Tartar sauce. Barbecue sauce. Teriyaki sauce. Soy sauce, including reduced sodium. Steak sauce. Fish sauce. Oyster sauce. Cocktail sauce. Horseradish. Ketchup and mustard. Meat flavorings and tenderizers. Bouillon cubes. Hot sauce. Tabasco sauce. Marinades. Taco seasonings. Relishes. Fats and Oils Butter, stick margarine, lard, shortening, ghee, and bacon fat. Coconut, palm kernel, or palm oils. Regular salad dressings. Other Pickles and olives. Salted popcorn and pretzels. The items listed above may not be a complete list of foods  and beverages to avoid. Contact your dietitian for more information. WHERE CAN I FIND MORE INFORMATION? National Heart, Lung, and Blood Institute: travelstabloid.com   This information is not intended to replace advice given to you by your health care provider. Make sure you discuss any questions you have with your health care provider.   Document Released: 03/21/2011 Document Revised: 04/22/2014 Document Reviewed: 02/03/2013 Elsevier Interactive Patient Education 2016 [employer]. Hypertension Hypertension, commonly called high blood pressure, is when the force of blood pumping through your arteries is too strong. Your arteries are the blood vessels that carry blood from your heart throughout your body. A blood pressure reading consists of a higher number over a lower number, such as 110/72. The higher number (systolic) is the pressure inside your arteries when your heart pumps. The lower number (diastolic) is the pressure inside your arteries when your heart relaxes. Ideally you want your blood pressure below 120/80. Hypertension forces your heart to work harder to pump blood. Your arteries may become narrow or stiff. Having untreated or uncontrolled hypertension can cause heart attack, stroke, kidney disease, and other  problems. RISK FACTORS Some risk factors for high blood pressure are controllable. Others are not.  Risk factors you cannot control include:   Race. You may be at higher risk if you are African American.  Age. Risk increases with age.  Gender. Men are at higher risk than women before age 35 years. After age 5, women are at higher risk than men. Risk factors you can control include:  Not getting enough exercise or physical activity.  Being overweight.  Getting too much fat, sugar, calories, or salt in your diet.  Drinking too much alcohol. SIGNS AND SYMPTOMS Hypertension does not usually cause signs or symptoms. Extremely high blood  pressure (hypertensive crisis) may cause headache, anxiety, shortness of breath, and nosebleed. DIAGNOSIS To check if you have hypertension, your health care provider will measure your blood pressure while you are seated, with your arm held at the level of your heart. It should be measured at least twice using the same arm. Certain conditions can cause a difference in blood pressure between your right and left arms. A blood pressure reading that is higher than normal on one occasion does not mean that you need treatment. If it is not clear whether you have high blood pressure, you may be asked to return on a different day to have your blood pressure checked again. Or, you may be asked to monitor your blood pressure at home for 1 or more weeks. TREATMENT Treating high blood pressure includes making lifestyle changes and possibly taking medicine. Living a healthy lifestyle can help lower high blood pressure. You may need to change some of your habits. Lifestyle changes may include:  Following the DASH diet. This diet is high in fruits, vegetables, and whole grains. It is low in salt, red meat, and added sugars.  Keep your sodium intake below 2,300 mg per day.  Getting at least 30-45 minutes of aerobic exercise at least 4 times per week.  Losing weight if necessary.  Not smoking.  Limiting alcoholic beverages.  Learning ways to reduce stress. Your health care provider may prescribe medicine if lifestyle changes are not enough to get your blood pressure under control, and if one of the following is true:  You are 81-68 years of age and your systolic blood pressure is above 140.  You are 45 years of age or older, and your systolic blood pressure is above 150.  Your diastolic blood pressure is above 90.  You have diabetes, and your systolic blood pressure is over [id number] or your diastolic blood pressure is over 90.  You have kidney disease and your blood pressure is above 140/90.  You have  heart disease and your blood pressure is above 140/90. Your personal target blood pressure may vary depending on your medical conditions, your age, and other factors. HOME CARE INSTRUCTIONS  Have your blood pressure rechecked as directed by your health care provider.   Take medicines only as directed by your health care provider. Follow the directions carefully. Blood pressure medicines must be taken as prescribed. The medicine does not work as well when you skip doses. Skipping doses also puts you at risk for problems.  Do not smoke.   Monitor your blood pressure at home as directed by your health care provider. SEEK MEDICAL CARE IF:   You think you are having a reaction to medicines taken.  You have recurrent headaches or feel dizzy.  You have swelling in your ankles.  You have trouble with your vision. [place]  CARE IF:  You develop a severe headache or confusion.  You have unusual weakness, numbness, or feel faint.  You have severe chest or abdominal pain.  You vomit repeatedly.  You have trouble breathing. MAKE SURE YOU:   Understand these instructions.  Will watch your condition.  Will get help right away if you are not doing well or get worse.   This information is not intended to replace advice given to you by your health care provider. Make sure you discuss any questions you have with your health care provider.   Document Released: 04/01/2005 Document Revised: 08/16/2014 Document Reviewed: 01/22/2013 Elsevier Interactive Patient Education [employer].

## 2015-11-03 ENCOUNTER — Ambulatory Visit: Payer: Medicare Other

## 2015-11-09 ENCOUNTER — Telehealth: Payer: Self-pay | Admitting: *Deleted

## 2015-11-09 ENCOUNTER — Ambulatory Visit: Payer: Medicare Other | Admitting: Nurse Practitioner

## 2015-11-09 NOTE — Telephone Encounter (Signed)
I spoke to pt she had not checked her phone messages yet.  I told her that I called her about her appt today at 1600.   Dr. Krista Blue ordered her to have a carotid US and MRI brain.  Her doppler was done, and was a negative, she had not had her MRI yet.  I told her that Dr. Krista Blue was out of office and since she had not had all the tests done, we would reschedule her appt.  She stated that her dizziness is better, has had 2 episodes of positional dizziness, and had a fall on 11-03-15, fell on R side, she is bruised and sore R foot, arm and knee but is doing better.  She had not heard from the [facility] yet.  I told her that I spoke to them this am and she may have message and to check her messages.  I called [facility] and they did receive order and LM for pt to call them back to schedule.   She has xanax for anxiety and will take that for the MRI, and I told her she will need driver.  She verbalized understanding.  Will cancel her visit today due to needing to have tests done, if anything different will call her back.

## 2015-11-09 NOTE — Telephone Encounter (Signed)
LMVM for pt to return call.  I relayed that one test result is back, and recommended having MRI done, and gave her tele# to call and schedule.  I had refaxed the MRI order to [facility].

## 2015-11-09 NOTE — Telephone Encounter (Signed)
noted 

## 2015-11-16 NOTE — Telephone Encounter (Addendum)
I spoke to pt .  She is asking to take 2 tab of the 0.25mg  for her MRI again on 11-23-15.  Could not handle it yesterday.  I told her that I will check and see is can take 0.5 mg 1 hour , when gets to facility and if needs another dose when goes into scanner.  (this was what we used as out packet when we gave out to pts for MRI).  Pt may use her own .025mg  tabs.

## 2015-11-16 NOTE — Telephone Encounter (Signed)
Patient is calling to ask if it would be ok for her to take 2 Xanax before the MRI. She says 1 pill does not help.

## 2015-11-16 NOTE — Telephone Encounter (Signed)
This is ok I have not seen her before she was canceled on me because she had not had her studies done.

## 2015-11-20 ENCOUNTER — Other Ambulatory Visit: Payer: Self-pay | Admitting: Cardiology

## 2015-11-20 DIAGNOSIS — H5203 Hypermetropia, bilateral: Secondary | ICD-10-CM | POA: Diagnosis not present

## 2015-11-20 DIAGNOSIS — E113413 Type 2 diabetes mellitus with severe nonproliferative diabetic retinopathy with macular edema, bilateral: Secondary | ICD-10-CM | POA: Diagnosis not present

## 2015-11-20 NOTE — Telephone Encounter (Signed)
Spoke to pt and relayed that she can take 0.5mg  (2 of her 0.25mg  tabs) one hour prior to MRI.  She might be taking a cab, I told her that she might take friend with her.  She kept saying that she needed to have done at hospital.  I told her that I would relay to Dr. Rhea Belton nurse who is out today.  Dr. Krista Blue out of office this week.

## 2015-11-22 ENCOUNTER — Encounter: Payer: Self-pay | Admitting: Physical Therapy

## 2015-11-22 ENCOUNTER — Ambulatory Visit: Payer: Medicare Other | Attending: Neurology | Admitting: Physical Therapy

## 2015-11-22 VITALS — BP 167/82 | HR 63

## 2015-11-22 DIAGNOSIS — R2681 Unsteadiness on feet: Secondary | ICD-10-CM | POA: Insufficient documentation

## 2015-11-22 DIAGNOSIS — R2689 Other abnormalities of gait and mobility: Secondary | ICD-10-CM | POA: Insufficient documentation

## 2015-11-22 DIAGNOSIS — R42 Dizziness and giddiness: Secondary | ICD-10-CM

## 2015-11-22 NOTE — Patient Instructions (Signed)
Gaze Stabilization: Sitting    Keeping eyes on target ("A") on wall _4__ feet away, tilt head down slightly and move head side to side for __30__ seconds. Repeat while moving head up and down for _30___ seconds. Do __2__ sessions per day.  Gaze Stabilization: Tip Card  1.Target ("A") must remain in focus, not blurry, and appear stationary while head is in motion. 2.Perform exercises with small head movements (45 to either side of midline). 3.Increase speed of head motion so long as target is in focus.

## 2015-11-22 NOTE — Therapy (Addendum)
[facility] [address] [place], [place], [address] Phone: [phone]   Fax:  [phone]  Physical Therapy Evaluation and Discharge Summary  Patient Details  Name: Brooke Stafford MRN: [id number] Date of Birth: 11-18-1955 Referring Provider: Marcial Pacas, MD  Encounter Date: 11/22/2015      PT End of Session - 11/22/15 1730    Visit Number 1   Number of Visits 9  eval + 8 visits   Date for PT Re-Evaluation 12/22/15   Authorization Type MCR Traditional primary; MCD secondary   Authorization Time Period G Codes required   PT Start Time 1342   PT Stop Time 1436   PT Time Calculation (min) 54 min   Equipment Utilized During Treatment Gait belt   Activity Tolerance Patient limited by fatigue  walking tolerance limited by fatigue/pain in BLE's   Behavior During Therapy [facility] for tasks assessed/performed      Past Medical History:  Diagnosis Date  . Anemia   . Anginal pain ([place])   . Anxiety   . Arthritis    "stiff fingers and knees" (08/04/2013), (12/12/2014)  . Asthma   . CAD (coronary artery disease) 2002; 2015   CABG x 6 2002, cath 2011- med Rx stent DES VG-Diag  . CAD (coronary artery disease) of artery bypass graft; DES to VG-Diag 09/28/13 11/09/2013  . Cataract   . CHF (congestive heart failure) ([place])    "in 2002" (11/26/2012)  . Chronic bronchitis ([place])    "q year; in the winter"   . Chronic renal insufficiency, stage II (mild)    followed  by [facility]  . Coronary artery disease 2002   CABG x 6. Cath 5/11- med Rx  . GERD (gastroesophageal reflux disease)   . Gout    "right big toe"  . Headache    "~ q week" (08/04/2013); "~ twice/month" (12/12/2014)  . History of blood transfusion 2002   "when I had OHS"  . Hyperlipidemia   . Hypertension   . Hypothyroid    treated  . Migraines    "couple times/year" (08/04/2013), (12/12/2014)  . Myocardial infarction ([place]) 2000; 2002; 2011  . Obesity (BMI  35.0-39.9 without comorbidity) ([place])   . Peripheral vascular disease ([place]) 12/12   LSFA PTA  . Pneumonia    "3 times I think" (12/12/2014)  . Type II diabetes mellitus ([place])     Past Surgical History:  Procedure Laterality Date  . ABDOMINAL AORTAGRAM N/A 04/05/2011   Procedure: ABDOMINAL AORTAGRAM;  Surgeon: Lorretta Harp, MD;  Location: [facility] CATH LAB;  Service: Cardiovascular;  Laterality: N/A;  . APPENDECTOMY  1980  . BREAST CYST EXCISION Right 1970's  . CARDIAC CATHETERIZATION  2002  . CARDIAC CATHETERIZATION N/A 12/12/2014   Procedure: Left Heart Cath and Cors/Grafts Angiography;  Surgeon: Peter M Martinique, MD;  Location: [facility] CV LAB;  Service: Cardiovascular;  Laterality: N/A;  . CARDIAC CATHETERIZATION N/A 12/12/2014   Procedure: Coronary Stent Intervention;  Surgeon: Peter M Martinique, MD;  Location: [facility] CV LAB;  Service: Cardiovascular;  Laterality: N/A;  . [place]; 1980  . CHOLECYSTECTOMY  1982  . CORONARY ANGIOPLASTY WITH STENT PLACEMENT  2004; 2012   "I have 2 stents" (08/04/2013)  . CORONARY ANGIOPLASTY WITH STENT PLACEMENT  09/28/13   PTCA/ DES Xience stent to VG-Diag   . CORONARY ARTERY BYPASS GRAFT  11/20/2000   x6 LIMA to distal LAD, svg to first diag, svg to ramus intermediate  branch and swquential SVG to cir marginal branch, SVG to posterior descending coronary and sequential SVG to first right posterolateral branch  . LEFT HEART CATHETERIZATION WITH CORONARY/GRAFT ANGIOGRAM N/A 09/28/2013   Procedure: LEFT HEART CATHETERIZATION WITH Beatrix Fetters;  Surgeon: Peter M Martinique, MD;  Location: [facility] CATH LAB;  Service: Cardiovascular;  Laterality: N/A;  . LOWER EXTREMITY ANGIOGRAM  12/01/2012   Procedure: LOWER EXTREMITY ANGIOGRAM;  Surgeon: Lorretta Harp, MD;  Location: [facility] CATH LAB;  Service: Cardiovascular;;  . NM MYOCAR PERF WALL MOTION  08/27/2004   negative  . PERCUTANEOUS STENT INTERVENTION Left 12/01/2012   Procedure: PERCUTANEOUS STENT  INTERVENTION;  Surgeon: Lorretta Harp, MD;  Location: [facility] CATH LAB;  Service: Cardiovascular;  Laterality: Left;  Left SFA  . PERIPHERAL ARTERIAL STENT GRAFT Left    SFA/notes 04/07/2011 (11/30/2012)  . RENAL ANGIOGRAM N/A 04/05/2011   Procedure: RENAL ANGIOGRAM;  Surgeon: Lorretta Harp, MD;  Location: [facility] CATH LAB;  Service: Cardiovascular;  Laterality: N/A;  . TUBAL LIGATION  1980    Vitals:   11/22/15 1422 11/22/15 1424  BP: (!) 174/83 (!) 167/82  Pulse: 65 63         Subjective Assessment - 11/22/15 1349    Subjective Note that patient's son was recently murdered when pt was hospitalized; pt reports difficulty coping. Pt reports "extremely bad headaches" for 2 months prior to onset of dizziness. Pt sought medical attention due to dizziness and difficulty "focusing my hands good". Pt hospitalized; CT scan of brain identified CVA.  Prior to onset of dizziness, pt was independent with all mobility. Since dizziness began, pt has been using SPC. Pt has sustained 2 falls (both backwards) since being DC'ed from hospital.    Pertinent History PMH significant for: PVD, HTN, CAD s/p CABG, unstable angina, CVA, DM II, hypothyroidism, depression, adjustment disorder with anxious mood, RA, cataracts, GERD   Diagnostic tests CT of head (10/22/15): small lacunar infarcts in the posterior limb of the internal capsule and posterior left mid left cerebellar hemisphere   Patient Stated Goals "I just want to walk straight, I guess."   Currently in Pain? Yes   Pain Score 8    Pain Location Leg   Pain Orientation Left;Right   Pain Descriptors / Indicators Aching   Pain Type Acute pain   Pain Onset More than a month ago   Pain Frequency Intermittent   Aggravating Factors  prolonged walking (especially without a cane)   Pain Relieving Factors sitting down            [facility] PT Assessment - 11/22/15 0001      Assessment   Medical Diagnosis Abnormality of Gait   Referring Provider Marcial Pacas, MD    Onset Date/Surgical Date 10/22/15     Precautions   Precautions Fall     Restrictions   Weight Bearing Restrictions No     Balance Screen   Has the patient fallen in the past 6 months Yes   How many times? 2   Has the patient had a decrease in activity level because of a fear of falling?  Yes   Is the patient reluctant to leave their home because of a fear of falling?  Yes     [place] residence   Living Arrangements Other (Comment)  with companion   Type of [place] to enter   Entrance Stairs-Number of Steps 5   Entrance Stairs-Rails Can  reach both   Home Layout One level   [place] - single point   Additional Comments Pt has difficulty with stair negotiation.     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     Cognition   Overall Cognitive Status Within Functional Limits for tasks assessed            Vestibular Assessment - 11/22/15 0001      Symptom Behavior   Type of Dizziness Imbalance   Frequency of Dizziness daily   Duration of Dizziness seconds   Aggravating Factors Turning body quickly;Turning head quickly;Forward bending   Relieving Factors Head stationary     Occulomotor Exam   Occulomotor Alignment Normal   Spontaneous Absent   Gaze-induced Absent   Smooth Pursuits Intact   Saccades Dysmetria  L eye dysmetric; pt symptomatic     Vestibulo-Occular Reflex   VOR 1 Head Only (x 1 viewing) Dizziness with x1 viewing with horizontal > vertical head movements.    Comment Convergence impaired L eye. Convergence testing increases dizziness               [facility] Adult PT Treatment/Exercise - 11/22/15 0001      Transfers   Transfers Sit to Stand;Stand to Sit   Sit to Stand 5: Supervision   Sit to Stand Details (indicate cue type and reason) requires UE use for sit >  stand from standard chair   Stand to Sit 5: Supervision     Ambulation/Gait   Ambulation/Gait Yes   Ambulation/Gait Assistance 4: Min guard;4: Min assist   Ambulation/Gait Assistance Details 2 x50' with SPC with (S); x120' without AD with min guard-min A due to increased instability with turning. Gait distance limited to 120' by fatigue, pain in BLE's.   Ambulation Distance (Feet) 120 Feet   Assistive device Straight cane;None   Gait Pattern Step-through pattern;Decreased arm swing - right;Decreased arm swing - left;Decreased stride length;Decreased weight shift to right;Decreased trunk rotation;Wide base of support  turns en bloc; dec step length with turning   Ambulation Surface Level;Indoor   Gait velocity 0.90 ft/sec   Gait Comments Cueing for gaze fixation, corrective saccades (as compensatory strategy for impaired VOR) with turning during gait.         Vestibular Treatment/Exercise - 11/22/15 0001      Vestibular Treatment/Exercise   Vestibular Treatment Provided Gaze   Gaze Exercises X1 Viewing Horizontal;X1 Viewing Vertical     X1 Viewing Horizontal   Foot Position seated   Reps 1   Comments x30 seconds; cueing for technique     X1 Viewing Vertical   Foot Position seated   Reps 1   Comments x30 seconds                 PT Short Term Goals - 11/22/15 1725      PT SHORT TERM GOAL #1   Title STG's = LTG's           PT Long Term Goals - 11/22/15 1725      PT LONG TERM GOAL #1   Title Pt will independently perform HEP to maximize functional gains made in PT.  (Target date: 12/20/15)     PT LONG TERM GOAL #2   Title Complete Berg and improve score by 4 points from baseline to indicate significant improvement in standing balance.  (12/20/15)  PT LONG TERM GOAL #3   Title Pt will ambulate x500' over level, indoor and unlevel, paved surfaces with mod I using LRAD to indicate increased activity tolerance, safety with community mobility.  (12/20/15)     PT  LONG TERM GOAL #4   Title Pt will negotiate standard ramp and curb step with mod I using LRAD to indicate increased safety traversing community obstacles.  (12/20/15)     PT LONG TERM GOAL #5   Title Pt will improve ABC Scale score from 40% to >/= 53% to indicate significant improvement in balance confidence.  (12/20/15)     Additional Long Term Goals   Additional Long Term Goals Yes     PT LONG TERM GOAL #6   Title Pt will improve gait velocity from 0.90 ft/sec to >/= 1.8 ft/sec to indicate decreased risk of recurrent falls.  (12/20/15)               Plan - 11/22/15 1721    Clinical Impression Statement Pt is a 60 y/o F referred to outpatient PT to address gait impairments. PMH significant for: PVD, HTN, CAD s/p CABG, unstable angina, CVA, DM II, hypothyroidism, depression, adjustment disorder with anxious mood, RA, cataracts, GERD. PT evlauation reveals the following: gait impairments, dizziness and imbalance with functional head/body turning/movement, impaired oculomotor coordination (L monocular), convergence insufficiency, impaired VOR with horizontal > vertical head movement; gait velocity suggestive of recurrent fall risk; impaired standing balance; decreased activity tolerance (due to pain in B LE's with walking), decreased balance confidence, and frequent falls. Recommending skilled PT 2x/week for 8 weeks to address said impairments; however, pt requesting 2x/week for 4 weeks.    Rehab Potential Fair   Clinical Impairments Affecting Rehab Potential multiple comorbidities   PT Frequency 2x / week   PT Duration 4 weeks   PT Treatment/Interventions ADLs/Self Care Home Management;Balance training;Therapeutic exercise;Neuromuscular re-education;Stair training;Gait training;DME Instruction;Functional mobility training;Therapeutic activities;Patient/family education;Vestibular   PT Next Visit Plan Complete BERG. Initiate balance HEP.    Consulted and Agree with Plan of Care Patient       Patient will benefit from skilled therapeutic intervention in order to improve the following deficits and impairments:  Impaired vision/preception, Abnormal gait, Decreased activity tolerance, Decreased knowledge of use of DME, Impaired perceived functional ability, Dizziness, Decreased endurance, Decreased balance  Visit Diagnosis: Other abnormalities of gait and mobility - Plan: PT plan of care cert/re-cert  Unsteadiness on feet - Plan: PT plan of care cert/re-cert  Dizziness and giddiness - Plan: PT plan of care cert/re-cert      G-Codes - 34/74/25 1738    Functional Assessment Tool Used gait velocity = 0.9 ft/sec   Functional Limitation Mobility: Walking and moving around   Mobility: Walking and Moving Around Current Status [id number]) At least 60 percent but less than 80 percent impaired, limited or restricted   Mobility: Walking and Moving Around Goal Status [phone]) At least 40 percent but less than 60 percent impaired, limited or restricted       Problem List Patient Active Problem List   Diagnosis Date Noted  . Adjustment disorder with anxious mood 11/02/2015  . Posterior circulation stroke ([place]) 11/02/2015  . Abnormality of gait 10/23/2015  . Depression 09/28/2015  . Type 2 diabetes mellitus with stage 3 chronic kidney disease, with long-term current use of insulin ([place]) 09/13/2015  . Diabetic retinopathy ([place]) 09/13/2015  . HTN (hypertension), benign 08/24/2015  . Grief reaction 08/24/2015  . Pain in the chest   .  Acute kidney injury superimposed on chronic kidney disease ([place]) 07/25/2015  . Creatinine elevation 07/25/2015  . Chest pain at rest 07/25/2015  . Chronic combined systolic and diastolic heart failure ([place]) 07/25/2015  . Rash of hands 02/23/2015  . Essential hypertension, benign 02/23/2015  . Hypertension   . Angina pectoris ([place]) 12/12/2014  . Abnormal nuclear stress test   . Acute combined systolic and diastolic heart failure ([place]) 12/02/2014  .  Seasonal allergies 08/11/2014  . Gout of big toe 08/11/2014  . Encounter for screening mammogram for breast cancer 05/16/2014  . Screening for colon cancer 05/16/2014  . Diabetic neuropathy, type II diabetes mellitus ([place]) 01/27/2014  . Atopic eczema 01/27/2014  . Precordial pain, atypical, negative MI, Musculature Skeletal pain  11/08/2013  . CKD (chronic kidney disease), stage III 11/08/2013  . Unstable angina ([place]) 09/28/2013  . Ischemic cardiomyopathy- new drop in EF 08/30/2013  . Chest pain 08/04/2013  . CAD -S/P PCI June 2015 and 12/14/14 02/01/2013  . Hypothyroidism 02/01/2013  . PVD, LSFA PTA 12/12 04/06/2011  . HTN (hypertension), malignant, patent renal arteries 04/06/2011  . Hx of CABG x 6 2002 04/06/2011  . Dyslipidemia 04/06/2011   Billie Ruddy, PT, DPT [facility] [address] [place], [place], [address] Phone: [phone]   Fax:  [phone] 11/22/15, 5:40 PM  Name: Brooke Stafford MRN: [id number] Date of Birth: 06/15/1955    PHYSICAL THERAPY DISCHARGE SUMMARY  Visits from Start of Care: 1  Current functional level related to goals / functional outcomes: Unknown, as patient did not return to PT after initial evaluation.     Remaining deficits: Unknown, as patient did not return to PT after initial evaluation.    Education / Equipment: See above.  Plan: Patient agrees to discharge.  Patient goals were not met. Patient is being discharged due to not returning since the last visit.  ?????        Billie Ruddy, PT, [facility] [address] [place], [place], [address] Phone: [phone]   Fax:  [phone] 03/05/16, 8:44 AM

## 2015-11-24 ENCOUNTER — Other Ambulatory Visit: Payer: Self-pay | Admitting: Internal Medicine

## 2015-11-27 ENCOUNTER — Ambulatory Visit: Payer: Medicare Other | Admitting: Physical Therapy

## 2015-11-30 ENCOUNTER — Encounter: Payer: Self-pay | Admitting: Internal Medicine

## 2015-11-30 ENCOUNTER — Ambulatory Visit: Payer: Medicare Other | Attending: Internal Medicine | Admitting: Internal Medicine

## 2015-11-30 VITALS — BP 163/82 | HR 69 | Temp 98.3°F | Resp 18 | Ht 70.5 in | Wt 252.4 lb

## 2015-11-30 DIAGNOSIS — J449 Chronic obstructive pulmonary disease, unspecified: Secondary | ICD-10-CM | POA: Diagnosis not present

## 2015-11-30 DIAGNOSIS — Z794 Long term (current) use of insulin: Secondary | ICD-10-CM | POA: Insufficient documentation

## 2015-11-30 DIAGNOSIS — N183 Chronic kidney disease, stage 3 (moderate): Secondary | ICD-10-CM | POA: Diagnosis not present

## 2015-11-30 DIAGNOSIS — E039 Hypothyroidism, unspecified: Secondary | ICD-10-CM | POA: Diagnosis not present

## 2015-11-30 DIAGNOSIS — Z0000 Encounter for general adult medical examination without abnormal findings: Secondary | ICD-10-CM

## 2015-11-30 DIAGNOSIS — I130 Hypertensive heart and chronic kidney disease with heart failure and stage 1 through stage 4 chronic kidney disease, or unspecified chronic kidney disease: Secondary | ICD-10-CM | POA: Insufficient documentation

## 2015-11-30 DIAGNOSIS — E1122 Type 2 diabetes mellitus with diabetic chronic kidney disease: Secondary | ICD-10-CM

## 2015-11-30 DIAGNOSIS — E669 Obesity, unspecified: Secondary | ICD-10-CM | POA: Diagnosis not present

## 2015-11-30 DIAGNOSIS — D649 Anemia, unspecified: Secondary | ICD-10-CM | POA: Insufficient documentation

## 2015-11-30 DIAGNOSIS — I739 Peripheral vascular disease, unspecified: Secondary | ICD-10-CM | POA: Diagnosis not present

## 2015-11-30 DIAGNOSIS — E785 Hyperlipidemia, unspecified: Secondary | ICD-10-CM | POA: Insufficient documentation

## 2015-11-30 DIAGNOSIS — Z880 Allergy status to penicillin: Secondary | ICD-10-CM | POA: Insufficient documentation

## 2015-11-30 DIAGNOSIS — I5042 Chronic combined systolic (congestive) and diastolic (congestive) heart failure: Secondary | ICD-10-CM | POA: Diagnosis not present

## 2015-11-30 DIAGNOSIS — Z888 Allergy status to other drugs, medicaments and biological substances status: Secondary | ICD-10-CM | POA: Insufficient documentation

## 2015-11-30 DIAGNOSIS — I200 Unstable angina: Secondary | ICD-10-CM | POA: Diagnosis not present

## 2015-11-30 DIAGNOSIS — R51 Headache: Secondary | ICD-10-CM | POA: Diagnosis not present

## 2015-11-30 DIAGNOSIS — R42 Dizziness and giddiness: Secondary | ICD-10-CM | POA: Diagnosis not present

## 2015-11-30 DIAGNOSIS — I25810 Atherosclerosis of coronary artery bypass graft(s) without angina pectoris: Secondary | ICD-10-CM | POA: Insufficient documentation

## 2015-11-30 DIAGNOSIS — I2510 Atherosclerotic heart disease of native coronary artery without angina pectoris: Secondary | ICD-10-CM | POA: Diagnosis not present

## 2015-11-30 DIAGNOSIS — M170 Bilateral primary osteoarthritis of knee: Secondary | ICD-10-CM | POA: Insufficient documentation

## 2015-11-30 DIAGNOSIS — Z7982 Long term (current) use of aspirin: Secondary | ICD-10-CM | POA: Diagnosis not present

## 2015-11-30 DIAGNOSIS — I10 Essential (primary) hypertension: Secondary | ICD-10-CM

## 2015-11-30 DIAGNOSIS — M19041 Primary osteoarthritis, right hand: Secondary | ICD-10-CM | POA: Insufficient documentation

## 2015-11-30 DIAGNOSIS — K219 Gastro-esophageal reflux disease without esophagitis: Secondary | ICD-10-CM | POA: Diagnosis not present

## 2015-11-30 DIAGNOSIS — F4322 Adjustment disorder with anxiety: Secondary | ICD-10-CM | POA: Insufficient documentation

## 2015-11-30 DIAGNOSIS — N184 Chronic kidney disease, stage 4 (severe): Secondary | ICD-10-CM | POA: Diagnosis not present

## 2015-11-30 DIAGNOSIS — Z9109 Other allergy status, other than to drugs and biological substances: Secondary | ICD-10-CM | POA: Insufficient documentation

## 2015-11-30 DIAGNOSIS — I252 Old myocardial infarction: Secondary | ICD-10-CM | POA: Diagnosis not present

## 2015-11-30 DIAGNOSIS — M19042 Primary osteoarthritis, left hand: Secondary | ICD-10-CM | POA: Diagnosis not present

## 2015-11-30 DIAGNOSIS — M109 Gout, unspecified: Secondary | ICD-10-CM | POA: Insufficient documentation

## 2015-11-30 DIAGNOSIS — Z23 Encounter for immunization: Secondary | ICD-10-CM | POA: Diagnosis not present

## 2015-11-30 DIAGNOSIS — Z6835 Body mass index (BMI) 35.0-35.9, adult: Secondary | ICD-10-CM | POA: Insufficient documentation

## 2015-11-30 DIAGNOSIS — Z8701 Personal history of pneumonia (recurrent): Secondary | ICD-10-CM | POA: Insufficient documentation

## 2015-11-30 LAB — GLUCOSE, POCT (MANUAL RESULT ENTRY): POC GLUCOSE: 124 mg/dL — AB (ref 70–99)

## 2015-11-30 MED ORDER — CLONAZEPAM 0.5 MG PO TABS
0.5000 mg | ORAL_TABLET | Freq: Two times a day (BID) | ORAL | 1 refills | Status: DC | PRN
Start: 2015-11-30 — End: 2016-02-22

## 2015-11-30 NOTE — Progress Notes (Signed)
Brooke Stafford, is a 60 y.o. female  [id number]  [id number]  DOB - 1955/12/17  Chief Complaint  Patient presents with  . Dizziness        Subjective:   Brooke Stafford is a 60 y.o. female here today for a follow up visit. Patient has multiple medical history including advanced coronary artery disease with previous bypass surgery and stent placement, chronic systolic and diastolic heart failure with occasional acute exacerbation, insulin requiring type 2 diabetes mellitus complicated by chronic kidney disease stage III-IV, peripheral arterial disease, malignant hypertension, and Hyperlipidemia. Patient complains of feeling "fuzzy". She said she had a headache last night in her sleep and sharp pain shooting down her right leg and right arm numbness and blurred vision when she woke up. She said she thought she may have had a "mini stroke", but everything is back to baseline right now. Patient denies symptoms at this time. Patient has been having panic attacks and grief reaction since losing her son to gun shots recently. She denies any chest pain or SOB at this time. She denies any suicidal ideations or thoughts, but she is having difficulty adjusting to reality of her son being "gone forever".   ALLERGIES: Allergies  Allergen Reactions  . Digoxin And Related Other (See Comments)    Patient stated she almost died. Had flu like symptoms as well as diarrhea.  . Hydralazine Shortness Of Breath  . Lisinopril Other (See Comments)    Felt like she had the flu. Was very sick!!!  . Penicillins Cross Reactors Hives    And high fever  . Adhesive [Tape] Rash    bruising    PAST MEDICAL HISTORY: Past Medical History:  Diagnosis Date  . Anemia   . Anginal pain ([place])   . Anxiety   . Arthritis    "stiff fingers and knees" (08/04/2013), (12/12/2014)  . Asthma   . CAD (coronary artery disease) 2002; 2015   CABG x 6 2002, cath 2011- med Rx stent DES VG-Diag  . CAD (coronary artery  disease) of artery bypass graft; DES to VG-Diag 09/28/13 11/09/2013  . Cataract   . CHF (congestive heart failure) ([place])    "in 2002" (11/26/2012)  . Chronic bronchitis ([place])    "q year; in the winter"   . Chronic renal insufficiency, stage II (mild)    followed  by [facility]  . Coronary artery disease 2002   CABG x 6. Cath 5/11- med Rx  . GERD (gastroesophageal reflux disease)   . Gout    "right big toe"  . Headache    "~ q week" (08/04/2013); "~ twice/month" (12/12/2014)  . History of blood transfusion 2002   "when I had OHS"  . Hyperlipidemia   . Hypertension   . Hypothyroid    treated  . Migraines    "couple times/year" (08/04/2013), (12/12/2014)  . Myocardial infarction ([place]) 2000; 2002; 2011  . Obesity (BMI 35.0-39.9 without comorbidity) ([place])   . Peripheral vascular disease ([place]) 12/12   LSFA PTA  . Pneumonia    "3 times I think" (12/12/2014)  . Type II diabetes mellitus ([place])     MEDICATIONS AT HOME: Prior to Admission medications   Medication Sig Start Date End Date Taking? Authorizing Provider  acetaminophen-codeine (TYLENOL #3) 300-30 MG tablet Take 1 tablet by mouth every 4 (four) hours as needed for moderate pain. 08/24/15  Yes Tresa Garter, MD  albuterol (PROVENTIL HFA;VENTOLIN HFA) 108 (90 Base) MCG/ACT inhaler Inhale 2 puffs into the  lungs every 6 (six) hours as needed for wheezing or shortness of breath. 09/28/15  Yes Tresa Garter, MD  allopurinol (ZYLOPRIM) 300 MG tablet Take 1 tablet (300 mg total) by mouth daily. 07/28/15  Yes Ripudeep Krystal Eaton, MD  amLODipine (NORVASC) 10 MG tablet take 1 tablet by mouth once daily 11/20/15  Yes Mihai Croitoru, MD  aspirin 81 MG chewable tablet Chew 1 tablet (81 mg total) by mouth daily. 02/24/15  Yes Mihai Croitoru, MD  atorvastatin (LIPITOR) 40 MG tablet Take 1 tablet (40 mg total) by mouth daily. 07/28/15  Yes Ripudeep Krystal Eaton, MD  cloNIDine (CATAPRES) 0.3 MG tablet Take 1 tablet (0.3 mg total) by mouth 2 (two) times  daily. 11/02/15  Yes Tresa Garter, MD  clopidogrel (PLAVIX) 75 MG tablet Take 1 tablet (75 mg total) by mouth daily. 09/25/15  Yes Mihai Croitoru, MD  ezetimibe (ZETIA) 10 MG tablet Take 1 tablet (10 mg total) by mouth daily. 07/28/15  Yes Ripudeep Krystal Eaton, MD  fluticasone (FLONASE) 50 MCG/ACT nasal spray Place 2 sprays into both nostrils daily as needed for allergies or rhinitis. 09/28/15  Yes Tresa Garter, MD  furosemide (LASIX) 40 MG tablet Take 1 tablet (40 mg total) by mouth daily. 07/28/15  Yes Ripudeep Krystal Eaton, MD  gabapentin (NEURONTIN) 400 MG capsule take 1 capsule by mouth three times a day 02/16/15  Yes Maysel Mccolm E Doreene Burke, MD  insulin aspart (NOVOLOG FLEXPEN) 100 UNIT/ML FlexPen Inject 10-18 units into the skin 3 times daily, plus sliding scale as instructed. 09/13/15  Yes Philemon Kingdom, MD  Insulin Glargine (TOUJEO SOLOSTAR) 300 UNIT/ML SOPN Inject 40 Units into the skin at bedtime. 09/13/15  Yes Philemon Kingdom, MD  Insulin Pen Needle 31G X 5 MM MISC Use to inject insulin 4 times daily as instructed. 02/16/15  Yes Philemon Kingdom, MD  Insulin Syringes, Disposable, (B-D INSULIN SYRINGE 1CC) U-100 1 ML MISC Use to inject insulin 4 times daily as instructed. 02/22/14  Yes Philemon Kingdom, MD  isosorbide mononitrate (IMDUR) 120 MG 24 hr tablet Take 1 tablet (120 mg total) by mouth daily. 07/28/15  Yes Ripudeep Krystal Eaton, MD  levothyroxine (SYNTHROID, LEVOTHROID) 25 MCG tablet take 1 tablet by mouth every morning BEFORE BREAKFAST 11/24/15  Yes Tresa Garter, MD  loratadine (CLARITIN) 10 MG tablet Take 1 tablet (10 mg total) by mouth daily as needed for allergies. 12/15/14  Yes Luke K Kilroy, PA-C  magnesium citrate SOLN Take 296 mLs (1 Bottle total) by mouth as needed for severe constipation. 07/28/15  Yes Ripudeep Krystal Eaton, MD  Menthol-Camphor (TIGER BALM ARTHRITIS RUB) 11-11 % CREA Apply 1 application topically 2 (two) times daily.   Yes Historical Provider, MD  metoprolol succinate  (TOPROL-XL) 50 MG 24 hr tablet Take 3 tablets (150 mg total) by mouth daily. Take with or immediately following a meal. 07/28/15  Yes Ripudeep K Rai, MD  nitroGLYCERIN (NITROSTAT) 0.4 MG SL tablet Place 1 tablet (0.4 mg total) under the tongue every 5 (five) minutes as needed. For chest 11/10/14  Yes Jalaysha Skilton E Doreene Burke, MD  ranitidine (ZANTAC 75) 75 MG tablet Take 1 tablet (75 mg total) by mouth 2 (two) times daily. 07/28/15  Yes Ripudeep Krystal Eaton, MD  traZODone (DESYREL) 50 MG tablet Take 1 tablet (50 mg total) by mouth at bedtime. 09/28/15  Yes Tresa Garter, MD  triamcinolone ointment (KENALOG) 0.5 % Apply 1 application topically 2 (two) times daily. 08/24/15  Yes Tresa Garter, MD  clonazePAM (KLONOPIN) 0.5 MG tablet Take 1 tablet (0.5 mg total) by mouth 2 (two) times daily as needed for anxiety. 11/30/15 12/30/15  Tresa Garter, MD     Objective:   Vitals:   11/30/15 1632  BP: (!) 163/82  Pulse: 69  Resp: 18  Temp: 98.3 F (36.8 C)  TempSrc: Oral  SpO2: 98%  Weight: 252 lb 6.4 oz (114.5 kg)  Height: 5' 10.5" (1.791 m)    Exam General appearance : Awake, alert, not in any distress. Speech Clear. Not toxic looking HEENT: Atraumatic and Normocephalic, pupils equally reactive to light and accomodation Neck: Supple, no JVD. No cervical lymphadenopathy.  Chest: Good air entry bilaterally, no added sounds  CVS: S1 S2 regular, no murmurs.  Abdomen: Bowel sounds present, Non tender and not distended with no gaurding, rigidity or rebound. Extremities: B/L Lower Ext shows no edema, both legs are warm to touch Neurology: Awake alert, and oriented X 3, CN II-XII intact, Non focal Skin: No Rash  Data Review Lab Results  Component Value Date   HGBA1C 6.2 11/02/2015   HGBA1C 7.2 (H) 07/25/2015   HGBA1C 6.40 02/23/2015     Assessment & Plan   1. Type 2 diabetes mellitus with stage 3 chronic kidney disease, with long-term current use of insulin (HCC)  - Glucose (CBG)  2.  Healthcare maintenance  - Flu Vaccine QUAD 36+ mos PF IM (Fluarix & Fluzone Quad PF)  3. Adjustment disorder with anxious mood  - clonazePAM (KLONOPIN) 0.5 MG tablet; Take 1 tablet (0.5 mg total) by mouth 2 (two) times daily as needed for anxiety.  Dispense: 60 tablet; Refill: 1  - Ambulatory referral to Psychiatry  4. Essential hypertension: Uncontrolled  We have discussed target BP range and blood pressure goal. I have advised patient to check BP regularly and to call us back or report to clinic if the numbers are consistently higher than 140/90. We discussed the importance of compliance with medical therapy and DASH diet recommended, consequences of uncontrolled hypertension discussed.  - continue current BP medications  Patient have been counseled extensively about nutrition and exercise  Return in about 2 months (around 01/30/2016) for Routine Follow Up, Hemoglobin A1C and Follow up, DM, Follow up HTN.  The patient was given clear instructions to go to ER or return to medical center if symptoms don't improve, worsen or new problems develop. The patient verbalized understanding. The patient was told to call to get lab results if they haven't heard anything in the next week.   This note has been created with Surveyor, quantity. Any transcriptional errors are unintentional.    Angelica Chessman, MD, [place], Karilyn [place], [place] and [place], [place]   11/30/2015, 4:55 PM

## 2015-11-30 NOTE — Patient Instructions (Signed)
Major Depressive Disorder Major depressive disorder is a mental illness. It also may be called clinical depression or unipolar depression. Major depressive disorder usually causes feelings of sadness, hopelessness, or helplessness. Some people with this disorder do not feel particularly sad but lose interest in doing things they used to enjoy (anhedonia). Major depressive disorder also can cause physical symptoms. It can interfere with work, school, relationships, and other normal everyday activities. The disorder varies in severity but is longer lasting and more serious than the sadness we all feel from time to time in our lives. Major depressive disorder often is triggered by stressful life events or major life changes. Examples of these triggers include divorce, loss of your job or home, a move, and the death of a family member or close friend. Sometimes this disorder occurs for no obvious reason at all. People who have family members with major depressive disorder or bipolar disorder are at higher risk for developing this disorder, with or without life stressors. Major depressive disorder can occur at any age. It may occur just once in your life (single episode major depressive disorder). It may occur multiple times (recurrent major depressive disorder). SYMPTOMS People with major depressive disorder have either anhedonia or depressed mood on nearly a daily basis for at least 2 weeks or longer. Symptoms of depressed mood include:  Feelings of sadness (blue or down in the dumps) or emptiness.  Feelings of hopelessness or helplessness.  Tearfulness or episodes of crying (may be observed by others).  Irritability (children and adolescents). In addition to depressed mood or anhedonia or both, people with this disorder have at least four of the following symptoms:  Difficulty sleeping or sleeping too much.   Significant change (increase or decrease) in appetite or weight.   Lack of energy or  motivation.  Feelings of guilt and worthlessness.   Difficulty concentrating, remembering, or making decisions.  Unusually slow movement (psychomotor retardation) or restlessness (as observed by others).   Recurrent wishes for death, recurrent thoughts of self-harm (suicide), or a suicide attempt. People with major depressive disorder commonly have persistent negative thoughts about themselves, other people, and the world. People with severe major depressive disorder may experiencedistorted beliefs or perceptions about the world (psychotic delusions). They also may see or hear things that are not real (psychotic hallucinations). DIAGNOSIS Major depressive disorder is diagnosed through an assessment by your health care provider. Your health care provider will ask aboutaspects of your daily life, such as mood,sleep, and appetite, to see if you have the diagnostic symptoms of major depressive disorder. Your health care provider may ask about your medical history and use of alcohol or drugs, including prescription medicines. Your health care provider also may do a physical exam and blood work. This is because certain medical conditions and the use of certain substances can cause major depressive disorder-like symptoms (secondary depression). Your health care provider also may refer you to a mental health specialist for further evaluation and treatment. TREATMENT It is important to recognize the symptoms of major depressive disorder and seek treatment. The following treatments can be prescribed for this disorder:   Medicine. Antidepressant medicines usually are prescribed. Antidepressant medicines are thought to correct chemical imbalances in the brain that are commonly associated with major depressive disorder. Other types of medicine may be added if the symptoms do not respond to antidepressant medicines alone or if psychotic delusions or hallucinations occur.  Talk therapy. Talk therapy can be  helpful in treating major depressive disorder by providing   support, education, and guidance. Certain types of talk therapy also can help with negative thinking (cognitive behavioral therapy) and with relationship issues that trigger this disorder (interpersonal therapy). A mental health specialist can help determine which treatment is best for you. Most people with major depressive disorder do well with a combination of medicine and talk therapy. Treatments involving electrical stimulation of the brain can be used in situations with extremely severe symptoms or when medicine and talk therapy do not work over time. These treatments include electroconvulsive therapy, transcranial magnetic stimulation, and vagal nerve stimulation.   This information is not intended to replace advice given to you by your health care provider. Make sure you discuss any questions you have with your health care provider.   Document Released: 07/27/2012 Document Revised: 04/22/2014 Document Reviewed: 07/27/2012 Elsevier Interactive Patient Education 2016 Elsevier Inc. Generalized Anxiety Disorder Generalized anxiety disorder (GAD) is a mental disorder. It interferes with life functions, including relationships, work, and school. GAD is different from normal anxiety, which everyone experiences at some point in their lives in response to specific life events and activities. Normal anxiety actually helps [place] prepare for and get through these life events and activities. Normal anxiety goes away after the event or activity is over.  GAD causes anxiety that is not necessarily related to specific events or activities. It also causes excess anxiety in proportion to specific events or activities. The anxiety associated with GAD is also difficult to control. GAD can vary from mild to severe. People with severe GAD can have intense waves of anxiety with physical symptoms (panic attacks).  SYMPTOMS The anxiety and worry associated with GAD  are difficult to control. This anxiety and worry are related to many life events and activities and also occur more days than not for 6 months or longer. People with GAD also have three or more of the following symptoms (one or more in children):  Restlessness.   Fatigue.  Difficulty concentrating.   Irritability.  Muscle tension.  Difficulty sleeping or unsatisfying sleep. DIAGNOSIS GAD is diagnosed through an assessment by your health care provider. Your health care provider will ask you questions aboutyour mood,physical symptoms, and events in your life. Your health care provider may ask you about your medical history and use of alcohol or drugs, including prescription medicines. Your health care provider may also do a physical exam and blood tests. Certain medical conditions and the use of certain substances can cause symptoms similar to those associated with GAD. Your health care provider may refer you to a mental health specialist for further evaluation. TREATMENT The following therapies are usually used to treat GAD:   Medication. Antidepressant medication usually is prescribed for long-term daily control. Antianxiety medicines may be added in severe cases, especially when panic attacks occur.   Talk therapy (psychotherapy). Certain types of talk therapy can be helpful in treating GAD by providing support, education, and guidance. A form of talk therapy called cognitive behavioral therapy can teach you healthy ways to think about and react to daily life events and activities.  Stress managementtechniques. These include yoga, meditation, and exercise and can be very helpful when they are practiced regularly. A mental health specialist can help determine which treatment is best for you. Some people see improvement with one therapy. However, other people require a combination of therapies.   This information is not intended to replace advice given to you by your health care  provider. Make sure you discuss any questions you have with your  health care provider.   Document Released: 07/27/2012 Document Revised: 04/22/2014 Document Reviewed: 07/27/2012 Elsevier Interactive Patient Education 2016 [place] for Diabetes Mellitus Carbohydrate counting is a method for keeping track of the amount of carbohydrates you eat. Eating carbohydrates naturally increases the level of sugar (glucose) in your blood, so it is important for you to know the amount that is okay for you to have in every meal. Carbohydrate counting helps keep the level of glucose in your blood within normal limits. The amount of carbohydrates allowed is different for every person. A dietitian can help you calculate the amount that is right for you. Once you know the amount of carbohydrates you can have, you can count the carbohydrates in the foods you want to eat. Carbohydrates are found in the following foods:  Grains, such as breads and cereals.  Dried beans and soy products.  Starchy vegetables, such as potatoes, peas, and corn.  Fruit and fruit juices.  Milk and yogurt.  Sweets and snack foods, such as cake, cookies, candy, chips, soft drinks, and fruit drinks. CARBOHYDRATE COUNTING There are two ways to count the carbohydrates in your food. You can use either of the methods or a combination of both. Reading the "Nutrition Facts" on [place] The "Nutrition Facts" is an area that is included on the labels of almost all packaged food and beverages in the [place]. It includes the serving size of that food or beverage and information about the nutrients in each serving of the food, including the grams (g) of carbohydrate per serving.  Decide the number of servings of this food or beverage that you will be able to eat or drink. Multiply that number of servings by the number of grams of carbohydrate that is listed on the label for that serving. The total will be  the amount of carbohydrates you will be having when you eat or drink this food or beverage. Learning Standard Serving Sizes of Food When you eat food that is not packaged or does not include "Nutrition Facts" on the label, you need to measure the servings in order to count the amount of carbohydrates.A serving of most carbohydrate-rich foods contains about 15 g of carbohydrates. The following list includes serving sizes of carbohydrate-rich foods that provide 15 g ofcarbohydrate per serving:   1 slice of bread (1 oz) or 1 six-inch tortilla.    of a hamburger bun or English muffin.  4-6 crackers.   cup unsweetened dry cereal.    cup hot cereal.   cup rice or pasta.    cup mashed potatoes or  of a large baked potato.  1 cup fresh fruit or one small piece of fruit.    cup canned or frozen fruit or fruit juice.  1 cup milk.   cup plain fat-free yogurt or yogurt sweetened with artificial sweeteners.   cup cooked dried beans or starchy vegetable, such as peas, corn, or potatoes.  Decide the number of standard-size servings that you will eat. Multiply that number of servings by 15 (the grams of carbohydrates in that serving). For example, if you eat 2 cups of strawberries, you will have eaten 2 servings and 30 g of carbohydrates (2 servings x 15 g = 30 g). For foods such as soups and casseroles, in which more than one food is mixed in, you will need to count the carbohydrates in each food that is included. EXAMPLE OF CARBOHYDRATE COUNTING Sample Dinner  3 oz chicken breast.  cup of brown rice.   cup of corn.  1 cup milk.   1 cup strawberries with sugar-free whipped topping.  Carbohydrate Calculation Step 1: Identify the foods that contain carbohydrates:   Rice.   Corn.   Milk.   Strawberries. Step 2:Calculate the number of servings eaten of each:   2 servings of rice.   1 serving of corn.   1 serving of milk.   1 serving of  strawberries. Step 3: Multiply each of those number of servings by 15 g:   2 servings of rice x 15 g = 30 g.   1 serving of corn x 15 g = 15 g.   1 serving of milk x 15 g = 15 g.   1 serving of strawberries x 15 g = 15 g. Step 4: Add together all of the amounts to find the total grams of carbohydrates eaten: 30 g + 15 g + 15 g + 15 g = 75 g.   This information is not intended to replace advice given to you by your health care provider. Make sure you discuss any questions you have with your health care provider.   Document Released: 04/01/2005 Document Revised: 04/22/2014 Document Reviewed: 02/26/2013 Elsevier Interactive Patient Education [employer].

## 2015-11-30 NOTE — Progress Notes (Signed)
Patient is here for FU Dizziness  Patient complains of feeling "fuzzy". Patient had a headache last night in her sleep and patient had sharp pain shooting down her right leg. Patient complains of right arm numbness and blurred vision.  Patient denies symptoms at this time. Patient complains of feeling "fuzzy" and "not right".  Patient has taken medication today and patient has eaten.  Patient would like her flu shot today. Patient tolerated flu injection well.  Patient denies any suicidal ideations at this time.

## 2015-12-01 ENCOUNTER — Ambulatory Visit: Payer: Medicare Other | Admitting: Physical Therapy

## 2015-12-04 ENCOUNTER — Ambulatory Visit: Payer: Medicare Other | Admitting: Physical Therapy

## 2015-12-08 ENCOUNTER — Ambulatory Visit: Payer: Medicare Other | Admitting: Physical Therapy

## 2015-12-11 DIAGNOSIS — E113412 Type 2 diabetes mellitus with severe nonproliferative diabetic retinopathy with macular edema, left eye: Secondary | ICD-10-CM | POA: Diagnosis not present

## 2015-12-13 DIAGNOSIS — I129 Hypertensive chronic kidney disease with stage 1 through stage 4 chronic kidney disease, or unspecified chronic kidney disease: Secondary | ICD-10-CM | POA: Diagnosis not present

## 2015-12-13 DIAGNOSIS — E1129 Type 2 diabetes mellitus with other diabetic kidney complication: Secondary | ICD-10-CM | POA: Diagnosis not present

## 2015-12-13 DIAGNOSIS — N184 Chronic kidney disease, stage 4 (severe): Secondary | ICD-10-CM | POA: Diagnosis not present

## 2015-12-13 DIAGNOSIS — D631 Anemia in chronic kidney disease: Secondary | ICD-10-CM | POA: Diagnosis not present

## 2015-12-13 DIAGNOSIS — I639 Cerebral infarction, unspecified: Secondary | ICD-10-CM | POA: Diagnosis not present

## 2015-12-13 DIAGNOSIS — E669 Obesity, unspecified: Secondary | ICD-10-CM | POA: Diagnosis not present

## 2015-12-13 DIAGNOSIS — I2510 Atherosclerotic heart disease of native coronary artery without angina pectoris: Secondary | ICD-10-CM | POA: Diagnosis not present

## 2015-12-13 DIAGNOSIS — N2581 Secondary hyperparathyroidism of renal origin: Secondary | ICD-10-CM | POA: Diagnosis not present

## 2015-12-14 ENCOUNTER — Ambulatory Visit (INDEPENDENT_AMBULATORY_CARE_PROVIDER_SITE_OTHER): Payer: Medicare Other | Admitting: Internal Medicine

## 2015-12-14 ENCOUNTER — Encounter: Payer: Self-pay | Admitting: Internal Medicine

## 2015-12-14 VITALS — BP 132/82 | HR 74 | Ht 70.5 in | Wt 258.0 lb

## 2015-12-14 DIAGNOSIS — I200 Unstable angina: Secondary | ICD-10-CM

## 2015-12-14 DIAGNOSIS — N183 Chronic kidney disease, stage 3 unspecified: Secondary | ICD-10-CM

## 2015-12-14 DIAGNOSIS — Z794 Long term (current) use of insulin: Secondary | ICD-10-CM | POA: Diagnosis not present

## 2015-12-14 DIAGNOSIS — E1122 Type 2 diabetes mellitus with diabetic chronic kidney disease: Secondary | ICD-10-CM | POA: Diagnosis not present

## 2015-12-14 MED ORDER — INSULIN ASPART 100 UNIT/ML FLEXPEN: PEN_INJECTOR | SUBCUTANEOUS | 5 refills | Status: DC
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
## 2015-12-14 NOTE — Patient Instructions (Addendum)
Please continue: - Toujeo 40 units daily  Change: - Novolog (10-15 min before a meal)  10-15 units before b'fast  15-20 units before lunch  25-30 units before dinner If sugars before a meal are 80-150: take the whole amount of mealtime insulin with that meal, but not the Sliding scale If sugars are 60-80, take only half of the mealtime insulin and no sliding scale. If sugars <60, do not take any insulin with that meal.  Continue: - NovoLog SSI: - 150- 165: + 1 unit  - 166- 180: + 2 units  - 181- 195: + 3 units  - 196- 210: + 4 units  - >210: + 5 units  Please return in 3 months with your sugar log.

## 2015-12-15 ENCOUNTER — Ambulatory Visit: Payer: Medicare Other | Admitting: Physical Therapy

## 2015-12-16 ENCOUNTER — Other Ambulatory Visit: Payer: Self-pay | Admitting: Cardiology

## 2015-12-17 ENCOUNTER — Encounter (HOSPITAL_COMMUNITY): Payer: Self-pay | Admitting: Emergency Medicine

## 2015-12-17 ENCOUNTER — Emergency Department (HOSPITAL_COMMUNITY): Payer: Medicare Other

## 2015-12-17 ENCOUNTER — Inpatient Hospital Stay (HOSPITAL_COMMUNITY)
Admission: EM | Admit: 2015-12-17 | Discharge: 2015-12-22 | DRG: 291 | Disposition: A | Discharge status: Home / Self Care | Payer: Medicare Other | Attending: Internal Medicine | Admitting: Internal Medicine

## 2015-12-17 DIAGNOSIS — I509 Heart failure, unspecified: Secondary | ICD-10-CM

## 2015-12-17 DIAGNOSIS — I5043 Acute on chronic combined systolic (congestive) and diastolic (congestive) heart failure: Secondary | ICD-10-CM | POA: Diagnosis present

## 2015-12-17 DIAGNOSIS — R0603 Acute respiratory distress: Secondary | ICD-10-CM

## 2015-12-17 DIAGNOSIS — J9601 Acute respiratory failure with hypoxia: Secondary | ICD-10-CM | POA: Diagnosis present

## 2015-12-17 DIAGNOSIS — J810 Acute pulmonary edema: Secondary | ICD-10-CM | POA: Diagnosis not present

## 2015-12-17 DIAGNOSIS — R0602 Shortness of breath: Secondary | ICD-10-CM | POA: Diagnosis not present

## 2015-12-17 DIAGNOSIS — I2510 Atherosclerotic heart disease of native coronary artery without angina pectoris: Secondary | ICD-10-CM | POA: Diagnosis present

## 2015-12-17 DIAGNOSIS — Z794 Long term (current) use of insulin: Secondary | ICD-10-CM

## 2015-12-17 DIAGNOSIS — N189 Chronic kidney disease, unspecified: Secondary | ICD-10-CM

## 2015-12-17 DIAGNOSIS — K219 Gastro-esophageal reflux disease without esophagitis: Secondary | ICD-10-CM | POA: Diagnosis present

## 2015-12-17 DIAGNOSIS — E785 Hyperlipidemia, unspecified: Secondary | ICD-10-CM | POA: Diagnosis present

## 2015-12-17 DIAGNOSIS — Z7982 Long term (current) use of aspirin: Secondary | ICD-10-CM

## 2015-12-17 DIAGNOSIS — E669 Obesity, unspecified: Secondary | ICD-10-CM | POA: Diagnosis present

## 2015-12-17 DIAGNOSIS — I252 Old myocardial infarction: Secondary | ICD-10-CM

## 2015-12-17 DIAGNOSIS — Z87891 Personal history of nicotine dependence: Secondary | ICD-10-CM | POA: Diagnosis not present

## 2015-12-17 DIAGNOSIS — I5023 Acute on chronic systolic (congestive) heart failure: Secondary | ICD-10-CM

## 2015-12-17 DIAGNOSIS — R0600 Dyspnea, unspecified: Secondary | ICD-10-CM | POA: Diagnosis not present

## 2015-12-17 DIAGNOSIS — F419 Anxiety disorder, unspecified: Secondary | ICD-10-CM | POA: Diagnosis present

## 2015-12-17 DIAGNOSIS — Z955 Presence of coronary angioplasty implant and graft: Secondary | ICD-10-CM

## 2015-12-17 DIAGNOSIS — N184 Chronic kidney disease, stage 4 (severe): Secondary | ICD-10-CM | POA: Diagnosis present

## 2015-12-17 DIAGNOSIS — K5900 Constipation, unspecified: Secondary | ICD-10-CM | POA: Diagnosis present

## 2015-12-17 DIAGNOSIS — Z9049 Acquired absence of other specified parts of digestive tract: Secondary | ICD-10-CM

## 2015-12-17 DIAGNOSIS — I130 Hypertensive heart and chronic kidney disease with heart failure and stage 1 through stage 4 chronic kidney disease, or unspecified chronic kidney disease: Principal | ICD-10-CM | POA: Diagnosis present

## 2015-12-17 DIAGNOSIS — I69351 Hemiplegia and hemiparesis following cerebral infarction affecting right dominant side: Secondary | ICD-10-CM

## 2015-12-17 DIAGNOSIS — M109 Gout, unspecified: Secondary | ICD-10-CM | POA: Diagnosis present

## 2015-12-17 DIAGNOSIS — Z880 Allergy status to penicillin: Secondary | ICD-10-CM

## 2015-12-17 DIAGNOSIS — J449 Chronic obstructive pulmonary disease, unspecified: Secondary | ICD-10-CM | POA: Diagnosis present

## 2015-12-17 DIAGNOSIS — I10 Essential (primary) hypertension: Secondary | ICD-10-CM | POA: Diagnosis present

## 2015-12-17 DIAGNOSIS — Z23 Encounter for immunization: Secondary | ICD-10-CM

## 2015-12-17 DIAGNOSIS — Z6836 Body mass index (BMI) 36.0-36.9, adult: Secondary | ICD-10-CM

## 2015-12-17 DIAGNOSIS — Z79899 Other long term (current) drug therapy: Secondary | ICD-10-CM

## 2015-12-17 DIAGNOSIS — D638 Anemia in other chronic diseases classified elsewhere: Secondary | ICD-10-CM | POA: Diagnosis present

## 2015-12-17 DIAGNOSIS — N179 Acute kidney failure, unspecified: Secondary | ICD-10-CM | POA: Diagnosis not present

## 2015-12-17 DIAGNOSIS — R069 Unspecified abnormalities of breathing: Secondary | ICD-10-CM | POA: Diagnosis not present

## 2015-12-17 DIAGNOSIS — E1122 Type 2 diabetes mellitus with diabetic chronic kidney disease: Secondary | ICD-10-CM | POA: Diagnosis present

## 2015-12-17 DIAGNOSIS — Z951 Presence of aortocoronary bypass graft: Secondary | ICD-10-CM

## 2015-12-17 DIAGNOSIS — E039 Hypothyroidism, unspecified: Secondary | ICD-10-CM | POA: Diagnosis present

## 2015-12-17 LAB — CBC
HEMATOCRIT: 35.6 % — AB (ref 36.0–46.0)
HEMOGLOBIN: 11.4 g/dL — AB (ref 12.0–15.0)
MCH: 30.6 pg (ref 26.0–34.0)
MCHC: 32 g/dL (ref 30.0–36.0)
MCV: 95.4 fL (ref 78.0–100.0)
Platelets: 235 10*3/uL (ref 150–400)
RBC: 3.73 MIL/uL — AB (ref 3.87–5.11)
RDW: 15.4 % (ref 11.5–15.5)
WBC: 13.3 10*3/uL — ABNORMAL HIGH (ref 4.0–10.5)

## 2015-12-17 LAB — BASIC METABOLIC PANEL
Anion gap: 11 (ref 5–15)
BUN: 32 mg/dL — AB (ref 6–20)
CHLORIDE: 111 mmol/L (ref 101–111)
CO2: 17 mmol/L — ABNORMAL LOW (ref 22–32)
Calcium: 9.7 mg/dL (ref 8.9–10.3)
Creatinine, Ser: 2.9 mg/dL — ABNORMAL HIGH (ref 0.44–1.00)
GFR calc non Af Amer: 16 mL/min — ABNORMAL LOW (ref >60)
GFR, EST AFRICAN AMERICAN: 19 mL/min — AB (ref >60)
Glucose, Bld: 261 mg/dL — ABNORMAL HIGH (ref 65–99)
POTASSIUM: 4 mmol/L (ref 3.5–5.1)
SODIUM: 139 mmol/L (ref 135–145)

## 2015-12-17 LAB — I-STAT TROPONIN, ED: TROPONIN I, POC: 0.02 ng/mL (ref 0.00–0.08)

## 2015-12-17 LAB — PROTIME-INR
INR: 0.91
PROTHROMBIN TIME: 12.2 s (ref 11.4–15.2)

## 2015-12-17 LAB — BRAIN NATRIURETIC PEPTIDE: B Natriuretic Peptide: 1144.8 pg/mL — ABNORMAL HIGH (ref 0.0–100.0)

## 2015-12-17 MED ORDER — ETOMIDATE 2 MG/ML IV SOLN: 20.0000 mg | Freq: Once | INTRAVENOUS | Status: DC

## 2015-12-17 MED ORDER — ALBUTEROL (5 MG/ML) CONTINUOUS INHALATION SOLN
10.0000 mg/h | INHALATION_SOLUTION | RESPIRATORY_TRACT | Status: DC
  Administered 2015-12-17: 10 mg/h via RESPIRATORY_TRACT
  Filled 2015-12-17: qty 20

## 2015-12-17 MED ORDER — ASPIRIN 81 MG PO CHEW
324.0000 mg | CHEWABLE_TABLET | Freq: Once | ORAL | Status: AC
  Administered 2015-12-18: 324 mg via ORAL
  Filled 2015-12-17: qty 4

## 2015-12-17 MED ORDER — ROCURONIUM BROMIDE 50 MG/5ML IV SOLN
1.0000 mg/kg | Freq: Once | INTRAVENOUS | Status: DC
  Filled 2015-12-17: qty 11.7

## 2015-12-17 MED ORDER — NITROGLYCERIN IN D5W 200-5 MCG/ML-% IV SOLN
5.0000 ug/min | INTRAVENOUS | Status: DC
  Administered 2015-12-17: 5 ug/min via INTRAVENOUS
  Filled 2015-12-17: qty 250

## 2015-12-17 NOTE — ED Notes (Signed)
Pt arrives via EMS from home for [facility] that began while she was bearing down to have a bowel movement. Called EMS and was alert and oriented with expiratory wheezing, but during transport became combative with rales in all fields. Unable to keep CPAP on. Placed on Bipap upon arrival and planned for intubation d/t distress, but distress resolved with a few minutes of BiPap. Hold off on intubation for now.

## 2015-12-17 NOTE — ED Provider Notes (Signed)
[facility] DEPT Provider Note   CSN: [id number] Arrival date & time: 12/17/15  2233     History   Chief Complaint Chief Complaint  Patient presents with  . Respiratory Distress    HPI Brooke Stafford is a 61 y.o. female.  HPI Patient presents to the emergency room with complaints of acute onset of shortness of breath. Patient has history of congestive heart failure as well as several other medical problems. She was in the bathroom this evening. She was having trouble with constipation and was trying to force a bowel movement. Patient had acute onset of shortness of breath. She called EMS. They found her wheezing and respiratory distress. They attempted BiPAP but the patient became worse during transport. She became combative and was ripping off the mask. Initially we were preparing to intubate the patient but after starting her on BiPAP she rapidly improved. The patient states that she is feeling better than she was earlier. She'll having shortness of breath. She denies any chest pain. She has noticed some leg swelling recently. No Fevers chills or cough. Past Medical History:  Diagnosis Date  . Anemia   . Anginal pain ([place])   . Anxiety   . Arthritis    "stiff fingers and knees" (08/04/2013), (12/12/2014)  . Asthma   . CAD (coronary artery disease) 2002; 2015   CABG x 6 2002, cath 2011- med Rx stent DES VG-Diag  . CAD (coronary artery disease) of artery bypass graft; DES to VG-Diag 09/28/13 11/09/2013  . Cataract   . CHF (congestive heart failure) ([place])    "in 2002" (11/26/2012)  . Chronic bronchitis ([place])    "q year; in the winter"   . Chronic renal insufficiency, stage II (mild)    followed  by [facility]  . Coronary artery disease 2002   CABG x 6. Cath 5/11- med Rx  . GERD (gastroesophageal reflux disease)   . Gout    "right big toe"  . Headache    "~ q week" (08/04/2013); "~ twice/month" (12/12/2014)  . History of blood transfusion 2002   "when I had OHS"  .  Hyperlipidemia   . Hypertension   . Hypothyroid    treated  . Migraines    "couple times/year" (08/04/2013), (12/12/2014)  . Myocardial infarction ([place]) 2000; 2002; 2011  . Obesity (BMI 35.0-39.9 without comorbidity) ([place])   . Peripheral vascular disease ([place]) 12/12   LSFA PTA  . Pneumonia    "3 times I think" (12/12/2014)  . Type II diabetes mellitus [facility])     Patient Active Problem List   Diagnosis Date Noted  . Adjustment disorder with anxious mood 11/02/2015  . Posterior circulation stroke ([place]) 11/02/2015  . Abnormality of gait 10/23/2015  . Depression 09/28/2015  . Type 2 diabetes mellitus with stage 3 chronic kidney disease, with long-term current use of insulin ([place]) 09/13/2015  . Diabetic retinopathy ([place]) 09/13/2015  . HTN (hypertension), benign 08/24/2015  . Grief reaction 08/24/2015  . Pain in the chest   . Acute kidney injury superimposed on chronic kidney disease ([place]) 07/25/2015  . Creatinine elevation 07/25/2015  . Chest pain at rest 07/25/2015  . Chronic combined systolic and diastolic heart failure ([place]) 07/25/2015  . Rash of hands 02/23/2015  . Essential hypertension, benign 02/23/2015  . Hypertension   . Angina pectoris ([place]) 12/12/2014  . Abnormal nuclear stress test   . Acute combined systolic and diastolic heart failure ([place]) 12/02/2014  . Seasonal allergies 08/11/2014  . Gout of  big toe 08/11/2014  . Encounter for screening mammogram for breast cancer 05/16/2014  . Screening for colon cancer 05/16/2014  . Diabetic neuropathy, type II diabetes mellitus ([place]) 01/27/2014  . Atopic eczema 01/27/2014  . Precordial pain, atypical, negative MI, Musculature Skeletal pain  11/08/2013  . CKD (chronic kidney disease), stage III 11/08/2013  . Unstable angina ([place]) 09/28/2013  . Ischemic cardiomyopathy- new drop in EF 08/30/2013  . Chest pain 08/04/2013  . CAD -S/P PCI June 2015 and 12/14/14 02/01/2013  . Hypothyroidism 02/01/2013  . PVD, LSFA PTA 12/12  04/06/2011  . HTN (hypertension), malignant, patent renal arteries 04/06/2011  . Hx of CABG x 6 2002 04/06/2011  . Dyslipidemia 04/06/2011    Past Surgical History:  Procedure Laterality Date  . ABDOMINAL AORTAGRAM N/A 04/05/2011   Procedure: ABDOMINAL AORTAGRAM;  Surgeon: Lorretta Harp, MD;  Location: [facility] CATH LAB;  Service: Cardiovascular;  Laterality: N/A;  . APPENDECTOMY  1980  . BREAST CYST EXCISION Right 1970's  . CARDIAC CATHETERIZATION  2002  . CARDIAC CATHETERIZATION N/A 12/12/2014   Procedure: Left Heart Cath and Cors/Grafts Angiography;  Surgeon: Peter M Martinique, MD;  Location: [facility] CV LAB;  Service: Cardiovascular;  Laterality: N/A;  . CARDIAC CATHETERIZATION N/A 12/12/2014   Procedure: Coronary Stent Intervention;  Surgeon: Peter M Martinique, MD;  Location: [facility] CV LAB;  Service: Cardiovascular;  Laterality: N/A;  . [place]; 1980  . CHOLECYSTECTOMY  1982  . CORONARY ANGIOPLASTY WITH STENT PLACEMENT  2004; 2012   "I have 2 stents" (08/04/2013)  . CORONARY ANGIOPLASTY WITH STENT PLACEMENT  09/28/13   PTCA/ DES Xience stent to VG-Diag   . CORONARY ARTERY BYPASS GRAFT  11/20/2000   x6 LIMA to distal LAD, svg to first diag, svg to ramus intermediate branch and swquential SVG to cir marginal branch, SVG to posterior descending coronary and sequential SVG to first right posterolateral branch  . LEFT HEART CATHETERIZATION WITH CORONARY/GRAFT ANGIOGRAM N/A 09/28/2013   Procedure: LEFT HEART CATHETERIZATION WITH Beatrix Fetters;  Surgeon: Peter M Martinique, MD;  Location: [facility] CATH LAB;  Service: Cardiovascular;  Laterality: N/A;  . LOWER EXTREMITY ANGIOGRAM  12/01/2012   Procedure: LOWER EXTREMITY ANGIOGRAM;  Surgeon: Lorretta Harp, MD;  Location: [facility] CATH LAB;  Service: Cardiovascular;;  . NM MYOCAR PERF WALL MOTION  08/27/2004   negative  . PERCUTANEOUS STENT INTERVENTION Left 12/01/2012   Procedure: PERCUTANEOUS STENT INTERVENTION;  Surgeon: Lorretta Harp, MD;  Location: [facility] CATH LAB;  Service: Cardiovascular;  Laterality: Left;  Left SFA  . PERIPHERAL ARTERIAL STENT GRAFT Left    SFA/notes 04/07/2011 (11/30/2012)  . RENAL ANGIOGRAM N/A 04/05/2011   Procedure: RENAL ANGIOGRAM;  Surgeon: Lorretta Harp, MD;  Location: [facility] CATH LAB;  Service: Cardiovascular;  Laterality: N/A;  . TUBAL LIGATION  1980    OB History    No data available       Home Medications    Prior to Admission medications   Medication Sig Start Date End Date Taking? Authorizing Provider  acetaminophen-codeine (TYLENOL #3) 300-30 MG tablet Take 1 tablet by mouth every 4 (four) hours as needed for moderate pain. 08/24/15   Tresa Garter, MD  albuterol (PROVENTIL HFA;VENTOLIN HFA) 108 (90 Base) MCG/ACT inhaler Inhale 2 puffs into the lungs every 6 (six) hours as needed for wheezing or shortness of breath. 09/28/15   Tresa Garter, MD  allopurinol (ZYLOPRIM) 300 MG tablet Take 1 tablet (300 mg total) by mouth  daily. 07/28/15   Ripudeep Krystal Eaton, MD  amLODipine (NORVASC) 10 MG tablet take 1 tablet by mouth once daily 11/20/15   Sanda Klein, MD  aspirin 81 MG chewable tablet Chew 1 tablet (81 mg total) by mouth daily. 02/24/15   Mihai Croitoru, MD  atorvastatin (LIPITOR) 40 MG tablet Take 1 tablet (40 mg total) by mouth daily. 07/28/15   Ripudeep Krystal Eaton, MD  clonazePAM (KLONOPIN) 0.5 MG tablet Take 1 tablet (0.5 mg total) by mouth 2 (two) times daily as needed for anxiety. 11/30/15 12/30/15  Tresa Garter, MD  cloNIDine (CATAPRES) 0.3 MG tablet Take 1 tablet (0.3 mg total) by mouth 2 (two) times daily. 11/02/15   Tresa Garter, MD  clopidogrel (PLAVIX) 75 MG tablet Take 1 tablet (75 mg total) by mouth daily. 09/25/15   Mihai Croitoru, MD  ezetimibe (ZETIA) 10 MG tablet Take 1 tablet (10 mg total) by mouth daily. 07/28/15   Ripudeep Krystal Eaton, MD  fluticasone (FLONASE) 50 MCG/ACT nasal spray Place 2 sprays into both nostrils daily as needed for allergies or rhinitis.  09/28/15   Tresa Garter, MD  furosemide (LASIX) 40 MG tablet Take 1 tablet (40 mg total) by mouth daily. 07/28/15   Ripudeep Krystal Eaton, MD  gabapentin (NEURONTIN) 400 MG capsule take 1 capsule by mouth three times a day 02/16/15   Tresa Garter, MD  insulin aspart (NOVOLOG FLEXPEN) 100 UNIT/ML FlexPen Inject 10-30 units into the skin 3 times daily, plus sliding scale as instructed. 12/14/15   Philemon Kingdom, MD  Insulin Glargine (TOUJEO SOLOSTAR) 300 UNIT/ML SOPN Inject 40 Units into the skin at bedtime. 09/13/15   Philemon Kingdom, MD  Insulin Pen Needle 31G X 5 MM MISC Use to inject insulin 4 times daily as instructed. 02/16/15   Philemon Kingdom, MD  Insulin Syringes, Disposable, (B-D INSULIN SYRINGE 1CC) U-100 1 ML MISC Use to inject insulin 4 times daily as instructed. 02/22/14   Philemon Kingdom, MD  isosorbide mononitrate (IMDUR) 120 MG 24 hr tablet Take 1 tablet (120 mg total) by mouth daily. 07/28/15   Ripudeep Krystal Eaton, MD  levothyroxine (SYNTHROID, LEVOTHROID) 25 MCG tablet take 1 tablet by mouth every morning BEFORE BREAKFAST 11/24/15   Tresa Garter, MD  loratadine (CLARITIN) 10 MG tablet Take 1 tablet (10 mg total) by mouth daily as needed for allergies. 12/15/14   Erlene Quan, PA-C  magnesium citrate SOLN Take 296 mLs (1 Bottle total) by mouth as needed for severe constipation. 07/28/15   Ripudeep Krystal Eaton, MD  Menthol-Camphor (TIGER BALM ARTHRITIS RUB) 11-11 % CREA Apply 1 application topically 2 (two) times daily.    Historical Provider, MD  metoprolol succinate (TOPROL-XL) 50 MG 24 hr tablet Take 3 tablets (150 mg total) by mouth daily. Take with or immediately following a meal. 07/28/15   Ripudeep Krystal Eaton, MD  nitroGLYCERIN (NITROSTAT) 0.4 MG SL tablet Place 1 tablet (0.4 mg total) under the tongue every 5 (five) minutes as needed. For chest 11/10/14   Tresa Garter, MD  ranitidine (ZANTAC 75) 75 MG tablet Take 1 tablet (75 mg total) by mouth 2 (two) times daily. 07/28/15    Ripudeep Krystal Eaton, MD  traZODone (DESYREL) 50 MG tablet Take 1 tablet (50 mg total) by mouth at bedtime. 09/28/15   Tresa Garter, MD  triamcinolone ointment (KENALOG) 0.5 % Apply 1 application topically 2 (two) times daily. 08/24/15   Tresa Garter, MD    Family History  Family History  Problem Relation Age of Onset  . Diabetes Mother   . Hypertension Mother   . Stroke Mother   . Hypertension Father   . Hypertension Brother   . Hypertension Sister   . Diabetes Sister   . Hyperlipidemia Sister     Social History Social History  Substance Use Topics  . Smoking status: Former Smoker    Packs/day: 0.00    Years: 25.00    Types: Cigarettes    Quit date: 04/04/2000  . Smokeless tobacco: Never Used  . Alcohol use No     Comment: 12/12/2014  "have a glass of red wine on my birthday q yr; that's it"     Allergies   Digoxin and related; Hydralazine; Lisinopril; Penicillins cross reactors; and Adhesive [tape]   Review of Systems Review of Systems  All other systems reviewed and are negative.    Physical Exam Updated Vital Signs BP 156/70   Pulse 94   Temp 98.4 F (36.9 C) (Axillary)   Resp 18   LMP 10/13/2014 (Exact Date)   SpO2 100%   Physical Exam  Constitutional: She appears distressed.  Overweight  HENT:  Head: Normocephalic and atraumatic.  Right Ear: External ear normal.  Left Ear: External ear normal.  Eyes: Conjunctivae are normal. Right eye exhibits no discharge. Left eye exhibits no discharge. No scleral icterus.  Neck: Neck supple. No tracheal deviation present.  Cardiovascular: Normal rate, regular rhythm and intact distal pulses.   Pulmonary/Chest: No stridor. Tachypnea noted. She is in respiratory distress. She has wheezes. She has rales.  Abdominal: Soft. Bowel sounds are normal. She exhibits no distension. There is no tenderness. There is no rebound and no guarding.  Musculoskeletal: She exhibits edema. She exhibits no tenderness.    Neurological: She is alert. She has normal strength. No cranial nerve deficit (no facial droop, extraocular movements intact, no slurred speech) or sensory deficit. She exhibits normal muscle tone. She displays no seizure activity. Coordination normal.  Skin: Skin is warm and dry. No rash noted. She is not diaphoretic.  Psychiatric: She has a normal mood and affect.  Nursing note and vitals reviewed.    ED Treatments / Results  Labs (all labs ordered are listed, but only abnormal results are displayed) Labs Reviewed  CBC - Abnormal; Notable for the following:       Result Value   WBC 13.3 (*)    RBC 3.73 (*)    Hemoglobin 11.4 (*)    HCT 35.6 (*)    All other components within normal limits  BASIC METABOLIC PANEL - Abnormal; Notable for the following:    CO2 17 (*)    Glucose, Bld 261 (*)    BUN 32 (*)    Creatinine, Ser 2.90 (*)    GFR calc non Af Amer 16 (*)    GFR calc Af Amer 19 (*)    All other components within normal limits  BRAIN NATRIURETIC PEPTIDE - Abnormal; Notable for the following:    B Natriuretic Peptide 1,144.8 (*)    All other components within normal limits  PROTIME-INR  I-STAT TROPOININ, ED  I-STAT ARTERIAL BLOOD GAS, ED    EKG  EKG Interpretation  Date/Time:  Sunday December 17 2015 22:39:30 EDT Ventricular Rate:  114 PR Interval:    QRS Duration: 100 QT Interval:  345 QTC Calculation: 476 R Axis:   67 Text Interpretation:  Sinus tachycardia Probable left atrial enlargement Abnormal T, consider ischemia, diffuse leads , new  since last tracing Baseline wander in lead(s) III V4 V6 Since last tracing rate faster Confirmed by Aerionna Moravek  MD-J, [facility] [phone]) on 12/17/2015 10:52:12 PM       Radiology Dg Chest Portable 1 View  Result Date: 12/17/2015 CLINICAL DATA:  Acute onset of shortness of breath. Initial encounter. EXAM: PORTABLE CHEST 1 VIEW COMPARISON:  Chest radiograph performed 07/25/2015 FINDINGS: The lungs are well-aerated. Mild bibasilar  opacities may reflect pneumonia or pulmonary edema. Mild vascular congestion is noted. No pleural effusion or pneumothorax is seen. The cardiomediastinal silhouette is mildly enlarged. The patient is status post median sternotomy, with evidence of prior CABG. No acute osseous abnormalities are seen. IMPRESSION: Mild bibasilar opacities may reflect pneumonia or pulmonary edema. Mild vascular congestion and mild cardiomegaly noted. Electronically Signed   By: Garald Balding M.D.   On: 12/17/2015 23:20    Procedures .Critical Care Performed by: Dorie Rank Authorized by: Dorie Rank   Critical care provider statement:    Critical care time (minutes):  45   Critical care was necessary to treat or prevent imminent or life-threatening deterioration of the following conditions:  Respiratory failure   Critical care was time spent personally by me on the following activities:  Discussions with consultants, evaluation of patient's response to treatment, examination of patient, ordering and performing treatments and interventions, ordering and review of laboratory studies, ordering and review of radiographic studies, pulse oximetry, re-evaluation of patient's condition, obtaining history from patient or surrogate and review of old charts   (including critical care time)  Medications Ordered in ED Medications  rocuronium (ZEMURON) injection 117 mg (0 mg Intravenous Hold 12/17/15 2247)  etomidate (AMIDATE) injection 20 mg (0 mg Intravenous Hold 12/17/15 2247)  aspirin chewable tablet 324 mg (not administered)  nitroGLYCERIN 50 mg in dextrose 5 % 250 mL (0.2 mg/mL) infusion (5 mcg/min Intravenous New Bag/Given 12/17/15 2259)  albuterol (PROVENTIL,VENTOLIN) solution continuous neb (10 mg/hr Nebulization New Bag/Given 12/17/15 2303)  furosemide (LASIX) injection 80 mg (not administered)     Initial Impression / Assessment and Plan / ED Course  I have reviewed the triage vital signs and the nursing  notes.  Pertinent labs & imaging results that were available during my care of the patient were reviewed by me and considered in my medical decision making (see chart for details).  Clinical Course  Comment By Time  The patient has improved significantly to treatment in the emergency room. She is breathing comfortably now on the BiPAP. Her blood pressure Dorie Rank, MD 09/04 0006  Is stable on a nitroglycerin drip. We can likely DC that soon. I ordered a dose of Lasix. Dorie Rank, MD 09/04 0006    Patient presented to the emergency room in acute respiratory distress. She was combative.  Patient was started on BiPAP as we prepared for endotracheal intubation. As her getting the medications ready the patient clearly was improving.  Oxygen saturations remaining in the high 90s. Laboratory test is consistent with an acute exacerbation of congestive heart failure. She does have worsening renal insufficiency. Patient denies any chest pain.  I will consult the medical service for admission and further treatment.  Final Clinical Impressions(s) / ED Diagnoses   Final diagnoses:  Acute pulmonary edema ([place])  Respiratory distress      Dorie Rank, MD 12/18/15 [phone]

## 2015-12-18 ENCOUNTER — Telehealth: Payer: Self-pay | Admitting: Physician Assistant

## 2015-12-18 ENCOUNTER — Inpatient Hospital Stay (HOSPITAL_COMMUNITY): Payer: Medicare Other

## 2015-12-18 DIAGNOSIS — R0602 Shortness of breath: Secondary | ICD-10-CM | POA: Diagnosis not present

## 2015-12-18 DIAGNOSIS — M109 Gout, unspecified: Secondary | ICD-10-CM | POA: Diagnosis present

## 2015-12-18 DIAGNOSIS — M1000 Idiopathic gout, unspecified site: Secondary | ICD-10-CM | POA: Diagnosis not present

## 2015-12-18 DIAGNOSIS — Z79899 Other long term (current) drug therapy: Secondary | ICD-10-CM | POA: Diagnosis not present

## 2015-12-18 DIAGNOSIS — N189 Chronic kidney disease, unspecified: Secondary | ICD-10-CM | POA: Diagnosis not present

## 2015-12-18 DIAGNOSIS — I69351 Hemiplegia and hemiparesis following cerebral infarction affecting right dominant side: Secondary | ICD-10-CM | POA: Diagnosis not present

## 2015-12-18 DIAGNOSIS — I10 Essential (primary) hypertension: Secondary | ICD-10-CM | POA: Diagnosis not present

## 2015-12-18 DIAGNOSIS — F419 Anxiety disorder, unspecified: Secondary | ICD-10-CM | POA: Diagnosis present

## 2015-12-18 DIAGNOSIS — Z794 Long term (current) use of insulin: Secondary | ICD-10-CM | POA: Diagnosis not present

## 2015-12-18 DIAGNOSIS — K5900 Constipation, unspecified: Secondary | ICD-10-CM | POA: Diagnosis not present

## 2015-12-18 DIAGNOSIS — E039 Hypothyroidism, unspecified: Secondary | ICD-10-CM | POA: Diagnosis present

## 2015-12-18 DIAGNOSIS — J449 Chronic obstructive pulmonary disease, unspecified: Secondary | ICD-10-CM | POA: Diagnosis present

## 2015-12-18 DIAGNOSIS — E1122 Type 2 diabetes mellitus with diabetic chronic kidney disease: Secondary | ICD-10-CM | POA: Diagnosis present

## 2015-12-18 DIAGNOSIS — R0600 Dyspnea, unspecified: Secondary | ICD-10-CM | POA: Diagnosis not present

## 2015-12-18 DIAGNOSIS — I5023 Acute on chronic systolic (congestive) heart failure: Secondary | ICD-10-CM | POA: Diagnosis not present

## 2015-12-18 DIAGNOSIS — E1129 Type 2 diabetes mellitus with other diabetic kidney complication: Secondary | ICD-10-CM | POA: Diagnosis not present

## 2015-12-18 DIAGNOSIS — I5043 Acute on chronic combined systolic (congestive) and diastolic (congestive) heart failure: Secondary | ICD-10-CM | POA: Diagnosis present

## 2015-12-18 DIAGNOSIS — N183 Chronic kidney disease, stage 3 (moderate): Secondary | ICD-10-CM | POA: Diagnosis not present

## 2015-12-18 DIAGNOSIS — I5033 Acute on chronic diastolic (congestive) heart failure: Secondary | ICD-10-CM | POA: Diagnosis not present

## 2015-12-18 DIAGNOSIS — Z23 Encounter for immunization: Secondary | ICD-10-CM | POA: Diagnosis not present

## 2015-12-18 DIAGNOSIS — J9601 Acute respiratory failure with hypoxia: Secondary | ICD-10-CM | POA: Diagnosis present

## 2015-12-18 DIAGNOSIS — N179 Acute kidney failure, unspecified: Secondary | ICD-10-CM | POA: Diagnosis not present

## 2015-12-18 DIAGNOSIS — D638 Anemia in other chronic diseases classified elsewhere: Secondary | ICD-10-CM | POA: Diagnosis present

## 2015-12-18 DIAGNOSIS — I2510 Atherosclerotic heart disease of native coronary artery without angina pectoris: Secondary | ICD-10-CM | POA: Diagnosis not present

## 2015-12-18 DIAGNOSIS — I509 Heart failure, unspecified: Secondary | ICD-10-CM

## 2015-12-18 DIAGNOSIS — Z9049 Acquired absence of other specified parts of digestive tract: Secondary | ICD-10-CM | POA: Diagnosis not present

## 2015-12-18 DIAGNOSIS — E785 Hyperlipidemia, unspecified: Secondary | ICD-10-CM | POA: Diagnosis present

## 2015-12-18 DIAGNOSIS — E669 Obesity, unspecified: Secondary | ICD-10-CM | POA: Diagnosis not present

## 2015-12-18 DIAGNOSIS — K219 Gastro-esophageal reflux disease without esophagitis: Secondary | ICD-10-CM | POA: Diagnosis present

## 2015-12-18 DIAGNOSIS — I130 Hypertensive heart and chronic kidney disease with heart failure and stage 1 through stage 4 chronic kidney disease, or unspecified chronic kidney disease: Secondary | ICD-10-CM | POA: Diagnosis not present

## 2015-12-18 DIAGNOSIS — N186 End stage renal disease: Secondary | ICD-10-CM

## 2015-12-18 DIAGNOSIS — N184 Chronic kidney disease, stage 4 (severe): Secondary | ICD-10-CM | POA: Diagnosis not present

## 2015-12-18 DIAGNOSIS — I5021 Acute systolic (congestive) heart failure: Secondary | ICD-10-CM | POA: Diagnosis not present

## 2015-12-18 DIAGNOSIS — I132 Hypertensive heart and chronic kidney disease with heart failure and with stage 5 chronic kidney disease, or end stage renal disease: Secondary | ICD-10-CM | POA: Insufficient documentation

## 2015-12-18 DIAGNOSIS — Z6836 Body mass index (BMI) 36.0-36.9, adult: Secondary | ICD-10-CM | POA: Diagnosis not present

## 2015-12-18 DIAGNOSIS — I252 Old myocardial infarction: Secondary | ICD-10-CM | POA: Diagnosis not present

## 2015-12-18 DIAGNOSIS — Z7982 Long term (current) use of aspirin: Secondary | ICD-10-CM | POA: Diagnosis not present

## 2015-12-18 LAB — CBC WITH DIFFERENTIAL/PLATELET
BASOS ABS: 0 10*3/uL (ref 0.0–0.1)
BASOS PCT: 0 %
EOS ABS: 0.1 10*3/uL (ref 0.0–0.7)
EOS PCT: 1 %
HCT: 30.9 % — ABNORMAL LOW (ref 36.0–46.0)
Hemoglobin: 9.6 g/dL — ABNORMAL LOW (ref 12.0–15.0)
LYMPHS ABS: 0.5 10*3/uL — AB (ref 0.7–4.0)
Lymphocytes Relative: 6 %
MCH: 29.4 pg (ref 26.0–34.0)
MCHC: 31.1 g/dL (ref 30.0–36.0)
MCV: 94.5 fL (ref 78.0–100.0)
Monocytes Absolute: 0.5 10*3/uL (ref 0.1–1.0)
Monocytes Relative: 5 %
Neutro Abs: 8.5 10*3/uL — ABNORMAL HIGH (ref 1.7–7.7)
Neutrophils Relative %: 88 %
PLATELETS: 199 10*3/uL (ref 150–400)
RBC: 3.27 MIL/uL — AB (ref 3.87–5.11)
RDW: 15.5 % (ref 11.5–15.5)
WBC: 9.6 10*3/uL (ref 4.0–10.5)

## 2015-12-18 LAB — GLUCOSE, CAPILLARY
GLUCOSE-CAPILLARY: 238 mg/dL — AB (ref 65–99)
GLUCOSE-CAPILLARY: 231 mg/dL — AB (ref 65–99)
GLUCOSE-CAPILLARY: 297 mg/dL — AB (ref 65–99)
Glucose-Capillary: 199 mg/dL — ABNORMAL HIGH (ref 65–99)
Glucose-Capillary: 233 mg/dL — ABNORMAL HIGH (ref 65–99)

## 2015-12-18 LAB — I-STAT ARTERIAL BLOOD GAS, ED
Acid-base deficit: 1 mmol/L (ref 0.0–2.0)
Bicarbonate: 24.4 mmol/L (ref 20.0–28.0)
O2 SAT: 98 %
TCO2: 26 mmol/L (ref 0–100)
pCO2 arterial: 44.2 mmHg (ref 32.0–48.0)
pH, Arterial: 7.348 — ABNORMAL LOW (ref 7.350–7.450)
pO2, Arterial: 115 mmHg — ABNORMAL HIGH (ref 83.0–108.0)

## 2015-12-18 MED ORDER — ISOSORBIDE MONONITRATE ER 60 MG PO TB24
120.0000 mg | ORAL_TABLET | Freq: Every day | ORAL | Status: DC
  Administered 2015-12-18 – 2015-12-22 (×5): 120 mg via ORAL
  Filled 2015-12-18 (×6): qty 2

## 2015-12-18 MED ORDER — MAGNESIUM CITRATE PO SOLN
1.0000 | Freq: Every day | ORAL | Status: DC | PRN
  Administered 2015-12-18: 1 via ORAL
  Filled 2015-12-18: qty 296

## 2015-12-18 MED ORDER — EZETIMIBE 10 MG PO TABS
10.0000 mg | ORAL_TABLET | Freq: Every day | ORAL | Status: DC
  Administered 2015-12-18 – 2015-12-22 (×5): 10 mg via ORAL
  Filled 2015-12-18 (×5): qty 1

## 2015-12-18 MED ORDER — INSULIN ASPART 100 UNIT/ML ~~LOC~~ SOLN
0.0000 [IU] | Freq: Three times a day (TID) | SUBCUTANEOUS | Status: DC
  Administered 2015-12-18: 3 [IU] via SUBCUTANEOUS
  Administered 2015-12-18: 2 [IU] via SUBCUTANEOUS
  Administered 2015-12-18: 5 [IU] via SUBCUTANEOUS
  Administered 2015-12-19: 1 [IU] via SUBCUTANEOUS
  Administered 2015-12-19: 5 [IU] via SUBCUTANEOUS
  Administered 2015-12-20: 3 [IU] via SUBCUTANEOUS
  Administered 2015-12-20: 2 [IU] via SUBCUTANEOUS
  Administered 2015-12-20: 3 [IU] via SUBCUTANEOUS
  Administered 2015-12-21 – 2015-12-22 (×5): 2 [IU] via SUBCUTANEOUS

## 2015-12-18 MED ORDER — INSULIN GLARGINE 100 UNIT/ML ~~LOC~~ SOLN
20.0000 [IU] | Freq: Every day | SUBCUTANEOUS | Status: DC
Start: 2015-12-18 — End: 2015-12-18
  Filled 2015-12-18: qty 0.2

## 2015-12-18 MED ORDER — NITROGLYCERIN 2 % TD OINT
1.0000 [in_us] | TOPICAL_OINTMENT | Freq: Once | TRANSDERMAL | Status: AC
  Administered 2015-12-18: 1 [in_us] via TOPICAL
  Filled 2015-12-18: qty 1

## 2015-12-18 MED ORDER — SODIUM CHLORIDE 0.9% FLUSH
3.0000 mL | Freq: Two times a day (BID) | INTRAVENOUS | Status: DC
  Administered 2015-12-18 – 2015-12-19 (×4): 3 mL via INTRAVENOUS

## 2015-12-18 MED ORDER — ONDANSETRON HCL 4 MG/2ML IJ SOLN: 4.0000 mg | Freq: Four times a day (QID) | INTRAMUSCULAR | Status: DC | PRN

## 2015-12-18 MED ORDER — SODIUM CHLORIDE 0.9% FLUSH: 3.0000 mL | INTRAVENOUS | Status: DC | PRN

## 2015-12-18 MED ORDER — FAMOTIDINE 20 MG PO TABS
20.0000 mg | ORAL_TABLET | Freq: Two times a day (BID) | ORAL | Status: DC
  Administered 2015-12-18 – 2015-12-22 (×9): 20 mg via ORAL
  Filled 2015-12-18 (×9): qty 1

## 2015-12-18 MED ORDER — ASPIRIN 81 MG PO CHEW
81.0000 mg | CHEWABLE_TABLET | Freq: Every day | ORAL | Status: DC
  Administered 2015-12-18 – 2015-12-22 (×5): 81 mg via ORAL
  Filled 2015-12-18 (×5): qty 1

## 2015-12-18 MED ORDER — AMLODIPINE BESYLATE 10 MG PO TABS
10.0000 mg | ORAL_TABLET | Freq: Every day | ORAL | Status: DC
  Administered 2015-12-18 – 2015-12-22 (×5): 10 mg via ORAL
  Filled 2015-12-18 (×3): qty 1
  Filled 2015-12-18: qty 2
  Filled 2015-12-18: qty 1

## 2015-12-18 MED ORDER — FUROSEMIDE 10 MG/ML IJ SOLN
40.0000 mg | Freq: Two times a day (BID) | INTRAMUSCULAR | Status: DC
Start: 2015-12-18 — End: 2015-12-20
  Administered 2015-12-18 – 2015-12-19 (×3): 40 mg via INTRAVENOUS
  Filled 2015-12-18 (×6): qty 4

## 2015-12-18 MED ORDER — NITROGLYCERIN 0.4 MG SL SUBL: 0.4000 mg | SUBLINGUAL_TABLET | SUBLINGUAL | Status: DC | PRN

## 2015-12-18 MED ORDER — SODIUM CHLORIDE 0.9 % IV SOLN: 250.0000 mL | INTRAVENOUS | Status: DC | PRN

## 2015-12-18 MED ORDER — ACETAMINOPHEN-CODEINE #3 300-30 MG PO TABS
1.0000 | ORAL_TABLET | ORAL | Status: DC | PRN
  Administered 2015-12-20 – 2015-12-21 (×3): 1 via ORAL
  Filled 2015-12-18 (×3): qty 1

## 2015-12-18 MED ORDER — FLUTICASONE PROPIONATE 50 MCG/ACT NA SUSP: 2.0000 | Freq: Every day | NASAL | Status: DC | PRN

## 2015-12-18 MED ORDER — ACETAMINOPHEN 325 MG PO TABS
650.0000 mg | ORAL_TABLET | ORAL | Status: DC | PRN
  Administered 2015-12-18: 650 mg via ORAL
  Filled 2015-12-18: qty 2

## 2015-12-18 MED ORDER — LEVOTHYROXINE SODIUM 25 MCG PO TABS
25.0000 ug | ORAL_TABLET | Freq: Every day | ORAL | Status: DC
  Administered 2015-12-18 – 2015-12-22 (×5): 25 ug via ORAL
  Filled 2015-12-18 (×5): qty 1

## 2015-12-18 MED ORDER — GUAIFENESIN-DM 100-10 MG/5ML PO SYRP
10.0000 mL | ORAL_SOLUTION | ORAL | Status: DC | PRN
  Administered 2015-12-18 – 2015-12-21 (×3): 10 mL via ORAL
  Filled 2015-12-18 (×3): qty 10

## 2015-12-18 MED ORDER — ENOXAPARIN SODIUM 30 MG/0.3ML ~~LOC~~ SOLN
30.0000 mg | Freq: Every day | SUBCUTANEOUS | Status: DC
  Administered 2015-12-18 – 2015-12-21 (×4): 30 mg via SUBCUTANEOUS
  Filled 2015-12-18 (×4): qty 0.3

## 2015-12-18 MED ORDER — FUROSEMIDE 10 MG/ML IJ SOLN
80.0000 mg | Freq: Once | INTRAMUSCULAR | Status: AC
  Administered 2015-12-18: 80 mg via INTRAVENOUS
  Filled 2015-12-18: qty 8

## 2015-12-18 MED ORDER — PNEUMOCOCCAL VAC POLYVALENT 25 MCG/0.5ML IJ INJ
0.5000 mL | INJECTION | INTRAMUSCULAR | Status: AC
  Administered 2015-12-18: 0.5 mL via INTRAMUSCULAR
  Filled 2015-12-18: qty 0.5

## 2015-12-18 MED ORDER — SENNA 8.6 MG PO TABS
1.0000 | ORAL_TABLET | Freq: Every day | ORAL | Status: DC
  Administered 2015-12-18 – 2015-12-21 (×5): 8.6 mg via ORAL
  Filled 2015-12-18 (×5): qty 1

## 2015-12-18 MED ORDER — CLOPIDOGREL BISULFATE 75 MG PO TABS
75.0000 mg | ORAL_TABLET | Freq: Every day | ORAL | Status: DC
  Administered 2015-12-18 – 2015-12-22 (×5): 75 mg via ORAL
  Filled 2015-12-18 (×5): qty 1

## 2015-12-18 MED ORDER — METOPROLOL SUCCINATE ER 50 MG PO TB24
150.0000 mg | ORAL_TABLET | Freq: Every day | ORAL | Status: DC
  Administered 2015-12-18: 150 mg via ORAL
  Administered 2015-12-19: 50 mg via ORAL
  Filled 2015-12-18 (×2): qty 1

## 2015-12-18 MED ORDER — CLONAZEPAM 0.5 MG PO TABS: 0.5000 mg | ORAL_TABLET | Freq: Two times a day (BID) | ORAL | Status: DC | PRN

## 2015-12-18 MED ORDER — TRAZODONE HCL 50 MG PO TABS
50.0000 mg | ORAL_TABLET | Freq: Every day | ORAL | Status: DC
  Administered 2015-12-18 – 2015-12-21 (×4): 50 mg via ORAL
  Filled 2015-12-18 (×4): qty 1

## 2015-12-18 MED ORDER — ATORVASTATIN CALCIUM 40 MG PO TABS
40.0000 mg | ORAL_TABLET | Freq: Every day | ORAL | Status: DC
  Administered 2015-12-18 – 2015-12-22 (×5): 40 mg via ORAL
  Filled 2015-12-18 (×5): qty 1

## 2015-12-18 MED ORDER — GUAIFENESIN ER 600 MG PO TB12
600.0000 mg | ORAL_TABLET | Freq: Two times a day (BID) | ORAL | Status: DC
  Administered 2015-12-18 – 2015-12-22 (×10): 600 mg via ORAL
  Filled 2015-12-18 (×10): qty 1

## 2015-12-18 MED ORDER — IPRATROPIUM-ALBUTEROL 0.5-2.5 (3) MG/3ML IN SOLN
3.0000 mL | RESPIRATORY_TRACT | Status: DC | PRN
  Administered 2015-12-18: 3 mL via RESPIRATORY_TRACT
  Filled 2015-12-18: qty 3

## 2015-12-18 MED ORDER — INSULIN GLARGINE 100 UNIT/ML ~~LOC~~ SOLN
15.0000 [IU] | Freq: Two times a day (BID) | SUBCUTANEOUS | Status: DC
  Administered 2015-12-18 – 2015-12-22 (×8): 15 [IU] via SUBCUTANEOUS
  Filled 2015-12-18 (×10): qty 0.15

## 2015-12-18 MED ORDER — CLONIDINE HCL 0.3 MG PO TABS
0.3000 mg | ORAL_TABLET | Freq: Two times a day (BID) | ORAL | Status: DC
  Administered 2015-12-18 – 2015-12-19 (×5): 0.3 mg via ORAL
  Filled 2015-12-18 (×5): qty 1

## 2015-12-18 MED ORDER — SORBITOL 70 % SOLN
960.0000 mL | TOPICAL_OIL | Freq: Once | ORAL | Status: DC
  Filled 2015-12-18: qty 240

## 2015-12-18 NOTE — Consult Note (Addendum)
CARDIOLOGY CONSULT NOTE   Patient ID: Brooke Stafford MRN: [id number] DOB/AGE: 61/04/1955 61 y.o.  Admit date: 12/17/2015  Primary Physician   Angelica Chessman, MD Primary Cardiologist   Dr. Sallyanne Kuster Reason for Consultation  CHF Requesting Physician  Dr. Candiss Norse  HPI: Brooke Stafford is a 60 y.o. female with hx of CAD s/p CABG x 6 in 2002, DM, HLD, CKD, PVD s/p LSFA PTA initially in 2012 and again [id number], chronic systolic and diastolic CHF, malignant hypertension and recent CVA 10/2015 who presented for worsening sob.   She had a cath in 2011 with patent LIMA to LAD, SVG to Diag and SVG to RCA with EF 45-50%.  She had stenting of the SVG to diag 09/2013 after failing medical management. Repeat cath 11/2014 due to worsening EF showed occludded SVG to ramus and underwent DES to ramus with EF of 35-40%.   She was hospitalized with chest pain on July 25, 2015 the same day her 56 year old son was murdered in [place]. Troponin was normal. It was felt that her chest pain was probably related to acute worsening of heart failure. Her creatinine was elevated at 2.7. Her last echocardiogram at that time showed normal regional wall motion and ejection fraction of [id number] , diastolic dysfunction with evidence of increased filling pressures. She had acute worsening of her renal function, was evaluated by Dr. Moshe Cipro and her ACE inhibitor was stopped.  Recent CVA in 10/2015 with R sided residual weakness. The patient said " carotid doppler normal" however unable to find records. Unable to do MRI of brain due to costophobia. Has upcoming neurology appointment 12/21/15.    The patient presented to ER 12/17/15 evening due to acute onset worsening SOB. Other complain of constipation. Admits to have othopnea, Le edema and abdominal fullness. No syncope, CP. Elevated BNP. CXR with vascular congestion.   Past Medical History:  Diagnosis Date  . Anemia   . Anginal pain ([place])   . Anxiety   . Arthritis    "stiff fingers and knees" (08/04/2013), (12/12/2014)  . Asthma   . CAD (coronary artery disease) 2002; 2015   CABG x 6 2002, cath 2011- med Rx stent DES VG-Diag  . CAD (coronary artery disease) of artery bypass graft; DES to VG-Diag 09/28/13 11/09/2013  . Cataract   . CHF (congestive heart failure) ([place])    "in 2002" (11/26/2012)  . Chronic bronchitis ([place])    "q year; in the winter"   . Chronic renal insufficiency, stage II (mild)    followed  by [facility]  . Coronary artery disease 2002   CABG x 6. Cath 5/11- med Rx  . GERD (gastroesophageal reflux disease)   . Gout    "right big toe"  . Headache    "~ q week" (08/04/2013); "~ twice/month" (12/12/2014)  . History of blood transfusion 2002   "when I had OHS"  . Hyperlipidemia   . Hypertension   . Hypothyroid    treated  . Migraines    "couple times/year" (08/04/2013), (12/12/2014)  . Myocardial infarction ([place]) 2000; 2002; 2011  . Obesity (BMI 35.0-39.9 without comorbidity) ([place])   . Peripheral vascular disease ([place]) 12/12   LSFA PTA  . Pneumonia    "3 times I think" (12/12/2014)  . Type II diabetes mellitus ([place])      Past Surgical History:  Procedure Laterality Date  . ABDOMINAL AORTAGRAM N/A 04/05/2011   Procedure: ABDOMINAL AORTAGRAM;  Surgeon: Lorretta Harp, MD;  Location: [facility] CATH LAB;  Service: Cardiovascular;  Laterality: N/A;  . APPENDECTOMY  1980  . BREAST CYST EXCISION Right 1970's  . CARDIAC CATHETERIZATION  2002  . CARDIAC CATHETERIZATION N/A 12/12/2014   Procedure: Left Heart Cath and Cors/Grafts Angiography;  Surgeon: Peter M Martinique, MD;  Location: [facility] CV LAB;  Service: Cardiovascular;  Laterality: N/A;  . CARDIAC CATHETERIZATION N/A 12/12/2014   Procedure: Coronary Stent Intervention;  Surgeon: Peter M Martinique, MD;  Location: [place] [facility] CV LAB;  Service: Cardiovascular;  Laterality: N/A;  . [place]; 1980  . CHOLECYSTECTOMY  1982  . CORONARY ANGIOPLASTY WITH STENT PLACEMENT  2004;  2012   "I have 2 stents" (08/04/2013)  . CORONARY ANGIOPLASTY WITH STENT PLACEMENT  09/28/13   PTCA/ DES Xience stent to VG-Diag   . CORONARY ARTERY BYPASS GRAFT  11/20/2000   x6 LIMA to distal LAD, svg to first diag, svg to ramus intermediate branch and swquential SVG to cir marginal branch, SVG to posterior descending coronary and sequential SVG to first right posterolateral branch  . LEFT HEART CATHETERIZATION WITH CORONARY/GRAFT ANGIOGRAM N/A 09/28/2013   Procedure: LEFT HEART CATHETERIZATION WITH Beatrix Fetters;  Surgeon: Peter M Martinique, MD;  Location: [facility] CATH LAB;  Service: Cardiovascular;  Laterality: N/A;  . LOWER EXTREMITY ANGIOGRAM  12/01/2012   Procedure: LOWER EXTREMITY ANGIOGRAM;  Surgeon: Lorretta Harp, MD;  Location: [facility] CATH LAB;  Service: Cardiovascular;;  . NM MYOCAR PERF WALL MOTION  08/27/2004   negative  . PERCUTANEOUS STENT INTERVENTION Left 12/01/2012   Procedure: PERCUTANEOUS STENT INTERVENTION;  Surgeon: Lorretta Harp, MD;  Location[facility] CATH LAB;  Service: Cardiovascular;  Laterality: Left;  Left SFA  . PERIPHERAL ARTERIAL STENT GRAFT Left    SFA/notes 04/07/2011 (11/30/2012)  . RENAL ANGIOGRAM N/A 04/05/2011   Procedure: RENAL ANGIOGRAM;  Surgeon: Lorretta Harp, MD;  Location: [facility] CATH LAB;  Service: Cardiovascular;  Laterality: N/A;  . TUBAL LIGATION  1980    Allergies  Allergen Reactions  . Digoxin And Related Other (See Comments)    Patient stated she almost died. Had flu like symptoms as well as diarrhea.  . Hydralazine Shortness Of Breath  . Lisinopril Other (See Comments)    Felt like she had the flu. Was very sick!!!  . Penicillins Cross Reactors Hives    And high fever  . Adhesive [Tape] Rash    bruising    I have reviewed the patient's current medications . amLODipine  10 mg Oral Daily  . aspirin  81 mg Oral Daily  . atorvastatin  40 mg Oral Daily  . cloNIDine  0.3 mg Oral BID  . clopidogrel  75 mg Oral Daily  . enoxaparin (LOVENOX)  injection  30 mg Subcutaneous Daily  . ezetimibe  10 mg Oral Daily  . famotidine  20 mg Oral BID  . furosemide  40 mg Intravenous BID  . guaiFENesin  600 mg Oral BID  . insulin aspart  0-9 Units Subcutaneous TID WC  . insulin glargine  15 Units Subcutaneous BID  . isosorbide mononitrate  120 mg Oral Daily  . levothyroxine  25 mcg Oral QAC breakfast  . metoprolol succinate  150 mg Oral Daily  . senna  1 tablet Oral QHS  . sodium chloride flush  3 mL Intravenous Q12H  . traZODone  50 mg Oral QHS   . albuterol Stopped (12/18/15 0115)   sodium chloride, acetaminophen, acetaminophen-codeine, clonazePAM, fluticasone, ipratropium-albuterol, magnesium citrate, nitroGLYCERIN, ondansetron (ZOFRAN) IV, sodium chloride flush  Prior  to Admission medications   Medication Sig Start Date End Date Taking? Authorizing Provider  acetaminophen-codeine (TYLENOL #3) 300-30 MG tablet Take 1 tablet by mouth every 4 (four) hours as needed for moderate pain. 08/24/15  Yes Tresa Garter, MD  albuterol (PROVENTIL HFA;VENTOLIN HFA) 108 (90 Base) MCG/ACT inhaler Inhale 2 puffs into the lungs every 6 (six) hours as needed for wheezing or shortness of breath. 09/28/15  Yes Tresa Garter, MD  allopurinol (ZYLOPRIM) 300 MG tablet Take 1 tablet (300 mg total) by mouth daily. 07/28/15  Yes Ripudeep Krystal Eaton, MD  amLODipine (NORVASC) 10 MG tablet take 1 tablet by mouth once daily Patient taking differently: Take 10 mg by mouth daily 11/20/15  Yes Mihai Croitoru, MD  aspirin 81 MG chewable tablet Chew 1 tablet (81 mg total) by mouth daily. 02/24/15  Yes Mihai Croitoru, MD  atorvastatin (LIPITOR) 40 MG tablet Take 1 tablet (40 mg total) by mouth daily. 07/28/15  Yes Ripudeep Krystal Eaton, MD  clonazePAM (KLONOPIN) 0.5 MG tablet Take 1 tablet (0.5 mg total) by mouth 2 (two) times daily as needed for anxiety. Patient taking differently: Take 0.25 mg by mouth 2 (two) times daily as needed for anxiety.  11/30/15 12/30/15 Yes Olugbemiga  Essie Christine, MD  cloNIDine (CATAPRES) 0.3 MG tablet Take 1 tablet (0.3 mg total) by mouth 2 (two) times daily. 11/02/15  Yes Tresa Garter, MD  clopidogrel (PLAVIX) 75 MG tablet Take 1 tablet (75 mg total) by mouth daily. 09/25/15  Yes Mihai Croitoru, MD  ezetimibe (ZETIA) 10 MG tablet Take 1 tablet (10 mg total) by mouth daily. 07/28/15  Yes Ripudeep Krystal Eaton, MD  fluticasone (FLONASE) 50 MCG/ACT nasal spray Place 2 sprays into both nostrils daily as needed for allergies or rhinitis. 09/28/15  Yes Tresa Garter, MD  furosemide (LASIX) 40 MG tablet Take 1 tablet (40 mg total) by mouth daily. 07/28/15  Yes Ripudeep Krystal Eaton, MD  gabapentin (NEURONTIN) 400 MG capsule take 1 capsule by mouth three times a day Patient taking differently: Take 400 mg by mouth 3 times daily 02/16/15  Yes Olugbemiga E Doreene Burke, MD  insulin aspart (NOVOLOG FLEXPEN) 100 UNIT/ML FlexPen Inject 10-30 units into the skin 3 times daily, plus sliding scale as instructed. 12/14/15  Yes Philemon Kingdom, MD  Insulin Glargine (TOUJEO SOLOSTAR) 300 UNIT/ML SOPN Inject 40 Units into the skin at bedtime. 09/13/15  Yes Philemon Kingdom, MD  isosorbide mononitrate (IMDUR) 120 MG 24 hr tablet Take 1 tablet (120 mg total) by mouth daily. 07/28/15  Yes Ripudeep Krystal Eaton, MD  levothyroxine (SYNTHROID, LEVOTHROID) 25 MCG tablet take 1 tablet by mouth every morning BEFORE BREAKFAST Patient taking differently: Take 25 mcg by mouth every morning 11/24/15  Yes Olugbemiga E Doreene Burke, MD  loratadine (CLARITIN) 10 MG tablet Take 1 tablet (10 mg total) by mouth daily as needed for allergies. 12/15/14  Yes Luke K Kilroy, PA-C  magnesium citrate SOLN Take 296 mLs (1 Bottle total) by mouth as needed for severe constipation. 07/28/15  Yes Ripudeep Krystal Eaton, MD  metoprolol succinate (TOPROL-XL) 50 MG 24 hr tablet Take 3 tablets (150 mg total) by mouth daily. Take with or immediately following a meal. 07/28/15  Yes Ripudeep K Rai, MD  Naphazoline HCl (CLEAR EYES OP) Place 1  drop into both eyes as needed (for dry eyes).   Yes Historical Provider, MD  nitroGLYCERIN (NITROSTAT) 0.4 MG SL tablet Place 1 tablet (0.4 mg total) under the tongue every 5 (five)  minutes as needed. For chest 11/10/14  Yes Tresa Garter, MD  traZODone (DESYREL) 50 MG tablet Take 1 tablet (50 mg total) by mouth at bedtime. Patient taking differently: Take 50 mg by mouth at bedtime as needed for sleep.  09/28/15  Yes Tresa Garter, MD  triamcinolone ointment (KENALOG) 0.5 % Apply 1 application topically 2 (two) times daily. Patient taking differently: Apply 1 application topically daily as needed (for skin).  08/24/15  Yes Tresa Garter, MD  Insulin Pen Needle 31G X 5 MM MISC Use to inject insulin 4 times daily as instructed. Patient not taking: Reported on 12/18/2015 02/16/15   Philemon Kingdom, MD  Insulin Syringes, Disposable, (B-D INSULIN SYRINGE 1CC) U-100 1 ML MISC Use to inject insulin 4 times daily as instructed. Patient not taking: Reported on 12/18/2015 02/22/14   Philemon Kingdom, MD  ranitidine (ZANTAC 75) 75 MG tablet Take 1 tablet (75 mg total) by mouth 2 (two) times daily. Patient taking differently: Take 75 mg by mouth 2 (two) times daily as needed for heartburn.  07/28/15   Ripudeep Krystal Eaton, MD     Social History   Social History  . Marital status: Divorced    Spouse name: N/A  . Number of children: 2  . Years of education: 14   Occupational History  . Disabled    Social History Main Topics  . Smoking status: Former Smoker    Packs/day: 0.00    Years: 25.00    Types: Cigarettes    Quit date: 04/04/2000  . Smokeless tobacco: Never Used  . Alcohol use No     Comment: 12/12/2014  "have a glass of red wine on my birthday q yr; that's it"  . Drug use: No  . Sexual activity: Not Currently    Birth control/ protection: Abstinence   Other Topics Concern  . Not on file   Social History Narrative   Lives at home with a roommate.   Right-handed.   Occasional  caffeine use.   Her 59 year son was shot to death in 08-18-15.    Family Status  Relation Status  . Mother Deceased  . Father Deceased  . Sister Alive  . Brother Alive  . Maternal Grandmother Deceased  . Maternal Grandfather Deceased  . Paternal Grandmother Deceased  . Paternal Grandfather Deceased  . Brother   . Sister   . Sister   . Sister    Family History  Problem Relation Age of Onset  . Diabetes Mother   . Hypertension Mother   . Stroke Mother   . Hypertension Father   . Hypertension Brother   . Hypertension Sister   . Diabetes Sister   . Hyperlipidemia Sister       ROS:  Full 14 point review of systems complete and found to be negative unless listed above.  Physical Exam: Blood pressure (!) 157/63, pulse 79, temperature 98.7 F (37.1 C), temperature source Oral, resp. rate 20, height 5' 10.5" (1.791 m), weight 255 lb 4.8 oz (115.8 kg), last menstrual period 10/13/2014, SpO2 100 %.  General: Well developed, well nourished, female in no acute distress Head: Eyes PERRLA, No xanthomas. Normocephalic and atraumatic, oropharynx without edema or exudate.  Lungs: Resp regular and unlabored, CTA. Heart: RRR no s3, s4, or murmurs..   Neck: No carotid bruits. No lymphadenopathy.  JVD. Abdomen: Bowel sounds present, abdomen soft and non-tender without masses or hernias noted. Msk:  No spine or cva tenderness. No weakness, no  joint deformities or effusions. Extremities: No clubbing, cyanosis or edema. DP/PT/Radials 2+ and equal bilaterally. Neuro: Alert and oriented X 3. No focal deficits noted. Psych:  Good affect, responds appropriately Skin: No rashes or lesions noted.  Labs:   Lab Results  Component Value Date   WBC 9.6 12/18/2015   HGB 9.6 (L) 12/18/2015   HCT 30.9 (L) 12/18/2015   MCV 94.5 12/18/2015   PLT 199 12/18/2015    Recent Labs  12/17/15 2249  INR 0.91    Recent Labs Lab 12/17/15 2249  NA 139  K 4.0  CL 111  CO2 17*  BUN 32*    CREATININE 2.90*  CALCIUM 9.7  GLUCOSE 261*   Magnesium  Date Value Ref Range Status  11/08/2013 1.7 1.5 - 2.5 mg/dL Final   No results for input(s): CKTOTAL, CKMB, TROPONINI in the last 72 hours.  Recent Labs  12/17/15 2251  TROPIPOC 0.02   Pro B Natriuretic peptide (BNP)  Date/Time Value Ref Range Status  01/25/2014 01:09 PM 1,369.0 (H) 0 - 125 pg/mL Final  01/05/2011 09:47 PM 948.6 (H) 0 - 125 pg/mL Final   Lab Results  Component Value Date   CHOL 142 07/25/2015   HDL 39 (L) 07/25/2015   LDLCALC 59 07/25/2015   TRIG 221 (H) 07/25/2015   No results found for: DDIMER Lipase  Date/Time Value Ref Range Status  07/13/2012 06:52 PM 32 11 - 59 U/L Final   TSH  Date/Time Value Ref Range Status  10/22/2015 11:01 AM 3.220 0.350 - 4.500 uIU/mL Final  09/28/2013 05:50 AM 5.410 (H) 0.350 - 4.500 uIU/mL Final   No results found for: VITAMINB12, FOLATE, FERRITIN, TIBC, IRON, RETICCTPCT  Echo: 07/2015 LV EF: 50% -   55%  ------------------------------------------------------------------- Indications:      CHF - 428.0.  ------------------------------------------------------------------- History:   PMH:   Coronary artery disease.  PMH:   Myocardial infarction.  Risk factors:  Hypertension. Diabetes mellitus. Dyslipidemia.  ------------------------------------------------------------------- Study Conclusions  - Left ventricle: The cavity size was normal. Wall thickness was   normal. Systolic function was normal. The estimated ejection   fraction was in the range of 50% to 55%. Wall motion was normal;   there were no regional wall motion abnormalities. Features are   consistent with a pseudonormal left ventricular filling pattern,   with concomitant abnormal relaxation and increased filling   pressure (grade 2 diastolic dysfunction). - Left atrium: The atrium was mildly dilated.     Cath 12/12/14 1. Severe 3 vessel obstructive CAD 2. Patent LIMA to LAD 3. Patent  SVG to first diagonal- it also fills second diagonal. Stent in SVG is patent. 4. Patent SVG to PDA 5. Occluded SVG to ramus intermediate. Chronic 6. Successful stenting of the proximal ramus intermediate with a DES.  7. Elevated LV EDP (33 mm Hg)              Post-Intervention Diagram           Radiology:  Dg Chest Portable 1 View  Result Date: 12/17/2015 CLINICAL DATA:  Acute onset of shortness of breath. Initial encounter. EXAM: PORTABLE CHEST 1 VIEW COMPARISON:  Chest radiograph performed 07/25/2015 FINDINGS: The lungs are well-aerated. Mild bibasilar opacities may reflect pneumonia or pulmonary edema. Mild vascular congestion is noted. No pleural effusion or pneumothorax is seen. The cardiomediastinal silhouette is mildly enlarged. The patient is status post median sternotomy, with evidence of prior CABG. No acute osseous abnormalities are seen. IMPRESSION: Mild bibasilar opacities may  reflect pneumonia or pulmonary edema. Mild vascular congestion and mild cardiomegaly noted. Electronically Signed   By: Garald Balding M.D.   On: 12/17/2015 23:20    ASSESSMENT AND PLAN:    Principal Problem:   Acute CHF ([place]) Active Problems:   Hypothyroidism   Gout of big toe   Hypertension   Acute kidney injury superimposed on chronic kidney disease ([place])   Constipation   Plan: continue IV diuresis.   SignedLeanor Kail, [place] 12/18/2015, 1:29 PM Pager [phone]  Co-Sign MD  Patient seen and discussed with PA Crista Luria, I agree with his documentation. 60 yo female with history of CAD s/p CABG and previous PCI, chronic diastolic HF, DM2, CKD III-IV, PAD, HTN, HL admitted with SOB. Initially required bipap in ER   CXR: bilateral basilar opacities, mild congestion WBC 13.3, Hgb 11.4, Plt 235, K 4, Cr 2.9 (baseline 2.3-2.7), BNP 1144, ABG: 7.34/44/115/24 Trop neg EKG sinus tach, chronic lateral ST/T changes 07/2015 echo: LVEF 50-55%, no WMAs, grade II diastoilc  dysfunction   Acute on chronic diastolic HF. Admitted early this AM, limited I/Os thus far. She is on lasix 40mg  IV bid, follow I/Os today and renal function today. Repeat echo to see if her systolic dysfunction has reoccred given the severity of her presentation with pulmonary edema requiring bipap. Continue IV diuresis today.   Zandra Abts MD

## 2015-12-18 NOTE — ED Notes (Addendum)
Admitting MD at bedside.

## 2015-12-18 NOTE — Progress Notes (Signed)
Inpatient Diabetes Program Recommendations  AACE/ADA: New Consensus Statement on Inpatient Glycemic Control (2015)  Target Ranges:  Prepandial:   less than 140 mg/dL      Peak postprandial:   less than 180 mg/dL (1-2 hours)      Critically ill patients:  140 - 180 mg/dL   Lab Results  Component Value Date   GLUCAP 231 (H) 12/18/2015   HGBA1C 6.2 11/02/2015    Review of Glycemic Control  Diabetes history: DM2 Outpatient Diabetes medications: Toujeo40 units QHS, Novolog 10-30 units bid Current orders for Inpatient glycemic control: Lantus 20 units QHS, Novolog sensitive tidwc  Needs meal coverage insulin. First dose of Lantus will start tonight.  Inpatient Diabetes Program Recommendations:    Add Novolog 4 units tidwc for meal coverage insulin.  Will continue to follow. Thank you. Lorenda Peck, RD, LDN, CDE Inpatient Diabetes Coordinator [phone]

## 2015-12-18 NOTE — Progress Notes (Signed)
PROGRESS NOTE                                                                                                                                                                                                             Patient Demographics:    Brooke Stafford, is a 60 y.o. female, DOB - 1956/02/04, UU:[id number]  Admit date - 12/17/2015   Admitting Physician Norval Morton, MD  Outpatient Primary MD for the patient is Angelica Chessman, MD  LOS - 0  Chief Complaint  Patient presents with  . Respiratory Distress       Brief Narrative    Brooke Stafford is a 60 y.o. female with medical history significant of diastolic CHF, CAD s/p CABG, asthma/COPD, CVA in7/2017 with residual right-sided weakness, and anxiety; who presents with acute onset of shortness of breath. She expresses that for the last week she noticed that she was wheezing when she laid down at night. For the last 2 days, she complained of significant constipation for which she tried to use milk of magnesia without relief of symptoms. While straining trying to have a bowel movement yesterday evening she developed acute onset of shortness of breath.  Associated symptoms include a dry cough, abdominal distention, chills, and falls. She immediately called EMS who advised her to try her albuterol nebulizer machine, and subsequently nitroglycerin without relief of symptoms. She notes over the last week that she's gained approximately 6 - 8 pounds, and 26 pounds over the last year. She just received her flu vaccine last month. Denies any headache, hemoptysis, blood in stool/urine, or dysuria.   ED Course: Upon EMS arrival, the patient was placed on CPAP, but continued to have significant respiratory distress. On arrival to the emergency department they were in the process of preparing the patient for intubation for protection of airway. However, the patient was switched to  BiPAP with improvement in respiratory status and did not require intubation. Now on nasal cannula oxygen although patient does not use oxygen at home. Initial lab work revealed elevated BNP of 1144 and imaging studies suggest pulmonary edema. Patient was given 80 mg of Lasix IV.  [facility] called to admit.    Subjective:    Brooke Stafford today has, No headache, No chest pain, No abdominal pain - No Nausea, No new weakness tingling or numbness, No  Cough - Improved but +ve SOB.   Assessment  & Plan :     1.Acute on chronic diastolic CHF EF [id number] on echocardiogram in April 2017. Continue IV Lasix, is showing improvement, continue Imdur, continue beta blocker, oxygen nebulizer treatments as needed, salt and fluid restriction, monitor intake and output and weight. Cardiology consulted. -1 Lit  Filed Weights   12/18/15 0227  Weight: 115.8 kg (255 lb 4.8 oz)    2.Acute hypoxic respiratory failure due to #1 above. Treatment as above  3. ARF on CK D stage III. Baseline creatinine close to 1.5-2, worse due to CHF decompensation, diabetes, repeat BMP, check renal ultrasound. Avoid nephrotoxins. On her recent UA in April she had significant proteinuria. If creatinine back to baseline we'll have her follow with her from the outpatient. We'll try to introduce ACE/ARB if renal function permits.  4. Essential hypertension. He on combination of Norvasc, clonidine, beta blocker, diuretics will monitor and adjust.  5. CAD status post CABG. Chest pain-free, no acute issues, continue aspirin, Plavix, statin along with beta blocker for secondary prevention.   6.Hypothyroidism. Continue home dose Synthroid TSH was stable.  7. Dyslipidemia. On statin.  8. Anxiety. Stable on on up and as needed.  9. DM type II. On Lantus and sliding scale monitor closely.  Lab Results  Component Value Date   HGBA1C 6.2 11/02/2015   CBG (last 3)   Recent Labs  12/18/15 0220 12/18/15 0623 12/18/15 1145  GLUCAP 238* 231*  199*     Family Communication  :  None  Code Status :  Full  Diet : Heart Healthy Low Carb  Disposition Plan  :  Stay inpt  Consults  :  Cards  Procedures  :    TTE April 2017 -  Left ventricle: The cavity size was normal. Wall thickness was normal. Systolic function was normal. The estimated ejection fraction was in the range of 50% to 55%. Wall motion was normal;  there were no regional wall motion abnormalities. Features are consistent with a pseudonormal left ventricular filling pattern, with concomitant abnormal relaxation and increased filling  pressure (grade 2 diastolic dysfunction).  Left atrium: The atrium was mildly dilated.   DVT Prophylaxis  :  Lovenox    Lab Results  Component Value Date   PLT 199 12/18/2015    Inpatient Medications  Scheduled Meds: . amLODipine  10 mg Oral Daily  . aspirin  81 mg Oral Daily  . atorvastatin  40 mg Oral Daily  . cloNIDine  0.3 mg Oral BID  . clopidogrel  75 mg Oral Daily  . enoxaparin (LOVENOX) injection  30 mg Subcutaneous Daily  . ezetimibe  10 mg Oral Daily  . famotidine  20 mg Oral BID  . furosemide  40 mg Intravenous BID  . guaiFENesin  600 mg Oral BID  . insulin aspart  0-9 Units Subcutaneous TID WC  . insulin glargine  20 Units Subcutaneous QHS  . isosorbide mononitrate  120 mg Oral Daily  . levothyroxine  25 mcg Oral QAC breakfast  . metoprolol succinate  150 mg Oral Daily  . senna  1 tablet Oral QHS  . sodium chloride flush  3 mL Intravenous Q12H  . traZODone  50 mg Oral QHS   Continuous Infusions: . albuterol Stopped (12/18/15 0115)   PRN Meds:.sodium chloride, acetaminophen, acetaminophen-codeine, clonazePAM, fluticasone, ipratropium-albuterol, magnesium citrate, nitroGLYCERIN, ondansetron (ZOFRAN) IV, sodium chloride flush  Antibiotics  :    Anti-infectives    None  Objective:   Vitals:   12/18/15 0100 12/18/15 0115 12/18/15 0227 12/18/15 0555  BP: 149/76 151/66 (!) 178/77 (!) 157/63    Pulse: 89 86 88 79  Resp: 24 18 18 20   Temp:   99 F (37.2 C) 98.7 F (37.1 C)  TempSrc:   Oral Oral  SpO2: 93% 96% 94% 100%  Weight:   115.8 kg (255 lb 4.8 oz)   Height:   5' 10.5" (1.791 m)     Wt Readings from Last 3 Encounters:  12/18/15 115.8 kg (255 lb 4.8 oz)  12/14/15 117 kg (258 lb)  11/30/15 114.5 kg (252 lb 6.4 oz)     Intake/Output Summary (Last 24 hours) at 12/18/15 1204 Last data filed at 12/18/15 1103  Gross per 24 hour  Intake              240 ml  Output             1250 ml  Net            -1010 ml     Physical Exam  Awake Alert, Oriented X 3, No new F.N deficits, Normal affect [place].AT,PERRAL Supple Neck,No JVD, No cervical lymphadenopathy appriciated.  Symmetrical Chest wall movement, Good air movement bilaterally, +ve rales RRR,No Gallops,Rubs or new Murmurs, No Parasternal Heave +ve B.Sounds, Abd Soft, No tenderness, No organomegaly appriciated, No rebound - guarding or rigidity. No Cyanosis, Clubbing , 1+ edema, No new Rash or bruise       Data Review:    CBC  Recent Labs Lab 12/17/15 2249 12/18/15 0336  WBC 13.3* 9.6  HGB 11.4* 9.6*  HCT 35.6* 30.9*  PLT 235 199  MCV 95.4 94.5  MCH 30.6 29.4  MCHC 32.0 31.1  RDW 15.4 15.5  LYMPHSABS  --  0.5*  MONOABS  --  0.5  EOSABS  --  0.1  BASOSABS  --  0.0    Chemistries   Recent Labs Lab 12/17/15 2249  NA 139  K 4.0  CL 111  CO2 17*  GLUCOSE 261*  BUN 32*  CREATININE 2.90*  CALCIUM 9.7   ------------------------------------------------------------------------------------------------------------------ No results for input(s): CHOL, HDL, LDLCALC, TRIG, CHOLHDL, LDLDIRECT in the last 72 hours.  Lab Results  Component Value Date   HGBA1C 6.2 11/02/2015   ------------------------------------------------------------------------------------------------------------------ No results for input(s): TSH, T4TOTAL, T3FREE, THYROIDAB in the last 72 hours.  Invalid input(s):  FREET3 ------------------------------------------------------------------------------------------------------------------ No results for input(s): VITAMINB12, FOLATE, FERRITIN, TIBC, IRON, RETICCTPCT in the last 72 hours.  Coagulation profile  Recent Labs Lab 12/17/15 2249  INR 0.91    No results for input(s): DDIMER in the last 72 hours.  Cardiac Enzymes No results for input(s): CKMB, TROPONINI, MYOGLOBIN in the last 168 hours.  Invalid input(s): CK ------------------------------------------------------------------------------------------------------------------    Component Value Date/Time   BNP 1,144.8 (H) 12/17/2015 2249    Micro Results No results found for this or any previous visit (from the past 240 hour(s)).  Radiology Reports Dg Chest Portable 1 View  Result Date: 12/17/2015 CLINICAL DATA:  Acute onset of shortness of breath. Initial encounter. EXAM: PORTABLE CHEST 1 VIEW COMPARISON:  Chest radiograph performed 07/25/2015 FINDINGS: The lungs are well-aerated. Mild bibasilar opacities may reflect pneumonia or pulmonary edema. Mild vascular congestion is noted. No pleural effusion or pneumothorax is seen. The cardiomediastinal silhouette is mildly enlarged. The patient is status post median sternotomy, with evidence of prior CABG. No acute osseous abnormalities are seen. IMPRESSION: Mild bibasilar opacities may reflect pneumonia or  pulmonary edema. Mild vascular congestion and mild cardiomegaly noted. Electronically Signed   By: Garald Balding M.D.   On: 12/17/2015 23:20    Time Spent in minutes  30   SINGH,PRASHANT K M.D on 12/18/2015 at 12:04 PM  Between 7am to 7pm - Pager - [phone]  After 7pm go to [email or web address] - password [facility]  [facility] -  Office  [phone]

## 2015-12-18 NOTE — Telephone Encounter (Signed)
Error

## 2015-12-18 NOTE — H&P (Signed)
History and Physical    Brooke Stafford [id number] DOB: 02/06/1956 DOA: 12/17/2015  Referring MD/NP/PA: Dr. Tomi Bamberger PCP: Angelica Chessman, MD  Patient coming from: From home via EMS  Chief Complaint: Couldn't breathe  HPI: Brooke Stafford is a 60 y.o. female with medical history significant of diastolic CHF, CAD s/p CABG, asthma/COPD, CVA in7/2017 with residual right-sided weakness, and anxiety; who presents with acute onset of shortness of breath. She expresses that for the last week she noticed that she was wheezing when she laid down at night. For the last 2 days, she complained of significant constipation for which she tried to use milk of magnesia without relief of symptoms. While straining trying to have a bowel movement yesterday evening she developed acute onset of shortness of breath.  Associated symptoms include a dry cough, abdominal distention, chills, and falls. She immediately called EMS who advised her to try her albuterol nebulizer machine, and subsequently nitroglycerin without relief of symptoms. She notes over the last week that she's gained approximately 6 - 8 pounds, and 26 pounds over the last year. She just received her flu vaccine last month. Denies any headache, hemoptysis, blood in stool/urine, or dysuria.   ED Course: Upon EMS arrival, the patient was placed on CPAP, but continued to have significant respiratory distress. On arrival to the emergency department they were in the process of preparing the patient for intubation for protection of airway. However, the patient was switched to BiPAP with improvement in respiratory status and did not require intubation. Now on nasal cannula oxygen although patient does not use oxygen at home. Initial lab work revealed elevated BNP of 1144 and imaging studies suggest pulmonary edema. Patient was given 80 mg of Lasix IV.  TRH called to admit.  Review of Systems: As per HPI otherwise 10 point review of systems negative.   Past  Medical History:  Diagnosis Date  . Anemia   . Anginal pain ([place])   . Anxiety   . Arthritis    "stiff fingers and knees" (08/04/2013), (12/12/2014)  . Asthma   . CAD (coronary artery disease) 2002; 2015   CABG x 6 2002, cath 2011- med Rx stent DES VG-Diag  . CAD (coronary artery disease) of artery bypass graft; DES to VG-Diag 09/28/13 11/09/2013  . Cataract   . CHF (congestive heart failure) ([place])    "in 2002" (11/26/2012)  . Chronic bronchitis ([place])    "q year; in the winter"   . Chronic renal insufficiency, stage II (mild)    followed  by [facility]  . Coronary artery disease 2002   CABG x 6. Cath 5/11- med Rx  . GERD (gastroesophageal reflux disease)   . Gout    "right big toe"  . Headache    "~ q week" (08/04/2013); "~ twice/month" (12/12/2014)  . History of blood transfusion 2002   "when I had OHS"  . Hyperlipidemia   . Hypertension   . Hypothyroid    treated  . Migraines    "couple times/year" (08/04/2013), (12/12/2014)  . Myocardial infarction ([place]) 2000; 2002; 2011  . Obesity (BMI 35.0-39.9 without comorbidity) ([place])   . Peripheral vascular disease ([place]) 12/12   LSFA PTA  . Pneumonia    "3 times I think" (12/12/2014)  . Type II diabetes mellitus ([place])     Past Surgical History:  Procedure Laterality Date  . ABDOMINAL AORTAGRAM N/A 04/05/2011   Procedure: ABDOMINAL AORTAGRAM;  Surgeon: Lorretta Harp, MD;  Location: [facility] CATH LAB;  Service: Cardiovascular;  Laterality: N/A;  . APPENDECTOMY  1980  . BREAST CYST EXCISION Right 1970's  . CARDIAC CATHETERIZATION  2002  . CARDIAC CATHETERIZATION N/A 12/12/2014   Procedure: Left Heart Cath and Cors/Grafts Angiography;  Surgeon: Peter M Martinique, MD;  Location: [place] CV LAB;  Service: Cardiovascular;  Laterality: N/A;  . CARDIAC CATHETERIZATION N/A 12/12/2014   Procedure: Coronary Stent Intervention;  Surgeon: Peter M Martinique, MD;  Location: [place] CV LAB;  Service: Cardiovascular;  Laterality: N/A;  . [place]; 1980  . CHOLECYSTECTOMY  1982  . CORONARY ANGIOPLASTY WITH STENT PLACEMENT  2004; 2012   "I have 2 stents" (08/04/2013)  . CORONARY ANGIOPLASTY WITH STENT PLACEMENT  09/28/13   PTCA/ DES Xience stent to VG-Diag   . CORONARY ARTERY BYPASS GRAFT  11/20/2000   x6 LIMA to distal LAD, svg to first diag, svg to ramus intermediate branch and swquential SVG to cir marginal branch, SVG to posterior descending coronary and sequential SVG to first right posterolateral branch  . LEFT HEART CATHETERIZATION WITH CORONARY/GRAFT ANGIOGRAM N/A 09/28/2013   Procedure: LEFT HEART CATHETERIZATION WITH Beatrix Fetters;  Surgeon: Peter M Martinique, MD;  Location: [facility] CATH LAB;  Service: Cardiovascular;  Laterality: N/A;  . LOWER EXTREMITY ANGIOGRAM  12/01/2012   Procedure: LOWER EXTREMITY ANGIOGRAM;  Surgeon: Lorretta Harp, MD;  Location: [facility] CATH LAB;  Service: Cardiovascular;;  . NM MYOCAR PERF WALL MOTION  08/27/2004   negative  . PERCUTANEOUS STENT INTERVENTION Left 12/01/2012   Procedure: PERCUTANEOUS STENT INTERVENTION;  Surgeon: Lorretta Harp, MD;  Location: [facility] CATH LAB;  Service: Cardiovascular;  Laterality: Left;  Left SFA  . PERIPHERAL ARTERIAL STENT GRAFT Left    SFA/notes 04/07/2011 (11/30/2012)  . RENAL ANGIOGRAM N/A 04/05/2011   Procedure: RENAL ANGIOGRAM;  Surgeon: Lorretta Harp, MD;  Location: [facility] CATH LAB;  Service: Cardiovascular;  Laterality: N/A;  . [place]     reports that she quit smoking about 15 years ago. Her smoking use included Cigarettes. She smoked 0.00 packs per day for 25.00 years. She has never used smokeless tobacco. She reports that she does not drink alcohol or use drugs.  Allergies  Allergen Reactions  . Digoxin And Related Other (See Comments)    Patient stated she almost died. Had flu like symptoms as well as diarrhea.  . Hydralazine Shortness Of Breath  . Lisinopril Other (See Comments)    Felt like she had the flu. Was very sick!!!  .  Penicillins Cross Reactors Hives    And high fever  . Adhesive [Tape] Rash    bruising    Family History  Problem Relation Age of Onset  . Diabetes Mother   . Hypertension Mother   . Stroke Mother   . Hypertension Father   . Hypertension Brother   . Hypertension Sister   . Diabetes Sister   . Hyperlipidemia Sister     Prior to Admission medications   Medication Sig Start Date End Date Taking? Authorizing Provider  acetaminophen-codeine (TYLENOL #3) 300-30 MG tablet Take 1 tablet by mouth every 4 (four) hours as needed for moderate pain. 08/24/15   Tresa Garter, MD  albuterol (PROVENTIL HFA;VENTOLIN HFA) 108 (90 Base) MCG/ACT inhaler Inhale 2 puffs into the lungs every 6 (six) hours as needed for wheezing or shortness of breath. 09/28/15   Tresa Garter, MD  allopurinol (ZYLOPRIM) 300 MG tablet Take 1 tablet (300 mg total) by mouth daily. 07/28/15  Ripudeep Krystal Eaton, MD  amLODipine (NORVASC) 10 MG tablet take 1 tablet by mouth once daily 11/20/15   Sanda Klein, MD  aspirin 81 MG chewable tablet Chew 1 tablet (81 mg total) by mouth daily. 02/24/15   Mihai Croitoru, MD  atorvastatin (LIPITOR) 40 MG tablet Take 1 tablet (40 mg total) by mouth daily. 07/28/15   Ripudeep Krystal Eaton, MD  clonazePAM (KLONOPIN) 0.5 MG tablet Take 1 tablet (0.5 mg total) by mouth 2 (two) times daily as needed for anxiety. 11/30/15 12/30/15  Tresa Garter, MD  cloNIDine (CATAPRES) 0.3 MG tablet Take 1 tablet (0.3 mg total) by mouth 2 (two) times daily. 11/02/15   Tresa Garter, MD  clopidogrel (PLAVIX) 75 MG tablet Take 1 tablet (75 mg total) by mouth daily. 09/25/15   Mihai Croitoru, MD  ezetimibe (ZETIA) 10 MG tablet Take 1 tablet (10 mg total) by mouth daily. 07/28/15   Ripudeep Krystal Eaton, MD  fluticasone (FLONASE) 50 MCG/ACT nasal spray Place 2 sprays into both nostrils daily as needed for allergies or rhinitis. 09/28/15   Tresa Garter, MD  furosemide (LASIX) 40 MG tablet Take 1 tablet (40 mg  total) by mouth daily. 07/28/15   Ripudeep Krystal Eaton, MD  gabapentin (NEURONTIN) 400 MG capsule take 1 capsule by mouth three times a day 02/16/15   Tresa Garter, MD  insulin aspart (NOVOLOG FLEXPEN) 100 UNIT/ML FlexPen Inject 10-30 units into the skin 3 times daily, plus sliding scale as instructed. 12/14/15   Philemon Kingdom, MD  Insulin Glargine (TOUJEO SOLOSTAR) 300 UNIT/ML SOPN Inject 40 Units into the skin at bedtime. 09/13/15   Philemon Kingdom, MD  Insulin Pen Needle 31G X 5 MM MISC Use to inject insulin 4 times daily as instructed. 02/16/15   Philemon Kingdom, MD  Insulin Syringes, Disposable, (B-D INSULIN SYRINGE 1CC) U-100 1 ML MISC Use to inject insulin 4 times daily as instructed. 02/22/14   Philemon Kingdom, MD  isosorbide mononitrate (IMDUR) 120 MG 24 hr tablet Take 1 tablet (120 mg total) by mouth daily. 07/28/15   Ripudeep Krystal Eaton, MD  levothyroxine (SYNTHROID, LEVOTHROID) 25 MCG tablet take 1 tablet by mouth every morning BEFORE BREAKFAST 11/24/15   Tresa Garter, MD  loratadine (CLARITIN) 10 MG tablet Take 1 tablet (10 mg total) by mouth daily as needed for allergies. 12/15/14   Erlene Quan, PA-C  magnesium citrate SOLN Take 296 mLs (1 Bottle total) by mouth as needed for severe constipation. 07/28/15   Ripudeep Krystal Eaton, MD  Menthol-Camphor (TIGER BALM ARTHRITIS RUB) 11-11 % CREA Apply 1 application topically 2 (two) times daily.    Historical Provider, MD  metoprolol succinate (TOPROL-XL) 50 MG 24 hr tablet Take 3 tablets (150 mg total) by mouth daily. Take with or immediately following a meal. 07/28/15   Ripudeep Krystal Eaton, MD  nitroGLYCERIN (NITROSTAT) 0.4 MG SL tablet Place 1 tablet (0.4 mg total) under the tongue every 5 (five) minutes as needed. For chest 11/10/14   Tresa Garter, MD  ranitidine (ZANTAC 75) 75 MG tablet Take 1 tablet (75 mg total) by mouth 2 (two) times daily. 07/28/15   Ripudeep Krystal Eaton, MD  traZODone (DESYREL) 50 MG tablet Take 1 tablet (50 mg total) by mouth at  bedtime. 09/28/15   Tresa Garter, MD  triamcinolone ointment (KENALOG) 0.5 % Apply 1 application topically 2 (two) times daily. 08/24/15   Tresa Garter, MD    Physical Exam:    Constitutional:  Obese female who appears to be in no acute distress at this time Vitals:   12/17/15 2247 12/17/15 2300 12/17/15 2309 12/17/15 2330  BP:    156/70  Pulse: 101 84 84 94  Resp: (!) 28 16 18 18   Temp:      TempSrc:      SpO2: (!) 88% 100%  100%   Eyes: PERRL, lids and conjunctivae normal ENMT: Mucous membranes are moist. Posterior pharynx clear of any exudate or lesions.Normal dentition.  Neck: normal, supple, no masses, no thyromegaly Respiratory: Tachypneic with on expiratory wheezes appreciated. Normal respiratory effort. No accessory muscle use. On nasal cannula oxygen  Cardiovascular: Regular rate and rhythm, no murmurs / rubs / gallops. No extremity edema. 2+ pedal pulses. No carotid bruits.  Abdomen: no tenderness, no masses palpated. No hepatosplenomegaly. Bowel sounds positive.  Musculoskeletal: no clubbing / cyanosis. No joint deformity upper and lower extremities. Good ROM, no contractures. Normal muscle tone.  Skin: no rashes, lesions, ulcers. No induration Neurologic: CN 2-12 grossly intact. Sensation intact, DTR normal. Strength 5/5 in all 4.  Psychiatric: Normal judgment and insight. Alert and oriented x 3. Anxious mood.     Labs on Admission: I have personally reviewed following labs and imaging studies  CBC:  Recent Labs Lab 12/17/15 2249  WBC 13.3*  HGB 11.4*  HCT 35.6*  MCV 95.4  PLT [id number]   Basic Metabolic Panel:  Recent Labs Lab 12/17/15 2249  NA 139  K 4.0  CL 111  CO2 17*  GLUCOSE 261*  BUN 32*  CREATININE 2.90*  CALCIUM 9.7   GFR: Estimated Creatinine Clearance: 28.5 mL/min (by C-G formula based on SCr of 2.9 mg/dL). Liver Function Tests: No results for input(s): AST, ALT, ALKPHOS, BILITOT, PROT, ALBUMIN in the last 168 hours. No  results for input(s): LIPASE, AMYLASE in the last 168 hours. No results for input(s): AMMONIA in the last 168 hours. Coagulation Profile:  Recent Labs Lab 12/17/15 2249  INR 0.91   Cardiac Enzymes: No results for input(s): CKTOTAL, CKMB, CKMBINDEX, TROPONINI in the last 168 hours. BNP (last 3 results) No results for input(s): PROBNP in the last 8760 hours. HbA1C: No results for input(s): HGBA1C in the last 72 hours. CBG: No results for input(s): GLUCAP in the last 168 hours. Lipid Profile: No results for input(s): CHOL, HDL, LDLCALC, TRIG, CHOLHDL, LDLDIRECT in the last 72 hours. Thyroid Function Tests: No results for input(s): TSH, T4TOTAL, FREET4, T3FREE, THYROIDAB in the last 72 hours. Anemia Panel: No results for input(s): VITAMINB12, FOLATE, FERRITIN, TIBC, IRON, RETICCTPCT in the last 72 hours. Urine analysis:    Component Value Date/Time   COLORURINE YELLOW 07/25/2015 2326   APPEARANCEUR CLEAR 07/25/2015 2326   LABSPEC 1.015 07/25/2015 2326   PHURINE 5.5 07/25/2015 2326   GLUCOSEU 100 (A) 07/25/2015 2326   HGBUR MODERATE (A) 07/25/2015 2326   BILIRUBINUR NEGATIVE 07/25/2015 2326   BILIRUBINUR neg 06/02/2013 1540   KETONESUR NEGATIVE 07/25/2015 2326   PROTEINUR >300 (A) 07/25/2015 2326   UROBILINOGEN 0.2 12/14/2014 1700   NITRITE NEGATIVE 07/25/2015 2326   LEUKOCYTESUR NEGATIVE 07/25/2015 2326   Sepsis Labs: No results found for this or any previous visit (from the past 240 hour(s)).   Radiological Exams on Admission: Dg Chest Portable 1 View  Result Date: 12/17/2015 CLINICAL DATA:  Acute onset of shortness of breath. Initial encounter. EXAM: PORTABLE CHEST 1 VIEW COMPARISON:  Chest radiograph performed 07/25/2015 FINDINGS: The lungs are well-aerated. Mild bibasilar opacities may reflect pneumonia or  pulmonary edema. Mild vascular congestion is noted. No pleural effusion or pneumothorax is seen. The cardiomediastinal silhouette is mildly enlarged. The patient is  status post median sternotomy, with evidence of prior CABG. No acute osseous abnormalities are seen. IMPRESSION: Mild bibasilar opacities may reflect pneumonia or pulmonary edema. Mild vascular congestion and mild cardiomegaly noted. Electronically Signed   By: Garald Balding M.D.   On: 12/17/2015 23:20    EKG: Independently reviewed. Sinus tachycardia with nonspecific T-wave changes  Assessment/Plan Acute diastolic CHF [facility]): Patient was seen to have have acute respiratory distress. Last echocardiogram in 07/2015 with an EF of 50-55% with grade 2 diastolic dysfunction. Patient given 80 mg of Lasix IV in ED - Admit to a telemetry bed - CHF protocol initiated  - Strict intake and output - Lasix IV, adjust diuresis as needed - Continue isosorbide mononitrate - Repeat echocardiogram did not ordered secondary to recent echo in 07/2015 - Will need to consult cardiology in a.m.   Acute respiratory distress/pulmonary edema - Continuous pulse oximetry with nasal cannula oxygen to keep O2 sats greater than 92% - DuoNeb's when necessary  Acute kidney injury on chronic kidney disease stage III: Baseline creatinine had been around 2.3-2.5. On admission creatinine 2.9 with a BUN of 32. Question if this is secondary to poor perfusion to kidneys in setting of CHF. - Follow-up repeat BMP   CAD s/p CABG and stents - Continue Plavix and aspirin  Essential hypertension, uncontrolled - Continue amlodipine, clonidine, and metoprolol   Hypothyroidism - Check TSH - Continue levothyroxine  Anxiety - Klonopin prn anxiety  Diabetes mellitus type 2  -  Hypoglycemic protocols - Reduced glargine dose to 20 units daily at bedtime 2/2 to patient being a more controlled environment, adjust as needed -  Gabapentin dose adjustment will be needed for reduced kidney function  Constipation - Started senna - Continue magnesium citrate prn  Hyperlipidemia - Continue Atorvastatin and Zetia  Gout - Allopurinol  dose adjustment will be needed for reduced kidney function  GERD  - Pharmacy substitution of Pepcid  DVT prophylaxis: Lovenox  Code Status: Full  Family Communication: Discussed with patient's partner present at bedside Disposition Plan: Possible discharge home in 3-4 days after properly diuresed Consults called:  Admission status: Inpatient telemetry  Norval Morton MD [facility] Pager [phone]  If 7PM-7AM, please contact night-coverage [email or web address] Password [email or web address]  12/18/2015, 1:14 AM

## 2015-12-18 NOTE — Progress Notes (Signed)
RT NOTE:  BIPAP removed following ABG. Pt now on 3L O2 via cannula, Sats 95%. Tolerating well, No distress.

## 2015-12-19 ENCOUNTER — Ambulatory Visit: Payer: Medicare Other | Admitting: Neurology

## 2015-12-19 ENCOUNTER — Inpatient Hospital Stay (HOSPITAL_COMMUNITY): Payer: Medicare Other

## 2015-12-19 DIAGNOSIS — I5021 Acute systolic (congestive) heart failure: Secondary | ICD-10-CM

## 2015-12-19 DIAGNOSIS — I10 Essential (primary) hypertension: Secondary | ICD-10-CM

## 2015-12-19 DIAGNOSIS — R0600 Dyspnea, unspecified: Secondary | ICD-10-CM

## 2015-12-19 DIAGNOSIS — I5023 Acute on chronic systolic (congestive) heart failure: Secondary | ICD-10-CM

## 2015-12-19 LAB — GLUCOSE, CAPILLARY
GLUCOSE-CAPILLARY: 233 mg/dL — AB (ref 65–99)
GLUCOSE-CAPILLARY: 249 mg/dL — AB (ref 65–99)
GLUCOSE-CAPILLARY: 216 mg/dL — AB (ref 65–99)
Glucose-Capillary: 141 mg/dL — ABNORMAL HIGH (ref 65–99)

## 2015-12-19 LAB — ECHOCARDIOGRAM COMPLETE
CHL CUP MV DEC (S): 141
E decel time: 141 msec
EERAT: 16.62
FS: 21 % — AB (ref 28–44)
Height: 70.5 in
IVS/LV PW RATIO, ED: 0.84
LA ID, A-P, ES: 51 mm
LA diam end sys: 51 mm
LA diam index: 2.11 cm/m2
LA vol A4C: 84.3 ml
LA vol index: 34.6 mL/m2
LA vol: 83.5 mL
LDCA: 4.15 cm2
LV E/e' medial: 16.62
LV E/e'average: 16.62
LVELAT: 7.4 cm/s
LVOTD: 23 mm
MV pk A vel: 129 m/s
MVPG: 6 mmHg
MVPKEVEL: 123 m/s
PW: 12.1 mm — AB (ref 0.6–1.1)
RV LATERAL S' VELOCITY: 8 cm/s
RV TAPSE: 14.6 mm
TDI e' lateral: 7.4
Weight: 3998.4 oz

## 2015-12-19 LAB — BASIC METABOLIC PANEL
ANION GAP: 7 (ref 5–15)
ANION GAP: 8 (ref 5–15)
BUN: 41 mg/dL — ABNORMAL HIGH (ref 6–20)
BUN: 34 mg/dL — ABNORMAL HIGH (ref 6–20)
CO2: 24 mmol/L (ref 22–32)
CO2: 27 mmol/L (ref 22–32)
Calcium: 9.7 mg/dL (ref 8.9–10.3)
Calcium: 10 mg/dL (ref 8.9–10.3)
Chloride: 107 mmol/L (ref 101–111)
Chloride: 104 mmol/L (ref 101–111)
Creatinine, Ser: 3.03 mg/dL — ABNORMAL HIGH (ref 0.44–1.00)
Creatinine, Ser: 3.14 mg/dL — ABNORMAL HIGH (ref 0.44–1.00)
GFR calc Af Amer: 18 mL/min — ABNORMAL LOW (ref >60)
GFR, EST AFRICAN AMERICAN: 17 mL/min — AB (ref >60)
GFR, EST NON AFRICAN AMERICAN: 16 mL/min — AB (ref >60)
GFR, EST NON AFRICAN AMERICAN: 15 mL/min — AB (ref >60)
GLUCOSE: 181 mg/dL — AB (ref 65–99)
GLUCOSE: 244 mg/dL — AB (ref 65–99)
POTASSIUM: 6.1 mmol/L — AB (ref 3.5–5.1)
POTASSIUM: 4.4 mmol/L (ref 3.5–5.1)
Sodium: 138 mmol/L (ref 135–145)
Sodium: 139 mmol/L (ref 135–145)

## 2015-12-19 LAB — POTASSIUM: POTASSIUM: 4.5 mmol/L (ref 3.5–5.1)

## 2015-12-19 MED ORDER — SODIUM POLYSTYRENE SULFONATE 15 GM/60ML PO SUSP
30.0000 g | Freq: Once | ORAL | Status: AC
  Administered 2015-12-19: 30 g via ORAL
  Filled 2015-12-19: qty 120

## 2015-12-19 MED ORDER — METOPROLOL SUCCINATE ER 100 MG PO TB24
200.0000 mg | ORAL_TABLET | Freq: Every day | ORAL | Status: DC
  Administered 2015-12-20 – 2015-12-22 (×3): 200 mg via ORAL
  Filled 2015-12-19 (×3): qty 2

## 2015-12-19 MED ORDER — MAGNESIUM CITRATE PO SOLN
1.0000 | Freq: Once | ORAL | Status: AC
  Administered 2015-12-19: 1 via ORAL

## 2015-12-19 NOTE — Progress Notes (Signed)
PROGRESS NOTE                                                                                                                                                                                                             Patient Demographics:    Brooke Stafford, is a 60 y.o. female, DOB - 11/14/1955, UU:[id number]  Admit date - 12/17/2015   Admitting Physician Norval Morton, MD  Outpatient Primary MD for the patient is Angelica Chessman, MD  LOS - 1  Chief Complaint  Patient presents with  . Respiratory Distress       Brief Narrative    Brooke Stafford is a 60 y.o. female with medical history significant of diastolic CHF, CAD s/p CABG, asthma/COPD, CVA in7/2017 with residual right-sided weakness, and anxiety; who presents with acute onset of shortness of breath. She expresses that for the last week she noticed that she was wheezing when she laid down at night. For the last 2 days, she complained of significant constipation for which she tried to use milk of magnesia without relief of symptoms. While straining trying to have a bowel movement yesterday evening she developed acute onset of shortness of breath.  Associated symptoms include a dry cough, abdominal distention, chills, and falls. She immediately called EMS who advised her to try her albuterol nebulizer machine, and subsequently nitroglycerin without relief of symptoms. She notes over the last week that she's gained approximately 6 - 8 pounds, and 26 pounds over the last year. She just received her flu vaccine last month. Denies any headache, hemoptysis, blood in stool/urine, or dysuria.   ED Course: Upon EMS arrival, the patient was placed on CPAP, but continued to have significant respiratory distress. On arrival to the emergency department they were in the process of preparing the patient for intubation for protection of airway. However, the patient was switched to  BiPAP with improvement in respiratory status and did not require intubation. Now on nasal cannula oxygen although patient does not use oxygen at home. Initial lab work revealed elevated BNP of 1144 and imaging studies suggest pulmonary edema. Patient was given 80 mg of Lasix IV.  [facility] called to admit.    Subjective:    Brooke Stafford today has, No headache, No chest pain, No abdominal pain - No Nausea, No new weakness tingling or numbness, No  Cough - Improved but +ve SOB.   Assessment  & Plan :     1.Acute on chronic diastolic CHF EF [id number] on echocardiogram in April 2017. Continue IV Lasix, is showing improvement, continue Imdur, continue beta blocker, oxygen nebulizer treatments as needed, salt and fluid restriction, monitor intake and output and weight. Cardiology consulted. SOB much better, - 2 Lit  [facility] Weights   12/18/15 0227 12/19/15 0421  Weight: 115.8 kg (255 lb 4.8 oz) 113.4 kg (249 lb 14.4 oz)    2. Acute hypoxic respiratory failure due to #1 above. Treatment as above  3. ARF on CK D stage III with Hyperkalemia. Baseline creatinine close to 1.5-2, worse due to CHF decompensation, diurese, repeat BMP, stable renal ultrasound. Avoid nephrotoxins. On her recent UA in April she had significant proteinuria. If creatinine back to baseline we'll have her follow with her from the outpatient. We'll try to introduce ACE/ARB if renal function permits.Provided Kayexalate to drop her potassium, will monitor potassium levels closely. If renal function worsens any further will involve nephrology inpatient.  4. Essential hypertension. He on combination of Norvasc, clonidine, beta blocker, diuretics will monitor and adjust.  5. CAD status post CABG. Chest pain-free, no acute issues, continue aspirin, Plavix, statin along with beta blocker for secondary prevention.   6.Hypothyroidism. Continue home dose Synthroid TSH was stable.  7. Dyslipidemia. On statin.  8. Anxiety. Stable on on up and as  needed.  9. DM type II. On Lantus and sliding scale monitor closely.  Lab Results  Component Value Date   HGBA1C 6.2 11/02/2015   CBG (last 3)   Recent Labs  12/18/15 1659 12/18/15 2046 12/19/15 0640  GLUCAP 297* 233* 141*     Family Communication  :  None  Code Status :  Full  Diet : Heart Healthy Low Carb  Disposition Plan  :  Stay inpt  Consults  :  Cards  Procedures  :    TTE April 2017 -  Left ventricle: The cavity size was normal. Wall thickness was normal. Systolic function was normal. The estimated ejection fraction was in the range of 50% to 55%. Wall motion was normal;  there were no regional wall motion abnormalities. Features are consistent with a pseudonormal left ventricular filling pattern, with concomitant abnormal relaxation and increased filling  pressure (grade 2 diastolic dysfunction).  Left atrium: The atrium was mildly dilated.  Renal ultrasound. Nonacute.    DVT Prophylaxis  :  Lovenox    Lab Results  Component Value Date   PLT 199 12/18/2015    Inpatient Medications  Scheduled Meds: . amLODipine  10 mg Oral Daily  . aspirin  81 mg Oral Daily  . atorvastatin  40 mg Oral Daily  . cloNIDine  0.3 mg Oral BID  . clopidogrel  75 mg Oral Daily  . enoxaparin (LOVENOX) injection  30 mg Subcutaneous Daily  . ezetimibe  10 mg Oral Daily  . famotidine  20 mg Oral BID  . furosemide  40 mg Intravenous BID  . guaiFENesin  600 mg Oral BID  . insulin aspart  0-9 Units Subcutaneous TID WC  . insulin glargine  15 Units Subcutaneous BID  . isosorbide mononitrate  120 mg Oral Daily  . levothyroxine  25 mcg Oral QAC breakfast  . [START ON 12/20/2015] metoprolol succinate  200 mg Oral Daily  . senna  1 tablet Oral QHS  . sodium chloride flush  3 mL Intravenous Q12H  . sodium polystyrene  30  g Oral Once  . sorbitol, milk of mag, mineral oil, glycerin (SMOG) enema  960 mL Rectal Once  . traZODone  50 mg Oral QHS   Continuous Infusions: . albuterol  Stopped (12/18/15 0115)   PRN Meds:.sodium chloride, acetaminophen, acetaminophen-codeine, clonazePAM, fluticasone, guaiFENesin-dextromethorphan, ipratropium-albuterol, magnesium citrate, nitroGLYCERIN, ondansetron (ZOFRAN) IV, sodium chloride flush  Antibiotics  :    Anti-infectives    None         Objective:   Vitals:   12/18/15 0555 12/18/15 1424 12/18/15 2057 12/19/15 0421  BP: (!) 157/63 (!) 172/81 (!) 166/82 (!) 172/79  Pulse: 79 80 73 74  Resp: 20 18 18 18   Temp: 98.7 F (37.1 C) 98.9 F (37.2 C) 99.1 F (37.3 C) 98.3 F (36.8 C)  TempSrc: Oral Oral Oral Oral  SpO2: 100% 100% 97% 97%  Weight:    113.4 kg (249 lb 14.4 oz)  Height:        Wt Readings from Last 3 Encounters:  12/19/15 113.4 kg (249 lb 14.4 oz)  12/14/15 117 kg (258 lb)  11/30/15 114.5 kg (252 lb 6.4 oz)     Intake/Output Summary (Last 24 hours) at 12/19/15 1030 Last data filed at 12/19/15 0836  Gross per 24 hour  Intake              840 ml  Output             1950 ml  Net            -1110 ml     Physical Exam  Awake Alert, Oriented X 3, No new F.N deficits, Normal affect [place].AT,PERRAL Supple Neck,No JVD, No cervical lymphadenopathy appriciated.  Symmetrical Chest wall movement, Good air movement bilaterally, +ve rales RRR,No Gallops,Rubs or new Murmurs, No Parasternal Heave +ve B.Sounds, Abd Soft, No tenderness, No organomegaly appriciated, No rebound - guarding or rigidity. No Cyanosis, Clubbing , 1+ edema, No new Rash or bruise       Data Review:    CBC  Recent Labs Lab 12/17/15 2249 12/18/15 0336  WBC 13.3* 9.6  HGB 11.4* 9.6*  HCT 35.6* 30.9*  PLT 235 199  MCV 95.4 94.5  MCH 30.6 29.4  MCHC 32.0 31.1  RDW 15.4 15.5  LYMPHSABS  --  0.5*  MONOABS  --  0.5  EOSABS  --  0.1  BASOSABS  --  0.0    Chemistries   Recent Labs Lab 12/17/15 2249 12/19/15 0320  NA 139 138  K 4.0 6.1*  CL 111 107  CO2 17* 24  GLUCOSE 261* 181*  BUN 32* 41*  CREATININE 2.90* 3.03*    CALCIUM 9.7 9.7   ------------------------------------------------------------------------------------------------------------------ No results for input(s): CHOL, HDL, LDLCALC, TRIG, CHOLHDL, LDLDIRECT in the last 72 hours.  Lab Results  Component Value Date   HGBA1C 6.2 11/02/2015   ------------------------------------------------------------------------------------------------------------------ No results for input(s): TSH, T4TOTAL, T3FREE, THYROIDAB in the last 72 hours.  Invalid input(s): FREET3 ------------------------------------------------------------------------------------------------------------------ No results for input(s): VITAMINB12, FOLATE, FERRITIN, TIBC, IRON, RETICCTPCT in the last 72 hours.  Coagulation profile  Recent Labs Lab 12/17/15 2249  INR 0.91    No results for input(s): DDIMER in the last 72 hours.  Cardiac Enzymes No results for input(s): CKMB, TROPONINI, MYOGLOBIN in the last 168 hours.  Invalid input(s): CK ------------------------------------------------------------------------------------------------------------------    Component Value Date/Time   BNP 1,144.8 (H) 12/17/2015 2249    Micro Results No results found for this or any previous visit (from the past 240 hour(s)).  Radiology Reports US Renal  Result Date: 12/18/2015 CLINICAL DATA:  Acute renal failure.  Diabetes and hypertension. EXAM: RENAL / URINARY TRACT ULTRASOUND COMPLETE COMPARISON:  05/20/2012 FINDINGS: Right Kidney: Length: 11.4 cm. Echogenicity within normal limits. No mass or hydronephrosis visualized. Left Kidney: Length: 11.0 cm. Echogenicity within normal limits. No mass or hydronephrosis visualized. Bladder: Appears normal for degree of bladder distention. IMPRESSION: Normal size kidneys.  No evidence of hydronephrosis. Electronically Signed   By: Earle Gell M.D.   On: 12/18/2015 14:20   Dg Chest Portable 1 View  Result Date: 12/17/2015 CLINICAL DATA:  Acute onset  of shortness of breath. Initial encounter. EXAM: PORTABLE CHEST 1 VIEW COMPARISON:  Chest radiograph performed 07/25/2015 FINDINGS: The lungs are well-aerated. Mild bibasilar opacities may reflect pneumonia or pulmonary edema. Mild vascular congestion is noted. No pleural effusion or pneumothorax is seen. The cardiomediastinal silhouette is mildly enlarged. The patient is status post median sternotomy, with evidence of prior CABG. No acute osseous abnormalities are seen. IMPRESSION: Mild bibasilar opacities may reflect pneumonia or pulmonary edema. Mild vascular congestion and mild cardiomegaly noted. Electronically Signed   By: Garald Balding M.D.   On: 12/17/2015 23:20    Time Spent in minutes  30   Cala Kruckenberg K M.D on 12/19/2015 at 10:30 AM  Between 7am to 7pm - Pager - [phone]  After 7pm go to [email or web address] - password [email or web address]  [facility] -  Office  [phone]

## 2015-12-19 NOTE — Progress Notes (Signed)
Subjective: "I feel like I am going to cry.  Havent gotten meds yet"  CP that is pleuritic  Breathign fair  COughing up sputm Some bloody   Objective: Vitals:   12/18/15 0555 12/18/15 1424 12/18/15 2057 12/19/15 0421  BP: (!) 157/63 (!) 172/81 (!) 166/82 (!) 172/79  Pulse: 79 80 73 74  Resp: 20 18 18 18   Temp: 98.7 F (37.1 C) 98.9 F (37.2 C) 99.1 F (37.3 C) 98.3 F (36.8 C)  TempSrc: Oral Oral Oral Oral  SpO2: 100% 100% 97% 97%  Weight:    249 lb 14.4 oz (113.4 kg)  Height:       Weight change: -5 lb 6.4 oz (-2.449 kg)  Intake/Output Summary (Last 24 hours) at 12/19/15 1012 Last data filed at 12/19/15 0836  Gross per 24 hour  Intake              840 ml  Output             1950 ml  Net            -1110 ml    General: Alert, awake, oriented x3, in no acute distress Neck:  JVP is diffiuclt to assess as neck is full  ? Increased   Heart: Regular rate and rhythm, without murmurs, rubs, gallops.  Lungs: Clear to auscultation.  No rales or wheezes. Exemities:  Tr  edema.   Neuro: Grossly intact, nonfocal.  Tele  SR    Lab Results: Results for orders placed or performed during the hospital encounter of 12/17/15 (from the past 24 hour(s))  Glucose, capillary     Status: Abnormal   Collection Time: 12/18/15 11:45 AM  Result Value Ref Range   Glucose-Capillary 199 (H) 65 - 99 mg/dL   Comment 1 Notify RN   Glucose, capillary     Status: Abnormal   Collection Time: 12/18/15  4:59 PM  Result Value Ref Range   Glucose-Capillary 297 (H) 65 - 99 mg/dL  Glucose, capillary     Status: Abnormal   Collection Time: 12/18/15  8:46 PM  Result Value Ref Range   Glucose-Capillary 233 (H) 65 - 99 mg/dL   Comment 1 Notify RN    Comment 2 Document in Chart   Basic metabolic panel     Status: Abnormal   Collection Time: 12/19/15  3:20 AM  Result Value Ref Range   Sodium 138 135 - 145 mmol/L   Potassium 6.1 (H) 3.5 - 5.1 mmol/L   Chloride 107 101 - 111 mmol/L   CO2 24 22 - 32  mmol/L   Glucose, Bld 181 (H) 65 - 99 mg/dL   BUN 41 (H) 6 - 20 mg/dL   Creatinine, Ser 3.03 (H) 0.44 - 1.00 mg/dL   Calcium 9.7 8.9 - 10.3 mg/dL   GFR calc non Af Amer 16 (L) >60 mL/min   GFR calc Af Amer 18 (L) >60 mL/min   Anion gap 7 5 - 15  Glucose, capillary     Status: Abnormal   Collection Time: 12/19/15  6:40 AM  Result Value Ref Range   Glucose-Capillary 141 (H) 65 - 99 mg/dL    Studies/Results: US Renal  Result Date: 12/18/2015 CLINICAL DATA:  Acute renal failure.  Diabetes and hypertension. EXAM: RENAL / URINARY TRACT ULTRASOUND COMPLETE COMPARISON:  05/20/2012 FINDINGS: Right Kidney: Length: 11.4 cm. Echogenicity within normal limits. No mass or hydronephrosis visualized. Left Kidney: Length: 11.0 cm. Echogenicity within normal limits. No mass or hydronephrosis  visualized. Bladder: Appears normal for degree of bladder distention. IMPRESSION: Normal size kidneys.  No evidence of hydronephrosis. Electronically Signed   By: Earle Gell M.D.   On: 12/18/2015 14:20    Medications:Reviewed    @PROBHOSP @ 1  Acute on chronic systolic/diastolic CHF  Pt has not diuresed much   Cr has bumped  It would be important to contact renal who follows pt as outpt    2. CAD  S/p CABG in 2002  PCI/STent to [facility] 2015.  Cath in 2016 due to worsening LVEF (35 to 40%)  SVG to ramus occluded  Underwent DES to ramus   CP admit in April  LVEF 50 to 55%   Current CP is pleuritic    3  CKD  Pt followed by Dr Lorrene Reid as outpt    Repeat K  Hemolyzed sample this AM    4.  PVOD    5  HTN  BP is high  She has not gotten meds today    LOS: 1 day   Dorris Carnes 12/19/2015, 10:12 AM

## 2015-12-19 NOTE — Progress Notes (Signed)
  Echocardiogram 2D Echocardiogram has been performed.  Brooke Stafford 12/19/2015, 12:16 PM

## 2015-12-20 ENCOUNTER — Telehealth: Payer: Self-pay | Admitting: *Deleted

## 2015-12-20 LAB — GLUCOSE, CAPILLARY
Glucose-Capillary: 208 mg/dL — ABNORMAL HIGH (ref 65–99)
Glucose-Capillary: 181 mg/dL — ABNORMAL HIGH (ref 65–99)
Glucose-Capillary: 191 mg/dL — ABNORMAL HIGH (ref 65–99)

## 2015-12-20 LAB — BASIC METABOLIC PANEL
ANION GAP: 12 (ref 5–15)
BUN: 37 mg/dL — ABNORMAL HIGH (ref 6–20)
CO2: 26 mmol/L (ref 22–32)
Calcium: 9.1 mg/dL (ref 8.9–10.3)
Chloride: 101 mmol/L (ref 101–111)
Creatinine, Ser: 3.25 mg/dL — ABNORMAL HIGH (ref 0.44–1.00)
GFR calc Af Amer: 17 mL/min — ABNORMAL LOW (ref >60)
GFR, EST NON AFRICAN AMERICAN: 14 mL/min — AB (ref >60)
GLUCOSE: 195 mg/dL — AB (ref 65–99)
POTASSIUM: 4.3 mmol/L (ref 3.5–5.1)
Sodium: 139 mmol/L (ref 135–145)

## 2015-12-20 LAB — IRON AND TIBC
IRON: 61 ug/dL (ref 28–170)
SATURATION RATIOS: 19 % (ref 10.4–31.8)
TIBC: 323 ug/dL (ref 250–450)
UIBC: 262 ug/dL

## 2015-12-20 LAB — FERRITIN: Ferritin: 68 ng/mL (ref 11–307)

## 2015-12-20 MED ORDER — CLONIDINE HCL 0.3 MG PO TABS
0.3000 mg | ORAL_TABLET | Freq: Three times a day (TID) | ORAL | Status: DC
  Administered 2015-12-20 – 2015-12-21 (×4): 0.3 mg via ORAL
  Filled 2015-12-20 (×4): qty 1

## 2015-12-20 MED ORDER — FUROSEMIDE 80 MG PO TABS
80.0000 mg | ORAL_TABLET | Freq: Two times a day (BID) | ORAL | Status: DC
  Administered 2015-12-20 – 2015-12-22 (×4): 80 mg via ORAL
  Filled 2015-12-20 (×4): qty 1

## 2015-12-20 NOTE — Progress Notes (Signed)
Patient is currently on [facility] with sats of 99%. Patient is in no distress and vitals are stable. BIPAP is not needed at this time. Will continue to monitor.

## 2015-12-20 NOTE — Progress Notes (Signed)
Subjective: Denies CP  Breathing is fair   Objective: Vitals:   12/18/15 2057 12/19/15 0421 12/19/15 2019 12/20/15 0449  BP: (!) 166/82 (!) 172/79 (!) 163/74 (!) 176/80  Pulse: 73 74 73 72  Resp: 18 18 18 18   Temp: 99.1 F (37.3 C) 98.3 F (36.8 C) 98.8 F (37.1 C) 98.6 F (37 C)  TempSrc: Oral Oral Oral Oral  SpO2: 97% 97% 100% 100%  Weight:  249 lb 14.4 oz (113.4 kg)  248 lb 1.6 oz (112.5 kg)  Height:       Weight change: -1 lb 12.8 oz (-0.816 kg)  Intake/Output Summary (Last 24 hours) at 12/20/15 0905 Last data filed at 12/20/15 0507  Gross per 24 hour  Intake              240 ml  Output             1300 ml  Net            -1060 ml   I/O  Net 2.94 L negative  General: Alert, awake, oriented x3, in no acute distress Neck:  JVP is normal Heart: Regular rate and rhythm, without murmurs, rubs, gallops.  Lungs: Clear to auscultation.  No rales or wheezes. Exemities:  Tr-1+ edema.   Neuro: Grossly intact, nonfocal.   Lab Results: Results for orders placed or performed during the hospital encounter of 12/17/15 (from the past 24 hour(s))  Potassium     Status: None   Collection Time: 12/19/15 12:09 PM  Result Value Ref Range   Potassium 4.5 3.5 - 5.1 mmol/L  Basic metabolic panel     Status: Abnormal   Collection Time: 12/19/15 12:09 PM  Result Value Ref Range   Sodium 139 135 - 145 mmol/L   Potassium 4.4 3.5 - 5.1 mmol/L   Chloride 104 101 - 111 mmol/L   CO2 27 22 - 32 mmol/L   Glucose, Bld 244 (H) 65 - 99 mg/dL   BUN 34 (H) 6 - 20 mg/dL   Creatinine, Ser 3.14 (H) 0.44 - 1.00 mg/dL   Calcium 10.0 8.9 - 10.3 mg/dL   GFR calc non Af Amer 15 (L) >60 mL/min   GFR calc Af Amer 17 (L) >60 mL/min   Anion gap 8 5 - 15  Glucose, capillary     Status: Abnormal   Collection Time: 12/19/15 12:09 PM  Result Value Ref Range   Glucose-Capillary 233 (H) 65 - 99 mg/dL  Glucose, capillary     Status: Abnormal   Collection Time: 12/19/15  5:06 PM  Result Value Ref Range   Glucose-Capillary 249 (H) 65 - 99 mg/dL  Glucose, capillary     Status: Abnormal   Collection Time: 12/19/15  9:29 PM  Result Value Ref Range   Glucose-Capillary 216 (H) 65 - 99 mg/dL   Comment 1 Notify RN    Comment 2 Document in Chart   Basic metabolic panel     Status: Abnormal   Collection Time: 12/20/15  2:58 AM  Result Value Ref Range   Sodium 139 135 - 145 mmol/L   Potassium 4.3 3.5 - 5.1 mmol/L   Chloride 101 101 - 111 mmol/L   CO2 26 22 - 32 mmol/L   Glucose, Bld 195 (H) 65 - 99 mg/dL   BUN 37 (H) 6 - 20 mg/dL   Creatinine, Ser 3.25 (H) 0.44 - 1.00 mg/dL   Calcium 9.1 8.9 - 10.3 mg/dL   GFR calc non Af  Amer 14 (L) >60 mL/min   GFR calc Af Amer 17 (L) >60 mL/min   Anion gap 12 5 - 15  Glucose, capillary     Status: Abnormal   Collection Time: 12/20/15  5:52 AM  Result Value Ref Range   Glucose-Capillary 208 (H) 65 - 99 mg/dL    Studies/Results: No results found.  Medications:REviewed   @PROBHOSP @  1  Acute on chronic systolic/diastolic CHF  Renal service has been contacted re further recomm with bump in Cr  2  CAD  CABG in 2002  PCI/Stent 2015, 2016  No symptoms to sugg acitve angina  3  CKD  As above  4  HTN  BP is high  May not have gotten meds  Would like to see renal recomm on this as well with renal dysfunctioin.   LOS: 2 days   Dorris Carnes 12/20/2015, 9:05 AM

## 2015-12-20 NOTE — Progress Notes (Signed)
PROGRESS NOTE                                                                                                                                                                                                             Patient Demographics:    Brooke Stafford, is a 60 y.o. female, DOB - [birth year], [id number]  Admit date - 12/17/2015   Admitting Physician Norval Morton, MD  Outpatient Primary MD for the patient is Angelica Chessman, MD  LOS - 2  Chief Complaint  Patient presents with  . Respiratory Distress       Brief Narrative    Brooke Stafford is a 60 y.o. female with medical history significant of diastolic CHF, CAD s/p CABG, asthma/COPD, CVA in7/2017 with residual right-sided weakness, and anxiety; who presents with acute onset of shortness of breath. She expresses that for the last week she noticed that she was wheezing when she laid down at night. For the last 2 days, she complained of significant constipation for which she tried to use milk of magnesia without relief of symptoms. While straining trying to have a bowel movement yesterday evening she developed acute onset of shortness of breath.  Associated symptoms include a dry cough, abdominal distention, chills, and falls. She immediately called EMS who advised her to try her albuterol nebulizer machine, and subsequently nitroglycerin without relief of symptoms. She notes over the last week that she's gained approximately 6 - 8 pounds, and 26 pounds over the last year. She just received her flu vaccine last month. Denies any headache, hemoptysis, blood in stool/urine, or dysuria.   ED Course: Upon EMS arrival, the patient was placed on CPAP, but continued to have significant respiratory distress. On arrival to the emergency department they were in the process of preparing the patient for intubation for protection of airway. However, the patient was switched to  BiPAP with improvement in respiratory status and did not require intubation. Now on nasal cannula oxygen although patient does not use oxygen at home. Initial lab work revealed elevated BNP of 1144 and imaging studies suggest pulmonary edema. Patient was given 80 mg of Lasix IV.  [facility] called to admit.    Subjective:    Brooke Stafford today has, No headache, No chest pain, No abdominal pain - No Nausea, No new weakness tingling or numbness, No  Cough - Improved but +ve SOB.   Assessment  & Plan :     1.Acute on chronic diastolic CHF EF [id number] on echocardiogram in April 2017. Continue IV Lasix, is showing improvement, continue Imdur, continue beta blocker, oxygen nebulizer treatments as needed, salt and fluid restriction, monitor intake and output and weight. Cardiology consulted. SOB much better, - 3 Lit  Filed Weights   12/18/15 0227 12/19/15 0421 12/20/15 0449  Weight: 115.8 kg (255 lb 4.8 oz) 113.4 kg (249 lb 14.4 oz) 112.5 kg (248 lb 1.6 oz)    2. Acute hypoxic respiratory failure due to #1 above. Treatment as above  3. ARF on CK D stage III with Hyperkalemia. Baseline creatinine close to 1.5-2, worse due to CHF decompensation, calcium stable after hyperkalemia protocol, renal ultrasound was nonacute, she does follow with Dr. Jamal Maes on the outpatient setting, we'll call cardiology to assist as creatinine is climbing despite adequate diuresis.  4. Essential hypertension. He on combination of Norvasc, clonidine, beta blocker, diuretics will monitor and adjust.Have increase clonidine from twice a day to 3 times a day for better control.  5. CAD status post CABG. Chest pain-free, no acute issues, continue aspirin, Plavix, statin along with beta blocker for secondary prevention.   6.Hypothyroidism. Continue home dose Synthroid TSH was stable.  7. Dyslipidemia. On statin.  8. Anxiety. Stable on on up and as needed.  9. DM type II. On Lantus and sliding scale monitor closely.  Lab  Results  Component Value Date   HGBA1C 6.2 11/02/2015   CBG (last 3)   Recent Labs  12/19/15 1706 12/19/15 2129 12/20/15 0552  GLUCAP 249* 216* 208*     Family Communication  :  None  Code Status :  Full  Diet : Heart Healthy Low Carb, 1.5 L total fluid restriction  Disposition Plan  :  Stay inpt  Consults  :  Cards, Renal  Procedures  :    TTE April 2017 -  Left ventricle: The cavity size was normal. Wall thickness was normal. Systolic function was normal. The estimated ejection fraction was in the range of 50% to 55%. Wall motion was normal;  there were no regional wall motion abnormalities. Features are consistent with a pseudonormal left ventricular filling pattern, with concomitant abnormal relaxation and increased filling  pressure (grade 2 diastolic dysfunction).  Left atrium: The atrium was mildly dilated.  Renal ultrasound. Nonacute.    DVT Prophylaxis  :  Lovenox    Lab Results  Component Value Date   PLT 199 12/18/2015    Inpatient Medications  Scheduled Meds: . amLODipine  10 mg Oral Daily  . aspirin  81 mg Oral Daily  . atorvastatin  40 mg Oral Daily  . cloNIDine  0.3 mg Oral BID  . clopidogrel  75 mg Oral Daily  . enoxaparin (LOVENOX) injection  30 mg Subcutaneous Daily  . ezetimibe  10 mg Oral Daily  . famotidine  20 mg Oral BID  . furosemide  40 mg Intravenous BID  . guaiFENesin  600 mg Oral BID  . insulin aspart  0-9 Units Subcutaneous TID WC  . insulin glargine  15 Units Subcutaneous BID  . isosorbide mononitrate  120 mg Oral Daily  . levothyroxine  25 mcg Oral QAC breakfast  . metoprolol succinate  200 mg Oral QPC breakfast  . senna  1 tablet Oral QHS  . sorbitol, milk of mag, mineral oil, glycerin (SMOG) enema  960 mL Rectal Once  . traZODone  50 mg Oral QHS   Continuous Infusions: . albuterol Stopped (12/18/15 0115)   PRN Meds:.acetaminophen, acetaminophen-codeine, clonazePAM, fluticasone, guaiFENesin-dextromethorphan,  ipratropium-albuterol, nitroGLYCERIN, ondansetron (ZOFRAN) IV  Antibiotics  :    Anti-infectives    None         Objective:   Vitals:   12/18/15 2057 12/19/15 0421 12/19/15 2019 12/20/15 0449  BP: (!) 166/82 (!) 172/79 (!) 163/74 (!) 176/80  Pulse: 73 74 73 72  Resp: 18 18 18 18   Temp: 99.1 F (37.3 C) 98.3 F (36.8 C) 98.8 F (37.1 C) 98.6 F (37 C)  TempSrc: Oral Oral Oral Oral  SpO2: 97% 97% 100% 100%  Weight:  113.4 kg (249 lb 14.4 oz)  112.5 kg (248 lb 1.6 oz)  Height:        Wt Readings from Last 3 Encounters:  12/20/15 112.5 kg (248 lb 1.6 oz)  12/14/15 117 kg (258 lb)  11/30/15 114.5 kg (252 lb 6.4 oz)     Intake/Output Summary (Last 24 hours) at 12/20/15 0934 Last data filed at 12/20/15 0507  Gross per 24 hour  Intake              240 ml  Output             1300 ml  Net            -1060 ml     Physical Exam  Awake Alert, Oriented X 3, No new F.N deficits, Normal affect [place].AT,PERRAL Supple Neck,No JVD, No cervical lymphadenopathy appriciated.  Symmetrical Chest wall movement, Good air movement bilaterally, +ve rales RRR,No Gallops,Rubs or new Murmurs, No Parasternal Heave +ve B.Sounds, Abd Soft, No tenderness, No organomegaly appriciated, No rebound - guarding or rigidity. No Cyanosis, Clubbing , 1+ edema, No new Rash or bruise       Data Review:    CBC  Recent Labs Lab 12/17/15 2249 12/18/15 0336  WBC 13.3* 9.6  HGB 11.4* 9.6*  HCT 35.6* 30.9*  PLT 235 199  MCV 95.4 94.5  MCH 30.6 29.4  MCHC 32.0 31.1  RDW 15.4 15.5  LYMPHSABS  --  0.5*  MONOABS  --  0.5  EOSABS  --  0.1  BASOSABS  --  0.0    Chemistries   Recent Labs Lab 12/17/15 2249 12/19/15 0320 12/19/15 1209 12/20/15 0258  NA 139 138 139 139  K 4.0 6.1* 4.5  4.4 4.3  CL 111 107 104 101  CO2 17* 24 27 26   GLUCOSE 261* 181* 244* 195*  BUN 32* 41* 34* 37*  CREATININE 2.90* 3.03* 3.14* 3.25*  CALCIUM 9.7 9.7 10.0 9.1    ------------------------------------------------------------------------------------------------------------------ No results for input(s): CHOL, HDL, LDLCALC, TRIG, CHOLHDL, LDLDIRECT in the last 72 hours.  Lab Results  Component Value Date   HGBA1C 6.2 11/02/2015   ------------------------------------------------------------------------------------------------------------------ No results for input(s): TSH, T4TOTAL, T3FREE, THYROIDAB in the last 72 hours.  Invalid input(s): FREET3 ------------------------------------------------------------------------------------------------------------------ No results for input(s): VITAMINB12, FOLATE, FERRITIN, TIBC, IRON, RETICCTPCT in the last 72 hours.  Coagulation profile  Recent Labs Lab 12/17/15 2249  INR 0.91    No results for input(s): DDIMER in the last 72 hours.  Cardiac Enzymes No results for input(s): CKMB, TROPONINI, MYOGLOBIN in the last 168 hours.  Invalid input(s): CK ------------------------------------------------------------------------------------------------------------------    Component Value Date/Time   BNP 1,144.8 (H) 12/17/2015 2249    Micro Results No results found for this or any previous visit (from the past 240 hour(s)).  Radiology Reports US Renal  Result Date: 12/18/2015 CLINICAL DATA:  Acute renal failure.  Diabetes and hypertension. EXAM: RENAL / URINARY TRACT ULTRASOUND COMPLETE COMPARISON:  05/20/2012 FINDINGS: Right Kidney: Length: 11.4 cm. Echogenicity within normal limits. No mass or hydronephrosis visualized. Left Kidney: Length: 11.0 cm. Echogenicity within normal limits. No mass or hydronephrosis visualized. Bladder: Appears normal for degree of bladder distention. IMPRESSION: Normal size kidneys.  No evidence of hydronephrosis. Electronically Signed   By: Earle Gell M.D.   On: 12/18/2015 14:20   Dg Chest Portable 1 View  Result Date: 12/17/2015 CLINICAL DATA:  Acute onset of shortness of  breath. Initial encounter. EXAM: PORTABLE CHEST 1 VIEW COMPARISON:  Chest radiograph performed 07/25/2015 FINDINGS: The lungs are well-aerated. Mild bibasilar opacities may reflect pneumonia or pulmonary edema. Mild vascular congestion is noted. No pleural effusion or pneumothorax is seen. The cardiomediastinal silhouette is mildly enlarged. The patient is status post median sternotomy, with evidence of prior CABG. No acute osseous abnormalities are seen. IMPRESSION: Mild bibasilar opacities may reflect pneumonia or pulmonary edema. Mild vascular congestion and mild cardiomegaly noted. Electronically Signed   By: Garald Balding M.D.   On: 12/17/2015 23:20    Time Spent in minutes  30   Lakeva Hollon K M.D on 12/20/2015 at 9:34 AM  Between 7am to 7pm - Pager - [phone]  After 7pm go to [email or web address] - password [facility]  [facility] -  Office  [phone]

## 2015-12-20 NOTE — Progress Notes (Signed)
Pt. Alert and oriented, requesting not to have bed alarm at this time. Patient fall safety prevention discussed with pt. Pt. Verbalized understanding. Pt. Stated she will call for assistance if needed. RN will continue to monitor pt.

## 2015-12-20 NOTE — Telephone Encounter (Signed)
Lab results received from [place] (collected on 12/13/15):  CREATININE: 3.00  INTACT PTH: PTH: 126  CBC: HGB: 9.4  FE/TIBC: IRON BIND. CAP.: 273 UIBC: 213

## 2015-12-20 NOTE — Consult Note (Signed)
Reason for Consult: AKI/CKD Referring Physician: Candiss Norse, MD  Brooke Stafford is an 60 y.o. female.  HPI: Pt is a 60yo AAF with PMH sig for CAD s/p CABG, as well as DES to SVG-Dia, HTN, CHF (EF 35-40%), PVD, DM, and CKD stage 3-4 with baseline Scr of 2.7-3 who was admitted on 12/17/15 with worsening SOB and wheezing.  She was placed on CPAP without improvement but did respond to BiPap and diuresis, however her Scr has continued to rise since admission.  We were asked to help evaluate and manage her AKI/CKD.  She is well known to our service and has been followed in our office for several years.  The trend in Scr is seen below.   Since admission her respiratory and volume status have improved.  She did have an episode of AKI/CKD in April 2017 in setting of decompensated CHF with concomitant ACE-inhibition.  Her lisinopril was discontinued with some improvement of Scr however her BP's have been poorly controlled.  Trend in Creatinine: Creatinine, Ser  Date/Time Value Ref Range Status  12/20/2015 02:58 AM 3.25 (H) 0.44 - 1.00 mg/dL Final  12/19/2015 12:09 PM 3.14 (H) 0.44 - 1.00 mg/dL Final  12/19/2015 03:20 AM 3.03 (H) 0.44 - 1.00 mg/dL Final  12/17/2015 10:49 PM 2.90 (H) 0.44 - 1.00 mg/dL Final  10/22/2015 11:01 AM 2.46 (H) 0.44 - 1.00 mg/dL Final  08/24/2015 04:41 PM 2.36 (H) 0.50 - 0.99 mg/dL Final  07/28/2015 03:02 AM 2.74 (H) 0.44 - 1.00 mg/dL Final  07/27/2015 02:32 AM 2.72 (H) 0.44 - 1.00 mg/dL Final  07/26/2015 03:26 AM 2.54 (H) 0.44 - 1.00 mg/dL Final  07/25/2015 11:25 AM 2.37 (H) 0.44 - 1.00 mg/dL Final  12/15/2014 04:24 AM 1.61 (H) 0.44 - 1.00 mg/dL Final  12/14/2014 03:30 AM 1.68 (H) 0.44 - 1.00 mg/dL Final  12/13/2014 02:45 AM 1.73 (H) 0.44 - 1.00 mg/dL Final  08/08/2014 02:45 PM 1.36 (H) 0.57 - 1.00 mg/dL Final  01/25/2014 01:09 PM 1.38 (H) 0.50 - 1.10 mg/dL Final  11/09/2013 04:20 PM 2.03 (H) 0.50 - 1.10 mg/dL Final  11/09/2013 03:50 AM 1.81 (H) 0.50 - 1.10 mg/dL Final  11/08/2013  04:18 PM 1.35 (H) 0.50 - 1.10 mg/dL Final  09/29/2013 03:40 AM 1.21 (H) 0.50 - 1.10 mg/dL Final  09/28/2013 05:50 AM 1.33 (H) 0.50 - 1.10 mg/dL Final  09/27/2013 01:45 PM 1.32 (H) 0.50 - 1.10 mg/dL Final  08/04/2013 04:30 AM 1.73 (H) 0.50 - 1.10 mg/dL Final  08/03/2013 10:31 PM 1.73 (H) 0.50 - 1.10 mg/dL Final  12/02/2012 05:00 AM 1.44 (H) 0.50 - 1.10 mg/dL Final  12/01/2012 07:10 AM 1.36 (H) 0.50 - 1.10 mg/dL Final  07/13/2012 06:52 PM 1.37 (H) 0.50 - 1.10 mg/dL Final  04/06/2011 03:30 AM 1.32 (H) 0.50 - 1.10 mg/dL Final  04/05/2011 11:38 AM 1.40 (H) 0.50 - 1.10 mg/dL Final  01/09/2011 05:40 AM 1.27 (H) 0.50 - 1.10 mg/dL Final  01/08/2011 05:45 AM 1.33 (H) 0.50 - 1.10 mg/dL Final  01/06/2011 01:20 AM 1.35 (H) 0.50 - 1.10 mg/dL Final  01/05/2011 09:47 PM 1.31 (H) 0.50 - 1.10 mg/dL Final  01/05/2011 05:37 PM 1.38 (H) 0.50 - 1.10 mg/dL Final  02/19/2010 09:14 PM 1.38 (H) 0.40 - 1.20 mg/dL Final  11/27/2009 09:22 PM 1.31 (H) 0.40 - 1.20 mg/dL Final  09/11/2009 05:10 AM 1.50 (H) 0.4 - 1.2 mg/dL Final  09/10/2009 06:46 AM 1.58 (H) 0.4 - 1.2 mg/dL Final  09/09/2009 05:15 AM 1.75 (H) 0.4 - 1.2 mg/dL  Final  09/08/2009 04:00 AM 1.54 (H) 0.4 - 1.2 mg/dL Final  09/07/2009 04:00 AM 1.66 (H) 0.4 - 1.2 mg/dL Final  09/06/2009 04:00 AM 1.88 (H) 0.4 - 1.2 mg/dL Final  09/05/2009 04:21 AM 2.22 (H) 0.4 - 1.2 mg/dL Final  09/04/2009 04:21 AM 2.56 (H) 0.4 - 1.2 mg/dL Final  09/03/2009 06:00 AM 2.53 (H) 0.4 - 1.2 mg/dL Final  09/03/2009 03:02 AM 1.7 (H) 0.4 - 1.2 mg/dL Final  09/02/2009 11:52 PM 1.4 (H) 0.4 - 1.2 mg/dL Final  09/02/2009 11:23 PM 1.63 (H) 0.4 - 1.2 mg/dL Final  03/15/2008 09:03 PM 1.06 0.40 - 1.20 mg/dL Final  10/11/2007 06:49 PM 1.3 (H)  Final  05/13/2007 02:04 PM 1.81 (H)  Final  05/01/2007 09:32 PM 1.18 0.40 - 1.20 mg/dL Final  03/04/2007 08:12 PM 1.21 (H) 0.40 - 1.20 mg/dL Final    PMH:   Past Medical History:  Diagnosis Date  . Anemia   . Anginal pain ([place])   . Anxiety   .  Arthritis    "stiff fingers and knees" (08/04/2013), (12/12/2014)  . Asthma   . CAD (coronary artery disease) 2002; 2015   CABG x 6 2002, cath 2011- med Rx stent DES VG-Diag  . CAD (coronary artery disease) of artery bypass graft; DES to VG-Diag 09/28/13 11/09/2013  . Cataract   . CHF (congestive heart failure) ([place])    "in 2002" (11/26/2012)  . Chronic bronchitis ([place])    "q year; in the winter"   . Chronic renal insufficiency, stage II (mild)    followed  by [facility]  . Coronary artery disease 2002   CABG x 6. Cath 5/11- med Rx  . GERD (gastroesophageal reflux disease)   . Gout    "right big toe"  . Headache    "~ q week" (08/04/2013); "~ twice/month" (12/12/2014)  . History of blood transfusion 2002   "when I had OHS"  . Hyperlipidemia   . Hypertension   . Hypothyroid    treated  . Migraines    "couple times/year" (08/04/2013), (12/12/2014)  . Myocardial infarction ([place]) 2000; 2002; 2011  . Obesity (BMI 35.0-39.9 without comorbidity) ([place])   . Peripheral vascular disease ([place]) 12/12   LSFA PTA  . Pneumonia    "3 times I think" (12/12/2014)  . Type II diabetes mellitus ([facility])     PSH:   Past Surgical History:  Procedure Laterality Date  . ABDOMINAL AORTAGRAM N/A 04/05/2011   Procedure: ABDOMINAL AORTAGRAM;  Surgeon: Lorretta Harp, MD;  Location: [facility] CATH LAB;  Service: Cardiovascular;  Laterality: N/A;  . APPENDECTOMY  1980  . BREAST CYST EXCISION Right 1970's  . CARDIAC CATHETERIZATION  2002  . CARDIAC CATHETERIZATION N/A 12/12/2014   Procedure: Left Heart Cath and Cors/Grafts Angiography;  Surgeon: Peter M Martinique, MD;  Location: [facility] CV LAB;  Service: Cardiovascular;  Laterality: N/A;  . CARDIAC CATHETERIZATION N/A 12/12/2014   Procedure: Coronary Stent Intervention;  Surgeon: Peter M Martinique, MD;  Location: [facility] CV LAB;  Service: Cardiovascular;  Laterality: N/A;  . [place]; 1980  . CHOLECYSTECTOMY  1982  . CORONARY ANGIOPLASTY WITH  STENT PLACEMENT  2004; 2012   "I have 2 stents" (08/04/2013)  . CORONARY ANGIOPLASTY WITH STENT PLACEMENT  09/28/13   PTCA/ DES Xience stent to VG-Diag   . CORONARY ARTERY BYPASS GRAFT  11/20/2000   x6 LIMA to distal LAD, svg to first diag, svg to ramus intermediate branch and swquential SVG to  cir marginal branch, SVG to posterior descending coronary and sequential SVG to first right posterolateral branch  . LEFT HEART CATHETERIZATION WITH CORONARY/GRAFT ANGIOGRAM N/A 09/28/2013   Procedure: LEFT HEART CATHETERIZATION WITH Beatrix Fetters;  Surgeon: Peter M Martinique, MD;  Location: [facility] CATH LAB;  Service: Cardiovascular;  Laterality: N/A;  . LOWER EXTREMITY ANGIOGRAM  12/01/2012   Procedure: LOWER EXTREMITY ANGIOGRAM;  Surgeon: Lorretta Harp, MD;  Location: [facility] CATH LAB;  Service: Cardiovascular;;  . NM MYOCAR PERF WALL MOTION  08/27/2004   negative  . PERCUTANEOUS STENT INTERVENTION Left 12/01/2012   Procedure: PERCUTANEOUS STENT INTERVENTION;  Surgeon: Lorretta Harp, MD;  Location[facility] CATH LAB;  Service: Cardiovascular;  Laterality: Left;  Left SFA  . PERIPHERAL ARTERIAL STENT GRAFT Left    SFA/notes 04/07/2011 (11/30/2012)  . RENAL ANGIOGRAM N/A 04/05/2011   Procedure: RENAL ANGIOGRAM;  Surgeon: Lorretta Harp, MD;  Location: [facility] CATH LAB;  Service: Cardiovascular;  Laterality: N/A;  . TUBAL LIGATION  1980    Allergies:  Allergies  Allergen Reactions  . Digoxin And Related Other (See Comments)    Patient stated she almost died. Had flu like symptoms as well as diarrhea.  . Hydralazine Shortness Of Breath  . Lisinopril Other (See Comments)    Felt like she had the flu. Was very sick!!!  . Penicillins Cross Reactors Hives    And high fever  . Adhesive [Tape] Rash    bruising    Medications:   Prior to Admission medications   Medication Sig Start Date End Date Taking? Authorizing Provider  acetaminophen-codeine (TYLENOL #3) 300-30 MG tablet Take 1 tablet by mouth every 4  (four) hours as needed for moderate pain. 08/24/15  Yes Tresa Garter, MD  albuterol (PROVENTIL HFA;VENTOLIN HFA) 108 (90 Base) MCG/ACT inhaler Inhale 2 puffs into the lungs every 6 (six) hours as needed for wheezing or shortness of breath. 09/28/15  Yes Tresa Garter, MD  allopurinol (ZYLOPRIM) 300 MG tablet Take 1 tablet (300 mg total) by mouth daily. 07/28/15  Yes Ripudeep Krystal Eaton, MD  amLODipine (NORVASC) 10 MG tablet take 1 tablet by mouth once daily Patient taking differently: Take 10 mg by mouth daily 11/20/15  Yes Mihai Croitoru, MD  aspirin 81 MG chewable tablet Chew 1 tablet (81 mg total) by mouth daily. 02/24/15  Yes Mihai Croitoru, MD  atorvastatin (LIPITOR) 40 MG tablet Take 1 tablet (40 mg total) by mouth daily. 07/28/15  Yes Ripudeep Krystal Eaton, MD  clonazePAM (KLONOPIN) 0.5 MG tablet Take 1 tablet (0.5 mg total) by mouth 2 (two) times daily as needed for anxiety. Patient taking differently: Take 0.25 mg by mouth 2 (two) times daily as needed for anxiety.  11/30/15 12/30/15 Yes Olugbemiga Essie Christine, MD  cloNIDine (CATAPRES) 0.3 MG tablet Take 1 tablet (0.3 mg total) by mouth 2 (two) times daily. 11/02/15  Yes Tresa Garter, MD  clopidogrel (PLAVIX) 75 MG tablet Take 1 tablet (75 mg total) by mouth daily. 09/25/15  Yes Mihai Croitoru, MD  ezetimibe (ZETIA) 10 MG tablet Take 1 tablet (10 mg total) by mouth daily. 07/28/15  Yes Ripudeep Krystal Eaton, MD  fluticasone (FLONASE) 50 MCG/ACT nasal spray Place 2 sprays into both nostrils daily as needed for allergies or rhinitis. 09/28/15  Yes Tresa Garter, MD  furosemide (LASIX) 40 MG tablet Take 1 tablet (40 mg total) by mouth daily. 07/28/15  Yes Ripudeep Krystal Eaton, MD  gabapentin (NEURONTIN) 400 MG capsule take 1 capsule by  mouth three times a day Patient taking differently: Take 400 mg by mouth 3 times daily 02/16/15  Yes Olugbemiga E Doreene Burke, MD  insulin aspart (NOVOLOG FLEXPEN) 100 UNIT/ML FlexPen Inject 10-30 units into the skin 3 times daily,  plus sliding scale as instructed. 12/14/15  Yes Philemon Kingdom, MD  Insulin Glargine (TOUJEO SOLOSTAR) 300 UNIT/ML SOPN Inject 40 Units into the skin at bedtime. 09/13/15  Yes Philemon Kingdom, MD  isosorbide mononitrate (IMDUR) 120 MG 24 hr tablet Take 1 tablet (120 mg total) by mouth daily. 07/28/15  Yes Ripudeep Krystal Eaton, MD  levothyroxine (SYNTHROID, LEVOTHROID) 25 MCG tablet take 1 tablet by mouth every morning BEFORE BREAKFAST Patient taking differently: Take 25 mcg by mouth every morning 11/24/15  Yes Olugbemiga E Doreene Burke, MD  loratadine (CLARITIN) 10 MG tablet Take 1 tablet (10 mg total) by mouth daily as needed for allergies. 12/15/14  Yes Luke K Kilroy, PA-C  magnesium citrate SOLN Take 296 mLs (1 Bottle total) by mouth as needed for severe constipation. 07/28/15  Yes Ripudeep Krystal Eaton, MD  metoprolol succinate (TOPROL-XL) 50 MG 24 hr tablet Take 3 tablets (150 mg total) by mouth daily. Take with or immediately following a meal. 07/28/15  Yes Ripudeep K Rai, MD  Naphazoline HCl (CLEAR EYES OP) Place 1 drop into both eyes as needed (for dry eyes).   Yes Historical Provider, MD  nitroGLYCERIN (NITROSTAT) 0.4 MG SL tablet Place 1 tablet (0.4 mg total) under the tongue every 5 (five) minutes as needed. For chest 11/10/14  Yes Tresa Garter, MD  traZODone (DESYREL) 50 MG tablet Take 1 tablet (50 mg total) by mouth at bedtime. Patient taking differently: Take 50 mg by mouth at bedtime as needed for sleep.  09/28/15  Yes Tresa Garter, MD  triamcinolone ointment (KENALOG) 0.5 % Apply 1 application topically 2 (two) times daily. Patient taking differently: Apply 1 application topically daily as needed (for skin).  08/24/15  Yes Tresa Garter, MD  Insulin Pen Needle 31G X 5 MM MISC Use to inject insulin 4 times daily as instructed. Patient not taking: Reported on 12/18/2015 02/16/15   Philemon Kingdom, MD  Insulin Syringes, Disposable, (B-D INSULIN SYRINGE 1CC) U-100 1 ML MISC Use to inject insulin 4  times daily as instructed. Patient not taking: Reported on 12/18/2015 02/22/14   Philemon Kingdom, MD  ranitidine (ZANTAC 75) 75 MG tablet Take 1 tablet (75 mg total) by mouth 2 (two) times daily. Patient taking differently: Take 75 mg by mouth 2 (two) times daily as needed for heartburn.  07/28/15   Ripudeep Krystal Eaton, MD    Inpatient medications: . amLODipine  10 mg Oral Daily  . aspirin  81 mg Oral Daily  . atorvastatin  40 mg Oral Daily  . cloNIDine  0.3 mg Oral TID  . clopidogrel  75 mg Oral Daily  . enoxaparin (LOVENOX) injection  30 mg Subcutaneous Daily  . ezetimibe  10 mg Oral Daily  . famotidine  20 mg Oral BID  . furosemide  40 mg Intravenous BID  . guaiFENesin  600 mg Oral BID  . insulin aspart  0-9 Units Subcutaneous TID WC  . insulin glargine  15 Units Subcutaneous BID  . isosorbide mononitrate  120 mg Oral Daily  . levothyroxine  25 mcg Oral QAC breakfast  . metoprolol succinate  200 mg Oral QPC breakfast  . senna  1 tablet Oral QHS  . sorbitol, milk of mag, mineral oil, glycerin (SMOG) enema  960 mL Rectal Once  . traZODone  50 mg Oral QHS    Discontinued Meds:   Medications Discontinued During This Encounter  Medication Reason  . nitroGLYCERIN 50 mg in dextrose 5 % 250 mL (0.2 mg/mL) infusion   . rocuronium (ZEMURON) injection 117 mg   . etomidate (AMIDATE) injection 20 mg   . Menthol-Camphor (TIGER BALM ARTHRITIS RUB) 11-11 % CREA Patient has not taken in last 30 days  . insulin glargine (LANTUS) injection 20 Units   . metoprolol succinate (TOPROL-XL) 24 hr tablet 150 mg   . sodium chloride flush (NS) 0.9 % injection 3 mL   . sodium chloride flush (NS) 0.9 % injection 3 mL   . 0.9 %  sodium chloride infusion   . magnesium citrate solution 1 Bottle   . cloNIDine (CATAPRES) tablet 0.3 mg     Social History:  reports that she quit smoking about 15 years ago. Her smoking use included Cigarettes. She smoked 0.00 packs per day for 25.00 years. She has never used  smokeless tobacco. She reports that she does not drink alcohol or use drugs.  Family History:   Family History  Problem Relation Age of Onset  . Diabetes Mother   . Hypertension Mother   . Stroke Mother   . Hypertension Father   . Hypertension Brother   . Hypertension Sister   . Diabetes Sister   . Hyperlipidemia Sister     Pertinent items are noted in HPI. Weight change: -0.816 kg (-1 lb 12.8 oz)  Intake/Output Summary (Last 24 hours) at 12/20/15 1147 Last data filed at 12/20/15 1024  Gross per 24 hour  Intake              480 ml  Output             1600 ml  Net            -1120 ml   BP (!) 195/79 (BP Location: Left Arm)   Pulse 73   Temp 98.1 F (36.7 C) (Oral)   Resp 18   Ht 5' 10.5" (1.791 m)   Wt 112.5 kg (248 lb 1.6 oz) Comment: scale c  LMP 10/13/2014 (Exact Date)   SpO2 100%   BMI 35.10 kg/m  Vitals:   12/19/15 0421 12/19/15 2019 12/20/15 0449 12/20/15 1101  BP: (!) 172/79 (!) 163/74 (!) 176/80 (!) 195/79  Pulse: 74 73 72 73  Resp: 18 18 18 18   Temp: 98.3 F (36.8 C) 98.8 F (37.1 C) 98.6 F (37 C) 98.1 F (36.7 C)  TempSrc: Oral Oral Oral Oral  SpO2: 97% 100% 100% 100%  Weight: 113.4 kg (249 lb 14.4 oz)  112.5 kg (248 lb 1.6 oz)   Height:         General appearance: alert, cooperative and no distress Head: Normocephalic, without obvious abnormality, atraumatic Resp: rhonchi bilaterally Cardio: regular rate and rhythm and no rub GI: soft, non-tender; bowel sounds normal; no masses,  no organomegaly Extremities: edema 1+ pretibial edema R>L  Labs: Basic Metabolic Panel:  Recent Labs Lab 12/17/15 2249 12/19/15 0320 12/19/15 1209 12/20/15 0258  NA 139 138 139 139  K 4.0 6.1* 4.5  4.4 4.3  CL 111 107 104 101  CO2 17* 24 27 26   GLUCOSE 261* 181* 244* 195*  BUN 32* 41* 34* 37*  CREATININE 2.90* 3.03* 3.14* 3.25*  CALCIUM 9.7 9.7 10.0 9.1   Liver Function Tests: No results for input(s): AST, ALT, ALKPHOS, BILITOT, PROT,  ALBUMIN in the  last 168 hours. No results for input(s): LIPASE, AMYLASE in the last 168 hours. No results for input(s): AMMONIA in the last 168 hours. CBC:  Recent Labs Lab 12/17/15 2249 12/18/15 0336  WBC 13.3* 9.6  NEUTROABS  --  8.5*  HGB 11.4* 9.6*  HCT 35.6* 30.9*  MCV 95.4 94.5  PLT 235 199   PT/INR: @LABRCNTIP (inr:5) Cardiac Enzymes: )No results for input(s): CKTOTAL, CKMB, CKMBINDEX, TROPONINI in the last 168 hours. CBG:  Recent Labs Lab 12/19/15 1209 12/19/15 1706 12/19/15 2129 12/20/15 0552 12/20/15 1131  GLUCAP 233* 249* 216* 208* 181*    Iron Studies: No results for input(s): IRON, TIBC, TRANSFERRIN, FERRITIN in the last 168 hours.  Xrays/Other Studies: US Renal  Result Date: 12/18/2015 CLINICAL DATA:  Acute renal failure.  Diabetes and hypertension. EXAM: RENAL / URINARY TRACT ULTRASOUND COMPLETE COMPARISON:  05/20/2012 FINDINGS: Right Kidney: Length: 11.4 cm. Echogenicity within normal limits. No mass or hydronephrosis visualized. Left Kidney: Length: 11.0 cm. Echogenicity within normal limits. No mass or hydronephrosis visualized. Bladder: Appears normal for degree of bladder distention. IMPRESSION: Normal size kidneys.  No evidence of hydronephrosis. Electronically Signed   By: Earle Gell M.D.   On: 12/18/2015 14:20     Assessment/Plan: 1.  AKI/CKD in setting of decompensated CHF with escalating diuresis. 2. Malignant HTN- will increase clonidine to 0.3mg  tid and follow BP 3. CKD stage 3-4 due to DM and HTN.  Scr is above baseline of 1.7-2 but more likely has had progressive disease with new baseline of 2.4-2.7.  Continue to hold ACE/ARB in light of AKI/CKD.   Focus on BP control as well as diabetes management. 1. Will need to renal dose gabapentin to 300mg  qhs  4. CHF- improving with IV lasix. Would like to change to po in am 5. DM- per primary svc 6. CAD- stable 7. Disposition- hopeful discharge back to home when stable.   Governor Rooks Cassadi Purdie 12/20/2015, 11:47 AM

## 2015-12-21 ENCOUNTER — Ambulatory Visit: Payer: Medicare Other | Admitting: Neurology

## 2015-12-21 LAB — RENAL FUNCTION PANEL
ANION GAP: 5 (ref 5–15)
Albumin: 3.2 g/dL — ABNORMAL LOW (ref 3.5–5.0)
BUN: 37 mg/dL — ABNORMAL HIGH (ref 6–20)
CALCIUM: 9.3 mg/dL (ref 8.9–10.3)
CHLORIDE: 106 mmol/L (ref 101–111)
CO2: 30 mmol/L (ref 22–32)
Creatinine, Ser: 3.14 mg/dL — ABNORMAL HIGH (ref 0.44–1.00)
GFR calc Af Amer: 17 mL/min — ABNORMAL LOW (ref >60)
GFR calc non Af Amer: 15 mL/min — ABNORMAL LOW (ref >60)
GLUCOSE: 194 mg/dL — AB (ref 65–99)
Phosphorus: 4.1 mg/dL (ref 2.5–4.6)
Potassium: 4.6 mmol/L (ref 3.5–5.1)
SODIUM: 141 mmol/L (ref 135–145)

## 2015-12-21 LAB — GLUCOSE, CAPILLARY
GLUCOSE-CAPILLARY: 211 mg/dL — AB (ref 65–99)
GLUCOSE-CAPILLARY: 188 mg/dL — AB (ref 65–99)
GLUCOSE-CAPILLARY: 182 mg/dL — AB (ref 65–99)
Glucose-Capillary: 197 mg/dL — ABNORMAL HIGH (ref 65–99)
Glucose-Capillary: 206 mg/dL — ABNORMAL HIGH (ref 65–99)

## 2015-12-21 LAB — CBC
HCT: 29.5 % — ABNORMAL LOW (ref 36.0–46.0)
HEMOGLOBIN: 9.2 g/dL — AB (ref 12.0–15.0)
MCH: 29.6 pg (ref 26.0–34.0)
MCHC: 31.2 g/dL (ref 30.0–36.0)
MCV: 94.9 fL (ref 78.0–100.0)
Platelets: 197 10*3/uL (ref 150–400)
RBC: 3.11 MIL/uL — ABNORMAL LOW (ref 3.87–5.11)
RDW: 15 % (ref 11.5–15.5)
WBC: 5.6 10*3/uL (ref 4.0–10.5)

## 2015-12-21 MED ORDER — CLONIDINE HCL 0.3 MG PO TABS
0.3000 mg | ORAL_TABLET | Freq: Four times a day (QID) | ORAL | Status: DC
  Administered 2015-12-21 – 2015-12-22 (×5): 0.3 mg via ORAL
  Filled 2015-12-21 (×5): qty 1

## 2015-12-21 MED ORDER — SODIUM CHLORIDE 0.9 % IV SOLN
510.0000 mg | Freq: Once | INTRAVENOUS | Status: AC
  Administered 2015-12-21: 510 mg via INTRAVENOUS
  Filled 2015-12-21 (×2): qty 17

## 2015-12-21 NOTE — Progress Notes (Signed)
Patient ID: Brooke Stafford, female   DOB: 1955/10/25, 60 y.o.   MRN: [id number] S:Feeling better but admits to getting dizzy upon standing O:BP (!) 163/66 (BP Location: Right Arm)   Pulse 64   Temp 98.7 F (37.1 C) (Oral)   Resp 20   Ht 5' 10.5" (1.791 m)   Wt 112.3 kg (247 lb 9.6 oz)   LMP 10/13/2014 (Exact Date)   SpO2 95%   BMI 35.02 kg/m   Intake/Output Summary (Last 24 hours) at 12/21/15 0832 Last data filed at 12/21/15 0640  Gross per 24 hour  Intake              960 ml  Output             2400 ml  Net            -1440 ml   Intake/Output: I/O last 3 completed shifts: In: 960 [P.O.:960] Out: 3400 [Urine:3400]  Intake/Output this shift:  No intake/output data recorded. Weight change: -0.227 kg (-8 oz) Gen:WD AAF in NAD CVS:no rub Resp:cta JEH:UDJSHF WYO:VZCHY to 1+ pretib edema   Recent Labs Lab 12/17/15 2249 12/19/15 0320 12/19/15 1209 12/20/15 0258 12/21/15 0239  NA 139 138 139 139 141  K 4.0 6.1* 4.5  4.4 4.3 4.6  CL 111 107 104 101 106  CO2 17* 24 27 26 30   GLUCOSE 261* 181* 244* 195* 194*  BUN 32* 41* 34* 37* 37*  CREATININE 2.90* 3.03* 3.14* 3.25* 3.14*  ALBUMIN  --   --   --   --  3.2*  CALCIUM 9.7 9.7 10.0 9.1 9.3  PHOS  --   --   --   --  4.1   Liver Function Tests:  Recent Labs Lab 12/21/15 0239  ALBUMIN 3.2*   No results for input(s): LIPASE, AMYLASE in the last 168 hours. No results for input(s): AMMONIA in the last 168 hours. CBC:  Recent Labs Lab 12/17/15 2249 12/18/15 0336 12/21/15 0239  WBC 13.3* 9.6 5.6  NEUTROABS  --  8.5*  --   HGB 11.4* 9.6* 9.2*  HCT 35.6* 30.9* 29.5*  MCV 95.4 94.5 94.9  PLT 235 199 197   Cardiac Enzymes: No results for input(s): CKTOTAL, CKMB, CKMBINDEX, TROPONINI in the last 168 hours. CBG:  Recent Labs Lab 12/19/15 2129 12/20/15 0552 12/20/15 1131 12/20/15 2124 12/21/15 0558  GLUCAP 216* 208* 181* 191* 197*    Iron Studies:  Recent Labs  12/20/15 1559  IRON 61  TIBC 323   FERRITIN 68   Studies/Results: No results found. [name] amLODipine  10 mg Oral Daily  . aspirin  81 mg Oral Daily  . atorvastatin  40 mg Oral Daily  . cloNIDine  0.3 mg Oral TID  . clopidogrel  75 mg Oral Daily  . enoxaparin (LOVENOX) injection  30 mg Subcutaneous Daily  . ezetimibe  10 mg Oral Daily  . famotidine  20 mg Oral BID  . furosemide  80 mg Oral BID  . guaiFENesin  600 mg Oral BID  . insulin aspart  0-9 Units Subcutaneous TID WC  . insulin glargine  15 Units Subcutaneous BID  . isosorbide mononitrate  120 mg Oral Daily  . levothyroxine  25 mcg Oral QAC breakfast  . metoprolol succinate  200 mg Oral QPC breakfast  . senna  1 tablet Oral QHS  . sorbitol, milk of mag, mineral oil, glycerin (SMOG) enema  960 mL Rectal Once  . traZODone  50 mg Oral  QHS    BMET    Component Value Date/Time   NA 141 12/21/2015 0239   NA 141 08/08/2014 1445   K 4.6 12/21/2015 0239   CL 106 12/21/2015 0239   CO2 30 12/21/2015 0239   GLUCOSE 194 (H) 12/21/2015 0239   BUN 37 (H) 12/21/2015 0239   BUN 9 08/08/2014 1445   CREATININE 3.14 (H) 12/21/2015 0239   CREATININE 2.36 (H) 08/24/2015 1641   CALCIUM 9.3 12/21/2015 0239   GFRNONAA 15 (L) 12/21/2015 0239   GFRNONAA 22 (L) 08/24/2015 1641   GFRAA 17 (L) 12/21/2015 0239   GFRAA 25 (L) 08/24/2015 1641   CBC    Component Value Date/Time   WBC 5.6 12/21/2015 0239   RBC 3.11 (L) 12/21/2015 0239   HGB 9.2 (L) 12/21/2015 0239   HCT 29.5 (L) 12/21/2015 0239   PLT 197 12/21/2015 0239   MCV 94.9 12/21/2015 0239   MCH 29.6 12/21/2015 0239   MCHC 31.2 12/21/2015 0239   RDW 15.0 12/21/2015 0239   LYMPHSABS 0.5 (L) 12/18/2015 0336   MONOABS 0.5 12/18/2015 0336   EOSABS 0.1 12/18/2015 0336   BASOSABS 0.0 12/18/2015 0336     Assessment/Plan: 1.  AKI/CKD in setting of decompensated CHF with escalating diuresis. 1. Improving today 2. Malignant HTN- will increase clonidine to 0.3mg  tid and follow BP 1. Agree with increasing clonidine to qid  and follow BP's but need to include orthostatic readings as she likely has some underlying autonomic dysfunction and will likely be exacerbated by clonidine and beta blockers.   2. Was considering bidil 1 bid or hydralazine but she reports severe reaction to hydralazine 3. Ace/ARB on hold due to AKI/CKD) 3. CKD stage 3-4 due to DM and HTN.  Scr is above baseline of 1.7-2 but more likely has had progressive disease with new baseline of 2.4-2.7.  Continue to hold ACE/ARB in light of AKI/CKD.   Focus on BP control as well as diabetes management. 1. Will need to renal dose gabapentin to 300mg  qhs  4. CHF- improving with IV lasix. Would like to change to po in am 5. Anemia of chronic disease- along with iron deficiency 1. Replete with IV Iron but may require initiation of ESA 6. DM- per primary svc 7. CAD- stable 8. Disposition- hopeful discharge back to home when stable.  [place]

## 2015-12-21 NOTE — Progress Notes (Signed)
Inpatient Diabetes Program Recommendations  AACE/ADA: New Consensus Statement on Inpatient Glycemic Control (2015)  Target Ranges:  Prepandial:   less than 140 mg/dL      Peak postprandial:   less than 180 mg/dL (1-2 hours)      Critically ill patients:  140 - 180 mg/dL   Lab Results  Component Value Date   GLUCAP 188 (H) 12/21/2015   HGBA1C 6.2 11/02/2015    Review of Glycemic Control  Results for LAURIS, KEEPERS (MRN [id number]) as of 12/21/2015 12:55  Ref. Range 12/20/2015 05:52 12/20/2015 11:31 12/20/2015 16:32 12/20/2015 21:24 12/21/2015 05:58 12/21/2015 11:39  Glucose-Capillary Latest Ref Range: 65 - 99 mg/dL 208 (H) 181 (H) 211 (H) 191 (H) 197 (H) 188 (H)    Inpatient Diabetes Program Recommendations:     FBS > 180 mg/dL Post-prandial blood sugars elevated. Needs insulin adjustment.  Recommendations: Lantus 18 units bid Novolog 4 units tidwc for meal coverage insulin.  Will continue to follow. Thank you. Lorenda Peck, RD, LDN, CDE Inpatient Diabetes Coordinator [phone]

## 2015-12-21 NOTE — Progress Notes (Signed)
PROGRESS NOTE                                                                                                                                                                                                             Patient Demographics:    Brooke Stafford, is a 60 y.o. female, DOB - 06-Oct-1955, XBJ:[id number]  Admit date - 12/17/2015   Admitting Physician Norval Morton, MD  Outpatient Primary MD for the patient is Angelica Chessman, MD  LOS - 3  Chief Complaint  Patient presents with  . Respiratory Distress       Brief Narrative    Brooke Stafford is a 60 y.o. female with medical history significant of diastolic CHF, CAD s/p CABG, asthma/COPD, CVA in7/2017 with residual right-sided weakness, and anxiety; who presents with acute onset of shortness of breath. She expresses that for the last week she noticed that she was wheezing when she laid down at night. For the last 2 days, she complained of significant constipation for which she tried to use milk of magnesia without relief of symptoms. While straining trying to have a bowel movement yesterday evening she developed acute onset of shortness of breath.  Associated symptoms include a dry cough, abdominal distention, chills, and falls. She immediately called EMS who advised her to try her albuterol nebulizer machine, and subsequently nitroglycerin without relief of symptoms. She notes over the last week that she's gained approximately 6 - 8 pounds, and 26 pounds over the last year. She just received her flu vaccine last month. Denies any headache, hemoptysis, blood in stool/urine, or dysuria.   ED Course: Upon EMS arrival, the patient was placed on CPAP, but continued to have significant respiratory distress. On arrival to the emergency department they were in the process of preparing the patient for intubation for protection of airway. However, the patient was switched to  BiPAP with improvement in respiratory status and did not require intubation. Now on nasal cannula oxygen although patient does not use oxygen at home. Initial lab work revealed elevated BNP of 1144 and imaging studies suggest pulmonary edema. Patient was given 80 mg of Lasix IV.  [facility] called to admit.    Subjective:    Brooke Stafford today has, No headache, No chest pain, No abdominal pain - No Nausea, No new weakness tingling or numbness, No  Cough - Improved but +ve SOB.   Assessment  & Plan :     1.Acute on chronic diastolic CHF EF 95-18% on echocardiogram in April 2017. Continue IV Lasix, is showing improvement, continue Imdur, continue beta blocker, oxygen nebulizer treatments as needed, salt and fluid restriction, monitor intake and output and weight. Cardiology consulted. SOB much better, - 4 Lit  Filed Weights   12/19/15 0421 12/20/15 0449 12/21/15 0500  Weight: 113.4 kg (249 lb 14.4 oz) 112.5 kg (248 lb 1.6 oz) 112.3 kg (247 lb 9.6 oz)    2. Acute hypoxic respiratory failure due to #1 above. Treatment as above  3. ARF on CK D stage III with Hyperkalemia. Baseline creatinine close to 1.5-2, worse due to CHF decompensation, Stable renal ultrasound, nephrology following.  4. Essential hypertension. On combination of Norvasc, clonidine, beta blocker, Imdur, diuretics will monitor and adjust. Have increase clonidine from twice a day to 4 times a day for better control.  5. CAD status post CABG. Chest pain-free, no acute issues, continue Aspirin, Imdur, Plavix, statin along with beta blocker for secondary prevention.   6.Hypothyroidism. Continue home dose Synthroid TSH was stable.  7. Dyslipidemia. On statin.  8. Anxiety. Stable on on up and as needed.  9. DM type II. On Lantus and sliding scale monitor closely.  Lab Results  Component Value Date   HGBA1C 6.2 11/02/2015   CBG (last 3)   Recent Labs  12/20/15 1632 12/20/15 2124 12/21/15 0558  GLUCAP 211* 191* 197*      Family Communication  :  None  Code Status :  Full  Diet : Heart Healthy Low Carb, 1.5 L total fluid restriction  Disposition Plan  :  Stay inpt  Consults  :  Cards, Renal  Procedures  :    TTE April 2017 -  Left ventricle: The cavity size was normal. Wall thickness was normal. Systolic function was normal. The estimated ejection fraction was in the range of 50% to 55%. Wall motion was normal;  there were no regional wall motion abnormalities. Features are consistent with a pseudonormal left ventricular filling pattern, with concomitant abnormal relaxation and increased filling  pressure (grade 2 diastolic dysfunction).  Left atrium: The atrium was mildly dilated.  Renal ultrasound. Nonacute.    DVT Prophylaxis  :  Lovenox    Lab Results  Component Value Date   PLT 197 12/21/2015    Inpatient Medications  Scheduled Meds: . amLODipine  10 mg Oral Daily  . aspirin  81 mg Oral Daily  . atorvastatin  40 mg Oral Daily  . cloNIDine  0.3 mg Oral TID  . clopidogrel  75 mg Oral Daily  . enoxaparin (LOVENOX) injection  30 mg Subcutaneous Daily  . ezetimibe  10 mg Oral Daily  . famotidine  20 mg Oral BID  . ferumoxytol  510 mg Intravenous Once  . furosemide  80 mg Oral BID  . guaiFENesin  600 mg Oral BID  . insulin aspart  0-9 Units Subcutaneous TID WC  . insulin glargine  15 Units Subcutaneous BID  . isosorbide mononitrate  120 mg Oral Daily  . levothyroxine  25 mcg Oral QAC breakfast  . metoprolol succinate  200 mg Oral QPC breakfast  . senna  1 tablet Oral QHS  . sorbitol, milk of mag, mineral oil, glycerin (SMOG) enema  960 mL Rectal Once  . traZODone  50 mg Oral QHS   Continuous Infusions: . albuterol Stopped (12/18/15 0115)   PRN  Meds:.acetaminophen, acetaminophen-codeine, clonazePAM, fluticasone, guaiFENesin-dextromethorphan, ipratropium-albuterol, nitroGLYCERIN, ondansetron (ZOFRAN) IV  Antibiotics  :    Anti-infectives    None         Objective:    Vitals:   12/20/15 2117 12/21/15 0500 12/21/15 0638 12/21/15 0922  BP: (!) 158/67  (!) 163/66 (!) 188/66  Pulse: 61  64 68  Resp:   20   Temp:   98.7 F (37.1 C)   TempSrc:   Oral   SpO2:   95% 98%  Weight:  112.3 kg (247 lb 9.6 oz)    Height:        Wt Readings from Last 3 Encounters:  12/21/15 112.3 kg (247 lb 9.6 oz)  12/14/15 117 kg (258 lb)  11/30/15 114.5 kg (252 lb 6.4 oz)     Intake/Output Summary (Last 24 hours) at 12/21/15 1001 Last data filed at 12/21/15 0914  Gross per 24 hour  Intake              840 ml  Output             2400 ml  Net            -1560 ml     Physical Exam  Awake Alert, Oriented X 3, No new F.N deficits, Normal affect [place].AT,PERRAL Supple Neck,No JVD, No cervical lymphadenopathy appriciated.  Symmetrical Chest wall movement, Good air movement bilaterally, +ve rales RRR,No Gallops,Rubs or new Murmurs, No Parasternal Heave +ve B.Sounds, Abd Soft, No tenderness, No organomegaly appriciated, No rebound - guarding or rigidity. No Cyanosis, Clubbing , 1+ edema, No new Rash or bruise       Data Review:    CBC  Recent Labs Lab 12/17/15 2249 12/18/15 0336 12/21/15 0239  WBC 13.3* 9.6 5.6  HGB 11.4* 9.6* 9.2*  HCT 35.6* 30.9* 29.5*  PLT 235 199 197  MCV 95.4 94.5 94.9  MCH 30.6 29.4 29.6  MCHC 32.0 31.1 31.2  RDW 15.4 15.5 15.0  LYMPHSABS  --  0.5*  --   MONOABS  --  0.5  --   EOSABS  --  0.1  --   BASOSABS  --  0.0  --     Chemistries   Recent Labs Lab 12/17/15 2249 12/19/15 0320 12/19/15 1209 12/20/15 0258 12/21/15 0239  NA 139 138 139 139 141  K 4.0 6.1* 4.5  4.4 4.3 4.6  CL 111 107 104 101 106  CO2 17* 24 27 26 30   GLUCOSE 261* 181* 244* 195* 194*  BUN 32* 41* 34* 37* 37*  CREATININE 2.90* 3.03* 3.14* 3.25* 3.14*  CALCIUM 9.7 9.7 10.0 9.1 9.3   ------------------------------------------------------------------------------------------------------------------ No results for input(s): CHOL, HDL, LDLCALC, TRIG,  CHOLHDL, LDLDIRECT in the last 72 hours.  Lab Results  Component Value Date   HGBA1C 6.2 11/02/2015   ------------------------------------------------------------------------------------------------------------------ No results for input(s): TSH, T4TOTAL, T3FREE, THYROIDAB in the last 72 hours.  Invalid input(s): FREET3 ------------------------------------------------------------------------------------------------------------------  Recent Labs  12/20/15 1559  FERRITIN 68  TIBC 323  IRON 61    Coagulation profile  Recent Labs Lab 12/17/15 2249  INR 0.91    No results for input(s): DDIMER in the last 72 hours.  Cardiac Enzymes No results for input(s): CKMB, TROPONINI, MYOGLOBIN in the last 168 hours.  Invalid input(s): CK ------------------------------------------------------------------------------------------------------------------    Component Value Date/Time   BNP 1,144.8 (H) 12/17/2015 2249    Micro Results No results found for this or any previous visit (from the past 240 hour(s)).  Radiology  Reports US Renal  Result Date: 12/18/2015 CLINICAL DATA:  Acute renal failure.  Diabetes and hypertension. EXAM: RENAL / URINARY TRACT ULTRASOUND COMPLETE COMPARISON:  05/20/2012 FINDINGS: Right Kidney: Length: 11.4 cm. Echogenicity within normal limits. No mass or hydronephrosis visualized. Left Kidney: Length: 11.0 cm. Echogenicity within normal limits. No mass or hydronephrosis visualized. Bladder: Appears normal for degree of bladder distention. IMPRESSION: Normal size kidneys.  No evidence of hydronephrosis. Electronically Signed   By: Earle Gell M.D.   On: 12/18/2015 14:20   Dg Chest Portable 1 View  Result Date: 12/17/2015 CLINICAL DATA:  Acute onset of shortness of breath. Initial encounter. EXAM: PORTABLE CHEST 1 VIEW COMPARISON:  Chest radiograph performed 07/25/2015 FINDINGS: The lungs are well-aerated. Mild bibasilar opacities may reflect pneumonia or pulmonary  edema. Mild vascular congestion is noted. No pleural effusion or pneumothorax is seen. The cardiomediastinal silhouette is mildly enlarged. The patient is status post median sternotomy, with evidence of prior CABG. No acute osseous abnormalities are seen. IMPRESSION: Mild bibasilar opacities may reflect pneumonia or pulmonary edema. Mild vascular congestion and mild cardiomegaly noted. Electronically Signed   By: Garald Balding M.D.   On: 12/17/2015 23:20    Time Spent in minutes  30   SINGH,PRASHANT K M.D on 12/21/2015 at 10:01 AM  Between 7am to 7pm - Pager - [phone]  After 7pm go to [email or web address] - password [facility]  [facility] -  Office  [phone]

## 2015-12-21 NOTE — Care Management Important Message (Signed)
Important Message  Patient Details  Name: Brooke Stafford MRN: [id number] Date of Birth: Jan 25, 1956   Medicare Important Message Given:  Yes    Orbie Pyo 12/21/2015, 10:04 AM

## 2015-12-22 ENCOUNTER — Other Ambulatory Visit: Payer: Self-pay

## 2015-12-22 DIAGNOSIS — N189 Chronic kidney disease, unspecified: Secondary | ICD-10-CM

## 2015-12-22 DIAGNOSIS — N179 Acute kidney failure, unspecified: Secondary | ICD-10-CM

## 2015-12-22 LAB — RENAL FUNCTION PANEL
ANION GAP: 9 (ref 5–15)
Albumin: 3.3 g/dL — ABNORMAL LOW (ref 3.5–5.0)
BUN: 40 mg/dL — ABNORMAL HIGH (ref 6–20)
CHLORIDE: 104 mmol/L (ref 101–111)
CO2: 27 mmol/L (ref 22–32)
Calcium: 9.5 mg/dL (ref 8.9–10.3)
Creatinine, Ser: 3.38 mg/dL — ABNORMAL HIGH (ref 0.44–1.00)
GFR calc non Af Amer: 14 mL/min — ABNORMAL LOW (ref >60)
GFR, EST AFRICAN AMERICAN: 16 mL/min — AB (ref >60)
Glucose, Bld: 219 mg/dL — ABNORMAL HIGH (ref 65–99)
Phosphorus: 3.8 mg/dL (ref 2.5–4.6)
Potassium: 4.4 mmol/L (ref 3.5–5.1)
Sodium: 140 mmol/L (ref 135–145)

## 2015-12-22 LAB — GLUCOSE, CAPILLARY
GLUCOSE-CAPILLARY: 188 mg/dL — AB (ref 65–99)
Glucose-Capillary: 182 mg/dL — ABNORMAL HIGH (ref 65–99)

## 2015-12-22 MED ORDER — FUROSEMIDE 40 MG PO TABS: 80.0000 mg | ORAL_TABLET | Freq: Every day | ORAL | 0 refills | Status: DC

## 2015-12-22 MED ORDER — CLONIDINE HCL 0.3 MG PO TABS: 0.3000 mg | ORAL_TABLET | Freq: Four times a day (QID) | ORAL | 0 refills | Status: DC

## 2015-12-22 NOTE — Progress Notes (Signed)
Pt's progress reviewed Note recomm by renal service (J Colodonato).   Will be available as needed

## 2015-12-22 NOTE — Progress Notes (Signed)
SATURATION QUALIFICATIONS: (This note is used to comply with regulatory documentation for home oxygen) ° °Patient Saturations on Room Air at Rest = 96% ° °Patient Saturations on Room Air while Ambulating = 89% ° ° °

## 2015-12-22 NOTE — Consult Note (Signed)
   [facility] Inpatient Consult   12/22/2015  Brooke Stafford Dec 27, 1955 [id number]  Patient evaluated for community based chronic disease management services with [facility] as a benefit of patient's Medicare Insurance.  Chart review reveals the patient is a 60 y.o.femalewith medical history significant of diastolic HF,CAD s/p CABG,asthma/COPD, CVA in7/2017 with residual right-sided weakness, andanxiety; who presents with acute onset of shortness of breath. She expresses that for the last week she noticed that she was wheezing when she laid down at night. For the last 2 days, she complained of significant constipation for which she tried to use milk of magnesia without relief of symptoms. While straining trying to have a bowel movement yesterday evening she developed acute onset of shortness of breath. Associated symptoms include a dry cough, abdominal distention, chills, and falls. She immediately called EMS who advised her to try her albuterol nebulizer machine,and subsequently nitroglycerin without relief of symptoms. She notes over the last week that she's gained approximately 6 - 8 pounds,and 26 pounds over the last year. She just received her flu vaccine last month. Denies any headache, hemoptysis, blood in stool/urine, or dysuria per MD note.[name]   Spoke with patient and friend at the bedsideat bedside to explain [facility] services.   Patient states she is having difficulty since the death of her son in 2022-09-02.  She has asked for grief counseling but was told they weren't taking new clients.  She did not know the name of the agency.  She is having some housing difficulties with mold under her house.  She is most concerned for her shortness of breath and kidney problems. Patient signed the consent and a folder given with [place] [facility] information.    Patient will receive post hospital discharge call and will be evaluated for monthly home visits for assessments and  disease process education.  Left contact information and [facility] literature at bedside.  Of note, [facility] services does not replace or interfere with any services that are arranged by inpatient case management or social work.  For additional questions or referrals please contact:    Natividad Brood, RN BSN [facility] Liaison  [phone] business mobile phone Toll free office [phone]

## 2015-12-22 NOTE — Discharge Summary (Signed)
Brooke Stafford YKZ:[id number] DOB: 02/08/1956 DOA: 12/17/2015  PCP: Angelica Chessman, MD  Admit date: 12/17/2015  Discharge date: 12/22/2015  Admitted From: home   Disposition:  home   Recommendations for Outpatient Follow-up:   Follow up with PCP in 1-2 weeks  PCP Please obtain BMP/CBC, 2 view CXR in 1week,  (see Discharge instructions)   PCP Please follow up on the following pending results: None   Home Health: None   Equipment/Devices: none  Consultations: Renal, cards Discharge Condition:  Stable CODE STATUS: Full   Diet Recommendation: Heart healthy, low carbohydrate.   Chief Complaint  Patient presents with  . Respiratory Distress     Brief history of present illness from the day of admission and additional interim summary    Brooke Stafford a 60 y.o.femalewith medical history significant of diastolic CHF,CAD s/p CABG,asthma/COPD, CVA in7/2017 with residual right-sided weakness, andanxiety; who presents with acute onset of shortness of breath. She expresses that for the last week she noticed that she was wheezing when she laid down at night. For the last 2 days, she complained of significant constipation for which she tried to use milk of magnesia without relief of symptoms. While straining trying to have a bowel movement yesterday evening she developed acute onset of shortness of breath. Associated symptoms include a dry cough, abdominal distention, chills, and falls. She immediately called EMS who advised her to try her albuterol nebulizer machine,and subsequently nitroglycerin without relief of symptoms. She notes over the last week that she's gained approximately 6 - 8 pounds,and 26 pounds over the last year. She just received her flu vaccine last month. Denies any headache, hemoptysis, blood  in stool/urine, or dysuria.   ED Course:Upon EMS arrival,the patient was placed on CPAP,but continued to have significant respiratory distress. On arrival to the emergency department they were in the process of preparing the patient for intubation for protection of airway. However, the patient was switched to BiPAP with improvement in respiratory status and did not require intubation. Now on nasal cannula oxygen although patient does not use oxygen at home. Initial lab work revealed elevated BNP of 1144and imaging studies suggest pulmonary edema. Patient was given 80 mg of Lasix IV. TRH called to admit.     Hospital issues addressed     1.Acute on chronic diastolic CHF EF 93-90% on echocardiogram in April 2017. She was diuresed with IV Lasix and now is stable at baseline, no shortness of breath and no oxygen requirement, continue Imdur, continue beta blocker salt and fluid restriction, monitor intake and output and weight. Seen by both cardiology and renal, Lasix dose has been increased to 80 mg daily, follow with PCP and cardiology along with nephrology outpatient as needed. - 4.5 Lit.  Filed Weights   12/20/15 0449 12/21/15 0500 12/22/15 0509  Weight: 112.5 kg (248 lb 1.6 oz) 112.3 kg (247 lb 9.6 oz) 111.3 kg (245 lb 6.4 oz)    2. Acute hypoxic respiratory failure due to #1 above. Treatment as above  3. ARF  on CK D stage III with Hyperkalemia. Baseline creatinine close to 1.5-2, worse due to CHF decompensation, Stable renal ultrasound, Seen by nephrology, hyperkalemia was treated with hyperkalemia protocol with good results, renal function has plateaued, cleared by nephrology for home discharge today on Lasix 80 mg daily with outpatient renal follow-up.  4. Essential hypertension. On combination of Norvasc, clonidine, beta blocker, Imdur, diuretics will monitor and adjust. Have increase clonidine from twice a day to 4 times a day for better control.  5. CAD status post CABG. Chest  pain-free, no acute issues, continue Aspirin, Imdur, Plavix, statin along with beta blocker for secondary prevention.   6.Hypothyroidism. Continue home dose Synthroid TSH was stable.  7. Dyslipidemia. On statin.  8. Anxiety. Stable on on up and as needed.  9. DM type II. On Lantus and sliding scale monitor closely.  Lab Results  Component Value Date   HGBA1C 6.2 11/02/2015   CBG (last 3)   Recent Labs  12/21/15 1639 12/21/15 2134 12/22/15 0601  GLUCAP 182* 206* 182*      Discharge diagnosis     Principal Problem:   Acute CHF ([place]) Active Problems:   Hypothyroidism   Gout of big toe   Essential hypertension   Acute kidney injury superimposed on chronic kidney disease (HCC)   Constipation   Acute on chronic systolic CHF (congestive heart failure) ([place])    Discharge instructions    Discharge Instructions    AMB Referral to [facility]    Complete by:  As directed   Reason for consult:  Post hospital monitoring, multiple cormorbidities, multiple medications   Diagnoses of:   Kidney Failure Diabetes     Expected date of contact:  1-3 days (reserved for hospital discharges)   Please assign to community nurse for transition of care calls and assess for home visits.  Please assess for social worker for grief counseling needs.  Multiple medications.   Questions please call:   Natividad Brood, RN BSN [facility] Liaison  [phone] business mobile phone Toll free office [phone]   Discharge instructions    Complete by:  As directed   Follow with Primary MD Angelica Chessman, MD in 7 days   Get CBC, CMP, 2 view Chest X ray checked  by Primary MD or SNF MD in 5-7 days ( we routinely change or add medications that can affect your baseline labs and fluid status, therefore we recommend that you get the mentioned basic workup next visit with your PCP, your PCP may decide not to get them or add new tests based on their clinical  decision)   Activity: As tolerated with Full fall precautions use walker/cane & assistance as needed   Disposition Home     Diet:   Heart Healthy Low carb.  For Heart failure patients - Check your Weight same time everyday, if you gain over 2 pounds, or you develop in leg swelling, experience more shortness of breath or chest pain, call your Primary MD immediately. Follow Cardiac Low Salt Diet and 1.5 lit/day fluid restriction.   On your next visit with your primary care physician please Get Medicines reviewed and adjusted.   Please request your Prim.MD to go over all Hospital Tests and Procedure/Radiological results at the follow up, please get all Hospital records sent to your Prim MD by signing hospital release before you go home.   If you experience worsening of your admission symptoms, develop shortness of breath, life threatening emergency, suicidal or homicidal  thoughts you must seek medical attention immediately by calling 911 or calling your MD immediately  if symptoms less severe.  You Must read complete instructions/literature along with all the possible adverse reactions/side effects for all the Medicines you take and that have been prescribed to you. Take any new Medicines after you have completely understood and accpet all the possible adverse reactions/side effects.   Do not drive, operate heavy machinery, perform activities at heights, swimming or participation in water activities or provide baby sitting services if your were admitted for syncope or siezures until you have seen by Primary MD or a Neurologist and advised to do so again.  Do not drive when taking Pain medications.    Do not take more than prescribed Pain, Sleep and Anxiety Medications  Special Instructions: If you have smoked or chewed Tobacco  in the last 2 yrs please stop smoking, stop any regular Alcohol  and or any Recreational drug use.  Wear Seat belts while driving.   Please note  You were  cared for by a hospitalist during your hospital stay. If you have any questions about your discharge medications or the care you received while you were in the hospital after you are discharged, you can call the unit and asked to speak with the hospitalist on call if the hospitalist that took care of you is not available. Once you are discharged, your primary care physician will handle any further medical issues. Please note that NO REFILLS for any discharge medications will be authorized once you are discharged, as it is imperative that you return to your primary care physician (or establish a relationship with a primary care physician if you do not have one) for your aftercare needs so that they can reassess your need for medications and monitor your lab values.   Increase activity slowly    Complete by:  As directed      Discharge Medications     Medication List    TAKE these medications   acetaminophen-codeine 300-30 MG tablet Commonly known as:  TYLENOL #3 Take 1 tablet by mouth every 4 (four) hours as needed for moderate pain.   albuterol 108 (90 Base) MCG/ACT inhaler Commonly known as:  PROVENTIL HFA;VENTOLIN HFA Inhale 2 puffs into the lungs every 6 (six) hours as needed for wheezing or shortness of breath.   allopurinol 300 MG tablet Commonly known as:  ZYLOPRIM Take 1 tablet (300 mg total) by mouth daily.   amLODipine 10 MG tablet Commonly known as:  NORVASC take 1 tablet by mouth once daily What changed:  See the new instructions.   aspirin 81 MG chewable tablet Chew 1 tablet (81 mg total) by mouth daily.   atorvastatin 40 MG tablet Commonly known as:  LIPITOR Take 1 tablet (40 mg total) by mouth daily.   CLEAR EYES OP Place 1 drop into both eyes as needed (for dry eyes).   clonazePAM 0.5 MG tablet Commonly known as:  KLONOPIN Take 1 tablet (0.5 mg total) by mouth 2 (two) times daily as needed for anxiety. What changed:  how much to take   cloNIDine 0.3 MG  tablet Commonly known as:  CATAPRES Take 1 tablet (0.3 mg total) by mouth every 6 (six) hours. What changed:  when to take this   clopidogrel 75 MG tablet Commonly known as:  PLAVIX Take 1 tablet (75 mg total) by mouth daily.   ezetimibe 10 MG tablet Commonly known as:  ZETIA Take 1 tablet (10 mg total)  by mouth daily.   fluticasone 50 MCG/ACT nasal spray Commonly known as:  FLONASE Place 2 sprays into both nostrils daily as needed for allergies or rhinitis.   furosemide 40 MG tablet Commonly known as:  LASIX Take 2 tablets (80 mg total) by mouth daily. What changed:  how much to take   gabapentin 400 MG capsule Commonly known as:  NEURONTIN take 1 capsule by mouth three times a day What changed:  See the new instructions.   insulin aspart 100 UNIT/ML FlexPen Commonly known as:  NOVOLOG FLEXPEN Inject 10-30 units into the skin 3 times daily, plus sliding scale as instructed.   Insulin Glargine 300 UNIT/ML Sopn Commonly known as:  TOUJEO SOLOSTAR Inject 40 Units into the skin at bedtime.   Insulin Pen Needle 31G X 5 MM Misc Use to inject insulin 4 times daily as instructed.   Insulin Syringes (Disposable) U-100 1 ML Misc Commonly known as:  B-D INSULIN SYRINGE 1CC Use to inject insulin 4 times daily as instructed.   isosorbide mononitrate 120 MG 24 hr tablet Commonly known as:  IMDUR Take 1 tablet (120 mg total) by mouth daily.   levothyroxine 25 MCG tablet Commonly known as:  SYNTHROID, LEVOTHROID take 1 tablet by mouth every morning BEFORE BREAKFAST What changed:  See the new instructions.   loratadine 10 MG tablet Commonly known as:  CLARITIN Take 1 tablet (10 mg total) by mouth daily as needed for allergies.   magnesium citrate Soln Take 296 mLs (1 Bottle total) by mouth as needed for severe constipation.   metoprolol succinate 50 MG 24 hr tablet Commonly known as:  TOPROL-XL Take 3 tablets (150 mg total) by mouth daily. Take with or immediately  following a meal.   nitroGLYCERIN 0.4 MG SL tablet Commonly known as:  NITROSTAT Place 1 tablet (0.4 mg total) under the tongue every 5 (five) minutes as needed. For chest   ranitidine 75 MG tablet Commonly known as:  ZANTAC 75 Take 1 tablet (75 mg total) by mouth 2 (two) times daily. What changed:  when to take this  reasons to take this   traZODone 50 MG tablet Commonly known as:  DESYREL Take 1 tablet (50 mg total) by mouth at bedtime. What changed:  when to take this  reasons to take this   triamcinolone ointment 0.5 % Commonly known as:  KENALOG Apply 1 application topically 2 (two) times daily. What changed:  when to take this  reasons to take this       Follow-up Information    JEGEDE, OLUGBEMIGA, MD. Schedule an appointment as soon as possible for a visit in 1 week(s).   Specialty:  Internal Medicine Contact information: [address] [place] [address] [phone]        DUNHAM,CYNTHIA B, MD. Schedule an appointment as soon as possible for a visit in 1 week(s).   Specialty:  Nephrology Contact information: [place] [address] [phone]           Major procedures and Radiology Reports - PLEASE review detailed and final reports thoroughly  -     TTE April 2017 -  Left ventricle: The cavity size was normal. Wall thickness wasnormal. Systolic function was normal. The estimated ejectionfraction was in the range of 50% to 55%. Wall motion was normal; there were no regional wall motion abnormalities. Features areconsistent with a pseudonormal left ventricular filling pattern, with concomitant abnormal relaxation and increased filling pressure (grade 2 diastolic dysfunction).  Left atrium: The atrium was mildly dilated.  Renal ultrasound. Nonacute.    US Renal  Result Date: 12/18/2015 CLINICAL DATA:  Acute renal failure.  Diabetes and hypertension. EXAM: RENAL / URINARY TRACT ULTRASOUND COMPLETE COMPARISON:   05/20/2012 FINDINGS: Right Kidney: Length: 11.4 cm. Echogenicity within normal limits. No mass or hydronephrosis visualized. Left Kidney: Length: 11.0 cm. Echogenicity within normal limits. No mass or hydronephrosis visualized. Bladder: Appears normal for degree of bladder distention. IMPRESSION: Normal size kidneys.  No evidence of hydronephrosis. Electronically Signed   By: Earle Gell M.D.   On: 12/18/2015 14:20   Dg Chest Portable 1 View  Result Date: 12/17/2015 CLINICAL DATA:  Acute onset of shortness of breath. Initial encounter. EXAM: PORTABLE CHEST 1 VIEW COMPARISON:  Chest radiograph performed 07/25/2015 FINDINGS: The lungs are well-aerated. Mild bibasilar opacities may reflect pneumonia or pulmonary edema. Mild vascular congestion is noted. No pleural effusion or pneumothorax is seen. The cardiomediastinal silhouette is mildly enlarged. The patient is status post median sternotomy, with evidence of prior CABG. No acute osseous abnormalities are seen. IMPRESSION: Mild bibasilar opacities may reflect pneumonia or pulmonary edema. Mild vascular congestion and mild cardiomegaly noted. Electronically Signed   By: Garald Balding M.D.   On: 12/17/2015 23:20    Micro Results     No results found for this or any previous visit (from the past 240 hour(s)).  Today   Subjective    Brooke Stafford today has no headache,no chest abdominal pain,no new weakness tingling or numbness, feels much better wants to go home today.    Objective   Blood pressure (!) 167/59, pulse 64, temperature 98.8 F (37.1 C), temperature source Oral, resp. rate 20, height 5' 10.5" (1.791 m), weight 111.3 kg (245 lb 6.4 oz), last menstrual period 10/13/2014, SpO2 95 %.   Intake/Output Summary (Last 24 hours) at 12/22/15 1126 Last data filed at 12/22/15 0918  Gross per 24 hour  Intake             1020 ml  Output             1750 ml  Net             -730 ml    Exam Awake Alert, Oriented x 3, No new F.N deficits,  Normal affect [place].AT,PERRAL Supple Neck,No JVD, No cervical lymphadenopathy appriciated.  Symmetrical Chest wall movement, Good air movement bilaterally, CTAB RRR,No Gallops,Rubs or new Murmurs, No Parasternal Heave +ve B.Sounds, Abd Soft, Non tender, No organomegaly appriciated, No rebound -guarding or rigidity. No Cyanosis, Clubbing or edema, No new Rash or bruise   Data Review   CBC w Diff: Lab Results  Component Value Date   WBC 5.6 12/21/2015   HGB 9.2 (L) 12/21/2015   HCT 29.5 (L) 12/21/2015   PLT 197 12/21/2015   LYMPHOPCT 6 12/18/2015   MONOPCT 5 12/18/2015   EOSPCT 1 12/18/2015   BASOPCT 0 12/18/2015    CMP: Lab Results  Component Value Date   NA 140 12/22/2015   NA 141 08/08/2014   K 4.4 12/22/2015   CL 104 12/22/2015   CO2 27 12/22/2015   BUN 40 (H) 12/22/2015   BUN 9 08/08/2014   CREATININE 3.38 (H) 12/22/2015   CREATININE 2.36 (H) 08/24/2015   PROT 6.7 08/24/2015   ALBUMIN 3.3 (L) 12/22/2015   BILITOT 0.4 08/24/2015   ALKPHOS 98 08/24/2015   AST 17 08/24/2015   ALT 13 08/24/2015  .   Total Time in preparing paper  work, Microbiologist and todays exam - 35 minutes  Thurnell Lose M.D on 12/22/2015 at 11:26 AM  [facility]   Office  [phone]

## 2015-12-22 NOTE — Discharge Instructions (Signed)
Follow with Primary MD JEGEDE, OLUGBEMIGA, MD in 7 days   Get CBC, CMP, 2 view Chest X ray checked  by Primary MD or SNF MD in 5-7 days ( we routinely change or add medications that can affect your baseline labs and fluid status, therefore we recommend that you get the mentioned basic workup next visit with your PCP, your PCP may decide not to get them or add new tests based on their clinical decision)   Activity: As tolerated with Full fall precautions use walker/cane & assistance as needed   Disposition Home     Diet:   Heart Healthy Low carb.  For Heart failure patients - Check your Weight same time everyday, if you gain over 2 pounds, or you develop in leg swelling, experience more shortness of breath or chest pain, call your Primary MD immediately. Follow Cardiac Low Salt Diet and 1.5 lit/day fluid restriction.   On your next visit with your primary care physician please Get Medicines reviewed and adjusted.   Please request your Prim.MD to go over all Hospital Tests and Procedure/Radiological results at the follow up, please get all Hospital records sent to your Prim MD by signing hospital release before you go home.   If you experience worsening of your admission symptoms, develop shortness of breath, life threatening emergency, suicidal or homicidal thoughts you must seek medical attention immediately by calling 911 or calling your MD immediately  if symptoms less severe.  You Must read complete instructions/literature along with all the possible adverse reactions/side effects for all the Medicines you take and that have been prescribed to you. Take any new Medicines after you have completely understood and accpet all the possible adverse reactions/side effects.   Do not drive, operate heavy machinery, perform activities at heights, swimming or participation in water activities or provide baby sitting services if your were admitted for syncope or siezures until you have seen by  Primary MD or a Neurologist and advised to do so again.  Do not drive when taking Pain medications.    Do not take more than prescribed Pain, Sleep and Anxiety Medications  Special Instructions: If you have smoked or chewed Tobacco  in the last 2 yrs please stop smoking, stop any regular Alcohol  and or any Recreational drug use.  Wear Seat belts while driving.   Please note  You were cared for by a hospitalist during your hospital stay. If you have any questions about your discharge medications or the care you received while you were in the hospital after you are discharged, you can call the unit and asked to speak with the hospitalist on call if the hospitalist that took care of you is not available. Once you are discharged, your primary care physician will handle any further medical issues. Please note that NO REFILLS for any discharge medications will be authorized once you are discharged, as it is imperative that you return to your primary care physician (or establish a relationship with a primary care physician if you do not have one) for your aftercare needs so that they can reassess your need for medications and monitor your lab values.

## 2015-12-22 NOTE — Progress Notes (Signed)
Pt has orders to be discharged. Discharge instructions given and pt has no additional questions at this time. Medication regimen reviewed and pt educated. Pt verbalized understanding and has no additional questions. Telemetry box removed. IV removed and site in good condition. Pt stable and waiting for transportation.   Mikenzi Raysor RN 

## 2015-12-22 NOTE — Progress Notes (Signed)
SATURATION QUALIFICATIONS: (This note is used to comply with regulatory documentation for home oxygen)  Patient Saturations on Room Air at Rest = 98%  Patient Saturations on Room Air while Ambulating = 95%   

## 2015-12-22 NOTE — Care Management Note (Signed)
Case Management Note  Patient Details  Name: Brooke Stafford MRN: [id number] Date of Birth: March 13, 1956  Subjective/Objective:       CHF, CAD, CABG, COPD             Action/Plan: Discharge Planning: AVS reviewed:  NCM spoke to pt at bedside. Pt states she lives in an older home and believes it has some mold from when the pipes broke last year. She did let the landlord know. She will look into purchasing an air purifier or filters for the vents or possibly moving out. She afford medications.   PCP- Angelica Chessman E  MD   Expected Discharge Date:  12/21/15               Expected Discharge Plan:  Home/Self Care  In-House Referral:  NA  Discharge planning Services  CM Consult  Post Acute Care Choice:  NA Choice offered to:  NA  DME Arranged:  N/A DME Agency:  NA  HH Arranged:  NA HH Agency:  NA  Status of Service:  Completed, signed off  If discussed at [place] of Stay Meetings, dates discussed:    Additional Comments:  Erenest Rasher, RN 12/22/2015, 12:27 PM

## 2015-12-22 NOTE — Progress Notes (Signed)
Patient ID: Brooke Stafford, female   DOB: [birth year], 60 y.o.   MRN: [id number] S:feels better O:BP (!) 167/59 (BP Location: Left Arm)   Pulse 64   Temp 98.8 F (37.1 C) (Oral)   Resp 20   Ht 5' 10.5" (1.791 m)   Wt 111.3 kg (245 lb 6.4 oz)   LMP 10/13/2014 (Exact Date)   SpO2 97%   BMI 34.71 kg/m   Intake/Output Summary (Last 24 hours) at 12/22/15 0855 Last data filed at 12/22/15 0700  Gross per 24 hour  Intake             1320 ml  Output             1750 ml  Net             -430 ml   Intake/Output: I/O last 3 completed shifts: In: 1560 [P.O.:1560] Out: 3150 [Urine:3150]  Intake/Output this shift:  No intake/output data recorded. Weight change: -0.998 kg (-2 lb 3.2 oz) Gen:WD obese AAF in NAD CVS:no rub Resp:cta IFO:YDXAJO INO:MVEHM pretibial edema   Recent Labs Lab 12/17/15 2249 12/19/15 0320 12/19/15 1209 12/20/15 0258 12/21/15 0239 12/22/15 0341  NA 139 138 139 139 141 140  K 4.0 6.1* 4.5  4.4 4.3 4.6 4.4  CL 111 107 104 101 106 104  CO2 17* 24 27 26 30 27   GLUCOSE 261* 181* 244* 195* 194* 219*  BUN 32* 41* 34* 37* 37* 40*  CREATININE 2.90* 3.03* 3.14* 3.25* 3.14* 3.38*  ALBUMIN  --   --   --   --  3.2* 3.3*  CALCIUM 9.7 9.7 10.0 9.1 9.3 9.5  PHOS  --   --   --   --  4.1 3.8   Liver Function Tests:  Recent Labs Lab 12/21/15 0239 12/22/15 0341  ALBUMIN 3.2* 3.3*   No results for input(s): LIPASE, AMYLASE in the last 168 hours. No results for input(s): AMMONIA in the last 168 hours. CBC:  Recent Labs Lab 12/17/15 2249 12/18/15 0336 12/21/15 0239  WBC 13.3* 9.6 5.6  NEUTROABS  --  8.5*  --   HGB 11.4* 9.6* 9.2*  HCT 35.6* 30.9* 29.5*  MCV 95.4 94.5 94.9  PLT 235 199 197   Cardiac Enzymes: No results for input(s): CKTOTAL, CKMB, CKMBINDEX, TROPONINI in the last 168 hours. CBG:  Recent Labs Lab 12/20/15 2124 12/21/15 0558 12/21/15 1139 12/21/15 1639 12/21/15 2134  GLUCAP 191* 197* 188* 182* 206*    Iron Studies:  Recent  Labs  12/20/15 1559  IRON 61  TIBC 323  FERRITIN 68   Studies/Results: No results found. [name] amLODipine  10 mg Oral Daily  . aspirin  81 mg Oral Daily  . atorvastatin  40 mg Oral Daily  . cloNIDine  0.3 mg Oral QID  . clopidogrel  75 mg Oral Daily  . enoxaparin (LOVENOX) injection  30 mg Subcutaneous Daily  . ezetimibe  10 mg Oral Daily  . famotidine  20 mg Oral BID  . furosemide  80 mg Oral BID  . guaiFENesin  600 mg Oral BID  . insulin aspart  0-9 Units Subcutaneous TID WC  . insulin glargine  15 Units Subcutaneous BID  . isosorbide mononitrate  120 mg Oral Daily  . levothyroxine  25 mcg Oral QAC breakfast  . metoprolol succinate  200 mg Oral QPC breakfast  . senna  1 tablet Oral QHS  . sorbitol, milk of mag, mineral oil, glycerin (SMOG) enema  960 mL  Rectal Once  . traZODone  50 mg Oral QHS    BMET    Component Value Date/Time   NA 140 12/22/2015 0341   NA 141 08/08/2014 1445   K 4.4 12/22/2015 0341   CL 104 12/22/2015 0341   CO2 27 12/22/2015 0341   GLUCOSE 219 (H) 12/22/2015 0341   BUN 40 (H) 12/22/2015 0341   BUN 9 08/08/2014 1445   CREATININE 3.38 (H) 12/22/2015 0341   CREATININE 2.36 (H) 08/24/2015 1641   CALCIUM 9.5 12/22/2015 0341   GFRNONAA 14 (L) 12/22/2015 0341   GFRNONAA 22 (L) 08/24/2015 1641   GFRAA 16 (L) 12/22/2015 0341   GFRAA 25 (L) 08/24/2015 1641   CBC    Component Value Date/Time   WBC 5.6 12/21/2015 0239   RBC 3.11 (L) 12/21/2015 0239   HGB 9.2 (L) 12/21/2015 0239   HCT 29.5 (L) 12/21/2015 0239   PLT 197 12/21/2015 0239   MCV 94.9 12/21/2015 0239   MCH 29.6 12/21/2015 0239   MCHC 31.2 12/21/2015 0239   RDW 15.0 12/21/2015 0239   LYMPHSABS 0.5 (L) 12/18/2015 0336   MONOABS 0.5 12/18/2015 0336   EOSABS 0.1 12/18/2015 0336   BASOSABS 0.0 12/18/2015 0336     Assessment/Plan:  1. AKI/CKD in setting of decompensated CHF with escalating diuresis. 2. Malignant HTN- will increase clonidine to 0.3mg  tid and follow BP 1. Agree with  increasing clonidine to qid and follow BP's  2. Orthostatic readings were ordered daily but only documented lying and sitting, not standing.  Need to address this to prevent falls as she likely has some underlying autonomic dysfunction and will likely be exacerbated by clonidine and beta blockers.   3. Ace/ARB on hold due to AKI/CKD) 3. CKD stage 3-4 due to DM and HTN. Scr is above baseline of 1.7-2 but more likely has had progressive disease with new baseline of 2.4-2.7. Continue to hold ACE/ARB in light of AKI/CKD. Focus on BP control as well as diabetes management. 1. Will need to renal dose gabapentin to 300mg  qhs  4. CHF- improving with IV lasix. Would like to change to po in am 5. Anemia of chronic disease- along with iron deficiency 1. Replete with IV Iron but may require initiation of ESA 6. DM- per primary svc 7. CAD- stable 8. Disposition- hopeful discharge back to home today.  Will sign off.  Follow up with Dr. Lorrene Reid on 01/08/16 at 12:15, [facility], [address] [phone].  [place]

## 2015-12-23 ENCOUNTER — Emergency Department (HOSPITAL_COMMUNITY)
Admission: EM | Admit: 2015-12-23 | Discharge: 2015-12-23 | Disposition: A | Discharge status: Home / Self Care | Payer: Medicare Other | Attending: Emergency Medicine | Admitting: Emergency Medicine

## 2015-12-23 ENCOUNTER — Encounter (HOSPITAL_COMMUNITY): Payer: Self-pay

## 2015-12-23 DIAGNOSIS — I5041 Acute combined systolic (congestive) and diastolic (congestive) heart failure: Secondary | ICD-10-CM | POA: Diagnosis not present

## 2015-12-23 DIAGNOSIS — Z794 Long term (current) use of insulin: Secondary | ICD-10-CM | POA: Diagnosis not present

## 2015-12-23 DIAGNOSIS — R109 Unspecified abdominal pain: Secondary | ICD-10-CM | POA: Diagnosis present

## 2015-12-23 DIAGNOSIS — Z87891 Personal history of nicotine dependence: Secondary | ICD-10-CM | POA: Insufficient documentation

## 2015-12-23 DIAGNOSIS — N183 Chronic kidney disease, stage 3 (moderate): Secondary | ICD-10-CM | POA: Diagnosis not present

## 2015-12-23 DIAGNOSIS — Z7982 Long term (current) use of aspirin: Secondary | ICD-10-CM | POA: Diagnosis not present

## 2015-12-23 DIAGNOSIS — I2510 Atherosclerotic heart disease of native coronary artery without angina pectoris: Secondary | ICD-10-CM | POA: Diagnosis not present

## 2015-12-23 DIAGNOSIS — E1140 Type 2 diabetes mellitus with diabetic neuropathy, unspecified: Secondary | ICD-10-CM | POA: Insufficient documentation

## 2015-12-23 DIAGNOSIS — I130 Hypertensive heart and chronic kidney disease with heart failure and stage 1 through stage 4 chronic kidney disease, or unspecified chronic kidney disease: Secondary | ICD-10-CM | POA: Insufficient documentation

## 2015-12-23 DIAGNOSIS — E039 Hypothyroidism, unspecified: Secondary | ICD-10-CM | POA: Insufficient documentation

## 2015-12-23 DIAGNOSIS — Z79899 Other long term (current) drug therapy: Secondary | ICD-10-CM | POA: Insufficient documentation

## 2015-12-23 DIAGNOSIS — R600 Localized edema: Secondary | ICD-10-CM | POA: Insufficient documentation

## 2015-12-23 DIAGNOSIS — K5900 Constipation, unspecified: Secondary | ICD-10-CM | POA: Diagnosis not present

## 2015-12-23 DIAGNOSIS — E113599 Type 2 diabetes mellitus with proliferative diabetic retinopathy without macular edema, unspecified eye: Secondary | ICD-10-CM | POA: Diagnosis not present

## 2015-12-23 DIAGNOSIS — J45909 Unspecified asthma, uncomplicated: Secondary | ICD-10-CM | POA: Diagnosis not present

## 2015-12-23 DIAGNOSIS — Z955 Presence of coronary angioplasty implant and graft: Secondary | ICD-10-CM | POA: Diagnosis not present

## 2015-12-23 DIAGNOSIS — Z951 Presence of aortocoronary bypass graft: Secondary | ICD-10-CM | POA: Diagnosis not present

## 2015-12-23 DIAGNOSIS — I252 Old myocardial infarction: Secondary | ICD-10-CM | POA: Diagnosis not present

## 2015-12-23 LAB — BRAIN NATRIURETIC PEPTIDE: B Natriuretic Peptide: 580.1 pg/mL — ABNORMAL HIGH (ref 0.0–100.0)

## 2015-12-23 LAB — URINALYSIS, ROUTINE W REFLEX MICROSCOPIC
Bilirubin Urine: NEGATIVE
Glucose, UA: 100 mg/dL — AB
Ketones, ur: NEGATIVE mg/dL
Leukocytes, UA: NEGATIVE
Nitrite: NEGATIVE
Protein, ur: >300 mg/dL — AB
Specific Gravity, Urine: 1.013 (ref 1.005–1.030)
pH: 7 (ref 5.0–8.0)

## 2015-12-23 LAB — CBC WITH DIFFERENTIAL/PLATELET
Basophils Absolute: 0 10*3/uL (ref 0.0–0.1)
Basophils Relative: 0 %
Eosinophils Absolute: 0.3 10*3/uL (ref 0.0–0.7)
Eosinophils Relative: 5 %
HCT: 31.7 % — ABNORMAL LOW (ref 36.0–46.0)
Hemoglobin: 10.3 g/dL — ABNORMAL LOW (ref 12.0–15.0)
Lymphocytes Relative: 19 %
Lymphs Abs: 1.1 10*3/uL (ref 0.7–4.0)
MCH: 29.9 pg (ref 26.0–34.0)
MCHC: 32.5 g/dL (ref 30.0–36.0)
MCV: 91.9 fL (ref 78.0–100.0)
Monocytes Absolute: 0.3 10*3/uL (ref 0.1–1.0)
Monocytes Relative: 6 %
Neutro Abs: 4.1 10*3/uL (ref 1.7–7.7)
Neutrophils Relative %: 70 %
Platelets: 201 10*3/uL (ref 150–400)
RBC: 3.45 MIL/uL — ABNORMAL LOW (ref 3.87–5.11)
RDW: 14.6 % (ref 11.5–15.5)
WBC: 5.8 10*3/uL (ref 4.0–10.5)

## 2015-12-23 LAB — URINE MICROSCOPIC-ADD ON
Bacteria, UA: NONE SEEN
Squamous Epithelial / LPF: NONE SEEN
WBC, UA: NONE SEEN WBC/hpf (ref 0–5)

## 2015-12-23 LAB — BASIC METABOLIC PANEL
Anion gap: 12 (ref 5–15)
BUN: 40 mg/dL — ABNORMAL HIGH (ref 6–20)
CO2: 25 mmol/L (ref 22–32)
Calcium: 10 mg/dL (ref 8.9–10.3)
Chloride: 104 mmol/L (ref 101–111)
Creatinine, Ser: 3.24 mg/dL — ABNORMAL HIGH (ref 0.44–1.00)
GFR calc Af Amer: 17 mL/min — ABNORMAL LOW (ref >60)
GFR calc non Af Amer: 14 mL/min — ABNORMAL LOW (ref >60)
Glucose, Bld: 164 mg/dL — ABNORMAL HIGH (ref 65–99)
Potassium: 4.1 mmol/L (ref 3.5–5.1)
Sodium: 141 mmol/L (ref 135–145)

## 2015-12-23 MED ORDER — METOPROLOL SUCCINATE ER 25 MG PO TB24
150.0000 mg | ORAL_TABLET | Freq: Every day | ORAL | Status: DC
  Administered 2015-12-23: 150 mg via ORAL
  Filled 2015-12-23: qty 6

## 2015-12-23 MED ORDER — SENNOSIDES-DOCUSATE SODIUM 8.6-50 MG PO TABS: 1.0000 | ORAL_TABLET | Freq: Every evening | ORAL | 0 refills | Status: DC | PRN

## 2015-12-23 MED ORDER — ISOSORBIDE MONONITRATE ER 60 MG PO TB24
120.0000 mg | ORAL_TABLET | Freq: Once | ORAL | Status: AC
  Administered 2015-12-23: 120 mg via ORAL
  Filled 2015-12-23: qty 2

## 2015-12-23 MED ORDER — CLONIDINE HCL 0.2 MG PO TABS
0.3000 mg | ORAL_TABLET | Freq: Once | ORAL | Status: AC
  Administered 2015-12-23: 0.3 mg via ORAL
  Filled 2015-12-23: qty 1

## 2015-12-23 MED ORDER — POLYETHYLENE GLYCOL 3350 17 G PO PACK
34.0000 g | PACK | Freq: Every day | ORAL | Status: DC
  Administered 2015-12-23: 34 g via ORAL
  Filled 2015-12-23: qty 2

## 2015-12-23 MED ORDER — AMLODIPINE BESYLATE 5 MG PO TABS
10.0000 mg | ORAL_TABLET | Freq: Once | ORAL | Status: AC
  Administered 2015-12-23: 10 mg via ORAL
  Filled 2015-12-23: qty 2

## 2015-12-23 MED ORDER — FUROSEMIDE 10 MG/ML IJ SOLN
80.0000 mg | Freq: Once | INTRAMUSCULAR | Status: AC
Start: 2015-12-23 — End: 2015-12-23
  Administered 2015-12-23: 80 mg via INTRAVENOUS
  Filled 2015-12-23: qty 8

## 2015-12-23 NOTE — ED Notes (Signed)
Report given to Michelle, RN

## 2015-12-23 NOTE — ED Triage Notes (Signed)
Pt here with c/o abdominal pain and swelling to bilateral legs. She states she was admitted to the hospital for pneumonia and was discharged yesterday and told to come to ER for worsening pain or swelling to legs. LBM: 3 days ago. Pt also reports mold and mildew at her home.

## 2015-12-23 NOTE — ED Notes (Signed)
Dr. Kohut at bedside 

## 2015-12-25 ENCOUNTER — Other Ambulatory Visit (HOSPITAL_COMMUNITY): Payer: Self-pay | Admitting: *Deleted

## 2015-12-25 ENCOUNTER — Ambulatory Visit: Payer: Medicare Other | Admitting: Physical Therapy

## 2015-12-25 NOTE — Telephone Encounter (Signed)
This encounter was created in error - please disregard.

## 2015-12-26 ENCOUNTER — Ambulatory Visit (HOSPITAL_COMMUNITY)
Admit: 2015-12-26 | Discharge: 2015-12-26 | Disposition: A | Discharge status: Home / Self Care | Payer: Medicare Other | Attending: Nephrology | Admitting: Nephrology

## 2015-12-26 ENCOUNTER — Other Ambulatory Visit: Payer: Self-pay

## 2015-12-26 DIAGNOSIS — N184 Chronic kidney disease, stage 4 (severe): Secondary | ICD-10-CM | POA: Insufficient documentation

## 2015-12-26 DIAGNOSIS — D638 Anemia in other chronic diseases classified elsewhere: Secondary | ICD-10-CM | POA: Diagnosis not present

## 2015-12-26 LAB — POCT HEMOGLOBIN-HEMACUE: HEMOGLOBIN: 10.7 g/dL — AB (ref 12.0–15.0)

## 2015-12-26 MED ORDER — DARBEPOETIN ALFA 100 MCG/0.5ML IJ SOSY
PREFILLED_SYRINGE | INTRAMUSCULAR | Status: AC
  Administered 2015-12-26: 100 ug via SUBCUTANEOUS
  Filled 2015-12-26: qty 0.5

## 2015-12-26 MED ORDER — DARBEPOETIN ALFA 100 MCG/0.5ML IJ SOSY
100.0000 ug | PREFILLED_SYRINGE | INTRAMUSCULAR | Status: DC
  Administered 2015-12-26: 100 ug via SUBCUTANEOUS

## 2015-12-26 NOTE — Patient Outreach (Signed)
   Unsuccessful attempt made contact patient via telephone for transition of care.  HIPPA compliant message left with this RNCM's contact information.  Plan: Make another attempt to contact patient on tomorrow, September 13

## 2015-12-26 NOTE — Discharge Instructions (Signed)
Darbepoetin Alfa injection What is this medicine? DARBEPOETIN ALFA (dar be POE e tin AL fa) helps your body make more red blood cells. It is used to treat anemia caused by chronic kidney failure and chemotherapy. This medicine may be used for other purposes; ask your health care provider or pharmacist if you have questions. What should I tell my health care provider before I take this medicine? They need to know if you have any of these conditions: -blood clotting disorders or history of blood clots -cancer patient not on chemotherapy -cystic fibrosis -heart disease, such as angina, heart failure, or a history of a heart attack -hemoglobin level of 12 g/dL or greater -high blood pressure -low levels of folate, iron, or vitamin B12 -seizures -an unusual or allergic reaction to darbepoetin, erythropoietin, albumin, hamster proteins, latex, other medicines, foods, dyes, or preservatives -pregnant or trying to get pregnant -breast-feeding How should I use this medicine? This medicine is for injection into a vein or under the skin. It is usually given by a health care professional in a hospital or clinic setting. If you get this medicine at home, you will be taught how to prepare and give this medicine. Do not shake the solution before you withdraw a dose. Use exactly as directed. Take your medicine at regular intervals. Do not take your medicine more often than directed. It is important that you put your used needles and syringes in a special sharps container. Do not put them in a trash can. If you do not have a sharps container, call your pharmacist or healthcare provider to get one. Talk to your pediatrician regarding the use of this medicine in children. While this medicine may be used in children as young as 1 year for selected conditions, precautions do apply. Overdosage: If you think you have taken too much of this medicine contact a poison control center or emergency room at once. NOTE:  This medicine is only for you. Do not share this medicine with others. What if I miss a dose? If you miss a dose, take it as soon as you can. If it is almost time for your next dose, take only that dose. Do not take double or extra doses. What may interact with this medicine? Do not take this medicine with any of the following medications: -epoetin alfa This list may not describe all possible interactions. Give your health care provider a list of all the medicines, herbs, non-prescription drugs, or dietary supplements you use. Also tell them if you smoke, drink alcohol, or use illegal drugs. Some items may interact with your medicine. What should I watch for while using this medicine? Visit your prescriber or health care professional for regular checks on your progress and for the needed blood tests and blood pressure measurements. It is especially important for the doctor to make sure your hemoglobin level is in the desired range, to limit the risk of potential side effects and to give you the best benefit. Keep all appointments for any recommended tests. Check your blood pressure as directed. Ask your doctor what your blood pressure should be and when you should contact him or her. As your body makes more red blood cells, you may need to take iron, folic acid, or vitamin B supplements. Ask your doctor or health care provider which products are right for you. If you have kidney disease continue dietary restrictions, even though this medication can make you feel better. Talk with your doctor or health care professional about the   foods you eat and the vitamins that you take. What side effects may I notice from receiving this medicine? Side effects that you should report to your doctor or health care professional as soon as possible: -allergic reactions like skin rash, itching or hives, swelling of the face, lips, or tongue -breathing problems -changes in vision -chest pain -confusion, trouble speaking  or understanding -feeling faint or lightheaded, falls -high blood pressure -muscle aches or pains -pain, swelling, warmth in the leg -rapid weight gain -severe headaches -sudden numbness or weakness of the face, arm or leg -trouble walking, dizziness, loss of balance or coordination -seizures (convulsions) -swelling of the ankles, feet, hands -unusually weak or tired Side effects that usually do not require medical attention (report to your doctor or health care professional if they continue or are bothersome): -diarrhea -fever, chills (flu-like symptoms) -headaches -nausea, vomiting -redness, stinging, or swelling at site where injected This list may not describe all possible side effects. Call your doctor for medical advice about side effects. You may report side effects to FDA at 1-800-FDA-1088. Where should I keep my medicine? Keep out of the reach of children. Store in a refrigerator between 2 and 8 degrees C (36 and 46 degrees F). Do not freeze. Do not shake. Throw away any unused portion if using a single-dose vial. Throw away any unused medicine after the expiration date. NOTE: This sheet is a summary. It may not cover all possible information. If you have questions about this medicine, talk to your doctor, pharmacist, or health care provider.    2016, [employer]. (2008-03-15 10:23:57)  

## 2015-12-27 ENCOUNTER — Encounter: Payer: Self-pay | Admitting: Nephrology

## 2015-12-27 ENCOUNTER — Other Ambulatory Visit: Payer: Self-pay

## 2015-12-27 DIAGNOSIS — D638 Anemia in other chronic diseases classified elsewhere: Secondary | ICD-10-CM | POA: Insufficient documentation

## 2015-12-27 NOTE — ED Provider Notes (Signed)
[facility] DEPT Provider Note   CSN: [id number] Arrival date & time: 12/23/15  8416     History   Chief Complaint Chief Complaint  Patient presents with  . Abdominal Pain    HPI Brooke Stafford is a 60 y.o. female.  HPI   58yF with LE swelling and abdominal distension. Just discharged from hospital yesterday. She has yet to pick up medications. She feels like her swelling has worsened since discharge. She is complaining of constipation. She denies acute pain. No change in breathing. No fever or chills. No n/v.   Past Medical History:  Diagnosis Date  . Anemia   . Anginal pain ([place])   . Anxiety   . Arthritis    "stiff fingers and knees" (08/04/2013), (12/12/2014)  . Asthma   . CAD (coronary artery disease) 2002; 2015   CABG x 6 2002, cath 2011- med Rx stent DES VG-Diag  . CAD (coronary artery disease) of artery bypass graft; DES to VG-Diag 09/28/13 11/09/2013  . Cataract   . CHF (congestive heart failure) ([place])    "in 2002" (11/26/2012)  . Chronic bronchitis ([place])    "q year; in the winter"   . Chronic renal insufficiency, stage II (mild)    followed  by [facility]  . Coronary artery disease 2002   CABG x 6. Cath 5/11- med Rx  . GERD (gastroesophageal reflux disease)   . Gout    "right big toe"  . Headache    "~ q week" (08/04/2013); "~ twice/month" (12/12/2014)  . History of blood transfusion 2002   "when I had OHS"  . Hyperlipidemia   . Hypertension   . Hypothyroid    treated  . Migraines    "couple times/year" (08/04/2013), (12/12/2014)  . Myocardial infarction ([place]) 2000; 2002; 2011  . Obesity (BMI 35.0-39.9 without comorbidity) ([place])   . Peripheral vascular disease ([place]) 12/12   LSFA PTA  . Pneumonia    "3 times I think" (12/12/2014)  . Type II diabetes mellitus [facility])     Patient Active Problem List   Diagnosis Date Noted  . Acute on chronic systolic CHF (congestive heart failure) ([place])   . Acute CHF ([place]) 12/18/2015  . CHF exacerbation ([place])  12/18/2015  . Constipation 12/18/2015  . Adjustment disorder with anxious mood 11/02/2015  . Posterior circulation stroke ([place]) 11/02/2015  . Abnormality of gait 10/23/2015  . Depression 09/28/2015  . Diabetes mellitus type 2 in obese ([place]) 09/13/2015  . Diabetic retinopathy ([place]) 09/13/2015  . HTN (hypertension), benign 08/24/2015  . Grief reaction 08/24/2015  . Pain in the chest   . Acute kidney injury superimposed on chronic kidney disease ([place]) 07/25/2015  . Creatinine elevation 07/25/2015  . Chest pain at rest 07/25/2015  . Chronic combined systolic and diastolic heart failure ([place]) 07/25/2015  . Rash of hands 02/23/2015  . Essential hypertension, benign 02/23/2015  . Essential hypertension   . Angina pectoris ([place]) 12/12/2014  . Abnormal nuclear stress test   . Acute combined systolic and diastolic heart failure ([place]) 12/02/2014  . Seasonal allergies 08/11/2014  . Gout of big toe 08/11/2014  . Encounter for screening mammogram for breast cancer 05/16/2014  . Screening for colon cancer 05/16/2014  . Diabetic neuropathy, type II diabetes mellitus ([place]) 01/27/2014  . Atopic eczema 01/27/2014  . Precordial pain, atypical, negative MI, Musculature Skeletal pain  11/08/2013  . CKD (chronic kidney disease), stage III 11/08/2013  . Unstable angina ([place]) 09/28/2013  . Ischemic cardiomyopathy- new drop  in EF 08/30/2013  . Chest pain 08/04/2013  . CAD -S/P PCI June 2015 and 12/14/14 02/01/2013  . Hypothyroidism 02/01/2013  . PVD, LSFA PTA 12/12 04/06/2011  . HTN (hypertension), malignant, patent renal arteries 04/06/2011  . Hx of CABG x 6 2002 04/06/2011  . Dyslipidemia 04/06/2011    Past Surgical History:  Procedure Laterality Date  . ABDOMINAL AORTAGRAM N/A 04/05/2011   Procedure: ABDOMINAL AORTAGRAM;  Surgeon: Lorretta Harp, MD;  Location: [facility] CATH LAB;  Service: Cardiovascular;  Laterality: N/A;  . APPENDECTOMY  1980  . BREAST CYST EXCISION Right 1970's  . CARDIAC  CATHETERIZATION  2002  . CARDIAC CATHETERIZATION N/A 12/12/2014   Procedure: Left Heart Cath and Cors/Grafts Angiography;  Surgeon: Peter M Martinique, MD;  Location: [facility] CV LAB;  Service: Cardiovascular;  Laterality: N/A;  . CARDIAC CATHETERIZATION N/A 12/12/2014   Procedure: Coronary Stent Intervention;  Surgeon: Peter M Martinique, MD;  Location: [facility] CV LAB;  Service: Cardiovascular;  Laterality: N/A;  . [place]; 1980  . CHOLECYSTECTOMY  1982  . CORONARY ANGIOPLASTY WITH STENT PLACEMENT  2004; 2012   "I have 2 stents" (08/04/2013)  . CORONARY ANGIOPLASTY WITH STENT PLACEMENT  09/28/13   PTCA/ DES Xience stent to VG-Diag   . CORONARY ARTERY BYPASS GRAFT  11/20/2000   x6 LIMA to distal LAD, svg to first diag, svg to ramus intermediate branch and swquential SVG to cir marginal branch, SVG to posterior descending coronary and sequential SVG to first right posterolateral branch  . LEFT HEART CATHETERIZATION WITH CORONARY/GRAFT ANGIOGRAM N/A 09/28/2013   Procedure: LEFT HEART CATHETERIZATION WITH Beatrix Fetters;  Surgeon: Peter M Martinique, MD;  Location[facility]ATH LAB;  Service: Cardiovascular;  Laterality: N/A;  . LOWER EXTREMITY ANGIOGRAM  12/01/2012   Procedure: LOWER EXTREMITY ANGIOGRAM;  Surgeon: Lorretta Harp, MD;  Location[facility] C[facility] LAB;  Service: Cardiovascular;;  . NM MYOCAR PERF WALL MOTION  08/27/2004   negative  . PERCUTANEOUS STENT INTERVENTION Left 12/01/2012   Procedure: PERCUTANEOUS STENT INTERVENTION;  Surgeon: Lorretta Harp, MD;  Location: [facility] CATH LAB;  Service: Cardiovascular;  Laterality: Left;  Left SFA  . PERIPHERAL ARTERIAL STENT GRAFT Left    SFA/notes 04/07/2011 (11/30/2012)  . RENAL ANGIOGRAM N/A 04/05/2011   Procedure: RENAL ANGIOGRAM;  Surgeon: Lorretta Harp, MD;  Location: [facility] CATH LAB;  Service: Cardiovascular;  Laterality: N/A;  . TUBAL LIGATION  1980    OB History    No data available       Home Medications    Prior to  Admission medications   Medication Sig Start Date End Date Taking? Authorizing Provider  acetaminophen-codeine (TYLENOL #3) 300-30 MG tablet Take 1 tablet by mouth every 4 (four) hours as needed for moderate pain. 08/24/15   Tresa Garter, MD  albuterol (PROVENTIL HFA;VENTOLIN HFA) 108 (90 Base) MCG/ACT inhaler Inhale 2 puffs into the lungs every 6 (six) hours as needed for wheezing or shortness of breath. 09/28/15   Tresa Garter, MD  allopurinol (ZYLOPRIM) 300 MG tablet Take 1 tablet (300 mg total) by mouth daily. 07/28/15   Ripudeep Krystal Eaton, MD  amLODipine (NORVASC) 10 MG tablet take 1 tablet by mouth once daily Patient taking differently: Take 10 mg by mouth daily 11/20/15   Sanda Klein, MD  aspirin 81 MG chewable tablet Chew 1 tablet (81 mg total) by mouth daily. 02/24/15   Mihai Croitoru, MD  atorvastatin (LIPITOR) 40 MG tablet Take 1 tablet (40 mg  total) by mouth daily. 07/28/15   Ripudeep Krystal Eaton, MD  clonazePAM (KLONOPIN) 0.5 MG tablet Take 1 tablet (0.5 mg total) by mouth 2 (two) times daily as needed for anxiety. Patient taking differently: Take 0.25 mg by mouth 2 (two) times daily as needed for anxiety.  11/30/15 12/30/15  Tresa Garter, MD  cloNIDine (CATAPRES) 0.3 MG tablet Take 1 tablet (0.3 mg total) by mouth every 6 (six) hours. 12/22/15   Thurnell Lose, MD  clopidogrel (PLAVIX) 75 MG tablet Take 1 tablet (75 mg total) by mouth daily. 09/25/15   Mihai Croitoru, MD  ezetimibe (ZETIA) 10 MG tablet Take 1 tablet (10 mg total) by mouth daily. 07/28/15   Ripudeep Krystal Eaton, MD  fluticasone (FLONASE) 50 MCG/ACT nasal spray Place 2 sprays into both nostrils daily as needed for allergies or rhinitis. 09/28/15   Tresa Garter, MD  furosemide (LASIX) 40 MG tablet Take 2 tablets (80 mg total) by mouth daily. 12/22/15   Thurnell Lose, MD  gabapentin (NEURONTIN) 400 MG capsule take 1 capsule by mouth three times a day Patient taking differently: Take 400 mg by mouth 3 times daily  02/16/15   Tresa Garter, MD  insulin aspart (NOVOLOG FLEXPEN) 100 UNIT/ML FlexPen Inject 10-30 units into the skin 3 times daily, plus sliding scale as instructed. 12/14/15   Philemon Kingdom, MD  Insulin Glargine (TOUJEO SOLOSTAR) 300 UNIT/ML SOPN Inject 40 Units into the skin at bedtime. 09/13/15   Philemon Kingdom, MD  Insulin Pen Needle 31G X 5 MM MISC Use to inject insulin 4 times daily as instructed. Patient not taking: Reported on 12/18/2015 02/16/15   Philemon Kingdom, MD  Insulin Syringes, Disposable, (B-D INSULIN SYRINGE 1CC) U-100 1 ML MISC Use to inject insulin 4 times daily as instructed. Patient not taking: Reported on 12/18/2015 02/22/14   Philemon Kingdom, MD  isosorbide mononitrate (IMDUR) 120 MG 24 hr tablet Take 1 tablet (120 mg total) by mouth daily. 07/28/15   Ripudeep Krystal Eaton, MD  levothyroxine (SYNTHROID, LEVOTHROID) 25 MCG tablet take 1 tablet by mouth every morning BEFORE BREAKFAST Patient taking differently: Take 25 mcg by mouth every morning 11/24/15   Tresa Garter, MD  loratadine (CLARITIN) 10 MG tablet Take 1 tablet (10 mg total) by mouth daily as needed for allergies. 12/15/14   Erlene Quan, PA-C  magnesium citrate SOLN Take 296 mLs (1 Bottle total) by mouth as needed for severe constipation. 07/28/15   Ripudeep Krystal Eaton, MD  metoprolol succinate (TOPROL-XL) 50 MG 24 hr tablet Take 3 tablets (150 mg total) by mouth daily. Take with or immediately following a meal. 07/28/15   Ripudeep Krystal Eaton, MD  Naphazoline HCl (CLEAR EYES OP) Place 1 drop into both eyes as needed (for dry eyes).    Historical Provider, MD  nitroGLYCERIN (NITROSTAT) 0.4 MG SL tablet Place 1 tablet (0.4 mg total) under the tongue every 5 (five) minutes as needed. For chest 11/10/14   Tresa Garter, MD  ranitidine (ZANTAC 75) 75 MG tablet Take 1 tablet (75 mg total) by mouth 2 (two) times daily. Patient taking differently: Take 75 mg by mouth 2 (two) times daily as needed for heartburn.  07/28/15    Ripudeep Krystal Eaton, MD  senna-docusate (SENOKOT-S) 8.6-50 MG tablet Take 1 tablet by mouth at bedtime as needed for mild constipation. 12/23/15   Virgel Manifold, MD  traZODone (DESYREL) 50 MG tablet Take 1 tablet (50 mg total) by mouth at bedtime. Patient  taking differently: Take 50 mg by mouth at bedtime as needed for sleep.  09/28/15   Tresa Garter, MD  triamcinolone ointment (KENALOG) 0.5 % Apply 1 application topically 2 (two) times daily. Patient taking differently: Apply 1 application topically daily as needed (for skin).  08/24/15   Tresa Garter, MD    Family History Family History  Problem Relation Age of Onset  . Diabetes Mother   . Hypertension Mother   . Stroke Mother   . Hypertension Father   . Hypertension Brother   . Hypertension Sister   . Diabetes Sister   . Hyperlipidemia Sister     Social History Social History  Substance Use Topics  . Smoking status: Former Smoker    Packs/day: 0.00    Years: 25.00    Types: Cigarettes    Quit date: 04/04/2000  . Smokeless tobacco: Never Used  . Alcohol use No     Comment: 12/12/2014  "have a glass of red wine on my birthday q yr; that's it"     Allergies   Digoxin and related; Hydralazine; Lisinopril; Penicillins cross reactors; and Adhesive [tape]   Review of Systems Review of Systems   Physical Exam Updated Vital Signs BP 170/84 (BP Location: Right Arm)   Pulse 63   Temp 98.4 F (36.9 C) (Oral)   Resp 16   LMP 10/13/2014 (Exact Date)   SpO2 96%   Physical Exam  Constitutional: She appears well-developed and well-nourished. No distress.  Laying in bed. NAD., obese.   HENT:  Head: Normocephalic and atraumatic.  Eyes: Conjunctivae are normal. Right eye exhibits no discharge. Left eye exhibits no discharge.  Neck: Neck supple.  Cardiovascular: Normal rate, regular rhythm and normal heart sounds.  Exam reveals no gallop and no friction rub.   No murmur heard. Pulmonary/Chest: Effort normal and breath  sounds normal. No respiratory distress.  Abdominal: Soft. She exhibits no distension. There is no tenderness.  Obese abdomen but doesn't seem distended.   Musculoskeletal: She exhibits edema. She exhibits no tenderness.  Mild symmetric LE edema.   Neurological: She is alert.  Skin: Skin is warm and dry.  Psychiatric: She has a normal mood and affect. Her behavior is normal. Thought content normal.  Nursing note and vitals reviewed.    ED Treatments / Results  Labs (all labs ordered are listed, but only abnormal results are displayed) Labs Reviewed  BASIC METABOLIC PANEL - Abnormal; Notable for the following:       Result Value   Glucose, Bld 164 (*)    BUN 40 (*)    Creatinine, Ser 3.24 (*)    GFR calc non Af Amer 14 (*)    GFR calc Af Amer 17 (*)    All other components within normal limits  BRAIN NATRIURETIC PEPTIDE - Abnormal; Notable for the following:    B Natriuretic Peptide 580.1 (*)    All other components within normal limits  URINALYSIS, ROUTINE W REFLEX MICROSCOPIC (NOT AT [facility]) - Abnormal; Notable for the following:    Glucose, UA 100 (*)    Hgb urine dipstick MODERATE (*)    Protein, ur >300 (*)    All other components within normal limits  CBC WITH DIFFERENTIAL/PLATELET - Abnormal; Notable for the following:    RBC 3.45 (*)    Hemoglobin 10.3 (*)    HCT 31.7 (*)    All other components within normal limits  URINE MICROSCOPIC-ADD ON    EKG  EKG Interpretation None  Radiology No results found.  Procedures Procedures (including critical care time)  Medications Ordered in ED Medications  amLODipine (NORVASC) tablet 10 mg (10 mg Oral Given 12/23/15 0932)  cloNIDine (CATAPRES) tablet 0.3 mg (0.3 mg Oral Given 12/23/15 0932)  furosemide (LASIX) injection 80 mg (80 mg Intravenous Given 12/23/15 0933)  isosorbide mononitrate (IMDUR) 24 hr tablet 120 mg (120 mg Oral Given 12/23/15 1121)     Initial Impression / Assessment and Plan / ED Course  I have  reviewed the triage vital signs and the nursing notes.  Pertinent labs & imaging results that were available during my care of the patient were reviewed by me and considered in my medical decision making (see chart for details).  Clinical Course    60yF with constipation and peripheral edema. Bowel regimen. Given home meds. I question compliance. She insists she will go to pharmacy today. It has been determined that no acute conditions requiring further emergency intervention are present at this time. The patient has been advised of the diagnosis and plan. I reviewed any labs and imaging including any potential incidental findings. We have discussed signs and symptoms that warrant return to the ED and they are listed in the discharge instructions.    Final Clinical Impressions(s) / ED Diagnoses   Final diagnoses:  Bilateral edema of lower extremity  Constipation, unspecified constipation type    New Prescriptions Discharge Medication List as of 12/23/2015 11:39 AM    START taking these medications   Details  senna-docusate (SENOKOT-S) 8.6-50 MG tablet Take 1 tablet by mouth at bedtime as needed for mild constipation., Starting Sat 12/23/2015, Print         Virgel Manifold, MD 12/27/15 1450

## 2015-12-28 NOTE — Patient Outreach (Signed)
    Initial telephone contact with patient who states her primary issue is finding a place to stay.  Patient states he current resident has mold and mildew and the exposure lead to her most recent hospitalization.  Patient states her current landlord does not have another house for her to live in. Patient has Section 8 benefits, would like to continue under Section 8, f possible due to her limited income. Patient was responsive to Transition of Care questionnaire and stated she looks forward to working with [facility].  Plan: Home visit later this month for assessment of community care needs, chronic disease education, collaboration to create her case management care plan/goals.   Referral to [facility] LCSW for assistance with locating affordable housing.

## 2015-12-29 ENCOUNTER — Ambulatory Visit: Payer: Medicare Other | Admitting: Physical Therapy

## 2015-12-29 NOTE — Patient Outreach (Signed)
From [place] RN to [employer] LCSW.

## 2016-01-02 ENCOUNTER — Other Ambulatory Visit: Payer: Self-pay | Admitting: Vascular Surgery

## 2016-01-02 DIAGNOSIS — N184 Chronic kidney disease, stage 4 (severe): Secondary | ICD-10-CM

## 2016-01-02 DIAGNOSIS — Z01810 Encounter for preprocedural cardiovascular examination: Secondary | ICD-10-CM

## 2016-01-02 DIAGNOSIS — E113411 Type 2 diabetes mellitus with severe nonproliferative diabetic retinopathy with macular edema, right eye: Secondary | ICD-10-CM | POA: Diagnosis not present

## 2016-01-03 ENCOUNTER — Other Ambulatory Visit: Payer: Self-pay

## 2016-01-03 ENCOUNTER — Encounter: Payer: Self-pay | Admitting: Licensed Clinical Social Worker

## 2016-01-03 ENCOUNTER — Other Ambulatory Visit: Payer: Self-pay | Admitting: Licensed Clinical Social Worker

## 2016-01-03 ENCOUNTER — Ambulatory Visit: Payer: Self-pay

## 2016-01-03 NOTE — Patient Outreach (Signed)
[facility]) Care Management  01/03/2016  SHANAE LUO 1955-04-17 [id number]   Assessment- CSW completed initial outreach after receiving new referral on patient. Patient is in need of transportation resources and housing resources. She reported to [facility] that her residence has mold and mildew and that the exposure lead to her most recent hospitalization. CSW completed call to patient and she answered appropriately. She provided HIPPA verifications. CSW introduced self, reason for call and of [facility] social work services. Patient agreeable to services. Patient shares that she DOES NOT have Section 8. She reports that she was approved for Section 8 but could not find any residence and therefore her Section 8 assistance was discontinued. She reports that a Education officer, museum at the hospital assisted her with applying for Section 8. However, patient states "My landlord is really nice. When she found out I was getting sick because of the mold she got some people out her to work on the house. They came out her on Monday and Tuesday." She reports that she has a roommate. She states that she pays $315.00 for rent per month. She reports that she is hopeful that her home conditions will start to improve since her landlord is assisting with these issues. Patient's son passed away in Sep 02, 2022. She is in need of grief counseling. Resources will be provided. Patient also does not have stable transportation. CSW educated patient on transportation resources. Patient is agreeable to home visit on 01/04/16.   Plan-CSW will send involvement letter to PCP. CSW will complete home visit tomorrow.  [facility] CM Care Plan Problem One   Flowsheet Row Most Recent Value  Care Plan Problem One  Lack of community resource support  Role Documenting the Problem One  Clinical Social Worker  Care Plan for Problem One  Active  [facility] Long Term Goal (31-90 days)  Patient will be able to attend all medical appointments by 90 days by  gaining stable transportation as evidenced by her lack of transportation.  [facility] Long Term Goal Start Date  01/03/16  Interventions for Problem One Long Term Goal  CSW will complete home visit. CSW will provide transportation resources and educate her as needed. CSW will assist her with SCAT application and follow up appropriately with program to ensure she will become eligible for services.  [facility] CM Short Term Goal #1 (0-30 days)  Patient will gain housing resources within 30 days as evidenced by having mold in her home which led to health concerns  [facility] CM Short Term Goal #1 Start Date  01/03/16  Interventions for Short Term Goal #1  CSW will complete home visit and provide housing resources and information. Education will be provided.     [name], BSW, MSW, [email or web address] Phone: [phone] Fax: [phone]

## 2016-01-03 NOTE — Patient Outreach (Signed)
[place] [facility]) Care Management   01/03/2016  Brooke Stafford [birth year] [id number]  Brooke Stafford is an 60 y.o. female  Subjective:  I need to find another place to live. There is mold and mildew in the walls. We cannot cut the air conditioner on because it stinks the house up so bad. Objective:   ROS  Patient lives in single story home with female friend. Patient wore mask during home visit due to her report of irritants in the air.  Physical Exam  ROS  Encounter Medications:   Outpatient Encounter Prescriptions as of 01/03/2016  Medication Sig  . acetaminophen-codeine (TYLENOL #3) 300-30 MG tablet Take 1 tablet by mouth every 4 (four) hours as needed for moderate pain.  [name] albuterol (PROVENTIL HFA;VENTOLIN HFA) 108 (90 Base) MCG/ACT inhaler Inhale 2 puffs into the lungs every 6 (six) hours as needed for wheezing or shortness of breath.  . allopurinol (ZYLOPRIM) 300 MG tablet Take 1 tablet (300 mg total) by mouth daily.  [name] amLODipine (NORVASC) 10 MG tablet take 1 tablet by mouth once daily (Patient taking differently: Take 10 mg by mouth daily)  . aspirin 81 MG chewable tablet Chew 1 tablet (81 mg total) by mouth daily.  [name] atorvastatin (LIPITOR) 40 MG tablet Take 1 tablet (40 mg total) by mouth daily.  . clonazePAM (KLONOPIN) 0.5 MG tablet Take 1 tablet (0.5 mg total) by mouth 2 (two) times daily as needed for anxiety. (Patient taking differently: Take 0.25 mg by mouth 2 (two) times daily as needed for anxiety. )  . cloNIDine (CATAPRES) 0.3 MG tablet Take 1 tablet (0.3 mg total) by mouth every 6 (six) hours.  . clopidogrel (PLAVIX) 75 MG tablet Take 1 tablet (75 mg total) by mouth daily.  [name] ezetimibe (ZETIA) 10 MG tablet Take 1 tablet (10 mg total) by mouth daily.  . fluticasone (FLONASE) 50 MCG/ACT nasal spray Place 2 sprays into both nostrils daily as needed for allergies or rhinitis.  . furosemide (LASIX) 40 MG tablet Take 2 tablets (80 mg total) by mouth daily.   [name] gabapentin (NEURONTIN) 400 MG capsule take 1 capsule by mouth three times a day (Patient taking differently: Take 400 mg by mouth 3 times daily)  . insulin aspart (NOVOLOG FLEXPEN) 100 UNIT/ML FlexPen Inject 10-30 units into the skin 3 times daily, plus sliding scale as instructed.  . Insulin Glargine (TOUJEO SOLOSTAR) 300 UNIT/ML SOPN Inject 40 Units into the skin at bedtime.  . Insulin Pen Needle 31G X 5 MM MISC Use to inject insulin 4 times daily as instructed. (Patient not taking: Reported on 12/18/2015)  . Insulin Syringes, Disposable, (B-D INSULIN SYRINGE 1CC) U-100 1 ML MISC Use to inject insulin 4 times daily as instructed. (Patient not taking: Reported on 12/18/2015)  . isosorbide mononitrate (IMDUR) 120 MG 24 hr tablet Take 1 tablet (120 mg total) by mouth daily.  [name] levothyroxine (SYNTHROID, LEVOTHROID) 25 MCG tablet take 1 tablet by mouth every morning BEFORE BREAKFAST (Patient taking differently: Take 25 mcg by mouth every morning)  . loratadine (CLARITIN) 10 MG tablet Take 1 tablet (10 mg total) by mouth daily as needed for allergies.  . magnesium citrate SOLN Take 296 mLs (1 Bottle total) by mouth as needed for severe constipation.  . metoprolol succinate (TOPROL-XL) 50 MG 24 hr tablet Take 3 tablets (150 mg total) by mouth daily. Take with or immediately following a meal.  . Naphazoline HCl (CLEAR EYES OP) Place 1 drop into both eyes  as needed (for dry eyes).  . nitroGLYCERIN (NITROSTAT) 0.4 MG SL tablet Place 1 tablet (0.4 mg total) under the tongue every 5 (five) minutes as needed. For chest  . ranitidine (ZANTAC 75) 75 MG tablet Take 1 tablet (75 mg total) by mouth 2 (two) times daily. (Patient taking differently: Take 75 mg by mouth 2 (two) times daily as needed for heartburn. )  . senna-docusate (SENOKOT-S) 8.6-50 MG tablet Take 1 tablet by mouth at bedtime as needed for mild constipation.  . traZODone (DESYREL) 50 MG tablet Take 1 tablet (50 mg total) by mouth at bedtime. (Patient  taking differently: Take 50 mg by mouth at bedtime as needed for sleep. )  . triamcinolone ointment (KENALOG) 0.5 % Apply 1 application topically 2 (two) times daily. (Patient taking differently: Apply 1 application topically daily as needed (for skin). )   No facility-administered encounter medications on file as of 01/03/2016.     Functional Status:   In your present state of health, do you have any difficulty performing the following activities: 12/18/2015 12/18/2015  Hearing? - -  Vision? - -  Difficulty concentrating or making decisions? - -  Walking or climbing stairs? - -  Dressing or bathing? - -  Doing errands, shopping? N N  Some recent data might be hidden    Fall/Depression Screening:    PHQ 2/9 Scores 01/03/2016 11/30/2015 11/02/2015 09/28/2015 08/24/2015 02/23/2015 11/10/2014  PHQ - 2 Score '4 4 4 6 6 ' 0 5  PHQ- 9 Score '14 12 16 27 24 ' - 15   Fall Risk  01/03/2016 11/30/2015 11/02/2015 09/28/2015 08/24/2015  Falls in the past year? Yes Yes No No No  Number falls in past yr: 2 or more 2 or more - - -  Injury with Fall? Yes - - - -  Risk Factor Category  High Fall Risk High Fall Risk - - -  Risk for fall due to : History of fall(s);Impaired balance/gait;Impaired mobility;Impaired vision;Medication side effect - - - -  Follow up Follow up appointment - - - -   [facility] CM Care Plan Problem One   Flowsheet Row Most Recent Value  Care Plan Problem One  recent hospitalization for COPD exacerbation  Role Documenting the Problem One  Care Management Coordinator  Care Plan for Problem One  Active  [facility] Long Term Goal (31-90 days)  In the next 31 days, patient wil be able to verbalize the 3 Zones of the COPD Action Plan and verbalize associated symptom  [facility] Long Term Goal Start Date  01/03/16  Interventions for Problem One Long Term Goal  Initial home visit to assess her community care coordination needs.  [facility] CM Short Term Goal #1 (0-30 days)  In the next 21 days, patient will meet with [facility] RNCM  for COPD Education  [facility] CM Short Term Goal #1 Start Date  01/03/16  Interventions for Short Term Goal #1  RNCM met with patient for initial home visit.  Patient reports living in home that has mold and mildew in the walls which resulted in her last hospitalization    [facility] CM Care Plan Problem Two   Flowsheet Row Most Recent Value  Care Plan Problem Two  Patient reports living in home that has mold and mildew in the walls  Role Documenting the Problem Two  Care Management [place] for Problem Two  Active  [facility] CM Short Term Goal #1 (0-30 days)  In the next 14 days, patient will meet  with [facility] LCSW to review community resources for housing  [facility] CM Short Term Goal #1 Start Date  01/03/16  Interventions for Short Term Goal #2   During this initial home visit, patient states she would like to move to other housing due to airborne irritants that resulted in her being hospitalized earlier this month    [facility] CM Care Plan Problem Three   Flowsheet Row Most Recent Value  Care Plan Problem Three  (P) Patient reported continued grief for her son who died in 08/04/15.  Son was killed by a friend  Role Documenting the Problem Three  (P) Care Management [place] for Problem Three  (P) Active  [facility] CM Short Term Goal #1 (0-30 days)  (P) In the next 21 days, patient will meet with Grief Counself from [facility] CM Short Term Goal #1 Start Date  (P) 01/03/16  Interventions for Short Term Goal #1  (P) Patient expressed she grieves deeply for her son who lost his life after a friend shot him 6 times.     Assessment:   Patient has multiple chronic illnesses, needs community support to improve management  Plan:  Telephone contact within the next 21 days for community care coordination

## 2016-01-04 ENCOUNTER — Other Ambulatory Visit: Payer: Self-pay | Admitting: Licensed Clinical Social Worker

## 2016-01-04 NOTE — Patient Outreach (Signed)
[facility]) Care Management  [facility] Social Work  01/04/2016  Brooke Stafford 1956-04-02 [id number]   Encounter Medications:  Outpatient Encounter Prescriptions as of 01/04/2016  Medication Sig  . acetaminophen-codeine (TYLENOL #3) 300-30 MG tablet Take 1 tablet by mouth every 4 (four) hours as needed for moderate pain.  [name] albuterol (PROVENTIL HFA;VENTOLIN HFA) 108 (90 Base) MCG/ACT inhaler Inhale 2 puffs into the lungs every 6 (six) hours as needed for wheezing or shortness of breath.  . allopurinol (ZYLOPRIM) 300 MG tablet Take 1 tablet (300 mg total) by mouth daily.  [name] amLODipine (NORVASC) 10 MG tablet take 1 tablet by mouth once daily (Patient taking differently: Take 10 mg by mouth daily)  . aspirin 81 MG chewable tablet Chew 1 tablet (81 mg total) by mouth daily.  [name] atorvastatin (LIPITOR) 40 MG tablet Take 1 tablet (40 mg total) by mouth daily.  . clonazePAM (KLONOPIN) 0.5 MG tablet Take 1 tablet (0.5 mg total) by mouth 2 (two) times daily as needed for anxiety. (Patient taking differently: Take 0.25 mg by mouth 2 (two) times daily as needed for anxiety. )  . cloNIDine (CATAPRES) 0.3 MG tablet Take 1 tablet (0.3 mg total) by mouth every 6 (six) hours.  . clopidogrel (PLAVIX) 75 MG tablet Take 1 tablet (75 mg total) by mouth daily.  [name] ezetimibe (ZETIA) 10 MG tablet Take 1 tablet (10 mg total) by mouth daily.  . fluticasone (FLONASE) 50 MCG/ACT nasal spray Place 2 sprays into both nostrils daily as needed for allergies or rhinitis.  . furosemide (LASIX) 40 MG tablet Take 2 tablets (80 mg total) by mouth daily.  [name] gabapentin (NEURONTIN) 400 MG capsule take 1 capsule by mouth three times a day (Patient taking differently: Take 400 mg by mouth 3 times daily)  . insulin aspart (NOVOLOG FLEXPEN) 100 UNIT/ML FlexPen Inject 10-30 units into the skin 3 times daily, plus sliding scale as instructed.  . Insulin Glargine (TOUJEO SOLOSTAR) 300 UNIT/ML SOPN Inject 40 Units into the  skin at bedtime.  . Insulin Pen Needle 31G X 5 MM MISC Use to inject insulin 4 times daily as instructed. (Patient not taking: Reported on 12/18/2015)  . Insulin Syringes, Disposable, (B-D INSULIN SYRINGE 1CC) U-100 1 ML MISC Use to inject insulin 4 times daily as instructed. (Patient not taking: Reported on 12/18/2015)  . isosorbide mononitrate (IMDUR) 120 MG 24 hr tablet Take 1 tablet (120 mg total) by mouth daily.  [name] levothyroxine (SYNTHROID, LEVOTHROID) 25 MCG tablet take 1 tablet by mouth every morning BEFORE BREAKFAST (Patient taking differently: Take 25 mcg by mouth every morning)  . loratadine (CLARITIN) 10 MG tablet Take 1 tablet (10 mg total) by mouth daily as needed for allergies.  . magnesium citrate SOLN Take 296 mLs (1 Bottle total) by mouth as needed for severe constipation.  . metoprolol succinate (TOPROL-XL) 50 MG 24 hr tablet Take 3 tablets (150 mg total) by mouth daily. Take with or immediately following a meal.  . Naphazoline HCl (CLEAR EYES OP) Place 1 drop into both eyes as needed (for dry eyes).  . nitroGLYCERIN (NITROSTAT) 0.4 MG SL tablet Place 1 tablet (0.4 mg total) under the tongue every 5 (five) minutes as needed. For chest  . ranitidine (ZANTAC 75) 75 MG tablet Take 1 tablet (75 mg total) by mouth 2 (two) times daily. (Patient taking differently: Take 75 mg by mouth 2 (two) times daily as needed for heartburn. )  . senna-docusate (SENOKOT-S) 8.6-50 MG tablet  Take 1 tablet by mouth at bedtime as needed for mild constipation.  . traZODone (DESYREL) 50 MG tablet Take 1 tablet (50 mg total) by mouth at bedtime. (Patient taking differently: Take 50 mg by mouth at bedtime as needed for sleep. )  . triamcinolone ointment (KENALOG) 0.5 % Apply 1 application topically 2 (two) times daily. (Patient taking differently: Apply 1 application topically daily as needed (for skin). )   No facility-administered encounter medications on file as of 01/04/2016.     Functional Status:  In your  present state of health, do you have any difficulty performing the following activities: 12/18/2015 12/18/2015  Hearing? - -  Vision? - -  Difficulty concentrating or making decisions? - -  Walking or climbing stairs? - -  Dressing or bathing? - -  Doing errands, shopping? N N  Some recent data might be hidden    Fall/Depression Screening:  PHQ 2/9 Scores 01/04/2016 01/03/2016 11/30/2015 11/02/2015 09/28/2015 08/24/2015 02/23/2015  PHQ - 2 Score 3 4 4 4 6 6  0  PHQ- 9 Score 12 14 12 16 27 24  -    Assessment: CSW completed initial home visit on 01/04/16. Patient lives with a close friend in a home. CSW was introduced to patient's roommate who is patient's close friend. She states that there is mold and mildew in the walls but that it is now fixed. She reports "People came out and fixed it. They said it would take a few months for the smell to go away. I'm still bothered by it. I need to go."   Patient was provided a list of housing resources. CSW provided education on each resources. CSW provided [place] website for patient to use in order to start her search for a new residence. CSW also provided resources for [place] which can assist with home repairs. Patient does not wish to use this resource because she does not wish to stay in her residence. She reports that her roommate has started looking for another place to stay for them both. CSW informed patient that Section 8 wait list is currently closed. However, CSW provided other housing resources for patient to use if needed but most of these programs are for those struggling with homelessness.   CSW provided education on mental health resources as well. She reports that [facility] has made a referral for Hospice for grief counseling. Patient lost her son in April of 2017 of this year. She reports "Some days are harder than others but I feel I am mentally okay. I am just not physically doing okay." CSW provided mental health resources which  include grief support groups for the loss of a child. She is appreciative of this resource information.   Patient's roommate provide stable transportation to all of her appointments. However, patient wishes to gain another source of transportation. CSW provided patient with a list of transportation resources. Patient wishes to gain [employer] so that she can have transportation for both non medical and medical appointments. CSW completed SCAT application today and faxed it successfully on 01/04/16. CSW educated her on the SCAT application process.   Plan: CSW will route encounter to PCP. CSW left voice message for [facility] with updates. CSW will make next outreach call in two weeks to follow up on resources.  [name], BSW, [email or web address], [email or web address] Phone: [phone] Fax: [phone]

## 2016-01-08 DIAGNOSIS — E669 Obesity, unspecified: Secondary | ICD-10-CM | POA: Diagnosis not present

## 2016-01-08 DIAGNOSIS — N2581 Secondary hyperparathyroidism of renal origin: Secondary | ICD-10-CM | POA: Diagnosis not present

## 2016-01-08 DIAGNOSIS — E1129 Type 2 diabetes mellitus with other diabetic kidney complication: Secondary | ICD-10-CM | POA: Diagnosis not present

## 2016-01-08 DIAGNOSIS — I5033 Acute on chronic diastolic (congestive) heart failure: Secondary | ICD-10-CM | POA: Diagnosis not present

## 2016-01-08 DIAGNOSIS — I129 Hypertensive chronic kidney disease with stage 1 through stage 4 chronic kidney disease, or unspecified chronic kidney disease: Secondary | ICD-10-CM | POA: Diagnosis not present

## 2016-01-08 DIAGNOSIS — D631 Anemia in chronic kidney disease: Secondary | ICD-10-CM | POA: Diagnosis not present

## 2016-01-08 DIAGNOSIS — N184 Chronic kidney disease, stage 4 (severe): Secondary | ICD-10-CM | POA: Diagnosis not present

## 2016-01-10 ENCOUNTER — Other Ambulatory Visit: Payer: Self-pay | Admitting: Licensed Clinical Social Worker

## 2016-01-10 NOTE — Patient Outreach (Signed)
[place] [facility]) Care Management  01/10/2016  Brooke Stafford 1955-09-26 [id number]   Assessment- CSW completed call to SCAT and spoke with Katheren Puller. Katheren Puller stated that she received a 1 page fax from this [facility] CSW. However, Kennyth Lose was able to confirm that they did receive patient's entire SCAT application during the second attempt to fax document.  Plan-CSW will continue to provide social work assistance to aid in patient gaining stable transportation. CSW will make outreach to patient within by 01/18/16.   [name], BSW, MSW, [email or web address] Phone: [phone] Fax: [phone]

## 2016-01-11 ENCOUNTER — Encounter: Payer: Self-pay | Admitting: Internal Medicine

## 2016-01-11 ENCOUNTER — Ambulatory Visit: Payer: Medicare Other | Attending: Internal Medicine | Admitting: Internal Medicine

## 2016-01-11 VITALS — BP 167/93 | HR 59 | Temp 98.4°F | Resp 16 | Wt 237.4 lb

## 2016-01-11 DIAGNOSIS — Z79899 Other long term (current) drug therapy: Secondary | ICD-10-CM | POA: Insufficient documentation

## 2016-01-11 DIAGNOSIS — K219 Gastro-esophageal reflux disease without esophagitis: Secondary | ICD-10-CM | POA: Diagnosis not present

## 2016-01-11 DIAGNOSIS — E669 Obesity, unspecified: Secondary | ICD-10-CM | POA: Insufficient documentation

## 2016-01-11 DIAGNOSIS — I252 Old myocardial infarction: Secondary | ICD-10-CM | POA: Insufficient documentation

## 2016-01-11 DIAGNOSIS — Z880 Allergy status to penicillin: Secondary | ICD-10-CM | POA: Insufficient documentation

## 2016-01-11 DIAGNOSIS — Z7902 Long term (current) use of antithrombotics/antiplatelets: Secondary | ICD-10-CM | POA: Insufficient documentation

## 2016-01-11 DIAGNOSIS — E1140 Type 2 diabetes mellitus with diabetic neuropathy, unspecified: Secondary | ICD-10-CM | POA: Diagnosis not present

## 2016-01-11 DIAGNOSIS — I2510 Atherosclerotic heart disease of native coronary artery without angina pectoris: Secondary | ICD-10-CM | POA: Diagnosis not present

## 2016-01-11 DIAGNOSIS — I10 Essential (primary) hypertension: Secondary | ICD-10-CM | POA: Diagnosis not present

## 2016-01-11 DIAGNOSIS — N183 Chronic kidney disease, stage 3 (moderate): Secondary | ICD-10-CM | POA: Diagnosis not present

## 2016-01-11 DIAGNOSIS — F4322 Adjustment disorder with anxiety: Secondary | ICD-10-CM | POA: Insufficient documentation

## 2016-01-11 DIAGNOSIS — I200 Unstable angina: Secondary | ICD-10-CM

## 2016-01-11 DIAGNOSIS — Z951 Presence of aortocoronary bypass graft: Secondary | ICD-10-CM | POA: Diagnosis not present

## 2016-01-11 DIAGNOSIS — E039 Hypothyroidism, unspecified: Secondary | ICD-10-CM | POA: Diagnosis not present

## 2016-01-11 DIAGNOSIS — I130 Hypertensive heart and chronic kidney disease with heart failure and stage 1 through stage 4 chronic kidney disease, or unspecified chronic kidney disease: Secondary | ICD-10-CM | POA: Insufficient documentation

## 2016-01-11 DIAGNOSIS — Z794 Long term (current) use of insulin: Secondary | ICD-10-CM | POA: Insufficient documentation

## 2016-01-11 DIAGNOSIS — Z955 Presence of coronary angioplasty implant and graft: Secondary | ICD-10-CM | POA: Diagnosis not present

## 2016-01-11 DIAGNOSIS — Z7982 Long term (current) use of aspirin: Secondary | ICD-10-CM | POA: Diagnosis not present

## 2016-01-11 DIAGNOSIS — E1122 Type 2 diabetes mellitus with diabetic chronic kidney disease: Secondary | ICD-10-CM | POA: Insufficient documentation

## 2016-01-11 DIAGNOSIS — Z888 Allergy status to other drugs, medicaments and biological substances status: Secondary | ICD-10-CM | POA: Insufficient documentation

## 2016-01-11 DIAGNOSIS — J449 Chronic obstructive pulmonary disease, unspecified: Secondary | ICD-10-CM | POA: Insufficient documentation

## 2016-01-11 DIAGNOSIS — Z6833 Body mass index (BMI) 33.0-33.9, adult: Secondary | ICD-10-CM | POA: Diagnosis not present

## 2016-01-11 DIAGNOSIS — I5042 Chronic combined systolic (congestive) and diastolic (congestive) heart failure: Secondary | ICD-10-CM | POA: Insufficient documentation

## 2016-01-11 DIAGNOSIS — M109 Gout, unspecified: Secondary | ICD-10-CM | POA: Diagnosis not present

## 2016-01-11 DIAGNOSIS — Z23 Encounter for immunization: Secondary | ICD-10-CM

## 2016-01-11 DIAGNOSIS — E785 Hyperlipidemia, unspecified: Secondary | ICD-10-CM | POA: Diagnosis not present

## 2016-01-11 LAB — GLUCOSE, POCT (MANUAL RESULT ENTRY): POC Glucose: 131 mg/dl — AB (ref 70–99)

## 2016-01-11 MED ORDER — FUROSEMIDE 40 MG PO TABS: 80.0000 mg | ORAL_TABLET | Freq: Every day | ORAL | 3 refills | Status: DC

## 2016-01-11 MED ORDER — ACETAMINOPHEN-CODEINE #3 300-30 MG PO TABS: 1.0000 | ORAL_TABLET | ORAL | 0 refills | Status: DC | PRN

## 2016-01-11 MED ORDER — EZETIMIBE 10 MG PO TABS: 10.0000 mg | ORAL_TABLET | Freq: Every day | ORAL | 3 refills | Status: DC

## 2016-01-11 MED ORDER — CLONIDINE HCL 0.3 MG PO TABS: 0.3000 mg | ORAL_TABLET | Freq: Four times a day (QID) | ORAL | 3 refills | Status: DC

## 2016-01-11 MED ORDER — ISOSORBIDE MONONITRATE ER 120 MG PO TB24: 120.0000 mg | ORAL_TABLET | Freq: Every day | ORAL | 3 refills | Status: DC

## 2016-01-11 NOTE — Progress Notes (Signed)
Brooke Stafford, is a 60 y.o. female  ONG:[id number]  GMW:[id number]  DOB - 08-08-1955  Chief Complaint  Patient presents with  . Hospitalization Follow-up      Subjective:   Belenda Alviar is a 60 y.o. female with multiple medical history including advanced coronary artery disease with previous bypass surgery and stent placement, chronic systolic and diastolic heart failure with occasional acute exacerbation, insulin requiring type 2 diabetes mellitus complicated by chronic kidney disease stage III-IV, peripheral arterial disease, malignant hypertension, CVA in 10/2015 and Hyperlipidemia here today for a hospital discharge follow up visit and medication refill. Patient was recently admitted on 12/17/2015 for Acute on chronic diastolic CHF, EF 44-03% on echocardiogram in April 2017, 40 - 45% on 12/19/2015. She was diuresed with IV Lasix and she became stable at baseline, no shortness of breath and no oxygen requirement, she was continued on Imdur, continue beta blocker, salt and fluid restriction. Seen by both cardiology and renal, Lasix dose has been increased to 80 mg daily, she was discharged to follow with PCP and cardiology along with nephrology outpatient as needed. She has no new complaint today, she feels much better. She is adjusting better to her son's death. She denies any suicidal ideation or thoughts. She needs refill of some of her medications.   No problems updated.  ALLERGIES: Allergies  Allergen Reactions  . Digoxin And Related Other (See Comments)    Patient stated she almost died. Had flu like symptoms as well as diarrhea.  . Hydralazine Shortness Of Breath  . Lisinopril Other (See Comments)    Felt like she had the flu. Was very sick!!!  . Penicillins Cross Reactors Hives    And high fever  . Adhesive [Tape] Rash    bruising    PAST MEDICAL HISTORY: Past Medical History:  Diagnosis Date  . Anemia   . Anginal pain ([place])   . Anxiety   . Arthritis    "stiff  fingers and knees" (08/04/2013), (12/12/2014)  . Asthma   . CAD (coronary artery disease) 2002; 2015   CABG x 6 2002, cath 2011- med Rx stent DES VG-Diag  . CAD (coronary artery disease) of artery bypass graft; DES to VG-Diag 09/28/13 11/09/2013  . Cataract   . CHF (congestive heart failure) ([place])    "in 2002" (11/26/2012)  . Chronic bronchitis ([place])    "q year; in the winter"   . Chronic renal insufficiency, stage II (mild)    followed  by [facility]  . Coronary artery disease 2002   CABG x 6. Cath 5/11- med Rx  . GERD (gastroesophageal reflux disease)   . Gout    "right big toe"  . Headache    "~ q week" (08/04/2013); "~ twice/month" (12/12/2014)  . History of blood transfusion 2002   "when I had OHS"  . Hyperlipidemia   . Hypertension   . Hypothyroid    treated  . Migraines    "couple times/year" (08/04/2013), (12/12/2014)  . Myocardial infarction ([place]) 2000; 2002; 2011  . Obesity (BMI 35.0-39.9 without comorbidity) ([place])   . Peripheral vascular disease ([place]) 12/12   LSFA PTA  . Pneumonia    "3 times I think" (12/12/2014)  . Type II diabetes mellitus ([place])    MEDICATIONS AT HOME: Prior to Admission medications   Medication Sig Start Date End Date Taking? Authorizing Provider  acetaminophen-codeine (TYLENOL #3) 300-30 MG tablet Take 1 tablet by mouth every 4 (four) hours as needed for moderate pain. 01/11/16  Tresa Garter, MD  albuterol (PROVENTIL HFA;VENTOLIN HFA) 108 (90 Base) MCG/ACT inhaler Inhale 2 puffs into the lungs every 6 (six) hours as needed for wheezing or shortness of breath. 09/28/15   Tresa Garter, MD  allopurinol (ZYLOPRIM) 300 MG tablet Take 1 tablet (300 mg total) by mouth daily. 07/28/15   Ripudeep Krystal Eaton, MD  amLODipine (NORVASC) 10 MG tablet take 1 tablet by mouth once daily Patient taking differently: Take 10 mg by mouth daily 11/20/15   Sanda Klein, MD  aspirin 81 MG chewable tablet Chew 1 tablet (81 mg total) by mouth daily. 02/24/15    Mihai Croitoru, MD  atorvastatin (LIPITOR) 40 MG tablet Take 1 tablet (40 mg total) by mouth daily. 07/28/15   Ripudeep Krystal Eaton, MD  clonazePAM (KLONOPIN) 0.5 MG tablet Take 1 tablet (0.5 mg total) by mouth 2 (two) times daily as needed for anxiety. Patient taking differently: Take 0.25 mg by mouth 2 (two) times daily as needed for anxiety.  11/30/15 12/30/15  Tresa Garter, MD  cloNIDine (CATAPRES) 0.3 MG tablet Take 1 tablet (0.3 mg total) by mouth every 6 (six) hours. 01/11/16   Tresa Garter, MD  clopidogrel (PLAVIX) 75 MG tablet Take 1 tablet (75 mg total) by mouth daily. 09/25/15   Mihai Croitoru, MD  ezetimibe (ZETIA) 10 MG tablet Take 1 tablet (10 mg total) by mouth daily. 01/11/16   Tresa Garter, MD  fluticasone (FLONASE) 50 MCG/ACT nasal spray Place 2 sprays into both nostrils daily as needed for allergies or rhinitis. 09/28/15   Tresa Garter, MD  furosemide (LASIX) 40 MG tablet Take 2 tablets (80 mg total) by mouth daily. 01/11/16   Tresa Garter, MD  gabapentin (NEURONTIN) 400 MG capsule take 1 capsule by mouth three times a day Patient taking differently: Take 400 mg by mouth 3 times daily 02/16/15   Tresa Garter, MD  insulin aspart (NOVOLOG FLEXPEN) 100 UNIT/ML FlexPen Inject 10-30 units into the skin 3 times daily, plus sliding scale as instructed. 12/14/15   Philemon Kingdom, MD  Insulin Glargine (TOUJEO SOLOSTAR) 300 UNIT/ML SOPN Inject 40 Units into the skin at bedtime. 09/13/15   Philemon Kingdom, MD  Insulin Pen Needle 31G X 5 MM MISC Use to inject insulin 4 times daily as instructed. Patient not taking: Reported on 01/11/2016 02/16/15   Philemon Kingdom, MD  Insulin Syringes, Disposable, (B-D INSULIN SYRINGE 1CC) U-100 1 ML MISC Use to inject insulin 4 times daily as instructed. Patient not taking: Reported on 01/11/2016 02/22/14   Philemon Kingdom, MD  isosorbide mononitrate (IMDUR) 120 MG 24 hr tablet Take 1 tablet (120 mg total) by mouth daily. 01/11/16    Tresa Garter, MD  levothyroxine (SYNTHROID, LEVOTHROID) 25 MCG tablet take 1 tablet by mouth every morning BEFORE BREAKFAST Patient taking differently: Take 25 mcg by mouth every morning 11/24/15   Tresa Garter, MD  loratadine (CLARITIN) 10 MG tablet Take 1 tablet (10 mg total) by mouth daily as needed for allergies. 12/15/14   Erlene Quan, PA-C  magnesium citrate SOLN Take 296 mLs (1 Bottle total) by mouth as needed for severe constipation. 07/28/15   Ripudeep Krystal Eaton, MD  metoprolol succinate (TOPROL-XL) 50 MG 24 hr tablet Take 3 tablets (150 mg total) by mouth daily. Take with or immediately following a meal. 07/28/15   Ripudeep Krystal Eaton, MD  Naphazoline HCl (CLEAR EYES OP) Place 1 drop into both eyes as needed (for dry eyes).  Historical Provider, MD  nitroGLYCERIN (NITROSTAT) 0.4 MG SL tablet Place 1 tablet (0.4 mg total) under the tongue every 5 (five) minutes as needed. For chest 11/10/14   Tresa Garter, MD  ranitidine (ZANTAC 75) 75 MG tablet Take 1 tablet (75 mg total) by mouth 2 (two) times daily. Patient taking differently: Take 75 mg by mouth 2 (two) times daily as needed for heartburn.  07/28/15   Ripudeep Krystal Eaton, MD  senna-docusate (SENOKOT-S) 8.6-50 MG tablet Take 1 tablet by mouth at bedtime as needed for mild constipation. 12/23/15   Virgel Manifold, MD  traZODone (DESYREL) 50 MG tablet Take 1 tablet (50 mg total) by mouth at bedtime. Patient taking differently: Take 50 mg by mouth at bedtime as needed for sleep.  09/28/15   Tresa Garter, MD  triamcinolone ointment (KENALOG) 0.5 % Apply 1 application topically 2 (two) times daily. Patient taking differently: Apply 1 application topically daily as needed (for skin).  08/24/15   Tresa Garter, MD     Objective:   Vitals:   01/11/16 0916  BP: (!) 167/93  Pulse: (!) 59  Resp: 16  Temp: 98.4 F (36.9 C)  TempSrc: Oral  SpO2: 97%  Weight: 237 lb 6.4 oz (107.7 kg)    Exam  General appearance : Awake,  alert, not in any distress. Speech Clear. Not toxic looking, obese HEENT: Atraumatic and Normocephalic, pupils equally reactive to light and accomodation Neck: Supple, no JVD. No cervical lymphadenopathy.  Chest: Surgical scar midlineGood air entry bilaterally, no added sounds  CVS: S1 S2 regular, no murmurs.  Abdomen: Bowel sounds present, Non tender and not distended with no gaurding, rigidity or rebound. Extremities: B/L Lower Ext shows no edema, both legs are warm to touch Neurology: Awake alert, and oriented X 3, CN II-XII intact, Non focal Skin: No Rash  Data Review Lab Results  Component Value Date   HGBA1C 6.2 11/02/2015   HGBA1C 7.2 (H) 07/25/2015   HGBA1C 6.40 02/23/2015     Assessment & Plan   1. Type 2 diabetes mellitus with stage 3 chronic kidney disease, with long-term current use of insulin (HCC)  - Tdap vaccine greater than or equal to 7yo IM - Glucose (CBG)  Aim for 30 minutes of exercise most days. Rethink what you drink. Water is great! Aim for 2-3 Carb Choices per meal (30-45 grams) +/- 1 either way  Aim for 0-15 Carbs per snack if hungry  Include protein in moderation with your meals and snacks  Consider reading food labels for Total Carbohydrate and Fat Grams of foods  Consider checking BG at alternate times per day  Continue taking medication as directed Be mindful about how much sugar you are adding to beverages and other foods. Fruit Punch - find one with no sugar  Measure and decrease portions of carbohydrate foods  Make your plate and don't go back for seconds  2. Adjustment disorder with anxious mood  - Improving - Continue current treatment  3. Essential hypertension, benign Refill - isosorbide mononitrate (IMDUR) 120 MG 24 hr tablet; Take 1 tablet (120 mg total) by mouth daily.  Dispense: 90 tablet; Refill: 3 - cloNIDine (CATAPRES) 0.3 MG tablet; Take 1 tablet (0.3 mg total) by mouth every 6 (six) hours.  Dispense: 120 tablet; Refill:  3  We have discussed target BP range and blood pressure goal. I have advised patient to check BP regularly and to call us back or report to clinic if the numbers are  consistently higher than 140/90. We discussed the importance of compliance with medical therapy and DASH diet recommended, consequences of uncontrolled hypertension discussed.  - continue current BP medications  4. Type 2 diabetes mellitus with diabetic neuropathy, with long-term current use of insulin (HCC)  - acetaminophen-codeine (TYLENOL #3) 300-30 MG tablet; Take 1 tablet by mouth every 4 (four) hours as needed for moderate pain.  Dispense: 60 tablet; Refill: 0  5. Chronic combined systolic and diastolic congestive heart failure (HCC) Refill - furosemide (LASIX) 40 MG tablet; Take 2 tablets (80 mg total) by mouth daily.  Dispense: 180 tablet; Refill: 3 - ezetimibe (ZETIA) 10 MG tablet; Take 1 tablet (10 mg total) by mouth daily.  Dispense: 90 tablet; Refill: 3 - isosorbide mononitrate (IMDUR) 120 MG 24 hr tablet; Take 1 tablet (120 mg total) by mouth daily.  Dispense: 90 tablet; Refill: 3  Patient have been counseled extensively about nutrition and exercise  Return in about 3 months (around 04/11/2016) for Hemoglobin A1C and Follow up, DM, Heart Failure and Hypertension, Follow up Pain and comorbidities.  The patient was given clear instructions to go to ER or return to medical center if symptoms don't improve, worsen or new problems develop. The patient verbalized understanding. The patient was told to call to get lab results if they haven't heard anything in the next week.   This note has been created with Surveyor, quantity. Any transcriptional errors are unintentional.    Angelica Chessman, MD, [place], Karilyn Cota, [place] and [facility] [place], [place]   01/11/2016, 9:54 AM

## 2016-01-11 NOTE — Patient Instructions (Signed)
Tdap Vaccine (Tetanus, Diphtheria and Pertussis): What You Need to Know 1. Why get vaccinated? Tetanus, diphtheria and pertussis are very serious diseases. Tdap vaccine can protect us from these diseases. And, Tdap vaccine given to pregnant women can protect newborn babies against pertussis. TETANUS (Lockjaw) is rare in the [place] today. It causes painful muscle tightening and stiffness, usually all over the body.  It can lead to tightening of muscles in the head and neck so you can't open your mouth, swallow, or sometimes even breathe. Tetanus kills about 1 out of 10 people who are infected even after receiving the best medical care. DIPHTHERIA is also rare in the [place] today. It can cause a thick coating to form in the back of the throat.  It can lead to breathing problems, heart failure, paralysis, and death. PERTUSSIS (Whooping Cough) causes severe coughing spells, which can cause difficulty breathing, vomiting and disturbed sleep.  It can also lead to weight loss, incontinence, and rib fractures. Up to 2 in 100 adolescents and 5 in 100 adults with pertussis are hospitalized or have complications, which could include pneumonia or death. These diseases are caused by bacteria. Diphtheria and pertussis are spread from person to person through secretions from coughing or sneezing. Tetanus enters the body through cuts, scratches, or wounds. Before vaccines, as many as 200,000 cases of diphtheria, 200,000 cases of pertussis, and hundreds of cases of tetanus, were reported in the [place] each year. Since vaccination began, reports of cases for tetanus and diphtheria have dropped by about 99% and for pertussis by about 80%. 2. Tdap vaccine Tdap vaccine can protect adolescents and adults from tetanus, diphtheria, and pertussis. One dose of Tdap is routinely given at age 11 or 12. People who did not get Tdap at that age should get it as soon as possible. Tdap is especially important  for healthcare professionals and anyone having close contact with a baby younger than 12 months. Pregnant women should get a dose of Tdap during every pregnancy, to protect the newborn from pertussis. Infants are most at risk for severe, life-threatening complications from pertussis. Another vaccine, called Td, protects against tetanus and diphtheria, but not pertussis. A Td booster should be given every 10 years. Tdap may be given as one of these boosters if you have never gotten Tdap before. Tdap may also be given after a severe cut or burn to prevent tetanus infection. Your doctor or the person giving you the vaccine can give you more information. Tdap may safely be given at the same time as other vaccines. 3. Some people should not get this vaccine  A person who has ever had a life-threatening allergic reaction after a previous dose of any diphtheria, tetanus or pertussis containing vaccine, OR has a severe allergy to any part of this vaccine, should not get Tdap vaccine. Tell the person giving the vaccine about any severe allergies.  Anyone who had coma or long repeated seizures within 7 days after a childhood dose of DTP or DTaP, or a previous dose of Tdap, should not get Tdap, unless a cause other than the vaccine was found. They can still get Td.  Talk to your doctor if you:  have seizures or another nervous system problem,  had severe pain or swelling after any vaccine containing diphtheria, tetanus or pertussis,  ever had a condition called Guillain-Barr Syndrome (GBS),  aren't feeling well on the day the shot is scheduled. 4. Risks With any medicine, including vaccines, there is   a chance of side effects. These are usually mild and go away on their own. Serious reactions are also possible but are rare. Most people who get Tdap vaccine do not have any problems with it. Mild problems following Tdap (Did not interfere with activities)  Pain where the shot was given (about 3 in 4  adolescents or 2 in 3 adults)  Redness or swelling where the shot was given (about 1 person in 5)  Mild fever of at least 100.4F (up to about 1 in 25 adolescents or 1 in 100 adults)  Headache (about 3 or 4 people in 10)  Tiredness (about 1 person in 3 or 4)  Nausea, vomiting, diarrhea, stomach ache (up to 1 in 4 adolescents or 1 in 10 adults)  Chills, sore joints (about 1 person in 10)  Body aches (about 1 person in 3 or 4)  Rash, swollen glands (uncommon) Moderate problems following Tdap (Interfered with activities, but did not require medical attention)  Pain where the shot was given (up to 1 in 5 or 6)  Redness or swelling where the shot was given (up to about 1 in 16 adolescents or 1 in 12 adults)  Fever over 102F (about 1 in 100 adolescents or 1 in 250 adults)  Headache (about 1 in 7 adolescents or 1 in 10 adults)  Nausea, vomiting, diarrhea, stomach ache (up to 1 or 3 people in 100)  Swelling of the entire arm where the shot was given (up to about 1 in 500). Severe problems following Tdap (Unable to perform usual activities; required medical attention)  Swelling, severe pain, bleeding and redness in the arm where the shot was given (rare). Problems that could happen after any vaccine:  People sometimes faint after a medical procedure, including vaccination. Sitting or lying down for about 15 minutes can help prevent fainting, and injuries caused by a fall. Tell your doctor if you feel dizzy, or have vision changes or ringing in the ears.  Some people get severe pain in the shoulder and have difficulty moving the arm where a shot was given. This happens very rarely.  Any medication can cause a severe allergic reaction. Such reactions from a vaccine are very rare, estimated at fewer than 1 in a million doses, and would happen within a few minutes to a few hours after the vaccination. As with any medicine, there is a very remote chance of a vaccine causing a serious  injury or death. The safety of vaccines is always being monitored. For more information, visit: www.cdc.gov/vaccinesafety/ 5. What if there is a serious problem? What should I look for?  Look for anything that concerns you, such as signs of a severe allergic reaction, very high fever, or unusual behavior.  Signs of a severe allergic reaction can include hives, swelling of the face and throat, difficulty breathing, a fast heartbeat, dizziness, and weakness. These would usually start a few minutes to a few hours after the vaccination. What should I do?  If you think it is a severe allergic reaction or other emergency that can't wait, call 9-1-1 or get the person to the nearest hospital. Otherwise, call your doctor.  Afterward, the reaction should be reported to the Vaccine Adverse Event Reporting System (VAERS). Your doctor might file this report, or you can do it yourself through the VAERS web site at www.vaers.hhs.gov, or by calling 1-800-822-7967. VAERS does not give medical advice.  6. The National Vaccine Injury Compensation Program The National Vaccine Injury Compensation Program (  VICP) is a federal program that was created to compensate people who may have been injured by certain vaccines. Persons who believe they may have been injured by a vaccine can learn about the program and about filing a claim by calling 1-800-338-2382 or visiting the VICP website at www.hrsa.gov/vaccinecompensation. There is a time limit to file a claim for compensation. 7. How can I learn more?  Ask your doctor. He or she can give you the vaccine package insert or suggest other sources of information.  Call your local or state health department.  Contact the Centers for Disease Control and Prevention (CDC):  Call 1-800-232-4636 (1-800-CDC-INFO) or  Visit CDC's website at www.cdc.gov/vaccines CDC Tdap Vaccine VIS (06/08/13)   This information is not intended to replace advice given to you by your health care  provider. Make sure you discuss any questions you have with your health care provider.   Document Released: 10/01/2011 Document Revised: 04/22/2014 Document Reviewed: 07/14/2013 Elsevier Interactive Patient Education 2016 Elsevier Inc.  

## 2016-01-11 NOTE — Progress Notes (Signed)
Pt is in the office today for hospitalization follow up Pt states she is not in any pain Pt states she is taking medication without difficulty

## 2016-01-15 ENCOUNTER — Ambulatory Visit: Payer: Medicare Other | Admitting: Neurology

## 2016-01-15 DIAGNOSIS — E113412 Type 2 diabetes mellitus with severe nonproliferative diabetic retinopathy with macular edema, left eye: Secondary | ICD-10-CM | POA: Diagnosis not present

## 2016-01-17 ENCOUNTER — Telehealth: Payer: Self-pay | Admitting: *Deleted

## 2016-01-17 NOTE — Telephone Encounter (Signed)
Labs received from [place] (collected on 01/08/16:  RENAL FUNCTION PANEL:  BUN: 31 Creatinine, Serum: 2.94 eGFR, If Non-African Am: 17 eGFR, If African Am: 19 BUN/Creatinine Ratio: 11

## 2016-01-18 ENCOUNTER — Other Ambulatory Visit: Payer: Self-pay | Admitting: Licensed Clinical Social Worker

## 2016-01-18 NOTE — Patient Outreach (Signed)
[facility]) Care Management  01/18/2016  CASAUNDRA TAKACS Apr 17, 1955 [id number]   Assessment-CSW completed outreach attempt today. CSW unable to reach patient successfully. CSW left a HIPPA compliant voice message encouraging patient to return call once available.  Plan-CSW will await return call or complete an additional outreach if needed within two weeks.  [name], BSW, MSW, [email or web address] Phone: [phone] Fax: [phone]

## 2016-01-22 ENCOUNTER — Other Ambulatory Visit: Payer: Self-pay

## 2016-01-22 ENCOUNTER — Other Ambulatory Visit (HOSPITAL_COMMUNITY): Payer: Self-pay | Admitting: *Deleted

## 2016-01-22 NOTE — Patient Outreach (Signed)
      Unsuccessful attempt made to contact patient via telephone for community care coordination. HIPPA compliant message left with this RNCM's contact information.  Plan: Make another attempt to contact patient via telephone later this month

## 2016-01-23 ENCOUNTER — Encounter (HOSPITAL_COMMUNITY)
Admission: RE | Admit: 2016-01-23 | Discharge: 2016-01-23 | Disposition: A | Discharge status: Home / Self Care | Payer: Medicare Other | Source: Ambulatory Visit | Attending: Nephrology | Admitting: Nephrology

## 2016-01-23 ENCOUNTER — Other Ambulatory Visit: Payer: Self-pay | Admitting: Licensed Clinical Social Worker

## 2016-01-23 DIAGNOSIS — D631 Anemia in chronic kidney disease: Secondary | ICD-10-CM | POA: Diagnosis not present

## 2016-01-23 DIAGNOSIS — Z5181 Encounter for therapeutic drug level monitoring: Secondary | ICD-10-CM | POA: Insufficient documentation

## 2016-01-23 DIAGNOSIS — Z79899 Other long term (current) drug therapy: Secondary | ICD-10-CM | POA: Diagnosis not present

## 2016-01-23 DIAGNOSIS — N184 Chronic kidney disease, stage 4 (severe): Secondary | ICD-10-CM | POA: Insufficient documentation

## 2016-01-23 DIAGNOSIS — N183 Chronic kidney disease, stage 3 unspecified: Secondary | ICD-10-CM

## 2016-01-23 LAB — FERRITIN: Ferritin: 100 ng/mL (ref 11–307)

## 2016-01-23 LAB — IRON AND TIBC
Iron: 70 ug/dL (ref 28–170)
SATURATION RATIOS: 25 % (ref 10.4–31.8)
TIBC: 281 ug/dL (ref 250–450)
UIBC: 211 ug/dL

## 2016-01-23 LAB — POCT HEMOGLOBIN-HEMACUE: Hemoglobin: 11 g/dL — ABNORMAL LOW (ref 12.0–15.0)

## 2016-01-23 MED ORDER — DARBEPOETIN ALFA 100 MCG/0.5ML IJ SOSY
100.0000 ug | PREFILLED_SYRINGE | INTRAMUSCULAR | Status: DC
Start: 2016-01-23 — End: 2016-01-24
  Administered 2016-01-23: 100 ug via SUBCUTANEOUS

## 2016-01-23 MED ORDER — CLONIDINE HCL 0.1 MG PO TABS: 0.1000 mg | ORAL_TABLET | Freq: Once | ORAL | Status: DC | PRN

## 2016-01-23 MED ORDER — DARBEPOETIN ALFA 100 MCG/0.5ML IJ SOSY
PREFILLED_SYRINGE | INTRAMUSCULAR | Status: AC
  Administered 2016-01-23: 100 ug via SUBCUTANEOUS
  Filled 2016-01-23: qty 0.5

## 2016-01-23 NOTE — Patient Outreach (Signed)
[place] [facility]) Care Management  01/23/2016  Brooke Stafford 20-Nov-1955 [id number]   Assessment-CSW completed second outreach attempt today. CSW unable to reach patient successfully. CSW left a HIPPA compliant voice message encouraging patient to return call once available.  Plan-CSW will await return call or complete third outreach within two weeks if needed.  Brooke Stafford, BSW, MSW, [email or web address] Phone: [phone] Fax: [phone]

## 2016-01-24 ENCOUNTER — Encounter: Payer: Self-pay | Admitting: Licensed Clinical Social Worker

## 2016-01-24 ENCOUNTER — Other Ambulatory Visit: Payer: Self-pay | Admitting: Internal Medicine

## 2016-01-24 ENCOUNTER — Other Ambulatory Visit: Payer: Self-pay | Admitting: Licensed Clinical Social Worker

## 2016-01-24 NOTE — Patient Outreach (Signed)
[place]) Care Management  01/24/2016  Brooke Stafford 1955-05-09 [id number]   Assessment- CSW received incoming call from patient. Patient reports that she is doing well. She shares that she has used the housing website that Carl provided and is going to look at a few apartments tomorrow. She reports that she has spent time reviewing housing resources that CSW provided at last home visit and feels it will be best to relocate. Patient states that the apartments she found are in their budget and in good locations. Patient appreciative of housing resource information.   CSW questioned if she had heard from [employer]. Patient reports that she went to her [facility] assessment appointment yesterday and was approved for services stating "I got my ID badge too." CSW spent time educating patient on how to schedule transportation arrangements with [employer], their reservation line and their cost each way.   Patient has no further social work needs at this time and is agreeable to social work discharge. [facility] RNCM will remain involved.  Plan-CSW will send update to PCP and [facility] RNCM. CSW will complete discharge.  [facility] CM Care Plan Problem One   Flowsheet Row Most Recent Value  Care Plan Problem One  Lack of community resource support  Role Documenting the Problem One  Clinical Social Worker  Care Plan for Problem One  Active  [facility] Long Term Goal (31-90 days)  Patient will be able to attend all medical appointments by 90 days by gaining stable transportation as evidenced by her lack of transportation.  [facility] Long Term Goal Start Date  01/03/16  [facility] Long Term Goal Met Date  01/24/16  Interventions for Problem One Long Term Goal  Patient now has [facility] and can attend all medical appointments.   [facility] CM Short Term Goal #1 (0-30 days)  Patient will gain housing resources within 30 days as evidenced by having mold in her home which led to health concerns  [facility] CM Short Term Goal #1 Start Date  01/03/16  [facility] CM  Short Term Goal #1 Met Date  01/24/16  Interventions for Short Term Goal #1  Housing resource handouts provided and education provided on various occasions.       [name], BSW, MSW, [email or web address] Phone: [phone] Fax: [phone]

## 2016-01-25 DIAGNOSIS — E113412 Type 2 diabetes mellitus with severe nonproliferative diabetic retinopathy with macular edema, left eye: Secondary | ICD-10-CM | POA: Diagnosis not present

## 2016-01-25 DIAGNOSIS — E113411 Type 2 diabetes mellitus with severe nonproliferative diabetic retinopathy with macular edema, right eye: Secondary | ICD-10-CM | POA: Diagnosis not present

## 2016-01-26 ENCOUNTER — Other Ambulatory Visit (HOSPITAL_COMMUNITY): Payer: Medicare Other

## 2016-01-26 ENCOUNTER — Encounter (HOSPITAL_COMMUNITY): Payer: Medicare Other

## 2016-01-26 ENCOUNTER — Ambulatory Visit: Payer: Medicare Other | Admitting: Vascular Surgery

## 2016-01-29 ENCOUNTER — Ambulatory Visit: Payer: Medicare Other

## 2016-02-01 ENCOUNTER — Other Ambulatory Visit: Payer: Self-pay

## 2016-02-01 NOTE — Patient Outreach (Signed)
[facility]) Care Management   02/01/2016  Brooke Stafford [birth year] [id number]  Brooke Stafford is an 60 y.o. female  Subjective:  I am still trying to find me somewhere to live.   My friend got something to put on the vents in the house made with charcoal, suppose to help with the mold smell.  Objective:   ROS  Physical Exam  Encounter Medications:   Outpatient Encounter Prescriptions as of 02/01/2016  Medication Sig  . acetaminophen-codeine (TYLENOL #3) 300-30 MG tablet Take 1 tablet by mouth every 4 (four) hours as needed for moderate pain.  [name] albuterol (PROVENTIL HFA;VENTOLIN HFA) 108 (90 Base) MCG/ACT inhaler Inhale 2 puffs into the lungs every 6 (six) hours as needed for wheezing or shortness of breath.  . allopurinol (ZYLOPRIM) 300 MG tablet Take 1 tablet (300 mg total) by mouth daily.  [name] amLODipine (NORVASC) 10 MG tablet take 1 tablet by mouth once daily (Patient taking differently: Take 10 mg by mouth daily)  . aspirin 81 MG chewable tablet Chew 1 tablet (81 mg total) by mouth daily.  [name] atorvastatin (LIPITOR) 40 MG tablet Take 1 tablet (40 mg total) by mouth daily.  . clonazePAM (KLONOPIN) 0.5 MG tablet Take 1 tablet (0.5 mg total) by mouth 2 (two) times daily as needed for anxiety. (Patient taking differently: Take 0.25 mg by mouth 2 (two) times daily as needed for anxiety. )  . cloNIDine (CATAPRES) 0.3 MG tablet Take 1 tablet (0.3 mg total) by mouth every 6 (six) hours.  . clopidogrel (PLAVIX) 75 MG tablet Take 1 tablet (75 mg total) by mouth daily.  [name] ezetimibe (ZETIA) 10 MG tablet Take 1 tablet (10 mg total) by mouth daily.  . fluticasone (FLONASE) 50 MCG/ACT nasal spray Place 2 sprays into both nostrils daily as needed for allergies or rhinitis.  . furosemide (LASIX) 40 MG tablet Take 2 tablets (80 mg total) by mouth daily.  [name] gabapentin (NEURONTIN) 400 MG capsule take 1 capsule by mouth three times a day  . insulin aspart (NOVOLOG FLEXPEN) 100  UNIT/ML FlexPen Inject 10-30 units into the skin 3 times daily, plus sliding scale as instructed.  . Insulin Glargine (TOUJEO SOLOSTAR) 300 UNIT/ML SOPN Inject 40 Units into the skin at bedtime.  . Insulin Pen Needle 31G X 5 MM MISC Use to inject insulin 4 times daily as instructed. (Patient not taking: Reported on 01/11/2016)  . Insulin Syringes, Disposable, (B-D INSULIN SYRINGE 1CC) U-100 1 ML MISC Use to inject insulin 4 times daily as instructed. (Patient not taking: Reported on 01/11/2016)  . isosorbide mononitrate (IMDUR) 120 MG 24 hr tablet Take 1 tablet (120 mg total) by mouth daily.  [name] levothyroxine (SYNTHROID, LEVOTHROID) 25 MCG tablet take 1 tablet by mouth every morning BEFORE BREAKFAST (Patient taking differently: Take 25 mcg by mouth every morning)  . loratadine (CLARITIN) 10 MG tablet Take 1 tablet (10 mg total) by mouth daily as needed for allergies.  . magnesium citrate SOLN Take 296 mLs (1 Bottle total) by mouth as needed for severe constipation.  . metoprolol succinate (TOPROL-XL) 50 MG 24 hr tablet Take 3 tablets (150 mg total) by mouth daily. Take with or immediately following a meal.  . Naphazoline HCl (CLEAR EYES OP) Place 1 drop into both eyes as needed (for dry eyes).  . nitroGLYCERIN (NITROSTAT) 0.4 MG SL tablet Place 1 tablet (0.4 mg total) under the tongue every 5 (five) minutes as needed. For chest  . ranitidine (ZANTAC  75) 75 MG tablet Take 1 tablet (75 mg total) by mouth 2 (two) times daily. (Patient taking differently: Take 75 mg by mouth 2 (two) times daily as needed for heartburn. )  . senna-docusate (SENOKOT-S) 8.6-50 MG tablet Take 1 tablet by mouth at bedtime as needed for mild constipation.  . traZODone (DESYREL) 50 MG tablet Take 1 tablet (50 mg total) by mouth at bedtime. (Patient taking differently: Take 50 mg by mouth at bedtime as needed for sleep. )  . triamcinolone ointment (KENALOG) 0.5 % Apply 1 application topically 2 (two) times daily. (Patient taking  differently: Apply 1 application topically daily as needed (for skin). )   No facility-administered encounter medications on file as of 02/01/2016.     Functional Status:   In your present state of health, do you have any difficulty performing the following activities: 01/23/2016 12/18/2015  Hearing? N -  Vision? N -  Difficulty concentrating or making decisions? N -  Walking or climbing stairs? N -  Dressing or bathing? N -  Doing errands, shopping? - N  Some recent data might be hidden    Fall/Depression Screening:    PHQ 2/9 Scores 01/11/2016 01/04/2016 01/03/2016 11/30/2015 11/02/2015 09/28/2015 08/24/2015  PHQ - 2 Score '1 3 4 4 4 6 6  ' PHQ- 9 Score - '12 14 12 16 27 24   ' [facility] CM Care Plan Problem One   Flowsheet Row Most Recent Value  Care Plan Problem One  recent hospitalization for COPD exacerbation  Role Documenting the Problem One  Care Management [place] for Problem One  Active  [facility]N Long Term Goal (31-90 days)  In the next 31 days, patient wil be able to verbalize the 3 Zones of the COPD Action Plan and verbalize associated symptom  [facility] Long Term Goal Start Date  01/03/16  [facility] Long Term Goal Met Date  01/24/16  Interventions for Problem One Long Term Goal  Initial home visit to assess her community care coordination needs.  [facility] CM Short Term Goal #1 (0-30 days)  In the next 21 days, patient will meet with [facility] RNCM for COPD Education  [facility] CM Short Term Goal #1 Start Date  01/03/16  [facility] CM Short Term Goal #1 Met Date  01/24/16  Interventions for Short Term Goal #1  RNCM met with patient for initial home visit.  Patient reports living in home that has mold and mildew in the walls which resulted in her last hospitalization    [facility] CM Care Plan Problem Two   Flowsheet Row Most Recent Value  Care Plan Problem Two  Patient reports living in home that has mold and mildew in the walls  Role Documenting the Problem Two  Care Management [facility] for Problem Two   Active  [facility] CM Short Term Goal #1 (0-30 days)  In the next 14 days, patient will meet with [facility] LCSW to review community resources for housing  [facility] CM Short Term Goal #1 Start Date  01/03/16  [facility] CM Short Term Goal #1 Met Date   02/01/16  Interventions for Short Term Goal #2   [place] home visit, patient advised this RNCM she met with [facility] LCSW for housing information, support.    [facility] CM Care Plan Problem Three   Flowsheet Row Most Recent Value  Care Plan Problem Three  Patient reported continued grief for her son who died in 07-22-15.  Son was killed by a friend  Role Documenting the  Problem Three  Care Management Coordinator  Care Plan for Problem Three  Active  [facility] CM Short Term Goal #1 (0-30 days)  In the next 21 days, patient will meet with Grief Counself from [facility] CM Short Term Goal #1 Start Date  01/03/16  [facility] CM Short Term Goal #1 Met Date  02/01/16  Interventions for Short Term Goal #1  Home visit patient advised this RNCM she has spoken with Hospice Grief Counselor regarding grief surrounding her son.        Assessment:   Patient continues to look for affordable housing, intends to continue living at her current location until she finds something. Patient states her landlord and boyfriend have made some changes which has made the house liveable, decreasing the moldy smell. Patient reports using fans to circulate clean smelling air.   Patient was given information for affordable housing by the [facility] LCSW, provided other resources. This RNCM gave patient information on affordable housing at [employer], [employer] and [facility].    Plan:  Discharge from caseload as patient has met her case management goals.

## 2016-02-07 DIAGNOSIS — I5033 Acute on chronic diastolic (congestive) heart failure: Secondary | ICD-10-CM | POA: Diagnosis not present

## 2016-02-07 DIAGNOSIS — E1129 Type 2 diabetes mellitus with other diabetic kidney complication: Secondary | ICD-10-CM | POA: Diagnosis not present

## 2016-02-07 DIAGNOSIS — E669 Obesity, unspecified: Secondary | ICD-10-CM | POA: Diagnosis not present

## 2016-02-07 DIAGNOSIS — I639 Cerebral infarction, unspecified: Secondary | ICD-10-CM | POA: Diagnosis not present

## 2016-02-07 DIAGNOSIS — N184 Chronic kidney disease, stage 4 (severe): Secondary | ICD-10-CM | POA: Diagnosis not present

## 2016-02-07 DIAGNOSIS — D631 Anemia in chronic kidney disease: Secondary | ICD-10-CM | POA: Diagnosis not present

## 2016-02-07 DIAGNOSIS — I129 Hypertensive chronic kidney disease with stage 1 through stage 4 chronic kidney disease, or unspecified chronic kidney disease: Secondary | ICD-10-CM | POA: Diagnosis not present

## 2016-02-07 DIAGNOSIS — N2581 Secondary hyperparathyroidism of renal origin: Secondary | ICD-10-CM | POA: Diagnosis not present

## 2016-02-12 ENCOUNTER — Telehealth: Payer: Self-pay | Admitting: *Deleted

## 2016-02-12 NOTE — Telephone Encounter (Signed)
Labs received from [place] (collected on 02/07/16):  RENAL FUNCTION PANEL: Glucose - 149 BUN - 44 Creatinie - 3.43 eGFR, If NonAfricn Am - 14 eGFR, If Africn Am - 16 Potassium - 5.5 (all other values wnl)

## 2016-02-14 ENCOUNTER — Ambulatory Visit (INDEPENDENT_AMBULATORY_CARE_PROVIDER_SITE_OTHER): Payer: Medicare Other | Admitting: Neurology

## 2016-02-14 ENCOUNTER — Encounter: Payer: Self-pay | Admitting: Neurology

## 2016-02-14 VITALS — BP 140/82 | HR 71 | Ht 70.5 in | Wt 241.0 lb

## 2016-02-14 DIAGNOSIS — I200 Unstable angina: Secondary | ICD-10-CM | POA: Diagnosis not present

## 2016-02-14 DIAGNOSIS — E785 Hyperlipidemia, unspecified: Secondary | ICD-10-CM | POA: Diagnosis not present

## 2016-02-14 DIAGNOSIS — I679 Cerebrovascular disease, unspecified: Secondary | ICD-10-CM

## 2016-02-14 DIAGNOSIS — R42 Dizziness and giddiness: Secondary | ICD-10-CM

## 2016-02-14 DIAGNOSIS — F329 Major depressive disorder, single episode, unspecified: Secondary | ICD-10-CM

## 2016-02-14 DIAGNOSIS — Z951 Presence of aortocoronary bypass graft: Secondary | ICD-10-CM | POA: Diagnosis not present

## 2016-02-14 DIAGNOSIS — N183 Chronic kidney disease, stage 3 unspecified: Secondary | ICD-10-CM

## 2016-02-14 DIAGNOSIS — E1140 Type 2 diabetes mellitus with diabetic neuropathy, unspecified: Secondary | ICD-10-CM

## 2016-02-14 DIAGNOSIS — Z794 Long term (current) use of insulin: Secondary | ICD-10-CM

## 2016-02-14 NOTE — Patient Instructions (Signed)
I don't know for sure if you really had a mini stroke.  It could have been related to dehydration and anxiety.  But you are already taking medication that helps prevent stroke anyway.  Follow up as needed.

## 2016-02-14 NOTE — Progress Notes (Signed)
NEUROLOGY CONSULTATION NOTE  Brooke Stafford MRN: [id number] DOB: Apr 26, 1955  Referring provider: Larence Penning ED referral Primary care provider: Dr. Doreene Burke  Reason for consult:  dizziness  HISTORY OF PRESENT ILLNESS: Brooke Stafford is a 60 year old right-handed woman with hypertension, peripheral vascular disease, hyperlipidemia, chronic renal disease, CHF, type 2 diabetes, gout, CAD status post open heart surgery and previous smoker who presents for dizziness.  History obtained by patient, PCP notes and hospital notes.  She has multiple medical comorbidities.  She has CKD stage 3.  Baseline Cr is between 1.5 and 2.  Her son was shot and killed in April, which caused significant depression and anxiety.  She presented to the ED on 10/22/15 after she woke up with dizziness, described as lightheadedness but not spinning.  Prior notes mention double vision, but she denies this.  She denied slurred speech or unilateral weakness or numbness.  CT of head showed mild chronic small vessel ischemic changes with old lacunar infarcts in the left cerebellum and right internal capsule but nothing acute. BUN was 23 and Cr was 2.46.  She was feeling anxious (forgot to take her clonazepam) and was not keeping hydrated.  She had a follow up carotid doppler performed on 10/25/15, which revealed no hemodynamically significant stenosis.  An MRI of the brain was ordered, but she never had it performed due to claustrophobia.  An echocardiogram from 12/19/15 showed EF 40-45% with grade 1 diastolic dysfunction.  Hgb A1c from 11/02/15 was 6.2.  Dizziness lasted 2 days.  Of note, she was admitted for acute on chronic CHF and acute on chronic renal failure.  Recent labs include CBC with WBC 5.8, HGB 10.3, HCT 31.7 and PLT 201; normal iron panel and ferritin of 100; BMP with Na 140, K 4.4, Cl 104, glucose 219, BUN 40, Cr 3.38, GFR 16.  Most recent renal function from 01/07/16 demonstrated BUN 31, Cr 2.94 and GFR 19.  She reports feeling  lightheadedness, sleepy and unsteady but no longer with the dizziness.    PAST MEDICAL HISTORY: Past Medical History:  Diagnosis Date  . Anemia   . Anginal pain ([place])   . Anxiety   . Arthritis    "stiff fingers and knees" (08/04/2013), (12/12/2014)  . Asthma   . CAD (coronary artery disease) 2002; 2015   CABG x 6 2002, cath 2011- med Rx stent DES VG-Diag  . CAD (coronary artery disease) of artery bypass graft; DES to VG-Diag 09/28/13 11/09/2013  . Cataract   . CHF (congestive heart failure) ([place])    "in 2002" (11/26/2012)  . Chronic bronchitis ([place])    "q year; in the winter"   . Chronic renal insufficiency, stage II (mild)    followed  by [facility]  . Coronary artery disease 2002   CABG x 6. Cath 5/11- med Rx  . GERD (gastroesophageal reflux disease)   . Gout    "right big toe"  . Headache    "~ q week" (08/04/2013); "~ twice/month" (12/12/2014)  . History of blood transfusion 2002   "when I had OHS"  . Hyperlipidemia   . Hypertension   . Hypothyroid    treated  . Migraines    "couple times/year" (08/04/2013), (12/12/2014)  . Myocardial infarction 2000; 2002; 2011  . Obesity (BMI 35.0-39.9 without comorbidity)   . Peripheral vascular disease ([place]) 12/12   LSFA PTA  . Pneumonia    "3 times I think" (12/12/2014)  . Type II diabetes mellitus ([place])  PAST SURGICAL HISTORY: Past Surgical History:  Procedure Laterality Date  . ABDOMINAL AORTAGRAM N/A 04/05/2011   Procedure: ABDOMINAL AORTAGRAM;  Surgeon: Lorretta Harp, MD;  Location: [facility] CATH LAB;  Service: Cardiovascular;  Laterality: N/A;  . APPENDECTOMY  1980  . BREAST CYST EXCISION Right 1970's  . CARDIAC CATHETERIZATION  2002  . CARDIAC CATHETERIZATION N/A 12/12/2014   Procedure: Left Heart Cath and Cors/Grafts Angiography;  Surgeon: Peter M Martinique, MD;  Location: [place] CV LAB;  Service: Cardiovascular;  Laterality: N/A;  . CARDIAC CATHETERIZATION N/A 12/12/2014   Procedure: Coronary Stent Intervention;   Surgeon: Peter M Martinique, MD;  Location: [place] CV LAB;  Service: Cardiovascular;  Laterality: N/A;  . [place]; 1980  . CHOLECYSTECTOMY  1982  . CORONARY ANGIOPLASTY WITH STENT PLACEMENT  2004; 2012   "I have 2 stents" (08/04/2013)  . CORONARY ANGIOPLASTY WITH STENT PLACEMENT  09/28/13   PTCA/ DES Xience stent to VG-Diag   . CORONARY ARTERY BYPASS GRAFT  11/20/2000   x6 LIMA to distal LAD, svg to first diag, svg to ramus intermediate branch and swquential SVG to cir marginal branch, SVG to posterior descending coronary and sequential SVG to first right posterolateral branch  . LEFT HEART CATHETERIZATION WITH CORONARY/GRAFT ANGIOGRAM N/A 09/28/2013   Procedure: LEFT HEART CATHETERIZATION WITH Beatrix Fetters;  Surgeon: Peter M Martinique, MD;  Location: [facility] CATH LAB;  Service: Cardiovascular;  Laterality: N/A;  . LOWER EXTREMITY ANGIOGRAM  12/01/2012   Procedure: LOWER EXTREMITY ANGIOGRAM;  Surgeon: Lorretta Harp, MD;  Location: [facility] CATH LAB;  Service: Cardiovascular;;  . NM MYOCAR PERF WALL MOTION  08/27/2004   negative  . PERCUTANEOUS STENT INTERVENTION Left 12/01/2012   Procedure: PERCUTANEOUS STENT INTERVENTION;  Surgeon: Lorretta Harp, MD;  Location: [facility] CATH LAB;  Service: Cardiovascular;  Laterality: Left;  Left SFA  . PERIPHERAL ARTERIAL STENT GRAFT Left    SFA/notes 04/07/2011 (11/30/2012)  . RENAL ANGIOGRAM N/A 04/05/2011   Procedure: RENAL ANGIOGRAM;  Surgeon: Lorretta Harp, MD;  Location: [facility] CATH LAB;  Service: Cardiovascular;  Laterality: N/A;  . TUBAL LIGATION  1980    MEDICATIONS: Current Outpatient Prescriptions on File Prior to Visit  Medication Sig Dispense Refill  . acetaminophen-codeine (TYLENOL #3) 300-30 MG tablet Take 1 tablet by mouth every 4 (four) hours as needed for moderate pain. 60 tablet 0  . albuterol (PROVENTIL HFA;VENTOLIN HFA) 108 (90 Base) MCG/ACT inhaler Inhale 2 puffs into the lungs every 6 (six) hours as needed for wheezing or  shortness of breath. 3 Inhaler 3  . allopurinol (ZYLOPRIM) 300 MG tablet Take 1 tablet (300 mg total) by mouth daily. 30 tablet 3  . amLODipine (NORVASC) 10 MG tablet take 1 tablet by mouth once daily (Patient taking differently: Take 10 mg by mouth daily) 30 tablet 11  . aspirin 81 MG chewable tablet Chew 1 tablet (81 mg total) by mouth daily. 30 tablet 10  . atorvastatin (LIPITOR) 40 MG tablet Take 1 tablet (40 mg total) by mouth daily. 30 tablet 3  . cloNIDine (CATAPRES) 0.3 MG tablet Take 1 tablet (0.3 mg total) by mouth every 6 (six) hours. 120 tablet 3  . clopidogrel (PLAVIX) 75 MG tablet Take 1 tablet (75 mg total) by mouth daily. 90 tablet 3  . ezetimibe (ZETIA) 10 MG tablet Take 1 tablet (10 mg total) by mouth daily. 90 tablet 3  . fluticasone (FLONASE) 50 MCG/ACT nasal spray Place 2 sprays into both nostrils daily as  needed for allergies or rhinitis. 16 g 3  . furosemide (LASIX) 40 MG tablet Take 2 tablets (80 mg total) by mouth daily. 180 tablet 3  . gabapentin (NEURONTIN) 400 MG capsule take 1 capsule by mouth three times a day 270 capsule 0  . insulin aspart (NOVOLOG FLEXPEN) 100 UNIT/ML FlexPen Inject 10-30 units into the skin 3 times daily, plus sliding scale as instructed. 30 mL 5  . Insulin Glargine (TOUJEO SOLOSTAR) 300 UNIT/ML SOPN Inject 40 Units into the skin at bedtime. 6 pen 5  . isosorbide mononitrate (IMDUR) 120 MG 24 hr tablet Take 1 tablet (120 mg total) by mouth daily. 90 tablet 3  . levothyroxine (SYNTHROID, LEVOTHROID) 25 MCG tablet take 1 tablet by mouth every morning BEFORE BREAKFAST (Patient taking differently: Take 25 mcg by mouth every morning) 30 tablet 3  . loratadine (CLARITIN) 10 MG tablet Take 1 tablet (10 mg total) by mouth daily as needed for allergies. 30 tablet 11  . magnesium citrate SOLN Take 296 mLs (1 Bottle total) by mouth as needed for severe constipation. 195 mL 5  . metoprolol succinate (TOPROL-XL) 50 MG 24 hr tablet Take 3 tablets (150 mg total)  by mouth daily. Take with or immediately following a meal. 90 tablet 5  . Naphazoline HCl (CLEAR EYES OP) Place 1 drop into both eyes as needed (for dry eyes).    . nitroGLYCERIN (NITROSTAT) 0.4 MG SL tablet Place 1 tablet (0.4 mg total) under the tongue every 5 (five) minutes as needed. For chest 30 tablet 6  . ranitidine (ZANTAC 75) 75 MG tablet Take 1 tablet (75 mg total) by mouth 2 (two) times daily. (Patient taking differently: Take 75 mg by mouth 2 (two) times daily as needed for heartburn. ) 60 tablet 3  . senna-docusate (SENOKOT-S) 8.6-50 MG tablet Take 1 tablet by mouth at bedtime as needed for mild constipation. 30 tablet 0  . traZODone (DESYREL) 50 MG tablet Take 1 tablet (50 mg total) by mouth at bedtime. (Patient taking differently: Take 50 mg by mouth at bedtime as needed for sleep. ) 90 tablet 3  . triamcinolone ointment (KENALOG) 0.5 % Apply 1 application topically 2 (two) times daily. (Patient taking differently: Apply 1 application topically daily as needed (for skin). ) 30 g 2  . clonazePAM (KLONOPIN) 0.5 MG tablet Take 1 tablet (0.5 mg total) by mouth 2 (two) times daily as needed for anxiety. (Patient taking differently: Take 0.25 mg by mouth 2 (two) times daily as needed for anxiety. ) 60 tablet 1   No current facility-administered medications on file prior to visit.     ALLERGIES: Allergies  Allergen Reactions  . Digoxin And Related Other (See Comments)    Patient stated she almost died. Had flu like symptoms as well as diarrhea.  . Hydralazine Shortness Of Breath  . Lisinopril Other (See Comments)    Felt like she had the flu. Was very sick!!!  . Penicillins Cross Reactors Hives    And high fever  . Adhesive [Tape] Rash    bruising    FAMILY HISTORY: Family History  Problem Relation Age of Onset  . Diabetes Mother   . Hypertension Mother   . Stroke Mother   . Hypertension Father   . Hypertension Brother   . Hypertension Sister   . Diabetes Sister   .  Hyperlipidemia Sister     SOCIAL HISTORY: Social History   Social History  . Marital status: Divorced    Spouse  name: N/A  . Number of children: 2  . Years of education: 14   Occupational History  . Disabled    Social History Main Topics  . Smoking status: Former Smoker    Packs/day: 0.00    Years: 25.00    Types: Cigarettes    Quit date: 04/04/2000  . Smokeless tobacco: Never Used  . Alcohol use No     Comment: 12/12/2014  "have a glass of red wine on my birthday q yr; that's it"  . Drug use: No  . Sexual activity: Not Currently    Birth control/ protection: Abstinence   Other Topics Concern  . Not on file   Social History Narrative   Lives at home with a roommate.   Right-handed.   Occasional caffeine use.   Her 19 year son was shot to death in 2015/08/20.    REVIEW OF SYSTEMS: Constitutional: Fatigue.  No fevers, chills, or sweats, change in appetite Eyes: No visual changes, double vision, eye pain Ear, nose and throat: No hearing loss, ear pain, nasal congestion, sore throat Cardiovascular: No chest pain, palpitations Respiratory:  No shortness of breath at rest or with exertion, wheezes GastrointestinaI: No nausea, vomiting, diarrhea, abdominal pain, fecal incontinence Genitourinary:  No dysuria, urinary retention or frequency Musculoskeletal:  Neck pain Integumentary: No rash, pruritus, skin lesions Neurological: as above Psychiatric: depression, anxiety Endocrine: fatigue Hematologic/Lymphatic:  No purpura, petechiae. Allergic/Immunologic: no itchy/runny eyes, nasal congestion, recent allergic reactions, rashes  PHYSICAL EXAM: Vitals:   02/14/16 1002  BP: 140/82  Pulse: 71   General: No acute distress.  Patient appears well-groomed.  Head:  Normocephalic/atraumatic Eyes:  fundi examined but not visualized Neck: supple, no paraspinal tenderness, full range of motion Back: No paraspinal tenderness Heart: regular rate and rhythm Lungs: Clear to  auscultation bilaterally. Vascular: No carotid bruits. Neurological Exam: Mental status: alert and oriented to person, place, and time, recent and remote memory intact, fund of knowledge intact, attention and concentration intact, speech fluent and not dysarthric, language intact. Cranial nerves: CN I: not tested CN II: pupils equal, round and reactive to light, visual fields intact CN III, IV, VI:  full range of motion, no nystagmus, no ptosis CN V: facial sensation intact CN VII: upper and lower face symmetric CN VIII: hearing intact CN IX, X: gag intact, uvula midline CN XI: sternocleidomastoid and trapezius muscles intact CN XII: tongue midline Bulk & Tone: normal, no fasciculations. Motor:  5/5 throughout  Sensation:  Pinprick and vibration sensation intact. Deep Tendon Reflexes:  2+ throughout, toes downgoing. Finger to nose testing:  Without dysmetria.  Heel to shin:  Without dysmetria.  Gait:  Mildly wide-based.  Able to turn but unable to tandem walk. Romberg negative.  IMPRESSION: 1.  Dizziness.  Unclear if it was truly a cerebrovascular event as she did not present with any clear lateralizing symptoms.  She had nonspecific dizziness (not clear vertigo) and unsteady gait but no diplopia. Unilateral dysmetria or unilateral numbness or weakness.  It could have been related to anxiety or related to dehydration in a patient with CKD.   2.  Multiple medical co-morbidities, including cerebrovascular disease, type 2 diabetes, hypertension, CKD stage 3, CHF, anemia and CAD. 3.  Adjustment disorder and anxiety  PLAN: 1.  She is already on therapy for secondary stroke prevention anyway (Plavix, Lipitor, medications for blood pressure and glycemic control) 2.  Recommend seeing counselor/therapist 3.  Address other comorbidities with PCP and specialists (renal, cardiology) 4.  Follow  up as needed.  Thank you for allowing me to take part in the care of this patient.  Metta Clines,  DO  CC:  Angelica Chessman, MD

## 2016-02-19 DIAGNOSIS — E113411 Type 2 diabetes mellitus with severe nonproliferative diabetic retinopathy with macular edema, right eye: Secondary | ICD-10-CM | POA: Diagnosis not present

## 2016-02-19 DIAGNOSIS — H3561 Retinal hemorrhage, right eye: Secondary | ICD-10-CM | POA: Diagnosis not present

## 2016-02-19 DIAGNOSIS — E113412 Type 2 diabetes mellitus with severe nonproliferative diabetic retinopathy with macular edema, left eye: Secondary | ICD-10-CM | POA: Diagnosis not present

## 2016-02-20 ENCOUNTER — Encounter (HOSPITAL_COMMUNITY): Payer: Self-pay | Admitting: *Deleted

## 2016-02-20 ENCOUNTER — Encounter (HOSPITAL_COMMUNITY)
Admission: RE | Admit: 2016-02-20 | Discharge: 2016-02-20 | Disposition: A | Discharge status: Home / Self Care | Payer: Medicare Other | Source: Ambulatory Visit | Attending: Nephrology | Admitting: Nephrology

## 2016-02-20 ENCOUNTER — Emergency Department (HOSPITAL_COMMUNITY): Payer: Medicare Other

## 2016-02-20 ENCOUNTER — Other Ambulatory Visit: Payer: Self-pay

## 2016-02-20 ENCOUNTER — Emergency Department (HOSPITAL_COMMUNITY)
Admission: EM | Admit: 2016-02-20 | Discharge: 2016-02-20 | Disposition: A | Discharge status: Home / Self Care | Payer: Medicare Other | Attending: Emergency Medicine | Admitting: Emergency Medicine

## 2016-02-20 DIAGNOSIS — Z7982 Long term (current) use of aspirin: Secondary | ICD-10-CM | POA: Diagnosis not present

## 2016-02-20 DIAGNOSIS — Z79899 Other long term (current) drug therapy: Secondary | ICD-10-CM | POA: Insufficient documentation

## 2016-02-20 DIAGNOSIS — N183 Chronic kidney disease, stage 3 unspecified: Secondary | ICD-10-CM

## 2016-02-20 DIAGNOSIS — D631 Anemia in chronic kidney disease: Secondary | ICD-10-CM | POA: Insufficient documentation

## 2016-02-20 DIAGNOSIS — N184 Chronic kidney disease, stage 4 (severe): Secondary | ICD-10-CM | POA: Diagnosis present

## 2016-02-20 DIAGNOSIS — Z955 Presence of coronary angioplasty implant and graft: Secondary | ICD-10-CM | POA: Insufficient documentation

## 2016-02-20 DIAGNOSIS — Z5181 Encounter for therapeutic drug level monitoring: Secondary | ICD-10-CM | POA: Insufficient documentation

## 2016-02-20 DIAGNOSIS — Z794 Long term (current) use of insulin: Secondary | ICD-10-CM | POA: Diagnosis not present

## 2016-02-20 DIAGNOSIS — R05 Cough: Secondary | ICD-10-CM | POA: Diagnosis not present

## 2016-02-20 DIAGNOSIS — Z87891 Personal history of nicotine dependence: Secondary | ICD-10-CM | POA: Diagnosis not present

## 2016-02-20 DIAGNOSIS — B9789 Other viral agents as the cause of diseases classified elsewhere: Secondary | ICD-10-CM

## 2016-02-20 DIAGNOSIS — E11319 Type 2 diabetes mellitus with unspecified diabetic retinopathy without macular edema: Secondary | ICD-10-CM | POA: Insufficient documentation

## 2016-02-20 DIAGNOSIS — I252 Old myocardial infarction: Secondary | ICD-10-CM | POA: Diagnosis not present

## 2016-02-20 DIAGNOSIS — J069 Acute upper respiratory infection, unspecified: Secondary | ICD-10-CM | POA: Diagnosis not present

## 2016-02-20 DIAGNOSIS — I5023 Acute on chronic systolic (congestive) heart failure: Secondary | ICD-10-CM | POA: Diagnosis not present

## 2016-02-20 DIAGNOSIS — J45909 Unspecified asthma, uncomplicated: Secondary | ICD-10-CM | POA: Insufficient documentation

## 2016-02-20 DIAGNOSIS — R0789 Other chest pain: Secondary | ICD-10-CM

## 2016-02-20 DIAGNOSIS — Z951 Presence of aortocoronary bypass graft: Secondary | ICD-10-CM | POA: Insufficient documentation

## 2016-02-20 DIAGNOSIS — E039 Hypothyroidism, unspecified: Secondary | ICD-10-CM | POA: Insufficient documentation

## 2016-02-20 DIAGNOSIS — E1140 Type 2 diabetes mellitus with diabetic neuropathy, unspecified: Secondary | ICD-10-CM | POA: Insufficient documentation

## 2016-02-20 DIAGNOSIS — I130 Hypertensive heart and chronic kidney disease with heart failure and stage 1 through stage 4 chronic kidney disease, or unspecified chronic kidney disease: Secondary | ICD-10-CM | POA: Insufficient documentation

## 2016-02-20 DIAGNOSIS — I2510 Atherosclerotic heart disease of native coronary artery without angina pectoris: Secondary | ICD-10-CM | POA: Diagnosis not present

## 2016-02-20 DIAGNOSIS — R079 Chest pain, unspecified: Secondary | ICD-10-CM | POA: Diagnosis present

## 2016-02-20 LAB — BASIC METABOLIC PANEL
ANION GAP: 7 (ref 5–15)
BUN: 31 mg/dL — ABNORMAL HIGH (ref 6–20)
CALCIUM: 9.7 mg/dL (ref 8.9–10.3)
CO2: 27 mmol/L (ref 22–32)
Chloride: 110 mmol/L (ref 101–111)
Creatinine, Ser: 3.08 mg/dL — ABNORMAL HIGH (ref 0.44–1.00)
GFR, EST AFRICAN AMERICAN: 18 mL/min — AB (ref >60)
GFR, EST NON AFRICAN AMERICAN: 15 mL/min — AB (ref >60)
Glucose, Bld: 139 mg/dL — ABNORMAL HIGH (ref 65–99)
Potassium: 3.6 mmol/L (ref 3.5–5.1)
Sodium: 144 mmol/L (ref 135–145)

## 2016-02-20 LAB — I-STAT TROPONIN, ED
TROPONIN I, POC: 0.01 ng/mL (ref 0.00–0.08)
Troponin i, poc: 0.01 ng/mL (ref 0.00–0.08)

## 2016-02-20 LAB — BRAIN NATRIURETIC PEPTIDE: B NATRIURETIC PEPTIDE 5: 1657.1 pg/mL — AB (ref 0.0–100.0)

## 2016-02-20 LAB — CBC
HCT: 33.3 % — ABNORMAL LOW (ref 36.0–46.0)
HEMOGLOBIN: 11.2 g/dL — AB (ref 12.0–15.0)
MCH: 30.8 pg (ref 26.0–34.0)
MCHC: 33.6 g/dL (ref 30.0–36.0)
MCV: 91.5 fL (ref 78.0–100.0)
PLATELETS: 262 10*3/uL (ref 150–400)
RBC: 3.64 MIL/uL — AB (ref 3.87–5.11)
RDW: 13.4 % (ref 11.5–15.5)
WBC: 6.9 10*3/uL (ref 4.0–10.5)

## 2016-02-20 LAB — IRON AND TIBC
Iron: 67 ug/dL (ref 28–170)
SATURATION RATIOS: 25 % (ref 10.4–31.8)
TIBC: 265 ug/dL (ref 250–450)
UIBC: 198 ug/dL

## 2016-02-20 LAB — POCT HEMOGLOBIN-HEMACUE: Hemoglobin: 10.4 g/dL — ABNORMAL LOW (ref 12.0–15.0)

## 2016-02-20 LAB — FERRITIN: Ferritin: 91 ng/mL (ref 11–307)

## 2016-02-20 MED ORDER — CLONIDINE HCL 0.1 MG PO TABS: 0.1000 mg | ORAL_TABLET | Freq: Once | ORAL | Status: DC | PRN

## 2016-02-20 MED ORDER — DARBEPOETIN ALFA 100 MCG/0.5ML IJ SOSY
100.0000 ug | PREFILLED_SYRINGE | INTRAMUSCULAR | Status: DC
  Administered 2016-02-20: 100 ug via SUBCUTANEOUS

## 2016-02-20 MED ORDER — ACETAMINOPHEN 500 MG PO TABS
1000.0000 mg | ORAL_TABLET | Freq: Once | ORAL | Status: AC
  Administered 2016-02-20: 1000 mg via ORAL
  Filled 2016-02-20: qty 2

## 2016-02-20 MED ORDER — DARBEPOETIN ALFA 100 MCG/0.5ML IJ SOSY
PREFILLED_SYRINGE | INTRAMUSCULAR | Status: AC
  Filled 2016-02-20: qty 0.5

## 2016-02-20 NOTE — ED Provider Notes (Signed)
[facility] DEPT Provider Note   CSN: [id number] Arrival date & time: 02/20/16  1327     History   Chief Complaint Chief Complaint  Patient presents with  . Chest Pain    HPI Brooke Stafford is a 60 y.o. female with a history of CAD, status post CABG, presents with a history of Rhinorrhea, nonproductive cough, sinus congestion for the last 2-3 days. Over the last 1-2 days she has had mild intermittent sharp chest pains in her bilateral anterior chest as well as her upper back that is worse with coughing, palpation, movement. She has had no associated chest tightness or pressure, nausea, vomiting, diaphoresis, fever, or radiation of her sx.   HPI  Past Medical History:  Diagnosis Date  . Anemia   . Anginal pain ([place])   . Anxiety   . Arthritis    "stiff fingers and knees" (08/04/2013), (12/12/2014)  . Asthma   . CAD (coronary artery disease) 2002; 2015   CABG x 6 2002, cath 2011- med Rx stent DES VG-Diag  . CAD (coronary artery disease) of artery bypass graft; DES to VG-Diag 09/28/13 11/09/2013  . Cataract   . CHF (congestive heart failure) ([place])    "in 2002" (11/26/2012)  . Chronic bronchitis ([place])    "q year; in the winter"   . Chronic renal insufficiency, stage II (mild)    followed  by [facility]  . Coronary artery disease 2002   CABG x 6. Cath 5/11- med Rx  . GERD (gastroesophageal reflux disease)   . Gout    "right big toe"  . Headache    "~ q week" (08/04/2013); "~ twice/month" (12/12/2014)  . History of blood transfusion 2002   "when I had OHS"  . Hyperlipidemia   . Hypertension   . Hypothyroid    treated  . Migraines    "couple times/year" (08/04/2013), (12/12/2014)  . Myocardial infarction 2000; 2002; 2011  . Obesity (BMI 35.0-39.9 without comorbidity)   . Peripheral vascular disease ([place]) 12/12   LSFA PTA  . Pneumonia    "3 times I think" (12/12/2014)  . Type II diabetes mellitus [facility])     Patient Active Problem List   Diagnosis Date Noted  .  Anemia of chronic disease 12/27/2015  . Acute on chronic systolic CHF (congestive heart failure) ([place])   . Acute CHF ([place]) 12/18/2015  . CHF exacerbation ([place]) 12/18/2015  . Constipation 12/18/2015  . Adjustment disorder with anxious mood 11/02/2015  . Posterior circulation stroke ([place]) 11/02/2015  . Abnormality of gait 10/23/2015  . Depression 09/28/2015  . Diabetes mellitus type 2 in obese ([place]) 09/13/2015  . Diabetic retinopathy ([place]) 09/13/2015  . HTN (hypertension), benign 08/24/2015  . Grief reaction 08/24/2015  . Pain in the chest   . Acute kidney injury superimposed on chronic kidney disease ([place]) 07/25/2015  . Creatinine elevation 07/25/2015  . Chest pain at rest 07/25/2015  . Chronic combined systolic and diastolic heart failure ([place]) 07/25/2015  . Rash of hands 02/23/2015  . Essential hypertension, benign 02/23/2015  . Essential hypertension   . Angina pectoris ([place]) 12/12/2014  . Abnormal nuclear stress test   . Acute combined systolic and diastolic heart failure ([place]) 12/02/2014  . Seasonal allergies 08/11/2014  . Gout of big toe 08/11/2014  . Encounter for screening mammogram for breast cancer 05/16/2014  . Screening for colon cancer 05/16/2014  . Diabetic neuropathy, type II diabetes mellitus ([place]) 01/27/2014  . Atopic eczema 01/27/2014  . Precordial pain, atypical,  negative MI, Musculature Skeletal pain  11/08/2013  . CKD (chronic kidney disease), stage III 11/08/2013  . Unstable angina ([place]) 09/28/2013  . Ischemic cardiomyopathy- new drop in EF 08/30/2013  . Chest pain 08/04/2013  . CAD -S/P PCI June 2015 and 12/14/14 02/01/2013  . Hypothyroidism 02/01/2013  . PVD, LSFA PTA 12/12 04/06/2011  . HTN (hypertension), malignant, patent renal arteries 04/06/2011  . Hx of CABG x 6 2002 04/06/2011  . Dyslipidemia 04/06/2011    Past Surgical History:  Procedure Laterality Date  . ABDOMINAL AORTAGRAM N/A 04/05/2011   Procedure: ABDOMINAL AORTAGRAM;  Surgeon:  Lorretta Harp, MD;  Location: [facility] CATH LAB;  Service: Cardiovascular;  Laterality: N/A;  . APPENDECTOMY  1980  . BREAST CYST EXCISION Right 1970's  . CARDIAC CATHETERIZATION  2002  . CARDIAC CATHETERIZATION N/A 12/12/2014   Procedure: Left Heart Cath and Cors/Grafts Angiography;  Surgeon: Peter M Martinique, MD;  Location: [facility] CV LAB;  Service: Cardiovascular;  Laterality: N/A;  . CARDIAC CATHETERIZATION N/A 12/12/2014   Procedure: Coronary Stent Intervention;  Surgeon: Peter M Martinique, MD;  Location: [facility] CV LAB;  Service: Cardiovascular;  Laterality: N/A;  . [place]; 1980  . CHOLECYSTECTOMY  1982  . CORONARY ANGIOPLASTY WITH STENT PLACEMENT  2004; 2012   "I have 2 stents" (08/04/2013)  . CORONARY ANGIOPLASTY WITH STENT PLACEMENT  09/28/13   PTCA/ DES Xience stent to VG-Diag   . CORONARY ARTERY BYPASS GRAFT  11/20/2000   x6 LIMA to distal LAD, svg to first diag, svg to ramus intermediate branch and swquential SVG to cir marginal branch, SVG to posterior descending coronary and sequential SVG to first right posterolateral branch  . LEFT HEART CATHETERIZATION WITH CORONARY/GRAFT ANGIOGRAM N/A 09/28/2013   Procedure: LEFT HEART CATHETERIZATION WITH Beatrix Fetters;  Surgeon: Peter M Martinique, MD;  Location: [facility] CATH LAB;  Service: Cardiovascular;  Laterality: N/A;  . LOWER EXTREMITY ANGIOGRAM  12/01/2012   Procedure: LOWER EXTREMITY ANGIOGRAM;  Surgeon: Lorretta Harp, MD;  Location: [facility] CATH LAB;  Service: Cardiovascular;;  . NM MYOCAR PERF WALL MOTION  08/27/2004   negative  . PERCUTANEOUS STENT INTERVENTION Left 12/01/2012   Procedure: PERCUTANEOUS STENT INTERVENTION;  Surgeon: Lorretta Harp, MD;  Location: [facility] CATH LAB;  Service: Cardiovascular;  Laterality: Left;  Left SFA  . PERIPHERAL ARTERIAL STENT GRAFT Left    SFA/notes 04/07/2011 (11/30/2012)  . RENAL ANGIOGRAM N/A 04/05/2011   Procedure: RENAL ANGIOGRAM;  Surgeon: Lorretta Harp, MD;  Location: [facility] CATH  LAB;  Service: Cardiovascular;  Laterality: N/A;  . TUBAL LIGATION  1980    OB History    No data available       Home Medications    Prior to Admission medications   Medication Sig Start Date End Date Taking? Authorizing Provider  acetaminophen-codeine (TYLENOL #3) 300-30 MG tablet Take 1 tablet by mouth every 4 (four) hours as needed for moderate pain. 01/11/16  Yes Tresa Garter, MD  albuterol (PROVENTIL HFA;VENTOLIN HFA) 108 (90 Base) MCG/ACT inhaler Inhale 2 puffs into the lungs every 6 (six) hours as needed for wheezing or shortness of breath. 09/28/15  Yes Tresa Garter, MD  amLODipine (NORVASC) 10 MG tablet take 1 tablet by mouth once daily Patient taking differently: Take 10 mg by mouth daily 11/20/15  Yes Mihai Croitoru, MD  aspirin 81 MG chewable tablet Chew 1 tablet (81 mg total) by mouth daily. 02/24/15  Yes Mihai Croitoru, MD  atorvastatin (LIPITOR) 40 MG  tablet Take 1 tablet (40 mg total) by mouth daily. 07/28/15  Yes Ripudeep Krystal Eaton, MD  cloNIDine (CATAPRES) 0.3 MG tablet Take 1 tablet (0.3 mg total) by mouth every 6 (six) hours. 01/11/16  Yes Tresa Garter, MD  clopidogrel (PLAVIX) 75 MG tablet Take 1 tablet (75 mg total) by mouth daily. 09/25/15  Yes Mihai Croitoru, MD  ezetimibe (ZETIA) 10 MG tablet Take 1 tablet (10 mg total) by mouth daily. 01/11/16  Yes Tresa Garter, MD  fluticasone (FLONASE) 50 MCG/ACT nasal spray Place 2 sprays into both nostrils daily as needed for allergies or rhinitis. 09/28/15  Yes Tresa Garter, MD  furosemide (LASIX) 40 MG tablet Take 2 tablets (80 mg total) by mouth daily. 01/11/16  Yes Tresa Garter, MD  gabapentin (NEURONTIN) 400 MG capsule take 1 capsule by mouth three times a day 01/24/16  Yes Olugbemiga E Doreene Burke, MD  insulin aspart (NOVOLOG FLEXPEN) 100 UNIT/ML FlexPen Inject 10-30 units into the skin 3 times daily, plus sliding scale as instructed. 12/14/15  Yes Philemon Kingdom, MD  Insulin Glargine (TOUJEO  SOLOSTAR) 300 UNIT/ML SOPN Inject 40 Units into the skin at bedtime. 09/13/15  Yes Philemon Kingdom, MD  isosorbide mononitrate (IMDUR) 120 MG 24 hr tablet Take 1 tablet (120 mg total) by mouth daily. 01/11/16  Yes Tresa Garter, MD  levothyroxine (SYNTHROID, LEVOTHROID) 25 MCG tablet take 1 tablet by mouth every morning BEFORE BREAKFAST Patient taking differently: Take 25 mcg by mouth every morning 11/24/15  Yes Tresa Garter, MD  loratadine (CLARITIN) 10 MG tablet Take 1 tablet (10 mg total) by mouth daily as needed for allergies. 12/15/14  Yes Luke K Kilroy, PA-C  magnesium citrate SOLN Take 296 mLs (1 Bottle total) by mouth as needed for severe constipation. 07/28/15  Yes Ripudeep Krystal Eaton, MD  metoprolol succinate (TOPROL-XL) 50 MG 24 hr tablet Take 3 tablets (150 mg total) by mouth daily. Take with or immediately following a meal. 07/28/15  Yes Ripudeep K Rai, MD  Naphazoline HCl (CLEAR EYES OP) Place 1 drop into both eyes as needed (for dry eyes).   Yes Historical Provider, MD  ranitidine (ZANTAC 75) 75 MG tablet Take 1 tablet (75 mg total) by mouth 2 (two) times daily. Patient taking differently: Take 75 mg by mouth 2 (two) times daily as needed for heartburn.  07/28/15  Yes Ripudeep Krystal Eaton, MD  senna-docusate (SENOKOT-S) 8.6-50 MG tablet Take 1 tablet by mouth at bedtime as needed for mild constipation. 12/23/15  Yes Virgel Manifold, MD  traZODone (DESYREL) 50 MG tablet Take 1 tablet (50 mg total) by mouth at bedtime. Patient taking differently: Take 50 mg by mouth at bedtime as needed for sleep.  09/28/15  Yes Tresa Garter, MD  allopurinol (ZYLOPRIM) 300 MG tablet Take 1 tablet (300 mg total) by mouth daily. 02/21/16   Maren Reamer, MD  clonazePAM (KLONOPIN) 0.5 MG tablet Take 1 tablet (0.5 mg total) by mouth 2 (two) times daily as needed for anxiety. Patient taking differently: Take 0.25 mg by mouth 2 (two) times daily as needed for anxiety.  11/30/15 12/30/15  Tresa Garter, MD    nitroGLYCERIN (NITROSTAT) 0.4 MG SL tablet Place 1 tablet (0.4 mg total) under the tongue every 5 (five) minutes as needed. For chest 11/10/14   Tresa Garter, MD  triamcinolone ointment (KENALOG) 0.5 % Apply 1 application topically 2 (two) times daily. Patient taking differently: Apply 1 application topically daily as needed (  for skin).  08/24/15   Tresa Garter, MD    Family History Family History  Problem Relation Age of Onset  . Diabetes Mother   . Hypertension Mother   . Stroke Mother   . Hypertension Father   . Hypertension Brother   . Hypertension Sister   . Diabetes Sister   . Hyperlipidemia Sister     Social History Social History  Substance Use Topics  . Smoking status: Former Smoker    Packs/day: 0.00    Years: 25.00    Types: Cigarettes    Quit date: 04/04/2000  . Smokeless tobacco: Never Used  . Alcohol use No     Comment: 12/12/2014  "have a glass of red wine on my birthday q yr; that's it"     Allergies   Digoxin and related; Hydralazine; Lisinopril; Penicillins cross reactors; and Adhesive [tape]   Review of Systems Review of Systems  Constitutional: Positive for activity change, chills and fatigue. Negative for appetite change, diaphoresis and fever.  HENT: Positive for congestion, rhinorrhea, sinus pressure and sneezing. Negative for sore throat.   Respiratory: Positive for cough. Negative for chest tightness, shortness of breath and wheezing.   Cardiovascular: Positive for chest pain. Negative for palpitations.  Gastrointestinal: Negative for abdominal pain, nausea and vomiting.  Musculoskeletal: Negative for back pain and neck pain.  Skin: Negative for rash.  Neurological: Negative for syncope, light-headedness and headaches.  All other systems reviewed and are negative.    Physical Exam Updated Vital Signs BP 120/81   Pulse (!) 57   Temp 98 F (36.7 C) (Oral)   Resp 13   LMP 10/13/2014 (Exact Date)   SpO2 100%   Physical Exam   Constitutional: She is oriented to person, place, and time. She appears well-developed and well-nourished. No distress.  HENT:  Head: Normocephalic and atraumatic.  Nose: Nose normal.  Mouth/Throat: Oropharynx is clear and moist.  Eyes: Conjunctivae and EOM are normal. Pupils are equal, round, and reactive to light.  Neck: Neck supple.  Cardiovascular: Normal rate, regular rhythm, normal heart sounds and intact distal pulses.   Pulmonary/Chest: Effort normal and breath sounds normal. She exhibits tenderness. She exhibits no crepitus.    Abdominal: Soft. She exhibits no distension. There is no tenderness.  Musculoskeletal: She exhibits no edema or tenderness.  Neurological: She is alert and oriented to person, place, and time. No cranial nerve deficit. Coordination normal.  Skin: Skin is warm and dry. No rash noted. She is not diaphoretic.  Nursing note and vitals reviewed.    ED Treatments / Results  Labs (all labs ordered are listed, but only abnormal results are displayed) Labs Reviewed  BASIC METABOLIC PANEL - Abnormal; Notable for the following:       Result Value   Glucose, Bld 139 (*)    BUN 31 (*)    Creatinine, Ser 3.08 (*)    GFR calc non Af Amer 15 (*)    GFR calc Af Amer 18 (*)    All other components within normal limits  CBC - Abnormal; Notable for the following:    RBC 3.64 (*)    Hemoglobin 11.2 (*)    HCT 33.3 (*)    All other components within normal limits  BRAIN NATRIURETIC PEPTIDE - Abnormal; Notable for the following:    B Natriuretic Peptide 1,657.1 (*)    All other components within normal limits  I-STAT TROPOININ, ED  I-STAT TROPOININ, ED    EKG  EKG Interpretation  Date/Time:  Tuesday February 20 2016 13:31:34 EST Ventricular Rate:  68 PR Interval:  174 QRS Duration: 94 QT Interval:  438 QTC Calculation: 465 R Axis:   45 Text Interpretation:  Sinus rhythm with occasional Premature ventricular complexes and Fusion complexes ST & T wave  abnormality, consider lateral ischemia Abnormal ECG No acute changes Confirmed by Kathrynn Humble, MD, Thelma Comp [phone]) on 02/20/2016 5:39:42 PM       Radiology Dg Chest 2 View  Result Date: 02/20/2016 CLINICAL DATA:  60 year old female with a history of chest tightness and productive cough EXAM: CHEST  2 VIEW COMPARISON:  12/17/2015, 07/25/2015, 01/26/2007 FINDINGS: Cardiomediastinal silhouette unchanged in size and contour. Surgical changes of median sternotomy and CABG. No evidence of central vascular congestion. Compare to the prior plain film there is significant improved aeration of the bilateral lung bases with resolution of the mixed interstitial and airspace opacities. No confluent airspace disease on the current. No pleural effusion or pneumothorax. IMPRESSION: Significant improved aeration compare to the prior plain film with no evidence on the current plain film of acute cardiopulmonary disease. Surgical changes of prior median sternotomy and CABG. Signed, Dulcy Fanny. Earleen Newport, DO [facility] Rad[facility] Electronically Signed   By: Corrie Mckusick D.O.   On: 02/20/2016 14:53    Procedures Procedures (including critical care time)  Medications Ordered in ED Medications  acetaminophen (TYLENOL) tablet 1,000 mg (1,000 mg Oral Given 02/20/16 1755)     Initial Impression / Assessment and Plan / ED Course  I have reviewed the triage vital signs and the nursing notes.  Pertinent labs & imaging results that were available during my care of the patient were reviewed by me and considered in my medical decision making (see chart for details).  Clinical Course   60 year old female with a history of CAD presents with fire URI symptoms and chest wall tenderness secondary to coughing. Exam as above in no acute distress with tenderness to palpation over the anterior chest wall.  EKG was done and showed no acute changes from previous tracings.  Delta troponin  negative.  Chest x-ray was done and showed no acute cardiopulmonary disease. No evidence of vascular congestion.  Feel this is a viral URI with cough and corresponding chest wall tenderness due to muscle strain. Patient requests discharge. She was recommended OTC pain medication, rest, hydration and follow-up in a few days with her PCP. Strict return precautions were given for worsening or concerns. This plan was discussed with the patient at the bedside and she stated both  understanding and agreement.  Final Clinical Impressions(s) / ED Diagnoses   Final diagnoses:  Viral URI with cough  Tenderness of chest wall    New Prescriptions Discharge Medication List as of 02/20/2016  6:24 PM       Zenovia Jarred, DO 02/21/16 2346    Varney Biles, MD 02/22/16 1322    Varney Biles, MD 02/22/16 1325

## 2016-02-20 NOTE — ED Triage Notes (Signed)
Pt reports cough/congestion/chest pain for several days. Pt states that the pain is central chest to back.

## 2016-02-20 NOTE — ED Notes (Signed)
Pt unable to sign due to signature pad broken.

## 2016-02-21 ENCOUNTER — Ambulatory Visit: Payer: Medicare Other | Attending: Internal Medicine | Admitting: Pharmacist

## 2016-02-21 DIAGNOSIS — I200 Unstable angina: Secondary | ICD-10-CM | POA: Diagnosis not present

## 2016-02-21 DIAGNOSIS — F419 Anxiety disorder, unspecified: Secondary | ICD-10-CM | POA: Diagnosis not present

## 2016-02-21 DIAGNOSIS — Z760 Encounter for issue of repeat prescription: Secondary | ICD-10-CM | POA: Diagnosis not present

## 2016-02-21 DIAGNOSIS — Z79899 Other long term (current) drug therapy: Secondary | ICD-10-CM | POA: Insufficient documentation

## 2016-02-21 DIAGNOSIS — M109 Gout, unspecified: Secondary | ICD-10-CM | POA: Diagnosis not present

## 2016-02-21 MED ORDER — ALLOPURINOL 300 MG PO TABS: 300.0000 mg | ORAL_TABLET | Freq: Every day | ORAL | 0 refills | Status: DC

## 2016-02-21 NOTE — Progress Notes (Signed)
Gathered patient concerns and discussed with Dr. Doreene Burke - patient will follow up with him tomorrow. No changes at this time.

## 2016-02-21 NOTE — Progress Notes (Signed)
The patient presents today for medication management.   Here are the following changes she requests:  Allopurinol: She would like a refill on this medication, she has been out for some time.   Clonazepam: She was referred by her psychiatrist her so that she could be titrated down and be put on a different medication for her anxiety. She describes that when she doesn't take it, she feels overwhelmed and a strong feeling of anxiousness.   Clonidine: When she takes it, when she stands up she feels "starry eyed" and her vision blacks out, she also feels some fatigue. She would like this medication to be reduced.   Isosorbide mononitrate: Currently on a 240 mg tablet, however it is too big for her to swallow, she would like to have the 120 mg tablets again and just take two of them.

## 2016-02-21 NOTE — Patient Instructions (Addendum)
Thanks for coming to see [place]!  Please come see Dr. Doreene Burke tomorrow (02/22/16) at 4pm

## 2016-02-22 ENCOUNTER — Ambulatory Visit: Payer: Medicare Other | Attending: Internal Medicine | Admitting: Internal Medicine

## 2016-02-22 DIAGNOSIS — E039 Hypothyroidism, unspecified: Secondary | ICD-10-CM | POA: Diagnosis not present

## 2016-02-22 DIAGNOSIS — Z880 Allergy status to penicillin: Secondary | ICD-10-CM | POA: Diagnosis not present

## 2016-02-22 DIAGNOSIS — I130 Hypertensive heart and chronic kidney disease with heart failure and stage 1 through stage 4 chronic kidney disease, or unspecified chronic kidney disease: Secondary | ICD-10-CM | POA: Insufficient documentation

## 2016-02-22 DIAGNOSIS — F4322 Adjustment disorder with anxiety: Secondary | ICD-10-CM | POA: Diagnosis not present

## 2016-02-22 DIAGNOSIS — I10 Essential (primary) hypertension: Secondary | ICD-10-CM | POA: Diagnosis not present

## 2016-02-22 DIAGNOSIS — E1122 Type 2 diabetes mellitus with diabetic chronic kidney disease: Secondary | ICD-10-CM | POA: Insufficient documentation

## 2016-02-22 DIAGNOSIS — Z79899 Other long term (current) drug therapy: Secondary | ICD-10-CM | POA: Insufficient documentation

## 2016-02-22 DIAGNOSIS — Z888 Allergy status to other drugs, medicaments and biological substances status: Secondary | ICD-10-CM | POA: Diagnosis not present

## 2016-02-22 DIAGNOSIS — Z7951 Long term (current) use of inhaled steroids: Secondary | ICD-10-CM | POA: Insufficient documentation

## 2016-02-22 DIAGNOSIS — Z794 Long term (current) use of insulin: Secondary | ICD-10-CM | POA: Diagnosis not present

## 2016-02-22 DIAGNOSIS — M1990 Unspecified osteoarthritis, unspecified site: Secondary | ICD-10-CM | POA: Diagnosis not present

## 2016-02-22 DIAGNOSIS — E785 Hyperlipidemia, unspecified: Secondary | ICD-10-CM | POA: Insufficient documentation

## 2016-02-22 DIAGNOSIS — M109 Gout, unspecified: Secondary | ICD-10-CM | POA: Diagnosis not present

## 2016-02-22 DIAGNOSIS — I2510 Atherosclerotic heart disease of native coronary artery without angina pectoris: Secondary | ICD-10-CM | POA: Diagnosis not present

## 2016-02-22 DIAGNOSIS — I110 Hypertensive heart disease with heart failure: Secondary | ICD-10-CM | POA: Diagnosis present

## 2016-02-22 DIAGNOSIS — Z7902 Long term (current) use of antithrombotics/antiplatelets: Secondary | ICD-10-CM | POA: Diagnosis not present

## 2016-02-22 DIAGNOSIS — E119 Type 2 diabetes mellitus without complications: Secondary | ICD-10-CM | POA: Diagnosis not present

## 2016-02-22 DIAGNOSIS — K219 Gastro-esophageal reflux disease without esophagitis: Secondary | ICD-10-CM | POA: Insufficient documentation

## 2016-02-22 DIAGNOSIS — I200 Unstable angina: Secondary | ICD-10-CM

## 2016-02-22 DIAGNOSIS — I252 Old myocardial infarction: Secondary | ICD-10-CM | POA: Diagnosis not present

## 2016-02-22 DIAGNOSIS — Z951 Presence of aortocoronary bypass graft: Secondary | ICD-10-CM | POA: Insufficient documentation

## 2016-02-22 DIAGNOSIS — N183 Chronic kidney disease, stage 3 (moderate): Secondary | ICD-10-CM | POA: Diagnosis not present

## 2016-02-22 DIAGNOSIS — I5042 Chronic combined systolic (congestive) and diastolic (congestive) heart failure: Secondary | ICD-10-CM | POA: Diagnosis not present

## 2016-02-22 DIAGNOSIS — Z8673 Personal history of transient ischemic attack (TIA), and cerebral infarction without residual deficits: Secondary | ICD-10-CM | POA: Insufficient documentation

## 2016-02-22 DIAGNOSIS — E1151 Type 2 diabetes mellitus with diabetic peripheral angiopathy without gangrene: Secondary | ICD-10-CM | POA: Diagnosis not present

## 2016-02-22 DIAGNOSIS — E669 Obesity, unspecified: Secondary | ICD-10-CM | POA: Diagnosis not present

## 2016-02-22 DIAGNOSIS — Z7982 Long term (current) use of aspirin: Secondary | ICD-10-CM | POA: Diagnosis not present

## 2016-02-22 MED ORDER — CLONAZEPAM 0.5 MG PO TABS: 0.2500 mg | ORAL_TABLET | Freq: Two times a day (BID) | ORAL | 1 refills | Status: DC | PRN

## 2016-02-22 MED ORDER — ISOSORBIDE MONONITRATE ER 120 MG PO TB24: 120.0000 mg | ORAL_TABLET | Freq: Every day | ORAL | 3 refills | Status: DC

## 2016-02-22 MED ORDER — ALLOPURINOL 300 MG PO TABS: 300.0000 mg | ORAL_TABLET | Freq: Every day | ORAL | 3 refills | Status: DC

## 2016-02-22 MED ORDER — CLONIDINE HCL 0.2 MG PO TABS: 0.2000 mg | ORAL_TABLET | Freq: Three times a day (TID) | ORAL | 3 refills | Status: DC

## 2016-02-22 MED ORDER — ISOSORBIDE MONONITRATE ER 60 MG PO TB24: 120.0000 mg | ORAL_TABLET | Freq: Every day | ORAL | 3 refills | Status: DC

## 2016-02-22 NOTE — Progress Notes (Signed)
Brooke Stafford, is a 60 y.o. female  SWH:[id number]  YKZ:[id number]  DOB - 1956/02/21  Chief Complaint  Patient presents with  . Blurred Vision      Subjective:   Brooke Stafford is a 60 y.o. female withmultiple medical history including advanced coronary artery disease with previous bypass surgery and stent placement, chronic systolic and diastolic heart failure with occasionalacute exacerbation, insulin requiring type 2 diabetes mellitus complicated by chronic kidney disease stage III-IV, peripheral arterial disease, malignant hypertension, CVA in 07/2017and Hyperlipidemia here today for ED visit follow up. Patient was seen at the ED on 02/20/2016 for URI symptoms and was discharged with recommendation for OTC pain medication, rest, hydration and follow-up in a few days with her PCP. She is gradually feeling better. She also need refill of some of her medications. She thinks her anxiety medicine is too strong, because of excessive drowsiness. She has no other new symptom today. Patient has No headache, No chest pain, No abdominal pain - No Nausea, No new weakness tingling or numbness.   No problems updated.  ALLERGIES: Allergies  Allergen Reactions  . Digoxin And Related Other (See Comments)    Patient stated she almost died. Had flu like symptoms as well as diarrhea.  . Hydralazine Shortness Of Breath  . Lisinopril Other (See Comments)    Felt like she had the flu. Was very sick!!!  . Penicillins Cross Reactors Hives    And high fever  . Adhesive [Tape] Rash    bruising    PAST MEDICAL HISTORY: Past Medical History:  Diagnosis Date  . Anemia   . Anginal pain ([place])   . Anxiety   . Arthritis    "stiff fingers and knees" (08/04/2013), (12/12/2014)  . Asthma   . CAD (coronary artery disease) 2002; 2015   CABG x 6 2002, cath 2011- med Rx stent DES VG-Diag  . CAD (coronary artery disease) of artery bypass graft; DES to VG-Diag 09/28/13 11/09/2013  . Cataract   . CHF  (congestive heart failure) ([place])    "in 2002" (11/26/2012)  . Chronic bronchitis ([place])    "q year; in the winter"   . Chronic renal insufficiency, stage II (mild)    followed  by [facility]  . Coronary artery disease 2002   CABG x 6. Cath 5/11- med Rx  . GERD (gastroesophageal reflux disease)   . Gout    "right big toe"  . Headache    "~ q week" (08/04/2013); "~ twice/month" (12/12/2014)  . History of blood transfusion 2002   "when I had OHS"  . Hyperlipidemia   . Hypertension   . Hypothyroid    treated  . Migraines    "couple times/year" (08/04/2013), (12/12/2014)  . Myocardial infarction 2000; 2002; 2011  . Obesity (BMI 35.0-39.9 without comorbidity)   . Peripheral vascular disease ([place]) 12/12   LSFA PTA  . Pneumonia    "3 times I think" (12/12/2014)  . Type II diabetes mellitus ([place])     MEDICATIONS AT HOME: Prior to Admission medications   Medication Sig Start Date End Date Taking? Authorizing Provider  acetaminophen-codeine (TYLENOL #3) 300-30 MG tablet Take 1 tablet by mouth every 4 (four) hours as needed for moderate pain. 01/11/16   Tresa Garter, MD  albuterol (PROVENTIL HFA;VENTOLIN HFA) 108 (90 Base) MCG/ACT inhaler Inhale 2 puffs into the lungs every 6 (six) hours as needed for wheezing or shortness of breath. 09/28/15   Tresa Garter, MD  allopurinol (ZYLOPRIM) 300 MG  tablet Take 1 tablet (300 mg total) by mouth daily. 02/22/16   Tresa Garter, MD  amLODipine (NORVASC) 10 MG tablet take 1 tablet by mouth once daily Patient taking differently: Take 10 mg by mouth daily 11/20/15   Sanda Klein, MD  aspirin 81 MG chewable tablet Chew 1 tablet (81 mg total) by mouth daily. 02/24/15   Mihai Croitoru, MD  atorvastatin (LIPITOR) 40 MG tablet Take 1 tablet (40 mg total) by mouth daily. 07/28/15   Ripudeep Krystal Eaton, MD  clonazePAM (KLONOPIN) 0.5 MG tablet Take 0.5 tablets (0.25 mg total) by mouth 2 (two) times daily as needed for anxiety. 02/22/16 03/23/16   Tresa Garter, MD  cloNIDine (CATAPRES) 0.2 MG tablet Take 1 tablet (0.2 mg total) by mouth 3 (three) times daily. 02/22/16   Tresa Garter, MD  clopidogrel (PLAVIX) 75 MG tablet Take 1 tablet (75 mg total) by mouth daily. 09/25/15   Mihai Croitoru, MD  ezetimibe (ZETIA) 10 MG tablet Take 1 tablet (10 mg total) by mouth daily. 01/11/16   Tresa Garter, MD  fluticasone (FLONASE) 50 MCG/ACT nasal spray Place 2 sprays into both nostrils daily as needed for allergies or rhinitis. 09/28/15   Tresa Garter, MD  furosemide (LASIX) 40 MG tablet Take 2 tablets (80 mg total) by mouth daily. 01/11/16   Tresa Garter, MD  gabapentin (NEURONTIN) 400 MG capsule take 1 capsule by mouth three times a day 01/24/16   Tresa Garter, MD  insulin aspart (NOVOLOG FLEXPEN) 100 UNIT/ML FlexPen Inject 10-30 units into the skin 3 times daily, plus sliding scale as instructed. 12/14/15   Philemon Kingdom, MD  Insulin Glargine (TOUJEO SOLOSTAR) 300 UNIT/ML SOPN Inject 40 Units into the skin at bedtime. 09/13/15   Philemon Kingdom, MD  isosorbide mononitrate (IMDUR) 120 MG 24 hr tablet Take 1 tablet (120 mg total) by mouth daily. 02/22/16   Tresa Garter, MD  levothyroxine (SYNTHROID, LEVOTHROID) 25 MCG tablet take 1 tablet by mouth every morning BEFORE BREAKFAST Patient taking differently: Take 25 mcg by mouth every morning 11/24/15   Tresa Garter, MD  loratadine (CLARITIN) 10 MG tablet Take 1 tablet (10 mg total) by mouth daily as needed for allergies. 12/15/14   Erlene Quan, PA-C  magnesium citrate SOLN Take 296 mLs (1 Bottle total) by mouth as needed for severe constipation. 07/28/15   Ripudeep Krystal Eaton, MD  metoprolol succinate (TOPROL-XL) 50 MG 24 hr tablet Take 3 tablets (150 mg total) by mouth daily. Take with or immediately following a meal. 07/28/15   Ripudeep Krystal Eaton, MD  Naphazoline HCl (CLEAR EYES OP) Place 1 drop into both eyes as needed (for dry eyes).    Historical Provider, MD    nitroGLYCERIN (NITROSTAT) 0.4 MG SL tablet Place 1 tablet (0.4 mg total) under the tongue every 5 (five) minutes as needed. For chest 11/10/14   Tresa Garter, MD  ranitidine (ZANTAC 75) 75 MG tablet Take 1 tablet (75 mg total) by mouth 2 (two) times daily. Patient taking differently: Take 75 mg by mouth 2 (two) times daily as needed for heartburn.  07/28/15   Ripudeep Krystal Eaton, MD  senna-docusate (SENOKOT-S) 8.6-50 MG tablet Take 1 tablet by mouth at bedtime as needed for mild constipation. 12/23/15   Virgel Manifold, MD  traZODone (DESYREL) 50 MG tablet Take 1 tablet (50 mg total) by mouth at bedtime. Patient taking differently: Take 50 mg by mouth at bedtime as needed for sleep.  09/28/15   Tresa Garter, MD  triamcinolone ointment (KENALOG) 0.5 % Apply 1 application topically 2 (two) times daily. Patient taking differently: Apply 1 application topically daily as needed (for skin).  08/24/15   Tresa Garter, MD    Objective:   Vitals:   02/22/16 1649  BP: (!) 141/71   Exam General appearance : Awake, alert, not in any distress. Speech Clear. Not toxic looking HEENT: Atraumatic and Normocephalic, pupils equally reactive to light and accomodation Neck: Supple, no JVD. No cervical lymphadenopathy.  Chest: Good air entry bilaterally, no added sounds  CVS: S1 S2 regular, no murmurs.  Abdomen: Bowel sounds present, Non tender and not distended with no gaurding, rigidity or rebound. Extremities: B/L Lower Ext shows no edema, both legs are warm to touch Neurology: Awake alert, and oriented X 3, CN II-XII intact, Non focal Skin: No Rash  Data Review Lab Results  Component Value Date   HGBA1C 6.2 11/02/2015   HGBA1C 7.2 (H) 07/25/2015   HGBA1C 6.40 02/23/2015    Assessment & Plan   1. Gout of big toe  - allopurinol (ZYLOPRIM) 300 MG tablet; Take 1 tablet (300 mg total) by mouth daily.  Dispense: 90 tablet; Refill: 3  2. Adjustment disorder with anxious mood Reduce -  clonazePAM (KLONOPIN) 0.5 MG tablet; Take 0.5 tablets (0.25 mg total) by mouth 2 (two) times daily as needed for anxiety.  Dispense: 30 tablet; Refill: 1  3. Essential hypertension, benign  - cloNIDine (CATAPRES) 0.2 MG tablet; Take 1 tablet (0.2 mg total) by mouth 3 (three) times daily.  Dispense: 90 tablet; Refill: 3 - isosorbide mononitrate (IMDUR) 120 MG 24 hr tablet; Take 1 tablet (120 mg total) by mouth daily.  Dispense: 90 tablet; Refill: 3  4. Chronic combined systolic and diastolic congestive heart failure (HCC)  - isosorbide mononitrate (IMDUR) 120 MG 24 hr tablet; Take 1 tablet (120 mg total) by mouth daily.  Dispense: 90 tablet; Refill: 3  Patient have been counseled extensively about nutrition and exercise. Other issues discussed during this visit include: low cholesterol diet, weight control and daily exercise, importance of adherence with medications and regular follow-up. We also discussed long term complications of uncontrolled hypertension.   Return in about 3 months (around 05/24/2016) for Heart Failure and Hypertension, Hemoglobin A1C and Follow up, DM.  The patient was given clear instructions to go to ER or return to medical center if symptoms don't improve, worsen or new problems develop. The patient verbalized understanding. The patient was told to call to get lab results if they haven't heard anything in the next week.   This note has been created with Surveyor, quantity. Any transcriptional errors are unintentional.    Angelica Chessman, MD, [place], [place], [place], [place] and [place], [place]   02/22/2016, 5:18 PM

## 2016-02-22 NOTE — Patient Instructions (Signed)
Hypertension Hypertension, commonly called high blood pressure, is when the force of blood pumping through your arteries is too strong. Your arteries are the blood vessels that carry blood from your heart throughout your body. A blood pressure reading consists of a higher number over a lower number, such as 110/72. The higher number (systolic) is the pressure inside your arteries when your heart pumps. The lower number (diastolic) is the pressure inside your arteries when your heart relaxes. Ideally you want your blood pressure below 120/80. Hypertension forces your heart to work harder to pump blood. Your arteries may become narrow or stiff. Having untreated or uncontrolled hypertension can cause heart attack, stroke, kidney disease, and other problems. RISK FACTORS Some risk factors for high blood pressure are controllable. Others are not.  Risk factors you cannot control include:   Race. You may be at higher risk if you are African American.  Age. Risk increases with age.  Gender. Men are at higher risk than women before age 45 years. After age 65, women are at higher risk than men. Risk factors you can control include:  Not getting enough exercise or physical activity.  Being overweight.  Getting too much fat, sugar, calories, or salt in your diet.  Drinking too much alcohol. SIGNS AND SYMPTOMS Hypertension does not usually cause signs or symptoms. Extremely high blood pressure (hypertensive crisis) may cause headache, anxiety, shortness of breath, and nosebleed. DIAGNOSIS To check if you have hypertension, your health care provider will measure your blood pressure while you are seated, with your arm held at the level of your heart. It should be measured at least twice using the same arm. Certain conditions can cause a difference in blood pressure between your right and left arms. A blood pressure reading that is higher than normal on one occasion does not mean that you need treatment. If  it is not clear whether you have high blood pressure, you may be asked to return on a different day to have your blood pressure checked again. Or, you may be asked to monitor your blood pressure at home for 1 or more weeks. TREATMENT Treating high blood pressure includes making lifestyle changes and possibly taking medicine. Living a healthy lifestyle can help lower high blood pressure. You may need to change some of your habits. Lifestyle changes may include:  Following the DASH diet. This diet is high in fruits, vegetables, and whole grains. It is low in salt, red meat, and added sugars.  Keep your sodium intake below 2,300 mg per day.  Getting at least 30-45 minutes of aerobic exercise at least 4 times per week.  Losing weight if necessary.  Not smoking.  Limiting alcoholic beverages.  Learning ways to reduce stress. Your health care provider may prescribe medicine if lifestyle changes are not enough to get your blood pressure under control, and if one of the following is true:  You are 18-59 years of age and your systolic blood pressure is above 140.  You are 60 years of age or older, and your systolic blood pressure is above 150.  Your diastolic blood pressure is above 90.  You have diabetes, and your systolic blood pressure is over 140 or your diastolic blood pressure is over 90.  You have kidney disease and your blood pressure is above 140/90.  You have heart disease and your blood pressure is above 140/90. Your personal target blood pressure may vary depending on your medical conditions, your age, and other factors. HOME CARE INSTRUCTIONS    Have your blood pressure rechecked as directed by your health care provider.   Take medicines only as directed by your health care provider. Follow the directions carefully. Blood pressure medicines must be taken as prescribed. The medicine does not work as well when you skip doses. Skipping doses also puts you at risk for  problems.  Do not smoke.   Monitor your blood pressure at home as directed by your health care provider. SEEK MEDICAL CARE IF:   You think you are having a reaction to medicines taken.  You have recurrent headaches or feel dizzy.  You have swelling in your ankles.  You have trouble with your vision. SEEK IMMEDIATE MEDICAL CARE IF:  You develop a severe headache or confusion.  You have unusual weakness, numbness, or feel faint.  You have severe chest or abdominal pain.  You vomit repeatedly.  You have trouble breathing. MAKE SURE YOU:   Understand these instructions.  Will watch your condition.  Will get help right away if you are not doing well or get worse.   This information is not intended to replace advice given to you by your health care provider. Make sure you discuss any questions you have with your health care provider.   Document Released: 04/01/2005 Document Revised: 08/16/2014 Document Reviewed: 01/22/2013 Elsevier Interactive Patient Education 2016 [employer]  

## 2016-02-24 ENCOUNTER — Encounter (HOSPITAL_COMMUNITY): Payer: Self-pay

## 2016-02-24 ENCOUNTER — Emergency Department (HOSPITAL_COMMUNITY)
Admission: EM | Admit: 2016-02-24 | Discharge: 2016-02-24 | Disposition: A | Discharge status: Home / Self Care | Payer: Medicare Other | Attending: Emergency Medicine | Admitting: Emergency Medicine

## 2016-02-24 ENCOUNTER — Emergency Department (HOSPITAL_COMMUNITY): Payer: Medicare Other

## 2016-02-24 DIAGNOSIS — I252 Old myocardial infarction: Secondary | ICD-10-CM | POA: Insufficient documentation

## 2016-02-24 DIAGNOSIS — I2510 Atherosclerotic heart disease of native coronary artery without angina pectoris: Secondary | ICD-10-CM | POA: Diagnosis not present

## 2016-02-24 DIAGNOSIS — Z951 Presence of aortocoronary bypass graft: Secondary | ICD-10-CM | POA: Diagnosis not present

## 2016-02-24 DIAGNOSIS — Z7982 Long term (current) use of aspirin: Secondary | ICD-10-CM | POA: Diagnosis not present

## 2016-02-24 DIAGNOSIS — J209 Acute bronchitis, unspecified: Secondary | ICD-10-CM | POA: Insufficient documentation

## 2016-02-24 DIAGNOSIS — I130 Hypertensive heart and chronic kidney disease with heart failure and stage 1 through stage 4 chronic kidney disease, or unspecified chronic kidney disease: Secondary | ICD-10-CM | POA: Insufficient documentation

## 2016-02-24 DIAGNOSIS — E1140 Type 2 diabetes mellitus with diabetic neuropathy, unspecified: Secondary | ICD-10-CM | POA: Diagnosis not present

## 2016-02-24 DIAGNOSIS — Z794 Long term (current) use of insulin: Secondary | ICD-10-CM | POA: Diagnosis not present

## 2016-02-24 DIAGNOSIS — Z955 Presence of coronary angioplasty implant and graft: Secondary | ICD-10-CM | POA: Insufficient documentation

## 2016-02-24 DIAGNOSIS — N183 Chronic kidney disease, stage 3 (moderate): Secondary | ICD-10-CM | POA: Insufficient documentation

## 2016-02-24 DIAGNOSIS — Z87891 Personal history of nicotine dependence: Secondary | ICD-10-CM | POA: Diagnosis not present

## 2016-02-24 DIAGNOSIS — R0602 Shortness of breath: Secondary | ICD-10-CM | POA: Diagnosis not present

## 2016-02-24 DIAGNOSIS — I5042 Chronic combined systolic (congestive) and diastolic (congestive) heart failure: Secondary | ICD-10-CM | POA: Diagnosis not present

## 2016-02-24 DIAGNOSIS — E039 Hypothyroidism, unspecified: Secondary | ICD-10-CM | POA: Diagnosis not present

## 2016-02-24 DIAGNOSIS — R05 Cough: Secondary | ICD-10-CM | POA: Diagnosis not present

## 2016-02-24 LAB — BASIC METABOLIC PANEL
Anion gap: 11 (ref 5–15)
BUN: 30 mg/dL — ABNORMAL HIGH (ref 6–20)
CHLORIDE: 108 mmol/L (ref 101–111)
CO2: 21 mmol/L — ABNORMAL LOW (ref 22–32)
Calcium: 9.7 mg/dL (ref 8.9–10.3)
Creatinine, Ser: 3.04 mg/dL — ABNORMAL HIGH (ref 0.44–1.00)
GFR calc non Af Amer: 16 mL/min — ABNORMAL LOW (ref >60)
GFR, EST AFRICAN AMERICAN: 18 mL/min — AB (ref >60)
Glucose, Bld: 182 mg/dL — ABNORMAL HIGH (ref 65–99)
POTASSIUM: 3.5 mmol/L (ref 3.5–5.1)
SODIUM: 140 mmol/L (ref 135–145)

## 2016-02-24 LAB — CBC
HEMATOCRIT: 37.4 % (ref 36.0–46.0)
Hemoglobin: 12.8 g/dL (ref 12.0–15.0)
MCH: 31.3 pg (ref 26.0–34.0)
MCHC: 34.2 g/dL (ref 30.0–36.0)
MCV: 91.4 fL (ref 78.0–100.0)
Platelets: 265 10*3/uL (ref 150–400)
RBC: 4.09 MIL/uL (ref 3.87–5.11)
RDW: 13.4 % (ref 11.5–15.5)
WBC: 9.7 10*3/uL (ref 4.0–10.5)

## 2016-02-24 LAB — I-STAT TROPONIN, ED: Troponin i, poc: 0.03 ng/mL (ref 0.00–0.08)

## 2016-02-24 MED ORDER — IPRATROPIUM-ALBUTEROL 0.5-2.5 (3) MG/3ML IN SOLN
3.0000 mL | Freq: Once | RESPIRATORY_TRACT | Status: AC
  Administered 2016-02-24: 3 mL via RESPIRATORY_TRACT
  Filled 2016-02-24: qty 3

## 2016-02-24 MED ORDER — AZITHROMYCIN 250 MG PO TABS: 250.0000 mg | ORAL_TABLET | Freq: Every day | ORAL | 0 refills | Status: DC

## 2016-02-24 MED ORDER — BENZONATATE 100 MG PO CAPS: 100.0000 mg | ORAL_CAPSULE | Freq: Three times a day (TID) | ORAL | 0 refills | Status: DC | PRN

## 2016-02-24 NOTE — ED Notes (Signed)
Papers reviewed with patient along with medications. Verbalizes understanding

## 2016-02-24 NOTE — ED Provider Notes (Signed)
[facility] DEPT Provider Note   CSN: [id number] Arrival date & time: 02/24/16  1321     History   Chief Complaint Chief Complaint  Patient presents with  . Shortness of Breath  . Palpitations    HPI Brooke Stafford is a 60 y.o. female.  HPI  60 year old female presents with shortness of breath and palpitations. Has a history of prior CAD, renal disease, diabetes. Patient states that she's been having cough with yellow and white sputum. She has had bronchitis multiple times before but denies any known history of asthma or COPD. She is a former smoker. She was seen here 4 days ago for the same and told she probably has a viral respiratory infection. She has taken some cough drops which helped a little bit. She tried her albuterol inhaler but seem to make it worse. Today she came back because she thinks the heat in her house got to her causing shortness of breath. She had to go outside and the cold air and felt better. She has not no cine leg swelling. Has not had any chest pain over the last 3 days. Has not had any fevers.  Past Medical History:  Diagnosis Date  . Anemia   . Anginal pain ([place])   . Anxiety   . Arthritis    "stiff fingers and knees" (08/04/2013), (12/12/2014)  . Asthma   . CAD (coronary artery disease) 2002; 2015   CABG x 6 2002, cath 2011- med Rx stent DES VG-Diag  . CAD (coronary artery disease) of artery bypass graft; DES to VG-Diag 09/28/13 11/09/2013  . Cataract   . CHF (congestive heart failure) ([place])    "in 2002" (11/26/2012)  . Chronic bronchitis ([place])    "q year; in the winter"   . Chronic renal insufficiency, stage II (mild)    followed  by [facility]  . Coronary artery disease 2002   CABG x 6. Cath 5/11- med Rx  . GERD (gastroesophageal reflux disease)   . Gout    "right big toe"  . Headache    "~ q week" (08/04/2013); "~ twice/month" (12/12/2014)  . History of blood transfusion 2002   "when I had OHS"  . Hyperlipidemia   . Hypertension     . Hypothyroid    treated  . Migraines    "couple times/year" (08/04/2013), (12/12/2014)  . Myocardial infarction 2000; 2002; 2011  . Obesity (BMI 35.0-39.9 without comorbidity)   . Peripheral vascular disease ([place]) 12/12   LSFA PTA  . Pneumonia    "3 times I think" (12/12/2014)  . Type II diabetes mellitus [facility])     Patient Active Problem List   Diagnosis Date Noted  . Anemia of chronic disease 12/27/2015  . Acute on chronic systolic CHF (congestive heart failure) ([place])   . Acute CHF ([place]) 12/18/2015  . CHF exacerbation ([place]) 12/18/2015  . Constipation 12/18/2015  . Adjustment disorder with anxious mood 11/02/2015  . Posterior circulation stroke ([place]) 11/02/2015  . Abnormality of gait 10/23/2015  . Depression 09/28/2015  . Diabetes mellitus type 2 in obese ([place]) 09/13/2015  . Diabetic retinopathy ([place]) 09/13/2015  . HTN (hypertension), benign 08/24/2015  . Grief reaction 08/24/2015  . Pain in the chest   . Acute kidney injury superimposed on chronic kidney disease ([place]) 07/25/2015  . Creatinine elevation 07/25/2015  . Chest pain at rest 07/25/2015  . Chronic combined systolic and diastolic heart failure ([place]) 07/25/2015  . Rash of hands 02/23/2015  . Essential hypertension, benign  02/23/2015  . Essential hypertension   . Angina pectoris ([place]) 12/12/2014  . Abnormal nuclear stress test   . Acute combined systolic and diastolic heart failure ([place]) 12/02/2014  . Seasonal allergies 08/11/2014  . Gout of big toe 08/11/2014  . Encounter for screening mammogram for breast cancer 05/16/2014  . Screening for colon cancer 05/16/2014  . Diabetic neuropathy, type II diabetes mellitus ([place]) 01/27/2014  . Atopic eczema 01/27/2014  . Precordial pain, atypical, negative MI, Musculature Skeletal pain  11/08/2013  . CKD (chronic kidney disease), stage III 11/08/2013  . Unstable angina ([place]) 09/28/2013  . Ischemic cardiomyopathy- new drop in EF 08/30/2013  . Chest pain 08/04/2013  . CAD  -S/P PCI June 2015 and 12/14/14 02/01/2013  . Hypothyroidism 02/01/2013  . PVD, LSFA PTA 12/12 04/06/2011  . HTN (hypertension), malignant, patent renal arteries 04/06/2011  . Hx of CABG x 6 2002 04/06/2011  . Dyslipidemia 04/06/2011    Past Surgical History:  Procedure Laterality Date  . ABDOMINAL AORTAGRAM N/A 04/05/2011   Procedure: ABDOMINAL AORTAGRAM;  Surgeon: Lorretta Harp, MD;  Location: [facility] CATH LAB;  Service: Cardiovascular;  Laterality: N/A;  . APPENDECTOMY  1980  . BREAST CYST EXCISION Right 1970's  . CARDIAC CATHETERIZATION  2002  . CARDIAC CATHETERIZATION N/A 12/12/2014   Procedure: Left Heart Cath and Cors/Grafts Angiography;  Surgeon: Peter M Martinique, MD;  Location: [facility] CV LAB;  Service: Cardiovascular;  Laterality: N/A;  . CARDIAC CATHETERIZATION N/A 12/12/2014   Procedure: Coronary Stent Intervention;  Surgeon: Peter M Martinique, MD;  Location: [facility] CV LAB;  Service: Cardiovascular;  Laterality: N/A;  . [place]; 1980  . CHOLECYSTECTOMY  1982  . CORONARY ANGIOPLASTY WITH STENT PLACEMENT  2004; 2012   "I have 2 stents" (08/04/2013)  . CORONARY ANGIOPLASTY WITH STENT PLACEMENT  09/28/13   PTCA/ DES Xience stent to VG-Diag   . CORONARY ARTERY BYPASS GRAFT  11/20/2000   x6 LIMA to distal LAD, svg to first diag, svg to ramus intermediate branch and swquential SVG to cir marginal branch, SVG to posterior descending coronary and sequential SVG to first right posterolateral branch  . LEFT HEART CATHETERIZATION WITH CORONARY/GRAFT ANGIOGRAM N/A 09/28/2013   Procedure: LEFT HEART CATHETERIZATION WITH Beatrix Fetters;  Surgeon: Peter M Martinique, MD;  Location[facility] CATH LAB;  Service: Cardiovascular;  Laterality: N/A;  . LOWER EXTREMITY ANGIOGRAM  12/01/2012   Procedure: LOWER EXTREMITY ANGIOGRAM;  Surgeon: Lorretta Harp, MD;  Location: [facility] CATH LAB;  Service: Cardiovascular;;  . NM MYOCAR PERF WALL MOTION  08/27/2004   negative  . PERCUTANEOUS STENT  INTERVENTION Left 12/01/2012   Procedure: PERCUTANEOUS STENT INTERVENTION;  Surgeon: Lorretta Harp, MD;  Location: [facility] CATH LAB;  Service: Cardiovascular;  Laterality: Left;  Left SFA  . PERIPHERAL ARTERIAL STENT GRAFT Left    SFA/notes 04/07/2011 (11/30/2012)  . RENAL ANGIOGRAM N/A 04/05/2011   Procedure: RENAL ANGIOGRAM;  Surgeon: Lorretta Harp, MD;  Location: [facility] CATH LAB;  Service: Cardiovascular;  Laterality: N/A;  . TUBAL LIGATION  1980    OB History    No data available       Home Medications    Prior to Admission medications   Medication Sig Start Date End Date Taking? Authorizing Provider  acetaminophen-codeine (TYLENOL #3) 300-30 MG tablet Take 1 tablet by mouth every 4 (four) hours as needed for moderate pain. 01/11/16   Tresa Garter, MD  albuterol (PROVENTIL HFA;VENTOLIN HFA) 108 (90 Base) MCG/ACT inhaler  Inhale 2 puffs into the lungs every 6 (six) hours as needed for wheezing or shortness of breath. 09/28/15   Tresa Garter, MD  allopurinol (ZYLOPRIM) 300 MG tablet Take 1 tablet (300 mg total) by mouth daily. 02/22/16   Tresa Garter, MD  amLODipine (NORVASC) 10 MG tablet take 1 tablet by mouth once daily Patient taking differently: Take 10 mg by mouth daily 11/20/15   Sanda Klein, MD  aspirin 81 MG chewable tablet Chew 1 tablet (81 mg total) by mouth daily. 02/24/15   Mihai Croitoru, MD  atorvastatin (LIPITOR) 40 MG tablet Take 1 tablet (40 mg total) by mouth daily. 07/28/15   Ripudeep Krystal Eaton, MD  azithromycin (ZITHROMAX) 250 MG tablet Take 1 tablet (250 mg total) by mouth daily. Take first 2 tablets together, then 1 every day until finished. 02/24/16   Sherwood Gambler, MD  benzonatate (TESSALON) 100 MG capsule Take 1 capsule (100 mg total) by mouth 3 (three) times daily as needed for cough. 02/24/16   Sherwood Gambler, MD  clonazePAM (KLONOPIN) 0.5 MG tablet Take 0.5 tablets (0.25 mg total) by mouth 2 (two) times daily as needed for anxiety. 02/22/16 03/23/16   Tresa Garter, MD  cloNIDine (CATAPRES) 0.2 MG tablet Take 1 tablet (0.2 mg total) by mouth 3 (three) times daily. 02/22/16   Tresa Garter, MD  clopidogrel (PLAVIX) 75 MG tablet Take 1 tablet (75 mg total) by mouth daily. 09/25/15   Mihai Croitoru, MD  ezetimibe (ZETIA) 10 MG tablet Take 1 tablet (10 mg total) by mouth daily. 01/11/16   Tresa Garter, MD  fluticasone (FLONASE) 50 MCG/ACT nasal spray Place 2 sprays into both nostrils daily as needed for allergies or rhinitis. 09/28/15   Tresa Garter, MD  furosemide (LASIX) 40 MG tablet Take 2 tablets (80 mg total) by mouth daily. 01/11/16   Tresa Garter, MD  gabapentin (NEURONTIN) 400 MG capsule take 1 capsule by mouth three times a day 01/24/16   Tresa Garter, MD  insulin aspart (NOVOLOG FLEXPEN) 100 UNIT/ML FlexPen Inject 10-30 units into the skin 3 times daily, plus sliding scale as instructed. 12/14/15   Philemon Kingdom, MD  Insulin Glargine (TOUJEO SOLOSTAR) 300 UNIT/ML SOPN Inject 40 Units into the skin at bedtime. 09/13/15   Philemon Kingdom, MD  isosorbide mononitrate (IMDUR) 60 MG 24 hr tablet Take 2 tablets (120 mg total) by mouth daily. 02/22/16   Tresa Garter, MD  levothyroxine (SYNTHROID, LEVOTHROID) 25 MCG tablet take 1 tablet by mouth every morning BEFORE BREAKFAST Patient taking differently: Take 25 mcg by mouth every morning 11/24/15   Tresa Garter, MD  loratadine (CLARITIN) 10 MG tablet Take 1 tablet (10 mg total) by mouth daily as needed for allergies. 12/15/14   Erlene Quan, PA-C  magnesium citrate SOLN Take 296 mLs (1 Bottle total) by mouth as needed for severe constipation. 07/28/15   Ripudeep Krystal Eaton, MD  metoprolol succinate (TOPROL-XL) 50 MG 24 hr tablet Take 3 tablets (150 mg total) by mouth daily. Take with or immediately following a meal. 07/28/15   Ripudeep Krystal Eaton, MD  Naphazoline HCl (CLEAR EYES OP) Place 1 drop into both eyes as needed (for dry eyes).    Historical Provider, MD    nitroGLYCERIN (NITROSTAT) 0.4 MG SL tablet Place 1 tablet (0.4 mg total) under the tongue every 5 (five) minutes as needed. For chest 11/10/14   Tresa Garter, MD  ranitidine (ZANTAC 75) 75 MG tablet  Take 1 tablet (75 mg total) by mouth 2 (two) times daily. Patient taking differently: Take 75 mg by mouth 2 (two) times daily as needed for heartburn.  07/28/15   Ripudeep Krystal Eaton, MD  senna-docusate (SENOKOT-S) 8.6-50 MG tablet Take 1 tablet by mouth at bedtime as needed for mild constipation. 12/23/15   Virgel Manifold, MD  traZODone (DESYREL) 50 MG tablet Take 1 tablet (50 mg total) by mouth at bedtime. Patient taking differently: Take 50 mg by mouth at bedtime as needed for sleep.  09/28/15   Tresa Garter, MD  triamcinolone ointment (KENALOG) 0.5 % Apply 1 application topically 2 (two) times daily. Patient taking differently: Apply 1 application topically daily as needed (for skin).  08/24/15   Tresa Garter, MD    Family History Family History  Problem Relation Age of Onset  . Diabetes Mother   . Hypertension Mother   . Stroke Mother   . Hypertension Father   . Hypertension Brother   . Hypertension Sister   . Diabetes Sister   . Hyperlipidemia Sister     Social History Social History  Substance Use Topics  . Smoking status: Former Smoker    Packs/day: 0.00    Years: 25.00    Types: Cigarettes    Quit date: 04/04/2000  . Smokeless tobacco: Never Used  . Alcohol use No     Comment: 12/12/2014  "have a glass of red wine on my birthday q yr; that's it"     Allergies   Digoxin and related; Hydralazine; Lisinopril; Penicillins cross reactors; and Adhesive [tape]   Review of Systems Review of Systems  Constitutional: Negative for fever.  HENT: Positive for congestion.   Respiratory: Positive for cough and shortness of breath.   Cardiovascular: Negative for chest pain and leg swelling.  Gastrointestinal: Negative for vomiting.  All other systems reviewed and are  negative.    Physical Exam Updated Vital Signs BP 171/85   Pulse 71   Temp 97.5 F (36.4 C) (Oral)   Resp 17   LMP 10/13/2014 (Exact Date)   SpO2 97%   Physical Exam  Constitutional: She is oriented to person, place, and time. She appears well-developed and well-nourished.  HENT:  Head: Normocephalic and atraumatic.  Right Ear: External ear normal.  Left Ear: External ear normal.  Nose: Nose normal.  Eyes: Right eye exhibits no discharge. Left eye exhibits no discharge.  Cardiovascular: Normal rate, regular rhythm and normal heart sounds.   Pulmonary/Chest: Effort normal. She has no wheezes. She has rales. She exhibits tenderness (right anterior chest).  Slight rales at bases  Abdominal: Soft. She exhibits no distension. There is no tenderness.  Musculoskeletal: She exhibits no edema.  Neurological: She is alert and oriented to person, place, and time.  Skin: Skin is warm and dry.  Nursing note and vitals reviewed.    ED Treatments / Results  Labs (all labs ordered are listed, but only abnormal results are displayed) Labs Reviewed  BASIC METABOLIC PANEL - Abnormal; Notable for the following:       Result Value   CO2 21 (*)    Glucose, Bld 182 (*)    BUN 30 (*)    Creatinine, Ser 3.04 (*)    GFR calc non Af Amer 16 (*)    GFR calc Af Amer 18 (*)    All other components within normal limits  CBC  I-STAT TROPOININ, ED    EKG  EKG Interpretation  Date/Time:  Saturday February 24 2016 13:30:39 EST Ventricular Rate:  80 PR Interval:  174 QRS Duration: 98 QT Interval:  418 QTC Calculation: 482 R Axis:   32 Text Interpretation:  Sinus rhythm with occasional Premature ventricular complexes Possible Left atrial enlargement T wave abnormality, consider lateral ischemia Prolonged QT Abnormal ECG no significant change since Nov 7 or Sept 5 2017 Confirmed by Regenia Skeeter MD, [place] [phone]) on 02/24/2016 3:02:21 PM       Radiology Dg Chest 2 View  Result Date:  02/24/2016 CLINICAL DATA:  Shortness of breath and cough. EXAM: CHEST  2 VIEW COMPARISON:  February 20, 2016 FINDINGS: Stable cardiomegaly. The hila and mediastinum are unchanged. No pneumothorax. Mild left perihilar scar or atelectasis persists. Minimal bibasilar atelectasis with no suspicious infiltrate. IMPRESSION: No active cardiopulmonary disease. Electronically Signed   By: Dorise Bullion III M.D   On: 02/24/2016 13:59    Procedures Procedures (including critical care time)  Medications Ordered in ED Medications  ipratropium-albuterol (DUONEB) 0.5-2.5 (3) MG/3ML nebulizer solution 3 mL (3 mLs Nebulization Given 02/24/16 1527)     Initial Impression / Assessment and Plan / ED Course  I have reviewed the triage vital signs and the nursing notes.  Pertinent labs & imaging results that were available during my care of the patient were reviewed by me and considered in my medical decision making (see chart for details).  Clinical Course as of Feb 23 1633  Sat Feb 24, 2016  1513 Patient would like to try a breathing treatment. Probably a bronchitis causing her symptoms. Currently will monitor and give albuterol. No distress. No CP. Doubt ACS, PE, dissection.  [SG]  1632 Feels better after duoneb. Is hypertensive but pulse ox, HR benign. Will treat with azithromycin for bronchitis. F/u with PCP, may need outpatient lung testing as she seems to get "bronchitis" every year in the winter months.  [SG]    Clinical Course User Index [SG] Sherwood Gambler, MD    Patient appears to have an infection causing her symptoms. Her creatinine is elevated but at baseline. Troponin is negative and with no chest pain for several days and a history that is more consistent with acute respiratory infection I think further troponin testing is unnecessary at this time. Unclear why she's having the palpitations but no signs of arrhythmia here on telemetry. Follow-up with PCP.  Final Clinical Impressions(s) / ED  Diagnoses   Final diagnoses:  Acute bronchitis, unspecified organism    New Prescriptions New Prescriptions   AZITHROMYCIN (ZITHROMAX) 250 MG TABLET    Take 1 tablet (250 mg total) by mouth daily. Take first 2 tablets together, then 1 every day until finished.   BENZONATATE (TESSALON) 100 MG CAPSULE    Take 1 capsule (100 mg total) by mouth 3 (three) times daily as needed for cough.     Sherwood Gambler, MD 02/24/16 [phone]

## 2016-02-24 NOTE — ED Triage Notes (Signed)
Patient complains of ongoing shortness of breath and palpitations since visit for same on Tuesday. Patient states that she was told she had viral illness but feels no better. NAD.

## 2016-02-27 ENCOUNTER — Other Ambulatory Visit: Payer: Self-pay

## 2016-02-27 NOTE — Patient Outreach (Signed)
[place] [facility]) Care Management  02/27/2016  Brooke Stafford 05-19-1955 [id number]  REFERRAL DATE; 02/26/16 REFERRAL SOURCE; ED census referral REFERRAL REASON; 4 or more ED visits in the past 6 months.   Telephone call to patient regarding ED census referral. Unable to reach patient. HIPAA compliant voice message left with call back phone number.   PLAN; RNCm will attempt 2nd telephone outreach within  1 week.  Quinn Plowman RN,BSN,CCM [facility] Telephonic  [phone]

## 2016-03-05 ENCOUNTER — Other Ambulatory Visit: Payer: Self-pay

## 2016-03-05 NOTE — Patient Outreach (Signed)
[facility]) Care Management  03/05/2016  TUWANNA KRAUSZ 12-Feb-1956 [id number]   REFERRAL DATE; 02/26/16 REFERRAL SOURCE; ED census referral REFERRAL REASON; 4 or more ED visits in the past 6 months.   Second telephone call to patient regarding ED census referral. Unable to reach patient. HIPAA compliant voice message left with call back phone number.   PLAN:  RNCM will attempt 3rd telephone call to patient within 1 week.   Quinn Plowman RN,BSN,CCM [facility] Telephonic  [phone]

## 2016-03-06 ENCOUNTER — Ambulatory Visit: Payer: Self-pay

## 2016-03-08 ENCOUNTER — Other Ambulatory Visit: Payer: Self-pay | Admitting: Cardiovascular Disease

## 2016-03-08 ENCOUNTER — Other Ambulatory Visit: Payer: Self-pay | Admitting: Physician Assistant

## 2016-03-08 MED ORDER — METOPROLOL SUCCINATE ER 50 MG PO TB24: 150.0000 mg | ORAL_TABLET | Freq: Every day | ORAL | 5 refills | Status: DC

## 2016-03-08 NOTE — Telephone Encounter (Signed)
Pt called requesting refill on metoprolol. Sent it in.  Lenoard Aden 03/08/2016 3:37 PM Beeper [phone]

## 2016-03-11 ENCOUNTER — Other Ambulatory Visit: Payer: Self-pay

## 2016-03-11 NOTE — Patient Outreach (Signed)
[place] [facility]) Care Management  03/11/2016  Brooke Stafford 13-Dec-1955 [id number]  REFERRAL DATE; 02/26/16 REFERRAL SOURCE; ED census referral REFERRAL REASON; 4 or more ED visits in the past 6 months.   Telephone attempt #3 Telephone call to patient regarding ED census referral. Unable to reach patient.  HIPAA compliant voice message left with call back phone number .  PLAN; RNCM will send patient outreach letter to attempt contact.   Quinn Plowman RN,BSN,CCM [facility] Telephonic  [phone]
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
## 2016-03-12 ENCOUNTER — Telehealth: Payer: Self-pay | Admitting: Internal Medicine

## 2016-03-12 NOTE — Telephone Encounter (Signed)
Patient called and is needing a prescription for bronchitis. Patient was seen by doctor on 11/9. She was also seen in the ED for Bronchitis

## 2016-03-12 NOTE — Telephone Encounter (Signed)
Are you able to provide this? Does patient need an additional appointment?

## 2016-03-14 ENCOUNTER — Telehealth: Payer: Self-pay | Admitting: Internal Medicine

## 2016-03-14 ENCOUNTER — Other Ambulatory Visit: Payer: Self-pay

## 2016-03-14 DIAGNOSIS — E113412 Type 2 diabetes mellitus with severe nonproliferative diabetic retinopathy with macular edema, left eye: Secondary | ICD-10-CM | POA: Diagnosis not present

## 2016-03-14 DIAGNOSIS — E113411 Type 2 diabetes mellitus with severe nonproliferative diabetic retinopathy with macular edema, right eye: Secondary | ICD-10-CM | POA: Diagnosis not present

## 2016-03-14 DIAGNOSIS — H3561 Retinal hemorrhage, right eye: Secondary | ICD-10-CM | POA: Diagnosis not present

## 2016-03-14 DIAGNOSIS — H359 Unspecified retinal disorder: Secondary | ICD-10-CM | POA: Diagnosis not present

## 2016-03-14 NOTE — Patient Outreach (Addendum)
[place] [facility]) Care Management  03/14/2016  Brooke Stafford Aug 20, 1955 [id number]  SUBJECTIVE; Telephone call to patient for follow up. HIPAA verified with patient. Patient states she has attempted to get appointment scheduled with her primary doctor. States she had to leave a message with the office. Patient states she has not heard back from the office to schedule.  Patient states she went to [facility] apartments to look into housing. Patient states there were no vacancies.  Patient states she has contacted all of the apartment facilities given to her by Dietrich Pates, Education officer, museum. RNCM gave patient number to [facility]- 211 resource to call to see if she can receive further assistance with housing.  RNCM contacted patients primary MD office and spoke with scheduling person, [place].  States appointments for upcoming week will be open to schedule on Monday 03/18/16.  Recommends patient call back on Monday to schedule.  Also states note has been sent to the nurse of Dr.Jegedee to call patient and discuss her concerns/ symptoms.  RNCM attempted to call patient back to give information regarding primary MD appointment. Unable to reach. HIPAA compliant voice message left with call back phone number.   PLAN; RNCM will await call back from patient or attempt call within 1 week. RNCM will ask Dietrich Pates, [facility] social worker for any additional housing resources for patient.  Quinn Plowman RN,BSN,CCM [facility] Telephonic  [phone]

## 2016-03-14 NOTE — Telephone Encounter (Signed)
Please advise on medication or OV?

## 2016-03-14 NOTE — Telephone Encounter (Signed)
Pt calling requesting a Rx for Bronchitis  Pt states she went to the ED a few weeks ago and was diagnosed and given antibiotics for Bronchitis  Pt states antibiotics was for 5 days. Pt finished pills and is still experiencing the same symptoms  Pt was instructed by ED to call PCP if symptoms continued after antibiotics  Pt requesting to speak to nurse about obtaining medication or for recommendations on over-the-counter medications

## 2016-03-15 ENCOUNTER — Encounter: Payer: Self-pay | Admitting: Internal Medicine

## 2016-03-15 ENCOUNTER — Ambulatory Visit (INDEPENDENT_AMBULATORY_CARE_PROVIDER_SITE_OTHER): Payer: Medicare Other | Admitting: Internal Medicine

## 2016-03-15 ENCOUNTER — Encounter (HOSPITAL_COMMUNITY): Payer: Medicare Other

## 2016-03-15 ENCOUNTER — Other Ambulatory Visit (HOSPITAL_COMMUNITY): Payer: Medicare Other

## 2016-03-15 ENCOUNTER — Encounter: Payer: Medicare Other | Admitting: Vascular Surgery

## 2016-03-15 VITALS — BP 160/80 | HR 76 | Wt 238.0 lb

## 2016-03-15 DIAGNOSIS — Z794 Long term (current) use of insulin: Secondary | ICD-10-CM

## 2016-03-15 DIAGNOSIS — I200 Unstable angina: Secondary | ICD-10-CM

## 2016-03-15 DIAGNOSIS — E1159 Type 2 diabetes mellitus with other circulatory complications: Secondary | ICD-10-CM | POA: Diagnosis not present

## 2016-03-15 LAB — POCT GLYCOSYLATED HEMOGLOBIN (HGB A1C): HEMOGLOBIN A1C: 5.9

## 2016-03-15 MED ORDER — INSULIN GLARGINE 300 UNIT/ML ~~LOC~~ SOPN: 35.0000 [IU] | PEN_INJECTOR | Freq: Every day | SUBCUTANEOUS | 5 refills | Status: DC

## 2016-03-15 NOTE — Patient Instructions (Addendum)
Please decrease: - Toujeo to 35 units daily  Please continue - Novolog (10-15 min before a meal): 10-15 units before b'fast If sugars before a meal are 80-150: take the whole amount of mealtime insulin with that meal, but not the Sliding scale If sugars are 60-80, take only half of the mealtime insulin and no sliding scale. If sugars <60, do not take any insulin with that meal. - NovoLog SSI: - 150- 165: + 1 unit  - 166- 180: + 2 units  - 181- 195: + 3 units  - 196- 210: + 4 units  - >210: + 5 units  Please return in 3 months with your sugar log.
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
## 2016-03-15 NOTE — Telephone Encounter (Signed)
May be no bronchitis, needs an office visit for proper evaluation

## 2016-03-18 ENCOUNTER — Other Ambulatory Visit: Payer: Self-pay

## 2016-03-18 NOTE — Patient Outreach (Signed)
[facility]) Care Management  03/18/2016  Brooke Stafford [birth year] [id number]  Received return call from patient. HIPAA verified with patient. RNCM notified patient that her primary MD office was contacted and informed that primary office stated appointment scheduled would be open today.  Patient can now call and scheduled follow up appointment.  Informed patient that Dietrich Pates, Education officer, museum was contacted and stated she had no  additional resources regarding housing.  Social advised that patient used website resource that was provided for housing needs.  Patient verbalized understanding.  Patient states she has no further needs an did not feel she needed [facility] care management services at this time.  Patient states she has contact phone number for [facility] care management in the event she needs services.    PLAN: RNCM will refer patient to care management assistant to close due to refusal of services. RNCM will notify patients primary MD of closure.  Brooke Plowman RN,BSN,CCM [facility] Telephonic  [phone]

## 2016-03-18 NOTE — Patient Outreach (Signed)
[facility]) Care Management  03/18/2016  Brooke Stafford 11/26/1955 [id number]  Telephone call to patient for care coordination follow up.  Unable to reach patient. HIPAA compliant voice message left with call back phone number.   PLAN:  RNCM will attempt 2nd telephone call to patient within 1 week.   Quinn Plowman RN,BSN,CCM [facility] Telephonic  [phone]

## 2016-03-18 NOTE — Telephone Encounter (Signed)
Please schedule patient for an OV per Dr. Doreene Burke

## 2016-03-19 ENCOUNTER — Encounter (HOSPITAL_COMMUNITY)
Admission: RE | Admit: 2016-03-19 | Discharge: 2016-03-19 | Disposition: A | Discharge status: Home / Self Care | Payer: Medicare Other | Source: Ambulatory Visit | Attending: Nephrology | Admitting: Nephrology

## 2016-03-19 DIAGNOSIS — N183 Chronic kidney disease, stage 3 unspecified: Secondary | ICD-10-CM

## 2016-03-19 DIAGNOSIS — N184 Chronic kidney disease, stage 4 (severe): Secondary | ICD-10-CM | POA: Insufficient documentation

## 2016-03-19 DIAGNOSIS — Z5181 Encounter for therapeutic drug level monitoring: Secondary | ICD-10-CM | POA: Insufficient documentation

## 2016-03-19 DIAGNOSIS — D631 Anemia in chronic kidney disease: Secondary | ICD-10-CM | POA: Diagnosis not present

## 2016-03-19 DIAGNOSIS — Z79899 Other long term (current) drug therapy: Secondary | ICD-10-CM | POA: Insufficient documentation

## 2016-03-19 LAB — IRON AND TIBC
IRON: 51 ug/dL (ref 28–170)
Saturation Ratios: 20 % (ref 10.4–31.8)
TIBC: 259 ug/dL (ref 250–450)
UIBC: 208 ug/dL

## 2016-03-19 LAB — FERRITIN: Ferritin: 91 ng/mL (ref 11–307)

## 2016-03-19 LAB — POCT HEMOGLOBIN-HEMACUE: HEMOGLOBIN: 9.6 g/dL — AB (ref 12.0–15.0)

## 2016-03-19 MED ORDER — DARBEPOETIN ALFA 100 MCG/0.5ML IJ SOSY
100.0000 ug | PREFILLED_SYRINGE | INTRAMUSCULAR | Status: DC
  Administered 2016-03-19: 100 ug via SUBCUTANEOUS

## 2016-03-19 MED ORDER — DARBEPOETIN ALFA 100 MCG/0.5ML IJ SOSY
PREFILLED_SYRINGE | INTRAMUSCULAR | Status: AC
  Filled 2016-03-19: qty 0.5

## 2016-03-20 ENCOUNTER — Emergency Department (HOSPITAL_COMMUNITY)
Admission: EM | Admit: 2016-03-20 | Discharge: 2016-03-20 | Disposition: A | Discharge status: Home / Self Care | Payer: Medicare Other | Attending: Emergency Medicine | Admitting: Emergency Medicine

## 2016-03-20 ENCOUNTER — Emergency Department (HOSPITAL_COMMUNITY): Payer: Medicare Other

## 2016-03-20 ENCOUNTER — Encounter: Payer: Self-pay | Admitting: Vascular Surgery

## 2016-03-20 ENCOUNTER — Encounter (HOSPITAL_COMMUNITY): Payer: Self-pay | Admitting: Emergency Medicine

## 2016-03-20 DIAGNOSIS — I130 Hypertensive heart and chronic kidney disease with heart failure and stage 1 through stage 4 chronic kidney disease, or unspecified chronic kidney disease: Secondary | ICD-10-CM | POA: Insufficient documentation

## 2016-03-20 DIAGNOSIS — E1122 Type 2 diabetes mellitus with diabetic chronic kidney disease: Secondary | ICD-10-CM | POA: Insufficient documentation

## 2016-03-20 DIAGNOSIS — Z7902 Long term (current) use of antithrombotics/antiplatelets: Secondary | ICD-10-CM | POA: Diagnosis not present

## 2016-03-20 DIAGNOSIS — Z951 Presence of aortocoronary bypass graft: Secondary | ICD-10-CM | POA: Diagnosis not present

## 2016-03-20 DIAGNOSIS — Z7982 Long term (current) use of aspirin: Secondary | ICD-10-CM | POA: Diagnosis not present

## 2016-03-20 DIAGNOSIS — M7989 Other specified soft tissue disorders: Secondary | ICD-10-CM | POA: Diagnosis present

## 2016-03-20 DIAGNOSIS — Z79899 Other long term (current) drug therapy: Secondary | ICD-10-CM | POA: Diagnosis not present

## 2016-03-20 DIAGNOSIS — Z794 Long term (current) use of insulin: Secondary | ICD-10-CM | POA: Diagnosis not present

## 2016-03-20 DIAGNOSIS — E039 Hypothyroidism, unspecified: Secondary | ICD-10-CM | POA: Diagnosis not present

## 2016-03-20 DIAGNOSIS — I129 Hypertensive chronic kidney disease with stage 1 through stage 4 chronic kidney disease, or unspecified chronic kidney disease: Secondary | ICD-10-CM | POA: Diagnosis not present

## 2016-03-20 DIAGNOSIS — I5042 Chronic combined systolic (congestive) and diastolic (congestive) heart failure: Secondary | ICD-10-CM

## 2016-03-20 DIAGNOSIS — I2510 Atherosclerotic heart disease of native coronary artery without angina pectoris: Secondary | ICD-10-CM | POA: Diagnosis not present

## 2016-03-20 DIAGNOSIS — I5043 Acute on chronic combined systolic (congestive) and diastolic (congestive) heart failure: Secondary | ICD-10-CM | POA: Insufficient documentation

## 2016-03-20 DIAGNOSIS — R1900 Intra-abdominal and pelvic swelling, mass and lump, unspecified site: Secondary | ICD-10-CM | POA: Diagnosis not present

## 2016-03-20 DIAGNOSIS — R609 Edema, unspecified: Secondary | ICD-10-CM

## 2016-03-20 DIAGNOSIS — Z87891 Personal history of nicotine dependence: Secondary | ICD-10-CM | POA: Diagnosis not present

## 2016-03-20 DIAGNOSIS — J45909 Unspecified asthma, uncomplicated: Secondary | ICD-10-CM | POA: Diagnosis not present

## 2016-03-20 DIAGNOSIS — R600 Localized edema: Secondary | ICD-10-CM | POA: Insufficient documentation

## 2016-03-20 DIAGNOSIS — N184 Chronic kidney disease, stage 4 (severe): Secondary | ICD-10-CM | POA: Diagnosis not present

## 2016-03-20 LAB — COMPREHENSIVE METABOLIC PANEL
ALK PHOS: 141 U/L — AB (ref 38–126)
ALT: 39 U/L (ref 14–54)
ANION GAP: 9 (ref 5–15)
AST: 37 U/L (ref 15–41)
Albumin: 3.5 g/dL (ref 3.5–5.0)
BUN: 38 mg/dL — ABNORMAL HIGH (ref 6–20)
CALCIUM: 8.9 mg/dL (ref 8.9–10.3)
CHLORIDE: 105 mmol/L (ref 101–111)
CO2: 26 mmol/L (ref 22–32)
Creatinine, Ser: 3.3 mg/dL — ABNORMAL HIGH (ref 0.44–1.00)
GFR, EST AFRICAN AMERICAN: 16 mL/min — AB (ref >60)
GFR, EST NON AFRICAN AMERICAN: 14 mL/min — AB (ref >60)
Glucose, Bld: 117 mg/dL — ABNORMAL HIGH (ref 65–99)
Potassium: 3.8 mmol/L (ref 3.5–5.1)
SODIUM: 140 mmol/L (ref 135–145)
Total Bilirubin: 0.4 mg/dL (ref 0.3–1.2)
Total Protein: 7 g/dL (ref 6.5–8.1)

## 2016-03-20 LAB — CBC
HCT: 28.1 % — ABNORMAL LOW (ref 36.0–46.0)
Hemoglobin: 9.5 g/dL — ABNORMAL LOW (ref 12.0–15.0)
MCH: 31.8 pg (ref 26.0–34.0)
MCHC: 33.8 g/dL (ref 30.0–36.0)
MCV: 94 fL (ref 78.0–100.0)
Platelets: 237 10*3/uL (ref 150–400)
RBC: 2.99 MIL/uL — ABNORMAL LOW (ref 3.87–5.11)
RDW: 14.5 % (ref 11.5–15.5)
WBC: 6.5 10*3/uL (ref 4.0–10.5)

## 2016-03-20 LAB — BRAIN NATRIURETIC PEPTIDE: B NATRIURETIC PEPTIDE 5: 1247 pg/mL — AB (ref 0.0–100.0)

## 2016-03-20 LAB — TROPONIN I: TROPONIN I: 0.03 ng/mL — AB (ref <0.03)

## 2016-03-20 MED ORDER — FUROSEMIDE 10 MG/ML IJ SOLN
40.0000 mg | Freq: Once | INTRAMUSCULAR | Status: AC
  Administered 2016-03-20: 40 mg via INTRAVENOUS
  Filled 2016-03-20: qty 4

## 2016-03-20 MED ORDER — FUROSEMIDE 20 MG PO TABS: 20.0000 mg | ORAL_TABLET | Freq: Every day | ORAL | 0 refills | Status: DC

## 2016-03-20 NOTE — ED Notes (Signed)
RN to get labs

## 2016-03-20 NOTE — ED Provider Notes (Signed)
[facility] DEPT Provider Note   CSN: [id number] Arrival date & time: 03/20/16  1446     History   Chief Complaint Chief Complaint  Patient presents with  . Leg Swelling    HPI Brooke Stafford is a 60 y.o. female.  Pt presents to the ED today with bilateral LE swelling.  The pt said that she was seen in the ED on 11/11 for a cough.  Her cough is better, but now she has the leg swelling.  The pt has an appt with her doctor tomorrow morning.        Past Medical History:  Diagnosis Date  . Anemia   . Anginal pain ([place])   . Anxiety   . Arthritis    "stiff fingers and knees" (08/04/2013), (12/12/2014)  . Asthma   . CAD (coronary artery disease) 2002; 2015   CABG x 6 2002, cath 2011- med Rx stent DES VG-Diag  . CAD (coronary artery disease) of artery bypass graft; DES to VG-Diag 09/28/13 11/09/2013  . Cataract   . CHF (congestive heart failure) ([place])    "in 2002" (11/26/2012)  . Chronic bronchitis ([place])    "q year; in the winter"   . Chronic renal insufficiency, stage II (mild)    followed  by [facility]  . Coronary artery disease 2002   CABG x 6. Cath 5/11- med Rx  . GERD (gastroesophageal reflux disease)   . Gout    "right big toe"  . Headache    "~ q week" (08/04/2013); "~ twice/month" (12/12/2014)  . History of blood transfusion 2002   "when I had OHS"  . Hyperlipidemia   . Hypertension   . Hypothyroid    treated  . Migraines    "couple times/year" (08/04/2013), (12/12/2014)  . Myocardial infarction 2000; 2002; 2011  . Obesity (BMI 35.0-39.9 without comorbidity)   . Peripheral vascular disease ([place]) 12/12   LSFA PTA  . Pneumonia    "3 times I think" (12/12/2014)  . Type II diabetes mellitus [facility])     Patient Active Problem List   Diagnosis Date Noted  . Anemia of chronic disease 12/27/2015  . Acute on chronic systolic CHF (congestive heart failure) ([place])   . Acute CHF ([place]) 12/18/2015  . CHF exacerbation ([place]) 12/18/2015  . Constipation 12/18/2015    . Adjustment disorder with anxious mood 11/02/2015  . Posterior circulation stroke ([place]) 11/02/2015  . Abnormality of gait 10/23/2015  . Depression 09/28/2015  . Diabetes mellitus with circulatory complication, with long-term current use of insulin ([place]) 09/13/2015  . Diabetic retinopathy ([place]) 09/13/2015  . HTN (hypertension), benign 08/24/2015  . Grief reaction 08/24/2015  . Pain in the chest   . Acute kidney injury superimposed on chronic kidney disease ([place]) 07/25/2015  . Creatinine elevation 07/25/2015  . Chest pain at rest 07/25/2015  . Chronic combined systolic and diastolic heart failure ([place]) 07/25/2015  . Rash of hands 02/23/2015  . Essential hypertension, benign 02/23/2015  . Essential hypertension   . Angina pectoris ([place]) 12/12/2014  . Abnormal nuclear stress test   . Acute combined systolic and diastolic heart failure ([place]) 12/02/2014  . Seasonal allergies 08/11/2014  . Gout of big toe 08/11/2014  . Encounter for screening mammogram for breast cancer 05/16/2014  . Screening for colon cancer 05/16/2014  . Diabetic neuropathy, type II diabetes mellitus ([place]) 01/27/2014  . Atopic eczema 01/27/2014  . Precordial pain, atypical, negative MI, Musculature Skeletal pain  11/08/2013  . CKD (chronic kidney disease),  stage III 11/08/2013  . Unstable angina ([place]) 09/28/2013  . Ischemic cardiomyopathy- new drop in EF 08/30/2013  . Chest pain 08/04/2013  . CAD -S/P PCI June 2015 and 12/14/14 02/01/2013  . Hypothyroidism 02/01/2013  . PVD, LSFA PTA 12/12 04/06/2011  . HTN (hypertension), malignant, patent renal arteries 04/06/2011  . Hx of CABG x 6 2002 04/06/2011  . Dyslipidemia 04/06/2011    Past Surgical History:  Procedure Laterality Date  . ABDOMINAL AORTAGRAM N/A 04/05/2011   Procedure: ABDOMINAL AORTAGRAM;  Surgeon: Lorretta Harp, MD;  Location: [facility] CATH LAB;  Service: Cardiovascular;  Laterality: N/A;  . APPENDECTOMY  1980  . BREAST CYST EXCISION Right 1970's  .  CARDIAC CATHETERIZATION  2002  . CARDIAC CATHETERIZATION N/A 12/12/2014   Procedure: Left Heart Cath and Cors/Grafts Angiography;  Surgeon: Peter M Martinique, MD;  Location: [facility] CV LAB;  Service: Cardiovascular;  Laterality: N/A;  . CARDIAC CATHETERIZATION N/A 12/12/2014   Procedure: Coronary Stent Intervention;  Surgeon: Peter M Martinique, MD;  Location: [facility] CV LAB;  Service: Cardiovascular;  Laterality: N/A;  . [place]; 1980  . CHOLECYSTECTOMY  1982  . CORONARY ANGIOPLASTY WITH STENT PLACEMENT  2004; 2012   "I have 2 stents" (08/04/2013)  . CORONARY ANGIOPLASTY WITH STENT PLACEMENT  09/28/13   PTCA/ DES Xience stent to VG-Diag   . CORONARY ARTERY BYPASS GRAFT  11/20/2000   x6 LIMA to distal LAD, svg to first diag, svg to ramus intermediate branch and swquential SVG to cir marginal branch, SVG to posterior descending coronary and sequential SVG to first right posterolateral branch  . LEFT HEART CATHETERIZATION WITH CORONARY/GRAFT ANGIOGRAM N/A 09/28/2013   Procedure: LEFT HEART CATHETERIZATION WITH Beatrix Fetters;  Surgeon: Peter M Martinique, MD;  Location: [facility] CATH LAB;  Service: Cardiovascular;  Laterality: N/A;  . LOWER EXTREMITY ANGIOGRAM  12/01/2012   Procedure: LOWER EXTREMITY ANGIOGRAM;  Surgeon: Lorretta Harp, MD;  Location: [facility] CATH LAB;  Service: Cardiovascular;;  . NM MYOCAR PERF WALL MOTION  08/27/2004   negative  . PERCUTANEOUS STENT INTERVENTION Left 12/01/2012   Procedure: PERCUTANEOUS STENT INTERVENTION;  Surgeon: Lorretta Harp, MD;  Location: [facility] CATH LAB;  Service: Cardiovascular;  Laterality: Left;  Left SFA  . PERIPHERAL ARTERIAL STENT GRAFT Left    SFA/notes 04/07/2011 (11/30/2012)  . RENAL ANGIOGRAM N/A 04/05/2011   Procedure: RENAL ANGIOGRAM;  Surgeon: Lorretta Harp, MD;  Location: [facility] CATH LAB;  Service: Cardiovascular;  Laterality: N/A;  . TUBAL LIGATION  1980    OB History    No data available       Home Medications    Prior to  Admission medications   Medication Sig Start Date End Date Taking? Authorizing Provider  acetaminophen-codeine (TYLENOL #3) 300-30 MG tablet Take 1 tablet by mouth every 4 (four) hours as needed for moderate pain. 01/11/16   Tresa Garter, MD  albuterol (PROVENTIL HFA;VENTOLIN HFA) 108 (90 Base) MCG/ACT inhaler Inhale 2 puffs into the lungs every 6 (six) hours as needed for wheezing or shortness of breath. 09/28/15   Tresa Garter, MD  allopurinol (ZYLOPRIM) 300 MG tablet Take 1 tablet (300 mg total) by mouth daily. 02/22/16   Tresa Garter, MD  amLODipine (NORVASC) 10 MG tablet take 1 tablet by mouth once daily Patient taking differently: Take 10 mg by mouth daily 11/20/15   Sanda Klein, MD  aspirin 81 MG chewable tablet Chew 1 tablet (81 mg total) by mouth daily. 02/24/15  Mihai Croitoru, MD  atorvastatin (LIPITOR) 40 MG tablet Take 1 tablet (40 mg total) by mouth daily. 07/28/15   Ripudeep Krystal Eaton, MD  azithromycin (ZITHROMAX) 250 MG tablet Take 1 tablet (250 mg total) by mouth daily. Take first 2 tablets together, then 1 every day until finished. 02/24/16   Sherwood Gambler, MD  benzonatate (TESSALON) 100 MG capsule Take 1 capsule (100 mg total) by mouth 3 (three) times daily as needed for cough. 02/24/16   Sherwood Gambler, MD  clonazePAM (KLONOPIN) 0.5 MG tablet Take 0.5 tablets (0.25 mg total) by mouth 2 (two) times daily as needed for anxiety. 02/22/16 03/23/16  Tresa Garter, MD  cloNIDine (CATAPRES) 0.2 MG tablet Take 1 tablet (0.2 mg total) by mouth 3 (three) times daily. 02/22/16   Tresa Garter, MD  clopidogrel (PLAVIX) 75 MG tablet Take 1 tablet (75 mg total) by mouth daily. 09/25/15   Mihai Croitoru, MD  ezetimibe (ZETIA) 10 MG tablet Take 1 tablet (10 mg total) by mouth daily. 01/11/16   Tresa Garter, MD  fluticasone (FLONASE) 50 MCG/ACT nasal spray Place 2 sprays into both nostrils daily as needed for allergies or rhinitis. 09/28/15   Tresa Garter, MD    furosemide (LASIX) 20 MG tablet Take 1 tablet (20 mg total) by mouth daily. Take a total of 60 mg in the morning and 40 mg at night. 03/20/16   Isla Pence, MD  gabapentin (NEURONTIN) 400 MG capsule take 1 capsule by mouth three times a day 01/24/16   Tresa Garter, MD  insulin aspart (NOVOLOG FLEXPEN) 100 UNIT/ML FlexPen Inject 10-30 units into the skin 3 times daily, plus sliding scale as instructed. 12/14/15   Philemon Kingdom, MD  Insulin Glargine (TOUJEO SOLOSTAR) 300 UNIT/ML SOPN Inject 35 Units into the skin at bedtime. 03/15/16   Philemon Kingdom, MD  isosorbide mononitrate (IMDUR) 60 MG 24 hr tablet Take 2 tablets (120 mg total) by mouth daily. 02/22/16   Tresa Garter, MD  levothyroxine (SYNTHROID, LEVOTHROID) 25 MCG tablet take 1 tablet by mouth every morning BEFORE BREAKFAST Patient taking differently: Take 25 mcg by mouth every morning 11/24/15   Tresa Garter, MD  loratadine (CLARITIN) 10 MG tablet Take 1 tablet (10 mg total) by mouth daily as needed for allergies. 12/15/14   Erlene Quan, PA-C  magnesium citrate SOLN Take 296 mLs (1 Bottle total) by mouth as needed for severe constipation. 07/28/15   Ripudeep Krystal Eaton, MD  metoprolol succinate (TOPROL-XL) 50 MG 24 hr tablet Take 3 tablets (150 mg total) by mouth daily. Take with or immediately following a meal. 03/08/16   Rhonda G Barrett, PA-C  Naphazoline HCl (CLEAR EYES OP) Place 1 drop into both eyes as needed (for dry eyes).    Historical Provider, MD  nitroGLYCERIN (NITROSTAT) 0.4 MG SL tablet Place 1 tablet (0.4 mg total) under the tongue every 5 (five) minutes as needed. For chest 11/10/14   Tresa Garter, MD  ranitidine (ZANTAC 75) 75 MG tablet Take 1 tablet (75 mg total) by mouth 2 (two) times daily. Patient taking differently: Take 75 mg by mouth 2 (two) times daily as needed for heartburn.  07/28/15   Ripudeep Krystal Eaton, MD  senna-docusate (SENOKOT-S) 8.6-50 MG tablet Take 1 tablet by mouth at bedtime as needed  for mild constipation. 12/23/15   Virgel Manifold, MD  traZODone (DESYREL) 50 MG tablet Take 1 tablet (50 mg total) by mouth at bedtime. Patient taking differently: Take  50 mg by mouth at bedtime as needed for sleep.  09/28/15   Tresa Garter, MD  triamcinolone ointment (KENALOG) 0.5 % Apply 1 application topically 2 (two) times daily. Patient taking differently: Apply 1 application topically daily as needed (for skin).  08/24/15   Tresa Garter, MD    Family History Family History  Problem Relation Age of Onset  . Diabetes Mother   . Hypertension Mother   . Stroke Mother   . Hypertension Father   . Hypertension Brother   . Hypertension Sister   . Diabetes Sister   . Hyperlipidemia Sister     Social History Social History  Substance Use Topics  . Smoking status: Former Smoker    Packs/day: 0.00    Years: 25.00    Types: Cigarettes    Quit date: 04/04/2000  . Smokeless tobacco: Never Used  . Alcohol use No     Comment: 12/12/2014  "have a glass of red wine on my birthday q yr; that's it"     Allergies   Digoxin and related; Hydralazine; Lisinopril; Penicillins cross reactors; and Adhesive [tape]   Review of Systems Review of Systems  Cardiovascular: Positive for leg swelling.  All other systems reviewed and are negative.    Physical Exam Updated Vital Signs BP 164/83   Pulse (!) 58   Temp 97.4 F (36.3 C) (Oral)   Resp 16   LMP 10/13/2014 (Exact Date)   SpO2 94%   Physical Exam  Constitutional: She is oriented to person, place, and time. She appears well-developed and well-nourished.  HENT:  Head: Normocephalic and atraumatic.  Right Ear: External ear normal.  Left Ear: External ear normal.  Nose: Nose normal.  Mouth/Throat: Oropharynx is clear and moist.  Eyes: Conjunctivae and EOM are normal. Pupils are equal, round, and reactive to light.  Neck: Normal range of motion. Neck supple.  Cardiovascular: Normal rate, regular rhythm, normal heart  sounds and intact distal pulses.   Pulmonary/Chest: Effort normal and breath sounds normal.  Abdominal: Soft. Bowel sounds are normal.  Musculoskeletal: She exhibits edema.  Neurological: She is alert and oriented to person, place, and time.  Skin: Skin is warm and dry.  Psychiatric: She has a normal mood and affect. Her behavior is normal. Thought content normal.  Nursing note and vitals reviewed.    ED Treatments / Results  Labs (all labs ordered are listed, but only abnormal results are displayed) Labs Reviewed  CBC - Abnormal; Notable for the following:       Result Value   RBC 2.99 (*)    Hemoglobin 9.5 (*)    HCT 28.1 (*)    All other components within normal limits  BRAIN NATRIURETIC PEPTIDE - Abnormal; Notable for the following:    B Natriuretic Peptide 1,247.0 (*)    All other components within normal limits  COMPREHENSIVE METABOLIC PANEL - Abnormal; Notable for the following:    Glucose, Bld 117 (*)    BUN 38 (*)    Creatinine, Ser 3.30 (*)    Alkaline Phosphatase 141 (*)    GFR calc non Af Amer 14 (*)    GFR calc Af Amer 16 (*)    All other components within normal limits  TROPONIN I - Abnormal; Notable for the following:    Troponin I 0.03 (*)    All other components within normal limits    EKG  EKG Interpretation None       Radiology Dg Abdomen Acute W/chest  Result Date: 03/20/2016 CLINICAL DATA:  Lower extremity edema 2 days ago with abdominal swelling. EXAM: DG ABDOMEN ACUTE W/ 1V CHEST COMPARISON:  CXR 02/24/2016, abdominal ultrasound from 12/18/2015 FINDINGS: Patient is status post CABG. There is stable cardiomegaly with aortic atherosclerosis. Mild left perihilar scarring. No pneumonic consolidation nor overt pulmonary edema. No pneumothorax nor effusion. Bowel gas pattern is unremarkable. No bowel obstruction or free air. No organomegaly nor suspicious calculi. Bi-common iliac artery atherosclerosis. IMPRESSION: Negative abdominal radiographs. Stable  cardiomegaly without acute pulmonary disease. Status post CABG. Electronically Signed   By: Ashley Royalty M.D.   On: 03/20/2016 18:55    Procedures Procedures (including critical care time)  Medications Ordered in ED Medications  furosemide (LASIX) injection 40 mg (40 mg Intravenous Given 03/20/16 1852)     Initial Impression / Assessment and Plan / ED Course  I have reviewed the triage vital signs and the nursing notes.  Pertinent labs & imaging results that were available during my care of the patient were reviewed by me and considered in my medical decision making (see chart for details).  Clinical Course    Pt with CRI.  I increased her lasix to 60 in the morning.  She has an appt with her doctor tomorrow and knows to keep that appt to see what her doctor wants to do about her lasix.  Final Clinical Impressions(s) / ED Diagnoses   Final diagnoses:  Peripheral edema  CRI (chronic renal insufficiency), stage 4 (severe) [facility])    New Prescriptions Current Discharge Medication List       Isla Pence, MD 03/20/16 1944

## 2016-03-20 NOTE — ED Notes (Signed)
Unable to collect labs unsuccessful

## 2016-03-20 NOTE — ED Triage Notes (Signed)
Per pt, states LE edema started 2 days ago-states abdominal swelling as well-states she has gained 6 LBs over past 2 days-no SOB

## 2016-03-20 NOTE — ED Notes (Signed)
Delay on EKG assisting with CODE in RES B

## 2016-03-21 ENCOUNTER — Encounter: Payer: Self-pay | Admitting: Internal Medicine

## 2016-03-21 ENCOUNTER — Ambulatory Visit: Payer: Medicare Other | Attending: Internal Medicine | Admitting: Internal Medicine

## 2016-03-21 VITALS — BP 165/79 | HR 61 | Temp 97.9°F | Resp 18 | Ht 73.0 in | Wt 245.2 lb

## 2016-03-21 DIAGNOSIS — F339 Major depressive disorder, recurrent, unspecified: Secondary | ICD-10-CM | POA: Insufficient documentation

## 2016-03-21 DIAGNOSIS — Z951 Presence of aortocoronary bypass graft: Secondary | ICD-10-CM | POA: Insufficient documentation

## 2016-03-21 DIAGNOSIS — E039 Hypothyroidism, unspecified: Secondary | ICD-10-CM | POA: Insufficient documentation

## 2016-03-21 DIAGNOSIS — I130 Hypertensive heart and chronic kidney disease with heart failure and stage 1 through stage 4 chronic kidney disease, or unspecified chronic kidney disease: Secondary | ICD-10-CM | POA: Insufficient documentation

## 2016-03-21 DIAGNOSIS — E669 Obesity, unspecified: Secondary | ICD-10-CM | POA: Diagnosis not present

## 2016-03-21 DIAGNOSIS — G43909 Migraine, unspecified, not intractable, without status migrainosus: Secondary | ICD-10-CM | POA: Insufficient documentation

## 2016-03-21 DIAGNOSIS — I10 Essential (primary) hypertension: Secondary | ICD-10-CM

## 2016-03-21 DIAGNOSIS — N184 Chronic kidney disease, stage 4 (severe): Secondary | ICD-10-CM | POA: Diagnosis not present

## 2016-03-21 DIAGNOSIS — I252 Old myocardial infarction: Secondary | ICD-10-CM | POA: Insufficient documentation

## 2016-03-21 DIAGNOSIS — J45909 Unspecified asthma, uncomplicated: Secondary | ICD-10-CM | POA: Diagnosis not present

## 2016-03-21 DIAGNOSIS — R05 Cough: Secondary | ICD-10-CM | POA: Insufficient documentation

## 2016-03-21 DIAGNOSIS — I25810 Atherosclerosis of coronary artery bypass graft(s) without angina pectoris: Secondary | ICD-10-CM | POA: Insufficient documentation

## 2016-03-21 DIAGNOSIS — Z79899 Other long term (current) drug therapy: Secondary | ICD-10-CM | POA: Diagnosis not present

## 2016-03-21 DIAGNOSIS — E785 Hyperlipidemia, unspecified: Secondary | ICD-10-CM | POA: Diagnosis not present

## 2016-03-21 DIAGNOSIS — Z7982 Long term (current) use of aspirin: Secondary | ICD-10-CM | POA: Insufficient documentation

## 2016-03-21 DIAGNOSIS — E1140 Type 2 diabetes mellitus with diabetic neuropathy, unspecified: Secondary | ICD-10-CM | POA: Diagnosis not present

## 2016-03-21 DIAGNOSIS — I200 Unstable angina: Secondary | ICD-10-CM | POA: Diagnosis not present

## 2016-03-21 DIAGNOSIS — Z955 Presence of coronary angioplasty implant and graft: Secondary | ICD-10-CM | POA: Diagnosis not present

## 2016-03-21 DIAGNOSIS — I2510 Atherosclerotic heart disease of native coronary artery without angina pectoris: Secondary | ICD-10-CM | POA: Insufficient documentation

## 2016-03-21 DIAGNOSIS — M109 Gout, unspecified: Secondary | ICD-10-CM | POA: Insufficient documentation

## 2016-03-21 DIAGNOSIS — Z794 Long term (current) use of insulin: Secondary | ICD-10-CM | POA: Insufficient documentation

## 2016-03-21 DIAGNOSIS — I739 Peripheral vascular disease, unspecified: Secondary | ICD-10-CM | POA: Insufficient documentation

## 2016-03-21 DIAGNOSIS — Z6839 Body mass index (BMI) 39.0-39.9, adult: Secondary | ICD-10-CM | POA: Insufficient documentation

## 2016-03-21 DIAGNOSIS — E1122 Type 2 diabetes mellitus with diabetic chronic kidney disease: Secondary | ICD-10-CM | POA: Insufficient documentation

## 2016-03-21 DIAGNOSIS — Z1211 Encounter for screening for malignant neoplasm of colon: Secondary | ICD-10-CM | POA: Insufficient documentation

## 2016-03-21 DIAGNOSIS — I5042 Chronic combined systolic (congestive) and diastolic (congestive) heart failure: Secondary | ICD-10-CM | POA: Diagnosis not present

## 2016-03-21 DIAGNOSIS — Z8673 Personal history of transient ischemic attack (TIA), and cerebral infarction without residual deficits: Secondary | ICD-10-CM | POA: Diagnosis not present

## 2016-03-21 DIAGNOSIS — K219 Gastro-esophageal reflux disease without esophagitis: Secondary | ICD-10-CM | POA: Diagnosis not present

## 2016-03-21 DIAGNOSIS — F330 Major depressive disorder, recurrent, mild: Secondary | ICD-10-CM

## 2016-03-21 DIAGNOSIS — F4322 Adjustment disorder with anxiety: Secondary | ICD-10-CM | POA: Diagnosis not present

## 2016-03-21 LAB — GLUCOSE, POCT (MANUAL RESULT ENTRY): POC GLUCOSE: 136 mg/dL — AB (ref 70–99)

## 2016-03-21 MED ORDER — FUROSEMIDE 20 MG PO TABS: ORAL_TABLET | ORAL | 3 refills | Status: DC

## 2016-03-21 MED ORDER — HYDROXYZINE HCL 25 MG PO TABS: 25.0000 mg | ORAL_TABLET | Freq: Three times a day (TID) | ORAL | 3 refills | Status: DC

## 2016-03-21 MED ORDER — BENZONATATE 100 MG PO CAPS: 100.0000 mg | ORAL_CAPSULE | Freq: Three times a day (TID) | ORAL | 0 refills | Status: DC | PRN

## 2016-03-21 MED ORDER — TRAZODONE HCL 50 MG PO TABS: 50.0000 mg | ORAL_TABLET | Freq: Every evening | ORAL | 3 refills | Status: DC | PRN

## 2016-03-21 NOTE — Progress Notes (Signed)
Brooke Stafford, is a 60 y.o. female  JQB:[id number]  IOX:[id number]  DOB - [birth year]  Chief Complaint  Patient presents with  . Cough       Subjective:   Brooke Stafford is a 60 y.o. female with multiple medical history including advanced coronary artery disease with previous bypass surgery and stent placement, chronic systolic and diastolic heart failure with occasionalacute exacerbation, insulin requiring type 2 diabetes mellitus complicated by chronic kidney disease stage III-IV, peripheral arterial disease, malignant hypertension, CVA in 10/2015 and Hyperlipidemia  here today for ED visit follow up. Patient was seen in the emergency room last night for increasing cough and neck swelling, was diagnosed with CHF exacerbation and given IV Lasix. Her total lasix dose was increased to 100 mg, to take 60 in the morning and 40 at night. She is complaining of chronic cough for which she has been treated with antibiotics earlier in November but she continues to cough especially at night. This may be related to her CHF, but patient has been taking different other over-the-counter medications including lozenges and cough syrups. She claims she feels better today, less shortness of breath, less swelling. No fever. Cough is improved. She has constipation which she claimed alternates with diarrhea. She has not had colonoscopy. She does not have abdominal swelling, no abdominal pain, no vomiting. Patient has No headache, No chest pain, No abdominal pain - No Nausea, No new weakness tingling or numbness.  Problem  Medication Management  Chronic Combined Systolic and Diastolic Congestive Heart Failure (Hcc)    ALLERGIES: Allergies  Allergen Reactions  . Digoxin And Related Other (See Comments)    Patient stated she almost died. Had flu like symptoms as well as diarrhea.  . Hydralazine Shortness Of Breath  . Lisinopril Other (See Comments)    Felt like she had the flu. Was very sick!!!  .  Penicillins Cross Reactors Hives    And high fever  . Adhesive [Tape] Rash    bruising    PAST MEDICAL HISTORY: Past Medical History:  Diagnosis Date  . Anemia   . Anginal pain ([place])   . Anxiety   . Arthritis    "stiff fingers and knees" (08/04/2013), (12/12/2014)  . Asthma   . CAD (coronary artery disease) 2002; 2015   CABG x 6 2002, cath 2011- med Rx stent DES VG-Diag  . CAD (coronary artery disease) of artery bypass graft; DES to VG-Diag 09/28/13 11/09/2013  . Cataract   . CHF (congestive heart failure) ([place])    "in 2002" (11/26/2012)  . Chronic bronchitis ([place])    "q year; in the winter"   . Chronic renal insufficiency, stage II (mild)    followed  by [facility]  . Coronary artery disease 2002   CABG x 6. Cath 5/11- med Rx  . GERD (gastroesophageal reflux disease)   . Gout    "right big toe"  . Headache    "~ q week" (08/04/2013); "~ twice/month" (12/12/2014)  . History of blood transfusion 2002   "when I had OHS"  . Hyperlipidemia   . Hypertension   . Hypothyroid    treated  . Migraines    "couple times/year" (08/04/2013), (12/12/2014)  . Myocardial infarction 2000; 2002; 2011  . Obesity (BMI 35.0-39.9 without comorbidity)   . Peripheral vascular disease ([place]) 12/12   LSFA PTA  . Pneumonia    "3 times I think" (12/12/2014)  . Type II diabetes mellitus ([place])     MEDICATIONS AT HOME: Prior  to Admission medications   Medication Sig Start Date End Date Taking? Authorizing Provider  acetaminophen-codeine (TYLENOL #3) 300-30 MG tablet Take 1 tablet by mouth every 4 (four) hours as needed for moderate pain. 01/11/16  Yes Tresa Garter, MD  albuterol (PROVENTIL HFA;VENTOLIN HFA) 108 (90 Base) MCG/ACT inhaler Inhale 2 puffs into the lungs every 6 (six) hours as needed for wheezing or shortness of breath. 09/28/15  Yes Tresa Garter, MD  allopurinol (ZYLOPRIM) 300 MG tablet Take 1 tablet (300 mg total) by mouth daily. 02/22/16  Yes Tresa Garter, MD    amLODipine (NORVASC) 10 MG tablet take 1 tablet by mouth once daily Patient taking differently: Take 10 mg by mouth daily 11/20/15  Yes Mihai Croitoru, MD  aspirin 81 MG chewable tablet Chew 1 tablet (81 mg total) by mouth daily. 02/24/15  Yes Mihai Croitoru, MD  atorvastatin (LIPITOR) 40 MG tablet Take 1 tablet (40 mg total) by mouth daily. 07/28/15  Yes Ripudeep Krystal Eaton, MD  benzonatate (TESSALON) 100 MG capsule Take 1 capsule (100 mg total) by mouth 3 (three) times daily as needed for cough. 03/21/16  Yes Tresa Garter, MD  cloNIDine (CATAPRES) 0.2 MG tablet Take 1 tablet (0.2 mg total) by mouth 3 (three) times daily. 02/22/16  Yes Tresa Garter, MD  clopidogrel (PLAVIX) 75 MG tablet Take 1 tablet (75 mg total) by mouth daily. 09/25/15  Yes Mihai Croitoru, MD  ezetimibe (ZETIA) 10 MG tablet Take 1 tablet (10 mg total) by mouth daily. 01/11/16  Yes Tresa Garter, MD  fluticasone (FLONASE) 50 MCG/ACT nasal spray Place 2 sprays into both nostrils daily as needed for allergies or rhinitis. 09/28/15  Yes Tresa Garter, MD  furosemide (LASIX) 20 MG tablet Take a total of 60 mg in the morning and 40 mg at night. 03/21/16  Yes Tresa Garter, MD  gabapentin (NEURONTIN) 400 MG capsule take 1 capsule by mouth three times a day 01/24/16  Yes Tanner Vigna E Doreene Burke, MD  insulin aspart (NOVOLOG FLEXPEN) 100 UNIT/ML FlexPen Inject 10-30 units into the skin 3 times daily, plus sliding scale as instructed. 12/14/15  Yes Philemon Kingdom, MD  Insulin Glargine (TOUJEO SOLOSTAR) 300 UNIT/ML SOPN Inject 35 Units into the skin at bedtime. 03/15/16  Yes Philemon Kingdom, MD  isosorbide mononitrate (IMDUR) 60 MG 24 hr tablet Take 2 tablets (120 mg total) by mouth daily. 02/22/16  Yes Tresa Garter, MD  levothyroxine (SYNTHROID, LEVOTHROID) 25 MCG tablet take 1 tablet by mouth every morning BEFORE BREAKFAST Patient taking differently: Take 25 mcg by mouth every morning 11/24/15  Yes Tresa Garter,  MD  loratadine (CLARITIN) 10 MG tablet Take 1 tablet (10 mg total) by mouth daily as needed for allergies. 12/15/14  Yes Luke K Kilroy, PA-C  magnesium citrate SOLN Take 296 mLs (1 Bottle total) by mouth as needed for severe constipation. 07/28/15  Yes Ripudeep Krystal Eaton, MD  metoprolol succinate (TOPROL-XL) 50 MG 24 hr tablet Take 3 tablets (150 mg total) by mouth daily. Take with or immediately following a meal. 03/08/16  Yes Rhonda G Barrett, PA-C  Naphazoline HCl (CLEAR EYES OP) Place 1 drop into both eyes as needed (for dry eyes).   Yes Historical Provider, MD  nitroGLYCERIN (NITROSTAT) 0.4 MG SL tablet Place 1 tablet (0.4 mg total) under the tongue every 5 (five) minutes as needed. For chest 11/10/14  Yes Kenyah Luba E Doreene Burke, MD  ranitidine (ZANTAC 75) 75 MG tablet Take 1 tablet (  75 mg total) by mouth 2 (two) times daily. Patient taking differently: Take 75 mg by mouth 2 (two) times daily as needed for heartburn.  07/28/15  Yes Ripudeep Krystal Eaton, MD  senna-docusate (SENOKOT-S) 8.6-50 MG tablet Take 1 tablet by mouth at bedtime as needed for mild constipation. 12/23/15  Yes Virgel Manifold, MD  traZODone (DESYREL) 50 MG tablet Take 1 tablet (50 mg total) by mouth at bedtime as needed for sleep. 03/21/16  Yes Tresa Garter, MD  triamcinolone ointment (KENALOG) 0.5 % Apply 1 application topically 2 (two) times daily. Patient taking differently: Apply 1 application topically daily as needed (for skin).  08/24/15  Yes Tresa Garter, MD  hydrOXYzine (ATARAX/VISTARIL) 25 MG tablet Take 1 tablet (25 mg total) by mouth 3 (three) times daily. 03/21/16   Tresa Garter, MD    Objective:   Vitals:   03/21/16 1019  BP: (!) 165/79  Pulse: 61  Resp: 18  Temp: 97.9 F (36.6 C)  TempSrc: Oral  SpO2: 99%  Weight: 245 lb 3.2 oz (111.2 kg)  Height: 6\' 1"  (1.854 m)   Exam General appearance : Awake, alert, not in any distress. Speech Clear. Not toxic looking, obese  HEENT: Atraumatic and Normocephalic,  pupils equally reactive to light and accomodation Neck: Supple, no JVD. No cervical lymphadenopathy.  Chest: Good air entry bilaterally, no added sounds  CVS: S1 S2 regular, no murmurs.  Abdomen: Bowel sounds present, Non tender and not distended with no gaurding, rigidity or rebound. Extremities: B/L Lower Ext shows mild pitting pedal edema, both legs are warm to touch Neurology: Awake alert, and oriented X 3, CN II-XII intact, Non focal Skin: No Rash  Data Review Lab Results  Component Value Date   HGBA1C 5.9 03/15/2016   HGBA1C 6.2 11/02/2015   HGBA1C 7.2 (H) 07/25/2015    Assessment & Plan   1. Type 2 diabetes mellitus with diabetic neuropathy, with long-term current use of insulin (HCC) Hemoglobin A1c is down to 5.9% although patient's hemoglobin has gone down as well which may give a false low hemoglobin A1c. We will continue current regimen  2. Medication management  - Patient has been having frequent fluid overload in the recent past, was in the emergency room yesterday, given IV Lasix. Today she is to have positive pedal edema. - Increase Lasix to 100 mg total daily dose, take 60 mg in the morning and 40 mg in the evening. - Glucose (CBG)  3. Essential hypertension, benign  We have discussed target BP range and blood pressure goal. I have advised patient to check BP regularly and to call us back or report to clinic if the numbers are consistently higher than 140/90. We discussed the importance of compliance with medical therapy and DASH diet recommended, consequences of uncontrolled hypertension discussed.  - continue current BP medications  4. Adjustment disorder with anxious mood  - hydrOXYzine (ATARAX/VISTARIL) 25 MG tablet; Take 1 tablet (25 mg total) by mouth 3 (three) times daily.  Dispense: 90 tablet; Refill: 3  5. Chronic combined systolic and diastolic congestive heart failure (HCC)  - furosemide (LASIX) 20 MG tablet; Take a total of 60 mg in the morning and  40 mg at night.  Dispense: 150 tablet; Refill: 3 - benzonatate (TESSALON) 100 MG capsule; Take 1 capsule (100 mg total) by mouth 3 (three) times daily as needed for cough.  Dispense: 30 capsule; Refill: 0  6. Mild episode of recurrent major depressive disorder (HCC)  - traZODone (  DESYREL) 50 MG tablet; Take 1 tablet (50 mg total) by mouth at bedtime as needed for sleep.  Dispense: 30 tablet; Refill: 3  7. Colon cancer screening  - Ambulatory referral to Gastroenterology  Patient have been counseled extensively about nutrition and exercise. Other issues discussed during this visit include: low cholesterol diet, weight control and daily exercise, foot care, annual eye examinations at Ophthalmology, importance of adherence with medications and regular follow-up. We also discussed long term complications of uncontrolled diabetes and hypertension.   Return in about 4 weeks (around 04/18/2016) for Heart Failure and Hypertension, Generalized Anxiety Disorder.  The patient was given clear instructions to go to ER or return to medical center if symptoms don't improve, worsen or new problems develop. The patient verbalized understanding. The patient was told to call to get lab results if they haven't heard anything in the next week.   This note has been created with Surveyor, quantity. Any transcriptional errors are unintentional.    Angelica Chessman, MD, [place], Karilyn Cota, [place] and [place], [place]   03/21/2016, 11:04 AM

## 2016-03-21 NOTE — Patient Instructions (Signed)
Generalized Anxiety Disorder Generalized anxiety disorder (GAD) is a mental disorder. It interferes with life functions, including relationships, work, and school. GAD is different from normal anxiety, which everyone experiences at some point in their lives in response to specific life events and activities. Normal anxiety actually helps [place] prepare for and get through these life events and activities. Normal anxiety goes away after the event or activity is over.  GAD causes anxiety that is not necessarily related to specific events or activities. It also causes excess anxiety in proportion to specific events or activities. The anxiety associated with GAD is also difficult to control. GAD can vary from mild to severe. People with severe GAD can have intense waves of anxiety with physical symptoms (panic attacks).  SYMPTOMS The anxiety and worry associated with GAD are difficult to control. This anxiety and worry are related to many life events and activities and also occur more days than not for 6 months or longer. People with GAD also have three or more of the following symptoms (one or more in children):  Restlessness.   Fatigue.  Difficulty concentrating.   Irritability.  Muscle tension.  Difficulty sleeping or unsatisfying sleep. DIAGNOSIS GAD is diagnosed through an assessment by your health care provider. Your health care provider will ask you questions aboutyour mood,physical symptoms, and events in your life. Your health care provider may ask you about your medical history and use of alcohol or drugs, including prescription medicines. Your health care provider may also do a physical exam and blood tests. Certain medical conditions and the use of certain substances can cause symptoms similar to those associated with GAD. Your health care provider may refer you to a mental health specialist for further evaluation. TREATMENT The following therapies are usually used to treat GAD:    Medication. Antidepressant medication usually is prescribed for long-term daily control. Antianxiety medicines may be added in severe cases, especially when panic attacks occur.   Talk therapy (psychotherapy). Certain types of talk therapy can be helpful in treating GAD by providing support, education, and guidance. A form of talk therapy called cognitive behavioral therapy can teach you healthy ways to think about and react to daily life events and activities.  Stress managementtechniques. These include yoga, meditation, and exercise and can be very helpful when they are practiced regularly. A mental health specialist can help determine which treatment is best for you. Some people see improvement with one therapy. However, other people require a combination of therapies. This information is not intended to replace advice given to you by your health care provider. Make sure you discuss any questions you have with your health care provider. Document Released: 07/27/2012 Document Revised: 04/22/2014 Document Reviewed: 07/27/2012 Elsevier Interactive Patient Education  2017 [employer]. Heart Failure Heart failure is a condition in which the heart has trouble pumping blood because it has become weak or stiff. This means that the heart does not pump blood efficiently for the body to work well. For some people with heart failure, fluid may back up into the lungs and there may be swelling (edema) in the lower legs. Heart failure is usually a long-term (chronic) condition. It is important for you to take good care of yourself and follow the treatment plan from your health care provider. What are the causes? This condition is caused by some health problems, including:  High blood pressure (hypertension). Hypertension causes the heart muscle to work harder than normal. High blood pressure eventually causes the heart to become stiff  and weak.  Coronary artery disease (CAD). CAD is the buildup of  cholesterol and fat (plaques) in the arteries of the heart.  Heart attack (myocardial infarction). Injured tissue, which is caused by the heart attack, does not contract as well and the heart's ability to pump blood is weakened.  Abnormal heart valves. When the heart valves do not open and close properly, the heart muscle must pump harder to keep the blood flowing.  Heart muscle disease (cardiomyopathy or myocarditis). Heart muscle disease is damage to the heart muscle from a variety of causes, such as drug or alcohol abuse, infections, or unknown causes. These can increase the risk of heart failure.  Lung disease. When the lungs do not work properly, the heart must work harder. What increases the risk? Risk of heart failure increases as a person ages. This condition is also more likely to develop in people who:  Are overweight.  Are female.  Smoke or chew tobacco.  Abuse alcohol or illegal drugs.  Have taken medicines that can damage the heart, such as chemotherapy drugs.  Have diabetes.  High blood sugar (glucose) is associated with high fat (lipid) levels in the blood.  Diabetes can also damage tiny blood vessels that carry nutrients to the heart muscle.  Have abnormal heart rhythms.  Have thyroid problems.  Have low blood counts (anemia). What are the signs or symptoms? Symptoms of this condition include:  Shortness of breath with activity, such as when climbing stairs.  Persistent cough.  Swelling of the feet, ankles, legs, or abdomen.  Unexplained weight gain.  Difficulty breathing when lying flat (orthopnea).  Waking from sleep because of the need to sit up and get more air.  Rapid heartbeat.  Fatigue and loss of energy.  Feeling light-headed, dizzy, or close to fainting.  Loss of appetite.  Nausea.  Increased urination during the night (nocturia).  Confusion. How is this diagnosed? This condition is diagnosed based on:  Medical history, symptoms,  and a physical exam.  Diagnostic tests, which may include:  Echocardiogram.  Electrocardiogram (ECG).  Chest X-ray.  Blood tests.  Exercise stress test.  Radionuclide scans.  Cardiac catheterization and angiogram. How is this treated? Treatment for this condition is aimed at managing the symptoms of heart failure. Medicines, behavioral changes, or other treatments may be necessary to treat heart failure. Medicines  These may include:  Angiotensin-converting enzyme (ACE) inhibitors. This type of medicine blocks the effects of a blood protein called angiotensin-converting enzyme. ACE inhibitors relax (dilate) the blood vessels and help to lower blood pressure.  Angiotensin receptor blockers (ARBs). This type of medicine blocks the actions of a blood protein called angiotensin. ARBs dilate the blood vessels and help to lower blood pressure.  Water pills (diuretics). Diuretics cause the kidneys to remove salt and water from the blood. The extra fluid is removed through urination, leaving a lower volume of blood that the heart has to pump.  Beta blockers. These improve heart muscle strength and they prevent the heart from beating too quickly.  Digoxin. This increases the force of the heartbeat. Healthy behavior changes  These may include:  Reaching and maintaining a healthy weight.  Stopping smoking or chewing tobacco.  Eating heart-healthy foods.  Limiting or avoiding alcohol.  Stopping use of street drugs (illegal drugs).  Physical activity. Other treatments  These may include:  Surgery to open blocked coronary arteries or repair damaged heart valves.  Placement of a biventricular pacemaker to improve heart muscle function (cardiac resynchronization  therapy). This device paces both the right ventricle and left ventricle.  Placement of a device to treat serious abnormal heart rhythms (implantable cardioverter defibrillator, or ICD).  Placement of a device to improve  the pumping ability of the heart (left ventricular assist device, or LVAD).  Heart transplant. This can cure heart failure, and it is considered for certain patients who do not improve with other therapies. Follow these instructions at home: Medicines  Take over-the-counter and prescription medicines only as told by your health care provider. Medicines are important in reducing the workload of your heart, slowing the progression of heart failure, and improving your symptoms.  Do not stop taking your medicine unless your health care provider told you to do that.  Do not skip any dose of medicine.  Refill your prescriptions before you run out of medicine. You need your medicines every day. Eating and drinking  Eat heart-healthy foods. Talk with a dietitian to make an eating plan that is right for you.  Choose foods that contain no trans fat and are low in saturated fat and cholesterol. Healthy choices include fresh or frozen fruits and vegetables, fish, lean meats, legumes, fat-free or low-fat dairy products, and whole-grain or high-fiber foods.  Limit salt (sodium) if directed by your health care provider. Sodium restriction may reduce symptoms of heart failure. Ask a dietitian to recommend heart-healthy seasonings.  Use healthy cooking methods instead of frying. Healthy methods include roasting, grilling, broiling, baking, poaching, steaming, and stir-frying.  Limit your fluid intake if directed by your health care provider. Fluid restriction may reduce symptoms of heart failure. Lifestyle  Stop smoking or using chewing tobacco. Nicotine and tobacco can damage your heart and your blood vessels. Do not use nicotine gum or patches before talking to your health care provider.  Limit alcohol intake to no more than 1 drink per day for non-pregnant women and 2 drinks per day for men. One drink equals 12 oz of beer, 5 oz of wine, or 1 oz of hard liquor.  Drinking more than that is harmful to  your heart. Tell your health care provider if you drink alcohol several times a week.  Talk with your health care provider about whether any level of alcohol use is safe for you.  If your heart has already been damaged by alcohol or you have severe heart failure, drinking alcohol should be stopped completely.  Stop use of illegal drugs.  Lose weight if directed by your health care provider. Weight loss may reduce symptoms of heart failure.  Do moderate physical activity if directed by your health care provider. People who are elderly and people with severe heart failure should consult with a health care provider for physical activity recommendations. Monitor important information  Weigh yourself every day. Keeping track of your weight daily helps you to notice excess fluid sooner.  Weigh yourself every morning after you urinate and before you eat breakfast.  Wear the same amount of clothing each time you weigh yourself.  Record your daily weight. Provide your health care provider with your weight record.  Monitor and record your blood pressure as told by your health care provider.  Check your pulse as told by your health care provider. Dealing with extreme temperatures  If the weather is extremely hot:  Avoid vigorous physical activity.  Use air conditioning or fans or seek a cooler location.  Avoid caffeine and alcohol.  Wear loose-fitting, lightweight, and light-colored clothing.  If the weather is extremely cold:  Avoid vigorous physical activity.  Layer your clothes.  Wear mittens or gloves, a hat, and a scarf when you go outside.  Avoid alcohol. General instructions  Manage other health conditions such as hypertension, diabetes, thyroid disease, or abnormal heart rhythms as told by your health care provider.  Learn to manage stress. If you need help to do this, ask your health care provider.  Plan rest periods when fatigued.  Get ongoing education and support  as needed.  Participate in or seek rehabilitation as needed to maintain or improve independence and quality of life.  Stay up to date with immunizations. Keeping current on pneumococcal and influenza immunizations is especially important to prevent respiratory infections.  Keep all follow-up visits as told by your health care provider. This is important. Contact a health care provider if:  You have a rapid weight gain.  You have increasing shortness of breath that is unusual for you.  You are unable to participate in your usual physical activities.  You tire easily.  You cough more than normal, especially with physical activity.  You have any swelling or more swelling in areas such as your hands, feet, ankles, or abdomen.  You are unable to sleep because it is hard to breathe.  You feel like your heart is beating quickly (palpitations).  You become dizzy or light-headed when you stand up. Get help right away if:  You have difficulty breathing.  You notice or your family notices a change in your awareness, such as having trouble staying awake or having difficulty with concentration.  You have pain or discomfort in your chest.  You have an episode of fainting (syncope). This information is not intended to replace advice given to you by your health care provider. Make sure you discuss any questions you have with your health care provider. Document Released: 04/01/2005 Document Revised: 12/05/2015 Document Reviewed: 10/25/2015 Elsevier Interactive Patient Education  2017 [employer].

## 2016-03-21 NOTE — Addendum Note (Signed)
Addended by: Trecia Rogers on: 03/21/2016 11:17 AM   Modules accepted: Orders

## 2016-03-21 NOTE — Progress Notes (Signed)
Patient is here for FU ED  Patient has taken medication today. Patient has eaten today.  Patient complains of cold symptoms being present.

## 2016-03-24 ENCOUNTER — Other Ambulatory Visit: Payer: Self-pay | Admitting: Internal Medicine

## 2016-03-25 ENCOUNTER — Inpatient Hospital Stay (HOSPITAL_COMMUNITY)
Admission: EM | Admit: 2016-03-25 | Discharge: 2016-04-01 | DRG: 291 | Disposition: A | Discharge status: Home Health | Payer: Medicare Other | Attending: Internal Medicine | Admitting: Internal Medicine

## 2016-03-25 ENCOUNTER — Emergency Department (HOSPITAL_COMMUNITY): Payer: Medicare Other

## 2016-03-25 ENCOUNTER — Encounter (HOSPITAL_COMMUNITY): Payer: Self-pay

## 2016-03-25 DIAGNOSIS — I5042 Chronic combined systolic (congestive) and diastolic (congestive) heart failure: Secondary | ICD-10-CM

## 2016-03-25 DIAGNOSIS — Z951 Presence of aortocoronary bypass graft: Secondary | ICD-10-CM

## 2016-03-25 DIAGNOSIS — Z7951 Long term (current) use of inhaled steroids: Secondary | ICD-10-CM

## 2016-03-25 DIAGNOSIS — Z880 Allergy status to penicillin: Secondary | ICD-10-CM

## 2016-03-25 DIAGNOSIS — Z794 Long term (current) use of insulin: Secondary | ICD-10-CM

## 2016-03-25 DIAGNOSIS — J189 Pneumonia, unspecified organism: Secondary | ICD-10-CM | POA: Diagnosis not present

## 2016-03-25 DIAGNOSIS — E1151 Type 2 diabetes mellitus with diabetic peripheral angiopathy without gangrene: Secondary | ICD-10-CM | POA: Diagnosis not present

## 2016-03-25 DIAGNOSIS — I252 Old myocardial infarction: Secondary | ICD-10-CM

## 2016-03-25 DIAGNOSIS — T502X5A Adverse effect of carbonic-anhydrase inhibitors, benzothiadiazides and other diuretics, initial encounter: Secondary | ICD-10-CM | POA: Diagnosis present

## 2016-03-25 DIAGNOSIS — J9601 Acute respiratory failure with hypoxia: Secondary | ICD-10-CM | POA: Diagnosis present

## 2016-03-25 DIAGNOSIS — D638 Anemia in other chronic diseases classified elsewhere: Secondary | ICD-10-CM | POA: Diagnosis present

## 2016-03-25 DIAGNOSIS — J440 Chronic obstructive pulmonary disease with acute lower respiratory infection: Secondary | ICD-10-CM | POA: Diagnosis present

## 2016-03-25 DIAGNOSIS — R0602 Shortness of breath: Secondary | ICD-10-CM | POA: Diagnosis not present

## 2016-03-25 DIAGNOSIS — Z833 Family history of diabetes mellitus: Secondary | ICD-10-CM

## 2016-03-25 DIAGNOSIS — E785 Hyperlipidemia, unspecified: Secondary | ICD-10-CM | POA: Diagnosis present

## 2016-03-25 DIAGNOSIS — N185 Chronic kidney disease, stage 5: Secondary | ICD-10-CM | POA: Diagnosis present

## 2016-03-25 DIAGNOSIS — I248 Other forms of acute ischemic heart disease: Secondary | ICD-10-CM | POA: Diagnosis present

## 2016-03-25 DIAGNOSIS — E1122 Type 2 diabetes mellitus with diabetic chronic kidney disease: Secondary | ICD-10-CM | POA: Diagnosis present

## 2016-03-25 DIAGNOSIS — I5043 Acute on chronic combined systolic (congestive) and diastolic (congestive) heart failure: Secondary | ICD-10-CM

## 2016-03-25 DIAGNOSIS — R0600 Dyspnea, unspecified: Secondary | ICD-10-CM

## 2016-03-25 DIAGNOSIS — E876 Hypokalemia: Secondary | ICD-10-CM | POA: Diagnosis present

## 2016-03-25 DIAGNOSIS — M109 Gout, unspecified: Secondary | ICD-10-CM | POA: Diagnosis present

## 2016-03-25 DIAGNOSIS — I132 Hypertensive heart and chronic kidney disease with heart failure and with stage 5 chronic kidney disease, or end stage renal disease: Principal | ICD-10-CM | POA: Diagnosis present

## 2016-03-25 DIAGNOSIS — R0902 Hypoxemia: Secondary | ICD-10-CM

## 2016-03-25 DIAGNOSIS — Z823 Family history of stroke: Secondary | ICD-10-CM

## 2016-03-25 DIAGNOSIS — Z79899 Other long term (current) drug therapy: Secondary | ICD-10-CM

## 2016-03-25 DIAGNOSIS — Z7982 Long term (current) use of aspirin: Secondary | ICD-10-CM

## 2016-03-25 DIAGNOSIS — R069 Unspecified abnormalities of breathing: Secondary | ICD-10-CM | POA: Diagnosis not present

## 2016-03-25 DIAGNOSIS — Z955 Presence of coronary angioplasty implant and graft: Secondary | ICD-10-CM

## 2016-03-25 DIAGNOSIS — F419 Anxiety disorder, unspecified: Secondary | ICD-10-CM | POA: Diagnosis present

## 2016-03-25 DIAGNOSIS — Z9049 Acquired absence of other specified parts of digestive tract: Secondary | ICD-10-CM

## 2016-03-25 DIAGNOSIS — E669 Obesity, unspecified: Secondary | ICD-10-CM | POA: Diagnosis present

## 2016-03-25 DIAGNOSIS — K219 Gastro-esophageal reflux disease without esophagitis: Secondary | ICD-10-CM | POA: Diagnosis present

## 2016-03-25 DIAGNOSIS — I2510 Atherosclerotic heart disease of native coronary artery without angina pectoris: Secondary | ICD-10-CM | POA: Diagnosis present

## 2016-03-25 DIAGNOSIS — Z87891 Personal history of nicotine dependence: Secondary | ICD-10-CM

## 2016-03-25 DIAGNOSIS — I10 Essential (primary) hypertension: Secondary | ICD-10-CM

## 2016-03-25 DIAGNOSIS — I69351 Hemiplegia and hemiparesis following cerebral infarction affecting right dominant side: Secondary | ICD-10-CM

## 2016-03-25 DIAGNOSIS — R7989 Other specified abnormal findings of blood chemistry: Secondary | ICD-10-CM

## 2016-03-25 DIAGNOSIS — N184 Chronic kidney disease, stage 4 (severe): Secondary | ICD-10-CM

## 2016-03-25 DIAGNOSIS — E1159 Type 2 diabetes mellitus with other circulatory complications: Secondary | ICD-10-CM

## 2016-03-25 DIAGNOSIS — R791 Abnormal coagulation profile: Secondary | ICD-10-CM | POA: Diagnosis present

## 2016-03-25 DIAGNOSIS — Z7902 Long term (current) use of antithrombotics/antiplatelets: Secondary | ICD-10-CM

## 2016-03-25 DIAGNOSIS — E039 Hypothyroidism, unspecified: Secondary | ICD-10-CM | POA: Diagnosis present

## 2016-03-25 DIAGNOSIS — Z8249 Family history of ischemic heart disease and other diseases of the circulatory system: Secondary | ICD-10-CM

## 2016-03-25 HISTORY — DX: Chronic kidney disease, unspecified: N18.9

## 2016-03-25 LAB — I-STAT TROPONIN, ED: TROPONIN I, POC: 0.03 ng/mL (ref 0.00–0.08)

## 2016-03-25 LAB — CBC
HCT: 30.7 % — ABNORMAL LOW (ref 36.0–46.0)
HEMOGLOBIN: 10.1 g/dL — AB (ref 12.0–15.0)
MCH: 31.4 pg (ref 26.0–34.0)
MCHC: 32.9 g/dL (ref 30.0–36.0)
MCV: 95.3 fL (ref 78.0–100.0)
Platelets: 234 10*3/uL (ref 150–400)
RBC: 3.22 MIL/uL — ABNORMAL LOW (ref 3.87–5.11)
RDW: 15.4 % (ref 11.5–15.5)
WBC: 15.8 10*3/uL — ABNORMAL HIGH (ref 4.0–10.5)

## 2016-03-25 LAB — BASIC METABOLIC PANEL
Anion gap: 9 (ref 5–15)
BUN: 32 mg/dL — ABNORMAL HIGH (ref 6–20)
CALCIUM: 8.9 mg/dL (ref 8.9–10.3)
CHLORIDE: 101 mmol/L (ref 101–111)
CO2: 27 mmol/L (ref 22–32)
Creatinine, Ser: 3.38 mg/dL — ABNORMAL HIGH (ref 0.44–1.00)
GFR calc non Af Amer: 14 mL/min — ABNORMAL LOW (ref >60)
GFR, EST AFRICAN AMERICAN: 16 mL/min — AB (ref >60)
Glucose, Bld: 196 mg/dL — ABNORMAL HIGH (ref 65–99)
Potassium: 3.6 mmol/L (ref 3.5–5.1)
SODIUM: 137 mmol/L (ref 135–145)

## 2016-03-25 LAB — D-DIMER, QUANTITATIVE: D-Dimer, Quant: 1.61 ug/mL-FEU — ABNORMAL HIGH (ref 0.00–0.50)

## 2016-03-25 MED ORDER — LEVOFLOXACIN IN D5W 750 MG/150ML IV SOLN
750.0000 mg | Freq: Once | INTRAVENOUS | Status: AC
  Administered 2016-03-26: 750 mg via INTRAVENOUS
  Filled 2016-03-25: qty 150

## 2016-03-25 MED ORDER — IPRATROPIUM-ALBUTEROL 0.5-2.5 (3) MG/3ML IN SOLN
3.0000 mL | Freq: Once | RESPIRATORY_TRACT | Status: AC
  Administered 2016-03-25: 3 mL via RESPIRATORY_TRACT
  Filled 2016-03-25: qty 3

## 2016-03-25 NOTE — ED Provider Notes (Signed)
[facility] DEPT Provider Note   CSN: [id number] Arrival date & time: 03/25/16  2138   History   Chief Complaint Chief Complaint  Patient presents with  . Shortness of Breath    HPI Brooke Stafford is a 60 y.o. female.  Patient with history of asthma vs COPD, CAD s/p CABG and stent, diastolic and systolic congestive heart failure (last ECHO 12/2015 LVEF 40-45%) presenting with acute onset of shortness of breath today around 6pm. Patient has had bronchitis in November treated with antibiotics; she has had continued cough since then and had her lasix doses adjusted due to being volume overloaded; she states that she has been compliant with her home medicines and has taken all of her 100mg  total lasix today. In the last couple of days she has had congestion, rhinorrhea, productive cough, headache and sinus pressure. She also endorses mild LE edema - has noticed R>L which is not normal for her; she denies pain associated with it; she thinks it may be associated to her flare gout in her right great toe. Patient denies chest pain, vision or hearing change.      Past Medical History:  Diagnosis Date  . Anemia   . Anginal pain ([place])   . Anxiety   . Arthritis    "stiff fingers and knees" (08/04/2013), (12/12/2014)  . Asthma   . CAD (coronary artery disease) 2002; 2015   CABG x 6 2002, cath 2011- med Rx stent DES VG-Diag  . CAD (coronary artery disease) of artery bypass graft; DES to VG-Diag 09/28/13 11/09/2013  . Cataract   . CHF (congestive heart failure) ([place])    "in 2002" (11/26/2012)  . Chronic bronchitis ([place])    "q year; in the winter"   . Chronic renal insufficiency, stage II (mild)    followed  by [facility]  . Coronary artery disease 2002   CABG x 6. Cath 5/11- med Rx  . GERD (gastroesophageal reflux disease)   . Gout    "right big toe"  . Headache    "~ q week" (08/04/2013); "~ twice/month" (12/12/2014)  . History of blood transfusion 2002   "when I had OHS"  .  Hyperlipidemia   . Hypertension   . Hypothyroid    treated  . Migraines    "couple times/year" (08/04/2013), (12/12/2014)  . Myocardial infarction 2000; 2002; 2011  . Obesity (BMI 35.0-39.9 without comorbidity)   . Peripheral vascular disease ([place]) 12/12   LSFA PTA  . Pneumonia    "3 times I think" (12/12/2014)  . Type II diabetes mellitus [facility])     Patient Active Problem List   Diagnosis Date Noted  . Medication management 03/21/2016  . Anemia of chronic disease 12/27/2015  . Acute on chronic systolic CHF (congestive heart failure) ([place])   . Acute CHF ([place]) 12/18/2015  . CHF exacerbation ([place]) 12/18/2015  . Constipation 12/18/2015  . Adjustment disorder with anxious mood 11/02/2015  . Posterior circulation stroke ([place]) 11/02/2015  . Abnormality of gait 10/23/2015  . Depression 09/28/2015  . Diabetes mellitus with circulatory complication, with long-term current use of insulin ([place]) 09/13/2015  . Diabetic retinopathy ([place]) 09/13/2015  . HTN (hypertension), benign 08/24/2015  . Grief reaction 08/24/2015  . Pain in the chest   . Acute kidney injury superimposed on chronic kidney disease ([place]) 07/25/2015  . Creatinine elevation 07/25/2015  . Chest pain at rest 07/25/2015  . Chronic combined systolic and diastolic congestive heart failure ([place]) 07/25/2015  . Rash of hands 02/23/2015  .  Essential hypertension, benign 02/23/2015  . Essential hypertension   . Angina pectoris ([place]) 12/12/2014  . Abnormal nuclear stress test   . Acute combined systolic and diastolic heart failure ([place]) 12/02/2014  . Seasonal allergies 08/11/2014  . Gout of big toe 08/11/2014  . Encounter for screening mammogram for breast cancer 05/16/2014  . Screening for colon cancer 05/16/2014  . Diabetic neuropathy, type II diabetes mellitus ([place]) 01/27/2014  . Atopic eczema 01/27/2014  . Precordial pain, atypical, negative MI, Musculature Skeletal pain  11/08/2013  . CKD (chronic kidney disease), stage III  11/08/2013  . Unstable angina ([place]) 09/28/2013  . Ischemic cardiomyopathy- new drop in EF 08/30/2013  . Chest pain 08/04/2013  . CAD -S/P PCI June 2015 and 12/14/14 02/01/2013  . Hypothyroidism 02/01/2013  . PVD, LSFA PTA 12/12 04/06/2011  . HTN (hypertension), malignant, patent renal arteries 04/06/2011  . Hx of CABG x 6 2002 04/06/2011  . Dyslipidemia 04/06/2011    Past Surgical History:  Procedure Laterality Date  . ABDOMINAL AORTAGRAM N/A 04/05/2011   Procedure: ABDOMINAL AORTAGRAM;  Surgeon: Lorretta Harp, MD;  Location: [facility] CATH LAB;  Service: Cardiovascular;  Laterality: N/A;  . APPENDECTOMY  1980  . BREAST CYST EXCISION Right 1970's  . CARDIAC CATHETERIZATION  2002  . CARDIAC CATHETERIZATION N/A 12/12/2014   Procedure: Left Heart Cath and Cors/Grafts Angiography;  Surgeon: Peter M Martinique, MD;  Location: [facility] CV LAB;  Service: Cardiovascular;  Laterality: N/A;  . CARDIAC CATHETERIZATION N/A 12/12/2014   Procedure: Coronary Stent Intervention;  Surgeon: Peter M Martinique, MD;  Location: [facility] CV LAB;  Service: Cardiovascular;  Laterality: N/A;  . [place]; 1980  . CHOLECYSTECTOMY  1982  . CORONARY ANGIOPLASTY WITH STENT PLACEMENT  2004; 2012   "I have 2 stents" (08/04/2013)  . CORONARY ANGIOPLASTY WITH STENT PLACEMENT  09/28/13   PTCA/ DES Xience stent to VG-Diag   . CORONARY ARTERY BYPASS GRAFT  11/20/2000   x6 LIMA to distal LAD, svg to first diag, svg to ramus intermediate branch and swquential SVG to cir marginal branch, SVG to posterior descending coronary and sequential SVG to first right posterolateral branch  . LEFT HEART CATHETERIZATION WITH CORONARY/GRAFT ANGIOGRAM N/A 09/28/2013   Procedure: LEFT HEART CATHETERIZATION WITH Beatrix Fetters;  Surgeon: Peter M Martinique, MD;  Location: [facility] CATH LAB;  Service: Cardiovascular;  Laterality: N/A;  . LOWER EXTREMITY ANGIOGRAM  12/01/2012   Procedure: LOWER EXTREMITY ANGIOGRAM;  Surgeon: Lorretta Harp, MD;  Location: [facility] CATH LAB;  Service: Cardiovascular;;  . NM MYOCAR PERF WALL MOTION  08/27/2004   negative  . PERCUTANEOUS STENT INTERVENTION Left 12/01/2012   Procedure: PERCUTANEOUS STENT INTERVENTION;  Surgeon: Lorretta Harp, MD;  Location[facility] CATH LAB;  Service: Cardiovascular;  Laterality: Left;  Left SFA  . PERIPHERAL ARTERIAL STENT GRAFT Left    SFA/notes 04/07/2011 (11/30/2012)  . RENAL ANGIOGRAM N/A 04/05/2011   Procedure: RENAL ANGIOGRAM;  Surgeon: Lorretta Harp, MD;  Location: [facility] CATH LAB;  Service: Cardiovascular;  Laterality: N/A;  . TUBAL LIGATION  1980    OB History    No data available       Home Medications    Prior to Admission medications   Medication Sig Start Date End Date Taking? Authorizing Provider  acetaminophen-codeine (TYLENOL #3) 300-30 MG tablet Take 1 tablet by mouth every 4 (four) hours as needed for moderate pain. 01/11/16  Yes Tresa Garter, MD  albuterol (PROVENTIL HFA;VENTOLIN HFA) 108 (90  Base) MCG/ACT inhaler Inhale 2 puffs into the lungs every 6 (six) hours as needed for wheezing or shortness of breath. 09/28/15  Yes Tresa Garter, MD  allopurinol (ZYLOPRIM) 300 MG tablet Take 1 tablet (300 mg total) by mouth daily. Patient taking differently: Take 300 mg by mouth every evening.  02/22/16  Yes Tresa Garter, MD  amLODipine (NORVASC) 10 MG tablet take 1 tablet by mouth once daily Patient taking differently: Take 10 mg by mouth daily 11/20/15  Yes Mihai Croitoru, MD  aspirin 81 MG chewable tablet Chew 1 tablet (81 mg total) by mouth daily. 02/24/15  Yes Mihai Croitoru, MD  atorvastatin (LIPITOR) 40 MG tablet Take 1 tablet (40 mg total) by mouth daily. Patient taking differently: Take 40 mg by mouth daily at 6 PM.  07/28/15  Yes Ripudeep Krystal Eaton, MD  benzonatate (TESSALON) 100 MG capsule Take 1 capsule (100 mg total) by mouth 3 (three) times daily as needed for cough. 03/21/16  Yes Tresa Garter, MD  cloNIDine (CATAPRES) 0.2  MG tablet Take 1 tablet (0.2 mg total) by mouth 3 (three) times daily. 02/22/16  Yes Tresa Garter, MD  clopidogrel (PLAVIX) 75 MG tablet Take 1 tablet (75 mg total) by mouth daily. 09/25/15  Yes Mihai Croitoru, MD  ezetimibe (ZETIA) 10 MG tablet Take 1 tablet (10 mg total) by mouth daily. 01/11/16  Yes Tresa Garter, MD  fluticasone (FLONASE) 50 MCG/ACT nasal spray Place 2 sprays into both nostrils daily as needed for allergies or rhinitis. 09/28/15  Yes Tresa Garter, MD  furosemide (LASIX) 20 MG tablet Take a total of 60 mg in the morning and 40 mg at night. 03/21/16  Yes Tresa Garter, MD  gabapentin (NEURONTIN) 400 MG capsule take 1 capsule by mouth three times a day Patient taking differently: take 1 capsule by mouth two times a day 01/24/16  Yes Tresa Garter, MD  hydrOXYzine (ATARAX/VISTARIL) 25 MG tablet Take 1 tablet (25 mg total) by mouth 3 (three) times daily. 03/21/16  Yes Olugbemiga Essie Christine, MD  insulin aspart (NOVOLOG FLEXPEN) 100 UNIT/ML FlexPen Inject 10-30 units into the skin 3 times daily, plus sliding scale as instructed. Patient taking differently: Inject 10-20 Units into the skin 3 (three) times daily with meals. Inject 10-30 units into the skin 3 times daily, plus sliding scale as instructed. 12/14/15  Yes Philemon Kingdom, MD  Insulin Glargine (TOUJEO SOLOSTAR) 300 UNIT/ML SOPN Inject 35 Units into the skin at bedtime. Patient taking differently: Inject 30 Units into the skin at bedtime.  03/15/16  Yes Philemon Kingdom, MD  isosorbide mononitrate (IMDUR) 60 MG 24 hr tablet Take 2 tablets (120 mg total) by mouth daily. 02/22/16  Yes Tresa Garter, MD  levothyroxine (SYNTHROID, LEVOTHROID) 25 MCG tablet take 1 tablet by mouth every morning BEFORE BREAKFAST Patient taking differently: Take 25 mcg by mouth every morning 11/24/15  Yes Tresa Garter, MD  loratadine (CLARITIN) 10 MG tablet Take 1 tablet (10 mg total) by mouth daily as needed for  allergies. Patient taking differently: Take 10 mg by mouth daily.  12/15/14  Yes Luke K Kilroy, PA-C  magnesium citrate SOLN Take 296 mLs (1 Bottle total) by mouth as needed for severe constipation. 07/28/15  Yes Ripudeep Krystal Eaton, MD  metoprolol succinate (TOPROL-XL) 50 MG 24 hr tablet Take 3 tablets (150 mg total) by mouth daily. Take with or immediately following a meal. 03/08/16  Yes Rhonda G Barrett, PA-C  Naphazoline  HCl (CLEAR EYES OP) Place 1 drop into both eyes as needed (for dry eyes).   Yes Historical Provider, MD  nitroGLYCERIN (NITROSTAT) 0.4 MG SL tablet Place 1 tablet (0.4 mg total) under the tongue every 5 (five) minutes as needed. For chest 11/10/14  Yes Olugbemiga E Doreene Burke, MD  ranitidine (ZANTAC 75) 75 MG tablet Take 1 tablet (75 mg total) by mouth 2 (two) times daily. Patient taking differently: Take 75 mg by mouth 2 (two) times daily as needed for heartburn.  07/28/15  Yes Ripudeep Krystal Eaton, MD  senna-docusate (SENOKOT-S) 8.6-50 MG tablet Take 1 tablet by mouth at bedtime as needed for mild constipation. 12/23/15  Yes Virgel Manifold, MD  simethicone (MYLICON) 903 MG chewable tablet Chew 125 mg by mouth every 6 (six) hours as needed for flatulence.   Yes Historical Provider, MD  traZODone (DESYREL) 50 MG tablet Take 1 tablet (50 mg total) by mouth at bedtime as needed for sleep. 03/21/16  Yes Tresa Garter, MD  triamcinolone ointment (KENALOG) 0.5 % Apply 1 application topically 2 (two) times daily. Patient taking differently: Apply 1 application topically daily as needed (for skin).  08/24/15  Yes Tresa Garter, MD    Family History Family History  Problem Relation Age of Onset  . Diabetes Mother   . Hypertension Mother   . Stroke Mother   . Hypertension Father   . Hypertension Brother   . Hypertension Sister   . Diabetes Sister   . Hyperlipidemia Sister     Social History Social History  Substance Use Topics  . Smoking status: Former Smoker    Packs/day: 0.00     Years: 25.00    Types: Cigarettes    Quit date: 04/04/2000  . Smokeless tobacco: Never Used  . Alcohol use No     Comment: 12/12/2014  "have a glass of red wine on my birthday q yr; that's it"     Allergies   Digoxin and related; Hydralazine; Lisinopril; Penicillins cross reactors; and Adhesive [tape]   Review of Systems Review of Systems  Constitutional: Positive for chills. Negative for activity change, appetite change and fever.  HENT: Positive for congestion, postnasal drip, rhinorrhea and sinus pressure. Negative for hearing loss.   Eyes: Negative for discharge and visual disturbance.  Respiratory: Positive for cough and shortness of breath. Negative for chest tightness and wheezing.   Cardiovascular: Positive for leg swelling. Negative for chest pain and palpitations.  Gastrointestinal: Positive for constipation and diarrhea (chronic interchangeable symptoms). Negative for abdominal distention, abdominal pain, nausea and vomiting.  Endocrine: Negative for polyuria.  Genitourinary: Negative for decreased urine volume, dysuria, flank pain, frequency and hematuria.  Musculoskeletal: Negative for myalgias.  Skin: Negative for rash.  Neurological: Negative for dizziness, weakness, light-headedness and numbness.  Hematological: Negative for adenopathy.  Psychiatric/Behavioral: Negative for confusion.     Physical Exam Updated Vital Signs BP 173/88   Pulse 81   Temp 97.6 F (36.4 C)   Resp 21   Ht 5\' 11"  (1.803 m)   Wt 108 kg   LMP 10/13/2014 (Exact Date)   SpO2 95%   BMI 33.19 kg/m   Physical Exam  Constitutional: She appears well-developed and well-nourished. No distress.  HENT:  Head: Normocephalic and atraumatic.  Right Ear: Hearing, tympanic membrane, external ear and ear canal normal.  Left Ear: Hearing, tympanic membrane, external ear and ear canal normal.  Nose: Mucosal edema present. Right sinus exhibits maxillary sinus tenderness (mild). Left sinus exhibits  maxillary  sinus tenderness (mild).  Mouth/Throat: Uvula is midline and oropharynx is clear and moist. No oropharyngeal exudate.  Neck: Trachea normal and normal range of motion. No tracheal tenderness present. No tracheal deviation, no edema and normal range of motion present.  Cardiovascular: Normal rate, regular rhythm, S1 normal, S2 normal and intact distal pulses.  Exam reveals no gallop.   No murmur heard. 1+ pitting edema on R to knee without erythema, skin changes, or tenderness; mild pitting edema on Left.   Pulmonary/Chest: No respiratory distress. She has decreased breath sounds. She has wheezes (mild diffuse). She has rhonchi. She has no rales.  Abdominal: Soft. Normal appearance and bowel sounds are normal. She exhibits no distension. There is no tenderness.  Musculoskeletal: Normal range of motion.       Feet:  Skin: Skin is dry and intact. She is not diaphoretic. No cyanosis. No pallor. Nails show no clubbing.  Psychiatric: She has a normal mood and affect. Her speech is normal and behavior is normal. Judgment and thought content normal. Cognition and memory are normal.     ED Treatments / Results  Labs (all labs ordered are listed, but only abnormal results are displayed) Labs Reviewed  CBC - Abnormal; Notable for the following:       Result Value   WBC 15.8 (*)    RBC 3.22 (*)    Hemoglobin 10.1 (*)    HCT 30.7 (*)    All other components within normal limits  BASIC METABOLIC PANEL - Abnormal; Notable for the following:    Glucose, Bld 196 (*)    BUN 32 (*)    Creatinine, Ser 3.38 (*)    GFR calc non Af Amer 14 (*)    GFR calc Af Amer 16 (*)    All other components within normal limits  D-DIMER, QUANTITATIVE (NOT AT [facility]) - Abnormal; Notable for the following:    D-Dimer, Quant 1.61 (*)    All other components within normal limits  I-STAT TROPOININ, ED    EKG  EKG Interpretation  Date/Time:  Monday March 25 2016 21:44:07 EST Ventricular Rate:  90 PR  Interval:    QRS Duration: 113 QT Interval:  396 QTC Calculation: 485 R Axis:   34 Text Interpretation:  Sinus rhythm Probable left atrial enlargement Borderline intraventricular conduction delay Repol abnrm suggests ischemia, lateral leads Confirmed by BELFI  MD, MELANIE ([id number]) on 03/25/2016 10:24:52 PM       Radiology Dg Chest Port 1 View  Result Date: 03/25/2016 CLINICAL DATA:  60 y/o  F; shortness of breath worse today. EXAM: PORTABLE CHEST 1 VIEW COMPARISON:  03/20/2016 chest radiograph FINDINGS: A interval increase in interstitial markings and hazy consolidations of the right greater than left lung bases . Stable linear opacities in left mid lung zone probably representing atelectasis/ scarring. Post median sternotomy with wires in alignment and CABG. Stable mild cardiomegaly given projection and technique. Aortic atherosclerosis with arch calcification. IMPRESSION: Interval increase in interstitial markings and right greater than left lung base hazy consolidations probably represents pulmonary edema, multifocal pneumonia is also possible. Stable cardiomegaly. Electronically Signed   By: Kristine Garbe M.D.   On: 03/25/2016 23:13    Procedures Procedures (including critical care time)  Medications Ordered in ED Medications  levofloxacin (LEVAQUIN) IVPB 750 mg (not administered)  ipratropium-albuterol (DUONEB) 0.5-2.5 (3) MG/3ML nebulizer solution 3 mL (3 mLs Nebulization Given 03/25/16 2302)   Initial Impression / Assessment and Plan / ED Course  I have reviewed the  triage vital signs and the nursing notes.  Pertinent labs & imaging results that were available during my care of the patient were reviewed by me and considered in my medical decision making (see chart for details).  Clinical Course    Patient with 3 day history of shortness of breath with acute worsening tonight despite home albuterol administration. On arrival, EMS noted O2 saturation of 70% on room air  and was placed on non-rebreather, given albuterol and atrovent treatments without tolerance of down-titrating to [place]. On arrival, patient was able to be weaned to 4L [place] with O2 sat in mid-90's; otherwise she was afebrile, hypertensive and normocardic. On exam, patient had mild wheezing throughout and rhonchi, without appreciable rales. She has LE edema R>L without tenderness or erythema. Her iStat troponin was negative and EKG was unchanged from previous. CBC showed a leukocytosis to 15.8. BMP was remarkable for stable CKD and Cr of 3.38. CXR showed pulmonary congestion vs multifocal pneumonia. In setting of leukocytosis, patient likely to have pneumonia.  Patient initially stated that right leg swelling is new and usually her legs are equal in size and swelling, however on repeat interview with Dr. Tamera Punt, patient states that right leg is always more edematous than left. Patient has D dimer elevation to 1.6 though has CKD which could cause elevation; and also due to her CKD she is not a candidate for CTA so would likely require venous doppler and V/Q scan tomorrow.   She was given one dose of levaquin 750mg  IV for CAP coverage (allergy to penicillin). She was given further duoneb treatment.   Patient to be admitted for CAP and hypoxia for unassigned. Patient discussed with Dr. Lorin Mercy with [facility].   Final Clinical Impressions(s) / ED Diagnoses   Final diagnoses:  Hypoxia   New Prescriptions New Prescriptions   No medications on file     Alphonzo Grieve, MD 03/26/16 0175    Malvin Johns, MD 03/26/16 1409

## 2016-03-25 NOTE — ED Triage Notes (Addendum)
Pt presents to the ed with complaints of shortness of breath x  5 days that is worse today, she has been on antibiotics for 5 days for bronchitis and has been using her inhaler with no relief, patient presents with audible wheezing and labored respirations, on RA she is 70%, presents on non-rebreather at 100%, patient now on 4L [place] at 95%, she received 10 mg of albuterol and one dose of atrovent in route with ems

## 2016-03-26 ENCOUNTER — Inpatient Hospital Stay (HOSPITAL_COMMUNITY): Payer: Medicare Other

## 2016-03-26 ENCOUNTER — Encounter (HOSPITAL_COMMUNITY): Payer: Self-pay | Admitting: Internal Medicine

## 2016-03-26 DIAGNOSIS — N179 Acute kidney failure, unspecified: Secondary | ICD-10-CM | POA: Diagnosis not present

## 2016-03-26 DIAGNOSIS — R0602 Shortness of breath: Secondary | ICD-10-CM | POA: Diagnosis not present

## 2016-03-26 DIAGNOSIS — N185 Chronic kidney disease, stage 5: Secondary | ICD-10-CM

## 2016-03-26 DIAGNOSIS — E1151 Type 2 diabetes mellitus with diabetic peripheral angiopathy without gangrene: Secondary | ICD-10-CM | POA: Diagnosis present

## 2016-03-26 DIAGNOSIS — R079 Chest pain, unspecified: Secondary | ICD-10-CM | POA: Diagnosis not present

## 2016-03-26 DIAGNOSIS — I5043 Acute on chronic combined systolic (congestive) and diastolic (congestive) heart failure: Secondary | ICD-10-CM | POA: Diagnosis present

## 2016-03-26 DIAGNOSIS — F419 Anxiety disorder, unspecified: Secondary | ICD-10-CM | POA: Diagnosis present

## 2016-03-26 DIAGNOSIS — N184 Chronic kidney disease, stage 4 (severe): Secondary | ICD-10-CM | POA: Diagnosis not present

## 2016-03-26 DIAGNOSIS — I509 Heart failure, unspecified: Secondary | ICD-10-CM | POA: Diagnosis not present

## 2016-03-26 DIAGNOSIS — E1122 Type 2 diabetes mellitus with diabetic chronic kidney disease: Secondary | ICD-10-CM | POA: Diagnosis present

## 2016-03-26 DIAGNOSIS — I132 Hypertensive heart and chronic kidney disease with heart failure and with stage 5 chronic kidney disease, or end stage renal disease: Secondary | ICD-10-CM | POA: Diagnosis present

## 2016-03-26 DIAGNOSIS — Z5321 Procedure and treatment not carried out due to patient leaving prior to being seen by health care provider: Secondary | ICD-10-CM | POA: Diagnosis not present

## 2016-03-26 DIAGNOSIS — M79609 Pain in unspecified limb: Secondary | ICD-10-CM | POA: Diagnosis not present

## 2016-03-26 DIAGNOSIS — Z880 Allergy status to penicillin: Secondary | ICD-10-CM | POA: Diagnosis not present

## 2016-03-26 DIAGNOSIS — D638 Anemia in other chronic diseases classified elsewhere: Secondary | ICD-10-CM

## 2016-03-26 DIAGNOSIS — K219 Gastro-esophageal reflux disease without esophagitis: Secondary | ICD-10-CM | POA: Diagnosis present

## 2016-03-26 DIAGNOSIS — E1129 Type 2 diabetes mellitus with other diabetic kidney complication: Secondary | ICD-10-CM | POA: Diagnosis not present

## 2016-03-26 DIAGNOSIS — Z833 Family history of diabetes mellitus: Secondary | ICD-10-CM | POA: Diagnosis not present

## 2016-03-26 DIAGNOSIS — Z7982 Long term (current) use of aspirin: Secondary | ICD-10-CM | POA: Diagnosis not present

## 2016-03-26 DIAGNOSIS — I248 Other forms of acute ischemic heart disease: Secondary | ICD-10-CM | POA: Diagnosis present

## 2016-03-26 DIAGNOSIS — E1159 Type 2 diabetes mellitus with other circulatory complications: Secondary | ICD-10-CM | POA: Diagnosis present

## 2016-03-26 DIAGNOSIS — I252 Old myocardial infarction: Secondary | ICD-10-CM | POA: Diagnosis not present

## 2016-03-26 DIAGNOSIS — E039 Hypothyroidism, unspecified: Secondary | ICD-10-CM | POA: Diagnosis present

## 2016-03-26 DIAGNOSIS — Z8249 Family history of ischemic heart disease and other diseases of the circulatory system: Secondary | ICD-10-CM | POA: Diagnosis not present

## 2016-03-26 DIAGNOSIS — J9601 Acute respiratory failure with hypoxia: Secondary | ICD-10-CM | POA: Diagnosis present

## 2016-03-26 DIAGNOSIS — J45909 Unspecified asthma, uncomplicated: Secondary | ICD-10-CM | POA: Diagnosis not present

## 2016-03-26 DIAGNOSIS — Z951 Presence of aortocoronary bypass graft: Secondary | ICD-10-CM | POA: Diagnosis not present

## 2016-03-26 DIAGNOSIS — I2510 Atherosclerotic heart disease of native coronary artery without angina pectoris: Secondary | ICD-10-CM | POA: Diagnosis present

## 2016-03-26 DIAGNOSIS — I69351 Hemiplegia and hemiparesis following cerebral infarction affecting right dominant side: Secondary | ICD-10-CM | POA: Diagnosis not present

## 2016-03-26 DIAGNOSIS — R0902 Hypoxemia: Secondary | ICD-10-CM | POA: Diagnosis not present

## 2016-03-26 DIAGNOSIS — J440 Chronic obstructive pulmonary disease with acute lower respiratory infection: Secondary | ICD-10-CM | POA: Diagnosis present

## 2016-03-26 DIAGNOSIS — Z823 Family history of stroke: Secondary | ICD-10-CM | POA: Diagnosis not present

## 2016-03-26 DIAGNOSIS — I5030 Unspecified diastolic (congestive) heart failure: Secondary | ICD-10-CM | POA: Diagnosis not present

## 2016-03-26 DIAGNOSIS — J189 Pneumonia, unspecified organism: Secondary | ICD-10-CM | POA: Diagnosis present

## 2016-03-26 DIAGNOSIS — Z87891 Personal history of nicotine dependence: Secondary | ICD-10-CM | POA: Diagnosis not present

## 2016-03-26 DIAGNOSIS — Z794 Long term (current) use of insulin: Secondary | ICD-10-CM | POA: Diagnosis not present

## 2016-03-26 DIAGNOSIS — E785 Hyperlipidemia, unspecified: Secondary | ICD-10-CM | POA: Diagnosis present

## 2016-03-26 DIAGNOSIS — I130 Hypertensive heart and chronic kidney disease with heart failure and stage 1 through stage 4 chronic kidney disease, or unspecified chronic kidney disease: Secondary | ICD-10-CM | POA: Diagnosis not present

## 2016-03-26 DIAGNOSIS — R609 Edema, unspecified: Secondary | ICD-10-CM | POA: Diagnosis not present

## 2016-03-26 DIAGNOSIS — Z955 Presence of coronary angioplasty implant and graft: Secondary | ICD-10-CM | POA: Diagnosis not present

## 2016-03-26 DIAGNOSIS — N189 Chronic kidney disease, unspecified: Secondary | ICD-10-CM | POA: Diagnosis not present

## 2016-03-26 LAB — BASIC METABOLIC PANEL
Anion gap: 11 (ref 5–15)
BUN: 33 mg/dL — ABNORMAL HIGH (ref 6–20)
CALCIUM: 8.9 mg/dL (ref 8.9–10.3)
CO2: 25 mmol/L (ref 22–32)
CREATININE: 3.4 mg/dL — AB (ref 0.44–1.00)
Chloride: 102 mmol/L (ref 101–111)
GFR calc non Af Amer: 14 mL/min — ABNORMAL LOW (ref >60)
GFR, EST AFRICAN AMERICAN: 16 mL/min — AB (ref >60)
GLUCOSE: 155 mg/dL — AB (ref 65–99)
Potassium: 3.4 mmol/L — ABNORMAL LOW (ref 3.5–5.1)
Sodium: 138 mmol/L (ref 135–145)

## 2016-03-26 LAB — URINALYSIS, ROUTINE W REFLEX MICROSCOPIC
BILIRUBIN URINE: NEGATIVE
Glucose, UA: 150 mg/dL — AB
Ketones, ur: NEGATIVE mg/dL
NITRITE: NEGATIVE
Specific Gravity, Urine: 1.005 (ref 1.005–1.030)
pH: 7 (ref 5.0–8.0)

## 2016-03-26 LAB — GLUCOSE, CAPILLARY
GLUCOSE-CAPILLARY: 148 mg/dL — AB (ref 65–99)
GLUCOSE-CAPILLARY: 215 mg/dL — AB (ref 65–99)
GLUCOSE-CAPILLARY: 231 mg/dL — AB (ref 65–99)
Glucose-Capillary: 166 mg/dL — ABNORMAL HIGH (ref 65–99)
Glucose-Capillary: 169 mg/dL — ABNORMAL HIGH (ref 65–99)

## 2016-03-26 LAB — CBC WITH DIFFERENTIAL/PLATELET
BASOS PCT: 0 %
Basophils Absolute: 0 10*3/uL (ref 0.0–0.1)
EOS ABS: 0.1 10*3/uL (ref 0.0–0.7)
Eosinophils Relative: 0 %
HCT: 29.7 % — ABNORMAL LOW (ref 36.0–46.0)
Hemoglobin: 9.5 g/dL — ABNORMAL LOW (ref 12.0–15.0)
Lymphocytes Relative: 5 %
Lymphs Abs: 0.6 10*3/uL — ABNORMAL LOW (ref 0.7–4.0)
MCH: 30.6 pg (ref 26.0–34.0)
MCHC: 32 g/dL (ref 30.0–36.0)
MCV: 95.8 fL (ref 78.0–100.0)
MONOS PCT: 3 %
Monocytes Absolute: 0.4 10*3/uL (ref 0.1–1.0)
NEUTROS PCT: 92 %
Neutro Abs: 12.2 10*3/uL — ABNORMAL HIGH (ref 1.7–7.7)
Platelets: 231 10*3/uL (ref 150–400)
RBC: 3.1 MIL/uL — ABNORMAL LOW (ref 3.87–5.11)
RDW: 15.5 % (ref 11.5–15.5)
WBC: 13.4 10*3/uL — ABNORMAL HIGH (ref 4.0–10.5)

## 2016-03-26 LAB — RAPID URINE DRUG SCREEN, HOSP PERFORMED
Amphetamines: NOT DETECTED
Barbiturates: NOT DETECTED
Benzodiazepines: NOT DETECTED
Cocaine: NOT DETECTED
OPIATES: POSITIVE — AB
Tetrahydrocannabinol: NOT DETECTED

## 2016-03-26 LAB — HIV ANTIBODY (ROUTINE TESTING W REFLEX): HIV SCREEN 4TH GENERATION: NONREACTIVE

## 2016-03-26 LAB — EXPECTORATED SPUTUM ASSESSMENT W GRAM STAIN, RFLX TO RESP C

## 2016-03-26 LAB — TROPONIN I
TROPONIN I: 0.14 ng/mL — AB (ref <0.03)
Troponin I: 0.47 ng/mL (ref <0.03)
Troponin I: 0.85 ng/mL (ref <0.03)

## 2016-03-26 LAB — BRAIN NATRIURETIC PEPTIDE: B NATRIURETIC PEPTIDE 5: 2430.6 pg/mL — AB (ref 0.0–100.0)

## 2016-03-26 LAB — EXPECTORATED SPUTUM ASSESSMENT W REFEX TO RESP CULTURE: SPECIAL REQUESTS: NORMAL

## 2016-03-26 LAB — STREP PNEUMONIAE URINARY ANTIGEN: Strep Pneumo Urinary Antigen: NEGATIVE

## 2016-03-26 LAB — PROCALCITONIN: Procalcitonin: 4.81 ng/mL

## 2016-03-26 MED ORDER — TRAZODONE HCL 50 MG PO TABS: 50.0000 mg | ORAL_TABLET | Freq: Every evening | ORAL | Status: DC | PRN

## 2016-03-26 MED ORDER — ISOSORBIDE MONONITRATE ER 60 MG PO TB24
120.0000 mg | ORAL_TABLET | Freq: Every day | ORAL | Status: DC
  Administered 2016-03-26 – 2016-04-01 (×7): 120 mg via ORAL
  Filled 2016-03-26 (×7): qty 2

## 2016-03-26 MED ORDER — ONDANSETRON HCL 4 MG PO TABS: 4.0000 mg | ORAL_TABLET | Freq: Four times a day (QID) | ORAL | Status: DC | PRN

## 2016-03-26 MED ORDER — FAMOTIDINE 20 MG PO TABS
10.0000 mg | ORAL_TABLET | Freq: Two times a day (BID) | ORAL | Status: DC
  Administered 2016-03-26 – 2016-04-01 (×13): 10 mg via ORAL
  Filled 2016-03-26 (×13): qty 1

## 2016-03-26 MED ORDER — SORBITOL 70 % SOLN
30.0000 mL | Status: DC | PRN
  Administered 2016-03-30: 30 mL via ORAL
  Filled 2016-03-26 (×3): qty 30

## 2016-03-26 MED ORDER — POTASSIUM CHLORIDE CRYS ER 20 MEQ PO TBCR
40.0000 meq | EXTENDED_RELEASE_TABLET | Freq: Once | ORAL | Status: AC
  Administered 2016-03-26: 40 meq via ORAL
  Filled 2016-03-26: qty 2

## 2016-03-26 MED ORDER — ONDANSETRON HCL 4 MG/2ML IJ SOLN: 4.0000 mg | Freq: Four times a day (QID) | INTRAMUSCULAR | Status: DC | PRN

## 2016-03-26 MED ORDER — CLOPIDOGREL BISULFATE 75 MG PO TABS
75.0000 mg | ORAL_TABLET | Freq: Every day | ORAL | Status: DC
  Administered 2016-03-26 – 2016-04-01 (×7): 75 mg via ORAL
  Filled 2016-03-26 (×7): qty 1

## 2016-03-26 MED ORDER — ENOXAPARIN SODIUM 30 MG/0.3ML ~~LOC~~ SOLN
30.0000 mg | SUBCUTANEOUS | Status: DC
  Administered 2016-03-26 – 2016-03-31 (×6): 30 mg via SUBCUTANEOUS
  Filled 2016-03-26 (×7): qty 0.3

## 2016-03-26 MED ORDER — INSULIN ASPART 100 UNIT/ML ~~LOC~~ SOLN
0.0000 [IU] | Freq: Three times a day (TID) | SUBCUTANEOUS | Status: DC
  Administered 2016-03-26 (×2): 3 [IU] via SUBCUTANEOUS
  Administered 2016-03-26: 1 [IU] via SUBCUTANEOUS
  Administered 2016-03-27 (×3): 2 [IU] via SUBCUTANEOUS
  Administered 2016-03-28: 1 [IU] via SUBCUTANEOUS
  Administered 2016-03-28 (×2): 2 [IU] via SUBCUTANEOUS
  Administered 2016-03-29: 1 [IU] via SUBCUTANEOUS
  Administered 2016-03-29 – 2016-03-30 (×3): 2 [IU] via SUBCUTANEOUS
  Administered 2016-03-30 – 2016-03-31 (×4): 1 [IU] via SUBCUTANEOUS

## 2016-03-26 MED ORDER — ACETAMINOPHEN-CODEINE #3 300-30 MG PO TABS: 1.0000 | ORAL_TABLET | ORAL | Status: DC | PRN

## 2016-03-26 MED ORDER — HYDROXYZINE HCL 25 MG PO TABS
25.0000 mg | ORAL_TABLET | Freq: Three times a day (TID) | ORAL | Status: DC
  Administered 2016-03-26 – 2016-04-01 (×19): 25 mg via ORAL
  Filled 2016-03-26 (×19): qty 1

## 2016-03-26 MED ORDER — FUROSEMIDE 10 MG/ML IJ SOLN
40.0000 mg | Freq: Once | INTRAMUSCULAR | Status: AC
  Administered 2016-03-26: 40 mg via INTRAVENOUS
  Filled 2016-03-26: qty 4

## 2016-03-26 MED ORDER — FLUTICASONE PROPIONATE 50 MCG/ACT NA SUSP
2.0000 | Freq: Every day | NASAL | Status: DC | PRN
  Filled 2016-03-26: qty 16

## 2016-03-26 MED ORDER — GABAPENTIN 400 MG PO CAPS
400.0000 mg | ORAL_CAPSULE | Freq: Every day | ORAL | Status: DC
  Administered 2016-03-27 – 2016-03-31 (×5): 400 mg via ORAL
  Filled 2016-03-26 (×5): qty 1

## 2016-03-26 MED ORDER — LEVOTHYROXINE SODIUM 25 MCG PO TABS
25.0000 ug | ORAL_TABLET | Freq: Every day | ORAL | Status: DC
  Administered 2016-03-26 – 2016-03-31 (×6): 25 ug via ORAL
  Filled 2016-03-26 (×6): qty 1

## 2016-03-26 MED ORDER — DOCUSATE SODIUM 283 MG RE ENEM
1.0000 | ENEMA | RECTAL | Status: DC | PRN
  Filled 2016-03-26: qty 1

## 2016-03-26 MED ORDER — NEPRO/CARBSTEADY PO LIQD
237.0000 mL | Freq: Three times a day (TID) | ORAL | Status: DC | PRN
  Filled 2016-03-26: qty 237

## 2016-03-26 MED ORDER — ATORVASTATIN CALCIUM 40 MG PO TABS
40.0000 mg | ORAL_TABLET | Freq: Every day | ORAL | Status: DC
  Administered 2016-03-26 – 2016-03-31 (×6): 40 mg via ORAL
  Filled 2016-03-26 (×6): qty 1

## 2016-03-26 MED ORDER — INSULIN ASPART 100 UNIT/ML ~~LOC~~ SOLN
0.0000 [IU] | Freq: Every day | SUBCUTANEOUS | Status: DC
  Administered 2016-03-27: 3 [IU] via SUBCUTANEOUS

## 2016-03-26 MED ORDER — LEVOFLOXACIN 500 MG PO TABS
500.0000 mg | ORAL_TABLET | ORAL | Status: DC
  Administered 2016-03-27 – 2016-03-31 (×3): 500 mg via ORAL
  Filled 2016-03-26 (×5): qty 1

## 2016-03-26 MED ORDER — LEVOFLOXACIN 500 MG PO TABS: 500.0000 mg | ORAL_TABLET | ORAL | Status: DC

## 2016-03-26 MED ORDER — MAGNESIUM CITRATE PO SOLN: 1.0000 | ORAL | Status: DC | PRN

## 2016-03-26 MED ORDER — FUROSEMIDE 10 MG/ML IJ SOLN
60.0000 mg | Freq: Two times a day (BID) | INTRAMUSCULAR | Status: DC
  Administered 2016-03-26 – 2016-03-27 (×2): 60 mg via INTRAVENOUS
  Filled 2016-03-26 (×2): qty 6

## 2016-03-26 MED ORDER — ZOLPIDEM TARTRATE 5 MG PO TABS
5.0000 mg | ORAL_TABLET | Freq: Every evening | ORAL | Status: DC | PRN
  Administered 2016-03-29 – 2016-03-31 (×3): 5 mg via ORAL
  Filled 2016-03-26 (×3): qty 1

## 2016-03-26 MED ORDER — ALBUTEROL SULFATE (2.5 MG/3ML) 0.083% IN NEBU
2.5000 mg | INHALATION_SOLUTION | RESPIRATORY_TRACT | Status: DC | PRN
  Administered 2016-03-26 – 2016-04-01 (×4): 2.5 mg via RESPIRATORY_TRACT
  Filled 2016-03-26 (×4): qty 3

## 2016-03-26 MED ORDER — INSULIN GLARGINE 300 UNIT/ML ~~LOC~~ SOPN: 30.0000 [IU] | PEN_INJECTOR | Freq: Every day | SUBCUTANEOUS | Status: DC

## 2016-03-26 MED ORDER — EZETIMIBE 10 MG PO TABS
10.0000 mg | ORAL_TABLET | Freq: Every day | ORAL | Status: DC
  Administered 2016-03-26 – 2016-04-01 (×7): 10 mg via ORAL
  Filled 2016-03-26 (×7): qty 1

## 2016-03-26 MED ORDER — ACETAMINOPHEN 325 MG PO TABS
650.0000 mg | ORAL_TABLET | Freq: Four times a day (QID) | ORAL | Status: DC | PRN
  Administered 2016-03-29 (×2): 650 mg via ORAL
  Filled 2016-03-26 (×2): qty 2

## 2016-03-26 MED ORDER — ACETAMINOPHEN 650 MG RE SUPP: 650.0000 mg | Freq: Four times a day (QID) | RECTAL | Status: DC | PRN

## 2016-03-26 MED ORDER — TECHNETIUM TO 99M ALBUMIN AGGREGATED
4.0000 | Freq: Once | INTRAVENOUS | Status: AC | PRN
  Administered 2016-03-26: 4 via INTRAVENOUS

## 2016-03-26 MED ORDER — SENNOSIDES-DOCUSATE SODIUM 8.6-50 MG PO TABS
1.0000 | ORAL_TABLET | Freq: Every evening | ORAL | Status: DC | PRN
  Filled 2016-03-26: qty 1

## 2016-03-26 MED ORDER — METOPROLOL SUCCINATE ER 50 MG PO TB24
150.0000 mg | ORAL_TABLET | Freq: Every day | ORAL | Status: DC
  Administered 2016-03-26 – 2016-04-01 (×7): 150 mg via ORAL
  Filled 2016-03-26 (×8): qty 1

## 2016-03-26 MED ORDER — GABAPENTIN 400 MG PO CAPS
400.0000 mg | ORAL_CAPSULE | Freq: Two times a day (BID) | ORAL | Status: DC
  Administered 2016-03-26 (×2): 400 mg via ORAL
  Filled 2016-03-26 (×2): qty 1

## 2016-03-26 MED ORDER — LORATADINE 10 MG PO TABS
10.0000 mg | ORAL_TABLET | Freq: Every day | ORAL | Status: DC
  Administered 2016-03-26 – 2016-04-01 (×7): 10 mg via ORAL
  Filled 2016-03-26 (×7): qty 1

## 2016-03-26 MED ORDER — CAMPHOR-MENTHOL 0.5-0.5 % EX LOTN
1.0000 "application " | TOPICAL_LOTION | Freq: Three times a day (TID) | CUTANEOUS | Status: DC | PRN
  Filled 2016-03-26: qty 222

## 2016-03-26 MED ORDER — AMLODIPINE BESYLATE 10 MG PO TABS
10.0000 mg | ORAL_TABLET | Freq: Every day | ORAL | Status: DC
  Administered 2016-03-26 – 2016-04-01 (×7): 10 mg via ORAL
  Filled 2016-03-26 (×7): qty 1

## 2016-03-26 MED ORDER — TECHNETIUM TC 99M DIETHYLENETRIAME-PENTAACETIC ACID: 30.0000 | Freq: Once | INTRAVENOUS | Status: DC | PRN

## 2016-03-26 MED ORDER — IPRATROPIUM-ALBUTEROL 0.5-2.5 (3) MG/3ML IN SOLN
3.0000 mL | Freq: Three times a day (TID) | RESPIRATORY_TRACT | Status: DC
  Administered 2016-03-27 – 2016-03-28 (×6): 3 mL via RESPIRATORY_TRACT
  Filled 2016-03-26 (×6): qty 3

## 2016-03-26 MED ORDER — NAPHAZOLINE-GLYCERIN 0.012-0.2 % OP SOLN
1.0000 [drp] | Freq: Four times a day (QID) | OPHTHALMIC | Status: DC | PRN
  Filled 2016-03-26: qty 15

## 2016-03-26 MED ORDER — CLONIDINE HCL 0.2 MG PO TABS
0.2000 mg | ORAL_TABLET | Freq: Three times a day (TID) | ORAL | Status: DC
  Administered 2016-03-26 – 2016-04-01 (×19): 0.2 mg via ORAL
  Filled 2016-03-26 (×19): qty 1

## 2016-03-26 MED ORDER — INSULIN GLARGINE 100 UNIT/ML ~~LOC~~ SOLN
25.0000 [IU] | Freq: Every day | SUBCUTANEOUS | Status: DC
  Administered 2016-03-26 – 2016-03-31 (×7): 25 [IU] via SUBCUTANEOUS
  Filled 2016-03-26 (×8): qty 0.25

## 2016-03-26 MED ORDER — ASPIRIN 81 MG PO CHEW
81.0000 mg | CHEWABLE_TABLET | Freq: Every day | ORAL | Status: DC
  Administered 2016-03-26 – 2016-04-01 (×7): 81 mg via ORAL
  Filled 2016-03-26 (×7): qty 1

## 2016-03-26 MED ORDER — BENZONATATE 100 MG PO CAPS
100.0000 mg | ORAL_CAPSULE | Freq: Three times a day (TID) | ORAL | Status: DC | PRN
  Administered 2016-03-26 – 2016-03-30 (×4): 100 mg via ORAL
  Filled 2016-03-26 (×4): qty 1

## 2016-03-26 MED ORDER — LACTATED RINGERS IV SOLN: INTRAVENOUS | Status: DC

## 2016-03-26 MED ORDER — HYDROXYZINE HCL 25 MG PO TABS: 25.0000 mg | ORAL_TABLET | Freq: Three times a day (TID) | ORAL | Status: DC | PRN

## 2016-03-26 MED ORDER — CALCIUM CARBONATE ANTACID 1250 MG/5ML PO SUSP
500.0000 mg | Freq: Four times a day (QID) | ORAL | Status: DC | PRN
  Filled 2016-03-26: qty 5

## 2016-03-26 MED ORDER — ALLOPURINOL 300 MG PO TABS
300.0000 mg | ORAL_TABLET | Freq: Every evening | ORAL | Status: DC
Start: 2016-03-26 — End: 2016-04-01
  Administered 2016-03-26 – 2016-03-31 (×6): 300 mg via ORAL
  Filled 2016-03-26 (×6): qty 1

## 2016-03-26 MED ORDER — FUROSEMIDE 10 MG/ML IJ SOLN
40.0000 mg | Freq: Two times a day (BID) | INTRAMUSCULAR | Status: DC
  Administered 2016-03-26: 40 mg via INTRAVENOUS
  Filled 2016-03-26: qty 4

## 2016-03-26 MED ORDER — IPRATROPIUM-ALBUTEROL 0.5-2.5 (3) MG/3ML IN SOLN
3.0000 mL | Freq: Four times a day (QID) | RESPIRATORY_TRACT | Status: DC
  Administered 2016-03-26 (×4): 3 mL via RESPIRATORY_TRACT
  Filled 2016-03-26 (×4): qty 3
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
## 2016-03-26 NOTE — Progress Notes (Addendum)
PROGRESS NOTE  Brooke Stafford EZM:[id number] DOB: [birth year] DOA: 03/25/2016 PCP: Angelica Chessman, MD  Brief History:  60 year old female with history of coronary artery disease, COPD, stroke, diabetes mellitus, CKD stage IV presented with 1 month history of nonproductive cough and shortness of breath that has worsened significantly in the past 3-4 days. The patient has had numerous ED visits for similar symptoms. On 02/24/2016, the patient was given Tessalon Perles and azithromycin for bronchitis. The patient previous to the emergency department on 03/20/2016. The patient was given a dose of intravenous furosemide and discharged home with increased dose of Lasix--60 mg a.m., 40 mg p.m. unfortunately, the patient continues to complain of worsening shortness of breath with orthopnea type symptoms. She also complains of increasing lower extremity edema and increasing abdominal girth as well as orthopnea type symptoms. She endorses compliance with the furosemide, but states that she drinks "plenty of fluid". She has had subjective fevers and chills without any headache, chest pain, abdominal pain, dysuria, hematuria, diarrhea.  Upon presentation, the patient was noted to have WBC 15.8, but was afebrile and hemodynamically stable. She was also hypoxemic with oxygen saturation of 70% when EMS arrived, but was weaned to 4 L nasal cannula in the emergency department. The patient was given furosemide IV and levofloxacin.  Assessment/Plan: Acute respiratory failure with hypoxia -Secondary to CHF exacerbation -Presently stable on 4 L nasal cannula -Wean oxygen for saturation greater than 92%  Acute on chronic systolic and diastolic CHF -50/35/4656 echo EF 40-45%, grade 1 DD -Increase furosemide to 60 mg twice a day -Give additional furosemide 40 mg 1 -Strict I's and O's -Daily weights she states that she has gained 10 pounds over the past week. -review of records show dry weight ~  235 -admission weight 242 -cannot use ACEi due to CKD and has "allergy" to hydralazine  Leukocytosis/RLL Opacity -Likely stress demargination -The patient is afebrile hemodynamically stable -with elevated procalcitonin--4.81, cannot r/o underlying CAP -plan levofloxacin x 5 days -viral respiratory panel -UA--no pyuria  CKD stage IV -The patient has had a progression of her CKD -Baseline creatinine 3.0-3.3 -However, in May 2017 baseline was 2.4-2.7 -Monitor with diuresis  Elevated troponin/ CAD -secondary to demand ischemia from CHF decompensation -no angina -continue ASA and plavix  Diabetes mellitus insulin dependent -continue reduced dose lantus inpatient -novolog ISS -03/15/2016, A1c 5.9  Hypertension -Continue metoprolol succinate, Imdur, clonidine, amlodipine -anticipate improvement with diuresis  COPD -continue bronchodilators -stable -20 pk year hx tobacco  Hyperlipidemia -continue statin  Hypokalemia -replete -check mag  Elevated D-Dimer  -V/Q scan -venous duplex legs   Disposition Plan:   Home in 2-3 days  Family Communication:  No Family at bedside-Total time spent 35 minutes.  Greater than 50% spent face to face counseling and coordinating care. 1050 to 1125   Consultants:  none  Code Status:  FULL  DVT Prophylaxis:  [place] Lovenox   Procedures: As Listed in Progress Note Above  Antibiotics: Levofloxacin 12/12>>>    Subjective: Pt feels dyspnea is a little better.  Still c/o cough without hemoptysis.  No headache, n/v/d, abd pain, dysuria, hematuria, hemoptysis  Objective: Vitals:   03/26/16 0125 03/26/16 0659 03/26/16 0704 03/26/16 1003  BP: (!) 179/75  (!) 172/80   Pulse: 80  72   Resp: 18  18   Temp: 99.6 F (37.6 C)  98.4 F (36.9 C)   TempSrc: Oral  Oral   SpO2: 100%  100%  94%  Weight:  110 kg (242 lb 8.1 oz)    Height:        Intake/Output Summary (Last 24 hours) at 03/26/16 1114 Last data filed at 03/26/16 0900   Gross per 24 hour  Intake              240 ml  Output              950 ml  Net             -710 ml   Weight change:  Exam:   General:  Pt is alert, follows commands appropriately, not in acute distress  HEENT: No icterus, No thrush, No neck mass, [place]/AT  Cardiovascular: RRR, S1/S2, no rubs, no gallops  Respiratory: bilateral crackles R>L  Abdomen: Soft/+BS, non tender, non distended, no guarding  Extremities: 2 + LE edema, No lymphangitis, No petechiae, No rashes, no synovitis   Data Reviewed: I have personally reviewed following labs and imaging studies Basic Metabolic Panel:  Recent Labs Lab 03/20/16 1552 03/25/16 2243 03/26/16 0145  NA 140 137 138  K 3.8 3.6 3.4*  CL 105 101 102  CO2 26 27 25   GLUCOSE 117* 196* 155*  BUN 38* 32* 33*  CREATININE 3.30* 3.38* 3.40*  CALCIUM 8.9 8.9 8.9   Liver Function Tests:  Recent Labs Lab 03/20/16 1552  AST 37  ALT 39  ALKPHOS 141*  BILITOT 0.4  PROT 7.0  ALBUMIN 3.5   No results for input(s): LIPASE, AMYLASE in the last 168 hours. No results for input(s): AMMONIA in the last 168 hours. Coagulation Profile: No results for input(s): INR, PROTIME in the last 168 hours. CBC:  Recent Labs Lab 03/19/16 1257 03/20/16 1552 03/25/16 2243 03/26/16 0145  WBC  --  6.5 15.8* 13.4*  NEUTROABS  --   --   --  12.2*  HGB 9.6* 9.5* 10.1* 9.5*  HCT  --  28.1* 30.7* 29.7*  MCV  --  94.0 95.3 95.8  PLT  --  237 234 231   Cardiac Enzymes:  Recent Labs Lab 03/20/16 1832 03/26/16 0145 03/26/16 0806  TROPONINI 0.03* 0.14* 0.47*   BNP: Invalid input(s): POCBNP CBG:  Recent Labs Lab 03/26/16 0206 03/26/16 0655 03/26/16 1109  GLUCAP 166* 148* 215*   HbA1C: No results for input(s): HGBA1C in the last 72 hours. Urine analysis:    Component Value Date/Time   COLORURINE YELLOW 12/23/2015 1012   APPEARANCEUR CLEAR 12/23/2015 1012   LABSPEC 1.013 12/23/2015 1012   PHURINE 7.0 12/23/2015 1012   GLUCOSEU 100 (A)  12/23/2015 1012   HGBUR MODERATE (A) 12/23/2015 1012   BILIRUBINUR NEGATIVE 12/23/2015 1012   BILIRUBINUR neg 06/02/2013 1540   KETONESUR NEGATIVE 12/23/2015 1012   PROTEINUR >300 (A) 12/23/2015 1012   UROBILINOGEN 0.2 12/14/2014 1700   NITRITE NEGATIVE 12/23/2015 1012   LEUKOCYTESUR NEGATIVE 12/23/2015 1012   Sepsis Labs: @LABRCNTIP (procalcitonin:4,lacticidven:4) )No results found for this or any previous visit (from the past 240 hour(s)).   Scheduled Meds: . allopurinol  300 mg Oral QPM  . amLODipine  10 mg Oral Daily  . aspirin  81 mg Oral Daily  . atorvastatin  40 mg Oral q1800  . cloNIDine  0.2 mg Oral TID  . clopidogrel  75 mg Oral Daily  . enoxaparin (LOVENOX) injection  30 mg Subcutaneous Q24H  . ezetimibe  10 mg Oral Daily  . famotidine  10 mg Oral BID  . furosemide  40 mg Intravenous BID  .  gabapentin  400 mg Oral BID  . hydrOXYzine  25 mg Oral TID  . insulin aspart  0-5 Units Subcutaneous QHS  . insulin aspart  0-9 Units Subcutaneous TID WC  . insulin glargine  25 Units Subcutaneous QHS  . ipratropium-albuterol  3 mL Nebulization Q6H  . isosorbide mononitrate  120 mg Oral Daily  . [START ON 03/27/2016] levofloxacin  500 mg Oral Q48H  . levothyroxine  25 mcg Oral QAC breakfast  . loratadine  10 mg Oral Daily  . metoprolol succinate  150 mg Oral Daily   Continuous Infusions:  Procedures/Studies: Dg Chest Port 1 View  Result Date: 03/25/2016 CLINICAL DATA:  60 y/o  F; shortness of breath worse today. EXAM: PORTABLE CHEST 1 VIEW COMPARISON:  03/20/2016 chest radiograph FINDINGS: A interval increase in interstitial markings and hazy consolidations of the right greater than left lung bases . Stable linear opacities in left mid lung zone probably representing atelectasis/ scarring. Post median sternotomy with wires in alignment and CABG. Stable mild cardiomegaly given projection and technique. Aortic atherosclerosis with arch calcification. IMPRESSION: Interval increase  in interstitial markings and right greater than left lung base hazy consolidations probably represents pulmonary edema, multifocal pneumonia is also possible. Stable cardiomegaly. Electronically Signed   By: Kristine Garbe M.D.   On: 03/25/2016 23:13   Dg Abdomen Acute W/chest  Result Date: 03/20/2016 CLINICAL DATA:  Lower extremity edema 2 days ago with abdominal swelling. EXAM: DG ABDOMEN ACUTE W/ 1V CHEST COMPARISON:  CXR 02/24/2016, abdominal ultrasound from 12/18/2015 FINDINGS: Patient is status post CABG. There is stable cardiomegaly with aortic atherosclerosis. Mild left perihilar scarring. No pneumonic consolidation nor overt pulmonary edema. No pneumothorax nor effusion. Bowel gas pattern is unremarkable. No bowel obstruction or free air. No organomegaly nor suspicious calculi. Bi-common iliac artery atherosclerosis. IMPRESSION: Negative abdominal radiographs. Stable cardiomegaly without acute pulmonary disease. Status post CABG. Electronically Signed   By: Ashley Royalty M.D.   On: 03/20/2016 18:55    Suzanne Kho, DO  [facility] Pager [phone]  If 7PM-7AM, please contact night-coverage [email or web address] Password [email or web address] 03/26/2016, 11:14 AM   LOS: 0 days

## 2016-03-26 NOTE — Progress Notes (Signed)
Called into pt room as pt "could not breathe". Pt 90% on 4L nasal cannula. Pt requesting non-rebreather mask for comfort - coached pt through deep breathing. Pt given scheduled nebulizer and IV lasix. Pt oxygen saturation increased to 98% on 4L nasal cannula. Call light within reach, will continue to monitor.   Fritz Pickerel, RN

## 2016-03-26 NOTE — Progress Notes (Signed)
Clinical Social Worker met patient at bedside to offer support and discuss patients needs at discharge. Patient stated that she has been dealing with mold in her home since September and it has been affecting her health. Patient stated she has spoken to her landlord about her mold issue in the home but landlord has not assisted her in fixing problems. CSW spoke to patient about reaching out to Department of Social Services for [facility] to assist her in funds to try to move out or find new housing. CSW encouraged patient to reach out to [employer] for housing placement. Patient was given DSS phone number and stated she will call today and inform CSW of any updates.  Rhea Pink, MSW,  [place]

## 2016-03-26 NOTE — Progress Notes (Signed)
CRITICAL VALUE ALERT  Critical value received:  Troponin 0.14  Date of notification: 03/26/2016  Time of notification: 03:16  Critical value read back:Yes.    Nurse who received alert: Griffin Dakin   MD notified (1st page):  Forrest Moron NP   Time of first page:  03:20

## 2016-03-27 ENCOUNTER — Inpatient Hospital Stay (HOSPITAL_COMMUNITY): Payer: Medicare Other

## 2016-03-27 DIAGNOSIS — J9601 Acute respiratory failure with hypoxia: Secondary | ICD-10-CM

## 2016-03-27 DIAGNOSIS — M79609 Pain in unspecified limb: Secondary | ICD-10-CM

## 2016-03-27 DIAGNOSIS — R609 Edema, unspecified: Secondary | ICD-10-CM

## 2016-03-27 LAB — RESPIRATORY PANEL BY PCR
ADENOVIRUS-RVPPCR: NOT DETECTED
Bordetella pertussis: NOT DETECTED
CORONAVIRUS NL63-RVPPCR: NOT DETECTED
CORONAVIRUS OC43-RVPPCR: NOT DETECTED
Chlamydophila pneumoniae: NOT DETECTED
Coronavirus 229E: NOT DETECTED
Coronavirus HKU1: NOT DETECTED
INFLUENZA A-RVPPCR: NOT DETECTED
INFLUENZA B-RVPPCR: NOT DETECTED
METAPNEUMOVIRUS-RVPPCR: NOT DETECTED
MYCOPLASMA PNEUMONIAE-RVPPCR: NOT DETECTED
PARAINFLUENZA VIRUS 1-RVPPCR: NOT DETECTED
PARAINFLUENZA VIRUS 2-RVPPCR: NOT DETECTED
PARAINFLUENZA VIRUS 4-RVPPCR: NOT DETECTED
Parainfluenza Virus 3: NOT DETECTED
RESPIRATORY SYNCYTIAL VIRUS-RVPPCR: NOT DETECTED
Rhinovirus / Enterovirus: NOT DETECTED

## 2016-03-27 LAB — URINE CULTURE: Culture: NO GROWTH

## 2016-03-27 LAB — BASIC METABOLIC PANEL
ANION GAP: 7 (ref 5–15)
BUN: 42 mg/dL — AB (ref 6–20)
CALCIUM: 8.7 mg/dL — AB (ref 8.9–10.3)
CO2: 28 mmol/L (ref 22–32)
Chloride: 102 mmol/L (ref 101–111)
Creatinine, Ser: 4.37 mg/dL — ABNORMAL HIGH (ref 0.44–1.00)
GFR calc Af Amer: 12 mL/min — ABNORMAL LOW (ref >60)
GFR, EST NON AFRICAN AMERICAN: 10 mL/min — AB (ref >60)
GLUCOSE: 186 mg/dL — AB (ref 65–99)
POTASSIUM: 4.6 mmol/L (ref 3.5–5.1)
Sodium: 137 mmol/L (ref 135–145)

## 2016-03-27 LAB — CBC
HCT: 24.9 % — ABNORMAL LOW (ref 36.0–46.0)
HEMOGLOBIN: 8.1 g/dL — AB (ref 12.0–15.0)
MCH: 31.2 pg (ref 26.0–34.0)
MCHC: 32.5 g/dL (ref 30.0–36.0)
MCV: 95.8 fL (ref 78.0–100.0)
Platelets: 205 10*3/uL (ref 150–400)
RBC: 2.6 MIL/uL — AB (ref 3.87–5.11)
RDW: 15.2 % (ref 11.5–15.5)
WBC: 8.1 10*3/uL (ref 4.0–10.5)

## 2016-03-27 LAB — GLUCOSE, CAPILLARY
GLUCOSE-CAPILLARY: 256 mg/dL — AB (ref 65–99)
Glucose-Capillary: 154 mg/dL — ABNORMAL HIGH (ref 65–99)
Glucose-Capillary: 196 mg/dL — ABNORMAL HIGH (ref 65–99)
Glucose-Capillary: 164 mg/dL — ABNORMAL HIGH (ref 65–99)

## 2016-03-27 LAB — TROPONIN I: Troponin I: 0.7 ng/mL (ref <0.03)

## 2016-03-27 LAB — MAGNESIUM: Magnesium: 3.1 mg/dL — ABNORMAL HIGH (ref 1.7–2.4)

## 2016-03-27 NOTE — Progress Notes (Signed)
Inpatient Diabetes Program Recommendations  AACE/ADA: New Consensus Statement on Inpatient Glycemic Control (2015)  Target Ranges:  Prepandial:   less than 140 mg/dL      Peak postprandial:   less than 180 mg/dL (1-2 hours)      Critically ill patients:  140 - 180 mg/dL   Lab Results  Component Value Date   GLUCAP 154 (H) 03/27/2016   HGBA1C 5.9 03/15/2016    Review of Glycemic Control:  Results for Brooke Stafford, Brooke Stafford (MRN [id number]) as of 03/27/2016 09:48  Ref. Range 03/26/2016 06:55 03/26/2016 11:09 03/26/2016 17:14 03/26/2016 21:16 03/27/2016 06:52  Glucose-Capillary Latest Ref Range: 65 - 99 mg/dL 148 (H) 215 (H) 231 (H) 169 (H) 154 (H)   Diabetes history: Type 2 diabetes Outpatient Diabetes medications: Toujeo 30 units daily, Novolog 10-20 units tid with meals Current orders for Inpatient glycemic control:  Lantus 25 units q HS, Novolog sensitive tid with meals and HS  Inpatient Diabetes Program Recommendations:   Please consider adding Novolog meal coverage 4 units tid with meals (hold if patient eats less than 50%).  Thanks, Adah Perl, RN, BC-ADM Inpatient Diabetes Coordinator Pager [phone] (8a-5p)

## 2016-03-27 NOTE — Consult Note (Signed)
Brooke Stafford Admit Date: 03/25/2016 03/27/2016 Brooke Stafford Requesting Physician:  Brooke Stafford  Reason for Consult:  AoCKD4 HPI:  52F admit 12/11 for progressive SOB/DOE.    PMH Incudes:  CKD4; sees [facility]; BL SCr around 3 was in process of having AVF eval; presumed related to DM, HTN, ASCVD; last visit 01/2016  ASCVD hx/o 6V CABG + PCI, CVA  DM2  CHF last LVEF 40-45%  Gout  [place]  Since admission pt treated for CAP with oral levofloxacin, increasing doses of diuretics for question of acute CHF exacerbation, COPD on bronchodilators. Today she says she feels markedly improved but still has exertional symptoms.  Has had decent diuresis but SCr went from baseline to 4.4 today.    Has foley catheter.  No ACE or ARB on meds.  Not using NSAIDs.     Creat (mg/dL)  Date Value  08/24/2015 2.36 (H)  12/06/2014 1.49 (H)  11/10/2013 1.47 (H)  06/02/2013 1.67 (H)  11/25/2012 1.64 (H)   Creatinine, Ser (mg/dL)  Date Value  03/27/2016 4.37 (H)  03/26/2016 3.40 (H)  03/25/2016 3.38 (H)  03/20/2016 3.30 (H)  02/24/2016 3.04 (H)  02/20/2016 3.08 (H)  12/23/2015 3.24 (H)  12/22/2015 3.38 (H)  12/21/2015 3.14 (H)  12/20/2015 3.25 (H)  ] I/Os: I/O last 3 completed shifts: In: 69 [P.O.:720] Out: 1901 [Urine:1900; Stool:1]  ROS Balance of 12 systems is negative w/ exceptions as above  PMH  Past Medical History:  Diagnosis Date  . Anemia   . Anginal pain ([place])   . Anxiety   . Arthritis    "stiff fingers and knees" (08/04/2013), (12/12/2014)  . Asthma   . CAD (coronary artery disease) 2002; 2015   CABG x 6 2002, cath 2011- med Rx stent DES VG-Diag  . CAD (coronary artery disease) of artery bypass graft; DES to VG-Diag 09/28/13 11/09/2013  . Cataract   . CHF (congestive heart failure) ([place])    "in 2002" (11/26/2012)  . Chronic bronchitis ([place])    "q year; in the winter"   . CKD (chronic kidney disease)    stage 4, followed by [facility]  . Coronary artery  disease 2002   CABG x 6. Cath 5/11- med Rx  . GERD (gastroesophageal reflux disease)   . Gout    "right big toe"  . Headache    "~ q week" (08/04/2013); "~ twice/month" (12/12/2014)  . History of blood transfusion 2002   "when I had OHS"  . Hyperlipidemia   . Hypertension   . Hypothyroid    treated  . Migraines    "couple times/year" (08/04/2013), (12/12/2014)  . Myocardial infarction 2000; 2002; 2011  . Obesity (BMI 35.0-39.9 without comorbidity)   . Peripheral vascular disease ([place]) 12/12   LSFA PTA  . Pneumonia    "3 times I think" (12/12/2014)  . Type II diabetes mellitus ([facility])    PSH  Past Surgical History:  Procedure Laterality Date  . ABDOMINAL AORTAGRAM N/A 04/05/2011   Procedure: ABDOMINAL AORTAGRAM;  Surgeon: Lorretta Harp, MD;  Location[facility] CATH LAB;  Service: Cardiovascular;  Laterality: N/A;  . APPENDECTOMY  1980  . BREAST CYST EXCISION Right 1970's  . CARDIAC CATHETERIZATION  2002  . CARDIAC CATHETERIZATION N/A 12/12/2014   Procedure: Left Heart Cath and Cors/Grafts Angiography;  Surgeon: Peter M Martinique, MD;  Location: [place] CV LAB;  Service: Cardiovascular;  Laterality: N/A;  . CARDIAC CATHETERIZATION N/A 12/12/2014   Procedure: Coronary Stent Intervention;  Surgeon: [name]  M Martinique, MD;  Location: [place] CV LAB;  Service: Cardiovascular;  Laterality: N/A;  . [place]; 1980  . CHOLECYSTECTOMY  1982  . CORONARY ANGIOPLASTY WITH STENT PLACEMENT  2004; 2012   "I have 2 stents" (08/04/2013)  . CORONARY ANGIOPLASTY WITH STENT PLACEMENT  09/28/13   PTCA/ DES Xience stent to VG-Diag   . CORONARY ARTERY BYPASS GRAFT  11/20/2000   x6 LIMA to distal LAD, svg to first diag, svg to ramus intermediate branch and swquential SVG to cir marginal branch, SVG to posterior descending coronary and sequential SVG to first right posterolateral branch  . LEFT HEART CATHETERIZATION WITH CORONARY/GRAFT ANGIOGRAM N/A 09/28/2013   Procedure: LEFT HEART CATHETERIZATION  WITH Beatrix Fetters;  Surgeon: Peter M Martinique, MD;  Location: [facility] CATH LAB;  Service: Cardiovascular;  Laterality: N/A;  . LOWER EXTREMITY ANGIOGRAM  12/01/2012   Procedure: LOWER EXTREMITY ANGIOGRAM;  Surgeon: Lorretta Harp, MD;  Location: [facility] CATH LAB;  Service: Cardiovascular;;  . NM MYOCAR PERF WALL MOTION  08/27/2004   negative  . PERCUTANEOUS STENT INTERVENTION Left 12/01/2012   Procedure: PERCUTANEOUS STENT INTERVENTION;  Surgeon: Lorretta Harp, MD;  Location: [facility] CATH LAB;  Service: Cardiovascular;  Laterality: Left;  Left SFA  . PERIPHERAL ARTERIAL STENT GRAFT Left    SFA/notes 04/07/2011 (11/30/2012)  . RENAL ANGIOGRAM N/A 04/05/2011   Procedure: RENAL ANGIOGRAM;  Surgeon: Lorretta Harp, MD;  Location: [facility] CATH LAB;  Service: Cardiovascular;  Laterality: N/A;  . TUBAL LIGATION  1980   FH  Family History  Problem Relation Age of Onset  . Diabetes Mother   . Hypertension Mother   . Stroke Mother   . Hypertension Father   . Hypertension Brother   . Hypertension Sister   . Diabetes Sister   . Hyperlipidemia Sister    SH  reports that she quit smoking about 15 years ago. Her smoking use included Cigarettes. She smoked 0.00 packs per day for 25.00 years. She has never used smokeless tobacco. She reports that she does not drink alcohol or use drugs. Allergies  Allergies  Allergen Reactions  . Digoxin And Related Other (See Comments)    Patient stated she almost died. Had flu like symptoms as well as diarrhea.  . Hydralazine Shortness Of Breath  . Lisinopril Other (See Comments)    Felt like she had the flu. Was very sick!!!  . Penicillins Cross Reactors Hives    And high fever  . Adhesive [Tape] Rash    bruising   Home medications Prior to Admission medications   Medication Sig Start Date End Date Taking? Authorizing Provider  acetaminophen-codeine (TYLENOL #3) 300-30 MG tablet Take 1 tablet by mouth every 4 (four) hours as needed for moderate pain. 01/11/16   Yes Tresa Garter, MD  albuterol (PROVENTIL HFA;VENTOLIN HFA) 108 (90 Base) MCG/ACT inhaler Inhale 2 puffs into the lungs every 6 (six) hours as needed for wheezing or shortness of breath. 09/28/15  Yes Tresa Garter, MD  allopurinol (ZYLOPRIM) 300 MG tablet Take 1 tablet (300 mg total) by mouth daily. Patient taking differently: Take 300 mg by mouth every evening.  02/22/16  Yes Tresa Garter, MD  amLODipine (NORVASC) 10 MG tablet take 1 tablet by mouth once daily Patient taking differently: Take 10 mg by mouth daily 11/20/15  Yes Mihai Croitoru, MD  aspirin 81 MG chewable tablet Chew 1 tablet (81 mg total) by mouth daily. 02/24/15  Yes Mihai Croitoru, MD  atorvastatin (  LIPITOR) 40 MG tablet Take 1 tablet (40 mg total) by mouth daily. Patient taking differently: Take 40 mg by mouth daily at 6 PM.  07/28/15  Yes Ripudeep Krystal Eaton, MD  benzonatate (TESSALON) 100 MG capsule Take 1 capsule (100 mg total) by mouth 3 (three) times daily as needed for cough. 03/21/16  Yes Tresa Garter, MD  cloNIDine (CATAPRES) 0.2 MG tablet Take 1 tablet (0.2 mg total) by mouth 3 (three) times daily. 02/22/16  Yes Tresa Garter, MD  clopidogrel (PLAVIX) 75 MG tablet Take 1 tablet (75 mg total) by mouth daily. 09/25/15  Yes Mihai Croitoru, MD  ezetimibe (ZETIA) 10 MG tablet Take 1 tablet (10 mg total) by mouth daily. 01/11/16  Yes Tresa Garter, MD  fluticasone (FLONASE) 50 MCG/ACT nasal spray Place 2 sprays into both nostrils daily as needed for allergies or rhinitis. 09/28/15  Yes Tresa Garter, MD  furosemide (LASIX) 20 MG tablet Take a total of 60 mg in the morning and 40 mg at night. 03/21/16  Yes Tresa Garter, MD  gabapentin (NEURONTIN) 400 MG capsule take 1 capsule by mouth three times a day Patient taking differently: take 1 capsule by mouth two times a day 01/24/16  Yes Tresa Garter, MD  hydrOXYzine (ATARAX/VISTARIL) 25 MG tablet Take 1 tablet (25 mg total) by mouth 3  (three) times daily. 03/21/16  Yes Olugbemiga Essie Christine, MD  insulin aspart (NOVOLOG FLEXPEN) 100 UNIT/ML FlexPen Inject 10-30 units into the skin 3 times daily, plus sliding scale as instructed. Patient taking differently: Inject 10-20 Units into the skin 3 (three) times daily with meals. Inject 10-30 units into the skin 3 times daily, plus sliding scale as instructed. 12/14/15  Yes Philemon Kingdom, MD  Insulin Glargine (TOUJEO SOLOSTAR) 300 UNIT/ML SOPN Inject 35 Units into the skin at bedtime. Patient taking differently: Inject 30 Units into the skin at bedtime.  03/15/16  Yes Philemon Kingdom, MD  isosorbide mononitrate (IMDUR) 60 MG 24 hr tablet Take 2 tablets (120 mg total) by mouth daily. 02/22/16  Yes Tresa Garter, MD  loratadine (CLARITIN) 10 MG tablet Take 1 tablet (10 mg total) by mouth daily as needed for allergies. Patient taking differently: Take 10 mg by mouth daily.  12/15/14  Yes Luke K Kilroy, PA-C  magnesium citrate SOLN Take 296 mLs (1 Bottle total) by mouth as needed for severe constipation. 07/28/15  Yes Ripudeep Krystal Eaton, MD  metoprolol succinate (TOPROL-XL) 50 MG 24 hr tablet Take 3 tablets (150 mg total) by mouth daily. Take with or immediately following a meal. 03/08/16  Yes Rhonda G Barrett, PA-C  Naphazoline HCl (CLEAR EYES OP) Place 1 drop into both eyes as needed (for dry eyes).   Yes Historical Provider, MD  nitroGLYCERIN (NITROSTAT) 0.4 MG SL tablet Place 1 tablet (0.4 mg total) under the tongue every 5 (five) minutes as needed. For chest 11/10/14  Yes Olugbemiga E Doreene Burke, MD  ranitidine (ZANTAC 75) 75 MG tablet Take 1 tablet (75 mg total) by mouth 2 (two) times daily. Patient taking differently: Take 75 mg by mouth 2 (two) times daily as needed for heartburn.  07/28/15  Yes Ripudeep Krystal Eaton, MD  senna-docusate (SENOKOT-S) 8.6-50 MG tablet Take 1 tablet by mouth at bedtime as needed for mild constipation. 12/23/15  Yes Virgel Manifold, MD  simethicone (MYLICON) 950 MG chewable  tablet Chew 125 mg by mouth every 6 (six) hours as needed for flatulence.   Yes Historical Provider, MD  traZODone (DESYREL) 50 MG tablet Take 1 tablet (50 mg total) by mouth at bedtime as needed for sleep. 03/21/16  Yes Tresa Garter, MD  triamcinolone ointment (KENALOG) 0.5 % Apply 1 application topically 2 (two) times daily. Patient taking differently: Apply 1 application topically daily as needed (for skin).  08/24/15  Yes Tresa Garter, MD  levothyroxine (SYNTHROID, LEVOTHROID) 25 MCG tablet take 1 tablet by mouth every morning BEFORE BREAKFAST 03/26/16   Tresa Garter, MD    Current Medications Scheduled Meds: . allopurinol  300 mg Oral QPM  . amLODipine  10 mg Oral Daily  . aspirin  81 mg Oral Daily  . atorvastatin  40 mg Oral q1800  . cloNIDine  0.2 mg Oral TID  . clopidogrel  75 mg Oral Daily  . enoxaparin (LOVENOX) injection  30 mg Subcutaneous Q24H  . ezetimibe  10 mg Oral Daily  . famotidine  10 mg Oral BID  . gabapentin  400 mg Oral QHS  . hydrOXYzine  25 mg Oral TID  . insulin aspart  0-5 Units Subcutaneous QHS  . insulin aspart  0-9 Units Subcutaneous TID WC  . insulin glargine  25 Units Subcutaneous QHS  . ipratropium-albuterol  3 mL Nebulization TID  . isosorbide mononitrate  120 mg Oral Daily  . levofloxacin  500 mg Oral Q48H  . levothyroxine  25 mcg Oral QAC breakfast  . loratadine  10 mg Oral Daily  . metoprolol succinate  150 mg Oral Daily   Continuous Infusions: PRN Meds:.acetaminophen **OR** acetaminophen, acetaminophen-codeine, albuterol, benzonatate, calcium carbonate (dosed in mg elemental calcium), camphor-menthol **AND** [DISCONTINUED] hydrOXYzine, docusate sodium, feeding supplement (NEPRO CARB STEADY), fluticasone, naphazoline-glycerin, ondansetron **OR** ondansetron (ZOFRAN) IV, senna-docusate, sorbitol, technetium TC 51M diethylenetriame-pentaacetic acid, traZODone, zolpidem  CBC  Recent Labs Lab 03/25/16 2243 03/26/16 0145  03/27/16 0147  WBC 15.8* 13.4* 8.1  NEUTROABS  --  12.2*  --   HGB 10.1* 9.5* 8.1*  HCT 30.7* 29.7* 24.9*  MCV 95.3 95.8 95.8  PLT 234 231 229   Basic Metabolic Panel  Recent Labs Lab 03/20/16 1552 03/25/16 2243 03/26/16 0145 03/27/16 0147  NA 140 137 138 137  K 3.8 3.6 3.4* 4.6  CL 105 101 102 102  CO2 26 27 25 28   GLUCOSE 117* 196* 155* 186*  BUN 38* 32* 33* 42*  CREATININE 3.30* 3.38* 3.40* 4.37*  CALCIUM 8.9 8.9 8.9 8.7*    Physical Exam  Blood pressure (!) 143/66, pulse 72, temperature 99 F (37.2 C), temperature source Oral, resp. rate 17, height 5' 10.5" (1.791 m), weight 109.9 kg (242 lb 4.8 oz), last menstrual period 10/13/2014, SpO2 97 %. GEN: NAD, sititng in chair in NAD ENT: NCAT EYES: EOMI CV: RRR, PULM: nl WOB.   ABD: s/nt/nd SKIN: no rashes/lesons NLG:XQJJH [name]   Assessment 89F with AoCKD4 likely from overdiuresis with progressive CKD, numerous comorbidities  1. AoCKD4; BL SCr aroudn 3; [facility] 2. CAP 3. Acute S/D CHF Exacerbation s/p diuretics 4. COPD 5. ASCVD hx/o CAD/CABG 6. Obesity 7. DM2 8. HTN  Plan 1. Hold diuretics 2. Cont moderate fluid restriction 3. Daily weights, Daily Renal Panel, Strict I/Os, Avoid nephrotoxins (NSAIDs, judicious IV Contrast)    Pearson Grippe MD [phone] pgr 03/27/2016, 3:43 PM

## 2016-03-27 NOTE — Progress Notes (Signed)
*  PRELIMINARY RESULTS* Vascular Ultrasound Bilateral lower extremity venous duplex has been completed.  Preliminary findings: No evidence of deep vein thrombosis in visualized veins, negative for baker's cysts bilaterally. Everrett Coombe 03/27/2016, 10:29 AM

## 2016-03-27 NOTE — Progress Notes (Signed)
PROGRESS NOTE  Brooke Stafford EZM:[id number] DOB: Jul 02, 1955 DOA: 03/25/2016 PCP: Angelica Chessman, MD   LOS: 1 day   Brief Narrative: 60 year old female with history of coronary artery disease, COPD, stroke, diabetes mellitus, CKD stage IV presented with 1 month history of nonproductive cough and shortness of breath that has worsened significantly in the past 3-4 days. The patient has had numerous ED visits for similar symptoms. On 02/24/2016, the patient was given Tessalon Perles and azithromycin for bronchitis. The patient previous to the emergency department on 03/20/2016. The patient was given a dose of intravenous furosemide and discharged home with increased dose of Lasix--60 mg a.m., 40 mg p.m. unfortunately, the patient continues to complain of worsening shortness of breath with orthopnea type symptoms. She also complains of increasing lower extremity edema and increasing abdominal girth as well as orthopnea type symptoms. She endorses compliance with the furosemide, but states that she drinks "plenty of fluid". She has had subjective fevers and chills without any headache, chest pain, abdominal pain, dysuria, hematuria, diarrhea.  Upon presentation, the patient was noted to have WBC 15.8, but was afebrile and hemodynamically stable. She was also hypoxemic with oxygen saturation of 70% when EMS arrived, but was weaned to 4 L nasal cannula in the emergency department. The patient was given furosemide IV and levofloxacin.  Assessment & Plan: Principal Problem:   Acute respiratory failure with hypoxia (HCC) Active Problems:   Hypothyroidism   CKD (chronic kidney disease) stage 5, GFR less than 15 ml/min (HCC)   Essential hypertension   Chronic combined systolic and diastolic congestive heart failure (HCC)   Diabetes mellitus with circulatory complication, with long-term current use of insulin (HCC)   Anemia of chronic disease   CAP (community acquired pneumonia)   Acute on chronic  combined systolic and diastolic CHF (congestive heart failure) (HCC)   Chronic kidney disease (CKD), stage IV (severe) (HCC)   Acute respiratory failure with hypoxia - Secondary to CHF exacerbation - Presently stable on nasal cannula - Wean oxygen for saturation greater than 92%  Acute on chronic systolic and diastolic CHF - 50/35/4656 echo EF 40-45%, grade 1 DD - continue furosemide to 60 mg twice a day - On 12/12 she received furosemide 40 mg 2 as well as 60 mg x 1 - Strict I's and O's - Daily weights she states that she has gained 10 pounds over the past week, weight unchanged since yesterday, will place patient on fluid restriction - review of records show dry weight ~ 235 - admission weight 242 - cannot use ACEi due to CKD and has "allergy" to hydralazine  Leukocytosis/RLL Opacity - Likely stress demargination - The patient is afebrile hemodynamically stable - with elevated procalcitonin--4.81, cannot r/o underlying CAP - plan levofloxacin x 5 days - viral respiratory panel - UA--no pyuria  CKD stage IV - The patient has had a progression of her CKD, her creatinine increased from baseline of 3.4 yesterday to 4.4 today after Lasix, and she hasn't lost weight and clinically looks fluid overloaded, I have consulted nephrology, appreciate input - However, in May 2017 baseline was 2.4-2.7  Elevated troponin/ CAD - secondary to demand ischemia from CHF decompensation, elevation not in a pattern consistent with ACS - no angina - continue ASA and plavix  Diabetes mellitus insulin dependent - continue reduced dose lantus inpatient - novolog ISS - 03/15/2016, A1c 5.9  Hypertension - Continue metoprolol succinate, Imdur, clonidine, amlodipine - anticipate improvement with diuresis  COPD - continue bronchodilators - stable -  20 pk year hx tobacco  Hyperlipidemia - continue statin  Hypokalemia - replete - check mag  Elevated D-Dimer  - V/Q scan negative -  venous duplex legs negative for DVT   DVT prophylaxis: Lovenox Code Status: Full code Family Communication: no family bedside Disposition Plan: home when ready   Consultants:   Nephrology   Procedures:   None   Antimicrobials:  Levaquin 12/12 >>   Subjective: - no chest pain, no abdominal pain, nausea or vomiting.  - Complex of ongoing mild shortness of breath  Objective: Vitals:   03/26/16 2119 03/27/16 0636 03/27/16 0829 03/27/16 1000  BP: 140/61 (!) 146/112    Pulse: 68 73    Resp: 18     Temp: 99.2 F (37.3 C) 99.3 F (37.4 C)    TempSrc: Oral Oral    SpO2: 100% 98% 99% 98%  Weight:  109.9 kg (242 lb 4.8 oz)    Height:        Intake/Output Summary (Last 24 hours) at 03/27/16 1258 Last data filed at 03/27/16 1129  Gross per 24 hour  Intake              720 ml  Output             1650 ml  Net             -930 ml   Filed Weights   03/26/16 0100 03/26/16 0659 03/27/16 0636  Weight: 109.9 kg (242 lb 4.6 oz) 110 kg (242 lb 8.1 oz) 109.9 kg (242 lb 4.8 oz)    Examination: Constitutional: NAD Vitals:   03/26/16 2119 03/27/16 0636 03/27/16 0829 03/27/16 1000  BP: 140/61 (!) 146/112    Pulse: 68 73    Resp: 18     Temp: 99.2 F (37.3 C) 99.3 F (37.4 C)    TempSrc: Oral Oral    SpO2: 100% 98% 99% 98%  Weight:  109.9 kg (242 lb 4.8 oz)    Height:       Eyes: PERRL, lids and conjunctivae normal ENMT: Mucous membranes are moist. No oropharyngeal exudates Respiratory: clear to auscultation bilaterally, no wheezing, no crackles. Normal respiratory effort. No accessory muscle use.  Cardiovascular: Regular rate and rhythm, no murmurs / rubs / gallops. 1-2 + pitting LE edema. 2+ pedal pulses. +JVD Abdomen: no tenderness. Bowel sounds positive.  Neurologic: non focal    Data Reviewed: I have personally reviewed following labs and imaging studies  CBC:  Recent Labs Lab 03/20/16 1552 03/25/16 2243 03/26/16 0145 03/27/16 0147  WBC 6.5 15.8* 13.4* 8.1    NEUTROABS  --   --  12.2*  --   HGB 9.5* 10.1* 9.5* 8.1*  HCT 28.1* 30.7* 29.7* 24.9*  MCV 94.0 95.3 95.8 95.8  PLT 237 234 231 932   Basic Metabolic Panel:  Recent Labs Lab 03/20/16 1552 03/25/16 2243 03/26/16 0145 03/27/16 0147  NA 140 137 138 137  K 3.8 3.6 3.4* 4.6  CL 105 101 102 102  CO2 26 27 25 28   GLUCOSE 117* 196* 155* 186*  BUN 38* 32* 33* 42*  CREATININE 3.30* 3.38* 3.40* 4.37*  CALCIUM 8.9 8.9 8.9 8.7*  MG  --   --   --  3.1*   GFR: Estimated Creatinine Clearance: 18.3 mL/min (by C-G formula based on SCr of 4.37 mg/dL (H)). Liver Function Tests:  Recent Labs Lab 03/20/16 1552  AST 37  ALT 39  ALKPHOS 141*  BILITOT 0.4  PROT 7.0  ALBUMIN 3.5   No results for input(s): LIPASE, AMYLASE in the last 168 hours. No results for input(s): AMMONIA in the last 168 hours. Coagulation Profile: No results for input(s): INR, PROTIME in the last 168 hours. Cardiac Enzymes:  Recent Labs Lab 03/20/16 1832 03/26/16 0145 03/26/16 0806 03/26/16 1524 03/27/16 0847  TROPONINI 0.03* 0.14* 0.47* 0.85* 0.70*   BNP (last 3 results) No results for input(s): PROBNP in the last 8760 hours. HbA1C: No results for input(s): HGBA1C in the last 72 hours. CBG:  Recent Labs Lab 03/26/16 1109 03/26/16 1714 03/26/16 2116 03/27/16 0652 03/27/16 1114  GLUCAP 215* 231* 169* 154* 196*   Lipid Profile: No results for input(s): CHOL, HDL, LDLCALC, TRIG, CHOLHDL, LDLDIRECT in the last 72 hours. Thyroid Function Tests: No results for input(s): TSH, T4TOTAL, FREET4, T3FREE, THYROIDAB in the last 72 hours. Anemia Panel: No results for input(s): VITAMINB12, FOLATE, FERRITIN, TIBC, IRON, RETICCTPCT in the last 72 hours. Urine analysis:    Component Value Date/Time   COLORURINE YELLOW 03/26/2016 [place] 03/26/2016 1232   LABSPEC 1.005 03/26/2016 1232   PHURINE 7.0 03/26/2016 1232   GLUCOSEU 150 (A) 03/26/2016 1232   HGBUR MODERATE (A) 03/26/2016 [place] 03/26/2016 1232   BILIRUBINUR neg 06/02/2013 1540   KETONESUR NEGATIVE 03/26/2016 1232   PROTEINUR >=300 (A) 03/26/2016 1232   UROBILINOGEN 0.2 12/14/2014 1700   NITRITE NEGATIVE 03/26/2016 1232   LEUKOCYTESUR TRACE (A) 03/26/2016 1232   Sepsis Labs: Invalid input(s): PROCALCITONIN, LACTICIDVEN  Recent Results (from the past 240 hour(s))  Culture, sputum-assessment     Status: None   Collection Time: 03/26/16 12:32 PM  Result Value Ref Range Status   Specimen Description SPUTUM  Final   Special Requests Normal  Final   Sputum evaluation   Final    MICROSCOPIC FINDINGS SUGGEST THAT THIS SPECIMEN IS NOT REPRESENTATIVE OF LOWER RESPIRATORY SECRETIONS. PLEASE RECOLLECT. Results Called to: RN [facility]  [phone] MLM    Report Status 03/26/2016 FINAL  Final  Urine culture     Status: None   Collection Time: 03/26/16 12:32 PM  Result Value Ref Range Status   Specimen Description URINE, CATHETERIZED  Final   Special Requests NONE  Final   Culture NO GROWTH  Final   Report Status 03/27/2016 FINAL  Final  Respiratory Panel by PCR     Status: None   Collection Time: 03/26/16 10:40 PM  Result Value Ref Range Status   Adenovirus NOT DETECTED NOT DETECTED Final   Coronavirus 229E NOT DETECTED NOT DETECTED Final   Coronavirus HKU1 NOT DETECTED NOT DETECTED Final   Coronavirus NL63 NOT DETECTED NOT DETECTED Final   Coronavirus OC43 NOT DETECTED NOT DETECTED Final   Metapneumovirus NOT DETECTED NOT DETECTED Final   Rhinovirus / Enterovirus NOT DETECTED NOT DETECTED Final   Influenza A NOT DETECTED NOT DETECTED Final   Influenza B NOT DETECTED NOT DETECTED Final   Parainfluenza Virus 1 NOT DETECTED NOT DETECTED Final   Parainfluenza Virus 2 NOT DETECTED NOT DETECTED Final   Parainfluenza Virus 3 NOT DETECTED NOT DETECTED Final   Parainfluenza Virus 4 NOT DETECTED NOT DETECTED Final   Respiratory Syncytial Virus NOT DETECTED NOT DETECTED Final   Bordetella  pertussis NOT DETECTED NOT DETECTED Final   Chlamydophila pneumoniae NOT DETECTED NOT DETECTED Final   Mycoplasma pneumoniae NOT DETECTED NOT DETECTED Final    Radiology Studies: Nm Pulmonary Perf And Vent  Result Date: 03/26/2016 CLINICAL DATA:  Elevated D-dimer, shortness of breath EXAM: NUCLEAR MEDICINE VENTILATION - PERFUSION LUNG SCAN TECHNIQUE: Ventilation images were obtained in multiple projections using inhaled aerosol Tc-53m DTPA. Perfusion images were obtained in multiple projections after intravenous injection of Tc-48m MAA. RADIOPHARMACEUTICALS:  30.5 mCi Technetium-63m DTPA aerosol inhalation and 3.9 mCi Technetium-66m MAA IV COMPARISON:  Radiograph 03/25/2016 FINDINGS: Ventilation: No focal ventilation defect. Perfusion: No wedge shaped peripheral perfusion defects to suggest acute pulmonary embolism. There is cardiomegaly IMPRESSION: Negative examination Electronically Signed   By: Donavan Foil M.D.   On: 03/26/2016 21:25   Dg Chest Port 1 View  Result Date: 03/27/2016 CLINICAL DATA:  Shortness of breath, dyspnea EXAM: PORTABLE CHEST 1 VIEW COMPARISON:  03/25/2016 FINDINGS: Cardiomegaly again noted. Status post CABG. Residual mild interstitial prominence bilaterally probable improving mild interstitial edema. Improvement in aeration right base. Persistent residual atelectasis or infiltrate left base. IMPRESSION: Residual mild interstitial prominence bilaterally probable improving mild interstitial edema. Improvement in aeration right base. Persistent residual atelectasis or infiltrate left base. Electronically Signed   By: Lahoma Crocker M.D.   On: 03/27/2016 08:41   Dg Chest Port 1 View  Result Date: 03/25/2016 CLINICAL DATA:  60 y/o  F; shortness of breath worse today. EXAM: PORTABLE CHEST 1 VIEW COMPARISON:  03/20/2016 chest radiograph FINDINGS: A interval increase in interstitial markings and hazy consolidations of the right greater than left lung bases . Stable linear opacities in  left mid lung zone probably representing atelectasis/ scarring. Post median sternotomy with wires in alignment and CABG. Stable mild cardiomegaly given projection and technique. Aortic atherosclerosis with arch calcification. IMPRESSION: Interval increase in interstitial markings and right greater than left lung base hazy consolidations probably represents pulmonary edema, multifocal pneumonia is also possible. Stable cardiomegaly. Electronically Signed   By: Kristine Garbe M.D.   On: 03/25/2016 23:13   Scheduled Meds: . allopurinol  300 mg Oral QPM  . amLODipine  10 mg Oral Daily  . aspirin  81 mg Oral Daily  . atorvastatin  40 mg Oral q1800  . cloNIDine  0.2 mg Oral TID  . clopidogrel  75 mg Oral Daily  . enoxaparin (LOVENOX) injection  30 mg Subcutaneous Q24H  . ezetimibe  10 mg Oral Daily  . famotidine  10 mg Oral BID  . furosemide  60 mg Intravenous BID  . gabapentin  400 mg Oral QHS  . hydrOXYzine  25 mg Oral TID  . insulin aspart  0-5 Units Subcutaneous QHS  . insulin aspart  0-9 Units Subcutaneous TID WC  . insulin glargine  25 Units Subcutaneous QHS  . ipratropium-albuterol  3 mL Nebulization TID  . isosorbide mononitrate  120 mg Oral Daily  . levofloxacin  500 mg Oral Q48H  . levothyroxine  25 mcg Oral QAC breakfast  . loratadine  10 mg Oral Daily  . metoprolol succinate  150 mg Oral Daily   Continuous Infusions:  Marzetta Board, MD, PhD [facility] Pager [phone]  If 7PM-7AM, please contact night-coverage [email or web address] Password [facility] 03/27/2016, 12:58 PM

## 2016-03-28 ENCOUNTER — Other Ambulatory Visit: Payer: Self-pay | Admitting: Licensed Clinical Social Worker

## 2016-03-28 ENCOUNTER — Encounter (HOSPITAL_COMMUNITY): Payer: Medicare Other

## 2016-03-28 ENCOUNTER — Other Ambulatory Visit (HOSPITAL_COMMUNITY): Payer: Medicare Other

## 2016-03-28 LAB — COMPREHENSIVE METABOLIC PANEL
ALBUMIN: 2.6 g/dL — AB (ref 3.5–5.0)
ALK PHOS: 78 U/L (ref 38–126)
ALT: 19 U/L (ref 14–54)
ANION GAP: 5 (ref 5–15)
AST: 15 U/L (ref 15–41)
BUN: 45 mg/dL — ABNORMAL HIGH (ref 6–20)
CHLORIDE: 101 mmol/L (ref 101–111)
CO2: 29 mmol/L (ref 22–32)
Calcium: 8.5 mg/dL — ABNORMAL LOW (ref 8.9–10.3)
Creatinine, Ser: 4.54 mg/dL — ABNORMAL HIGH (ref 0.44–1.00)
GFR calc non Af Amer: 10 mL/min — ABNORMAL LOW (ref >60)
GFR, EST AFRICAN AMERICAN: 11 mL/min — AB (ref >60)
GLUCOSE: 140 mg/dL — AB (ref 65–99)
POTASSIUM: 4 mmol/L (ref 3.5–5.1)
SODIUM: 135 mmol/L (ref 135–145)
Total Bilirubin: 0.6 mg/dL (ref 0.3–1.2)
Total Protein: 5.8 g/dL — ABNORMAL LOW (ref 6.5–8.1)

## 2016-03-28 LAB — GLUCOSE, CAPILLARY
GLUCOSE-CAPILLARY: 173 mg/dL — AB (ref 65–99)
Glucose-Capillary: 140 mg/dL — ABNORMAL HIGH (ref 65–99)
Glucose-Capillary: 191 mg/dL — ABNORMAL HIGH (ref 65–99)
Glucose-Capillary: 171 mg/dL — ABNORMAL HIGH (ref 65–99)

## 2016-03-28 LAB — CBC
HEMATOCRIT: 24.4 % — AB (ref 36.0–46.0)
HEMOGLOBIN: 7.8 g/dL — AB (ref 12.0–15.0)
MCH: 30.7 pg (ref 26.0–34.0)
MCHC: 32 g/dL (ref 30.0–36.0)
MCV: 96.1 fL (ref 78.0–100.0)
Platelets: 196 10*3/uL (ref 150–400)
RBC: 2.54 MIL/uL — AB (ref 3.87–5.11)
RDW: 15.3 % (ref 11.5–15.5)
WBC: 7.1 10*3/uL (ref 4.0–10.5)

## 2016-03-28 LAB — PROCALCITONIN: PROCALCITONIN: 5.96 ng/mL

## 2016-03-28 NOTE — Patient Outreach (Signed)
[facility]) Care Management  03/28/2016  Brooke Stafford 1955/12/27 [id number]   Assessment- CSW received call from [place]. Patient was admitted for shortness of breath.[facility] Liaison informed CSW that patient has not been able to find new housing. Patient reports that she still has mold in her home and her landlord has not fixed the problem. CSW has provided all housing resources but patient states that these have been unsuccessful in assisting her. CSW will mail out a list that includes-permanent subsidized housing, private landlords that accept Section 8, transitional housing, emergency shelters and other housing resources.  Plan-CSW will not re open case. All housing resources have been provided. CSW will update [facility] Liaison.   [name], BSW, MSW, [email or web address] Phone: [phone] Fax: [phone]

## 2016-03-28 NOTE — Progress Notes (Signed)
Admit: 03/25/2016 LOS: 2  58F with AoCKD4 likely from overdiuresis with progressive CKD, numerous comorbidities  Subjective:  Good UOP Off lasix SCr slightly worsened K and HCO3 WNL 2L [place] BP OK No new compalints, denies SOB, CP.     12/13 0701 - 12/14 0700 In: 720 [P.O.:720] Out: 1650 [Urine:1650]  Filed Weights   03/26/16 0100 03/26/16 0659 03/27/16 0636  Weight: 109.9 kg (242 lb 4.6 oz) 110 kg (242 lb 8.1 oz) 109.9 kg (242 lb 4.8 oz)    Scheduled Meds: . allopurinol  300 mg Oral QPM  . amLODipine  10 mg Oral Daily  . aspirin  81 mg Oral Daily  . atorvastatin  40 mg Oral q1800  . cloNIDine  0.2 mg Oral TID  . clopidogrel  75 mg Oral Daily  . enoxaparin (LOVENOX) injection  30 mg Subcutaneous Q24H  . ezetimibe  10 mg Oral Daily  . famotidine  10 mg Oral BID  . gabapentin  400 mg Oral QHS  . hydrOXYzine  25 mg Oral TID  . insulin aspart  0-5 Units Subcutaneous QHS  . insulin aspart  0-9 Units Subcutaneous TID WC  . insulin glargine  25 Units Subcutaneous QHS  . ipratropium-albuterol  3 mL Nebulization TID  . isosorbide mononitrate  120 mg Oral Daily  . levofloxacin  500 mg Oral Q48H  . levothyroxine  25 mcg Oral QAC breakfast  . loratadine  10 mg Oral Daily  . metoprolol succinate  150 mg Oral Daily   Continuous Infusions: PRN Meds:.acetaminophen **OR** acetaminophen, acetaminophen-codeine, albuterol, benzonatate, calcium carbonate (dosed in mg elemental calcium), camphor-menthol **AND** [DISCONTINUED] hydrOXYzine, docusate sodium, feeding supplement (NEPRO CARB STEADY), fluticasone, naphazoline-glycerin, ondansetron **OR** ondansetron (ZOFRAN) IV, senna-docusate, sorbitol, technetium TC 55M diethylenetriame-pentaacetic acid, traZODone, zolpidem  Current Labs: reviewed    Physical Exam:  Blood pressure (!) 148/71, pulse 62, temperature 98.5 F (36.9 C), temperature source Oral, resp. rate 20, height 5' 10.5" (1.791 m), weight 109.9 kg (242 lb 4.8 oz), last menstrual  period 10/13/2014, SpO2 95 %. GEN: NAD, sititng in chair in NAD ENT: NCAT EYES: EOMI CV: RRR, PULM: nl WOB.   ABD: s/nt/nd SKIN: no rashes/lesons VOZ:DGUYQ [name]  A 1. AoCKD4; BL SCr aroudn 3; [place] CKA 2. CAP on levoflox 3. Acute S/D CHF Exacerbation s/p diuretics 4. COPD not on home O2 5. ASCVD hx/o CAD/CABG 6. Obesity 7. DM2 8. HTN 9. Anemia; TSAT 20 and Ferritin 91 12/5; normocytic; likely CKD related and worsened with acute illness  P  Cont to hold diuretics  Daily weights, Daily Renal Panel, Strict I/Os, Avoid nephrotoxins (NSAIDs, judicious IV Contrast)    Pearson Grippe MD 03/28/2016, 9:56 AM   Recent Labs Lab 03/26/16 0145 03/27/16 0147 03/28/16 0222  NA 138 137 135  K 3.4* 4.6 4.0  CL 102 102 101  CO2 25 28 29   GLUCOSE 155* 186* 140*  BUN 33* 42* 45*  CREATININE 3.40* 4.37* 4.54*  CALCIUM 8.9 8.7* 8.5*    Recent Labs Lab 03/26/16 0145 03/27/16 0147 03/28/16 0222  WBC 13.4* 8.1 7.1  NEUTROABS 12.2*  --   --   HGB 9.5* 8.1* 7.8*  HCT 29.7* 24.9* 24.4*  MCV 95.8 95.8 96.1  PLT 231 205 196

## 2016-03-28 NOTE — Care Management Note (Signed)
Case Management Note  Patient Details  Name: NICOLLE HEWARD MRN: [id number] Date of Birth: 1956/01/11  Subjective/Objective:    CHF, hypoxia,  CAP, DM, CKD             Action/Plan: Discharge Planning: NCM spoke to pt and states she needs RW and neb machine for home. Contacted [facility] DME rep for equipment. Pt desats and may need oxygen for home.   Expected Discharge Date:  03/29/16               Expected Discharge Plan:  Home/Self Care  In-House Referral:  Clinical Social Work  Discharge planning Services  CM Consult  Post Acute Care Choice:  NA Choice offered to:  NA  DME Arranged:  [place] rolling, [facility] DME Agency:  [place]:  NA [place] Agency:  NA  Status of Service:  Completed, signed off  If discussed at [employer] of Stay Meetings, dates discussed:    Additional Comments:  Erenest Rasher, RN 03/28/2016, 12:38 PM

## 2016-03-28 NOTE — Progress Notes (Signed)
PROGRESS NOTE  Brooke Stafford GNF:[id number] DOB: 1955/04/16 DOA: 03/25/2016 PCP: Angelica Chessman, MD   LOS: 2 days   Brief Narrative: 60 year old female with history of coronary artery disease, COPD, stroke, diabetes mellitus, CKD stage IV presented with 1 month history of nonproductive cough and shortness of breath that has worsened significantly in the past 3-4 days. The patient has had numerous ED visits for similar symptoms. On 02/24/2016, the patient was given Tessalon Perles and azithromycin for bronchitis. The patient previous to the emergency department on 03/20/2016. The patient was given a dose of intravenous furosemide and discharged home with increased dose of Lasix--60 mg a.m., 40 mg p.m. unfortunately, the patient continues to complain of worsening shortness of breath with orthopnea type symptoms. She also complains of increasing lower extremity edema and increasing abdominal girth as well as orthopnea type symptoms. She endorses compliance with the furosemide, but states that she drinks "plenty of fluid". She has had subjective fevers and chills without any headache, chest pain, abdominal pain, dysuria, hematuria, diarrhea.  Upon presentation, the patient was noted to have WBC 15.8, but was afebrile and hemodynamically stable. She was also hypoxemic with oxygen saturation of 70% when EMS arrived, but was weaned to 4 L nasal cannula in the emergency department. The patient was given furosemide IV and levofloxacin.  Assessment & Plan: Principal Problem:   Acute respiratory failure with hypoxia (HCC) Active Problems:   Hypothyroidism   CKD (chronic kidney disease) stage 5, GFR less than 15 ml/min (HCC)   Essential hypertension   Chronic combined systolic and diastolic congestive heart failure (HCC)   Diabetes mellitus with circulatory complication, with long-term current use of insulin (HCC)   Anemia of chronic disease   CAP (community acquired pneumonia)   Acute on chronic  combined systolic and diastolic CHF (congestive heart failure) (HCC)   Chronic kidney disease (CKD), stage IV (severe) (HCC)   Acute respiratory failure with hypoxia - Secondary to CHF exacerbation - Presently stable on nasal cannula - Wean oxygen for saturation greater than 88%, attempt room air today   Acute on chronic systolic and diastolic CHF - 84/69/6295 echo EF 40-45%, grade 1 DD - On 12/12 she received furosemide 40 mg 2 as well as 60 mg x 1, with resultant Cr elevation  - Strict I's and O's, net negative 2.4 L - she states that she has gained 10 pounds over the past week, place patient on fluid restriction - review of records show dry weight ~ 235 - admission weight 242 - cannot use ACEi due to CKD and has "allergy" to hydralazine  Leukocytosis/RLL Opacity - Likely stress demargination - The patient is afebrile hemodynamically stable - with elevated procalcitonin--4.81, cannot r/o underlying CAP - plan levofloxacin x 5 days - viral respiratory panel - UA--no pyuria  CKD stage IV - The patient has had a progression of her CKD, her creatinine increased from baseline of 3.4 yesterday to 4.4 today after Lasix, and she hasn't lost weight and clinically looks fluid overloaded, I have consulted nephrology, appreciate input - However, in May 2017 baseline was 2.4-2.7  Elevated troponin/ CAD - secondary to demand ischemia from CHF decompensation, elevation not in a pattern consistent with ACS - no angina - continue ASA and plavix  Diabetes mellitus insulin dependent - continue reduced dose lantus inpatient - novolog ISS - 03/15/2016, A1c 5.9  Hypertension - Continue metoprolol succinate, Imdur, clonidine, amlodipine - anticipate improvement with diuresis - BP acceptable this morning   COPD -  continue bronchodilators - stable - 20 pk year hx tobacco  Hyperlipidemia - continue statin  Hypokalemia - replete - check mag  Elevated D-Dimer  - V/Q scan  negative - venous duplex legs negative for DVT   DVT prophylaxis: Lovenox Code Status: Full code Family Communication: no family bedside Disposition Plan: home when ready   Consultants:   Nephrology   Procedures:   None   Antimicrobials:  Levaquin 12/12 >>   Subjective: - feeling better but she is afraid to come off oxygen.   Objective: Vitals:   03/27/16 2054 03/27/16 2148 03/28/16 0514 03/28/16 0737  BP:  (!) 145/89 (!) 148/71   Pulse:  63 62   Resp:  18 20   Temp:  100.3 F (37.9 C) 98.5 F (36.9 C)   TempSrc:  Oral Oral   SpO2: 98% 100% 94% 95%  Weight:      Height:        Intake/Output Summary (Last 24 hours) at 03/28/16 1132 Last data filed at 03/28/16 1001  Gross per 24 hour  Intake              720 ml  Output             1500 ml  Net             -780 ml   Filed Weights   03/26/16 0100 03/26/16 0659 03/27/16 0636  Weight: 109.9 kg (242 lb 4.6 oz) 110 kg (242 lb 8.1 oz) 109.9 kg (242 lb 4.8 oz)    Examination: Constitutional: NAD Vitals:   03/27/16 2054 03/27/16 2148 03/28/16 0514 03/28/16 0737  BP:  (!) 145/89 (!) 148/71   Pulse:  63 62   Resp:  18 20   Temp:  100.3 F (37.9 C) 98.5 F (36.9 C)   TempSrc:  Oral Oral   SpO2: 98% 100% 94% 95%  Weight:      Height:       Eyes: PERRL, lids and conjunctivae normal ENMT: Mucous membranes are moist. No oropharyngeal exudates Respiratory: clear to auscultation bilaterally, no wheezing, no crackles. Normal respiratory effort. No accessory muscle use.  Cardiovascular: Regular rate and rhythm, no murmurs / rubs / gallops. Trace LE edema. 2+ pedal pulses.  Abdomen: no tenderness. Bowel sounds positive.  Neurologic: non focal    Data Reviewed: I have personally reviewed following labs and imaging studies  CBC:  Recent Labs Lab 03/25/16 2243 03/26/16 0145 03/27/16 0147 03/28/16 0222  WBC 15.8* 13.4* 8.1 7.1  NEUTROABS  --  12.2*  --   --   HGB 10.1* 9.5* 8.1* 7.8*  HCT 30.7* 29.7* 24.9*  24.4*  MCV 95.3 95.8 95.8 96.1  PLT 234 231 205 681   Basic Metabolic Panel:  Recent Labs Lab 03/25/16 2243 03/26/16 0145 03/27/16 0147 03/28/16 0222  NA 137 138 137 135  K 3.6 3.4* 4.6 4.0  CL 101 102 102 101  CO2 27 25 28 29   GLUCOSE 196* 155* 186* 140*  BUN 32* 33* 42* 45*  CREATININE 3.38* 3.40* 4.37* 4.54*  CALCIUM 8.9 8.9 8.7* 8.5*  MG  --   --  3.1*  --    GFR: Estimated Creatinine Clearance: 17.6 mL/min (by C-G formula based on SCr of 4.54 mg/dL (H)). Liver Function Tests:  Recent Labs Lab 03/28/16 0222  AST 15  ALT 19  ALKPHOS 78  BILITOT 0.6  PROT 5.8*  ALBUMIN 2.6*   No results for input(s): LIPASE, AMYLASE in the  last 168 hours. No results for input(s): AMMONIA in the last 168 hours. Coagulation Profile: No results for input(s): INR, PROTIME in the last 168 hours. Cardiac Enzymes:  Recent Labs Lab 03/26/16 0145 03/26/16 0806 03/26/16 1524 03/27/16 0847  TROPONINI 0.14* 0.47* 0.85* 0.70*   BNP (last 3 results) No results for input(s): PROBNP in the last 8760 hours. HbA1C: No results for input(s): HGBA1C in the last 72 hours. CBG:  Recent Labs Lab 03/27/16 1114 03/27/16 1637 03/27/16 2136 03/28/16 0641 03/28/16 1116  GLUCAP 196* 164* 256* 140* 191*   Lipid Profile: No results for input(s): CHOL, HDL, LDLCALC, TRIG, CHOLHDL, LDLDIRECT in the last 72 hours. Thyroid Function Tests: No results for input(s): TSH, T4TOTAL, FREET4, T3FREE, THYROIDAB in the last 72 hours. Anemia Panel: No results for input(s): VITAMINB12, FOLATE, FERRITIN, TIBC, IRON, RETICCTPCT in the last 72 hours. Urine analysis:    Component Value Date/Time   COLORURINE YELLOW 03/26/2016 [place] 03/26/2016 1232   LABSPEC 1.005 03/26/2016 1232   PHURINE 7.0 03/26/2016 1232   GLUCOSEU 150 (A) 03/26/2016 1232   HGBUR MODERATE (A) 03/26/2016 [place] 03/26/2016 1232   BILIRUBINUR neg 06/02/2013 1540   KETONESUR NEGATIVE 03/26/2016  1232   PROTEINUR >=300 (A) 03/26/2016 1232   UROBILINOGEN 0.2 12/14/2014 1700   NITRITE NEGATIVE 03/26/2016 1232   LEUKOCYTESUR TRACE (A) 03/26/2016 1232   Sepsis Labs: Invalid input(s): PROCALCITONIN, LACTICIDVEN  Recent Results (from the past 240 hour(s))  Culture, blood (routine x 2) Call MD if unable to obtain prior to antibiotics being given     Status: None (Preliminary result)   Collection Time: 03/26/16  2:14 AM  Result Value Ref Range Status   Specimen Description BLOOD LEFT HAND  Final   Special Requests IN PEDIATRIC BOTTLE 4CC  Final   Culture NO GROWTH 1 DAY  Final   Report Status PENDING  Incomplete  Culture, blood (routine x 2) Call MD if unable to obtain prior to antibiotics being given     Status: None (Preliminary result)   Collection Time: 03/26/16  2:26 AM  Result Value Ref Range Status   Specimen Description BLOOD RIGHT HAND  Final   Special Requests IN PEDIATRIC BOTTLE 4CC  Final   Culture NO GROWTH 1 DAY  Final   Report Status PENDING  Incomplete  Culture, sputum-assessment     Status: None   Collection Time: 03/26/16 12:32 PM  Result Value Ref Range Status   Specimen Description SPUTUM  Final   Special Requests Normal  Final   Sputum evaluation   Final    MICROSCOPIC FINDINGS SUGGEST THAT THIS SPECIMEN IS NOT REPRESENTATIVE OF LOWER RESPIRATORY SECRETIONS. PLEASE RECOLLECT. Results Called to: RN [facility]  [id number] MLM    Report Status 03/26/2016 FINAL  Final  Urine culture     Status: None   Collection Time: 03/26/16 12:32 PM  Result Value Ref Range Status   Specimen Description URINE, CATHETERIZED  Final   Special Requests NONE  Final   Culture NO GROWTH  Final   Report Status 03/27/2016 FINAL  Final  Respiratory Panel by PCR     Status: None   Collection Time: 03/26/16 10:40 PM  Result Value Ref Range Status   Adenovirus NOT DETECTED NOT DETECTED Final   Coronavirus 229E NOT DETECTED NOT DETECTED Final   Coronavirus HKU1 NOT DETECTED NOT  DETECTED Final   Coronavirus NL63 NOT DETECTED NOT DETECTED Final   Coronavirus OC43 NOT DETECTED  NOT DETECTED Final   Metapneumovirus NOT DETECTED NOT DETECTED Final   Rhinovirus / Enterovirus NOT DETECTED NOT DETECTED Final   Influenza A NOT DETECTED NOT DETECTED Final   Influenza B NOT DETECTED NOT DETECTED Final   Parainfluenza Virus 1 NOT DETECTED NOT DETECTED Final   Parainfluenza Virus 2 NOT DETECTED NOT DETECTED Final   Parainfluenza Virus 3 NOT DETECTED NOT DETECTED Final   Parainfluenza Virus 4 NOT DETECTED NOT DETECTED Final   Respiratory Syncytial Virus NOT DETECTED NOT DETECTED Final   Bordetella pertussis NOT DETECTED NOT DETECTED Final   Chlamydophila pneumoniae NOT DETECTED NOT DETECTED Final   Mycoplasma pneumoniae NOT DETECTED NOT DETECTED Final    Radiology Studies: Nm Pulmonary Perf And Vent  Result Date: 03/26/2016 CLINICAL DATA:  Elevated D-dimer, shortness of breath EXAM: NUCLEAR MEDICINE VENTILATION - PERFUSION LUNG SCAN TECHNIQUE: Ventilation images were obtained in multiple projections using inhaled aerosol Tc-60m DTPA. Perfusion images were obtained in multiple projections after intravenous injection of Tc-9m MAA. RADIOPHARMACEUTICALS:  30.5 mCi Technetium-49m DTPA aerosol inhalation and 3.9 mCi Technetium-70m MAA IV COMPARISON:  Radiograph 03/25/2016 FINDINGS: Ventilation: No focal ventilation defect. Perfusion: No wedge shaped peripheral perfusion defects to suggest acute pulmonary embolism. There is cardiomegaly IMPRESSION: Negative examination Electronically Signed   By: Donavan Foil M.D.   On: 03/26/2016 21:25   Dg Chest Port 1 View  Result Date: 03/27/2016 CLINICAL DATA:  Shortness of breath, dyspnea EXAM: PORTABLE CHEST 1 VIEW COMPARISON:  03/25/2016 FINDINGS: Cardiomegaly again noted. Status post CABG. Residual mild interstitial prominence bilaterally probable improving mild interstitial edema. Improvement in aeration right base. Persistent residual  atelectasis or infiltrate left base. IMPRESSION: Residual mild interstitial prominence bilaterally probable improving mild interstitial edema. Improvement in aeration right base. Persistent residual atelectasis or infiltrate left base. Electronically Signed   By: Lahoma Crocker M.D.   On: 03/27/2016 08:41   Scheduled Meds: . allopurinol  300 mg Oral QPM  . amLODipine  10 mg Oral Daily  . aspirin  81 mg Oral Daily  . atorvastatin  40 mg Oral q1800  . cloNIDine  0.2 mg Oral TID  . clopidogrel  75 mg Oral Daily  . enoxaparin (LOVENOX) injection  30 mg Subcutaneous Q24H  . ezetimibe  10 mg Oral Daily  . famotidine  10 mg Oral BID  . gabapentin  400 mg Oral QHS  . hydrOXYzine  25 mg Oral TID  . insulin aspart  0-5 Units Subcutaneous QHS  . insulin aspart  0-9 Units Subcutaneous TID WC  . insulin glargine  25 Units Subcutaneous QHS  . ipratropium-albuterol  3 mL Nebulization TID  . isosorbide mononitrate  120 mg Oral Daily  . levofloxacin  500 mg Oral Q48H  . levothyroxine  25 mcg Oral QAC breakfast  . loratadine  10 mg Oral Daily  . metoprolol succinate  150 mg Oral Daily   Continuous Infusions:  Marzetta Board, MD, PhD [facility] Pager [phone]  If 7PM-7AM, please contact night-coverage [email or web address] Password [email or web address] 03/28/2016, 11:32 AM

## 2016-03-28 NOTE — Consult Note (Signed)
   [facility] CM Inpatient Consult   03/28/2016  Brooke Stafford [birth year] [id number]   Brooke Stafford was recently active with [place] program. Please see chart review then notes for further patient outreach details. Spoke with Brooke Stafford who states she has followed up with all of the community resources and list of low income apartments that [place] provided with no avail. States she does have SCAT and denies having issues with obtaining and affording medications. She states "my apartment is killing me with mold". Discussed that she "may" move in with her sister in [place] for a short period of time as an option. States she is on the waiting list for Section 8. Asked her to please follow up with her Medicaid case worker as well to see if she can assist in any way since she has had multiple ED visits and several hospitalizations due to per patient's report is due to her housing situation.  Writer contacted [employer], Jerene Pitch, who reports she has given patient all the community resources she has to give. States there are very limited resources in [place]. Made her aware that Brooke Stafford has followed up with all of the resources and alternative housing options that were given and had no success.    Made Brooke Stafford aware of conversation hospital liaison had with [facility] LCSW. Discussed that writer will make referral to [facility] for follow up on her COPD, CHF. Brooke Stafford states she needs a walker and a nebulizer machine. Will make inpatient RNCM aware of all of this. Brooke Stafford agreeable to re-starting [facility] Care Management services. Active consent on file. Will refer to office for [facility] RNCM since resources and the like have already been provided by [facility] LCSW. Brooke Stafford agreeable to this plan and states she will follow up with Medicaid case worker and with her sister for assistance.    [name], MSN-Ed, RN,BSN [facility]  Liaison [phone]

## 2016-03-29 ENCOUNTER — Encounter: Payer: Medicare Other | Admitting: Vascular Surgery

## 2016-03-29 ENCOUNTER — Inpatient Hospital Stay (HOSPITAL_COMMUNITY): Payer: Medicare Other

## 2016-03-29 LAB — BASIC METABOLIC PANEL
Anion gap: 8 (ref 5–15)
BUN: 46 mg/dL — AB (ref 6–20)
CO2: 25 mmol/L (ref 22–32)
Calcium: 8.9 mg/dL (ref 8.9–10.3)
Chloride: 105 mmol/L (ref 101–111)
Creatinine, Ser: 4.53 mg/dL — ABNORMAL HIGH (ref 0.44–1.00)
GFR calc Af Amer: 11 mL/min — ABNORMAL LOW (ref >60)
GFR, EST NON AFRICAN AMERICAN: 10 mL/min — AB (ref >60)
Glucose, Bld: 154 mg/dL — ABNORMAL HIGH (ref 65–99)
POTASSIUM: 3.8 mmol/L (ref 3.5–5.1)
SODIUM: 138 mmol/L (ref 135–145)

## 2016-03-29 LAB — GLUCOSE, CAPILLARY
GLUCOSE-CAPILLARY: 135 mg/dL — AB (ref 65–99)
GLUCOSE-CAPILLARY: 175 mg/dL — AB (ref 65–99)
GLUCOSE-CAPILLARY: 172 mg/dL — AB (ref 65–99)
Glucose-Capillary: 191 mg/dL — ABNORMAL HIGH (ref 65–99)

## 2016-03-29 MED ORDER — TRAMADOL HCL 50 MG PO TABS
50.0000 mg | ORAL_TABLET | Freq: Four times a day (QID) | ORAL | Status: DC | PRN
  Administered 2016-03-29 – 2016-03-31 (×3): 50 mg via ORAL
  Filled 2016-03-29 (×3): qty 1

## 2016-03-29 MED ORDER — FUROSEMIDE 80 MG PO TABS
80.0000 mg | ORAL_TABLET | Freq: Once | ORAL | Status: AC
  Administered 2016-03-29: 80 mg via ORAL
  Filled 2016-03-29: qty 1

## 2016-03-29 NOTE — Progress Notes (Signed)
PROGRESS NOTE  Brooke Stafford XIP:[id number] DOB: 16-Nov-1955 DOA: 03/25/2016 PCP: Angelica Chessman, MD   LOS: 3 days   Brief Narrative: 60 year old female with history of coronary artery disease, COPD, stroke, diabetes mellitus, CKD stage IV presented with 1 month history of nonproductive cough and shortness of breath that has worsened significantly in the past 3-4 days. The patient has had numerous ED visits for similar symptoms. On 02/24/2016, the patient was given Tessalon Perles and azithromycin for bronchitis. The patient previous to the emergency department on 03/20/2016. The patient was given a dose of intravenous furosemide and discharged home with increased dose of Lasix--60 mg a.m., 40 mg p.m. unfortunately, the patient continues to complain of worsening shortness of breath with orthopnea type symptoms. She also complains of increasing lower extremity edema and increasing abdominal girth as well as orthopnea type symptoms. She endorses compliance with the furosemide, but states that she drinks "plenty of fluid". She has had subjective fevers and chills without any headache, chest pain, abdominal pain, dysuria, hematuria, diarrhea.  Upon presentation, the patient was noted to have WBC 15.8, but was afebrile and hemodynamically stable. She was also hypoxemic with oxygen saturation of 70% when EMS arrived, but was weaned to 4 L nasal cannula in the emergency department. The patient was given furosemide IV and levofloxacin.  Assessment & Plan: Principal Problem:   Acute respiratory failure with hypoxia (HCC) Active Problems:   Hypothyroidism   CKD (chronic kidney disease) stage 5, GFR less than 15 ml/min (HCC)   Essential hypertension   Chronic combined systolic and diastolic congestive heart failure (HCC)   Diabetes mellitus with circulatory complication, with long-term current use of insulin (HCC)   Anemia of chronic disease   CAP (community acquired pneumonia)   Acute on chronic  combined systolic and diastolic CHF (congestive heart failure) (HCC)   Chronic kidney disease (CKD), stage IV (severe) (HCC)   Acute respiratory failure with hypoxia - Secondary to CHF exacerbation - Presently stable on nasal cannula - Wean oxygen for saturation greater than 88%, attempt room air today but unable to do so. Repeat CXR with persistent mild fluid overload. Lasix 80 po x 1  Acute on chronic systolic and diastolic CHF - 67/34/1937 echo EF 40-45%, grade 1 DD - On 12/12 she received furosemide 40 mg 2 as well as 60 mg x 1, with resultant Cr elevation  - she states that she has gained 10 pounds over the past week, place patient on fluid restriction - review of records show dry weight ~ 235 - admission weight 242, 241 today  - cannot use ACEi due to CKD and has "allergy" to hydralazine  Leukocytosis/RLL Opacity - Likely stress demargination - The patient is afebrile hemodynamically stable - with elevated procalcitonin--4.81, cannot r/o underlying CAP - plan levofloxacin x 5 days - viral respiratory panel - UA--no pyuria  CKD stage IV - The patient has had a progression of her CKD, her creatinine increased from baseline of 3.4 yesterday to 4.4 today after Lasix, and she hasn't lost weight and clinically looks fluid overloaded, I have consulted nephrology, appreciate input - However, in May 2017 baseline was 2.4-2.7 - Lasix 80 po x 1 today   Elevated troponin/ CAD - secondary to demand ischemia from CHF decompensation, elevation not in a pattern consistent with ACS - no angina - continue ASA and plavix  Diabetes mellitus insulin dependent - continue reduced dose lantus inpatient - novolog ISS - 03/15/2016, A1c 5.9  Hypertension - Continue metoprolol  succinate, Imdur, clonidine, amlodipine - anticipate improvement with diuresis - BP acceptable this morning, continue current regimen   COPD - continue bronchodilators - stable - 20 pk year hx tobacco - may  need oxygen at home   Hyperlipidemia - continue statin  Hypokalemia - replete - check mag  Elevated D-Dimer  - V/Q scan negative - venous duplex legs negative for DVT   DVT prophylaxis: Lovenox Code Status: Full code Family Communication: no family bedside Disposition Plan: home when ready   Consultants:   Nephrology   Procedures:   None   Antimicrobials:  Levaquin 12/12 >>   Subjective: - coughing more today, still dyspneic  Objective: Vitals:   03/29/16 1157 03/29/16 1159 03/29/16 1200 03/29/16 1210  BP:      Pulse:    71  Resp:      Temp:      TempSrc:      SpO2: (!) 86% 90% 92% 90%  Weight:      Height:        Intake/Output Summary (Last 24 hours) at 03/29/16 1242 Last data filed at 03/29/16 0800  Gross per 24 hour  Intake              720 ml  Output             1750 ml  Net            -1030 ml   Filed Weights   03/26/16 0659 03/27/16 0636 03/29/16 0611  Weight: 110 kg (242 lb 8.1 oz) 109.9 kg (242 lb 4.8 oz) 109.5 kg (241 lb 8 oz)    Examination: Constitutional: NAD Vitals:   03/29/16 1157 03/29/16 1159 03/29/16 1200 03/29/16 1210  BP:      Pulse:    71  Resp:      Temp:      TempSrc:      SpO2: (!) 86% 90% 92% 90%  Weight:      Height:       Eyes: PERRL, lids and conjunctivae normal ENMT: Mucous membranes are moist. No oropharyngeal exudates Respiratory: clear to auscultation bilaterally, no wheezing, no crackles. Normal respiratory effort. No accessory muscle use.  Cardiovascular: Regular rate and rhythm, no murmurs / rubs / gallops. Trace LE edema. 2+ pedal pulses.  Abdomen: no tenderness. Bowel sounds positive.  Neurologic: non focal    Data Reviewed: I have personally reviewed following labs and imaging studies  CBC:  Recent Labs Lab 03/25/16 2243 03/26/16 0145 03/27/16 0147 03/28/16 0222  WBC 15.8* 13.4* 8.1 7.1  NEUTROABS  --  12.2*  --   --   HGB 10.1* 9.5* 8.1* 7.8*  HCT 30.7* 29.7* 24.9* 24.4*  MCV 95.3  95.8 95.8 96.1  PLT 234 231 205 419   Basic Metabolic Panel:  Recent Labs Lab 03/25/16 2243 03/26/16 0145 03/27/16 0147 03/28/16 0222 03/29/16 0934  NA 137 138 137 135 138  K 3.6 3.4* 4.6 4.0 3.8  CL 101 102 102 101 105  CO2 27 25 28 29 25   GLUCOSE 196* 155* 186* 140* 154*  BUN 32* 33* 42* 45* 46*  CREATININE 3.38* 3.40* 4.37* 4.54* 4.53*  CALCIUM 8.9 8.9 8.7* 8.5* 8.9  MG  --   --  3.1*  --   --    GFR: Estimated Creatinine Clearance: 17.6 mL/min (by C-G formula based on SCr of 4.53 mg/dL (H)). Liver Function Tests:  Recent Labs Lab 03/28/16 0222  AST 15  ALT 19  ALKPHOS  78  BILITOT 0.6  PROT 5.8*  ALBUMIN 2.6*   No results for input(s): LIPASE, AMYLASE in the last 168 hours. No results for input(s): AMMONIA in the last 168 hours. Coagulation Profile: No results for input(s): INR, PROTIME in the last 168 hours. Cardiac Enzymes:  Recent Labs Lab 03/26/16 0145 03/26/16 0806 03/26/16 1524 03/27/16 0847  TROPONINI 0.14* 0.47* 0.85* 0.70*   BNP (last 3 results) No results for input(s): PROBNP in the last 8760 hours. HbA1C: No results for input(s): HGBA1C in the last 72 hours. CBG:  Recent Labs Lab 03/28/16 1116 03/28/16 1635 03/28/16 2124 03/29/16 0612 03/29/16 1109  GLUCAP 191* 173* 171* 135* 175*   Lipid Profile: No results for input(s): CHOL, HDL, LDLCALC, TRIG, CHOLHDL, LDLDIRECT in the last 72 hours. Thyroid Function Tests: No results for input(s): TSH, T4TOTAL, FREET4, T3FREE, THYROIDAB in the last 72 hours. Anemia Panel: No results for input(s): VITAMINB12, FOLATE, FERRITIN, TIBC, IRON, RETICCTPCT in the last 72 hours. Urine analysis:    Component Value Date/Time   COLORURINE YELLOW 03/26/2016 [place] 03/26/2016 1232   LABSPEC 1.005 03/26/2016 1232   PHURINE 7.0 03/26/2016 1232   GLUCOSEU 150 (A) 03/26/2016 1232   HGBUR MODERATE (A) 03/26/2016 [place] 03/26/2016 1232   BILIRUBINUR neg 06/02/2013  1540   KETONESUR NEGATIVE 03/26/2016 1232   PROTEINUR >=300 (A) 03/26/2016 1232   UROBILINOGEN 0.2 12/14/2014 1700   NITRITE NEGATIVE 03/26/2016 1232   LEUKOCYTESUR TRACE (A) 03/26/2016 1232   Sepsis Labs: Invalid input(s): PROCALCITONIN, LACTICIDVEN  Recent Results (from the past 240 hour(s))  Culture, blood (routine x 2) Call MD if unable to obtain prior to antibiotics being given     Status: None (Preliminary result)   Collection Time: 03/26/16  2:14 AM  Result Value Ref Range Status   Specimen Description BLOOD LEFT HAND  Final   Special Requests IN PEDIATRIC BOTTLE 4CC  Final   Culture NO GROWTH 2 DAYS  Final   Report Status PENDING  Incomplete  Culture, blood (routine x 2) Call MD if unable to obtain prior to antibiotics being given     Status: None (Preliminary result)   Collection Time: 03/26/16  2:26 AM  Result Value Ref Range Status   Specimen Description BLOOD RIGHT HAND  Final   Special Requests IN PEDIATRIC BOTTLE 4CC  Final   Culture NO GROWTH 2 DAYS  Final   Report Status PENDING  Incomplete  Culture, sputum-assessment     Status: None   Collection Time: 03/26/16 12:32 PM  Result Value Ref Range Status   Specimen Description SPUTUM  Final   Special Requests Normal  Final   Sputum evaluation   Final    MICROSCOPIC FINDINGS SUGGEST THAT THIS SPECIMEN IS NOT REPRESENTATIVE OF LOWER RESPIRATORY SECRETIONS. PLEASE RECOLLECT. Results Called to: RN [facility]  [id number] MLM    Report Status 03/26/2016 FINAL  Final  Urine culture     Status: None   Collection Time: 03/26/16 12:32 PM  Result Value Ref Range Status   Specimen Description URINE, CATHETERIZED  Final   Special Requests NONE  Final   Culture NO GROWTH  Final   Report Status 03/27/2016 FINAL  Final  Respiratory Panel by PCR     Status: None   Collection Time: 03/26/16 10:40 PM  Result Value Ref Range Status   Adenovirus NOT DETECTED NOT DETECTED Final   Coronavirus 229E NOT DETECTED NOT DETECTED  Final   Coronavirus HKU1  NOT DETECTED NOT DETECTED Final   Coronavirus NL63 NOT DETECTED NOT DETECTED Final   Coronavirus OC43 NOT DETECTED NOT DETECTED Final   Metapneumovirus NOT DETECTED NOT DETECTED Final   Rhinovirus / Enterovirus NOT DETECTED NOT DETECTED Final   Influenza A NOT DETECTED NOT DETECTED Final   Influenza B NOT DETECTED NOT DETECTED Final   Parainfluenza Virus 1 NOT DETECTED NOT DETECTED Final   Parainfluenza Virus 2 NOT DETECTED NOT DETECTED Final   Parainfluenza Virus 3 NOT DETECTED NOT DETECTED Final   Parainfluenza Virus 4 NOT DETECTED NOT DETECTED Final   Respiratory Syncytial Virus NOT DETECTED NOT DETECTED Final   Bordetella pertussis NOT DETECTED NOT DETECTED Final   Chlamydophila pneumoniae NOT DETECTED NOT DETECTED Final   Mycoplasma pneumoniae NOT DETECTED NOT DETECTED Final    Radiology Studies: Dg Chest Port 1 View  Result Date: 03/29/2016 CLINICAL DATA:  Shortness of Breath EXAM: PORTABLE CHEST 1 VIEW COMPARISON:  03/27/2016 FINDINGS: Prior CABG. Mild cardiomegaly and vascular congestion. Interstitial prominence within the lungs could reflect mild interstitial edema, similar to prior study. Lingular subsegmental atelectasis or scarring. IMPRESSION: Suspect mild interstitial edema, similar to prior study. Lingular atelectasis or scarring. Electronically Signed   By: Rolm Baptise M.D.   On: 03/29/2016 12:37   Scheduled Meds: . allopurinol  300 mg Oral QPM  . amLODipine  10 mg Oral Daily  . aspirin  81 mg Oral Daily  . atorvastatin  40 mg Oral q1800  . cloNIDine  0.2 mg Oral TID  . clopidogrel  75 mg Oral Daily  . enoxaparin (LOVENOX) injection  30 mg Subcutaneous Q24H  . ezetimibe  10 mg Oral Daily  . famotidine  10 mg Oral BID  . gabapentin  400 mg Oral QHS  . hydrOXYzine  25 mg Oral TID  . insulin aspart  0-5 Units Subcutaneous QHS  . insulin aspart  0-9 Units Subcutaneous TID WC  . insulin glargine  25 Units Subcutaneous QHS  . isosorbide  mononitrate  120 mg Oral Daily  . levofloxacin  500 mg Oral Q48H  . levothyroxine  25 mcg Oral QAC breakfast  . loratadine  10 mg Oral Daily  . metoprolol succinate  150 mg Oral Daily   Continuous Infusions:  Marzetta Board, MD, PhD [facility] Pager [phone]  If 7PM-7AM, please contact night-coverage [email or web address] Password [email or web address] 03/29/2016, 12:42 PM

## 2016-03-29 NOTE — Progress Notes (Signed)
SATURATION QUALIFICATIONS: (This note is used to comply with regulatory documentation for home oxygen)  Patient Saturations on Room Air at Rest = 87%  Patient Saturations on 1L at rest = 91%  Patient Saturations on 4 Liters of oxygen while Ambulating = 93%  Please briefly explain why patient needs home oxygen: Pt desaturating on RA at rest and required 3L to maintain 88% and 4L to maintain 90% Elwyn Reach, [place]

## 2016-03-29 NOTE — Progress Notes (Signed)
Admit: 03/25/2016 LOS: 3  23F with AoCKD4 likely from overdiuresis with progressive CKD, numerous comorbidities  Subjective:  Stable GFR in past 24h; K BUN and HCo3 stable Good UOP Off lasix Working with PT  Brooke 0701 - 12/15 0700 In: 720 [P.O.:720] Out: 2300 [Urine:2300]  Filed Weights   03/26/16 0659 03/27/16 0636 03/29/16 0611  Weight: 110 kg (242 lb 8.1 oz) 109.9 kg (242 lb 4.8 oz) 109.5 kg (241 lb 8 oz)    Scheduled Meds: . allopurinol  300 mg Oral QPM  . amLODipine  10 mg Oral Daily  . aspirin  81 mg Oral Daily  . atorvastatin  40 mg Oral q1800  . cloNIDine  0.2 mg Oral TID  . clopidogrel  75 mg Oral Daily  . enoxaparin (LOVENOX) injection  30 mg Subcutaneous Q24H  . ezetimibe  10 mg Oral Daily  . famotidine  10 mg Oral BID  . gabapentin  400 mg Oral QHS  . hydrOXYzine  25 mg Oral TID  . insulin aspart  0-5 Units Subcutaneous QHS  . insulin aspart  0-9 Units Subcutaneous TID WC  . insulin glargine  25 Units Subcutaneous QHS  . isosorbide mononitrate  120 mg Oral Daily  . levofloxacin  500 mg Oral Q48H  . levothyroxine  25 mcg Oral QAC breakfast  . loratadine  10 mg Oral Daily  . metoprolol succinate  150 mg Oral Daily   Continuous Infusions: PRN Meds:.acetaminophen **OR** acetaminophen, acetaminophen-codeine, albuterol, benzonatate, calcium carbonate (dosed in mg elemental calcium), camphor-menthol **AND** [DISCONTINUED] hydrOXYzine, docusate sodium, feeding supplement (NEPRO CARB STEADY), fluticasone, naphazoline-glycerin, ondansetron **OR** ondansetron (ZOFRAN) IV, senna-docusate, sorbitol, technetium TC 28M diethylenetriame-pentaacetic acid, traZODone, zolpidem  Current Labs: reviewed    Physical Exam:  Blood pressure (!) 162/63, pulse 71, temperature 97.7 F (36.5 C), temperature source Oral, resp. rate 18, height 5' 10.5" (1.791 m), weight 109.5 kg (241 lb 8 oz), last menstrual period 10/13/2014, SpO2 100 %. GEN: NAD, sititng in chair in NAD ENT:  NCAT EYES: EOMI CV: RRR, PULM: nl WOB.   ABD: s/nt/nd SKIN: no rashes/lesons SJG:GEZMO [name]  A 1. AoCKD4; BL SCr aroudn 3; [place] CKA 2. CAP on levoflox 3. Acute S/D CHF Exacerbation s/p diuretics 4. COPD not on home O2 5. ASCVD hx/o CAD/CABG 6. Obesity 7. DM2 8. HTN 9. Anemia; TSAT 20 and Ferritin 91 12/5; normocytic; likely CKD related and worsened with acute illness  P  Cont to hold diuretics; would like to see downtrendign SCr prior to discharge and will need to resume lower dose diuretics at that time  Daily weights, Daily Renal Panel, Strict I/Os, Avoid nephrotoxins (NSAIDs, judicious IV Contrast)    Pearson Grippe MD 03/29/2016, 11:50 AM   Recent Labs Lab 03/27/16 0147 Brooke/17 0222 03/29/16 0934  NA 137 135 138  K 4.6 4.0 3.8  CL 102 101 105  CO2 28 29 25   GLUCOSE 186* 140* 154*  BUN 42* 45* 46*  CREATININE 4.37* 4.54* 4.53*  CALCIUM 8.7* 8.5* 8.9    Recent Labs Lab 03/26/16 0145 03/27/16 0147 Brooke/17 0222  WBC 13.4* 8.1 7.1  NEUTROABS 12.2*  --   --   HGB 9.5* 8.1* 7.8*  HCT 29.7* 24.9* 24.4*  MCV 95.8 95.8 96.1  PLT 231 205 196

## 2016-03-29 NOTE — Patient Outreach (Signed)
[facility]) Care Management  03/29/2016  Brooke Stafford 1955/10/12 [id number]   Request received from [name], LCSW to mail patient housing resources.   Jacqulynn Cadet  [facility] Care Management Assistant

## 2016-03-29 NOTE — Evaluation (Signed)
Physical Therapy Evaluation Patient Details Name: Brooke Stafford MRN: [id number] DOB: Nov 14, 1955 Today's Date: 03/29/2016   History of Present Illness  60 yo admitted with hypoxia and CHF exacerbation. PMHx: CAD, CHF, COPD, CKD, CABG  Clinical Impression  Patient has lower extremity weakness, cardiopulmonary limitations, decreased activity tolerance, and decreased mobility and would benefit from physical therapy to address these deficits and maximize function upon return home. Patient was on 0.5 L O2 upon arrival, but with activity patient had decreased O2 saturation and required an increase in O2 level (see below). Discussed with RN prior to session. Patient tolerated ambulation well, and would benefit from progressed mobility while in the hospital. Patient educated on discharge plan and agrees. Will continue to follow.   Resting O2sat 1 L O2: 91% Resting O2sat room air: 87% Ambulation O2sat 4 L O2: 93%  HR during session: 71-77    Follow Up Recommendations Home health PT;Supervision for mobility/OOB    Equipment Recommendations  None recommended by PT    Recommendations for Other Services       Precautions / Restrictions Precautions Precautions: Fall Precaution Comments: watch sats      Mobility  Bed Mobility               General bed mobility comments: patient sitting EOB upon arrival.   Transfers Overall transfer level: Needs assistance Equipment used: Rolling walker (2 wheeled) Transfers: Sit to/from Stand Sit to Stand: Min guard         General transfer comment: patient required verbal cuing for hand placement, and steadying upon standing. sit to stand x2.   Ambulation/Gait Ambulation/Gait assistance: Min guard Ambulation Distance (Feet): 180 Feet Assistive device: Rolling walker (2 wheeled) Gait Pattern/deviations: Step-through pattern;Decreased stride length   Gait velocity interpretation: Below normal speed for age/gender General Gait Details:  cues for posture and looking up as well as decreased weight bearing on bil UE  Stairs            Wheelchair Mobility    Modified Rankin (Stroke Patients Only)       Balance Overall balance assessment: Needs assistance Sitting-balance support: Feet supported;Bilateral upper extremity supported Sitting balance-Leahy Scale: Fair   Postural control: Posterior lean Standing balance support: Bilateral upper extremity supported;During functional activity Standing balance-Leahy Scale: Fair Standing balance comment: patient relied on UE support of RW when standing.                              Pertinent Vitals/Pain Pain Assessment: No/denies pain    Home Living Family/patient expects to be discharged to:: Private residence Living Arrangements: Non-relatives/Friends (roomate) Available Help at Discharge: Friend(s);Available PRN/intermittently Type of Home: House Home Access: Stairs to enter Entrance Stairs-Rails: Psychiatric nurse of Steps: 4 Home Layout: One level Home Equipment: Cane - single point Additional Comments: pt plans to move to sister's house with 6 steps    Prior Function Level of Independence: Independent with assistive device(s)         Comments: was using cane PTA     Hand Dominance        Extremity/Trunk Assessment   Upper Extremity Assessment Upper Extremity Assessment: Overall WFL for tasks assessed    Lower Extremity Assessment Lower Extremity Assessment: Generalized weakness       Communication   Communication: No difficulties  Cognition Arousal/Alertness: Awake/alert Behavior During Therapy: WFL for tasks assessed/performed Overall Cognitive Status: Within Functional Limits for tasks assessed  General Comments      Exercises     Assessment/Plan    PT Assessment Patient needs continued PT services  PT Problem List Decreased strength;Decreased activity  tolerance;Decreased balance;Decreased mobility;Decreased coordination;Decreased knowledge of use of DME;Cardiopulmonary status limiting activity          PT Treatment Interventions DME instruction;Gait training;Functional mobility training;Stair training;Therapeutic activities;Therapeutic exercise;Balance training;Patient/family education    PT Goals (Current goals can be found in the Care Plan section)  Acute Rehab PT Goals Patient Stated Goal: take care of myself PT Goal Formulation: With patient Time For Goal Achievement: 04/12/16 Potential to Achieve Goals: Good    Frequency Min 3X/week   Barriers to discharge        Co-evaluation               End of Session Equipment Utilized During Treatment: Gait belt;Oxygen Activity Tolerance: Patient tolerated treatment well;Patient limited by fatigue Patient left: in chair;with call bell/phone within reach Nurse Communication: Mobility status         Time: 2518-9842 PT Time Calculation (min) (ACUTE ONLY): 30 min   Charges:   PT Evaluation $PT Eval Moderate Complexity: 1 Procedure PT Treatments $Gait Training: 8-22 mins   PT G Codes:        Denys Salinger April 04, 2016, 12:51 PM Livian Vanderbeck SPT [phone]
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
## 2016-03-29 NOTE — Care Management (Signed)
Pt is on RA.  CM will continue to assess for discharge needs

## 2016-03-29 NOTE — Care Management Note (Addendum)
Case Management Note Original Note Initated by Jonnie Finner 03/28/16  Patient Details  Name: Brooke Stafford MRN: [id number] Date of Birth: June 11, 1955  Subjective/Objective:    CHF, hypoxia,  CAP, DM, CKD             Action/Plan: Discharge Planning: NCM spoke to pt and states she needs RW and neb machine for home. Contacted [facility] DME rep for equipment. Pt desats and may need oxygen for home.   Expected Discharge Date:  03/29/16               Expected Discharge Plan:  Home/Self Care  In-House Referral:  Clinical Social Work  Discharge planning Services  CM Consult  Post Acute Care Choice:  NA Choice offered to:  NA  DME Arranged:  Walker rolling, Nebulizer machine, Oxygen DME Agency:  [place]:  NA [place] Agency:  NA  Status of Service:  Completed, signed off  If discussed at [place] of Stay Meetings, dates discussed:    Additional Comments: 03/29/2016 CM contacted by bedside nurse and informed that pt will need home oxygen.  Ambulation note documented by PT.  Pt offered choice - chose [facility] - agency contacted and referral accepted and informed that pt will discharge during weekend.    CM requested oxygen DME order directly from bedside nurse and via physician sticky note Maryclare Labrador, RN 03/29/2016, 1:51 PM

## 2016-03-30 LAB — GLUCOSE, CAPILLARY
Glucose-Capillary: 124 mg/dL — ABNORMAL HIGH (ref 65–99)
Glucose-Capillary: 175 mg/dL — ABNORMAL HIGH (ref 65–99)
Glucose-Capillary: 131 mg/dL — ABNORMAL HIGH (ref 65–99)
Glucose-Capillary: 179 mg/dL — ABNORMAL HIGH (ref 65–99)

## 2016-03-30 LAB — BASIC METABOLIC PANEL
Anion gap: 9 (ref 5–15)
BUN: 47 mg/dL — ABNORMAL HIGH (ref 6–20)
CALCIUM: 9 mg/dL (ref 8.9–10.3)
CO2: 24 mmol/L (ref 22–32)
CREATININE: 4.33 mg/dL — AB (ref 0.44–1.00)
Chloride: 103 mmol/L (ref 101–111)
GFR calc Af Amer: 12 mL/min — ABNORMAL LOW (ref >60)
GFR calc non Af Amer: 10 mL/min — ABNORMAL LOW (ref >60)
GLUCOSE: 132 mg/dL — AB (ref 65–99)
Potassium: 4.1 mmol/L (ref 3.5–5.1)
Sodium: 136 mmol/L (ref 135–145)

## 2016-03-30 LAB — PROCALCITONIN: Procalcitonin: 2.05 ng/mL

## 2016-03-30 MED ORDER — FUROSEMIDE 80 MG PO TABS
80.0000 mg | ORAL_TABLET | Freq: Every day | ORAL | Status: DC
  Administered 2016-03-30 – 2016-03-31 (×2): 80 mg via ORAL
  Filled 2016-03-30 (×2): qty 1

## 2016-03-30 NOTE — Progress Notes (Signed)
PROGRESS NOTE  Brooke Stafford WCH:[id number] DOB: [birth year] DOA: 03/25/2016 PCP: Angelica Chessman, MD   LOS: 4 days   Brief Narrative: 60 year old female with history of coronary artery disease, COPD, stroke, diabetes mellitus, CKD stage IV presented with 1 month history of nonproductive cough and shortness of breath that has worsened significantly in the past 3-4 days. The patient has had numerous ED visits for similar symptoms. On 02/24/2016, the patient was given Tessalon Perles and azithromycin for bronchitis. The patient previous to the emergency department on 03/20/2016. The patient was given a dose of intravenous furosemide and discharged home with increased dose of Lasix--60 mg a.m., 40 mg p.m. unfortunately, the patient continues to complain of worsening shortness of breath with orthopnea type symptoms. She also complains of increasing lower extremity edema and increasing abdominal girth as well as orthopnea type symptoms. She endorses compliance with the furosemide, but states that she drinks "plenty of fluid". She has had subjective fevers and chills without any headache, chest pain, abdominal pain, dysuria, hematuria, diarrhea.  Upon presentation, the patient was noted to have WBC 15.8, but was afebrile and hemodynamically stable. She was also hypoxemic with oxygen saturation of 70% when EMS arrived, but was weaned to 4 L nasal cannula in the emergency department. The patient was given furosemide IV and levofloxacin.  Assessment & Plan: Principal Problem:   Acute respiratory failure with hypoxia (HCC) Active Problems:   Hypothyroidism   CKD (chronic kidney disease) stage 5, GFR less than 15 ml/min (HCC)   Essential hypertension   Chronic combined systolic and diastolic congestive heart failure (HCC)   Diabetes mellitus with circulatory complication, with long-term current use of insulin (HCC)   Anemia of chronic disease   CAP (community acquired pneumonia)   Acute on chronic  combined systolic and diastolic CHF (congestive heart failure) (HCC)   Chronic kidney disease (CKD), stage IV (severe) (HCC)   Acute respiratory failure with hypoxia - Secondary to CHF exacerbation vs  CAP - Improving, continue diuresis with Lasix   Acute on chronic systolic and diastolic CHF - 35/36/1443 echo EF 40-45%, grade 1 DD - On 12/12 she received furosemide 40 mg 2 as well as 60 mg x 1, with resultant Cr elevation  - she states that she has gained 10 pounds over the past week, place patient on fluid restriction - review of records show dry weight ~ 235 - admission weight 242, 241 >> 241 today  - cannot use ACEi due to CKD and has "allergy" to hydralazine  Leukocytosis/RLL Opacity - Likely stress demargination - The patient is afebrile hemodynamically stable - with elevated procalcitonin--4.81, cannot r/o underlying CAP - plan levofloxacin x 5 days - viral respiratory panel - UA--no pyuria  CKD stage IV - The patient has had a progression of her CKD, her creatinine increased from baseline of 3.4 yesterday to 4.4 today after Lasix, and she hasn't lost weight and clinically looks fluid overloaded, I have consulted nephrology, appreciate input - However, in May 2017 baseline was 2.4-2.7 - Lasix 80 po x 1 today   Elevated troponin/ CAD - secondary to demand ischemia from CHF decompensation, elevation not in a pattern consistent with ACS - no angina - continue ASA and plavix  Diabetes mellitus insulin dependent - continue reduced dose lantus inpatient - novolog ISS - 03/15/2016, A1c 5.9  Hypertension - Continue metoprolol succinate, Imdur, clonidine, amlodipine - anticipate improvement with diuresis - BP acceptable this morning, continue current regimen   COPD - continue bronchodilators -  stable - 20 pk year hx tobacco - may need oxygen at home, to be determined on the day of discharge  Hyperlipidemia - continue statin  Hypokalemia - replete - check  mag  Elevated D-Dimer  - V/Q scan negative - venous duplex legs negative for DVT  Deconditioning  - Patient very concerned today about her ability to function at home, we'll ask physical therapy to reevaluate   DVT prophylaxis: Lovenox Code Status: Full code Family Communication: no family bedside Disposition Plan: home when ready   Consultants:   Nephrology   Procedures:   None   Antimicrobials:  Levaquin 12/12 >>   Subjective: - coughing improving, still dyspneic  Objective: Vitals:   03/29/16 2123 03/29/16 2305 03/30/16 0553 03/30/16 0559  BP: (!) 167/75 (!) 166/66 (!) 188/87 (!) 168/92  Pulse: 68 81 78   Resp: 18 20 18    Temp: 98.9 F (37.2 C)  97.9 F (36.6 C)   TempSrc: Oral  Oral   SpO2: 100% 99% 97% 98%  Weight:   109.4 kg (241 lb 1.6 oz)   Height:        Intake/Output Summary (Last 24 hours) at 03/30/16 1332 Last data filed at 03/30/16 0824  Gross per 24 hour  Intake                0 ml  Output             1675 ml  Net            -1675 ml   Filed Weights   03/27/16 0636 03/29/16 0611 03/30/16 0553  Weight: 109.9 kg (242 lb 4.8 oz) 109.5 kg (241 lb 8 oz) 109.4 kg (241 lb 1.6 oz)    Examination: Constitutional: NAD Vitals:   03/29/16 2123 03/29/16 2305 03/30/16 0553 03/30/16 0559  BP: (!) 167/75 (!) 166/66 (!) 188/87 (!) 168/92  Pulse: 68 81 78   Resp: 18 20 18    Temp: 98.9 F (37.2 C)  97.9 F (36.6 C)   TempSrc: Oral  Oral   SpO2: 100% 99% 97% 98%  Weight:   109.4 kg (241 lb 1.6 oz)   Height:       Eyes: PERRL, lids and conjunctivae normal ENMT: Mucous membranes are moist. No oropharyngeal exudates Respiratory: clear to auscultation bilaterally, no wheezing, no crackles. Normal respiratory effort. No accessory muscle use.  Cardiovascular: Regular rate and rhythm, no murmurs / rubs / gallops. Trace LE edema. 2+ pedal pulses.  Abdomen: no tenderness. Bowel sounds positive.  Neurologic: non focal    Data Reviewed: I have  personally reviewed following labs and imaging studies  CBC:  Recent Labs Lab 03/25/16 2243 03/26/16 0145 03/27/16 0147 03/28/16 0222  WBC 15.8* 13.4* 8.1 7.1  NEUTROABS  --  12.2*  --   --   HGB 10.1* 9.5* 8.1* 7.8*  HCT 30.7* 29.7* 24.9* 24.4*  MCV 95.3 95.8 95.8 96.1  PLT 234 231 205 939   Basic Metabolic Panel:  Recent Labs Lab 03/26/16 0145 03/27/16 0147 03/28/16 0222 03/29/16 0934 03/30/16 0228  NA 138 137 135 138 136  K 3.4* 4.6 4.0 3.8 4.1  CL 102 102 101 105 103  CO2 25 28 29 25 24   GLUCOSE 155* 186* 140* 154* 132*  BUN 33* 42* 45* 46* 47*  CREATININE 3.40* 4.37* 4.54* 4.53* 4.33*  CALCIUM 8.9 8.7* 8.5* 8.9 9.0  MG  --  3.1*  --   --   --  GFR: Estimated Creatinine Clearance: 18.4 mL/min (by C-G formula based on SCr of 4.33 mg/dL (H)). Liver Function Tests:  Recent Labs Lab 03/28/16 0222  AST 15  ALT 19  ALKPHOS 78  BILITOT 0.6  PROT 5.8*  ALBUMIN 2.6*   No results for input(s): LIPASE, AMYLASE in the last 168 hours. No results for input(s): AMMONIA in the last 168 hours. Coagulation Profile: No results for input(s): INR, PROTIME in the last 168 hours. Cardiac Enzymes:  Recent Labs Lab 03/26/16 0145 03/26/16 0806 03/26/16 1524 03/27/16 0847  TROPONINI 0.14* 0.47* 0.85* 0.70*   BNP (last 3 results) No results for input(s): PROBNP in the last 8760 hours. HbA1C: No results for input(s): HGBA1C in the last 72 hours. CBG:  Recent Labs Lab 03/29/16 1109 03/29/16 1621 03/29/16 2111 03/30/16 0639 03/30/16 1103  GLUCAP 175* 191* 172* 124* 175*   Lipid Profile: No results for input(s): CHOL, HDL, LDLCALC, TRIG, CHOLHDL, LDLDIRECT in the last 72 hours. Thyroid Function Tests: No results for input(s): TSH, T4TOTAL, FREET4, T3FREE, THYROIDAB in the last 72 hours. Anemia Panel: No results for input(s): VITAMINB12, FOLATE, FERRITIN, TIBC, IRON, RETICCTPCT in the last 72 hours. Urine analysis:    Component Value Date/Time   COLORURINE  YELLOW 03/26/2016 [place] 03/26/2016 1232   LABSPEC 1.005 03/26/2016 1232   PHURINE 7.0 03/26/2016 1232   GLUCOSEU 150 (A) 03/26/2016 1232   HGBUR MODERATE (A) 03/26/2016 [place] 03/26/2016 1232   BILIRUBINUR neg 06/02/2013 1540   KETONESUR NEGATIVE 03/26/2016 1232   PROTEINUR >=300 (A) 03/26/2016 1232   UROBILINOGEN 0.2 12/14/2014 1700   NITRITE NEGATIVE 03/26/2016 1232   LEUKOCYTESUR TRACE (A) 03/26/2016 1232   Sepsis Labs: Invalid input(s): PROCALCITONIN, LACTICIDVEN  Recent Results (from the past 240 hour(s))  Culture, blood (routine x 2) Call MD if unable to obtain prior to antibiotics being given     Status: None (Preliminary result)   Collection Time: 03/26/16  2:14 AM  Result Value Ref Range Status   Specimen Description BLOOD LEFT HAND  Final   Special Requests IN PEDIATRIC BOTTLE 4CC  Final   Culture NO GROWTH 3 DAYS  Final   Report Status PENDING  Incomplete  Culture, blood (routine x 2) Call MD if unable to obtain prior to antibiotics being given     Status: None (Preliminary result)   Collection Time: 03/26/16  2:26 AM  Result Value Ref Range Status   Specimen Description BLOOD RIGHT HAND  Final   Special Requests IN PEDIATRIC BOTTLE 4CC  Final   Culture NO GROWTH 3 DAYS  Final   Report Status PENDING  Incomplete  Culture, sputum-assessment     Status: None   Collection Time: 03/26/16 12:32 PM  Result Value Ref Range Status   Specimen Description SPUTUM  Final   Special Requests Normal  Final   Sputum evaluation   Final    MICROSCOPIC FINDINGS SUGGEST THAT THIS SPECIMEN IS NOT REPRESENTATIVE OF LOWER RESPIRATORY SECRETIONS. PLEASE RECOLLECT. Results Called to: RN [facility]  [phone] MLM    Report Status 03/26/2016 FINAL  Final  Urine culture     Status: None   Collection Time: 03/26/16 12:32 PM  Result Value Ref Range Status   Specimen Description URINE, CATHETERIZED  Final   Special Requests NONE  Final   Culture  NO GROWTH  Final   Report Status 03/27/2016 FINAL  Final  Respiratory Panel by PCR     Status: None  Collection Time: 03/26/16 10:40 PM  Result Value Ref Range Status   Adenovirus NOT DETECTED NOT DETECTED Final   Coronavirus 229E NOT DETECTED NOT DETECTED Final   Coronavirus HKU1 NOT DETECTED NOT DETECTED Final   Coronavirus NL63 NOT DETECTED NOT DETECTED Final   Coronavirus OC43 NOT DETECTED NOT DETECTED Final   Metapneumovirus NOT DETECTED NOT DETECTED Final   Rhinovirus / Enterovirus NOT DETECTED NOT DETECTED Final   Influenza A NOT DETECTED NOT DETECTED Final   Influenza B NOT DETECTED NOT DETECTED Final   Parainfluenza Virus 1 NOT DETECTED NOT DETECTED Final   Parainfluenza Virus 2 NOT DETECTED NOT DETECTED Final   Parainfluenza Virus 3 NOT DETECTED NOT DETECTED Final   Parainfluenza Virus 4 NOT DETECTED NOT DETECTED Final   Respiratory Syncytial Virus NOT DETECTED NOT DETECTED Final   Bordetella pertussis NOT DETECTED NOT DETECTED Final   Chlamydophila pneumoniae NOT DETECTED NOT DETECTED Final   Mycoplasma pneumoniae NOT DETECTED NOT DETECTED Final    Radiology Studies: Dg Chest Port 1 View  Result Date: 03/29/2016 CLINICAL DATA:  Shortness of Breath EXAM: PORTABLE CHEST 1 VIEW COMPARISON:  03/27/2016 FINDINGS: Prior CABG. Mild cardiomegaly and vascular congestion. Interstitial prominence within the lungs could reflect mild interstitial edema, similar to prior study. Lingular subsegmental atelectasis or scarring. IMPRESSION: Suspect mild interstitial edema, similar to prior study. Lingular atelectasis or scarring. Electronically Signed   By: Rolm Baptise M.D.   On: 03/29/2016 12:37   Scheduled Meds: . allopurinol  300 mg Oral QPM  . amLODipine  10 mg Oral Daily  . aspirin  81 mg Oral Daily  . atorvastatin  40 mg Oral q1800  . cloNIDine  0.2 mg Oral TID  . clopidogrel  75 mg Oral Daily  . enoxaparin (LOVENOX) injection  30 mg Subcutaneous Q24H  . ezetimibe  10 mg Oral  Daily  . famotidine  10 mg Oral BID  . furosemide  80 mg Oral Daily  . gabapentin  400 mg Oral QHS  . hydrOXYzine  25 mg Oral TID  . insulin aspart  0-5 Units Subcutaneous QHS  . insulin aspart  0-9 Units Subcutaneous TID WC  . insulin glargine  25 Units Subcutaneous QHS  . isosorbide mononitrate  120 mg Oral Daily  . levofloxacin  500 mg Oral Q48H  . levothyroxine  25 mcg Oral QAC breakfast  . loratadine  10 mg Oral Daily  . metoprolol succinate  150 mg Oral Daily   Continuous Infusions:  Marzetta Board, MD, PhD [facility] Pager [phone]  If 7PM-7AM, please contact night-coverage [email or web address] Password [email or web address] 03/30/2016, 1:32 PM

## 2016-03-30 NOTE — Progress Notes (Signed)
Admit: 03/25/2016 LOS: 4  83F with AoCKD4 likely from overdiuresis with progressive CKD, numerous comorbidities  Subjective:  Stable on RA, including with ambulation Good UOP, Improved SCr Fels improved  12/15 0701 - 12/16 0700 In: 240 [P.O.:240] Out: 1475 [Urine:1475]  Filed Weights   03/27/16 0636 03/29/16 0611 03/30/16 0553  Weight: 109.9 kg (242 lb 4.8 oz) 109.5 kg (241 lb 8 oz) 109.4 kg (241 lb 1.6 oz)    Scheduled Meds: . allopurinol  300 mg Oral QPM  . amLODipine  10 mg Oral Daily  . aspirin  81 mg Oral Daily  . atorvastatin  40 mg Oral q1800  . cloNIDine  0.2 mg Oral TID  . clopidogrel  75 mg Oral Daily  . enoxaparin (LOVENOX) injection  30 mg Subcutaneous Q24H  . ezetimibe  10 mg Oral Daily  . famotidine  10 mg Oral BID  . gabapentin  400 mg Oral QHS  . hydrOXYzine  25 mg Oral TID  . insulin aspart  0-5 Units Subcutaneous QHS  . insulin aspart  0-9 Units Subcutaneous TID WC  . insulin glargine  25 Units Subcutaneous QHS  . isosorbide mononitrate  120 mg Oral Daily  . levofloxacin  500 mg Oral Q48H  . levothyroxine  25 mcg Oral QAC breakfast  . loratadine  10 mg Oral Daily  . metoprolol succinate  150 mg Oral Daily   Continuous Infusions: PRN Meds:.acetaminophen **OR** acetaminophen, acetaminophen-codeine, albuterol, benzonatate, calcium carbonate (dosed in mg elemental calcium), camphor-menthol **AND** [DISCONTINUED] hydrOXYzine, docusate sodium, feeding supplement (NEPRO CARB STEADY), fluticasone, naphazoline-glycerin, ondansetron **OR** ondansetron (ZOFRAN) IV, senna-docusate, sorbitol, technetium TC 12M diethylenetriame-pentaacetic acid, traMADol, traZODone, zolpidem  Current Labs: reviewed    Physical Exam:  Blood pressure (!) 168/92, pulse 78, temperature 97.9 F (36.6 C), temperature source Oral, resp. rate 18, height 5' 10.5" (1.791 m), weight 109.4 kg (241 lb 1.6 oz), last menstrual period 10/13/2014, SpO2 98 %. GEN: NAD, sititng in chair in NAD ENT:  NCAT EYES: EOMI CV: RRR, PULM: nl WOB.   ABD: s/nt/nd SKIN: no rashes/lesons RKY:HCWCB [name]  A 1. AoCKD4; BL SCr aroudn 3; [place] CKA 2. CAP on levoflox 3. Acute S/D CHF Exacerbation s/p diuretics 4. COPD not on home O2 5. ASCVD hx/o CAD/CABG 6. Obesity 7. DM2 8. HTN 9. Anemia; TSAT 20 and Ferritin 91 12/5; normocytic; likely CKD related and worsened with acute illness  P  Ok with continued diuresis, trial of lasix 80mg  PO BID  Will s/o for now and arrange outpt f/u with Dr. Lorrene Reid  Daily weights, Daily Renal Panel, Strict I/Os, Avoid nephrotoxins (NSAIDs, judicious IV Contrast)    Pearson Grippe MD 03/30/2016, 8:59 AM   Recent Labs Lab 03/28/16 0222 03/29/16 0934 03/30/16 0228  NA 135 138 136  K 4.0 3.8 4.1  CL 101 105 103  CO2 29 25 24   GLUCOSE 140* 154* 132*  BUN 45* 46* 47*  CREATININE 4.54* 4.53* 4.33*  CALCIUM 8.5* 8.9 9.0    Recent Labs Lab 03/26/16 0145 03/27/16 0147 03/28/16 0222  WBC 13.4* 8.1 7.1  NEUTROABS 12.2*  --   --   HGB 9.5* 8.1* 7.8*  HCT 29.7* 24.9* 24.4*  MCV 95.8 95.8 96.1  PLT 231 205 196

## 2016-03-31 LAB — CULTURE, BLOOD (ROUTINE X 2)
CULTURE: NO GROWTH
Culture: NO GROWTH

## 2016-03-31 LAB — BASIC METABOLIC PANEL
ANION GAP: 9 (ref 5–15)
BUN: 49 mg/dL — ABNORMAL HIGH (ref 6–20)
CALCIUM: 9.1 mg/dL (ref 8.9–10.3)
CO2: 24 mmol/L (ref 22–32)
CREATININE: 4.09 mg/dL — AB (ref 0.44–1.00)
Chloride: 106 mmol/L (ref 101–111)
GFR, EST AFRICAN AMERICAN: 13 mL/min — AB (ref >60)
GFR, EST NON AFRICAN AMERICAN: 11 mL/min — AB (ref >60)
Glucose, Bld: 159 mg/dL — ABNORMAL HIGH (ref 65–99)
Potassium: 3.7 mmol/L (ref 3.5–5.1)
SODIUM: 139 mmol/L (ref 135–145)

## 2016-03-31 LAB — GLUCOSE, CAPILLARY
GLUCOSE-CAPILLARY: 142 mg/dL — AB (ref 65–99)
GLUCOSE-CAPILLARY: 172 mg/dL — AB (ref 65–99)
Glucose-Capillary: 132 mg/dL — ABNORMAL HIGH (ref 65–99)
Glucose-Capillary: 140 mg/dL — ABNORMAL HIGH (ref 65–99)

## 2016-03-31 NOTE — Progress Notes (Signed)
PROGRESS NOTE  Brooke Stafford RJJ:[id number] DOB: 12/15/1955 DOA: 03/25/2016 PCP: Angelica Chessman, MD   LOS: 5 days   Brief Narrative: 60 year old female with history of coronary artery disease, COPD, stroke, diabetes mellitus, CKD stage IV presented with 1 month history of nonproductive cough and shortness of breath that has worsened significantly in the past 3-4 days. The patient has had numerous ED visits for similar symptoms. On 02/24/2016, the patient was given Tessalon Perles and azithromycin for bronchitis. The patient previous to the emergency department on 03/20/2016. The patient was given a dose of intravenous furosemide and discharged home with increased dose of Lasix--60 mg a.m., 40 mg p.m. unfortunately, the patient continues to complain of worsening shortness of breath with orthopnea type symptoms. She also complains of increasing lower extremity edema and increasing abdominal girth as well as orthopnea type symptoms. She endorses compliance with the furosemide, but states that she drinks "plenty of fluid". She has had subjective fevers and chills without any headache, chest pain, abdominal pain, dysuria, hematuria, diarrhea.  Upon presentation, the patient was noted to have WBC 15.8, but was afebrile and hemodynamically stable. She was also hypoxemic with oxygen saturation of 70% when EMS arrived, but was weaned to 4 L nasal cannula in the emergency department. The patient was given furosemide IV and levofloxacin.  Assessment & Plan: Principal Problem:   Acute respiratory failure with hypoxia (HCC) Active Problems:   Hypothyroidism   CKD (chronic kidney disease) stage 5, GFR less than 15 ml/min (HCC)   Essential hypertension   Chronic combined systolic and diastolic congestive heart failure (HCC)   Diabetes mellitus with circulatory complication, with long-term current use of insulin (HCC)   Anemia of chronic disease   CAP (community acquired pneumonia)   Acute on chronic  combined systolic and diastolic CHF (congestive heart failure) (HCC)   Chronic kidney disease (CKD), stage IV (severe) (HCC)   Acute respiratory failure with hypoxia - Secondary to CHF exacerbation vs  CAP - Improving, continue diuresis with Lasix, transitioned to by mouth and monitor closely renal function  Acute on chronic systolic and diastolic CHF - 01/60/1093 echo EF 40-45%, grade 1 DD - On 12/12 she received furosemide 40 mg 2 as well as 60 mg x 1, with resultant Cr elevation  - she states that she has gained 10 pounds over the past week, place patient on fluid restriction - review of records show dry weight ~ 235 - admission weight 242, 241 >> 241>> 239 today - cannot use ACEi due to CKD and has "allergy" to hydralazine  Leukocytosis/RLL Opacity - Likely stress demargination - The patient is afebrile hemodynamically stable - with elevated procalcitonin--4.81, cannot r/o underlying CAP - plan levofloxacin x 5 days, today is last dose - viral respiratory panel - UA--no pyuria  CKD stage IV - The patient has had a progression of her CKD, her creatinine increased from baseline of 3.4 yesterday to 4.4 today after Lasix, and she hasn't lost weight and clinically looks fluid overloaded, I have consulted nephrology, appreciate input - However, in May 2017 baseline was 2.4-2.7 - continue Lasix 80 po daily - Renal function is pending this morning  Elevated troponin/ CAD - secondary to demand ischemia from CHF decompensation, elevation not in a pattern consistent with ACS - no angina - continue ASA and plavix  Diabetes mellitus insulin dependent - continue reduced dose lantus inpatient - novolog ISS - 03/15/2016, A1c 5.9  Hypertension - Continue metoprolol succinate, Imdur, clonidine, amlodipine - anticipate  improvement with diuresis - BP acceptable this morning, continue current regimen   COPD - continue bronchodilators - stable - 20 pk year hx tobacco - may need  oxygen at home, to be determined on the day of discharge  Hyperlipidemia - continue statin  Hypokalemia - replete - check mag  Elevated D-Dimer  - V/Q scan negative - venous duplex legs negative for DVT  Deconditioning  - Patient very concerned today about her ability to function at home,  - Advised physical therapy to evaluate, this is pending   DVT prophylaxis: Lovenox Code Status: Full code Family Communication: no family bedside Disposition Plan: home when ready   Consultants:   Nephrology   Procedures:   None   Antimicrobials:  Levaquin 12/12 >>   Subjective: - Feels a little bit better, cough is getting better. Denies any shortness of breath or chest pain.  Objective: Vitals:   03/30/16 1404 03/30/16 2011 03/30/16 2224 03/31/16 0426  BP: (!) 146/57 (!) 156/62 (!) 163/76 (!) 159/64  Pulse: 63 66 63 66  Resp: 19 18  20   Temp: 99.7 F (37.6 C) 99.1 F (37.3 C)  98.7 F (37.1 C)  TempSrc: Oral Oral  Oral  SpO2: 97% 97%  97%  Weight:    108.5 kg (239 lb 4.8 oz)  Height:        Intake/Output Summary (Last 24 hours) at 03/31/16 1101 Last data filed at 03/31/16 0753  Gross per 24 hour  Intake              240 ml  Output             3000 ml  Net            -2760 ml   Filed Weights   03/29/16 0611 03/30/16 0553 03/31/16 0426  Weight: 109.5 kg (241 lb 8 oz) 109.4 kg (241 lb 1.6 oz) 108.5 kg (239 lb 4.8 oz)    Examination: Constitutional: NAD Vitals:   03/30/16 1404 03/30/16 2011 03/30/16 2224 03/31/16 0426  BP: (!) 146/57 (!) 156/62 (!) 163/76 (!) 159/64  Pulse: 63 66 63 66  Resp: 19 18  20   Temp: 99.7 F (37.6 C) 99.1 F (37.3 C)  98.7 F (37.1 C)  TempSrc: Oral Oral  Oral  SpO2: 97% 97%  97%  Weight:    108.5 kg (239 lb 4.8 oz)  Height:       Eyes: PERRL, lids and conjunctivae normal ENMT: Mucous membranes are moist. No oropharyngeal exudates Respiratory: clear to auscultation bilaterally, no wheezing, no crackles. Normal respiratory  effort. No accessory muscle use.  Cardiovascular: Regular rate and rhythm, no murmurs / rubs / gallops. Trace LE edema. 2+ pedal pulses.    Data Reviewed: I have personally reviewed following labs and imaging studies  CBC:  Recent Labs Lab 03/25/16 2243 03/26/16 0145 03/27/16 0147 03/28/16 0222  WBC 15.8* 13.4* 8.1 7.1  NEUTROABS  --  12.2*  --   --   HGB 10.1* 9.5* 8.1* 7.8*  HCT 30.7* 29.7* 24.9* 24.4*  MCV 95.3 95.8 95.8 96.1  PLT 234 231 205 350   Basic Metabolic Panel:  Recent Labs Lab 03/26/16 0145 03/27/16 0147 03/28/16 0222 03/29/16 0934 03/30/16 0228  NA 138 137 135 138 136  K 3.4* 4.6 4.0 3.8 4.1  CL 102 102 101 105 103  CO2 25 28 29 25 24   GLUCOSE 155* 186* 140* 154* 132*  BUN 33* 42* 45* 46* 47*  CREATININE 3.40*  4.37* 4.54* 4.53* 4.33*  CALCIUM 8.9 8.7* 8.5* 8.9 9.0  MG  --  3.1*  --   --   --    GFR: Estimated Creatinine Clearance: 18.4 mL/min (by C-G formula based on SCr of 4.33 mg/dL (H)). Liver Function Tests:  Recent Labs Lab 03/28/16 0222  AST 15  ALT 19  ALKPHOS 78  BILITOT 0.6  PROT 5.8*  ALBUMIN 2.6*   No results for input(s): LIPASE, AMYLASE in the last 168 hours. No results for input(s): AMMONIA in the last 168 hours. Coagulation Profile: No results for input(s): INR, PROTIME in the last 168 hours. Cardiac Enzymes:  Recent Labs Lab 03/26/16 0145 03/26/16 0806 03/26/16 1524 03/27/16 0847  TROPONINI 0.14* 0.47* 0.85* 0.70*   BNP (last 3 results) No results for input(s): PROBNP in the last 8760 hours. HbA1C: No results for input(s): HGBA1C in the last 72 hours. CBG:  Recent Labs Lab 03/30/16 0639 03/30/16 1103 03/30/16 1619 03/30/16 2114 03/31/16 0626  GLUCAP 124* 175* 131* 179* 132*   Lipid Profile: No results for input(s): CHOL, HDL, LDLCALC, TRIG, CHOLHDL, LDLDIRECT in the last 72 hours. Thyroid Function Tests: No results for input(s): TSH, T4TOTAL, FREET4, T3FREE, THYROIDAB in the last 72 hours. Anemia  Panel: No results for input(s): VITAMINB12, FOLATE, FERRITIN, TIBC, IRON, RETICCTPCT in the last 72 hours. Urine analysis:    Component Value Date/Time   COLORURINE YELLOW 03/26/2016 [place] 03/26/2016 1232   LABSPEC 1.005 03/26/2016 1232   PHURINE 7.0 03/26/2016 1232   GLUCOSEU 150 (A) 03/26/2016 1232   HGBUR MODERATE (A) 03/26/2016 [place] 03/26/2016 1232   BILIRUBINUR neg 06/02/2013 1540   KETONESUR NEGATIVE 03/26/2016 1232   PROTEINUR >=300 (A) 03/26/2016 1232   UROBILINOGEN 0.2 12/14/2014 1700   NITRITE NEGATIVE 03/26/2016 1232   LEUKOCYTESUR TRACE (A) 03/26/2016 1232   Sepsis Labs: Invalid input(s): PROCALCITONIN, LACTICIDVEN  Recent Results (from the past 240 hour(s))  Culture, blood (routine x 2) Call MD if unable to obtain prior to antibiotics being given     Status: None (Preliminary result)   Collection Time: 03/26/16  2:14 AM  Result Value Ref Range Status   Specimen Description BLOOD LEFT HAND  Final   Special Requests IN PEDIATRIC BOTTLE 4CC  Final   Culture NO GROWTH 4 DAYS  Final   Report Status PENDING  Incomplete  Culture, blood (routine x 2) Call MD if unable to obtain prior to antibiotics being given     Status: None (Preliminary result)   Collection Time: 03/26/16  2:26 AM  Result Value Ref Range Status   Specimen Description BLOOD RIGHT HAND  Final   Special Requests IN PEDIATRIC BOTTLE 4CC  Final   Culture NO GROWTH 4 DAYS  Final   Report Status PENDING  Incomplete  Culture, sputum-assessment     Status: None   Collection Time: 03/26/16 12:32 PM  Result Value Ref Range Status   Specimen Description SPUTUM  Final   Special Requests Normal  Final   Sputum evaluation   Final    MICROSCOPIC FINDINGS SUGGEST THAT THIS SPECIMEN IS NOT REPRESENTATIVE OF LOWER RESPIRATORY SECRETIONS. PLEASE RECOLLECT. Results Called to: RN [facility]  [id number] MLM    Report Status 03/26/2016 FINAL  Final  Urine culture      Status: None   Collection Time: 03/26/16 12:32 PM  Result Value Ref Range Status   Specimen Description URINE, CATHETERIZED  Final   Special Requests NONE  Final   Culture NO GROWTH  Final   Report Status 03/27/2016 FINAL  Final  Respiratory Panel by PCR     Status: None   Collection Time: 03/26/16 10:40 PM  Result Value Ref Range Status   Adenovirus NOT DETECTED NOT DETECTED Final   Coronavirus 229E NOT DETECTED NOT DETECTED Final   Coronavirus HKU1 NOT DETECTED NOT DETECTED Final   Coronavirus NL63 NOT DETECTED NOT DETECTED Final   Coronavirus OC43 NOT DETECTED NOT DETECTED Final   Metapneumovirus NOT DETECTED NOT DETECTED Final   Rhinovirus / Enterovirus NOT DETECTED NOT DETECTED Final   Influenza A NOT DETECTED NOT DETECTED Final   Influenza B NOT DETECTED NOT DETECTED Final   Parainfluenza Virus 1 NOT DETECTED NOT DETECTED Final   Parainfluenza Virus 2 NOT DETECTED NOT DETECTED Final   Parainfluenza Virus 3 NOT DETECTED NOT DETECTED Final   Parainfluenza Virus 4 NOT DETECTED NOT DETECTED Final   Respiratory Syncytial Virus NOT DETECTED NOT DETECTED Final   Bordetella pertussis NOT DETECTED NOT DETECTED Final   Chlamydophila pneumoniae NOT DETECTED NOT DETECTED Final   Mycoplasma pneumoniae NOT DETECTED NOT DETECTED Final    Radiology Studies: Dg Chest Port 1 View  Result Date: 03/29/2016 CLINICAL DATA:  Shortness of Breath EXAM: PORTABLE CHEST 1 VIEW COMPARISON:  03/27/2016 FINDINGS: Prior CABG. Mild cardiomegaly and vascular congestion. Interstitial prominence within the lungs could reflect mild interstitial edema, similar to prior study. Lingular subsegmental atelectasis or scarring. IMPRESSION: Suspect mild interstitial edema, similar to prior study. Lingular atelectasis or scarring. Electronically Signed   By: Rolm Baptise M.D.   On: 03/29/2016 12:37   Scheduled Meds: . allopurinol  300 mg Oral QPM  . amLODipine  10 mg Oral Daily  . aspirin  81 mg Oral Daily  .  atorvastatin  40 mg Oral q1800  . cloNIDine  0.2 mg Oral TID  . clopidogrel  75 mg Oral Daily  . enoxaparin (LOVENOX) injection  30 mg Subcutaneous Q24H  . ezetimibe  10 mg Oral Daily  . famotidine  10 mg Oral BID  . furosemide  80 mg Oral Daily  . gabapentin  400 mg Oral QHS  . hydrOXYzine  25 mg Oral TID  . insulin aspart  0-5 Units Subcutaneous QHS  . insulin aspart  0-9 Units Subcutaneous TID WC  . insulin glargine  25 Units Subcutaneous QHS  . isosorbide mononitrate  120 mg Oral Daily  . levofloxacin  500 mg Oral Q48H  . levothyroxine  25 mcg Oral QAC breakfast  . loratadine  10 mg Oral Daily  . metoprolol succinate  150 mg Oral Daily   Continuous Infusions:  Marzetta Board, MD, PhD [facility] Pager [phone]  If 7PM-7AM, please contact night-coverage [email or web address] Password [email or web address] 03/31/2016, 11:01 AM

## 2016-04-01 ENCOUNTER — Other Ambulatory Visit: Payer: Self-pay

## 2016-04-01 ENCOUNTER — Emergency Department (HOSPITAL_COMMUNITY): Payer: Medicare Other

## 2016-04-01 ENCOUNTER — Telehealth: Payer: Self-pay

## 2016-04-01 ENCOUNTER — Ambulatory Visit: Payer: Self-pay

## 2016-04-01 ENCOUNTER — Emergency Department (HOSPITAL_COMMUNITY)
Admission: EM | Admit: 2016-04-01 | Discharge: 2016-04-01 | Disposition: A | Discharge status: Home / Self Care | Payer: Medicare Other | Attending: Emergency Medicine | Admitting: Emergency Medicine

## 2016-04-01 ENCOUNTER — Encounter (HOSPITAL_COMMUNITY): Payer: Self-pay | Admitting: Emergency Medicine

## 2016-04-01 DIAGNOSIS — I509 Heart failure, unspecified: Secondary | ICD-10-CM | POA: Insufficient documentation

## 2016-04-01 DIAGNOSIS — Z7982 Long term (current) use of aspirin: Secondary | ICD-10-CM | POA: Insufficient documentation

## 2016-04-01 DIAGNOSIS — Z955 Presence of coronary angioplasty implant and graft: Secondary | ICD-10-CM | POA: Insufficient documentation

## 2016-04-01 DIAGNOSIS — R0602 Shortness of breath: Secondary | ICD-10-CM | POA: Insufficient documentation

## 2016-04-01 DIAGNOSIS — N189 Chronic kidney disease, unspecified: Secondary | ICD-10-CM | POA: Insufficient documentation

## 2016-04-01 DIAGNOSIS — E1122 Type 2 diabetes mellitus with diabetic chronic kidney disease: Secondary | ICD-10-CM | POA: Diagnosis not present

## 2016-04-01 DIAGNOSIS — Z5321 Procedure and treatment not carried out due to patient leaving prior to being seen by health care provider: Secondary | ICD-10-CM | POA: Insufficient documentation

## 2016-04-01 DIAGNOSIS — I252 Old myocardial infarction: Secondary | ICD-10-CM | POA: Diagnosis not present

## 2016-04-01 DIAGNOSIS — Z87891 Personal history of nicotine dependence: Secondary | ICD-10-CM | POA: Insufficient documentation

## 2016-04-01 DIAGNOSIS — J45909 Unspecified asthma, uncomplicated: Secondary | ICD-10-CM | POA: Insufficient documentation

## 2016-04-01 DIAGNOSIS — R079 Chest pain, unspecified: Secondary | ICD-10-CM | POA: Diagnosis not present

## 2016-04-01 DIAGNOSIS — E039 Hypothyroidism, unspecified: Secondary | ICD-10-CM | POA: Insufficient documentation

## 2016-04-01 DIAGNOSIS — I2510 Atherosclerotic heart disease of native coronary artery without angina pectoris: Secondary | ICD-10-CM | POA: Insufficient documentation

## 2016-04-01 DIAGNOSIS — Z951 Presence of aortocoronary bypass graft: Secondary | ICD-10-CM | POA: Insufficient documentation

## 2016-04-01 DIAGNOSIS — I130 Hypertensive heart and chronic kidney disease with heart failure and stage 1 through stage 4 chronic kidney disease, or unspecified chronic kidney disease: Secondary | ICD-10-CM | POA: Insufficient documentation

## 2016-04-01 DIAGNOSIS — Z794 Long term (current) use of insulin: Secondary | ICD-10-CM | POA: Insufficient documentation

## 2016-04-01 LAB — BASIC METABOLIC PANEL
ANION GAP: 8 (ref 5–15)
BUN: 48 mg/dL — ABNORMAL HIGH (ref 6–20)
CALCIUM: 8.7 mg/dL — AB (ref 8.9–10.3)
CHLORIDE: 105 mmol/L (ref 101–111)
CO2: 26 mmol/L (ref 22–32)
Creatinine, Ser: 4.17 mg/dL — ABNORMAL HIGH (ref 0.44–1.00)
GFR calc non Af Amer: 11 mL/min — ABNORMAL LOW (ref >60)
GFR, EST AFRICAN AMERICAN: 12 mL/min — AB (ref >60)
Glucose, Bld: 144 mg/dL — ABNORMAL HIGH (ref 65–99)
POTASSIUM: 4 mmol/L (ref 3.5–5.1)
Sodium: 139 mmol/L (ref 135–145)

## 2016-04-01 LAB — CBC WITH DIFFERENTIAL/PLATELET
Basophils Absolute: 0 10*3/uL (ref 0.0–0.1)
Basophils Relative: 0 %
EOS ABS: 0.3 10*3/uL (ref 0.0–0.7)
EOS PCT: 3 %
HCT: 25.3 % — ABNORMAL LOW (ref 36.0–46.0)
HEMOGLOBIN: 8.5 g/dL — AB (ref 12.0–15.0)
LYMPHS ABS: 0.7 10*3/uL (ref 0.7–4.0)
LYMPHS PCT: 6 %
MCH: 31 pg (ref 26.0–34.0)
MCHC: 33.6 g/dL (ref 30.0–36.0)
MCV: 92.3 fL (ref 78.0–100.0)
MONOS PCT: 3 %
Monocytes Absolute: 0.4 10*3/uL (ref 0.1–1.0)
Neutro Abs: 10 10*3/uL — ABNORMAL HIGH (ref 1.7–7.7)
Neutrophils Relative %: 88 %
Platelets: 283 10*3/uL (ref 150–400)
RBC: 2.74 MIL/uL — ABNORMAL LOW (ref 3.87–5.11)
RDW: 14.3 % (ref 11.5–15.5)
WBC: 11.5 10*3/uL — AB (ref 4.0–10.5)

## 2016-04-01 LAB — COMPREHENSIVE METABOLIC PANEL
ALK PHOS: 100 U/L (ref 38–126)
ALT: 24 U/L (ref 14–54)
ANION GAP: 8 (ref 5–15)
AST: 24 U/L (ref 15–41)
Albumin: 3.1 g/dL — ABNORMAL LOW (ref 3.5–5.0)
BUN: 52 mg/dL — ABNORMAL HIGH (ref 6–20)
CALCIUM: 9.1 mg/dL (ref 8.9–10.3)
CO2: 23 mmol/L (ref 22–32)
Chloride: 108 mmol/L (ref 101–111)
Creatinine, Ser: 4.29 mg/dL — ABNORMAL HIGH (ref 0.44–1.00)
GFR, EST AFRICAN AMERICAN: 12 mL/min — AB (ref >60)
GFR, EST NON AFRICAN AMERICAN: 10 mL/min — AB (ref >60)
Glucose, Bld: 157 mg/dL — ABNORMAL HIGH (ref 65–99)
Potassium: 3.9 mmol/L (ref 3.5–5.1)
Sodium: 139 mmol/L (ref 135–145)
TOTAL PROTEIN: 6.5 g/dL (ref 6.5–8.1)
Total Bilirubin: 0.6 mg/dL (ref 0.3–1.2)

## 2016-04-01 LAB — GLUCOSE, CAPILLARY
GLUCOSE-CAPILLARY: 184 mg/dL — AB (ref 65–99)
Glucose-Capillary: 105 mg/dL — ABNORMAL HIGH (ref 65–99)

## 2016-04-01 LAB — BRAIN NATRIURETIC PEPTIDE: B NATRIURETIC PEPTIDE 5: 1074.4 pg/mL — AB (ref 0.0–100.0)

## 2016-04-01 MED ORDER — LEVOFLOXACIN 500 MG PO TABS: 500.0000 mg | ORAL_TABLET | ORAL | 0 refills | Status: DC

## 2016-04-01 NOTE — ED Notes (Signed)
Pt states she was just dc from hospital today.

## 2016-04-01 NOTE — Telephone Encounter (Signed)
Call placed to the patient to remind her of her appointment tomorrow at the [facility]  - 04/02/16 @ 1400. Her sister, Santiago Glad, answered the phone and after she asked who was calling immediately said she would tell the patient about the appointment tomorrow at 1400 before this CM said anything about an appointment.

## 2016-04-01 NOTE — Care Management Important Message (Signed)
Important Message  Patient Details  Name: Brooke Stafford MRN: [id number] Date of Birth: 01-16-1956   Medicare Important Message Given:  Yes    Nathen May 04/01/2016, 10:48 AM

## 2016-04-01 NOTE — ED Triage Notes (Addendum)
Pt to ED via [facility] with c/o shortness of breath.  Pt was seen here earlier today for same. EMS reports pt has a slight wheeze but after one duo neb wheeze subsided.

## 2016-04-01 NOTE — Care Management Note (Signed)
Case Management Note  Patient Details  Name: Brooke Stafford MRN: [id number] Date of Birth: February 26, 1956  Subjective/Objective: 60 yo female admitted with hypoxia and CHF exacerbation.                   Action/Plan: Case manager spoke with patient concerning Discharge plan and Home Health needs. Choice was offered for Home Health agency, Referral was called to [place], [facility] Liaison. CM also contacted Karlyn Agee, [employer] rep to pickup oxygen, patient no longer requires oxygen for home.    Expected Discharge Date:  04/01/16              Expected Discharge Plan:  [place]  In-House Referral:  Clinical Social Work  Discharge planning Services  CM Consult  Post Acute Care Choice:  Home Health Choice offered to:  NA, Patient  DME Arranged:  [place] rolling, Nebulizer machine, Oxygen DME Agency:  [place] Arranged:  RN [facility] Agency:  [place]  Status of Service:  Completed, signed off  If discussed at [place] of Stay Meetings, dates discussed:    Additional Comments:  Ninfa Meeker, RN 04/01/2016, 10:47 AM

## 2016-04-01 NOTE — Discharge Summary (Signed)
Physician Discharge Summary  Brooke Stafford KDT:[id number] DOB: 02/03/1956 DOA: 03/25/2016  PCP: Angelica Chessman, MD  Admit date: 03/25/2016 Discharge date: 04/01/2016  Admitted From: home Disposition:  home  Recommendations for Outpatient Follow-up:  1. Follow up with PCP as scheduled this week 2. Please obtain BMP/CBC in one week  Home Health: PT Equipment/Devices: walker  Discharge Condition: stable CODE STATUS: Full Diet recommendation: diabetic  HPI: per Dr. Lorin Mercy,  Brooke Stafford is a 60 y.o. female with medical history significant of diastolic CHF,CAD s/p CABG,asthma/COPD, CVA in7/2017 with residual right-sided weakness, DM, andanxiety who reports that she couldn't breathe.  Has had bronchitis for a month, and given antibiotics in ER.  Took 5 days' worth as well as cough pills.  Symptoms didn't get better.  Saw Dr. Jimmy Footman last week and he gave more cough pills but ongoing symptoms.  Had PNA before bronchitis, then bronchitis, now pneumonia again. Cough productive of clear sputum, occasionally light yellow.  +chills, no fevers.  No sick contacts. She was admitted from 9/3-8/17 for acute on chronic diastolic CHF (EF 99-83% with grade 1 diastolic dysfunction in 3/82) and hypoxic respiratory failure as a result.  She returned to the ER on 9/9 with abdominal pain and was diagnosed with constipation and peripheral edema and discharged.  She was seen for chest pain on 11/7; she was diagnosed with a viral URI and muscle strain and discharged.  She returned with SOB and palpitations on 11/11 and was diagnosed with bronchitis and given Azithromycin and Tessalon perles.  On 12/6 she returned to the ER for LE edema and had an increase in her Lasix dosing.   Hospital Course: Discharge Diagnoses:  Principal Problem:   Acute respiratory failure with hypoxia (HCC) Active Problems:   Hypothyroidism   CKD (chronic kidney disease) stage 5, GFR less than 15 ml/min (HCC)   Essential  hypertension   Chronic combined systolic and diastolic congestive heart failure (HCC)   Diabetes mellitus with circulatory complication, with long-term current use of insulin (HCC)   Anemia of chronic disease   CAP (community acquired pneumonia)   Acute on chronic combined systolic and diastolic CHF (congestive heart failure) (HCC)   Chronic kidney disease (CKD), stage IV (severe) (HCC)  Acute respiratory failure with hypoxia - Secondary to CHF exacerbation vs CAP, patient was diuresed with IV Lasix >> transitioned to po, weight improved, she was able to be weaned off to room air, oxygen saturation stayed in the 90s with ambulation, she was discharged home in stable condition with close outpatient follow-up. Acute on chronic systolic and diastolic CHF - 50/53/9767 echo EF 40-45%, grade 1 DD. Euvolemic on discharge, continue home Lasix Leukocytosis/RLL Opacity - Likely stress demargination, the patient is afebrile hemodynamically stable,  with elevated procalcitonin--4.81, cannot r/o underlying CAP, received Levaquin while hospitalized, will need 2 additional days on d/c. Viral respiratory panel negative CKD stage IV - The patient has had a progression of her CKD, her creatinine increased from baseline of 3.4 into the 4 range. Nephrology was consulted and have follow-up patient will hospitalize, recommending to continue by mouth Lasix. Suspect patient has a new baseline, I recommend patient follow-up with her primary nephrologist Dr. Lorrene Reid in couple weeks. Elevated troponin/ CAD - secondary to demand ischemia from CHF decompensation, elevation not in a pattern consistent with ACS, no angina, continue ASA and plavix Diabetes mellitus insulin dependent - continue home regimen, 03/15/2016, A1c 5.9 Hypertension - Continue metoprolol succinate, Imdur, clonidine, amlodipine COPD - continue  bronchodilators, stable, no wheezing Hyperlipidemia - continue statin Elevated D-Dimer- V/Q scan negative, venous  duplex legs negative for DVT Deconditioning  - Patient very concerned about her ability to function at home, physical therapy evaluated the patient and recommended home health PT which was arranged prior to discharge.  Discharge Instructions  Discharge Instructions    AMB Referral to [facility]    Complete by:  As directed    Please assign to [place] for transition of care. Recently active with [facility]. Active consent on file. High risk for readmission. Currently at [facility]. [name], [place], RN,BSN [facility] Liaison-[phone]   Reason for consult:  Please assign to [facility] - high risk   Diagnoses of:   COPD/ Pneumonia Heart Failure     Expected date of contact:  1-3 days (reserved for hospital discharges)     Allergies as of 04/01/2016      Reactions   Digoxin And Related Other (See Comments)   Patient stated she almost died. Had flu like symptoms as well as diarrhea.   Hydralazine Shortness Of Breath   Lisinopril Other (See Comments)   Felt like she had the flu. Was very sick!!!   Penicillins Cross Reactors Hives   And high fever   Adhesive [tape] Rash   bruising      Medication List    TAKE these medications   acetaminophen-codeine 300-30 MG tablet Commonly known as:  TYLENOL #3 Take 1 tablet by mouth every 4 (four) hours as needed for moderate pain.   albuterol 108 (90 Base) MCG/ACT inhaler Commonly known as:  PROVENTIL HFA;VENTOLIN HFA Inhale 2 puffs into the lungs every 6 (six) hours as needed for wheezing or shortness of breath.   allopurinol 300 MG tablet Commonly known as:  ZYLOPRIM Take 1 tablet (300 mg total) by mouth daily. What changed:  when to take this   amLODipine 10 MG tablet Commonly known as:  NORVASC take 1 tablet by mouth once daily What changed:  See the new instructions.   aspirin 81 MG chewable tablet Chew 1 tablet (81 mg total) by mouth daily.   atorvastatin 40 MG tablet Commonly  known as:  LIPITOR Take 1 tablet (40 mg total) by mouth daily. What changed:  when to take this   benzonatate 100 MG capsule Commonly known as:  TESSALON Take 1 capsule (100 mg total) by mouth 3 (three) times daily as needed for cough.   CLEAR EYES OP Place 1 drop into both eyes as needed (for dry eyes).   cloNIDine 0.2 MG tablet Commonly known as:  CATAPRES Take 1 tablet (0.2 mg total) by mouth 3 (three) times daily.   clopidogrel 75 MG tablet Commonly known as:  PLAVIX Take 1 tablet (75 mg total) by mouth daily.   ezetimibe 10 MG tablet Commonly known as:  ZETIA Take 1 tablet (10 mg total) by mouth daily.   fluticasone 50 MCG/ACT nasal spray Commonly known as:  FLONASE Place 2 sprays into both nostrils daily as needed for allergies or rhinitis.   furosemide 20 MG tablet Commonly known as:  LASIX Take a total of 60 mg in the morning and 40 mg at night.   gabapentin 400 MG capsule Commonly known as:  NEURONTIN take 1 capsule by mouth three times a day What changed:  See the new instructions.   hydrOXYzine 25 MG tablet Commonly known as:  ATARAX/VISTARIL Take 1 tablet (25 mg total) by mouth 3 (  three) times daily.   insulin aspart 100 UNIT/ML FlexPen Commonly known as:  NOVOLOG FLEXPEN Inject 10-30 units into the skin 3 times daily, plus sliding scale as instructed. What changed:  how much to take  how to take this  when to take this  additional instructions   Insulin Glargine 300 UNIT/ML Sopn Commonly known as:  TOUJEO SOLOSTAR Inject 35 Units into the skin at bedtime. What changed:  how much to take   isosorbide mononitrate 60 MG 24 hr tablet Commonly known as:  IMDUR Take 2 tablets (120 mg total) by mouth daily.   levofloxacin 500 MG tablet Commonly known as:  LEVAQUIN Take 1 tablet (500 mg total) by mouth every other day. Start taking on:  04/02/2016   levothyroxine 25 MCG tablet Commonly known as:  SYNTHROID, LEVOTHROID take 1 tablet by mouth  every morning BEFORE BREAKFAST What changed:  See the new instructions.   loratadine 10 MG tablet Commonly known as:  CLARITIN Take 1 tablet (10 mg total) by mouth daily as needed for allergies. What changed:  when to take this   magnesium citrate Soln Take 296 mLs (1 Bottle total) by mouth as needed for severe constipation.   metoprolol succinate 50 MG 24 hr tablet Commonly known as:  TOPROL-XL Take 3 tablets (150 mg total) by mouth daily. Take with or immediately following a meal.   nitroGLYCERIN 0.4 MG SL tablet Commonly known as:  NITROSTAT Place 1 tablet (0.4 mg total) under the tongue every 5 (five) minutes as needed. For chest   ranitidine 75 MG tablet Commonly known as:  ZANTAC 75 Take 1 tablet (75 mg total) by mouth 2 (two) times daily. What changed:  when to take this  reasons to take this   senna-docusate 8.6-50 MG tablet Commonly known as:  Senokot-S Take 1 tablet by mouth at bedtime as needed for mild constipation.   simethicone 125 MG chewable tablet Commonly known as:  MYLICON Chew 242 mg by mouth every 6 (six) hours as needed for flatulence.   traZODone 50 MG tablet Commonly known as:  DESYREL Take 1 tablet (50 mg total) by mouth at bedtime as needed for sleep.   triamcinolone ointment 0.5 % Commonly known as:  KENALOG Apply 1 application topically 2 (two) times daily. What changed:  when to take this  reasons to take this            Durable Medical Equipment        Start     Ordered   03/29/16 1513  For home use only DME oxygen  Once    Question Answer Comment  Liters per Minute 4   Frequency Continuous (stationary and portable oxygen unit needed)   Oxygen delivery system Gas      03/29/16 1512   03/28/16 1234  For home use only DME Walker rolling  Once    Question:  Patient needs a walker to treat with the following condition  Answer:  Congestive heart failure (CHF) ([place])   03/28/16 1234   03/28/16 1038  For home use only DME  Nebulizer/meds  Once    Question:  Patient needs a nebulizer to treat with the following condition  Answer:  COPD (chronic obstructive pulmonary disease) ([place])   03/28/16 1037     Follow-up Information    [facility] Follow up on 04/02/2016.   Why:  [facility] appointment on 04/02/16 at 2:00 pm with Dr. Jarold Song. Contact information: [address]  [place] [address] [phone]       [facility] - [facility] Follow up.   Why:  oxygen, 3:1 and rolling walker Contact information: [place] [address] [phone]        Lucrezia Starch, MD. Go on 04/04/2016.   Specialty:  Nephrology Why:  appt at 0830 12/21 already scheduled Contact information: [place] [address] [phone]        [facility] Follow up.   Specialty:  [place] Why:  Someone from [facility] will contact you to arrange start date and time for Physical therapy and Respiratory Care. Contact information: [place] [address] [phone]          Allergies  Allergen Reactions  . Digoxin And Related Other (See Comments)    Patient stated she almost died. Had flu like symptoms as well as diarrhea.  . Hydralazine Shortness Of Breath  . Lisinopril Other (See Comments)    Felt like she had the flu. Was very sick!!!  . Penicillins Cross Reactors Hives    And high fever  . Adhesive [Tape] Rash    bruising    Consultations:  Nephrology   Procedures/Studies:  Nm Pulmonary Perf And Vent  Result Date: 03/26/2016 CLINICAL DATA:  Elevated D-dimer, shortness of breath EXAM: NUCLEAR MEDICINE VENTILATION - PERFUSION LUNG SCAN TECHNIQUE: Ventilation images were obtained in multiple projections using inhaled aerosol Tc-51m DTPA. Perfusion images were obtained in multiple projections after intravenous injection of Tc-74m MAA.  RADIOPHARMACEUTICALS:  30.5 mCi Technetium-59m DTPA aerosol inhalation and 3.9 mCi Technetium-67m MAA IV COMPARISON:  Radiograph 03/25/2016 FINDINGS: Ventilation: No focal ventilation defect. Perfusion: No wedge shaped peripheral perfusion defects to suggest acute pulmonary embolism. There is cardiomegaly IMPRESSION: Negative examination Electronically Signed   By: Donavan Foil M.D.   On: 03/26/2016 21:25   Dg Chest Port 1 View  Result Date: 03/29/2016 CLINICAL DATA:  Shortness of Breath EXAM: PORTABLE CHEST 1 VIEW COMPARISON:  03/27/2016 FINDINGS: Prior CABG. Mild cardiomegaly and vascular congestion. Interstitial prominence within the lungs could reflect mild interstitial edema, similar to prior study. Lingular subsegmental atelectasis or scarring. IMPRESSION: Suspect mild interstitial edema, similar to prior study. Lingular atelectasis or scarring. Electronically Signed   By: Rolm Baptise M.D.   On: 03/29/2016 12:37   Dg Chest Port 1 View  Result Date: 03/27/2016 CLINICAL DATA:  Shortness of breath, dyspnea EXAM: PORTABLE CHEST 1 VIEW COMPARISON:  03/25/2016 FINDINGS: Cardiomegaly again noted. Status post CABG. Residual mild interstitial prominence bilaterally probable improving mild interstitial edema. Improvement in aeration right base. Persistent residual atelectasis or infiltrate left base. IMPRESSION: Residual mild interstitial prominence bilaterally probable improving mild interstitial edema. Improvement in aeration right base. Persistent residual atelectasis or infiltrate left base. Electronically Signed   By: Lahoma Crocker M.D.   On: 03/27/2016 08:41   Dg Chest Port 1 View  Result Date: 03/25/2016 CLINICAL DATA:  60 y/o  F; shortness of breath worse today. EXAM: PORTABLE CHEST 1 VIEW COMPARISON:  03/20/2016 chest radiograph FINDINGS: A interval increase in interstitial markings and hazy consolidations of the right greater than left lung bases . Stable linear opacities in left mid lung zone  probably representing atelectasis/ scarring. Post median sternotomy with wires in alignment and CABG. Stable mild cardiomegaly given projection and technique. Aortic atherosclerosis with arch calcification. IMPRESSION: Interval increase in interstitial markings and right greater than left lung base  hazy consolidations probably represents pulmonary edema, multifocal pneumonia is also possible. Stable cardiomegaly. Electronically Signed   By: Kristine Garbe M.D.   On: 03/25/2016 23:13   Dg Abdomen Acute W/chest  Result Date: 03/20/2016 CLINICAL DATA:  Lower extremity edema 2 days ago with abdominal swelling. EXAM: DG ABDOMEN ACUTE W/ 1V CHEST COMPARISON:  CXR 02/24/2016, abdominal ultrasound from 12/18/2015 FINDINGS: Patient is status post CABG. There is stable cardiomegaly with aortic atherosclerosis. Mild left perihilar scarring. No pneumonic consolidation nor overt pulmonary edema. No pneumothorax nor effusion. Bowel gas pattern is unremarkable. No bowel obstruction or free air. No organomegaly nor suspicious calculi. Bi-common iliac artery atherosclerosis. IMPRESSION: Negative abdominal radiographs. Stable cardiomegaly without acute pulmonary disease. Status post CABG. Electronically Signed   By: Ashley Royalty M.D.   On: 03/20/2016 18:55      Subjective: - no chest pain, shortness of breath, no abdominal pain, nausea or vomiting.   Discharge Exam: Vitals:   04/01/16 0638 04/01/16 0928  BP: (!) 162/72 (!) 152/64  Pulse: (!) 56 64  Resp: 20   Temp: 97.7 F (36.5 C)    Vitals:   03/31/16 1933 03/31/16 2144 04/01/16 0638 04/01/16 0928  BP: (!) 170/75 (!) 149/65 (!) 162/72 (!) 152/64  Pulse: 75 64 (!) 56 64  Resp: 20  20   Temp: 99.1 F (37.3 C)  97.7 F (36.5 C)   TempSrc: Oral  Oral   SpO2: 98%  99% 99%  Weight:      Height:        General: Pt is alert, awake, not in acute distress Cardiovascular: RRR, S1/S2 +, no rubs, no gallops Respiratory: CTA bilaterally, no wheezing,  no rhonchi Abdominal: Soft, NT, ND, bowel sounds + Extremities: no edema, no cyanosis    The results of significant diagnostics from this hospitalization (including imaging, microbiology, ancillary and laboratory) are listed below for reference.     Microbiology: Recent Results (from the past 240 hour(s))  Culture, blood (routine x 2) Call MD if unable to obtain prior to antibiotics being given     Status: None   Collection Time: 03/26/16  2:14 AM  Result Value Ref Range Status   Specimen Description BLOOD LEFT HAND  Final   Special Requests IN PEDIATRIC BOTTLE 4CC  Final   Culture NO GROWTH 5 DAYS  Final   Report Status 03/31/2016 FINAL  Final  Culture, blood (routine x 2) Call MD if unable to obtain prior to antibiotics being given     Status: None   Collection Time: 03/26/16  2:26 AM  Result Value Ref Range Status   Specimen Description BLOOD RIGHT HAND  Final   Special Requests IN PEDIATRIC BOTTLE 4CC  Final   Culture NO GROWTH 5 DAYS  Final   Report Status 03/31/2016 FINAL  Final  Culture, sputum-assessment     Status: None   Collection Time: 03/26/16 12:32 PM  Result Value Ref Range Status   Specimen Description SPUTUM  Final   Special Requests Normal  Final   Sputum evaluation   Final    MICROSCOPIC FINDINGS SUGGEST THAT THIS SPECIMEN IS NOT REPRESENTATIVE OF LOWER RESPIRATORY SECRETIONS. PLEASE RECOLLECT. Results Called to: RN [facility]  [id number] MLM    Report Status 03/26/2016 FINAL  Final  Urine culture     Status: None   Collection Time: 03/26/16 12:32 PM  Result Value Ref Range Status   Specimen Description URINE, CATHETERIZED  Final   Special Requests NONE  Final  Culture NO GROWTH  Final   Report Status 03/27/2016 FINAL  Final  Respiratory Panel by PCR     Status: None   Collection Time: 03/26/16 10:40 PM  Result Value Ref Range Status   Adenovirus NOT DETECTED NOT DETECTED Final   Coronavirus 229E NOT DETECTED NOT DETECTED Final   Coronavirus HKU1  NOT DETECTED NOT DETECTED Final   Coronavirus NL63 NOT DETECTED NOT DETECTED Final   Coronavirus OC43 NOT DETECTED NOT DETECTED Final   Metapneumovirus NOT DETECTED NOT DETECTED Final   Rhinovirus / Enterovirus NOT DETECTED NOT DETECTED Final   Influenza A NOT DETECTED NOT DETECTED Final   Influenza B NOT DETECTED NOT DETECTED Final   Parainfluenza Virus 1 NOT DETECTED NOT DETECTED Final   Parainfluenza Virus 2 NOT DETECTED NOT DETECTED Final   Parainfluenza Virus 3 NOT DETECTED NOT DETECTED Final   Parainfluenza Virus 4 NOT DETECTED NOT DETECTED Final   Respiratory Syncytial Virus NOT DETECTED NOT DETECTED Final   Bordetella pertussis NOT DETECTED NOT DETECTED Final   Chlamydophila pneumoniae NOT DETECTED NOT DETECTED Final   Mycoplasma pneumoniae NOT DETECTED NOT DETECTED Final     Labs: BNP (last 3 results)  Recent Labs  02/20/16 1750 03/20/16 1552 03/26/16 0145  BNP 1,657.1* 1,247.0* 3,009.2*   Basic Metabolic Panel:  Recent Labs Lab 03/27/16 0147 03/28/16 0222 03/29/16 0934 03/30/16 0228 03/31/16 1031 04/01/16 0155  NA 137 135 138 136 139 139  K 4.6 4.0 3.8 4.1 3.7 4.0  CL 102 101 105 103 106 105  CO2 28 29 25 24 24 26   GLUCOSE 186* 140* 154* 132* 159* 144*  BUN 42* 45* 46* 47* 49* 48*  CREATININE 4.37* 4.54* 4.53* 4.33* 4.09* 4.17*  CALCIUM 8.7* 8.5* 8.9 9.0 9.1 8.7*  MG 3.1*  --   --   --   --   --    Liver Function Tests:  Recent Labs Lab 03/28/16 0222  AST 15  ALT 19  ALKPHOS 78  BILITOT 0.6  PROT 5.8*  ALBUMIN 2.6*   No results for input(s): LIPASE, AMYLASE in the last 168 hours. No results for input(s): AMMONIA in the last 168 hours. CBC:  Recent Labs Lab 03/25/16 2243 03/26/16 0145 03/27/16 0147 03/28/16 0222  WBC 15.8* 13.4* 8.1 7.1  NEUTROABS  --  12.2*  --   --   HGB 10.1* 9.5* 8.1* 7.8*  HCT 30.7* 29.7* 24.9* 24.4*  MCV 95.3 95.8 95.8 96.1  PLT 234 231 205 196   Cardiac Enzymes:  Recent Labs Lab 03/26/16 0145  03/26/16 0806 03/26/16 1524 03/27/16 0847  TROPONINI 0.14* 0.47* 0.85* 0.70*   BNP: Invalid input(s): POCBNP CBG:  Recent Labs Lab 03/31/16 1139 03/31/16 1725 03/31/16 2104 04/01/16 0637 04/01/16 1020  GLUCAP 142* 172* 140* 105* 184*   D-Dimer No results for input(s): DDIMER in the last 72 hours. Hgb A1c No results for input(s): HGBA1C in the last 72 hours. Lipid Profile No results for input(s): CHOL, HDL, LDLCALC, TRIG, CHOLHDL, LDLDIRECT in the last 72 hours. Thyroid function studies No results for input(s): TSH, T4TOTAL, T3FREE, THYROIDAB in the last 72 hours.  Invalid input(s): FREET3 Anemia work up No results for input(s): VITAMINB12, FOLATE, FERRITIN, TIBC, IRON, RETICCTPCT in the last 72 hours. Urinalysis    Component Value Date/Time   COLORURINE YELLOW 03/26/2016 1232   APPEARANCEUR CLEAR 03/26/2016 1232   LABSPEC 1.005 03/26/2016 1232   PHURINE 7.0 03/26/2016 1232   GLUCOSEU 150 (A) 03/26/2016 1232  HGBUR MODERATE (A) 03/26/2016 1232   BILIRUBINUR NEGATIVE 03/26/2016 1232   BILIRUBINUR neg 06/02/2013 1540   KETONESUR NEGATIVE 03/26/2016 1232   PROTEINUR >=300 (A) 03/26/2016 1232   UROBILINOGEN 0.2 12/14/2014 1700   NITRITE NEGATIVE 03/26/2016 1232   LEUKOCYTESUR TRACE (A) 03/26/2016 1232   Sepsis Labs Invalid input(s): PROCALCITONIN,  WBC,  LACTICIDVEN Microbiology Recent Results (from the past 240 hour(s))  Culture, blood (routine x 2) Call MD if unable to obtain prior to antibiotics being given     Status: None   Collection Time: 03/26/16  2:14 AM  Result Value Ref Range Status   Specimen Description BLOOD LEFT HAND  Final   Special Requests IN PEDIATRIC BOTTLE 4CC  Final   Culture NO GROWTH 5 DAYS  Final   Report Status 03/31/2016 FINAL  Final  Culture, blood (routine x 2) Call MD if unable to obtain prior to antibiotics being given     Status: None   Collection Time: 03/26/16  2:26 AM  Result Value Ref Range Status   Specimen Description  BLOOD RIGHT HAND  Final   Special Requests IN PEDIATRIC BOTTLE 4CC  Final   Culture NO GROWTH 5 DAYS  Final   Report Status 03/31/2016 FINAL  Final  Culture, sputum-assessment     Status: None   Collection Time: 03/26/16 12:32 PM  Result Value Ref Range Status   Specimen Description SPUTUM  Final   Special Requests Normal  Final   Sputum evaluation   Final    MICROSCOPIC FINDINGS SUGGEST THAT THIS SPECIMEN IS NOT REPRESENTATIVE OF LOWER RESPIRATORY SECRETIONS. PLEASE RECOLLECT. Results Called to: RN [facility]  [id number] MLM    Report Status 03/26/2016 FINAL  Final  Urine culture     Status: None   Collection Time: 03/26/16 12:32 PM  Result Value Ref Range Status   Specimen Description URINE, CATHETERIZED  Final   Special Requests NONE  Final   Culture NO GROWTH  Final   Report Status 03/27/2016 FINAL  Final  Respiratory Panel by PCR     Status: None   Collection Time: 03/26/16 10:40 PM  Result Value Ref Range Status   Adenovirus NOT DETECTED NOT DETECTED Final   Coronavirus 229E NOT DETECTED NOT DETECTED Final   Coronavirus HKU1 NOT DETECTED NOT DETECTED Final   Coronavirus NL63 NOT DETECTED NOT DETECTED Final   Coronavirus OC43 NOT DETECTED NOT DETECTED Final   Metapneumovirus NOT DETECTED NOT DETECTED Final   Rhinovirus / Enterovirus NOT DETECTED NOT DETECTED Final   Influenza A NOT DETECTED NOT DETECTED Final   Influenza B NOT DETECTED NOT DETECTED Final   Parainfluenza Virus 1 NOT DETECTED NOT DETECTED Final   Parainfluenza Virus 2 NOT DETECTED NOT DETECTED Final   Parainfluenza Virus 3 NOT DETECTED NOT DETECTED Final   Parainfluenza Virus 4 NOT DETECTED NOT DETECTED Final   Respiratory Syncytial Virus NOT DETECTED NOT DETECTED Final   Bordetella pertussis NOT DETECTED NOT DETECTED Final   Chlamydophila pneumoniae NOT DETECTED NOT DETECTED Final   Mycoplasma pneumoniae NOT DETECTED NOT DETECTED Final     Time coordinating discharge: Over 30  minutes  SIGNED:  Marzetta Board, MD  [facility] 04/01/2016, 2:34 PM Pager [phone]  If 7PM-7AM, please contact night-coverage [email or web address] Password [email or web address]

## 2016-04-01 NOTE — ED Notes (Signed)
Pt seen getting into cab and leaving the ED

## 2016-04-01 NOTE — Progress Notes (Signed)
PRN neb given.  

## 2016-04-01 NOTE — Discharge Instructions (Signed)
Follow with Angelica Chessman, MD in 5-7 days  Please get a complete blood count and chemistry panel checked by your Primary MD at your next visit, and again as instructed by your Primary MD. Please get your medications reviewed and adjusted by your Primary MD.  Please request your Primary MD to go over all Hospital Tests and Procedure/Radiological results at the follow up, please get all Hospital records sent to your Prim MD by signing hospital release before you go home.  If you had Pneumonia of Lung problems at the Hospital: Please get a 2 view Chest X ray done in 6-8 weeks after hospital discharge or sooner if instructed by your Primary MD.  If you have Congestive Heart Failure: Please call your Cardiologist or Primary MD anytime you have any of the following symptoms:  1) 3 pound weight gain in 24 hours or 5 pounds in 1 week  2) shortness of breath, with or without a dry hacking cough  3) swelling in the hands, feet or stomach  4) if you have to sleep on extra pillows at night in order to breathe  Follow cardiac low salt diet and 1.5 lit/day fluid restriction.  If you have diabetes Accuchecks 4 times/day, Once in AM empty stomach and then before each meal. Log in all results and show them to your primary doctor at your next visit. If any glucose reading is under 80 or above 300 call your primary MD immediately.  If you have Seizure/Convulsions/Epilepsy: Please do not drive, operate heavy machinery, participate in activities at heights or participate in high speed sports until you have seen by Primary MD or a Neurologist and advised to do so again.  If you had Gastrointestinal Bleeding: Please ask your Primary MD to check a complete blood count within one week of discharge or at your next visit. Your endoscopic/colonoscopic biopsies that are pending at the time of discharge, will also need to followed by your Primary MD.  Get Medicines reviewed and adjusted. Please take all your  medications with you for your next visit with your Primary MD  Please request your Primary MD to go over all hospital tests and procedure/radiological results at the follow up, please ask your Primary MD to get all Hospital records sent to his/her office.  If you experience worsening of your admission symptoms, develop shortness of breath, life threatening emergency, suicidal or homicidal thoughts you must seek medical attention immediately by calling 911 or calling your MD immediately  if symptoms less severe.  You must read complete instructions/literature along with all the possible adverse reactions/side effects for all the Medicines you take and that have been prescribed to you. Take any new Medicines after you have completely understood and accpet all the possible adverse reactions/side effects.   Do not drive or operate heavy machinery when taking Pain medications.   Do not take more than prescribed Pain, Sleep and Anxiety Medications  Special Instructions: If you have smoked or chewed Tobacco  in the last 2 yrs please stop smoking, stop any regular Alcohol  and or any Recreational drug use.  Wear Seat belts while driving.  Please note You were cared for by a hospitalist during your hospital stay. If you have any questions about your discharge medications or the care you received while you were in the hospital after you are discharged, you can call the unit and asked to speak with the hospitalist on call if the hospitalist that took care of you is not available. Once  you are discharged, your primary care physician will handle any further medical issues. Please note that NO REFILLS for any discharge medications will be authorized once you are discharged, as it is imperative that you return to your primary care physician (or establish a relationship with a primary care physician if you do not have one) for your aftercare needs so that they can reassess your need for medications and monitor your  lab values.  You can reach the hospitalist office at phone [phone] or fax [phone]   If you do not have a primary care physician, you can call [phone] for a physician referral.  Activity: As tolerated with Full fall precautions use walker/cane & assistance as needed  Diet: renal, diabetic  Disposition Home

## 2016-04-01 NOTE — Progress Notes (Signed)
Physical Therapy Treatment Patient Details Name: Brooke Stafford MRN: [id number] DOB: February 14, 1956 Today's Date: 04/01/2016    History of Present Illness 60 yo admitted with hypoxia and CHF exacerbation. PMHx: CAD, CHF, COPD, CKD, CABG    PT Comments    Pt chief concern is going home where her home has mildew and mold per pt report. Pt desires to go to SNF for 2 weeks however pt functioning at modified independent with and without RW. Pt advised she doesn't qualify for SNF placement based off of mobility. PT instructed pt on the need to find a new home or to stay with a friend or family member.   SpO2 >91% on RA during ambulation   Follow Up Recommendations  Home health PT;Supervision for mobility/OOB     Equipment Recommendations  None recommended by PT    Recommendations for Other Services       Precautions / Restrictions Precautions Precautions: None Restrictions Weight Bearing Restrictions: No    Mobility  Bed Mobility               General bed mobility comments: pt sitting EOB upon PT arrival  Transfers Overall transfer level: Modified independent Equipment used: Rolling walker (2 wheeled) Transfers: Sit to/from Stand Sit to Stand: Modified independent (Device/Increase time)         General transfer comment: pt pushed up from bed safely and transitioned hands to RW. pt then later observed ambulating around room without AD  Ambulation/Gait Ambulation/Gait assistance: Supervision Ambulation Distance (Feet): 200 Feet Assistive device: Rolling walker (2 wheeled) Gait Pattern/deviations: Step-through pattern Gait velocity: slow Gait velocity interpretation: Below normal speed for age/gender General Gait Details: v/c's to decrease pressure on UEs, pt required 3 standing rest breaks. SpO2 >91% on RA., no episodes of LOB. pt observed ambulating in room without AD without difficulty   Stairs Stairs: Yes   Stair Management: Two rails;Alternating  pattern;Forwards Number of Stairs: 4 (to mimic home set up) General stair comments: demo'd safe technique  Wheelchair Mobility    Modified Rankin (Stroke Patients Only)       Balance Overall balance assessment: Modified Independent Sitting-balance support: Feet supported;No upper extremity supported Sitting balance-Leahy Scale: Normal     Standing balance support: No upper extremity supported Standing balance-Leahy Scale: Fair                      Cognition Arousal/Alertness: Awake/alert Behavior During Therapy: WFL for tasks assessed/performed Overall Cognitive Status: Within Functional Limits for tasks assessed                      Exercises      General Comments        Pertinent Vitals/Pain Pain Assessment: No/denies pain    Home Living                      Prior Function            PT Goals (current goals can now be found in the care plan section) Progress towards PT goals: Progressing toward goals    Frequency    Min 3X/week      PT Plan Current plan remains appropriate    Co-evaluation             End of Session Equipment Utilized During Treatment: Gait belt Activity Tolerance: Patient tolerated treatment well;Patient limited by fatigue Patient left: in bed;with call bell/phone within reach (sitting EOB)  Time: 1779-3903 PT Time Calculation (min) (ACUTE ONLY): 17 min  Charges:  $Gait Training: 8-22 mins                    G Codes:      Berline Lopes 11-Apr-2016, 8:39 AM   Kittie Plater, PT, DPT Pager #: [phone] Office #: [phone]

## 2016-04-01 NOTE — ED Triage Notes (Signed)
Pt brought to ED by EMS from home for SOB and got one neb treatment PTA to ED. Pt was seen early and dc home. Denies any pain or discomfort at this time.

## 2016-04-01 NOTE — Patient Outreach (Signed)
   Telephone call made to patient after being unable to locate discharge summary in EPIC. Patient advised this RNCM she was discharged today, was just getting home. RNCM advised patient she would be making transition of care call later this to address discharge barriers.

## 2016-04-02 ENCOUNTER — Inpatient Hospital Stay: Payer: Medicare Other | Admitting: Family Medicine

## 2016-04-02 ENCOUNTER — Encounter (HOSPITAL_COMMUNITY): Payer: Self-pay | Admitting: Emergency Medicine

## 2016-04-02 ENCOUNTER — Other Ambulatory Visit: Payer: Self-pay

## 2016-04-02 ENCOUNTER — Emergency Department (HOSPITAL_COMMUNITY)
Admission: EM | Admit: 2016-04-02 | Discharge: 2016-04-02 | Disposition: A | Discharge status: Home / Self Care | Payer: Medicare Other | Attending: Emergency Medicine | Admitting: Emergency Medicine

## 2016-04-02 DIAGNOSIS — I509 Heart failure, unspecified: Secondary | ICD-10-CM

## 2016-04-02 DIAGNOSIS — I252 Old myocardial infarction: Secondary | ICD-10-CM | POA: Diagnosis not present

## 2016-04-02 DIAGNOSIS — Z87891 Personal history of nicotine dependence: Secondary | ICD-10-CM | POA: Insufficient documentation

## 2016-04-02 DIAGNOSIS — I110 Hypertensive heart disease with heart failure: Secondary | ICD-10-CM | POA: Diagnosis not present

## 2016-04-02 DIAGNOSIS — Z79899 Other long term (current) drug therapy: Secondary | ICD-10-CM | POA: Diagnosis not present

## 2016-04-02 DIAGNOSIS — I2510 Atherosclerotic heart disease of native coronary artery without angina pectoris: Secondary | ICD-10-CM | POA: Diagnosis not present

## 2016-04-02 DIAGNOSIS — Z794 Long term (current) use of insulin: Secondary | ICD-10-CM | POA: Diagnosis not present

## 2016-04-02 DIAGNOSIS — I132 Hypertensive heart and chronic kidney disease with heart failure and with stage 5 chronic kidney disease, or end stage renal disease: Secondary | ICD-10-CM | POA: Insufficient documentation

## 2016-04-02 DIAGNOSIS — N185 Chronic kidney disease, stage 5: Secondary | ICD-10-CM | POA: Insufficient documentation

## 2016-04-02 DIAGNOSIS — Z7982 Long term (current) use of aspirin: Secondary | ICD-10-CM | POA: Insufficient documentation

## 2016-04-02 DIAGNOSIS — I5043 Acute on chronic combined systolic (congestive) and diastolic (congestive) heart failure: Secondary | ICD-10-CM | POA: Diagnosis not present

## 2016-04-02 DIAGNOSIS — Z951 Presence of aortocoronary bypass graft: Secondary | ICD-10-CM | POA: Diagnosis not present

## 2016-04-02 DIAGNOSIS — E11319 Type 2 diabetes mellitus with unspecified diabetic retinopathy without macular edema: Secondary | ICD-10-CM | POA: Diagnosis not present

## 2016-04-02 DIAGNOSIS — E119 Type 2 diabetes mellitus without complications: Secondary | ICD-10-CM | POA: Diagnosis not present

## 2016-04-02 DIAGNOSIS — E039 Hypothyroidism, unspecified: Secondary | ICD-10-CM | POA: Diagnosis not present

## 2016-04-02 DIAGNOSIS — R0602 Shortness of breath: Secondary | ICD-10-CM | POA: Diagnosis present

## 2016-04-02 MED ORDER — NAPROXEN 500 MG PO TABS: 500.0000 mg | ORAL_TABLET | Freq: Two times a day (BID) | ORAL | 0 refills | Status: DC

## 2016-04-02 MED ORDER — FUROSEMIDE 40 MG PO TABS
40.0000 mg | ORAL_TABLET | Freq: Once | ORAL | Status: AC
  Administered 2016-04-02: 40 mg via ORAL
  Filled 2016-04-02: qty 1

## 2016-04-02 MED ORDER — ACETAMINOPHEN 500 MG PO TABS
1000.0000 mg | ORAL_TABLET | Freq: Once | ORAL | Status: AC
  Administered 2016-04-02: 1000 mg via ORAL
  Filled 2016-04-02: qty 2

## 2016-04-02 MED ORDER — LORAZEPAM 1 MG PO TABS
1.0000 mg | ORAL_TABLET | Freq: Once | ORAL | Status: AC
  Administered 2016-04-02: 1 mg via ORAL
  Filled 2016-04-02: qty 1

## 2016-04-02 NOTE — ED Provider Notes (Signed)
[place] DEPT Provider Note   CSN: [id number] Arrival date & time: 04/02/16  0001   History   Chief Complaint Chief Complaint  Patient presents with  . Shortness of Breath    HPI Brooke Stafford is a 60 y.o. female.  HPI  60 y.o. female with a hx of diastolic CHF,CAD s/p CABG,asthma/COPD, CVA in7/2017 with residual right-sided weakness, DM, andanxiety, presents to the Emergency Department today complaining of shortness of breath. Pt took taxi cab from [facility] ED to [facility] ED due to wait being too long. Recently DCed today (yesterday) after admission for acute respiratory failure with hypoxia 2/2 CHF exacerbation vs CAP. Improved with Lasix in hospital. Currently on Levaquin with two days remaining. VQ scan done in hospital with negative result. Home health was arranged for patient. Has appointment today around 1400 with transitional care clinic. Presented from EMS to [facility] initially with breathing treatment given with improvement of symptoms. Pt states she has mild headache as well as shortness of breath since discharge. No N/V. No fevers. No CP/ABD pain. No other symptoms noted.    Echo on 12-19-15 with LV EF 40-45%  [facility]  - 04/02/16 @ 1400  Past Medical History:  Diagnosis Date  . Anemia   . Anginal pain ([place])   . Anxiety   . Arthritis    "stiff fingers and knees" (08/04/2013), (12/12/2014)  . Asthma   . CAD (coronary artery disease) 2002; 2015   CABG x 6 2002, cath 2011- med Rx stent DES VG-Diag  . CAD (coronary artery disease) of artery bypass graft; DES to VG-Diag 09/28/13 11/09/2013  . Cataract   . CHF (congestive heart failure) ([place])    "in 2002" (11/26/2012)  . Chronic bronchitis ([place])    "q year; in the winter"   . CKD (chronic kidney disease)    stage 4, followed by [facility]  . Coronary artery disease 2002   CABG x 6. Cath 5/11- med Rx  . GERD (gastroesophageal reflux disease)   . Gout    "right big toe"  . Headache    "~ q  week" (08/04/2013); "~ twice/month" (12/12/2014)  . History of blood transfusion 2002   "when I had OHS"  . Hyperlipidemia   . Hypertension   . Hypothyroid    treated  . Migraines    "couple times/year" (08/04/2013), (12/12/2014)  . Myocardial infarction 2000; 2002; 2011  . Obesity (BMI 35.0-39.9 without comorbidity)   . Peripheral vascular disease ([place]) 12/12   LSFA PTA  . Pneumonia    "3 times I think" (12/12/2014)  . Type II diabetes mellitus [facility])     Patient Active Problem List   Diagnosis Date Noted  . CAP (community acquired pneumonia) 03/26/2016  . Acute respiratory failure with hypoxia ([place]) 03/26/2016  . Acute on chronic combined systolic and diastolic CHF (congestive heart failure) ([place]) 03/26/2016  . Chronic kidney disease (CKD), stage IV (severe) ([place]) 03/26/2016  . Medication management 03/21/2016  . Anemia of chronic disease 12/27/2015  . Acute on chronic systolic CHF (congestive heart failure) ([place])   . Acute CHF ([place]) 12/18/2015  . CHF exacerbation ([place]) 12/18/2015  . Constipation 12/18/2015  . Adjustment disorder with anxious mood 11/02/2015  . Posterior circulation stroke ([place]) 11/02/2015  . Abnormality of gait 10/23/2015  . Depression 09/28/2015  . Diabetes mellitus with circulatory complication, with long-term current use of insulin ([place]) 09/13/2015  . Diabetic retinopathy ([place]) 09/13/2015  . Grief reaction 08/24/2015  .  Acute kidney injury superimposed on chronic kidney disease ([place]) 07/25/2015  . Creatinine elevation 07/25/2015  . Chronic combined systolic and diastolic congestive heart failure ([place]) 07/25/2015  . Rash of hands 02/23/2015  . Essential hypertension   . Angina pectoris ([place]) 12/12/2014  . Abnormal nuclear stress test   . Acute combined systolic and diastolic heart failure ([place]) 12/02/2014  . Seasonal allergies 08/11/2014  . Gout of big toe 08/11/2014  . Encounter for screening mammogram for breast cancer 05/16/2014  . Screening for  colon cancer 05/16/2014  . Diabetic neuropathy, type II diabetes mellitus ([place]) 01/27/2014  . Atopic eczema 01/27/2014  . Precordial pain, atypical, negative MI, Musculature Skeletal pain  11/08/2013  . CKD (chronic kidney disease) stage 5, GFR less than 15 ml/min (HCC) 11/08/2013  . Unstable angina ([place]) 09/28/2013  . Ischemic cardiomyopathy- new drop in EF 08/30/2013  . Chest pain 08/04/2013  . CAD -S/P PCI June 2015 and 12/14/14 02/01/2013  . Hypothyroidism 02/01/2013  . PVD, LSFA PTA 12/12 04/06/2011  . Hx of CABG x 6 2002 04/06/2011  . Dyslipidemia 04/06/2011    Past Surgical History:  Procedure Laterality Date  . ABDOMINAL AORTAGRAM N/A 04/05/2011   Procedure: ABDOMINAL AORTAGRAM;  Surgeon: Lorretta Harp, MD;  Location: [facility] CATH LAB;  Service: Cardiovascular;  Laterality: N/A;  . APPENDECTOMY  1980  . BREAST CYST EXCISION Right 1970's  . CARDIAC CATHETERIZATION  2002  . CARDIAC CATHETERIZATION N/A 12/12/2014   Procedure: Left Heart Cath and Cors/Grafts Angiography;  Surgeon: Peter M Martinique, MD;  Location: [facility] CV LAB;  Service: Cardiovascular;  Laterality: N/A;  . CARDIAC CATHETERIZATION N/A 12/12/2014   Procedure: Coronary Stent Intervention;  Surgeon: Peter M Martinique, MD;  Location: [facility] CV LAB;  Service: Cardiovascular;  Laterality: N/A;  . [place]; 1980  . CHOLECYSTECTOMY  1982  . CORONARY ANGIOPLASTY WITH STENT PLACEMENT  2004; 2012   "I have 2 stents" (08/04/2013)  . CORONARY ANGIOPLASTY WITH STENT PLACEMENT  09/28/13   PTCA/ DES Xience stent to VG-Diag   . CORONARY ARTERY BYPASS GRAFT  11/20/2000   x6 LIMA to distal LAD, svg to first diag, svg to ramus intermediate branch and swquential SVG to cir marginal branch, SVG to posterior descending coronary and sequential SVG to first right posterolateral branch  . LEFT HEART CATHETERIZATION WITH CORONARY/GRAFT ANGIOGRAM N/A 09/28/2013   Procedure: LEFT HEART CATHETERIZATION WITH Beatrix Fetters;  Surgeon: Peter M Martinique, MD;  Location: [facility] CATH LAB;  Service: Cardiovascular;  Laterality: N/A;  . LOWER EXTREMITY ANGIOGRAM  12/01/2012   Procedure: LOWER EXTREMITY ANGIOGRAM;  Surgeon: Lorretta Harp, MD;  Location: [facility] CATH LAB;  Service: Cardiovascular;;  . NM MYOCAR PERF WALL MOTION  08/27/2004   negative  . PERCUTANEOUS STENT INTERVENTION Left 12/01/2012   Procedure: PERCUTANEOUS STENT INTERVENTION;  Surgeon: Lorretta Harp, MD;  Location: [facility] CATH LAB;  Service: Cardiovascular;  Laterality: Left;  Left SFA  . PERIPHERAL ARTERIAL STENT GRAFT Left    SFA/notes 04/07/2011 (11/30/2012)  . RENAL ANGIOGRAM N/A 04/05/2011   Procedure: RENAL ANGIOGRAM;  Surgeon: Lorretta Harp, MD;  Location: [facility] CATH LAB;  Service: Cardiovascular;  Laterality: N/A;  . TUBAL LIGATION  1980    OB History    No data available       Home Medications    Prior to Admission medications   Medication Sig Start Date End Date Taking? Authorizing Provider  Naphazoline HCl (CLEAR EYES OP) Place 1 drop  into both eyes as needed (for dry eyes).   Yes Historical Provider, MD  simethicone (MYLICON) 175 MG chewable tablet Chew 125 mg by mouth every 6 (six) hours as needed for flatulence.   Yes Historical Provider, MD  triamcinolone ointment (KENALOG) 0.5 % Apply 1 application topically 2 (two) times daily. Patient taking differently: Apply 1 application topically daily as needed (for skin).  08/24/15  Yes Tresa Garter, MD  acetaminophen-codeine (TYLENOL #3) 300-30 MG tablet Take 1 tablet by mouth every 4 (four) hours as needed for moderate pain. 01/11/16   Tresa Garter, MD  albuterol (PROVENTIL HFA;VENTOLIN HFA) 108 (90 Base) MCG/ACT inhaler Inhale 2 puffs into the lungs every 6 (six) hours as needed for wheezing or shortness of breath. 09/28/15   Tresa Garter, MD  allopurinol (ZYLOPRIM) 300 MG tablet Take 1 tablet (300 mg total) by mouth daily. Patient taking differently: Take 300 mg by  mouth every evening.  02/22/16   Tresa Garter, MD  amLODipine (NORVASC) 10 MG tablet take 1 tablet by mouth once daily Patient taking differently: Take 10 mg by mouth daily 11/20/15   Sanda Klein, MD  aspirin 81 MG chewable tablet Chew 1 tablet (81 mg total) by mouth daily. 02/24/15   Mihai Croitoru, MD  atorvastatin (LIPITOR) 40 MG tablet Take 1 tablet (40 mg total) by mouth daily. Patient taking differently: Take 40 mg by mouth daily at 6 PM.  07/28/15   Ripudeep Krystal Eaton, MD  benzonatate (TESSALON) 100 MG capsule Take 1 capsule (100 mg total) by mouth 3 (three) times daily as needed for cough. 03/21/16   Tresa Garter, MD  cloNIDine (CATAPRES) 0.2 MG tablet Take 1 tablet (0.2 mg total) by mouth 3 (three) times daily. 02/22/16   Tresa Garter, MD  clopidogrel (PLAVIX) 75 MG tablet Take 1 tablet (75 mg total) by mouth daily. 09/25/15   Mihai Croitoru, MD  ezetimibe (ZETIA) 10 MG tablet Take 1 tablet (10 mg total) by mouth daily. 01/11/16   Tresa Garter, MD  fluticasone (FLONASE) 50 MCG/ACT nasal spray Place 2 sprays into both nostrils daily as needed for allergies or rhinitis. 09/28/15   Tresa Garter, MD  furosemide (LASIX) 20 MG tablet Take a total of 60 mg in the morning and 40 mg at night. 03/21/16   Tresa Garter, MD  gabapentin (NEURONTIN) 400 MG capsule take 1 capsule by mouth three times a day Patient taking differently: take 1 capsule by mouth two times a day 01/24/16   Tresa Garter, MD  hydrOXYzine (ATARAX/VISTARIL) 25 MG tablet Take 1 tablet (25 mg total) by mouth 3 (three) times daily. 03/21/16   Tresa Garter, MD  insulin aspart (NOVOLOG FLEXPEN) 100 UNIT/ML FlexPen Inject 10-30 units into the skin 3 times daily, plus sliding scale as instructed. Patient taking differently: Inject 10-20 Units into the skin 3 (three) times daily with meals. Inject 10-30 units into the skin 3 times daily, plus sliding scale as instructed. 12/14/15   Philemon Kingdom,  MD  Insulin Glargine (TOUJEO SOLOSTAR) 300 UNIT/ML SOPN Inject 35 Units into the skin at bedtime. Patient taking differently: Inject 30 Units into the skin at bedtime.  03/15/16   Philemon Kingdom, MD  isosorbide mononitrate (IMDUR) 60 MG 24 hr tablet Take 2 tablets (120 mg total) by mouth daily. 02/22/16   Tresa Garter, MD  levofloxacin (LEVAQUIN) 500 MG tablet Take 1 tablet (500 mg total) by mouth every other day. 04/02/16  Costin Karlyne Greenspan, MD  levothyroxine (SYNTHROID, LEVOTHROID) 25 MCG tablet take 1 tablet by mouth every morning BEFORE BREAKFAST 03/26/16   Tresa Garter, MD  loratadine (CLARITIN) 10 MG tablet Take 1 tablet (10 mg total) by mouth daily as needed for allergies. Patient taking differently: Take 10 mg by mouth daily.  12/15/14   Erlene Quan, PA-C  magnesium citrate SOLN Take 296 mLs (1 Bottle total) by mouth as needed for severe constipation. 07/28/15   Ripudeep Krystal Eaton, MD  metoprolol succinate (TOPROL-XL) 50 MG 24 hr tablet Take 3 tablets (150 mg total) by mouth daily. Take with or immediately following a meal. 03/08/16   Rhonda G Barrett, PA-C  nitroGLYCERIN (NITROSTAT) 0.4 MG SL tablet Place 1 tablet (0.4 mg total) under the tongue every 5 (five) minutes as needed. For chest 11/10/14   Tresa Garter, MD  ranitidine (ZANTAC 75) 75 MG tablet Take 1 tablet (75 mg total) by mouth 2 (two) times daily. Patient taking differently: Take 75 mg by mouth 2 (two) times daily as needed for heartburn.  07/28/15   Ripudeep Krystal Eaton, MD  senna-docusate (SENOKOT-S) 8.6-50 MG tablet Take 1 tablet by mouth at bedtime as needed for mild constipation. 12/23/15   Virgel Manifold, MD  traZODone (DESYREL) 50 MG tablet Take 1 tablet (50 mg total) by mouth at bedtime as needed for sleep. Patient not taking: Reported on 04/02/2016 03/21/16   Tresa Garter, MD    Family History Family History  Problem Relation Age of Onset  . Diabetes Mother   . Hypertension Mother   . Stroke Mother   .  Hypertension Father   . Hypertension Brother   . Hypertension Sister   . Diabetes Sister   . Hyperlipidemia Sister     Social History Social History  Substance Use Topics  . Smoking status: Former Smoker    Packs/day: 0.00    Years: 25.00    Types: Cigarettes    Quit date: 04/04/2000  . Smokeless tobacco: Never Used  . Alcohol use No     Comment: 12/12/2014  "have a glass of red wine on my birthday q yr; that's it"     Allergies   Digoxin and related; Hydralazine; Lisinopril; Penicillins cross reactors; and Adhesive [tape]   Review of Systems Review of Systems ROS reviewed and all are negative for acute change except as noted in the HPI.  Physical Exam Updated Vital Signs BP 159/75 (BP Location: Right Arm)   Pulse 78   Temp 98.1 F (36.7 C) (Oral)   Resp 20   LMP 10/13/2014 (Exact Date)   SpO2 95%   Physical Exam  Constitutional: She is oriented to person, place, and time. Vital signs are normal. She appears well-developed and well-nourished.  HENT:  Head: Normocephalic and atraumatic.  Right Ear: Hearing normal.  Left Ear: Hearing normal.  Eyes: Conjunctivae and EOM are normal. Pupils are equal, round, and reactive to light.  Neck: Normal range of motion. Neck supple.  Cardiovascular: Normal rate, regular rhythm, normal heart sounds and intact distal pulses.   Pulmonary/Chest: Effort normal and breath sounds normal.  Abdominal: Soft. There is no tenderness.  Musculoskeletal: Normal range of motion.  Neurological: She is alert and oriented to person, place, and time. She has normal strength. No cranial nerve deficit or sensory deficit.  Cranial Nerves:  II: Pupils equal, round, reactive to light III,IV, VI: ptosis not present, extra-ocular motions intact bilaterally  V,VII: smile symmetric, facial light touch  sensation equal VIII: hearing grossly normal bilaterally  IX,X: midline uvula rise  XI: bilateral shoulder shrug equal and strong XII: midline tongue  extension  Skin: Skin is warm and dry.  Psychiatric: She has a normal mood and affect. Her speech is normal and behavior is normal. Thought content normal.  Nursing note and vitals reviewed.  ED Treatments / Results  Labs (all labs ordered are listed, but only abnormal results are displayed) Labs Reviewed - No data to display  EKG  EKG Interpretation  Date/Time:  Tuesday April 02 2016 05:05:00 EST Ventricular Rate:  69 PR Interval:    QRS Duration: 108 QT Interval:  452 QTC Calculation: 485 R Axis:   16 Text Interpretation:  Sinus rhythm Probable left atrial enlargement Nonspecific T abnrm, anterolateral leads Baseline wander in lead(s) V2 When compared with ECG of 04/01/2016, QT has shortened Premature ventricular complexes are no longer Present Confirmed by Roxanne Mins  MD, DAVID ([id number]) on 04/02/2016 5:14:30 AM       Radiology Dg Chest 2 View  Result Date: 04/01/2016 CLINICAL DATA:  Chest pain and dyspnea today. EXAM: CHEST  2 VIEW COMPARISON:  03/29/2016 FINDINGS: Curvilinear scarring in the bases, unchanged. The lungs are otherwise clear. Stable moderate cardiomegaly. Normal pulmonary vasculature. No pleural effusions. Hilar and mediastinal contours are unremarkable and unchanged. IMPRESSION: Stable cardiomegaly. Stable curvilinear scarring. No acute findings. Electronically Signed   By: Andreas Newport M.D.   On: 04/01/2016 22:17    Procedures Procedures (including critical care time)  Medications Ordered in ED Medications - No data to display   Initial Impression / Assessment and Plan / ED Course  I have reviewed the triage vital signs and the nursing notes.  Pertinent labs & imaging results that were available during my care of the patient were reviewed by me and considered in my medical decision making (see chart for details).  Clinical Course    Final Clinical Impressions(s) / ED Diagnoses  .{I have reviewed and evaluated the relevant laboratory values. {I have  reviewed and evaluated the relevant imaging studies. {I have interpreted the relevant EKG. {I have reviewed the relevant previous healthcare records.  {I obtained HPI from historian. {Patient discussed with supervising physician.  ED Course:  Assessment: Pt is a 16OH with hx  diastolic CHF,CAD s/p CABG,asthma/COPD, CVA in7/2017 with residual right-sided weakness, DM, andanxiety who presents with shortness of breath. Was in [facility] ED Waiting Room and left due to wait. DCed from hospital for acute respiratory failure 2/2 CHF + PNA. On Levaquin currently. Given breathing treatment via EMS with improvement when en route to [facility]. Took Cab to [facility]. On exam, pt in NAD. Nontoxic/nonseptic appearing. VSS. Afebrile. Lungs CTA. Heart RRR. Abdomen nontender soft. CBC, BMP unremarkable from [facility] lab work. BNP 1,000, but improved from DC of hospital lab work.. EKG unremarkable. CXR unremarkable at [facility]. Given lasix and tylenol in ED. O2 Sat 96% on RA. Plan is to DC home to PCP appointment at 1400 today. At time of discharge, Patient is in no acute distress. Vital Signs are stable. Patient is able to ambulate. Patient able to tolerate PO.   Disposition/Plan:  DC Home Additional Verbal discharge instructions given and discussed with patient.  Pt Instructed to f/u with PCP in the next week for evaluation and treatment of symptoms. Return precautions given Pt acknowledges and agrees with plan  Supervising Physician Delora Fuel, MD  Final diagnoses:  Acute on chronic congestive heart failure, unspecified congestive heart failure type ([place])  New Prescriptions New Prescriptions   No medications on file     Shary Decamp, PA-C [id number]    Delora Fuel, MD 88/64/84 [id number]

## 2016-04-02 NOTE — Patient Outreach (Signed)
   Unsuccessful attempt made to contact patient via telephone for transition of care assessment. Will make another attempt to contact patient later this month

## 2016-04-02 NOTE — ED Triage Notes (Addendum)
Pt c/o SOB; pt arrives to ED via cab that she took from [facility] ER; states she came to [facility] because [facility] staff 'wasn't moving fast enough'; respirations even and unlabored; NAD noted; also states she has copious amounts of drainage and mucous

## 2016-04-02 NOTE — Discharge Instructions (Signed)
Please read and follow all provided instructions.  Your diagnoses today include:  1. Acute on chronic congestive heart failure, unspecified congestive heart failure type ([place])     Tests performed today include: Vital signs. See below for your results today.   Medications prescribed:  Take as prescribed   Home care instructions:  Follow any educational materials contained in this packet.  Follow-up instructions: Please follow-up with your primary care provider for further evaluation of symptoms and treatment   Return instructions:  Please return to the Emergency Department if you do not get better, if you get worse, or new symptoms OR  - Fever (temperature greater than 101.28F)  - Bleeding that does not stop with holding pressure to the area    -Severe pain (please note that you may be more sore the day after your accident)  - Chest Pain  - Difficulty breathing  - Severe nausea or vomiting  - Inability to tolerate food and liquids  - Passing out  - Skin becoming red around your wounds  - Change in mental status (confusion or lethargy)  - New numbness or weakness    Please return if you have any other emergent concerns.  Additional Information:  Your vital signs today were: BP 155/72 (BP Location: Left Arm)    Pulse 68    Temp 98.1 F (36.7 C) (Oral)    Resp 14    LMP 10/13/2014 (Exact Date)    SpO2 95%  If your blood pressure (BP) was elevated above 135/85 this visit, please have this repeated by your doctor within one month. ---------------

## 2016-04-03 ENCOUNTER — Other Ambulatory Visit: Payer: Self-pay

## 2016-04-03 ENCOUNTER — Other Ambulatory Visit: Payer: Self-pay | Admitting: Internal Medicine

## 2016-04-03 ENCOUNTER — Telehealth: Payer: Self-pay

## 2016-04-03 DIAGNOSIS — I5042 Chronic combined systolic (congestive) and diastolic (congestive) heart failure: Secondary | ICD-10-CM | POA: Diagnosis not present

## 2016-04-03 DIAGNOSIS — J440 Chronic obstructive pulmonary disease with acute lower respiratory infection: Secondary | ICD-10-CM | POA: Diagnosis not present

## 2016-04-03 DIAGNOSIS — I130 Hypertensive heart and chronic kidney disease with heart failure and stage 1 through stage 4 chronic kidney disease, or unspecified chronic kidney disease: Secondary | ICD-10-CM | POA: Diagnosis not present

## 2016-04-03 DIAGNOSIS — E1140 Type 2 diabetes mellitus with diabetic neuropathy, unspecified: Secondary | ICD-10-CM | POA: Diagnosis not present

## 2016-04-03 DIAGNOSIS — Z951 Presence of aortocoronary bypass graft: Secondary | ICD-10-CM | POA: Diagnosis not present

## 2016-04-03 DIAGNOSIS — N184 Chronic kidney disease, stage 4 (severe): Secondary | ICD-10-CM | POA: Diagnosis not present

## 2016-04-03 DIAGNOSIS — Z7901 Long term (current) use of anticoagulants: Secondary | ICD-10-CM | POA: Diagnosis not present

## 2016-04-03 DIAGNOSIS — F329 Major depressive disorder, single episode, unspecified: Secondary | ICD-10-CM | POA: Diagnosis not present

## 2016-04-03 DIAGNOSIS — E11319 Type 2 diabetes mellitus with unspecified diabetic retinopathy without macular edema: Secondary | ICD-10-CM | POA: Diagnosis not present

## 2016-04-03 DIAGNOSIS — J189 Pneumonia, unspecified organism: Secondary | ICD-10-CM | POA: Diagnosis not present

## 2016-04-03 DIAGNOSIS — I2510 Atherosclerotic heart disease of native coronary artery without angina pectoris: Secondary | ICD-10-CM | POA: Diagnosis not present

## 2016-04-03 DIAGNOSIS — Z87891 Personal history of nicotine dependence: Secondary | ICD-10-CM | POA: Diagnosis not present

## 2016-04-03 DIAGNOSIS — Z794 Long term (current) use of insulin: Secondary | ICD-10-CM | POA: Diagnosis not present

## 2016-04-03 DIAGNOSIS — E1122 Type 2 diabetes mellitus with diabetic chronic kidney disease: Secondary | ICD-10-CM | POA: Diagnosis not present

## 2016-04-03 DIAGNOSIS — D638 Anemia in other chronic diseases classified elsewhere: Secondary | ICD-10-CM | POA: Diagnosis not present

## 2016-04-03 MED ORDER — ALBUTEROL SULFATE (2.5 MG/3ML) 0.083% IN NEBU: 2.5000 mg | INHALATION_SOLUTION | Freq: Four times a day (QID) | RESPIRATORY_TRACT | 3 refills | Status: DC | PRN

## 2016-04-03 NOTE — Telephone Encounter (Signed)
Transitional Care Clinic Post-discharge Follow-Up Phone Call:  Date of Discharge: 04/01/16 Principal Discharge Diagnosis(es): Acute respiratory failure with hypoxia, acute on chronic congestive heart failure  Call Completed: Yes                   With Whom: Patient    Please check all that apply:  X  Patient is knowledgeable of his/her condition(s) and/or treatment. X  Patient is caring for self at home.  ? Patient is receiving assist at home from family and/or caregiver. Family and/or caregiver is knowledgeable of patient's condition(s) and/or treatment. X  Patient is receiving home health services. Patient indicated she is scheduled to begin home health services with [facility] today.     Medication Reconciliation:  ? Medication list reviewed with patient. X  Patient obtained all discharge medications-YES. Patient's discharge medications reviewed, and patient has obtained all discharge medications. Attempted to review patient's entire medication list; however, patient indicated all of her other medications are the same, and she does not need to review medication list. Patient complaining of shortness of breath with activity. She indicated she was given a nebulizer at discharge but was not prescribed nebulizer solution. She has a albuterol inhaler that she uses every six hours PRN but still experiencing shortness of breath with activity. Discussed with Dr. Doreene Burke who prescribed albuterol solution for patient. He also indicated it is important for patient to be compliant with her diuretic. Reinforced with patient importance of taking lasix as prescribed (reminded patient she is to take lasix 60 mg in the morning and lasix 40 mg at night). Patient verbalized understanding and indicated she was being compliant with medication. Also informed her that albuterol nebulizer solution prescribed by Dr. Doreene Burke and sent to [facility] on [address]. Encouraged patient to weigh daily and keep a weight  log. Patient verbalized understanding.   Activities of Daily Living:  X  Independent thought given a rolling walker at discharge for ambulation. ? Needs assist  ? [facility]    Community resources in place for patient:  ? None  X  Home Health RN/PT services-[facility] to start services today per patient. Patient also followed by [facility]. ? Assisted Living ? Support Group         Questions/Concerns discussed:  Inquired if patient had found a new place to live as she informed this Case Manager while hospitalized that she hoped to move into a new apartment due to mold in her current residence. Patient indicated moving into that apartment did not work out so she is still looking for new housing. This Case Manager will Adult nurse at [employer] and [employer] to see if she can provide additional housing resources to patient.    This Case Manager reminded her of [facility] appointment on 04/11/16 at 1015. Patient aware of appointment. Informed patient to contact [facility] to arrange transportation to her appointment. Patient verbalized understanding. In addition, this Case Manager, informed patient she has a Nephology appointment on 04/04/16 at 0830 with Dr. Lorrene Reid. Patient indicated she was aware of appointment but would not be able to make it to appointment. Patient indicated she planned to call and reschedule appointment today. No additional needs identified at this time.

## 2016-04-04 ENCOUNTER — Telehealth: Payer: Self-pay | Admitting: Internal Medicine

## 2016-04-04 NOTE — Telephone Encounter (Signed)
Shanon Brow from [facility] called requesting verbal orders for social worker and nurse for evaluation. Please f/u.

## 2016-04-04 NOTE — Patient Outreach (Signed)
[facility]) Care Management  04/04/2016   Brooke Stafford 07-29-1955 [id number]  Subjective:  I had to go back to the hospital last night because I could not breath  Objective:  Patient recently discharged from the acute care setting with a diagnosis of COPD exacerbation. Current Medications:  Current Outpatient Prescriptions  Medication Sig Dispense Refill  . acetaminophen-codeine (TYLENOL #3) 300-30 MG tablet Take 1 tablet by mouth every 4 (four) hours as needed for moderate pain. 60 tablet 0  . albuterol (PROVENTIL HFA;VENTOLIN HFA) 108 (90 Base) MCG/ACT inhaler Inhale 2 puffs into the lungs every 6 (six) hours as needed for wheezing or shortness of breath. 3 Inhaler 3  . albuterol (PROVENTIL) (2.5 MG/3ML) 0.083% nebulizer solution Take 3 mLs (2.5 mg total) by nebulization every 6 (six) hours as needed for wheezing or shortness of breath. 150 mL 3  . allopurinol (ZYLOPRIM) 300 MG tablet Take 1 tablet (300 mg total) by mouth daily. (Patient taking differently: Take 300 mg by mouth every evening. ) 90 tablet 3  . amLODipine (NORVASC) 10 MG tablet take 1 tablet by mouth once daily (Patient taking differently: Take 10 mg by mouth daily) 30 tablet 11  . aspirin 81 MG chewable tablet Chew 1 tablet (81 mg total) by mouth daily. 30 tablet 10  . atorvastatin (LIPITOR) 40 MG tablet Take 1 tablet (40 mg total) by mouth daily. (Patient taking differently: Take 40 mg by mouth daily at 6 PM. ) 30 tablet 3  . B-D UF III MINI PEN NEEDLES 31G X 5 MM MISC USE TO INJECT INSULIN 4 TIMES A DAY AS INSTRUCTED 100 each 5  . benzonatate (TESSALON) 100 MG capsule Take 1 capsule (100 mg total) by mouth 3 (three) times daily as needed for cough. 30 capsule 0  . cloNIDine (CATAPRES) 0.2 MG tablet Take 1 tablet (0.2 mg total) by mouth 3 (three) times daily. 90 tablet 3  . clopidogrel (PLAVIX) 75 MG tablet Take 1 tablet (75 mg total) by mouth daily. 90 tablet 3  . ezetimibe (ZETIA) 10 MG tablet  Take 1 tablet (10 mg total) by mouth daily. 90 tablet 3  . fluticasone (FLONASE) 50 MCG/ACT nasal spray Place 2 sprays into both nostrils daily as needed for allergies or rhinitis. 16 g 3  . furosemide (LASIX) 20 MG tablet Take a total of 60 mg in the morning and 40 mg at night. (Patient taking differently: Take 80 mg by mouth daily. ) 150 tablet 3  . gabapentin (NEURONTIN) 400 MG capsule take 1 capsule by mouth three times a day (Patient taking differently: Take 400 mg by mouth twice daily) 270 capsule 0  . hydrOXYzine (ATARAX/VISTARIL) 25 MG tablet Take 1 tablet (25 mg total) by mouth 3 (three) times daily. 90 tablet 3  . insulin aspart (NOVOLOG FLEXPEN) 100 UNIT/ML FlexPen Inject 10-30 units into the skin 3 times daily, plus sliding scale as instructed. (Patient taking differently: Inject 10-20 Units into the skin 3 (three) times daily with meals. Inject 10-30 units into the skin 3 times daily, plus sliding scale as instructed.) 30 mL 5  . Insulin Glargine (TOUJEO SOLOSTAR) 300 UNIT/ML SOPN Inject 35 Units into the skin at bedtime. (Patient taking differently: Inject 30 Units into the skin at bedtime. ) 6 pen 5  . isosorbide mononitrate (IMDUR) 60 MG 24 hr tablet Take 2 tablets (120 mg total) by mouth daily. 180 tablet 3  . levofloxacin (LEVAQUIN) 500 MG tablet Take 1 tablet (500 mg  total) by mouth every other day. 2 tablet 0  . levothyroxine (SYNTHROID, LEVOTHROID) 25 MCG tablet take 1 tablet by mouth every morning BEFORE BREAKFAST (Patient taking differently: TAKE 25 MCG BY MOUTH EACH MORNING) 30 tablet 2  . loratadine (CLARITIN) 10 MG tablet Take 1 tablet (10 mg total) by mouth daily as needed for allergies. 30 tablet 11  . magnesium citrate SOLN Take 296 mLs (1 Bottle total) by mouth as needed for severe constipation. 195 mL 5  . metoprolol succinate (TOPROL-XL) 50 MG 24 hr tablet Take 3 tablets (150 mg total) by mouth daily. Take with or immediately following a meal. 90 tablet 5  . Naphazoline  HCl (CLEAR EYES OP) Place 1 drop into both eyes as needed (for dry eyes).    . naproxen (NAPROSYN) 500 MG tablet Take 1 tablet (500 mg total) by mouth 2 (two) times daily. 30 tablet 0  . nitroGLYCERIN (NITROSTAT) 0.4 MG SL tablet Place 1 tablet (0.4 mg total) under the tongue every 5 (five) minutes as needed. For chest 30 tablet 6  . prednisoLONE acetate (PRED FORTE) 1 % ophthalmic suspension Place 1 drop into both eyes 3 (three) times daily.  1  . ranitidine (ZANTAC 75) 75 MG tablet Take 1 tablet (75 mg total) by mouth 2 (two) times daily. (Patient taking differently: Take 75 mg by mouth 2 (two) times daily as needed for heartburn. ) 60 tablet 3  . senna-docusate (SENOKOT-S) 8.6-50 MG tablet Take 1 tablet by mouth at bedtime as needed for mild constipation. 30 tablet 0  . simethicone (MYLICON) 659 MG chewable tablet Chew 125 mg by mouth every 6 (six) hours as needed for flatulence.    . traZODone (DESYREL) 50 MG tablet Take 1 tablet (50 mg total) by mouth at bedtime as needed for sleep. (Patient not taking: Reported on 04/02/2016) 30 tablet 3  . triamcinolone ointment (KENALOG) 0.5 % Apply 1 application topically 2 (two) times daily. (Patient taking differently: Apply 1 application topically daily as needed (for skin). ) 30 g 2   No current facility-administered medications for this visit.     Functional Status:  In your present state of health, do you have any difficulty performing the following activities: 04/03/2016 03/26/2016  Hearing? N N  Vision? Y N  Difficulty concentrating or making decisions? N N  Walking or climbing stairs? Y N  Dressing or bathing? N N  Doing errands, shopping? N N  Preparing Food and eating ? N -  Using the Toilet? N -  In the past six months, have you accidently leaked urine? N -  Do you have problems with loss of bowel control? N -  Managing your Medications? N -  Managing your Finances? N -  Housekeeping or managing your Housekeeping? N -  Some recent  data might be hidden    Fall/Depression Screening: PHQ 2/9 Scores 04/03/2016 03/11/2016 02/23/2016 01/11/2016 01/04/2016 01/03/2016 11/30/2015  PHQ - 2 Score 0 2 6 1 3 4 4   PHQ- 9 Score - 6 27 - 12 14 12    Fall Risk  04/03/2016 02/14/2016 01/03/2016 11/30/2015 11/02/2015  Falls in the past year? Yes Yes Yes Yes No  Number falls in past yr: 2 or more 2 or more 2 or more 2 or more -  Injury with Fall? No Yes Yes - -  Risk Factor Category  High Fall Risk High Fall Risk High Fall Risk High Fall Risk -  Risk for fall due to : History of  fall(s);Impaired balance/gait;Impaired mobility;Medication side effect - History of fall(s);Impaired balance/gait;Impaired mobility;Impaired vision;Medication side effect - -  Follow up Education provided;Follow up appointment - Follow up appointment - -     Assessment:  Patient states she was currently meeting with [facility]'s Physical Therapist for intake. RNCM spoke with Physical Therapist, requested [place] SW be included in their disciplines to see patient to assist with RNCM in assisting paitent in finding safer housing.   Plan:  Home visit in the next 30 days for COPD exacerbation, referral to community resources.

## 2016-04-05 NOTE — Telephone Encounter (Signed)
MA contacted Shanon Brow in regards to VO for patient. VO provided on voicemail and [place] contact information provided for any other concerns.

## 2016-04-10 ENCOUNTER — Telehealth: Payer: Self-pay | Admitting: Licensed Clinical Social Worker

## 2016-04-10 ENCOUNTER — Telehealth: Payer: Self-pay

## 2016-04-10 ENCOUNTER — Other Ambulatory Visit: Payer: Self-pay

## 2016-04-10 DIAGNOSIS — I130 Hypertensive heart and chronic kidney disease with heart failure and stage 1 through stage 4 chronic kidney disease, or unspecified chronic kidney disease: Secondary | ICD-10-CM | POA: Diagnosis not present

## 2016-04-10 DIAGNOSIS — J440 Chronic obstructive pulmonary disease with acute lower respiratory infection: Secondary | ICD-10-CM | POA: Diagnosis not present

## 2016-04-10 DIAGNOSIS — E1122 Type 2 diabetes mellitus with diabetic chronic kidney disease: Secondary | ICD-10-CM | POA: Diagnosis not present

## 2016-04-10 DIAGNOSIS — J189 Pneumonia, unspecified organism: Secondary | ICD-10-CM | POA: Diagnosis not present

## 2016-04-10 DIAGNOSIS — F329 Major depressive disorder, single episode, unspecified: Secondary | ICD-10-CM | POA: Diagnosis not present

## 2016-04-10 DIAGNOSIS — I5042 Chronic combined systolic (congestive) and diastolic (congestive) heart failure: Secondary | ICD-10-CM | POA: Diagnosis not present

## 2016-04-10 NOTE — Telephone Encounter (Signed)
Attempted to contact the patient to check on her status and to remind her of her appointment at the [facility] tomorrow - 04/11/16 @ 1015. Call placed to # [phone] (H) and a HIPAA compliant voicemail message was left requesting a call back to # [phone] or [phone].

## 2016-04-10 NOTE — Telephone Encounter (Signed)
LCSWA attempted to contact pt to provide requested resources. Detailed message was left for a return call.

## 2016-04-10 NOTE — Telephone Encounter (Signed)
LCSWA received a return call from pt. LCSWA introduced herself and explained role at [facility].   Pt stated that she has a scheduled appointment with PCP tomorrow and agreed to meet with LCSWA during visit. No additional concerns noted.

## 2016-04-10 NOTE — Patient Outreach (Signed)
[facility]) Care Management   04/10/2016  INICE SANLUIS 04-14-1956 [id number]  JACQUELINA HEWINS is an 60 y.o. female  Subjective:  I am doing better, I am still looking for a new place to live  Objective:   ROS  Well dressed, tall slener lady who speaks in a low voice  Physical Exam ROS Encounter Medications:   Outpatient Encounter Prescriptions as of 04/10/2016  Medication Sig Note  . acetaminophen-codeine (TYLENOL #3) 300-30 MG tablet Take 1 tablet by mouth every 4 (four) hours as needed for moderate pain.   [name] albuterol (PROVENTIL HFA;VENTOLIN HFA) 108 (90 Base) MCG/ACT inhaler Inhale 2 puffs into the lungs every 6 (six) hours as needed for wheezing or shortness of breath.   [name] albuterol (PROVENTIL) (2.5 MG/3ML) 0.083% nebulizer solution Take 3 mLs (2.5 mg total) by nebulization every 6 (six) hours as needed for wheezing or shortness of breath.   . allopurinol (ZYLOPRIM) 300 MG tablet Take 1 tablet (300 mg total) by mouth daily. (Patient taking differently: Take 300 mg by mouth every evening. )   . amLODipine (NORVASC) 10 MG tablet take 1 tablet by mouth once daily (Patient taking differently: Take 10 mg by mouth daily)   . aspirin 81 MG chewable tablet Chew 1 tablet (81 mg total) by mouth daily.   [name] atorvastatin (LIPITOR) 40 MG tablet Take 1 tablet (40 mg total) by mouth daily. (Patient taking differently: Take 40 mg by mouth daily at 6 PM. )   . B-D UF III MINI PEN NEEDLES 31G X 5 MM MISC USE TO INJECT INSULIN 4 TIMES A DAY AS INSTRUCTED   . benzonatate (TESSALON) 100 MG capsule Take 1 capsule (100 mg total) by mouth 3 (three) times daily as needed for cough.   . cloNIDine (CATAPRES) 0.2 MG tablet Take 1 tablet (0.2 mg total) by mouth 3 (three) times daily.   . clopidogrel (PLAVIX) 75 MG tablet Take 1 tablet (75 mg total) by mouth daily.   [name] ezetimibe (ZETIA) 10 MG tablet Take 1 tablet (10 mg total) by mouth daily.   . fluticasone (FLONASE) 50 MCG/ACT nasal  spray Place 2 sprays into both nostrils daily as needed for allergies or rhinitis. 02/21/2016: Once a week  . furosemide (LASIX) 20 MG tablet Take a total of 60 mg in the morning and 40 mg at night. (Patient taking differently: Take 80 mg by mouth daily. )   . gabapentin (NEURONTIN) 400 MG capsule take 1 capsule by mouth three times a day (Patient taking differently: Take 400 mg by mouth twice daily)   . hydrOXYzine (ATARAX/VISTARIL) 25 MG tablet Take 1 tablet (25 mg total) by mouth 3 (three) times daily. 04/02/2016: Pt states she is taking on schedule in the place of her clonazepam bc it was too strong   . insulin aspart (NOVOLOG FLEXPEN) 100 UNIT/ML FlexPen Inject 10-30 units into the skin 3 times daily, plus sliding scale as instructed. (Patient taking differently: Inject 10-20 Units into the skin 3 (three) times daily with meals. Inject 10-30 units into the skin 3 times daily, plus sliding scale as instructed.) 02/21/2016: 10-20 units  . Insulin Glargine (TOUJEO SOLOSTAR) 300 UNIT/ML SOPN Inject 35 Units into the skin at bedtime. (Patient taking differently: Inject 30 Units into the skin at bedtime. )   . isosorbide mononitrate (IMDUR) 60 MG 24 hr tablet Take 2 tablets (120 mg total) by mouth daily.   [name] levofloxacin (LEVAQUIN) 500 MG tablet Take 1 tablet (500  mg total) by mouth every other day. 04/02/2016: Not yet started   . levothyroxine (SYNTHROID, LEVOTHROID) 25 MCG tablet take 1 tablet by mouth every morning BEFORE BREAKFAST (Patient taking differently: TAKE 25 MCG BY MOUTH EACH MORNING)   . loratadine (CLARITIN) 10 MG tablet Take 1 tablet (10 mg total) by mouth daily as needed for allergies.   . magnesium citrate SOLN Take 296 mLs (1 Bottle total) by mouth as needed for severe constipation.   . metoprolol succinate (TOPROL-XL) 50 MG 24 hr tablet Take 3 tablets (150 mg total) by mouth daily. Take with or immediately following a meal.   . Naphazoline HCl (CLEAR EYES OP) Place 1 drop into both eyes  as needed (for dry eyes).   . naproxen (NAPROSYN) 500 MG tablet Take 1 tablet (500 mg total) by mouth 2 (two) times daily.   . nitroGLYCERIN (NITROSTAT) 0.4 MG SL tablet Place 1 tablet (0.4 mg total) under the tongue every 5 (five) minutes as needed. For chest   . prednisoLONE acetate (PRED FORTE) 1 % ophthalmic suspension Place 1 drop into both eyes 3 (three) times daily.   . ranitidine (ZANTAC 75) 75 MG tablet Take 1 tablet (75 mg total) by mouth 2 (two) times daily. (Patient taking differently: Take 75 mg by mouth 2 (two) times daily as needed for heartburn. )   . senna-docusate (SENOKOT-S) 8.6-50 MG tablet Take 1 tablet by mouth at bedtime as needed for mild constipation.   . simethicone (MYLICON) 650 MG chewable tablet Chew 125 mg by mouth every 6 (six) hours as needed for flatulence.   . traZODone (DESYREL) 50 MG tablet Take 1 tablet (50 mg total) by mouth at bedtime as needed for sleep. (Patient not taking: Reported on 04/02/2016) 04/02/2016: Pt states she is not taking this medication at home  . triamcinolone ointment (KENALOG) 0.5 % Apply 1 application topically 2 (two) times daily. (Patient taking differently: Apply 1 application topically daily as needed (for skin). ) 02/21/2016: Once a month   No facility-administered encounter medications on file as of 04/10/2016.     Functional Status:   In your present state of health, do you have any difficulty performing the following activities: 04/03/2016 03/26/2016  Hearing? N N  Vision? Y N  Difficulty concentrating or making decisions? N N  Walking or climbing stairs? Y N  Dressing or bathing? N N  Doing errands, shopping? N N  Preparing Food and eating ? N -  Using the Toilet? N -  In the past six months, have you accidently leaked urine? N -  Do you have problems with loss of bowel control? N -  Managing your Medications? N -  Managing your Finances? N -  Housekeeping or managing your Housekeeping? N -  Some recent data might be  hidden    Fall/Depression Screening:    PHQ 2/9 Scores 04/03/2016 03/11/2016 02/23/2016 01/11/2016 01/04/2016 01/03/2016 11/30/2015  PHQ - 2 Score 0 '2 6 1 3 4 4  ' PHQ- 9 Score - 6 27 - '12 14 12   ' [facility] CM Care Plan Problem One   Flowsheet Row Most Recent Value  Care Plan Problem One  Patient had 2 recent hospitalizations for COPD exacerbation  Role Documenting the Problem One  Care Management [place] for Problem One  Active  [facility] Long Term Goal (31-90 days)  In the next 31 days, patient will have no acute care admissions for COPD exacerbation  [facility] Long Term Goal Start Date  04/03/16  Interventions for Problem One Long Term Goal  Initial home visit for assessment of community care coordination needs  [facility] CM Short Term Goal #1 (0-30 days)  In the next 14 days, patient will meet with [facility] RNCM for COPD education, need for community care coordination  [facility] CM Short Term Goal #1 Start Date  04/03/16    [facility] CM Care Plan Problem Two   Flowsheet Row Most Recent Value  Care Plan Problem Two  Patient lives in home with mold and mildew  Role Documenting the Problem Two  Care Management Coordinator  Care Plan for Problem Two  Active  [facility] CM Short Term Goal #1 (0-30 days)  In the next 21 days, patient will meet with [place] for review of community resources for  rental living  [facility] CM Short Term Goal #1 Start Date  04/03/16  Interventions for Short Term Goal #2   Met with patient during the intiial home visit for assessment of community care coordination needs     Assessment:   Patient reports living in home infected with mold, mildew. Patient is working with LCSW from [employer] [facility] in seeking new housing. This RNCM and patient reviewed list of low income housing apartment complexes and high lighted ones patient expressed interest.     Patient lives on a fixed income, and has section 8. Patient has live in caregiver Patient is alert and very active in her care, she  reports her last hgA1C as 5.9, down from 8 earlier this year. Patient contributes the drop in her A1C is due to lifestyle and eating changes.    Plan:  Telephonic contact within the next 30 days to assess her progress in reading her case management goals.

## 2016-04-10 NOTE — Telephone Encounter (Signed)
Message received from the patient requesting a call back. She said that she told the SW Christa See, [place]) that she will be at her appointment tomorrow.  Call placed to the patient as requested and she again confirmed her appointment for tomorrow at [facility]. Reminded her to bring all of her medications with her to the appointment.

## 2016-04-11 ENCOUNTER — Ambulatory Visit: Payer: Medicare Other | Attending: Family Medicine | Admitting: Family Medicine

## 2016-04-11 ENCOUNTER — Encounter: Payer: Self-pay | Admitting: Family Medicine

## 2016-04-11 ENCOUNTER — Encounter: Payer: Self-pay | Admitting: Licensed Clinical Social Worker

## 2016-04-11 VITALS — BP 173/78 | HR 70 | Temp 98.0°F | Ht 70.5 in | Wt 236.2 lb

## 2016-04-11 DIAGNOSIS — M10061 Idiopathic gout, right knee: Secondary | ICD-10-CM | POA: Diagnosis not present

## 2016-04-11 DIAGNOSIS — E039 Hypothyroidism, unspecified: Secondary | ICD-10-CM | POA: Diagnosis not present

## 2016-04-11 DIAGNOSIS — J449 Chronic obstructive pulmonary disease, unspecified: Secondary | ICD-10-CM | POA: Insufficient documentation

## 2016-04-11 DIAGNOSIS — J42 Unspecified chronic bronchitis: Secondary | ICD-10-CM | POA: Insufficient documentation

## 2016-04-11 DIAGNOSIS — E1122 Type 2 diabetes mellitus with diabetic chronic kidney disease: Secondary | ICD-10-CM | POA: Insufficient documentation

## 2016-04-11 DIAGNOSIS — I25810 Atherosclerosis of coronary artery bypass graft(s) without angina pectoris: Secondary | ICD-10-CM | POA: Diagnosis not present

## 2016-04-11 DIAGNOSIS — I252 Old myocardial infarction: Secondary | ICD-10-CM | POA: Diagnosis not present

## 2016-04-11 DIAGNOSIS — I5042 Chronic combined systolic (congestive) and diastolic (congestive) heart failure: Secondary | ICD-10-CM | POA: Diagnosis not present

## 2016-04-11 DIAGNOSIS — Z951 Presence of aortocoronary bypass graft: Secondary | ICD-10-CM | POA: Diagnosis not present

## 2016-04-11 DIAGNOSIS — Z7982 Long term (current) use of aspirin: Secondary | ICD-10-CM | POA: Insufficient documentation

## 2016-04-11 DIAGNOSIS — E1151 Type 2 diabetes mellitus with diabetic peripheral angiopathy without gangrene: Secondary | ICD-10-CM | POA: Diagnosis not present

## 2016-04-11 DIAGNOSIS — I2510 Atherosclerotic heart disease of native coronary artery without angina pectoris: Secondary | ICD-10-CM | POA: Diagnosis not present

## 2016-04-11 DIAGNOSIS — E669 Obesity, unspecified: Secondary | ICD-10-CM | POA: Insufficient documentation

## 2016-04-11 DIAGNOSIS — I132 Hypertensive heart and chronic kidney disease with heart failure and with stage 5 chronic kidney disease, or end stage renal disease: Secondary | ICD-10-CM | POA: Insufficient documentation

## 2016-04-11 DIAGNOSIS — F419 Anxiety disorder, unspecified: Secondary | ICD-10-CM | POA: Diagnosis not present

## 2016-04-11 DIAGNOSIS — I10 Essential (primary) hypertension: Secondary | ICD-10-CM | POA: Diagnosis not present

## 2016-04-11 DIAGNOSIS — E785 Hyperlipidemia, unspecified: Secondary | ICD-10-CM | POA: Insufficient documentation

## 2016-04-11 DIAGNOSIS — M109 Gout, unspecified: Secondary | ICD-10-CM | POA: Insufficient documentation

## 2016-04-11 DIAGNOSIS — N185 Chronic kidney disease, stage 5: Secondary | ICD-10-CM | POA: Insufficient documentation

## 2016-04-11 DIAGNOSIS — G43909 Migraine, unspecified, not intractable, without status migrainosus: Secondary | ICD-10-CM | POA: Diagnosis not present

## 2016-04-11 DIAGNOSIS — E0851 Diabetes mellitus due to underlying condition with diabetic peripheral angiopathy without gangrene: Secondary | ICD-10-CM

## 2016-04-11 DIAGNOSIS — Z6839 Body mass index (BMI) 39.0-39.9, adult: Secondary | ICD-10-CM | POA: Diagnosis not present

## 2016-04-11 DIAGNOSIS — K219 Gastro-esophageal reflux disease without esophagitis: Secondary | ICD-10-CM | POA: Insufficient documentation

## 2016-04-11 DIAGNOSIS — Z794 Long term (current) use of insulin: Secondary | ICD-10-CM

## 2016-04-11 LAB — GLUCOSE, POCT (MANUAL RESULT ENTRY): POC GLUCOSE: 201 mg/dL — AB (ref 70–99)

## 2016-04-11 MED ORDER — PREDNISONE 20 MG PO TABS: 40.0000 mg | ORAL_TABLET | Freq: Every day | ORAL | 0 refills | Status: DC

## 2016-04-11 MED ORDER — NITROGLYCERIN 0.4 MG SL SUBL: 0.4000 mg | SUBLINGUAL_TABLET | SUBLINGUAL | 2 refills | Status: AC | PRN

## 2016-04-11 NOTE — Progress Notes (Signed)
Transitional care Clinic   Date of telephone Encounter: 04/03/16  Hospitalization dates: 03/25/16 through 04/01/16 Subjective:  Patient ID: Brooke Stafford, female    DOB: Sep 01, 1955  Age: 60 y.o. MRN: [id number]  CC: Diabetes; COPD; respiratory failure; and Gout (right foot)   HPI Brooke Stafford is a 60 year old female with a history of type 2 diabetes mellitus (A1c 5.9), hypertension, CHF (2-D echo 40-45% from 2-D echo of 12/2015), stage IV to V chronic kidney disease who presents to the transitional care clinic after hospitalization for acute respiratory failure with hypoxia secondary to CHF exacerbation.  She was diuresed with IV Lasix and placed on oxygen which she was subsequently weaned off. Chest x-ray revealed pulmonary edema and possible multifocal pneumonia for which she received Levaquin, viral panel was negative. VQ scan was negative, Doppler of lower extremity was negative for DVT. 2-D echo from 12/2015 reveals EF of 73-41%, grade 1 diastolic dysfunction, mildly to moderately reduced systolic function, diffuse hypokinesis. She was discharged with home health physical therapy. She later presented to the ED with shortness of breath the next day and she was treated with Lasix and Tylenol and subsequently discharged as workup was unremarkable.  She reports doing well and dyspnea has improved compared to the time of discharge and her last ED visit. She is short of breath only on moderate exertion and feels fine when she takes her time to walk. Complains of right knee pain which is typical of her gout flare but denies swelling.  Past Medical History:  Diagnosis Date  . Anemia   . Anginal pain ([place])   . Anxiety   . Arthritis    "stiff fingers and knees" (08/04/2013), (12/12/2014)  . Asthma   . CAD (coronary artery disease) 2002; 2015   CABG x 6 2002, cath 2011- med Rx stent DES VG-Diag  . CAD (coronary artery disease) of artery bypass graft; DES to VG-Diag 09/28/13 11/09/2013    . Cataract   . CHF (congestive heart failure) ([place])    "in 2002" (11/26/2012)  . Chronic bronchitis ([place])    "q year; in the winter"   . CKD (chronic kidney disease)    stage 4, followed by [facility]  . Coronary artery disease 2002   CABG x 6. Cath 5/11- med Rx  . GERD (gastroesophageal reflux disease)   . Gout    "right big toe"  . Headache    "~ q week" (08/04/2013); "~ twice/month" (12/12/2014)  . History of blood transfusion 2002   "when I had OHS"  . Hyperlipidemia   . Hypertension   . Hypothyroid    treated  . Migraines    "couple times/year" (08/04/2013), (12/12/2014)  . Myocardial infarction 2000; 2002; 2011  . Obesity (BMI 35.0-39.9 without comorbidity)   . Peripheral vascular disease ([place]) 12/12   LSFA PTA  . Pneumonia    "3 times I think" (12/12/2014)  . Type II diabetes mellitus ([place])     Past Surgical History:  Procedure Laterality Date  . ABDOMINAL AORTAGRAM N/A 04/05/2011   Procedure: ABDOMINAL AORTAGRAM;  Surgeon: Lorretta Harp, MD;  Location: [facility] CATH LAB;  Service: Cardiovascular;  Laterality: N/A;  . APPENDECTOMY  1980  . BREAST CYST EXCISION Right 1970's  . CARDIAC CATHETERIZATION  2002  . CARDIAC CATHETERIZATION N/A 12/12/2014   Procedure: Left Heart Cath and Cors/Grafts Angiography;  Surgeon: Peter M Martinique, MD;  Location: [place] CV LAB;  Service: Cardiovascular;  Laterality: N/A;  . CARDIAC CATHETERIZATION  N/A 12/12/2014   Procedure: Coronary Stent Intervention;  Surgeon: Peter M Martinique, MD;  Location: [place] CV LAB;  Service: Cardiovascular;  Laterality: N/A;  . Cat[place]; 1980  . CHOLECYSTECTOMY  1982  . CORONARY ANGIOPLASTY WITH STENT PLACEMENT  2004; 2012   "I have 2 stents" (08/04/2013)  . CORONARY ANGIOPLASTY WITH STENT PLACEMENT  09/28/13   PTCA/ DES Xience stent to VG-Diag   . CORONARY ARTERY BYPASS GRAFT  11/20/2000   x6 LIMA to distal LAD, svg to first diag, svg to ramus intermediate branch and swquential SVG to  cir marginal branch, SVG to posterior descending coronary and sequential SVG to first right posterolateral branch  . LEFT HEART CATHETERIZATION WITH CORONARY/GRAFT ANGIOGRAM N/A 09/28/2013   Procedure: LEFT HEART CATHETERIZATION WITH Beatrix Fetters;  Surgeon: Peter M Martinique, MD;  Location: [facility] CATH LAB;  Service: Cardiovascular;  Laterality: N/A;  . LOWER EXTREMITY ANGIOGRAM  12/01/2012   Procedure: LOWER EXTREMITY ANGIOGRAM;  Surgeon: Lorretta Harp, MD;  Location: [facility] CATH LAB;  Service: Cardiovascular;;  . NM MYOCAR PERF WALL MOTION  08/27/2004   negative  . PERCUTANEOUS STENT INTERVENTION Left 12/01/2012   Procedure: PERCUTANEOUS STENT INTERVENTION;  Surgeon: Lorretta Harp, MD;  Location: [facility] CATH LAB;  Service: Cardiovascular;  Laterality: Left;  Left SFA  . PERIPHERAL ARTERIAL STENT GRAFT Left    SFA/notes 04/07/2011 (11/30/2012)  . RENAL ANGIOGRAM N/A 04/05/2011   Procedure: RENAL ANGIOGRAM;  Surgeon: Lorretta Harp, MD;  Location: [facility] CATH LAB;  Service: Cardiovascular;  Laterality: N/A;  . TUBAL LIGATION  1980    Allergies  Allergen Reactions  . Digoxin And Related Other (See Comments)    Patient stated she almost died. Had flu like symptoms as well as diarrhea.  . Hydralazine Shortness Of Breath  . Lisinopril Other (See Comments)    Felt like she had the flu. Was very sick!!!  . Penicillins Cross Reactors Hives    And high fever  . Adhesive [Tape] Rash    bruising     Outpatient Medications Prior to Visit  Medication Sig Dispense Refill  . acetaminophen-codeine (TYLENOL #3) 300-30 MG tablet Take 1 tablet by mouth every 4 (four) hours as needed for moderate pain. 60 tablet 0  . albuterol (PROVENTIL HFA;VENTOLIN HFA) 108 (90 Base) MCG/ACT inhaler Inhale 2 puffs into the lungs every 6 (six) hours as needed for wheezing or shortness of breath. 3 Inhaler 3  . albuterol (PROVENTIL) (2.5 MG/3ML) 0.083% nebulizer solution Take 3 mLs (2.5 mg total) by nebulization every 6  (six) hours as needed for wheezing or shortness of breath. 150 mL 3  . allopurinol (ZYLOPRIM) 300 MG tablet Take 1 tablet (300 mg total) by mouth daily. (Patient taking differently: Take 300 mg by mouth every evening. ) 90 tablet 3  . amLODipine (NORVASC) 10 MG tablet take 1 tablet by mouth once daily (Patient taking differently: Take 10 mg by mouth daily) 30 tablet 11  . aspirin 81 MG chewable tablet Chew 1 tablet (81 mg total) by mouth daily. 30 tablet 10  . atorvastatin (LIPITOR) 40 MG tablet Take 1 tablet (40 mg total) by mouth daily. (Patient taking differently: Take 40 mg by mouth daily at 6 PM. ) 30 tablet 3  . B-D UF III MINI PEN NEEDLES 31G X 5 MM MISC USE TO INJECT INSULIN 4 TIMES A DAY AS INSTRUCTED 100 each 5  . cloNIDine (CATAPRES) 0.2 MG tablet Take 1 tablet (0.2 mg total) by  mouth 3 (three) times daily. 90 tablet 3  . clopidogrel (PLAVIX) 75 MG tablet Take 1 tablet (75 mg total) by mouth daily. 90 tablet 3  . ezetimibe (ZETIA) 10 MG tablet Take 1 tablet (10 mg total) by mouth daily. 90 tablet 3  . fluticasone (FLONASE) 50 MCG/ACT nasal spray Place 2 sprays into both nostrils daily as needed for allergies or rhinitis. 16 g 3  . furosemide (LASIX) 20 MG tablet Take a total of 60 mg in the morning and 40 mg at night. (Patient taking differently: Take 80 mg by mouth daily. ) 150 tablet 3  . gabapentin (NEURONTIN) 400 MG capsule take 1 capsule by mouth three times a day (Patient taking differently: Take 400 mg by mouth twice daily) 270 capsule 0  . hydrOXYzine (ATARAX/VISTARIL) 25 MG tablet Take 1 tablet (25 mg total) by mouth 3 (three) times daily. 90 tablet 3  . insulin aspart (NOVOLOG FLEXPEN) 100 UNIT/ML FlexPen Inject 10-30 units into the skin 3 times daily, plus sliding scale as instructed. (Patient taking differently: Inject 10-20 Units into the skin 3 (three) times daily with meals. Inject 10-30 units into the skin 3 times daily, plus sliding scale as instructed.) 30 mL 5  . Insulin  Glargine (TOUJEO SOLOSTAR) 300 UNIT/ML SOPN Inject 35 Units into the skin at bedtime. (Patient taking differently: Inject 30 Units into the skin at bedtime. ) 6 pen 5  . isosorbide mononitrate (IMDUR) 60 MG 24 hr tablet Take 2 tablets (120 mg total) by mouth daily. 180 tablet 3  . levothyroxine (SYNTHROID, LEVOTHROID) 25 MCG tablet take 1 tablet by mouth every morning BEFORE BREAKFAST (Patient taking differently: TAKE 25 MCG BY MOUTH EACH MORNING) 30 tablet 2  . magnesium citrate SOLN Take 296 mLs (1 Bottle total) by mouth as needed for severe constipation. 195 mL 5  . metoprolol succinate (TOPROL-XL) 50 MG 24 hr tablet Take 3 tablets (150 mg total) by mouth daily. Take with or immediately following a meal. 90 tablet 5  . Naphazoline HCl (CLEAR EYES OP) Place 1 drop into both eyes as needed (for dry eyes).    . naproxen (NAPROSYN) 500 MG tablet Take 1 tablet (500 mg total) by mouth 2 (two) times daily. 30 tablet 0  . prednisoLONE acetate (PRED FORTE) 1 % ophthalmic suspension Place 1 drop into both eyes 3 (three) times daily.  1  . ranitidine (ZANTAC 75) 75 MG tablet Take 1 tablet (75 mg total) by mouth 2 (two) times daily. (Patient taking differently: Take 75 mg by mouth 2 (two) times daily as needed for heartburn. ) 60 tablet 3  . senna-docusate (SENOKOT-S) 8.6-50 MG tablet Take 1 tablet by mouth at bedtime as needed for mild constipation. 30 tablet 0  . simethicone (MYLICON) 671 MG chewable tablet Chew 125 mg by mouth every 6 (six) hours as needed for flatulence.    . triamcinolone ointment (KENALOG) 0.5 % Apply 1 application topically 2 (two) times daily. (Patient taking differently: Apply 1 application topically daily as needed (for skin). ) 30 g 2  . benzonatate (TESSALON) 100 MG capsule Take 1 capsule (100 mg total) by mouth 3 (three) times daily as needed for cough. 30 capsule 0  . nitroGLYCERIN (NITROSTAT) 0.4 MG SL tablet Place 1 tablet (0.4 mg total) under the tongue every 5 (five) minutes as  needed. For chest 30 tablet 6  . loratadine (CLARITIN) 10 MG tablet Take 1 tablet (10 mg total) by mouth daily as needed for allergies. (  Patient not taking: Reported on 04/11/2016) 30 tablet 11  . traZODone (DESYREL) 50 MG tablet Take 1 tablet (50 mg total) by mouth at bedtime as needed for sleep. (Patient not taking: Reported on 04/11/2016) 30 tablet 3  . levofloxacin (LEVAQUIN) 500 MG tablet Take 1 tablet (500 mg total) by mouth every other day. 2 tablet 0   No facility-administered medications prior to visit.     ROS Review of Systems  Constitutional: Negative for activity change, appetite change and fatigue.  HENT: Negative for congestion, sinus pressure and sore throat.   Eyes: Negative for visual disturbance.  Respiratory: Positive for shortness of breath. Negative for cough, chest tightness and wheezing. Stridor: On moderate exertion.   Cardiovascular: Negative for chest pain and palpitations.  Gastrointestinal: Negative for abdominal distention, abdominal pain and constipation.  Endocrine: Negative for polydipsia.  Genitourinary: Negative for dysuria and frequency.  Musculoskeletal:       See hpi  Skin: Negative for rash.  Neurological: Negative for tremors, light-headedness and numbness.  Hematological: Does not bruise/bleed easily.  Psychiatric/Behavioral: Negative for agitation and behavioral problems.    Objective:  BP (!) 173/78 (BP Location: Right Arm, Patient Position: Sitting, Cuff Size: Large)   Pulse 70   Temp 98 F (36.7 C) (Oral)   Ht 5' 10.5" (1.791 m)   Wt 236 lb 3.2 oz (107.1 kg)   LMP 10/13/2014 (Exact Date)   SpO2 98%   BMI 33.41 kg/m   BP/Weight 04/11/2016 04/10/2016 62/83/1517  Systolic BP 616 - 073  Diastolic BP 78 - 72  Wt. (Lbs) 236.2 238 -  BMI 33.41 34.15 -      Physical Exam  Constitutional: She is oriented to person, place, and time. She appears well-developed and well-nourished.  Cardiovascular: Normal rate, normal heart sounds and  intact distal pulses.   No murmur heard. Pulmonary/Chest: Effort normal and breath sounds normal. She has no wheezes. She has no rales. She exhibits no tenderness.  Abdominal: Soft. Bowel sounds are normal. She exhibits no distension and no mass. There is no tenderness.  Musculoskeletal: She exhibits tenderness (mild tenderness on range of motion of right knee). She exhibits no edema.  Neurological: She is alert and oriented to person, place, and time.  Skin: Skin is warm and dry.  Psychiatric: She has a normal mood and affect.    Lab Results  Component Value Date   HGBA1C 5.9 03/15/2016    Assessment & Plan:   1. Diabetes mellitus due to underlying condition with diabetic peripheral angiopathy without gangrene, with long-term current use of insulin (HCC) Uncontrolled with A1c of 5.9 Continue Toujeo and NovoLog Keep appointment with endocrinology  - Glucose (CBG)  2. CKD (chronic kidney disease) stage 5, GFR less than 15 ml/min (HCC) Avoid nephrotoxic agents Keep appointment with nephrology  3. Acute idiopathic gout of right knee Avoid foods that trigger gout - predniSONE (DELTASONE) 20 MG tablet; Take 2 tablets (40 mg total) by mouth daily with breakfast.  Dispense: 10 tablet; Refill: 0  4. Essential hypertension Uncontrolled Patient advised to take her amlodipine in the morning along with other medications Reassess blood pressure at next visit Low-sodium diet - nitroGLYCERIN (NITROSTAT) 0.4 MG SL tablet; Place 1 tablet (0.4 mg total) under the tongue every 5 (five) minutes as needed.  Dispense: 30 tablet; Refill: 2  5. Chronic combined systolic and diastolic congestive heart failure (HCC) EF of 40-45% Euvolemic Will need to exclude underlying pulmonary process - Pulmonary function test; Future  6.  Chronic bronchitis, unspecified chronic bronchitis type ([place]) Chart reveals a questionable history of asthma versus bronchitis Will evaluate for COPD - Pulmonary function  test; Future   Meds ordered this encounter  Medications  . nitroGLYCERIN (NITROSTAT) 0.4 MG SL tablet    Sig: Place 1 tablet (0.4 mg total) under the tongue every 5 (five) minutes as needed.    Dispense:  30 tablet    Refill:  2  . predniSONE (DELTASONE) 20 MG tablet    Sig: Take 2 tablets (40 mg total) by mouth daily with breakfast.    Dispense:  10 tablet    Refill:  0    Follow-up: Return in about 2 weeks (around 04/25/2016) for TCC - follow-up on hypertension and pulmonary function test.   Arnoldo Morale MD

## 2016-04-11 NOTE — BH Specialist Note (Signed)
Session Start time: 11:00 am   End Time: 11:30 am Total Time:  30 minutes Type of Service: [place] Interpreter: No.   Interpreter Name & Language: N/A # [facility] Visits July 2017-June 2018: 1st   SUBJECTIVE: Brooke Stafford is a 60 y.o. female  Pt. was referred by Dr. Jarold Song for:  anxiety, depression and housing resources. Pt. reports the following symptoms/concerns: overwhelming feelings of sadness, worry, difficulty sleeping, and difficulty concentrating Duration of problem:  Eight months Severity: severe  Previous treatment: Pt is currently receiving therapy and taking prescribed medication for mental health   OBJECTIVE: Mood: Pleasant & Affect: Depressed Risk of harm to self or others: Pt denied SI/HI Assessments administered: PHQ-9; GAD-7  LIFE CONTEXT:  Family & Social: Pt resides with a friend that provides emotional and financial support. She has two best friends who reside nearby and is involved in church  School/ Work: Pt receives disability ($960) and food stamps ($85). She has active Medicaid and Medicare and has applied for housing assistance Self-Care: Pt denied substance use Life changes: Pt is currently grieving the loss of her adult son who passed in April 2017 What is important to pt/family (values): Family, Spirituality, Good Health   GOALS ADDRESSED:  Decrease symptoms of anxiety Decrease symptoms of depresssion  INTERVENTIONS: Solution Focused, Strength-based and Supportive   ASSESSMENT:  Pt currently experiencing depression and anxiety triggered by the loss of pt's adult son. Pt reports overwhelming feelings of sadness, worry, difficulty sleeping, and difficulty concentrating. Pt receives support from friends and the church. Pt may benefit from psychotherapy and medication management. She receives grief therapy through [facility] and complies with prescribed medication for mental health. Pt reported unsatisfactory conditions with current  home and verbalized interest in obtaining new housing. [facility] provided pt with housing resources and agencies that can assist with housing searches. Pt receives care management through [employer] and has received additional housing resources from care Nurse, children's. Pt will continue to implement healthy coping skills to assist in decrease of symptoms.      PLAN: 1. F/U with behavioral health clinician: Pt was encouraged to contact [facility] if symptoms worsen or fail to improve to schedule behavioral appointments at [facility]. 2. Behavioral Health meds: Hydroxyzine 3. Behavioral recommendations: LCSWA recommends that pt apply healthy coping skills discussed and utilize community resources to obtain appropriate housing. Pt is encouraged to schedule follow up appointment with [facility] 4. Referral: Brief Counseling/Psychotherapy, [employer], Problem-solving teaching/coping strategies, Psychoeducation and Supportive Counseling 5. From scale of 1-10, how likely are you to follow plan: 9/10   Rebekah [place], MSW, [place] Worker 04/11/16 12:06 PM  Warmhandoff:   Warm Hand Off Completed.

## 2016-04-11 NOTE — Progress Notes (Signed)
Refill on nitro

## 2016-04-12 DIAGNOSIS — E1122 Type 2 diabetes mellitus with diabetic chronic kidney disease: Secondary | ICD-10-CM | POA: Diagnosis not present

## 2016-04-12 DIAGNOSIS — I5042 Chronic combined systolic (congestive) and diastolic (congestive) heart failure: Secondary | ICD-10-CM | POA: Diagnosis not present

## 2016-04-12 DIAGNOSIS — I130 Hypertensive heart and chronic kidney disease with heart failure and stage 1 through stage 4 chronic kidney disease, or unspecified chronic kidney disease: Secondary | ICD-10-CM | POA: Diagnosis not present

## 2016-04-12 DIAGNOSIS — F329 Major depressive disorder, single episode, unspecified: Secondary | ICD-10-CM | POA: Diagnosis not present

## 2016-04-12 DIAGNOSIS — J189 Pneumonia, unspecified organism: Secondary | ICD-10-CM | POA: Diagnosis not present

## 2016-04-12 DIAGNOSIS — J440 Chronic obstructive pulmonary disease with acute lower respiratory infection: Secondary | ICD-10-CM | POA: Diagnosis not present

## 2016-04-17 DIAGNOSIS — J189 Pneumonia, unspecified organism: Secondary | ICD-10-CM | POA: Diagnosis not present

## 2016-04-17 DIAGNOSIS — E1122 Type 2 diabetes mellitus with diabetic chronic kidney disease: Secondary | ICD-10-CM | POA: Diagnosis not present

## 2016-04-17 DIAGNOSIS — I130 Hypertensive heart and chronic kidney disease with heart failure and stage 1 through stage 4 chronic kidney disease, or unspecified chronic kidney disease: Secondary | ICD-10-CM | POA: Diagnosis not present

## 2016-04-17 DIAGNOSIS — J440 Chronic obstructive pulmonary disease with acute lower respiratory infection: Secondary | ICD-10-CM | POA: Diagnosis not present

## 2016-04-17 DIAGNOSIS — I5042 Chronic combined systolic (congestive) and diastolic (congestive) heart failure: Secondary | ICD-10-CM | POA: Diagnosis not present

## 2016-04-17 DIAGNOSIS — F329 Major depressive disorder, single episode, unspecified: Secondary | ICD-10-CM | POA: Diagnosis not present

## 2016-04-18 ENCOUNTER — Encounter (HOSPITAL_COMMUNITY)
Admission: RE | Admit: 2016-04-18 | Discharge: 2016-04-18 | Disposition: A | Discharge status: Home / Self Care | Payer: Medicare Other | Source: Ambulatory Visit | Attending: Nephrology | Admitting: Nephrology

## 2016-04-18 DIAGNOSIS — Z5181 Encounter for therapeutic drug level monitoring: Secondary | ICD-10-CM | POA: Diagnosis not present

## 2016-04-18 DIAGNOSIS — D631 Anemia in chronic kidney disease: Secondary | ICD-10-CM | POA: Insufficient documentation

## 2016-04-18 DIAGNOSIS — N184 Chronic kidney disease, stage 4 (severe): Secondary | ICD-10-CM | POA: Diagnosis not present

## 2016-04-18 DIAGNOSIS — N185 Chronic kidney disease, stage 5: Secondary | ICD-10-CM

## 2016-04-18 DIAGNOSIS — Z79899 Other long term (current) drug therapy: Secondary | ICD-10-CM | POA: Insufficient documentation

## 2016-04-18 LAB — IRON AND TIBC
Iron: 108 ug/dL (ref 28–170)
SATURATION RATIOS: 39 % — AB (ref 10.4–31.8)
TIBC: 276 ug/dL (ref 250–450)
UIBC: 168 ug/dL

## 2016-04-18 LAB — POCT HEMOGLOBIN-HEMACUE: HEMOGLOBIN: 10 g/dL — AB (ref 12.0–15.0)

## 2016-04-18 LAB — FERRITIN: Ferritin: 95 ng/mL (ref 11–307)

## 2016-04-18 MED ORDER — DARBEPOETIN ALFA 100 MCG/0.5ML IJ SOSY
PREFILLED_SYRINGE | INTRAMUSCULAR | Status: AC
  Administered 2016-04-18: 13:00:00 100 ug via SUBCUTANEOUS
  Filled 2016-04-18: qty 0.5

## 2016-04-18 MED ORDER — DARBEPOETIN ALFA 100 MCG/0.5ML IJ SOSY
100.0000 ug | PREFILLED_SYRINGE | INTRAMUSCULAR | Status: DC
  Administered 2016-04-18: 100 ug via SUBCUTANEOUS

## 2016-04-19 ENCOUNTER — Telehealth: Payer: Self-pay

## 2016-04-19 ENCOUNTER — Other Ambulatory Visit: Payer: Self-pay

## 2016-04-19 DIAGNOSIS — N185 Chronic kidney disease, stage 5: Secondary | ICD-10-CM | POA: Diagnosis not present

## 2016-04-19 DIAGNOSIS — N2581 Secondary hyperparathyroidism of renal origin: Secondary | ICD-10-CM | POA: Diagnosis not present

## 2016-04-19 DIAGNOSIS — E1129 Type 2 diabetes mellitus with other diabetic kidney complication: Secondary | ICD-10-CM | POA: Diagnosis not present

## 2016-04-19 DIAGNOSIS — I120 Hypertensive chronic kidney disease with stage 5 chronic kidney disease or end stage renal disease: Secondary | ICD-10-CM | POA: Diagnosis not present

## 2016-04-19 DIAGNOSIS — Z6834 Body mass index (BMI) 34.0-34.9, adult: Secondary | ICD-10-CM | POA: Diagnosis not present

## 2016-04-19 DIAGNOSIS — J9601 Acute respiratory failure with hypoxia: Secondary | ICD-10-CM | POA: Diagnosis not present

## 2016-04-19 DIAGNOSIS — D631 Anemia in chronic kidney disease: Secondary | ICD-10-CM | POA: Diagnosis not present

## 2016-04-19 DIAGNOSIS — I5033 Acute on chronic diastolic (congestive) heart failure: Secondary | ICD-10-CM | POA: Diagnosis not present

## 2016-04-19 NOTE — Telephone Encounter (Signed)
This Case Manager placed call to check patient's status. Patient indicated she was doing well and denied shortness of breath. Patient indicated she was adhering to a low sodium diet and taking her medications as prescribed. Patient also indicated she is staying away from foods that trigger gout and is able to verbalize the foods she should avoid. Patient aware she has an appointment on 04/23/15 at 1100 for PFTs and requests her appointment with Dr. Jarold Song be changed to an appointment in the afternoon because she has several appointments scheduled next week. Appointment changed to 04/26/16 at 1400. Patient appreciative and indicated she will have transportation to her appointment. No additional needs identified at this time.

## 2016-04-21 NOTE — Patient Outreach (Signed)
[facility]) Care Management  04/19/2016   Brooke Stafford 07-23-1955 [id number]  Subjective:  I am still looking for somewhere to stay  Objective:  Telephonic encounter.  Current Medications:  Current Outpatient Prescriptions  Medication Sig Dispense Refill  . acetaminophen-codeine (TYLENOL #3) 300-30 MG tablet Take 1 tablet by mouth every 4 (four) hours as needed for moderate pain. 60 tablet 0  . albuterol (PROVENTIL HFA;VENTOLIN HFA) 108 (90 Base) MCG/ACT inhaler Inhale 2 puffs into the lungs every 6 (six) hours as needed for wheezing or shortness of breath. 3 Inhaler 3  . albuterol (PROVENTIL) (2.5 MG/3ML) 0.083% nebulizer solution Take 3 mLs (2.5 mg total) by nebulization every 6 (six) hours as needed for wheezing or shortness of breath. 150 mL 3  . allopurinol (ZYLOPRIM) 300 MG tablet Take 1 tablet (300 mg total) by mouth daily. (Patient taking differently: Take 300 mg by mouth every evening. ) 90 tablet 3  . amLODipine (NORVASC) 10 MG tablet take 1 tablet by mouth once daily (Patient taking differently: Take 10 mg by mouth daily) 30 tablet 11  . aspirin 81 MG chewable tablet Chew 1 tablet (81 mg total) by mouth daily. 30 tablet 10  . atorvastatin (LIPITOR) 40 MG tablet Take 1 tablet (40 mg total) by mouth daily. (Patient taking differently: Take 40 mg by mouth daily at 6 PM. ) 30 tablet 3  . B-D UF III MINI PEN NEEDLES 31G X 5 MM MISC USE TO INJECT INSULIN 4 TIMES A DAY AS INSTRUCTED 100 each 5  . cloNIDine (CATAPRES) 0.2 MG tablet Take 1 tablet (0.2 mg total) by mouth 3 (three) times daily. 90 tablet 3  . clopidogrel (PLAVIX) 75 MG tablet Take 1 tablet (75 mg total) by mouth daily. 90 tablet 3  . ezetimibe (ZETIA) 10 MG tablet Take 1 tablet (10 mg total) by mouth daily. 90 tablet 3  . fluticasone (FLONASE) 50 MCG/ACT nasal spray Place 2 sprays into both nostrils daily as needed for allergies or rhinitis. 16 g 3  . furosemide (LASIX) 20 MG tablet Take a total of  60 mg in the morning and 40 mg at night. (Patient taking differently: Take 80 mg by mouth daily. ) 150 tablet 3  . gabapentin (NEURONTIN) 400 MG capsule take 1 capsule by mouth three times a day (Patient taking differently: Take 400 mg by mouth twice daily) 270 capsule 0  . hydrOXYzine (ATARAX/VISTARIL) 25 MG tablet Take 1 tablet (25 mg total) by mouth 3 (three) times daily. 90 tablet 3  . insulin aspart (NOVOLOG FLEXPEN) 100 UNIT/ML FlexPen Inject 10-30 units into the skin 3 times daily, plus sliding scale as instructed. (Patient taking differently: Inject 10-20 Units into the skin 3 (three) times daily with meals. Inject 10-30 units into the skin 3 times daily, plus sliding scale as instructed.) 30 mL 5  . Insulin Glargine (TOUJEO SOLOSTAR) 300 UNIT/ML SOPN Inject 35 Units into the skin at bedtime. (Patient taking differently: Inject 30 Units into the skin at bedtime. ) 6 pen 5  . isosorbide mononitrate (IMDUR) 60 MG 24 hr tablet Take 2 tablets (120 mg total) by mouth daily. 180 tablet 3  . levothyroxine (SYNTHROID, LEVOTHROID) 25 MCG tablet take 1 tablet by mouth every morning BEFORE BREAKFAST (Patient taking differently: TAKE 25 MCG BY MOUTH EACH MORNING) 30 tablet 2  . loratadine (CLARITIN) 10 MG tablet Take 1 tablet (10 mg total) by mouth daily as needed for allergies. (Patient not taking: Reported on 04/11/2016)  30 tablet 11  . magnesium citrate SOLN Take 296 mLs (1 Bottle total) by mouth as needed for severe constipation. 195 mL 5  . metoprolol succinate (TOPROL-XL) 50 MG 24 hr tablet Take 3 tablets (150 mg total) by mouth daily. Take with or immediately following a meal. 90 tablet 5  . Naphazoline HCl (CLEAR EYES OP) Place 1 drop into both eyes as needed (for dry eyes).    . naproxen (NAPROSYN) 500 MG tablet Take 1 tablet (500 mg total) by mouth 2 (two) times daily. 30 tablet 0  . nitroGLYCERIN (NITROSTAT) 0.4 MG SL tablet Place 1 tablet (0.4 mg total) under the tongue every 5 (five) minutes as  needed. 30 tablet 2  . prednisoLONE acetate (PRED FORTE) 1 % ophthalmic suspension Place 1 drop into both eyes 3 (three) times daily.  1  . predniSONE (DELTASONE) 20 MG tablet Take 2 tablets (40 mg total) by mouth daily with breakfast. 10 tablet 0  . ranitidine (ZANTAC 75) 75 MG tablet Take 1 tablet (75 mg total) by mouth 2 (two) times daily. (Patient taking differently: Take 75 mg by mouth 2 (two) times daily as needed for heartburn. ) 60 tablet 3  . senna-docusate (SENOKOT-S) 8.6-50 MG tablet Take 1 tablet by mouth at bedtime as needed for mild constipation. 30 tablet 0  . simethicone (MYLICON) 071 MG chewable tablet Chew 125 mg by mouth every 6 (six) hours as needed for flatulence.    . traZODone (DESYREL) 50 MG tablet Take 1 tablet (50 mg total) by mouth at bedtime as needed for sleep. (Patient not taking: Reported on 04/11/2016) 30 tablet 3  . triamcinolone ointment (KENALOG) 0.5 % Apply 1 application topically 2 (two) times daily. (Patient taking differently: Apply 1 application topically daily as needed (for skin). ) 30 g 2   No current facility-administered medications for this visit.     Functional Status:  In your present state of health, do you have any difficulty performing the following activities: 04/03/2016 03/26/2016  Hearing? N N  Vision? Y N  Difficulty concentrating or making decisions? N N  Walking or climbing stairs? Y N  Dressing or bathing? N N  Doing errands, shopping? N N  Preparing Food and eating ? N -  Using the Toilet? N -  In the past six months, have you accidently leaked urine? N -  Do you have problems with loss of bowel control? N -  Managing your Medications? N -  Managing your Finances? N -  Housekeeping or managing your Housekeeping? N -  Some recent data might be hidden    Fall/Depression Screening: PHQ 2/9 Scores 04/03/2016 03/11/2016 02/23/2016 01/11/2016 01/04/2016 01/03/2016 11/30/2015  PHQ - 2 Score 0 _0 PHQ- 9 Score - 6 27 - _1 [facility] CM Care Plan Problem One   Flowsheet Row Most Recent Value  Care Plan Problem One  (P) Patient had 2 recent hospitalizations for COPD exacerbation  Role Documenting the Problem One  (P) Care Management [place] for Problem One  (P) Active  [facility] Long Term Goal (31-90 days)  (P) In the next 31 days, patient will have no acute care admissions for COPD exacerbation  [facility] Long Term Goal Start Date  (P) 04/03/16  Interventions for Problem One Long Term Goal  (P) Initial home visit for assessment of community care coordination needs  [facility] CM Short Term Goal #1 (0-30 days)  (P) In  the next 14 days, patient will meet with [facility] RNCM for COPD education, need for community care coordination  [facility] CM Short Term Goal #1 Start Date  (P) 04/03/16    [facility] CM Care Plan Problem Two   Flowsheet Row Most Recent Value  Care Plan Problem Two  (P) Patient lives in home with mold and mildew  Role Documenting the Problem Two  (P) Care Management [place] for Problem Two  (P) Active  [facility] CM Short Term Goal #1 (0-30 days)  (P) In the next 21 days, patient will meet with [facility] RNCM for review of community resources for  rental living  [facility] CM Short Term Goal #1 Start Date  (P) 04/03/16  Interventions for Short Term Goal #2   (P) Met with patient during the intiial home visit for assessment of community care coordination needs     Assessment:  Patient acute care or ED visits during this assessment periods. Patient reports continuing to look for affordable housing from list of low  income housing in the [employer] provided by [facility] LCSW.  Plan:  Telephone contact later this month for further need of community care coordination.

## 2016-04-22 ENCOUNTER — Ambulatory Visit (HOSPITAL_COMMUNITY)
Admission: RE | Admit: 2016-04-22 | Discharge: 2016-04-22 | Disposition: A | Discharge status: Home / Self Care | Payer: Medicare Other | Source: Ambulatory Visit | Attending: Family Medicine | Admitting: Family Medicine

## 2016-04-22 ENCOUNTER — Encounter: Payer: Self-pay | Admitting: Internal Medicine

## 2016-04-22 DIAGNOSIS — J42 Unspecified chronic bronchitis: Secondary | ICD-10-CM | POA: Diagnosis not present

## 2016-04-22 DIAGNOSIS — I5042 Chronic combined systolic (congestive) and diastolic (congestive) heart failure: Secondary | ICD-10-CM | POA: Diagnosis not present

## 2016-04-22 LAB — PULMONARY FUNCTION TEST
DL/VA % pred: 62 %
DL/VA: 3.44 ml/min/mmHg/L
DLCO UNC % PRED: 35 %
DLCO UNC: 11.56 ml/min/mmHg
FEF 25-75 POST: 2.73 L/s
FEF 25-75 PRE: 1.39 L/s
FEF2575-%Change-Post: 97 %
FEF2575-%PRED-POST: 109 %
FEF2575-%PRED-PRE: 55 %
FEV1-%Change-Post: 29 %
FEV1-%PRED-POST: 65 %
FEV1-%Pred-Pre: 50 %
FEV1-POST: 1.74 L
FEV1-Pre: 1.35 L
FEV1FVC-%Change-Post: 0 %
FEV1FVC-%PRED-PRE: 98 %
FEV6-%CHANGE-POST: 28 %
FEV6-%PRED-POST: 65 %
FEV6-%Pred-Pre: 50 %
FEV6-Post: 2.15 L
FEV6-Pre: 1.67 L
FEV6FVC-%CHANGE-POST: 0 %
FEV6FVC-%PRED-PRE: 101 %
FEV6FVC-%Pred-Post: 101 %
FVC-%Change-Post: 28 %
FVC-%PRED-POST: 65 %
FVC-%Pred-Pre: 51 %
FVC-Post: 2.23 L
FVC-Pre: 1.73 L
PRE FEV1/FVC RATIO: 78 %
Post FEV1/FVC ratio: 78 %
Post FEV6/FVC ratio: 98 %
Pre FEV6/FVC Ratio: 98 %
RV % pred: 77 %
RV: 1.82 L
TLC % pred: 65 %
TLC: 3.97 L

## 2016-04-22 MED ORDER — ALBUTEROL SULFATE (2.5 MG/3ML) 0.083% IN NEBU
2.5000 mg | INHALATION_SOLUTION | Freq: Once | RESPIRATORY_TRACT | Status: AC
  Administered 2016-04-22: 2.5 mg via RESPIRATORY_TRACT

## 2016-04-23 ENCOUNTER — Telehealth: Payer: Self-pay | Admitting: Internal Medicine

## 2016-04-23 ENCOUNTER — Telehealth: Payer: Self-pay

## 2016-04-23 DIAGNOSIS — E113411 Type 2 diabetes mellitus with severe nonproliferative diabetic retinopathy with macular edema, right eye: Secondary | ICD-10-CM | POA: Diagnosis not present

## 2016-04-23 DIAGNOSIS — J189 Pneumonia, unspecified organism: Secondary | ICD-10-CM | POA: Diagnosis not present

## 2016-04-23 DIAGNOSIS — E1122 Type 2 diabetes mellitus with diabetic chronic kidney disease: Secondary | ICD-10-CM | POA: Diagnosis not present

## 2016-04-23 DIAGNOSIS — I130 Hypertensive heart and chronic kidney disease with heart failure and stage 1 through stage 4 chronic kidney disease, or unspecified chronic kidney disease: Secondary | ICD-10-CM | POA: Diagnosis not present

## 2016-04-23 DIAGNOSIS — I5042 Chronic combined systolic (congestive) and diastolic (congestive) heart failure: Secondary | ICD-10-CM | POA: Diagnosis not present

## 2016-04-23 DIAGNOSIS — F329 Major depressive disorder, single episode, unspecified: Secondary | ICD-10-CM | POA: Diagnosis not present

## 2016-04-23 DIAGNOSIS — E113412 Type 2 diabetes mellitus with severe nonproliferative diabetic retinopathy with macular edema, left eye: Secondary | ICD-10-CM | POA: Diagnosis not present

## 2016-04-23 DIAGNOSIS — J440 Chronic obstructive pulmonary disease with acute lower respiratory infection: Secondary | ICD-10-CM | POA: Diagnosis not present

## 2016-04-23 DIAGNOSIS — Z961 Presence of intraocular lens: Secondary | ICD-10-CM | POA: Diagnosis not present

## 2016-04-23 DIAGNOSIS — H3561 Retinal hemorrhage, right eye: Secondary | ICD-10-CM | POA: Diagnosis not present

## 2016-04-23 NOTE — Telephone Encounter (Signed)
Patient returning call to get results. Please f/u.

## 2016-04-23 NOTE — Telephone Encounter (Signed)
-----   Message from Arnoldo Morale, MD sent at 04/22/2016 12:11 PM EST ----- Pulmonary function test does reveal severe obstructive airway disease and some restrictive disease. Continue current medications.

## 2016-04-23 NOTE — Telephone Encounter (Signed)
Writer called per Dr. Jarold Song and LVM requesting patient to call back to discuss PFT results.

## 2016-04-24 DIAGNOSIS — J189 Pneumonia, unspecified organism: Secondary | ICD-10-CM | POA: Diagnosis not present

## 2016-04-24 DIAGNOSIS — I130 Hypertensive heart and chronic kidney disease with heart failure and stage 1 through stage 4 chronic kidney disease, or unspecified chronic kidney disease: Secondary | ICD-10-CM | POA: Diagnosis not present

## 2016-04-24 DIAGNOSIS — E1122 Type 2 diabetes mellitus with diabetic chronic kidney disease: Secondary | ICD-10-CM | POA: Diagnosis not present

## 2016-04-24 DIAGNOSIS — F329 Major depressive disorder, single episode, unspecified: Secondary | ICD-10-CM | POA: Diagnosis not present

## 2016-04-24 DIAGNOSIS — I5042 Chronic combined systolic (congestive) and diastolic (congestive) heart failure: Secondary | ICD-10-CM | POA: Diagnosis not present

## 2016-04-24 DIAGNOSIS — J440 Chronic obstructive pulmonary disease with acute lower respiratory infection: Secondary | ICD-10-CM | POA: Diagnosis not present

## 2016-04-24 NOTE — Telephone Encounter (Signed)
Patient returning call from [place]. Please f/u.

## 2016-04-25 ENCOUNTER — Ambulatory Visit: Payer: Medicare Other | Admitting: Family Medicine

## 2016-04-25 ENCOUNTER — Telehealth: Payer: Self-pay

## 2016-04-25 DIAGNOSIS — F329 Major depressive disorder, single episode, unspecified: Secondary | ICD-10-CM | POA: Diagnosis not present

## 2016-04-25 DIAGNOSIS — J189 Pneumonia, unspecified organism: Secondary | ICD-10-CM | POA: Diagnosis not present

## 2016-04-25 DIAGNOSIS — J440 Chronic obstructive pulmonary disease with acute lower respiratory infection: Secondary | ICD-10-CM | POA: Diagnosis not present

## 2016-04-25 DIAGNOSIS — I130 Hypertensive heart and chronic kidney disease with heart failure and stage 1 through stage 4 chronic kidney disease, or unspecified chronic kidney disease: Secondary | ICD-10-CM | POA: Diagnosis not present

## 2016-04-25 DIAGNOSIS — I5042 Chronic combined systolic (congestive) and diastolic (congestive) heart failure: Secondary | ICD-10-CM | POA: Diagnosis not present

## 2016-04-25 DIAGNOSIS — E1122 Type 2 diabetes mellitus with diabetic chronic kidney disease: Secondary | ICD-10-CM | POA: Diagnosis not present

## 2016-04-25 NOTE — Telephone Encounter (Signed)
-----   Message from Arnoldo Morale, MD sent at 04/22/2016 12:11 PM EST ----- Pulmonary function test does reveal severe obstructive airway disease and some restrictive disease. Continue current medications.

## 2016-04-25 NOTE — Telephone Encounter (Signed)
This Case Manager placed call to patient to remind her of upcoming Transitional Care follow-up appointment on 04/26/16 at 1400 with Dr. Jarold Song. Patient indicated she would be at her appointment and confirmed she would have transportation. Reminded patient to bring all medications with her to her appointment. Patient verbalized understanding. No additional needs identified.

## 2016-04-25 NOTE — Telephone Encounter (Signed)
Writer called patient again to discuss PFT.  LVM requesting a call back.

## 2016-04-26 ENCOUNTER — Telehealth: Payer: Self-pay

## 2016-04-26 ENCOUNTER — Other Ambulatory Visit: Payer: Self-pay

## 2016-04-26 ENCOUNTER — Ambulatory Visit: Payer: Medicare Other | Attending: Family Medicine | Admitting: Family Medicine

## 2016-04-26 ENCOUNTER — Encounter: Payer: Self-pay | Admitting: Family Medicine

## 2016-04-26 ENCOUNTER — Ambulatory Visit: Payer: Medicare Other | Admitting: Family Medicine

## 2016-04-26 ENCOUNTER — Encounter: Payer: Self-pay | Admitting: Licensed Clinical Social Worker

## 2016-04-26 VITALS — BP 160/81 | HR 71 | Temp 97.3°F | Ht 70.5 in | Wt 230.6 lb

## 2016-04-26 DIAGNOSIS — J42 Unspecified chronic bronchitis: Secondary | ICD-10-CM

## 2016-04-26 DIAGNOSIS — E785 Hyperlipidemia, unspecified: Secondary | ICD-10-CM | POA: Insufficient documentation

## 2016-04-26 DIAGNOSIS — M109 Gout, unspecified: Secondary | ICD-10-CM | POA: Diagnosis not present

## 2016-04-26 DIAGNOSIS — F419 Anxiety disorder, unspecified: Secondary | ICD-10-CM | POA: Insufficient documentation

## 2016-04-26 DIAGNOSIS — E1122 Type 2 diabetes mellitus with diabetic chronic kidney disease: Secondary | ICD-10-CM | POA: Diagnosis not present

## 2016-04-26 DIAGNOSIS — K219 Gastro-esophageal reflux disease without esophagitis: Secondary | ICD-10-CM | POA: Insufficient documentation

## 2016-04-26 DIAGNOSIS — Z794 Long term (current) use of insulin: Secondary | ICD-10-CM | POA: Diagnosis not present

## 2016-04-26 DIAGNOSIS — Z7982 Long term (current) use of aspirin: Secondary | ICD-10-CM | POA: Diagnosis not present

## 2016-04-26 DIAGNOSIS — Z951 Presence of aortocoronary bypass graft: Secondary | ICD-10-CM | POA: Insufficient documentation

## 2016-04-26 DIAGNOSIS — I25810 Atherosclerosis of coronary artery bypass graft(s) without angina pectoris: Secondary | ICD-10-CM | POA: Diagnosis not present

## 2016-04-26 DIAGNOSIS — I132 Hypertensive heart and chronic kidney disease with heart failure and with stage 5 chronic kidney disease, or end stage renal disease: Secondary | ICD-10-CM | POA: Diagnosis not present

## 2016-04-26 DIAGNOSIS — N184 Chronic kidney disease, stage 4 (severe): Secondary | ICD-10-CM | POA: Diagnosis not present

## 2016-04-26 DIAGNOSIS — J449 Chronic obstructive pulmonary disease, unspecified: Secondary | ICD-10-CM | POA: Insufficient documentation

## 2016-04-26 DIAGNOSIS — I5042 Chronic combined systolic (congestive) and diastolic (congestive) heart failure: Secondary | ICD-10-CM | POA: Insufficient documentation

## 2016-04-26 DIAGNOSIS — I2510 Atherosclerotic heart disease of native coronary artery without angina pectoris: Secondary | ICD-10-CM | POA: Diagnosis not present

## 2016-04-26 DIAGNOSIS — I252 Old myocardial infarction: Secondary | ICD-10-CM | POA: Diagnosis not present

## 2016-04-26 DIAGNOSIS — I10 Essential (primary) hypertension: Secondary | ICD-10-CM | POA: Diagnosis not present

## 2016-04-26 DIAGNOSIS — E039 Hypothyroidism, unspecified: Secondary | ICD-10-CM | POA: Insufficient documentation

## 2016-04-26 DIAGNOSIS — E1151 Type 2 diabetes mellitus with diabetic peripheral angiopathy without gangrene: Secondary | ICD-10-CM | POA: Diagnosis not present

## 2016-04-26 DIAGNOSIS — E0851 Diabetes mellitus due to underlying condition with diabetic peripheral angiopathy without gangrene: Secondary | ICD-10-CM | POA: Diagnosis not present

## 2016-04-26 DIAGNOSIS — Z6839 Body mass index (BMI) 39.0-39.9, adult: Secondary | ICD-10-CM | POA: Insufficient documentation

## 2016-04-26 DIAGNOSIS — E669 Obesity, unspecified: Secondary | ICD-10-CM | POA: Diagnosis not present

## 2016-04-26 DIAGNOSIS — N185 Chronic kidney disease, stage 5: Secondary | ICD-10-CM | POA: Diagnosis not present

## 2016-04-26 MED ORDER — ATORVASTATIN CALCIUM 40 MG PO TABS: 40.0000 mg | ORAL_TABLET | Freq: Every day | ORAL | 3 refills | Status: DC

## 2016-04-26 MED ORDER — FLUTICASONE-SALMETEROL 250-50 MCG/DOSE IN AEPB: 1.0000 | INHALATION_SPRAY | Freq: Two times a day (BID) | RESPIRATORY_TRACT | 3 refills | Status: DC

## 2016-04-26 MED ORDER — FLUTICASONE-SALMETEROL 100-50 MCG/DOSE IN AEPB: 1.0000 | INHALATION_SPRAY | Freq: Two times a day (BID) | RESPIRATORY_TRACT | 3 refills | Status: DC

## 2016-04-26 NOTE — Patient Outreach (Signed)
    Unsuccessful attempt made to contact patient via telephone to assess her need for community care coordination.  Plan: Will make another attempt later this month

## 2016-04-26 NOTE — Telephone Encounter (Signed)
Writer was able to give patient results in person when she was in clinic today.  Patient acknowledged results.

## 2016-04-26 NOTE — Progress Notes (Signed)
Needs lipitor refilled

## 2016-04-26 NOTE — Telephone Encounter (Signed)
-----   Message from Arnoldo Morale, MD sent at 04/22/2016 12:11 PM EST ----- Pulmonary function test does reveal severe obstructive airway disease and some restrictive disease. Continue current medications.

## 2016-04-26 NOTE — BH Specialist Note (Signed)
Session Start time: 2:40 PM   End Time: 3:10 PM Total Time:  30 minutes Type of Service: [place]: No.   Interpreter Name & Language: N/A # [facility] Visits July 2017-June 2018: 2nd   SUBJECTIVE: Brooke Stafford is a 61 y.o. female  Pt. was referred by Dr. Jarold Song for:  anxiety and depression. Pt. reports the following symptoms/concerns: overwhelming feelings of sadness, worry, difficulty sleeping, and difficulty concentrating Duration of problem:  Eight months Severity: severe Previous treatment: Pt is currently receiving therapy and taking prescribed medication for mental health   OBJECTIVE: Mood: Pleasant & Affect: Appropriate Risk of harm to self or others: Pt denied SI/HI Assessments administered: PHQ-9; GAD-7  LIFE CONTEXT:  Family & Social: Pt resides with a friend that provides emotional and financial support. She has two best friends who reside nearby and is involved in church   School/ Work: Pt receives disability ($960) and food stamps ($85). She has active Medicaid and Medicare and has applied for housing assistance Self-Care: Pt denied substance use Life changes: Pt is currently grieving the loss of her adult son who passed in April 2017 What is important to pt/family (values): Family, Spirituality, Good Health   GOALS ADDRESSED:  Decrease symptoms of anxiety Decrease symptoms of depression  INTERVENTIONS: Solution Focused, Strength-based and Supportive   ASSESSMENT:  Pt currently experiencing depression and anxiety triggered by the loss of pt's adult son. Pt reports overwhelming feelings of sadness, worry, difficulty sleeping, and difficulty concentrating. Pt receives support from friends and the church. Pt may benefit from psychotherapy and medication management. She receives grief therapy through [facility] and care management through [employer].  Pt reported that she utilized the housing resources provided by [employer]  during prior visit and has obtained multiple housing options in [place] and [place] (where sister resides) Pt continues to comply with prescribed medication for mental and physical health, in addition, to using healthy coping skills to decrease symptoms.     PLAN: 1. F/U with behavioral health clinician: Pt was encouraged tocontact LCSWA if symptoms worsen or fail to improveto schedule behavioral appointments at [facility]. 2. Behavioral Health meds: Hydroxyzine 3. Behavioral recommendations: LCSWA recommends that pt apply healthy coping skills discussed. Pt is encouraged to schedule follow up appointment with LCSWA 4. Referral: Brief Counseling/Psychotherapy and Supportive Counseling 5. From scale of 1-10, how likely are you to follow plan: 9/10   Rebekah Chesterfield, MSW, [place] Worker 04/29/16 9:35 AM  Warmhandoff:   Warm Hand Off Completed.

## 2016-04-26 NOTE — Progress Notes (Signed)
Transitional care Clinic   Date of telephone Encounter: 04/03/16  Hospitalization dates: 03/25/16 through 04/01/16  Subjective:    Patient ID: Brooke Stafford, female    DOB: 01/29/1956, 61 y.o.   MRN: [id number]  HPI She is a 61 year old female with a history of type 2 diabetes mellitus (A1c 5.9), hypertension, CHF (2-D echo 40-45% from 2-D echo of 12/2015), stage IV to V chronic kidney disease with recent hospitalization for acute respiratory failure with hypoxia secondary to CHF exacerbation in which she was diuresed and also placed on Levaquin for possible multifocal pneumonia. She had negative workup for pulmonary embolism.  2-D echo from 12/2015 reveals EF of 24-26%, grade 1 diastolic dysfunction, mildly to moderately reduced systolic function, diffuse hypokinesis.  At her last office visit she received prednisone for acute gout flare of the right knee which she reports has resolved at this time. Her blood pressure is elevated but she reports systolic blood pressure 834 at home and informs me her blood pressure is only elevated when she comes into this clinic.  Since her last visit she had a pulmonary function test which revealed severe obstructive airway disease which is reversible and some restrictive disease. She informs me she has to use albuterol nebulizer every 6 hours and is not on any maintenance medication. Chronic kidney disease is currently managed by nephrology.  Past Medical History:  Diagnosis Date  . Anemia   . Anginal pain ([place])   . Anxiety   . Arthritis    "stiff fingers and knees" (08/04/2013), (12/12/2014)  . Asthma   . CAD (coronary artery disease) 2002; 2015   CABG x 6 2002, cath 2011- med Rx stent DES VG-Diag  . CAD (coronary artery disease) of artery bypass graft; DES to VG-Diag 09/28/13 11/09/2013  . Cataract   . CHF (congestive heart failure) ([place])    "in 2002" (11/26/2012)  . Chronic bronchitis ([place])    "q year; in the winter"   . CKD (chronic kidney  disease)    stage 4, followed by [facility]  . Coronary artery disease 2002   CABG x 6. Cath 5/11- med Rx  . GERD (gastroesophageal reflux disease)   . Gout    "right big toe"  . Headache    "~ q week" (08/04/2013); "~ twice/month" (12/12/2014)  . History of blood transfusion 2002   "when I had OHS"  . Hyperlipidemia   . Hypertension   . Hypothyroid    treated  . Migraines    "couple times/year" (08/04/2013), (12/12/2014)  . Myocardial infarction 2000; 2002; 2011  . Obesity (BMI 35.0-39.9 without comorbidity)   . Peripheral vascular disease ([place]) 12/12   LSFA PTA  . Pneumonia    "3 times I think" (12/12/2014)  . Type II diabetes mellitus ([place])     Past Surgical History:  Procedure Laterality Date  . ABDOMINAL AORTAGRAM N/A 04/05/2011   Procedure: ABDOMINAL AORTAGRAM;  Surgeon: Lorretta Harp, MD;  Location: [facility] CATH LAB;  Service: Cardiovascular;  Laterality: N/A;  . APPENDECTOMY  1980  . BREAST CYST EXCISION Right 1970's  . CARDIAC CATHETERIZATION  2002  . CARDIAC CATHETERIZATION N/A 12/12/2014   Procedure: Left Heart Cath and Cors/Grafts Angiography;  Surgeon: Peter M Martinique, MD;  Location: [place] CV LAB;  Service: Cardiovascular;  Laterality: N/A;  . CARDIAC CATHETERIZATION N/A 12/12/2014   Procedure: Coronary Stent Intervention;  Surgeon: Peter M Martinique, MD;  Location: [place] CV LAB;  Service: Cardiovascular;  Laterality: N/A;  .  CESAREAN SECTION  1978; 1980  . CHOLECYSTECTOMY  1982  . CORONARY ANGIOPLASTY WITH STENT PLACEMENT  2004; 2012   "I have 2 stents" (08/04/2013)  . CORONARY ANGIOPLASTY WITH STENT PLACEMENT  09/28/13   PTCA/ DES Xience stent to VG-Diag   . CORONARY ARTERY BYPASS GRAFT  11/20/2000   x6 LIMA to distal LAD, svg to first diag, svg to ramus intermediate branch and swquential SVG to cir marginal branch, SVG to posterior descending coronary and sequential SVG to first right posterolateral branch  . LEFT HEART CATHETERIZATION WITH CORONARY/GRAFT  ANGIOGRAM N/A 09/28/2013   Procedure: LEFT HEART CATHETERIZATION WITH Beatrix Fetters;  Surgeon: Peter M Martinique, MD;  Location: [facility] CATH LAB;  Service: Cardiovascular;  Laterality: N/A;  . LOWER EXTREMITY ANGIOGRAM  12/01/2012   Procedure: LOWER EXTREMITY ANGIOGRAM;  Surgeon: Lorretta Harp, MD;  Location: [facility] CATH LAB;  Service: Cardiovascular;;  . NM MYOCAR PERF WALL MOTION  08/27/2004   negative  . PERCUTANEOUS STENT INTERVENTION Left 12/01/2012   Procedure: PERCUTANEOUS STENT INTERVENTION;  Surgeon: Lorretta Harp, MD;  Location: [facility] CATH LAB;  Service: Cardiovascular;  Laterality: Left;  Left SFA  . PERIPHERAL ARTERIAL STENT GRAFT Left    SFA/notes 04/07/2011 (11/30/2012)  . RENAL ANGIOGRAM N/A 04/05/2011   Procedure: RENAL ANGIOGRAM;  Surgeon: Lorretta Harp, MD;  Location: [facility] CATH LAB;  Service: Cardiovascular;  Laterality: N/A;  . TUBAL LIGATION  1980    Allergies  Allergen Reactions  . Digoxin And Related Other (See Comments)    Patient stated she almost died. Had flu like symptoms as well as diarrhea.  . Hydralazine Shortness Of Breath  . Lisinopril Other (See Comments)    Felt like she had the flu. Was very sick!!!  . Penicillins Cross Reactors Hives    And high fever  . Adhesive [Tape] Rash    bruising    Current Outpatient Prescriptions on File Prior to Visit  Medication Sig Dispense Refill  . acetaminophen-codeine (TYLENOL #3) 300-30 MG tablet Take 1 tablet by mouth every 4 (four) hours as needed for moderate pain. 60 tablet 0  . albuterol (PROVENTIL HFA;VENTOLIN HFA) 108 (90 Base) MCG/ACT inhaler Inhale 2 puffs into the lungs every 6 (six) hours as needed for wheezing or shortness of breath. 3 Inhaler 3  . albuterol (PROVENTIL) (2.5 MG/3ML) 0.083% nebulizer solution Take 3 mLs (2.5 mg total) by nebulization every 6 (six) hours as needed for wheezing or shortness of breath. 150 mL 3  . allopurinol (ZYLOPRIM) 300 MG tablet Take 1 tablet (300 mg total) by mouth  daily. (Patient taking differently: Take 300 mg by mouth every evening. ) 90 tablet 3  . amLODipine (NORVASC) 10 MG tablet take 1 tablet by mouth once daily (Patient taking differently: Take 10 mg by mouth daily) 30 tablet 11  . aspirin 81 MG chewable tablet Chew 1 tablet (81 mg total) by mouth daily. 30 tablet 10  . B-D UF III MINI PEN NEEDLES 31G X 5 MM MISC USE TO INJECT INSULIN 4 TIMES A DAY AS INSTRUCTED 100 each 5  . cloNIDine (CATAPRES) 0.2 MG tablet Take 1 tablet (0.2 mg total) by mouth 3 (three) times daily. 90 tablet 3  . clopidogrel (PLAVIX) 75 MG tablet Take 1 tablet (75 mg total) by mouth daily. 90 tablet 3  . ezetimibe (ZETIA) 10 MG tablet Take 1 tablet (10 mg total) by mouth daily. 90 tablet 3  . fluticasone (FLONASE) 50 MCG/ACT nasal spray Place 2 sprays into  both nostrils daily as needed for allergies or rhinitis. 16 g 3  . furosemide (LASIX) 20 MG tablet Take a total of 60 mg in the morning and 40 mg at night. (Patient taking differently: Take 80 mg by mouth daily. ) 150 tablet 3  . gabapentin (NEURONTIN) 400 MG capsule take 1 capsule by mouth three times a day (Patient taking differently: Take 400 mg by mouth twice daily) 270 capsule 0  . hydrOXYzine (ATARAX/VISTARIL) 25 MG tablet Take 1 tablet (25 mg total) by mouth 3 (three) times daily. 90 tablet 3  . insulin aspart (NOVOLOG FLEXPEN) 100 UNIT/ML FlexPen Inject 10-30 units into the skin 3 times daily, plus sliding scale as instructed. (Patient taking differently: Inject 10-20 Units into the skin 3 (three) times daily with meals. Inject 10-30 units into the skin 3 times daily, plus sliding scale as instructed.) 30 mL 5  . Insulin Glargine (TOUJEO SOLOSTAR) 300 UNIT/ML SOPN Inject 35 Units into the skin at bedtime. (Patient taking differently: Inject 30 Units into the skin at bedtime. ) 6 pen 5  . isosorbide mononitrate (IMDUR) 60 MG 24 hr tablet Take 2 tablets (120 mg total) by mouth daily. 180 tablet 3  . levothyroxine (SYNTHROID,  LEVOTHROID) 25 MCG tablet take 1 tablet by mouth every morning BEFORE BREAKFAST (Patient taking differently: TAKE 25 MCG BY MOUTH EACH MORNING) 30 tablet 2  . loratadine (CLARITIN) 10 MG tablet Take 1 tablet (10 mg total) by mouth daily as needed for allergies. 30 tablet 11  . magnesium citrate SOLN Take 296 mLs (1 Bottle total) by mouth as needed for severe constipation. 195 mL 5  . metoprolol succinate (TOPROL-XL) 50 MG 24 hr tablet Take 3 tablets (150 mg total) by mouth daily. Take with or immediately following a meal. 90 tablet 5  . Naphazoline HCl (CLEAR EYES OP) Place 1 drop into both eyes as needed (for dry eyes).    . naproxen (NAPROSYN) 500 MG tablet Take 1 tablet (500 mg total) by mouth 2 (two) times daily. 30 tablet 0  . nitroGLYCERIN (NITROSTAT) 0.4 MG SL tablet Place 1 tablet (0.4 mg total) under the tongue every 5 (five) minutes as needed. 30 tablet 2  . prednisoLONE acetate (PRED FORTE) 1 % ophthalmic suspension Place 1 drop into both eyes 3 (three) times daily.  1  . ranitidine (ZANTAC 75) 75 MG tablet Take 1 tablet (75 mg total) by mouth 2 (two) times daily. (Patient taking differently: Take 75 mg by mouth 2 (two) times daily as needed for heartburn. ) 60 tablet 3  . senna-docusate (SENOKOT-S) 8.6-50 MG tablet Take 1 tablet by mouth at bedtime as needed for mild constipation. 30 tablet 0  . simethicone (MYLICON) 270 MG chewable tablet Chew 125 mg by mouth every 6 (six) hours as needed for flatulence.    . triamcinolone ointment (KENALOG) 0.5 % Apply 1 application topically 2 (two) times daily. (Patient taking differently: Apply 1 application topically daily as needed (for skin). ) 30 g 2  . traZODone (DESYREL) 50 MG tablet Take 1 tablet (50 mg total) by mouth at bedtime as needed for sleep. (Patient not taking: Reported on 04/26/2016) 30 tablet 3   No current facility-administered medications on file prior to visit.      Review of Systems  Constitutional: Negative for activity  change, appetite change and fatigue.  HENT: Negative for congestion, sinus pressure and sore throat.   Eyes: Negative for visual disturbance.  Respiratory: Negative for cough, chest tightness,  shortness of breath and wheezing.   Cardiovascular: Positive for leg swelling. Negative for chest pain and palpitations.  Gastrointestinal: Negative for abdominal distention, abdominal pain and constipation.  Endocrine: Negative for polydipsia.  Genitourinary: Negative for dysuria and frequency.  Musculoskeletal: Negative for arthralgias and back pain.  Skin: Negative for rash.  Neurological: Negative for tremors, light-headedness and numbness.  Hematological: Does not bruise/bleed easily.  Psychiatric/Behavioral: Negative for agitation and behavioral problems.       Objective: Vitals:   04/26/16 1357  BP: (!) 160/81  Pulse: 71  Temp: 97.3 F (36.3 C)  TempSrc: Oral  SpO2: 97%  Weight: 230 lb 9.6 oz (104.6 kg)  Height: 5' 10.5" (1.791 m)      Physical Exam Constitutional: She is oriented to person, place, and time. She appears well-developed and well-nourished.  Cardiovascular: Normal rate, normal heart sounds and intact distal pulses.   No murmur heard. Pulmonary/Chest: Effort normal and breath sounds normal. She has no wheezes. She has no rales. She exhibits no tenderness.  Abdominal: Soft. Bowel sounds are normal. She exhibits no distension and no mass. There is no tenderness.  Musculoskeletal: She exhibits 1+ pitting pedal edema in the right lower extremity Neurological: She is alert and oriented to person, place, and time.  Skin: Skin is warm and dry.  Psychiatric: She has a normal mood and affect.     Lab Results  Component Value Date   HGBA1C 5.9 03/15/2016       Assessment & Plan:  1. Diabetes mellitus due to underlying condition with diabetic peripheral angiopathy without gangrene, with long-term current use of insulin (HCC) Controlled with A1c of 5.9 Continue Toujeo  and NovoLog Keep appointment with endocrinology  - Glucose (CBG)  2. CKD (chronic kidney disease) stage 5, GFR less than 15 ml/min (HCC) Avoid nephrotoxic agents Keep appointment with nephrology  3. Gout Acute flare has resolved Avoid foods that trigger gout  4. Essential hypertension Uncontrolled Blood pressure was controlled at home as the patient She does seem to have some underlying whitecoat hypertension.  Low-sodium diet  5. Chronic combined systolic and diastolic congestive heart failure (HCC) EF of 49-70%, grade 1 diastolic dysfunction from 2-D echo of 12/2015 Euvolemic Limit daily fluid intake to less than 2 L, daily weights, low sodium diet  6. COPD Severe obstructive airway disease which is reversible according to pulmonary function test Advair added to regimen Educated Proventil and albuterol nebulizer solutions are only for rescue and not maintenance medications

## 2016-05-02 ENCOUNTER — Inpatient Hospital Stay (HOSPITAL_COMMUNITY)
Admission: EM | Admit: 2016-05-02 | Discharge: 2016-05-14 | DRG: 981 | Disposition: A | Discharge status: Skilled Nursing Facility | Payer: Medicare Other | Attending: Nephrology | Admitting: Nephrology

## 2016-05-02 ENCOUNTER — Emergency Department (HOSPITAL_COMMUNITY): Payer: Medicare Other

## 2016-05-02 ENCOUNTER — Encounter (HOSPITAL_COMMUNITY): Payer: Self-pay

## 2016-05-02 DIAGNOSIS — E1159 Type 2 diabetes mellitus with other circulatory complications: Secondary | ICD-10-CM | POA: Diagnosis present

## 2016-05-02 DIAGNOSIS — I5041 Acute combined systolic (congestive) and diastolic (congestive) heart failure: Secondary | ICD-10-CM | POA: Diagnosis present

## 2016-05-02 DIAGNOSIS — Z955 Presence of coronary angioplasty implant and graft: Secondary | ICD-10-CM

## 2016-05-02 DIAGNOSIS — R6520 Severe sepsis without septic shock: Secondary | ICD-10-CM | POA: Diagnosis not present

## 2016-05-02 DIAGNOSIS — I2510 Atherosclerotic heart disease of native coronary artery without angina pectoris: Secondary | ICD-10-CM

## 2016-05-02 DIAGNOSIS — E785 Hyperlipidemia, unspecified: Secondary | ICD-10-CM | POA: Diagnosis present

## 2016-05-02 DIAGNOSIS — K219 Gastro-esophageal reflux disease without esophagitis: Secondary | ICD-10-CM | POA: Diagnosis present

## 2016-05-02 DIAGNOSIS — E872 Acidosis, unspecified: Secondary | ICD-10-CM

## 2016-05-02 DIAGNOSIS — D638 Anemia in other chronic diseases classified elsewhere: Secondary | ICD-10-CM | POA: Diagnosis not present

## 2016-05-02 DIAGNOSIS — R0600 Dyspnea, unspecified: Secondary | ICD-10-CM | POA: Diagnosis not present

## 2016-05-02 DIAGNOSIS — E1122 Type 2 diabetes mellitus with diabetic chronic kidney disease: Secondary | ICD-10-CM | POA: Diagnosis present

## 2016-05-02 DIAGNOSIS — J449 Chronic obstructive pulmonary disease, unspecified: Secondary | ICD-10-CM | POA: Diagnosis not present

## 2016-05-02 DIAGNOSIS — D696 Thrombocytopenia, unspecified: Secondary | ICD-10-CM | POA: Diagnosis not present

## 2016-05-02 DIAGNOSIS — I255 Ischemic cardiomyopathy: Secondary | ICD-10-CM | POA: Diagnosis present

## 2016-05-02 DIAGNOSIS — E11319 Type 2 diabetes mellitus with unspecified diabetic retinopathy without macular edema: Secondary | ICD-10-CM | POA: Diagnosis present

## 2016-05-02 DIAGNOSIS — I252 Old myocardial infarction: Secondary | ICD-10-CM | POA: Diagnosis not present

## 2016-05-02 DIAGNOSIS — R7989 Other specified abnormal findings of blood chemistry: Secondary | ICD-10-CM

## 2016-05-02 DIAGNOSIS — J811 Chronic pulmonary edema: Secondary | ICD-10-CM

## 2016-05-02 DIAGNOSIS — J9690 Respiratory failure, unspecified, unspecified whether with hypoxia or hypercapnia: Secondary | ICD-10-CM | POA: Diagnosis not present

## 2016-05-02 DIAGNOSIS — J189 Pneumonia, unspecified organism: Secondary | ICD-10-CM

## 2016-05-02 DIAGNOSIS — D631 Anemia in chronic kidney disease: Secondary | ICD-10-CM | POA: Diagnosis present

## 2016-05-02 DIAGNOSIS — I5021 Acute systolic (congestive) heart failure: Secondary | ICD-10-CM | POA: Diagnosis not present

## 2016-05-02 DIAGNOSIS — Z992 Dependence on renal dialysis: Secondary | ICD-10-CM

## 2016-05-02 DIAGNOSIS — R05 Cough: Secondary | ICD-10-CM | POA: Diagnosis not present

## 2016-05-02 DIAGNOSIS — E875 Hyperkalemia: Secondary | ICD-10-CM | POA: Diagnosis not present

## 2016-05-02 DIAGNOSIS — N2581 Secondary hyperparathyroidism of renal origin: Secondary | ICD-10-CM | POA: Diagnosis present

## 2016-05-02 DIAGNOSIS — J441 Chronic obstructive pulmonary disease with (acute) exacerbation: Secondary | ICD-10-CM | POA: Diagnosis not present

## 2016-05-02 DIAGNOSIS — Z9861 Coronary angioplasty status: Secondary | ICD-10-CM

## 2016-05-02 DIAGNOSIS — E1140 Type 2 diabetes mellitus with diabetic neuropathy, unspecified: Secondary | ICD-10-CM | POA: Diagnosis present

## 2016-05-02 DIAGNOSIS — N179 Acute kidney failure, unspecified: Secondary | ICD-10-CM | POA: Diagnosis not present

## 2016-05-02 DIAGNOSIS — R531 Weakness: Secondary | ICD-10-CM | POA: Diagnosis not present

## 2016-05-02 DIAGNOSIS — Z79899 Other long term (current) drug therapy: Secondary | ICD-10-CM

## 2016-05-02 DIAGNOSIS — Z794 Long term (current) use of insulin: Secondary | ICD-10-CM

## 2016-05-02 DIAGNOSIS — Z419 Encounter for procedure for purposes other than remedying health state, unspecified: Secondary | ICD-10-CM

## 2016-05-02 DIAGNOSIS — I132 Hypertensive heart and chronic kidney disease with heart failure and with stage 5 chronic kidney disease, or end stage renal disease: Secondary | ICD-10-CM | POA: Diagnosis present

## 2016-05-02 DIAGNOSIS — I509 Heart failure, unspecified: Secondary | ICD-10-CM | POA: Diagnosis not present

## 2016-05-02 DIAGNOSIS — Z87891 Personal history of nicotine dependence: Secondary | ICD-10-CM | POA: Diagnosis not present

## 2016-05-02 DIAGNOSIS — N189 Chronic kidney disease, unspecified: Secondary | ICD-10-CM | POA: Diagnosis not present

## 2016-05-02 DIAGNOSIS — R0902 Hypoxemia: Secondary | ICD-10-CM | POA: Diagnosis not present

## 2016-05-02 DIAGNOSIS — Z7902 Long term (current) use of antithrombotics/antiplatelets: Secondary | ICD-10-CM

## 2016-05-02 DIAGNOSIS — R069 Unspecified abnormalities of breathing: Secondary | ICD-10-CM | POA: Diagnosis not present

## 2016-05-02 DIAGNOSIS — I69351 Hemiplegia and hemiparesis following cerebral infarction affecting right dominant side: Secondary | ICD-10-CM

## 2016-05-02 DIAGNOSIS — J9600 Acute respiratory failure, unspecified whether with hypoxia or hypercapnia: Secondary | ICD-10-CM | POA: Diagnosis not present

## 2016-05-02 DIAGNOSIS — Z6830 Body mass index (BMI) 30.0-30.9, adult: Secondary | ICD-10-CM

## 2016-05-02 DIAGNOSIS — Z4659 Encounter for fitting and adjustment of other gastrointestinal appliance and device: Secondary | ICD-10-CM

## 2016-05-02 DIAGNOSIS — R0602 Shortness of breath: Secondary | ICD-10-CM

## 2016-05-02 DIAGNOSIS — N185 Chronic kidney disease, stage 5: Secondary | ICD-10-CM | POA: Diagnosis present

## 2016-05-02 DIAGNOSIS — Z4682 Encounter for fitting and adjustment of non-vascular catheter: Secondary | ICD-10-CM | POA: Diagnosis not present

## 2016-05-02 DIAGNOSIS — N186 End stage renal disease: Secondary | ICD-10-CM | POA: Diagnosis not present

## 2016-05-02 DIAGNOSIS — I10 Essential (primary) hypertension: Secondary | ICD-10-CM

## 2016-05-02 DIAGNOSIS — I120 Hypertensive chronic kidney disease with stage 5 chronic kidney disease or end stage renal disease: Secondary | ICD-10-CM | POA: Diagnosis not present

## 2016-05-02 DIAGNOSIS — A419 Sepsis, unspecified organism: Secondary | ICD-10-CM | POA: Diagnosis not present

## 2016-05-02 DIAGNOSIS — I501 Left ventricular failure: Secondary | ICD-10-CM

## 2016-05-02 DIAGNOSIS — F419 Anxiety disorder, unspecified: Secondary | ICD-10-CM | POA: Diagnosis present

## 2016-05-02 DIAGNOSIS — J440 Chronic obstructive pulmonary disease with acute lower respiratory infection: Secondary | ICD-10-CM | POA: Diagnosis not present

## 2016-05-02 DIAGNOSIS — E8770 Fluid overload, unspecified: Secondary | ICD-10-CM | POA: Diagnosis not present

## 2016-05-02 DIAGNOSIS — E1151 Type 2 diabetes mellitus with diabetic peripheral angiopathy without gangrene: Secondary | ICD-10-CM | POA: Diagnosis present

## 2016-05-02 DIAGNOSIS — E039 Hypothyroidism, unspecified: Secondary | ICD-10-CM | POA: Diagnosis not present

## 2016-05-02 DIAGNOSIS — Z452 Encounter for adjustment and management of vascular access device: Secondary | ICD-10-CM | POA: Diagnosis not present

## 2016-05-02 DIAGNOSIS — J159 Unspecified bacterial pneumonia: Secondary | ICD-10-CM | POA: Diagnosis not present

## 2016-05-02 DIAGNOSIS — E0851 Diabetes mellitus due to underlying condition with diabetic peripheral angiopathy without gangrene: Secondary | ICD-10-CM | POA: Diagnosis not present

## 2016-05-02 DIAGNOSIS — Z95828 Presence of other vascular implants and grafts: Secondary | ICD-10-CM

## 2016-05-02 DIAGNOSIS — Z7951 Long term (current) use of inhaled steroids: Secondary | ICD-10-CM

## 2016-05-02 DIAGNOSIS — M79642 Pain in left hand: Secondary | ICD-10-CM | POA: Diagnosis not present

## 2016-05-02 DIAGNOSIS — Z7982 Long term (current) use of aspirin: Secondary | ICD-10-CM

## 2016-05-02 DIAGNOSIS — E876 Hypokalemia: Secondary | ICD-10-CM

## 2016-05-02 DIAGNOSIS — J9601 Acute respiratory failure with hypoxia: Secondary | ICD-10-CM | POA: Diagnosis not present

## 2016-05-02 DIAGNOSIS — I5040 Unspecified combined systolic (congestive) and diastolic (congestive) heart failure: Secondary | ICD-10-CM | POA: Diagnosis not present

## 2016-05-02 DIAGNOSIS — E669 Obesity, unspecified: Secondary | ICD-10-CM | POA: Diagnosis present

## 2016-05-02 DIAGNOSIS — M109 Gout, unspecified: Secondary | ICD-10-CM | POA: Diagnosis present

## 2016-05-02 DIAGNOSIS — J810 Acute pulmonary edema: Secondary | ICD-10-CM | POA: Diagnosis not present

## 2016-05-02 DIAGNOSIS — Z978 Presence of other specified devices: Secondary | ICD-10-CM

## 2016-05-02 DIAGNOSIS — Z4901 Encounter for fitting and adjustment of extracorporeal dialysis catheter: Secondary | ICD-10-CM | POA: Diagnosis not present

## 2016-05-02 DIAGNOSIS — Z791 Long term (current) use of non-steroidal anti-inflammatories (NSAID): Secondary | ICD-10-CM

## 2016-05-02 LAB — I-STAT VENOUS BLOOD GAS, ED
ACID-BASE DEFICIT: 6 mmol/L — AB (ref 0.0–2.0)
Bicarbonate: 21.2 mmol/L (ref 20.0–28.0)
O2 SAT: 40 %
TCO2: 23 mmol/L (ref 0–100)
pCO2, Ven: 50.3 mmHg (ref 44.0–60.0)
pH, Ven: 7.233 — ABNORMAL LOW (ref 7.250–7.430)
pO2, Ven: 27 mmHg — CL (ref 32.0–45.0)

## 2016-05-02 LAB — I-STAT CHEM 8, ED
BUN: 35 mg/dL — ABNORMAL HIGH (ref 6–20)
CALCIUM ION: 1.19 mmol/L (ref 1.15–1.40)
CHLORIDE: 110 mmol/L (ref 101–111)
Creatinine, Ser: 4.1 mg/dL — ABNORMAL HIGH (ref 0.44–1.00)
Glucose, Bld: 187 mg/dL — ABNORMAL HIGH (ref 65–99)
HCT: 37 % (ref 36.0–46.0)
Hemoglobin: 12.6 g/dL (ref 12.0–15.0)
Potassium: 3.4 mmol/L — ABNORMAL LOW (ref 3.5–5.1)
SODIUM: 145 mmol/L (ref 135–145)
TCO2: 23 mmol/L (ref 0–100)

## 2016-05-02 LAB — CBC WITH DIFFERENTIAL/PLATELET
Basophils Absolute: 0 K/uL (ref 0.0–0.1)
Basophils Relative: 0 %
Eosinophils Absolute: 0.6 K/uL (ref 0.0–0.7)
Eosinophils Relative: 8 %
HCT: 35.8 % — ABNORMAL LOW (ref 36.0–46.0)
Hemoglobin: 11.9 g/dL — ABNORMAL LOW (ref 12.0–15.0)
Lymphocytes Relative: 32 %
Lymphs Abs: 2.5 K/uL (ref 0.7–4.0)
MCH: 31 pg (ref 26.0–34.0)
MCHC: 33.2 g/dL (ref 30.0–36.0)
MCV: 93.2 fL (ref 78.0–100.0)
Monocytes Absolute: 0.3 K/uL (ref 0.1–1.0)
Monocytes Relative: 3 %
Neutro Abs: 4.5 K/uL (ref 1.7–7.7)
Neutrophils Relative %: 57 %
Platelets: 184 K/uL (ref 150–400)
RBC: 3.84 MIL/uL — ABNORMAL LOW (ref 3.87–5.11)
RDW: 15.8 % — ABNORMAL HIGH (ref 11.5–15.5)
WBC: 7.9 K/uL (ref 4.0–10.5)

## 2016-05-02 LAB — BASIC METABOLIC PANEL WITH GFR
Anion gap: 17 — ABNORMAL HIGH (ref 5–15)
BUN: 35 mg/dL — ABNORMAL HIGH (ref 6–20)
CO2: 19 mmol/L — ABNORMAL LOW (ref 22–32)
Calcium: 9.8 mg/dL (ref 8.9–10.3)
Chloride: 107 mmol/L (ref 101–111)
Creatinine, Ser: 3.92 mg/dL — ABNORMAL HIGH (ref 0.44–1.00)
GFR calc Af Amer: 13 mL/min — ABNORMAL LOW
GFR calc non Af Amer: 11 mL/min — ABNORMAL LOW
Glucose, Bld: 190 mg/dL — ABNORMAL HIGH (ref 65–99)
Potassium: 3.4 mmol/L — ABNORMAL LOW (ref 3.5–5.1)
Sodium: 143 mmol/L (ref 135–145)

## 2016-05-02 LAB — I-STAT TROPONIN, ED: TROPONIN I, POC: 0.08 ng/mL (ref 0.00–0.08)

## 2016-05-02 MED ORDER — DEXTROSE 5 % IV SOLN
1.0000 g | Freq: Once | INTRAVENOUS | Status: AC
  Administered 2016-05-02: 1 g via INTRAVENOUS
  Filled 2016-05-02: qty 10

## 2016-05-02 MED ORDER — ALBUTEROL (5 MG/ML) CONTINUOUS INHALATION SOLN
20.0000 mg/h | INHALATION_SOLUTION | Freq: Once | RESPIRATORY_TRACT | Status: AC
  Administered 2016-05-02: 20 mg/h via RESPIRATORY_TRACT
  Filled 2016-05-02: qty 20

## 2016-05-02 MED ORDER — AZITHROMYCIN 500 MG IV SOLR
500.0000 mg | Freq: Once | INTRAVENOUS | Status: AC
  Administered 2016-05-02: 500 mg via INTRAVENOUS
  Filled 2016-05-02: qty 500

## 2016-05-02 NOTE — ED Notes (Addendum)
Pt taken off Bipap pt tolerating well pt on NRB

## 2016-05-02 NOTE — ED Notes (Signed)
RN remains at bedside attempting greater IV access than what pt. Currently has pt appears in no distress and reports improvement in her symptoms pt remains on the Bipap

## 2016-05-02 NOTE — ED Triage Notes (Signed)
Pt brought in by EMS for increases SOB and coughing up blood pt arrives able to answer questions appropriately ER MD at [facility]

## 2016-05-02 NOTE — ED Provider Notes (Signed)
[facility] DEPT Provider Note  CSN: [id number] Arrival date & time: 05/02/16  2137   History   Chief Complaint Chief Complaint  Patient presents with  . Shortness of Breath    Pt arrives on CPAP had some hemoptysis at home arrives on CPAP    HPI Brooke Stafford is a 61 y.o. female.  The history is provided by the patient. No language interpreter was used.  Illness  This is a new problem. The current episode started 2 days ago. The problem occurs constantly. The problem has been rapidly worsening. Associated symptoms include chest pain and shortness of breath. Pertinent negatives include no abdominal pain and no headaches. The symptoms are aggravated by walking, exertion and coughing. Nothing relieves the symptoms.    Past Medical History:  Diagnosis Date  . Anemia   . Anginal pain ([place])   . Anxiety   . Arthritis    "stiff fingers and knees" (08/04/2013), (12/12/2014)  . Asthma   . CAD (coronary artery disease) 2002; 2015   CABG x 6 2002, cath 2011- med Rx stent DES VG-Diag  . CAD (coronary artery disease) of artery bypass graft; DES to VG-Diag 09/28/13 11/09/2013  . Cataract   . CHF (congestive heart failure) ([place])    "in 2002" (11/26/2012)  . Chronic bronchitis ([place])    "q year; in the winter"   . CKD (chronic kidney disease)    stage 4, followed by [facility]  . Coronary artery disease 2002   CABG x 6. Cath 5/11- med Rx  . GERD (gastroesophageal reflux disease)   . Gout    "right big toe"  . Headache    "~ q week" (08/04/2013); "~ twice/month" (12/12/2014)  . History of blood transfusion 2002   "when I had OHS"  . Hyperlipidemia   . Hypertension   . Hypothyroid    treated  . Migraines    "couple times/year" (08/04/2013), (12/12/2014)  . Myocardial infarction 2000; 2002; 2011  . Obesity (BMI 35.0-39.9 without comorbidity)   . Peripheral vascular disease ([place]) 12/12   LSFA PTA  . Pneumonia    "3 times I think" (12/12/2014)  . Type II diabetes mellitus [facility])      Patient Active Problem List   Diagnosis Date Noted  . Hypokalemia 05/03/2016  . COPD (chronic obstructive pulmonary disease) ([place]) 04/11/2016  . CAP (community acquired pneumonia) 03/26/2016  . Acute respiratory failure with hypoxia ([place]) 03/26/2016  . Acute on chronic combined systolic and diastolic CHF (congestive heart failure) ([place]) 03/26/2016  . Chronic kidney disease (CKD), stage IV (severe) ([place]) 03/26/2016  . Medication management 03/21/2016  . Anemia of chronic disease 12/27/2015  . Acute on chronic systolic CHF (congestive heart failure) ([place])   . Acute CHF ([place]) 12/18/2015  . CHF exacerbation ([place]) 12/18/2015  . Constipation 12/18/2015  . Adjustment disorder with anxious mood 11/02/2015  . Posterior circulation stroke ([place]) 11/02/2015  . Abnormality of gait 10/23/2015  . Depression 09/28/2015  . Diabetes mellitus with circulatory complication, with long-term current use of insulin ([place]) 09/13/2015  . Diabetic retinopathy ([place]) 09/13/2015  . Grief reaction 08/24/2015  . Acute kidney injury superimposed on chronic kidney disease ([place]) 07/25/2015  . Creatinine elevation 07/25/2015  . Chronic combined systolic and diastolic congestive heart failure ([place]) 07/25/2015  . Rash of hands 02/23/2015  . Essential hypertension   . Angina pectoris ([place]) 12/12/2014  . Abnormal nuclear stress test   . Acute combined systolic and diastolic heart failure ([place]) 12/02/2014  .  Seasonal allergies 08/11/2014  . Gout 08/11/2014  . Encounter for screening mammogram for breast cancer 05/16/2014  . Screening for colon cancer 05/16/2014  . Diabetic neuropathy, type II diabetes mellitus ([place]) 01/27/2014  . Atopic eczema 01/27/2014  . Precordial pain, atypical, negative MI, Musculature Skeletal pain  11/08/2013  . CKD (chronic kidney disease) stage 5, GFR less than 15 ml/min (HCC) 11/08/2013  . Unstable angina ([place]) 09/28/2013  . Ischemic cardiomyopathy- new drop in EF 08/30/2013  . Chest  pain 08/04/2013  . CAD -S/P PCI June 2015 and 12/14/14 02/01/2013  . Hypothyroidism, acquired 02/01/2013  . PVD, LSFA PTA 12/12 04/06/2011  . Hx of CABG x 6 2002 04/06/2011  . Dyslipidemia 04/06/2011   Past Surgical History:  Procedure Laterality Date  . ABDOMINAL AORTAGRAM N/A 04/05/2011   Procedure: ABDOMINAL AORTAGRAM;  Surgeon: Lorretta Harp, MD;  Location: [facility] CATH LAB;  Service: Cardiovascular;  Laterality: N/A;  . APPENDECTOMY  1980  . BREAST CYST EXCISION Right 1970's  . CARDIAC CATHETERIZATION  2002  . CARDIAC CATHETERIZATION N/A 12/12/2014   Procedure: Left Heart Cath and Cors/Grafts Angiography;  Surgeon: Peter M Martinique, MD;  Location: [facility] CV LAB;  Service: Cardiovascular;  Laterality: N/A;  . CARDIAC CATHETERIZATION N/A 12/12/2014   Procedure: Coronary Stent Intervention;  Surgeon: Peter M Martinique, MD;  Location: [place] CV LAB;  Service: Cardiovascular;  Laterality: N/A;  . [place]; 1980  . CHOLECYSTECTOMY  1982  . CORONARY ANGIOPLASTY WITH STENT PLACEMENT  2004; 2012   "I have 2 stents" (08/04/2013)  . CORONARY ANGIOPLASTY WITH STENT PLACEMENT  09/28/13   PTCA/ DES Xience stent to VG-Diag   . CORONARY ARTERY BYPASS GRAFT  11/20/2000   x6 LIMA to distal LAD, svg to first diag, svg to ramus intermediate branch and swquential SVG to cir marginal branch, SVG to posterior descending coronary and sequential SVG to first right posterolateral branch  . LEFT HEART CATHETERIZATION WITH CORONARY/GRAFT ANGIOGRAM N/A 09/28/2013   Procedure: LEFT HEART CATHETERIZATION WITH Beatrix Fetters;  Surgeon: Peter M Martinique, MD;  Location[facility] C[facility] LAB;  Service: Cardiovascular;  Laterality: N/A;  . LOWER EXTREMITY ANGIOGRAM  12/01/2012   Procedure: LOWER EXTREMITY ANGIOGRAM;  Surgeon: Lorretta Harp, MD;  Location: [facility] CATH LAB;  Service: Cardiovascular;;  . NM MYOCAR PERF WALL MOTION  08/27/2004   negative  . PERCUTANEOUS STENT INTERVENTION Left 12/01/2012    Procedure: PERCUTANEOUS STENT INTERVENTION;  Surgeon: Lorretta Harp, MD;  Location: [facility] CATH LAB;  Service: Cardiovascular;  Laterality: Left;  Left SFA  . PERIPHERAL ARTERIAL STENT GRAFT Left    SFA/notes 04/07/2011 (11/30/2012)  . RENAL ANGIOGRAM N/A 04/05/2011   Procedure: RENAL ANGIOGRAM;  Surgeon: Lorretta Harp, MD;  Location: [facility] CATH LAB;  Service: Cardiovascular;  Laterality: N/A;  . TUBAL LIGATION  1980   OB History    No data available      Home Medications    Prior to Admission medications   Medication Sig Start Date End Date Taking? Authorizing Provider  acetaminophen-codeine (TYLENOL #3) 300-30 MG tablet Take 1 tablet by mouth every 4 (four) hours as needed for moderate pain. 01/11/16   Tresa Garter, MD  albuterol (PROVENTIL HFA;VENTOLIN HFA) 108 (90 Base) MCG/ACT inhaler Inhale 2 puffs into the lungs every 6 (six) hours as needed for wheezing or shortness of breath. 09/28/15   Tresa Garter, MD  albuterol (PROVENTIL) (2.5 MG/3ML) 0.083% nebulizer solution Take 3 mLs (2.5 mg total) by  nebulization every 6 (six) hours as needed for wheezing or shortness of breath. 04/03/16   Tresa Garter, MD  allopurinol (ZYLOPRIM) 300 MG tablet Take 1 tablet (300 mg total) by mouth daily. Patient taking differently: Take 300 mg by mouth every evening.  02/22/16   Tresa Garter, MD  amLODipine (NORVASC) 10 MG tablet take 1 tablet by mouth once daily Patient taking differently: Take 10 mg by mouth daily 11/20/15   Sanda Klein, MD  aspirin 81 MG chewable tablet Chew 1 tablet (81 mg total) by mouth daily. 02/24/15   Mihai Croitoru, MD  atorvastatin (LIPITOR) 40 MG tablet Take 1 tablet (40 mg total) by mouth daily. 04/26/16   Arnoldo Morale, MD  B-D UF III MINI PEN NEEDLES 31G X 5 MM MISC USE TO INJECT INSULIN 4 TIMES A DAY AS INSTRUCTED 04/04/16   Philemon Kingdom, MD  cloNIDine (CATAPRES) 0.2 MG tablet Take 1 tablet (0.2 mg total) by mouth 3 (three) times daily. 02/22/16    Tresa Garter, MD  clopidogrel (PLAVIX) 75 MG tablet Take 1 tablet (75 mg total) by mouth daily. 09/25/15   Mihai Croitoru, MD  ezetimibe (ZETIA) 10 MG tablet Take 1 tablet (10 mg total) by mouth daily. 01/11/16   Tresa Garter, MD  fluticasone (FLONASE) 50 MCG/ACT nasal spray Place 2 sprays into both nostrils daily as needed for allergies or rhinitis. 09/28/15   Tresa Garter, MD  Fluticasone-Salmeterol (ADVAIR DISKUS) 250-50 MCG/DOSE AEPB Inhale 1 puff into the lungs 2 (two) times daily. 04/26/16   Arnoldo Morale, MD  furosemide (LASIX) 20 MG tablet Take a total of 60 mg in the morning and 40 mg at night. Patient taking differently: Take 80 mg by mouth daily.  03/21/16   Tresa Garter, MD  gabapentin (NEURONTIN) 400 MG capsule take 1 capsule by mouth three times a day Patient taking differently: Take 400 mg by mouth twice daily 01/24/16   Tresa Garter, MD  hydrOXYzine (ATARAX/VISTARIL) 25 MG tablet Take 1 tablet (25 mg total) by mouth 3 (three) times daily. 03/21/16   Tresa Garter, MD  insulin aspart (NOVOLOG FLEXPEN) 100 UNIT/ML FlexPen Inject 10-30 units into the skin 3 times daily, plus sliding scale as instructed. Patient taking differently: Inject 10-20 Units into the skin 3 (three) times daily with meals. Inject 10-30 units into the skin 3 times daily, plus sliding scale as instructed. 12/14/15   Philemon Kingdom, MD  Insulin Glargine (TOUJEO SOLOSTAR) 300 UNIT/ML SOPN Inject 35 Units into the skin at bedtime. Patient taking differently: Inject 30 Units into the skin at bedtime.  03/15/16   Philemon Kingdom, MD  isosorbide mononitrate (IMDUR) 60 MG 24 hr tablet Take 2 tablets (120 mg total) by mouth daily. 02/22/16   Tresa Garter, MD  levothyroxine (SYNTHROID, LEVOTHROID) 25 MCG tablet take 1 tablet by mouth every morning BEFORE BREAKFAST Patient taking differently: TAKE 25 MCG BY MOUTH EACH MORNING 03/26/16   Tresa Garter, MD  loratadine (CLARITIN)  10 MG tablet Take 1 tablet (10 mg total) by mouth daily as needed for allergies. 12/15/14   Erlene Quan, PA-C  magnesium citrate SOLN Take 296 mLs (1 Bottle total) by mouth as needed for severe constipation. 07/28/15   Ripudeep Krystal Eaton, MD  metoprolol succinate (TOPROL-XL) 50 MG 24 hr tablet Take 3 tablets (150 mg total) by mouth daily. Take with or immediately following a meal. 03/08/16   Evelene Croon Barrett, PA-C  Naphazoline HCl (CLEAR  EYES OP) Place 1 drop into both eyes as needed (for dry eyes).    Historical Provider, MD  naproxen (NAPROSYN) 500 MG tablet Take 1 tablet (500 mg total) by mouth 2 (two) times daily. 04/02/16   Shary Decamp, PA-C  nitroGLYCERIN (NITROSTAT) 0.4 MG SL tablet Place 1 tablet (0.4 mg total) under the tongue every 5 (five) minutes as needed. 04/11/16   Arnoldo Morale, MD  prednisoLONE acetate (PRED FORTE) 1 % ophthalmic suspension Place 1 drop into both eyes 3 (three) times daily. 03/25/16   Historical Provider, MD  ranitidine (ZANTAC 75) 75 MG tablet Take 1 tablet (75 mg total) by mouth 2 (two) times daily. Patient taking differently: Take 75 mg by mouth 2 (two) times daily as needed for heartburn.  07/28/15   Ripudeep Krystal Eaton, MD  senna-docusate (SENOKOT-S) 8.6-50 MG tablet Take 1 tablet by mouth at bedtime as needed for mild constipation. 12/23/15   Virgel Manifold, MD  simethicone (MYLICON) 161 MG chewable tablet Chew 125 mg by mouth every 6 (six) hours as needed for flatulence.    Historical Provider, MD  traZODone (DESYREL) 50 MG tablet Take 1 tablet (50 mg total) by mouth at bedtime as needed for sleep. Patient not taking: Reported on 04/26/2016 03/21/16   Tresa Garter, MD  triamcinolone ointment (KENALOG) 0.5 % Apply 1 application topically 2 (two) times daily. Patient taking differently: Apply 1 application topically daily as needed (for skin).  08/24/15   Tresa Garter, MD   Family History Family History  Problem Relation Age of Onset  . Diabetes Mother   .  Hypertension Mother   . Stroke Mother   . Hypertension Father   . Hypertension Brother   . Hypertension Sister   . Diabetes Sister   . Hyperlipidemia Sister    Social History Social History  Substance Use Topics  . Smoking status: Former Smoker    Packs/day: 0.00    Years: 25.00    Types: Cigarettes    Quit date: 04/04/2000  . Smokeless tobacco: Never Used  . Alcohol use No     Comment: 12/12/2014  "have a glass of red wine on my birthday q yr; that's it"    Allergies   Digoxin and related; Hydralazine; Lisinopril; Penicillins cross reactors; and Adhesive [tape]  Review of Systems Review of Systems  Constitutional: Positive for chills and fever (subjective).  Respiratory: Positive for cough, shortness of breath and wheezing.   Cardiovascular: Positive for chest pain and leg swelling.  Gastrointestinal: Negative for abdominal pain.  Neurological: Negative for headaches.  All other systems reviewed and are negative.   Physical Exam Updated Vital Signs BP 148/76   Pulse 81   Temp (!) 96.9 F (36.1 C) (Axillary)   Resp 22   Ht 5\' 10"  (1.778 m)   Wt 104.3 kg   LMP 10/13/2014 (Exact Date)   SpO2 93%   BMI 33.00 kg/m   Physical Exam  Constitutional: She is oriented to person, place, and time. No distress.  Obese elderly African-American female  HENT:  Head: Normocephalic and atraumatic.  Eyes: EOM are normal. Pupils are equal, round, and reactive to light.  Neck: Normal range of motion. Neck supple.  Cardiovascular: Normal rate, regular rhythm and normal heart sounds.   Mildly hypertensive  Pulmonary/Chest: She is in respiratory distress. She has wheezes.  Coarse breath sounds at bases bilaterally, tachypnea, increased work of breathing, hypoxia on room air with improvement in sats to mid 90s on nonrebreather  Abdominal: Soft. Bowel sounds are normal. She exhibits distension. There is no tenderness.  Musculoskeletal: Normal range of motion. She exhibits edema.   Neurological: She is alert and oriented to person, place, and time.  Skin: Skin is warm and dry. Capillary refill takes less than 2 seconds. She is not diaphoretic.  Nursing note and vitals reviewed.   ED Treatments / Results  Labs (all labs ordered are listed, but only abnormal results are displayed) Labs Reviewed  CBC WITH DIFFERENTIAL/PLATELET - Abnormal; Notable for the following:       Result Value   RBC 3.84 (*)    Hemoglobin 11.9 (*)    HCT 35.8 (*)    RDW 15.8 (*)    All other components within normal limits  BASIC METABOLIC PANEL - Abnormal; Notable for the following:    Potassium 3.4 (*)    CO2 19 (*)    Glucose, Bld 190 (*)    BUN 35 (*)    Creatinine, Ser 3.92 (*)    GFR calc non Af Amer 11 (*)    GFR calc Af Amer 13 (*)    Anion gap 17 (*)    All other components within normal limits  I-STAT CHEM 8, ED - Abnormal; Notable for the following:    Potassium 3.4 (*)    BUN 35 (*)    Creatinine, Ser 4.10 (*)    Glucose, Bld 187 (*)    All other components within normal limits  I-STAT VENOUS BLOOD GAS, ED - Abnormal; Notable for the following:    pH, Ven 7.233 (*)    pO2, Ven 27.0 (*)    Acid-base deficit 6.0 (*)    All other components within normal limits  RESPIRATORY PANEL BY PCR  BLOOD GAS, VENOUS  INFLUENZA PANEL BY PCR (TYPE A & B)  I-STAT TROPOININ, ED   EKG  EKG Interpretation None      Radiology Dg Chest Portable 1 View  Result Date: 05/02/2016 CLINICAL DATA:  Shortness of breath and cough for 1 day. Hypoxia. Decreased air movement. EXAM: PORTABLE CHEST 1 VIEW COMPARISON:  Radiographs 04/01/2016 FINDINGS: Patient is post median sternotomy and CABG. Cardiomegaly appears increased. Peribronchial cuffing and diffuse interstitial opacities consistent with pulmonary edema. Hazy bilateral lower lobe opacities likely combination of pleural effusion and airspace disease. No pneumothorax. Unchanged osseous structures. IMPRESSION: 1. Pulmonary edema, pleural  effusions and cardiomegaly, suspicious for CHF. 2. Hazy lower lobe opacities are likely a combination of pleural effusions and airspace disease, which may be atelectasis or pneumonia. Electronically Signed   By: Jeb Levering M.D.   On: 05/02/2016 22:11   Procedures Procedures (including critical care time)  Medications Ordered in ED Medications  azithromycin (ZITHROMAX) 500 mg in dextrose 5 % 250 mL IVPB (500 mg Intravenous New Bag/Given 05/02/16 2334)  albuterol (PROVENTIL,VENTOLIN) solution continuous neb (20 mg/hr Nebulization Given 05/02/16 2216)  cefTRIAXone (ROCEPHIN) 1 g in dextrose 5 % 50 mL IVPB (0 g Intravenous Stopped 05/02/16 2334)    Initial Impression / Assessment and Plan / ED Course  I have reviewed the triage vital signs and the nursing notes.  61 y.o. female with above stated PMHx, HPI, and physical. PMHx of COPD, CAD (s/p stents & CABG), & CHF. Symptom onset the past 48 hours. Productive cough, wheeze, shortness of breath, subjective fever, chills. EMS called and patient found to be in respiratory distress. Hypoxic to 60s on room air. Patient placed on nonrebreather and given albuterol nebulizer, Atrovent, Solu-Medrol 125 mg  IV, and magnesium 2 g IV. Patient subjectively feeling better upon arrival.  Patient started on continuous albuterol treatment via NRB. Chest x-ray showing lower lobe opacity, and given recent productive cough with subjective fever as well as hypoxia and increased work of breathing - will cover patient for any acquired pneumonia with Rocephin and azithromycin.  Laboratory and imaging results were personally reviewed by myself and used in the medical decision making of this patient's treatment and disposition.  Pt admitted to [facility] for further evaluation and management of COPD exacerbation with PNA. Pt understands and agrees with the plan and has no further questions or concerns.   Pt care discussed with and followed by my attending, Dr. Dollene Cleveland, MD Pager [phone]  Final Clinical Impressions(s) / ED Diagnoses   Final diagnoses:  COPD exacerbation ([place])  SOB (shortness of breath)  Community acquired pneumonia  Acidosis  Chronic kidney disease  Azotemia   New Prescriptions New Prescriptions   No medications on file     Mayer Camel, MD 05/03/16 0007    Gareth Morgan, MD 05/10/16 1014

## 2016-05-02 NOTE — ED Notes (Signed)
Pt arrives RT and ER MD to room Phleb at bedside pt on CPAP appears in mild distress able to answer questions appropriately pt placed on BiPap pt with improved work of breathing

## 2016-05-03 ENCOUNTER — Other Ambulatory Visit: Payer: Self-pay | Admitting: *Deleted

## 2016-05-03 ENCOUNTER — Telehealth: Payer: Self-pay | Admitting: Internal Medicine

## 2016-05-03 ENCOUNTER — Encounter (HOSPITAL_COMMUNITY): Payer: Self-pay | Admitting: Family Medicine

## 2016-05-03 DIAGNOSIS — J441 Chronic obstructive pulmonary disease with (acute) exacerbation: Secondary | ICD-10-CM | POA: Diagnosis present

## 2016-05-03 DIAGNOSIS — E876 Hypokalemia: Secondary | ICD-10-CM | POA: Diagnosis present

## 2016-05-03 LAB — CBC
HCT: 33.2 % — ABNORMAL LOW (ref 36.0–46.0)
Hemoglobin: 10.7 g/dL — ABNORMAL LOW (ref 12.0–15.0)
MCH: 30.1 pg (ref 26.0–34.0)
MCHC: 32.2 g/dL (ref 30.0–36.0)
MCV: 93.5 fL (ref 78.0–100.0)
Platelets: 175 10*3/uL (ref 150–400)
RBC: 3.55 MIL/uL — AB (ref 3.87–5.11)
RDW: 15.7 % — ABNORMAL HIGH (ref 11.5–15.5)
WBC: 15.6 10*3/uL — AB (ref 4.0–10.5)

## 2016-05-03 LAB — PROCALCITONIN: PROCALCITONIN: 0.69 ng/mL

## 2016-05-03 LAB — RESPIRATORY PANEL BY PCR
Adenovirus: NOT DETECTED
BORDETELLA PERTUSSIS-RVPCR: NOT DETECTED
CHLAMYDOPHILA PNEUMONIAE-RVPPCR: NOT DETECTED
CORONAVIRUS HKU1-RVPPCR: NOT DETECTED
Coronavirus 229E: NOT DETECTED
Coronavirus NL63: NOT DETECTED
Coronavirus OC43: NOT DETECTED
Influenza A: NOT DETECTED
Influenza B: NOT DETECTED
Metapneumovirus: NOT DETECTED
Mycoplasma pneumoniae: NOT DETECTED
PARAINFLUENZA VIRUS 2-RVPPCR: NOT DETECTED
Parainfluenza Virus 1: NOT DETECTED
Parainfluenza Virus 3: NOT DETECTED
Parainfluenza Virus 4: NOT DETECTED
Respiratory Syncytial Virus: NOT DETECTED
Rhinovirus / Enterovirus: NOT DETECTED

## 2016-05-03 LAB — CBG MONITORING, ED: Glucose-Capillary: 166 mg/dL — ABNORMAL HIGH (ref 65–99)

## 2016-05-03 LAB — INFLUENZA PANEL BY PCR (TYPE A & B)
INFLAPCR: NEGATIVE
INFLBPCR: NEGATIVE

## 2016-05-03 LAB — CK TOTAL AND CKMB (NOT AT ARMC)
CK, MB: 11.6 ng/mL — ABNORMAL HIGH (ref 0.5–5.0)
Relative Index: 2.1 (ref 0.0–2.5)
Total CK: 552 U/L — ABNORMAL HIGH (ref 38–234)

## 2016-05-03 LAB — GLUCOSE, CAPILLARY
GLUCOSE-CAPILLARY: 285 mg/dL — AB (ref 65–99)
GLUCOSE-CAPILLARY: 317 mg/dL — AB (ref 65–99)
GLUCOSE-CAPILLARY: 296 mg/dL — AB (ref 65–99)
Glucose-Capillary: 328 mg/dL — ABNORMAL HIGH (ref 65–99)

## 2016-05-03 LAB — MAGNESIUM: MAGNESIUM: 2.4 mg/dL (ref 1.7–2.4)

## 2016-05-03 LAB — MRSA PCR SCREENING: MRSA by PCR: NEGATIVE

## 2016-05-03 LAB — BRAIN NATRIURETIC PEPTIDE: B NATRIURETIC PEPTIDE 5: 2093.8 pg/mL — AB (ref 0.0–100.0)

## 2016-05-03 LAB — TROPONIN I: Troponin I: 0.6 ng/mL (ref <0.03)

## 2016-05-03 MED ORDER — INSULIN GLARGINE 100 UNIT/ML ~~LOC~~ SOLN
30.0000 [IU] | Freq: Every day | SUBCUTANEOUS | Status: DC
  Administered 2016-05-03 – 2016-05-04 (×2): 30 [IU] via SUBCUTANEOUS
  Filled 2016-05-03 (×3): qty 0.3

## 2016-05-03 MED ORDER — FUROSEMIDE 10 MG/ML IJ SOLN
INTRAMUSCULAR | Status: AC
  Filled 2016-05-03: qty 8

## 2016-05-03 MED ORDER — ALLOPURINOL 100 MG PO TABS: 100.0000 mg | ORAL_TABLET | Freq: Every evening | ORAL | Status: DC

## 2016-05-03 MED ORDER — GUAIFENESIN-DM 100-10 MG/5ML PO SYRP
5.0000 mL | ORAL_SOLUTION | ORAL | Status: DC | PRN
  Administered 2016-05-03: 5 mL via ORAL
  Filled 2016-05-03 (×2): qty 5

## 2016-05-03 MED ORDER — ALLOPURINOL 300 MG PO TABS: 300.0000 mg | ORAL_TABLET | Freq: Every evening | ORAL | Status: DC

## 2016-05-03 MED ORDER — LORATADINE 10 MG PO TABS: 10.0000 mg | ORAL_TABLET | Freq: Every day | ORAL | Status: DC | PRN

## 2016-05-03 MED ORDER — ALBUTEROL SULFATE (2.5 MG/3ML) 0.083% IN NEBU: 2.5000 mg | INHALATION_SOLUTION | RESPIRATORY_TRACT | Status: DC | PRN

## 2016-05-03 MED ORDER — FUROSEMIDE 10 MG/ML IJ SOLN: 80.0000 mg | Freq: Once | INTRAMUSCULAR | Status: AC

## 2016-05-03 MED ORDER — LEVOTHYROXINE SODIUM 25 MCG PO TABS
25.0000 ug | ORAL_TABLET | Freq: Every day | ORAL | Status: DC
  Administered 2016-05-03 – 2016-05-14 (×10): 25 ug via ORAL
  Filled 2016-05-03 (×12): qty 1

## 2016-05-03 MED ORDER — GABAPENTIN 400 MG PO CAPS
400.0000 mg | ORAL_CAPSULE | Freq: Two times a day (BID) | ORAL | Status: DC
  Administered 2016-05-03 – 2016-05-05 (×5): 400 mg via ORAL
  Filled 2016-05-03 (×5): qty 1

## 2016-05-03 MED ORDER — AMLODIPINE BESYLATE 10 MG PO TABS
10.0000 mg | ORAL_TABLET | Freq: Every day | ORAL | Status: DC
  Administered 2016-05-03 – 2016-05-07 (×4): 10 mg via ORAL
  Filled 2016-05-03 (×6): qty 1

## 2016-05-03 MED ORDER — METOPROLOL SUCCINATE ER 50 MG PO TB24
150.0000 mg | ORAL_TABLET | Freq: Every day | ORAL | Status: DC
  Administered 2016-05-03 – 2016-05-04 (×2): 150 mg via ORAL
  Filled 2016-05-03 (×3): qty 1

## 2016-05-03 MED ORDER — CLOPIDOGREL BISULFATE 75 MG PO TABS
75.0000 mg | ORAL_TABLET | Freq: Every day | ORAL | Status: DC
  Administered 2016-05-03 – 2016-05-13 (×10): 75 mg via ORAL
  Filled 2016-05-03 (×12): qty 1

## 2016-05-03 MED ORDER — ACETAMINOPHEN 650 MG RE SUPP: 650.0000 mg | Freq: Four times a day (QID) | RECTAL | Status: DC | PRN

## 2016-05-03 MED ORDER — HYDROXYZINE HCL 25 MG PO TABS
25.0000 mg | ORAL_TABLET | Freq: Three times a day (TID) | ORAL | Status: DC | PRN
  Administered 2016-05-07: 25 mg via ORAL
  Filled 2016-05-03 (×2): qty 1

## 2016-05-03 MED ORDER — METOPROLOL TARTRATE 5 MG/5ML IV SOLN: 5.0000 mg | Freq: Once | INTRAVENOUS | Status: DC

## 2016-05-03 MED ORDER — NITROGLYCERIN 0.4 MG SL SUBL
SUBLINGUAL_TABLET | SUBLINGUAL | Status: AC
  Filled 2016-05-03: qty 1

## 2016-05-03 MED ORDER — FUROSEMIDE 10 MG/ML IJ SOLN
60.0000 mg | Freq: Two times a day (BID) | INTRAMUSCULAR | Status: DC
  Administered 2016-05-03 (×2): 60 mg via INTRAVENOUS
  Administered 2016-05-03: 18:00:00 via INTRAVENOUS
  Administered 2016-05-03: 60 mg via INTRAVENOUS
  Filled 2016-05-03 (×4): qty 6

## 2016-05-03 MED ORDER — NITROGLYCERIN 0.4 MG SL SUBL
SUBLINGUAL_TABLET | SUBLINGUAL | Status: AC
  Administered 2016-05-03: 18:00:00
  Filled 2016-05-03: qty 2

## 2016-05-03 MED ORDER — ASPIRIN 81 MG PO CHEW
81.0000 mg | CHEWABLE_TABLET | Freq: Every day | ORAL | Status: DC
  Administered 2016-05-03 – 2016-05-14 (×11): 81 mg via ORAL
  Filled 2016-05-03 (×11): qty 1

## 2016-05-03 MED ORDER — ISOSORBIDE MONONITRATE ER 60 MG PO TB24
120.0000 mg | ORAL_TABLET | Freq: Every day | ORAL | Status: DC
  Administered 2016-05-03 – 2016-05-04 (×2): 120 mg via ORAL
  Filled 2016-05-03 (×3): qty 2

## 2016-05-03 MED ORDER — INSULIN ASPART 100 UNIT/ML ~~LOC~~ SOLN
0.0000 [IU] | Freq: Every day | SUBCUTANEOUS | Status: DC
  Administered 2016-05-03: 4 [IU] via SUBCUTANEOUS

## 2016-05-03 MED ORDER — MOMETASONE FURO-FORMOTEROL FUM 200-5 MCG/ACT IN AERO
2.0000 | INHALATION_SPRAY | Freq: Two times a day (BID) | RESPIRATORY_TRACT | Status: DC
  Administered 2016-05-03 (×2): 2 via RESPIRATORY_TRACT
  Filled 2016-05-03: qty 8.8

## 2016-05-03 MED ORDER — PREDNISONE 20 MG PO TABS
40.0000 mg | ORAL_TABLET | Freq: Every day | ORAL | Status: DC
  Administered 2016-05-03: 40 mg via ORAL
  Filled 2016-05-03: qty 2

## 2016-05-03 MED ORDER — CLONIDINE HCL 0.2 MG PO TABS
0.2000 mg | ORAL_TABLET | Freq: Three times a day (TID) | ORAL | Status: DC
  Administered 2016-05-03 – 2016-05-07 (×12): 0.2 mg via ORAL
  Filled 2016-05-03 (×17): qty 1

## 2016-05-03 MED ORDER — AZITHROMYCIN 250 MG PO TABS
250.0000 mg | ORAL_TABLET | Freq: Every day | ORAL | Status: DC
  Administered 2016-05-03: 250 mg via ORAL
  Filled 2016-05-03 (×2): qty 1

## 2016-05-03 MED ORDER — ENOXAPARIN SODIUM 30 MG/0.3ML ~~LOC~~ SOLN
30.0000 mg | SUBCUTANEOUS | Status: DC
  Administered 2016-05-03 – 2016-05-06 (×4): 30 mg via SUBCUTANEOUS
  Filled 2016-05-03 (×4): qty 0.3

## 2016-05-03 MED ORDER — POTASSIUM CHLORIDE CRYS ER 20 MEQ PO TBCR
40.0000 meq | EXTENDED_RELEASE_TABLET | Freq: Two times a day (BID) | ORAL | Status: DC
  Administered 2016-05-03 (×2): 40 meq via ORAL
  Filled 2016-05-03 (×4): qty 2

## 2016-05-03 MED ORDER — ACETAMINOPHEN 325 MG PO TABS
650.0000 mg | ORAL_TABLET | Freq: Four times a day (QID) | ORAL | Status: DC | PRN
  Administered 2016-05-03 (×2): 650 mg via ORAL
  Filled 2016-05-03 (×2): qty 2

## 2016-05-03 MED ORDER — ALBUTEROL SULFATE (2.5 MG/3ML) 0.083% IN NEBU
2.5000 mg | INHALATION_SOLUTION | Freq: Four times a day (QID) | RESPIRATORY_TRACT | Status: DC
  Administered 2016-05-03 (×4): 2.5 mg via RESPIRATORY_TRACT
  Filled 2016-05-03 (×4): qty 3

## 2016-05-03 MED ORDER — INSULIN ASPART 100 UNIT/ML ~~LOC~~ SOLN
0.0000 [IU] | Freq: Three times a day (TID) | SUBCUTANEOUS | Status: DC
  Administered 2016-05-03: 7 [IU] via SUBCUTANEOUS
  Administered 2016-05-03 (×2): 5 [IU] via SUBCUTANEOUS

## 2016-05-03 MED ORDER — ATORVASTATIN CALCIUM 40 MG PO TABS
40.0000 mg | ORAL_TABLET | Freq: Every day | ORAL | Status: DC
  Administered 2016-05-03 – 2016-05-14 (×10): 40 mg via ORAL
  Filled 2016-05-03 (×12): qty 1

## 2016-05-03 MED ORDER — NITROGLYCERIN 0.4 MG SL SUBL
0.4000 mg | SUBLINGUAL_TABLET | SUBLINGUAL | Status: DC | PRN
  Administered 2016-05-03: 0.4 mg via SUBLINGUAL
  Filled 2016-05-03: qty 1

## 2016-05-03 MED ORDER — EZETIMIBE 10 MG PO TABS
10.0000 mg | ORAL_TABLET | Freq: Every day | ORAL | Status: DC
  Administered 2016-05-03 – 2016-05-14 (×11): 10 mg via ORAL
  Filled 2016-05-03 (×12): qty 1

## 2016-05-03 MED ORDER — SODIUM CHLORIDE 0.9% FLUSH
3.0000 mL | Freq: Two times a day (BID) | INTRAVENOUS | Status: DC
  Administered 2016-05-03 – 2016-05-14 (×19): 3 mL via INTRAVENOUS

## 2016-05-03 NOTE — Telephone Encounter (Signed)
ERROR

## 2016-05-03 NOTE — Progress Notes (Signed)
RT entered room to find patient tripoding and veru SOB. Sats 85% on NRB. RT placed pt back on BIPAP with previous settings. Pt recovering well at this time. WOB decreased and sats are 100%. Will cont to monitor

## 2016-05-03 NOTE — Progress Notes (Signed)
Inpatient Diabetes Program Recommendations  AACE/ADA: New Consensus Statement on Inpatient Glycemic Control (2015)  Target Ranges:  Prepandial:   less than 140 mg/dL      Peak postprandial:   less than 180 mg/dL (1-2 hours)      Critically ill patients:  140 - 180 mg/dL   Lab Results  Component Value Date   GLUCAP 285 (H) 05/03/2016   HGBA1C 5.9 03/15/2016    Review of Glycemic Control  Diabetes history:     A1C currently in pre-diabetes range,     DM2,     Obesity,     Stage IV CKD,     Steroids this admission Outpatient Diabetes medications:     Novolog 10-20 units TIDAC    Toujeo 30 units daily Current orders for Inpatient glycemic control:     Novolog 0-9 units TIDAC and 0-5 units QHS Correction SSI    Lantus 30 units QHS    Prednisone 40 mg daily  Inpatient Diabetes Program Recommendations:    Please consider meal coverage of Novolog 10 units TIDAC if patient eats > 50% of meal (same as home dosing).  Thank you,  Windy Carina, RN, MSN Diabetes Coordinator Inpatient Diabetes Program [phone] (Team Pager)

## 2016-05-03 NOTE — Progress Notes (Signed)
Critical value, Trop 0.60, received.  Dr. Donnal Debar notified; no new orders.  Updated re: pt's respiratory status- attempted to place on O2 @ 6L/[place] and 45% Venti Mask per RT but pt unable to tolerate - dropped Sats into high 80s/low 90s.  Pt placed back on BiPAP @ 50%.  MD updated, new orders placed for BiPAP.

## 2016-05-03 NOTE — Progress Notes (Signed)
CRITICAL VALUE ALERT  Critical value received:  2043  Date of notification:  05/03/2016  Time of notification: 2043  Critical value read back:YES  Nurse who received alert: Fabienne Bruns, RN  MD notified (1st page):  Walden Field, MD  Time of first page:  2048  MD notified (2nd page):  Time of second page:  Responding MD:  Walden Field, MD  Time MD responded:  2049

## 2016-05-03 NOTE — ED Notes (Signed)
Admitting at bedside 

## 2016-05-03 NOTE — Procedures (Signed)
Placed pt on Bipap at this time due to SPo2 78%, increased WOB, RN at bedside on phone with MD, pt tolerating Bipap well at this time, RT will monitor

## 2016-05-03 NOTE — Progress Notes (Signed)
Patient trialed off BiPap per patient request.  After about 5 minutes sats dropped to 85% on 45% VM.  Patient has mild SOB, bilateral expiratory wheezes and fine crackles.  Patient placed back on BiPap 14/7 with 40% Fi02.  RN and MD notified.  Will continue to monitor.

## 2016-05-03 NOTE — Progress Notes (Signed)
Pt complained of chest pain after nebulizer due to her coughing. Gave tylenol with no effect. Pt stated pain still the same 6/10. Did EKG, notified MD and asked for sublingual nitro. Nitro gave some relief bu not a lot. Paged MD again and asked for lasix as pt sounded wet and her rate of breathing was increasing and her sats were decreasing on 6 liters of oxygen.[name] Heart rate was 117 and BP was 195/78. Placed back on BIPAP and foley inserted with 80mg  of IV lasix given. Pt now pain free and BP down to 153/78 heart rate now 85 and Sats 96%. Vola Beneke RN 05/03/2016 @1730 

## 2016-05-03 NOTE — H&P (Signed)
History and Physical  Patient Name: Brooke Stafford     CWC:[id number]    DOB: Mar 19, 1956    DOA: 05/02/2016 PCP: No primary care provider on file.   Patient coming from: Home via EMS  Chief Complaint: SOB, productive cough  HPI: Brooke Stafford is a 61 y.o. female with a past medical history significant for CAD s/p CABG 2002, CHF EF 40-45%, hx CVA with R sided weakness (mild), CKD V not yet on HD, hypothyroidism, anemia and IDDM who presents with acute hypoxic respiratory failure.  The patient initially developed URI symptoms of sore throat, hoarseness, runny nose about a week ago. Then the last 24-48 hours, she has had worsening cough, sputum production, and then today she became very dyspneic to she called 9-1-1.  Per report, EMS found her hypoxic to 60% on room air, in respiratory distress, so they administered CPAP, Solu-Medrol, magnesium, and a DuoNeb and thought her to the ER.  ED course: -Febrile, heart rate 86, respirations 28, saturation mid 90s on BiPAP, blood pressure 162/84 -Na 143, K 3.4, Cr 3.92 (baseline 4s since Dec, >2 since 2016), WBC 7.9K, Hgb 11.9 -Troponin negative -Chest x-ray showed bilateral opacities consistent with edema, question left base opacity atelectases versus pneumonia -She was significantly improved on DuoNeb, Solu-Medrol and BiPAP -She was given ceftriaxone and azithromycin and TRH were asked to evaluate for COPD flare and pneumonia     Of note, the patient has no previous diagnosis of COPD, does not see a pulmonologist.  Smoking history, quit many years ago.  She took no inhalers at home until her admission 1 month ago (for acute hypox res failure) during which she was diagnosed with both CHF flare and COPD flare, diuresed, treated with steroids/BDs and discharged on new Advair.  Since that admission, she had PFTs that showed FEV1/FVC 78% pred, but both FEV1 and FVC ~50% predicted.     ROS: Review of Systems  Constitutional: Negative for chills,  diaphoresis and fever (denied to me).  HENT: Positive for congestion and sore throat.   Respiratory: Positive for cough, sputum production, shortness of breath and wheezing. Negative for hemoptysis.   All other systems reviewed and are negative.         Past Medical History:  Diagnosis Date  . Anemia   . Anginal pain ([place])   . Anxiety   . Arthritis    "stiff fingers and knees" (08/04/2013), (12/12/2014)  . Asthma   . CAD (coronary artery disease) 2002; 2015   CABG x 6 2002, cath 2011- med Rx stent DES VG-Diag  . CAD (coronary artery disease) of artery bypass graft; DES to VG-Diag 09/28/13 11/09/2013  . Cataract   . CHF (congestive heart failure) ([place])    "in 2002" (11/26/2012)  . Chronic bronchitis ([place])    "q year; in the winter"   . CKD (chronic kidney disease)    stage 4, followed by [facility]  . Coronary artery disease 2002   CABG x 6. Cath 5/11- med Rx  . GERD (gastroesophageal reflux disease)   . Gout    "right big toe"  . Headache    "~ q week" (08/04/2013); "~ twice/month" (12/12/2014)  . History of blood transfusion 2002   "when I had OHS"  . Hyperlipidemia   . Hypertension   . Hypothyroid    treated  . Migraines    "couple times/year" (08/04/2013), (12/12/2014)  . Myocardial infarction 2000; 2002; 2011  . Obesity (BMI 35.0-39.9 without comorbidity)   .  Peripheral vascular disease ([place]) 12/12   LSFA PTA  . Pneumonia    "3 times I think" (12/12/2014)  . Type II diabetes mellitus ([place])     Past Surgical History:  Procedure Laterality Date  . ABDOMINAL AORTAGRAM N/A 04/05/2011   Procedure: ABDOMINAL AORTAGRAM;  Surgeon: Lorretta Harp, MD;  Location: [facility] CATH LAB;  Service: Cardiovascular;  Laterality: N/A;  . APPENDECTOMY  1980  . BREAST CYST EXCISION Right 1970's  . CARDIAC CATHETERIZATION  2002  . CARDIAC CATHETERIZATION N/A 12/12/2014   Procedure: Left Heart Cath and Cors/Grafts Angiography;  Surgeon: Peter M Martinique, MD;  Location: [facility] CV LAB;   Service: Cardiovascular;  Laterality: N/A;  . CARDIAC CATHETERIZATION N/A 12/12/2014   Procedure: Coronary Stent Intervention;  Surgeon: Peter M Martinique, MD;  Location: [place] CV LAB;  Service: Cardiovascular;  Laterality: N/A;  . [place]; 1980  . CHOLECYSTECTOMY  1982  . CORONARY ANGIOPLASTY WITH STENT PLACEMENT  2004; 2012   "I have 2 stents" (08/04/2013)  . CORONARY ANGIOPLASTY WITH STENT PLACEMENT  09/28/13   PTCA/ DES Xience stent to VG-Diag   . CORONARY ARTERY BYPASS GRAFT  11/20/2000   x6 LIMA to distal LAD, svg to first diag, svg to ramus intermediate branch and swquential SVG to cir marginal branch, SVG to posterior descending coronary and sequential SVG to first right posterolateral branch  . LEFT HEART CATHETERIZATION WITH CORONARY/GRAFT ANGIOGRAM N/A 09/28/2013   Procedure: LEFT HEART CATHETERIZATION WITH Beatrix Fetters;  Surgeon: Peter M Martinique, MD;  Location[facility] CATH LAB;  Service: Cardiovascular;  Laterality: N/A;  . LOWER EXTREMITY ANGIOGRAM  12/01/2012   Procedure: LOWER EXTREMITY ANGIOGRAM;  Surgeon: Lorretta Harp, MD;  Location: [facility] CATH LAB;  Service: Cardiovascular;;  . NM MYOCAR PERF WALL MOTION  08/27/2004   negative  . PERCUTANEOUS STENT INTERVENTION Left 12/01/2012   Procedure: PERCUTANEOUS STENT INTERVENTION;  Surgeon: Lorretta Harp, MD;  Location: [facility] CATH LAB;  Service: Cardiovascular;  Laterality: Left;  Left SFA  . PERIPHERAL ARTERIAL STENT GRAFT Left    SFA/notes 04/07/2011 (11/30/2012)  . RENAL ANGIOGRAM N/A 04/05/2011   Procedure: RENAL ANGIOGRAM;  Surgeon: Lorretta Harp, MD;  Location: [facility] CATH LAB;  Service: Cardiovascular;  Laterality: N/A;  . TUBAL LIGATION  1980    Social History: Patient lives with her roommate.  The patient walks with a cane.  She is able to ambulate around the grocery store for example withotu difficulty.  Quit smoking 10-15 years ago.  Allergies  Allergen Reactions  . Digoxin And Related Other (See  Comments)    Patient stated she almost died. Had flu like symptoms as well as diarrhea.  . Hydralazine Shortness Of Breath  . Lisinopril Other (See Comments)    Felt like she had the flu. Was very sick!!!  . Penicillins Cross Reactors Hives    And high fever Has patient had a PCN reaction causing immediate rash, facial/tongue/throat swelling, SOB or lightheadedness with hypotension: No Has patient had a PCN reaction causing severe rash involving mucus membranes or skin necrosis: Yes Has patient had a PCN reaction that required hospitalization: Unk Has patient had a PCN reaction occurring within the last 10 years: Unk If all of the above answers are "NO", then may proceed with Cephalosporin use.   . Adhesive [Tape] Rash    bruising    Family history: family history includes Diabetes in her mother and sister; Hyperlipidemia in her sister; Hypertension in her brother, father,  mother, and sister; Stroke in her mother.  Prior to Admission medications   Medication Sig Start Date End Date Taking? Authorizing Provider  acetaminophen-codeine (TYLENOL #3) 300-30 MG tablet Take 1 tablet by mouth every 4 (four) hours as needed for moderate pain. 01/11/16   Tresa Garter, MD  albuterol (PROVENTIL HFA;VENTOLIN HFA) 108 (90 Base) MCG/ACT inhaler Inhale 2 puffs into the lungs every 6 (six) hours as needed for wheezing or shortness of breath. 09/28/15   Tresa Garter, MD  albuterol (PROVENTIL) (2.5 MG/3ML) 0.083% nebulizer solution Take 3 mLs (2.5 mg total) by nebulization every 6 (six) hours as needed for wheezing or shortness of breath. 04/03/16   Tresa Garter, MD  allopurinol (ZYLOPRIM) 300 MG tablet Take 1 tablet (300 mg total) by mouth daily. Patient taking differently: Take 300 mg by mouth every evening.  02/22/16   Tresa Garter, MD  amLODipine (NORVASC) 10 MG tablet take 1 tablet by mouth once daily Patient taking differently: Take 10 mg by mouth daily 11/20/15   Sanda Klein,  MD  aspirin 81 MG chewable tablet Chew 1 tablet (81 mg total) by mouth daily. 02/24/15   Mihai Croitoru, MD  atorvastatin (LIPITOR) 40 MG tablet Take 1 tablet (40 mg total) by mouth daily. 04/26/16   Arnoldo Morale, MD  B-D UF III MINI PEN NEEDLES 31G X 5 MM MISC USE TO INJECT INSULIN 4 TIMES A DAY AS INSTRUCTED 04/04/16   Philemon Kingdom, MD  cloNIDine (CATAPRES) 0.2 MG tablet Take 1 tablet (0.2 mg total) by mouth 3 (three) times daily. 02/22/16   Tresa Garter, MD  clopidogrel (PLAVIX) 75 MG tablet Take 1 tablet (75 mg total) by mouth daily. 09/25/15   Mihai Croitoru, MD  ezetimibe (ZETIA) 10 MG tablet Take 1 tablet (10 mg total) by mouth daily. 01/11/16   Tresa Garter, MD  fluticasone (FLONASE) 50 MCG/ACT nasal spray Place 2 sprays into both nostrils daily as needed for allergies or rhinitis. 09/28/15   Tresa Garter, MD  Fluticasone-Salmeterol (ADVAIR DISKUS) 250-50 MCG/DOSE AEPB Inhale 1 puff into the lungs 2 (two) times daily. 04/26/16   Arnoldo Morale, MD  furosemide (LASIX) 20 MG tablet Take a total of 60 mg in the morning and 40 mg at night. Patient taking differently: Take 80 mg by mouth daily.  03/21/16   Tresa Garter, MD  gabapentin (NEURONTIN) 400 MG capsule take 1 capsule by mouth three times a day Patient taking differently: Take 400 mg by mouth twice daily 01/24/16   Tresa Garter, MD  hydrOXYzine (ATARAX/VISTARIL) 25 MG tablet Take 1 tablet (25 mg total) by mouth 3 (three) times daily. 03/21/16   Tresa Garter, MD  insulin aspart (NOVOLOG FLEXPEN) 100 UNIT/ML FlexPen Inject 10-30 units into the skin 3 times daily, plus sliding scale as instructed. Patient taking differently: Inject 10-20 Units into the skin 3 (three) times daily with meals. Inject 10-30 units into the skin 3 times daily, plus sliding scale as instructed. 12/14/15   Philemon Kingdom, MD  Insulin Glargine (TOUJEO SOLOSTAR) 300 UNIT/ML SOPN Inject 35 Units into the skin at bedtime. Patient  taking differently: Inject 30 Units into the skin at bedtime.  03/15/16   Philemon Kingdom, MD  isosorbide mononitrate (IMDUR) 60 MG 24 hr tablet Take 2 tablets (120 mg total) by mouth daily. 02/22/16   Tresa Garter, MD  levothyroxine (SYNTHROID, LEVOTHROID) 25 MCG tablet take 1 tablet by mouth every morning BEFORE BREAKFAST Patient taking  differently: TAKE 25 MCG BY MOUTH EACH MORNING 03/26/16   Tresa Garter, MD  loratadine (CLARITIN) 10 MG tablet Take 1 tablet (10 mg total) by mouth daily as needed for allergies. 12/15/14   Erlene Quan, PA-C  magnesium citrate SOLN Take 296 mLs (1 Bottle total) by mouth as needed for severe constipation. 07/28/15   Ripudeep Krystal Eaton, MD  metoprolol succinate (TOPROL-XL) 50 MG 24 hr tablet Take 3 tablets (150 mg total) by mouth daily. Take with or immediately following a meal. 03/08/16   Rhonda G Barrett, PA-C  Naphazoline HCl (CLEAR EYES OP) Place 1 drop into both eyes as needed (for dry eyes).    Historical Provider, MD  nitroGLYCERIN (NITROSTAT) 0.4 MG SL tablet Place 1 tablet (0.4 mg total) under the tongue every 5 (five) minutes as needed. 04/11/16   Arnoldo Morale, MD  prednisoLONE acetate (PRED FORTE) 1 % ophthalmic suspension Place 1 drop into both eyes 3 (three) times daily. 03/25/16   Historical Provider, MD  ranitidine (ZANTAC 75) 75 MG tablet Take 1 tablet (75 mg total) by mouth 2 (two) times daily. Patient taking differently: Take 75 mg by mouth 2 (two) times daily as needed for heartburn.  07/28/15   Ripudeep Krystal Eaton, MD  senna-docusate (SENOKOT-S) 8.6-50 MG tablet Take 1 tablet by mouth at bedtime as needed for mild constipation. 12/23/15   Virgel Manifold, MD  simethicone (MYLICON) 643 MG chewable tablet Chew 125 mg by mouth every 6 (six) hours as needed for flatulence.    Historical Provider, MD  traZODone (DESYREL) 50 MG tablet Take 1 tablet (50 mg total) by mouth at bedtime as needed for sleep. Patient not taking: Reported on 04/26/2016 03/21/16    Tresa Garter, MD  triamcinolone ointment (KENALOG) 0.5 % Apply 1 application topically 2 (two) times daily. Patient taking differently: Apply 1 application topically daily as needed (for skin).  08/24/15   Tresa Garter, MD       Physical Exam: BP 152/82   Pulse 73   Temp (!) 96.9 F (36.1 C) (Axillary)   Resp 22   Ht 5\' 10"  (1.778 m)   Wt 104.3 kg (230 lb)   LMP 10/13/2014 (Exact Date)   SpO2 99%   BMI 33.00 kg/m  General appearance: Well-developed, obese adult female, alert and in mild distress from dyspnea, on NRB now.   Eyes: Anicteric, conjunctiva pink, lids and lashes normal. PERRL.    ENT: No nasal deformity, discharge, epistaxis.  Hearing normal. OP dry without lesions.   Neck: No neck masses.  Trachea midline.  No thyromegaly/tenderness. Lymph: No cervical or supraclavicular lymphadenopathy. Skin: Warm and dry.  No jaundice.  No suspicious rashes or lesions. Cardiac: RRR, nl S1-S2, no murmurs appreciated.  Capillary refill is brisk.  No JVD.  Faint pitting on RLE, none on left, chronic.  Radial and DP pulses 2+ and symmetric. Respiratory: Tachypnea, on NRB.  Coarse breath sounds with expiration.  No wheezes.  No focal rales. Abdomen: Abdomen soft.  No TTP. No ascites, distension, hepatosplenomegaly.   MSK: No deformities or effusions.  No cyanosis or clubbing. Neuro: Cranial nerves 3-12 intact.  Sensation intact to light touch. Speech is fluent.  Muscle strength normal and symmetric.    Psych: Sensorium intact and responding to questions, attention normal.  Behavior appropriate.  Affect normal.  Judgment and insight appear normal.     Labs on Admission:  I have personally reviewed following labs and imaging studies: CBC:  Recent  Labs Lab 05/02/16 2145 05/02/16 2152  WBC 7.9  --   NEUTROABS 4.5  --   HGB 11.9* 12.6  HCT 35.8* 37.0  MCV 93.2  --   PLT 184  --    Basic Metabolic Panel:  Recent Labs Lab 05/02/16 2145 05/02/16 2152  NA 143 145    K 3.4* 3.4*  CL 107 110  CO2 19*  --   GLUCOSE 190* 187*  BUN 35* 35*  CREATININE 3.92* 4.10*  CALCIUM 9.8  --    GFR: Estimated Creatinine Clearance: 18.8 mL/min (by C-G formula based on SCr of 4.1 mg/dL (H)).  Liver Function Tests: No results for input(s): AST, ALT, ALKPHOS, BILITOT, PROT, ALBUMIN in the last 168 hours. No results for input(s): LIPASE, AMYLASE in the last 168 hours. No results for input(s): AMMONIA in the last 168 hours. Coagulation Profile: No results for input(s): INR, PROTIME in the last 168 hours. Cardiac Enzymes: No results for input(s): CKTOTAL, CKMB, CKMBINDEX, TROPONINI in the last 168 hours. BNP (last 3 results) No results for input(s): PROBNP in the last 8760 hours. HbA1C: No results for input(s): HGBA1C in the last 72 hours. CBG:  Recent Labs Lab 05/03/16 0007  GLUCAP 166*   Lipid Profile: No results for input(s): CHOL, HDL, LDLCALC, TRIG, CHOLHDL, LDLDIRECT in the last 72 hours. Thyroid Function Tests: No results for input(s): TSH, T4TOTAL, FREET4, T3FREE, THYROIDAB in the last 72 hours. Anemia Panel: No results for input(s): VITAMINB12, FOLATE, FERRITIN, TIBC, IRON, RETICCTPCT in the last 72 hours. Sepsis Labs: Invalid input(s): PROCALCITONIN, LACTICIDVEN No results found for this or any previous visit (from the past 240 hour(s)).       Radiological Exams on Admission: Personally reviewed CXR shows bilateral opacities, suggests edema, also pleural effusions mild: Dg Chest Portable 1 View  Result Date: 05/02/2016 CLINICAL DATA:  Shortness of breath and cough for 1 day. Hypoxia. Decreased air movement. EXAM: PORTABLE CHEST 1 VIEW COMPARISON:  Radiographs 04/01/2016 FINDINGS: Patient is post median sternotomy and CABG. Cardiomegaly appears increased. Peribronchial cuffing and diffuse interstitial opacities consistent with pulmonary edema. Hazy bilateral lower lobe opacities likely combination of pleural effusion and airspace disease. No  pneumothorax. Unchanged osseous structures. IMPRESSION: 1. Pulmonary edema, pleural effusions and cardiomegaly, suspicious for CHF. 2. Hazy lower lobe opacities are likely a combination of pleural effusions and airspace disease, which may be atelectasis or pneumonia. Electronically Signed   By: Jeb Levering M.D.   On: 05/02/2016 22:11    EKG: Independently reviewed. Rate 87, QTc 484, lateral TW changes, old.  No ST changes.  Echocardiogram Sep 2017: EF 40-45% Grade I DD Mild MR    Assessment/Plan Principal Problem:   Acute respiratory failure with hypoxia [facility]) Active Problems:   CAD -S/P PCI June 2015 and 12/14/14   Hypothyroidism, acquired   CKD (chronic kidney disease) stage 5, GFR less than 15 ml/min (HCC)   Acute combined systolic and diastolic heart failure ([place])   Essential hypertension   Diabetes mellitus with circulatory complication, with long-term current use of insulin (HCC)   Anemia of chronic disease   Hypokalemia  1. Acute hypoxic respiratory failure from CHF flare and COPD flare:    CHF: Acute on chronic systolic and diastolic, EF 02-40%. -Furosemide 60 mg IV BID -Supplement K -Strict I/Os, daily weights -Close monitoring of renal function  COPD: New diagnosis. -Prednisone 40 mg daily -Azithromycin for 5 days -Albuterol scheduled and PRN -Admit to Stepdown for possible BiPAP again -Continue Advair as formulary  alternative and loratadine -Check procalcitonin, doubt pneumonia clinically (did have elevated procalc last admission and got Levaquin) -Obtain flu panel and RVP; droplet precautions -Robitussin PRN   2. Hypertension and coronary disease secondary prevention:  -Continue Imdur, BB, amlodipine, clonidine -Continue Zetia, statin, aspirin -Continue Plavix  3. CKD V baseline Cr now around 4:  At baseline -Daily BMP  4. Insulin dependent diabetes:  -Continue home glargine -SSI with meals  5. Anemia of renal disease:  At baseline  6.  Hypokalemia:  Mild. -Check magnesium  7. Hypothyroidism: -Continue levothyroxine  8. Other medications:  -Continue allopurinol, decrease dose for renal function -Continue gabapentin for neuropathy -Continue hydroxyzine for anxiety     DVT prophylaxis: Lovenox, low dose  Code Status: FULL  Family Communication: Roommate at bedside  Disposition Plan: Anticipate IV diuresis and prednisone and bronchodilators and azithromycin for treatment of COPD flare and CHF Consults called: None Admission status: INPATIENT       Medical decision making: Patient seen at 11:45 AM on 05/02/2016.  The patient was discussed with Dr. Asher Muir.  What exists of the patient's chart was reviewed in depth and summarized above.  Clinical condition: improving but still high risk fo requiring NIV again.        Edwin Dada [facility] Pager [phone]      At the time of admission, it appears that the appropriate admission status for this patient is INPATIENT. This is judged to be reasonable and necessary in order to provide the required intensity of service to ensure the patient's safety given the presenting symptoms, physical exam findings, and initial radiographic and laboratory data in the context of their chronic comorbidities.  Together, these circumstances are felt to place her at high risk for further clinical deterioration threatening life, limb, or organ.   Patient requires inpatient status due to high intensity of service, high risk for further deterioration and high frequency of surveillance required because of this severe exacerbation of their chronic organ failure.  Factors that support inpatient status include: Hypoxic respiratory failure with initial presenting SpO2 60% and respirations fast and labored CXR with bilateral opacities Hypokalemia Chronic renal failure with baseline creatinine >3 Chronic diastolic and systolic CHF COPD Anemia Coronary artery disease  with history of CABG Insulin dependent diabetes Hypertension  I certify that at the point of admission it is my clinical judgment that the patient will require inpatient hospital care spanning beyond 2 midnights from the point of admission and that early discharge would result in unnecessary risk of decompensation and readmission or threat to life, limb or bodily function.

## 2016-05-03 NOTE — Progress Notes (Signed)
Patient arrived on unit from ED on non-rebreather. Once in room patient immediately asked for Bipap for increased WOB. RT applied Bipap and patient's breathing settled down. Patient now resting. Will continue to monitor.

## 2016-05-03 NOTE — Telephone Encounter (Signed)
Medical social worker from [facility] calling for verbal orders  She was to see the patient for a final visit this week, however due to the weather she was unable to. Requesting an order for one visit next week     Caller stated Dr. Doreene Burke, not sure if this should go to Dr. Jarold Song instead?

## 2016-05-03 NOTE — Hospital Discharge Follow-Up (Signed)
[facility]itional [facility] at [place]:  Patient known to the [facility] at [place]. This Case Manager met with patient at bedside to determine plans for primary care post-discharge follow-up. Patient agreeable to follow-up with the Transitional Care Clinic after discharge, and appointment scheduled for 05/08/16 at 1400 with Dr. Jarold Song. AVS updated. Patient also indicated she would like to resume [place], PT, SW services with [facility] after discharge. Elissa Hefty, RN CM updated.  Patient informed this Case Manager she is still looking for housing because of mold in her current residence. Patient indicated she recently went to the [employer] and was given a list of apartments. She indicated she has visited several apartment complexes and many have a year long waiting list. She indicated she plans to continue her housing search once her condition improves. Patient indicated she would like to meet with an inpatient Social Worker to determine if there are any additional housing resources. Elissa Hefty, RN CM updated, and she indicated she would put in an order for a Social Work consult. Will continue to follow patient's medical progress closely.

## 2016-05-03 NOTE — Care Management Note (Signed)
Case Management Note  Patient Details  Name: Brooke Stafford MRN: [id number] Date of Birth: [birth year]  Subjective/Objective:      Adm w resp failure, on bipap              Action/Plan:lives w roommate   Expected Discharge Date:                  Expected Discharge Plan:  [place]  In-House Referral:     Discharge planning Services     Post Acute Care Choice:    Choice offered to:     DME Arranged:    DME Agency:     HH Arranged:    [place] Agency:     Status of Service:  In process, will continue to follow  If discussed at Long Length of Stay Meetings, dates discussed:    Additional Comments:will moniter for dc needs as pt progresses  Lacretia Leigh, RN 05/03/2016, 8:02 AM

## 2016-05-03 NOTE — Progress Notes (Signed)
PROGRESS NOTE    Brooke Stafford  JQZ:[id number] DOB: 03/25/1956 DOA: 05/02/2016 PCP: No primary care provider on file.  Brief Narrative:   61 y.o. female with a past medical history significant for CAD s/p CABG 2002, CHF EF 40-45%, hx CVA with R sided weakness (mild), CKD V not yet on HD, hypothyroidism, anemia and IDDM who presents with acute hypoxic respiratory failure.  The patient initially developed URI symptoms of sore throat, hoarseness, runny nose about a week ago. Then the last 24-48 hours, she has had worsening cough, sputum production   Assessment & Plan:   Principal Problem:   Acute respiratory failure with hypoxia ([place]) - Most likely secondary to combination of etiologies suspecting COPD and CHF flare. Continue Lasix, strict I's and O's, daily weights. - Otherwise continue prednisone, azithromycin, albuterol, BiPAP as needed, Advair or its alternative while in house, and Robitussin - Pt on lasix 60 mg IV BID, not good urinary output per my discussion with nurse. Will administer another 80 mg IV.  Active Problems:   CAD -S/P PCI June 2015 and 12/14/14 - History of angina per patient will continue home nitroglycerin dose for when necessary chest pain. Otherwise continue aspirin and statin    Hypothyroidism, acquired - Continue Synthroid    CKD (chronic kidney disease) stage 5, GFR less than 15 ml/min (HCC)  - Serum creatinine near baseline around 4    Acute combined systolic and diastolic heart failure ([place]) - As listed above. Please see plan above    Essential hypertension - Continue Imdur, BB, amlodipine, clonidine    Diabetes mellitus with circulatory complication, with long-term current use of insulin (HCC) -Continue home glargine -SSI with meals    Anemia of chronic disease -Stable currently    Hypokalemia - Stable we'll reassess next a.m.    COPD exacerbation (HCC)  DVT prophylaxis: Lovenox Code Status: Full Family Communication: d/c  patient Disposition Plan: pending improvement in respiratory condition   Consultants:   None   Procedures: None   Antimicrobials: Azithromycin   Subjective: Pt has no new complaints. No acute issues overnight.  Objective: Vitals:   05/03/16 0818 05/03/16 1144 05/03/16 1454 05/03/16 1550  BP:  (!) 158/79  (!) 162/82  Pulse:    99  Resp:  15  19  Temp:  99.1 F (37.3 C)  98.5 F (36.9 C)  TempSrc:  Oral  Oral  SpO2: 95% 99% 94% 91%  Weight:      Height:        Intake/Output Summary (Last 24 hours) at 05/03/16 1708 Last data filed at 05/03/16 0119  Gross per 24 hour  Intake              300 ml  Output                0 ml  Net              300 ml   Filed Weights   05/02/16 2138 05/03/16 0404  Weight: 104.3 kg (230 lb) 110.8 kg (244 lb 4.3 oz)    Examination:  General exam: Appears calm and comfortable, in nad. Respiratory system: decreased breath sounds at bases, no wheezes, equal chest rise. Cardiovascular system: S1 & S2 heard, RRR.  Gastrointestinal system: Abdomen is nondistended, soft and nontender. No organomegaly or masses felt. Normal bowel sounds heard. Central nervous system: Alert and oriented. No focal neurological deficits. Extremities: Symmetric 5 x 5 power Skin: No rashes, lesions or ulcers, on limited  exam. Psychiatry: Mood & affect appropriate.   Data Reviewed: I have personally reviewed following labs and imaging studies  CBC:  Recent Labs Lab 05/02/16 2145 05/02/16 2152 05/03/16 0328  WBC 7.9  --  15.6*  NEUTROABS 4.5  --   --   HGB 11.9* 12.6 10.7*  HCT 35.8* 37.0 33.2*  MCV 93.2  --  93.5  PLT 184  --  354   Basic Metabolic Panel:  Recent Labs Lab 05/02/16 2145 05/02/16 2152 05/03/16 0328  NA 143 145  --   K 3.4* 3.4*  --   CL 107 110  --   CO2 19*  --   --   GLUCOSE 190* 187*  --   BUN 35* 35*  --   CREATININE 3.92* 4.10*  --   CALCIUM 9.8  --   --   MG  --   --  2.4   GFR: Estimated Creatinine Clearance: 19.4  mL/min (by C-G formula based on SCr of 4.1 mg/dL (H)). Liver Function Tests: No results for input(s): AST, ALT, ALKPHOS, BILITOT, PROT, ALBUMIN in the last 168 hours. No results for input(s): LIPASE, AMYLASE in the last 168 hours. No results for input(s): AMMONIA in the last 168 hours. Coagulation Profile: No results for input(s): INR, PROTIME in the last 168 hours. Cardiac Enzymes: No results for input(s): CKTOTAL, CKMB, CKMBINDEX, TROPONINI in the last 168 hours. BNP (last 3 results) No results for input(s): PROBNP in the last 8760 hours. HbA1C: No results for input(s): HGBA1C in the last 72 hours. CBG:  Recent Labs Lab 05/03/16 0007 05/03/16 0812 05/03/16 1143 05/03/16 1543  GLUCAP 166* 285* 317* 296*   Lipid Profile: No results for input(s): CHOL, HDL, LDLCALC, TRIG, CHOLHDL, LDLDIRECT in the last 72 hours. Thyroid Function Tests: No results for input(s): TSH, T4TOTAL, FREET4, T3FREE, THYROIDAB in the last 72 hours. Anemia Panel: No results for input(s): VITAMINB12, FOLATE, FERRITIN, TIBC, IRON, RETICCTPCT in the last 72 hours. Sepsis Labs:  Recent Labs Lab 05/03/16 0328  PROCALCITON 0.69    Recent Results (from the past 240 hour(s))  MRSA PCR Screening     Status: None   Collection Time: 05/03/16  2:43 AM  Result Value Ref Range Status   MRSA by PCR NEGATIVE NEGATIVE Final    Comment:        The GeneXpert MRSA Assay (FDA approved for NASAL specimens only), is one component of a comprehensive MRSA colonization surveillance program. It is not intended to diagnose MRSA infection nor to guide or monitor treatment for MRSA infections.   Respiratory Panel by PCR     Status: None   Collection Time: 05/03/16  3:27 AM  Result Value Ref Range Status   Adenovirus NOT DETECTED NOT DETECTED Final   Coronavirus 229E NOT DETECTED NOT DETECTED Final   Coronavirus HKU1 NOT DETECTED NOT DETECTED Final   Coronavirus NL63 NOT DETECTED NOT DETECTED Final   Coronavirus OC43  NOT DETECTED NOT DETECTED Final   Metapneumovirus NOT DETECTED NOT DETECTED Final   Rhinovirus / Enterovirus NOT DETECTED NOT DETECTED Final   Influenza A NOT DETECTED NOT DETECTED Final   Influenza B NOT DETECTED NOT DETECTED Final   Parainfluenza Virus 1 NOT DETECTED NOT DETECTED Final   Parainfluenza Virus 2 NOT DETECTED NOT DETECTED Final   Parainfluenza Virus 3 NOT DETECTED NOT DETECTED Final   Parainfluenza Virus 4 NOT DETECTED NOT DETECTED Final   Respiratory Syncytial Virus NOT DETECTED NOT DETECTED Final   Bordetella pertussis  NOT DETECTED NOT DETECTED Final   Chlamydophila pneumoniae NOT DETECTED NOT DETECTED Final   Mycoplasma pneumoniae NOT DETECTED NOT DETECTED Final         Radiology Studies: Dg Chest Portable 1 View  Result Date: 05/02/2016 CLINICAL DATA:  Shortness of breath and cough for 1 day. Hypoxia. Decreased air movement. EXAM: PORTABLE CHEST 1 VIEW COMPARISON:  Radiographs 04/01/2016 FINDINGS: Patient is post median sternotomy and CABG. Cardiomegaly appears increased. Peribronchial cuffing and diffuse interstitial opacities consistent with pulmonary edema. Hazy bilateral lower lobe opacities likely combination of pleural effusion and airspace disease. No pneumothorax. Unchanged osseous structures. IMPRESSION: 1. Pulmonary edema, pleural effusions and cardiomegaly, suspicious for CHF. 2. Hazy lower lobe opacities are likely a combination of pleural effusions and airspace disease, which may be atelectasis or pneumonia. Electronically Signed   By: Jeb Levering M.D.   On: 05/02/2016 22:11        Scheduled Meds: . albuterol  2.5 mg Nebulization Q6H  . allopurinol  100 mg Oral QPM  . amLODipine  10 mg Oral Daily  . aspirin  81 mg Oral Daily  . atorvastatin  40 mg Oral Daily  . azithromycin  250 mg Oral Daily  . cloNIDine  0.2 mg Oral TID  . clopidogrel  75 mg Oral QHS  . enoxaparin (LOVENOX) injection  30 mg Subcutaneous Q24H  . ezetimibe  10 mg Oral Daily   . furosemide  60 mg Intravenous BID  . gabapentin  400 mg Oral BID  . insulin aspart  0-5 Units Subcutaneous QHS  . insulin aspart  0-9 Units Subcutaneous TID WC  . insulin glargine  30 Units Subcutaneous QHS  . isosorbide mononitrate  120 mg Oral Daily  . levothyroxine  25 mcg Oral QAC breakfast  . metoprolol succinate  150 mg Oral Daily  . mometasone-formoterol  2 puff Inhalation BID  . potassium chloride  40 mEq Oral BID  . predniSONE  40 mg Oral Q breakfast  . sodium chloride flush  3 mL Intravenous Q12H   Continuous Infusions:   LOS: 1 day    Time spent: > 35 minutes  Velvet Bathe, MD [facility] Pager [phone]  If 7PM-7AM, please contact night-coverage [email or web address] Password [email or web address] 05/03/2016, 5:08 PM

## 2016-05-03 NOTE — Telephone Encounter (Signed)
Medical Assistant left message on reps home and cell voicemail. MA provided VO for patient to have one visit next week due to the inclement weather hindering a visit this week. Voicemail states to give a call back to [place] with [facility] at [phone].

## 2016-05-04 ENCOUNTER — Inpatient Hospital Stay (HOSPITAL_COMMUNITY): Payer: Medicare Other

## 2016-05-04 DIAGNOSIS — J9601 Acute respiratory failure with hypoxia: Secondary | ICD-10-CM

## 2016-05-04 DIAGNOSIS — R0600 Dyspnea, unspecified: Secondary | ICD-10-CM

## 2016-05-04 LAB — BASIC METABOLIC PANEL
Anion gap: 12 (ref 5–15)
Anion gap: 11 (ref 5–15)
Anion gap: 10 (ref 5–15)
BUN: 55 mg/dL — ABNORMAL HIGH (ref 6–20)
BUN: 58 mg/dL — ABNORMAL HIGH (ref 6–20)
BUN: 56 mg/dL — AB (ref 6–20)
CALCIUM: 9.6 mg/dL (ref 8.9–10.3)
CALCIUM: 9.6 mg/dL (ref 8.9–10.3)
CALCIUM: 9.8 mg/dL (ref 8.9–10.3)
CO2: 21 mmol/L — ABNORMAL LOW (ref 22–32)
CO2: 22 mmol/L (ref 22–32)
CO2: 23 mmol/L (ref 22–32)
CREATININE: 4.53 mg/dL — AB (ref 0.44–1.00)
CREATININE: 4.73 mg/dL — AB (ref 0.44–1.00)
Chloride: 106 mmol/L (ref 101–111)
Chloride: 105 mmol/L (ref 101–111)
Chloride: 105 mmol/L (ref 101–111)
Creatinine, Ser: 4.6 mg/dL — ABNORMAL HIGH (ref 0.44–1.00)
GFR calc Af Amer: 11 mL/min — ABNORMAL LOW (ref >60)
GFR calc Af Amer: 11 mL/min — ABNORMAL LOW (ref >60)
GFR calc non Af Amer: 10 mL/min — ABNORMAL LOW (ref >60)
GFR calc non Af Amer: 9 mL/min — ABNORMAL LOW (ref >60)
GFR calc non Af Amer: 9 mL/min — ABNORMAL LOW (ref >60)
GFR, EST AFRICAN AMERICAN: 11 mL/min — AB (ref >60)
GLUCOSE: 273 mg/dL — AB (ref 65–99)
Glucose, Bld: 312 mg/dL — ABNORMAL HIGH (ref 65–99)
Glucose, Bld: 310 mg/dL — ABNORMAL HIGH (ref 65–99)
Potassium: 5.5 mmol/L — ABNORMAL HIGH (ref 3.5–5.1)
Potassium: 5.8 mmol/L — ABNORMAL HIGH (ref 3.5–5.1)
Potassium: 4.8 mmol/L (ref 3.5–5.1)
SODIUM: 138 mmol/L (ref 135–145)
SODIUM: 139 mmol/L (ref 135–145)
Sodium: 138 mmol/L (ref 135–145)

## 2016-05-04 LAB — ECHOCARDIOGRAM COMPLETE
CHL CUP DOP CALC LVOT VTI: 17 cm
E/e' ratio: 12.36
EWDT: 177 ms
FS: 21 % — AB (ref 28–44)
Height: 70 in
IVS/LV PW RATIO, ED: 0.77
LA diam end sys: 46 mm
LA diam index: 2.03 cm/m2
LA vol index: 42.8 mL/m2
LASIZE: 46 mm
LAVOL: 97.1 mL
LAVOLA4C: 95.6 mL
LV E/e' medial: 12.36
LV e' LATERAL: 9.06 cm/s
LVEEAVG: 12.36
LVOT SV: 53 mL
LVOT area: 3.14 cm2
LVOT diameter: 20 mm
LVOT peak vel: 84.2 cm/s
MV Dec: 177
MV Peak grad: 5 mmHg
MV VTI: 151 cm
MVPKAVEL: 113 m/s
MVPKEVEL: 112 m/s
PISA EROA: 0.11 cm2
PW: 11.8 mm — AB (ref 0.6–1.1)
RV sys press: 38 mmHg
Reg peak vel: 296 cm/s
TDI e' lateral: 9.06
TDI e' medial: 3.49
TRMAXVEL: 296 cm/s
Weight: 3887.15 oz

## 2016-05-04 LAB — CBC
HCT: 27.7 % — ABNORMAL LOW (ref 36.0–46.0)
Hemoglobin: 9 g/dL — ABNORMAL LOW (ref 12.0–15.0)
MCH: 30.5 pg (ref 26.0–34.0)
MCHC: 32.5 g/dL (ref 30.0–36.0)
MCV: 93.9 fL (ref 78.0–100.0)
Platelets: 152 10*3/uL (ref 150–400)
RBC: 2.95 MIL/uL — ABNORMAL LOW (ref 3.87–5.11)
RDW: 15.8 % — AB (ref 11.5–15.5)
WBC: 14.9 10*3/uL — ABNORMAL HIGH (ref 4.0–10.5)

## 2016-05-04 LAB — POCT I-STAT 3, ART BLOOD GAS (G3+)
ACID-BASE DEFICIT: 6 mmol/L — AB (ref 0.0–2.0)
Bicarbonate: 22.6 mmol/L (ref 20.0–28.0)
O2 SAT: 87 %
PH ART: 7.164 — AB (ref 7.350–7.450)
PO2 ART: 68 mmHg — AB (ref 83.0–108.0)
Patient temperature: 98.6
TCO2: 24 mmol/L (ref 0–100)
pCO2 arterial: 62.7 mmHg — ABNORMAL HIGH (ref 32.0–48.0)

## 2016-05-04 LAB — HEMOGLOBIN A1C
HEMOGLOBIN A1C: 5.7 % — AB (ref 4.8–5.6)
MEAN PLASMA GLUCOSE: 117 mg/dL

## 2016-05-04 LAB — GLUCOSE, CAPILLARY
GLUCOSE-CAPILLARY: 218 mg/dL — AB (ref 65–99)
GLUCOSE-CAPILLARY: 270 mg/dL — AB (ref 65–99)
GLUCOSE-CAPILLARY: 280 mg/dL — AB (ref 65–99)
GLUCOSE-CAPILLARY: 287 mg/dL — AB (ref 65–99)
Glucose-Capillary: 273 mg/dL — ABNORMAL HIGH (ref 65–99)
Glucose-Capillary: 253 mg/dL — ABNORMAL HIGH (ref 65–99)

## 2016-05-04 LAB — PHOSPHORUS
Phosphorus: 5.3 mg/dL — ABNORMAL HIGH (ref 2.5–4.6)
Phosphorus: 3.4 mg/dL (ref 2.5–4.6)

## 2016-05-04 LAB — TRIGLYCERIDES: Triglycerides: 171 mg/dL — ABNORMAL HIGH (ref <150)

## 2016-05-04 LAB — MAGNESIUM
MAGNESIUM: 2.4 mg/dL (ref 1.7–2.4)
MAGNESIUM: 2.1 mg/dL (ref 1.7–2.4)

## 2016-05-04 MED ORDER — INSULIN ASPART 100 UNIT/ML ~~LOC~~ SOLN
0.0000 [IU] | SUBCUTANEOUS | Status: DC
  Administered 2016-05-04 (×2): 8 [IU] via SUBCUTANEOUS
  Administered 2016-05-04: 5 [IU] via SUBCUTANEOUS
  Administered 2016-05-04 – 2016-05-05 (×2): 8 [IU] via SUBCUTANEOUS
  Administered 2016-05-05: 11 [IU] via SUBCUTANEOUS
  Administered 2016-05-05: 8 [IU] via SUBCUTANEOUS
  Administered 2016-05-05: 5 [IU] via SUBCUTANEOUS
  Administered 2016-05-05: 11 [IU] via SUBCUTANEOUS
  Administered 2016-05-05 – 2016-05-06 (×5): 8 [IU] via SUBCUTANEOUS
  Administered 2016-05-06: 15 [IU] via SUBCUTANEOUS
  Administered 2016-05-06 (×2): 11 [IU] via SUBCUTANEOUS
  Administered 2016-05-07: 8 [IU] via SUBCUTANEOUS
  Administered 2016-05-07 (×4): 5 [IU] via SUBCUTANEOUS
  Administered 2016-05-08 (×4): 3 [IU] via SUBCUTANEOUS
  Administered 2016-05-08: 2 [IU] via SUBCUTANEOUS
  Administered 2016-05-08: 3 [IU] via SUBCUTANEOUS
  Administered 2016-05-09: 2 [IU] via SUBCUTANEOUS
  Administered 2016-05-09: 5 [IU] via SUBCUTANEOUS
  Administered 2016-05-09: 3 [IU] via SUBCUTANEOUS
  Administered 2016-05-09 (×2): 2 [IU] via SUBCUTANEOUS
  Administered 2016-05-09 – 2016-05-10 (×2): 3 [IU] via SUBCUTANEOUS

## 2016-05-04 MED ORDER — FENTANYL CITRATE (PF) 100 MCG/2ML IJ SOLN
100.0000 ug | Freq: Once | INTRAMUSCULAR | Status: AC
  Administered 2016-05-04: 100 ug via INTRAVENOUS

## 2016-05-04 MED ORDER — MIDAZOLAM HCL 2 MG/2ML IJ SOLN
4.0000 mg | Freq: Once | INTRAMUSCULAR | Status: AC
  Administered 2016-05-04: 4 mg via INTRAVENOUS

## 2016-05-04 MED ORDER — MIDAZOLAM HCL 2 MG/2ML IJ SOLN
INTRAMUSCULAR | Status: AC
  Administered 2016-05-04: 4 mg via INTRAVENOUS
  Filled 2016-05-04: qty 4

## 2016-05-04 MED ORDER — VITAL HIGH PROTEIN PO LIQD
1000.0000 mL | ORAL | Status: DC
  Administered 2016-05-04: 1000 mL
  Filled 2016-05-04 (×2): qty 1000

## 2016-05-04 MED ORDER — PRO-STAT SUGAR FREE PO LIQD
30.0000 mL | Freq: Two times a day (BID) | ORAL | Status: DC
  Administered 2016-05-04 – 2016-05-12 (×15): 30 mL
  Filled 2016-05-04 (×20): qty 30

## 2016-05-04 MED ORDER — VITAL HIGH PROTEIN PO LIQD
1000.0000 mL | ORAL | Status: DC
  Administered 2016-05-05 – 2016-05-07 (×3): 1000 mL
  Filled 2016-05-04 (×8): qty 1000

## 2016-05-04 MED ORDER — ROCURONIUM BROMIDE 50 MG/5ML IV SOLN
80.0000 mg | Freq: Once | INTRAVENOUS | Status: AC
Start: 2016-05-04 — End: 2016-05-04
  Administered 2016-05-04: 80 mg via INTRAVENOUS

## 2016-05-04 MED ORDER — PROPOFOL 1000 MG/100ML IV EMUL
0.0000 ug/kg/min | INTRAVENOUS | Status: DC
  Administered 2016-05-04 – 2016-05-05 (×3): 30 ug/kg/min via INTRAVENOUS
  Administered 2016-05-05: 10 ug/kg/min via INTRAVENOUS
  Administered 2016-05-05: 15 ug/kg/min via INTRAVENOUS
  Administered 2016-05-06 (×2): 20 ug/kg/min via INTRAVENOUS
  Administered 2016-05-06: 15 ug/kg/min via INTRAVENOUS
  Administered 2016-05-07: 20 ug/kg/min via INTRAVENOUS
  Filled 2016-05-04 (×10): qty 100

## 2016-05-04 MED ORDER — FUROSEMIDE 10 MG/ML IJ SOLN
60.0000 mg | Freq: Once | INTRAMUSCULAR | Status: AC
  Administered 2016-05-04: 60 mg via INTRAVENOUS

## 2016-05-04 MED ORDER — IPRATROPIUM-ALBUTEROL 0.5-2.5 (3) MG/3ML IN SOLN
3.0000 mL | Freq: Four times a day (QID) | RESPIRATORY_TRACT | Status: DC
  Administered 2016-05-04 – 2016-05-09 (×21): 3 mL via RESPIRATORY_TRACT
  Filled 2016-05-04 (×21): qty 3

## 2016-05-04 MED ORDER — FENTANYL CITRATE (PF) 100 MCG/2ML IJ SOLN
100.0000 ug | INTRAMUSCULAR | Status: DC | PRN
  Administered 2016-05-04: 100 ug via INTRAVENOUS
  Filled 2016-05-04: qty 2

## 2016-05-04 MED ORDER — FENTANYL CITRATE (PF) 100 MCG/2ML IJ SOLN
INTRAMUSCULAR | Status: AC
  Administered 2016-05-04: 100 ug via INTRAVENOUS
  Filled 2016-05-04: qty 4

## 2016-05-04 MED ORDER — CHLORHEXIDINE GLUCONATE 0.12% ORAL RINSE (MEDLINE KIT)
15.0000 mL | Freq: Two times a day (BID) | OROMUCOSAL | Status: DC
  Administered 2016-05-04 – 2016-05-14 (×11): 15 mL via OROMUCOSAL

## 2016-05-04 MED ORDER — SODIUM CHLORIDE 0.9 % IV SOLN
1.0000 g | INTRAVENOUS | Status: DC
  Administered 2016-05-04 – 2016-05-06 (×3): 1 g via INTRAVENOUS
  Filled 2016-05-04 (×3): qty 1

## 2016-05-04 MED ORDER — LORAZEPAM 2 MG/ML IJ SOLN
0.5000 mg | Freq: Once | INTRAMUSCULAR | Status: AC
  Administered 2016-05-04: 0.5 mg via INTRAVENOUS

## 2016-05-04 MED ORDER — PANTOPRAZOLE SODIUM 40 MG IV SOLR
40.0000 mg | INTRAVENOUS | Status: DC
  Administered 2016-05-04 – 2016-05-05 (×2): 40 mg via INTRAVENOUS
  Filled 2016-05-04 (×2): qty 40

## 2016-05-04 MED ORDER — ACETAMINOPHEN 325 MG PO TABS
650.0000 mg | ORAL_TABLET | Freq: Four times a day (QID) | ORAL | Status: DC | PRN
  Administered 2016-05-05 – 2016-05-14 (×8): 650 mg via ORAL
  Filled 2016-05-04 (×7): qty 2

## 2016-05-04 MED ORDER — ETOMIDATE 2 MG/ML IV SOLN
20.0000 mg | Freq: Once | INTRAVENOUS | Status: AC
  Administered 2016-05-04: 20 mg via INTRAVENOUS

## 2016-05-04 MED ORDER — VANCOMYCIN HCL 10 G IV SOLR
1500.0000 mg | INTRAVENOUS | Status: DC
  Filled 2016-05-04: qty 1500

## 2016-05-04 MED ORDER — PROPOFOL 1000 MG/100ML IV EMUL
0.0000 ug/kg/min | INTRAVENOUS | Status: DC
  Administered 2016-05-04: 20 ug/kg/min via INTRAVENOUS
  Filled 2016-05-04: qty 100

## 2016-05-04 MED ORDER — VANCOMYCIN HCL 10 G IV SOLR
2000.0000 mg | Freq: Once | INTRAVENOUS | Status: AC
  Administered 2016-05-04: 2000 mg via INTRAVENOUS
  Filled 2016-05-04: qty 2000

## 2016-05-04 MED ORDER — FUROSEMIDE 10 MG/ML IJ SOLN
80.0000 mg | Freq: Once | INTRAMUSCULAR | Status: AC
  Administered 2016-05-04: 80 mg via INTRAVENOUS

## 2016-05-04 MED ORDER — LORAZEPAM 2 MG/ML IJ SOLN
INTRAMUSCULAR | Status: AC
  Administered 2016-05-04: 0.5 mg via INTRAVENOUS
  Filled 2016-05-04: qty 1

## 2016-05-04 MED ORDER — FUROSEMIDE 10 MG/ML IJ SOLN
INTRAMUSCULAR | Status: AC
  Filled 2016-05-04: qty 8

## 2016-05-04 MED ORDER — ORAL CARE MOUTH RINSE
15.0000 mL | Freq: Four times a day (QID) | OROMUCOSAL | Status: DC
  Administered 2016-05-05 – 2016-05-09 (×18): 15 mL via OROMUCOSAL

## 2016-05-04 MED ORDER — FENTANYL CITRATE (PF) 100 MCG/2ML IJ SOLN
100.0000 ug | INTRAMUSCULAR | Status: DC | PRN
  Administered 2016-05-04: 100 ug via INTRAVENOUS
  Administered 2016-05-07: 50 ug via INTRAVENOUS
  Filled 2016-05-04: qty 2

## 2016-05-04 NOTE — Progress Notes (Signed)
Pt w/LS full of coarse crackles; resps labored; pt feels like she has to cough.  MD notified and requested PCXR and possible IV Lasix.  Await orders.

## 2016-05-04 NOTE — Progress Notes (Signed)
Dr. Wendee Beavers notified that pt experiencing respiratory distress with tachycardia (HR 120s), tachypnea (Resp 30s-40) and anxiety.  Requested MD to come evaluate patient.  Orders received. Will continue to monitor pt closley.

## 2016-05-04 NOTE — Progress Notes (Signed)
  Echocardiogram 2D Echocardiogram has been performed.  Brooke Stafford 05/04/2016, 3:32 PM

## 2016-05-04 NOTE — Progress Notes (Signed)
Initial Nutrition Assessment  DOCUMENTATION CODES:   Obesity unspecified  INTERVENTION:  - Will increase TF: Vital High Protein @ 50 mL/hr with 30 mL Prostat BID. This regimen provides 1400 kcal, 135 grams of protein, and 1003 mL free water. - PEPuP protocol. - RD will follow-up 1/22 to monitor for possible need to change TF formula with renal function and lytes.   NUTRITION DIAGNOSIS:   Inadequate oral intake related to inability to eat as evidenced by NPO status.  GOAL:   Provide needs based on ASPEN/SCCM guidelines  MONITOR:   TF tolerance, Vent status, Weight trends, Labs, I & O's  REASON FOR ASSESSMENT:   Ventilator, Consult Enteral/tube feeding initiation and management  ASSESSMENT:   61 y.o. female with a past medical history significant for CAD s/p CABG 2002, CHF EF 40-45%, hx CVA with R sided weakness (mild), CKD V not yet on HD, hypothyroidism, anemia and IDDM who presents with acute hypoxic respiratory failure.  Pt seen for new vent and consult. BMI indicates obesity. No family/visitors present at this time. TF protocol initiated and pt was receiving Vital High Protein @ 40 mL/hr at time of RD visit. Will slightly adjust TF, as outlined above, to better meet estimated needs. OGT in place. Pt was intubated today shortly after 0900 after failed trial of BiPAP.   Physical assessment shows no muscle and no fat wasting; mild edema to BLE. Per chart review, weight trending down 04/11/16-04/26/16 and has now trended back up. Will continue to monitor weight trends closely.   Patient is currently intubated on ventilator support MV: 14.4 L/min Temp (24hrs), Avg:98.9 F (37.2 C), Min:98.5 F (36.9 C), Max:99.7 F (37.6 C) Propofol: none BP: 129/69 and MAP: 84.   Medications reviewed; 60 mg IV Lasix x1 dose today, 80 mg IV Lasix x1 dose yesterday and x1 dose today, sliding scale Novolog, 30 units Lantus/day, 25 mcg Synthroid per OGT/day, 40 mg IV Protonix/day, 40 mEq KCl  per OGT BID. Labs reviewed; CBGs: 218-287 mg/dL, K: 5.8 mmol/L, BUN: 56 mg/dL, creatinine: 4.6 mg/dL, GFR: 11 mL/min.    Diet Order:  Diet heart healthy/carb modified Room service appropriate? Yes; Fluid consistency: Thin  Skin:  Reviewed, no issues  Last BM:  1/19  Height:   Ht Readings from Last 1 Encounters:  05/04/16 5\' 10"  (1.778 m)    Weight:   Wt Readings from Last 1 Encounters:  05/04/16 242 lb 15.2 oz (110.2 kg)    Ideal Body Weight:  68.18 kg  BMI:  Body mass index is 34.86 kg/m.  Estimated Nutritional Needs:   Kcal:  6122-4497 (11-14 kcal/kg actual body weight)  Protein:  136 grams (2 grams/kg IBW)  Fluid:  1.5-1.7 L/day  EDUCATION NEEDS:   No education needs identified at this time    Jarome Matin, MS, RD, LDN, CNSC Inpatient Clinical Dietitian Pager # [phone] After hours/weekend pager # [phone]

## 2016-05-04 NOTE — Procedures (Signed)
Intubation Procedure Note Brooke Stafford [id number] Jun 13, 1955  Procedure: Intubation Indications: Respiratory insufficiency  Procedure Details Consent: Unable to obtain consent because of emergent medical necessity. Time Out: Verified patient identification, verified procedure, site/side was marked, verified correct patient position, special equipment/implants available, medications/allergies/relevent history reviewed, required imaging and test results available.  Performed  Maximum sterile technique was used including cap, gloves, gown, hand hygiene and mask.  MAC  glidescope used - pulm edema +  Evaluation Hemodynamic Status: Transient hypertension requiring treatment; O2 sats: stable throughout Patient's Current Condition: stable Complications: No apparent complications Patient did tolerate procedure well. Chest X-ray ordered to verify placement.  CXR: pending.    Dr. Brand Males, M.D., [facility].C.P Pulmonary and Critical Care Medicine Staff Physician [facility] Pager: [phone], If no answer or between  15:00h - 7:00h: call [phone]  05/04/2016 9:21 AM

## 2016-05-04 NOTE — Progress Notes (Signed)
Pharmacy Antibiotic Note  Brooke Stafford is a 62 y.o. female admitted on 05/02/2016 with pneumonia.  Pharmacy has been consulted for meropenem and vancomycin dosing.  Patient was previously being treated with URI/COPD flare with azithromycin will broaden antibiotics given decline. Patient currently afebrile with wbc count stable at 15. Respiratory panel was negative. Patient is CKD V but not on HD yet.   Patient with increased respiratory distress this morning requiring intubation.   Plan: Meropenem 1g q24 hours Vancomycin 2g IV now then 1500mg  IV q24 hours  Height: 5\' 10"  (177.8 cm) Weight: 242 lb 15.2 oz (110.2 kg) IBW/kg (Calculated) : 68.5  Temp (24hrs), Avg:98.8 F (37.1 C), Min:98.5 F (36.9 C), Max:99.2 F (37.3 C)   Recent Labs Lab 05/02/16 2145 05/02/16 2152 05/03/16 0328 05/04/16 0044 05/04/16 0309  WBC 7.9  --  15.6*  --  14.9*  CREATININE 3.92* 4.10*  --  4.53* 4.60*    Estimated Creatinine Clearance: 17.3 mL/min (by C-G formula based on SCr of 4.6 mg/dL (H)).    Allergies  Allergen Reactions  . Digoxin And Related Other (See Comments)    Patient stated she almost died. Had flu like symptoms as well as diarrhea.  . Hydralazine Shortness Of Breath  . Lisinopril Other (See Comments)    Felt like she had the flu. Was very sick!!!  . Penicillins Cross Reactors Hives    And high fever Has patient had a PCN reaction causing immediate rash, facial/tongue/throat swelling, SOB or lightheadedness with hypotension: No Has patient had a PCN reaction causing severe rash involving mucus membranes or skin necrosis: Yes Has patient had a PCN reaction that required hospitalization: Unk Has patient had a PCN reaction occurring within the last 10 years: Unk If all of the above answers are "NO", then may proceed with Cephalosporin use.   . Adhesive [Tape] Rash    bruising    Antimicrobials this admission: 1/19 azith x1 1/18 ctx x1 Vancomycin 1/20>> Meropenem  1/20>>  Dose adjustments this admission:   Microbiology results: Respiratory virus panel - negative Resp culture sent MRSA neg  Thank you for allowing pharmacy to be a part of this patient's care.  Erin Hearing PharmD., BCPS Clinical Pharmacist Pager [phone] 05/04/2016 9:36 AM

## 2016-05-04 NOTE — Consult Note (Signed)
PULMONARY / CRITICAL CARE MEDICINE   Name: Brooke Stafford MRN: [id number] DOB: 10/31/1955    ADMISSION DATE:  05/02/2016 CONSULTATION DATE:  1/20  REFERRING MD:  Wendee Beavers ([facility])   CHIEF COMPLAINT:  Respiratory failure   HISTORY OF PRESENT ILLNESS:   61yo female with hx CAD s/p CABG 2002, CKD V (not yet on HD), HTN, CHF (EF 40-45%) presented 1/18 with 1 week hx URI symptoms with worsening productive cough, SOB.  In ER was hypoxic with sats 60's, febrile, placed on bipap.  She was admitted by [facility] with PNA v pulmonary edema. She was started on IV abx and aggressive diuresis without significant improvement.  She continued to require bipap and on the am of 1/20 had significantly increased WOB, minimal UOP despite multiple doses lasix and PCCM consulted.    Currently awake on bipap.  C/o SOB, cough, anxiety.  Denies chest pain.  Mild hemoptysis.   Afebrile overnight.   PAST MEDICAL HISTORY :  She  has a past medical history of Anemia; Anginal pain ([place]); Anxiety; Arthritis; Asthma; CAD (coronary artery disease) (2002; 2015); CAD (coronary artery disease) of artery bypass graft; DES to VG-Diag 09/28/13 (11/09/2013); Cataract; CHF (congestive heart failure) ([place]); Chronic bronchitis ([place]); CKD (chronic kidney disease); Coronary artery disease (2002); GERD (gastroesophageal reflux disease); Gout; Headache; History of blood transfusion (2002); Hyperlipidemia; Hypertension; Hypothyroid; Migraines; Myocardial infarction (2000; 2002; 2011); Obesity (BMI 35.0-39.9 without comorbidity); Peripheral vascular disease ([place]) (12/12); Pneumonia; and Type II diabetes mellitus ([place]).  PAST SURGICAL HISTORY: She  has a past surgical history that includes Cholecystectomy (1982); Cesarean section (1978; 1980); Tubal ligation (1980); Breast cyst excision (Right, 1970's); Coronary artery bypass graft (11/20/2000); Peripheral arterial stent graft (Left); NM MYOCAR PERF WALL MOTION (08/27/2004); Appendectomy (1980); abdominal  aortagram (N/A, 04/05/2011); renal angiogram (N/A, 04/05/2011); lower extremity angiogram (12/01/2012); percutaneous stent intervention (Left, 12/01/2012); left heart catheterization with coronary/graft angiogram (N/A, 09/28/2013); Cardiac catheterization (2002); Coronary angioplasty with stent (2004; 2012); Coronary angioplasty with stent (09/28/13); Cardiac catheterization (N/A, 12/12/2014); and Cardiac catheterization (N/A, 12/12/2014).  Allergies  Allergen Reactions  . Digoxin And Related Other (See Comments)    Patient stated she almost died. Had flu like symptoms as well as diarrhea.  . Hydralazine Shortness Of Breath  . Lisinopril Other (See Comments)    Felt like she had the flu. Was very sick!!!  . Penicillins Cross Reactors Hives    And high fever Has patient had a PCN reaction causing immediate rash, facial/tongue/throat swelling, SOB or lightheadedness with hypotension: No Has patient had a PCN reaction causing severe rash involving mucus membranes or skin necrosis: Yes Has patient had a PCN reaction that required hospitalization: Unk Has patient had a PCN reaction occurring within the last 10 years: Unk If all of the above answers are "NO", then may proceed with Cephalosporin use.   . Adhesive [Tape] Rash    bruising    No current facility-administered medications on file prior to encounter.    Current Outpatient Prescriptions on File Prior to Encounter  Medication Sig  . acetaminophen-codeine (TYLENOL #3) 300-30 MG tablet Take 1 tablet by mouth every 4 (four) hours as needed for moderate pain.  [name] albuterol (PROVENTIL HFA;VENTOLIN HFA) 108 (90 Base) MCG/ACT inhaler Inhale 2 puffs into the lungs every 6 (six) hours as needed for wheezing or shortness of breath.  [name] albuterol (PROVENTIL) (2.5 MG/3ML) 0.083% nebulizer solution Take 3 mLs (2.5 mg total) by nebulization every 6 (six) hours as needed for wheezing or shortness of  breath.  . allopurinol (ZYLOPRIM) 300 MG tablet Take 1  tablet (300 mg total) by mouth daily. (Patient taking differently: Take 300 mg by mouth every evening. )  . amLODipine (NORVASC) 10 MG tablet take 1 tablet by mouth once daily (Patient taking differently: Take 10 mg by mouth daily)  . aspirin 81 MG chewable tablet Chew 1 tablet (81 mg total) by mouth daily.  [name] atorvastatin (LIPITOR) 40 MG tablet Take 1 tablet (40 mg total) by mouth daily.  . cloNIDine (CATAPRES) 0.2 MG tablet Take 1 tablet (0.2 mg total) by mouth 3 (three) times daily.  . clopidogrel (PLAVIX) 75 MG tablet Take 1 tablet (75 mg total) by mouth daily.  [name] ezetimibe (ZETIA) 10 MG tablet Take 1 tablet (10 mg total) by mouth daily.  . fluticasone (FLONASE) 50 MCG/ACT nasal spray Place 2 sprays into both nostrils daily as needed for allergies or rhinitis.  . Fluticasone-Salmeterol (ADVAIR DISKUS) 250-50 MCG/DOSE AEPB Inhale 1 puff into the lungs 2 (two) times daily.  . furosemide (LASIX) 20 MG tablet Take a total of 60 mg in the morning and 40 mg at night. (Patient taking differently: Take 80 mg by mouth daily. )  . gabapentin (NEURONTIN) 400 MG capsule take 1 capsule by mouth three times a day (Patient taking differently: Take 400 mg by mouth twice daily)  . hydrOXYzine (ATARAX/VISTARIL) 25 MG tablet Take 1 tablet (25 mg total) by mouth 3 (three) times daily.  . insulin aspart (NOVOLOG FLEXPEN) 100 UNIT/ML FlexPen Inject 10-30 units into the skin 3 times daily, plus sliding scale as instructed. (Patient taking differently: Inject 10-20 Units into the skin 3 (three) times daily with meals. Inject 10-30 units into the skin 3 times daily, plus sliding scale as instructed.)  . Insulin Glargine (TOUJEO SOLOSTAR) 300 UNIT/ML SOPN Inject 35 Units into the skin at bedtime. (Patient taking differently: Inject 30 Units into the skin at bedtime. )  . isosorbide mononitrate (IMDUR) 60 MG 24 hr tablet Take 2 tablets (120 mg total) by mouth daily.  [name] levothyroxine (SYNTHROID, LEVOTHROID) 25 MCG tablet  take 1 tablet by mouth every morning BEFORE BREAKFAST (Patient taking differently: TAKE 25 MCG BY MOUTH EACH MORNING)  . loratadine (CLARITIN) 10 MG tablet Take 1 tablet (10 mg total) by mouth daily as needed for allergies.  . magnesium citrate SOLN Take 296 mLs (1 Bottle total) by mouth as needed for severe constipation.  . metoprolol succinate (TOPROL-XL) 50 MG 24 hr tablet Take 3 tablets (150 mg total) by mouth daily. Take with or immediately following a meal.  . Naphazoline HCl (CLEAR EYES OP) Place 1 drop into both eyes as needed (for dry eyes).  . nitroGLYCERIN (NITROSTAT) 0.4 MG SL tablet Place 1 tablet (0.4 mg total) under the tongue every 5 (five) minutes as needed.  . prednisoLONE acetate (PRED FORTE) 1 % ophthalmic suspension Place 1 drop into both eyes 3 (three) times daily.  . ranitidine (ZANTAC 75) 75 MG tablet Take 1 tablet (75 mg total) by mouth 2 (two) times daily. (Patient taking differently: Take 75 mg by mouth 2 (two) times daily as needed for heartburn. )  . senna-docusate (SENOKOT-S) 8.6-50 MG tablet Take 1 tablet by mouth at bedtime as needed for mild constipation.  . simethicone (MYLICON) 833 MG chewable tablet Chew 125 mg by mouth every 6 (six) hours as needed for flatulence.  . triamcinolone ointment (KENALOG) 0.5 % Apply 1 application topically 2 (two) times daily. (Patient taking differently: Apply 1  application topically daily as needed (for skin). )  . B-D UF III MINI PEN NEEDLES 31G X 5 MM MISC USE TO INJECT INSULIN 4 TIMES A DAY AS INSTRUCTED    FAMILY HISTORY:  Her indicated that her mother is deceased. She indicated that her father is deceased. She indicated that only one of her four sisters is alive. She indicated that only one of her two brothers is alive. She indicated that her maternal grandmother is deceased. She indicated that her maternal grandfather is deceased. She indicated that her paternal grandmother is deceased. She indicated that her paternal  grandfather is deceased.    SOCIAL HISTORY: She  reports that she quit smoking about 16 years ago. Her smoking use included Cigarettes. She smoked 0.00 packs per day for 25.00 years. She has never used smokeless tobacco. She reports that she does not drink alcohol or use drugs.  REVIEW OF SYSTEMS:   As per HPI - All other systems reviewed and were neg.    SUBJECTIVE:    VITAL SIGNS: BP (!) 151/84   Pulse (!) 102   Temp 98.6 F (37 C) (Oral)   Resp 12   Ht 5\' 10"  (1.778 m)   Wt 110.2 kg (242 lb 15.2 oz)   LMP 10/13/2014 (Exact Date)   SpO2 100%   BMI 34.86 kg/m   HEMODYNAMICS:    VENTILATOR SETTINGS: Vent Mode: BIPAP;PCV FiO2 (%):  [50 %-100 %] 100 % Set Rate:  [12 bmp-15 bmp] 15 bmp PEEP:  [5 cmH20] 5 cmH20  INTAKE / OUTPUT: I/O last 3 completed shifts: In: 300 [IV Piggyback:300] Out: 775 [Urine:775]  PHYSICAL EXAMINATION:  General:  Chronically ill appearing female, mild distress in bipap  Neuro:  Awake, oriented  HEENT:  Mm dry, bipap, no JVD  Cardiovascular:  s1s2 rrr, tachy  Lungs:  resps labored on bipap, tachypneic, coarse R>L  Abdomen:  Round, soft, non tender  Musculoskeletal:  Warm and dry, scant BLE edema   LABS:  BMET  Recent Labs Lab 05/02/16 2145 05/02/16 2152 05/04/16 0044 05/04/16 0309  NA 143 145 139 138  K 3.4* 3.4* 5.5* 5.8*  CL 107 110 106 105  CO2 19*  --  21* 22  BUN 35* 35* 55* 56*  CREATININE 3.92* 4.10* 4.53* 4.60*  GLUCOSE 190* 187* 312* 273*    Electrolytes  Recent Labs Lab 05/02/16 2145 05/03/16 0328 05/04/16 0044 05/04/16 0309  CALCIUM 9.8  --  9.8 9.6  MG  --  2.4  --   --     CBC  Recent Labs Lab 05/02/16 2145 05/02/16 2152 05/03/16 0328 05/04/16 0309  WBC 7.9  --  15.6* 14.9*  HGB 11.9* 12.6 10.7* 9.0*  HCT 35.8* 37.0 33.2* 27.7*  PLT 184  --  175 152    Coag's No results for input(s): APTT, INR in the last 168 hours.  Sepsis Markers  Recent Labs Lab 05/03/16 0328  PROCALCITON 0.69     ABG No results for input(s): PHART, PCO2ART, PO2ART in the last 168 hours.  Liver Enzymes No results for input(s): AST, ALT, ALKPHOS, BILITOT, ALBUMIN in the last 168 hours.  Cardiac Enzymes  Recent Labs Lab 05/03/16 1900  TROPONINI 0.60*    Glucose  Recent Labs Lab 05/03/16 0007 05/03/16 0812 05/03/16 1143 05/03/16 1543 05/03/16 2125 05/04/16 0735  GLUCAP 166* 285* 317* 296* 328* 218*    Imaging Dg Chest Port 1 View  Result Date: 05/04/2016 CLINICAL DATA:  Hypoxia EXAM: PORTABLE CHEST  1 VIEW COMPARISON:  05/02/2016 FINDINGS: Post sternotomy changes are again evident. There is mild to moderate cardiomegaly. Slightly improved aeration of the left thorax. Slight worsening of air space disease in the right hemithorax. Possible small effusions. No pneumothorax. IMPRESSION: Cardiomegaly unchanged. Slightly improved aeration of the left thorax with residual infiltrate or edema in the left lung base and end oval opacity in the left mid to lower lung. Slightly worsened airspace disease in the right thorax which may represent edema or pneumonia. Electronically Signed   By: Donavan Foil M.D.   On: 05/04/2016 04:18     STUDIES:  2D echo 1/20>>>  CULTURES: RVP 1/19>>> NEG RVP (from tracheal aspirate) 1/20>>> Sputum 1/20>>>   ANTIBIOTICS: azithro 1/20>>> Vanc 1/20>>  Imipenem 1/20>>>  SIGNIFICANT EVENTS:   LINES/TUBES: ETT 1/20>>>  DISCUSSION: 61yo female with acute respiratory failure in setting acute on chronic CHF, CKD, PNA. Failed bipap, intubated 1/20.   ASSESSMENT / PLAN:  PULMONARY Acute hypoxic respiratory failure - multifactorial in setting probable PNA and acute on chronic CHF with underling COPD.  Did not respond to diuresis.  Worsening CXR. Failed bipap.  P:   Intubate  tx ICU  Vent support - 8cc/kg  F/u CXR  F/u ABG Broaden abx as below Hold steroids for now - suspect COPD less of an issue, no bronchospasm Sputum culture  BD's    CARDIOVASCULAR Acute on chronic combined CHF  Hx CAD - s/p CABG 2002, PCI 2016 and 2016 HTN P:  Trend troponin  Echo  Hold anti-HTN for now and re-assess post intubation/sedation   Hold further lasix for now  Asa, statin, plavix  Place CVL for CVP    RENAL Acute on CKD V - baseline Scr ~4 Hyperkalemia - mild  P:   F/u chem this pm and in am  Hold further diuresis for now  May need renal to see - will hold off for now   GASTROINTESTINAL No active issue  P:   NPO  Start TF  PPI   HEMATOLOGIC No active issue  P:  lovenox  F/u CBC   INFECTIOUS ?PNA  P:   Broaden abx - vanc, meropenem for now with worsening clinical status  Trend pct  Sputum culture  Will recheck RVP and flu with tracheal aspirate   ENDOCRINE DM  hypothryoid  P:   SSI, lantus  Continue synthroid   NEUROLOGIC Sedation needs on vent  P:   RASS goal: -1 Propofol, fent gtt    FAMILY  - Updates:  No family available 1/20 - nurses to continue attempts to reach   - Inter-disciplinary family meet or Palliative Care meeting due by: 1/27    Nickolas Madrid, NP 05/04/2016  9:22 AM Pager: [phone] or [phone]

## 2016-05-04 NOTE — Progress Notes (Signed)
Received page patient's respiratory distress worsening. Orders given for Lasix 80 mg IV and Ativan. Critical care team notified. Per their evaluation patient will require intubation. Critical care team to take over care.   Will resume medical management with improvement in condition once critical care team decides to transition care.   Velvet Bathe    Buyer, retail # for transition of care  [phone]

## 2016-05-04 NOTE — Progress Notes (Signed)
Paged and requested CCM to come evaluate patient for respiratory distress.

## 2016-05-04 NOTE — Progress Notes (Signed)
Pt transported on vent from 4NP09 to 2M14. Vitals remained stable throughout.

## 2016-05-04 NOTE — Care Management Note (Signed)
Case Management Note  Patient Details  Name: Brooke Stafford MRN: [id number] Date of Birth: 07-Feb-1956  Subjective/Objective:  Adm w resp failure                 Action/Plan: needs help w housing, sw ref placed.   Expected Discharge Date:                  Expected Discharge Plan:  [place]  In-House Referral:  Clinical Social Work  Discharge planning Services     Post Acute Care Choice:  Resumption of Svcs/PTA Provider Choice offered to:     DME Arranged:    DME Agency:     HH Arranged:  RN, PT, Social Work [employer] Agency:  Other - See comment  Status of Service:  In process, will continue to follow  If discussed at Long Length of Stay Meetings, dates discussed:    Additional Comments:pt act w [facility] and wants to remain with them. Followed at [facility] pcp Lacretia Leigh, RN 05/04/2016, 7:25 AM

## 2016-05-05 ENCOUNTER — Inpatient Hospital Stay (HOSPITAL_COMMUNITY): Payer: Medicare Other

## 2016-05-05 LAB — BASIC METABOLIC PANEL
Anion gap: 9 (ref 5–15)
BUN: 76 mg/dL — AB (ref 6–20)
CALCIUM: 8.8 mg/dL — AB (ref 8.9–10.3)
CO2: 23 mmol/L (ref 22–32)
CREATININE: 5.55 mg/dL — AB (ref 0.44–1.00)
Chloride: 107 mmol/L (ref 101–111)
GFR calc Af Amer: 9 mL/min — ABNORMAL LOW (ref >60)
GFR, EST NON AFRICAN AMERICAN: 7 mL/min — AB (ref >60)
GLUCOSE: 247 mg/dL — AB (ref 65–99)
Potassium: 5.1 mmol/L (ref 3.5–5.1)
SODIUM: 139 mmol/L (ref 135–145)

## 2016-05-05 LAB — CBC
HCT: 24 % — ABNORMAL LOW (ref 36.0–46.0)
Hemoglobin: 7.8 g/dL — ABNORMAL LOW (ref 12.0–15.0)
MCH: 30.8 pg (ref 26.0–34.0)
MCHC: 32.5 g/dL (ref 30.0–36.0)
MCV: 94.9 fL (ref 78.0–100.0)
PLATELETS: 131 10*3/uL — AB (ref 150–400)
RBC: 2.53 MIL/uL — ABNORMAL LOW (ref 3.87–5.11)
RDW: 15.9 % — AB (ref 11.5–15.5)
WBC: 8.6 10*3/uL (ref 4.0–10.5)

## 2016-05-05 LAB — GLUCOSE, CAPILLARY
GLUCOSE-CAPILLARY: 260 mg/dL — AB (ref 65–99)
GLUCOSE-CAPILLARY: 312 mg/dL — AB (ref 65–99)
Glucose-Capillary: 241 mg/dL — ABNORMAL HIGH (ref 65–99)
Glucose-Capillary: 309 mg/dL — ABNORMAL HIGH (ref 65–99)
Glucose-Capillary: 290 mg/dL — ABNORMAL HIGH (ref 65–99)

## 2016-05-05 LAB — PHOSPHORUS
PHOSPHORUS: 2.2 mg/dL — AB (ref 2.5–4.6)
Phosphorus: 2 mg/dL — ABNORMAL LOW (ref 2.5–4.6)

## 2016-05-05 LAB — MAGNESIUM
MAGNESIUM: 2.2 mg/dL (ref 1.7–2.4)
Magnesium: 2.2 mg/dL (ref 1.7–2.4)

## 2016-05-05 LAB — PROCALCITONIN: Procalcitonin: 14.61 ng/mL

## 2016-05-05 MED ORDER — INSULIN GLARGINE 100 UNIT/ML ~~LOC~~ SOLN
35.0000 [IU] | Freq: Every day | SUBCUTANEOUS | Status: DC
  Administered 2016-05-05: 35 [IU] via SUBCUTANEOUS
  Filled 2016-05-05: qty 0.35

## 2016-05-05 MED ORDER — FUROSEMIDE 10 MG/ML IJ SOLN
160.0000 mg | Freq: Three times a day (TID) | INTRAVENOUS | Status: DC
  Administered 2016-05-05 – 2016-05-06 (×3): 160 mg via INTRAVENOUS
  Filled 2016-05-05 (×4): qty 16

## 2016-05-05 MED ORDER — FUROSEMIDE 10 MG/ML IJ SOLN
160.0000 mg | Freq: Three times a day (TID) | INTRAVENOUS | Status: DC
  Filled 2016-05-05: qty 16

## 2016-05-05 MED ORDER — METOPROLOL TARTRATE 100 MG PO TABS
100.0000 mg | ORAL_TABLET | Freq: Two times a day (BID) | ORAL | Status: DC
  Administered 2016-05-05 – 2016-05-07 (×5): 100 mg
  Filled 2016-05-05 (×6): qty 1

## 2016-05-05 MED ORDER — PANTOPRAZOLE SODIUM 40 MG PO PACK
40.0000 mg | PACK | Freq: Every day | ORAL | Status: DC
  Administered 2016-05-06 – 2016-05-14 (×7): 40 mg
  Filled 2016-05-05 (×9): qty 20

## 2016-05-05 MED ORDER — SODIUM PHOSPHATES 45 MMOLE/15ML IV SOLN
10.0000 mmol | Freq: Once | INTRAVENOUS | Status: AC
  Administered 2016-05-05: 10 mmol via INTRAVENOUS
  Filled 2016-05-05: qty 3.33

## 2016-05-05 MED ORDER — INSULIN ASPART 100 UNIT/ML ~~LOC~~ SOLN
5.0000 [IU] | SUBCUTANEOUS | Status: DC
  Administered 2016-05-05 – 2016-05-06 (×4): 5 [IU] via SUBCUTANEOUS

## 2016-05-05 NOTE — Consult Note (Signed)
PULMONARY / CRITICAL CARE MEDICINE   Name: NOTNAMED CROUCHER MRN: [id number] DOB: 1955/08/30    ADMISSION DATE:  05/02/2016 CONSULTATION DATE:  1/20  REFERRING MD:  Wendee Beavers ([facility])   CHIEF COMPLAINT:  Respiratory failure   HISTORY OF PRESENT ILLNESS:   61yo female with hx CAD s/p CABG 2002, CKD V (not yet on HD), HTN, CHF (EF 40-45%) presented 1/18 with 1 week hx URI symptoms with worsening productive cough, SOB.  In ER was hypoxic with sats 60's, febrile, placed on bipap.  She was admitted by [facility] with PNA v pulmonary edema. She was started on IV abx and aggressive diuresis without significant improvement.  She continued to require bipap and on the am of 1/20 had significantly increased WOB, minimal UOP despite multiple doses lasix and PCCM consulted.     SUBJECTIVE:  Intubated yesterday, on propofol and unresponsive to verbal commands.  Increase in creatinine and BUN overnight.  VITAL SIGNS: BP (!) 115/57   Pulse 86   Temp (!) 101.6 F (38.7 C) (Oral)   Resp (!) 24   Ht 5\' 10"  (1.778 m)   Wt 109.4 kg (241 lb 2.9 oz)   LMP 10/13/2014 (Exact Date)   SpO2 99%   BMI 34.61 kg/m   HEMODYNAMICS:    VENTILATOR SETTINGS: Vent Mode: PRVC FiO2 (%):  [40 %-100 %] 40 % Set Rate:  [14 bmp-24 bmp] 24 bmp Vt Set:  [550 mL] 550 mL PEEP:  [5 cmH20] 5 cmH20 Plateau Pressure:  [25 cmH20-40 cmH20] 26 cmH20  INTAKE / OUTPUT: I/O last 3 completed shifts: In: 2314.6 [I.V.:374.4; Other:150; NG/GT:1190.2; IV Piggyback:600] Out: 0175 [Urine:1691]  PHYSICAL EXAMINATION:  General:  Chronically ill appearing female,  Neuro:  Unresponsive to verbal commands, minimal response to painful stimuli HEENT:  [place]/AT, no JVD  Cardiovascular:  s1s2 rrr, tachy  Lungs: coarse breath sounds R>L  Abdomen:  Round, soft, non tender  Musculoskeletal:  Warm and dry, 1+ pitting edema BLE   LABS:  BMET  Recent Labs Lab 05/04/16 0309 05/04/16 1015 05/05/16 0454  NA 138 138 139  K 5.8* 4.8 5.1  CL 105  105 107  CO2 22 23 23   BUN 56* 58* 76*  CREATININE 4.60* 4.73* 5.55*  GLUCOSE 273* 310* 247*    Electrolytes  Recent Labs Lab 05/04/16 0309 05/04/16 1015 05/04/16 1658 05/05/16 0454  CALCIUM 9.6 9.6  --  8.8*  MG  --  2.4 2.1 2.2  PHOS  --  5.3* 3.4 2.0*    CBC  Recent Labs Lab 05/03/16 0328 05/04/16 0309 05/05/16 0454  WBC 15.6* 14.9* 8.6  HGB 10.7* 9.0* 7.8*  HCT 33.2* 27.7* 24.0*  PLT 175 152 131*    Coag's No results for input(s): APTT, INR in the last 168 hours.  Sepsis Markers  Recent Labs Lab 05/03/16 0328 05/05/16 0454  PROCALCITON 0.69 14.61    ABG  Recent Labs Lab 05/04/16 1201  PHART 7.164*  PCO2ART 62.7*  PO2ART 68.0*    Liver Enzymes No results for input(s): AST, ALT, ALKPHOS, BILITOT, ALBUMIN in the last 168 hours.  Cardiac Enzymes  Recent Labs Lab 05/03/16 1900  TROPONINI 0.60*    Glucose  Recent Labs Lab 05/04/16 1015 05/04/16 1117 05/04/16 1702 05/04/16 1941 05/04/16 2346 05/05/16 0338  GLUCAP 273* 287* 253* 280* 270* 241*    Imaging Dg Chest Port 1 View  Result Date: 05/04/2016 CLINICAL DATA:  Verify ET tube placement and OG tube placement EXAM: PORTABLE CHEST 1 VIEW  COMPARISON:  05/04/2016 FINDINGS: ET tube tip is above the carina. The OG tube tip is within the stomach below the field of view. Stable cardiac enlargement. Previous median sternotomy and CABG procedure. Bilateral airspace opacities are unchanged from previous exam. IMPRESSION: 1. No change in aeration to lungs compared with previous exam. 2. Satisfactory position of ET tube and OG tube. Electronically Signed   By: Kerby Moors M.D.   On: 05/04/2016 09:51   Dg Abd Portable 1v  Result Date: 05/04/2016 CLINICAL DATA:  Orogastric tube placement confirmation. EXAM: PORTABLE ABDOMEN - 1 VIEW COMPARISON:  None. FINDINGS: An orogastric tube extends below the diaphragm, through the stomach and with its tip positioned in the proximal duodenum at the level  of the duodenal bulb. Underlying bowel gas shows no evidence of overt obstruction or significant ileus. IMPRESSION: Gastric tube extends through the stomach and its tip is positioned at the level of the duodenal bulb. Electronically Signed   By: Aletta Edouard M.D.   On: 05/04/2016 09:53     STUDIES:  2D echo 1/20>>> LV EF: 35% -   81%, grade 2 diastolic dysfunction  CULTURES: RVP 1/19>>> NEG RVP (from tracheal aspirate) 1/20>>>pending Sputum 1/20>>>   ANTIBIOTICS: azithro 1/20>>>1/20 Vanc 1/20>>  Imipenem 1/20>>>  SIGNIFICANT EVENTS:   LINES/TUBES: ETT 1/20>>>  DISCUSSION: 61yo female with acute respiratory failure in setting acute on chronic CHF, CKD, PNA. Failed bipap, intubated 1/20.   ASSESSMENT / PLAN:  PULMONARY Acute hypoxic respiratory failure - multifactorial in setting probable PNA and acute on chronic CHF with underling COPD.  Did not respond to diuresis.  Worsening CXR. Failed bipap.  P:    Vent support - 8cc/kg  F/u CXR >> pending On broad spectrum abx Hold steroids for now - suspect COPD less of an issue, no bronchospasm Sputum culture >> pending BD's  CARDIOVASCULAR Acute on chronic combined CHF  Hx CAD - s/p CABG 2002, PCI 2016 and 2016 HTN P:    Hold further lasix for now  Asa, statin, plavix  Clonidine, amlodipine, IMDUR  RENAL Acute on CKD V - baseline Scr ~4 Hyperkalemia - resolved P:   Replace lytes as needed Hold further diuresis for now  May need renal to see - will hold off for now   GASTROINTESTINAL No active issue  P:   NPO   PPI   HEMATOLOGIC Thrombocytopenia - in setting of acute illness  P:  lovenox  F/u CBC   INFECTIOUS ?PNA  Elevated PCT - 14.61 P:   Broaden abx - vanc, meropenem for now with worsening clinical status  Trend pct  Sputum culture >> pending  Will recheck RVP and flu with tracheal aspirate >> pending   ENDOCRINE DM  hypothyroid  P:   SSI, lantus  Continue synthroid   NEUROLOGIC Sedation  needs on vent  P:   RASS goal: -1 Propofol    FAMILY  - Updates:  No family available 1/20 - nurses to continue attempts to reach   - Inter-disciplinary family meet or Palliative Care meeting due by: 1/27

## 2016-05-05 NOTE — Consult Note (Addendum)
[facility] Consult Note     Date: 05/05/2016                  Patient Name:  Brooke Stafford  MRN: [id number]  DOB: [birth year]  Age / Sex: 61 y.o., female         PCP: No primary care provider on file.                 Service Requesting Consult: Dr. Chase Caller CCM                 Reason for Consult: Acute on chronic injury vs progression of CKD V            Chief Complaint:  Cough, SOB  HPI: 61yo female with hx CAD s/p CABG 2002, CKD V (not yet on HD), HTN, PVD, CHF (EF 40-45%) presented 1/18 with 1 week hx URI symptoms with worsening productive cough, SOB.  History obtained from chart review as pt is intubated and sedated.    Briefly, pt presented with the above symptoms.  In the ED , found to be hypoxic to the 60s and febrile and was placed on BiPaP.  Negative RVP Was placed on vanc/ meropenem.  No cultures.  Intubated for worsening acute hypoxic respiratory failure.    Was given Lasix 80 IV without good urine output.  Baseline creatinine in the mid-4s.  Now 5.55.  Was mildly hyperkalemic.  Unknown if she has a nephrologist and no family at bedside to confirm.       Past Medical History:  Diagnosis Date  . Anemia   . Anginal pain ([place])   . Anxiety   . Arthritis    "stiff fingers and knees" (08/04/2013), (12/12/2014)  . Asthma   . CAD (coronary artery disease) 2002; 2015   CABG x 6 2002, cath 2011- med Rx stent DES VG-Diag  . CAD (coronary artery disease) of artery bypass graft; DES to VG-Diag 09/28/13 11/09/2013  . Cataract   . CHF (congestive heart failure) ([place])    "in 2002" (11/26/2012)  . Chronic bronchitis ([place])    "q year; in the winter"   . CKD (chronic kidney disease)    stage 4, followed by [facility]  . Coronary artery disease 2002   CABG x 6. Cath 5/11- med Rx  . GERD (gastroesophageal reflux disease)   . Gout    "right big toe"  . Headache    "~ q week" (08/04/2013); "~ twice/month" (12/12/2014)  . History of blood transfusion 2002    "when I had OHS"  . Hyperlipidemia   . Hypertension   . Hypothyroid    treated  . Migraines    "couple times/year" (08/04/2013), (12/12/2014)  . Myocardial infarction 2000; 2002; 2011  . Obesity (BMI 35.0-39.9 without comorbidity)   . Peripheral vascular disease ([place]) 12/12   LSFA PTA  . Pneumonia    "3 times I think" (12/12/2014)  . Type II diabetes mellitus ([place])     Past Surgical History:  Procedure Laterality Date  . ABDOMINAL AORTAGRAM N/A 04/05/2011   Procedure: ABDOMINAL AORTAGRAM;  Surgeon: Lorretta Harp, MD;  Location: [facility] CATH LAB;  Service: Cardiovascular;  Laterality: N/A;  . APPENDECTOMY  1980  . BREAST CYST EXCISION Right 1970's  . CARDIAC CATHETERIZATION  2002  . CARDIAC CATHETERIZATION N/A 12/12/2014   Procedure: Left Heart Cath and Cors/Grafts Angiography;  Surgeon: Peter M Martinique, MD;  Location: [facility] LAB;  Service: Cardiovascular;  Laterality: N/A;  . CARDIAC CATHETERIZATION N/A 12/12/2014   Procedure: Coronary Stent Intervention;  Surgeon: Peter M Martinique, MD;  Location: [place] CV LAB;  Service: Cardiovascular;  Laterality: N/A;  . [place]; 1980  . CHOLECYSTECTOMY  1982  . CORONARY ANGIOPLASTY WITH STENT PLACEMENT  2004; 2012   "I have 2 stents" (08/04/2013)  . CORONARY ANGIOPLASTY WITH STENT PLACEMENT  09/28/13   PTCA/ DES Xience stent to VG-Diag   . CORONARY ARTERY BYPASS GRAFT  11/20/2000   x6 LIMA to distal LAD, svg to first diag, svg to ramus intermediate branch and swquential SVG to cir marginal branch, SVG to posterior descending coronary and sequential SVG to first right posterolateral branch  . LEFT HEART CATHETERIZATION WITH CORONARY/GRAFT ANGIOGRAM N/A 09/28/2013   Procedure: LEFT HEART CATHETERIZATION WITH Beatrix Fetters;  Surgeon: Peter M Martinique, MD;  Location: [facility] CATH LAB;  Service: Cardiovascular;  Laterality: N/A;  . LOWER EXTREMITY ANGIOGRAM  12/01/2012   Procedure: LOWER EXTREMITY ANGIOGRAM;  Surgeon: Lorretta Harp, MD;  Location: [facility] CATH LAB;  Service: Cardiovascular;;  . NM MYOCAR PERF WALL MOTION  08/27/2004   negative  . PERCUTANEOUS STENT INTERVENTION Left 12/01/2012   Procedure: PERCUTANEOUS STENT INTERVENTION;  Surgeon: Lorretta Harp, MD;  Location: [facility] CATH LAB;  Service: Cardiovascular;  Laterality: Left;  Left SFA  . PERIPHERAL ARTERIAL STENT GRAFT Left    SFA/notes 04/07/2011 (11/30/2012)  . RENAL ANGIOGRAM N/A 04/05/2011   Procedure: RENAL ANGIOGRAM;  Surgeon: Lorretta Harp, MD;  Location: [facility] CATH LAB;  Service: Cardiovascular;  Laterality: N/A;  . TUBAL LIGATION  1980    Family History  Problem Relation Age of Onset  . Diabetes Mother   . Hypertension Mother   . Stroke Mother   . Hypertension Father   . Hypertension Brother   . Hypertension Sister   . Diabetes Sister   . Hyperlipidemia Sister    Social History:  reports that she quit smoking about 16 years ago. Her smoking use included Cigarettes. She smoked 0.00 packs per day for 25.00 years. She has never used smokeless tobacco. She reports that she does not drink alcohol or use drugs.  Allergies:  Allergies  Allergen Reactions  . Digoxin And Related Other (See Comments)    Patient stated she almost died. Had flu like symptoms as well as diarrhea.  . Hydralazine Shortness Of Breath  . Lisinopril Other (See Comments)    Felt like she had the flu. Was very sick!!!  . Penicillins Cross Reactors Hives    And high fever Has patient had a PCN reaction causing immediate rash, facial/tongue/throat swelling, SOB or lightheadedness with hypotension: No Has patient had a PCN reaction causing severe rash involving mucus membranes or skin necrosis: Yes Has patient had a PCN reaction that required hospitalization: Unk Has patient had a PCN reaction occurring within the last 10 years: Unk If all of the above answers are "NO", then may proceed with Cephalosporin use.   . Adhesive [Tape] Rash    bruising    Medications Prior  to Admission  Medication Sig Dispense Refill  . acetaminophen-codeine (TYLENOL #3) 300-30 MG tablet Take 1 tablet by mouth every 4 (four) hours as needed for moderate pain. 60 tablet 0  . albuterol (PROVENTIL HFA;VENTOLIN HFA) 108 (90 Base) MCG/ACT inhaler Inhale 2 puffs into the lungs every 6 (six) hours as needed for wheezing or shortness of breath. 3 Inhaler 3  . albuterol (PROVENTIL) (2.5 MG/3ML) 0.083% nebulizer  solution Take 3 mLs (2.5 mg total) by nebulization every 6 (six) hours as needed for wheezing or shortness of breath. 150 mL 3  . allopurinol (ZYLOPRIM) 300 MG tablet Take 1 tablet (300 mg total) by mouth daily. (Patient taking differently: Take 300 mg by mouth every evening. ) 90 tablet 3  . amLODipine (NORVASC) 10 MG tablet take 1 tablet by mouth once daily (Patient taking differently: Take 10 mg by mouth daily) 30 tablet 11  . aspirin 81 MG chewable tablet Chew 1 tablet (81 mg total) by mouth daily. 30 tablet 10  . atorvastatin (LIPITOR) 40 MG tablet Take 1 tablet (40 mg total) by mouth daily. 30 tablet 3  . benzonatate (TESSALON) 100 MG capsule Take 100 mg by mouth 3 (three) times daily as needed for cough.  0  . calcitRIOL (ROCALTROL) 0.25 MCG capsule Take 0.25 mcg by mouth daily.  0  . clonazePAM (KLONOPIN) 0.5 MG tablet Take 0.5 mg by mouth daily as needed for anxiety.  0  . cloNIDine (CATAPRES) 0.2 MG tablet Take 1 tablet (0.2 mg total) by mouth 3 (three) times daily. 90 tablet 3  . clopidogrel (PLAVIX) 75 MG tablet Take 1 tablet (75 mg total) by mouth daily. 90 tablet 3  . ezetimibe (ZETIA) 10 MG tablet Take 1 tablet (10 mg total) by mouth daily. 90 tablet 3  . fluticasone (FLONASE) 50 MCG/ACT nasal spray Place 2 sprays into both nostrils daily as needed for allergies or rhinitis. 16 g 3  . Fluticasone-Salmeterol (ADVAIR DISKUS) 250-50 MCG/DOSE AEPB Inhale 1 puff into the lungs 2 (two) times daily. 60 each 3  . furosemide (LASIX) 20 MG tablet Take a total of 60 mg in the  morning and 40 mg at night. (Patient taking differently: Take 80 mg by mouth daily. ) 150 tablet 3  . gabapentin (NEURONTIN) 400 MG capsule take 1 capsule by mouth three times a day (Patient taking differently: Take 400 mg by mouth twice daily) 270 capsule 0  . hydrOXYzine (ATARAX/VISTARIL) 25 MG tablet Take 1 tablet (25 mg total) by mouth 3 (three) times daily. 90 tablet 3  . insulin aspart (NOVOLOG FLEXPEN) 100 UNIT/ML FlexPen Inject 10-30 units into the skin 3 times daily, plus sliding scale as instructed. (Patient taking differently: Inject 10-20 Units into the skin 3 (three) times daily with meals. Inject 10-30 units into the skin 3 times daily, plus sliding scale as instructed.) 30 mL 5  . Insulin Glargine (TOUJEO SOLOSTAR) 300 UNIT/ML SOPN Inject 35 Units into the skin at bedtime. (Patient taking differently: Inject 30 Units into the skin at bedtime. ) 6 pen 5  . isosorbide mononitrate (IMDUR) 60 MG 24 hr tablet Take 2 tablets (120 mg total) by mouth daily. 180 tablet 3  . levothyroxine (SYNTHROID, LEVOTHROID) 25 MCG tablet take 1 tablet by mouth every morning BEFORE BREAKFAST (Patient taking differently: TAKE 25 MCG BY MOUTH EACH MORNING) 30 tablet 2  . loratadine (CLARITIN) 10 MG tablet Take 1 tablet (10 mg total) by mouth daily as needed for allergies. 30 tablet 11  . magnesium citrate SOLN Take 296 mLs (1 Bottle total) by mouth as needed for severe constipation. 195 mL 5  . metoprolol succinate (TOPROL-XL) 50 MG 24 hr tablet Take 3 tablets (150 mg total) by mouth daily. Take with or immediately following a meal. 90 tablet 5  . Naphazoline HCl (CLEAR EYES OP) Place 1 drop into both eyes as needed (for dry eyes).    . nitroGLYCERIN (  NITROSTAT) 0.4 MG SL tablet Place 1 tablet (0.4 mg total) under the tongue every 5 (five) minutes as needed. 30 tablet 2  . prednisoLONE acetate (PRED FORTE) 1 % ophthalmic suspension Place 1 drop into both eyes 3 (three) times daily.  1  . ranitidine (ZANTAC 75) 75  MG tablet Take 1 tablet (75 mg total) by mouth 2 (two) times daily. (Patient taking differently: Take 75 mg by mouth 2 (two) times daily as needed for heartburn. ) 60 tablet 3  . senna-docusate (SENOKOT-S) 8.6-50 MG tablet Take 1 tablet by mouth at bedtime as needed for mild constipation. 30 tablet 0  . simethicone (MYLICON) 992 MG chewable tablet Chew 125 mg by mouth every 6 (six) hours as needed for flatulence.    . triamcinolone ointment (KENALOG) 0.5 % Apply 1 application topically 2 (two) times daily. (Patient taking differently: Apply 1 application topically daily as needed (for skin). ) 30 g 2  . B-D UF III MINI PEN NEEDLES 31G X 5 MM MISC USE TO INJECT INSULIN 4 TIMES A DAY AS INSTRUCTED 100 each 5    Results for orders placed or performed during the hospital encounter of 05/02/16 (from the past 48 hour(s))  Troponin I (q 6hr x 3)     Status: Abnormal   Collection Time: 05/03/16  7:00 PM  Result Value Ref Range   Troponin I 0.60 (HH) <0.03 ng/mL    Comment: CRITICAL RESULT CALLED TO, READ BACK BY AND VERIFIED WITH: K POLING,RN 2043 05/03/2016 WBOND   CK total and CKMB (cardiac)not at [facility]     Status: Abnormal   Collection Time: 05/03/16  7:00 PM  Result Value Ref Range   Total CK 552 (H) 38 - 234 U/L   CK, MB 11.6 (H) 0.5 - 5.0 ng/mL   Relative Index 2.1 0.0 - 2.5  Glucose, capillary     Status: Abnormal   Collection Time: 05/03/16  9:25 PM  Result Value Ref Range   Glucose-Capillary 328 (H) 65 - 99 mg/dL  Basic metabolic panel     Status: Abnormal   Collection Time: 05/04/16 12:44 AM  Result Value Ref Range   Sodium 139 135 - 145 mmol/L   Potassium 5.5 (H) 3.5 - 5.1 mmol/L    Comment: DELTA CHECK NOTED NO VISIBLE HEMOLYSIS    Chloride 106 101 - 111 mmol/L   CO2 21 (L) 22 - 32 mmol/L   Glucose, Bld 312 (H) 65 - 99 mg/dL   BUN 55 (H) 6 - 20 mg/dL   Creatinine, Ser 4.53 (H) 0.44 - 1.00 mg/dL   Calcium 9.8 8.9 - 10.3 mg/dL   GFR calc non Af Amer 10 (L) >60 mL/min   GFR  calc Af Amer 11 (L) >60 mL/min    Comment: (NOTE) The eGFR has been calculated using the CKD EPI equation. This calculation has not been validated in all clinical situations. eGFR's persistently <60 mL/min signify possible Chronic Kidney Disease.    Anion gap 12 5 - 15  Basic metabolic panel     Status: Abnormal   Collection Time: 05/04/16  3:09 AM  Result Value Ref Range   Sodium 138 135 - 145 mmol/L   Potassium 5.8 (H) 3.5 - 5.1 mmol/L    Comment: SPECIMEN HEMOLYZED. HEMOLYSIS MAY AFFECT INTEGRITY OF RESULTS.   Chloride 105 101 - 111 mmol/L   CO2 22 22 - 32 mmol/L   Glucose, Bld 273 (H) 65 - 99 mg/dL   BUN 56 (H)  6 - 20 mg/dL   Creatinine, Ser 4.60 (H) 0.44 - 1.00 mg/dL   Calcium 9.6 8.9 - 10.3 mg/dL   GFR calc non Af Amer 9 (L) >60 mL/min   GFR calc Af Amer 11 (L) >60 mL/min    Comment: (NOTE) The eGFR has been calculated using the CKD EPI equation. This calculation has not been validated in all clinical situations. eGFR's persistently <60 mL/min signify possible Chronic Kidney Disease.    Anion gap 11 5 - 15  CBC     Status: Abnormal   Collection Time: 05/04/16  3:09 AM  Result Value Ref Range   WBC 14.9 (H) 4.0 - 10.5 K/uL   RBC 2.95 (L) 3.87 - 5.11 MIL/uL   Hemoglobin 9.0 (L) 12.0 - 15.0 g/dL   HCT 27.7 (L) 36.0 - 46.0 %   MCV 93.9 78.0 - 100.0 fL   MCH 30.5 26.0 - 34.0 pg   MCHC 32.5 30.0 - 36.0 g/dL   RDW 15.8 (H) 11.5 - 15.5 %   Platelets 152 150 - 400 K/uL  Glucose, capillary     Status: Abnormal   Collection Time: 05/04/16  7:35 AM  Result Value Ref Range   Glucose-Capillary 218 (H) 65 - 99 mg/dL  Culture, respiratory (NON-Expectorated)     Status: None (Preliminary result)   Collection Time: 05/04/16 10:14 AM  Result Value Ref Range   Specimen Description TRACHEAL ASPIRATE    Special Requests NONE    Gram Stain      MODERATE WBC PRESENT,BOTH PMN AND MONONUCLEAR NO ORGANISMS SEEN    Culture NO GROWTH < 24 HOURS    Report Status PENDING   Basic  metabolic panel     Status: Abnormal   Collection Time: 05/04/16 10:15 AM  Result Value Ref Range   Sodium 138 135 - 145 mmol/L   Potassium 4.8 3.5 - 5.1 mmol/L    Comment: DELTA CHECK NOTED   Chloride 105 101 - 111 mmol/L   CO2 23 22 - 32 mmol/L   Glucose, Bld 310 (H) 65 - 99 mg/dL   BUN 58 (H) 6 - 20 mg/dL   Creatinine, Ser 4.73 (H) 0.44 - 1.00 mg/dL   Calcium 9.6 8.9 - 10.3 mg/dL   GFR calc non Af Amer 9 (L) >60 mL/min   GFR calc Af Amer 11 (L) >60 mL/min    Comment: (NOTE) The eGFR has been calculated using the CKD EPI equation. This calculation has not been validated in all clinical situations. eGFR's persistently <60 mL/min signify possible Chronic Kidney Disease.    Anion gap 10 5 - 15  Magnesium     Status: None   Collection Time: 05/04/16 10:15 AM  Result Value Ref Range   Magnesium 2.4 1.7 - 2.4 mg/dL  Phosphorus     Status: Abnormal   Collection Time: 05/04/16 10:15 AM  Result Value Ref Range   Phosphorus 5.3 (H) 2.5 - 4.6 mg/dL  Triglycerides     Status: Abnormal   Collection Time: 05/04/16 10:15 AM  Result Value Ref Range   Triglycerides 171 (H) <150 mg/dL  Glucose, capillary     Status: Abnormal   Collection Time: 05/04/16 10:15 AM  Result Value Ref Range   Glucose-Capillary 273 (H) 65 - 99 mg/dL   Comment 1 Notify RN    Comment 2 Document in Chart   Glucose, capillary     Status: Abnormal   Collection Time: 05/04/16 11:17 AM  Result Value Ref Range  Glucose-Capillary 287 (H) 65 - 99 mg/dL   Comment 1 Notify RN    Comment 2 Document in Chart   I-STAT 3, arterial blood gas (G3+)     Status: Abnormal   Collection Time: 05/04/16 12:01 PM  Result Value Ref Range   pH, Arterial 7.164 (LL) 7.350 - 7.450   pCO2 arterial 62.7 (H) 32.0 - 48.0 mmHg   pO2, Arterial 68.0 (L) 83.0 - 108.0 mmHg   Bicarbonate 22.6 20.0 - 28.0 mmol/L   TCO2 24 0 - 100 mmol/L   O2 Saturation 87.0 %   Acid-base deficit 6.0 (H) 0.0 - 2.0 mmol/L   Patient temperature 98.6 F     Collection site RADIAL, ALLEN'S TEST ACCEPTABLE    Drawn by RT    Sample type ARTERIAL    Comment NOTIFIED PHYSICIAN   Magnesium     Status: None   Collection Time: 05/04/16  4:58 PM  Result Value Ref Range   Magnesium 2.1 1.7 - 2.4 mg/dL  Phosphorus     Status: None   Collection Time: 05/04/16  4:58 PM  Result Value Ref Range   Phosphorus 3.4 2.5 - 4.6 mg/dL  Glucose, capillary     Status: Abnormal   Collection Time: 05/04/16  5:02 PM  Result Value Ref Range   Glucose-Capillary 253 (H) 65 - 99 mg/dL   Comment 1 Notify RN    Comment 2 Document in Chart   Glucose, capillary     Status: Abnormal   Collection Time: 05/04/16  7:41 PM  Result Value Ref Range   Glucose-Capillary 280 (H) 65 - 99 mg/dL  Glucose, capillary     Status: Abnormal   Collection Time: 05/04/16 11:46 PM  Result Value Ref Range   Glucose-Capillary 270 (H) 65 - 99 mg/dL   Comment 1 Notify RN   Glucose, capillary     Status: Abnormal   Collection Time: 05/05/16  3:38 AM  Result Value Ref Range   Glucose-Capillary 241 (H) 65 - 99 mg/dL   Comment 1 Notify RN   Procalcitonin     Status: None   Collection Time: 05/05/16  4:54 AM  Result Value Ref Range   Procalcitonin 14.61 ng/mL    Comment:        Interpretation: PCT >= 10 ng/mL: Important systemic inflammatory response, almost exclusively due to severe bacterial sepsis or septic shock. (NOTE)         ICU PCT Algorithm               Non ICU PCT Algorithm    ----------------------------     ------------------------------         PCT < 0.25 ng/mL                 PCT < 0.1 ng/mL     Stopping of antibiotics            Stopping of antibiotics       strongly encouraged.               strongly encouraged.    ----------------------------     ------------------------------       PCT level decrease by               PCT < 0.25 ng/mL       >= 80% from peak PCT       OR PCT 0.25 - 0.5 ng/mL          Stopping of antibiotics  encouraged.     Stopping of antibiotics           encouraged.    ----------------------------     ------------------------------       PCT level decrease by              PCT >= 0.25 ng/mL       < 80% from peak PCT        AND PCT >= 0.5 ng/mL             Continuing antibiotics                                              encouraged.       Continuing antibiotics            encouraged.    ----------------------------     ------------------------------     PCT level increase compared          PCT > 0.5 ng/mL         with peak PCT AND          PCT >= 0.5 ng/mL             Escalation of antibiotics                                          strongly encouraged.      Escalation of antibiotics        strongly encouraged.   CBC     Status: Abnormal   Collection Time: 05/05/16  4:54 AM  Result Value Ref Range   WBC 8.6 4.0 - 10.5 K/uL   RBC 2.53 (L) 3.87 - 5.11 MIL/uL   Hemoglobin 7.8 (L) 12.0 - 15.0 g/dL   HCT 24.0 (L) 36.0 - 46.0 %   MCV 94.9 78.0 - 100.0 fL   MCH 30.8 26.0 - 34.0 pg   MCHC 32.5 30.0 - 36.0 g/dL   RDW 15.9 (H) 11.5 - 15.5 %   Platelets 131 (L) 150 - 400 K/uL  Basic metabolic panel     Status: Abnormal   Collection Time: 05/05/16  4:54 AM  Result Value Ref Range   Sodium 139 135 - 145 mmol/L   Potassium 5.1 3.5 - 5.1 mmol/L   Chloride 107 101 - 111 mmol/L   CO2 23 22 - 32 mmol/L   Glucose, Bld 247 (H) 65 - 99 mg/dL   BUN 76 (H) 6 - 20 mg/dL   Creatinine, Ser 5.55 (H) 0.44 - 1.00 mg/dL   Calcium 8.8 (L) 8.9 - 10.3 mg/dL   GFR calc non Af Amer 7 (L) >60 mL/min   GFR calc Af Amer 9 (L) >60 mL/min    Comment: (NOTE) The eGFR has been calculated using the CKD EPI equation. This calculation has not been validated in all clinical situations. eGFR's persistently <60 mL/min signify possible Chronic Kidney Disease.    Anion gap 9 5 - 15  Magnesium     Status: None   Collection Time: 05/05/16  4:54 AM  Result Value Ref Range   Magnesium 2.2 1.7 - 2.4 mg/dL   Phosphorus     Status: Abnormal   Collection Time: 05/05/16  4:54 AM  Result Value Ref Range   Phosphorus 2.0 (L)  2.5 - 4.6 mg/dL  Glucose, capillary     Status: Abnormal   Collection Time: 05/05/16  7:38 AM  Result Value Ref Range   Glucose-Capillary 260 (H) 65 - 99 mg/dL   Comment 1 Notify RN    Comment 2 Document in Chart   Glucose, capillary     Status: Abnormal   Collection Time: 05/05/16 12:05 PM  Result Value Ref Range   Glucose-Capillary 309 (H) 65 - 99 mg/dL   Comment 1 Notify RN    Comment 2 Document in Chart    Dg Chest Portable 1 View  Result Date: 05/05/2016 CLINICAL DATA:  Respiratory failure EXAM: PORTABLE CHEST 1 VIEW COMPARISON:  May 04, 2016 FINDINGS: Support apparatus is stable. No pneumothorax. Infiltrate throughout the right lung is similar in the interval. There has been improvement on the left. No other changes. IMPRESSION: 1. Stable support apparatus. 2. Diffuse infiltrate on the right is similar in the interval. There has been improvement in the left infiltrate. Electronically Signed   By: Dorise Bullion III M.D   On: 05/05/2016 08:14   Dg Chest Port 1 View  Result Date: 05/04/2016 CLINICAL DATA:  Verify ET tube placement and OG tube placement EXAM: PORTABLE CHEST 1 VIEW COMPARISON:  05/04/2016 FINDINGS: ET tube tip is above the carina. The OG tube tip is within the stomach below the field of view. Stable cardiac enlargement. Previous median sternotomy and CABG procedure. Bilateral airspace opacities are unchanged from previous exam. IMPRESSION: 1. No change in aeration to lungs compared with previous exam. 2. Satisfactory position of ET tube and OG tube. Electronically Signed   By: Kerby Moors M.D.   On: 05/04/2016 09:51   Dg Chest Port 1 View  Result Date: 05/04/2016 CLINICAL DATA:  Hypoxia EXAM: PORTABLE CHEST 1 VIEW COMPARISON:  05/02/2016 FINDINGS: Post sternotomy changes are again evident. There is mild to moderate cardiomegaly. Slightly improved  aeration of the left thorax. Slight worsening of air space disease in the right hemithorax. Possible small effusions. No pneumothorax. IMPRESSION: Cardiomegaly unchanged. Slightly improved aeration of the left thorax with residual infiltrate or edema in the left lung base and end oval opacity in the left mid to lower lung. Slightly worsened airspace disease in the right thorax which may represent edema or pneumonia. Electronically Signed   By: Donavan Foil M.D.   On: 05/04/2016 04:18   Dg Abd Portable 1v  Result Date: 05/04/2016 CLINICAL DATA:  Orogastric tube placement confirmation. EXAM: PORTABLE ABDOMEN - 1 VIEW COMPARISON:  None. FINDINGS: An orogastric tube extends below the diaphragm, through the stomach and with its tip positioned in the proximal duodenum at the level of the duodenal bulb. Underlying bowel gas shows no evidence of overt obstruction or significant ileus. IMPRESSION: Gastric tube extends through the stomach and its tip is positioned at the level of the duodenal bulb. Electronically Signed   By: Aletta Edouard M.D.   On: 05/04/2016 09:53    ROS: not obtainable due to mental status  Blood pressure 122/60, pulse 80, temperature (!) 100.4 F (38 C), temperature source Oral, resp. rate (!) 24, height _0  (1.778 m), weight 109.4 kg (241 lb 2.9 oz), last menstrual period 10/13/2014, SpO2 100 %. Physical Exam  GEN intubated, sedated HEENT PERRL, sclerae anicteric NECK +JVD PULM diffuse rhonchrous breath sounds bilaterally CV tachycardic, soft systolic murmur ABD soft, hypoactive bowel sounds, nondistended, nontender EXT 2+ LE edema NEURO does not respond to commands, responsive to sternal rub but no  purposeful movements Does not appear to have HD access   Assessment/Plan 1.  Acute kidney injury superimposed on CKD V: AKI, possible progression to ESRD.  Will attempt Lasix 160 IV TID and if no good response today, will begin RRT tomorrow if family in agreement.    2.   Hyperkalemia: mild, being medically managed, now in the low 5s  3.  Vol overload: Lasix as above  4.  Severe sepsis, possible PNA: on vanc/ meropenem.  No blood cultures.  Sputum culture pending.    5.  Acute hypoxic respiratory failure: intubated, per CCM.  Lung protective ventilation.    6.  Acute systolic CHF exacerbation; EF 35-40% 05/04/16.    7.  HTN: on clonidine and metoprolol   Madelon Lips, MD [facility] 05/05/2016, 3:51 PM

## 2016-05-06 ENCOUNTER — Inpatient Hospital Stay (HOSPITAL_COMMUNITY): Payer: Medicare Other

## 2016-05-06 DIAGNOSIS — N185 Chronic kidney disease, stage 5: Secondary | ICD-10-CM

## 2016-05-06 LAB — GLUCOSE, CAPILLARY
GLUCOSE-CAPILLARY: 341 mg/dL — AB (ref 65–99)
Glucose-Capillary: 251 mg/dL — ABNORMAL HIGH (ref 65–99)
Glucose-Capillary: 287 mg/dL — ABNORMAL HIGH (ref 65–99)
Glucose-Capillary: 291 mg/dL — ABNORMAL HIGH (ref 65–99)
Glucose-Capillary: 297 mg/dL — ABNORMAL HIGH (ref 65–99)
Glucose-Capillary: 330 mg/dL — ABNORMAL HIGH (ref 65–99)
Glucose-Capillary: 357 mg/dL — ABNORMAL HIGH (ref 65–99)

## 2016-05-06 LAB — CBC
HCT: 23.5 % — ABNORMAL LOW (ref 36.0–46.0)
Hemoglobin: 7.5 g/dL — ABNORMAL LOW (ref 12.0–15.0)
MCH: 30.4 pg (ref 26.0–34.0)
MCHC: 31.9 g/dL (ref 30.0–36.0)
MCV: 95.1 fL (ref 78.0–100.0)
Platelets: 121 10*3/uL — ABNORMAL LOW (ref 150–400)
RBC: 2.47 MIL/uL — AB (ref 3.87–5.11)
RDW: 15.7 % — ABNORMAL HIGH (ref 11.5–15.5)
WBC: 7.1 10*3/uL (ref 4.0–10.5)

## 2016-05-06 LAB — PHOSPHORUS: PHOSPHORUS: 3.3 mg/dL (ref 2.5–4.6)

## 2016-05-06 LAB — BASIC METABOLIC PANEL
ANION GAP: 15 (ref 5–15)
Anion gap: 11 (ref 5–15)
BUN: 83 mg/dL — ABNORMAL HIGH (ref 6–20)
BUN: 84 mg/dL — ABNORMAL HIGH (ref 6–20)
CALCIUM: 8.6 mg/dL — AB (ref 8.9–10.3)
CHLORIDE: 107 mmol/L (ref 101–111)
CO2: 18 mmol/L — ABNORMAL LOW (ref 22–32)
CO2: 19 mmol/L — ABNORMAL LOW (ref 22–32)
CREATININE: 5.67 mg/dL — AB (ref 0.44–1.00)
Calcium: 8.8 mg/dL — ABNORMAL LOW (ref 8.9–10.3)
Chloride: 106 mmol/L (ref 101–111)
Creatinine, Ser: 5.85 mg/dL — ABNORMAL HIGH (ref 0.44–1.00)
GFR, EST AFRICAN AMERICAN: 8 mL/min — AB (ref >60)
GFR, EST AFRICAN AMERICAN: 8 mL/min — AB (ref >60)
GFR, EST NON AFRICAN AMERICAN: 7 mL/min — AB (ref >60)
GFR, EST NON AFRICAN AMERICAN: 7 mL/min — AB (ref >60)
Glucose, Bld: 352 mg/dL — ABNORMAL HIGH (ref 65–99)
Glucose, Bld: 364 mg/dL — ABNORMAL HIGH (ref 65–99)
POTASSIUM: 4.4 mmol/L (ref 3.5–5.1)
Potassium: 4.6 mmol/L (ref 3.5–5.1)
SODIUM: 137 mmol/L (ref 135–145)
SODIUM: 139 mmol/L (ref 135–145)

## 2016-05-06 LAB — CULTURE, RESPIRATORY W GRAM STAIN: Culture: NO GROWTH

## 2016-05-06 LAB — IRON AND TIBC
IRON: 25 ug/dL — AB (ref 28–170)
SATURATION RATIOS: 12 % (ref 10.4–31.8)
TIBC: 202 ug/dL — AB (ref 250–450)
UIBC: 177 ug/dL

## 2016-05-06 LAB — MAGNESIUM: MAGNESIUM: 2.2 mg/dL (ref 1.7–2.4)

## 2016-05-06 MED ORDER — INSULIN GLARGINE 100 UNIT/ML ~~LOC~~ SOLN
45.0000 [IU] | Freq: Every day | SUBCUTANEOUS | Status: DC
  Administered 2016-05-06: 45 [IU] via SUBCUTANEOUS
  Filled 2016-05-06 (×2): qty 0.45

## 2016-05-06 MED ORDER — SODIUM CHLORIDE 0.9 % IV SOLN
INTRAVENOUS | Status: DC
  Administered 2016-05-06: 12:00:00 via INTRAVENOUS
  Administered 2016-05-07: 10 mL/h via INTRAVENOUS
  Administered 2016-05-10: 09:00:00 via INTRAVENOUS

## 2016-05-06 MED ORDER — HEPARIN SODIUM (PORCINE) 5000 UNIT/ML IJ SOLN
5000.0000 [IU] | Freq: Three times a day (TID) | INTRAMUSCULAR | Status: DC
  Administered 2016-05-07 – 2016-05-09 (×8): 5000 [IU] via SUBCUTANEOUS
  Filled 2016-05-06 (×10): qty 1

## 2016-05-06 MED ORDER — DARBEPOETIN ALFA 200 MCG/0.4ML IJ SOSY
200.0000 ug | PREFILLED_SYRINGE | INTRAMUSCULAR | Status: DC
  Administered 2016-05-06: 200 ug via INTRAVENOUS
  Filled 2016-05-06: qty 0.4

## 2016-05-06 MED ORDER — SODIUM CHLORIDE 0.9 % IV SOLN: 500.0000 mg | Freq: Every day | INTRAVENOUS | Status: DC

## 2016-05-06 MED ORDER — DARBEPOETIN ALFA 200 MCG/0.4ML IJ SOSY
PREFILLED_SYRINGE | INTRAMUSCULAR | Status: AC
  Filled 2016-05-06: qty 0.4

## 2016-05-06 MED ORDER — HEPARIN SODIUM (PORCINE) 1000 UNIT/ML IJ SOLN
2.4000 mL | Freq: Once | INTRAMUSCULAR | Status: AC
  Administered 2016-05-06: 2400 [IU] via INTRAVENOUS
  Filled 2016-05-06: qty 3

## 2016-05-06 MED ORDER — INSULIN ASPART 100 UNIT/ML ~~LOC~~ SOLN
7.0000 [IU] | SUBCUTANEOUS | Status: DC
  Administered 2016-05-06 – 2016-05-07 (×7): 7 [IU] via SUBCUTANEOUS

## 2016-05-06 NOTE — Procedures (Signed)
Central Venous dialysis Catheter Insertion Procedure Note MARLYNE TOTARO [id number] 1956/01/25  Procedure: Insertion of Central Venous Catheter Indications: intermittent HD  Procedure Details Consent: Risks of procedure as well as the alternatives and risks of each were explained to the (patient/caregiver).  Consent for procedure obtained. Time Out: Verified patient identification, verified procedure, site/side was marked, verified correct patient position, special equipment/implants available, medications/allergies/relevent history reviewed, required imaging and test results available.   Maximum sterile technique was used including antiseptics, cap, gloves, gown, hand hygiene, mask and sheet. Skin prep: Chlorhexidine; local anesthetic administered A antimicrobial bonded/coated triple lumen catheter was placed in the right internal Jugular vein using the Seldinger technique.  Evaluation Blood flow good Complications: No apparent complications Patient did tolerate procedure well. Chest X-ray ordered to verify placement.  CXR: pending.  Clementeen Graham 05/06/2016, 1:27 PM  Erick Colace ACNP-BC [place] Pager # [phone] OR # [phone] if no answer

## 2016-05-06 NOTE — Consult Note (Signed)
PULMONARY / CRITICAL CARE MEDICINE   Name: Brooke Stafford MRN: [id number] DOB: 1955-09-13    ADMISSION DATE:  05/02/2016 CONSULTATION DATE:  1/20  REFERRING MD:  Wendee Beavers ([facility])   CHIEF COMPLAINT:  Respiratory failure   HISTORY OF PRESENT ILLNESS:   61yo female with hx CAD s/p CABG 2002, CKD V (not yet on HD), HTN, CHF (EF 40-45%) presented 1/18 with 1 week hx URI symptoms with worsening productive cough, SOB.  In ER was hypoxic with sats 60's, febrile, placed on bipap.  She was admitted by [facility] with PNA v pulmonary edema. She was started on IV abx and aggressive diuresis without significant improvement.  She continued to require bipap and on the am of 1/20 had significantly increased WOB, minimal UOP despite multiple doses lasix and PCCM consulted.     SUBJECTIVE:  Started on aggressive IV diuresis yesterday with minimal output and no improvement in creatinine and bun.  VITAL SIGNS: BP 124/62   Pulse 79   Temp 99.5 F (37.5 C) (Oral)   Resp (!) 24   Ht 5\' 10"  (1.778 m)   Wt 111.5 kg (245 lb 13 oz)   LMP 10/13/2014 (Exact Date)   SpO2 100%   BMI 35.27 kg/m   HEMODYNAMICS:    VENTILATOR SETTINGS: Vent Mode: PRVC FiO2 (%):  [40 %] 40 % Set Rate:  [24 bmp] 24 bmp Vt Set:  [550 mL] 550 mL PEEP:  [5 cmH20] 5 cmH20 Plateau Pressure:  [18 cmH20-25 cmH20] 23 cmH20  INTAKE / OUTPUT: I/O last 3 completed shifts: In: 3087.5 [I.V.:452.2; Other:350; NG/GT:1800; IV Piggyback:485.3] Out: 1600 [Urine:1300; Emesis/NG output:300]  PHYSICAL EXAMINATION:  General:  Chronically ill appearing female,  Neuro:  Responsive to verbal commands, nods head to questions HEENT:  [place]/AT, no JVD  Cardiovascular:  RRR, no m/g/r Lungs: coarse breath sounds R>L  Abdomen:  Round, soft, non tender  Musculoskeletal:  Warm and dry, 2+ pitting edema BLE   LABS:  BMET  Recent Labs Lab 05/05/16 0454 05/06/16 0023 05/06/16 0221  NA 139 137 139  K 5.1 4.6 4.4  CL 107 107 106  CO2 23 19* 18*   BUN 76* 84* 83*  CREATININE 5.55* 5.67* 5.85*  GLUCOSE 247* 364* 352*    Electrolytes  Recent Labs Lab 05/05/16 0454 05/05/16 1627 05/06/16 0023 05/06/16 0221  CALCIUM 8.8*  --  8.6* 8.8*  MG 2.2 2.2 2.2  --   PHOS 2.0* 2.2* 3.3  --     CBC  Recent Labs Lab 05/04/16 0309 05/05/16 0454 05/06/16 0221  WBC 14.9* 8.6 7.1  HGB 9.0* 7.8* 7.5*  HCT 27.7* 24.0* 23.5*  PLT 152 131* 121*    Coag's No results for input(s): APTT, INR in the last 168 hours.  Sepsis Markers  Recent Labs Lab 05/03/16 0328 05/05/16 0454  PROCALCITON 0.69 14.61    ABG  Recent Labs Lab 05/04/16 1201  PHART 7.164*  PCO2ART 62.7*  PO2ART 68.0*    Liver Enzymes No results for input(s): AST, ALT, ALKPHOS, BILITOT, ALBUMIN in the last 168 hours.  Cardiac Enzymes  Recent Labs Lab 05/03/16 1900  TROPONINI 0.60*    Glucose  Recent Labs Lab 05/05/16 1205 05/05/16 1702 05/05/16 1945 05/06/16 0049 05/06/16 0330 05/06/16 0719  GLUCAP 309* 290* 312* 357* 341* 297*    Imaging Dg Chest Port 1 View  Result Date: 05/06/2016 CLINICAL DATA:  Pneumonia. EXAM: PORTABLE CHEST 1 VIEW COMPARISON:  05/05/2016. FINDINGS: Endotracheal tube and NG tube in stable  position. Prior CABG. Diffuse bilateral airspace disease again noted. No significant change. Low lung volumes. Small left pleural effusion cannot be excluded. No pneumothorax. IMPRESSION: 1. Lines and tubes in stable position. 2. Prior CABG with cardiomegaly. Diffuse bilateral airspace disease/pulmonary edema again noted. No significant change. Small left effusion cannot be excluded . Electronically Signed   By: Marcello Moores  Register   On: 05/06/2016 07:07     STUDIES:  2D echo 1/20>>> LV EF: 35% -   58%, grade 2 diastolic dysfunction  CULTURES: RVP 1/19>>> NEG RVP (from tracheal aspirate) 1/20>>>no growth Sputum 1/20>>> no growth  ANTIBIOTICS: azithro 1/20>>>1/20 Vanc 1/20>>  Imipenem 1/20>>>  SIGNIFICANT  EVENTS:   LINES/TUBES: ETT 1/20>>>  DISCUSSION: 61yo female with acute respiratory failure in setting acute on chronic CHF, CKD, PNA. Failed bipap, intubated 1/20.   ASSESSMENT / PLAN:  PULMONARY Acute hypoxic respiratory failure - multifactorial in setting probable PNA and acute on chronic CHF with underling COPD.  Did not respond to diuresis.  Worsening CXR. Failed bipap.  P:    Vent support  On broad spectrum abx Sputum culture >> no growth BD's  CARDIOVASCULAR Acute on chronic combined CHF  Hx CAD - s/p CABG 2002, PCI 2016 and 2016 HTN P:    Lasix 160 TID Asa, statin, plavix  Clonidine, amlodipine  RENAL Acute on CKD V - baseline Scr ~4 Hyperkalemia - resolved P:   Replace lytes as needed 160 IV lasix TID, may need CRRT   Appreciate nephrology's recommendations  GASTROINTESTINAL No active issue  P:   NPO   PPI   HEMATOLOGIC Thrombocytopenia - in setting of acute illness  P:  lovenox  F/u CBC   INFECTIOUS ?PNA  Elevated PCT - 14.61 P:   On vancomycin and cefepime  Trend pct  Sputum culture >> No growth  Respiratory panel > non detected  ENDOCRINE DM  hypothyroid  P:   SSI, lantus  Continue synthroid   NEUROLOGIC Sedation needs on vent  P:   RASS goal: -1 Propofol    FAMILY  - Updates:  Sister and boyfriend updated at bedside 1/22  - Inter-disciplinary family meet or Palliative Care meeting due by: 1/27

## 2016-05-06 NOTE — Progress Notes (Signed)
Pharmacy Antibiotic Note  Brooke Stafford is a 61 y.o. female admitted on 05/02/2016 with pneumonia.  Pharmacy has been consulted for meropenem dosing. Patient has penicillin allerg.   Patient was previously being treated for URI/COPD flare with azithromycin and antibiotics were broadened given decline. Patient currently febrile with Tmax 100.36F. WBC trending down to 14.9 >> 7.1. Respiratory panel was negative. Patient is CKD V with plans to start HD today.  Plan: Change meropenem to 500mg  q24 hours scheduled at bedtime Stop vancomycin Monitor renal fx, cultures, HD schedule  Height: 5\' 10"  (177.8 cm) Weight: 245 lb 13 oz (111.5 kg) IBW/kg (Calculated) : 68.5  Temp (24hrs), Avg:99.8 F (37.7 C), Min:98.8 F (37.1 C), Max:100.7 F (38.2 C)   Recent Labs Lab 05/02/16 2145  05/03/16 0328  05/04/16 0309 05/04/16 1015 05/05/16 0454 05/06/16 0023 05/06/16 0221  WBC 7.9  --  15.6*  --  14.9*  --  8.6  --  7.1  CREATININE 3.92*  < >  --   < > 4.60* 4.73* 5.55* 5.67* 5.85*  < > = values in this interval not displayed.  Estimated Creatinine Clearance: 13.7 mL/min (by C-G formula based on SCr of 5.85 mg/dL (H)).    Allergies  Allergen Reactions  . Digoxin And Related Other (See Comments)    Patient stated she almost died. Had flu like symptoms as well as diarrhea.  . Hydralazine Shortness Of Breath  . Lisinopril Other (See Comments)    Felt like she had the flu. Was very sick!!!  . Penicillins Cross Reactors Hives    And high fever Has patient had a PCN reaction causing immediate rash, facial/tongue/throat swelling, SOB or lightheadedness with hypotension: No Has patient had a PCN reaction causing severe rash involving mucus membranes or skin necrosis: Yes Has patient had a PCN reaction that required hospitalization: Unk Has patient had a PCN reaction occurring within the last 10 years: Unk If all of the above answers are "NO", then may proceed with Cephalosporin use.   .  Adhesive [Tape] Rash    bruising    Antimicrobials this admission: 1/19 azith x1 1/18 ctx x1 Vancomycin 1/20>>1/22 Meropenem 1/20>>  Dose adjustments this admission: none  Microbiology results: 1/29 mrsa pcr: neg 1/19 rvp: neg 1/20 resp cx: ngtd MRSA neg  Thank you for allowing pharmacy to be a part of this patient's care.  Myer Peer Grayland Ormond), PharmD  PGY1 Pharmacy Resident Pager: [phone] 05/06/2016 1:22 PM

## 2016-05-06 NOTE — Progress Notes (Signed)
[facility] Rounding Note  Subjective/Interval History:   Remains intubated  1 liter UOP to lasix challenge Still very wet, creatinine continues to rise. + nauseous  Objective Vital signs in last 24 hours: Vitals:   05/06/16 0600 05/06/16 0700 05/06/16 0721 05/06/16 0800  BP: 133/65 124/62  134/66  Pulse: 80 79  82  Resp: (!) 24 (!) 24  (!) 24  Temp:   99.5 F (37.5 C)   TempSrc:   Oral   SpO2: 100% 100%  100%  Weight:      Height:       Weight change: 2.1 kg (4 lb 10.1 oz)  Intake/Output Summary (Last 24 hours) at 05/06/16 0906 Last data filed at 05/06/16 0800  Gross per 24 hour  Intake          2035.65 ml  Output             1450 ml  Net           585.65 ml   Physical Exam:  Blood pressure 134/66, pulse 82, temperature 99.5 F (37.5 C), temperature source Oral, resp. rate (!) 24, height '5\' 10"'  (1.778 m), weight 111.5 kg (245 lb 13 oz), last menstrual period 10/13/2014, SpO2 100 %.  Physical Exam  GEN intubated, but sedation off, awakens appropriately - discussed HD with RN present and pt agrees to proceed HEENT PERRL, sclerae anicteric NECK +JVD PULM diffuse rhonchrous breath sounds bilaterally CV tachycardic, soft systolic murmur ABD soft, hypoactive bowel sounds, nondistended, nontender EXT 2+ LE edema Neuro - Awake, slow to respond (usual), appropriate, agrees to HD and appears to understand discussioni  Labs:   Recent Labs Lab 05/02/16 2145 05/02/16 2152 05/04/16 0044 05/04/16 0309 05/04/16 1015 05/04/16 1658 05/05/16 0454 05/05/16 1627 05/06/16 0023 05/06/16 0221  NA 143 145 139 138 138  --  139  --  137 139  K 3.4* 3.4* 5.5* 5.8* 4.8  --  5.1  --  4.6 4.4  CL 107 110 106 105 105  --  107  --  107 106  CO2 19*  --  21* 22 23  --  23  --  19* 18*  GLUCOSE 190* 187* 312* 273* 310*  --  247*  --  364* 352*  BUN 35* 35* 55* 56* 58*  --  76*  --  84* 83*  CREATININE 3.92* 4.10* 4.53* 4.60* 4.73*  --  5.55*  --  5.67* 5.85*  CALCIUM 9.8  --  9.8 9.6 9.6  --   8.8*  --  8.6* 8.8*  PHOS  --   --   --   --  5.3* 3.4 2.0* 2.2* 3.3  --      Recent Labs Lab 05/02/16 2145  05/03/16 0328 05/04/16 0309 05/05/16 0454 05/06/16 0221  WBC 7.9  --  15.6* 14.9* 8.6 7.1  NEUTROABS 4.5  --   --   --   --   --   HGB 11.9*  < > 10.7* 9.0* 7.8* 7.5*  HCT 35.8*  < > 33.2* 27.7* 24.0* 23.5*  MCV 93.2  --  93.5 93.9 94.9 95.1  PLT 184  --  175 152 131* 121*  < > = values in this interval not displayed.  ) Recent Labs Lab 05/03/16 1900  CKTOTAL 552*  CKMB 11.6*  TROPONINI 0.60*     Recent Labs Lab 05/05/16 1702 05/05/16 1945 05/06/16 0049 05/06/16 0330 05/06/16 0719  GLUCAP 290* 312* 357* 341* 297*  Studies/Results: Dg Chest Port 1 View  Result Date: 05/06/2016 CLINICAL DATA:  Pneumonia. EXAM: PORTABLE CHEST 1 VIEW COMPARISON:  05/05/2016. FINDINGS: Endotracheal tube and NG tube in stable position. Prior CABG. Diffuse bilateral airspace disease again noted. No significant change. Low lung volumes. Small left pleural effusion cannot be excluded. No pneumothorax. IMPRESSION: 1. Lines and tubes in stable position. 2. Prior CABG with cardiomegaly. Diffuse bilateral airspace disease/pulmonary edema again noted. No significant change. Small left effusion cannot be excluded . Electronically Signed   By: Marcello Moores  Register   On: 05/06/2016 07:07   Dg Chest Portable 1 View  Result Date: 05/05/2016 CLINICAL DATA:  Respiratory failure EXAM: PORTABLE CHEST 1 VIEW COMPARISON:  May 04, 2016 FINDINGS: Support apparatus is stable. No pneumothorax. Infiltrate throughout the right lung is similar in the interval. There has been improvement on the left. No other changes. IMPRESSION: 1. Stable support apparatus. 2. Diffuse infiltrate on the right is similar in the interval. There has been improvement in the left infiltrate. Electronically Signed   By: Dorise Bullion III M.D   On: 05/05/2016 08:14   Dg Chest Port 1 View  Result Date: 05/04/2016 CLINICAL DATA:   Verify ET tube placement and OG tube placement EXAM: PORTABLE CHEST 1 VIEW COMPARISON:  05/04/2016 FINDINGS: ET tube tip is above the carina. The OG tube tip is within the stomach below the field of view. Stable cardiac enlargement. Previous median sternotomy and CABG procedure. Bilateral airspace opacities are unchanged from previous exam. IMPRESSION: 1. No change in aeration to lungs compared with previous exam. 2. Satisfactory position of ET tube and OG tube. Electronically Signed   By: Kerby Moors M.D.   On: 05/04/2016 09:51   Dg Abd Portable 1v  Result Date: 05/04/2016 CLINICAL DATA:  Orogastric tube placement confirmation. EXAM: PORTABLE ABDOMEN - 1 VIEW COMPARISON:  None. FINDINGS: An orogastric tube extends below the diaphragm, through the stomach and with its tip positioned in the proximal duodenum at the level of the duodenal bulb. Underlying bowel gas shows no evidence of overt obstruction or significant ileus. IMPRESSION: Gastric tube extends through the stomach and its tip is positioned at the level of the duodenal bulb. Electronically Signed   By: Aletta Edouard M.D.   On: 05/04/2016 09:53   Medications: . feeding supplement (VITAL HIGH PROTEIN) 1,000 mL (05/06/16 0538)  . propofol (DIPRIVAN) infusion 15 mcg/kg/min (05/06/16 0535)   . amLODipine  10 mg Oral Daily  . aspirin  81 mg Oral Daily  . atorvastatin  40 mg Oral Daily  . chlorhexidine gluconate (MEDLINE KIT)  15 mL Mouth Rinse BID  . cloNIDine  0.2 mg Oral TID  . clopidogrel  75 mg Oral QHS  . enoxaparin (LOVENOX) injection  30 mg Subcutaneous Q24H  . ezetimibe  10 mg Oral Daily  . feeding supplement (PRO-STAT SUGAR FREE 64)  30 mL Per Tube BID  . furosemide  160 mg Intravenous Q8H  . insulin aspart  0-15 Units Subcutaneous Q4H  . insulin aspart  5 Units Subcutaneous Q4H  . insulin glargine  35 Units Subcutaneous QHS  . ipratropium-albuterol  3 mL Nebulization QID  . levothyroxine  25 mcg Oral QAC breakfast  . mouth  rinse  15 mL Mouth Rinse QID  . meropenem (MERREM) IV  1 g Intravenous Q24H  . metoprolol tartrate  100 mg Per Tube BID  . mometasone-formoterol  2 puff Inhalation BID  . pantoprazole sodium  40 mg Per Tube Daily  .  sodium chloride flush  3 mL Intravenous Q12H  . vancomycin  1,500 mg Intravenous Q48H    Background: 61yo female with hx CAD s/p CABG 2002, CKD V (not yet on HD), HTN, PVD, CHF (EF 40-45%) presented 1/18 with 1 week hx URI symptoms with worsening productive cough, SOB. Intubated for respiratory failure. Creatinine 5.85 w/BUN 80's. (I follow in the office, has had huge denial about dialysis, has been resistant to VVS evaluation for vascular access). Uremic and volume overloaded and agrees to HD as of 05/06/16. Temp cath to be placed by CCM for 1st HD today, TDC and permanent access when more stable, CLIP for outpt HD.    Assessment/Plan  1. Acute kidney injury superimposed on CKD V: New ESRD. (Patient of mine - Known progressive CKDV w/multiple prior episodes AKI on CKD with large amount denial about dialysis and as result has no permanent access). I feel we need to proceed with HD now for volume overload and azotemia.. CCM to place temp cath today - plan a couple of days HD for volume, see if can get off vent, then exchange for [facility] and have VVS see for permanent access when more stable. Patient is agreeable to proceed at this time.  Stop lasix. 2. Anemia - Has not required ESA as outpt. Start Aranesp with HD. Check Fe studies. 3. Secondary HPT - check PTH. Has been elevated as outpt.  4. Vol overload: Put out about 1 liter of urine w/lasix challenge, creatinine continues to rise. HD as above. 5. Severe sepsis, possible PNA: on vanc/ meropenem.  Resp panel neg. No blood cultures.  Sputum culture poor sample.  6. Acute hypoxic respiratory failure: intubated, per CCM.  Lung protective ventilation. CXR persistent diffuse ASDx/pulm edema  7. Acute systolic CHF exacerbation; EF 35-40%  05/04/16.   8. HTN: on amlodipine/clonidine. May need to reduce/hold doses to facilitate UF w/HD   Jamal Maes, MD [facility] [phone] pager 05/06/2016, 9:06 AM

## 2016-05-06 NOTE — Procedures (Signed)
I have personally attended this patient's dialysis session.   HD #1 R IJ temp cath (placed today by CCM) Bath changed to 2K UF so far 1.2 liters BP stable, HR 80's.  Jamal Maes, MD [facility] [phone] Pager 05/06/2016, 5:32 PM

## 2016-05-06 NOTE — Consult Note (Signed)
   [facility] CM Inpatient Consult   05/06/2016  Brooke Stafford 09-Apr-1956 [id number]    Ms. Worsley is active with [place] [facility] program. Chart reviewed. Noted Ms. Boggus is in ICU on vent. Will continue to follow. Will make inpatient RNCM aware that [place] [facility] is following.  [name], MSN-Ed, RN,BSN [place] [facility] Liaison [phone]

## 2016-05-06 NOTE — Progress Notes (Signed)
CSW consult acknowledged re: SNF placement. Patient intubated on 01/20. CSW now  following for SNF placement pending PT recommendations and medical appropriateness.    Lorrine Kin, MSW, LCSW [facility] ED/45M Clinical Social Worker [phone]

## 2016-05-07 ENCOUNTER — Inpatient Hospital Stay (HOSPITAL_COMMUNITY): Payer: Medicare Other

## 2016-05-07 LAB — GLUCOSE, CAPILLARY
GLUCOSE-CAPILLARY: 196 mg/dL — AB (ref 65–99)
GLUCOSE-CAPILLARY: 217 mg/dL — AB (ref 65–99)
Glucose-Capillary: 258 mg/dL — ABNORMAL HIGH (ref 65–99)
Glucose-Capillary: 220 mg/dL — ABNORMAL HIGH (ref 65–99)
Glucose-Capillary: 222 mg/dL — ABNORMAL HIGH (ref 65–99)
Glucose-Capillary: 209 mg/dL — ABNORMAL HIGH (ref 65–99)

## 2016-05-07 LAB — CBC
HCT: 23.4 % — ABNORMAL LOW (ref 36.0–46.0)
Hemoglobin: 7.6 g/dL — ABNORMAL LOW (ref 12.0–15.0)
MCH: 30.6 pg (ref 26.0–34.0)
MCHC: 32.5 g/dL (ref 30.0–36.0)
MCV: 94.4 fL (ref 78.0–100.0)
PLATELETS: 147 10*3/uL — AB (ref 150–400)
RBC: 2.48 MIL/uL — ABNORMAL LOW (ref 3.87–5.11)
RDW: 15.5 % (ref 11.5–15.5)
WBC: 7.3 10*3/uL (ref 4.0–10.5)

## 2016-05-07 LAB — RENAL FUNCTION PANEL
Albumin: 2.2 g/dL — ABNORMAL LOW (ref 3.5–5.0)
Anion gap: 11 (ref 5–15)
BUN: 83 mg/dL — ABNORMAL HIGH (ref 6–20)
CALCIUM: 8.5 mg/dL — AB (ref 8.9–10.3)
CO2: 23 mmol/L (ref 22–32)
CREATININE: 4.72 mg/dL — AB (ref 0.44–1.00)
Chloride: 106 mmol/L (ref 101–111)
GFR calc Af Amer: 11 mL/min — ABNORMAL LOW (ref >60)
GFR calc non Af Amer: 9 mL/min — ABNORMAL LOW (ref >60)
GLUCOSE: 300 mg/dL — AB (ref 65–99)
Phosphorus: 4.6 mg/dL (ref 2.5–4.6)
Potassium: 4.1 mmol/L (ref 3.5–5.1)
SODIUM: 140 mmol/L (ref 135–145)

## 2016-05-07 LAB — HEPATITIS B SURFACE ANTIGEN: Hepatitis B Surface Ag: NEGATIVE

## 2016-05-07 LAB — PROCALCITONIN: Procalcitonin: 8.99 ng/mL

## 2016-05-07 LAB — PARATHYROID HORMONE, INTACT (NO CA): PTH: 542 pg/mL — AB (ref 15–65)

## 2016-05-07 LAB — TRIGLYCERIDES: Triglycerides: 163 mg/dL — ABNORMAL HIGH (ref <150)

## 2016-05-07 LAB — ALT: ALT: 38 U/L (ref 14–54)

## 2016-05-07 LAB — HEPATITIS B CORE ANTIBODY, TOTAL: HEP B C TOTAL AB: NEGATIVE

## 2016-05-07 LAB — HEPATITIS B SURFACE ANTIBODY,QUALITATIVE: HEP B S AB: NONREACTIVE

## 2016-05-07 MED ORDER — DOXERCALCIFEROL 4 MCG/2ML IV SOLN
1.0000 ug | INTRAVENOUS | Status: DC
Start: 2016-05-08 — End: 2016-05-09
  Administered 2016-05-08: 1 ug via INTRAVENOUS
  Filled 2016-05-07 (×2): qty 2

## 2016-05-07 MED ORDER — METOPROLOL TARTRATE 5 MG/5ML IV SOLN
5.0000 mg | Freq: Four times a day (QID) | INTRAVENOUS | Status: DC
  Administered 2016-05-07 – 2016-05-09 (×6): 5 mg via INTRAVENOUS
  Filled 2016-05-07 (×9): qty 5

## 2016-05-07 MED ORDER — INSULIN GLARGINE 100 UNIT/ML ~~LOC~~ SOLN
25.0000 [IU] | Freq: Every day | SUBCUTANEOUS | Status: DC
  Administered 2016-05-07 – 2016-05-13 (×7): 25 [IU] via SUBCUTANEOUS
  Filled 2016-05-07 (×9): qty 0.25

## 2016-05-07 NOTE — Progress Notes (Signed)
Pt self-extubated herself.  Placed on 100% NRB Pt has strong, productive cough.  Sat 97%, RR 28.  Dr. Vaughan Browner present.  WIll continue to monitor pt.

## 2016-05-07 NOTE — Progress Notes (Addendum)
Inpatient Diabetes Program Recommendations  AACE/ADA: New Consensus Statement on Inpatient Glycemic Control (2015)  Target Ranges:  Prepandial:   less than 140 mg/dL      Peak postprandial:   less than 180 mg/dL (1-2 hours)      Critically ill patients:  140 - 180 mg/dL   Results for RHODIE, CIENFUEGOS (MRN [id number]) as of 05/07/2016 09:30  Ref. Range 05/06/2016 07:19 05/06/2016 11:56 05/06/2016 15:39 05/06/2016 20:48 05/06/2016 23:09 05/07/2016 03:55 05/07/2016 08:23  Glucose-Capillary Latest Ref Range: 65 - 99 mg/dL 297 (H) 330 (H) 251 (H) 291 (H) 287 (H) 258 (H) 220 (H)   Review of Glycemic Control  Diabetes history: DM 2 Outpatient Diabetes medications: Lantus 30 units, Novolog 10-20 units TID Current orders for Inpatient glycemic control: Lantus 45 units, Novolog Moderate Q4 hours, Novolog 7 units Q4 hours tube feed coverage  Inpatient Diabetes Program Recommendations:   Patient consistently in the 200-300 range x several days after increases in basal insulin and tube feed coverage. Due to patient's critical condition and SQ absorption, if current plan of care remains, please consider changing glycemic orders to ICU order set Phase 2, IV insulin.  RN can switch back to SQ insulin when parameters are met.  Thanks,  Tama Headings RN, MSN, [facility] Inpatient Diabetes Coordinator Team Pager [phone] (8a-5p)

## 2016-05-07 NOTE — Progress Notes (Signed)
[facility] Rounding Note Subjective/Interval History:   Remains intubated  Had dialysis yesterday and again today Post weight 103.8!! (from 111.5 prior to 1st HD) Indicates less naueated  Objective Vital signs in last 24 hours: Vitals:   05/07/16 0915 05/07/16 0930 05/07/16 0945 05/07/16 1000  BP: 135/82 118/79 137/90 126/87  Pulse: 86 90 84 83  Resp: (!) 24 (!) 25 (!) 24 (!) 24  Temp:      TempSrc:      SpO2:  95% 100% 100%  Weight:      Height:        Intake/Output Summary (Last 24 hours) at 05/07/16 1110 Last data filed at 05/07/16 1000  Gross per 24 hour  Intake          1521.83 ml  Output             3690 ml  Net         -2168.17 ml   Physical Exam:  Blood pressure 126/87, pulse 83, temperature 97.5 F (36.4 C), temperature source Oral, resp. rate (!) 24, height '5\' 10"'  (1.778 m), weight 107.3 kg (236 lb 8.9 oz), last menstrual period 10/13/2014, SpO2 100 %.  Physical Exam  Intubated but awake and alert, wants tube out (device is hurting her mouth) Hands still mittened. Hard to see neck veins Chest currently anteriorly clear S1S2 No S3 and I can't hear murmur Abd soft, hypoactive bowel sounds, nondistended, nontender EXT still with 2+ pitting edema Neuro - Awake, slow to respond (usual), follows commands appropriately   Recent Labs Lab 05/04/16 0044 05/04/16 0309 05/04/16 1015 05/04/16 1658 05/05/16 0454 05/05/16 1627 05/06/16 0023 05/06/16 0221 05/07/16 0450  NA 139 138 138  --  139  --  137 139 140  K 5.5* 5.8* 4.8  --  5.1  --  4.6 4.4 4.1  CL 106 105 105  --  107  --  107 106 106  CO2 21* 22 23  --  23  --  19* 18* 23  GLUCOSE 312* 273* 310*  --  247*  --  364* 352* 300*  BUN 55* 56* 58*  --  76*  --  84* 83* 83*  CREATININE 4.53* 4.60* 4.73*  --  5.55*  --  5.67* 5.85* 4.72*  CALCIUM 9.8 9.6 9.6  --  8.8*  --  8.6* 8.8* 8.5*  PHOS  --   --  5.3* 3.4 2.0* 2.2* 3.3  --  4.6    Recent Labs Lab 05/02/16 2145  05/04/16 0309 05/05/16 0454 05/06/16 0221  05/07/16 0450  WBC 7.9  < > 14.9* 8.6 7.1 7.3  NEUTROABS 4.5  --   --   --   --   --   HGB 11.9*  < > 9.0* 7.8* 7.5* 7.6*  HCT 35.8*  < > 27.7* 24.0* 23.5* 23.4*  MCV 93.2  < > 93.9 94.9 95.1 94.4  PLT 184  < > 152 131* 121* 147*  < > = values in this interval not displayed.   Recent Labs Lab 05/03/16 1900  CKTOTAL 552*  CKMB 11.6*  TROPONINI 0.60*    Recent Labs Lab 05/06/16 1539 05/06/16 2048 05/06/16 2309 05/07/16 0355 05/07/16 0823  GLUCAP 251* 291* 287* 258* 220*   Iron/TIBC/Ferritin/ %Sat    Component Value Date/Time   IRON 25 (L) 05/06/2016 1000   TIBC 202 (L) 05/06/2016 1000   FERRITIN 95 04/18/2016 1318   IRONPCTSAT 12 05/06/2016 1000   Results for [name]  Carlean Jews (MRN [id number]) as of 05/07/2016 11:43  Ref. Range 05/06/2016 10:00  PTH Latest Ref Range: 15 - 65 pg/mL 542 (H)   Studies/Results: Dg Chest Port 1 View  Result Date: 05/07/2016 CLINICAL DATA:  Pulmonary edema EXAM: PORTABLE CHEST 1 VIEW COMPARISON:  05/06/2016 FINDINGS: Endotracheal tube, NG tube, right jugular venous catheter are stable. The heart is moderately enlarged. Opacity at the left base has improved. Vascular congestion throughout the remainder of the lungs has improved. No pneumothorax. IMPRESSION: Improved opacity at the left base. Improved vascular congestion. Electronically Signed   By: Marybelle Killings M.D.   On: 05/07/2016 07:09   Dg Chest Port 1 View  Result Date: 05/06/2016 CLINICAL DATA:  Status post central line placement. EXAM: PORTABLE CHEST 1 VIEW COMPARISON:  Portable chest x-ray of today's date at 5:13 a.m. FINDINGS: The patient has undergone placement of a right internal jugular Cordis sheath. There is no postprocedure pneumothorax or hemothorax. The tip of the catheter overlies the proximal SVC. The lungs are reasonably well inflated. There is persistent left lower lobe density with partial obscuration of the hemidiaphragm. The interstitial markings remain increased in both  lungs with area of confluence. The cardiac silhouette remains enlarged. The endotracheal tube tip lies approximately 1.7 cm above the carina. The esophagogastric tube tip projects below the inferior margin of the study. IMPRESSION: No postprocedure complication following placement of the right internal jugular Cordis sheath. The tip projects over the proximal SVC. Electronically Signed   By: David  Martinique M.D.   On: 05/06/2016 14:54   Dg Chest Port 1 View  Result Date: 05/06/2016 CLINICAL DATA:  Pneumonia. EXAM: PORTABLE CHEST 1 VIEW COMPARISON:  05/05/2016. FINDINGS: Endotracheal tube and NG tube in stable position. Prior CABG. Diffuse bilateral airspace disease again noted. No significant change. Low lung volumes. Small left pleural effusion cannot be excluded. No pneumothorax. IMPRESSION: 1. Lines and tubes in stable position. 2. Prior CABG with cardiomegaly. Diffuse bilateral airspace disease/pulmonary edema again noted. No significant change. Small left effusion cannot be excluded . Electronically Signed   By: Marcello Moores  Register   On: 05/06/2016 07:07   Medications: . sodium chloride 10 mL/hr (05/07/16 1000)  . feeding supplement (VITAL HIGH PROTEIN) 1,000 mL (05/07/16 0326)  . propofol (DIPRIVAN) infusion 15 mcg/kg/min (05/07/16 0911)   . amLODipine  10 mg Oral Daily  . aspirin  81 mg Oral Daily  . atorvastatin  40 mg Oral Daily  . chlorhexidine gluconate (MEDLINE KIT)  15 mL Mouth Rinse BID  . cloNIDine  0.2 mg Oral TID  . clopidogrel  75 mg Oral QHS  . darbepoetin (ARANESP) injection - DIALYSIS  200 mcg Intravenous Q Mon-HD  . ezetimibe  10 mg Oral Daily  . feeding supplement (PRO-STAT SUGAR FREE 64)  30 mL Per Tube BID  . heparin subcutaneous  5,000 Units Subcutaneous Q8H  . insulin aspart  0-15 Units Subcutaneous Q4H  . insulin aspart  7 Units Subcutaneous Q4H  . insulin glargine  45 Units Subcutaneous QHS  . ipratropium-albuterol  3 mL Nebulization QID  . levothyroxine  25 mcg Oral  QAC breakfast  . mouth rinse  15 mL Mouth Rinse QID  . meropenem (MERREM) IV  500 mg Intravenous QHS  . metoprolol tartrate  100 mg Per Tube BID  . pantoprazole sodium  40 mg Per Tube Daily  . sodium chloride flush  3 mL Intravenous Q12H    Background: 61yo female with hx CAD s/p CABG 2002, CKD V (not  yet on HD), HTN, PVD, CHF (EF 40-45%) presented 1/18 with 1 week hx URI symptoms with worsening productive cough, SOB. Intubated for respiratory failure. Creatinine 5.85 w/BUN 80's. (I follow in the office, has had huge denial about dialysis, has been resistant to VVS evaluation for vascular access). Uremic and volume overloaded and agrees to HD as of 05/06/16. Temp cath placed by CCM 1/22 and HD intitated, TDC and permanent access when more stable, CLIP for outpt HD.   Assessment/Plan  1. AKI on advanced CKD 5. New ESRD. (Patient of mine - Known progressive CKD V w/multiple prior episodes AKI on CKD and large amount denial about dialysis-> as result no permanent access). S/p HD X 2 treatments with reduction in volume by about 6 kg by weights -> net UF 2 liters with 1st TMT, 2.5 liters today. Still has extra volume on board. Plan for another treatment tomorrow. Once extubated will proceed with outpt planning (have already started CLIP process), will order vein mapping. Will need to consult VVS for [facility] and AVF/AVG.   2. Anemia - Has not required ESA as outpt. Started Aranesp with HD. (200 QMonday) TSat low. Once off ATBs, will load with iron on HD.  3. Secondary HPT - PTH 542 outpt. Start 1 mcg hectorol with HD. Phos OK - no binders yet.   4. Vol overload: 5.5 liters of fluid off with 2 HD treatments. Still with pitting LE edema. (HD again tomorrow)  5. Severe sepsis, possible PNA: on vanc/ meropenem.  Resp panel neg. No blood cultures.  Sputum culture poor sample.   6. Acute hypoxic respiratory failure: intubated, per CCM.  Lung protective ventilation. CXR persistent diffuse ASDx/pulm edema -  somewhat improved today. Per CCM   7. Acute sCHF exacerbation; EF 35-40% 05/04/16.  Volume reduction with HD will help this.  8. HTN: on amlodipine/clonidine. May need to reduce/hold doses to facilitate UF w/HD   Jamal Maes, MD [facility] [phone] pager 05/07/2016, 11:10 AM

## 2016-05-07 NOTE — Consult Note (Signed)
PULMONARY / CRITICAL CARE MEDICINE   Name: Brooke Stafford MRN: [id number] DOB: April 30, 1955    ADMISSION DATE:  05/02/2016 CONSULTATION DATE:  1/20  REFERRING MD:  Wendee Beavers ([facility])   CHIEF COMPLAINT:  Respiratory failure   HISTORY OF PRESENT ILLNESS:   61yo female with hx CAD s/p CABG 2002, CKD V (not yet on HD), HTN, CHF (EF 40-45%) presented 1/18 with 1 week hx URI symptoms with worsening productive cough, SOB.  In ER was hypoxic with sats 60's, febrile, placed on bipap.  She was admitted by [facility] with PNA v pulmonary edema. She was started on IV abx and aggressive diuresis without significant improvement.  She continued to require bipap and on the am of 1/20 had significantly increased WOB, minimal UOP despite multiple doses lasix and PCCM consulted.     SUBJECTIVE:  HD R IJ temp cath placed yesterday and patient attended hemodialysis yesterday afternoon.  Creatinine has improved.  VITAL SIGNS: BP 134/86   Pulse 69   Temp 98.9 F (37.2 C) (Oral)   Resp (!) 24   Ht 5\' 10"  (1.778 m)   Wt 107.5 kg (236 lb 15.9 oz)   LMP 10/13/2014 (Exact Date)   SpO2 99%   BMI 34.01 kg/m   HEMODYNAMICS:    VENTILATOR SETTINGS: Vent Mode: PRVC FiO2 (%):  [40 %] 40 % Set Rate:  [24 bmp] 24 bmp Vt Set:  [550 mL] 550 mL PEEP:  [5 cmH20] 5 cmH20 Pressure Support:  [10 cmH20] 10 cmH20 Plateau Pressure:  [22 cmH20-25 cmH20] 22 cmH20  INTAKE / OUTPUT: I/O last 3 completed shifts: In: 3089.7 [I.V.:414.4; TIRWE:315; QM/GQ:6761; IV Piggyback:585.3] Out: 2775 [Urine:2475; Emesis/NG output:300]  PHYSICAL EXAMINATION:  General:  Chronically ill appearing female  Neuro:  Responsive to verbal commands, nods head to questions HEENT:  [place]/AT, no JVD  Cardiovascular:  RRR, no m/g/r, 1+ pitting edema BLE Lungs: coarse breath sounds on anterior chest wall  Abdomen:  Round, soft, non tender  Musculoskeletal:  able to move all four extremities Skin: Warm and dry     LABS:  BMET  Recent Labs Lab  05/06/16 0023 05/06/16 0221 05/07/16 0450  NA 137 139 140  K 4.6 4.4 4.1  CL 107 106 106  CO2 19* 18* 23  BUN 84* 83* 83*  CREATININE 5.67* 5.85* 4.72*  GLUCOSE 364* 352* 300*    Electrolytes  Recent Labs Lab 05/05/16 0454 05/05/16 1627 05/06/16 0023 05/06/16 0221 05/07/16 0450  CALCIUM 8.8*  --  8.6* 8.8* 8.5*  MG 2.2 2.2 2.2  --   --   PHOS 2.0* 2.2* 3.3  --  4.6    CBC  Recent Labs Lab 05/05/16 0454 05/06/16 0221 05/07/16 0450  WBC 8.6 7.1 7.3  HGB 7.8* 7.5* 7.6*  HCT 24.0* 23.5* 23.4*  PLT 131* 121* 147*    Coag's No results for input(s): APTT, INR in the last 168 hours.  Sepsis Markers  Recent Labs Lab 05/03/16 0328 05/05/16 0454 05/07/16 0450  PROCALCITON 0.69 14.61 8.99    ABG  Recent Labs Lab 05/04/16 1201  PHART 7.164*  PCO2ART 62.7*  PO2ART 68.0*    Liver Enzymes  Recent Labs Lab 05/07/16 0450  ALBUMIN 2.2*    Cardiac Enzymes  Recent Labs Lab 05/03/16 1900  TROPONINI 0.60*    Glucose  Recent Labs Lab 05/06/16 0719 05/06/16 1156 05/06/16 1539 05/06/16 2048 05/06/16 2309 05/07/16 0355  GLUCAP 297* 330* 251* 291* 287* 258*    Imaging Dg Chest  Port 1 View  Result Date: 05/06/2016 CLINICAL DATA:  Status post central line placement. EXAM: PORTABLE CHEST 1 VIEW COMPARISON:  Portable chest x-ray of today's date at 5:13 a.m. FINDINGS: The patient has undergone placement of a right internal jugular Cordis sheath. There is no postprocedure pneumothorax or hemothorax. The tip of the catheter overlies the proximal SVC. The lungs are reasonably well inflated. There is persistent left lower lobe density with partial obscuration of the hemidiaphragm. The interstitial markings remain increased in both lungs with area of confluence. The cardiac silhouette remains enlarged. The endotracheal tube tip lies approximately 1.7 cm above the carina. The esophagogastric tube tip projects below the inferior margin of the study. IMPRESSION:  No postprocedure complication following placement of the right internal jugular Cordis sheath. The tip projects over the proximal SVC. Electronically Signed   By: David  Martinique M.D.   On: 05/06/2016 14:54     STUDIES:  2D echo 1/20>>> LV EF: 35% -   76%, grade 2 diastolic dysfunction  CULTURES: RVP 1/19>>> NEG RVP (from tracheal aspirate) 1/20>>>no growth Sputum 1/20>>> no growth  ANTIBIOTICS: azithro 1/20>>>1/20 Vanc 1/20>> 1/22 Imipenem 1/20>>>  SIGNIFICANT EVENTS: Intubated on 1/20.   Right IJ placed on 1/22 and started on hemodialysis.  LINES/TUBES: ETT 1/20>>> HD right IJ > 1/22  DISCUSSION: 61yo female with acute respiratory failure in setting acute on chronic CHF, CKD, PNA. Failed bipap, intubated 1/20.   ASSESSMENT / PLAN:  PULMONARY Acute hypoxic respiratory failure - multifactorial in setting probable PNA and acute on chronic CHF with underling COPD.  Did not respond to diuresis.  Worsening CXR. Failed bipap.  P:    Vent support  Meropenem Stopped vancomycin Sputum culture >> no growth BD's  CARDIOVASCULAR Acute on chronic combined CHF  Hx CAD - s/p CABG 2002, PCI 2016 and 2016 HTN P:    Hemodialysis, temp R IJ Asa, statin, plavix  Clonidine, amlodipine, metoprolol  Ezetimibe, lipitor   RENAL Acute on CKD V - baseline Scr ~4, improving with HD Hyperkalemia - resolved P:   Replace lytes as needed Hemodialysis, temp R IJ Appreciate nephrology's recommendations  GASTROINTESTINAL No active issue  P:   NPO   PPI   HEMATOLOGIC Thrombocytopenia - in setting of acute illness, improving at 147  Normocytic anemia - hgb 7.6 - stable P:  lovenox  CBC in morning  Transfuse <7.0  INFECTIOUS ?PNA  Elevated PCT - 14.61 P:   Meropenem  Trend pct  Sputum culture >> No growth  Respiratory panel > non detected  ENDOCRINE DM - CBGs continues to be elevated  hypothyroid  P:   Continue to increase SSI, lantus  Continue synthroid    NEUROLOGIC Sedation needs on vent  P:   RASS goal: -1 Propofol    FAMILY  - Updates:  Sister and boyfriend updated at bedside 1/22  - Inter-disciplinary family meet or Palliative Care meeting due by: 1/27

## 2016-05-07 NOTE — Hospital Discharge Follow-Up (Signed)
The patient has been followed at the [facility] Will schedule follow up appointment as needed.   Will continue to follow hospital course.

## 2016-05-07 NOTE — Care Management Note (Addendum)
Case Management Note  Patient Details  Name: Brooke Stafford MRN: [id number] Date of Birth: 09/15/1955  Subjective/Objective:   Pt admitted with acute resp failure and Acute/Chronic Kidney Injury                 Action/Plan:  PTA from home, active with both [facility] and [facility] (RN,PT and SW).  Pt is now intubated and started on IHD.  Pt will have PT eval post extubation.  CSW has been consulted for housing issues.   Expected Discharge Date:                  Expected Discharge Plan:  [place]  In-House Referral:  Clinical Social Work  Discharge planning Services     Post Acute Care Choice:  Resumption of Svcs/PTA Provider Choice offered to:     DME Arranged:    DME Agency:     HH Arranged:  RN, PT, Social Work [employer] Agency:  Other - See comment  Status of Service:  In process, will continue to follow  If discussed at Long Length of Stay Meetings, dates discussed:    Additional Comments: 05/07/2016 Discussed in LOS 05/07/2016 - pt remains appropriate for continued stay.  Discharge disposition is not determined at this time.  Physician Advisor requested pt be placed in [facility] program with [employer].  Agency aware and accepted referral.  CM will communicate the referral of the program to the pt and [facility] once pt is  extubated, alert and oriented. Maryclare Labrador, RN 05/07/2016, 9:02 AM

## 2016-05-07 NOTE — Procedures (Signed)
HD #2 today Hypotension into 70's with decreased LOC  about 18 minutes before end of TMT so rinsed back Otherwise well tolerated BP back up now, pt alert. 2.5 liters off Temp cath R IJ  Jamal Maes, MD [facility] [phone] Pager 05/07/2016, 11:08 AM

## 2016-05-08 ENCOUNTER — Inpatient Hospital Stay: Payer: Medicare Other | Admitting: Family Medicine

## 2016-05-08 DIAGNOSIS — I2510 Atherosclerotic heart disease of native coronary artery without angina pectoris: Secondary | ICD-10-CM

## 2016-05-08 DIAGNOSIS — N185 Chronic kidney disease, stage 5: Secondary | ICD-10-CM

## 2016-05-08 LAB — GLUCOSE, CAPILLARY
GLUCOSE-CAPILLARY: 134 mg/dL — AB (ref 65–99)
GLUCOSE-CAPILLARY: 153 mg/dL — AB (ref 65–99)
GLUCOSE-CAPILLARY: 171 mg/dL — AB (ref 65–99)
Glucose-Capillary: 181 mg/dL — ABNORMAL HIGH (ref 65–99)
Glucose-Capillary: 165 mg/dL — ABNORMAL HIGH (ref 65–99)
Glucose-Capillary: 127 mg/dL — ABNORMAL HIGH (ref 65–99)

## 2016-05-08 LAB — RENAL FUNCTION PANEL
ALBUMIN: 2.4 g/dL — AB (ref 3.5–5.0)
ANION GAP: 10 (ref 5–15)
BUN: 62 mg/dL — ABNORMAL HIGH (ref 6–20)
CO2: 26 mmol/L (ref 22–32)
Calcium: 9 mg/dL (ref 8.9–10.3)
Chloride: 104 mmol/L (ref 101–111)
Creatinine, Ser: 3.78 mg/dL — ABNORMAL HIGH (ref 0.44–1.00)
GFR calc non Af Amer: 12 mL/min — ABNORMAL LOW (ref >60)
GFR, EST AFRICAN AMERICAN: 14 mL/min — AB (ref >60)
GLUCOSE: 179 mg/dL — AB (ref 65–99)
PHOSPHORUS: 6.2 mg/dL — AB (ref 2.5–4.6)
POTASSIUM: 4.1 mmol/L (ref 3.5–5.1)
Sodium: 140 mmol/L (ref 135–145)

## 2016-05-08 LAB — CBC
HEMATOCRIT: 27.3 % — AB (ref 36.0–46.0)
HEMOGLOBIN: 8.6 g/dL — AB (ref 12.0–15.0)
MCH: 30.4 pg (ref 26.0–34.0)
MCHC: 31.5 g/dL (ref 30.0–36.0)
MCV: 96.5 fL (ref 78.0–100.0)
Platelets: 218 10*3/uL (ref 150–400)
RBC: 2.83 MIL/uL — AB (ref 3.87–5.11)
RDW: 15 % (ref 11.5–15.5)
WBC: 10.2 10*3/uL (ref 4.0–10.5)

## 2016-05-08 LAB — HEPATITIS B SURFACE ANTIGEN: HEP B S AG: NEGATIVE

## 2016-05-08 LAB — HEPATITIS B CORE ANTIBODY, TOTAL: Hep B Core Total Ab: NEGATIVE

## 2016-05-08 LAB — HEPATITIS B SURFACE ANTIBODY, QUANTITATIVE: Hepatitis B-Post: <3.1 m[IU]/mL — ABNORMAL LOW (ref >9.9)

## 2016-05-08 MED ORDER — AMLODIPINE BESYLATE 10 MG PO TABS
10.0000 mg | ORAL_TABLET | Freq: Every day | ORAL | Status: DC
  Administered 2016-05-09 – 2016-05-13 (×5): 10 mg via ORAL
  Filled 2016-05-08 (×6): qty 1

## 2016-05-08 MED ORDER — LOPERAMIDE HCL 2 MG PO CAPS
2.0000 mg | ORAL_CAPSULE | ORAL | Status: DC | PRN
  Administered 2016-05-08: 2 mg via ORAL
  Filled 2016-05-08 (×2): qty 1

## 2016-05-08 MED ORDER — CLONIDINE HCL 0.1 MG PO TABS
0.1000 mg | ORAL_TABLET | Freq: Three times a day (TID) | ORAL | Status: DC
  Administered 2016-05-08 – 2016-05-13 (×11): 0.1 mg via ORAL
  Filled 2016-05-08 (×14): qty 1

## 2016-05-08 MED ORDER — SODIUM CHLORIDE 0.9 % IV SOLN
125.0000 mg | INTRAVENOUS | Status: DC
  Administered 2016-05-08: 125 mg via INTRAVENOUS
  Filled 2016-05-08 (×3): qty 10

## 2016-05-08 NOTE — Progress Notes (Signed)
[facility] Rounding Note Subjective/Interval History:   Total of 2 HD treatments and 3rd for today Weight down 111.5->103.7 Self extubated yesterday Just had swallow eval (she says she passed?) Says feels "great"  Objective Vital signs in last 24 hours: Vitals:   05/08/16 0600 05/08/16 0700 05/08/16 0725 05/08/16 0733  BP: (!) 120/99 (!) 129/98    Pulse: 84 75    Resp: 14 17    Temp:   99 F (37.2 C)   TempSrc:   Oral   SpO2: 99% 98%  99%  Weight:      Height:        Intake/Output Summary (Last 24 hours) at 05/08/16 1033 Last data filed at 05/08/16 0900  Gross per 24 hour  Intake            285.3 ml  Output             1275 ml  Net           -989.7 ml   Physical Exam:  Blood pressure (!) 129/98, pulse 75, temperature 99 F (37.2 C), temperature source Oral, resp. rate 17, height _0  (1.778 m), weight 103.7 kg (228 lb 9.9 oz), last menstrual period 10/13/2014, SpO2 99 %.  Physical Exam  Extubated, on nasal cannula R IJ trialysis cath w/dsg Hard to see neck veins Chest currently anteriorly clear, but a few crackles yet at R base S1S2 No S3 and I can't hear murmur Abd soft, hypoactive bowel sounds, nondistended, nontender Edema now just trace-1+ LE's Neuro - Awake, alert, baseline MS   Recent Labs Lab 05/04/16 0309 05/04/16 1015 05/04/16 1658 05/05/16 0454 05/05/16 1627 05/06/16 0023 05/06/16 0221 05/07/16 0450 05/08/16 0350  NA 138 138  --  139  --  137 139 140 140  K 5.8* 4.8  --  5.1  --  4.6 4.4 4.1 4.1  CL 105 105  --  107  --  107 106 106 104  CO2 22 23  --  23  --  19* 18* 23 26  GLUCOSE 273* 310*  --  247*  --  364* 352* 300* 179*  BUN 56* 58*  --  76*  --  84* 83* 83* 62*  CREATININE 4.60* 4.73*  --  5.55*  --  5.67* 5.85* 4.72* 3.78*  CALCIUM 9.6 9.6  --  8.8*  --  8.6* 8.8* 8.5* 9.0  PHOS  --  5.3* 3.4 2.0* 2.2* 3.3  --  4.6 6.2*    Recent Labs Lab 05/02/16 2145  05/05/16 0454 05/06/16 0221 05/07/16 0450 05/08/16 0347  WBC 7.9  < > 8.6 7.1  7.3 10.2  NEUTROABS 4.5  --   --   --   --   --   HGB 11.9*  < > 7.8* 7.5* 7.6* 8.6*  HCT 35.8*  < > 24.0* 23.5* 23.4* 27.3*  MCV 93.2  < > 94.9 95.1 94.4 96.5  PLT 184  < > 131* 121* 147* 218  < > = values in this interval not displayed.   Recent Labs Lab 05/03/16 1900  CKTOTAL 552*  CKMB 11.6*  TROPONINI 0.60*    Recent Labs Lab 05/07/16 1607 05/07/16 1943 05/07/16 2354 05/08/16 0438 05/08/16 0722  GLUCAP 209* 217* 196* 171* 153*   Iron/TIBC/Ferritin/ %Sat    Component Value Date/Time   IRON 25 (L) 05/06/2016 1000   TIBC 202 (L) 05/06/2016 1000   FERRITIN 95 04/18/2016 1318   IRONPCTSAT 12 05/06/2016 1000  Results for Brooke Stafford, Brooke Stafford (MRN [id number]) as of 05/07/2016 11:43  Ref. Range 05/06/2016 10:00  PTH Latest Ref Range: 15 - 65 pg/mL 542 (H)   Studies/Results: Dg Chest Port 1 View  Result Date: 05/07/2016 CLINICAL DATA:  Pulmonary edema EXAM: PORTABLE CHEST 1 VIEW COMPARISON:  05/06/2016 FINDINGS: Endotracheal tube, NG tube, right jugular venous catheter are stable. The heart is moderately enlarged. Opacity at the left base has improved. Vascular congestion throughout the remainder of the lungs has improved. No pneumothorax. IMPRESSION: Improved opacity at the left base. Improved vascular congestion. Electronically Signed   By: Marybelle Killings M.D.   On: 05/07/2016 07:09   Dg Chest Port 1 View  Result Date: 05/06/2016 CLINICAL DATA:  Status post central line placement. EXAM: PORTABLE CHEST 1 VIEW COMPARISON:  Portable chest x-ray of today's date at 5:13 a.m. FINDINGS: The patient has undergone placement of a right internal jugular Cordis sheath. There is no postprocedure pneumothorax or hemothorax. The tip of the catheter overlies the proximal SVC. The lungs are reasonably well inflated. There is persistent left lower lobe density with partial obscuration of the hemidiaphragm. The interstitial markings remain increased in both lungs with area of confluence. The cardiac  silhouette remains enlarged. The endotracheal tube tip lies approximately 1.7 cm above the carina. The esophagogastric tube tip projects below the inferior margin of the study. IMPRESSION: No postprocedure complication following placement of the right internal jugular Cordis sheath. The tip projects over the proximal SVC. Electronically Signed   By: David  Martinique M.D.   On: 05/06/2016 14:54   Medications: . sodium chloride 10 mL/hr (05/07/16 1000)  . feeding supplement (VITAL HIGH PROTEIN) Stopped (05/07/16 1200)   . amLODipine  10 mg Oral Daily  . aspirin  81 mg Oral Daily  . atorvastatin  40 mg Oral Daily  . chlorhexidine gluconate (MEDLINE KIT)  15 mL Mouth Rinse BID  . cloNIDine  0.2 mg Oral TID  . clopidogrel  75 mg Oral QHS  . darbepoetin (ARANESP) injection - DIALYSIS  200 mcg Intravenous Q Mon-HD  . doxercalciferol  1 mcg Intravenous Q M,W,F-HD  . ezetimibe  10 mg Oral Daily  . feeding supplement (PRO-STAT SUGAR FREE 64)  30 mL Per Tube BID  . heparin subcutaneous  5,000 Units Subcutaneous Q8H  . insulin aspart  0-15 Units Subcutaneous Q4H  . insulin aspart  7 Units Subcutaneous Q4H  . insulin glargine  25 Units Subcutaneous QHS  . ipratropium-albuterol  3 mL Nebulization QID  . levothyroxine  25 mcg Oral QAC breakfast  . mouth rinse  15 mL Mouth Rinse QID  . metoprolol  5 mg Intravenous Q6H  . pantoprazole sodium  40 mg Per Tube Daily  . sodium chloride flush  3 mL Intravenous Q12H    Background: 61yo female with hx CAD s/p CABG 2002, CKD V (not yet on HD), HTN, PVD, CHF (EF 40-45%) presented 1/18 with 1 week hx URI symptoms with worsening productive cough, SOB. Intubated for respiratory failure. Creatinine 5.85 w/BUN 80's. (I follow in the office, has had huge denial about dialysis, was resistant to VVS evaluation for vascular access as outpt). Uremic and volume overloaded and agreed to HD as of 05/06/16. Temp cath placed by CCM 1/22 and HD intitated 1/22.  TDC and permanent  access needed, CLIP for outpt HD.   Assessment/Plan  1. AKI on advanced CKD 5. New ESRD. (Patient of mine - Known progressive CKD V w/multiple prior episodes AKI on CKD  and large amount denial about dialysis-> as result no permanent access). S/p HD X 2 with about 8 kg reduction in volume. Scheduled for HD #3 today. CLIP process started, VVS has been contacted about AFV/AVG and TDC. Vein mapping ordered.     2. Anemia - Had not required ESA as outpt. Hb currently 8.6. Started Aranesp with HD. (200 QMonday) TSat low. Now that off ATB's will load with Fe.   3. Secondary HPT - PTH 542 outpt. Started 1 mcg hectorol with HD. Phos OK - no binders yet.   4. Vol overload: 8 liters of fluid off with 3 HD treatments. Still with mild pitting LE edema. (HD again today)  5. Possible PNA:  ATB's stopped  6. Acute hypoxic respiratory failure: Extubated, doing well off the vent.   7. Acute sCHF exacerbation; EF 35-40% 05/04/16.  Volume reduction with HD has helped this.  8. HTN: on amlodipine/clonidine. Will need to reduce/hold doses to facilitate UF w/HD. Reduced clonidine to 0.1 mg, changed amlodipine to HS dosing   Jamal Maes, MD [facility] [phone] pager 05/08/2016, 10:33 AM
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
## 2016-05-08 NOTE — Evaluation (Addendum)
Clinical/Bedside Swallow Evaluation/treatment Patient Details  Name: Brooke Stafford MRN: [id number] Date of Birth: [birth year]  Today's Date: 05/08/2016 Time:    10:00 - 10:20; f/u treatment13:45- 1400    Past Medical History:  Past Medical History:  Diagnosis Date  . Anemia   . Anginal pain ([place])   . Anxiety   . Arthritis    "stiff fingers and knees" (08/04/2013), (12/12/2014)  . Asthma   . CAD (coronary artery disease) 2002; 2015   CABG x 6 2002, cath 2011- med Rx stent DES VG-Diag  . CAD (coronary artery disease) of artery bypass graft; DES to VG-Diag 09/28/13 11/09/2013  . Cataract   . CHF (congestive heart failure) ([place])    "in 2002" (11/26/2012)  . Chronic bronchitis ([place])    "q year; in the winter"   . CKD (chronic kidney disease)    stage 4, followed by [facility]  . Coronary artery disease 2002   CABG x 6. Cath 5/11- med Rx  . GERD (gastroesophageal reflux disease)   . Gout    "right big toe"  . Headache    "~ q week" (08/04/2013); "~ twice/month" (12/12/2014)  . History of blood transfusion 2002   "when I had OHS"  . Hyperlipidemia   . Hypertension   . Hypothyroid    treated  . Migraines    "couple times/year" (08/04/2013), (12/12/2014)  . Myocardial infarction 2000; 2002; 2011  . Obesity (BMI 35.0-39.9 without comorbidity)   . Peripheral vascular disease ([place]) 12/12   LSFA PTA  . Pneumonia    "3 times I think" (12/12/2014)  . Type II diabetes mellitus ([place])    Past Surgical History:  Past Surgical History:  Procedure Laterality Date  . ABDOMINAL AORTAGRAM N/A 04/05/2011   Procedure: ABDOMINAL AORTAGRAM;  Surgeon: Lorretta Harp, MD;  Location: [facility] CATH LAB;  Service: Cardiovascular;  Laterality: N/A;  . APPENDECTOMY  1980  . BREAST CYST EXCISION Right 1970's  . CARDIAC CATHETERIZATION  2002  . CARDIAC CATHETERIZATION N/A 12/12/2014   Procedure: Left Heart Cath and Cors/Grafts Angiography;  Surgeon: Peter M Martinique, MD;  Location: [place] CV LAB;   Service: Cardiovascular;  Laterality: N/A;  . CARDIAC CATHETERIZATION N/A 12/12/2014   Procedure: Coronary Stent Intervention;  Surgeon: Peter M Martinique, MD;  Location: [place] CV LAB;  Service: Cardiovascular;  Laterality: N/A;  . [place]; 1980  . CHOLECYSTECTOMY  1982  . CORONARY ANGIOPLASTY WITH STENT PLACEMENT  2004; 2012   "I have 2 stents" (08/04/2013)  . CORONARY ANGIOPLASTY WITH STENT PLACEMENT  09/28/13   PTCA/ DES Xience stent to VG-Diag   . CORONARY ARTERY BYPASS GRAFT  11/20/2000   x6 LIMA to distal LAD, svg to first diag, svg to ramus intermediate branch and swquential SVG to cir marginal branch, SVG to posterior descending coronary and sequential SVG to first right posterolateral branch  . LEFT HEART CATHETERIZATION WITH CORONARY/GRAFT ANGIOGRAM N/A 09/28/2013   Procedure: LEFT HEART CATHETERIZATION WITH Beatrix Fetters;  Surgeon: Peter M Martinique, MD;  Location: [facility] CATH LAB;  Service: Cardiovascular;  Laterality: N/A;  . LOWER EXTREMITY ANGIOGRAM  12/01/2012   Procedure: LOWER EXTREMITY ANGIOGRAM;  Surgeon: Lorretta Harp, MD;  Location: [facility] CATH LAB;  Service: Cardiovascular;;  . NM MYOCAR PERF WALL MOTION  08/27/2004   negative  . PERCUTANEOUS STENT INTERVENTION Left 12/01/2012   Procedure: PERCUTANEOUS STENT INTERVENTION;  Surgeon: Lorretta Harp, MD;  Location: [facility] CATH LAB;  Service: Cardiovascular;  Laterality:  Left;  Left SFA  . PERIPHERAL ARTERIAL STENT GRAFT Left    SFA/notes 04/07/2011 (11/30/2012)  . RENAL ANGIOGRAM N/A 04/05/2011   Procedure: RENAL ANGIOGRAM;  Surgeon: Lorretta Harp, MD;  Location: [facility] CATH LAB;  Service: Cardiovascular;  Laterality: N/A;  . TUBAL LIGATION  1980   HPI:  61yo female with hx CAD s/p CABG 2002, CKD V (not yet on HD), HTN, CHF (EF 40-45%) presented 1/18 with 1 week hx URI symptoms with worsening productive cough, SOB. In ER was hypoxic with sats 60's, febrile, placed on bipap. She was admitted by [facility] with PNA v  pulmonary edema. She was started on IV abx and aggressive diuresis without significant improvement.  ETT 1/20-1/23 (self-extubated).   Assessment / Plan / Recommendation Clinical Impression  Initial assessment 10 am: Pt presents with concerns for impaired airway protection with thin liquids with consumption eliciting cough.  Purees consumed without clinical s/s of difficulty.  Pt alert, oriented, with no focal deficits and RR well below threshold for concerns re: swallowing/respiratory reciprocity.  Recommend allowing meds with puree.  SLP to return later this afternoon for f/u.    Returned 12:45: three oz water test trials x2; pt with no clinical s/s of aspiration.  Consumed regular solids with slowed but effective mastication, despite absence of teeth.  Phonation good quality; strong cough.  Recommend beginning a regular diet, thin liquids.  SLP will f/u briefly to ensure toleration/safety.  Pt agrees with plan.     Aspiration Risk       Diet Recommendation   regular solids, thin liquids  Medication Administration: Whole meds with puree    Other  Recommendations Oral Care Recommendations: Oral care QID   Follow up Recommendations Regular diet, thin liquds     Frequency and Duration min 2x/week  2 weeks       Prognosis        Swallow Study   General Date of Onset: 05/02/16 HPI: 61yo female with hx CAD s/p CABG 2002, CKD V (not yet on HD), HTN, CHF (EF 40-45%) presented 1/18 with 1 week hx URI symptoms with worsening productive cough, SOB. In ER was hypoxic with sats 60's, febrile, placed on bipap. She was admitted by [facility] with PNA v pulmonary edema. She was started on IV abx and aggressive diuresis without significant improvement.  ETT 1/20-1/23 (self-extubated). Previous Swallow Assessment: no Respiratory Status: Nasal cannula (99%) History of Recent Intubation: Yes Length of Intubations (days): 3 days Date extubated: 05/07/16 Behavior/Cognition: Alert;Cooperative;Pleasant  mood Oral Care Completed by SLP: Yes Oral Cavity - Dentition: Edentulous Vision: Functional for self-feeding Baseline Vocal Quality: Normal Volitional Cough: Strong Volitional Swallow: Able to elicit    Oral/Motor/Sensory Function Overall Oral Motor/Sensory Function: Within functional limits   Ice Chips Ice chips: Within functional limits   Thin Liquid Thin Liquid: Within functional limits Presentation: Straw    Nectar Thick Nectar Thick Liquid: Not tested   Honey Thick Honey Thick Liquid: Not tested   Puree Puree: Within functional limits   Solid   GO   Solid: Within functional limits        Juan Quam Laurice 05/08/2016,2:09 PM  Estill Bamberg L. Tivis Ringer, [place] CCC/SLP Pager [phone]

## 2016-05-08 NOTE — Consult Note (Signed)
PULMONARY / CRITICAL CARE MEDICINE   Name: Brooke Stafford MRN: [id number] DOB: 11-17-1955    ADMISSION DATE:  05/02/2016 CONSULTATION DATE:  1/20  REFERRING MD:  Wendee Beavers ([facility])   CHIEF COMPLAINT:  Respiratory failure   HISTORY OF PRESENT ILLNESS:   61yo female with hx CAD s/p CABG 2002, CKD V (not yet on HD), HTN, CHF (EF 40-45%) presented 1/18 with 1 week hx URI symptoms with worsening productive cough, SOB.  In ER was hypoxic with sats 60's, febrile, placed on bipap.  She was admitted by [facility] with PNA v pulmonary edema. She was started on IV abx and aggressive diuresis without significant improvement.  She continued to require bipap and on the am of 1/20 had significantly increased WOB, minimal UOP despite multiple doses lasix and PCCM consulted.     SUBJECTIVE:  Patient has had 2 HD sessions. Yesterday patient self extubated and was placed on nonrebreather. This morning she is satting well on room air.   VITAL SIGNS: BP (!) 129/98   Pulse 75   Temp 99 F (37.2 C) (Oral)   Resp 17   Ht 5\' 10"  (1.778 m)   Wt 103.7 kg (228 lb 9.9 oz)   LMP 10/13/2014 (Exact Date)   SpO2 99%   BMI 32.80 kg/m   HEMODYNAMICS:    VENTILATOR SETTINGS: Vent Mode: PSV;CPAP FiO2 (%):  [40 %-100 %] 100 % Set Rate:  [24 bmp] 24 bmp Vt Set:  [550 mL] 550 mL PEEP:  [5 cmH20] 5 cmH20 Pressure Support:  [5 cmH20] 5 cmH20 Plateau Pressure:  [23 cmH20] 23 cmH20  INTAKE / OUTPUT: I/O last 3 completed shifts: In: 1349.1 [I.V.:569.1; Other:80; NG/GT:700] Out: 1914 [NWGNF:6213; YQMVH:8469]  PHYSICAL EXAMINATION:  General:  Chronically ill appearing female  Neuro:  A&O  HEENT:  [place]/AT, no JVD  Cardiovascular:  RRR, no m/g/r, trace edema BLE Lungs: CTA ascultation on anterior chest Abdomen:  Round, soft, non tender  Musculoskeletal:  able to move all four extremities Skin: Warm and dry     LABS:  BMET  Recent Labs Lab 05/06/16 0221 05/07/16 0450 05/08/16 0350  NA 139 140 140  K 4.4 4.1  4.1  CL 106 106 104  CO2 18* 23 26  BUN 83* 83* 62*  CREATININE 5.85* 4.72* 3.78*  GLUCOSE 352* 300* 179*    Electrolytes  Recent Labs Lab 05/05/16 0454 05/05/16 1627 05/06/16 0023 05/06/16 0221 05/07/16 0450 05/08/16 0350  CALCIUM 8.8*  --  8.6* 8.8* 8.5* 9.0  MG 2.2 2.2 2.2  --   --   --   PHOS 2.0* 2.2* 3.3  --  4.6 6.2*    CBC  Recent Labs Lab 05/06/16 0221 05/07/16 0450 05/08/16 0347  WBC 7.1 7.3 10.2  HGB 7.5* 7.6* 8.6*  HCT 23.5* 23.4* 27.3*  PLT 121* 147* 218    Coag's No results for input(s): APTT, INR in the last 168 hours.  Sepsis Markers  Recent Labs Lab 05/03/16 0328 05/05/16 0454 05/07/16 0450  PROCALCITON 0.69 14.61 8.99    ABG  Recent Labs Lab 05/04/16 1201  PHART 7.164*  PCO2ART 62.7*  PO2ART 68.0*    Liver Enzymes  Recent Labs Lab 05/07/16 0450 05/07/16 0812 05/08/16 0350  ALT  --  38  --   ALBUMIN 2.2*  --  2.4*    Cardiac Enzymes  Recent Labs Lab 05/03/16 1900  TROPONINI 0.60*    Glucose  Recent Labs Lab 05/07/16 1154 05/07/16 1607 05/07/16 1943 05/07/16  2354 05/08/16 0438 05/08/16 0722  GLUCAP 222* 209* 217* 196* 171* 153*    Imaging No results found.   STUDIES:  2D echo 1/20>>> LV EF: 35% -   27%, grade 2 diastolic dysfunction  CULTURES: RVP 1/19>>> NEG RVP (from tracheal aspirate) 1/20>>>no growth Sputum 1/20>>> no growth  ANTIBIOTICS: azithro 1/20>>>1/20 Vanc 1/20>> 1/22 Imipenem 1/20>>>1/23  SIGNIFICANT EVENTS: Intubated on 1/20.   Right IJ placed on 1/22 and started on hemodialysis. Self extubated 1/23 and placed on non-rebreather  LINES/TUBES: ETT 1/20>>> HD right IJ > 1/22  DISCUSSION: 61yo female with acute respiratory failure in setting acute on chronic CHF, CKD, PNA. Failed bipap, intubated 1/20.   ASSESSMENT / PLAN:  PULMONARY Acute hypoxic respiratory failure - multifactorial in setting probable PNA and acute on chronic CHF with underling COPD.  Did not respond to  diuresis.  Worsening CXR. Failed bipap.  P:    Self extubated and currently satting well on room air Stopped Meropenem Stopped vancomycin Sputum culture >> no growth BD's  CARDIOVASCULAR Acute on chronic combined CHF  Hx CAD - s/p CABG 2002, PCI 2016 and 2016 HTN P:    Hemodialysis, temp R IJ Asa, statin, plavix  Clonidine, amlodipine, metoprolol  Ezetimibe, lipitor   RENAL Acute on CKD V - baseline Scr ~4, improving with HD Hyperkalemia - resolved P:   Replace lytes as needed Hemodialysis, temp R IJ Appreciate nephrology's recommendations  GASTROINTESTINAL No active issue  P:   NPO   PPI   HEMATOLOGIC Thrombocytopenia > resolved  Normocytic anemia - hgb 8.6 - stable P:  lovenox  CBC in morning  Transfuse <7.0  INFECTIOUS ?PNA vs volume overload due to renal failure Elevated PCT - 14.61>>8.99, unable to interpret due to renal dysfunction P:   Meropenem stopped  Sputum culture >> No growth  Respiratory panel > non detected  ENDOCRINE DM - CBGs continues to be elevated  hypothyroid  P:   SSI, lantus  Continue synthroid   NEUROLOGIC Alert and speaking in complete sentences P:   Continue to monitor Speech for swallow eval   FAMILY  - Updates:  Sister and boyfriend updated at bedside 1/22  - Inter-disciplinary family meet or Palliative Care meeting due by: 1/27  Kalman Shan PGY-1 IMTS Pager: [phone]

## 2016-05-09 ENCOUNTER — Inpatient Hospital Stay (HOSPITAL_COMMUNITY): Payer: Medicare Other

## 2016-05-09 DIAGNOSIS — I5041 Acute combined systolic (congestive) and diastolic (congestive) heart failure: Secondary | ICD-10-CM

## 2016-05-09 DIAGNOSIS — N186 End stage renal disease: Secondary | ICD-10-CM

## 2016-05-09 LAB — RENAL FUNCTION PANEL
ANION GAP: 11 (ref 5–15)
Albumin: 2.6 g/dL — ABNORMAL LOW (ref 3.5–5.0)
BUN: 36 mg/dL — ABNORMAL HIGH (ref 6–20)
CALCIUM: 9.5 mg/dL (ref 8.9–10.3)
CHLORIDE: 102 mmol/L (ref 101–111)
CO2: 28 mmol/L (ref 22–32)
Creatinine, Ser: 2.83 mg/dL — ABNORMAL HIGH (ref 0.44–1.00)
GFR calc non Af Amer: 17 mL/min — ABNORMAL LOW (ref >60)
GFR, EST AFRICAN AMERICAN: 20 mL/min — AB (ref >60)
GLUCOSE: 126 mg/dL — AB (ref 65–99)
Phosphorus: 5.1 mg/dL — ABNORMAL HIGH (ref 2.5–4.6)
Potassium: 4.5 mmol/L (ref 3.5–5.1)
SODIUM: 141 mmol/L (ref 135–145)

## 2016-05-09 LAB — CBC
HEMATOCRIT: 30.8 % — AB (ref 36.0–46.0)
HEMOGLOBIN: 9.8 g/dL — AB (ref 12.0–15.0)
MCH: 30.6 pg (ref 26.0–34.0)
MCHC: 31.8 g/dL (ref 30.0–36.0)
MCV: 96.3 fL (ref 78.0–100.0)
Platelets: 265 10*3/uL (ref 150–400)
RBC: 3.2 MIL/uL — AB (ref 3.87–5.11)
RDW: 14.9 % (ref 11.5–15.5)
WBC: 12.1 10*3/uL — ABNORMAL HIGH (ref 4.0–10.5)

## 2016-05-09 LAB — GLUCOSE, CAPILLARY
GLUCOSE-CAPILLARY: 121 mg/dL — AB (ref 65–99)
GLUCOSE-CAPILLARY: 137 mg/dL — AB (ref 65–99)
GLUCOSE-CAPILLARY: 156 mg/dL — AB (ref 65–99)
GLUCOSE-CAPILLARY: 193 mg/dL — AB (ref 65–99)
GLUCOSE-CAPILLARY: 203 mg/dL — AB (ref 65–99)
GLUCOSE-CAPILLARY: 212 mg/dL — AB (ref 65–99)

## 2016-05-09 MED ORDER — IPRATROPIUM-ALBUTEROL 0.5-2.5 (3) MG/3ML IN SOLN
3.0000 mL | Freq: Two times a day (BID) | RESPIRATORY_TRACT | Status: DC
  Administered 2016-05-09 – 2016-05-10 (×2): 3 mL via RESPIRATORY_TRACT
  Filled 2016-05-09 (×2): qty 3

## 2016-05-09 MED ORDER — SODIUM CHLORIDE 0.9% FLUSH: 10.0000 mL | INTRAVENOUS | Status: DC | PRN

## 2016-05-09 MED ORDER — SODIUM CHLORIDE 0.9 % IV SOLN
125.0000 mg | INTRAVENOUS | Status: DC
  Administered 2016-05-11 – 2016-05-14 (×2): 125 mg via INTRAVENOUS
  Filled 2016-05-09 (×6): qty 10

## 2016-05-09 MED ORDER — NEPRO/CARBSTEADY PO LIQD
237.0000 mL | Freq: Two times a day (BID) | ORAL | Status: DC
  Administered 2016-05-10 – 2016-05-13 (×2): 237 mL via ORAL

## 2016-05-09 MED ORDER — DOXERCALCIFEROL 4 MCG/2ML IV SOLN
1.0000 ug | INTRAVENOUS | Status: DC
  Administered 2016-05-09: 1 ug via INTRAVENOUS
  Filled 2016-05-09: qty 2

## 2016-05-09 MED ORDER — DARBEPOETIN ALFA 200 MCG/0.4ML IJ SOSY
200.0000 ug | PREFILLED_SYRINGE | INTRAMUSCULAR | Status: DC
  Administered 2016-05-14: 200 ug via INTRAVENOUS
  Filled 2016-05-09: qty 0.4

## 2016-05-09 MED ORDER — DOXERCALCIFEROL 4 MCG/2ML IV SOLN
INTRAVENOUS | Status: AC
  Administered 2016-05-09: 15:00:00
  Filled 2016-05-09: qty 2

## 2016-05-09 MED ORDER — METOPROLOL TARTRATE 25 MG PO TABS
25.0000 mg | ORAL_TABLET | Freq: Two times a day (BID) | ORAL | Status: DC
  Administered 2016-05-09 – 2016-05-14 (×9): 25 mg via ORAL
  Filled 2016-05-09 (×10): qty 1

## 2016-05-09 MED ORDER — VANCOMYCIN HCL IN DEXTROSE 1-5 GM/200ML-% IV SOLN: 1000.0000 mg | INTRAVENOUS | Status: AC

## 2016-05-09 NOTE — Progress Notes (Signed)
PT Cancellation Note  Patient Details Name: MARIBETH JILES MRN: [id number] DOB: 11-22-1955   Cancelled Treatment:    Reason Eval/Treat Not Completed: Patient at procedure or test/unavailable   Alfred Harrel B Kewana Sanon 05/09/2016, 1:44 PM Elwyn Reach, [place]

## 2016-05-09 NOTE — Progress Notes (Signed)
Nutrition Follow-up  DOCUMENTATION CODES:   Obesity unspecified  INTERVENTION:  Provide Nepro Shake po BID, each supplement provides 425 kcal and 19 grams protein.  Encourage adequate PO intake.   NUTRITION DIAGNOSIS:   Increased nutrient needs related to chronic illness as evidenced by estimated needs; ongoing  GOAL:   Patient will meet greater than or equal to 90% of their needs; progressing  MONITOR:   PO intake, Supplement acceptance, Labs, Weight trends, Skin, I & O's  REASON FOR ASSESSMENT:   Ventilator, Consult Enteral/tube feeding initiation and management  ASSESSMENT:   61 y.o. female with a past medical history significant for CAD s/p CABG 2002, CHF EF 40-45%, hx CVA with R sided weakness (mild), CKD V not yet on HD, hypothyroidism, anemia and IDDM who presents with acute hypoxic respiratory failure. Pt self-extubated 1/23. AKI on advanced CKD 5. New ESRD Temp cath placed by CCM 1/22 and HD intitated 1/22.  Pt reports appetite is just "ok". No meal completion recorded, however pt reports eating most of her meal this AM. Pt reports eating well PTA with usual consumption of at least 3 meals a day. RD to order nutritional supplements to aid in caloric and protein needs. Pt agreeable. Plans for dialysis catheter tomorrow.   Labs and medications reviewed. Phosphorous elevated at 5.1.  Diet Order:  Diet NPO time specified Except for: Sips with Meds Diet NPO time specified Except for: Sips with Meds Diet renal/carb modified with fluid restriction Diet-HS Snack? Nothing; Room service appropriate? Yes; Fluid consistency: Thin  Skin:  Reviewed, no issues  Last BM:  1/24  Height:   Ht Readings from Last 1 Encounters:  05/04/16 5\' 10"  (1.778 m)    Weight:   Wt Readings from Last 1 Encounters:  05/09/16 218 lb 4.1 oz (99 kg)    Ideal Body Weight:  68.18 kg  BMI:  Body mass index is 31.32 kg/m.  Estimated Nutritional Needs:   Kcal:  2050-2250  Protein:   110-125 grams  Fluid:  Per MD  EDUCATION NEEDS:   No education needs identified at this time  Corrin Parker, MS, RD, LDN Pager # [phone] After hours/ weekend pager # [phone]

## 2016-05-09 NOTE — Progress Notes (Signed)
[facility] Rounding Note Subjective/Interval History:   Had 3rd HD yesterday Weight down to 99.5 kg post HD (from high of 111.5) so down 12 kg since admission O2 weaned down to 2L Still with some SOB esp w/exertion Agreeable to another HD today for volume management  Objective Vital signs in last 24 hours: Vitals:   05/09/16 0356 05/09/16 0534 05/09/16 0747 05/09/16 0922  BP: (!) 150/75 (!) 146/71  (!) 146/75  Pulse: 93 88  88  Resp: 20   16  Temp: 99.5 F (37.5 C)   97.7 F (36.5 C)  TempSrc:    Oral  SpO2: 100%  100% 100%  Weight:  99 kg (218 lb 4.1 oz)    Height:        Intake/Output Summary (Last 24 hours) at 05/09/16 1015 Last data filed at 05/09/16 0604  Gross per 24 hour  Intake               73 ml  Output             3025 ml  Net            -2952 ml   Physical Exam:  Blood pressure (!) 146/75, pulse 88, temperature 97.7 F (36.5 C), temperature source Oral, resp. rate 16, height '5\' 10"'  (1.778 m), weight 99 kg (218 lb 4.1 oz), last menstrual period 10/13/2014, SpO2 100 %.   R IJ trialysis cath w/dsg Hard to see neck veins Clearer lungs, but still w/few base crackles JVP ~ 5 cm S1S2 No S3  Abd soft, nondistended, nontender Trace edema LE's Awake, alert, baseline MS R IJ temp cath with dressing in place   Recent Labs Lab 05/04/16 1015 05/04/16 1658 05/05/16 0454 05/05/16 1627 05/06/16 0023 05/06/16 0221 05/07/16 0450 05/08/16 0350 05/09/16 0403  NA 138  --  139  --  137 139 140 140 141  K 4.8  --  5.1  --  4.6 4.4 4.1 4.1 4.5  CL 105  --  107  --  107 106 106 104 102  CO2 23  --  23  --  19* 18* '23 26 28  ' GLUCOSE 310*  --  247*  --  364* 352* 300* 179* 126*  BUN 58*  --  76*  --  84* 83* 83* 62* 36*  CREATININE 4.73*  --  5.55*  --  5.67* 5.85* 4.72* 3.78* 2.83*  CALCIUM 9.6  --  8.8*  --  8.6* 8.8* 8.5* 9.0 9.5  PHOS 5.3* 3.4 2.0* 2.2* 3.3  --  4.6 6.2* 5.1*    Recent Labs Lab 05/02/16 2145  05/06/16 0221 05/07/16 0450 05/08/16 0347  05/09/16 0403  WBC 7.9  < > 7.1 7.3 10.2 12.1*  NEUTROABS 4.5  --   --   --   --   --   HGB 11.9*  < > 7.5* 7.6* 8.6* 9.8*  HCT 35.8*  < > 23.5* 23.4* 27.3* 30.8*  MCV 93.2  < > 95.1 94.4 96.5 96.3  PLT 184  < > 121* 147* 218 265  < > = values in this interval not displayed.   Recent Labs Lab 05/03/16 1900  CKTOTAL 552*  CKMB 11.6*  TROPONINI 0.60*    Recent Labs Lab 05/08/16 1623 05/08/16 2007 05/08/16 2356 05/09/16 0355 05/09/16 0814  GLUCAP 165* 127* 134* 121* 137*   Iron/TIBC/Ferritin/ %Sat    Component Value Date/Time   IRON 25 (L) 05/06/2016 1000   TIBC 202 (L) 05/06/2016  1000   FERRITIN 95 04/18/2016 1318   IRONPCTSAT 12 05/06/2016 1000   Results for JAMIAYA, BINA (MRN [id number]) as of 05/07/2016 11:43  Ref. Range 05/06/2016 10:00  PTH Latest Ref Range: 15 - 65 pg/mL 542 (H)    Medications: . sodium chloride 10 mL/hr (05/07/16 1000)  . feeding supplement (VITAL HIGH PROTEIN) Stopped (05/07/16 1200)   . amLODipine  10 mg Oral QHS  . aspirin  81 mg Oral Daily  . atorvastatin  40 mg Oral Daily  . chlorhexidine gluconate (MEDLINE KIT)  15 mL Mouth Rinse BID  . cloNIDine  0.1 mg Oral TID  . clopidogrel  75 mg Oral QHS  . darbepoetin (ARANESP) injection - DIALYSIS  200 mcg Intravenous Q Mon-HD  . doxercalciferol  1 mcg Intravenous Q M,W,F-HD  . ezetimibe  10 mg Oral Daily  . feeding supplement (PRO-STAT SUGAR FREE 64)  30 mL Per Tube BID  . ferric gluconate (FERRLECIT/NULECIT) IV  125 mg Intravenous Q M,W,F-HD  . heparin subcutaneous  5,000 Units Subcutaneous Q8H  . insulin aspart  0-15 Units Subcutaneous Q4H  . insulin aspart  7 Units Subcutaneous Q4H  . insulin glargine  25 Units Subcutaneous QHS  . ipratropium-albuterol  3 mL Nebulization BID  . levothyroxine  25 mcg Oral QAC breakfast  . mouth rinse  15 mL Mouth Rinse QID  . metoprolol  5 mg Intravenous Q6H  . pantoprazole sodium  40 mg Per Tube Daily  . sodium chloride flush  3 mL Intravenous  Q12H  . vancomycin  1,000 mg Intravenous To SS-Surg    Background: 61yo female with hx CAD s/p CABG 2002, CKD V (not yet on HD), HTN, PVD, CHF (EF 40-45%) presented 1/18 with 1 week hx URI symptoms with worsening productive cough, SOB. Intubated for respiratory failure. Creatinine 5.85 w/BUN 80's. (I follow in the office, has had huge denial about dialysis, was resistant to VVS evaluation for vascular access as outpt). Uremic and volume overloaded and agreed to HD as of 05/06/16. Temp cath placed by CCM 1/22 and HD intitated 1/22.  TDC and permanent access needed, CLIP for outpt HD.   Assessment/Plan  1. AKI on advanced CKD 5. New ESRD. (Patient of mine - Known progressive CKD V w/multiple prior episodes AKI on CKD and large amount denial about dialysis-> as result no permanent access). S/p HD X 2 with about 8 kg reduction in volume. HD #4 today mostly for further vol reduction. CLIP probable [facility] TTS 2nd but not definite. VVS to place [facility] tomorrow, and if pulm improved then maybe perm access at the same time.   2. Anemia - Had not required ESA as outpt. Hb currently 8.6. Have started Aranesp with HD. (200 QMonday - change to QTuesday) TSat low. Now that off ATB's load with Fe.   3. Secondary HPT - PTH 542 outpt. Started 1 mcg hectorol with HD. Phos OK - no binders yet.   4. Vol overload: 11 liters of fluid off HD, 3 tmts so far. Much improved. HD today - 2L goal, CXR post HD.  5. Possible PNA:  ATB's stopped  6. Acute hypoxic respiratory failure: Extubated, doing well off the vent. O2 weaned to 2 liters  7. Acute sCHF exacerbation; EF 35-40% 05/04/16.  Volume reduction with HD has helped this.  8. HTN: on amlodipine/clonidine. Will need to reduce/hold doses to facilitate UF w/HD. Reduced clonidine to 0.1 mg, changed amlodipine to HS dosing   Brooke Maes, MD [facility] [phone] pager 05/09/2016, 10:15 AM

## 2016-05-09 NOTE — Progress Notes (Signed)
Accepted at [facility]) [address] Start Date and Time:Tuesday Jan. 30th at 11:45 1st treatment. Chairtime 12:50 .Pending  On Nephrology approval .

## 2016-05-09 NOTE — Care Management Important Message (Signed)
Important Message  Patient Details  Name: Brooke Stafford MRN: [id number] Date of Birth: 03-17-1956   Medicare Important Message Given:  Yes    Nathen May 05/09/2016, 11:39 AM

## 2016-05-09 NOTE — Progress Notes (Signed)
PROGRESS NOTE    Brooke Stafford  QAS:[id number] DOB: 06-30-1955 DOA: 05/02/2016 PCP: No primary care provider on file.   Brief Narrative: 62yo female with hx CAD s/p CABG 2002, CKD V (not yet on HD), HTN, CHF (EF 40-45%) presented 1/18 with 1 week hx URI symptoms with worsening productive cough, SOB.  In ER was hypoxic with sats 60's, febrile, placed on bipap.  She was admitted by [facility] with PNA v pulmonary edema. She was started on IV abx and aggressive diuresis without significant improvement.  She continued to require bipap and on the am of 1/20 had significantly increased WOB, minimal UOP despite multiple doses lasix and PCCM consulted.  patient was in ICU, intubated. She was self extubated, started on HD via temporary catheter and transferred to the floor.  SIGNIFICANT EVENTS: Intubated on 1/20.   Right IJ placed on 1/22 and started on hemodialysis. Self extubated 1/23 and placed on non-rebreather LINES/TUBES: HD right IJ > 1/22  Assessment & Plan:  # Acute hypoxic respiratory failure: Multifactorial etiology including probable pneumonia (unspecified) and acute on chronic congestive heart failure in underlying COPD: -Patient was intubated and self extubated. Patient is currently on 3-4 L of oxygen. Patient was treated with antibiotics which is discontinued now. Cultures are negative so far. Continue bronchodilators   # Acute on chronic combined congestive heart failure, patient has history of coronary artery disease status post CABG, PCI: Echocardiogram reviewed. Patient has EF of 35-40%. Also with grade 2 diastolic dysfunction. Patient is now hemodialysis dependent. Low-salt diet.  #Acute on chronic kidney disease is stage V: Patient is now new ESRD and requiring hemodialysis: Currently has a right IJ temporary catheter. Nephrology and vascular consult appreciated. Plan for tunneled catheter and the vein mapping as per vascular surgery. Continue to follow. Monitor electrolytes and blood  pressure closely.  #Normocytic anemia and thrombocytopenia: Hemoglobin is stable.. Count improved. No sign of bleeding. -on IV iron and ESA as per renal team.  #diabetes on chronic insulin: Continue current insulin regimen. Currently on Lantus and sliding scale. DC novolog 7 unit q4 hour.   #hypothyroidism: Continue Synthroid.  Principal Problem:   Acute respiratory failure with hypoxia [facility]) Active Problems:   CAD -S/P PCI June 2015 and 12/14/14   Hypothyroidism, acquired   CKD (chronic kidney disease) stage 5, GFR less than 15 ml/min (HCC)   Acute combined systolic and diastolic heart failure ([place])   Essential hypertension   Diabetes mellitus with circulatory complication, with long-term current use of insulin (HCC)   Anemia of chronic disease   Hypokalemia   COPD exacerbation ([place])  DVT prophylaxis:heparin sq Code Status:full code Family Communication: No family present at bedside Disposition Plan: Likely discharge home in 1-2 days. PT OT evaluation.    Consultants:   Nephrology  Vascular surgery  Procedures:  Right IJ placement 2D echo 1/20>>> LV EF: 35% - 79%, grade 2 diastolic dysfunction Antimicrobials: azithro 1/20>>>1/20 Vanc 1/20>> 1/22 Imipenem 1/20>>>1/23  Subjective: Patient was seen and examined at bedside. Patient reported weakness but denied any chest pain, shortness of breath, nausea or vomiting. Denied headache.  Objective: Vitals:   05/09/16 1440 05/09/16 1445 05/09/16 1500 05/09/16 1530  BP: (!) 145/84  (!) 151/111 (!) 120/102  Pulse: (!) 102 (!) 105 (!) 116 (!) 105  Resp: 17 20 (!) 22 17  Temp:    97.5 F (36.4 C)  TempSrc:    Oral  SpO2:      Weight:    95.8 kg (211  lb 3.2 oz)  Height:        Intake/Output Summary (Last 24 hours) at 05/09/16 1633 Last data filed at 05/09/16 1400  Gross per 24 hour  Intake               13 ml  Output             2700 ml  Net            -2687 ml   Filed Weights   05/08/16 1926 05/09/16 0534  05/09/16 1530  Weight: 99.5 kg (219 lb 5.7 oz) 99 kg (218 lb 4.1 oz) 95.8 kg (211 lb 3.2 oz)    Examination:  General exam: Appears calm and comfortable. Right IJ catheter site clean Respiratory system: Clear to auscultation. Respiratory effort normal. No wheezing or crackle Cardiovascular system: S1 & S2 heard, RRR.  No pedal edema. Gastrointestinal system: Abdomen is soft and nontender. Normal bowel sounds heard. Central nervous system: Alert and oriented. No focal neurological deficits. Extremities: Symmetric 5 x 5 power. Skin: No rashes, lesions or ulcers Psychiatry: Judgement and insight appear normal. Mood & affect appropriate.     Data Reviewed: I have personally reviewed following labs and imaging studies  CBC:  Recent Labs Lab 05/02/16 2145  05/05/16 0454 05/06/16 0221 05/07/16 0450 05/08/16 0347 05/09/16 0403  WBC 7.9  < > 8.6 7.1 7.3 10.2 12.1*  NEUTROABS 4.5  --   --   --   --   --   --   HGB 11.9*  < > 7.8* 7.5* 7.6* 8.6* 9.8*  HCT 35.8*  < > 24.0* 23.5* 23.4* 27.3* 30.8*  MCV 93.2  < > 94.9 95.1 94.4 96.5 96.3  PLT 184  < > 131* 121* 147* 218 265  < > = values in this interval not displayed. Basic Metabolic Panel:  Recent Labs Lab 05/04/16 1015 05/04/16 1658 05/05/16 0454 05/05/16 1627 05/06/16 0023 05/06/16 0221 05/07/16 0450 05/08/16 0350 05/09/16 0403  NA 138  --  139  --  137 139 140 140 141  K 4.8  --  5.1  --  4.6 4.4 4.1 4.1 4.5  CL 105  --  107  --  107 106 106 104 102  CO2 23  --  23  --  19* 18* '23 26 28  ' GLUCOSE 310*  --  247*  --  364* 352* 300* 179* 126*  BUN 58*  --  76*  --  84* 83* 83* 62* 36*  CREATININE 4.73*  --  5.55*  --  5.67* 5.85* 4.72* 3.78* 2.83*  CALCIUM 9.6  --  8.8*  --  8.6* 8.8* 8.5* 9.0 9.5  MG 2.4 2.1 2.2 2.2 2.2  --   --   --   --   PHOS 5.3* 3.4 2.0* 2.2* 3.3  --  4.6 6.2* 5.1*   GFR: Estimated Creatinine Clearance: 26.2 mL/min (by C-G formula based on SCr of 2.83 mg/dL (H)). Liver Function Tests:  Recent  Labs Lab 05/07/16 0450 05/07/16 0812 05/08/16 0350 05/09/16 0403  ALT  --  38  --   --   ALBUMIN 2.2*  --  2.4* 2.6*   No results for input(s): LIPASE, AMYLASE in the last 168 hours. No results for input(s): AMMONIA in the last 168 hours. Coagulation Profile: No results for input(s): INR, PROTIME in the last 168 hours. Cardiac Enzymes:  Recent Labs Lab 05/03/16 1900  CKTOTAL 552*  CKMB 11.6*  TROPONINI 0.60*  BNP (last 3 results) No results for input(s): PROBNP in the last 8760 hours. HbA1C: No results for input(s): HGBA1C in the last 72 hours. CBG:  Recent Labs Lab 05/08/16 2007 05/08/16 2356 05/09/16 0355 05/09/16 0814 05/09/16 1525  GLUCAP 127* 134* 121* 137* 156*   Lipid Profile:  Recent Labs  05/07/16 0450  TRIG 163*   Thyroid Function Tests: No results for input(s): TSH, T4TOTAL, FREET4, T3FREE, THYROIDAB in the last 72 hours. Anemia Panel: No results for input(s): VITAMINB12, FOLATE, FERRITIN, TIBC, IRON, RETICCTPCT in the last 72 hours. Sepsis Labs:  Recent Labs Lab 05/03/16 0328 05/05/16 0454 05/07/16 0450  PROCALCITON 0.69 14.61 8.99    Recent Results (from the past 240 hour(s))  MRSA PCR Screening     Status: None   Collection Time: 05/03/16  2:43 AM  Result Value Ref Range Status   MRSA by PCR NEGATIVE NEGATIVE Final    Comment:        The GeneXpert MRSA Assay (FDA approved for NASAL specimens only), is one component of a comprehensive MRSA colonization surveillance program. It is not intended to diagnose MRSA infection nor to guide or monitor treatment for MRSA infections.   Respiratory Panel by PCR     Status: None   Collection Time: 05/03/16  3:27 AM  Result Value Ref Range Status   Adenovirus NOT DETECTED NOT DETECTED Final   Coronavirus 229E NOT DETECTED NOT DETECTED Final   Coronavirus HKU1 NOT DETECTED NOT DETECTED Final   Coronavirus NL63 NOT DETECTED NOT DETECTED Final   Coronavirus OC43 NOT DETECTED NOT DETECTED  Final   Metapneumovirus NOT DETECTED NOT DETECTED Final   Rhinovirus / Enterovirus NOT DETECTED NOT DETECTED Final   Influenza A NOT DETECTED NOT DETECTED Final   Influenza B NOT DETECTED NOT DETECTED Final   Parainfluenza Virus 1 NOT DETECTED NOT DETECTED Final   Parainfluenza Virus 2 NOT DETECTED NOT DETECTED Final   Parainfluenza Virus 3 NOT DETECTED NOT DETECTED Final   Parainfluenza Virus 4 NOT DETECTED NOT DETECTED Final   Respiratory Syncytial Virus NOT DETECTED NOT DETECTED Final   Bordetella pertussis NOT DETECTED NOT DETECTED Final   Chlamydophila pneumoniae NOT DETECTED NOT DETECTED Final   Mycoplasma pneumoniae NOT DETECTED NOT DETECTED Final  Culture, respiratory (NON-Expectorated)     Status: None   Collection Time: 05/04/16 10:14 AM  Result Value Ref Range Status   Specimen Description TRACHEAL ASPIRATE  Final   Special Requests NONE  Final   Gram Stain   Final    MODERATE WBC PRESENT,BOTH PMN AND MONONUCLEAR NO ORGANISMS SEEN    Culture NO GROWTH 2 DAYS  Final   Report Status 05/06/2016 FINAL  Final         Radiology Studies: No results found.      Scheduled Meds: . amLODipine  10 mg Oral QHS  . aspirin  81 mg Oral Daily  . atorvastatin  40 mg Oral Daily  . chlorhexidine gluconate (MEDLINE KIT)  15 mL Mouth Rinse BID  . cloNIDine  0.1 mg Oral TID  . clopidogrel  75 mg Oral QHS  . [START ON 05/14/2016] darbepoetin (ARANESP) injection - DIALYSIS  200 mcg Intravenous Q Tue-HD  . doxercalciferol  1 mcg Intravenous Q T,Th,Sa-HD  . ezetimibe  10 mg Oral Daily  . feeding supplement (NEPRO CARB STEADY)  237 mL Oral BID BM  . feeding supplement (PRO-STAT SUGAR FREE 64)  30 mL Per Tube BID  . ferric gluconate (FERRLECIT/NULECIT) IV  125 mg Intravenous Q T,Th,Sa-HD  . heparin subcutaneous  5,000 Units Subcutaneous Q8H  . insulin aspart  0-15 Units Subcutaneous Q4H  . insulin glargine  25 Units Subcutaneous QHS  . ipratropium-albuterol  3 mL Nebulization BID    . levothyroxine  25 mcg Oral QAC breakfast  . mouth rinse  15 mL Mouth Rinse QID  . metoprolol  5 mg Intravenous Q6H  . pantoprazole sodium  40 mg Per Tube Daily  . sodium chloride flush  3 mL Intravenous Q12H  . vancomycin  1,000 mg Intravenous To SS-Surg   Continuous Infusions: . sodium chloride 10 mL/hr (05/07/16 1000)  . feeding supplement (VITAL HIGH PROTEIN) Stopped (05/07/16 1200)     LOS: 7 days    Shenetta Schnackenberg Tanna Furry, MD [facility] Pager [phone]  If 7PM-7AM, please contact night-coverage [email or web address] Password [facility] 05/09/2016, 4:33 PM

## 2016-05-09 NOTE — Progress Notes (Signed)
SLP Cancellation Note  Patient Details Name: Brooke Stafford MRN: [id number] DOB: 09/22/1955   Cancelled treatment:        Pt was in a procedure (dialysis) and unable to be seen this date; will attempt as schedule permits.                                                                                                  Arushi Partridge,PAT, M.S., CCC-SLP 05/09/2016, 4:16 PM

## 2016-05-09 NOTE — Progress Notes (Signed)
[facility]  Subjective  - feels congested again today   Objective (!) 146/75 88 97.7 F (36.5 C) (Oral) 16 100%  Intake/Output Summary (Last 24 hours) at 05/09/16 0945 Last data filed at 05/09/16 0604  Gross per 24 hour  Intake               83 ml  Output             3125 ml  Net            -3042 ml     Assessment/Planning: Vein map pending Will plan to place palindrome catheter tomorrow will decide tomorrow morning whether or not to proceed with access at the same setting depending on vein mapping and respiratory status  Dr Donnetta Hutching will be doing procedure  Brooke Stafford 05/09/2016 9:45 AM --  Laboratory Lab Results:  Recent Labs  05/08/16 0347 05/09/16 0403  WBC 10.2 12.1*  HGB 8.6* 9.8*  HCT 27.3* 30.8*  PLT 218 265   BMET  Recent Labs  05/08/16 0350 05/09/16 0403  NA 140 141  K 4.1 4.5  CL 104 102  CO2 26 28  GLUCOSE 179* 126*  BUN 62* 36*  CREATININE 3.78* 2.83*  CALCIUM 9.0 9.5    COAG Lab Results  Component Value Date   INR 0.91 12/17/2015   INR 1.02 12/06/2014   INR 0.89 01/25/2014   No results found for: PTT

## 2016-05-10 ENCOUNTER — Encounter (HOSPITAL_COMMUNITY): Payer: Self-pay | Admitting: Anesthesiology

## 2016-05-10 ENCOUNTER — Encounter (HOSPITAL_COMMUNITY): Payer: Medicare Other

## 2016-05-10 ENCOUNTER — Inpatient Hospital Stay (HOSPITAL_COMMUNITY): Payer: Medicare Other | Admitting: Anesthesiology

## 2016-05-10 ENCOUNTER — Telehealth: Payer: Self-pay | Admitting: Vascular Surgery

## 2016-05-10 ENCOUNTER — Inpatient Hospital Stay (HOSPITAL_COMMUNITY): Payer: Medicare Other

## 2016-05-10 ENCOUNTER — Encounter (HOSPITAL_COMMUNITY)
Admission: EM | Disposition: A | Discharge status: Skilled Nursing Facility | Payer: Self-pay | Source: Home / Self Care | Attending: Family Medicine

## 2016-05-10 HISTORY — PX: INSERTION OF DIALYSIS CATHETER: SHX1324

## 2016-05-10 HISTORY — PX: AV FISTULA PLACEMENT: SHX1204

## 2016-05-10 LAB — RENAL FUNCTION PANEL
ANION GAP: 12 (ref 5–15)
Albumin: 2.8 g/dL — ABNORMAL LOW (ref 3.5–5.0)
BUN: 34 mg/dL — ABNORMAL HIGH (ref 6–20)
CALCIUM: 9.5 mg/dL (ref 8.9–10.3)
CO2: 27 mmol/L (ref 22–32)
Chloride: 100 mmol/L — ABNORMAL LOW (ref 101–111)
Creatinine, Ser: 3.66 mg/dL — ABNORMAL HIGH (ref 0.44–1.00)
GFR calc Af Amer: 14 mL/min — ABNORMAL LOW (ref >60)
GFR calc non Af Amer: 12 mL/min — ABNORMAL LOW (ref >60)
GLUCOSE: 169 mg/dL — AB (ref 65–99)
Phosphorus: 5.9 mg/dL — ABNORMAL HIGH (ref 2.5–4.6)
Potassium: 3.2 mmol/L — ABNORMAL LOW (ref 3.5–5.1)
SODIUM: 139 mmol/L (ref 135–145)

## 2016-05-10 LAB — PROTIME-INR
INR: 1.16
PROTHROMBIN TIME: 14.8 s (ref 11.4–15.2)

## 2016-05-10 LAB — GLUCOSE, CAPILLARY
GLUCOSE-CAPILLARY: 159 mg/dL — AB (ref 65–99)
GLUCOSE-CAPILLARY: 186 mg/dL — AB (ref 65–99)
GLUCOSE-CAPILLARY: 195 mg/dL — AB (ref 65–99)
GLUCOSE-CAPILLARY: 235 mg/dL — AB (ref 65–99)
GLUCOSE-CAPILLARY: 208 mg/dL — AB (ref 65–99)
Glucose-Capillary: 162 mg/dL — ABNORMAL HIGH (ref 65–99)

## 2016-05-10 LAB — CBC
HCT: 34.2 % — ABNORMAL LOW (ref 36.0–46.0)
HEMOGLOBIN: 10.9 g/dL — AB (ref 12.0–15.0)
MCH: 30.7 pg (ref 26.0–34.0)
MCHC: 31.9 g/dL (ref 30.0–36.0)
MCV: 96.3 fL (ref 78.0–100.0)
Platelets: 258 10*3/uL (ref 150–400)
RBC: 3.55 MIL/uL — ABNORMAL LOW (ref 3.87–5.11)
RDW: 15 % (ref 11.5–15.5)
WBC: 11.3 10*3/uL — ABNORMAL HIGH (ref 4.0–10.5)

## 2016-05-10 SURGERY — INSERTION OF DIALYSIS CATHETER
Anesthesia: Monitor Anesthesia Care | Site: Chest | Laterality: Right

## 2016-05-10 MED ORDER — MIDAZOLAM HCL 2 MG/2ML IJ SOLN: 0.5000 mg | Freq: Once | INTRAMUSCULAR | Status: DC | PRN

## 2016-05-10 MED ORDER — FENTANYL CITRATE (PF) 100 MCG/2ML IJ SOLN
INTRAMUSCULAR | Status: DC | PRN
  Administered 2016-05-10: 25 ug via INTRAVENOUS
  Administered 2016-05-10: 50 ug via INTRAVENOUS
  Administered 2016-05-10: 25 ug via INTRAVENOUS

## 2016-05-10 MED ORDER — LIDOCAINE-EPINEPHRINE (PF) 1 %-1:200000 IJ SOLN
INTRAMUSCULAR | Status: DC | PRN
  Administered 2016-05-10: 30 mL

## 2016-05-10 MED ORDER — MEPERIDINE HCL 25 MG/ML IJ SOLN: 6.2500 mg | INTRAMUSCULAR | Status: DC | PRN

## 2016-05-10 MED ORDER — HEPARIN SODIUM (PORCINE) 1000 UNIT/ML IJ SOLN
INTRAMUSCULAR | Status: AC
  Filled 2016-05-10: qty 1

## 2016-05-10 MED ORDER — LIDOCAINE 2% (20 MG/ML) 5 ML SYRINGE
INTRAMUSCULAR | Status: AC
  Filled 2016-05-10: qty 10

## 2016-05-10 MED ORDER — HEPARIN SODIUM (PORCINE) 1000 UNIT/ML IJ SOLN
INTRAMUSCULAR | Status: DC | PRN
  Administered 2016-05-10: 1000 [IU]

## 2016-05-10 MED ORDER — FENTANYL CITRATE (PF) 100 MCG/2ML IJ SOLN
INTRAMUSCULAR | Status: AC
Start: 2016-05-10 — End: 2016-05-10
  Filled 2016-05-10: qty 4

## 2016-05-10 MED ORDER — LIDOCAINE HCL (PF) 0.5 % IJ SOLN
INTRAMUSCULAR | Status: AC
  Filled 2016-05-10: qty 50

## 2016-05-10 MED ORDER — MIDAZOLAM HCL 2 MG/2ML IJ SOLN
INTRAMUSCULAR | Status: AC
  Filled 2016-05-10: qty 2

## 2016-05-10 MED ORDER — 0.9 % SODIUM CHLORIDE (POUR BTL) OPTIME
TOPICAL | Status: DC | PRN
  Administered 2016-05-10: 1000 mL

## 2016-05-10 MED ORDER — HEPARIN SODIUM (PORCINE) 5000 UNIT/ML IJ SOLN
INTRAMUSCULAR | Status: DC | PRN
  Administered 2016-05-10: 10:00:00

## 2016-05-10 MED ORDER — LIDOCAINE-EPINEPHRINE (PF) 1 %-1:200000 IJ SOLN
INTRAMUSCULAR | Status: AC
  Filled 2016-05-10: qty 30

## 2016-05-10 MED ORDER — HEPARIN SODIUM (PORCINE) 5000 UNIT/ML IJ SOLN
5000.0000 [IU] | Freq: Three times a day (TID) | INTRAMUSCULAR | Status: DC
  Administered 2016-05-10 – 2016-05-14 (×12): 5000 [IU] via SUBCUTANEOUS
  Filled 2016-05-10 (×10): qty 1

## 2016-05-10 MED ORDER — INSULIN ASPART 100 UNIT/ML ~~LOC~~ SOLN
0.0000 [IU] | Freq: Every day | SUBCUTANEOUS | Status: DC
  Administered 2016-05-10: 2 [IU] via SUBCUTANEOUS
  Administered 2016-05-11: 3 [IU] via SUBCUTANEOUS
  Administered 2016-05-12: 2 [IU] via SUBCUTANEOUS
  Administered 2016-05-13: 3 [IU] via SUBCUTANEOUS

## 2016-05-10 MED ORDER — IOPAMIDOL (ISOVUE-300) INJECTION 61%
INTRAVENOUS | Status: AC
  Filled 2016-05-10: qty 50

## 2016-05-10 MED ORDER — SODIUM CHLORIDE 0.9 % IV SOLN
INTRAVENOUS | Status: DC
  Administered 2016-05-10: 09:00:00 via INTRAVENOUS

## 2016-05-10 MED ORDER — PROPOFOL 10 MG/ML IV BOLUS
INTRAVENOUS | Status: AC
  Filled 2016-05-10: qty 40

## 2016-05-10 MED ORDER — FENTANYL CITRATE (PF) 100 MCG/2ML IJ SOLN: 25.0000 ug | INTRAMUSCULAR | Status: DC | PRN

## 2016-05-10 MED ORDER — INSULIN ASPART 100 UNIT/ML ~~LOC~~ SOLN
0.0000 [IU] | Freq: Three times a day (TID) | SUBCUTANEOUS | Status: DC
  Administered 2016-05-10 – 2016-05-12 (×6): 3 [IU] via SUBCUTANEOUS
  Administered 2016-05-13: 2 [IU] via SUBCUTANEOUS
  Administered 2016-05-13: 3 [IU] via SUBCUTANEOUS
  Administered 2016-05-13 – 2016-05-14 (×2): 5 [IU] via SUBCUTANEOUS
  Administered 2016-05-14: 1 [IU] via SUBCUTANEOUS
  Administered 2016-05-14: 2 [IU] via SUBCUTANEOUS

## 2016-05-10 MED ORDER — PROPOFOL 500 MG/50ML IV EMUL
INTRAVENOUS | Status: DC | PRN
  Administered 2016-05-10: 100 ug/kg/min via INTRAVENOUS

## 2016-05-10 MED ORDER — PHENYLEPHRINE HCL 10 MG/ML IJ SOLN
INTRAVENOUS | Status: DC | PRN
  Administered 2016-05-10: 25 ug/min via INTRAVENOUS

## 2016-05-10 MED ORDER — PROMETHAZINE HCL 25 MG/ML IJ SOLN: 6.2500 mg | INTRAMUSCULAR | Status: DC | PRN

## 2016-05-10 MED ORDER — SODIUM CHLORIDE 0.9 % IV SOLN
INTRAVENOUS | Status: DC | PRN
  Administered 2016-05-10: 1000 mg via INTRAVENOUS

## 2016-05-10 SURGICAL SUPPLY — 56 items
ADH SKN CLS APL DERMABOND .7 (GAUZE/BANDAGES/DRESSINGS) ×4
ARMBAND PINK RESTRICT EXTREMIT (MISCELLANEOUS) ×8 IMPLANT
BAG DECANTER FOR FLEXI CONT (MISCELLANEOUS) ×4 IMPLANT
BIOPATCH RED 1 DISK 7.0 (GAUZE/BANDAGES/DRESSINGS) ×3 IMPLANT
BIOPATCH RED 1IN DISK 7.0MM (GAUZE/BANDAGES/DRESSINGS) ×1
CANISTER SUCTION 2500CC (MISCELLANEOUS) ×4 IMPLANT
CANNULA VESSEL 3MM 2 BLNT TIP (CANNULA) ×4 IMPLANT
CATH PALINDROME RT-P 15FX23CM (CATHETERS) ×4 IMPLANT
CLIP LIGATING EXTRA MED SLVR (CLIP) ×4 IMPLANT
CLIP LIGATING EXTRA SM BLUE (MISCELLANEOUS) ×4 IMPLANT
COVER PROBE W GEL 5X96 (DRAPES) ×4 IMPLANT
COVER SURGICAL LIGHT HANDLE (MISCELLANEOUS) ×4 IMPLANT
DECANTER SPIKE VIAL GLASS SM (MISCELLANEOUS) ×4 IMPLANT
DERMABOND ADVANCED (GAUZE/BANDAGES/DRESSINGS) ×4
DERMABOND ADVANCED .7 DNX12 (GAUZE/BANDAGES/DRESSINGS) ×4 IMPLANT
DRAPE C-ARM 42X72 X-RAY (DRAPES) ×4 IMPLANT
DRAPE CHEST BREAST 15X10 FENES (DRAPES) ×4 IMPLANT
ELECT REM PT RETURN 9FT ADLT (ELECTROSURGICAL) ×4
ELECTRODE REM PT RTRN 9FT ADLT (ELECTROSURGICAL) ×2 IMPLANT
GAUZE SPONGE 4X4 12PLY STRL (GAUZE/BANDAGES/DRESSINGS) ×4 IMPLANT
GLOVE BIO SURGEON STRL SZ 6.5 (GLOVE) ×6 IMPLANT
GLOVE BIO SURGEONS STRL SZ 6.5 (GLOVE) ×2
GLOVE BIOGEL PI IND STRL 6.5 (GLOVE) ×4 IMPLANT
GLOVE BIOGEL PI IND STRL 7.0 (GLOVE) ×4 IMPLANT
GLOVE BIOGEL PI IND STRL 7.5 (GLOVE) ×2 IMPLANT
GLOVE BIOGEL PI INDICATOR 6.5 (GLOVE) ×4
GLOVE BIOGEL PI INDICATOR 7.0 (GLOVE) ×4
GLOVE BIOGEL PI INDICATOR 7.5 (GLOVE) ×2
GLOVE ECLIPSE 6.5 STRL STRAW (GLOVE) ×8 IMPLANT
GLOVE ECLIPSE 7.0 STRL STRAW (GLOVE) ×4 IMPLANT
GLOVE SS BIOGEL STRL SZ 7.5 (GLOVE) ×2 IMPLANT
GLOVE SUPERSENSE BIOGEL SZ 7.5 (GLOVE) ×2
GOWN STRL REUS W/ TWL LRG LVL3 (GOWN DISPOSABLE) ×10 IMPLANT
GOWN STRL REUS W/TWL LRG LVL3 (GOWN DISPOSABLE) ×20
KIT BASIN OR (CUSTOM PROCEDURE TRAY) ×4 IMPLANT
KIT ROOM TURNOVER OR (KITS) ×4 IMPLANT
NEEDLE 18GX1X1/2 (RX/OR ONLY) (NEEDLE) ×4 IMPLANT
NEEDLE HYPO 25GX1X1/2 BEV (NEEDLE) ×4 IMPLANT
NS IRRIG 1000ML POUR BTL (IV SOLUTION) ×4 IMPLANT
PACK CV ACCESS (CUSTOM PROCEDURE TRAY) ×4 IMPLANT
PAD ARMBOARD 7.5X6 YLW CONV (MISCELLANEOUS) ×8 IMPLANT
SOAP 2 % CHG 4 OZ (WOUND CARE) ×4 IMPLANT
SUT ETHILON 3 0 PS 1 (SUTURE) ×4 IMPLANT
SUT PROLENE 6 0 CC (SUTURE) ×4 IMPLANT
SUT VIC AB 3-0 SH 27 (SUTURE) ×4
SUT VIC AB 3-0 SH 27X BRD (SUTURE) ×2 IMPLANT
SUT VICRYL 4-0 PS2 18IN ABS (SUTURE) ×4 IMPLANT
SYR 20CC LL (SYRINGE) ×4 IMPLANT
SYR 5ML LL (SYRINGE) ×8 IMPLANT
SYR BULB IRRIGATION 50ML (SYRINGE) ×4 IMPLANT
SYR CONTROL 10ML LL (SYRINGE) IMPLANT
SYRINGE 10CC LL (SYRINGE) ×4 IMPLANT
TOWEL OR 17X24 6PK STRL BLUE (TOWEL DISPOSABLE) ×4 IMPLANT
TOWEL OR 17X26 10 PK STRL BLUE (TOWEL DISPOSABLE) ×4 IMPLANT
UNDERPAD 30X30 (UNDERPADS AND DIAPERS) ×4 IMPLANT
WATER STERILE IRR 1000ML POUR (IV SOLUTION) ×4 IMPLANT

## 2016-05-10 NOTE — Anesthesia Postprocedure Evaluation (Signed)
Anesthesia Post Note  Patient: Brooke Stafford  Procedure(s) Performed: Procedure(s) (LRB): INSERTION OF DIALYSIS CATHETER - Right Internal Jugular Placement (Right) LEFT ARM ARTERIOVENOUS (AV) FISTULA CREATION (Left)  Patient location during evaluation: PACU Anesthesia Type: MAC Level of consciousness: awake and alert, patient cooperative and oriented Pain management: pain level controlled Vital Signs Assessment: post-procedure vital signs reviewed and stable Respiratory status: spontaneous breathing, nonlabored ventilation and respiratory function stable Cardiovascular status: blood pressure returned to baseline and stable Postop Assessment: no signs of nausea or vomiting Anesthetic complications: no       Last Vitals:  Vitals:   05/10/16 1245 05/10/16 1346  BP: (!) 153/64 (!) 147/61  Pulse: 90 82  Resp: 15 16  Temp: 36.8 C 36.3 C    Last Pain:  Vitals:   05/10/16 1245  TempSrc:   PainSc: 0-No pain                 Camry Theiss,E. Lequan Dobratz

## 2016-05-10 NOTE — Anesthesia Preprocedure Evaluation (Deleted)
Anesthesia Evaluation  Patient identified by MRN, date of birth, ID band Patient awake    Airway Mallampati: II  TM Distance: >3 FB Neck ROM: Full    Dental  (+) Edentulous Upper, Edentulous Lower   Pulmonary former smoker,    breath sounds clear to auscultation       Cardiovascular hypertension,      Neuro/Psych    GI/Hepatic   Endo/Other  diabetes  Renal/GU      Musculoskeletal   Abdominal   Peds  Hematology   Anesthesia Other Findings   Reproductive/Obstetrics                             Anesthesia Physical Anesthesia Plan Anesthesia Quick Evaluation

## 2016-05-10 NOTE — H&P (View-Only) (Signed)
[facility] [place]  Subjective  - feels congested again today   Objective (!) 146/75 88 97.7 F (36.5 C) (Oral) 16 100%  Intake/Output Summary (Last 24 hours) at 05/09/16 0945 Last data filed at 05/09/16 0604  Gross per 24 hour  Intake               83 ml  Output             3125 ml  Net            -3042 ml     Assessment/Planning: Vein map pending Will plan to place palindrome catheter tomorrow will decide tomorrow morning whether or not to proceed with access at the same setting depending on vein mapping and respiratory status  Dr Donnetta Hutching will be doing procedure  Ruta Hinds 05/09/2016 9:45 AM --  Laboratory Lab Results:  Recent Labs  05/08/16 0347 05/09/16 0403  WBC 10.2 12.1*  HGB 8.6* 9.8*  HCT 27.3* 30.8*  PLT 218 265   BMET  Recent Labs  05/08/16 0350 05/09/16 0403  NA 140 141  K 4.1 4.5  CL 104 102  CO2 26 28  GLUCOSE 179* 126*  BUN 62* 36*  CREATININE 3.78* 2.83*  CALCIUM 9.0 9.5    COAG Lab Results  Component Value Date   INR 0.91 12/17/2015   INR 1.02 12/06/2014   INR 0.89 01/25/2014   No results found for: PTT

## 2016-05-10 NOTE — Telephone Encounter (Signed)
-----   Message from Mena Goes, RN sent at 05/10/2016 11:36 AM EST ----- Regarding: 4 weeks w/ duplex   ----- Message ----- From: Gabriel Earing, PA-C Sent: 05/10/2016  11:27 AM To: Vvs Charge Pool  S/p 1st stage left BVT--f/u with Dr. Donnetta Hutching in 4 weeks with duplex to evaluate for 2nd stage BVT.  Thanks, Aldona Bar

## 2016-05-10 NOTE — Telephone Encounter (Signed)
LVM for appts, will mail letter for appts 2/16 [place] 2/20 PO

## 2016-05-10 NOTE — Progress Notes (Signed)
[facility] Rounding Note Subjective/Interval History:   HD X 4 this adm Weight down total 111.5->95.8 S/p R TDC and 1st stage L brachiobasilic AVF today (Dr. Donnetta Hutching) For HD tomorrow Has out pt TTS schedule at [facility]   Objective Vital signs in last 24 hours: Vitals:   05/10/16 1215 05/10/16 1230 05/10/16 1245 05/10/16 1346  BP: (!) 150/78 (!) 165/77 (!) 153/64 (!) 147/61  Pulse: 87 89 90 82  Resp: '15 16 15 16  ' Temp:   98.3 F (36.8 C) 97.4 F (36.3 C)  TempSrc:      SpO2: 100% 100% 100% 100%  Weight:      Height:        Intake/Output Summary (Last 24 hours) at 05/10/16 1456 Last data filed at 05/10/16 1348  Gross per 24 hour  Intake              500 ml  Output              595 ml  Net              -95 ml   Physical Exam:  Blood pressure (!) 147/61, pulse 82, temperature 97.4 F (36.3 C), resp. rate 16, height '5\' 10"'  (1.778 m), weight 98 kg (216 lb 0.8 oz), last menstrual period 10/13/2014, SpO2 100 %.   New R TDC L upper AVF + bruit  Hard to see neck veins Clearer lungs JVP ~ 5 cm S1S2 No S3  Abd soft, nondistended, nontender Minimal if any LE edema Awake, alert, baseline MS   Recent Labs Lab 05/05/16 0454 05/05/16 1627 05/06/16 0023 05/06/16 0221 05/07/16 0450 05/08/16 0350 05/09/16 0403 05/10/16 0603  NA 139  --  137 139 140 140 141 139  K 5.1  --  4.6 4.4 4.1 4.1 4.5 3.2*  CL 107  --  107 106 106 104 102 100*  CO2 23  --  19* 18* '23 26 28 27  ' GLUCOSE 247*  --  364* 352* 300* 179* 126* 169*  BUN 76*  --  84* 83* 83* 62* 36* 34*  CREATININE 5.55*  --  5.67* 5.85* 4.72* 3.78* 2.83* 3.66*  CALCIUM 8.8*  --  8.6* 8.8* 8.5* 9.0 9.5 9.5  PHOS 2.0* 2.2* 3.3  --  4.6 6.2* 5.1* 5.9*    Recent Labs Lab 05/07/16 0450 05/08/16 0347 05/09/16 0403 05/10/16 0603  WBC 7.3 10.2 12.1* 11.3*  HGB 7.6* 8.6* 9.8* 10.9*  HCT 23.4* 27.3* 30.8* 34.2*  MCV 94.4 96.5 96.3 96.3  PLT 147* 218 265 258    Recent Labs Lab 05/03/16 1900  CKTOTAL 552*  CKMB 11.6*  TROPONINI  0.60*    Recent Labs Lab 05/09/16 2351 05/10/16 0435 05/10/16 0747 05/10/16 1145 05/10/16 1330  GLUCAP 203* 162* 159* 186* 195*   Iron/TIBC/Ferritin/ %Sat    Component Value Date/Time   IRON 25 (L) 05/06/2016 1000   TIBC 202 (L) 05/06/2016 1000   FERRITIN 95 04/18/2016 1318   IRONPCTSAT 12 05/06/2016 1000   Results for Brooke Stafford, Brooke Stafford (MRN [id number]) as of 05/07/2016 11:43  Ref. Range 05/06/2016 10:00  PTH Latest Ref Range: 15 - 65 pg/mL 542 (H)    Medications: . sodium chloride 10 mL/hr (05/07/16 1000)  . sodium chloride 10 mL/hr at 05/10/16 0919  . feeding supplement (VITAL HIGH PROTEIN) Stopped (05/07/16 1200)   . amLODipine  10 mg Oral QHS  . aspirin  81 mg Oral Daily  . atorvastatin  40 mg Oral Daily  .  chlorhexidine gluconate (MEDLINE KIT)  15 mL Mouth Rinse BID  . cloNIDine  0.1 mg Oral TID  . clopidogrel  75 mg Oral QHS  . [START ON 05/14/2016] darbepoetin (ARANESP) injection - DIALYSIS  200 mcg Intravenous Q Tue-HD  . doxercalciferol  1 mcg Intravenous Q T,Th,Sa-HD  . ezetimibe  10 mg Oral Daily  . feeding supplement (NEPRO CARB STEADY)  237 mL Oral BID BM  . feeding supplement (PRO-STAT SUGAR FREE 64)  30 mL Per Tube BID  . ferric gluconate (FERRLECIT/NULECIT) IV  125 mg Intravenous Q T,Th,Sa-HD  . heparin subcutaneous  5,000 Units Subcutaneous Q8H  . insulin aspart  0-5 Units Subcutaneous QHS  . insulin aspart  0-9 Units Subcutaneous TID WC  . insulin glargine  25 Units Subcutaneous QHS  . ipratropium-albuterol  3 mL Nebulization BID  . levothyroxine  25 mcg Oral QAC breakfast  . metoprolol tartrate  25 mg Oral BID  . pantoprazole sodium  40 mg Per Tube Daily  . sodium chloride flush  3 mL Intravenous Q12H    Background: 61yo female with hx CAD s/p CABG 2002, CKD V (not yet on HD), HTN, PVD, CHF (EF 40-45%) presented 1/18 with 1 week hx URI symptoms with worsening productive cough, SOB. Intubated for respiratory failure. Creatinine 5.85 w/BUN 80's. (I  follow in the office, has had huge denial about dialysis, was resistant to VVS evaluation for vascular access as outpt). Uremic and volume overloaded and agreed to HD as of 05/06/16. Temp cath placed by CCM 1/22 and HD intitated 1/22.  TDC and permanent access needed, CLIP for outpt HD.   Assessment/Plan  1. AKI on advanced CKD 5. New ESRD. (Patient of mine - Known progressive CKD V w/multiple prior episodes AKI on CKD and large amount denial about dialysis-> as result no permanent access). S/p HD X4. CLIP [facility] TTS 2nd.  Has permanent access. TDC and L brachioceph AVF today (Early) HD tomorrow.   2. Anemia - Started Aranesp with HD. (200 QMonday - changed to QTuesday) TSat low. Fe load started 1/24.   3. Secondary HPT - PTH 542 outpt. Started 1 mcg hectorol with HD. Phos OK - no binders yet.   4. Possible PNA:  ATB's stopped  5. Acute hypoxic respiratory failure: Resolved. Was mostly volume issue.  6. Acute sCHF exacerbation; EF 35-40% 05/04/16.  Volume reduction with HD has helped this.  7. HTN: on amlodipine/clonidine. Reducing doses to facilitate UF w/HD. Reduced clonidine to 0.1 mg, changed amlodipine to HS dosing   Jamal Maes, MD [facility] [phone] pager 05/10/2016, 2:56 PM

## 2016-05-10 NOTE — Transfer of Care (Signed)
Immediate Anesthesia Transfer of Care Note  Patient: Brooke Stafford  Procedure(s) Performed: Procedure(s): INSERTION OF DIALYSIS CATHETER - Right Internal Jugular Placement (Right) LEFT ARM ARTERIOVENOUS (AV) FISTULA CREATION (Left)  Patient Location: PACU  Anesthesia Type:MAC  Level of Consciousness: awake, alert  and oriented  Airway & Oxygen Therapy: Patient Spontanous Breathing and Patient connected to nasal cannula oxygen  Post-op Assessment: Report given to RN and Post -op Vital signs reviewed and stable  Post vital signs: Reviewed and stable  Last Vitals:  Vitals:   05/10/16 0437 05/10/16 1140  BP: 138/78 (!) 163/81  Pulse: 93 90  Resp: 18 (!) 21  Temp: 37.3 C 36.8 C    Last Pain:  Vitals:   05/10/16 1140  TempSrc:   PainSc: 0-No pain         Complications: No apparent anesthesia complications

## 2016-05-10 NOTE — Interval H&P Note (Signed)
History and Physical Interval Note:  05/10/2016 9:50 AM  Brooke Stafford  has presented today for surgery, with the diagnosis of Stage V Chronic Kidney Disease N18.5  The various methods of treatment have been discussed with the patient and family. After consideration of risks, benefits and other options for treatment, the patient has consented to  Procedure(s): INSERTION OF DIALYSIS CATHETER (N/A) ARTERIOVENOUS (AV) FISTULA CREATION VERSUS GRAFT INSERTION (Left) as a surgical intervention .  The patient's history has been reviewed, patient examined, no change in status, stable for surgery.  I have reviewed the patient's chart and labs.  Questions were answered to the patient's satisfaction.     Curt Jews

## 2016-05-10 NOTE — Progress Notes (Signed)
PROGRESS NOTE    Brooke Stafford  BSJ:[id number] DOB: April 22, 1955 DOA: 05/02/2016 PCP: No primary care provider on file.   Brief Narrative: 61yo female with hx CAD s/p CABG 2002, CKD V (not yet on HD), HTN, CHF (EF 40-45%) presented 1/18 with 1 week hx URI symptoms with worsening productive cough, SOB.  In ER was hypoxic with sats 60's, febrile, placed on bipap.  She was admitted by [facility] with PNA v pulmonary edema. She was started on IV abx and aggressive diuresis without significant improvement.  She continued to require bipap and on the am of 1/20 had significantly increased WOB, minimal UOP despite multiple doses lasix and PCCM consulted.  patient was in ICU, intubated. She was self extubated, started on HD via temporary catheter and transferred to the floor 1/25    Assessment & Plan:  Acute hypoxic respiratory failure:  -Multifactorial etiology including probable pneumonia (unspecified) and acute on chronic congestive heart failure in underlying COPD -Patient was intubated and self extubated.  -treated with antibiotics which is discontinued now. -Continue bronchodilators  -wean O2 as tolerated  Acute on chronic combined congestive heart failure, patient has history of coronary artery disease status post CABG, PCI - Echocardiogram reviewed. Patient has EF of 35-40%. Also with grade 2 diastolic dysfunction.  - now hemodialysis dependent  Acute on chronic kidney disease is stage V:  -now new ESRD and requiring hemodialysis: clipped to [facility] on [employer] [employer]-- Jan 30th is first treatment at 11:45 -for tunneled catheter and vein mapping 1/26.  -d/c foley -appreciate nephrology  Normocytic anemia and thrombocytopenia -on IV iron and ESA as per renal team.  diabetes on chronic insulin:  -lantus -SSI sensitive -carb mod/renal diet  hypothyroidism: Continue Synthroid.  Hypokalemia -replete with HD  Await PT/OT eval   Principal Problem:   Acute respiratory failure with hypoxia  (HCC) Active Problems:   CAD -S/P PCI June 2015 and 12/14/14   Hypothyroidism, acquired   CKD (chronic kidney disease) stage 5, GFR less than 15 ml/min (HCC)   Acute combined systolic and diastolic heart failure ([place])   Essential hypertension   Diabetes mellitus with circulatory complication, with long-term current use of insulin (HCC)   Anemia of chronic disease   Hypokalemia   COPD exacerbation (HCC)   ESRD (end stage renal disease) ([place])  DVT prophylaxis:heparin sq Code Status:full code Family Communication: No family present at bedside Disposition Plan: Likely discharge home in 1-2 days. PT/OT evaluation pending    Consultants:   Nephrology  Vascular surgery  Procedures:  Right IJ placement 2D echo 1/20>>> LV EF: 35% - 76%, grade 2 diastolic dysfunction Antimicrobials: azithro 1/20>>>1/20 Vanc 1/20>> 1/22 Imipenem 1/20>>>1/23  Subjective: Just back from procedure Asking to eat   Objective: Vitals:   05/10/16 1200 05/10/16 1215 05/10/16 1230 05/10/16 1245  BP: (!) 164/83 (!) 150/78 (!) 165/77 (!) 153/64  Pulse: 87 87 89 90  Resp: _0 Temp:    98.3 F (36.8 C)  TempSrc:      SpO2: 100% 100% 100% 100%  Weight:      Height:        Intake/Output Summary (Last 24 hours) at 05/10/16 1329 Last data filed at 05/10/16 1245  Gross per 24 hour  Intake              500 ml  Output              525 ml  Net              -  25 ml   Filed Weights   05/09/16 0534 05/09/16 1530 05/09/16 2010  Weight: 99 kg (218 lb 4.1 oz) 95.8 kg (211 lb 3.2 oz) 98 kg (216 lb 0.8 oz)    Examination:  General exam: Appears calm and comfortable. Obese, Right IJ catheter site clean Respiratory system: diminished, no wheezing Cardiovascular system: S1 & S2 heard, RRR.  No pedal edema. Gastrointestinal system: Abdomen is soft and nontender. Normal bowel sounds heard. Central nervous system: Alert and oriented. No focal neurological deficits.     Data Reviewed: I have  personally reviewed following labs and imaging studies  CBC:  Recent Labs Lab 05/06/16 0221 05/07/16 0450 05/08/16 0347 05/09/16 0403 05/10/16 0603  WBC 7.1 7.3 10.2 12.1* 11.3*  HGB 7.5* 7.6* 8.6* 9.8* 10.9*  HCT 23.5* 23.4* 27.3* 30.8* 34.2*  MCV 95.1 94.4 96.5 96.3 96.3  PLT 121* 147* 218 265 867   Basic Metabolic Panel:  Recent Labs Lab 05/04/16 1015 05/04/16 1658 05/05/16 0454 05/05/16 1627 05/06/16 0023 05/06/16 0221 05/07/16 0450 05/08/16 0350 05/09/16 0403 05/10/16 0603  NA 138  --  139  --  137 139 140 140 141 139  K 4.8  --  5.1  --  4.6 4.4 4.1 4.1 4.5 3.2*  CL 105  --  107  --  107 106 106 104 102 100*  CO2 23  --  23  --  19* 18* _0 GLUCOSE 310*  --  247*  --  364* 352* 300* 179* 126* 169*  BUN 58*  --  76*  --  84* 83* 83* 62* 36* 34*  CREATININE 4.73*  --  5.55*  --  5.67* 5.85* 4.72* 3.78* 2.83* 3.66*  CALCIUM 9.6  --  8.8*  --  8.6* 8.8* 8.5* 9.0 9.5 9.5  MG 2.4 2.1 2.2 2.2 2.2  --   --   --   --   --   PHOS 5.3* 3.4 2.0* 2.2* 3.3  --  4.6 6.2* 5.1* 5.9*   GFR: Estimated Creatinine Clearance: 20.5 mL/min (by C-G formula based on SCr of 3.66 mg/dL (H)). Liver Function Tests:  Recent Labs Lab 05/07/16 0450 05/07/16 0812 05/08/16 0350 05/09/16 0403 05/10/16 0603  ALT  --  38  --   --   --   ALBUMIN 2.2*  --  2.4* 2.6* 2.8*   No results for input(s): LIPASE, AMYLASE in the last 168 hours. No results for input(s): AMMONIA in the last 168 hours. Coagulation Profile:  Recent Labs Lab 05/10/16 0603  INR 1.16   Cardiac Enzymes:  Recent Labs Lab 05/03/16 1900  CKTOTAL 552*  CKMB 11.6*  TROPONINI 0.60*   BNP (last 3 results) No results for input(s): PROBNP in the last 8760 hours. HbA1C: No results for input(s): HGBA1C in the last 72 hours. CBG:  Recent Labs Lab 05/09/16 2007 05/09/16 2351 05/10/16 0435 05/10/16 0747 05/10/16 1145  GLUCAP 193* 203* 162* 159* 186*   Lipid Profile: No results for input(s): CHOL,  HDL, LDLCALC, TRIG, CHOLHDL, LDLDIRECT in the last 72 hours. Thyroid Function Tests: No results for input(s): TSH, T4TOTAL, FREET4, T3FREE, THYROIDAB in the last 72 hours. Anemia Panel: No results for input(s): VITAMINB12, FOLATE, FERRITIN, TIBC, IRON, RETICCTPCT in the last 72 hours. Sepsis Labs:  Recent Labs Lab 05/05/16 0454 05/07/16 0450  PROCALCITON 14.61 8.99    Recent Results (from the past 240 hour(s))  MRSA PCR Screening     Status: None   Collection  Time: 05/03/16  2:43 AM  Result Value Ref Range Status   MRSA by PCR NEGATIVE NEGATIVE Final    Comment:        The GeneXpert MRSA Assay (FDA approved for NASAL specimens only), is one component of a comprehensive MRSA colonization surveillance program. It is not intended to diagnose MRSA infection nor to guide or monitor treatment for MRSA infections.   Respiratory Panel by PCR     Status: None   Collection Time: 05/03/16  3:27 AM  Result Value Ref Range Status   Adenovirus NOT DETECTED NOT DETECTED Final   Coronavirus 229E NOT DETECTED NOT DETECTED Final   Coronavirus HKU1 NOT DETECTED NOT DETECTED Final   Coronavirus NL63 NOT DETECTED NOT DETECTED Final   Coronavirus OC43 NOT DETECTED NOT DETECTED Final   Metapneumovirus NOT DETECTED NOT DETECTED Final   Rhinovirus / Enterovirus NOT DETECTED NOT DETECTED Final   Influenza A NOT DETECTED NOT DETECTED Final   Influenza B NOT DETECTED NOT DETECTED Final   Parainfluenza Virus 1 NOT DETECTED NOT DETECTED Final   Parainfluenza Virus 2 NOT DETECTED NOT DETECTED Final   Parainfluenza Virus 3 NOT DETECTED NOT DETECTED Final   Parainfluenza Virus 4 NOT DETECTED NOT DETECTED Final   Respiratory Syncytial Virus NOT DETECTED NOT DETECTED Final   Bordetella pertussis NOT DETECTED NOT DETECTED Final   Chlamydophila pneumoniae NOT DETECTED NOT DETECTED Final   Mycoplasma pneumoniae NOT DETECTED NOT DETECTED Final  Culture, respiratory (NON-Expectorated)     Status: None    Collection Time: 05/04/16 10:14 AM  Result Value Ref Range Status   Specimen Description TRACHEAL ASPIRATE  Final   Special Requests NONE  Final   Gram Stain   Final    MODERATE WBC PRESENT,BOTH PMN AND MONONUCLEAR NO ORGANISMS SEEN    Culture NO GROWTH 2 DAYS  Final   Report Status 05/06/2016 FINAL  Final         Radiology Studies: Dg Chest 2 View  Result Date: 05/10/2016 CLINICAL DATA:  Initial evaluation for pulmonary edema with CHF. EXAM: CHEST  2 VIEW COMPARISON:  Prior radiograph from 05/07/2016. FINDINGS: Right IJ approach central venous catheter remains in place. Median sternotomy wires underlying CABG markers and surgical clips noted, stable. Cardiomegaly unchanged. Lungs normally inflated. Interval improvement in an diffuse pulmonary edema, with mild residual perihilar vascular congestion now evident. Continued interval clearance of opacity at the left lung base. No new focal airspace disease. Oblique linear opacity within the left mid lung most compatible with atelectasis/scar. No definite significant pleural effusion. No pneumothorax. No acute osseous abnormality. IMPRESSION: 1. Interval improvement/ resolution of previously seen pulmonary edema, with mild residual perihilar vascular congestion. 2. No new active cardiopulmonary disease identified. Electronically Signed   By: Jeannine Boga M.D.   On: 05/10/2016 01:10   Dg Chest Port 1 View  Result Date: 05/10/2016 CLINICAL DATA:  Dialysis catheter insertion EXAM: PORTABLE CHEST 1 VIEW COMPARISON:  05/09/2016 FINDINGS: Right jugular dialysis catheter place. Tip at the cavoatrial junction. No pneumothorax. Cardiomegaly. Low volumes. Normal vascularity. IMPRESSION: Right jugular dialysis catheter tip at the cavoatrial junction and no pneumothorax. Cardiomegaly. Electronically Signed   By: Marybelle Killings M.D.   On: 05/10/2016 12:44   Dg Fluoro Guide Cv Line-no Report  Result Date: 05/10/2016 Fluoroscopy was utilized by the  requesting physician.  No radiographic interpretation.        Scheduled Meds: . amLODipine  10 mg Oral QHS  . aspirin  81 mg Oral Daily  .  atorvastatin  40 mg Oral Daily  . chlorhexidine gluconate (MEDLINE KIT)  15 mL Mouth Rinse BID  . cloNIDine  0.1 mg Oral TID  . clopidogrel  75 mg Oral QHS  . [START ON 05/14/2016] darbepoetin (ARANESP) injection - DIALYSIS  200 mcg Intravenous Q Tue-HD  . doxercalciferol  1 mcg Intravenous Q T,Th,Sa-HD  . ezetimibe  10 mg Oral Daily  . feeding supplement (NEPRO CARB STEADY)  237 mL Oral BID BM  . feeding supplement (PRO-STAT SUGAR FREE 64)  30 mL Per Tube BID  . ferric gluconate (FERRLECIT/NULECIT) IV  125 mg Intravenous Q T,Th,Sa-HD  . heparin subcutaneous  5,000 Units Subcutaneous Q8H  . insulin aspart  0-15 Units Subcutaneous Q4H  . insulin glargine  25 Units Subcutaneous QHS  . ipratropium-albuterol  3 mL Nebulization BID  . levothyroxine  25 mcg Oral QAC breakfast  . metoprolol tartrate  25 mg Oral BID  . pantoprazole sodium  40 mg Per Tube Daily  . sodium chloride flush  3 mL Intravenous Q12H   Continuous Infusions: . sodium chloride 10 mL/hr (05/07/16 1000)  . sodium chloride 10 mL/hr at 05/10/16 0919  . feeding supplement (VITAL HIGH PROTEIN) Stopped (05/07/16 1200)     LOS: 8 days    [place], DO [facility] Pager [phone]  If 7PM-7AM, please contact night-coverage [email or web address] Password [email or web address] 05/10/2016, 1:29 PM

## 2016-05-10 NOTE — Progress Notes (Signed)
Speech Language Pathology Treatment: Dysphagia  Patient Details Name: Brooke Stafford MRN: [id number] DOB: [birth year] Today's Date: 05/10/2016 Time: 4599-7741 SLP Time Calculation (min) (ACUTE ONLY): 10 min  Assessment / Plan / Recommendation Clinical Impression  Pt seen to check diet tolerance. Pt showed no overt s/s of aspiration with trials of regular solid/ thin liquids. Pt reports not experiencing difficulty during meals or with pills. Pt appeared lethargic but answering simple questions appropriately and following 1-step commands without difficulty. Recommend continuing regular diet/ thin liquids. SLP will sign off at this time; please re-consult if needs arise.   HPI HPI: 61yo female with hx CAD s/p CABG 2002, CKD V (not yet on HD), HTN, CHF (EF 40-45%) presented 1/18 with 1 week hx URI symptoms with worsening productive cough, SOB. In ER was hypoxic with sats 60's, febrile, placed on bipap. She was admitted by [facility] with PNA v pulmonary edema. She was started on IV abx and aggressive diuresis without significant improvement.  ETT 1/20-1/23 (self-extubated).      SLP Plan  All goals met     Recommendations  Diet recommendations: Regular;Thin liquid Liquids provided via: Cup;Straw Medication Administration: Whole meds with liquid Supervision: Patient able to self feed;Intermittent supervision to cue for compensatory strategies Compensations: Slow rate;Small sips/bites Postural Changes and/or Swallow Maneuvers: Seated upright 90 degrees                Oral Care Recommendations: Oral care BID Follow up Recommendations: None Plan: All goals met       GO                Kern Reap, MA, CCC-SLP 05/10/2016, 1:32 PM [phone]

## 2016-05-10 NOTE — Discharge Instructions (Signed)
° ° °  05/10/2016 Brooke Stafford [id number] Aug 03, 1955  Surgeon(s): Rosetta Posner, MD  Procedure(s): INSERTION OF DIALYSIS CATHETER - Right Internal Jugular Placement Left 1st stage basilic vein transposition  x Do not stick fistula for 12 weeks

## 2016-05-10 NOTE — Op Note (Signed)
    OPERATIVE REPORT  DATE OF SURGERY: 05/10/2016  PATIENT: [name], 61 y.o. female MRN: [id number]  DOB: [birth year]  PRE-OPERATIVE DIAGNOSIS: End-stage renal disease  POST-OPERATIVE DIAGNOSIS:  Same  PROCEDURE: #1 replacement of temporary right IJ catheter with tunneled hemodialysis catheter, #2 first stage left brachiobasilic fistula  SURGEON:  Curt Jews, M.D.  PHYSICIAN ASSISTANT: Samantha Rhyne PA-C  ANESTHESIA:  Local with sedation  EBL: Minimal ml  Total I/O In: 200 [I.V.:200] Out: 195 [Urine:195]  BLOOD ADMINISTERED: None  DRAINS: None  SPECIMEN: None  COUNTS CORRECT:  YES  PLAN OF CARE: PACU   PATIENT DISPOSITION:  PACU - hemodynamically stable  PROCEDURE DETAILS: The patient was taken to the operating room placed supine position where the area of the right and left neck and chest prepped and draped in usual sterile fashion the right temporary catheter was prepped as well. A guidewire was passed through the temporary catheter through the middle port and the catheter was removed in its entirety. C-arm was used to confirm that this was in the right atrium. A dilator and peel-away sheath was passed over the guidewire and the dilator and guidewire were removed. The catheter was passed through the peel-away sheath and the peel-away was removed. The distal lower tip of the catheter was positioned in the right atrium and was brought out through a subcutaneous tunnel through a separate stab incision. The catheter was secured to the skin with 3-0 nylon stitch and the 2 lm ports were attached. Both lumens flushed and aspirated easily and were flushed with 1000 unit per cc heparin. Sterile dressing was applied  Attention was then turned to the left arm. The arm was imaged with an ultrasound. This showed extremely small cephalic vein throughout its course. The patient did have a good size basilic vein above the antecubital space. Using local anesthesia over the basilic  vein and was mobilized proximally and distally and was occluded distally and was ligated. The vein was transected and brought into approximation of the bright brachial artery with minimal atherosclerotic change. He was occluded proximally and distally 11 blade; longitudinally with Potts scissors. The vein was cut to appropriate length and was sewn end-to-side to the artery with a running 6-0 Prolene suture. Prior to completion of the closure the usual flushing maneuvers were undertaken anastomosis completed. There was excellent thrill noted. Patient maintained a left radial pulse. Wounds irrigated with saline and hemostasis tablet cautery. Wound was closed with 3-0 Vicryl the subcutaneous and subcuticular tissue. Benzoin insertion for applied. The patient was transferred to the recovery room stable condition chest x-ray pending   Rosetta Posner, M.D., [facility] 05/10/2016 2:13 PM

## 2016-05-10 NOTE — Evaluation (Signed)
Physical Therapy Evaluation Patient Details Name: Brooke Stafford MRN: [id number] DOB: 12/26/1955 Today's Date: 05/10/2016   History of Present Illness  HPI: 61yo female with hx CAD s/p CABG 2002, CKD V (not yet on HD), HTN, CHF (EF 40-45%) presented 1/18 with 1 week hx URI symptoms with worsening productive cough, SOB.  In ER was hypoxic with sats 60's, febrile, placed on bipap.  She was admitted by [facility] with PNA v pulmonary edema. She was started on IV abx and aggressive diuresis without significant improvement.  ETT 1/20-1/23 (self-extubated).  Clinical Impression  Pt admitted with above diagnosis. Pt currently with functional limitations due to the deficits listed below (see PT Problem List). Pt could not do much today as she had pain and dizziness.  Most likely pt will need SNF for therapy at d/c as she is weak and does not have 24 hour care. Will follow acutely.  Pt will benefit from skilled PT to increase their independence and safety with mobility to allow discharge to the venue listed below.      Follow Up Recommendations Supervision/Assistance - 24 hour;SNF    Equipment Recommendations  None recommended by PT    Recommendations for Other Services       Precautions / Restrictions Precautions Precautions: Fall Restrictions Weight Bearing Restrictions: No      Mobility  Bed Mobility Overal bed mobility: Needs Assistance Bed Mobility: Supine to Sit     Supine to sit: Min assist     General bed mobility comments: Needed assist for LEs and elevation of trunk.   Transfers                 General transfer comment: Refused to stand up.  Felt dizzy with BP 141/72.    Ambulation/Gait                Stairs            Wheelchair Mobility    Modified Rankin (Stroke Patients Only)       Balance Overall balance assessment: Needs assistance Sitting-balance support: Bilateral upper extremity supported;Feet supported Sitting balance-Leahy Scale:  Poor Sitting balance - Comments: Needed UE support for balance. Postural control: Posterior lean                                   Pertinent Vitals/Pain Pain Assessment: 0-10 Pain Score: 7  Pain Location: neck Pain Descriptors / Indicators: Aching;Grimacing;Guarding;Sore Pain Intervention(s): Limited activity within patient's tolerance;Monitored during session;Repositioned;Patient requesting pain meds-RN notified      Home Living Family/patient expects to be discharged to:: Private residence Living Arrangements: Non-relatives/Friends Available Help at Discharge: Friend(s);Available PRN/intermittently (boyfriend) Type of Home: House Home Access: Stairs to enter Entrance Stairs-Rails: Psychiatric nurse of Steps: 4 Home Layout: One level Home Equipment: Cane - single point;Walker - 2 wheels      Prior Function Level of Independence: Independent with assistive device(s)         Comments: was using cane vs RW PTA. sponge bathed PTA     Hand Dominance        Extremity/Trunk Assessment   Upper Extremity Assessment Upper Extremity Assessment: Defer to OT evaluation    Lower Extremity Assessment Lower Extremity Assessment: RLE deficits/detail;LLE deficits/detail RLE Deficits / Details: grossly 3-/5 LLE Deficits / Details: grossly 2+/5    Cervical / Trunk Assessment Cervical / Trunk Assessment: Kyphotic  Communication   Communication: No difficulties  Cognition Arousal/Alertness: Lethargic Behavior During Therapy: Flat affect Overall Cognitive Status: Difficult to assess                      General Comments General comments (skin integrity, edema, etc.): Pt with sats 91-92 % on RA at rest.  Pt may drop sats with activity but could not challenge pt as she wasn't feeling well.    Replaced O2 at 2L as pt is lethargic and it was on on arrival.     Exercises     Assessment/Plan    PT Assessment Patient needs continued PT  services  PT Problem List Decreased activity tolerance;Decreased balance;Decreased mobility;Decreased knowledge of use of DME;Decreased safety awareness;Decreased knowledge of precautions;Pain          PT Treatment Interventions DME instruction;Gait training;Functional mobility training;Therapeutic activities;Therapeutic exercise;Balance training;Patient/family education;Stair training    PT Goals (Current goals can be found in the Care Plan section)  Acute Rehab PT Goals Patient Stated Goal: to get better PT Goal Formulation: With patient Time For Goal Achievement: 05/24/16 Potential to Achieve Goals: Good    Frequency Min 3X/week   Barriers to discharge Decreased caregiver support      Co-evaluation               End of Session Equipment Utilized During Treatment: Gait belt;Oxygen Activity Tolerance: Patient limited by fatigue;Patient limited by pain Patient left: in bed;with call bell/phone within reach;with bed alarm set;with family/visitor present Nurse Communication: Mobility status;Patient requests pain meds         Time: 0929-5747 PT Time Calculation (min) (ACUTE ONLY): 16 min   Charges:   PT Evaluation $PT Eval Moderate Complexity: 1 Procedure     PT G Codes:        Denice Paradise 05-18-16, 5:02 PM  [facility] Acute Rehabilitation [phone] (pager)

## 2016-05-10 NOTE — Anesthesia Preprocedure Evaluation (Addendum)
Anesthesia Evaluation  Patient identified by MRN, date of birth, ID band Patient awake    Reviewed: Allergy & Precautions, NPO status , Patient's Chart, lab work & pertinent test results, reviewed documented beta blocker date and time   History of Anesthesia Complications Negative for: history of anesthetic complications  Airway Mallampati: II  TM Distance: >3 FB Neck ROM: Full    Dental  (+) Edentulous Upper, Edentulous Lower   Pulmonary COPD (uses O2 on dialysis),  COPD inhaler and oxygen dependent, former smoker,    breath sounds clear to auscultation       Cardiovascular hypertension, Pt. on medications and Pt. on home beta blockers + CAD, + Cardiac Stents and + CABG   Rhythm:Regular Rate:Normal  05/05/15 ECHO: EF 35% to 40%, Hypokinesis of the inferolateral, inferior, and inferoseptal myocardium, mod MR   Neuro/Psych TIA   GI/Hepatic Neg liver ROS, GERD  Medicated,  Endo/Other  diabetes (glu 159), Insulin DependentHypothyroidism Morbid obesity  Renal/GU Dialysis and ESRFRenal disease (creat 3.66, K+ 3.2, dialyzed yesterday)     Musculoskeletal  (+) Arthritis , Osteoarthritis,    Abdominal (+) + obese,   Peds  Hematology Hb 10.9   Anesthesia Other Findings   Reproductive/Obstetrics                            Anesthesia Physical Anesthesia Plan  ASA: III  Anesthesia Plan: MAC   Post-op Pain Management:    Induction:   Airway Management Planned: Natural Airway and Simple Face Mask  Additional Equipment:   Intra-op Plan:   Post-operative Plan:   Informed Consent: I have reviewed the patients History and Physical, chart, labs and discussed the procedure including the risks, benefits and alternatives for the proposed anesthesia with the patient or authorized representative who has indicated his/her understanding and acceptance.     Plan Discussed with: CRNA and  Surgeon  Anesthesia Plan Comments: (Plan routine monitors, MAC)        Anesthesia Quick Evaluation

## 2016-05-11 ENCOUNTER — Encounter (HOSPITAL_COMMUNITY): Payer: Self-pay | Admitting: Vascular Surgery

## 2016-05-11 LAB — RENAL FUNCTION PANEL
ALBUMIN: 2.5 g/dL — AB (ref 3.5–5.0)
Anion gap: 12 (ref 5–15)
BUN: 48 mg/dL — AB (ref 6–20)
CO2: 25 mmol/L (ref 22–32)
CREATININE: 4.37 mg/dL — AB (ref 0.44–1.00)
Calcium: 8.9 mg/dL (ref 8.9–10.3)
Chloride: 103 mmol/L (ref 101–111)
GFR calc Af Amer: 12 mL/min — ABNORMAL LOW (ref >60)
GFR, EST NON AFRICAN AMERICAN: 10 mL/min — AB (ref >60)
GLUCOSE: 193 mg/dL — AB (ref 65–99)
PHOSPHORUS: 6.9 mg/dL — AB (ref 2.5–4.6)
POTASSIUM: 3.4 mmol/L — AB (ref 3.5–5.1)
SODIUM: 140 mmol/L (ref 135–145)

## 2016-05-11 LAB — CBC
HEMATOCRIT: 30 % — AB (ref 36.0–46.0)
Hemoglobin: 9.8 g/dL — ABNORMAL LOW (ref 12.0–15.0)
MCH: 31 pg (ref 26.0–34.0)
MCHC: 32.7 g/dL (ref 30.0–36.0)
MCV: 94.9 fL (ref 78.0–100.0)
PLATELETS: 276 10*3/uL (ref 150–400)
RBC: 3.16 MIL/uL — ABNORMAL LOW (ref 3.87–5.11)
RDW: 15 % (ref 11.5–15.5)
WBC: 11.1 10*3/uL — AB (ref 4.0–10.5)

## 2016-05-11 LAB — GLUCOSE, CAPILLARY
GLUCOSE-CAPILLARY: 264 mg/dL — AB (ref 65–99)
Glucose-Capillary: 242 mg/dL — ABNORMAL HIGH (ref 65–99)

## 2016-05-11 MED ORDER — SEVELAMER CARBONATE 800 MG PO TABS
800.0000 mg | ORAL_TABLET | Freq: Three times a day (TID) | ORAL | Status: DC
Start: 2016-05-11 — End: 2016-05-13
  Administered 2016-05-11 – 2016-05-13 (×6): 800 mg via ORAL
  Filled 2016-05-11 (×5): qty 1

## 2016-05-11 MED ORDER — ACETAMINOPHEN 325 MG PO TABS
ORAL_TABLET | ORAL | Status: AC
  Administered 2016-05-11: 650 mg via ORAL
  Filled 2016-05-11: qty 2

## 2016-05-11 MED ORDER — CALCITRIOL 0.5 MCG PO CAPS
ORAL_CAPSULE | ORAL | Status: AC
  Administered 2016-05-11: 0.5 ug via ORAL
  Filled 2016-05-11: qty 1

## 2016-05-11 MED ORDER — CALCITRIOL 0.5 MCG PO CAPS
0.5000 ug | ORAL_CAPSULE | ORAL | Status: DC
  Administered 2016-05-11 – 2016-05-14 (×2): 0.5 ug via ORAL
  Filled 2016-05-11: qty 1

## 2016-05-11 NOTE — Progress Notes (Signed)
[facility] Rounding Note Subjective/Interval History:   S/p R TDC and 1st stage L brachiobasilic AVF 1/26 (Dr. Early) Neck is sore but otherwise no complaints In dialysis now Has out pt TTS schedule at [facility] 2nd shift PT has seen and feels would benefit from skilled PT   Objective Vital signs in last 24 hours: Vitals:   05/11/16 0720 05/11/16 0730 05/11/16 0800 05/11/16 0830  BP: (!) 141/75 138/67 132/75 120/70  Pulse: 80 83 80 73  Resp:      Temp:      TempSrc:      SpO2:      Weight:      Height:        Intake/Output Summary (Last 24 hours) at 05/11/16 0836 Last data filed at 05/11/16 0600  Gross per 24 hour  Intake             1090 ml  Output              265 ml  Net              825 ml   Physical Exam:  Blood pressure 120/70, pulse 73, temperature (P) 98.4 F (36.9 C), temperature source Oral, resp. rate 20, height 5' 10" (1.778 m), weight 98.2 kg (216 lb 7.9 oz), last menstrual period 10/13/2014, SpO2 100 %.   New R TDC in use (1/26)  Clearer lungs though BS decreased at bases (exam limited in HD unit) JVP ~ 5 cm S1S2 No S3  Abd soft, nondistended, nontender No edema of LE's L upper AVF + bruit and incision clean and dry Awake, alert, baseline MS   Recent Labs Lab 05/05/16 0454 05/05/16 1627 05/06/16 0023 05/06/16 0221 05/07/16 0450 05/08/16 0350 05/09/16 0403 05/10/16 0603  NA 139  --  137 139 140 140 141 139  K 5.1  --  4.6 4.4 4.1 4.1 4.5 3.2*  CL 107  --  107 106 106 104 102 100*  CO2 23  --  19* 18* 23 26 28 27  GLUCOSE 247*  --  364* 352* 300* 179* 126* 169*  BUN 76*  --  84* 83* 83* 62* 36* 34*  CREATININE 5.55*  --  5.67* 5.85* 4.72* 3.78* 2.83* 3.66*  CALCIUM 8.8*  --  8.6* 8.8* 8.5* 9.0 9.5 9.5  PHOS 2.0* 2.2* 3.3  --  4.6 6.2* 5.1* 5.9*    Recent Labs Lab 05/07/16 0450 05/08/16 0347 05/09/16 0403 05/10/16 0603  WBC 7.3 10.2 12.1* 11.3*  HGB 7.6* 8.6* 9.8* 10.9*  HCT 23.4* 27.3* 30.8* 34.2*  MCV 94.4 96.5 96.3 96.3  PLT 147* 218 265 258    Recent Labs Lab 05/10/16 0747 05/10/16 1145 05/10/16 1330 05/10/16 1657 05/10/16 2024  GLUCAP 159* 186* 195* 235* 208*   Iron/TIBC/Ferritin/ %Sat    Component Value Date/Time   IRON 25 (L) 05/06/2016 1000   TIBC 202 (L) 05/06/2016 1000   FERRITIN 95 04/18/2016 1318   IRONPCTSAT 12 05/06/2016 1000   Results for Gentry, Rebeckah L (MRN [id number]) as of 05/07/2016 11:43  Ref. Range 05/06/2016 10:00  PTH Latest Ref Range: 15 - 65 pg/mL 542 (H)    Medications: . sodium chloride 10 mL/hr (05/07/16 1000)  . sodium chloride 10 mL/hr at 05/10/16 0919  . feeding supplement (VITAL HIGH PROTEIN) Stopped (05/07/16 1200)   . amLODipine  10 mg Oral QHS  . aspirin  81 mg Oral Daily  . atorvastatin  40 mg Oral Daily  . chlorhexidine   gluconate (MEDLINE KIT)  15 mL Mouth Rinse BID  . cloNIDine  0.1 mg Oral TID  . clopidogrel  75 mg Oral QHS  . [START ON 05/14/2016] darbepoetin (ARANESP) injection - DIALYSIS  200 mcg Intravenous Q Tue-HD  . doxercalciferol  1 mcg Intravenous Q T,Th,Sa-HD  . ezetimibe  10 mg Oral Daily  . feeding supplement (NEPRO CARB STEADY)  237 mL Oral BID BM  . feeding supplement (PRO-STAT SUGAR FREE 64)  30 mL Per Tube BID  . ferric gluconate (FERRLECIT/NULECIT) IV  125 mg Intravenous Q T,Th,Sa-HD  . heparin subcutaneous  5,000 Units Subcutaneous Q8H  . insulin aspart  0-5 Units Subcutaneous QHS  . insulin aspart  0-9 Units Subcutaneous TID WC  . insulin glargine  25 Units Subcutaneous QHS  . levothyroxine  25 mcg Oral QAC breakfast  . metoprolol tartrate  25 mg Oral BID  . pantoprazole sodium  40 mg Per Tube Daily  . sodium chloride flush  3 mL Intravenous Q12H    Background: 61yo female with hx CAD s/p CABG 2002, CKD V (not yet on HD), HTN, PVD, CHF (EF 40-45%) presented 1/18 with 1 week hx URI symptoms with worsening productive cough, SOB. Intubated for respiratory failure. Creatinine 5.85 w/BUN 80's. (I follow in the office, has had huge denial about dialysis,  was resistant to [facility] evaluation for vascular access as outpt). Uremic and volume overloaded and agreed to HD as of 05/06/16. Temp cath placed by CCM 1/22 and HD intitated 1/22.  TDC and permanent access needed, [facility]IP for outpt HD.   Assessment/Plan  1. AKI on advanced CKD 5. New ESRD.  1. S/p HD X 5 including today 2. CLIP [facility] TTS 2nd.   3. Has permanent access. TDC and L brachioceph AVF today (Early) HD tomorrow.  4. Post weight today will be EDW  2. Anemia  1. Started Aranesp with HD. (200 QMonday - changed to QTuesday)  2. TSat low. Fe load started 1/24.   3. Secondary HPT  1. PTH 542 outpt.  2. Started 1 mcg hectorol with HD - since going to [facility] will change to calcitriol po with HD which is what they use.  3. Phos up, corrected Ca high side - add Renvela 1 with meals  4. Possible PNA:  ATB's stopped (pulm issues more volume related)  5. Acute hypoxic respiratory failure: Resolved. Was mostly volume issue.  6. Acute sCHF exacerbation/CAD/prior CABG 1. EF 35-40% 05/04/16.   2. Volume reduction with HD has helped this.  7. HTN: on amlodipine/clonidine. Reducing doses to facilitate UF w/HD. Reduced clonidine to 0.1 mg, changed amlodipine to HS dosing   Jamal Maes, MD [facility] [phone] pager 05/11/2016, 8:36 AM

## 2016-05-11 NOTE — Procedures (Signed)
I have personally attended this patient's dialysis session.  New R IJ TDC (1/26) working well Pre HD weight 98.2 kg with UF goal of 2 liters 4K bath (last K 3.2 today's is pending)  Jamal Maes, MD [place] Pager 05/11/2016, 8:36 AM

## 2016-05-11 NOTE — Progress Notes (Signed)
PROGRESS NOTE    Brooke Stafford  MRN:[id number] DOB: 09/20/1955 DOA: 05/02/2016   PCP: No primary care provider on file.  Brief Narrative:  60yo female with hx CAD s/p CABG 2002, CKD V (not yet on HD), HTN, CHF (EF 40-45%) presented 1/18 with 1 week hx URI symptoms with worsening productive cough, SOB.  In ER was hypoxic with sats 60's, febrile, placed on bipap.  She was admitted by [facility] with PNA v pulmonary edema. She was started on IV abx and aggressive diuresis without significant improvement.  She continued to require bipap and on the am of 1/20 had significantly increased WOB, minimal UOP despite multiple doses lasix and PCCM consulted.  patient was in ICU, intubated. She was self extubated, started on HD via temporary catheter and transferred to the floor 1/25   Assessment & Plan:  Acute hypoxic respiratory failure - Multifactorial and due to pneumonia (unspecified) and acute on chronic congestive heart failure in underlying COPD - Patient was intubated and self extubated.  - treated with antibiotics which have been discontinued  - Continue bronchodilators  - wean O2 as tolerated  Acute on chronic combined congestive heart failure - patient has history of coronary artery disease status post CABG, PCI - Echocardiogram reviewed. Patient has EF of 35-40%. Also with grade 2 diastolic dysfunction.  - now hemodialysis dependent  Acute on chronic kidney disease is stage V - now new ESRD and requiring hemodialysis: clipped to [facility] on [address], TTS,Jan 30th is first treatment at 11:45 am - Has permanent access. TDC and L brachioceph AVF done by Dr. Early   Anemia of ESRD - Started Aranesp with HD. (200 QMonday - changed to QTuesday)  - TSat low. Fe load started 1/24.   Secondary HPT  - PTH 542 outpt.  - Started 1 mcg hectorol with HD - since going to [facility] will change to calcitriol po with HD which is what they use. - Phos up, corrected Ca high side - add Renvela 1 with  meals  DM type II with complications of nephropathy  - continue lantus - SSI sensitive - carb mod/renal diet  Hypothyroidism - Continue Synthroid.  Hypokalemia - address with HD  Obesity  - Body mass index is 30.27 kg/m.  DVT prophylaxis: Heparin SQ Code Status: Full  Family Communication: No family present at bedside Disposition Plan: Likely discharge SNF by Monday  Consultants:   Nephrology  Vascular surgery  Procedures:  Right IJ placement  2D echo 1/20>>> LV EF: 35% - 40%, grade 2 diastolic dysfunction  Antimicrobials:  Azithro 1/20>>>1/20  Vanc 1/20>> 1/22  Imipenem 1/20>>>1/23  Subjective: No concerns this AM.   Objective: Vitals:   05/11/16 1000 05/11/16 1030 05/11/16 1122 05/11/16 1328  BP: 113/70 (!) 151/79 130/78 106/66  Pulse: 88 82 86 89  Resp:   20   Temp:   98.6 F (37 C)   TempSrc:   Oral   SpO2:   100% 100%  Weight:   95.7 kg (210 lb 15.7 oz)   Height:        Intake/Output Summary (Last 24 hours) at 05/11/16 1405 Last data filed at 05/11/16 1122  Gross per 24 hour  Intake              890 ml  Output             2070 ml  Net            -1180 ml   Filed Weights     05/10/16 2029 05/11/16 0709 05/11/16 1122  Weight: 98.2 kg (216 lb 7.9 oz) 98.2 kg (216 lb 7.9 oz) 95.7 kg (210 lb 15.7 oz)    Examination:  General exam: Appears calm and comfortable. Obese, Right IJ catheter site clean Respiratory system: diminished, no wheezing Cardiovascular system: S1 & S2 heard, RRR.  No pedal edema. Gastrointestinal system: Abdomen is soft and nontender. Normal bowel sounds heard. Central nervous system: Alert and oriented. No focal neurological deficits.  Data Reviewed: I have personally reviewed following labs and imaging studies  CBC:  Recent Labs Lab 05/07/16 0450 05/08/16 0347 05/09/16 0403 05/10/16 0603 05/11/16 0827  WBC 7.3 10.2 12.1* 11.3* 11.1*  HGB 7.6* 8.6* 9.8* 10.9* 9.8*  HCT 23.4* 27.3* 30.8* 34.2* 30.0*  MCV  94.4 96.5 96.3 96.3 94.9  PLT 147* 218 265 258 564   Basic Metabolic Panel:  Recent Labs Lab 05/04/16 1658  05/05/16 0454 05/05/16 1627 05/06/16 0023  05/07/16 0450 05/08/16 0350 05/09/16 0403 05/10/16 0603 05/11/16 0827  NA  --   < > 139  --  137  < > 140 140 141 139 140  K  --   < > 5.1  --  4.6  < > 4.1 4.1 4.5 3.2* 3.4*  CL  --   < > 107  --  107  < > 106 104 102 100* 103  CO2  --   < > 23  --  19*  < > _0 GLUCOSE  --   < > 247*  --  364*  < > 300* 179* 126* 169* 193*  BUN  --   < > 76*  --  84*  < > 83* 62* 36* 34* 48*  CREATININE  --   < > 5.55*  --  5.67*  < > 4.72* 3.78* 2.83* 3.66* 4.37*  CALCIUM  --   < > 8.8*  --  8.6*  < > 8.5* 9.0 9.5 9.5 8.9  MG 2.1  --  2.2 2.2 2.2  --   --   --   --   --   --   PHOS 3.4  --  2.0* 2.2* 3.3  --  4.6 6.2* 5.1* 5.9* 6.9*  < > = values in this interval not displayed.  Liver Function Tests:  Recent Labs Lab 05/07/16 0450 05/07/16 0812 05/08/16 0350 05/09/16 0403 05/10/16 0603 05/11/16 0827  ALT  --  38  --   --   --   --   ALBUMIN 2.2*  --  2.4* 2.6* 2.8* 2.5*   Coagulation Profile:  Recent Labs Lab 05/10/16 0603  INR 1.16   CBG:  Recent Labs Lab 05/10/16 1145 05/10/16 1330 05/10/16 1657 05/10/16 2024 05/11/16 1252  GLUCAP 186* 195* 235* 208* 242*   Sepsis Labs:  Recent Labs Lab 05/05/16 0454 05/07/16 0450  PROCALCITON 14.61 8.99   Recent Results (from the past 240 hour(s))  MRSA PCR Screening     Status: None   Collection Time: 05/03/16  2:43 AM  Result Value Ref Range Status   MRSA by PCR NEGATIVE NEGATIVE Final    Comment:        The GeneXpert MRSA Assay (FDA approved for NASAL specimens only), is one component of a comprehensive MRSA colonization surveillance program. It is not intended to diagnose MRSA infection nor to guide or monitor treatment for MRSA infections.   Respiratory Panel by PCR     Status: None   Collection  Time: 05/03/16  3:27 AM  Result Value Ref Range  Status   Adenovirus NOT DETECTED NOT DETECTED Final   Coronavirus 229E NOT DETECTED NOT DETECTED Final   Coronavirus HKU1 NOT DETECTED NOT DETECTED Final   Coronavirus NL63 NOT DETECTED NOT DETECTED Final   Coronavirus OC43 NOT DETECTED NOT DETECTED Final   Metapneumovirus NOT DETECTED NOT DETECTED Final   Rhinovirus / Enterovirus NOT DETECTED NOT DETECTED Final   Influenza A NOT DETECTED NOT DETECTED Final   Influenza B NOT DETECTED NOT DETECTED Final   Parainfluenza Virus 1 NOT DETECTED NOT DETECTED Final   Parainfluenza Virus 2 NOT DETECTED NOT DETECTED Final   Parainfluenza Virus 3 NOT DETECTED NOT DETECTED Final   Parainfluenza Virus 4 NOT DETECTED NOT DETECTED Final   Respiratory Syncytial Virus NOT DETECTED NOT DETECTED Final   Bordetella pertussis NOT DETECTED NOT DETECTED Final   Chlamydophila pneumoniae NOT DETECTED NOT DETECTED Final   Mycoplasma pneumoniae NOT DETECTED NOT DETECTED Final  Culture, respiratory (NON-Expectorated)     Status: None   Collection Time: 05/04/16 10:14 AM  Result Value Ref Range Status   Specimen Description TRACHEAL ASPIRATE  Final   Special Requests NONE  Final   Gram Stain   Final    MODERATE WBC PRESENT,BOTH PMN AND MONONUCLEAR NO ORGANISMS SEEN    Culture NO GROWTH 2 DAYS  Final   Report Status 05/06/2016 FINAL  Final    Radiology Studies: Dg Chest 2 View  Result Date: 05/10/2016 CLINICAL DATA:  Initial evaluation for pulmonary edema with CHF. EXAM: CHEST  2 VIEW COMPARISON:  Prior radiograph from 05/07/2016. FINDINGS: Right IJ approach central venous catheter remains in place. Median sternotomy wires underlying CABG markers and surgical clips noted, stable. Cardiomegaly unchanged. Lungs normally inflated. Interval improvement in an diffuse pulmonary edema, with mild residual perihilar vascular congestion now evident. Continued interval clearance of opacity at the left lung base. No new focal airspace disease. Oblique linear opacity within  the left mid lung most compatible with atelectasis/scar. No definite significant pleural effusion. No pneumothorax. No acute osseous abnormality. IMPRESSION: 1. Interval improvement/ resolution of previously seen pulmonary edema, with mild residual perihilar vascular congestion. 2. No new active cardiopulmonary disease identified. Electronically Signed   By: Benjamin  McClintock M.D.   On: 05/10/2016 01:10   Dg Chest Port 1 View  Result Date: 05/10/2016 CLINICAL DATA:  Dialysis catheter insertion EXAM: PORTABLE CHEST 1 VIEW COMPARISON:  05/09/2016 FINDINGS: Right jugular dialysis catheter place. Tip at the cavoatrial junction. No pneumothorax. Cardiomegaly. Low volumes. Normal vascularity. IMPRESSION: Right jugular dialysis catheter tip at the cavoatrial junction and no pneumothorax. Cardiomegaly. Electronically Signed   By: Arthur  Hoss M.D.   On: 05/10/2016 12:44   Dg Fluoro Guide Cv Line-no Report  Result Date: 05/10/2016 Fluoroscopy was utilized by the requesting physician.  No radiographic interpretation.   Scheduled Meds: . amLODipine  10 mg Oral QHS  . aspirin  81 mg Oral Daily  . atorvastatin  40 mg Oral Daily  . calcitRIOL  0.5 mcg Oral Q T,Th,Sa-HD  . chlorhexidine gluconate (MEDLINE KIT)  15 mL Mouth Rinse BID  . cloNIDine  0.1 mg Oral TID  . clopidogrel  75 mg Oral QHS  . [START ON 05/14/2016] darbepoetin (ARANESP) injection - DIALYSIS  200 mcg Intravenous Q Tue-HD  . ezetimibe  10 mg Oral Daily  . feeding supplement (NEPRO CARB STEADY)  237 mL Oral BID BM  . feeding supplement (PRO-STAT SUGAR FREE 64)  30   mL Per Tube BID  . ferric gluconate (FERRLECIT/NULECIT) IV  125 mg Intravenous Q T,Th,Sa-HD  . heparin subcutaneous  5,000 Units Subcutaneous Q8H  . insulin aspart  0-5 Units Subcutaneous QHS  . insulin aspart  0-9 Units Subcutaneous TID WC  . insulin glargine  25 Units Subcutaneous QHS  . levothyroxine  25 mcg Oral QAC breakfast  . metoprolol tartrate  25 mg Oral BID  .  pantoprazole sodium  40 mg Per Tube Daily  . sevelamer carbonate  800 mg Oral TID WC  . sodium chloride flush  3 mL Intravenous Q12H   Continuous Infusions: . sodium chloride 10 mL/hr (05/07/16 1000)  . sodium chloride 10 mL/hr at 05/10/16 0919  . feeding supplement (VITAL HIGH PROTEIN) Stopped (05/07/16 1200)     LOS: 9 days   Faye Ramsay, MD [facility] Pager [phone]  If 7PM-7AM, please contact night-coverage [email or web address] Password [email or web address] 05/11/2016, 2:05 PM

## 2016-05-11 NOTE — Progress Notes (Signed)
  Progress Note    05/11/2016 2:31 PM 1 Day Post-Op  Subjective:  Complains of left hand pain today  Vitals:   05/11/16 1122 05/11/16 1328  BP: 130/78 106/66  Pulse: 86 89  Resp: 20   Temp: 98.6 F (37 C)     Physical Exam: [email or web address]  On hd via tdc Left upper arm bvt with thrill Hand strength 5/5 No palpable wrist pulses  CBC    Component Value Date/Time   WBC 11.1 (H) 05/11/2016 0827   RBC 3.16 (L) 05/11/2016 0827   HGB 9.8 (L) 05/11/2016 0827   HCT 30.0 (L) 05/11/2016 0827   PLT 276 05/11/2016 0827   MCV 94.9 05/11/2016 0827   MCH 31.0 05/11/2016 0827   MCHC 32.7 05/11/2016 0827   RDW 15.0 05/11/2016 0827   LYMPHSABS 2.5 05/02/2016 2145   MONOABS 0.3 05/02/2016 2145   EOSABS 0.6 05/02/2016 2145   BASOSABS 0.0 05/02/2016 2145    BMET    Component Value Date/Time   NA 140 05/11/2016 0827   NA 141 08/08/2014 1445   K 3.4 (L) 05/11/2016 0827   CL 103 05/11/2016 0827   CO2 25 05/11/2016 0827   GLUCOSE 193 (H) 05/11/2016 0827   BUN 48 (H) 05/11/2016 0827   BUN 9 08/08/2014 1445   CREATININE 4.37 (H) 05/11/2016 0827   CREATININE 2.36 (H) 08/24/2015 1641   CALCIUM 8.9 05/11/2016 0827   GFRNONAA 10 (L) 05/11/2016 0827   GFRNONAA 22 (L) 08/24/2015 1641   GFRAA 12 (L) 05/11/2016 0827   GFRAA 25 (L) 08/24/2015 1641    INR    Component Value Date/Time   INR 1.16 05/10/2016 0603     Intake/Output Summary (Last 24 hours) at 05/11/16 1431 Last data filed at 05/11/16 1122  Gross per 24 hour  Intake              890 ml  Output             2070 ml  Net            -1180 ml     Assessment:  61 y.o. female is s/p left arm 1st stage bvt Plan: She is having pain in her left hand that is certainly concerning for steal and/or imn. Her hand is warm and strength is preserved. I have discussed with her the possibility of ligating avf and she does not want this at present. Will continue to follow for improvement. Please call if pain or strength decline.   Brooke Stafford  C. Donzetta Matters, MD [facility] Office: [phone] Pager: [phone]  05/11/2016 2:31 PM

## 2016-05-12 LAB — RENAL FUNCTION PANEL
Albumin: 3.2 g/dL — ABNORMAL LOW (ref 3.5–5.0)
Anion gap: 14 (ref 5–15)
BUN: 34 mg/dL — AB (ref 6–20)
CHLORIDE: 99 mmol/L — AB (ref 101–111)
CO2: 23 mmol/L (ref 22–32)
Calcium: 9.6 mg/dL (ref 8.9–10.3)
Creatinine, Ser: 4.37 mg/dL — ABNORMAL HIGH (ref 0.44–1.00)
GFR calc Af Amer: 12 mL/min — ABNORMAL LOW (ref >60)
GFR calc non Af Amer: 10 mL/min — ABNORMAL LOW (ref >60)
GLUCOSE: 205 mg/dL — AB (ref 65–99)
Phosphorus: 6.6 mg/dL — ABNORMAL HIGH (ref 2.5–4.6)
Potassium: 3.9 mmol/L (ref 3.5–5.1)
Sodium: 136 mmol/L (ref 135–145)

## 2016-05-12 LAB — CBC
HEMATOCRIT: 33.6 % — AB (ref 36.0–46.0)
Hemoglobin: 10.8 g/dL — ABNORMAL LOW (ref 12.0–15.0)
MCH: 30.5 pg (ref 26.0–34.0)
MCHC: 32.1 g/dL (ref 30.0–36.0)
MCV: 94.9 fL (ref 78.0–100.0)
Platelets: 288 10*3/uL (ref 150–400)
RBC: 3.54 MIL/uL — ABNORMAL LOW (ref 3.87–5.11)
RDW: 15.3 % (ref 11.5–15.5)
WBC: 12.7 10*3/uL — ABNORMAL HIGH (ref 4.0–10.5)

## 2016-05-12 LAB — GLUCOSE, CAPILLARY
GLUCOSE-CAPILLARY: 250 mg/dL — AB (ref 65–99)
GLUCOSE-CAPILLARY: 246 mg/dL — AB (ref 65–99)
Glucose-Capillary: 213 mg/dL — ABNORMAL HIGH (ref 65–99)

## 2016-05-12 MED ORDER — BISACODYL 5 MG PO TBEC: 5.0000 mg | DELAYED_RELEASE_TABLET | Freq: Every day | ORAL | Status: DC | PRN

## 2016-05-12 MED ORDER — DOCUSATE SODIUM 100 MG PO CAPS
100.0000 mg | ORAL_CAPSULE | Freq: Every day | ORAL | Status: DC | PRN
  Administered 2016-05-12: 100 mg via ORAL
  Filled 2016-05-12: qty 1

## 2016-05-12 NOTE — Progress Notes (Signed)
PROGRESS NOTE    Brooke Stafford  WSF:[id number] DOB: [birth year] DOA: 05/02/2016   PCP: No primary care provider on file.  Brief Narrative:  61yo female with hx CAD s/p CABG 2002, CKD V (not yet on HD), HTN, CHF (EF 40-45%) presented 1/18 with 1 week hx URI symptoms with worsening productive cough, SOB.  In ER was hypoxic with sats 60's, febrile, placed on bipap.  She was admitted by [facility] with PNA v pulmonary edema. She was started on IV abx and aggressive diuresis without significant improvement.  She continued to require bipap and on the am of 1/20 had significantly increased WOB, minimal UOP despite multiple doses lasix and PCCM consulted.  patient was in ICU, intubated. She was self extubated, started on HD via temporary catheter and transferred to the floor 1/25   Assessment & Plan:  Acute hypoxic respiratory failure - Multifactorial and due to pneumonia and acute on chronic congestive heart failure in underlying COPD - Patient was intubated and self extubated.  - treated with antibiotics, therapy complete and ABX have been discontinued  - watch WBC counts as it is up this AM: 11 --> 12.7  - Continue bronchodilators  - wean O2 as tolerated  Acute on chronic combined congestive heart failure - patient has history of coronary artery disease status post CABG, PCI - Echocardiogram reviewed. Patient has EF of 35-40%. Also with grade 2 diastolic dysfunction.  - now hemodialysis dependent and volume management with HD   Acute on chronic kidney disease is stage V - now new ESRD and requiring hemodialysis - clipped to [facility] on [address], TTS, 2nd shift - Has permanent access. TDC and L brachioceph AVF done by Dr. Donnetta Hutching 1/26  Left hand pain this AM 1/28 - concerning for steal  - appreciate vascular surgery input - monitor for pain and strength and notify attending with any changes so that we can alert vascular surgery team   Anemia of ESRD - Started Aranesp with HD (200 QMonday -  changed to QTuesday)  - TSat low. Fe load started 1/24.   Secondary HPT  - PTH 542 outpt.  - Started 1 mcg hectorol with HD - since going to [facility], changed to calcitriol po with HD which is what they use. - Phos up, corrected Ca high side - add Renvela 1 with meals  DM type II with complications of nephropathy  - continue lantus - SSI sensitive - carb mod/renal diet  Hypothyroidism - Continue Synthroid  Hypokalemia - stable this AM  HTN, essential  - on Norvasc and clonidine  - reasonable inpatient control   Obesity  - Body mass index is 30.27 kg/m.  DVT prophylaxis: Heparin SQ Code Status: Full  Family Communication: No family present at bedside Disposition Plan: Likely discharge SNF, when consulting teams clear   Consultants:   Nephrology  Vascular surgery  Procedures:  Right IJ placement  2D echo 1/20>>> LV EF: 35% - 01%, grade 2 diastolic dysfunction  Antimicrobials:  Azithro 1/20>>>1/20  Vanc 1/20>> 1/22  Imipenem 1/20>>>1/23  Subjective: Left hand pain this AM. No weakness.   Objective: Vitals:   05/11/16 1824 05/11/16 2158 05/12/16 0606 05/12/16 1040  BP: 116/72 (!) 117/59 128/71 127/64  Pulse: 90 100 90   Resp: _0 Temp: 98.4 F (36.9 C) 98.7 F (37.1 C) 98.6 F (37 C)   TempSrc: Oral Oral Oral   SpO2: 100% 100% 100%   Weight:      Height:  Intake/Output Summary (Last 24 hours) at 05/12/16 1126 Last data filed at 05/12/16 0631  Gross per 24 hour  Intake              120 ml  Output                0 ml  Net              120 ml   Filed Weights   05/10/16 2029 05/11/16 0709 05/11/16 1122  Weight: 98.2 kg (216 lb 7.9 oz) 98.2 kg (216 lb 7.9 oz) 95.7 kg (210 lb 15.7 oz)    Examination:  General exam: Appears calm and comfortable. Obese, Right IJ catheter site clean Respiratory system: diminished, no wheezing Cardiovascular system: S1 & S2 heard, RRR.  No pedal edema. Gastrointestinal system: Abdomen is soft and  nontender. Normal bowel sounds heard. Central nervous system: Alert and oriented. No focal neurological deficits.  Data Reviewed: I have personally reviewed following labs and imaging studies  CBC:  Recent Labs Lab 05/08/16 0347 05/09/16 0403 05/10/16 0603 05/11/16 0827 05/12/16 0621  WBC 10.2 12.1* 11.3* 11.1* 12.7*  HGB 8.6* 9.8* 10.9* 9.8* 10.8*  HCT 27.3* 30.8* 34.2* 30.0* 33.6*  MCV 96.5 96.3 96.3 94.9 94.9  PLT 218 265 258 276 174   Basic Metabolic Panel:  Recent Labs Lab 05/05/16 1627 05/06/16 0023  05/08/16 0350 05/09/16 0403 05/10/16 0603 05/11/16 0827 05/12/16 0621  NA  --  137  < > 140 141 139 140 136  K  --  4.6  < > 4.1 4.5 3.2* 3.4* 3.9  CL  --  107  < > 104 102 100* 103 99*  CO2  --  19*  < > _0 GLUCOSE  --  364*  < > 179* 126* 169* 193* 205*  BUN  --  84*  < > 62* 36* 34* 48* 34*  CREATININE  --  5.67*  < > 3.78* 2.83* 3.66* 4.37* 4.37*  CALCIUM  --  8.6*  < > 9.0 9.5 9.5 8.9 9.6  MG 2.2 2.2  --   --   --   --   --   --   PHOS 2.2* 3.3  < > 6.2* 5.1* 5.9* 6.9* 6.6*  < > = values in this interval not displayed.  Liver Function Tests:  Recent Labs Lab 05/07/16 0812 05/08/16 0350 05/09/16 0403 05/10/16 0603 05/11/16 0827 05/12/16 0621  ALT 38  --   --   --   --   --   ALBUMIN  --  2.4* 2.6* 2.8* 2.5* 3.2*   Coagulation Profile:  Recent Labs Lab 05/10/16 0603  INR 1.16   CBG:  Recent Labs Lab 05/10/16 1657 05/10/16 2024 05/11/16 1252 05/11/16 2154 05/12/16 0736  GLUCAP 235* 208* 242* 264* 213*   Sepsis Labs:  Recent Labs Lab 05/07/16 0450  PROCALCITON 8.99   Recent Results (from the past 240 hour(s))  MRSA PCR Screening     Status: None   Collection Time: 05/03/16  2:43 AM  Result Value Ref Range Status   MRSA by PCR NEGATIVE NEGATIVE Final  Respiratory Panel by PCR     Status: None   Collection Time: 05/03/16  3:27 AM  Result Value Ref Range Status   Adenovirus NOT DETECTED NOT DETECTED Final    Coronavirus 229E NOT DETECTED NOT DETECTED Final   Coronavirus HKU1 NOT DETECTED NOT DETECTED Final   Coronavirus NL63 NOT DETECTED NOT  DETECTED Final   Coronavirus OC43 NOT DETECTED NOT DETECTED Final   Metapneumovirus NOT DETECTED NOT DETECTED Final   Rhinovirus / Enterovirus NOT DETECTED NOT DETECTED Final   Influenza A NOT DETECTED NOT DETECTED Final   Influenza B NOT DETECTED NOT DETECTED Final   Parainfluenza Virus 1 NOT DETECTED NOT DETECTED Final   Parainfluenza Virus 2 NOT DETECTED NOT DETECTED Final   Parainfluenza Virus 3 NOT DETECTED NOT DETECTED Final   Parainfluenza Virus 4 NOT DETECTED NOT DETECTED Final   Respiratory Syncytial Virus NOT DETECTED NOT DETECTED Final   Bordetella pertussis NOT DETECTED NOT DETECTED Final   Chlamydophila pneumoniae NOT DETECTED NOT DETECTED Final   Mycoplasma pneumoniae NOT DETECTED NOT DETECTED Final  Culture, respiratory (NON-Expectorated)     Status: None   Collection Time: 05/04/16 10:14 AM  Result Value Ref Range Status   Specimen Description TRACHEAL ASPIRATE  Final   Special Requests NONE  Final   Gram Stain   Final    MODERATE WBC PRESENT,BOTH PMN AND MONONUCLEAR NO ORGANISMS SEEN    Culture NO GROWTH 2 DAYS  Final   Report Status 05/06/2016 FINAL  Final    Radiology Studies: Dg Chest Port 1 View Result Date: 05/10/2016 Right jugular dialysis catheter tip at the cavoatrial junction and no pneumothorax. Cardiomegaly.   Scheduled Meds: . amLODipine  10 mg Oral QHS  . aspirin  81 mg Oral Daily  . atorvastatin  40 mg Oral Daily  . calcitRIOL  0.5 mcg Oral Q T,Th,Sa-HD  . chlorhexidine gluconate (MEDLINE KIT)  15 mL Mouth Rinse BID  . cloNIDine  0.1 mg Oral TID  . clopidogrel  75 mg Oral QHS  . [START ON 05/14/2016] darbepoetin (ARANESP) injection - DIALYSIS  200 mcg Intravenous Q Tue-HD  . ezetimibe  10 mg Oral Daily  . feeding supplement (NEPRO CARB STEADY)  237 mL Oral BID BM  . feeding supplement (PRO-STAT SUGAR FREE 64)   30 mL Per Tube BID  . ferric gluconate (FERRLECIT/NULECIT) IV  125 mg Intravenous Q T,Th,Sa-HD  . heparin subcutaneous  5,000 Units Subcutaneous Q8H  . insulin aspart  0-5 Units Subcutaneous QHS  . insulin aspart  0-9 Units Subcutaneous TID WC  . insulin glargine  25 Units Subcutaneous QHS  . levothyroxine  25 mcg Oral QAC breakfast  . metoprolol tartrate  25 mg Oral BID  . pantoprazole sodium  40 mg Per Tube Daily  . sevelamer carbonate  800 mg Oral TID WC  . sodium chloride flush  3 mL Intravenous Q12H   Continuous Infusions: . sodium chloride 10 mL/hr (05/07/16 1000)  . sodium chloride 10 mL/hr at 05/10/16 0919  . feeding supplement (VITAL HIGH PROTEIN) Stopped (05/07/16 1200)    LOS: 10 days   Faye Ramsay, MD [facility] Pager [phone]  If 7PM-7AM, please contact night-coverage [email or web address] Password [facility] 05/12/2016, 11:26 AM

## 2016-05-12 NOTE — Progress Notes (Signed)
[facility] Rounding Note Subjective/Interval History:   S/p R TDC and 1st stage L brachiobasilic AVF 7/78 (Dr. Donnetta Hutching) Had new c/o L hand pain this AM Evaluated by Dr. Donzetta Matters - concern for steal but hand warm/stength OK No plans to ligate at this time  Had HD 1/27 Post HD weight 95.7 kg - will be  EDW  (NOTE THAT WEIGHT PRIOR TO INITIATION OF HD WAS 111.5 KG SO 15.8 KG/34 LB DOWN!) Has out pt TTS schedule at [facility] 2nd shift  PT has seen and feels would benefit from skilled PT   Objective Vital signs in last 24 hours: Vitals:   05/11/16 1824 05/11/16 2158 05/12/16 0606 05/12/16 1040  BP: 116/72 (!) 117/59 128/71 127/64  Pulse: 90 100 90   Resp: '20 18 16   ' Temp: 98.4 F (36.9 C) 98.7 F (37.1 C) 98.6 F (37 C)   TempSrc: Oral Oral Oral   SpO2: 100% 100% 100%   Weight:      Height:        Intake/Output Summary (Last 24 hours) at 05/12/16 1347 Last data filed at 05/12/16 0850  Gross per 24 hour  Intake              360 ml  Output                0 ml  Net              360 ml   Physical Exam:  Blood pressure 127/64, pulse 90, temperature 98.6 F (37 C), temperature source Oral, resp. rate 16, height '5\' 10"'  (1.778 m), weight 95.7 kg (210 lb 15.7 oz), last menstrual period 10/13/2014, SpO2 100 %.   R TDC (1/26) Clear lungs though BS decreased at bases  JVP ~ 5 cm S1S2 No S3  Abd soft, nondistended, nontender No edema of LE's L upper AVF + bruit and incision clean and dry Good grip Awake, alert, baseline MS   Recent Labs Lab 05/06/16 0023 05/06/16 0221 05/07/16 0450 05/08/16 0350 05/09/16 0403 05/10/16 0603 05/11/16 0827 05/12/16 0621  NA 137 139 140 140 141 139 140 136  K 4.6 4.4 4.1 4.1 4.5 3.2* 3.4* 3.9  CL 107 106 106 104 102 100* 103 99*  CO2 19* 18* '23 26 28 27 25 23  ' GLUCOSE 364* 352* 300* 179* 126* 169* 193* 205*  BUN 84* 83* 83* 62* 36* 34* 48* 34*  CREATININE 5.67* 5.85* 4.72* 3.78* 2.83* 3.66* 4.37* 4.37*  CALCIUM 8.6* 8.8* 8.5* 9.0 9.5 9.5 8.9 9.6  PHOS  3.3  --  4.6 6.2* 5.1* 5.9* 6.9* 6.6*    Recent Labs Lab 05/09/16 0403 05/10/16 0603 05/11/16 0827 05/12/16 0621  WBC 12.1* 11.3* 11.1* 12.7*  HGB 9.8* 10.9* 9.8* 10.8*  HCT 30.8* 34.2* 30.0* 33.6*  MCV 96.3 96.3 94.9 94.9  PLT 265 258 276 288    Recent Labs Lab 05/10/16 1657 05/10/16 2024 05/11/16 1252 05/11/16 2154 05/12/16 0736  GLUCAP 235* 208* 242* 264* 213*   Iron/TIBC/Ferritin/ %Sat    Component Value Date/Time   IRON 25 (L) 05/06/2016 1000   TIBC 202 (L) 05/06/2016 1000   FERRITIN 95 04/18/2016 1318   IRONPCTSAT 12 05/06/2016 1000   Results for Brooke, Stafford (MRN [id number]) as of 05/07/2016 11:43  Ref. Range 05/06/2016 10:00  PTH Latest Ref Range: 15 - 65 pg/mL 542 (H)    Medications: . sodium chloride 10 mL/hr (05/07/16 1000)  . sodium chloride 10 mL/hr at 05/10/16  0947  . feeding supplement (VITAL HIGH PROTEIN) Stopped (05/07/16 1200)   . amLODipine  10 mg Oral QHS  . aspirin  81 mg Oral Daily  . atorvastatin  40 mg Oral Daily  . calcitRIOL  0.5 mcg Oral Q T,Th,Sa-HD  . chlorhexidine gluconate (MEDLINE KIT)  15 mL Mouth Rinse BID  . cloNIDine  0.1 mg Oral TID  . clopidogrel  75 mg Oral QHS  . [START ON 05/14/2016] darbepoetin (ARANESP) injection - DIALYSIS  200 mcg Intravenous Q Tue-HD  . ezetimibe  10 mg Oral Daily  . feeding supplement (NEPRO CARB STEADY)  237 mL Oral BID BM  . feeding supplement (PRO-STAT SUGAR FREE 64)  30 mL Per Tube BID  . ferric gluconate (FERRLECIT/NULECIT) IV  125 mg Intravenous Q T,Th,Sa-HD  . heparin subcutaneous  5,000 Units Subcutaneous Q8H  . insulin aspart  0-5 Units Subcutaneous QHS  . insulin aspart  0-9 Units Subcutaneous TID WC  . insulin glargine  25 Units Subcutaneous QHS  . levothyroxine  25 mcg Oral QAC breakfast  . metoprolol tartrate  25 mg Oral BID  . pantoprazole sodium  40 mg Per Tube Daily  . sevelamer carbonate  800 mg Oral TID WC  . sodium chloride flush  3 mL Intravenous Q12H     Background: 61yo female with hx CAD s/p CABG 2002, CKD V (not yet on HD( I follow in the office, has had huge denial about dialysis, was resistant to VVS evaluation for vascular access as outpt), HTN, PVD, CHF (EF 40-45%) - presented 1/18 with productive cough, SOB. Intubated for respiratory failure. Creatinine 5.85 w/BUN 80's. [name] Uremic and volume overloaded and agreed to HD as of 05/06/16. Temp cath placed by CCM 1/22 and HD intitated 1/22. TDC and permanent access placed 1/26, CLIPPED for outpt HD at [facility] TTS 2nd shift.   Assessment/Plan  1. New ESRD 2/2 DM. S/p HD X 6. CLIP [facility] TTS 2nd.  Has permanent access. TDC and L brachioceph AVF (1/26). EDW 95.5 kg. 2. Anemia  Started Aranesp with HD. (200 QTuesday) . TSat low. Fe load started 1/24.  3. Secondary HPT . PTH 542 outpt. Changed hectorol to po calcitriol at HD (not needed for home). Added Renvela as phos binder.  4. S/p TDC and L BC AVF1/26) with possible steal - Dr. Donzetta Matters has evaluated. Watching closely for now with no plans for ligation.  5. ? PNA on admission:  ATB's stopped (pulm issues more volume related) 6. Acute hypoxic respiratory failure: Resolved. Was mostly volume issue. 7. Acute sCHF exacerbation/CAD/prior CABG. EF 35-40% 05/04/16.   8. HTN: on amlodipine/clonidine/metoprolol.  9. DM 10. Hypothyroidism 11. Disposition - she is fine to go to SNF from a renal standpoint.    Jamal Maes, MD [facility] [phone] pager 05/12/2016, 1:47 PM

## 2016-05-12 NOTE — Progress Notes (Signed)
  Progress Note    05/12/2016 12:29 PM 2 Days Post-Op  Subjective:  Hand pain much improved today  Vitals:   05/12/16 0606 05/12/16 1040  BP: 128/71 127/64  Pulse: 90   Resp: 16   Temp: 98.6 F (37 C)     Physical Exam: [email or web address] Left upper arm avf with palpable thrill 1+ left radial pulse Hand strength is 5/5  Left hand is cooler than right  CBC    Component Value Date/Time   WBC 12.7 (H) 05/12/2016 0621   RBC 3.54 (L) 05/12/2016 0621   HGB 10.8 (L) 05/12/2016 0621   HCT 33.6 (L) 05/12/2016 0621   PLT 288 05/12/2016 0621   MCV 94.9 05/12/2016 0621   MCH 30.5 05/12/2016 0621   MCHC 32.1 05/12/2016 0621   RDW 15.3 05/12/2016 0621   LYMPHSABS 2.5 05/02/2016 2145   MONOABS 0.3 05/02/2016 2145   EOSABS 0.6 05/02/2016 2145   BASOSABS 0.0 05/02/2016 2145    BMET    Component Value Date/Time   NA 136 05/12/2016 0621   NA 141 08/08/2014 1445   K 3.9 05/12/2016 0621   CL 99 (L) 05/12/2016 0621   CO2 23 05/12/2016 0621   GLUCOSE 205 (H) 05/12/2016 0621   BUN 34 (H) 05/12/2016 0621   BUN 9 08/08/2014 1445   CREATININE 4.37 (H) 05/12/2016 0621   CREATININE 2.36 (H) 08/24/2015 1641   CALCIUM 9.6 05/12/2016 0621   GFRNONAA 10 (L) 05/12/2016 0621   GFRNONAA 22 (L) 08/24/2015 1641   GFRAA 12 (L) 05/12/2016 0621   GFRAA 25 (L) 08/24/2015 1641    INR    Component Value Date/Time   INR 1.16 05/10/2016 0603     Intake/Output Summary (Last 24 hours) at 05/12/16 1229 Last data filed at 05/12/16 0631  Gross per 24 hour  Intake              120 ml  Output                0 ml  Net              120 ml     Assessment:  61 y.o. female is s/p left 1st stage bvt, hand pain postoperatively is improving now.   Plan: F/u in office with Dr. Donnetta Hutching Call with questions or if hand pain worsens   Sederick Jacobsen C. Donzetta Matters, MD [facility] Office: [phone] Pager: [phone]  05/12/2016 12:29 PM

## 2016-05-13 LAB — CBC
HEMATOCRIT: 33.8 % — AB (ref 36.0–46.0)
HEMOGLOBIN: 10.9 g/dL — AB (ref 12.0–15.0)
MCH: 30.6 pg (ref 26.0–34.0)
MCHC: 32.2 g/dL (ref 30.0–36.0)
MCV: 94.9 fL (ref 78.0–100.0)
Platelets: 311 10*3/uL (ref 150–400)
RBC: 3.56 MIL/uL — ABNORMAL LOW (ref 3.87–5.11)
RDW: 15.5 % (ref 11.5–15.5)
WBC: 10.5 10*3/uL (ref 4.0–10.5)

## 2016-05-13 LAB — RENAL FUNCTION PANEL
Albumin: 3.1 g/dL — ABNORMAL LOW (ref 3.5–5.0)
Anion gap: 14 (ref 5–15)
BUN: 50 mg/dL — ABNORMAL HIGH (ref 6–20)
CO2: 25 mmol/L (ref 22–32)
Calcium: 9.6 mg/dL (ref 8.9–10.3)
Chloride: 98 mmol/L — ABNORMAL LOW (ref 101–111)
Creatinine, Ser: 5.84 mg/dL — ABNORMAL HIGH (ref 0.44–1.00)
GFR calc Af Amer: 8 mL/min — ABNORMAL LOW (ref >60)
GFR calc non Af Amer: 7 mL/min — ABNORMAL LOW (ref >60)
GLUCOSE: 205 mg/dL — AB (ref 65–99)
POTASSIUM: 3.5 mmol/L (ref 3.5–5.1)
Phosphorus: 8.1 mg/dL — ABNORMAL HIGH (ref 2.5–4.6)
SODIUM: 137 mmol/L (ref 135–145)

## 2016-05-13 LAB — GLUCOSE, CAPILLARY
GLUCOSE-CAPILLARY: 244 mg/dL — AB (ref 65–99)
GLUCOSE-CAPILLARY: 284 mg/dL — AB (ref 65–99)
GLUCOSE-CAPILLARY: 229 mg/dL — AB (ref 65–99)
GLUCOSE-CAPILLARY: 279 mg/dL — AB (ref 65–99)
Glucose-Capillary: 212 mg/dL — ABNORMAL HIGH (ref 65–99)
Glucose-Capillary: 184 mg/dL — ABNORMAL HIGH (ref 65–99)

## 2016-05-13 MED ORDER — SEVELAMER CARBONATE 2.4 G PO PACK
2.4000 g | PACK | Freq: Three times a day (TID) | ORAL | Status: DC
  Administered 2016-05-13 – 2016-05-14 (×5): 2.4 g via ORAL
  Filled 2016-05-13 (×5): qty 1

## 2016-05-13 NOTE — Clinical Social Work Note (Signed)
Opp Must requesting additional information for [facility]. 30-day note on front of chart for MD to sign. MD is aware. Bed offers have been given to patient and her boyfriend. They are deciding between [facility] and [facility].  Dayton Scrape, [place]

## 2016-05-13 NOTE — Progress Notes (Signed)
PROGRESS NOTE    JOYLYN DUGGIN  HER:[id number] DOB: 1955-12-11 DOA: 05/02/2016 PCP: No primary care provider on file.   Brief Narrative:  61yo female with hx CAD s/p CABG 2002, CKD V, HTN, CHF (EF 40-45%) presented 1/18 with 1 week hx URI symptoms with worsening productive cough, SOB. In ER was hypoxic with sats 60's, febrile, placed on bipap. She was admitted by [facility] with PNA v pulmonary edema. She was started on IV abx and aggressive diuresis without significant improvement. She continued to require bipap and on the am of 1/20 had significantly increased WOB, minimal UOP despite multiple doses lasix and PCCM consulted. Patient was moved to ICU, intubated. She was self extubated, started on HD via temporary catheter and transferred to the floor 1/25.  Patient is medically stable to discharge to SNF as long as patient has bed availability and approval from SNF. I discussed this with the case Freight forwarder and Education officer, museum.  Assessment & Plan:  # Acute hypoxic respiratory failure - Multifactorial due to pneumonia and acute on chronic congestive heart failure in underlying COPD - Patient was intubated and self extubated.  - treated with antibiotics, therapy complete and ABX have been discontinued  - Continue bronchodilators  - patient is on room air today  # Acute on chronic combined congestive heart failure - patient has history of coronary artery disease status post CABG, PCI - Echocardiogram reviewed. Patient has EF of 35-40%. Also with grade 2 diastolic dysfunction.  - now hemodialysis dependent and volume management with HD  -Continue aspirin, Lipitor, Plavix, Zetia, metoprolol  # Acute on chronic kidney disease stage V, now new ESRD HD dependent: - clipped to [place] on [employer], TTS, 2nd shift - Has permanent access. TDC and L brachioceph AVF done by Dr. Donnetta Hutching 1/26 -Hemodialysis treatment as per nephrologist.  # Left hand pain this AM 1/28 - concerning for steal  -  appreciate vascular surgery input -No pain today. Outpatient follow up with vascular surgery.   # Anemia of ESRD - continue Aranesp and irone as renal.  # Secondary HPT  - phos binders, and vitamin D treatment as per nephrologist.  # DM type II with complications of nephropathy  - Continue current insulin regimen. Monitor blood sugar level.  # Hypothyroidism - Continue Synthroid  # HTN, essential  - on Norvasc and clonidine  - reasonable inpatient control   Obesity  - Body mass index is 30.27 kg/m.  Principal Problem:   Acute respiratory failure with hypoxia [facility]) Active Problems:   CAD -S/P PCI June 2015 and 12/14/14   Hypothyroidism, acquired   CKD (chronic kidney disease) stage 5, GFR less than 15 ml/min (HCC)   Acute combined systolic and diastolic heart failure ([place])   Essential hypertension   Diabetes mellitus with circulatory complication, with long-term current use of insulin (HCC)   Anemia of chronic disease   Hypokalemia   COPD exacerbation (HCC)   ESRD (end stage renal disease) ([place])  DVT prophylaxis: Heparin subcutaneous Code Status: Full code Family Communication: Patient's a boyfriend at bedside. Disposition Plan: Patient is medically ready for discharge. Waiting for SNF bed availability and approval. Discussed with the case manager and social worker..    Consultants:   Nephrology  Vascular surgery  Procedures:  Right IJ placement  2D echo 1/20>>>LV EF: 35% - 85%, grade 2 diastolic dysfunction Antimicrobials:  Azithro 1/20>>>1/20  Vanc 1/20>> 1/22  Imipenem 1/20>>>1/23 Subjective: The patient was seen and examined at bedside. Patient reported feeling  better. Denied headache, dizziness, nausea, vomiting, chest pain, shortness of breath. Denied any arm pain.  Objective: Vitals:   05/12/16 1800 05/12/16 2154 05/13/16 0413 05/13/16 0935  BP: 127/85 125/74 (!) 146/67 138/67  Pulse: 88 81 84 89  Resp: _0 Temp: 98.4 F  (36.9 C) 98.6 F (37 C) 98.6 F (37 C) 97.9 F (36.6 C)  TempSrc: Oral Oral Oral Oral  SpO2: 100% 100% 100% 100%  Weight:      Height:        Intake/Output Summary (Last 24 hours) at 05/13/16 1610 Last data filed at 05/13/16 1330  Gross per 24 hour  Intake              702 ml  Output              300 ml  Net              402 ml   Filed Weights   05/10/16 2029 05/11/16 0709 05/11/16 1122  Weight: 98.2 kg (216 lb 7.9 oz) 98.2 kg (216 lb 7.9 oz) 95.7 kg (210 lb 15.7 oz)    Examination:  General exam: Appears calm and comfortable  Respiratory system: Clear to auscultation. Respiratory effort normal. No wheezing or crackle. Right chest as well as a catheter site clean. Cardiovascular system: S1 & S2 heard, RRR.  No pedal edema. Gastrointestinal system: Abdomen is nondistended, soft and nontender. Normal bowel sounds heard. Central nervous system: Alert and oriented. No focal neurological deficits. Extremities: Symmetric 5 x 5 power. AV fistula site has good thrill. Skin: No rashes, lesions or ulcers Psychiatry: Judgement and insight appear normal. Mood & affect appropriate.     Data Reviewed: I have personally reviewed following labs and imaging studies  CBC:  Recent Labs Lab 05/09/16 0403 05/10/16 0603 05/11/16 0827 05/12/16 0621 05/13/16 0404  WBC 12.1* 11.3* 11.1* 12.7* 10.5  HGB 9.8* 10.9* 9.8* 10.8* 10.9*  HCT 30.8* 34.2* 30.0* 33.6* 33.8*  MCV 96.3 96.3 94.9 94.9 94.9  PLT 265 258 276 288 425   Basic Metabolic Panel:  Recent Labs Lab 05/09/16 0403 05/10/16 0603 05/11/16 0827 05/12/16 0621 05/13/16 0404  NA 141 139 140 136 137  K 4.5 3.2* 3.4* 3.9 3.5  CL 102 100* 103 99* 98*  CO2 _1 GLUCOSE 126* 169* 193* 205* 205*  BUN 36* 34* 48* 34* 50*  CREATININE 2.83* 3.66* 4.37* 4.37* 5.84*  CALCIUM 9.5 9.5 8.9 9.6 9.6  PHOS 5.1* 5.9* 6.9* 6.6* 8.1*   GFR: Estimated Creatinine Clearance: 12.7 mL/min (by C-G formula based on SCr of 5.84  mg/dL (H)). Liver Function Tests:  Recent Labs Lab 05/07/16 9563  05/09/16 0403 05/10/16 0603 05/11/16 0827 05/12/16 0621 05/13/16 0404  ALT 38  --   --   --   --   --   --   ALBUMIN  --   < > 2.6* 2.8* 2.5* 3.2* 3.1*  < > = values in this interval not displayed. No results for input(s): LIPASE, AMYLASE in the last 168 hours. No results for input(s): AMMONIA in the last 168 hours. Coagulation Profile:  Recent Labs Lab 05/10/16 0603  INR 1.16   Cardiac Enzymes: No results for input(s): CKTOTAL, CKMB, CKMBINDEX, TROPONINI in the last 168 hours. BNP (last 3 results) No results for input(s): PROBNP in the last 8760 hours. HbA1C: No results for input(s): HGBA1C in the last 72 hours. CBG:  Recent Labs Lab 05/12/16  1118 05/12/16 1700 05/12/16 2144 05/13/16 0753 05/13/16 1120  GLUCAP 244* 250* 246* 184* 284*   Lipid Profile: No results for input(s): CHOL, HDL, LDLCALC, TRIG, CHOLHDL, LDLDIRECT in the last 72 hours. Thyroid Function Tests: No results for input(s): TSH, T4TOTAL, FREET4, T3FREE, THYROIDAB in the last 72 hours. Anemia Panel: No results for input(s): VITAMINB12, FOLATE, FERRITIN, TIBC, IRON, RETICCTPCT in the last 72 hours. Sepsis Labs:  Recent Labs Lab 05/07/16 0450  PROCALCITON 8.99    Recent Results (from the past 240 hour(s))  Culture, respiratory (NON-Expectorated)     Status: None   Collection Time: 05/04/16 10:14 AM  Result Value Ref Range Status   Specimen Description TRACHEAL ASPIRATE  Final   Special Requests NONE  Final   Gram Stain   Final    MODERATE WBC PRESENT,BOTH PMN AND MONONUCLEAR NO ORGANISMS SEEN    Culture NO GROWTH 2 DAYS  Final   Report Status 05/06/2016 FINAL  Final         Radiology Studies: No results found.      Scheduled Meds: . amLODipine  10 mg Oral QHS  . aspirin  81 mg Oral Daily  . atorvastatin  40 mg Oral Daily  . calcitRIOL  0.5 mcg Oral Q T,Th,Sa-HD  . chlorhexidine gluconate (MEDLINE KIT)  15  mL Mouth Rinse BID  . clopidogrel  75 mg Oral QHS  . [START ON 05/14/2016] darbepoetin (ARANESP) injection - DIALYSIS  200 mcg Intravenous Q Tue-HD  . ezetimibe  10 mg Oral Daily  . feeding supplement (NEPRO CARB STEADY)  237 mL Oral BID BM  . ferric gluconate (FERRLECIT/NULECIT) IV  125 mg Intravenous Q T,Th,Sa-HD  . heparin subcutaneous  5,000 Units Subcutaneous Q8H  . insulin aspart  0-5 Units Subcutaneous QHS  . insulin aspart  0-9 Units Subcutaneous TID WC  . insulin glargine  25 Units Subcutaneous QHS  . levothyroxine  25 mcg Oral QAC breakfast  . metoprolol tartrate  25 mg Oral BID  . pantoprazole sodium  40 mg Per Tube Daily  . sevelamer carbonate  2.4 g Oral TID WC  . sodium chloride flush  3 mL Intravenous Q12H   Continuous Infusions: . sodium chloride 10 mL/hr (05/07/16 1000)  . sodium chloride 10 mL/hr at 05/10/16 0919     LOS: 11 days    Thayer Inabinet Tanna Furry, MD [facility] Pager [phone]  If 7PM-7AM, please contact night-coverage [email or web address] Password [email or web address] 05/13/2016, 4:10 PM

## 2016-05-13 NOTE — Progress Notes (Signed)
Physical Therapy Treatment Patient Details Name: Brooke Stafford MRN: [id number] DOB: [birth year] Today's Date: 05/13/2016    History of Present Illness HPI: 61yo female with hx CAD s/p CABG 2002, CKD V (not yet on HD), HTN, CHF (EF 40-45%) presented 1/18 with 1 week hx URI symptoms with worsening productive cough, SOB.  In ER was hypoxic with sats 60's, febrile, placed on bipap.  She was admitted by [facility] with PNA v pulmonary edema. She was started on IV abx and aggressive diuresis without significant improvement.  ETT 1/20-1/23 (self-extubated).    PT Comments    Progressing steadily.  Steady with RW, but quick to fatigue, before therapist is aware due to flat affect.   Follow Up Recommendations  Supervision/Assistance - 24 hour;SNF     Equipment Recommendations       Recommendations for Other Services       Precautions / Restrictions Precautions Precautions: Fall    Mobility  Bed Mobility               General bed mobility comments: up in chair on arrival  Transfers Overall transfer level: Needs assistance Equipment used: Rolling walker (2 wheeled) Transfers: Sit to/from Stand Sit to Stand: Min guard         General transfer comment: good transfer technique  Ambulation/Gait Ambulation/Gait assistance: Min assist;+2 safety/equipment Ambulation Distance (Feet): 180 Feet Assistive device: Rolling walker (2 wheeled) Gait Pattern/deviations: Step-through pattern Gait velocity: slower   General Gait Details: steady, but tired appearing, guarded, flexed posture.   Stairs            Wheelchair Mobility    Modified Rankin (Stroke Patients Only)       Balance Overall balance assessment: Needs assistance   Sitting balance-Leahy Scale: Fair     Standing balance support: No upper extremity supported;Bilateral upper extremity supported Standing balance-Leahy Scale: Fair Standing balance comment: maintained stand briefly until pt was given a RW                     Cognition Arousal/Alertness: Awake/alert Behavior During Therapy: Flat affect Overall Cognitive Status: Within Functional Limits for tasks assessed                      Exercises      General Comments General comments (skin integrity, edema, etc.): Intented to ambulate without O2, but pt felt SOB initially although SpO3 at 98% and HR in upper 80's      Pertinent Vitals/Pain Pain Assessment: Faces Faces Pain Scale: No hurt    Home Living                      Prior Function            PT Goals (current goals can now be found in the care plan section) Acute Rehab PT Goals Patient Stated Goal: to get better PT Goal Formulation: With patient Time For Goal Achievement: 05/24/16 Potential to Achieve Goals: Good Progress towards PT goals: Progressing toward goals    Frequency    Min 3X/week      PT Plan Current plan remains appropriate    Co-evaluation             End of Session Equipment Utilized During Treatment: Oxygen Activity Tolerance: Patient tolerated treatment well;Patient limited by fatigue Patient left: in chair;with call bell/phone within reach     Time: 1350-1407 PT Time Calculation (min) (ACUTE ONLY): 17 min  Charges:  $  Gait Training: 8-22 mins                    G CodesTessie Fass Jamiah Homeyer 05/13/2016, 4:49 PM 05/13/2016  Donnella Sham, [place] [phone]  (pager)

## 2016-05-13 NOTE — Clinical Social Work Note (Signed)
Clinical Social Work Assessment  Patient Details  Name: Brooke Stafford MRN: [id number] Date of Birth: [birth year]  Date of referral:  05/13/16               Reason for consult:  Facility Placement, Discharge Planning                Permission sought to share information with:  Facility Sport and exercise psychologist, Family Supports Permission granted to share information::  Yes, Verbal Permission Granted  Name::     Soyla Murphy  Agency::  SNF's  Relationship::  Boyfriend  Contact Information:  [phone]  Housing/Transportation Living arrangements for the past 2 months:  Single Family Home Source of Information:  Patient, Medical Team, Other (Comment Required) (Boyfriend) Patient Interpreter Needed:  None Criminal Activity/Legal Involvement Pertinent to Current Situation/Hospitalization:  No - Comment as needed Significant Relationships:  Siblings, Significant Other Lives with:  Significant Other Do you feel safe going back to the place where you live?  No Need for family participation in patient care:  Yes (Comment)  Care giving concerns:  PT recommending SNF once medically stable for discharge.   Social Worker assessment / plan:  CSW met with patient. Boyfriend at bedside. CSW introduced role and explained that PT recommendations would be discussed. Patient and her boyfriend agreeable to SNF placement. No preference on facility. SNF list provided. [facility] is pending. Patient cannot discharge to SNF without [facility]. Patient will need PTAR. No further concerns. CSW encouraged patient to contact CSW as needed. CSW will continue to follow patient for support and facilitate discharge to SNF once medically stable.  Employment status:  Disabled (Comment on whether or not currently receiving Disability) Insurance information:  Medicare PT Recommendations:  [place] / Referral to community resources:  [place]  Patient/Family's Response to care:   Patient agreeable to SNF placement. Patient's sister and boyfriend supportive and involved in patient's care. Patient appreciated social work intervention.  Patient/Family's Understanding of and Emotional Response to Diagnosis, Current Treatment, and Prognosis:  Patient appears to have a good understanding of current admission. Patient appears happy with hospital care.  Emotional Assessment Appearance:  Appears stated age Attitude/Demeanor/Rapport:  Other (Pleasant) Affect (typically observed):  Accepting, Appropriate, Calm, Pleasant Orientation:  Oriented to Self, Oriented to Place, Oriented to Situation, Oriented to  Time Alcohol / Substance use:  Never Used Psych involvement (Current and /or in the community):  No (Comment)  Discharge Needs  Concerns to be addressed:  Care Coordination Readmission within the last 30 days:  No Current discharge risk:  Dependent with Mobility Barriers to Discharge:  Awaiting State Approval ([facility]), Continued Medical Work up   Candie Chroman, LCSW 05/13/2016, 12:55 PM

## 2016-05-13 NOTE — NC FL2 (Signed)
[place] LEVEL OF CARE SCREENING TOOL     IDENTIFICATION  Patient Name: Brooke Stafford Birthdate: [birth year] Sex: female Admission Date (Current Location): 05/02/2016  [facility] and [place] Number:  Herbalist and Address:  [facility], [place] [address] [place], [place] [address]      Provider Number: [id number]  Attending Physician Name and Address:  Rosita Fire, MD  Relative Name and Phone Number:       Current Level of Care: Hospital Recommended Level of Care: [place] Prior Approval Number:    Date Approved/Denied:   PASRR Number: Pending  Discharge Plan: SNF    Current Diagnoses: Patient Active Problem List   Diagnosis Date Noted  . ESRD (end stage renal disease) ([place])   . Hypokalemia 05/03/2016  . COPD exacerbation ([place]) 05/03/2016  . COPD (chronic obstructive pulmonary disease) ([place]) 04/11/2016  . CAP (community acquired pneumonia) 03/26/2016  . Acute respiratory failure with hypoxia ([place]) 03/26/2016  . Acute on chronic combined systolic and diastolic CHF (congestive heart failure) ([place]) 03/26/2016  . Chronic kidney disease (CKD), stage IV (severe) ([place]) 03/26/2016  . Medication management 03/21/2016  . Anemia of chronic disease 12/27/2015  . Acute on chronic systolic CHF (congestive heart failure) ([place])   . Acute CHF ([place]) 12/18/2015  . CHF exacerbation ([place]) 12/18/2015  . Constipation 12/18/2015  . Adjustment disorder with anxious mood 11/02/2015  . Posterior circulation stroke ([place]) 11/02/2015  . Abnormality of gait 10/23/2015  . Depression 09/28/2015  . Diabetes mellitus with circulatory complication, with long-term current use of insulin ([place]) 09/13/2015  . Diabetic retinopathy ([place]) 09/13/2015  . Grief reaction 08/24/2015  . Acute kidney injury superimposed on chronic kidney disease ([place]) 07/25/2015  . Creatinine elevation 07/25/2015  . Chronic combined systolic and diastolic  congestive heart failure ([place]) 07/25/2015  . Rash of hands 02/23/2015  . Essential hypertension   . Angina pectoris ([place]) 12/12/2014  . Abnormal nuclear stress test   . Acute combined systolic and diastolic heart failure ([place]) 12/02/2014  . Seasonal allergies 08/11/2014  . Gout 08/11/2014  . Encounter for screening mammogram for breast cancer 05/16/2014  . Screening for colon cancer 05/16/2014  . Diabetic neuropathy, type II diabetes mellitus ([place]) 01/27/2014  . Atopic eczema 01/27/2014  . Precordial pain, atypical, negative MI, Musculature Skeletal pain  11/08/2013  . CKD (chronic kidney disease) stage 5, GFR less than 15 ml/min (HCC) 11/08/2013  . Unstable angina ([place]) 09/28/2013  . Ischemic cardiomyopathy- new drop in EF 08/30/2013  . Chest pain 08/04/2013  . CAD -S/P PCI June 2015 and 12/14/14 02/01/2013  . Hypothyroidism, acquired 02/01/2013  . PVD, LSFA PTA 12/12 04/06/2011  . Hx of CABG x 6 2002 04/06/2011  . Dyslipidemia 04/06/2011    Orientation RESPIRATION BLADDER Height & Weight     Self, Time, Situation, Place  Normal Continent Weight: 210 lb 15.7 oz (95.7 kg) Height:  5' 10" (177.8 cm)  BEHAVIORAL SYMPTOMS/MOOD NEUROLOGICAL BOWEL NUTRITION STATUS   (None)  (None) Continent Diet (Renal/carb modified)  AMBULATORY STATUS COMMUNICATION OF NEEDS Skin   Extensive Assist Verbally Surgical wounds                       Personal Care Assistance Level of Assistance              Functional Limitations Info  Sight, Hearing, Speech Sight Info: Adequate Hearing Info: Adequate Speech Info: Adequate  SPECIAL CARE FACTORS FREQUENCY  Blood pressure, Diabetic urine testing, PT (By licensed PT)     PT Frequency: 5 x week              Contractures Contractures Info: Not present    Additional Factors Info  Code Status, Allergies, Psychotropic Code Status Info: Full Allergies Info: Digoxin and Related, Hydralazine, Penicillins Cross Reactors, Lisinopril,  Adhesive (Tape) Psychotropic Info: Depression, Adjustment disorder with anxious mood: no psychotropic meds         Current Medications (05/13/2016):  This is the current hospital active medication list Current Facility-Administered Medications  Medication Dose Route Frequency Provider Last Rate Last Dose  . 0.9 %  sodium chloride infusion   Intravenous Continuous Praveen Mannam, MD 10 mL/hr at 05/07/16 1000    . 0.9 %  sodium chloride infusion   Intravenous Continuous Annye Asa, MD 10 mL/hr at 05/10/16 0919    . acetaminophen (TYLENOL) tablet 650 mg  650 mg Oral Q6H PRN Rigoberto Noel, MD   650 mg at 05/12/16 1452  . albuterol (PROVENTIL) (2.5 MG/3ML) 0.083% nebulizer solution 2.5 mg  2.5 mg Nebulization Q2H PRN Edwin Dada, MD      . amLODipine (NORVASC) tablet 10 mg  10 mg Oral QHS Jamal Maes, MD   10 mg at 05/12/16 2212  . aspirin chewable tablet 81 mg  81 mg Oral Daily Edwin Dada, MD   81 mg at 05/13/16 1032  . atorvastatin (LIPITOR) tablet 40 mg  40 mg Oral Daily Edwin Dada, MD   40 mg at 05/13/16 1032  . bisacodyl (DULCOLAX) EC tablet 5 mg  5 mg Oral Daily PRN Alexis Hugelmeyer, DO      . calcitRIOL (ROCALTROL) capsule 0.5 mcg  0.5 mcg Oral Q T,Th,Sa-HD Jamal Maes, MD   0.5 mcg at 05/11/16 1146  . chlorhexidine gluconate (MEDLINE KIT) (PERIDEX) 0.12 % solution 15 mL  15 mL Mouth Rinse BID Brand Males, MD   15 mL at 05/09/16 0750  . clopidogrel (PLAVIX) tablet 75 mg  75 mg Oral QHS Edwin Dada, MD   75 mg at 05/12/16 2211  . [START ON 05/14/2016] Darbepoetin Alfa (ARANESP) injection 200 mcg  200 mcg Intravenous Q Tue-HD Jamal Maes, MD      . docusate sodium (COLACE) capsule 100 mg  100 mg Oral Daily PRN Alexis Hugelmeyer, DO   100 mg at 05/12/16 2211  . ezetimibe (ZETIA) tablet 10 mg  10 mg Oral Daily Edwin Dada, MD   10 mg at 05/13/16 1032  . feeding supplement (NEPRO CARB STEADY) liquid 237 mL  237 mL Oral BID BM  Dron Tanna Furry, MD   237 mL at 05/10/16 1407  . ferric gluconate (NULECIT) 125 mg in sodium chloride 0.9 % 100 mL IVPB  125 mg Intravenous Q T,Th,Sa-HD Jamal Maes, MD   125 mg at 05/11/16 1007  . heparin injection 5,000 Units  5,000 Units Subcutaneous Q8H Samantha J Rhyne, PA-C   5,000 Units at 05/13/16 0700  . hydrOXYzine (ATARAX/VISTARIL) tablet 25 mg  25 mg Oral TID PRN Edwin Dada, MD   25 mg at 05/07/16 1210  . insulin aspart (novoLOG) injection 0-5 Units  0-5 Units Subcutaneous QHS Geradine Girt, DO   2 Units at 05/12/16 2211  . insulin aspart (novoLOG) injection 0-9 Units  0-9 Units Subcutaneous TID WC Geradine Girt, DO   5 Units at 05/13/16 1154  . insulin glargine (LANTUS) injection  25 Units  25 Units Subcutaneous QHS Velna Ochs, MD   25 Units at 05/12/16 2212  . levothyroxine (SYNTHROID, LEVOTHROID) tablet 25 mcg  25 mcg Oral QAC breakfast Edwin Dada, MD   25 mcg at 05/13/16 0826  . loperamide (IMODIUM) capsule 2 mg  2 mg Oral PRN Marshell Garfinkel, MD   2 mg at 05/08/16 1343  . metoprolol tartrate (LOPRESSOR) tablet 25 mg  25 mg Oral BID Dron Tanna Furry, MD   25 mg at 05/13/16 1032  . nitroGLYCERIN (NITROSTAT) SL tablet 0.4 mg  0.4 mg Sublingual Q5 min PRN Velvet Bathe, MD   0.4 mg at 05/03/16 1718  . pantoprazole sodium (PROTONIX) 40 mg/20 mL oral suspension 40 mg  40 mg Per Tube Daily Velvet Bathe, MD   40 mg at 05/12/16 1034  . sevelamer carbonate (RENVELA) powder PACK 2.4 g  2.4 g Oral TID WC Mauricia Area, MD      . sodium chloride flush (NS) 0.9 % injection 10-40 mL  10-40 mL Intracatheter PRN Praveen Mannam, MD      . sodium chloride flush (NS) 0.9 % injection 3 mL  3 mL Intravenous Q12H Edwin Dada, MD   3 mL at 05/13/16 1033     Discharge Medications: Please see discharge summary for a list of discharge medications.  Relevant Imaging Results:  Relevant Lab Results:   Additional Information SS#: [id number]. HD on  Tuesday, Thursday, Saturday at [facility].  Candie Chroman, LCSW

## 2016-05-13 NOTE — Progress Notes (Signed)
Inpatient Diabetes Program Recommendations  AACE/ADA: New Consensus Statement on Inpatient Glycemic Control (2015)  Target Ranges:  Prepandial:   less than 140 mg/dL      Peak postprandial:   less than 180 mg/dL (1-2 hours)      Critically ill patients:  140 - 180 mg/dL   Results for MALAK, DUCHESNEAU (MRN [id number]) as of 05/13/2016 10:44  Ref. Range 05/12/2016 07:36 05/12/2016 11:18 05/12/2016 17:00 05/12/2016 21:44  Glucose-Capillary Latest Ref Range: 65 - 99 mg/dL 213 (H) 244 (H) 250 (H) 246 (H)   Results for BABETTA, PATERSON (MRN [id number]) as of 05/13/2016 10:44  Ref. Range 05/13/2016 07:53  Glucose-Capillary Latest Ref Range: 65 - 99 mg/dL 184 (H)    Home DM Meds: Toujeo 30 units QHS       Novolog 10-20 units TID per SSI  Current Insulin Orders: Lantus 25 units QHS      Novolog Sensitive Correction Scale/ SSI (0-9 units) TID AC + HS       MD- Please consider the following in-hospital insulin adjustments:  1. Increase Lantus to 30 units QHS (home dose)  2. Start low dose Novolog Meal Coverage: Novolog 4 units TID with meals (hold if pt eats <50% of meal)     --Will follow patient during hospitalization--  Wyn Quaker RN, MSN, CDE Diabetes Coordinator Inpatient Glycemic Control Team Team Pager: [phone] (8a-5p)

## 2016-05-13 NOTE — Care Management Important Message (Signed)
Important Message  Patient Details  Name: Brooke Stafford MRN: [id number] Date of Birth: 1956-04-06   Medicare Important Message Given:  Yes    Orbie Pyo 05/13/2016, 1:53 PM

## 2016-05-13 NOTE — Clinical Social Work Placement (Signed)
   CLINICAL SOCIAL WORK PLACEMENT  NOTE  Date:  05/13/2016  Patient Details  Name: Brooke Stafford MRN: [id number] Date of Birth: 1956/01/29  Clinical Social Work is seeking post-discharge placement for this patient at the [place] level of care (*CSW will initial, date and re-position this form in  chart as items are completed):  Yes   Patient/family provided with [place] Work Department's list of facilities offering this level of care within the geographic area requested by the patient (or if unable, by the patient's family).  Yes   Patient/family informed of their freedom to choose among providers that offer the needed level of care, that participate in Medicare, Medicaid or managed care program needed by the patient, have an available bed and are willing to accept the patient.  Yes   Patient/family informed of [facility]'s ownership interest in [facility] and [facility], as well as of the fact that they are under no obligation to receive care at these facilities.  PASRR submitted to EDS on 05/13/16     PASRR number received on       Existing PASRR number confirmed on       FL2 transmitted to all facilities in geographic area requested by pt/family on 05/13/16     FL2 transmitted to all facilities within larger geographic area on       Patient informed that his/her managed care company has contracts with or will negotiate with certain facilities, including the following:            Patient/family informed of bed offers received.  Patient chooses bed at       Physician recommends and patient chooses bed at      Patient to be transferred to   on  .  Patient to be transferred to facility by       Patient family notified on   of transfer.  Name of family member notified:        PHYSICIAN Please sign FL2     Additional Comment:    _______________________________________________ Candie Chroman, LCSW 05/13/2016, 12:58  PM

## 2016-05-13 NOTE — Progress Notes (Signed)
Subjective: Interval History: has no complaint, hand ok.  Objective: Vital signs in last 24 hours: Temp:  [97.9 F (36.6 C)-98.6 F (37 C)] 97.9 F (36.6 C) (01/29 0935) Pulse Rate:  [81-89] 89 (01/29 0935) Resp:  [16-18] 17 (01/29 0935) BP: (125-146)/(64-85) 138/67 (01/29 0935) SpO2:  [100 %] 100 % (01/29 0935) Weight change:   Intake/Output from previous day: 01/28 0701 - 01/29 0700 In: 480 [P.O.:480] Out: 0  Intake/Output this shift: No intake/output data recorded.  General appearance: alert, cooperative, no distress and moderately obese Resp: diminished breath sounds bibasilar Chest wall: RIJ cath Cardio: S1, S2 normal and systolic murmur: holosystolic 2/6, blowing at apex GI: obese,pos bs, liver down 5 cm Extremities: edema 1+ and AVF LUA BVT  Lab Results:  Recent Labs  05/12/16 0621 05/13/16 0404  WBC 12.7* 10.5  HGB 10.8* 10.9*  HCT 33.6* 33.8*  PLT 288 311   BMET:  Recent Labs  05/12/16 0621 05/13/16 0404  NA 136 137  K 3.9 3.5  CL 99* 98*  CO2 23 25  GLUCOSE 205* 205*  BUN 34* 50*  CREATININE 4.37* 5.84*  CALCIUM 9.6 9.6   No results for input(s): PTH in the last 72 hours. Iron Studies: No results for input(s): IRON, TIBC, TRANSFERRIN, FERRITIN in the last 72 hours.  Studies/Results: No results found.  I have reviewed the patient's current medications.  Assessment/Plan: 1 ESRD HD TTS, vol xs yet , [facility] 2 HTN lower ,lower meds 3 ^ phos, ^ binders 4 HPTH vit D 5 Anemia esa 6 Obesity P HD, esa, vit D, ^ Binder    LOS: 11 days   Brooke Stafford L 05/13/2016,10:05 AM

## 2016-05-14 DIAGNOSIS — I132 Hypertensive heart and chronic kidney disease with heart failure and with stage 5 chronic kidney disease, or end stage renal disease: Secondary | ICD-10-CM | POA: Diagnosis not present

## 2016-05-14 DIAGNOSIS — D631 Anemia in chronic kidney disease: Secondary | ICD-10-CM | POA: Diagnosis not present

## 2016-05-14 DIAGNOSIS — E782 Mixed hyperlipidemia: Secondary | ICD-10-CM | POA: Diagnosis not present

## 2016-05-14 DIAGNOSIS — J159 Unspecified bacterial pneumonia: Secondary | ICD-10-CM | POA: Diagnosis not present

## 2016-05-14 DIAGNOSIS — I5041 Acute combined systolic (congestive) and diastolic (congestive) heart failure: Secondary | ICD-10-CM | POA: Diagnosis not present

## 2016-05-14 DIAGNOSIS — I5021 Acute systolic (congestive) heart failure: Secondary | ICD-10-CM | POA: Diagnosis not present

## 2016-05-14 DIAGNOSIS — Z9861 Coronary angioplasty status: Secondary | ICD-10-CM | POA: Diagnosis not present

## 2016-05-14 DIAGNOSIS — E1129 Type 2 diabetes mellitus with other diabetic kidney complication: Secondary | ICD-10-CM | POA: Diagnosis not present

## 2016-05-14 DIAGNOSIS — Z992 Dependence on renal dialysis: Secondary | ICD-10-CM | POA: Diagnosis not present

## 2016-05-14 DIAGNOSIS — E785 Hyperlipidemia, unspecified: Secondary | ICD-10-CM | POA: Diagnosis not present

## 2016-05-14 DIAGNOSIS — F418 Other specified anxiety disorders: Secondary | ICD-10-CM | POA: Diagnosis not present

## 2016-05-14 DIAGNOSIS — E1159 Type 2 diabetes mellitus with other circulatory complications: Secondary | ICD-10-CM | POA: Diagnosis not present

## 2016-05-14 DIAGNOSIS — E0851 Diabetes mellitus due to underlying condition with diabetic peripheral angiopathy without gangrene: Secondary | ICD-10-CM

## 2016-05-14 DIAGNOSIS — Z794 Long term (current) use of insulin: Secondary | ICD-10-CM | POA: Diagnosis not present

## 2016-05-14 DIAGNOSIS — R531 Weakness: Secondary | ICD-10-CM | POA: Diagnosis not present

## 2016-05-14 DIAGNOSIS — I5040 Unspecified combined systolic (congestive) and diastolic (congestive) heart failure: Secondary | ICD-10-CM | POA: Diagnosis not present

## 2016-05-14 DIAGNOSIS — E1140 Type 2 diabetes mellitus with diabetic neuropathy, unspecified: Secondary | ICD-10-CM | POA: Diagnosis not present

## 2016-05-14 DIAGNOSIS — M109 Gout, unspecified: Secondary | ICD-10-CM | POA: Diagnosis not present

## 2016-05-14 DIAGNOSIS — E876 Hypokalemia: Secondary | ICD-10-CM | POA: Diagnosis not present

## 2016-05-14 DIAGNOSIS — Z951 Presence of aortocoronary bypass graft: Secondary | ICD-10-CM | POA: Diagnosis not present

## 2016-05-14 DIAGNOSIS — E039 Hypothyroidism, unspecified: Secondary | ICD-10-CM | POA: Diagnosis not present

## 2016-05-14 DIAGNOSIS — D509 Iron deficiency anemia, unspecified: Secondary | ICD-10-CM | POA: Diagnosis not present

## 2016-05-14 DIAGNOSIS — N185 Chronic kidney disease, stage 5: Secondary | ICD-10-CM | POA: Diagnosis not present

## 2016-05-14 DIAGNOSIS — J9601 Acute respiratory failure with hypoxia: Secondary | ICD-10-CM | POA: Diagnosis not present

## 2016-05-14 DIAGNOSIS — J3089 Other allergic rhinitis: Secondary | ICD-10-CM | POA: Diagnosis not present

## 2016-05-14 DIAGNOSIS — I10 Essential (primary) hypertension: Secondary | ICD-10-CM | POA: Diagnosis not present

## 2016-05-14 DIAGNOSIS — E875 Hyperkalemia: Secondary | ICD-10-CM | POA: Diagnosis not present

## 2016-05-14 DIAGNOSIS — I2510 Atherosclerotic heart disease of native coronary artery without angina pectoris: Secondary | ICD-10-CM | POA: Diagnosis not present

## 2016-05-14 DIAGNOSIS — I5043 Acute on chronic combined systolic (congestive) and diastolic (congestive) heart failure: Secondary | ICD-10-CM | POA: Diagnosis not present

## 2016-05-14 DIAGNOSIS — J441 Chronic obstructive pulmonary disease with (acute) exacerbation: Secondary | ICD-10-CM | POA: Diagnosis not present

## 2016-05-14 DIAGNOSIS — N2581 Secondary hyperparathyroidism of renal origin: Secondary | ICD-10-CM | POA: Diagnosis not present

## 2016-05-14 DIAGNOSIS — D638 Anemia in other chronic diseases classified elsewhere: Secondary | ICD-10-CM | POA: Diagnosis not present

## 2016-05-14 DIAGNOSIS — N186 End stage renal disease: Secondary | ICD-10-CM | POA: Diagnosis not present

## 2016-05-14 DIAGNOSIS — J9600 Acute respiratory failure, unspecified whether with hypoxia or hypercapnia: Secondary | ICD-10-CM | POA: Diagnosis not present

## 2016-05-14 DIAGNOSIS — Z23 Encounter for immunization: Secondary | ICD-10-CM | POA: Diagnosis not present

## 2016-05-14 DIAGNOSIS — E8770 Fluid overload, unspecified: Secondary | ICD-10-CM | POA: Diagnosis not present

## 2016-05-14 DIAGNOSIS — N179 Acute kidney failure, unspecified: Secondary | ICD-10-CM | POA: Diagnosis not present

## 2016-05-14 DIAGNOSIS — I770 Arteriovenous fistula, acquired: Secondary | ICD-10-CM | POA: Diagnosis not present

## 2016-05-14 DIAGNOSIS — I5042 Chronic combined systolic (congestive) and diastolic (congestive) heart failure: Secondary | ICD-10-CM | POA: Diagnosis not present

## 2016-05-14 DIAGNOSIS — M19011 Primary osteoarthritis, right shoulder: Secondary | ICD-10-CM | POA: Diagnosis not present

## 2016-05-14 DIAGNOSIS — J42 Unspecified chronic bronchitis: Secondary | ICD-10-CM | POA: Diagnosis not present

## 2016-05-14 DIAGNOSIS — N189 Chronic kidney disease, unspecified: Secondary | ICD-10-CM | POA: Diagnosis not present

## 2016-05-14 LAB — CBC
HCT: 29.6 % — ABNORMAL LOW (ref 36.0–46.0)
Hemoglobin: 9.7 g/dL — ABNORMAL LOW (ref 12.0–15.0)
MCH: 30.3 pg (ref 26.0–34.0)
MCHC: 32.8 g/dL (ref 30.0–36.0)
MCV: 92.5 fL (ref 78.0–100.0)
PLATELETS: 286 10*3/uL (ref 150–400)
RBC: 3.2 MIL/uL — AB (ref 3.87–5.11)
RDW: 15.1 % (ref 11.5–15.5)
WBC: 10.6 10*3/uL — AB (ref 4.0–10.5)

## 2016-05-14 LAB — GLUCOSE, CAPILLARY
GLUCOSE-CAPILLARY: 197 mg/dL — AB (ref 65–99)
Glucose-Capillary: 145 mg/dL — ABNORMAL HIGH (ref 65–99)
Glucose-Capillary: 253 mg/dL — ABNORMAL HIGH (ref 65–99)

## 2016-05-14 LAB — RENAL FUNCTION PANEL
Albumin: 3 g/dL — ABNORMAL LOW (ref 3.5–5.0)
Anion gap: 15 (ref 5–15)
BUN: 67 mg/dL — AB (ref 6–20)
CALCIUM: 9.3 mg/dL (ref 8.9–10.3)
CO2: 23 mmol/L (ref 22–32)
CREATININE: 6.02 mg/dL — AB (ref 0.44–1.00)
Chloride: 97 mmol/L — ABNORMAL LOW (ref 101–111)
GFR, EST AFRICAN AMERICAN: 8 mL/min — AB (ref >60)
GFR, EST NON AFRICAN AMERICAN: 7 mL/min — AB (ref >60)
Glucose, Bld: 240 mg/dL — ABNORMAL HIGH (ref 65–99)
Phosphorus: 8.9 mg/dL — ABNORMAL HIGH (ref 2.5–4.6)
Potassium: 3.4 mmol/L — ABNORMAL LOW (ref 3.5–5.1)
SODIUM: 135 mmol/L (ref 135–145)

## 2016-05-14 MED ORDER — DARBEPOETIN ALFA 200 MCG/0.4ML IJ SOSY: 200.0000 ug | PREFILLED_SYRINGE | INTRAMUSCULAR | Status: DC

## 2016-05-14 MED ORDER — INSULIN GLARGINE 300 UNIT/ML ~~LOC~~ SOPN: 30.0000 [IU] | PEN_INJECTOR | Freq: Every day | SUBCUTANEOUS | Status: DC

## 2016-05-14 MED ORDER — SODIUM CHLORIDE 0.9 % IV SOLN: 100.0000 mL | INTRAVENOUS | Status: DC | PRN

## 2016-05-14 MED ORDER — DARBEPOETIN ALFA 200 MCG/0.4ML IJ SOSY
PREFILLED_SYRINGE | INTRAMUSCULAR | Status: AC
  Administered 2016-05-14: 200 ug via INTRAVENOUS
  Filled 2016-05-14: qty 0.4

## 2016-05-14 MED ORDER — HEPARIN SODIUM (PORCINE) 1000 UNIT/ML DIALYSIS: 1000.0000 [IU] | INTRAMUSCULAR | Status: DC | PRN

## 2016-05-14 MED ORDER — METOPROLOL TARTRATE 25 MG PO TABS: 25.0000 mg | ORAL_TABLET | Freq: Two times a day (BID) | ORAL | 0 refills | Status: DC

## 2016-05-14 MED ORDER — LIDOCAINE-PRILOCAINE 2.5-2.5 % EX CREA: 1.0000 "application " | TOPICAL_CREAM | CUTANEOUS | Status: DC | PRN

## 2016-05-14 MED ORDER — CALCITRIOL 0.5 MCG PO CAPS
ORAL_CAPSULE | ORAL | Status: AC
  Administered 2016-05-14: 0.5 ug via ORAL
  Filled 2016-05-14: qty 1

## 2016-05-14 MED ORDER — LIDOCAINE HCL (PF) 1 % IJ SOLN: 5.0000 mL | INTRAMUSCULAR | Status: DC | PRN

## 2016-05-14 MED ORDER — ALTEPLASE 2 MG IJ SOLR: 2.0000 mg | Freq: Once | INTRAMUSCULAR | Status: DC | PRN

## 2016-05-14 MED ORDER — SODIUM CHLORIDE 0.9 % IV SOLN: 125.0000 mg | INTRAVENOUS | Status: DC

## 2016-05-14 MED ORDER — SEVELAMER CARBONATE 2.4 G PO PACK: 2.4000 g | PACK | Freq: Three times a day (TID) | ORAL | 0 refills | Status: DC

## 2016-05-14 MED ORDER — PENTAFLUOROPROP-TETRAFLUOROETH EX AERO: 1.0000 "application " | INHALATION_SPRAY | CUTANEOUS | Status: DC | PRN

## 2016-05-14 MED ORDER — HEPARIN SODIUM (PORCINE) 1000 UNIT/ML DIALYSIS
100.0000 [IU]/kg | INTRAMUSCULAR | Status: DC | PRN
  Administered 2016-05-14: 9600 [IU] via INTRAVENOUS_CENTRAL
  Filled 2016-05-14: qty 10

## 2016-05-14 NOTE — Progress Notes (Signed)
Update change of schedule will be Mon ,Wed and Fri chair time 1:00 need to be there at 11:45 for 1st treatment and sign paperwork at [facility]) [address]

## 2016-05-14 NOTE — Discharge Summary (Signed)
Physician Discharge Summary  Brooke Stafford AOZ:[id number] DOB: May 01, 1955 DOA: 05/02/2016  PCP: No primary care provider on file.  Admit date: 05/02/2016 Discharge date: 05/14/2016  Admitted From:home Disposition:SNF  Recommendations for Outpatient Follow-up:  1. Follow up with PCP in 1-2 weeks 2. Please obtain BMP/CBC in one week 3. Continue dialysis as scheduled   Home Health:SNF Equipment/Devices:none Discharge Condition:stable CODE STATUS:full Diet recommendation: Carb modified heart healthy diet.  Brief/Interim Summary: 61yo female with hx CAD s/p CABG 2002, CKD V, HTN, CHF (EF 40-45%) presented 1/18 with 1 week hx URI symptoms with worsening productive cough, SOB. In ER was hypoxic with sats 60's, febrile, placed on bipap. She was admitted by [facility] with PNA v pulmonary edema. She was started on IV abx and aggressive diuresis without significant improvement. She continued to require bipap and on the am of 1/20 had significantly increased WOB, minimal UOP despite multiple doses lasix and PCCM consulted. Patient was moved to ICU, intubated. She was self extubated, started on HD via temporary catheter and transferred to the floor 1/25.  # Acute hypoxic respiratory failure - Multifactorial due to pneumonia and acute on chronic congestive heart failure in underlying COPD - Patient was intubated and self extubated.  - treated with antibiotics, therapy complete and ABX have been discontinued  - Continue bronchodilators  - patient is on room air today, stable  # Acute on chronic combined congestive heart failure - patient has history of coronary artery disease status post CABG, PCI - Echocardiogram reviewed. Patient has EF of 35-40%. Also with grade 2 diastolic dysfunction.  - now hemodialysis dependent and volume management with HD  -Continue aspirin, Lipitor, Plavix, Zetia, metoprolol. Advised outpatient follow up.  # Acute on chronic kidney disease stage V, now new ESRD HD  dependent: - clipped to [facility] 3 times/week. - Has permanent access. TDC and L brachioceph AVF done by Dr. Donnetta Hutching 1/26 -Hemodialysis treatment as per nephrologist.  # Left hand pain on 1/28, improved now. - concerning for steal  - appreciate vascular surgery input -no pain today. Outpatient follow up with vascular surgery.   # Anemia of ESRD - continue Aranesp and irone as renal.  # Secondary HPT  - phos binders, and vitamin D treatment as per nephrologist.  # DM type II with complications of nephropathy  - continue insulin regimen as below. Monitor blood sugar level.  # Hypothyroidism - Continue Synthroid  # HTN, essential  - on Norvasc and clonidine  - reasonable inpatient control   Obesity  - Body mass index is 30.27 kg/m.  Patient is medically stable. As per case manager, outpatient hemodialysis was arranged. Patient will be discharged when bed is available and SNF. Recommended outpatient follow-up.   Discharge Diagnoses:  Principal Problem:   Acute respiratory failure with hypoxia [facility]) Active Problems:   CAD -S/P PCI June 2015 and 12/14/14   Hypothyroidism, acquired   CKD (chronic kidney disease) stage 5, GFR less than 15 ml/min (HCC)   Acute combined systolic and diastolic heart failure (HCC)   Essential hypertension   Diabetes mellitus with circulatory complication, with long-term current use of insulin (HCC)   Anemia of chronic disease   Hypokalemia   COPD exacerbation (HCC)   ESRD (end stage renal disease) [facility])    Discharge Instructions  Discharge Instructions    Call MD for:  difficulty breathing, headache or visual disturbances    Complete by:  As directed    Call MD for:  extreme fatigue  Complete by:  As directed    Call MD for:  hives    Complete by:  As directed    Call MD for:  persistant dizziness or light-headedness    Complete by:  As directed    Call MD for:  persistant nausea and vomiting    Complete by:  As  directed    Call MD for:  redness, tenderness, or signs of infection (pain, swelling, redness, odor or green/yellow discharge around incision site)    Complete by:  As directed    Call MD for:  severe uncontrolled pain    Complete by:  As directed    Call MD for:  temperature >100.4    Complete by:  As directed    Diet - low sodium heart healthy    Complete by:  As directed    Diet Carb Modified    Complete by:  As directed    Discharge instructions    Complete by:  As directed    Dialysis schedule: Update change of schedule will be Mon ,Wed and Fri chair time 1:00 need to be there at 11:45 for 1st treatment and sign paperwork at [facility]) [place]   Increase activity slowly    Complete by:  As directed      Allergies as of 05/14/2016      Reactions   Digoxin And Related Other (See Comments)   Patient stated she almost died. Had flu like symptoms as well as diarrhea.   Hydralazine Shortness Of Breath   Penicillins Cross Reactors Hives, Other (See Comments)   HIGH FEVER Has patient had a PCN reaction causing immediate rash, facial/tongue/throat swelling, SOB or lightheadedness with hypotension: No Has patient had a PCN reaction causing SEVERE RASH INVOLVING MUCUS MEMBRANES or SKIN NECROSIS  #  #  #  YES  #  #  #  Has patient had a PCN reaction that required hospitalization: Unk Has patient had a PCN reaction occurring within the last 10 years: Unk If all of the above answers are "NO", then may proceed with Cephalosporin use.   Lisinopril Other (See Comments)   Felt like she had the flu. Was very sick!!!   Adhesive [tape] Rash   bruising      Medication List    STOP taking these medications   acetaminophen-codeine 300-30 MG tablet Commonly known as:  TYLENOL #3   clonazePAM 0.5 MG tablet Commonly known as:  KLONOPIN   furosemide 20 MG tablet Commonly known as:  LASIX   gabapentin 400 MG capsule Commonly known as:  NEURONTIN   isosorbide mononitrate 60 MG  24 hr tablet Commonly known as:  IMDUR   loratadine 10 MG tablet Commonly known as:  CLARITIN   magnesium citrate Soln   metoprolol succinate 50 MG 24 hr tablet Commonly known as:  TOPROL-XL   simethicone 125 MG chewable tablet Commonly known as:  MYLICON     TAKE these medications   albuterol 108 (90 Base) MCG/ACT inhaler Commonly known as:  PROVENTIL HFA;VENTOLIN HFA Inhale 2 puffs into the lungs every 6 (six) hours as needed for wheezing or shortness of breath.   albuterol (2.5 MG/3ML) 0.083% nebulizer solution Commonly known as:  PROVENTIL Take 3 mLs (2.5 mg total) by nebulization every 6 (six) hours as needed for wheezing or shortness of breath.   allopurinol 300 MG tablet Commonly known as:  ZYLOPRIM Take 1 tablet (300 mg total) by mouth daily. What changed:  when to take  this   amLODipine 10 MG tablet Commonly known as:  NORVASC take 1 tablet by mouth once daily What changed:  See the new instructions.   aspirin 81 MG chewable tablet Chew 1 tablet (81 mg total) by mouth daily.   atorvastatin 40 MG tablet Commonly known as:  LIPITOR Take 1 tablet (40 mg total) by mouth daily.   B-D UF III MINI PEN NEEDLES 31G X 5 MM Misc Generic drug:  Insulin Pen Needle USE TO INJECT INSULIN 4 TIMES A DAY AS INSTRUCTED   benzonatate 100 MG capsule Commonly known as:  TESSALON Take 100 mg by mouth 3 (three) times daily as needed for cough.   calcitRIOL 0.25 MCG capsule Commonly known as:  ROCALTROL Take 0.25 mcg by mouth daily.   CLEAR EYES OP Place 1 drop into both eyes as needed (for dry eyes).   cloNIDine 0.2 MG tablet Commonly known as:  CATAPRES Take 1 tablet (0.2 mg total) by mouth 3 (three) times daily.   clopidogrel 75 MG tablet Commonly known as:  PLAVIX Take 1 tablet (75 mg total) by mouth daily.   Darbepoetin Alfa 200 MCG/0.4ML Sosy injection Commonly known as:  ARANESP Inject 0.4 mLs (200 mcg total) into the vein every Tuesday with  hemodialysis. Start taking on:  05/21/2016   ezetimibe 10 MG tablet Commonly known as:  ZETIA Take 1 tablet (10 mg total) by mouth daily.   ferric gluconate 125 mg in sodium chloride 0.9 % 100 mL Inject 125 mg into the vein Every Tuesday,Thursday,and Saturday with dialysis. Start taking on:  05/16/2016   fluticasone 50 MCG/ACT nasal spray Commonly known as:  FLONASE Place 2 sprays into both nostrils daily as needed for allergies or rhinitis.   Fluticasone-Salmeterol 250-50 MCG/DOSE Aepb Commonly known as:  ADVAIR DISKUS Inhale 1 puff into the lungs 2 (two) times daily.   hydrOXYzine 25 MG tablet Commonly known as:  ATARAX/VISTARIL Take 1 tablet (25 mg total) by mouth 3 (three) times daily.   insulin aspart 100 UNIT/ML FlexPen Commonly known as:  NOVOLOG FLEXPEN Inject 10-30 units into the skin 3 times daily, plus sliding scale as instructed. What changed:  how much to take  how to take this  when to take this  additional instructions   Insulin Glargine 300 UNIT/ML Sopn Commonly known as:  TOUJEO SOLOSTAR Inject 30 Units into the skin at bedtime.   levothyroxine 25 MCG tablet Commonly known as:  SYNTHROID, LEVOTHROID take 1 tablet by mouth every morning BEFORE BREAKFAST What changed:  See the new instructions.   metoprolol tartrate 25 MG tablet Commonly known as:  LOPRESSOR Take 1 tablet (25 mg total) by mouth 2 (two) times daily.   nitroGLYCERIN 0.4 MG SL tablet Commonly known as:  NITROSTAT Place 1 tablet (0.4 mg total) under the tongue every 5 (five) minutes as needed.   prednisoLONE acetate 1 % ophthalmic suspension Commonly known as:  PRED FORTE Place 1 drop into both eyes 3 (three) times daily.   ranitidine 75 MG tablet Commonly known as:  ZANTAC 75 Take 1 tablet (75 mg total) by mouth 2 (two) times daily. What changed:  when to take this  reasons to take this   senna-docusate 8.6-50 MG tablet Commonly known as:  Senokot-S Take 1 tablet by mouth at  bedtime as needed for mild constipation.   sevelamer carbonate 2.4 g Pack Commonly known as:  RENVELA Take 2.4 g by mouth 3 (three) times daily with meals.   triamcinolone ointment  0.5 % Commonly known as:  KENALOG Apply 1 application topically 2 (two) times daily. What changed:  when to take this  reasons to take this            Durable Medical Equipment        Start     Ordered   05/10/16 1642  For home use only DME Shower stool  Once    Comments:  Pt will d/c in next 1-2 days.   05/10/16 1642   05/10/16 1641  For home use only DME 3 n 1  Once    Comments:  Pt for d/c in next 1-2 days.   05/10/16 [place] Follow up on 05/08/2016.   Why:  [facility] appointment on 05/08/16 at 2:00 pm with Dr. Jarold Song. Contact information: [place] [address] [phone]       Curt Jews, MD Follow up in 4 week(s).   Specialties:  Vascular Surgery, Cardiology Why:  Office will call you to arrange your appt (sent) Contact information: [address] [place] [address] [phone]          Allergies  Allergen Reactions  . Digoxin And Related Other (See Comments)    Patient stated she almost died. Had flu like symptoms as well as diarrhea.  . Hydralazine Shortness Of Breath  . Penicillins Cross Reactors Hives and Other (See Comments)    HIGH FEVER  Has patient had a PCN reaction causing immediate rash, facial/tongue/throat swelling, SOB or lightheadedness with hypotension: No Has patient had a PCN reaction causing SEVERE RASH INVOLVING MUCUS MEMBRANES or SKIN NECROSIS  #  #  #  YES  #  #  #  Has patient had a PCN reaction that required hospitalization: Unk Has patient had a PCN reaction occurring within the last 10 years: Unk If all of the above answers are "NO", then may proceed with Cephalosporin use.   [name] Lisinopril Other (See Comments)    Felt like  she had the flu. Was very sick!!!  . Adhesive [Tape] Rash    bruising    Consultations:  Nephrology  Vascular surgery  Procedures/Studies:  Right IJ placement  2D echo 1/20>>>LV EF: 35% - 31%, grade 2 diastolic dysfunction Antimicrobials:  Azithro 1/20>>>1/20  Vanc 1/20>> 1/22  Imipenem 1/20>>>1/23 Subjective: The patient was seen and examined at bedside. Reported feeling good. Denied headache, dizziness, nausea, vomiting, chest pain, shortness of breath.  Discharge Exam: Vitals:   05/14/16 1230 05/14/16 1300  BP: 121/67 (!) 145/70  Pulse: (!) 105 (!) 107  Resp:    Temp:     Vitals:   05/14/16 1130 05/14/16 1200 05/14/16 1230 05/14/16 1300  BP: 105/67 108/84 121/67 (!) 145/70  Pulse: (!) 115 (!) 109 (!) 105 (!) 107  Resp:      Temp:      TempSrc:      SpO2:      Weight:      Height:        General: Pt is alert, awake, not in acute distress Cardiovascular: RRR, S1/S2 +, no rubs, no gallops Respiratory: CTA bilaterally, no wheezing, no rhonchi Abdominal: Soft, NT, ND, bowel sounds + Extremities: no edema, no cyanosis Right chest HD catheter site is clean. Left upper extremity AV fistula site has good thrill and bruit. Neurologic: Alert, awake, following commands.  The results of significant diagnostics from this hospitalization (  including imaging, microbiology, ancillary and laboratory) are listed below for reference.     Microbiology: No results found for this or any previous visit (from the past 240 hour(s)).   Labs: BNP (last 3 results)  Recent Labs  03/26/16 0145 04/01/16 2204 05/03/16 0328  BNP 2,430.6* 1,074.4* 1,761.6*   Basic Metabolic Panel:  Recent Labs Lab 05/10/16 0603 05/11/16 0827 05/12/16 0621 05/13/16 0404 05/14/16 0900  NA 139 140 136 137 135  K 3.2* 3.4* 3.9 3.5 3.4*  CL 100* 103 99* 98* 97*  CO2 27 25 23 25 23   GLUCOSE 169* 193* 205* 205* 240*  BUN 34* 48* 34* 50* 67*  CREATININE 3.66* 4.37* 4.37* 5.84* 6.02*   CALCIUM 9.5 8.9 9.6 9.6 9.3  PHOS 5.9* 6.9* 6.6* 8.1* 8.9*   Liver Function Tests:  Recent Labs Lab 05/10/16 0603 05/11/16 0827 05/12/16 0621 05/13/16 0404 05/14/16 0900  ALBUMIN 2.8* 2.5* 3.2* 3.1* 3.0*   No results for input(s): LIPASE, AMYLASE in the last 168 hours. No results for input(s): AMMONIA in the last 168 hours. CBC:  Recent Labs Lab 05/10/16 0603 05/11/16 0827 05/12/16 0621 05/13/16 0404 05/14/16 0900  WBC 11.3* 11.1* 12.7* 10.5 10.6*  HGB 10.9* 9.8* 10.8* 10.9* 9.7*  HCT 34.2* 30.0* 33.6* 33.8* 29.6*  MCV 96.3 94.9 94.9 94.9 92.5  PLT 258 276 288 311 286   Cardiac Enzymes: No results for input(s): CKTOTAL, CKMB, CKMBINDEX, TROPONINI in the last 168 hours. BNP: Invalid input(s): POCBNP CBG:  Recent Labs Lab 05/13/16 0753 05/13/16 1120 05/13/16 1615 05/13/16 2110 05/14/16 0745  GLUCAP 184* 284* 229* 279* 197*   D-Dimer No results for input(s): DDIMER in the last 72 hours. Hgb A1c No results for input(s): HGBA1C in the last 72 hours. Lipid Profile No results for input(s): CHOL, HDL, LDLCALC, TRIG, CHOLHDL, LDLDIRECT in the last 72 hours. Thyroid function studies No results for input(s): TSH, T4TOTAL, T3FREE, THYROIDAB in the last 72 hours.  Invalid input(s): FREET3 Anemia work up No results for input(s): VITAMINB12, FOLATE, FERRITIN, TIBC, IRON, RETICCTPCT in the last 72 hours. Urinalysis    Component Value Date/Time   COLORURINE YELLOW 03/26/2016 [place] 03/26/2016 1232   LABSPEC 1.005 03/26/2016 1232   PHURINE 7.0 03/26/2016 1232   GLUCOSEU 150 (A) 03/26/2016 1232   HGBUR MODERATE (A) 03/26/2016 [place] 03/26/2016 1232   BILIRUBINUR neg 06/02/2013 1540   KETONESUR NEGATIVE 03/26/2016 1232   PROTEINUR >=300 (A) 03/26/2016 1232   UROBILINOGEN 0.2 12/14/2014 1700   NITRITE NEGATIVE 03/26/2016 1232   LEUKOCYTESUR TRACE (A) 03/26/2016 1232   Sepsis Labs Invalid input(s): PROCALCITONIN,  WBC,   LACTICIDVEN Microbiology No results found for this or any previous visit (from the past 240 hour(s)).   Time coordinating discharge: 26 minutes  SIGNED:   Rosita Fire, MD  [facility] 05/14/2016, 1:12 PM  If 7PM-7AM, please contact night-coverage [email or web address] Password [email or web address]

## 2016-05-14 NOTE — Progress Notes (Signed)
Subjective:  Interval History: has complaints irritated with drssing on cath.  Objective: Vital signs in last 24 hours: Temp:  [97.4 F (36.3 C)-98.6 F (37 C)] 98.6 F (37 C) (01/30 0857) Pulse Rate:  [61-92] 84 (01/30 0930) Resp:  [12-18] 12 (01/30 0857) BP: (135-160)/(66-78) 149/69 (01/30 0930) SpO2:  [95 %-100 %] 100 % (01/30 0643) Weight:  [96.7 kg (213 lb 3 oz)-97.4 kg (214 lb 11.7 oz)] 96.7 kg (213 lb 3 oz) (01/30 0857) Weight change:   Intake/Output from previous day: 01/29 0701 - 01/30 0700 In: 642 [P.O.:642] Out: 300 [Urine:300] Intake/Output this shift: No intake/output data recorded.  General appearance: alert, cooperative, no distress and moderately obese Resp: clear to auscultation bilaterally Chest wall: RIJ PC Cardio: S1, S2 normal and systolic murmur: holosystolic 2/6, blowing at apex GI: obese, pos bs, liver down 5 cm Extremities: edema no edema,  and AVF 1st stage LUA  Lab Results:  Recent Labs  05/13/16 0404 05/14/16 0900  WBC 10.5 10.6*  HGB 10.9* 9.7*  HCT 33.8* 29.6*  PLT 311 286   BMET:  Recent Labs  05/13/16 0404 05/14/16 0900  NA 137 135  K 3.5 3.4*  CL 98* 97*  CO2 25 23  GLUCOSE 205* 240*  BUN 50* 67*  CREATININE 5.84* 6.02*  CALCIUM 9.6 9.3   No results for input(s): PTH in the last 72 hours. Iron Studies: No results for input(s): IRON, TIBC, TRANSFERRIN, FERRITIN in the last 72 hours.  Studies/Results: No results found.  I have reviewed the patient's current medications.  Assessment/Plan: 1 ESRD on HD doing well.  2 Anemia esa/Fe 3 DM controlled 4 HTN lower vol cont meds 5 HPTH vit D 6 obesity P HD, esa, vit D, bp meds, to rehab facility    LOS: 12 days   Carnie Bruemmer L 05/14/2016,9:41 AM

## 2016-05-14 NOTE — Progress Notes (Signed)
IV discontinued. VSS. Denies pain or distress. On room air. Belongings at bedside and given to ambulance crew. Pt transported by [facility] ambulance service. Will sign off for now.

## 2016-05-14 NOTE — Progress Notes (Signed)
Patient arrived to unit by bed.  Reviewed treatment plan and this RN agrees with plan.  Report received from bedside RN, Anitra.  Consent verified.  Patient A & O X 4.   Lung sounds diminished to ausculation in all fields. Generalized  edema. Cardiac:  Regular R&R.  Removed caps and cleansed RIJ catheter with chlorhedxidine.  Aspirated ports of heparin and flushed them with saline per protocol.  Connected and secured lines, initiated treatment at 0902.  UF Goal of 4000 mL and net fluid removal 3.5 L.  Will continue to monitor.

## 2016-05-14 NOTE — Care Management Note (Signed)
Case Management Note  Patient Details  Name: Brooke Stafford MRN: [id number] Date of Birth: [birth year]  Subjective/Objective:                 Patient admitted from home. Active with [facility] pta, (was approved for [place] per previous CM notes). Patient HD set up for [place], MWF at 1:00, would need to be there at 11:45 (per previous note of HD coordinator). Plan for DC to SNF, please follow CSW notes.    Action/Plan:  Anticipate DC to SNF as facilitated by CSW.   Expected Discharge Date:  05/14/16               Expected Discharge Plan:  [place]  In-House Referral:  Clinical Social Work  Discharge planning Services     Post Acute Care Choice:  Resumption of Svcs/PTA Provider Choice offered to:     DME Arranged:    DME Agency:     HH Arranged:  RN, PT, Social Work [employer] Agency:  Other - See comment  Status of Service:  Completed, signed off  If discussed at [employer], dates discussed:    Additional Comments:  Carles Collet, RN 05/14/2016, 2:28 PM

## 2016-05-14 NOTE — Consult Note (Signed)
   [facility] CM Inpatient Consult   05/14/2016  Brooke Stafford Aug 21, 1955 [id number]    [facility] Care Management follow up. She is active with [place] Management program. Spoke with [name] at bedside. She endorses she will go to rehab but is not sure which facility she is going to. States she thinks it is [facility]. Made her aware that writer will make referral to [facility] LCSW for follow up while at [facility] She is agreeable to this. She also endorses that she has now begun HD. States she is a little tired from it. Support and encouragement offered. Appreciative of visit. Will attempt to confirm SNF facililty from inpatient staff. Will request to be assigned to [facility] LCSW in the meantime.   [name], MSN-Ed, RN,BSN [facility] Liaison [phone]

## 2016-05-14 NOTE — Clinical Social Work Note (Signed)
Clinical Social Worker facilitated patient discharge including contacting patient family and facility to confirm patient discharge plans.  Clinical information faxed to facility and family agreeable with plan.  CSW arranged ambulance transport via [facility] to [facility].  RN to call report prior to discharge.  Clinical Social Worker will sign off for now as social work intervention is no longer needed. Please consult [place] again if new need arises.  Barbette Or, [place]

## 2016-05-14 NOTE — Progress Notes (Signed)
Report called to [facility]. Ambulance notified of need for transport. "Boyfriend" aware of transfer to facility this pm.

## 2016-05-14 NOTE — Progress Notes (Signed)
Dialysis treatment completed.  2750 mL ultrafiltrated.  2250 mL net fluid removal.  Patient status unchanged. Lung sounds clear to ausculation in all fields. Generalized edema. Cardiac: NSR.  Cleansed RIJ catheter with chlorhexidine.  Disconnected lines and flushed ports with saline per protocol.  Ports locked with heparin and capped per protocol.    Report given to bedside, RN Rodena Piety.   Fluid removal goal not achieved due to abdominal cramping per patient.  At end of treatment it was determined by patient that the cramping was a result of a need to move her bowls.

## 2016-05-14 NOTE — Procedures (Signed)
I was present at this session.  I have reviewed the session itself and made appropriate changes. Cath function ok. tol vol off, follow bp closely . [place] cath  Su Duma L 1/30/20189:41 AM

## 2016-05-14 NOTE — Progress Notes (Signed)
Inpatient Diabetes Program Recommendations  AACE/ADA: New Consensus Statement on Inpatient Glycemic Control (2015)  Target Ranges:  Prepandial:   less than 140 mg/dL      Peak postprandial:   less than 180 mg/dL (1-2 hours)      Critically ill patients:  140 - 180 mg/dL   Results for Brooke Stafford, Brooke Stafford (MRN [id number]) as of 05/14/2016 10:24  Ref. Range 05/13/2016 07:53 05/13/2016 11:20 05/13/2016 16:15 05/13/2016 21:10  Glucose-Capillary Latest Ref Range: 65 - 99 mg/dL 184 (H) 284 (H) 229 (H) 279 (H)   Results for Brooke Stafford, Brooke Stafford (MRN [id number]) as of 05/14/2016 10:24  Ref. Range 05/14/2016 07:45  Glucose-Capillary Latest Ref Range: 65 - 99 mg/dL 197 (H)    Home DM Meds: Toujeo 30 units QHS                             Novolog 10-20 units TID per SSI  Current Insulin Orders: Lantus 25 units QHS                                       Novolog Sensitive Correction Scale/ SSI (0-9 units) TID AC + HS       MD- Please consider the following in-hospital insulin adjustments:  1. Increase Lantus to 30 units QHS (home dose)  2. Start low dose Novolog Meal Coverage: Novolog 4 units TID with meals (hold if pt eats <50% of meal)     --Will follow patient during hospitalization--  Wyn Quaker RN, MSN, CDE Diabetes Coordinator Inpatient Glycemic Control Team Team Pager: [phone] (8a-5p)

## 2016-05-15 ENCOUNTER — Encounter: Payer: Self-pay | Admitting: Licensed Clinical Social Worker

## 2016-05-15 ENCOUNTER — Other Ambulatory Visit: Payer: Self-pay | Admitting: Licensed Clinical Social Worker

## 2016-05-15 NOTE — Patient Outreach (Signed)
[facility]) Care Management  [facility] Social Work  05/15/2016  Brooke Stafford 10/30/1955 [id number]  Encounter Medications:  Outpatient Encounter Prescriptions as of 05/15/2016  Medication Sig Note  . albuterol (PROVENTIL HFA;VENTOLIN HFA) 108 (90 Base) MCG/ACT inhaler Inhale 2 puffs into the lungs every 6 (six) hours as needed for wheezing or shortness of breath.   [name] albuterol (PROVENTIL) (2.5 MG/3ML) 0.083% nebulizer solution Take 3 mLs (2.5 mg total) by nebulization every 6 (six) hours as needed for wheezing or shortness of breath.   . allopurinol (ZYLOPRIM) 300 MG tablet Take 1 tablet (300 mg total) by mouth daily. (Patient taking differently: Take 300 mg by mouth every evening. )   . amLODipine (NORVASC) 10 MG tablet take 1 tablet by mouth once daily (Patient taking differently: Take 10 mg by mouth daily)   . aspirin 81 MG chewable tablet Chew 1 tablet (81 mg total) by mouth daily.   [name] atorvastatin (LIPITOR) 40 MG tablet Take 1 tablet (40 mg total) by mouth daily.   . B-D UF III MINI PEN NEEDLES 31G X 5 MM MISC USE TO INJECT INSULIN 4 TIMES A DAY AS INSTRUCTED   . benzonatate (TESSALON) 100 MG capsule Take 100 mg by mouth 3 (three) times daily as needed for cough.   . calcitRIOL (ROCALTROL) 0.25 MCG capsule Take 0.25 mcg by mouth daily.   . cloNIDine (CATAPRES) 0.2 MG tablet Take 1 tablet (0.2 mg total) by mouth 3 (three) times daily.   . clopidogrel (PLAVIX) 75 MG tablet Take 1 tablet (75 mg total) by mouth daily.   Derrill Memo ON 05/21/2016] Darbepoetin Alfa (ARANESP) 200 MCG/0.4ML SOSY injection Inject 0.4 mLs (200 mcg total) into the vein every Tuesday with hemodialysis.   [name] ezetimibe (ZETIA) 10 MG tablet Take 1 tablet (10 mg total) by mouth daily.   Derrill Memo ON 05/16/2016] ferric gluconate 125 mg in sodium chloride 0.9 % 100 mL Inject 125 mg into the vein Every Tuesday,Thursday,and Saturday with dialysis.   . fluticasone (FLONASE) 50 MCG/ACT nasal spray Place 2 sprays into  both nostrils daily as needed for allergies or rhinitis.   . Fluticasone-Salmeterol (ADVAIR DISKUS) 250-50 MCG/DOSE AEPB Inhale 1 puff into the lungs 2 (two) times daily.   . hydrOXYzine (ATARAX/VISTARIL) 25 MG tablet Take 1 tablet (25 mg total) by mouth 3 (three) times daily. 04/02/2016: Pt states she is taking on schedule in the place of her clonazepam bc it was too strong   . insulin aspart (NOVOLOG FLEXPEN) 100 UNIT/ML FlexPen Inject 10-30 units into the skin 3 times daily, plus sliding scale as instructed. (Patient taking differently: Inject 10-20 Units into the skin 3 (three) times daily with meals. Inject 10-30 units into the skin 3 times daily, plus sliding scale as instructed.) 02/21/2016: 10-20 units  . Insulin Glargine (TOUJEO SOLOSTAR) 300 UNIT/ML SOPN Inject 30 Units into the skin at bedtime.   [name] levothyroxine (SYNTHROID, LEVOTHROID) 25 MCG tablet take 1 tablet by mouth every morning BEFORE BREAKFAST (Patient taking differently: TAKE 25 MCG BY MOUTH EACH MORNING)   . metoprolol tartrate (LOPRESSOR) 25 MG tablet Take 1 tablet (25 mg total) by mouth 2 (two) times daily.   . Naphazoline HCl (CLEAR EYES OP) Place 1 drop into both eyes as needed (for dry eyes).   . nitroGLYCERIN (NITROSTAT) 0.4 MG SL tablet Place 1 tablet (0.4 mg total) under the tongue every 5 (five) minutes as needed.   . prednisoLONE acetate (PRED FORTE) 1 % ophthalmic  suspension Place 1 drop into both eyes 3 (three) times daily.   . ranitidine (ZANTAC 75) 75 MG tablet Take 1 tablet (75 mg total) by mouth 2 (two) times daily. (Patient taking differently: Take 75 mg by mouth 2 (two) times daily as needed for heartburn. )   . senna-docusate (SENOKOT-S) 8.6-50 MG tablet Take 1 tablet by mouth at bedtime as needed for mild constipation.   . sevelamer carbonate (RENVELA) 2.4 g PACK Take 2.4 g by mouth 3 (three) times daily with meals.   . triamcinolone ointment (KENALOG) 0.5 % Apply 1 application topically 2 (two) times daily.  (Patient taking differently: Apply 1 application topically daily as needed (for skin). )    No facility-administered encounter medications on file as of 05/15/2016.     Functional Status:  In your present state of health, do you have any difficulty performing the following activities: 05/15/2016 05/04/2016  Hearing? N N  Vision? - N  Difficulty concentrating or making decisions? - N  Walking or climbing stairs? - Y  Dressing or bathing? - N  Doing errands, shopping? - Facilities manager and eating ? - -  Using the Toilet? - -  In the past six months, have you accidently leaked urine? - -  Do you have problems with loss of bowel control? - -  Managing your Medications? - -  Managing your Finances? - -  Housekeeping or managing your Housekeeping? - -  Some recent data might be hidden    Fall/Depression Screening:  PHQ 2/9 Scores 05/15/2016 04/26/2016 04/03/2016 03/11/2016 02/23/2016 01/11/2016 01/04/2016  PHQ - 2 Score 2 2 0 2 6 1 3   PHQ- 9 Score 8 9 - 6 27 - 12    Assessment: CSW received new referral on patient yesterday and completed SNF visit today. CSW arrived at [facility] and completed visit with patient and significant other. CSW has previously worked with patient in the past. Patient has had several recent hospitalizations. Patient is now on dialysis. Patient reports to be in some pain in her abdomen, head and fingers. Patient has been trying to relocate to a different residence for some time now. Patient reports that she has Section 8 and uses it for her residence now. CSW questioned if she was on the wait list to relocate for Section 8 and she did not know. CSW asked for caseworker's number and patient provided CSW with Home Health Nurse's number. CSW completed call to [employer] and left a message with [name] at [phone]. CSW will await to hear back from caseworker. Patient's spouse arrived and stated that patient DID NOT have Section 8 but is on the wait list for  services. He also stated that they do not use Section 8 for their current home. He states that he is fine residing at their current residence because he does not think there is mold. Patient reports that the mold in her home has caused her to have several health issues. Spouse dismisses this statement. He reports that patient does not like the are that they live in and that is why she wishes to relocate. He states that he pays the full rent which is $600 per month and she helps to pay the utilities and groceries. He shares that patient is wanting to relocate but he is not. She states that she has used [employer] that CSW previously provided to her but was not able to find a new residence. She reports that she did go to  [employer] and was provided a list of houses to choose from but that she did not like any of them or the areas. CSW reviewed all housing resources again. CSW advised patient that she can find a house that is not on the list that [employer] provided and as long as the home that she likes fits the program's requirements then she can still receive financial assistance. Patient appreciative of this assistance and resource review. CSW will await to hear back from caseworker to see if patient has Section 8, on the wait list to relocate or on the wait list for Section 8 because patient and spouse have conflicting statements in regards to this.   Patient has a very flat affect today. She admits to experiencing some depressive symptoms but states "I don't feel good at all today." She reports that she is in a lot of pain and that she is experiencing cramps in her stomach and legs after she received dialysis yesterday. CSW provided update to patient's nurse. Patient continues to deal with grief from losing her son. She states that she was receiving counseling at [facility] and that her therapist's name was [facility]. However, she has not had a chance to schedule another  appointment due to her health problems. CSW encouraged patient to strongly consider going back to therapy after she discharges back home. Patient agreeable to this request. CSW provided education on antidepressants. Patient reports that she will discuss her depressive symptoms and the possibility of start antidepressants during her next PCP appointment. CSW provided a list of mental health resources and educated her on each one. Patient denies experiencing any suicidal or homicidal ideations. Patient reports that she gains socialization by going to church, going out to eat and talking to her best friend.  Patient has stable transportation through [place]. Patient denies wishing to complete an advance directives today.  CSW educated patient on LTC placement. Patient states "I don't think I need that right now but maybe down the road." She prefers to discharge back home instead of LTC.   SNF d/c planner Lupita Dawn was not at facility today. CSW completed call to her but her mailbox was full. CSW was unable to leave a voice message.   [facility] CM Care Plan Problem One   Flowsheet Row Most Recent Value  Care Plan Problem One  SNF admission and lack of resources  Role Documenting the Problem One  Clinical Social Worker  Care Plan for Problem One  Active  [facility] Long Term Goal (31-90 days)  Patient will have a safe and stable discharge back home from SNF within 90 days per patient report  [place] Term Goal Start Date  05/15/16  Interventions for Problem One Long Term Goal  CSW completed SNF visit with family and provided resource education. CSW will coordinate care with patient, family and SNF d/c planner to ensure that she has all resources, services and medical equipment that she needs post discharge.  [facility] CM Short Term Goal #1 (0-30 days)  Patient will gain mental health resource information within 30 days and will be able to read back three resources per patient report during outreach  [facility] CM Short Term Goal #1  Start Date  05/15/16  Interventions for Short Term Goal #1  CSW has provided handout and reviewed each mental health resource. CSW will continue to educate and encourage patient to implement resources. CSW will complete additional review of resources as needed.     Plan: CSW will route  encounter to PCP and [facility] Liaison who is familiar with case. CSW will send involvement letter to PCP. CSW will follow up with facility within one week.  [name], BSW, MSW, [email or web address] Phone: [phone] Fax: [phone]

## 2016-05-16 ENCOUNTER — Encounter (HOSPITAL_COMMUNITY): Payer: Medicare Other

## 2016-05-16 ENCOUNTER — Non-Acute Institutional Stay (SKILLED_NURSING_FACILITY): Payer: Medicare Other | Admitting: Adult Health

## 2016-05-16 DIAGNOSIS — Z794 Long term (current) use of insulin: Secondary | ICD-10-CM

## 2016-05-16 DIAGNOSIS — Z951 Presence of aortocoronary bypass graft: Secondary | ICD-10-CM

## 2016-05-16 DIAGNOSIS — E0851 Diabetes mellitus due to underlying condition with diabetic peripheral angiopathy without gangrene: Secondary | ICD-10-CM

## 2016-05-16 DIAGNOSIS — E1140 Type 2 diabetes mellitus with diabetic neuropathy, unspecified: Secondary | ICD-10-CM | POA: Diagnosis not present

## 2016-05-16 DIAGNOSIS — E785 Hyperlipidemia, unspecified: Secondary | ICD-10-CM

## 2016-05-16 DIAGNOSIS — E039 Hypothyroidism, unspecified: Secondary | ICD-10-CM | POA: Diagnosis not present

## 2016-05-16 DIAGNOSIS — I5043 Acute on chronic combined systolic (congestive) and diastolic (congestive) heart failure: Secondary | ICD-10-CM | POA: Diagnosis not present

## 2016-05-16 DIAGNOSIS — Z9861 Coronary angioplasty status: Secondary | ICD-10-CM | POA: Diagnosis not present

## 2016-05-16 DIAGNOSIS — I132 Hypertensive heart and chronic kidney disease with heart failure and with stage 5 chronic kidney disease, or end stage renal disease: Secondary | ICD-10-CM

## 2016-05-16 DIAGNOSIS — N185 Chronic kidney disease, stage 5: Secondary | ICD-10-CM

## 2016-05-16 DIAGNOSIS — I2510 Atherosclerotic heart disease of native coronary artery without angina pectoris: Secondary | ICD-10-CM

## 2016-05-16 DIAGNOSIS — I770 Arteriovenous fistula, acquired: Secondary | ICD-10-CM | POA: Diagnosis not present

## 2016-05-16 DIAGNOSIS — N186 End stage renal disease: Secondary | ICD-10-CM

## 2016-05-16 DIAGNOSIS — J42 Unspecified chronic bronchitis: Secondary | ICD-10-CM | POA: Diagnosis not present

## 2016-05-16 DIAGNOSIS — I509 Heart failure, unspecified: Secondary | ICD-10-CM

## 2016-05-17 DIAGNOSIS — Z23 Encounter for immunization: Secondary | ICD-10-CM | POA: Diagnosis not present

## 2016-05-17 DIAGNOSIS — D509 Iron deficiency anemia, unspecified: Secondary | ICD-10-CM | POA: Diagnosis not present

## 2016-05-17 DIAGNOSIS — N2581 Secondary hyperparathyroidism of renal origin: Secondary | ICD-10-CM | POA: Diagnosis not present

## 2016-05-17 DIAGNOSIS — N186 End stage renal disease: Secondary | ICD-10-CM | POA: Diagnosis not present

## 2016-05-17 DIAGNOSIS — E876 Hypokalemia: Secondary | ICD-10-CM | POA: Diagnosis not present

## 2016-05-17 DIAGNOSIS — E1129 Type 2 diabetes mellitus with other diabetic kidney complication: Secondary | ICD-10-CM | POA: Diagnosis not present

## 2016-05-20 ENCOUNTER — Other Ambulatory Visit: Payer: Self-pay | Admitting: Licensed Clinical Social Worker

## 2016-05-20 DIAGNOSIS — N186 End stage renal disease: Secondary | ICD-10-CM | POA: Diagnosis not present

## 2016-05-20 DIAGNOSIS — E1129 Type 2 diabetes mellitus with other diabetic kidney complication: Secondary | ICD-10-CM | POA: Diagnosis not present

## 2016-05-20 DIAGNOSIS — D509 Iron deficiency anemia, unspecified: Secondary | ICD-10-CM | POA: Diagnosis not present

## 2016-05-20 DIAGNOSIS — N2581 Secondary hyperparathyroidism of renal origin: Secondary | ICD-10-CM | POA: Diagnosis not present

## 2016-05-20 DIAGNOSIS — Z23 Encounter for immunization: Secondary | ICD-10-CM | POA: Diagnosis not present

## 2016-05-20 DIAGNOSIS — E876 Hypokalemia: Secondary | ICD-10-CM | POA: Diagnosis not present

## 2016-05-20 NOTE — Patient Outreach (Signed)
[facility]) Care Management  05/20/2016  Brooke Stafford 21-Sep-1955 [id number]  Assessment- CSW has not heard back from [name], caseworker at [employer] in regards to whether or not patient is on the wait list for Section 8. CSW completed an additional outreach to her and left another voice message encouraging return call. CSW also completed call to patient but was unable to reach her. CSW left a HIPPA compliant voice message encouraging return call.  Plan-CSW will complete outreach to SNF within one week to follow up on discharge planning.  [name], BSW, MSW, [email or web address] Phone: [phone] Fax: [phone]

## 2016-05-21 ENCOUNTER — Encounter: Payer: Self-pay | Admitting: Internal Medicine

## 2016-05-21 ENCOUNTER — Non-Acute Institutional Stay (SKILLED_NURSING_FACILITY): Payer: Medicare Other | Admitting: Internal Medicine

## 2016-05-21 ENCOUNTER — Other Ambulatory Visit: Payer: Self-pay | Admitting: Licensed Clinical Social Worker

## 2016-05-21 DIAGNOSIS — J42 Unspecified chronic bronchitis: Secondary | ICD-10-CM | POA: Diagnosis not present

## 2016-05-21 DIAGNOSIS — N186 End stage renal disease: Secondary | ICD-10-CM

## 2016-05-21 DIAGNOSIS — E039 Hypothyroidism, unspecified: Secondary | ICD-10-CM | POA: Diagnosis not present

## 2016-05-21 DIAGNOSIS — E782 Mixed hyperlipidemia: Secondary | ICD-10-CM | POA: Diagnosis not present

## 2016-05-21 DIAGNOSIS — E1140 Type 2 diabetes mellitus with diabetic neuropathy, unspecified: Secondary | ICD-10-CM | POA: Diagnosis not present

## 2016-05-21 DIAGNOSIS — I5042 Chronic combined systolic (congestive) and diastolic (congestive) heart failure: Secondary | ICD-10-CM | POA: Diagnosis not present

## 2016-05-21 DIAGNOSIS — F418 Other specified anxiety disorders: Secondary | ICD-10-CM | POA: Diagnosis not present

## 2016-05-21 DIAGNOSIS — I10 Essential (primary) hypertension: Secondary | ICD-10-CM

## 2016-05-21 DIAGNOSIS — M109 Gout, unspecified: Secondary | ICD-10-CM | POA: Diagnosis not present

## 2016-05-21 DIAGNOSIS — D638 Anemia in other chronic diseases classified elsewhere: Secondary | ICD-10-CM | POA: Diagnosis not present

## 2016-05-21 DIAGNOSIS — J3089 Other allergic rhinitis: Secondary | ICD-10-CM

## 2016-05-21 DIAGNOSIS — Z794 Long term (current) use of insulin: Secondary | ICD-10-CM

## 2016-05-21 NOTE — Patient Outreach (Signed)
[facility]) Care Management  05/21/2016  Brooke Stafford 1955-12-05 [id number]   Assessment- CSW received return call from Brooke Stafford with [employer]. She reports that she does not have patient in her records and that she cannot tell if patient is on the wait list or not. She reports that patient must call the front desk at [phone] to inquire about her status. She states that if patient was on the wait list then she would have received something in the mail. CSW completed call back to [facility] authority as was transferred to [employer] at [phone]. CSW left a HIPPA compliant voice message requesting return call.  Plan-CSW will await for return call and continue to provide social work assistance and support to patient.  [name], BSW, MSW, [email or web address] Phone: [phone] Fax: [phone]

## 2016-05-21 NOTE — Progress Notes (Signed)
Patient ID: Brooke Stafford, female   DOB: May 04, 1955, 61 y.o.   MRN: [id number]    HISTORY AND PHYSICAL   DATE: 05/21/2016  Location:    [facility] Room Number: 160 A Place of Service: SNF (31)   Extended Emergency Contact Information Primary Emergency Contact: [place], [place] [address] [place] Phone: [phone] Relation: Sister Secondary Emergency Contact: Netty [place] Mobile Phone: [phone] Relation: Significant other  Advanced Directive information Does Patient Have a Medical Advance Directive?: No, Would patient like information on creating a medical advance directive?: No - Patient declined  Chief Complaint  Patient presents with  . New Admit To SNF    HPI:  61 yo female seen today as a new admission into SNF following hospital stay for acute hypoxic respiratory failure 2/2 pneumonia and acute/chronic HF, COPD, A/CKD stage 5, CAD s/p CABG and PCI, HTN, DM, AOCD. She presented to the ED from home with worsening cough and SOB. In the ED she had a fever and O2 sats in 60s% and she was placed on Bipap. She was given IV abx and aggressive diuresis but began to have increased WOB -->intubated. She self extubated. She was started on HD via temporary cath. Perm-a-cath right IJ and left antecubital fossa AVF placed by vascular sx Dr Donnetta Hutching on 05/10/16. 2D echo revealed EF 35-40% with grade 2 DD/moderate ME/mod dilated LA. CBGs 190-360s. At d/c, Hgb 11.9-->7.5-->9.7; A1c 5.7%; WBC 7.9-K->12.7K-->10.6K; iron 25; PTH 542; K 3.4; Cr 6.02; albumin 2.4-->3. She presents to SNF for short term rehab.  Today she reports c/a leg and UE cramps since beginning HD. She would like to know exact days she will go to HD and will discuss further with dialysis center. She told nursing that she has had increased depression. She takes atarax for anxiety as she could not tolerate klonopin. Her significant other is present.  No falls. Appetite ok and sleeps well.  Chronic diastolic HF/CAD with hx CABG x 6 vessels in 2002 and PCI with DES in 2011 and 2015 - EF reduced 35-40%. She takes statin, BB, ASA, plavix. Followed by cardio  HTN - stable on amlodipine, lopressor, catapres. Takes ASA daily  Asthma/COPD/seasonal allergy - stable on albuterol HFA/nebs, advair. Uses flonase nasal spray.  Anemia of chronic disease - stable. Ferritin 95; iron 25; Hgb 9.7. She gets aranesp injections. Takes ferric gluconate. Followed by nephrology  DM - stable. A1c 5.7%. Insulin dependent with toujeo, novolog.   Hx anxiety - she reports feeling depressed. Takes atarax  GERD - stable on zantac. She takes senna for constipation  PVD/PAD - stable on ASA/plavix  Hypothyroidism - stable on levothyroxine. TSH 3.22  Hyperlipidemia - stable on lipitor and zetia. LDL 59  Gout/joint pain - no recent gout attacks. She takes allopurinol  ESRD/HD - new start HD. She takes aranesp, rocaltrol and renvela  Past Medical History:  Diagnosis Date  . Anemia   . Anginal pain ([place])   . Anxiety   . Arthritis    "stiff fingers and knees" (08/04/2013), (12/12/2014)  . Asthma   . CAD (coronary artery disease) 2002; 2015   CABG x 6 2002, cath 2011- med Rx stent DES VG-Diag  . CAD (coronary artery disease) of artery bypass graft; DES to VG-Diag 09/28/13 11/09/2013  . Cataract   . CHF (congestive heart failure) ([place])    "in 2002" (11/26/2012)  . Chronic bronchitis (  [place])    "q year; in the winter"   . CKD (chronic kidney disease)    stage 4, followed by [facility]  . Coronary artery disease 2002   CABG x 6. Cath 5/11- med Rx  . GERD (gastroesophageal reflux disease)   . Gout    "right big toe"  . Headache    "~ q week" (08/04/2013); "~ twice/month" (12/12/2014)  . History of blood transfusion 2002   "when I had OHS"  . Hyperlipidemia   . Hypertension   . Hypothyroid    treated  . Migraines    "couple times/year" (08/04/2013),  (12/12/2014)  . Myocardial infarction 2000; 2002; 2011  . Obesity (BMI 35.0-39.9 without comorbidity)   . Peripheral vascular disease ([place]) 12/12   LSFA PTA  . Pneumonia    "3 times I think" (12/12/2014)  . Type II diabetes mellitus ([place])     Past Surgical History:  Procedure Laterality Date  . ABDOMINAL AORTAGRAM N/A 04/05/2011   Procedure: ABDOMINAL AORTAGRAM;  Surgeon: Lorretta Harp, MD;  Location: [facility] CATH LAB;  Service: Cardiovascular;  Laterality: N/A;  . APPENDECTOMY  1980  . AV FISTULA PLACEMENT Left 05/10/2016   Procedure: LEFT ARM ARTERIOVENOUS (AV) FISTULA CREATION;  Surgeon: Rosetta Posner, MD;  Location: [facility];  Service: Vascular;  Laterality: Left;  . BREAST CYST EXCISION Right 1970's  . CARDIAC CATHETERIZATION  2002  . CARDIAC CATHETERIZATION N/A 12/12/2014   Procedure: Left Heart Cath and Cors/Grafts Angiography;  Surgeon: Peter M Martinique, MD;  Location: [facility] CV LAB;  Service: Cardiovascular;  Laterality: N/A;  . CARDIAC CATHETERIZATION N/A 12/12/2014   Procedure: Coronary Stent Intervention;  Surgeon: Peter M Martinique, MD;  Location: [facility] CV LAB;  Service: Cardiovascular;  Laterality: N/A;  . [place]; 1980  . CHOLECYSTECTOMY  1982  . CORONARY ANGIOPLASTY WITH STENT PLACEMENT  2004; 2012   "I have 2 stents" (08/04/2013)  . CORONARY ANGIOPLASTY WITH STENT PLACEMENT  09/28/13   PTCA/ DES Xience stent to VG-Diag   . CORONARY ARTERY BYPASS GRAFT  11/20/2000   x6 LIMA to distal LAD, svg to first diag, svg to ramus intermediate branch and swquential SVG to cir marginal branch, SVG to posterior descending coronary and sequential SVG to first right posterolateral branch  . INSERTION OF DIALYSIS CATHETER Right 05/10/2016   Procedure: INSERTION OF DIALYSIS CATHETER - Right Internal Jugular Placement;  Surgeon: Rosetta Posner, MD;  Location: [facility];  Service: Vascular;  Laterality: Right;  . LEFT HEART CATHETERIZATION WITH CORONARY/GRAFT ANGIOGRAM N/A 09/28/2013    Procedure: LEFT HEART CATHETERIZATION WITH Beatrix Fetters;  Surgeon: Peter M Martinique, MD;  Location: [facility] CATH LAB;  Service: Cardiovascular;  Laterality: N/A;  . LOWER EXTREMITY ANGIOGRAM  12/01/2012   Procedure: LOWER EXTREMITY ANGIOGRAM;  Surgeon: Lorretta Harp, MD;  Location: [facility] CATH LAB;  Service: Cardiovascular;;  . NM MYOCAR PERF WALL MOTION  08/27/2004   negative  . PERCUTANEOUS STENT INTERVENTION Left 12/01/2012   Procedure: PERCUTANEOUS STENT INTERVENTION;  Surgeon: Lorretta Harp, MD;  Location: [facility] CATH LAB;  Service: Cardiovascular;  Laterality: Left;  Left SFA  . PERIPHERAL ARTERIAL STENT GRAFT Left    SFA/notes 04/07/2011 (11/30/2012)  . RENAL ANGIOGRAM N/A 04/05/2011   Procedure: RENAL ANGIOGRAM;  Surgeon: Lorretta Harp, MD;  Location: [facility] CATH LAB;  Service: Cardiovascular;  Laterality: N/A;  . [place]    Patient Care Team: Tobi Bastos, RN as [place]  Glendon Joyce, LCSW as [employer] [employer] (Licensed [employer] representative)  Social History   Social History  . Marital status: Divorced    Spouse name: N/A  . Number of children: 2  . Years of education: 14   Occupational History  . Disabled    Social History Main Topics  . Smoking status: Former Smoker    Packs/day: 0.00    Years: 25.00    Types: Cigarettes    Quit date: 04/04/2000  . Smokeless tobacco: Never Used  . Alcohol use No     Comment: 12/12/2014  "have a glass of red wine on my birthday q yr; that's it"  . Drug use: No  . Sexual activity: Not Currently    Birth control/ protection: Abstinence   Other Topics Concern  . Not on file   Social History Narrative   Lives at home with a roommate.   Right-handed.   Occasional caffeine use.   Her 48 year son was shot to death in 2015-08-11.     reports that she quit smoking about 16 years ago. Her smoking use included Cigarettes. She smoked 0.00 packs per day for 25.00  years. She has never used smokeless tobacco. She reports that she does not drink alcohol or use drugs.  Family History  Problem Relation Age of Onset  . Diabetes Mother   . Hypertension Mother   . Stroke Mother   . Hypertension Father   . Hypertension Brother   . Hypertension Sister   . Diabetes Sister   . Hyperlipidemia Sister    Family Status  Relation Status  . Mother Deceased  . Father Deceased  . Sister Alive  . Brother Alive  . Maternal Grandmother Deceased  . Maternal Grandfather Deceased  . Paternal Grandmother Deceased  . Paternal Grandfather Deceased  . Brother   . Sister   . Sister   . Sister     Immunization History  Administered Date(s) Administered  . Influenza,inj,Quad PF,36+ Mos 01/04/2014, 12/13/2014, 11/30/2015  . Pneumococcal Polysaccharide-23 12/18/2015  . Pneumococcal-Unspecified 12/14/2009  . Tdap 01/11/2016    Allergies  Allergen Reactions  . Digoxin And Related Other (See Comments)    Patient stated she almost died. Had flu like symptoms as well as diarrhea.  . Hydralazine Shortness Of Breath  . Penicillins Cross Reactors Hives and Other (See Comments)    HIGH FEVER  Has patient had a PCN reaction causing immediate rash, facial/tongue/throat swelling, SOB or lightheadedness with hypotension: No Has patient had a PCN reaction causing SEVERE RASH INVOLVING MUCUS MEMBRANES or SKIN NECROSIS  #  #  #  YES  #  #  #  Has patient had a PCN reaction that required hospitalization: Unk Has patient had a PCN reaction occurring within the last 10 years: Unk If all of the above answers are "NO", then may proceed with Cephalosporin use.   [name] Lisinopril Other (See Comments)    Felt like she had the flu. Was very sick!!!  . Adhesive [Tape] Rash    bruising    Medications: Patient's Medications  New Prescriptions   No medications on file  Previous Medications   ALBUTEROL (PROVENTIL HFA;VENTOLIN HFA) 108 (90 BASE) MCG/ACT INHALER    Inhale 2 puffs  into the lungs every 6 (six) hours as needed for wheezing or shortness of breath.   ALBUTEROL (PROVENTIL) (2.5 MG/3ML) 0.083% NEBULIZER SOLUTION    Take 3 mLs (2.5 mg total) by nebulization  every 6 (six) hours as needed for wheezing or shortness of breath.   ALLOPURINOL (ZYLOPRIM) 300 MG TABLET    Take 1 tablet (300 mg total) by mouth daily.   AMLODIPINE (NORVASC) 10 MG TABLET    take 1 tablet by mouth once daily   ASPIRIN 81 MG CHEWABLE TABLET    Chew 1 tablet (81 mg total) by mouth daily.   ATORVASTATIN (LIPITOR) 40 MG TABLET    Take 1 tablet (40 mg total) by mouth daily.   BENZONATATE (TESSALON) 100 MG CAPSULE    Take 100 mg by mouth 3 (three) times daily as needed for cough.   CALCITRIOL (ROCALTROL) 0.25 MCG CAPSULE    Take 0.25 mcg by mouth daily.   CLONIDINE (CATAPRES) 0.2 MG TABLET    Take 1 tablet (0.2 mg total) by mouth 3 (three) times daily.   CLOPIDOGREL (PLAVIX) 75 MG TABLET    Take 1 tablet (75 mg total) by mouth daily.   DARBEPOETIN ALFA (ARANESP) 200 MCG/0.4ML SOSY INJECTION    Inject 0.4 mLs (200 mcg total) into the vein every Tuesday with hemodialysis.   EZETIMIBE (ZETIA) 10 MG TABLET    Take 1 tablet (10 mg total) by mouth daily.   FERRIC GLUCONATE 125 MG IN SODIUM CHLORIDE 0.9 % 100 ML    Inject 125 mg into the vein Every Tuesday,Thursday,and Saturday with dialysis.   FLUTICASONE (FLONASE) 50 MCG/ACT NASAL SPRAY    Place 2 sprays into both nostrils daily as needed for allergies or rhinitis.   FLUTICASONE-SALMETEROL (ADVAIR DISKUS) 250-50 MCG/DOSE AEPB    Inhale 1 puff into the lungs 2 (two) times daily.   HYDROXYZINE (ATARAX/VISTARIL) 25 MG TABLET    Take 1 tablet (25 mg total) by mouth 3 (three) times daily.   INSULIN ASPART (NOVOLOG) 100 UNIT/ML INJECTION    Inject into the skin 3 (three) times daily before meals. Inject as per sliding scale: 0-150=0 units, 151+=5 units   INSULIN GLARGINE (TOUJEO SOLOSTAR) 300 UNIT/ML SOPN    Inject 30 Units into the skin at bedtime.    LEVOTHYROXINE (SYNTHROID, LEVOTHROID) 25 MCG TABLET    take 1 tablet by mouth every morning BEFORE BREAKFAST   METOPROLOL TARTRATE (LOPRESSOR) 25 MG TABLET    Take 1 tablet (25 mg total) by mouth 2 (two) times daily.   NAPHAZOLINE HCL (CLEAR EYES OP)    Place 1 drop into both eyes as needed (for dry eyes).   NITROGLYCERIN (NITROSTAT) 0.4 MG SL TABLET    Place 1 tablet (0.4 mg total) under the tongue every 5 (five) minutes as needed.   PREDNISOLONE ACETATE (PRED FORTE) 1 % OPHTHALMIC SUSPENSION    Place 1 drop into both eyes 3 (three) times daily.   RANITIDINE (ZANTAC 75) 75 MG TABLET    Take 1 tablet (75 mg total) by mouth 2 (two) times daily.   SENNA-DOCUSATE (SENOKOT-S) 8.6-50 MG TABLET    Take 1 tablet by mouth at bedtime as needed for mild constipation.   SEVELAMER CARBONATE (RENVELA) 2.4 G PACK    Take 2.4 g by mouth 3 (three) times daily with meals.   TRIAMCINOLONE OINTMENT (KENALOG) 0.5 %    Apply 1 application topically 2 (two) times daily.  Modified Medications   No medications on file  Discontinued Medications   B-D UF III MINI PEN NEEDLES 31G X 5 MM MISC    USE TO INJECT INSULIN 4 TIMES A DAY AS INSTRUCTED   INSULIN ASPART (NOVOLOG FLEXPEN) 100 UNIT/ML FLEXPEN  Inject 10-30 units into the skin 3 times daily, plus sliding scale as instructed.    Review of Systems  Vitals:   05/21/16 0906  BP: (!) 161/67  Pulse: 96  Resp: 20  Temp: 98.8 F (37.1 C)  TempSrc: Oral  SpO2: 96%  Weight: 210 lb 12.8 oz (95.6 kg)  Height: _0  (1.778 m)   Body mass index is 30.25 kg/m.  Physical Exam  Constitutional: She is oriented to person, place, and time. She appears well-developed and well-nourished.  HENT:  Mouth/Throat: Oropharynx is clear and moist. No oropharyngeal exudate.  No oral thrush; MMM  Eyes: Pupils are equal, round, and reactive to light. No scleral icterus.  Neck: Neck supple. Carotid bruit is not present. No tracheal deviation present.  Cardiovascular: Normal rate,  regular rhythm and intact distal pulses.  Exam reveals no gallop and no friction rub.   Murmur (1/6 SEM) heard. Left antecubital fossa AVF with palpable thrill and audible bruit; no LE edema b/l. No calf TTP  Pulmonary/Chest: Effort normal and breath sounds normal. No stridor. No respiratory distress. She has no wheezes. She has no rales.  Right ACW permacath present with dsg c/d/i. No secondary signs of infection  Abdominal: Soft. Bowel sounds are normal. She exhibits distension. She exhibits no mass. There is no hepatomegaly. There is no tenderness. There is no rebound and no guarding.  Musculoskeletal: She exhibits edema and tenderness.  Lymphadenopathy:    She has no cervical adenopathy.  Neurological: She is alert and oriented to person, place, and time.  Skin: Skin is warm and dry. No rash noted.  Psychiatric: She has a normal mood and affect. Her behavior is normal. Judgment and thought content normal.     Labs reviewed: Admission on 05/02/2016, Discharged on 05/14/2016  No results displayed because visit has over 200 results.  CBC Latest Ref Rng & Units 05/14/2016 05/13/2016 05/12/2016  WBC 4.0 - 10.5 K/uL 10.6(H) 10.5 12.7(H)  Hemoglobin 12.0 - 15.0 g/dL 9.7(L) 10.9(L) 10.8(L)  Hematocrit 36.0 - 46.0 % 29.6(L) 33.8(L) 33.6(L)  Platelets 150 - 400 K/uL 286 311 288   CMP Latest Ref Rng & Units 05/14/2016 05/13/2016 05/12/2016  Glucose 65 - 99 mg/dL 240(H) 205(H) 205(H)  BUN 6 - 20 mg/dL 67(H) 50(H) 34(H)  Creatinine 0.44 - 1.00 mg/dL 6.02(H) 5.84(H) 4.37(H)  Sodium 135 - 145 mmol/L 135 137 136  Potassium 3.5 - 5.1 mmol/L 3.4(L) 3.5 3.9  Chloride 101 - 111 mmol/L 97(L) 98(L) 99(L)  CO2 22 - 32 mmol/L _1 Calcium 8.9 - 10.3 mg/dL 9.3 9.6 9.6  Total Protein 6.5 - 8.1 g/dL - - -  Total Bilirubin 0.3 - 1.2 mg/dL - - -  Alkaline Phos 38 - 126 U/L - - -  AST 15 - 41 U/L - - -  ALT 14 - 54 U/L - - -   Lipid Panel     Component Value Date/Time   CHOL 142 07/25/2015 2037    CHOL 144 04/20/2015 1036   TRIG 163 (H) 05/07/2016 0450   HDL 39 (L) 07/25/2015 2037   HDL 51 04/20/2015 1036   CHOLHDL 3.6 07/25/2015 2037   VLDL 44 (H) 07/25/2015 2037   LDLCALC 59 07/25/2015 2037   LDLCALC 63 04/20/2015 1036   Lab Results  Component Value Date   HGBA1C 5.7 (H) 05/03/2016     Hospital Outpatient Visit on 04/22/2016  Component Date Value Ref Range Status  . FVC-Pre 04/22/2016 1.73  L Final  .  FVC-%Pred-Pre 04/22/2016 51  % Final  . FVC-Post 04/22/2016 2.23  L Final  . FVC-%Pred-Post 04/22/2016 65  % Final  . FVC-%Change-Post 04/22/2016 28  % Final  . FEV1-Pre 04/22/2016 1.35  L Final  . FEV1-%Pred-Pre 04/22/2016 50  % Final  . FEV1-Post 04/22/2016 1.74  L Final  . FEV1-%Pred-Post 04/22/2016 65  % Final  . FEV1-%Change-Post 04/22/2016 29  % Final  . FEV6-Pre 04/22/2016 1.67  L Final  . FEV6-%Pred-Pre 04/22/2016 50  % Final  . FEV6-Post 04/22/2016 2.15  L Final  . FEV6-%Pred-Post 04/22/2016 65  % Final  . FEV6-%Change-Post 04/22/2016 28  % Final  . Pre FEV1/FVC ratio 04/22/2016 78  % Final  . FEV1FVC-%Pred-Pre 04/22/2016 98  % Final  . Post FEV1/FVC ratio 04/22/2016 78  % Final  . FEV1FVC-%Change-Post 04/22/2016 0  % Final  . Pre FEV6/FVC Ratio 04/22/2016 98  % Final  . FEV6FVC-%Pred-Pre 04/22/2016 101  % Final  . Post FEV6/FVC ratio 04/22/2016 98  % Final  . FEV6FVC-%Pred-Post 04/22/2016 101  % Final  . FEV6FVC-%Change-Post 04/22/2016 0  % Final  . FEF 25-75 Pre 04/22/2016 1.39  L/sec Final  . FEF2575-%Pred-Pre 04/22/2016 55  % Final  . FEF 25-75 Post 04/22/2016 2.73  L/sec Final  . FEF2575-%Pred-Post 04/22/2016 109  % Final  . FEF2575-%Change-Post 04/22/2016 97  % Final  . RV 04/22/2016 1.82  L Final  . RV % pred 04/22/2016 77  % Final  . TLC 04/22/2016 3.97  L Final  . TLC % pred 04/22/2016 65  % Final  . DLCO unc 04/22/2016 11.56  ml/min/mmHg Final  . DLCO unc % pred 04/22/2016 35  % Final  . DL/VA 04/22/2016 3.44  ml/min/mmHg/L Final  . DL/VA %  pred 04/22/2016 62  % Final  Hospital Outpatient Visit on 04/18/2016  Component Date Value Ref Range Status  . Ferritin 04/18/2016 95  11 - 307 ng/mL Final  . Iron 04/18/2016 108  28 - 170 ug/dL Final  . TIBC 04/18/2016 276  250 - 450 ug/dL Final  . Saturation Ratios 04/18/2016 39* 10.4 - 31.8 % Final  . UIBC 04/18/2016 168  ug/dL Final  . Hemoglobin 04/18/2016 10.0* 12.0 - 15.0 g/dL Final  Office Visit on 04/11/2016  Component Date Value Ref Range Status  . POC Glucose 04/11/2016 201* 70 - 99 mg/dl Final  Admission on 04/01/2016, Discharged on 04/01/2016  Component Date Value Ref Range Status  . WBC 04/01/2016 11.5* 4.0 - 10.5 K/uL Final  . RBC 04/01/2016 2.74* 3.87 - 5.11 MIL/uL Final  . Hemoglobin 04/01/2016 8.5* 12.0 - 15.0 g/dL Final  . HCT 04/01/2016 25.3* 36.0 - 46.0 % Final  . MCV 04/01/2016 92.3  78.0 - 100.0 fL Final  . MCH 04/01/2016 31.0  26.0 - 34.0 pg Final  . MCHC 04/01/2016 33.6  30.0 - 36.0 g/dL Final  . RDW 04/01/2016 14.3  11.5 - 15.5 % Final  . Platelets 04/01/2016 283  150 - 400 K/uL Final  . Neutrophils Relative % 04/01/2016 88  % Final  . Neutro Abs 04/01/2016 10.0* 1.7 - 7.7 K/uL Final  . Lymphocytes Relative 04/01/2016 6  % Final  . Lymphs Abs 04/01/2016 0.7  0.7 - 4.0 K/uL Final  . Monocytes Relative 04/01/2016 3  % Final  . Monocytes Absolute 04/01/2016 0.4  0.1 - 1.0 K/uL Final  . Eosinophils Relative 04/01/2016 3  % Final  . Eosinophils Absolute 04/01/2016 0.3  0.0 - 0.7 K/uL Final  .  Basophils Relative 04/01/2016 0  % Final  . Basophils Absolute 04/01/2016 0.0  0.0 - 0.1 K/uL Final  . Sodium 04/01/2016 139  135 - 145 mmol/L Final  . Potassium 04/01/2016 3.9  3.5 - 5.1 mmol/L Final  . Chloride 04/01/2016 108  101 - 111 mmol/L Final  . CO2 04/01/2016 23  22 - 32 mmol/L Final  . Glucose, Bld 04/01/2016 157* 65 - 99 mg/dL Final  . BUN 04/01/2016 52* 6 - 20 mg/dL Final  . Creatinine, Ser 04/01/2016 4.29* 0.44 - 1.00 mg/dL Final  . Calcium 04/01/2016  9.1  8.9 - 10.3 mg/dL Final  . Total Protein 04/01/2016 6.5  6.5 - 8.1 g/dL Final  . Albumin 04/01/2016 3.1* 3.5 - 5.0 g/dL Final  . AST 04/01/2016 24  15 - 41 U/L Final  . ALT 04/01/2016 24  14 - 54 U/L Final  . Alkaline Phosphatase 04/01/2016 100  38 - 126 U/L Final  . Total Bilirubin 04/01/2016 0.6  0.3 - 1.2 mg/dL Final  . GFR calc non Af Amer 04/01/2016 10* >60 mL/min Final  . GFR calc Af Amer 04/01/2016 12* >60 mL/min Final   Comment: (NOTE) The eGFR has been calculated using the CKD EPI equation. This calculation has not been validated in all clinical situations. eGFR's persistently <60 mL/min signify possible Chronic Kidney Disease.   . Anion gap 04/01/2016 8  5 - 15 Final  . B Natriuretic Peptide 04/01/2016 1074.4* 0.0 - 100.0 pg/mL Final  Admission on 03/25/2016, Discharged on 04/01/2016  No results displayed because visit has over 200 results.    Office Visit on 03/21/2016  Component Date Value Ref Range Status  . POC Glucose 03/21/2016 136* 70 - 99 mg/dl Final  Admission on 03/20/2016, Discharged on 03/20/2016  Component Date Value Ref Range Status  . WBC 03/20/2016 6.5  4.0 - 10.5 K/uL Final  . RBC 03/20/2016 2.99* 3.87 - 5.11 MIL/uL Final  . Hemoglobin 03/20/2016 9.5* 12.0 - 15.0 g/dL Final  . HCT 03/20/2016 28.1* 36.0 - 46.0 % Final  . MCV 03/20/2016 94.0  78.0 - 100.0 fL Final  . MCH 03/20/2016 31.8  26.0 - 34.0 pg Final  . MCHC 03/20/2016 33.8  30.0 - 36.0 g/dL Final  . RDW 03/20/2016 14.5  11.5 - 15.5 % Final  . Platelets 03/20/2016 237  150 - 400 K/uL Final  . B Natriuretic Peptide 03/20/2016 1247.0* 0.0 - 100.0 pg/mL Final  . Sodium 03/20/2016 140  135 - 145 mmol/L Final  . Potassium 03/20/2016 3.8  3.5 - 5.1 mmol/L Final  . Chloride 03/20/2016 105  101 - 111 mmol/L Final  . CO2 03/20/2016 26  22 - 32 mmol/L Final  . Glucose, Bld 03/20/2016 117* 65 - 99 mg/dL Final  . BUN 03/20/2016 38* 6 - 20 mg/dL Final  . Creatinine, Ser 03/20/2016 3.30* 0.44 - 1.00  mg/dL Final  . Calcium 03/20/2016 8.9  8.9 - 10.3 mg/dL Final  . Total Protein 03/20/2016 7.0  6.5 - 8.1 g/dL Final  . Albumin 03/20/2016 3.5  3.5 - 5.0 g/dL Final  . AST 03/20/2016 37  15 - 41 U/L Final  . ALT 03/20/2016 39  14 - 54 U/L Final  . Alkaline Phosphatase 03/20/2016 141* 38 - 126 U/L Final  . Total Bilirubin 03/20/2016 0.4  0.3 - 1.2 mg/dL Final  . GFR calc non Af Amer 03/20/2016 14* >60 mL/min Final  . GFR calc Af Amer 03/20/2016 16* >60 mL/min Final  Comment: (NOTE) The eGFR has been calculated using the CKD EPI equation. This calculation has not been validated in all clinical situations. eGFR's persistently <60 mL/min signify possible Chronic Kidney Disease.   . Anion gap 03/20/2016 9  5 - 15 Final  . Troponin I 03/20/2016 0.03* <0.03 ng/mL Final   Comment: CRITICAL RESULT CALLED TO, READ BACK BY AND VERIFIED WITH: SMITH,T AT 1915 ON 03/20/16 [facility] Outpatient Visit on 03/19/2016  Component Date Value Ref Range Status  . Ferritin 03/19/2016 91  11 - 307 ng/mL Final  . Iron 03/19/2016 51  28 - 170 ug/dL Final  . TIBC 03/19/2016 259  250 - 450 ug/dL Final  . Saturation Ratios 03/19/2016 20  10.4 - 31.8 % Final  . UIBC 03/19/2016 208  ug/dL Final  . Hemoglobin 03/19/2016 9.6* 12.0 - 15.0 g/dL Final  Office Visit on 03/15/2016  Component Date Value Ref Range Status  . Hemoglobin A1C 03/15/2016 5.9   Final  There may be more visits with results that are not included.    Dg Chest 2 View  Result Date: 05/10/2016 CLINICAL DATA:  Initial evaluation for pulmonary edema with CHF. EXAM: CHEST  2 VIEW COMPARISON:  Prior radiograph from 05/07/2016. FINDINGS: Right IJ approach central venous catheter remains in place. Median sternotomy wires underlying CABG markers and surgical clips noted, stable. Cardiomegaly unchanged. Lungs normally inflated. Interval improvement in an diffuse pulmonary edema, with mild residual perihilar vascular congestion now evident.  Continued interval clearance of opacity at the left lung base. No new focal airspace disease. Oblique linear opacity within the left mid lung most compatible with atelectasis/scar. No definite significant pleural effusion. No pneumothorax. No acute osseous abnormality. IMPRESSION: 1. Interval improvement/ resolution of previously seen pulmonary edema, with mild residual perihilar vascular congestion. 2. No new active cardiopulmonary disease identified. Electronically Signed   By: Jeannine Boga M.D.   On: 05/10/2016 01:10   Dg Chest Port 1 View  Result Date: 05/10/2016 CLINICAL DATA:  Dialysis catheter insertion EXAM: PORTABLE CHEST 1 VIEW COMPARISON:  05/09/2016 FINDINGS: Right jugular dialysis catheter place. Tip at the cavoatrial junction. No pneumothorax. Cardiomegaly. Low volumes. Normal vascularity. IMPRESSION: Right jugular dialysis catheter tip at the cavoatrial junction and no pneumothorax. Cardiomegaly. Electronically Signed   By: Marybelle Killings M.D.   On: 05/10/2016 12:44   Dg Chest Port 1 View  Result Date: 05/07/2016 CLINICAL DATA:  Pulmonary edema EXAM: PORTABLE CHEST 1 VIEW COMPARISON:  05/06/2016 FINDINGS: Endotracheal tube, NG tube, right jugular venous catheter are stable. The heart is moderately enlarged. Opacity at the left base has improved. Vascular congestion throughout the remainder of the lungs has improved. No pneumothorax. IMPRESSION: Improved opacity at the left base. Improved vascular congestion. Electronically Signed   By: Marybelle Killings M.D.   On: 05/07/2016 07:09   Dg Chest Port 1 View  Result Date: 05/06/2016 CLINICAL DATA:  Status post central line placement. EXAM: PORTABLE CHEST 1 VIEW COMPARISON:  Portable chest x-ray of today's date at 5:13 a.m. FINDINGS: The patient has undergone placement of a right internal jugular Cordis sheath. There is no postprocedure pneumothorax or hemothorax. The tip of the catheter overlies the proximal SVC. The lungs are reasonably well  inflated. There is persistent left lower lobe density with partial obscuration of the hemidiaphragm. The interstitial markings remain increased in both lungs with area of confluence. The cardiac silhouette remains enlarged. The endotracheal tube tip lies approximately 1.7 cm above the carina. The esophagogastric tube  tip projects below the inferior margin of the study. IMPRESSION: No postprocedure complication following placement of the right internal jugular Cordis sheath. The tip projects over the proximal SVC. Electronically Signed   By: David  Martinique M.D.   On: 05/06/2016 14:54   Dg Chest Port 1 View  Result Date: 05/06/2016 CLINICAL DATA:  Pneumonia. EXAM: PORTABLE CHEST 1 VIEW COMPARISON:  05/05/2016. FINDINGS: Endotracheal tube and NG tube in stable position. Prior CABG. Diffuse bilateral airspace disease again noted. No significant change. Low lung volumes. Small left pleural effusion cannot be excluded. No pneumothorax. IMPRESSION: 1. Lines and tubes in stable position. 2. Prior CABG with cardiomegaly. Diffuse bilateral airspace disease/pulmonary edema again noted. No significant change. Small left effusion cannot be excluded . Electronically Signed   By: Marcello Moores  Register   On: 05/06/2016 07:07   Dg Chest Portable 1 View  Result Date: 05/05/2016 CLINICAL DATA:  Respiratory failure EXAM: PORTABLE CHEST 1 VIEW COMPARISON:  May 04, 2016 FINDINGS: Support apparatus is stable. No pneumothorax. Infiltrate throughout the right lung is similar in the interval. There has been improvement on the left. No other changes. IMPRESSION: 1. Stable support apparatus. 2. Diffuse infiltrate on the right is similar in the interval. There has been improvement in the left infiltrate. Electronically Signed   By: Dorise Bullion III M.D   On: 05/05/2016 08:14   Dg Chest Port 1 View  Result Date: 05/04/2016 CLINICAL DATA:  Verify ET tube placement and OG tube placement EXAM: PORTABLE CHEST 1 VIEW COMPARISON:   05/04/2016 FINDINGS: ET tube tip is above the carina. The OG tube tip is within the stomach below the field of view. Stable cardiac enlargement. Previous median sternotomy and CABG procedure. Bilateral airspace opacities are unchanged from previous exam. IMPRESSION: 1. No change in aeration to lungs compared with previous exam. 2. Satisfactory position of ET tube and OG tube. Electronically Signed   By: Kerby Moors M.D.   On: 05/04/2016 09:51   Dg Chest Port 1 View  Result Date: 05/04/2016 CLINICAL DATA:  Hypoxia EXAM: PORTABLE CHEST 1 VIEW COMPARISON:  05/02/2016 FINDINGS: Post sternotomy changes are again evident. There is mild to moderate cardiomegaly. Slightly improved aeration of the left thorax. Slight worsening of air space disease in the right hemithorax. Possible small effusions. No pneumothorax. IMPRESSION: Cardiomegaly unchanged. Slightly improved aeration of the left thorax with residual infiltrate or edema in the left lung base and end oval opacity in the left mid to lower lung. Slightly worsened airspace disease in the right thorax which may represent edema or pneumonia. Electronically Signed   By: Donavan Foil M.D.   On: 05/04/2016 04:18   Dg Chest Portable 1 View  Result Date: 05/02/2016 CLINICAL DATA:  Shortness of breath and cough for 1 day. Hypoxia. Decreased air movement. EXAM: PORTABLE CHEST 1 VIEW COMPARISON:  Radiographs 04/01/2016 FINDINGS: Patient is post median sternotomy and CABG. Cardiomegaly appears increased. Peribronchial cuffing and diffuse interstitial opacities consistent with pulmonary edema. Hazy bilateral lower lobe opacities likely combination of pleural effusion and airspace disease. No pneumothorax. Unchanged osseous structures. IMPRESSION: 1. Pulmonary edema, pleural effusions and cardiomegaly, suspicious for CHF. 2. Hazy lower lobe opacities are likely a combination of pleural effusions and airspace disease, which may be atelectasis or pneumonia. Electronically  Signed   By: Jeb Levering M.D.   On: 05/02/2016 22:11   Dg Abd Portable 1v  Result Date: 05/04/2016 CLINICAL DATA:  Orogastric tube placement confirmation. EXAM: PORTABLE ABDOMEN - 1 VIEW COMPARISON:  None. FINDINGS: An orogastric tube extends below the diaphragm, through the stomach and with its tip positioned in the proximal duodenum at the level of the duodenal bulb. Underlying bowel gas shows no evidence of overt obstruction or significant ileus. IMPRESSION: Gastric tube extends through the stomach and its tip is positioned at the level of the duodenal bulb. Electronically Signed   By: Aletta Edouard M.D.   On: 05/04/2016 09:53   Dg Fluoro Guide Cv Line-no Report  Result Date: 05/10/2016 Fluoroscopy was utilized by the requesting physician.  No radiographic interpretation.     Assessment/Plan   ICD-9-CM ICD-10-CM   1. Depression with anxiety 300.4 F41.8   2. ESRD (end stage renal disease) (HCC) 585.6 N18.6   3. Chronic combined systolic and diastolic congestive heart failure (HCC) 428.42 I50.42    428.0    4. Hypothyroidism, acquired 244.9 E03.9   5. Type 2 diabetes mellitus with diabetic neuropathy, with long-term current use of insulin (HCC) 250.60 E11.40    357.2 Z79.4    V58.67    6. Essential hypertension 401.9 I10   7. Chronic bronchitis, unspecified chronic bronchitis type ([place]) 491.9 J42   8. Seasonal allergic rhinitis due to other allergic trigger, unspecified chronicity 477.8 J30.89   9. Gout, unspecified cause, unspecified chronicity, unspecified site 274.9 M10.9   10. Mixed hyperlipidemia 272.2 E78.2   11. Anemia of chronic disease 285.29 D63.8    Start citalopram 43m po daily for depression/anxiety  Cont other meds as ordered  Check TSH in 4 weeks  PT/OT/ST as ordered  F/u with nephro and HD as scheduled MWF via right IJ PC  Wound care as indicated  GOAL: short term rehab and d/c home when medically appropriate. Communicated with pt and nursing.  Will  follow  Ulus Hazen S. CPerlie Gold PBeth [facility] [address] StreetGRainsville [place] [phone] (Monday-Friday 8 AM - 5 PM) [phone] 5 PM and follow prompts

## 2016-05-22 ENCOUNTER — Other Ambulatory Visit: Payer: Self-pay | Admitting: Licensed Clinical Social Worker

## 2016-05-22 DIAGNOSIS — E876 Hypokalemia: Secondary | ICD-10-CM | POA: Diagnosis not present

## 2016-05-22 DIAGNOSIS — D509 Iron deficiency anemia, unspecified: Secondary | ICD-10-CM | POA: Diagnosis not present

## 2016-05-22 DIAGNOSIS — N186 End stage renal disease: Secondary | ICD-10-CM | POA: Diagnosis not present

## 2016-05-22 DIAGNOSIS — N2581 Secondary hyperparathyroidism of renal origin: Secondary | ICD-10-CM | POA: Diagnosis not present

## 2016-05-22 DIAGNOSIS — Z23 Encounter for immunization: Secondary | ICD-10-CM | POA: Diagnosis not present

## 2016-05-22 DIAGNOSIS — E1129 Type 2 diabetes mellitus with other diabetic kidney complication: Secondary | ICD-10-CM | POA: Diagnosis not present

## 2016-05-22 NOTE — Patient Outreach (Signed)
[facility]) Care Management  05/22/2016  ARPITA FENTRESS 11/02/1955 [id number]   Assessment- CSW received return call from Michaelle Copas from [employer]. She reports that she cannot provide any information without consent. She reports that patient will need to contact her to inquire about her status. She also states that patient can contact her and provide verbal consent to discuss case with [facility] CSW.  Plan-CSW will follow up with SNF within one week.  [name], BSW, MSW, [email or web address] Phone: [phone] Fax: [phone]

## 2016-05-23 ENCOUNTER — Other Ambulatory Visit: Payer: Self-pay | Admitting: *Deleted

## 2016-05-23 DIAGNOSIS — N186 End stage renal disease: Secondary | ICD-10-CM

## 2016-05-23 DIAGNOSIS — Z992 Dependence on renal dialysis: Principal | ICD-10-CM

## 2016-05-24 ENCOUNTER — Other Ambulatory Visit: Payer: Self-pay | Admitting: Licensed Clinical Social Worker

## 2016-05-24 DIAGNOSIS — E876 Hypokalemia: Secondary | ICD-10-CM | POA: Diagnosis not present

## 2016-05-24 DIAGNOSIS — D509 Iron deficiency anemia, unspecified: Secondary | ICD-10-CM | POA: Diagnosis not present

## 2016-05-24 DIAGNOSIS — N186 End stage renal disease: Secondary | ICD-10-CM | POA: Diagnosis not present

## 2016-05-24 DIAGNOSIS — E1129 Type 2 diabetes mellitus with other diabetic kidney complication: Secondary | ICD-10-CM | POA: Diagnosis not present

## 2016-05-24 DIAGNOSIS — Z23 Encounter for immunization: Secondary | ICD-10-CM | POA: Diagnosis not present

## 2016-05-24 DIAGNOSIS — N2581 Secondary hyperparathyroidism of renal origin: Secondary | ICD-10-CM | POA: Diagnosis not present

## 2016-05-24 NOTE — Patient Outreach (Signed)
[facility]) Care Management  05/24/2016  Brooke Stafford Jun 25, 1955 [id number]  Assessment- CSW completed call to SNF social worker Roxanne and she answered. She reports that patient will be discharging back home with home health on 05/29/16. She reports that PT is recommending home health with PT/OT and a rollator walker with a seat and a shower chair. CSW completed call to patient. Patient did not answer but CSW left a voice message encouraging her to call [employer]. CSW will continue to follow case.  Plan-CSW will follow up next week once patient discharges home and will notify [place].  [name], BSW, MSW, [email or web address] Phone: [phone] Fax: [phone]

## 2016-05-27 ENCOUNTER — Encounter: Payer: Self-pay | Admitting: Vascular Surgery

## 2016-05-27 ENCOUNTER — Other Ambulatory Visit: Payer: Self-pay

## 2016-05-27 DIAGNOSIS — E876 Hypokalemia: Secondary | ICD-10-CM | POA: Diagnosis not present

## 2016-05-27 DIAGNOSIS — N186 End stage renal disease: Secondary | ICD-10-CM | POA: Diagnosis not present

## 2016-05-27 DIAGNOSIS — Z23 Encounter for immunization: Secondary | ICD-10-CM | POA: Diagnosis not present

## 2016-05-27 DIAGNOSIS — N2581 Secondary hyperparathyroidism of renal origin: Secondary | ICD-10-CM | POA: Diagnosis not present

## 2016-05-27 DIAGNOSIS — D509 Iron deficiency anemia, unspecified: Secondary | ICD-10-CM | POA: Diagnosis not present

## 2016-05-27 DIAGNOSIS — E1129 Type 2 diabetes mellitus with other diabetic kidney complication: Secondary | ICD-10-CM | POA: Diagnosis not present

## 2016-05-28 NOTE — Patient Outreach (Signed)
    Telephone call received from patient who requested assistance with application for [facility] Patient states she has been in contact with Ms. Green at [place].  RNCM made call to Ms. Green on patient's behalf. Mrs. Nyoka Cowden advised this RNCM patient had application with [facility], had not been responded to any letters sent. Ms. Nyoka Cowden also advised that patient would need to apply online to be placed on waiting list forhousing.  RNCM made attempt to contact patient, however, attempt was unsuccessful. HIPPA compliant message left for patient with this RNCM's contact information and request to return call.  Plan: Patient is in an area skilled nursing facility, this RNCM's plan is to re-engage once patient is discharged.

## 2016-05-29 ENCOUNTER — Other Ambulatory Visit: Payer: Self-pay | Admitting: Licensed Clinical Social Worker

## 2016-05-29 DIAGNOSIS — E1129 Type 2 diabetes mellitus with other diabetic kidney complication: Secondary | ICD-10-CM | POA: Diagnosis not present

## 2016-05-29 DIAGNOSIS — N2581 Secondary hyperparathyroidism of renal origin: Secondary | ICD-10-CM | POA: Diagnosis not present

## 2016-05-29 DIAGNOSIS — E876 Hypokalemia: Secondary | ICD-10-CM | POA: Diagnosis not present

## 2016-05-29 DIAGNOSIS — Z23 Encounter for immunization: Secondary | ICD-10-CM | POA: Diagnosis not present

## 2016-05-29 DIAGNOSIS — N186 End stage renal disease: Secondary | ICD-10-CM | POA: Diagnosis not present

## 2016-05-29 DIAGNOSIS — D509 Iron deficiency anemia, unspecified: Secondary | ICD-10-CM | POA: Diagnosis not present

## 2016-05-29 NOTE — Patient Outreach (Signed)
[facility]) Care Management  05/29/2016  Brooke Stafford 1955/05/31 [id number]   Assessment-CSW completed outreach attempt today. CSW unable to reach patient successfully. CSW left a HIPPA compliant voice message encouraging patient to return call once available.  Plan-CSW will await return call or complete an additional outreach if needed.  Brooke Stafford, Brooke Stafford, Brooke Stafford, [name] Phone: [phone] Fax: [phone]

## 2016-05-30 ENCOUNTER — Other Ambulatory Visit: Payer: Self-pay | Admitting: Licensed Clinical Social Worker

## 2016-05-30 NOTE — Patient Outreach (Signed)
[facility]) Care Management  05/30/2016  RENDY LAZARD 07-16-1955 [id number]   Assessment- CSW received incoming call from patient. She reports that she is still at [facility] and she hurt her back during physical therapy and has appealed the decision to be discharged. She reports that she is waiting to hear back in regards to whether or not the appeal was approved.  CSW questioned if she had contacted Mrs.Green with [employer]. She reports that she did and found out that she was not in the system and therefore is not on the wait list for Section 8 because she had not responded to any of the letters that were mailed to her. Patient was advised to apply online to be placed on the wait list for [facility] CSW questioned if she had a computer at home. She reports that she has a very small tablet but might could use it. CSW asked if there was a computer at SNF that she could use and she states she is unsure. CSW suggested scheduling SNF visit this week in order to apply for Section 8 together. She reports "this week isn't a good week."  Plan-CSW will follow up within one week and continue to monitor discharge plans from SNF.  [name], BSW, MSW, [email or web address] Phone: [phone] Fax: [phone]

## 2016-05-31 ENCOUNTER — Ambulatory Visit (HOSPITAL_COMMUNITY)
Admission: RE | Admit: 2016-05-31 | Discharge: 2016-05-31 | Disposition: A | Discharge status: Home / Self Care | Payer: No Typology Code available for payment source | Source: Ambulatory Visit | Attending: Vascular Surgery | Admitting: Vascular Surgery

## 2016-05-31 DIAGNOSIS — N186 End stage renal disease: Secondary | ICD-10-CM | POA: Insufficient documentation

## 2016-05-31 DIAGNOSIS — Z992 Dependence on renal dialysis: Secondary | ICD-10-CM | POA: Diagnosis not present

## 2016-05-31 DIAGNOSIS — E1129 Type 2 diabetes mellitus with other diabetic kidney complication: Secondary | ICD-10-CM | POA: Diagnosis not present

## 2016-05-31 DIAGNOSIS — D509 Iron deficiency anemia, unspecified: Secondary | ICD-10-CM | POA: Diagnosis not present

## 2016-05-31 DIAGNOSIS — Z23 Encounter for immunization: Secondary | ICD-10-CM | POA: Diagnosis not present

## 2016-05-31 DIAGNOSIS — N2581 Secondary hyperparathyroidism of renal origin: Secondary | ICD-10-CM | POA: Diagnosis not present

## 2016-05-31 DIAGNOSIS — E876 Hypokalemia: Secondary | ICD-10-CM | POA: Diagnosis not present

## 2016-06-03 ENCOUNTER — Other Ambulatory Visit: Payer: Self-pay | Admitting: Licensed Clinical Social Worker

## 2016-06-03 DIAGNOSIS — N186 End stage renal disease: Secondary | ICD-10-CM | POA: Diagnosis not present

## 2016-06-03 DIAGNOSIS — E1129 Type 2 diabetes mellitus with other diabetic kidney complication: Secondary | ICD-10-CM | POA: Diagnosis not present

## 2016-06-03 DIAGNOSIS — E876 Hypokalemia: Secondary | ICD-10-CM | POA: Diagnosis not present

## 2016-06-03 DIAGNOSIS — D509 Iron deficiency anemia, unspecified: Secondary | ICD-10-CM | POA: Diagnosis not present

## 2016-06-03 DIAGNOSIS — Z23 Encounter for immunization: Secondary | ICD-10-CM | POA: Diagnosis not present

## 2016-06-03 DIAGNOSIS — N2581 Secondary hyperparathyroidism of renal origin: Secondary | ICD-10-CM | POA: Diagnosis not present

## 2016-06-03 NOTE — Patient Outreach (Signed)
[facility]) Care Management  06/03/2016  Brooke Stafford [birth year] [id number]   Assessment-CSW completed outreach attempt today to discuss community resources. CSW unable to reach patient successfully. CSW left a HIPPA compliant voice message encouraging patient to return call once available.  CSW completed outreach to SNF and spoke with SNF social worker. Brooke Stafford reports that patient had appealed her discharge and that her case is still under appeal. Brooke Stafford reports that her insurance has requested more information in regards to patient's status and this will be sent to them today. Brooke Stafford has no further updates but wanted to gather information on [facility] and our plan for follow up post discharge.   Plan-CSW will complete SNF visit this week.   [name], BSW, MSW, [email or web address] Phone: [phone] Fax: [phone]

## 2016-06-04 ENCOUNTER — Encounter: Payer: Self-pay | Admitting: Vascular Surgery

## 2016-06-04 ENCOUNTER — Other Ambulatory Visit: Payer: Self-pay

## 2016-06-04 ENCOUNTER — Ambulatory Visit (INDEPENDENT_AMBULATORY_CARE_PROVIDER_SITE_OTHER): Payer: Self-pay | Admitting: Vascular Surgery

## 2016-06-04 ENCOUNTER — Other Ambulatory Visit: Payer: Self-pay | Admitting: Licensed Clinical Social Worker

## 2016-06-04 VITALS — BP 137/74 | HR 73 | Temp 98.7°F | Resp 18 | Ht 70.0 in | Wt 210.0 lb

## 2016-06-04 DIAGNOSIS — Z992 Dependence on renal dialysis: Secondary | ICD-10-CM

## 2016-06-04 DIAGNOSIS — N186 End stage renal disease: Secondary | ICD-10-CM

## 2016-06-04 NOTE — Patient Outreach (Signed)
[facility]) Care Management  [facility] Social Work  06/04/2016  Brooke Stafford 03/19/1956 [id number]  Encounter Medications:  Outpatient Encounter Prescriptions as of 06/04/2016  Medication Sig  . albuterol (PROVENTIL HFA;VENTOLIN HFA) 108 (90 Base) MCG/ACT inhaler Inhale 2 puffs into the lungs every 6 (six) hours as needed for wheezing or shortness of breath.  [name] albuterol (PROVENTIL) (2.5 MG/3ML) 0.083% nebulizer solution Take 3 mLs (2.5 mg total) by nebulization every 6 (six) hours as needed for wheezing or shortness of breath.  . allopurinol (ZYLOPRIM) 300 MG tablet Take 1 tablet (300 mg total) by mouth daily.  [name] amLODipine (NORVASC) 10 MG tablet take 1 tablet by mouth once daily  . aspirin 81 MG chewable tablet Chew 1 tablet (81 mg total) by mouth daily.  [name] atorvastatin (LIPITOR) 40 MG tablet Take 1 tablet (40 mg total) by mouth daily.  . benzonatate (TESSALON) 100 MG capsule Take 100 mg by mouth 3 (three) times daily as needed for cough.  . calcitRIOL (ROCALTROL) 0.25 MCG capsule Take 0.25 mcg by mouth daily.  . cloNIDine (CATAPRES) 0.2 MG tablet Take 1 tablet (0.2 mg total) by mouth 3 (three) times daily.  . clopidogrel (PLAVIX) 75 MG tablet Take 1 tablet (75 mg total) by mouth daily.  . Darbepoetin Alfa (ARANESP) 200 MCG/0.4ML SOSY injection Inject 0.4 mLs (200 mcg total) into the vein every Tuesday with hemodialysis.  [name] ezetimibe (ZETIA) 10 MG tablet Take 1 tablet (10 mg total) by mouth daily.  . ferric gluconate 125 mg in sodium chloride 0.9 % 100 mL Inject 125 mg into the vein Every Tuesday,Thursday,and Saturday with dialysis.  . fluticasone (FLONASE) 50 MCG/ACT nasal spray Place 2 sprays into both nostrils daily as needed for allergies or rhinitis.  . Fluticasone-Salmeterol (ADVAIR DISKUS) 250-50 MCG/DOSE AEPB Inhale 1 puff into the lungs 2 (two) times daily.  . hydrOXYzine (ATARAX/VISTARIL) 25 MG tablet Take 1 tablet (25 mg total) by mouth 3 (three) times daily.   . insulin aspart (NOVOLOG) 100 UNIT/ML injection Inject into the skin 3 (three) times daily before meals. Inject as per sliding scale: 0-150=0 units, 151+=5 units  . Insulin Glargine (TOUJEO SOLOSTAR) 300 UNIT/ML SOPN Inject 30 Units into the skin at bedtime.  [name] levothyroxine (SYNTHROID, LEVOTHROID) 25 MCG tablet take 1 tablet by mouth every morning BEFORE BREAKFAST  . metoprolol tartrate (LOPRESSOR) 25 MG tablet Take 1 tablet (25 mg total) by mouth 2 (two) times daily.  . Naphazoline HCl (CLEAR EYES OP) Place 1 drop into both eyes as needed (for dry eyes).  . nitroGLYCERIN (NITROSTAT) 0.4 MG SL tablet Place 1 tablet (0.4 mg total) under the tongue every 5 (five) minutes as needed.  . prednisoLONE acetate (PRED FORTE) 1 % ophthalmic suspension Place 1 drop into both eyes 3 (three) times daily.  . ranitidine (ZANTAC 75) 75 MG tablet Take 1 tablet (75 mg total) by mouth 2 (two) times daily.  [name] senna-docusate (SENOKOT-S) 8.6-50 MG tablet Take 1 tablet by mouth at bedtime as needed for mild constipation.  . sevelamer carbonate (RENVELA) 2.4 g PACK Take 2.4 g by mouth 3 (three) times daily with meals.  . triamcinolone ointment (KENALOG) 0.5 % Apply 1 application topically 2 (two) times daily.   No facility-administered encounter medications on file as of 06/04/2016.     Functional Status:  In your present state of health, do you have any difficulty performing the following activities: 05/15/2016 05/04/2016  Hearing? N N  Vision? - N  Difficulty  concentrating or making decisions? - N  Walking or climbing stairs? - Y  Dressing or bathing? - N  Doing errands, shopping? - Facilities manager and eating ? - -  Using the Toilet? - -  In the past six months, have you accidently leaked urine? - -  Do you have problems with loss of bowel control? - -  Managing your Medications? - -  Managing your Finances? - -  Housekeeping or managing your Housekeeping? - -  Some recent data might be hidden     Fall/Depression Screening:  PHQ 2/9 Scores 05/15/2016 04/26/2016 04/03/2016 03/11/2016 02/23/2016 01/11/2016 01/04/2016  PHQ - 2 Score 2 2 0 2 6 1 3   PHQ- 9 Score 8 9 - 6 27 - 12    Assessment: CSW completed SNF visit with patient and her significant other at [facility] on 06/04/16. Patient reports that she is still having a difficult time adjusting to getting dialysis. She states that the day after she gets dialysis she "doesn't feel good." She states that she is having pain in her legs, hands and feet. She has a flat affect today like she did during last SNF visit but was able to smile and laugh a few times during the session. She reports that she has reviewed mental health resources that CSW provided but feels she needs to focus more on her health at this time. CSW strongly encouraged her to consider going back to grief counseling. CSW provided her with another handout of mental health resources within [facility]. CSW provided education on appropriate coping tools as well during session.   CSW reminded patient that she was not on the wait list for Section 8 due to not responding the letters that we mailed to her. CSW asked if she would like for her to apply for Public Housing during session and she confirmed. Patient had to create an email address in order to submit an application online. CSW assisted patient with completing online application and application was successfully submitted for Public Housing (not Section 8 as wait list is closed.) Patient appreciative of assistance.   Patient is now successfully on the wait list for [employer].  Plan: CSW will continue to follow patient and await for SNF discharge. Patient is still waiting to hear back in regards to appeal.  [facility] CM Care Plan Problem One   Flowsheet Row Most Recent Value  Care Plan Problem One  SNF admission and lack of resources  Role Documenting the Problem One  Clinical Social Worker  Care Plan for Problem One   Active  [facility] Long Term Goal (31-90 days)  Patient will have a safe and stable discharge back home from SNF within 90 days per patient report  [facility] Long Term Goal Start Date  05/15/16  Interventions for Problem One Long Term Goal  CSW completed SNF visit with family and provided resource education. CSW will coordinate care with patient, family and SNF d/c planner to ensure that she has all resources, services and medical equipment that she needs post discharge.  [facility] CM Short Term Goal #1 (0-30 days)  Patient will gain mental health resource information within 30 days and will be able to read back three resources per patient report during outreach  [facility] CM Short Term Goal #1 Start Date  05/15/16  Interventions for Short Term Goal #1  CSW again reviewed mental health resources during SNF visit today. Patient has two handouts for her to keep and review when  needed. Coping skill education provided again today as well.     [name], BSW, MSW, [email or web address] Phone: [phone] Fax: [phone]

## 2016-06-04 NOTE — Progress Notes (Signed)
POST OPERATIVE OFFICE NOTE    CC:  F/u for surgery  HPI:  This is a 61 y.o. female who is s/p 1st stage left BVT on 05/10/16.  She states she is doing well.  She has intermittent numbness of her left hand that is tolerable.  She dialyzes M/W/F.   She is on Plavix.   Allergies  Allergen Reactions  . Digoxin And Related Other (See Comments)    Patient stated she almost died. Had flu like symptoms as well as diarrhea.  . Hydralazine Shortness Of Breath  . Penicillins Cross Reactors Hives and Other (See Comments)    HIGH FEVER  Has patient had a PCN reaction causing immediate rash, facial/tongue/throat swelling, SOB or lightheadedness with hypotension: No Has patient had a PCN reaction causing SEVERE RASH INVOLVING MUCUS MEMBRANES or SKIN NECROSIS  #  #  #  YES  #  #  #  Has patient had a PCN reaction that required hospitalization: Unk Has patient had a PCN reaction occurring within the last 10 years: Unk If all of the above answers are "NO", then may proceed with Cephalosporin use.   [name] Lisinopril Other (See Comments)    Felt like she had the flu. Was very sick!!!  . Adhesive [Tape] Rash    bruising    Current Outpatient Prescriptions  Medication Sig Dispense Refill  . albuterol (PROVENTIL HFA;VENTOLIN HFA) 108 (90 Base) MCG/ACT inhaler Inhale 2 puffs into the lungs every 6 (six) hours as needed for wheezing or shortness of breath. 3 Inhaler 3  . albuterol (PROVENTIL) (2.5 MG/3ML) 0.083% nebulizer solution Take 3 mLs (2.5 mg total) by nebulization every 6 (six) hours as needed for wheezing or shortness of breath. 150 mL 3  . allopurinol (ZYLOPRIM) 300 MG tablet Take 1 tablet (300 mg total) by mouth daily. 90 tablet 3  . amLODipine (NORVASC) 10 MG tablet take 1 tablet by mouth once daily 30 tablet 11  . aspirin 81 MG chewable tablet Chew 1 tablet (81 mg total) by mouth daily. 30 tablet 10  . atorvastatin (LIPITOR) 40 MG tablet Take 1 tablet (40 mg total) by mouth daily. 30 tablet 3  .  benzonatate (TESSALON) 100 MG capsule Take 100 mg by mouth 3 (three) times daily as needed for cough.  0  . calcitRIOL (ROCALTROL) 0.25 MCG capsule Take 0.25 mcg by mouth daily.  0  . cloNIDine (CATAPRES) 0.2 MG tablet Take 1 tablet (0.2 mg total) by mouth 3 (three) times daily. 90 tablet 3  . clopidogrel (PLAVIX) 75 MG tablet Take 1 tablet (75 mg total) by mouth daily. 90 tablet 3  . Darbepoetin Alfa (ARANESP) 200 MCG/0.4ML SOSY injection Inject 0.4 mLs (200 mcg total) into the vein every Tuesday with hemodialysis. 1.68 mL   . ezetimibe (ZETIA) 10 MG tablet Take 1 tablet (10 mg total) by mouth daily. 90 tablet 3  . ferric gluconate 125 mg in sodium chloride 0.9 % 100 mL Inject 125 mg into the vein Every Tuesday,Thursday,and Saturday with dialysis.    . fluticasone (FLONASE) 50 MCG/ACT nasal spray Place 2 sprays into both nostrils daily as needed for allergies or rhinitis. 16 g 3  . Fluticasone-Salmeterol (ADVAIR DISKUS) 250-50 MCG/DOSE AEPB Inhale 1 puff into the lungs 2 (two) times daily. 60 each 3  . hydrOXYzine (ATARAX/VISTARIL) 25 MG tablet Take 1 tablet (25 mg total) by mouth 3 (three) times daily. 90 tablet 3  . insulin aspart (NOVOLOG) 100 UNIT/ML injection Inject into the skin  3 (three) times daily before meals. Inject as per sliding scale: 0-150=0 units, 151+=5 units    . Insulin Glargine (TOUJEO SOLOSTAR) 300 UNIT/ML SOPN Inject 30 Units into the skin at bedtime.    [name] levothyroxine (SYNTHROID, LEVOTHROID) 25 MCG tablet take 1 tablet by mouth every morning BEFORE BREAKFAST 30 tablet 2  . metoprolol tartrate (LOPRESSOR) 25 MG tablet Take 1 tablet (25 mg total) by mouth 2 (two) times daily. 60 tablet 0  . Naphazoline HCl (CLEAR EYES OP) Place 1 drop into both eyes as needed (for dry eyes).    . nitroGLYCERIN (NITROSTAT) 0.4 MG SL tablet Place 1 tablet (0.4 mg total) under the tongue every 5 (five) minutes as needed. 30 tablet 2  . prednisoLONE acetate (PRED FORTE) 1 % ophthalmic suspension  Place 1 drop into both eyes 3 (three) times daily.  1  . ranitidine (ZANTAC 75) 75 MG tablet Take 1 tablet (75 mg total) by mouth 2 (two) times daily. 60 tablet 3  . senna-docusate (SENOKOT-S) 8.6-50 MG tablet Take 1 tablet by mouth at bedtime as needed for mild constipation. 30 tablet 0  . sevelamer carbonate (RENVELA) 2.4 g PACK Take 2.4 g by mouth 3 (three) times daily with meals. 90 each 0  . triamcinolone ointment (KENALOG) 0.5 % Apply 1 application topically 2 (two) times daily. 30 g 2   No current facility-administered medications for this visit.      ROS:  See HPI  Physical Exam:  Vitals:   06/04/16 1423  BP: 137/74  Pulse: 73  Resp: 18  Temp: 98.7 F (37.1 C)    Incision:  Healing nicely Extremities:  Excellent thrill within the fistula; +bruit; + doppler signal left radial/ulnar.  Motor and sensation are in tact left hand.    Dialysis Fistula Duplex Evaluation 05/31/16: Diameter of left BVT measures 0.57cm-0.73cm Depth measures 0.7cm-2.30cm   Assessment/Plan:  This is a 61 y.o. female who is s/p: 1st stage left basilic vein transposition  -pt has intermittent numbness in her left hand that is tolerable.  Her motor and sensation are in tact. -the fistula is maturing nicely.  We will set her up for 2nd stage BVT in the next couple of weeks.  She dialyzes M/W/F and since Dr. Donnetta Hutching does not operate on T/T, her dialysis days will have to be coordinated by our scheduler and the dialysis center [facility]). -she is on Plavix and this will not be held per Dr. Annia Belt, PA-C [facility] [phone]  Clinic MD:  Pt seen and examined with Dr. Donnetta Hutching  I have examined the patient, reviewed and agree with above.  Curt Jews, MD 06/04/2016 4:03 PM

## 2016-06-05 DIAGNOSIS — E1129 Type 2 diabetes mellitus with other diabetic kidney complication: Secondary | ICD-10-CM | POA: Diagnosis not present

## 2016-06-05 DIAGNOSIS — N2581 Secondary hyperparathyroidism of renal origin: Secondary | ICD-10-CM | POA: Diagnosis not present

## 2016-06-05 DIAGNOSIS — Z23 Encounter for immunization: Secondary | ICD-10-CM | POA: Diagnosis not present

## 2016-06-05 DIAGNOSIS — D509 Iron deficiency anemia, unspecified: Secondary | ICD-10-CM | POA: Diagnosis not present

## 2016-06-05 DIAGNOSIS — N186 End stage renal disease: Secondary | ICD-10-CM | POA: Diagnosis not present

## 2016-06-05 DIAGNOSIS — E876 Hypokalemia: Secondary | ICD-10-CM | POA: Diagnosis not present

## 2016-06-07 DIAGNOSIS — N186 End stage renal disease: Secondary | ICD-10-CM | POA: Diagnosis not present

## 2016-06-07 DIAGNOSIS — D509 Iron deficiency anemia, unspecified: Secondary | ICD-10-CM | POA: Diagnosis not present

## 2016-06-07 DIAGNOSIS — E1129 Type 2 diabetes mellitus with other diabetic kidney complication: Secondary | ICD-10-CM | POA: Diagnosis not present

## 2016-06-07 DIAGNOSIS — Z23 Encounter for immunization: Secondary | ICD-10-CM | POA: Diagnosis not present

## 2016-06-07 DIAGNOSIS — E876 Hypokalemia: Secondary | ICD-10-CM | POA: Diagnosis not present

## 2016-06-07 DIAGNOSIS — N2581 Secondary hyperparathyroidism of renal origin: Secondary | ICD-10-CM | POA: Diagnosis not present

## 2016-06-10 DIAGNOSIS — N2581 Secondary hyperparathyroidism of renal origin: Secondary | ICD-10-CM | POA: Diagnosis not present

## 2016-06-10 DIAGNOSIS — E876 Hypokalemia: Secondary | ICD-10-CM | POA: Diagnosis not present

## 2016-06-10 DIAGNOSIS — D509 Iron deficiency anemia, unspecified: Secondary | ICD-10-CM | POA: Diagnosis not present

## 2016-06-10 DIAGNOSIS — Z23 Encounter for immunization: Secondary | ICD-10-CM | POA: Diagnosis not present

## 2016-06-10 DIAGNOSIS — E1129 Type 2 diabetes mellitus with other diabetic kidney complication: Secondary | ICD-10-CM | POA: Diagnosis not present

## 2016-06-10 DIAGNOSIS — N186 End stage renal disease: Secondary | ICD-10-CM | POA: Diagnosis not present

## 2016-06-12 ENCOUNTER — Other Ambulatory Visit: Payer: Self-pay | Admitting: Licensed Clinical Social Worker

## 2016-06-12 DIAGNOSIS — D509 Iron deficiency anemia, unspecified: Secondary | ICD-10-CM | POA: Diagnosis not present

## 2016-06-12 DIAGNOSIS — N2581 Secondary hyperparathyroidism of renal origin: Secondary | ICD-10-CM | POA: Diagnosis not present

## 2016-06-12 DIAGNOSIS — I129 Hypertensive chronic kidney disease with stage 1 through stage 4 chronic kidney disease, or unspecified chronic kidney disease: Secondary | ICD-10-CM | POA: Diagnosis not present

## 2016-06-12 DIAGNOSIS — Z992 Dependence on renal dialysis: Secondary | ICD-10-CM | POA: Diagnosis not present

## 2016-06-12 DIAGNOSIS — E876 Hypokalemia: Secondary | ICD-10-CM | POA: Diagnosis not present

## 2016-06-12 DIAGNOSIS — N186 End stage renal disease: Secondary | ICD-10-CM | POA: Diagnosis not present

## 2016-06-12 DIAGNOSIS — Z23 Encounter for immunization: Secondary | ICD-10-CM | POA: Diagnosis not present

## 2016-06-12 DIAGNOSIS — E1129 Type 2 diabetes mellitus with other diabetic kidney complication: Secondary | ICD-10-CM | POA: Diagnosis not present

## 2016-06-12 NOTE — Patient Outreach (Signed)
[place] [facility]) Care Management  06/12/2016  BRANDELYN HENNE 1955/06/18 [id number]   Assessment- CSW completed outreach to patient but was unable to reach her. HIPPA compliant voice message was left encouraging return call.   CSW completed call to SNF and found out that patient had discharged back home. CSW spoke to SNF social worker Lupita Dawn and was informed that she discharged back home on 06/06/16 with [facility] with [facility] and that she will have PT, OT and Nursing. She also states that patient would continue going and receiving dialysis throughout the week.  Plan-CSW will update [facility] RNCM. CSW will await for return call from patient or make an additional outreach within two weeks.  [name], BSW, MSW, [email or web address] Phone: [phone] Fax: [phone]

## 2016-06-13 ENCOUNTER — Other Ambulatory Visit: Payer: Self-pay | Admitting: Licensed Clinical Social Worker

## 2016-06-13 NOTE — Patient Outreach (Signed)
[facility]) Care Management  06/13/2016  MARYON KEMNITZ 1955-09-12 [id number]  Assessment- CSW received return voice message from patient. CSW completed outreach back but was unable to reach her. HIPPA compliant voice message was left.  Plan-CSW will complete an additional outreach tomorrow on 06/14/16.  [name], BSW, MSW, [email or web address] Phone: [phone] Fax: [phone]

## 2016-06-14 ENCOUNTER — Other Ambulatory Visit: Payer: Self-pay | Admitting: Licensed Clinical Social Worker

## 2016-06-14 ENCOUNTER — Ambulatory Visit: Payer: Medicare Other | Admitting: Internal Medicine

## 2016-06-14 DIAGNOSIS — D631 Anemia in chronic kidney disease: Secondary | ICD-10-CM | POA: Diagnosis not present

## 2016-06-14 DIAGNOSIS — R509 Fever, unspecified: Secondary | ICD-10-CM | POA: Diagnosis not present

## 2016-06-14 DIAGNOSIS — E1129 Type 2 diabetes mellitus with other diabetic kidney complication: Secondary | ICD-10-CM | POA: Diagnosis not present

## 2016-06-14 DIAGNOSIS — N2581 Secondary hyperparathyroidism of renal origin: Secondary | ICD-10-CM | POA: Diagnosis not present

## 2016-06-14 DIAGNOSIS — N186 End stage renal disease: Secondary | ICD-10-CM | POA: Diagnosis not present

## 2016-06-14 DIAGNOSIS — D509 Iron deficiency anemia, unspecified: Secondary | ICD-10-CM | POA: Diagnosis not present

## 2016-06-14 NOTE — Patient Outreach (Signed)
[facility] [facility]) Care Management  06/14/2016  Brooke Stafford 02-06-1956 [id number]   Assessment- CSW completed outreach to patient on 06/14/16 and patient answered. HIPPA verifications were received. She reports that she is doing "very good." Patient seems more herself now that she not at SNF. She reports that her mood "it's a lot better now that I'm back home." She states that she was not able to sleep at SNF which affected her overall mood. She denies experiencing any depressive symptoms at this time. She states that she continues to receive dialysis Mondays, Wednesdays and Fridays. She reports that she has stable transportation through [facility] to these appointments. Patient denies experiencing any chest pain or SOB stating "I haven't experienced that in weeks." CSW questioned if she had received anything in the mail from public housing yet and she declined. Patient has upcoming second stage basilic vein transposition procedure scheduled for 06/19/16. Patient reports that she has stable transportation for procedure. CSW completed review of financial resources. Patient reports that she still has packet of resources that CSW provided her while she as at [facility].   Plan-CSW will follow up with patient within one-two weeks and will continue to provide social work assistance.  Brooke Stafford, BSW, MSW, [email or web address] Phone: [phone] Fax: [phone]

## 2016-06-17 ENCOUNTER — Other Ambulatory Visit: Payer: Self-pay | Admitting: Licensed Clinical Social Worker

## 2016-06-17 DIAGNOSIS — N2581 Secondary hyperparathyroidism of renal origin: Secondary | ICD-10-CM | POA: Diagnosis not present

## 2016-06-17 DIAGNOSIS — D509 Iron deficiency anemia, unspecified: Secondary | ICD-10-CM | POA: Diagnosis not present

## 2016-06-17 DIAGNOSIS — N186 End stage renal disease: Secondary | ICD-10-CM | POA: Diagnosis not present

## 2016-06-17 DIAGNOSIS — R509 Fever, unspecified: Secondary | ICD-10-CM | POA: Diagnosis not present

## 2016-06-17 DIAGNOSIS — I770 Arteriovenous fistula, acquired: Secondary | ICD-10-CM | POA: Insufficient documentation

## 2016-06-17 DIAGNOSIS — E1129 Type 2 diabetes mellitus with other diabetic kidney complication: Secondary | ICD-10-CM | POA: Diagnosis not present

## 2016-06-17 NOTE — Progress Notes (Signed)
Location:    Psychiatrist of Service:  SNF (31)   CODE STATUS: full code  Allergies  Allergen Reactions  . Digoxin And Related Other (See Comments)    Patient stated she almost died. Had flu like symptoms as well as diarrhea.  . Hydralazine Shortness Of Breath  . Penicillins Cross Reactors Hives and Other (See Comments)    HIGH FEVER  Has patient had a PCN reaction causing immediate rash, facial/tongue/throat swelling, SOB or lightheadedness with hypotension: No Has patient had a PCN reaction causing SEVERE RASH INVOLVING MUCUS MEMBRANES or SKIN NECROSIS  #  #  #  YES  #  #  #  Has patient had a PCN reaction that required hospitalization: Unk Has patient had a PCN reaction occurring within the last 10 years: Unk If all of the above answers are "NO", then may proceed with Cephalosporin use.   [name] Lisinopril Other (See Comments)    Felt like she had the flu. Was very sick!!!  . Adhesive [Tape] Rash    bruising    Chief Complaint  Patient presents with  . Hospitalization Follow-up    HPI:  She has been hospitalized for acute on chronic respiratory failure related to pneumonia; acute on chronic congestive heart failure; new hemodialysis. She is here for short term rehab with her goal to return back home. She is not voicing any complaints at this time and happy to be out of the hospital. There are no nursing concerns at this time.   Past Medical History:  Diagnosis Date  . Anemia   . Anginal pain ([place])   . Anxiety   . Arthritis    "stiff fingers and knees" (08/04/2013), (12/12/2014)  . Asthma   . CAD (coronary artery disease) 2002; 2015   CABG x 6 2002, cath 2011- med Rx stent DES VG-Diag  . CAD (coronary artery disease) of artery bypass graft; DES to VG-Diag 09/28/13 11/09/2013  . Cataract   . CHF (congestive heart failure) ([place])    "in 2002" (11/26/2012)  . Chronic bronchitis ([place])    "q year; in the winter"   . CKD (chronic kidney disease)    stage 4,  followed by [facility]  . Coronary artery disease 2002   CABG x 6. Cath 5/11- med Rx  . GERD (gastroesophageal reflux disease)   . Gout    "right big toe"  . Headache    "~ q week" (08/04/2013); "~ twice/month" (12/12/2014)  . History of blood transfusion 2002   "when I had OHS"  . Hyperlipidemia   . Hypertension   . Hypothyroid    treated  . Migraines    "couple times/year" (08/04/2013), (12/12/2014)  . Myocardial infarction 2000; 2002; 2011  . Obesity (BMI 35.0-39.9 without comorbidity)   . Peripheral vascular disease ([place]) 12/12   LSFA PTA  . Pneumonia    "3 times I think" (12/12/2014)  . Type II diabetes mellitus ([place])     Past Surgical History:  Procedure Laterality Date  . ABDOMINAL AORTAGRAM N/A 04/05/2011   Procedure: ABDOMINAL AORTAGRAM;  Surgeon: Lorretta Harp, MD;  Location: [facility] CATH LAB;  Service: Cardiovascular;  Laterality: N/A;  . APPENDECTOMY  1980  . AV FISTULA PLACEMENT Left 05/10/2016   Procedure: LEFT ARM ARTERIOVENOUS (AV) FISTULA CREATION;  Surgeon: Rosetta Posner, MD;  Location: [place];  Service: Vascular;  Laterality: Left;  . BREAST CYST EXCISION Right 1970's  . CARDIAC CATHETERIZATION  2002  .  CARDIAC CATHETERIZATION N/A 12/12/2014   Procedure: Left Heart Cath and Cors/Grafts Angiography;  Surgeon: Peter M Martinique, MD;  Location: [place] CV LAB;  Service: Cardiovascular;  Laterality: N/A;  . CARDIAC CATHETERIZATION N/A 12/12/2014   Procedure: Coronary Stent Intervention;  Surgeon: Peter M Martinique, MD;  Location: [place] CV LAB;  Service: Cardiovascular;  Laterality: N/A;  . [place]; 1980  . CHOLECYSTECTOMY  1982  . CORONARY ANGIOPLASTY WITH STENT PLACEMENT  2004; 2012   "I have 2 stents" (08/04/2013)  . CORONARY ANGIOPLASTY WITH STENT PLACEMENT  09/28/13   PTCA/ DES Xience stent to VG-Diag   . CORONARY ARTERY BYPASS GRAFT  11/20/2000   x6 LIMA to distal LAD, svg to first diag, svg to ramus intermediate branch and swquential SVG to  cir marginal branch, SVG to posterior descending coronary and sequential SVG to first right posterolateral branch  . INSERTION OF DIALYSIS CATHETER Right 05/10/2016   Procedure: INSERTION OF DIALYSIS CATHETER - Right Internal Jugular Placement;  Surgeon: Rosetta Posner, MD;  Location: [place];  Service: Vascular;  Laterality: Right;  . LEFT HEART CATHETERIZATION WITH CORONARY/GRAFT ANGIOGRAM N/A 09/28/2013   Procedure: LEFT HEART CATHETERIZATION WITH Beatrix Fetters;  Surgeon: Peter M Martinique, MD;  Location: [facility] CATH LAB;  Service: Cardiovascular;  Laterality: N/A;  . LOWER EXTREMITY ANGIOGRAM  12/01/2012   Procedure: LOWER EXTREMITY ANGIOGRAM;  Surgeon: Lorretta Harp, MD;  Location: [facility] CATH LAB;  Service: Cardiovascular;;  . NM MYOCAR PERF WALL MOTION  08/27/2004   negative  . PERCUTANEOUS STENT INTERVENTION Left 12/01/2012   Procedure: PERCUTANEOUS STENT INTERVENTION;  Surgeon: Lorretta Harp, MD;  Location: [facility] CATH LAB;  Service: Cardiovascular;  Laterality: Left;  Left SFA  . PERIPHERAL ARTERIAL STENT GRAFT Left    SFA/notes 04/07/2011 (11/30/2012)  . RENAL ANGIOGRAM N/A 04/05/2011   Procedure: RENAL ANGIOGRAM;  Surgeon: Lorretta Harp, MD;  Location[facility] CATH LAB;  Service: Cardiovascular;  Laterality: N/A;  . TUBAL LIGATION  1980    Social History   Social History  . Marital status: Divorced    Spouse name: N/A  . Number of children: 2  . Years of education: 14   Occupational History  . Disabled    Social History Main Topics  . Smoking status: Former Smoker    Packs/day: 0.00    Years: 25.00    Types: Cigarettes    Quit date: 04/04/2000  . Smokeless tobacco: Never Used  . Alcohol use No     Comment: 12/12/2014  "have a glass of red wine on my birthday q yr; that's it"  . Drug use: No  . Sexual activity: Not Currently    Birth control/ protection: Abstinence   Other Topics Concern  . Not on file   Social History Narrative   Lives at home with a roommate.    Right-handed.   Occasional caffeine use.   Her 42 year son was shot to death in 08-27-2015.   Family History  Problem Relation Age of Onset  . Diabetes Mother   . Hypertension Mother   . Stroke Mother   . Hypertension Father   . Hypertension Brother   . Hypertension Sister   . Diabetes Sister   . Hyperlipidemia Sister       VITAL SIGNS BP (!) 134/52   Pulse (!) 52   Temp 98.2 F (36.8 C)   Resp 18   Ht 5\' 10"  (1.778 m)   Wt 210 lb (95.3  kg)   LMP 10/13/2014 (Exact Date)   SpO2 96%   BMI 30.13 kg/m   Patient's Medications  New Prescriptions   No medications on file  Previous Medications   ACETAMINOPHEN-CODEINE (TYLENOL #3) 300-30 MG TABLET    Take 1 tablet by mouth 2 (two) times daily as needed for moderate pain.   ALBUTEROL (PROVENTIL HFA;VENTOLIN HFA) 108 (90 BASE) MCG/ACT INHALER    Inhale 2 puffs into the lungs every 6 (six) hours as needed for wheezing or shortness of breath.   ALBUTEROL (PROVENTIL) (2.5 MG/3ML) 0.083% NEBULIZER SOLUTION    Take 3 mLs (2.5 mg total) by nebulization every 6 (six) hours as needed for wheezing or shortness of breath.   ALLOPURINOL (ZYLOPRIM) 300 MG TABLET    Take 1 tablet (300 mg total) by mouth daily.   AMLODIPINE (NORVASC) 10 MG TABLET    take 1 tablet by mouth once daily   ASPIRIN 81 MG CHEWABLE TABLET    Chew 1 tablet (81 mg total) by mouth daily.   ATORVASTATIN (LIPITOR) 40 MG TABLET    Take 1 tablet (40 mg total) by mouth daily.   BENZONATATE (TESSALON) 100 MG CAPSULE    Take 100 mg by mouth 3 (three) times daily as needed for cough.   CALCITRIOL (ROCALTROL) 0.25 MCG CAPSULE    Take 0.25 mcg by mouth daily.   CLONIDINE (CATAPRES) 0.2 MG TABLET    Take 1 tablet (0.2 mg total) by mouth 3 (three) times daily.   CLOPIDOGREL (PLAVIX) 75 MG TABLET    Take 1 tablet (75 mg total) by mouth daily.   DARBEPOETIN ALFA (ARANESP) 200 MCG/0.4ML SOSY INJECTION    Inject 0.4 mLs (200 mcg total) into the vein every Tuesday with hemodialysis.    EZETIMIBE (ZETIA) 10 MG TABLET    Take 1 tablet (10 mg total) by mouth daily.   FERRIC GLUCONATE 125 MG IN SODIUM CHLORIDE 0.9 % 100 ML    Inject 125 mg into the vein Every Tuesday,Thursday,and Saturday with dialysis.   FLUTICASONE (FLONASE) 50 MCG/ACT NASAL SPRAY    Place 2 sprays into both nostrils daily as needed for allergies or rhinitis.   FLUTICASONE-SALMETEROL (ADVAIR DISKUS) 250-50 MCG/DOSE AEPB    Inhale 1 puff into the lungs 2 (two) times daily.   GABAPENTIN (NEURONTIN) 400 MG CAPSULE    Take 400 mg by mouth 2 (two) times daily.   HYDROXYZINE (ATARAX/VISTARIL) 25 MG TABLET    Take 1 tablet (25 mg total) by mouth 3 (three) times daily.   INSULIN ASPART (NOVOLOG) 100 UNIT/ML INJECTION    Inject 15-20 Units into the skin 3 (three) times daily before meals. Inject as per sliding scale: 0-150=0 units, 151+=5 units    INSULIN GLARGINE (TOUJEO SOLOSTAR) 300 UNIT/ML SOPN    Inject 30 Units into the skin at bedtime.   ISOSORBIDE MONONITRATE (IMDUR) 60 MG 24 HR TABLET    Take 60 mg by mouth daily.   LEVOTHYROXINE (SYNTHROID, LEVOTHROID) 25 MCG TABLET    take 1 tablet by mouth every morning BEFORE BREAKFAST   LORATADINE (CLARITIN) 10 MG TABLET    Take 10 mg by mouth daily.   METOPROLOL TARTRATE (LOPRESSOR) 25 MG TABLET    Take 1 tablet (25 mg total) by mouth 2 (two) times daily.   NAPHAZOLINE HCL (CLEAR EYES OP)    Place 1 drop into both eyes as needed (for dry eyes).   NAPROXEN (NAPROSYN) 500 MG TABLET    Take 500 mg by mouth  daily as needed for moderate pain.   NITROGLYCERIN (NITROSTAT) 0.4 MG SL TABLET    Place 1 tablet (0.4 mg total) under the tongue every 5 (five) minutes as needed.   PREDNISOLONE ACETATE (PRED FORTE) 1 % OPHTHALMIC SUSPENSION    Place 1 drop into both eyes 2 (two) times daily.    RANITIDINE (ZANTAC 75) 75 MG TABLET    Take 1 tablet (75 mg total) by mouth 2 (two) times daily.   SENNA-DOCUSATE (SENOKOT-S) 8.6-50 MG TABLET    Take 1 tablet by mouth at bedtime as needed for mild  constipation.   SEVELAMER CARBONATE (RENVELA) 2.4 G PACK    Take 2.4 g by mouth 3 (three) times daily with meals.   TRIAMCINOLONE OINTMENT (KENALOG) 0.5 %    Apply 1 application topically 2 (two) times daily.  Modified Medications   No medications on file  Discontinued Medications   No medications on file     SIGNIFICANT DIAGNOSTIC EXAMS  05-04-16: 2-d echo: - Left ventricle: The cavity size was mildly dilated. Wall thickness was normal. Systolic function was moderately reduced. The estimated ejection fraction was in the range of 35% to 40%. Hypokinesis of the inferolateral, inferior, and inferoseptal myocardium. Features are consistent with a pseudonormal left ventricular filling pattern, with concomitant abnormal relaxation and increased filling pressure (grade 2 diastolic dysfunction). - Mitral valve: There was moderate regurgitation. - Left atrium: The atrium was moderately dilated. - Pulmonary arteries: PA peak pressure: 38 mm Hg (S).  05-10-16: chest x-ray: Right jugular dialysis catheter tip at the cavoatrial junction an no pneumothorax. Cardiomegaly.   LABS REVIEWED:   05-02-16: wbc 7.9; hgb 11.9; hct 35.8; mcv 93.2 plt 184; glucose 190; bun 35; creat 3.92; k+ 3.4; na++ 134;  05-03-16; wbc 15.6; hgb 10.7; hct 33.2; mcv 93.5; plt 175; BNP 2093.8; hgb a1c 5.7 05-04-16: glucose 310; bun 58; creat 4.73; k+ 4.8; na++ 138; mag 2.4; phos 5.3  05-06-16; wbc 7.1; hgb 7.5; hct 23.5; mcv 95.1; plt 121; glucose 352; bun 83; creat 5.85; k+ 4.4; na++ 139; PTH 542; iron 25; tibc 202 05-09-16: wbc 12.1; hgb 9.8; hct 30.8; mcv 96.3; plt 265; glucose 126; bun 36; creat 2.83; k+ 4.5; na++ 141; phos 5.1; albumin 2.6 05-14-16: wbc 10.;6 hgb 9.7; hct 29.6; mcv 92.5; plt 286; glucose 240 bun 67; creat 6.02; k+ 3.4; na++ 135; phos 8.9; albumin 3.0     Review of Systems  Constitutional: Negative for malaise/fatigue.  Respiratory: Negative for cough and shortness of breath.   Cardiovascular: Negative for  chest pain, palpitations and leg swelling.  Gastrointestinal: Negative for abdominal pain, constipation and heartburn.  Musculoskeletal: Negative for back pain, joint pain and myalgias.  Skin: Negative.   Neurological: Negative for dizziness.  Psychiatric/Behavioral: The patient is not nervous/anxious.     Physical Exam  Constitutional: No distress.  Overweight   Eyes: Conjunctivae are normal.  Neck: Neck supple. No JVD present. No thyromegaly present.  Cardiovascular: Normal rate, regular rhythm and intact distal pulses.   Murmur heard. 1/6  Respiratory: Effort normal and breath sounds normal. No respiratory distress. She has no wheezes.  GI: Soft. Bowel sounds are normal. She exhibits no distension. There is no tenderness.  Musculoskeletal: She exhibits no edema.  Able to move all extremities   Lymphadenopathy:    She has no cervical adenopathy.  Neurological: She is alert.  Skin: Skin is warm and dry. She is not diaphoretic.  Left arm a/v fistula: + thrill + bruit Right  chest wall permacath   Psychiatric: She has a normal mood and affect.      ASSESSMENT/ PLAN:  1. Acute on chronic combined systolic and diastolic hear failure: EF 35-40% (05-04-16) will continue lopressor 25 mg twice daily   2. Hypertension: will continue norvasc 10 mg daily lopressor 25 mg twice daily clonidine 0.2 mg three times daily   3. CAD has unstable angina: status post cabg (2002) and pci (09/2013 and 12-14-14): will continue asa 81 mg daily plavix 75 mg daily imdur 60 mg daily and prn ntg    4. Dyslipidemia: will continue lipitor 40 mg daily zetia 10 mg daily   5. Hypothyroidism: will continue synthroid 25 mcg daily   6. Gout: no reports of recent flares: will continue allopurinol 300 mg daily   7. COPD: will continue advair 250/50 twice daily claritin 10 mg daily albuterol 2 puffs or neb every 6 hours as needed   8. Diabetes: hgb a1c 5.7 will continue toujeo 30 units nightly and novolog 5  units with meals   9. ESRD on hemodialysis: is followed by nephrology will continue hemodialysis 3 days per week; take atarax 25 mg three times daily for itching; renvela 2.4 gm three times daily calcitriol 0.25 mcg daily   10. Diabetic neuropathy: will continue neurontin 400 mg twice daily   11. Anemia: will continue aranesp weekly at dialysis and ferric gluconate IV three times weekly at hemodialysis    Time spent with patient  50  minutes >50% time spent counseling; reviewing medical record; tests; labs; and developing future plan of care     MD is aware of resident's narcotic use and is in agreement with current plan of care. We will attempt to wean resident as apropriate   Ok Edwards NP [facility] Adult [facility]  Contact [phone] Monday through Friday 8am- 5pm  After hours call [phone]

## 2016-06-17 NOTE — Patient Outreach (Addendum)
[name]) Care Management  06/17/2016  Brooke Stafford 04-25-1955 [id number]   Assessment-CSW completed outreach attempt today. CSW unable to reach patient successfully. CSW left a HIPPA compliant voice message encouraging patient to return call once available.  Plan-CSW will await return call or complete an additional outreach if needed within one week.  [name], BSW, MSW, [email or web address] Phone: [phone] Fax: [phone]

## 2016-06-18 ENCOUNTER — Other Ambulatory Visit: Payer: Self-pay

## 2016-06-18 NOTE — Patient Outreach (Signed)
[facility]) Care Management  [place]  06/18/2016   Brooke Stafford 1955/08/22 [id number]  Subjective:  I am sorry, I forgot.  (patient response to not being home for scheduled home visit.)  Objective:  Patient was not at home for home visit. Patient later called to reschedule home visit.   Encounter Medications:  Outpatient Encounter Prescriptions as of 06/18/2016  Medication Sig  . acetaminophen-codeine (TYLENOL #3) 300-30 MG tablet Take 1 tablet by mouth 2 (two) times daily as needed for moderate pain.  [name] albuterol (PROVENTIL HFA;VENTOLIN HFA) 108 (90 Base) MCG/ACT inhaler Inhale 2 puffs into the lungs every 6 (six) hours as needed for wheezing or shortness of breath.  [name] albuterol (PROVENTIL) (2.5 MG/3ML) 0.083% nebulizer solution Take 3 mLs (2.5 mg total) by nebulization every 6 (six) hours as needed for wheezing or shortness of breath.  . allopurinol (ZYLOPRIM) 300 MG tablet Take 1 tablet (300 mg total) by mouth daily.  [name] amLODipine (NORVASC) 10 MG tablet take 1 tablet by mouth once daily (Patient taking differently: take 10mg s once daily)  . aspirin 81 MG chewable tablet Chew 1 tablet (81 mg total) by mouth daily.  [name] atorvastatin (LIPITOR) 40 MG tablet Take 1 tablet (40 mg total) by mouth daily. (Patient taking differently: Take 40 mg by mouth every evening. )  . benzonatate (TESSALON) 100 MG capsule Take 100 mg by mouth 3 (three) times daily as needed for cough.  . calcitRIOL (ROCALTROL) 0.25 MCG capsule Take 0.25 mcg by mouth daily.  . cloNIDine (CATAPRES) 0.2 MG tablet Take 1 tablet (0.2 mg total) by mouth 3 (three) times daily.  . clopidogrel (PLAVIX) 75 MG tablet Take 1 tablet (75 mg total) by mouth daily.  . Darbepoetin Alfa (ARANESP) 200 MCG/0.4ML SOSY injection Inject 0.4 mLs (200 mcg total) into the vein every Tuesday with hemodialysis.  [name] ezetimibe (ZETIA) 10 MG tablet Take 1 tablet (10 mg total) by mouth daily.  . ferric gluconate 125 mg in  sodium chloride 0.9 % 100 mL Inject 125 mg into the vein Every Tuesday,Thursday,and Saturday with dialysis.  . fluticasone (FLONASE) 50 MCG/ACT nasal spray Place 2 sprays into both nostrils daily as needed for allergies or rhinitis.  . Fluticasone-Salmeterol (ADVAIR DISKUS) 250-50 MCG/DOSE AEPB Inhale 1 puff into the lungs 2 (two) times daily. (Patient not taking: Reported on 06/17/2016)  . gabapentin (NEURONTIN) 400 MG capsule Take 400 mg by mouth 2 (two) times daily.  . hydrOXYzine (ATARAX/VISTARIL) 25 MG tablet Take 1 tablet (25 mg total) by mouth 3 (three) times daily. (Patient taking differently: Take 25 mg by mouth daily as needed for anxiety or itching. )  . insulin aspart (NOVOLOG) 100 UNIT/ML injection Inject 15-20 Units into the skin 3 (three) times daily before meals. Inject as per sliding scale: 0-150=0 units, 151+=5 units   . Insulin Glargine (TOUJEO SOLOSTAR) 300 UNIT/ML SOPN Inject 30 Units into the skin at bedtime. (Patient taking differently: Inject 25 Units into the skin at bedtime. )  . isosorbide mononitrate (IMDUR) 60 MG 24 hr tablet Take 60 mg by mouth daily.  [name] levothyroxine (SYNTHROID, LEVOTHROID) 25 MCG tablet take 1 tablet by mouth every morning BEFORE BREAKFAST (Patient taking differently: take 76mcg by mouth every morning BEFORE BREAKFAST)  . loratadine (CLARITIN) 10 MG tablet Take 10 mg by mouth daily.  . metoprolol tartrate (LOPRESSOR) 25 MG tablet Take 1 tablet (25 mg total) by mouth 2 (two) times daily.  . Naphazoline HCl (CLEAR EYES OP)  Place 1 drop into both eyes as needed (for dry eyes).  . naproxen (NAPROSYN) 500 MG tablet Take 500 mg by mouth daily as needed for moderate pain.  . nitroGLYCERIN (NITROSTAT) 0.4 MG SL tablet Place 1 tablet (0.4 mg total) under the tongue every 5 (five) minutes as needed. (Patient taking differently: Place 0.4 mg under the tongue every 5 (five) minutes as needed for chest pain. )  . prednisoLONE acetate (PRED FORTE) 1 % ophthalmic  suspension Place 1 drop into both eyes 2 (two) times daily.   . ranitidine (ZANTAC 75) 75 MG tablet Take 1 tablet (75 mg total) by mouth 2 (two) times daily. (Patient taking differently: Take 75 mg by mouth daily as needed for heartburn. )  . senna-docusate (SENOKOT-S) 8.6-50 MG tablet Take 1 tablet by mouth at bedtime as needed for mild constipation.  . sevelamer (RENAGEL) 800 MG tablet Take 2,400 mg by mouth 3 (three) times daily with meals.  . sevelamer carbonate (RENVELA) 2.4 g PACK Take 2.4 g by mouth 3 (three) times daily with meals. (Patient not taking: Reported on 06/17/2016)  . triamcinolone ointment (KENALOG) 0.5 % Apply 1 application topically 2 (two) times daily. (Patient taking differently: Apply 1 application topically daily. )   No facility-administered encounter medications on file as of 06/18/2016.     Functional Status:  In your present state of health, do you have any difficulty performing the following activities: 05/15/2016 05/04/2016  Hearing? N N  Vision? - N  Difficulty concentrating or making decisions? - N  Walking or climbing stairs? - Y  Dressing or bathing? - N  Doing errands, shopping? - Facilities manager and eating ? - -  Using the Toilet? - -  In the past six months, have you accidently leaked urine? - -  Do you have problems with loss of bowel control? - -  Managing your Medications? - -  Managing your Finances? - -  Housekeeping or managing your Housekeeping? - -  Some recent data might be hidden    Fall/Depression Screening: PHQ 2/9 Scores 05/15/2016 04/26/2016 04/03/2016 03/11/2016 02/23/2016 01/11/2016 01/04/2016  PHQ - 2 Score 2 2 0 2 6 1 3   PHQ- 9 Score 8 9 - 6 27 - 12   Fall Risk  06/18/2016 05/15/2016 04/03/2016 02/14/2016 01/03/2016  Falls in the past year? Yes No Yes Yes Yes  Number falls in past yr: 2 or more - 2 or more 2 or more 2 or more  Injury with Fall? No - No Yes Yes  Risk Factor Category  High Fall Risk - High Fall Risk High Fall Risk High  Fall Risk  Risk for fall due to : History of fall(s);Impaired balance/gait;Impaired mobility;Impaired vision;Medication side effect - History of fall(s);Impaired balance/gait;Impaired mobility;Medication side effect - History of fall(s);Impaired balance/gait;Impaired mobility;Impaired vision;Medication side effect  Follow up Falls prevention discussed;Follow up appointment - Education provided;Follow up appointment - Follow up appointment   [facility] CM Care Plan Problem One   Flowsheet Row Most Recent Value  Care Plan Problem One  Patient had 2 recent hospitalizations for COPD exacerbation  Role Documenting the Problem One  Care Management Coordinator  Care Plan for Problem One  Active  [facility] Long Term Goal (31-90 days)  In the next 31 days, patient will have no acute care admissions for COPD exacerbation  [facility] Long Term Goal Start Date  06/18/16  Interventions for Problem One Long Term Goal  Initial home visit for assessment of community care  coordination needs  [facility] CM Short Term Goal #1 (0-30 days)  In the next 14 days, patient will meet with [place] for COPD education, need for community care coordination  [facility] CM Short Term Goal #1 Start Date  06/18/16  Interventions for Short Term Goal #1  initial home visit, however, patient was not at home.    [facility] CM Care Plan Problem Two   Flowsheet Row Most Recent Value  Care Plan Problem Two  Patient lives in home with mold and mildew  Role Documenting the Problem Two  Care Management Coordinator  Care Plan for Problem Two  Active  [facility] CM Short Term Goal #1 (0-30 days)  In the next 21 days, patient will meet with [facility] RNCM for review of community resources for  rental living  [facility] CM Short Term Goal #1 Start Date  06/18/16  Interventions for Short Term Goal #2   home visit, however, patient was not at home      Assessment:  Patient was not at home for this home visit, however, patient did contact this RNCM later to reschedule home visit. Patient stated  she forgot the home visit.  Plan:  Home visit with this RNCM in 21 days for assessment of patient's progress towards meeting her case mangement goals.

## 2016-06-19 DIAGNOSIS — E1129 Type 2 diabetes mellitus with other diabetic kidney complication: Secondary | ICD-10-CM | POA: Diagnosis not present

## 2016-06-19 DIAGNOSIS — R509 Fever, unspecified: Secondary | ICD-10-CM | POA: Diagnosis not present

## 2016-06-19 DIAGNOSIS — N186 End stage renal disease: Secondary | ICD-10-CM | POA: Diagnosis not present

## 2016-06-19 DIAGNOSIS — N2581 Secondary hyperparathyroidism of renal origin: Secondary | ICD-10-CM | POA: Diagnosis not present

## 2016-06-19 DIAGNOSIS — D509 Iron deficiency anemia, unspecified: Secondary | ICD-10-CM | POA: Diagnosis not present

## 2016-06-20 ENCOUNTER — Ambulatory Visit: Payer: Medicare Other | Admitting: Internal Medicine

## 2016-06-21 DIAGNOSIS — E1129 Type 2 diabetes mellitus with other diabetic kidney complication: Secondary | ICD-10-CM | POA: Diagnosis not present

## 2016-06-21 DIAGNOSIS — N186 End stage renal disease: Secondary | ICD-10-CM | POA: Diagnosis not present

## 2016-06-21 DIAGNOSIS — D509 Iron deficiency anemia, unspecified: Secondary | ICD-10-CM | POA: Diagnosis not present

## 2016-06-21 DIAGNOSIS — N2581 Secondary hyperparathyroidism of renal origin: Secondary | ICD-10-CM | POA: Diagnosis not present

## 2016-06-21 DIAGNOSIS — R509 Fever, unspecified: Secondary | ICD-10-CM | POA: Diagnosis not present

## 2016-06-24 ENCOUNTER — Other Ambulatory Visit: Payer: Self-pay | Admitting: Licensed Clinical Social Worker

## 2016-06-24 DIAGNOSIS — N186 End stage renal disease: Secondary | ICD-10-CM | POA: Diagnosis not present

## 2016-06-24 DIAGNOSIS — E1129 Type 2 diabetes mellitus with other diabetic kidney complication: Secondary | ICD-10-CM | POA: Diagnosis not present

## 2016-06-24 DIAGNOSIS — N2581 Secondary hyperparathyroidism of renal origin: Secondary | ICD-10-CM | POA: Diagnosis not present

## 2016-06-24 DIAGNOSIS — R509 Fever, unspecified: Secondary | ICD-10-CM | POA: Diagnosis not present

## 2016-06-24 DIAGNOSIS — D509 Iron deficiency anemia, unspecified: Secondary | ICD-10-CM | POA: Diagnosis not present

## 2016-06-24 NOTE — Patient Outreach (Signed)
[facility]) Care Management  06/24/2016  Brooke Stafford [birth year] [id number]   Assessment-CSW completed outreach attempt today. CSW unable to reach patient successfully in order to schedule home visit. CSW left a HIPPA compliant voice message encouraging patient to return call once available.  Plan-CSW will await return call or complete an additional outreach if needed.  [name], BSW, MSW, [email or web address] Phone: [phone] Fax: [phone]

## 2016-06-25 ENCOUNTER — Other Ambulatory Visit: Payer: Self-pay | Admitting: Licensed Clinical Social Worker

## 2016-06-25 ENCOUNTER — Other Ambulatory Visit: Payer: Self-pay

## 2016-06-25 ENCOUNTER — Encounter (HOSPITAL_COMMUNITY): Payer: Self-pay | Admitting: *Deleted

## 2016-06-25 NOTE — Progress Notes (Signed)
Anesthesia Chart Review: SAME DAY WORK-UP.  Patient is a 61 year old female scheduled for second stage left basilic vein transposition on 06/26/16 by Dr. Donnetta Hutching. Anesthesia is posted as Choice. She is s/p right IR tunneled HD catheter and first stage left BVT 05/10/16 under MAC.   History includes ESRD (HD MWF), CAD/MI s/p CABG (LIMA-LAD, SVG-DIAG1, SVG-RAMUS-OM, SVG-PDA-1RPL) 11/20/00; SVG-Ramus occluded 10/04/04 s/p Taxus stent-Ramus; s/p DES SVG-DIAG 09/28/13; s/p DES Ramus 12/12/14, CHF, PAD s/p left SFA stent 04/05/11 and 12/01/12, former smoker, COPD, asthma, anxiety, HTN, CVA (mild right sided weakness), GERD, HLD, anemia, hypothyroidism, DM2, migraines, cholecystectomy, appendectomy. Admission 1/8-1/30/18 for acute hypoxic respiratory failure (due to pneumonia and acute on chronic CHF with underlying COPD) requiring BiPAP then intubation. She also had known CKD V and required hemodialysis access with initiation of HD that admission for acute on chronic CHF.  EF dropped to 35-40% from 40-45% 12/2015 and 50-55% 07/2015. Last cardiology noted seen 9/29017. She was discharged to SNF.  - PCP is Dr. Arnoldo Morale with [facility]. - Nephrologist is Dr. Jamal Maes. - Endocrinologist is Dr. Philemon Kingdom. - Cardiologist is Dr. Sanda Klein, last visit 09/22/15. Last cardiology visit was with Dr. Dorris Carnes during 12/2015 admission for acute on chronic CHF.   Meds (as documented from 06/17/16) include Tylenol No. 3, albuterol, allopurinol, amlodipine, aspirin 81 mg, Lipitor, calcitriol, clonidine, Plavix, Aranesp, Zetia, ferric gluconate, Flonase, Neurontin, NovoLog, insulin glargine, Imdur, levothyroxine, Claritin, nitroglycerin, Pred forte ophthalmic, Zantac. Lopressor is listed as "not taking." Per [facility], patient may continue Plavix preoperatively.   EKG 05/03/16: ST at 101 bpm, occasional PVC, possible LAE, ST/T wave abnormality, consider lateral ischemia. Baseline wanderer.  ST/T wave abnormality more pronounced when compared to April 26, 2016 tracing.   Echo 05/04/16 (ordered by PCCM for ARF/intubation): Study Conclusions - Left ventricle: The cavity size was mildly dilated. Wall   thickness was normal. Systolic function was moderately reduced.   The estimated ejection fraction was in the range of 35% to 40%.   Hypokinesis of the inferolateral, inferior, and inferoseptal   myocardium. Features are consistent with a pseudonormal left   ventricular filling pattern, with concomitant abnormal relaxation   and increased filling pressure (grade 2 diastolic dysfunction). - Mitral valve: There was moderate regurgitation. - Left atrium: The atrium was moderately dilated. - Pulmonary arteries: PA peak pressure: 38 mm Hg (S). (Comparison EF 40-45% 12/19/15, 50-55% 07/27/15, 35-40% 11/14/14.)  Cardiac cath 12/12/14 (LHC done due to new decrease in LVF): Conclusions:  Prox RCA to Mid RCA lesion, 80% stenosed.  Dist RCA lesion, 95% stenosed.  was injected is normal in caliber.  The graft exhibits minimal luminal irregularities.  Ost LAD to Prox LAD lesion, 100% stenosed.  SVG is normal in caliber, and is anatomically normal.  A drug-eluting stent was placed in past.  LIMA is normal in caliber, and is anatomically normal.  Mid Cx to Dist Cx lesion, 30% stenosed.  LM lesion, 40% stenosed.  Ramus lesion, 90% stenosed. There is a 0% residual stenosis post intervention. The lesion was previously treated with a stent (unknown type) greater than two years ago.  A drug-eluting stent was placed. 1. Severe 3 vessel obstructive CAD 2. Patent LIMA to LAD 3. Patent SVG to first diagonal- it also fills second diagonal. Stent in SVG is patent. 4. Patent SVG to PDA 5. Occluded SVG to ramus intermediate. Chronic 6. Successful stenting of the proximal ramus intermediate with a DES.  7. Elevated LV  EDP Plan: Continue DAPT for at least one year. Will increase lasix to 40 mg daily. I  do not think that the ramus intermediate stenosis would explain her decrease in EF. Need to maximize medical therapy for CHF  1V CXR 05/10/16: IMPRESSION: Right jugular dialysis catheter tip at the cavoatrial junction and no pneumothorax. Cardiomegaly.  PFTs 04/22/16: FVC 1.73 (51%), FEV1 1.35 (50%), DLCOunc 11.56 (35%).  She is for ISTAT on arrival.   Above reviewed with anesthesiologist Dr. Conrad Goldville. If no acute CV/CHF symptoms and labs acceptable then it is anticipated that she can proceed as planned.  George Hugh [facility] Short Stay Center/Anesthesiology Phone [phone] 06/25/2016 3:03 PM

## 2016-06-25 NOTE — Anesthesia Preprocedure Evaluation (Signed)
Anesthesia Evaluation    Airway        Dental   Pulmonary former smoker,           Cardiovascular hypertension, + angina + CAD, + Past MI, + Peripheral Vascular Disease and +CHF       Neuro/Psych  Headaches, PSYCHIATRIC DISORDERS Anxiety Depression CVA    GI/Hepatic GERD  ,  Endo/Other  diabetesHypothyroidism   Renal/GU DialysisRenal disease     Musculoskeletal  (+) Arthritis ,   Abdominal   Peds  Hematology  (+) anemia ,   Anesthesia Other Findings   Reproductive/Obstetrics                             Anesthesia Physical Anesthesia Plan  ASA: III  Anesthesia Plan: MAC   Post-op Pain Management:    Induction:   Airway Management Planned: Simple Face Mask  Additional Equipment:   Intra-op Plan:   Post-operative Plan:   Informed Consent:   Plan Discussed with: CRNA  Anesthesia Plan Comments: (Echo (12/2015) - Left ventricle: The cavity size was normal. Wall thickness was   normal. Systolic function was mildly to moderately reduced. The   estimated ejection fraction was in the range of 40% to 45%.   Diffuse hypokinesis. Doppler parameters are consistent with   abnormal left ventricular relaxation (grade 1 diastolic   dysfunction). Doppler parameters are consistent with high   ventricular filling pressure. - Aortic valve: There was trivial regurgitation. - Mitral valve: Calcified annulus. There was mild regurgitation. - Left atrium: The atrium was moderately dilated. )        Anesthesia Quick Evaluation

## 2016-06-25 NOTE — Anesthesia Preprocedure Evaluation (Addendum)
Anesthesia Evaluation    Airway Mallampati: II  TM Distance: >3 FB Neck ROM: Full    Dental  (+) Upper Dentures, Lower Dentures   Pulmonary asthma , pneumonia, resolved, COPD (on 02),  oxygen dependent, former smoker,    breath sounds clear to auscultation       Cardiovascular hypertension, + angina + CAD, + Past MI (s/p cabg), + Peripheral Vascular Disease and +CHF (05/05/15 ECHO: EF 35% to 40%, Hypokinesis of the inferolateral, inferior, and inferoseptal myocardium, mod MR)   Rhythm:Regular Rate:Normal     Neuro/Psych  Headaches, PSYCHIATRIC DISORDERS Anxiety Depression CVA    GI/Hepatic GERD  ,  Endo/Other  diabetesHypothyroidism   Renal/GU CRF and DialysisRenal disease     Musculoskeletal  (+) Arthritis ,   Abdominal   Peds  Hematology  (+) anemia ,   Anesthesia Other Findings   Reproductive/Obstetrics                           Anesthesia Physical Anesthesia Plan  ASA: IV  Anesthesia Plan: MAC   Post-op Pain Management:    Induction:   Airway Management Planned: Simple Face Mask  Additional Equipment:   Intra-op Plan:   Post-operative Plan:   Informed Consent: I have reviewed the patients History and Physical, chart, labs and discussed the procedure including the risks, benefits and alternatives for the proposed anesthesia with the patient or authorized representative who has indicated his/her understanding and acceptance.   Dental advisory given  Plan Discussed with: CRNA  Anesthesia Plan Comments: (Previous Anesthesia record reviewed. Potassium 4.1, Blood sugar 148. Patient was told to continue Plavix and Aspirin by surgeon.)      Anesthesia Quick Evaluation

## 2016-06-25 NOTE — Patient Outreach (Signed)
[facility]) Care Management  [facility] Social Work  06/25/2016  MERIDETH BOSQUE 1955/08/15 [id number]   Encounter Medications:  Outpatient Encounter Prescriptions as of 06/25/2016  Medication Sig  . acetaminophen-codeine (TYLENOL #3) 300-30 MG tablet Take 1 tablet by mouth 2 (two) times daily as needed for moderate pain.  [name] albuterol (PROVENTIL HFA;VENTOLIN HFA) 108 (90 Base) MCG/ACT inhaler Inhale 2 puffs into the lungs every 6 (six) hours as needed for wheezing or shortness of breath.  [name] albuterol (PROVENTIL) (2.5 MG/3ML) 0.083% nebulizer solution Take 3 mLs (2.5 mg total) by nebulization every 6 (six) hours as needed for wheezing or shortness of breath.  . allopurinol (ZYLOPRIM) 300 MG tablet Take 1 tablet (300 mg total) by mouth daily.  [name] amLODipine (NORVASC) 10 MG tablet take 1 tablet by mouth once daily (Patient taking differently: take 71ms once daily)  . aspirin 81 MG chewable tablet Chew 1 tablet (81 mg total) by mouth daily.  .Marland Kitchenatorvastatin (LIPITOR) 40 MG tablet Take 1 tablet (40 mg total) by mouth daily. (Patient taking differently: Take 40 mg by mouth every evening. )  . benzonatate (TESSALON) 100 MG capsule Take 100 mg by mouth 3 (three) times daily as needed for cough.  . calcitRIOL (ROCALTROL) 0.25 MCG capsule Take 0.25 mcg by mouth daily.  . cloNIDine (CATAPRES) 0.2 MG tablet Take 1 tablet (0.2 mg total) by mouth 3 (three) times daily.  . clopidogrel (PLAVIX) 75 MG tablet Take 1 tablet (75 mg total) by mouth daily.  . Darbepoetin Alfa (ARANESP) 200 MCG/0.4ML SOSY injection Inject 0.4 mLs (200 mcg total) into the vein every Tuesday with hemodialysis.  .Marland Kitchenezetimibe (ZETIA) 10 MG tablet Take 1 tablet (10 mg total) by mouth daily.  . ferric gluconate 125 mg in sodium chloride 0.9 % 100 mL Inject 125 mg into the vein Every Tuesday,Thursday,and Saturday with dialysis.  . fluticasone (FLONASE) 50 MCG/ACT nasal spray Place 2 sprays into both nostrils daily as needed  for allergies or rhinitis.  . Fluticasone-Salmeterol (ADVAIR DISKUS) 250-50 MCG/DOSE AEPB Inhale 1 puff into the lungs 2 (two) times daily. (Patient not taking: Reported on 06/17/2016)  . gabapentin (NEURONTIN) 400 MG capsule Take 400 mg by mouth 2 (two) times daily.  . hydrOXYzine (ATARAX/VISTARIL) 25 MG tablet Take 1 tablet (25 mg total) by mouth 3 (three) times daily. (Patient taking differently: Take 25 mg by mouth daily as needed for anxiety or itching. )  . insulin aspart (NOVOLOG) 100 UNIT/ML injection Inject 15-20 Units into the skin 3 (three) times daily before meals. Inject as per sliding scale: 0-150=0 units, 151+=5 units   . Insulin Glargine (TOUJEO SOLOSTAR) 300 UNIT/ML SOPN Inject 30 Units into the skin at bedtime. (Patient taking differently: Inject 25 Units into the skin at bedtime. )  . isosorbide mononitrate (IMDUR) 60 MG 24 hr tablet Take 60 mg by mouth daily.  .Marland Kitchenlevothyroxine (SYNTHROID, LEVOTHROID) 25 MCG tablet take 1 tablet by mouth every morning BEFORE BREAKFAST (Patient taking differently: take 218m by mouth every morning BEFORE BREAKFAST)  . loratadine (CLARITIN) 10 MG tablet Take 10 mg by mouth daily.  . metoprolol tartrate (LOPRESSOR) 25 MG tablet Take 1 tablet (25 mg total) by mouth 2 (two) times daily.  . Naphazoline HCl (CLEAR EYES OP) Place 1 drop into both eyes as needed (for dry eyes).  . naproxen (NAPROSYN) 500 MG tablet Take 500 mg by mouth daily as needed for moderate pain.  . nitroGLYCERIN (NITROSTAT) 0.4 MG SL tablet Place  1 tablet (0.4 mg total) under the tongue every 5 (five) minutes as needed. (Patient taking differently: Place 0.4 mg under the tongue every 5 (five) minutes as needed for chest pain. )  . prednisoLONE acetate (PRED FORTE) 1 % ophthalmic suspension Place 1 drop into both eyes 2 (two) times daily.   . ranitidine (ZANTAC 75) 75 MG tablet Take 1 tablet (75 mg total) by mouth 2 (two) times daily. (Patient taking differently: Take 75 mg by mouth daily  as needed for heartburn. )  . senna-docusate (SENOKOT-S) 8.6-50 MG tablet Take 1 tablet by mouth at bedtime as needed for mild constipation.  . sevelamer (RENAGEL) 800 MG tablet Take 2,400 mg by mouth 3 (three) times daily with meals.  . sevelamer carbonate (RENVELA) 2.4 g PACK Take 2.4 g by mouth 3 (three) times daily with meals. (Patient not taking: Reported on 06/17/2016)  . triamcinolone ointment (KENALOG) 0.5 % Apply 1 application topically 2 (two) times daily. (Patient taking differently: Apply 1 application topically daily. )   No facility-administered encounter medications on file as of 06/25/2016.     Functional Status:  In your present state of health, do you have any difficulty performing the following activities: 06/18/2016 05/15/2016  Hearing? N N  Vision? Y -  Difficulty concentrating or making decisions? N -  Walking or climbing stairs? Y -  Dressing or bathing? N -  Doing errands, shopping? Y -  Conservation officer, nature and eating ? N -  Using the Toilet? N -  In the past six months, have you accidently leaked urine? N -  Do you have problems with loss of bowel control? N -  Managing your Medications? N -  Managing your Finances? N -  Housekeeping or managing your Housekeeping? Y -  Some recent data might be hidden    Fall/Depression Screening:  PHQ 2/9 Scores 06/18/2016 05/15/2016 04/26/2016 04/03/2016 03/11/2016 02/23/2016 01/11/2016  PHQ - 2 Score 0 2 2 0 '2 6 1  ' PHQ- 9 Score - 8 9 - 6 27 -    Assessment: CSW completed routine home visit on 06/25/16. Patient reports that she is doing "very well." Patient is able to laugh and joke during today's session which is progress. Patient struggled with severe depression during her SNF stay but she reports that this has improved with the assistance of a new antidepressant. She reports that she continues to deal with the loss of her son but is doing so in a way that is positive and conducive for her. CSW provided education and encouragement for her  to consider going back to grief therapy with Hospice again. She reports that at this time she is doing well but will consider this as an option. She reports that her appetite is "good." Patient denies experiencing any chest pain or SOB. She states that she still has issues with sleeping but that "it has got better." Patient is currently taking 22m of citalopram but this is only a 30 day supply. She states that she forgot to schedule PCP appointment yesterday but will do so today and will discuss getting refill of antidepressant that is effectively working for her. Patient reports that her anxiety and depression are "under control" with new medication. Patient's walking has improved but she reports that she had two falls yesterday due to dizziness. She reports that this is a new issue for her. She states that this may a side effect of one of the new medications she is taking. Patient reports that both  her sister (who lives in [place]) and her brother (that lives in [place]) came to visit her this weekend. She states "I had a lot of fun spending time with them." CSW continues to encourage socialization for patient and reviewed available socialization opportunities within [facility] again. Patient reports that she has stable transportation to Dialysis through [facility]. Patient shares that the physician at the dialysis center has put her on the wait list for a Kidney Transplant and that she is having second thoughts about receiving one. [facility] CSW and RNCM offered her education and emotional support and encouraged her to speak to Kidney Transplant case manager about these concerns. Patient reports that she has not received anything in the mail from [employer] but will continue to check.  [facility] CM Care Plan Problem One   Flowsheet Row Most Recent Value  Care Plan Problem One  SNF admission and lack of resources  Role Documenting the Problem One  Clinical Social Worker  Care Plan for Problem One  Active  [facility] Long Term Goal  (31-90 days)  Patient will have a safe and stable discharge back home from SNF within 90 days per patient report  [place] Term Goal Start Date  05/15/16  Interventions for Problem One Long Term Goal  CSW completed SNF visit with family and provided resource education. CSW will coordinate care with patient, family and SNF d/c planner to ensure that she has all resources, services and medical equipment that she needs post discharge.  [facility] CM Short Term Goal #1 (0-30 days)  Patient will gain mental health resource information within 30 days and will be able to read back three resources per patient report during outreach  [facility] CM Short Term Goal #1 Start Date  05/15/16  [facility] CM Short Term Goal #1 Met Date  06/25/16  Interventions for Short Term Goal #1  Goal met.  [facility] CM Short Term Goal #2 (0-30 days)  Patient will implement 3 coping methods into her daily routine within 30 days per patient report  [facility] CM Short Term Goal #2 Start Date  06/25/16  Interventions for Short Term Goal #2  CSW has provided education on appropriate coping tools to alleviate depression. Patient is making progress with depression. Patient will consider going to grief therapy. CSW will continue to provide emotional support and monitor goal as needed.     Plan: CSW will follow up with patient within 30 days. CSW will continue to provide social work assistance and support.  [name], BSW, MSW, [email or web address] Phone: [phone] Fax: [phone]

## 2016-06-25 NOTE — Progress Notes (Signed)
Pt denies SOB and chest pain. Pt under the care of Dr. Sallyanne Kuster, Cardiology. Pt made aware to stop taking vitamins, fish oil and herbal medications. Do not take any NSAIDs ie: Ibuprofen, Advil, Naproxen, BC and Goody Powder. Pt stated that her fasting blood glucose ranges from 90-130's. Pt made aware of diabetes protocol to take 20 units of Toujeo insulin tonight, check BG every 2 hours prior to arrival to hospital the morning of procedure, interventions for BG < 70 ( drink 4 ounces of apple juice, wait 15 minutes and recheck BG after drinking juice, if BG remains < 70 call SS) and if BG > 220 take half of correction dose of Novolog insulin.  Pt verbalized understanding of all pre-op instructions. Anesthesia asked to review pt history.

## 2016-06-26 ENCOUNTER — Ambulatory Visit (HOSPITAL_COMMUNITY): Payer: Medicare Other | Admitting: Vascular Surgery

## 2016-06-26 ENCOUNTER — Telehealth: Payer: Self-pay | Admitting: Vascular Surgery

## 2016-06-26 ENCOUNTER — Encounter (HOSPITAL_COMMUNITY)
Admission: RE | Disposition: A | Discharge status: Home / Self Care | Payer: Self-pay | Source: Ambulatory Visit | Attending: Vascular Surgery

## 2016-06-26 ENCOUNTER — Ambulatory Visit (HOSPITAL_COMMUNITY)
Admission: RE | Admit: 2016-06-26 | Discharge: 2016-06-26 | Disposition: A | Discharge status: Home / Self Care | Payer: Medicare Other | Source: Ambulatory Visit | Attending: Vascular Surgery | Admitting: Vascular Surgery

## 2016-06-26 ENCOUNTER — Encounter (HOSPITAL_COMMUNITY): Payer: Self-pay | Admitting: Certified Registered Nurse Anesthetist

## 2016-06-26 DIAGNOSIS — Z880 Allergy status to penicillin: Secondary | ICD-10-CM | POA: Diagnosis not present

## 2016-06-26 DIAGNOSIS — Z992 Dependence on renal dialysis: Secondary | ICD-10-CM | POA: Insufficient documentation

## 2016-06-26 DIAGNOSIS — E785 Hyperlipidemia, unspecified: Secondary | ICD-10-CM | POA: Diagnosis not present

## 2016-06-26 DIAGNOSIS — E1151 Type 2 diabetes mellitus with diabetic peripheral angiopathy without gangrene: Secondary | ICD-10-CM | POA: Insufficient documentation

## 2016-06-26 DIAGNOSIS — D649 Anemia, unspecified: Secondary | ICD-10-CM | POA: Diagnosis not present

## 2016-06-26 DIAGNOSIS — I252 Old myocardial infarction: Secondary | ICD-10-CM | POA: Diagnosis not present

## 2016-06-26 DIAGNOSIS — I132 Hypertensive heart and chronic kidney disease with heart failure and with stage 5 chronic kidney disease, or end stage renal disease: Secondary | ICD-10-CM | POA: Diagnosis not present

## 2016-06-26 DIAGNOSIS — Z87891 Personal history of nicotine dependence: Secondary | ICD-10-CM | POA: Insufficient documentation

## 2016-06-26 DIAGNOSIS — N186 End stage renal disease: Secondary | ICD-10-CM | POA: Insufficient documentation

## 2016-06-26 DIAGNOSIS — J449 Chronic obstructive pulmonary disease, unspecified: Secondary | ICD-10-CM | POA: Insufficient documentation

## 2016-06-26 DIAGNOSIS — F4322 Adjustment disorder with anxiety: Secondary | ICD-10-CM

## 2016-06-26 DIAGNOSIS — Z951 Presence of aortocoronary bypass graft: Secondary | ICD-10-CM | POA: Insufficient documentation

## 2016-06-26 DIAGNOSIS — Z794 Long term (current) use of insulin: Secondary | ICD-10-CM | POA: Diagnosis not present

## 2016-06-26 DIAGNOSIS — Z9981 Dependence on supplemental oxygen: Secondary | ICD-10-CM | POA: Diagnosis not present

## 2016-06-26 DIAGNOSIS — Z955 Presence of coronary angioplasty implant and graft: Secondary | ICD-10-CM | POA: Insufficient documentation

## 2016-06-26 DIAGNOSIS — I69351 Hemiplegia and hemiparesis following cerebral infarction affecting right dominant side: Secondary | ICD-10-CM | POA: Diagnosis not present

## 2016-06-26 DIAGNOSIS — E119 Type 2 diabetes mellitus without complications: Secondary | ICD-10-CM | POA: Diagnosis not present

## 2016-06-26 DIAGNOSIS — Z888 Allergy status to other drugs, medicaments and biological substances status: Secondary | ICD-10-CM | POA: Insufficient documentation

## 2016-06-26 DIAGNOSIS — Z7982 Long term (current) use of aspirin: Secondary | ICD-10-CM | POA: Diagnosis not present

## 2016-06-26 DIAGNOSIS — I509 Heart failure, unspecified: Secondary | ICD-10-CM | POA: Insufficient documentation

## 2016-06-26 DIAGNOSIS — R21 Rash and other nonspecific skin eruption: Secondary | ICD-10-CM

## 2016-06-26 DIAGNOSIS — E039 Hypothyroidism, unspecified: Secondary | ICD-10-CM | POA: Diagnosis not present

## 2016-06-26 DIAGNOSIS — K219 Gastro-esophageal reflux disease without esophagitis: Secondary | ICD-10-CM | POA: Insufficient documentation

## 2016-06-26 DIAGNOSIS — Z79899 Other long term (current) drug therapy: Secondary | ICD-10-CM | POA: Diagnosis not present

## 2016-06-26 DIAGNOSIS — E1122 Type 2 diabetes mellitus with diabetic chronic kidney disease: Secondary | ICD-10-CM | POA: Diagnosis not present

## 2016-06-26 DIAGNOSIS — I5041 Acute combined systolic (congestive) and diastolic (congestive) heart failure: Secondary | ICD-10-CM | POA: Diagnosis not present

## 2016-06-26 DIAGNOSIS — Z7902 Long term (current) use of antithrombotics/antiplatelets: Secondary | ICD-10-CM | POA: Insufficient documentation

## 2016-06-26 DIAGNOSIS — I2510 Atherosclerotic heart disease of native coronary artery without angina pectoris: Secondary | ICD-10-CM | POA: Diagnosis not present

## 2016-06-26 DIAGNOSIS — I120 Hypertensive chronic kidney disease with stage 5 chronic kidney disease or end stage renal disease: Secondary | ICD-10-CM | POA: Diagnosis not present

## 2016-06-26 DIAGNOSIS — N185 Chronic kidney disease, stage 5: Secondary | ICD-10-CM | POA: Diagnosis not present

## 2016-06-26 HISTORY — DX: Type 2 diabetes mellitus with diabetic neuropathy, unspecified: E11.40

## 2016-06-26 HISTORY — PX: BASCILIC VEIN TRANSPOSITION: SHX5742

## 2016-06-26 HISTORY — DX: Cerebral infarction, unspecified: I63.9

## 2016-06-26 LAB — POCT I-STAT 4, (NA,K, GLUC, HGB,HCT)
Glucose, Bld: 148 mg/dL — ABNORMAL HIGH (ref 65–99)
HEMATOCRIT: 35 % — AB (ref 36.0–46.0)
Hemoglobin: 11.9 g/dL — ABNORMAL LOW (ref 12.0–15.0)
Potassium: 4.1 mmol/L (ref 3.5–5.1)
SODIUM: 137 mmol/L (ref 135–145)

## 2016-06-26 LAB — GLUCOSE, CAPILLARY
GLUCOSE-CAPILLARY: 138 mg/dL — AB (ref 65–99)
Glucose-Capillary: 115 mg/dL — ABNORMAL HIGH (ref 65–99)

## 2016-06-26 SURGERY — TRANSPOSITION, VEIN, BASILIC
Anesthesia: Monitor Anesthesia Care | Site: Arm Upper | Laterality: Left

## 2016-06-26 MED ORDER — HYDROXYZINE HCL 25 MG PO TABS: 25.0000 mg | ORAL_TABLET | Freq: Every day | ORAL | Status: DC | PRN

## 2016-06-26 MED ORDER — VANCOMYCIN HCL IN DEXTROSE 1-5 GM/200ML-% IV SOLN
1000.0000 mg | INTRAVENOUS | Status: AC
  Administered 2016-06-26: 1000 mg via INTRAVENOUS
  Filled 2016-06-26: qty 200

## 2016-06-26 MED ORDER — LEVOTHYROXINE SODIUM 25 MCG PO TABS: ORAL_TABLET | ORAL | 2 refills | Status: DC

## 2016-06-26 MED ORDER — ONDANSETRON HCL 4 MG/2ML IJ SOLN
INTRAMUSCULAR | Status: AC
  Filled 2016-06-26: qty 2

## 2016-06-26 MED ORDER — FENTANYL CITRATE (PF) 100 MCG/2ML IJ SOLN
INTRAMUSCULAR | Status: AC
  Filled 2016-06-26: qty 2

## 2016-06-26 MED ORDER — FENTANYL CITRATE (PF) 100 MCG/2ML IJ SOLN
25.0000 ug | INTRAMUSCULAR | Status: DC | PRN
  Administered 2016-06-26 (×2): 50 ug via INTRAVENOUS

## 2016-06-26 MED ORDER — OXYCODONE-ACETAMINOPHEN 5-325 MG PO TABS: 1.0000 | ORAL_TABLET | Freq: Four times a day (QID) | ORAL | 0 refills | Status: DC | PRN

## 2016-06-26 MED ORDER — MIDAZOLAM HCL 2 MG/2ML IJ SOLN
INTRAMUSCULAR | Status: AC
  Filled 2016-06-26: qty 2

## 2016-06-26 MED ORDER — LIDOCAINE 2% (20 MG/ML) 5 ML SYRINGE
INTRAMUSCULAR | Status: DC | PRN
  Administered 2016-06-26: 40 mg via INTRAVENOUS

## 2016-06-26 MED ORDER — MEPERIDINE HCL 25 MG/ML IJ SOLN: 6.2500 mg | INTRAMUSCULAR | Status: DC | PRN

## 2016-06-26 MED ORDER — SODIUM CHLORIDE 0.9 % IV SOLN
INTRAVENOUS | Status: DC | PRN
  Administered 2016-06-26: 10:00:00

## 2016-06-26 MED ORDER — DEXAMETHASONE SODIUM PHOSPHATE 4 MG/ML IJ SOLN: 8.0000 mg | Freq: Once | INTRAMUSCULAR | Status: DC | PRN

## 2016-06-26 MED ORDER — LIDOCAINE 2% (20 MG/ML) 5 ML SYRINGE
INTRAMUSCULAR | Status: AC
  Filled 2016-06-26: qty 5

## 2016-06-26 MED ORDER — CHLORHEXIDINE GLUCONATE CLOTH 2 % EX PADS: 6.0000 | MEDICATED_PAD | Freq: Once | CUTANEOUS | Status: DC

## 2016-06-26 MED ORDER — PROPOFOL 500 MG/50ML IV EMUL
INTRAVENOUS | Status: DC | PRN
  Administered 2016-06-26: 60 ug/kg/min via INTRAVENOUS
  Administered 2016-06-26: 75 ug/kg/min via INTRAVENOUS

## 2016-06-26 MED ORDER — LIDOCAINE-EPINEPHRINE (PF) 1 %-1:200000 IJ SOLN
INTRAMUSCULAR | Status: DC | PRN
  Administered 2016-06-26: 28 mL

## 2016-06-26 MED ORDER — LIDOCAINE-EPINEPHRINE (PF) 1 %-1:200000 IJ SOLN
INTRAMUSCULAR | Status: AC
  Filled 2016-06-26: qty 30

## 2016-06-26 MED ORDER — MIDAZOLAM HCL 5 MG/5ML IJ SOLN
INTRAMUSCULAR | Status: DC | PRN
  Administered 2016-06-26: 1 mg via INTRAVENOUS

## 2016-06-26 MED ORDER — EPHEDRINE SULFATE 50 MG/ML IJ SOLN
INTRAMUSCULAR | Status: DC | PRN
  Administered 2016-06-26: 5 mg via INTRAVENOUS

## 2016-06-26 MED ORDER — INSULIN GLARGINE 300 UNIT/ML ~~LOC~~ SOPN: 25.0000 [IU] | PEN_INJECTOR | Freq: Every day | SUBCUTANEOUS | Status: DC

## 2016-06-26 MED ORDER — TRIAMCINOLONE ACETONIDE 0.5 % EX OINT: 1.0000 "application " | TOPICAL_OINTMENT | Freq: Every day | CUTANEOUS | Status: DC

## 2016-06-26 MED ORDER — SODIUM CHLORIDE 0.9 % IV SOLN
INTRAVENOUS | Status: DC
  Administered 2016-06-26: 09:00:00 via INTRAVENOUS

## 2016-06-26 MED ORDER — RANITIDINE HCL 75 MG PO TABS
75.0000 mg | ORAL_TABLET | Freq: Every day | ORAL | Status: DC | PRN
Start: 2016-06-26 — End: 2017-02-25

## 2016-06-26 MED ORDER — EPHEDRINE 5 MG/ML INJ
INTRAVENOUS | Status: AC
  Filled 2016-06-26: qty 10

## 2016-06-26 MED ORDER — ONDANSETRON HCL 4 MG/2ML IJ SOLN
INTRAMUSCULAR | Status: DC | PRN
  Administered 2016-06-26: 4 mg via INTRAVENOUS

## 2016-06-26 MED ORDER — PROPOFOL 10 MG/ML IV BOLUS
INTRAVENOUS | Status: DC | PRN
  Administered 2016-06-26: 20 mg via INTRAVENOUS

## 2016-06-26 MED ORDER — PROPOFOL 10 MG/ML IV BOLUS
INTRAVENOUS | Status: AC
  Filled 2016-06-26: qty 20

## 2016-06-26 MED ORDER — FENTANYL CITRATE (PF) 100 MCG/2ML IJ SOLN
INTRAMUSCULAR | Status: DC
Start: 2016-06-26 — End: 2016-06-26
  Filled 2016-06-26: qty 2

## 2016-06-26 MED ORDER — 0.9 % SODIUM CHLORIDE (POUR BTL) OPTIME
TOPICAL | Status: DC | PRN
  Administered 2016-06-26: 1000 mL

## 2016-06-26 SURGICAL SUPPLY — 38 items
ADH SKN CLS APL DERMABOND .7 (GAUZE/BANDAGES/DRESSINGS) ×1
ARMBAND PINK RESTRICT EXTREMIT (MISCELLANEOUS) ×3 IMPLANT
CANISTER SUCT 3000ML PPV (MISCELLANEOUS) ×3 IMPLANT
CANNULA VESSEL 3MM 2 BLNT TIP (CANNULA) ×3 IMPLANT
CLIP LIGATING EXTRA MED SLVR (CLIP) ×3 IMPLANT
CLIP LIGATING EXTRA SM BLUE (MISCELLANEOUS) ×3 IMPLANT
COVER PROBE W GEL 5X96 (DRAPES) ×3 IMPLANT
DECANTER SPIKE VIAL GLASS SM (MISCELLANEOUS) ×3 IMPLANT
DERMABOND ADVANCED (GAUZE/BANDAGES/DRESSINGS) ×2
DERMABOND ADVANCED .7 DNX12 (GAUZE/BANDAGES/DRESSINGS) ×1 IMPLANT
ELECT REM PT RETURN 9FT ADLT (ELECTROSURGICAL) ×3
ELECTRODE REM PT RTRN 9FT ADLT (ELECTROSURGICAL) ×1 IMPLANT
GLOVE BIO SURGEON STRL SZ 6.5 (GLOVE) ×4 IMPLANT
GLOVE BIO SURGEONS STRL SZ 6.5 (GLOVE) ×2
GLOVE BIOGEL PI IND STRL 6.5 (GLOVE) ×1 IMPLANT
GLOVE BIOGEL PI IND STRL 7.0 (GLOVE) ×3 IMPLANT
GLOVE BIOGEL PI INDICATOR 6.5 (GLOVE) ×2
GLOVE BIOGEL PI INDICATOR 7.0 (GLOVE) ×6
GLOVE SS BIOGEL STRL SZ 7.5 (GLOVE) ×1 IMPLANT
GLOVE SUPERSENSE BIOGEL SZ 7.5 (GLOVE) ×2
GLOVE SURG SS PI 7.0 STRL IVOR (GLOVE) ×6 IMPLANT
GOWN STRL REUS W/ TWL LRG LVL3 (GOWN DISPOSABLE) ×3 IMPLANT
GOWN STRL REUS W/ TWL XL LVL3 (GOWN DISPOSABLE) ×3 IMPLANT
GOWN STRL REUS W/TWL LRG LVL3 (GOWN DISPOSABLE) ×9
GOWN STRL REUS W/TWL XL LVL3 (GOWN DISPOSABLE) ×9
KIT BASIN OR (CUSTOM PROCEDURE TRAY) ×3 IMPLANT
KIT ROOM TURNOVER OR (KITS) ×3 IMPLANT
NS IRRIG 1000ML POUR BTL (IV SOLUTION) ×3 IMPLANT
PACK CV ACCESS (CUSTOM PROCEDURE TRAY) ×3 IMPLANT
PAD ARMBOARD 7.5X6 YLW CONV (MISCELLANEOUS) ×6 IMPLANT
SUT PROLENE 6 0 CC (SUTURE) ×9 IMPLANT
SUT SILK 2 0 SH (SUTURE) ×3 IMPLANT
SUT SILK 3 0 (SUTURE) ×3
SUT SILK 3-0 18XBRD TIE 12 (SUTURE) ×1 IMPLANT
SUT VIC AB 3-0 SH 27 (SUTURE) ×12
SUT VIC AB 3-0 SH 27X BRD (SUTURE) ×4 IMPLANT
UNDERPAD 30X30 (UNDERPADS AND DIAPERS) ×3 IMPLANT
WATER STERILE IRR 1000ML POUR (IV SOLUTION) ×3 IMPLANT

## 2016-06-26 NOTE — Telephone Encounter (Signed)
Scheduled 4/5 10am 

## 2016-06-26 NOTE — Telephone Encounter (Signed)
-----   Message from Mena Goes, RN sent at 06/26/2016  2:25 PM EDT ----- Regarding: 3-4 weeks   ----- Message ----- From: Alvia Grove, PA-C Sent: 06/26/2016   1:04 PM To: [facility] Charge Pool  S/p 2nd stage left BVT 06/26/16  F/u with Dr. Donnetta Hutching in 3-4 weeks. No duplex.   Thanks Maudie Mercury

## 2016-06-26 NOTE — Anesthesia Postprocedure Evaluation (Signed)
Anesthesia Post Note  Patient: Brooke Stafford  Procedure(s) Performed: Procedure(s) (LRB): SECOND STAGE BASILIC VEIN TRANSPOSITION (Left)  Patient location during evaluation: PACU Anesthesia Type: MAC Level of consciousness: awake Pain management: pain level controlled Vital Signs Assessment: post-procedure vital signs reviewed and stable Respiratory status: spontaneous breathing Cardiovascular status: stable Postop Assessment: no signs of nausea or vomiting Anesthetic complications: no       Last Vitals:  Vitals:   06/26/16 1315 06/26/16 1330  BP: 99/62 115/61  Pulse: 60 (!) 57  Resp: 12 13  Temp: 36.7 C     Last Pain:  Vitals:   06/26/16 1334  TempSrc:   PainSc: 5                  [employer]

## 2016-06-26 NOTE — Interval H&P Note (Signed)
History and Physical Interval Note:  06/26/2016 9:05 AM  Brooke Stafford  has presented today for surgery, with the diagnosis of End stage renal disease N18.6  The various methods of treatment have been discussed with the patient and family. After consideration of risks, benefits and other options for treatment, the patient has consented to  Procedure(s): SECOND STAGE BASILIC VEIN TRANSPOSITION (Left) as a surgical intervention .  The patient's history has been reviewed, patient examined, no change in status, stable for surgery.  I have reviewed the patient's chart and labs.  Questions were answered to the patient's satisfaction.     Curt Jews

## 2016-06-26 NOTE — Op Note (Signed)
    OPERATIVE REPORT  DATE OF SURGERY: 06/26/2016  PATIENT: Brooke Stafford, 61 y.o. female MRN: [id number]  DOB: 1955-08-23  PRE-OPERATIVE DIAGNOSIS: Stage renal disease  POST-OPERATIVE DIAGNOSIS:  Same  PROCEDURE: Second stage basilic vein transposition left arm  SURGEON:  Curt Jews, M.D.  PHYSICIAN ASSISTANT: Silva Bandy PA-C  ANESTHESIA:  Local with sedation  EBL: Minimal ml  Total I/O In: 540 [P.O.:240; I.V.:300] Out: 5 [Blood:5]  BLOOD ADMINISTERED: None  DRAINS: None  SPECIMEN: None  COUNTS CORRECT:  YES  PLAN OF CARE: PACU stable   PATIENT DISPOSITION:  PACU - hemodynamically stable  PROCEDURE DETAILS: The patient was taken to the operative placed supine position where the area of the left arm prepped draped in sterile fashion. SonoSite ultrasound was used to mark the location of the basilic vein from the antecubital space to the axilla. Patient had a first stage fistula approximately 6 weeks earlier. The vein was initially exposed to the old brachial anastomosis incision the vein was isolated just proximal to the old fistula. 3 separate incisions were made in the medial aspect of the upper arm for exposure of the basilic vein. The vein was of good caliber throughout its course. Tributary branches were ligated with 3-0 silk ties and divided. The vein was occluded at the prior brachial artery anastomosis and the vein was transected and brought out through the upper incision at the axilla. The vein had been marked while in place to reduce her risk for twisting. The vein was gently dilated with heparinized saline was of excellent caliber. A tunnel was created from the level of the antecubital space to the axilla and the vein was brought through the tunnel. The vein was spatulated at the old anastomosis and was spatulated just above this level. The vein was sewn into into itself with a running 6-0 Prolene suture and clamps removed and excellent thrill was noted. The wound  irrigated with saline. Hemostasis tablet cautery. The wounds were closed with 3-0 Vicryl in the subcutaneous and subcuticular tissue. Sterile dressing was applied   Rosetta Posner, M.D., [facility] 06/26/2016 2:28 PM

## 2016-06-26 NOTE — Anesthesia Procedure Notes (Signed)
Procedure Name: MAC Date/Time: 06/26/2016 10:45 AM Performed by: Candis Shine Pre-anesthesia Checklist: Patient identified, Emergency Drugs available, Suction available and Patient being monitored Patient Re-evaluated:Patient Re-evaluated prior to inductionOxygen Delivery Method: Simple face mask Dental Injury: Teeth and Oropharynx as per pre-operative assessment

## 2016-06-26 NOTE — H&P (View-Only) (Signed)
POST OPERATIVE OFFICE NOTE    CC:  F/u for surgery  HPI:  This is a 61 y.o. female who is s/p 1st stage left BVT on 05/10/16.  She states she is doing well.  She has intermittent numbness of her left hand that is tolerable.  She dialyzes M/W/F.   She is on Plavix.   Allergies  Allergen Reactions  . Digoxin And Related Other (See Comments)    Patient stated she almost died. Had flu like symptoms as well as diarrhea.  . Hydralazine Shortness Of Breath  . Penicillins Cross Reactors Hives and Other (See Comments)    HIGH FEVER  Has patient had a PCN reaction causing immediate rash, facial/tongue/throat swelling, SOB or lightheadedness with hypotension: No Has patient had a PCN reaction causing SEVERE RASH INVOLVING MUCUS MEMBRANES or SKIN NECROSIS  #  #  #  YES  #  #  #  Has patient had a PCN reaction that required hospitalization: Unk Has patient had a PCN reaction occurring within the last 10 years: Unk If all of the above answers are "NO", then may proceed with Cephalosporin use.   [name] Lisinopril Other (See Comments)    Felt like she had the flu. Was very sick!!!  . Adhesive [Tape] Rash    bruising    Current Outpatient Prescriptions  Medication Sig Dispense Refill  . albuterol (PROVENTIL HFA;VENTOLIN HFA) 108 (90 Base) MCG/ACT inhaler Inhale 2 puffs into the lungs every 6 (six) hours as needed for wheezing or shortness of breath. 3 Inhaler 3  . albuterol (PROVENTIL) (2.5 MG/3ML) 0.083% nebulizer solution Take 3 mLs (2.5 mg total) by nebulization every 6 (six) hours as needed for wheezing or shortness of breath. 150 mL 3  . allopurinol (ZYLOPRIM) 300 MG tablet Take 1 tablet (300 mg total) by mouth daily. 90 tablet 3  . amLODipine (NORVASC) 10 MG tablet take 1 tablet by mouth once daily 30 tablet 11  . aspirin 81 MG chewable tablet Chew 1 tablet (81 mg total) by mouth daily. 30 tablet 10  . atorvastatin (LIPITOR) 40 MG tablet Take 1 tablet (40 mg total) by mouth daily. 30 tablet 3  .  benzonatate (TESSALON) 100 MG capsule Take 100 mg by mouth 3 (three) times daily as needed for cough.  0  . calcitRIOL (ROCALTROL) 0.25 MCG capsule Take 0.25 mcg by mouth daily.  0  . cloNIDine (CATAPRES) 0.2 MG tablet Take 1 tablet (0.2 mg total) by mouth 3 (three) times daily. 90 tablet 3  . clopidogrel (PLAVIX) 75 MG tablet Take 1 tablet (75 mg total) by mouth daily. 90 tablet 3  . Darbepoetin Alfa (ARANESP) 200 MCG/0.4ML SOSY injection Inject 0.4 mLs (200 mcg total) into the vein every Tuesday with hemodialysis. 1.68 mL   . ezetimibe (ZETIA) 10 MG tablet Take 1 tablet (10 mg total) by mouth daily. 90 tablet 3  . ferric gluconate 125 mg in sodium chloride 0.9 % 100 mL Inject 125 mg into the vein Every Tuesday,Thursday,and Saturday with dialysis.    . fluticasone (FLONASE) 50 MCG/ACT nasal spray Place 2 sprays into both nostrils daily as needed for allergies or rhinitis. 16 g 3  . Fluticasone-Salmeterol (ADVAIR DISKUS) 250-50 MCG/DOSE AEPB Inhale 1 puff into the lungs 2 (two) times daily. 60 each 3  . hydrOXYzine (ATARAX/VISTARIL) 25 MG tablet Take 1 tablet (25 mg total) by mouth 3 (three) times daily. 90 tablet 3  . insulin aspart (NOVOLOG) 100 UNIT/ML injection Inject into the skin  3 (three) times daily before meals. Inject as per sliding scale: 0-150=0 units, 151+=5 units    . Insulin Glargine (TOUJEO SOLOSTAR) 300 UNIT/ML SOPN Inject 30 Units into the skin at bedtime.    [name] levothyroxine (SYNTHROID, LEVOTHROID) 25 MCG tablet take 1 tablet by mouth every morning BEFORE BREAKFAST 30 tablet 2  . metoprolol tartrate (LOPRESSOR) 25 MG tablet Take 1 tablet (25 mg total) by mouth 2 (two) times daily. 60 tablet 0  . Naphazoline HCl (CLEAR EYES OP) Place 1 drop into both eyes as needed (for dry eyes).    . nitroGLYCERIN (NITROSTAT) 0.4 MG SL tablet Place 1 tablet (0.4 mg total) under the tongue every 5 (five) minutes as needed. 30 tablet 2  . prednisoLONE acetate (PRED FORTE) 1 % ophthalmic suspension  Place 1 drop into both eyes 3 (three) times daily.  1  . ranitidine (ZANTAC 75) 75 MG tablet Take 1 tablet (75 mg total) by mouth 2 (two) times daily. 60 tablet 3  . senna-docusate (SENOKOT-S) 8.6-50 MG tablet Take 1 tablet by mouth at bedtime as needed for mild constipation. 30 tablet 0  . sevelamer carbonate (RENVELA) 2.4 g PACK Take 2.4 g by mouth 3 (three) times daily with meals. 90 each 0  . triamcinolone ointment (KENALOG) 0.5 % Apply 1 application topically 2 (two) times daily. 30 g 2   No current facility-administered medications for this visit.      ROS:  See HPI  Physical Exam:  Vitals:   06/04/16 1423  BP: 137/74  Pulse: 73  Resp: 18  Temp: 98.7 F (37.1 C)    Incision:  Healing nicely Extremities:  Excellent thrill within the fistula; +bruit; + doppler signal left radial/ulnar.  Motor and sensation are in tact left hand.    Dialysis Fistula Duplex Evaluation 05/31/16: Diameter of left BVT measures 0.57cm-0.73cm Depth measures 0.7cm-2.30cm   Assessment/Plan:  This is a 61 y.o. female who is s/p: 1st stage left basilic vein transposition  -pt has intermittent numbness in her left hand that is tolerable.  Her motor and sensation are in tact. -the fistula is maturing nicely.  We will set her up for 2nd stage BVT in the next couple of weeks.  She dialyzes M/W/F and since Dr. Donnetta Hutching does not operate on T/T, her dialysis days will have to be coordinated by our scheduler and the dialysis center [facility]). -she is on Plavix and this will not be held per Dr. Annia Belt, PA-C [facility] [phone]  Clinic MD:  Pt seen and examined with Dr. Donnetta Hutching  I have examined the patient, reviewed and agree with above.  Curt Jews, MD 06/04/2016 4:03 PM

## 2016-06-26 NOTE — Transfer of Care (Signed)
Immediate Anesthesia Transfer of Care Note  Patient: DRIANA DAZEY  Procedure(s) Performed: Procedure(s): SECOND STAGE BASILIC VEIN TRANSPOSITION (Left)  Patient Location: PACU  Anesthesia Type:MAC  Level of Consciousness: awake, alert  and oriented  Airway & Oxygen Therapy: Patient Spontanous Breathing and Patient connected to face mask oxygen  Post-op Assessment: Report given to RN and Post -op Vital signs reviewed and stable  Post vital signs: Reviewed and stable  Last Vitals:  Vitals:   06/26/16 0813  BP: 132/77  Pulse: (!) 56  Resp: 18  Temp: 36.4 C    Last Pain:  Vitals:   06/26/16 0834  TempSrc:   PainSc: 0-No pain      Patients Stated Pain Goal: 3 (53/29/92 4268)  Complications: No apparent anesthesia complications

## 2016-06-26 NOTE — Patient Outreach (Signed)
[facility]) Care Management   06/25/2016  Brooke Stafford 1955/08/21 [id number]  Brooke Stafford is an 61 y.o. female  Subjective:  I have been feeling dizzy this morning. Patient reports falling yesterday  Objective:   ROS Patient experiencing orthostatic hypotension with complaints of dizziness.   Physical Exam  ROS  Encounter Medications:   No facility-administered encounter medications on file as of 06/25/2016.    Outpatient Encounter Prescriptions as of 06/25/2016  Medication Sig Note  . acetaminophen-codeine (TYLENOL #3) 300-30 MG tablet Take 1 tablet by mouth 2 (two) times daily as needed for moderate pain.   [name] albuterol (PROVENTIL HFA;VENTOLIN HFA) 108 (90 Base) MCG/ACT inhaler Inhale 2 puffs into the lungs every 6 (six) hours as needed for wheezing or shortness of breath.   [name] albuterol (PROVENTIL) (2.5 MG/3ML) 0.083% nebulizer solution Take 3 mLs (2.5 mg total) by nebulization every 6 (six) hours as needed for wheezing or shortness of breath.   . allopurinol (ZYLOPRIM) 300 MG tablet Take 1 tablet (300 mg total) by mouth daily.   [name] amLODipine (NORVASC) 10 MG tablet take 1 tablet by mouth once daily (Patient taking differently: take 10mg s once daily) 06/26/2016: Pt reports was taken off of 2 weeks ago while at dialysis  . aspirin 81 MG chewable tablet Chew 1 tablet (81 mg total) by mouth daily.   [name] atorvastatin (LIPITOR) 40 MG tablet Take 1 tablet (40 mg total) by mouth daily. (Patient taking differently: Take 40 mg by mouth every evening. )   . benzonatate (TESSALON) 100 MG capsule Take 100 mg by mouth 3 (three) times daily as needed for cough.   . cloNIDine (CATAPRES) 0.2 MG tablet Take 1 tablet (0.2 mg total) by mouth 3 (three) times daily.   . clopidogrel (PLAVIX) 75 MG tablet Take 1 tablet (75 mg total) by mouth daily.   . Darbepoetin Alfa (ARANESP) 200 MCG/0.4ML SOSY injection Inject 0.4 mLs (200 mcg total) into the vein every Tuesday with  hemodialysis.   [name] ezetimibe (ZETIA) 10 MG tablet Take 1 tablet (10 mg total) by mouth daily.   . ferric gluconate 125 mg in sodium chloride 0.9 % 100 mL Inject 125 mg into the vein Every Tuesday,Thursday,and Saturday with dialysis.   . hydrOXYzine (ATARAX/VISTARIL) 25 MG tablet Take 1 tablet (25 mg total) by mouth 3 (three) times daily. (Patient taking differently: Take 25 mg by mouth daily as needed for anxiety or itching. )   . insulin aspart (NOVOLOG) 100 UNIT/ML injection Inject 15-20 Units into the skin 3 (three) times daily before meals. Inject as per sliding scale: 0-150=0 units, 151+=5 units    . Insulin Glargine (TOUJEO SOLOSTAR) 300 UNIT/ML SOPN Inject 30 Units into the skin at bedtime. (Patient taking differently: Inject 25 Units into the skin at bedtime. )   . isosorbide mononitrate (IMDUR) 60 MG 24 hr tablet Take 60 mg by mouth daily.   [name] levothyroxine (SYNTHROID, LEVOTHROID) 25 MCG tablet take 1 tablet by mouth every morning BEFORE BREAKFAST (Patient taking differently: take 45mcg by mouth every morning BEFORE BREAKFAST)   . Naphazoline HCl (CLEAR EYES OP) Place 1 drop into both eyes as needed (for dry eyes).   . naproxen (NAPROSYN) 500 MG tablet Take 500 mg by mouth daily as needed for moderate pain.   . nitroGLYCERIN (NITROSTAT) 0.4 MG SL tablet Place 1 tablet (0.4 mg total) under the tongue every 5 (five) minutes as needed. (Patient taking differently: Place 0.4 mg under the tongue  every 5 (five) minutes as needed for chest pain. )   . prednisoLONE acetate (PRED FORTE) 1 % ophthalmic suspension Place 1 drop into both eyes 2 (two) times daily.    . ranitidine (ZANTAC 75) 75 MG tablet Take 1 tablet (75 mg total) by mouth 2 (two) times daily. (Patient taking differently: Take 75 mg by mouth daily as needed for heartburn. )   . senna-docusate (SENOKOT-S) 8.6-50 MG tablet Take 1 tablet by mouth at bedtime as needed for mild constipation.   . sevelamer (RENAGEL) 800 MG tablet Take 2,400 mg  by mouth 3 (three) times daily with meals.   . triamcinolone ointment (KENALOG) 0.5 % Apply 1 application topically 2 (two) times daily. (Patient taking differently: Apply 1 application topically daily. )   . calcitRIOL (ROCALTROL) 0.25 MCG capsule Take 0.25 mcg by mouth daily.   . fluticasone (FLONASE) 50 MCG/ACT nasal spray Place 2 sprays into both nostrils daily as needed for allergies or rhinitis. (Patient not taking: Reported on 06/25/2016)   . Fluticasone-Salmeterol (ADVAIR DISKUS) 250-50 MCG/DOSE AEPB Inhale 1 puff into the lungs 2 (two) times daily. (Patient not taking: Reported on 06/17/2016)   . gabapentin (NEURONTIN) 400 MG capsule Take 400 mg by mouth 2 (two) times daily.   [name] loratadine (CLARITIN) 10 MG tablet Take 10 mg by mouth daily.   . metoprolol tartrate (LOPRESSOR) 25 MG tablet Take 1 tablet (25 mg total) by mouth 2 (two) times daily.   . sevelamer carbonate (RENVELA) 2.4 g PACK Take 2.4 g by mouth 3 (three) times daily with meals. (Patient not taking: Reported on 06/17/2016)     Functional Status:   In your present state of health, do you have any difficulty performing the following activities: 06/26/2016 06/18/2016  Hearing? - N  Vision? - Y  Difficulty concentrating or making decisions? - N  Walking or climbing stairs? Y Y  Dressing or bathing? - N  Doing errands, shopping? - Y  Conservation officer, nature and eating ? - N  Using the Toilet? - N  In the past six months, have you accidently leaked urine? - N  Do you have problems with loss of bowel control? - N  Managing your Medications? - N  Managing your Finances? - N  Housekeeping or managing your Housekeeping? - Y  Some recent data might be hidden    Fall/Depression Screening:    PHQ 2/9 Scores 06/18/2016 05/15/2016 04/26/2016 04/03/2016 03/11/2016 02/23/2016 01/11/2016  PHQ - 2 Score 0 2 2 0 2 6 1   PHQ- 9 Score - 8 9 - 6 27 -     Assessment:    Patient reports feeling dizzy when she stands up. Patient not using her walker,  noted getting up from chair real fast Patient and RNCM made telephone call to her primary care physician to report dizziness, orthostatic. Patient reports being compliant with dialysis treatments which started in January 2018.  Plan:  Patient has appointment with primary care today, RNCM advised her female friendWilliam Tye Savoy of the appointments.

## 2016-06-27 ENCOUNTER — Encounter (HOSPITAL_COMMUNITY): Payer: Self-pay | Admitting: Vascular Surgery

## 2016-06-28 DIAGNOSIS — N2581 Secondary hyperparathyroidism of renal origin: Secondary | ICD-10-CM | POA: Diagnosis not present

## 2016-06-28 DIAGNOSIS — N186 End stage renal disease: Secondary | ICD-10-CM | POA: Diagnosis not present

## 2016-06-28 DIAGNOSIS — R509 Fever, unspecified: Secondary | ICD-10-CM | POA: Diagnosis not present

## 2016-06-28 DIAGNOSIS — D509 Iron deficiency anemia, unspecified: Secondary | ICD-10-CM | POA: Diagnosis not present

## 2016-06-28 DIAGNOSIS — E1129 Type 2 diabetes mellitus with other diabetic kidney complication: Secondary | ICD-10-CM | POA: Diagnosis not present

## 2016-07-01 DIAGNOSIS — E1129 Type 2 diabetes mellitus with other diabetic kidney complication: Secondary | ICD-10-CM | POA: Diagnosis not present

## 2016-07-01 DIAGNOSIS — R509 Fever, unspecified: Secondary | ICD-10-CM | POA: Diagnosis not present

## 2016-07-01 DIAGNOSIS — N186 End stage renal disease: Secondary | ICD-10-CM | POA: Diagnosis not present

## 2016-07-01 DIAGNOSIS — D509 Iron deficiency anemia, unspecified: Secondary | ICD-10-CM | POA: Diagnosis not present

## 2016-07-01 DIAGNOSIS — N2581 Secondary hyperparathyroidism of renal origin: Secondary | ICD-10-CM | POA: Diagnosis not present

## 2016-07-03 DIAGNOSIS — D509 Iron deficiency anemia, unspecified: Secondary | ICD-10-CM | POA: Diagnosis not present

## 2016-07-03 DIAGNOSIS — N2581 Secondary hyperparathyroidism of renal origin: Secondary | ICD-10-CM | POA: Diagnosis not present

## 2016-07-03 DIAGNOSIS — R509 Fever, unspecified: Secondary | ICD-10-CM | POA: Diagnosis not present

## 2016-07-03 DIAGNOSIS — E1129 Type 2 diabetes mellitus with other diabetic kidney complication: Secondary | ICD-10-CM | POA: Diagnosis not present

## 2016-07-03 DIAGNOSIS — N186 End stage renal disease: Secondary | ICD-10-CM | POA: Diagnosis not present

## 2016-07-05 ENCOUNTER — Other Ambulatory Visit: Payer: Self-pay | Admitting: Licensed Clinical Social Worker

## 2016-07-05 DIAGNOSIS — R509 Fever, unspecified: Secondary | ICD-10-CM | POA: Diagnosis not present

## 2016-07-05 DIAGNOSIS — N2581 Secondary hyperparathyroidism of renal origin: Secondary | ICD-10-CM | POA: Diagnosis not present

## 2016-07-05 DIAGNOSIS — E1129 Type 2 diabetes mellitus with other diabetic kidney complication: Secondary | ICD-10-CM | POA: Diagnosis not present

## 2016-07-05 DIAGNOSIS — D509 Iron deficiency anemia, unspecified: Secondary | ICD-10-CM | POA: Diagnosis not present

## 2016-07-05 DIAGNOSIS — N186 End stage renal disease: Secondary | ICD-10-CM | POA: Diagnosis not present

## 2016-07-05 NOTE — Patient Outreach (Signed)
[facility]) Care Management  07/05/2016  Brooke Stafford Jul 29, 1955 [id number]   Assessment- CSW completed outreach call to patient and she answered. Patient had second stage basilic vein transposition on 06/26/16. She reports that the surgery was successful but that her arm got infected from having the surgery. She states that she is currently taking antibiotics now. She states "I'm not having any pain. The medicine is really helping with that." She shares that her follow up surgery appointment is on 07/18/16. CSW questioned if she had received anything in the mail from [employer] and she declined. Patient reports that she is agreeable to contacting [employer] in regards to this and her application status.  Plan-CSW will continue to provide social work assistance and will follow up within three weeks.  Brooke Stafford, BSW, MSW, [email or web address] Phone: [phone] Fax: [phone]

## 2016-07-08 DIAGNOSIS — N2581 Secondary hyperparathyroidism of renal origin: Secondary | ICD-10-CM | POA: Diagnosis not present

## 2016-07-08 DIAGNOSIS — E1129 Type 2 diabetes mellitus with other diabetic kidney complication: Secondary | ICD-10-CM | POA: Diagnosis not present

## 2016-07-08 DIAGNOSIS — N186 End stage renal disease: Secondary | ICD-10-CM | POA: Diagnosis not present

## 2016-07-08 DIAGNOSIS — D509 Iron deficiency anemia, unspecified: Secondary | ICD-10-CM | POA: Diagnosis not present

## 2016-07-08 DIAGNOSIS — R509 Fever, unspecified: Secondary | ICD-10-CM | POA: Diagnosis not present

## 2016-07-09 ENCOUNTER — Telehealth: Payer: Self-pay | Admitting: Internal Medicine

## 2016-07-09 ENCOUNTER — Encounter: Payer: Self-pay | Admitting: Vascular Surgery

## 2016-07-09 ENCOUNTER — Ambulatory Visit: Payer: Medicare Other | Attending: Internal Medicine | Admitting: Physician Assistant

## 2016-07-09 VITALS — BP 121/71 | HR 88 | Wt 212.8 lb

## 2016-07-09 DIAGNOSIS — E1122 Type 2 diabetes mellitus with diabetic chronic kidney disease: Secondary | ICD-10-CM | POA: Diagnosis not present

## 2016-07-09 DIAGNOSIS — N186 End stage renal disease: Secondary | ICD-10-CM

## 2016-07-09 DIAGNOSIS — Z7982 Long term (current) use of aspirin: Secondary | ICD-10-CM | POA: Insufficient documentation

## 2016-07-09 DIAGNOSIS — Z794 Long term (current) use of insulin: Secondary | ICD-10-CM | POA: Diagnosis not present

## 2016-07-09 DIAGNOSIS — Z992 Dependence on renal dialysis: Secondary | ICD-10-CM | POA: Diagnosis not present

## 2016-07-09 DIAGNOSIS — Z9861 Coronary angioplasty status: Secondary | ICD-10-CM

## 2016-07-09 DIAGNOSIS — F339 Major depressive disorder, recurrent, unspecified: Secondary | ICD-10-CM

## 2016-07-09 DIAGNOSIS — F329 Major depressive disorder, single episode, unspecified: Secondary | ICD-10-CM | POA: Diagnosis not present

## 2016-07-09 DIAGNOSIS — R42 Dizziness and giddiness: Secondary | ICD-10-CM

## 2016-07-09 DIAGNOSIS — I2510 Atherosclerotic heart disease of native coronary artery without angina pectoris: Secondary | ICD-10-CM

## 2016-07-09 DIAGNOSIS — I132 Hypertensive heart and chronic kidney disease with heart failure and with stage 5 chronic kidney disease, or end stage renal disease: Secondary | ICD-10-CM | POA: Diagnosis not present

## 2016-07-09 DIAGNOSIS — I5042 Chronic combined systolic (congestive) and diastolic (congestive) heart failure: Secondary | ICD-10-CM | POA: Insufficient documentation

## 2016-07-09 MED ORDER — CITALOPRAM HYDROBROMIDE 20 MG PO TABS: 20.0000 mg | ORAL_TABLET | Freq: Every day | ORAL | 3 refills | Status: DC

## 2016-07-09 NOTE — Telephone Encounter (Signed)
Patient called to state that pharmacy will not refill medication due to insurance. Could the nurse please call to advise the pharmacy and patient on what her next steps should be? Possibly another medication in place of it?  [facility], [place] [phone] (Phone) [phone] (Fax)

## 2016-07-09 NOTE — Telephone Encounter (Signed)
Please advise. The Toujeo was submitted by Raquel James, at her appointment yesterday; patient has not been seen by you since 03/2016. I called the pharmacy and they need a PA for this. How to proceed since you did not prescribe this medication.

## 2016-07-09 NOTE — Telephone Encounter (Signed)
If they do not cover toujeo, we need to find out which of the long-acting insulins they cover. We can start by sending Lantus at the same dose and see what happens.

## 2016-07-09 NOTE — Telephone Encounter (Signed)
Pt called in requesting refill of her Toujeo be sent to the [facility] on [address].

## 2016-07-09 NOTE — Progress Notes (Signed)
Chief Complaint: Dizzy and depressed  Subjective: This is a 61 year old female with a history of diabetes mellitus type 2, end-stage renal disease recently starting hemodialysis, hypertension, chronic combined congestive heart failure who had a long hospitalization earlier this year. At that time she was started on antidepressant medications. She's wondering if she can have these refilled. She states that with the medication she sleeps better. She eats better. That she has more pleasant thoughts.  She also complains that she's been having some dizziness intermittently. Is usually 1 hour after taking her medications. She started hemodialysis back in January and is under the direction of Dr. Donnetta Hutching having her fistulas created. She is due to have her third procedure on 07/18/2016. After the second procedure on 06/26/2016 she developed an infection on the left arm and has been on doxycycline. She believes that the dizziness has gotten worse on the doxycycline. With hemodialysis she also has a drop in her pressure and she's had multiple medications adjusted including decrease in beta blocker and clonidine and discontinuing Norvasc.   ROS:  GEN: denies fever or chills, denies change in weight Skin: denies lesions or rashes HEENT: denies headache, earache, epistaxis, sore throat, or neck pain +dizzy LUNGS: denies SHOB, dyspnea, PND, orthopnea CV: denies CP or palpitations ABD: denies abd pain, N or V EXT: denies muscle spasms or swelling; no pain in lower ext, no weakness NEURO: denies numbness or tingling, denies sz, stroke or TIA   Objective:  Vitals:   07/09/16 1353  BP: 121/71  Pulse: 88    Physical Exam:  General: in no acute distress. HEENT: no pallor, no icterus, moist oral mucosa, no JVD, no lymphadenopathy Heart: Normal  s1 &s2  Regular rate and rhythm, without murmurs, rubs, gallops. Lungs: Clear to auscultation bilaterally. Abdomen: Soft, nontender, nondistended, positive bowel  sounds. Extremities: No clubbing cyanosis or edema with positive pedal pulses. Neuro: Alert, awake, oriented x3, nonfocal.   Medications: Prior to Admission medications   Medication Sig Start Date End Date Taking? Authorizing Provider  acetaminophen-codeine (TYLENOL #3) 300-30 MG tablet Take 1 tablet by mouth 2 (two) times daily as needed for moderate pain.   Yes Historical Provider, MD  albuterol (PROVENTIL HFA;VENTOLIN HFA) 108 (90 Base) MCG/ACT inhaler Inhale 2 puffs into the lungs every 6 (six) hours as needed for wheezing or shortness of breath. 09/28/15  Yes Tresa Garter, MD  albuterol (PROVENTIL) (2.5 MG/3ML) 0.083% nebulizer solution Take 3 mLs (2.5 mg total) by nebulization every 6 (six) hours as needed for wheezing or shortness of breath. 04/03/16  Yes Tresa Garter, MD  allopurinol (ZYLOPRIM) 300 MG tablet Take 1 tablet (300 mg total) by mouth daily. 02/22/16  Yes Tresa Garter, MD  aspirin 81 MG chewable tablet Chew 1 tablet (81 mg total) by mouth daily. 02/24/15  Yes Mihai Croitoru, MD  atorvastatin (LIPITOR) 40 MG tablet Take 1 tablet (40 mg total) by mouth daily. Patient taking differently: Take 40 mg by mouth every evening.  04/26/16  Yes Arnoldo Morale, MD  calcitRIOL (ROCALTROL) 0.25 MCG capsule Take 0.25 mcg by mouth daily. 04/23/16  Yes Historical Provider, MD  citalopram (CELEXA) 20 MG tablet Take 1 tablet (20 mg total) by mouth daily. 07/09/16  Yes Lonnell Chaput Daneil Dan, PA-C  cloNIDine (CATAPRES) 0.2 MG tablet Take 1 tablet (0.2 mg total) by mouth 3 (three) times daily. 02/22/16  Yes Tresa Garter, MD  clopidogrel (PLAVIX) 75 MG tablet Take 1 tablet (75 mg total) by mouth daily. 09/25/15  Yes Sanda Klein, MD  Darbepoetin Alfa (ARANESP) 200 MCG/0.4ML SOSY injection Inject 0.4 mLs (200 mcg total) into the vein every Tuesday with hemodialysis. 05/21/16  Yes Dron Tanna Furry, MD  ezetimibe (ZETIA) 10 MG tablet Take 1 tablet (10 mg total) by mouth daily. 01/11/16   Yes Tresa Garter, MD  ferric gluconate 125 mg in sodium chloride 0.9 % 100 mL Inject 125 mg into the vein Every Tuesday,Thursday,and Saturday with dialysis. 05/16/16  Yes Dron Tanna Furry, MD  fluticasone [facility]) 50 MCG/ACT nasal spray Place 2 sprays into both nostrils daily as needed for allergies or rhinitis. 09/28/15  Yes Tresa Garter, MD  gabapentin (NEURONTIN) 400 MG capsule Take 400 mg by mouth 2 (two) times daily.   Yes Historical Provider, MD  hydrOXYzine (ATARAX/VISTARIL) 25 MG tablet Take 1 tablet (25 mg total) by mouth daily as needed for anxiety or itching. 06/26/16  Yes Kimberly A Trinh, PA-C  insulin aspart (NOVOLOG) 100 UNIT/ML injection Inject 15-20 Units into the skin 3 (three) times daily before meals. Inject as per sliding scale: 0-150=0 units, 151+=5 units    Yes Historical Provider, MD  Insulin Glargine (TOUJEO SOLOSTAR) 300 UNIT/ML SOPN Inject 25 Units into the skin at bedtime. 06/26/16  Yes Alvia Grove, PA-C  isosorbide mononitrate (IMDUR) 60 MG 24 hr tablet Take 60 mg by mouth daily.   Yes Historical Provider, MD  levothyroxine (SYNTHROID, LEVOTHROID) 25 MCG tablet take 61mcg by mouth every morning BEFORE BREAKFAST 06/26/16  Yes Alvia Grove, PA-C  loratadine (CLARITIN) 10 MG tablet Take 10 mg by mouth daily.   Yes Historical Provider, MD  metoprolol tartrate (LOPRESSOR) 25 MG tablet Take 1 tablet (25 mg total) by mouth 2 (two) times daily. 05/14/16  Yes Dron Tanna Furry, MD  Naphazoline HCl (CLEAR EYES OP) Place 1 drop into both eyes as needed (for dry eyes).   Yes Historical Provider, MD  naproxen (NAPROSYN) 500 MG tablet Take 500 mg by mouth daily as needed for moderate pain.   Yes Historical Provider, MD  nitroGLYCERIN (NITROSTAT) 0.4 MG SL tablet Place 1 tablet (0.4 mg total) under the tongue every 5 (five) minutes as needed. Patient taking differently: Place 0.4 mg under the tongue every 5 (five) minutes as needed for chest pain.  04/11/16  Yes  Arnoldo Morale, MD  oxyCODONE-acetaminophen (PERCOCET/ROXICET) 5-325 MG tablet Take 1 tablet by mouth every 6 (six) hours as needed. 06/26/16  Yes Alvia Grove, PA-C  prednisoLONE acetate (PRED FORTE) 1 % ophthalmic suspension Place 1 drop into both eyes 2 (two) times daily.  03/25/16  Yes Historical Provider, MD  ranitidine (ZANTAC 75) 75 MG tablet Take 1 tablet (75 mg total) by mouth daily as needed for heartburn. 06/26/16  Yes Kimberly A Trinh, PA-C  senna-docusate (SENOKOT-S) 8.6-50 MG tablet Take 1 tablet by mouth at bedtime as needed for mild constipation. 12/23/15  Yes Virgel Manifold, MD  sevelamer (RENAGEL) 800 MG tablet Take 2,400 mg by mouth 3 (three) times daily with meals.   Yes Historical Provider, MD  triamcinolone ointment (KENALOG) 0.5 % Apply 1 application topically daily. 06/26/16  Yes Kimberly A Trinh, PA-C  benzonatate (TESSALON) 100 MG capsule Take 100 mg by mouth 3 (three) times daily as needed for cough. 03/21/16   Historical Provider, MD    Assessment: 1. Depression 2. Dizziness, likely med related 3. ESRD on HD M,W,F  Plan: Refilled Citalopram D/w Dr. Donnetta Hutching an alterative to the Doxy, call today Cont with BP  med adjustments as described above  Follow up:2 weeks with Dr. Jarold Song  The patient was given clear instructions to go to ER or return to medical center if symptoms don't improve, worsen or new problems develop. The patient verbalized understanding. The patient was told to call to get lab results if they haven't heard anything in the next week.   This note has been created with Surveyor, quantity. Any transcriptional errors are unintentional.   Zettie Pho, PA-C 07/09/2016, 1:57 PM

## 2016-07-10 ENCOUNTER — Other Ambulatory Visit: Payer: Self-pay

## 2016-07-10 DIAGNOSIS — N2581 Secondary hyperparathyroidism of renal origin: Secondary | ICD-10-CM | POA: Diagnosis not present

## 2016-07-10 DIAGNOSIS — D509 Iron deficiency anemia, unspecified: Secondary | ICD-10-CM | POA: Diagnosis not present

## 2016-07-10 DIAGNOSIS — R509 Fever, unspecified: Secondary | ICD-10-CM | POA: Diagnosis not present

## 2016-07-10 DIAGNOSIS — E1129 Type 2 diabetes mellitus with other diabetic kidney complication: Secondary | ICD-10-CM | POA: Diagnosis not present

## 2016-07-10 DIAGNOSIS — N186 End stage renal disease: Secondary | ICD-10-CM | POA: Diagnosis not present

## 2016-07-10 MED ORDER — INSULIN GLARGINE 100 UNIT/ML SOLOSTAR PEN: PEN_INJECTOR | SUBCUTANEOUS | 3 refills | Status: DC

## 2016-07-10 NOTE — Telephone Encounter (Signed)
Submitted Lantus.

## 2016-07-12 DIAGNOSIS — N186 End stage renal disease: Secondary | ICD-10-CM | POA: Diagnosis not present

## 2016-07-12 DIAGNOSIS — E1129 Type 2 diabetes mellitus with other diabetic kidney complication: Secondary | ICD-10-CM | POA: Diagnosis not present

## 2016-07-12 DIAGNOSIS — N2581 Secondary hyperparathyroidism of renal origin: Secondary | ICD-10-CM | POA: Diagnosis not present

## 2016-07-12 DIAGNOSIS — D509 Iron deficiency anemia, unspecified: Secondary | ICD-10-CM | POA: Diagnosis not present

## 2016-07-12 DIAGNOSIS — R509 Fever, unspecified: Secondary | ICD-10-CM | POA: Diagnosis not present

## 2016-07-13 DIAGNOSIS — I129 Hypertensive chronic kidney disease with stage 1 through stage 4 chronic kidney disease, or unspecified chronic kidney disease: Secondary | ICD-10-CM | POA: Diagnosis not present

## 2016-07-13 DIAGNOSIS — Z992 Dependence on renal dialysis: Secondary | ICD-10-CM | POA: Diagnosis not present

## 2016-07-13 DIAGNOSIS — N186 End stage renal disease: Secondary | ICD-10-CM | POA: Diagnosis not present

## 2016-07-15 ENCOUNTER — Other Ambulatory Visit: Payer: Self-pay | Admitting: Licensed Clinical Social Worker

## 2016-07-15 DIAGNOSIS — N2581 Secondary hyperparathyroidism of renal origin: Secondary | ICD-10-CM | POA: Diagnosis not present

## 2016-07-15 DIAGNOSIS — N186 End stage renal disease: Secondary | ICD-10-CM | POA: Diagnosis not present

## 2016-07-15 DIAGNOSIS — Z23 Encounter for immunization: Secondary | ICD-10-CM | POA: Diagnosis not present

## 2016-07-15 DIAGNOSIS — D509 Iron deficiency anemia, unspecified: Secondary | ICD-10-CM | POA: Diagnosis not present

## 2016-07-15 DIAGNOSIS — E1129 Type 2 diabetes mellitus with other diabetic kidney complication: Secondary | ICD-10-CM | POA: Diagnosis not present

## 2016-07-15 NOTE — Patient Outreach (Signed)
[facility]) Care Management  07/15/2016  GRACILYN GUNIA [birth year] [id number]   Assessment-CSW completed outreach attempt today. CSW unable to reach patient successfully as phone call went straight to voice mail box. CSW left a HIPPA compliant voice message encouraging patient to return call once available.  Plan-CSW will await return call or complete an additional outreach if needed.  [name], BSW, MSW, [email or web address] Phone: [phone] Fax: [phone]

## 2016-07-15 NOTE — Patient Outreach (Signed)
[facility]) Care Management  07/15/2016  Brooke Stafford 1955/10/01 [id number]  Assessment- CSW received return call from patient who is agreeable to home visit tomorrow on 07/16/16.  Plan-CSW will complete home visit tomorrow and provide social work resource review.  [name], BSW, MSW, [name] Phone: [phone] Fax: [phone]

## 2016-07-16 ENCOUNTER — Other Ambulatory Visit: Payer: Self-pay | Admitting: Licensed Clinical Social Worker

## 2016-07-16 ENCOUNTER — Ambulatory Visit: Payer: Self-pay | Admitting: Licensed Clinical Social Worker

## 2016-07-16 ENCOUNTER — Other Ambulatory Visit: Payer: Self-pay

## 2016-07-16 NOTE — Patient Outreach (Signed)
[facility]) Care Management  07/16/2016  ANESSA CHARLEY 1955-05-12 [id number]   Assessment- CSW completed outreach call to cancel and reschedule home visit today due to schedule complications. Patient answered and expressed understanding. CSW rescheduled home visit for next week.  Plan-CSW will follow up within one week.  [name], BSW, MSW, [email or web address] Phone: [phone] Fax: [phone]

## 2016-07-16 NOTE — Patient Outreach (Signed)
[place] [facility]) Care Management  07/16/2016   Brooke Stafford Sep 29, 1955 [id number]  Subjective:  I have been doing pretty good. stillgetting use to the dialysis  Objective:  Telephonic encounter  Current Medications:  Current Outpatient Prescriptions  Medication Sig Dispense Refill  . acetaminophen-codeine (TYLENOL #3) 300-30 MG tablet Take 1 tablet by mouth 2 (two) times daily as needed for moderate pain.    [name] albuterol (PROVENTIL HFA;VENTOLIN HFA) 108 (90 Base) MCG/ACT inhaler Inhale 2 puffs into the lungs every 6 (six) hours as needed for wheezing or shortness of breath. 3 Inhaler 3  . albuterol (PROVENTIL) (2.5 MG/3ML) 0.083% nebulizer solution Take 3 mLs (2.5 mg total) by nebulization every 6 (six) hours as needed for wheezing or shortness of breath. 150 mL 3  . allopurinol (ZYLOPRIM) 300 MG tablet Take 1 tablet (300 mg total) by mouth daily. 90 tablet 3  . aspirin 81 MG chewable tablet Chew 1 tablet (81 mg total) by mouth daily. 30 tablet 10  . atorvastatin (LIPITOR) 40 MG tablet Take 1 tablet (40 mg total) by mouth daily. (Patient taking differently: Take 40 mg by mouth every evening. ) 30 tablet 3  . benzonatate (TESSALON) 100 MG capsule Take 100 mg by mouth 3 (three) times daily as needed for cough.  0  . calcitRIOL (ROCALTROL) 0.25 MCG capsule Take 0.25 mcg by mouth daily.  0  . citalopram (CELEXA) 20 MG tablet Take 1 tablet (20 mg total) by mouth daily. 30 tablet 3  . cloNIDine (CATAPRES) 0.2 MG tablet Take 1 tablet (0.2 mg total) by mouth 3 (three) times daily. 90 tablet 3  . clopidogrel (PLAVIX) 75 MG tablet Take 1 tablet (75 mg total) by mouth daily. 90 tablet 3  . Darbepoetin Alfa (ARANESP) 200 MCG/0.4ML SOSY injection Inject 0.4 mLs (200 mcg total) into the vein every Tuesday with hemodialysis. 1.68 mL   . ezetimibe (ZETIA) 10 MG tablet Take 1 tablet (10 mg total) by mouth daily. 90 tablet 3  . ferric gluconate 125 mg in sodium chloride 0.9 % 100 mL Inject  125 mg into the vein Every Tuesday,Thursday,and Saturday with dialysis.    . fluticasone (FLONASE) 50 MCG/ACT nasal spray Place 2 sprays into both nostrils daily as needed for allergies or rhinitis. 16 g 3  . gabapentin (NEURONTIN) 400 MG capsule Take 400 mg by mouth 2 (two) times daily.    . hydrOXYzine (ATARAX/VISTARIL) 25 MG tablet Take 1 tablet (25 mg total) by mouth daily as needed for anxiety or itching.    . insulin aspart (NOVOLOG) 100 UNIT/ML injection Inject 15-20 Units into the skin 3 (three) times daily before meals. Inject as per sliding scale: 0-150=0 units, 151+=5 units     . Insulin Glargine (LANTUS SOLOSTAR) 100 UNIT/ML Solostar Pen Inject 25 units into the skin at bedtime. 10 pen 3  . isosorbide mononitrate (IMDUR) 60 MG 24 hr tablet Take 60 mg by mouth daily.    [name] levothyroxine (SYNTHROID, LEVOTHROID) 25 MCG tablet take 61mcg by mouth every morning BEFORE BREAKFAST 30 tablet 2  . loratadine (CLARITIN) 10 MG tablet Take 10 mg by mouth daily.    . metoprolol tartrate (LOPRESSOR) 25 MG tablet Take 1 tablet (25 mg total) by mouth 2 (two) times daily. 60 tablet 0  . Naphazoline HCl (CLEAR EYES OP) Place 1 drop into both eyes as needed (for dry eyes).    . naproxen (NAPROSYN) 500 MG tablet Take 500 mg by mouth daily as needed for  moderate pain.    . nitroGLYCERIN (NITROSTAT) 0.4 MG SL tablet Place 1 tablet (0.4 mg total) under the tongue every 5 (five) minutes as needed. (Patient taking differently: Place 0.4 mg under the tongue every 5 (five) minutes as needed for chest pain. ) 30 tablet 2  . oxyCODONE-acetaminophen (PERCOCET/ROXICET) 5-325 MG tablet Take 1 tablet by mouth every 6 (six) hours as needed. 20 tablet 0  . prednisoLONE acetate (PRED FORTE) 1 % ophthalmic suspension Place 1 drop into both eyes 2 (two) times daily.   1  . ranitidine (ZANTAC 75) 75 MG tablet Take 1 tablet (75 mg total) by mouth daily as needed for heartburn.    . senna-docusate (SENOKOT-S) 8.6-50 MG tablet Take  1 tablet by mouth at bedtime as needed for mild constipation. 30 tablet 0  . sevelamer (RENAGEL) 800 MG tablet Take 2,400 mg by mouth 3 (three) times daily with meals.    . triamcinolone ointment (KENALOG) 0.5 % Apply 1 application topically daily.     No current facility-administered medications for this visit.     Functional Status:  In your present state of health, do you have any difficulty performing the following activities: 06/26/2016 06/18/2016  Hearing? - N  Vision? - Y  Difficulty concentrating or making decisions? - N  Walking or climbing stairs? Y Y  Dressing or bathing? - N  Doing errands, shopping? - Y  Conservation officer, nature and eating ? - N  Using the Toilet? - N  In the past six months, have you accidently leaked urine? - N  Do you have problems with loss of bowel control? - N  Managing your Medications? - N  Managing your Finances? - N  Housekeeping or managing your Housekeeping? - Y  Some recent data might be hidden    Fall/Depression Screening: PHQ 2/9 Scores 07/09/2016 06/18/2016 05/15/2016 04/26/2016 04/03/2016 03/11/2016 02/23/2016  PHQ - 2 Score 4 0 2 2 0 2 6  PHQ- 9 Score 16 - 8 9 - 6 27    Assessment:  Patient reports conitnuing to recover, continuing treatment for renal failure Patient reports her blood pressures have been running better.  Plan:  Telephone contact with patient in the next 21 days for assessment of community care care coordination needs

## 2016-07-17 ENCOUNTER — Other Ambulatory Visit: Payer: Self-pay

## 2016-07-17 DIAGNOSIS — Z23 Encounter for immunization: Secondary | ICD-10-CM | POA: Diagnosis not present

## 2016-07-17 DIAGNOSIS — N2581 Secondary hyperparathyroidism of renal origin: Secondary | ICD-10-CM | POA: Diagnosis not present

## 2016-07-17 DIAGNOSIS — E1129 Type 2 diabetes mellitus with other diabetic kidney complication: Secondary | ICD-10-CM | POA: Diagnosis not present

## 2016-07-17 DIAGNOSIS — E039 Hypothyroidism, unspecified: Secondary | ICD-10-CM | POA: Diagnosis not present

## 2016-07-17 DIAGNOSIS — N186 End stage renal disease: Secondary | ICD-10-CM | POA: Diagnosis not present

## 2016-07-17 DIAGNOSIS — D509 Iron deficiency anemia, unspecified: Secondary | ICD-10-CM | POA: Diagnosis not present

## 2016-07-17 MED ORDER — INSULIN DETEMIR 100 UNIT/ML FLEXPEN: PEN_INJECTOR | SUBCUTANEOUS | 1 refills | Status: DC

## 2016-07-18 ENCOUNTER — Encounter: Payer: Self-pay | Admitting: Vascular Surgery

## 2016-07-18 ENCOUNTER — Ambulatory Visit (INDEPENDENT_AMBULATORY_CARE_PROVIDER_SITE_OTHER): Payer: Self-pay | Admitting: Vascular Surgery

## 2016-07-18 VITALS — BP 104/63 | HR 52 | Temp 98.2°F | Resp 18 | Ht 70.0 in | Wt 213.0 lb

## 2016-07-18 DIAGNOSIS — Z992 Dependence on renal dialysis: Secondary | ICD-10-CM

## 2016-07-18 DIAGNOSIS — N186 End stage renal disease: Secondary | ICD-10-CM

## 2016-07-18 NOTE — Progress Notes (Signed)
Patient name: Brooke Stafford MRN: [id number] DOB: Aug 16, 1955 Sex: female  REASON FOR VISIT: Here today for follow-up of second stage basilic vein transpositionyj  HPI: Brooke Stafford is a 61 y.o. female here today for follow-up of basilic vein transposition second stage left arm. She did have a stitch abscess in the Vicryl sutures and was on antibiotics. This is completely resolved  Current Outpatient Prescriptions  Medication Sig Dispense Refill  . acetaminophen-codeine (TYLENOL #3) 300-30 MG tablet Take 1 tablet by mouth 2 (two) times daily as needed for moderate pain.    [name] albuterol (PROVENTIL HFA;VENTOLIN HFA) 108 (90 Base) MCG/ACT inhaler Inhale 2 puffs into the lungs every 6 (six) hours as needed for wheezing or shortness of breath. 3 Inhaler 3  . albuterol (PROVENTIL) (2.5 MG/3ML) 0.083% nebulizer solution Take 3 mLs (2.5 mg total) by nebulization every 6 (six) hours as needed for wheezing or shortness of breath. 150 mL 3  . allopurinol (ZYLOPRIM) 300 MG tablet Take 1 tablet (300 mg total) by mouth daily. 90 tablet 3  . aspirin 81 MG chewable tablet Chew 1 tablet (81 mg total) by mouth daily. 30 tablet 10  . atorvastatin (LIPITOR) 40 MG tablet Take 1 tablet (40 mg total) by mouth daily. (Patient taking differently: Take 40 mg by mouth every evening. ) 30 tablet 3  . benzonatate (TESSALON) 100 MG capsule Take 100 mg by mouth 3 (three) times daily as needed for cough.  0  . calcitRIOL (ROCALTROL) 0.25 MCG capsule Take 0.25 mcg by mouth daily.  0  . citalopram (CELEXA) 20 MG tablet Take 1 tablet (20 mg total) by mouth daily. 30 tablet 3  . cloNIDine (CATAPRES) 0.2 MG tablet Take 1 tablet (0.2 mg total) by mouth 3 (three) times daily. 90 tablet 3  . clopidogrel (PLAVIX) 75 MG tablet Take 1 tablet (75 mg total) by mouth daily. 90 tablet 3  . Darbepoetin Alfa (ARANESP) 200 MCG/0.4ML SOSY injection Inject 0.4 mLs (200 mcg total) into the vein every Tuesday  with hemodialysis. 1.68 mL   . ezetimibe (ZETIA) 10 MG tablet Take 1 tablet (10 mg total) by mouth daily. 90 tablet 3  . ferric gluconate 125 mg in sodium chloride 0.9 % 100 mL Inject 125 mg into the vein Every Tuesday,Thursday,and Saturday with dialysis.    . fluticasone (FLONASE) 50 MCG/ACT nasal spray Place 2 sprays into both nostrils daily as needed for allergies or rhinitis. 16 g 3  . gabapentin (NEURONTIN) 400 MG capsule Take 400 mg by mouth 2 (two) times daily.    . hydrOXYzine (ATARAX/VISTARIL) 25 MG tablet Take 1 tablet (25 mg total) by mouth daily as needed for anxiety or itching.    . insulin aspart (NOVOLOG) 100 UNIT/ML injection Inject 15-20 Units into the skin 3 (three) times daily before meals. Inject as per sliding scale: 0-150=0 units, 151+=5 units     . Insulin Detemir (LEVEMIR FLEXPEN) 100 UNIT/ML Pen Inject 25 units into the skin at bedtime. 10 pen 1  . Insulin Glargine (LANTUS SOLOSTAR) 100 UNIT/ML Solostar Pen Inject 25 units into the skin at bedtime. 10 pen 3  . isosorbide mononitrate (IMDUR) 60 MG 24 hr tablet Take 60 mg by mouth daily.    [name] levothyroxine (SYNTHROID, LEVOTHROID) 25 MCG tablet take 27mcg by mouth every morning BEFORE BREAKFAST 30 tablet 2  . loratadine (CLARITIN) 10 MG tablet Take 10 mg by mouth daily.    . metoprolol tartrate (LOPRESSOR) 25 MG tablet Take 1  tablet (25 mg total) by mouth 2 (two) times daily. 60 tablet 0  . Naphazoline HCl (CLEAR EYES OP) Place 1 drop into both eyes as needed (for dry eyes).    . naproxen (NAPROSYN) 500 MG tablet Take 500 mg by mouth daily as needed for moderate pain.    . nitroGLYCERIN (NITROSTAT) 0.4 MG SL tablet Place 1 tablet (0.4 mg total) under the tongue every 5 (five) minutes as needed. (Patient taking differently: Place 0.4 mg under the tongue every 5 (five) minutes as needed for chest pain. ) 30 tablet 2  . oxyCODONE-acetaminophen (PERCOCET/ROXICET) 5-325 MG tablet Take 1 tablet by mouth every 6 (six) hours as needed.  20 tablet 0  . prednisoLONE acetate (PRED FORTE) 1 % ophthalmic suspension Place 1 drop into both eyes 2 (two) times daily.   1  . ranitidine (ZANTAC 75) 75 MG tablet Take 1 tablet (75 mg total) by mouth daily as needed for heartburn.    . senna-docusate (SENOKOT-S) 8.6-50 MG tablet Take 1 tablet by mouth at bedtime as needed for mild constipation. 30 tablet 0  . sevelamer (RENAGEL) 800 MG tablet Take 2,400 mg by mouth 3 (three) times daily with meals.    . triamcinolone ointment (KENALOG) 0.5 % Apply 1 application topically daily.     No current facility-administered medications for this visit.      PHYSICAL EXAM: Vitals:   07/18/16 0933  BP: 104/63  Pulse: (!) 52  Resp: 18  Temp: 98.2 F (36.8 C)  TempSrc: Oral  SpO2: 100%  Weight: 213 lb (96.6 kg)  Height: 5\' 10"  (1.778 m)    GENERAL: The patient is a well-nourished female, in no acute distress. The vital signs are documented above. Incisions are all well-healed. Excellent maturation of her fistula with good size with a very superficial orientation.  MEDICAL ISSUES: She is now 3 weeks out from transposition of her fistula. I would recommend that we wait for an additional 3 weeks. Should be available for use of her fistula at the end of April. She is currently having good function of her hemodialysis catheter. He has no steal symptoms and has excellent radial pulse. She will see [place] again on as-needed basis   Rosetta Posner, MD [facility] Tel [phone] Pager [phone]

## 2016-07-19 DIAGNOSIS — D509 Iron deficiency anemia, unspecified: Secondary | ICD-10-CM | POA: Diagnosis not present

## 2016-07-19 DIAGNOSIS — E1129 Type 2 diabetes mellitus with other diabetic kidney complication: Secondary | ICD-10-CM | POA: Diagnosis not present

## 2016-07-19 DIAGNOSIS — N2581 Secondary hyperparathyroidism of renal origin: Secondary | ICD-10-CM | POA: Diagnosis not present

## 2016-07-19 DIAGNOSIS — N186 End stage renal disease: Secondary | ICD-10-CM | POA: Diagnosis not present

## 2016-07-19 DIAGNOSIS — Z23 Encounter for immunization: Secondary | ICD-10-CM | POA: Diagnosis not present

## 2016-07-22 ENCOUNTER — Other Ambulatory Visit: Payer: Self-pay | Admitting: Licensed Clinical Social Worker

## 2016-07-22 DIAGNOSIS — E1129 Type 2 diabetes mellitus with other diabetic kidney complication: Secondary | ICD-10-CM | POA: Diagnosis not present

## 2016-07-22 DIAGNOSIS — Z23 Encounter for immunization: Secondary | ICD-10-CM | POA: Diagnosis not present

## 2016-07-22 DIAGNOSIS — N186 End stage renal disease: Secondary | ICD-10-CM | POA: Diagnosis not present

## 2016-07-22 DIAGNOSIS — N2581 Secondary hyperparathyroidism of renal origin: Secondary | ICD-10-CM | POA: Diagnosis not present

## 2016-07-22 DIAGNOSIS — D509 Iron deficiency anemia, unspecified: Secondary | ICD-10-CM | POA: Diagnosis not present

## 2016-07-22 NOTE — Patient Outreach (Signed)
[place] [facility]) Care Management  07/22/2016  CRISTAN HOUT 05-11-1955 [id number]  Assessment- CSW completed outreach call to patient. She answered. She shares that she is doing good and continues to not experience any depressive symptoms at this time. CSW rescheduled home visit for this week for 07/25/16. CSW informed patient that [facility] CSW Raynaldo Opitz will be joining the visit as well.  Plan-CSW will complete home visit this week and will continue to provide social work assistance and support.  [name], BSW, MSW, [email or web address] Phone: [phone] Fax: [phone]

## 2016-07-23 ENCOUNTER — Other Ambulatory Visit: Payer: Self-pay

## 2016-07-23 ENCOUNTER — Ambulatory Visit: Payer: Self-pay | Admitting: Licensed Clinical Social Worker

## 2016-07-23 DIAGNOSIS — H359 Unspecified retinal disorder: Secondary | ICD-10-CM | POA: Diagnosis not present

## 2016-07-23 DIAGNOSIS — E113412 Type 2 diabetes mellitus with severe nonproliferative diabetic retinopathy with macular edema, left eye: Secondary | ICD-10-CM | POA: Diagnosis not present

## 2016-07-23 DIAGNOSIS — E113411 Type 2 diabetes mellitus with severe nonproliferative diabetic retinopathy with macular edema, right eye: Secondary | ICD-10-CM | POA: Diagnosis not present

## 2016-07-23 DIAGNOSIS — H3561 Retinal hemorrhage, right eye: Secondary | ICD-10-CM | POA: Diagnosis not present

## 2016-07-24 DIAGNOSIS — D509 Iron deficiency anemia, unspecified: Secondary | ICD-10-CM | POA: Diagnosis not present

## 2016-07-24 DIAGNOSIS — E1129 Type 2 diabetes mellitus with other diabetic kidney complication: Secondary | ICD-10-CM | POA: Diagnosis not present

## 2016-07-24 DIAGNOSIS — Z23 Encounter for immunization: Secondary | ICD-10-CM | POA: Diagnosis not present

## 2016-07-24 DIAGNOSIS — N2581 Secondary hyperparathyroidism of renal origin: Secondary | ICD-10-CM | POA: Diagnosis not present

## 2016-07-24 DIAGNOSIS — N186 End stage renal disease: Secondary | ICD-10-CM | POA: Diagnosis not present

## 2016-07-24 NOTE — Patient Outreach (Signed)
   Unsuccessful attempt made to contact with patient via telephone to assess for community care coordination needs. Plans: Make another attempt to contact in the next 10 days.

## 2016-07-25 ENCOUNTER — Other Ambulatory Visit: Payer: Self-pay | Admitting: Licensed Clinical Social Worker

## 2016-07-25 NOTE — Patient Outreach (Signed)
[place] [facility]) Care Management  07/25/2016  Brooke Stafford [birth year] [id number]  Assessment- CSW received incoming call from patient on 07/25/16. Patient provided HIPPA verifications. Patient informed CSW that she received a hepatitis B shot yesterday and she woke up today with chills and upset stomach. Patient wishes to cancel home visit today and reschedule next week. CSW provided emotional support over the phone.  Plan-CSW will follow up within one week and will continue to provide social assistance and support.  [name], BSW, MSW, [email or web address] Phone: [phone] Fax: [phone]

## 2016-07-26 DIAGNOSIS — N2581 Secondary hyperparathyroidism of renal origin: Secondary | ICD-10-CM | POA: Diagnosis not present

## 2016-07-26 DIAGNOSIS — D509 Iron deficiency anemia, unspecified: Secondary | ICD-10-CM | POA: Diagnosis not present

## 2016-07-26 DIAGNOSIS — E1129 Type 2 diabetes mellitus with other diabetic kidney complication: Secondary | ICD-10-CM | POA: Diagnosis not present

## 2016-07-26 DIAGNOSIS — Z23 Encounter for immunization: Secondary | ICD-10-CM | POA: Diagnosis not present

## 2016-07-26 DIAGNOSIS — N186 End stage renal disease: Secondary | ICD-10-CM | POA: Diagnosis not present

## 2016-07-29 ENCOUNTER — Other Ambulatory Visit: Payer: Self-pay | Admitting: Licensed Clinical Social Worker

## 2016-07-29 DIAGNOSIS — N2581 Secondary hyperparathyroidism of renal origin: Secondary | ICD-10-CM | POA: Diagnosis not present

## 2016-07-29 DIAGNOSIS — E1129 Type 2 diabetes mellitus with other diabetic kidney complication: Secondary | ICD-10-CM | POA: Diagnosis not present

## 2016-07-29 DIAGNOSIS — Z23 Encounter for immunization: Secondary | ICD-10-CM | POA: Diagnosis not present

## 2016-07-29 DIAGNOSIS — D509 Iron deficiency anemia, unspecified: Secondary | ICD-10-CM | POA: Diagnosis not present

## 2016-07-29 DIAGNOSIS — N186 End stage renal disease: Secondary | ICD-10-CM | POA: Diagnosis not present

## 2016-07-29 NOTE — Patient Outreach (Signed)
[place] [facility]) Care Management  07/29/2016  Brooke Stafford 1956/03/06 [id number]  Assessment-CSW completed outreach attempt today. CSW unable to reach patient successfully. CSW left a HIPPA compliant voice message encouraging patient to return call once available.  Plan-CSW will await return call or complete an additional outreach if needed.  [name], BSW, MSW, [email or web address] Phone: [phone] Fax: [phone]

## 2016-07-31 DIAGNOSIS — D509 Iron deficiency anemia, unspecified: Secondary | ICD-10-CM | POA: Diagnosis not present

## 2016-07-31 DIAGNOSIS — N186 End stage renal disease: Secondary | ICD-10-CM | POA: Diagnosis not present

## 2016-07-31 DIAGNOSIS — N2581 Secondary hyperparathyroidism of renal origin: Secondary | ICD-10-CM | POA: Diagnosis not present

## 2016-07-31 DIAGNOSIS — E1129 Type 2 diabetes mellitus with other diabetic kidney complication: Secondary | ICD-10-CM | POA: Diagnosis not present

## 2016-07-31 DIAGNOSIS — Z23 Encounter for immunization: Secondary | ICD-10-CM | POA: Diagnosis not present

## 2016-08-01 ENCOUNTER — Other Ambulatory Visit: Payer: Self-pay | Admitting: Licensed Clinical Social Worker

## 2016-08-01 ENCOUNTER — Other Ambulatory Visit: Payer: Self-pay

## 2016-08-01 NOTE — Patient Outreach (Signed)
[place] [facility]) Care Management  08/01/2016  MURRELL ELIZONDO December 31, 1955 [id number]  Assessment- CSW completed another outreach call to patient and she answered. Patient gave CSW permission to log on her account for [employer]. CSW was able to confirm that patient IS on the wait list for Public Housing for 1 bed and that she was put on the wait list on 06/04/16. CSW encouraged her to still make attempts to [employer] to get more information.  Plan-CSW will follow up within two-three weeks.  [name], BSW, MSW, [email or web address] Phone: [phone] Fax: [phone]

## 2016-08-01 NOTE — Patient Outreach (Signed)
[facility]) Care Management  08/01/2016  MALAYIA SPIZZIRRI 1956-03-10 [id number]   Assessment- CSW received email from [place] stating that patient would like for CSW to contact her. CSW completed call to patient and she answered. She reports that she cannot log on [employer] and cannot find her login information. CSW provided patient with correct login information and patient was able to log in successfully. However, patient has not received anything in the mail since [place] assisted her with putting her on the wait list for [employer]. CSW completed call to [employer] but office is closed today and CSW was unable to leave a voice message. CSW encouraged patient to make outreach to [employer].  Plan-CSW will follow up with patient within two-three weeks and continue to assist her with social work needs.  [name], BSW, MSW, [email or web address] Phone: [phone] Fax: [phone]

## 2016-08-02 DIAGNOSIS — E1129 Type 2 diabetes mellitus with other diabetic kidney complication: Secondary | ICD-10-CM | POA: Diagnosis not present

## 2016-08-02 DIAGNOSIS — N2581 Secondary hyperparathyroidism of renal origin: Secondary | ICD-10-CM | POA: Diagnosis not present

## 2016-08-02 DIAGNOSIS — D509 Iron deficiency anemia, unspecified: Secondary | ICD-10-CM | POA: Diagnosis not present

## 2016-08-02 DIAGNOSIS — Z23 Encounter for immunization: Secondary | ICD-10-CM | POA: Diagnosis not present

## 2016-08-02 DIAGNOSIS — N186 End stage renal disease: Secondary | ICD-10-CM | POA: Diagnosis not present

## 2016-08-02 NOTE — Patient Outreach (Signed)
[facility]) Care Management   08/02/2016  Brooke Stafford 1956-01-01 [id number]  Brooke Stafford is an 61 y.o. female  Subjective:  I am doing so much better since I started dialysis and they took me off most of those blood pressure medications.  Objective:   ROS Patient able to ambulate without walker. Patient very pleasant, talkative.  Physical Exam  ROS  Encounter Medications:   Outpatient Encounter Prescriptions as of 08/01/2016  Medication Sig Note  . acetaminophen-codeine (TYLENOL #3) 300-30 MG tablet Take 1 tablet by mouth 2 (two) times daily as needed for moderate pain.   [name] albuterol (PROVENTIL HFA;VENTOLIN HFA) 108 (90 Base) MCG/ACT inhaler Inhale 2 puffs into the lungs every 6 (six) hours as needed for wheezing or shortness of breath.   [name] albuterol (PROVENTIL) (2.5 MG/3ML) 0.083% nebulizer solution Take 3 mLs (2.5 mg total) by nebulization every 6 (six) hours as needed for wheezing or shortness of breath.   . allopurinol (ZYLOPRIM) 300 MG tablet Take 1 tablet (300 mg total) by mouth daily.   [name] aspirin 81 MG chewable tablet Chew 1 tablet (81 mg total) by mouth daily.   [name] atorvastatin (LIPITOR) 40 MG tablet Take 1 tablet (40 mg total) by mouth daily. (Patient taking differently: Take 40 mg by mouth every evening. )   . benzonatate (TESSALON) 100 MG capsule Take 100 mg by mouth 3 (three) times daily as needed for cough.   . calcitRIOL (ROCALTROL) 0.25 MCG capsule Take 0.25 mcg by mouth daily.   . citalopram (CELEXA) 20 MG tablet Take 1 tablet (20 mg total) by mouth daily.   . cloNIDine (CATAPRES) 0.2 MG tablet Take 1 tablet (0.2 mg total) by mouth 3 (three) times daily. 07/09/2016: Does not take on HD days  . clopidogrel (PLAVIX) 75 MG tablet Take 1 tablet (75 mg total) by mouth daily.   . Darbepoetin Alfa (ARANESP) 200 MCG/0.4ML SOSY injection Inject 0.4 mLs (200 mcg total) into the vein every Tuesday with hemodialysis.   [name] ezetimibe (ZETIA) 10 MG tablet  Take 1 tablet (10 mg total) by mouth daily.   . ferric gluconate 125 mg in sodium chloride 0.9 % 100 mL Inject 125 mg into the vein Every Tuesday,Thursday,and Saturday with dialysis.   . fluticasone (FLONASE) 50 MCG/ACT nasal spray Place 2 sprays into both nostrils daily as needed for allergies or rhinitis.   [name] gabapentin (NEURONTIN) 400 MG capsule Take 400 mg by mouth 2 (two) times daily.   . hydrOXYzine (ATARAX/VISTARIL) 25 MG tablet Take 1 tablet (25 mg total) by mouth daily as needed for anxiety or itching.   . insulin aspart (NOVOLOG) 100 UNIT/ML injection Inject 15-20 Units into the skin 3 (three) times daily before meals. Inject as per sliding scale: 0-150=0 units, 151+=5 units    . Insulin Detemir (LEVEMIR FLEXPEN) 100 UNIT/ML Pen Inject 25 units into the skin at bedtime.   . Insulin Glargine (LANTUS SOLOSTAR) 100 UNIT/ML Solostar Pen Inject 25 units into the skin at bedtime.   . isosorbide mononitrate (IMDUR) 60 MG 24 hr tablet Take 60 mg by mouth daily.   [name] levothyroxine (SYNTHROID, LEVOTHROID) 25 MCG tablet take 24mg by mouth every morning BEFORE BREAKFAST   . loratadine (CLARITIN) 10 MG tablet Take 10 mg by mouth daily.   . metoprolol tartrate (LOPRESSOR) 25 MG tablet Take 1 tablet (25 mg total) by mouth 2 (two) times daily.   . Naphazoline HCl (CLEAR EYES OP) Place 1 drop into both  eyes as needed (for dry eyes).   . naproxen (NAPROSYN) 500 MG tablet Take 500 mg by mouth daily as needed for moderate pain.   . nitroGLYCERIN (NITROSTAT) 0.4 MG SL tablet Place 1 tablet (0.4 mg total) under the tongue every 5 (five) minutes as needed. (Patient taking differently: Place 0.4 mg under the tongue every 5 (five) minutes as needed for chest pain. )   . oxyCODONE-acetaminophen (PERCOCET/ROXICET) 5-325 MG tablet Take 1 tablet by mouth every 6 (six) hours as needed.   . prednisoLONE acetate (PRED FORTE) 1 % ophthalmic suspension Place 1 drop into both eyes 2 (two) times daily.    . ranitidine  (ZANTAC 75) 75 MG tablet Take 1 tablet (75 mg total) by mouth daily as needed for heartburn.   . senna-docusate (SENOKOT-S) 8.6-50 MG tablet Take 1 tablet by mouth at bedtime as needed for mild constipation.   . sevelamer (RENAGEL) 800 MG tablet Take 2,400 mg by mouth 3 (three) times daily with meals.   . triamcinolone ointment (KENALOG) 0.5 % Apply 1 application topically daily.    No facility-administered encounter medications on file as of 08/01/2016.     Functional Status:   In your present state of health, do you have any difficulty performing the following activities: 06/26/2016 06/18/2016  Hearing? - N  Vision? - Y  Difficulty concentrating or making decisions? - N  Walking or climbing stairs? Y Y  Dressing or bathing? - N  Doing errands, shopping? - Y  Conservation officer, nature and eating ? - N  Using the Toilet? - N  In the past six months, have you accidently leaked urine? - N  Do you have problems with loss of bowel control? - N  Managing your Medications? - N  Managing your Finances? - N  Housekeeping or managing your Housekeeping? - Y  Some recent data might be hidden    Fall/Depression Screening:    PHQ 2/9 Scores 07/09/2016 06/18/2016 05/15/2016 04/26/2016 04/03/2016 03/11/2016 02/23/2016  PHQ - 2 Score 4 0 2 2 0 2 6  PHQ- 9 Score 16 - 8 9 - 6 27   THN CM Care Plan Problem One     Most Recent Value  Care Plan Problem One  Patient had 2 recent hospitalizations for COPD exacerbation  Role Documenting the Problem One  Care Management [place] for Problem One  Active  THN Long Term Goal (31-90 days)  In the next 31 days, patient will have no acute care admissions for COPD exacerbation  THN Long Term Goal Start Date  06/18/16  [facility] Long Term Goal Met Date  08/01/16  Interventions for Problem One Long Term Goal  routine home visit to assess her progression in meeting her case management goals  THN CM Short Term Goal #1 (0-30 days)  In the next 14 days, patient will meet with  [facility] RNCM for COPD education, need for community care coordination  [facility] CM Short Term Goal #1 Start Date  06/18/16  [facility] CM Short Term Goal #1 Met Date  08/01/16  Interventions for Short Term Goal #1  routine home visit/  Patient and this RNCM  met for heart failure educaiton.  Patient remains on a fluid restriction of 1.5 liters per day.      THN CM Care Plan Problem Two     Most Recent Value  Care Plan Problem Two  Patient lives in home with mold and mildew  Role Documenting the Problem Two  Care Management  Coordinator  Care Plan for Problem Two  Active  THN CM Short Term Goal #1 (0-30 days)  In the next 21 days, patient will meet with [facility] RNCM for review of community resources for  rental living  [facility] CM Short Term Goal #1 Start Date  06/18/16  [facility] CM Short Term Goal #1 Met Date   08/01/16  Interventions for Short Term Goal #2   patient met with [facility] RNCM  to assess rental accessibiilty in this community      Assessment:   Patient has met all her case management goals.  Patient reports improved mental and physical health since starting hemolysis  Plan:  Home visit in the next 30 for discharage

## 2016-08-05 DIAGNOSIS — E1129 Type 2 diabetes mellitus with other diabetic kidney complication: Secondary | ICD-10-CM | POA: Diagnosis not present

## 2016-08-05 DIAGNOSIS — D509 Iron deficiency anemia, unspecified: Secondary | ICD-10-CM | POA: Diagnosis not present

## 2016-08-05 DIAGNOSIS — Z23 Encounter for immunization: Secondary | ICD-10-CM | POA: Diagnosis not present

## 2016-08-05 DIAGNOSIS — N2581 Secondary hyperparathyroidism of renal origin: Secondary | ICD-10-CM | POA: Diagnosis not present

## 2016-08-05 DIAGNOSIS — N186 End stage renal disease: Secondary | ICD-10-CM | POA: Diagnosis not present

## 2016-08-06 ENCOUNTER — Other Ambulatory Visit: Payer: Self-pay | Admitting: Licensed Clinical Social Worker

## 2016-08-06 NOTE — Patient Outreach (Signed)
[facility]) Care Management  08/06/2016  MENDE BISWELL [birth year] [id number]  Assessment-CSW completed outreach attempt today. CSW unable to reach patient successfully. CSW left a HIPPA compliant voice message encouraging patient to return call once available.  Plan-CSW will await return call or complete an additional outreach if needed.  [name], BSW, MSW, [email or web address] Phone: [phone] Fax: [phone]

## 2016-08-07 ENCOUNTER — Other Ambulatory Visit: Payer: Self-pay | Admitting: Licensed Clinical Social Worker

## 2016-08-07 DIAGNOSIS — E1129 Type 2 diabetes mellitus with other diabetic kidney complication: Secondary | ICD-10-CM | POA: Diagnosis not present

## 2016-08-07 DIAGNOSIS — N2581 Secondary hyperparathyroidism of renal origin: Secondary | ICD-10-CM | POA: Diagnosis not present

## 2016-08-07 DIAGNOSIS — D509 Iron deficiency anemia, unspecified: Secondary | ICD-10-CM | POA: Diagnosis not present

## 2016-08-07 DIAGNOSIS — N186 End stage renal disease: Secondary | ICD-10-CM | POA: Diagnosis not present

## 2016-08-07 DIAGNOSIS — Z23 Encounter for immunization: Secondary | ICD-10-CM | POA: Diagnosis not present

## 2016-08-07 NOTE — Patient Outreach (Signed)
[place] [facility]) Care Management  08/07/2016  Brooke Stafford [birth year] [id number]  Assessment- CSW received return call from patient. Patient stated that she has dialysis today. CSW was unable to reschedule home visit from 07/25/16 when patient had to cancel. CSW wished to reschedule home visit but patient shares that she would prefer phone calls for the time being as she has a lot of upcoming appointments within the next few weeks. Patient shares that she is doing well and is appreciative for social work assistance with gaining public housing login information. She shares that she was able to log in herself and that she wrote her username and password down and will keep information safe for her to have in the future. Patient denies any other issues or complaints at this time.  Plan-CSW will follow up within two weeks and will continue to provide social work support.  [name], BSW, MSW, [email or web address] Phone: [phone] Fax: [phone]

## 2016-08-08 ENCOUNTER — Encounter: Payer: Self-pay | Admitting: Cardiovascular Disease

## 2016-08-08 ENCOUNTER — Ambulatory Visit (INDEPENDENT_AMBULATORY_CARE_PROVIDER_SITE_OTHER): Payer: Medicare Other | Admitting: Cardiovascular Disease

## 2016-08-08 VITALS — BP 114/56 | HR 68 | Ht 70.0 in | Wt 216.0 lb

## 2016-08-08 DIAGNOSIS — Z9861 Coronary angioplasty status: Secondary | ICD-10-CM

## 2016-08-08 DIAGNOSIS — I2510 Atherosclerotic heart disease of native coronary artery without angina pectoris: Secondary | ICD-10-CM | POA: Diagnosis not present

## 2016-08-08 DIAGNOSIS — Z794 Long term (current) use of insulin: Secondary | ICD-10-CM

## 2016-08-08 DIAGNOSIS — E785 Hyperlipidemia, unspecified: Secondary | ICD-10-CM | POA: Diagnosis not present

## 2016-08-08 DIAGNOSIS — E0851 Diabetes mellitus due to underlying condition with diabetic peripheral angiopathy without gangrene: Secondary | ICD-10-CM

## 2016-08-08 DIAGNOSIS — N186 End stage renal disease: Secondary | ICD-10-CM

## 2016-08-08 DIAGNOSIS — I5042 Chronic combined systolic (congestive) and diastolic (congestive) heart failure: Secondary | ICD-10-CM | POA: Diagnosis not present

## 2016-08-08 DIAGNOSIS — I739 Peripheral vascular disease, unspecified: Secondary | ICD-10-CM | POA: Diagnosis not present

## 2016-08-08 MED ORDER — METOPROLOL TARTRATE 25 MG PO TABS: 12.5000 mg | ORAL_TABLET | Freq: Two times a day (BID) | ORAL | 3 refills | Status: DC

## 2016-08-08 NOTE — Patient Instructions (Signed)
Dr Sallyanne Kuster has recommended making the following medication changes: 1. TAKE Metoprolol 0.5 tablet (12.5 mg total) by mouth TWICE daily 2. WEAN OFF Isosorbide - take 0.5 tablet (30 mg total) by mouth daily for 10 days the STOP all together  Your physician recommends that you schedule a follow-up appointment in 6 months. You will receive a reminder letter in the mail two months in advance. If you don't receive a letter, please call our office to schedule the follow-up appointment.  If you need a refill on your cardiac medications before your next appointment, please call your pharmacy.
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
## 2016-08-09 DIAGNOSIS — Z23 Encounter for immunization: Secondary | ICD-10-CM | POA: Diagnosis not present

## 2016-08-09 DIAGNOSIS — E1129 Type 2 diabetes mellitus with other diabetic kidney complication: Secondary | ICD-10-CM | POA: Diagnosis not present

## 2016-08-09 DIAGNOSIS — N186 End stage renal disease: Secondary | ICD-10-CM | POA: Diagnosis not present

## 2016-08-09 DIAGNOSIS — D509 Iron deficiency anemia, unspecified: Secondary | ICD-10-CM | POA: Diagnosis not present

## 2016-08-09 DIAGNOSIS — N2581 Secondary hyperparathyroidism of renal origin: Secondary | ICD-10-CM | POA: Diagnosis not present

## 2016-08-12 DIAGNOSIS — D509 Iron deficiency anemia, unspecified: Secondary | ICD-10-CM | POA: Diagnosis not present

## 2016-08-12 DIAGNOSIS — N2581 Secondary hyperparathyroidism of renal origin: Secondary | ICD-10-CM | POA: Diagnosis not present

## 2016-08-12 DIAGNOSIS — E1129 Type 2 diabetes mellitus with other diabetic kidney complication: Secondary | ICD-10-CM | POA: Diagnosis not present

## 2016-08-12 DIAGNOSIS — Z23 Encounter for immunization: Secondary | ICD-10-CM | POA: Diagnosis not present

## 2016-08-12 DIAGNOSIS — I129 Hypertensive chronic kidney disease with stage 1 through stage 4 chronic kidney disease, or unspecified chronic kidney disease: Secondary | ICD-10-CM | POA: Diagnosis not present

## 2016-08-12 DIAGNOSIS — Z992 Dependence on renal dialysis: Secondary | ICD-10-CM | POA: Diagnosis not present

## 2016-08-12 DIAGNOSIS — N186 End stage renal disease: Secondary | ICD-10-CM | POA: Diagnosis not present

## 2016-08-13 ENCOUNTER — Ambulatory Visit: Payer: Medicare Other | Attending: Family Medicine | Admitting: Family Medicine

## 2016-08-13 ENCOUNTER — Encounter: Payer: Self-pay | Admitting: Family Medicine

## 2016-08-13 VITALS — BP 133/67 | HR 73 | Temp 98.4°F | Resp 16 | Ht 70.5 in | Wt 225.8 lb

## 2016-08-13 DIAGNOSIS — E669 Obesity, unspecified: Secondary | ICD-10-CM | POA: Insufficient documentation

## 2016-08-13 DIAGNOSIS — I2510 Atherosclerotic heart disease of native coronary artery without angina pectoris: Secondary | ICD-10-CM | POA: Insufficient documentation

## 2016-08-13 DIAGNOSIS — E785 Hyperlipidemia, unspecified: Secondary | ICD-10-CM | POA: Diagnosis not present

## 2016-08-13 DIAGNOSIS — Z880 Allergy status to penicillin: Secondary | ICD-10-CM | POA: Diagnosis not present

## 2016-08-13 DIAGNOSIS — Z992 Dependence on renal dialysis: Secondary | ICD-10-CM | POA: Insufficient documentation

## 2016-08-13 DIAGNOSIS — E1151 Type 2 diabetes mellitus with diabetic peripheral angiopathy without gangrene: Secondary | ICD-10-CM | POA: Diagnosis not present

## 2016-08-13 DIAGNOSIS — Z79899 Other long term (current) drug therapy: Secondary | ICD-10-CM | POA: Insufficient documentation

## 2016-08-13 DIAGNOSIS — K219 Gastro-esophageal reflux disease without esophagitis: Secondary | ICD-10-CM | POA: Diagnosis not present

## 2016-08-13 DIAGNOSIS — J449 Chronic obstructive pulmonary disease, unspecified: Secondary | ICD-10-CM | POA: Diagnosis not present

## 2016-08-13 DIAGNOSIS — Z794 Long term (current) use of insulin: Secondary | ICD-10-CM | POA: Insufficient documentation

## 2016-08-13 DIAGNOSIS — N186 End stage renal disease: Secondary | ICD-10-CM | POA: Diagnosis not present

## 2016-08-13 DIAGNOSIS — F419 Anxiety disorder, unspecified: Secondary | ICD-10-CM | POA: Diagnosis not present

## 2016-08-13 DIAGNOSIS — J42 Unspecified chronic bronchitis: Secondary | ICD-10-CM

## 2016-08-13 DIAGNOSIS — L089 Local infection of the skin and subcutaneous tissue, unspecified: Secondary | ICD-10-CM | POA: Insufficient documentation

## 2016-08-13 DIAGNOSIS — E0851 Diabetes mellitus due to underlying condition with diabetic peripheral angiopathy without gangrene: Secondary | ICD-10-CM | POA: Diagnosis not present

## 2016-08-13 DIAGNOSIS — E039 Hypothyroidism, unspecified: Secondary | ICD-10-CM | POA: Insufficient documentation

## 2016-08-13 DIAGNOSIS — L600 Ingrowing nail: Secondary | ICD-10-CM | POA: Diagnosis not present

## 2016-08-13 DIAGNOSIS — Z7902 Long term (current) use of antithrombotics/antiplatelets: Secondary | ICD-10-CM | POA: Diagnosis not present

## 2016-08-13 DIAGNOSIS — S90424A Blister (nonthermal), right lesser toe(s), initial encounter: Secondary | ICD-10-CM | POA: Diagnosis not present

## 2016-08-13 DIAGNOSIS — I132 Hypertensive heart and chronic kidney disease with heart failure and with stage 5 chronic kidney disease, or end stage renal disease: Secondary | ICD-10-CM | POA: Insufficient documentation

## 2016-08-13 DIAGNOSIS — F329 Major depressive disorder, single episode, unspecified: Secondary | ICD-10-CM | POA: Diagnosis not present

## 2016-08-13 DIAGNOSIS — Z8673 Personal history of transient ischemic attack (TIA), and cerebral infarction without residual deficits: Secondary | ICD-10-CM | POA: Insufficient documentation

## 2016-08-13 DIAGNOSIS — Z7982 Long term (current) use of aspirin: Secondary | ICD-10-CM | POA: Diagnosis not present

## 2016-08-13 DIAGNOSIS — I509 Heart failure, unspecified: Secondary | ICD-10-CM | POA: Diagnosis not present

## 2016-08-13 DIAGNOSIS — Z9861 Coronary angioplasty status: Secondary | ICD-10-CM

## 2016-08-13 DIAGNOSIS — I252 Old myocardial infarction: Secondary | ICD-10-CM | POA: Diagnosis not present

## 2016-08-13 DIAGNOSIS — E1122 Type 2 diabetes mellitus with diabetic chronic kidney disease: Secondary | ICD-10-CM | POA: Insufficient documentation

## 2016-08-13 DIAGNOSIS — S90421A Blister (nonthermal), right great toe, initial encounter: Secondary | ICD-10-CM | POA: Diagnosis not present

## 2016-08-13 DIAGNOSIS — Z6831 Body mass index (BMI) 31.0-31.9, adult: Secondary | ICD-10-CM | POA: Diagnosis not present

## 2016-08-13 DIAGNOSIS — X58XXXA Exposure to other specified factors, initial encounter: Secondary | ICD-10-CM | POA: Insufficient documentation

## 2016-08-13 LAB — POCT GLYCOSYLATED HEMOGLOBIN (HGB A1C): Hemoglobin A1C: 6.5

## 2016-08-13 MED ORDER — ATORVASTATIN CALCIUM 40 MG PO TABS: 40.0000 mg | ORAL_TABLET | Freq: Every evening | ORAL | 3 refills | Status: DC

## 2016-08-13 MED ORDER — SULFAMETHOXAZOLE-TRIMETHOPRIM 800-160 MG PO TABS: 1.0000 | ORAL_TABLET | Freq: Two times a day (BID) | ORAL | 0 refills | Status: DC

## 2016-08-13 NOTE — Progress Notes (Signed)
Subjective:  Patient ID: Brooke Stafford, female    DOB: 11/09/1955  Age: 61 y.o. MRN: [id number]  CC: Follow-up (2 mth ) and Ingrown Toenail (Right Big toe x2-3 wks )   HPI Brooke Stafford is a 61 year old female with a history of type 2 diabetes mellitus (A1c 6.5), hypertension, CHF (2-D echo 35 to 40% from 2-D echo of 04/2016), CAD status post PCI end-stage renal disease (currently on hemodialysis on Mondays, Wednesdays and Fridays), depression here for follow-up visit.  She denies hypoglycemia and has been compliant with her medications; she is closely followed by endocrine. Last visit to cardiology was last week, she was euvolemic- plans to wean her off nitrates  She complains of right ingrown toenail for the last 3 weeks for which she was placed on Bactrim by her nephrologist however she noticed a blood blister this morning and does not recall a history of trauma. Toe is painless.  Remains on Celexa for depression and reports that symptoms have improved compared to previously but she is not at her best.  Past Medical History:  Diagnosis Date  . Anemia   . Anginal pain ([place])   . Anxiety   . Arthritis    "stiff fingers and knees" (08/04/2013), (12/12/2014)  . Asthma   . CAD (coronary artery disease) 2002; 2015   CABG x 6 2002, cath 2011- med Rx stent DES VG-Diag  . CAD (coronary artery disease) of artery bypass graft; DES to VG-Diag 09/28/13 11/09/2013  . Cataract   . CHF (congestive heart failure) ([place])    "in 2002" (11/26/2012)  . Chronic bronchitis ([place])    "q year; in the winter"   . CKD (chronic kidney disease)    stage 4, followed by [facility]  . Coronary artery disease 2002   CABG x 6. Cath 5/11- med Rx  . Diabetic neuropathy ([place])   . GERD (gastroesophageal reflux disease)   . Gout    "right big toe"  . Headache    "~ q week" (08/04/2013); "~ twice/month" (12/12/2014)  . History of blood transfusion 2002   "when I had OHS"  . Hyperlipidemia   . Hypertension    . Hypothyroid    treated  . Migraines    "couple times/year" (08/04/2013), (12/12/2014)  . Myocardial infarction ([place]) 2000; 2002; 2011  . Obesity (BMI 35.0-39.9 without comorbidity)   . Peripheral vascular disease ([place]) 12/12   LSFA PTA  . Pneumonia    "3 times I think" (12/12/2014)  . Stroke [facility])    " mini stroke"  . Type II diabetes mellitus ([place])     Past Surgical History:  Procedure Laterality Date  . ABDOMINAL AORTAGRAM N/A 04/05/2011   Procedure: ABDOMINAL AORTAGRAM;  Surgeon: Lorretta Harp, MD;  Location: [facility] CATH LAB;  Service: Cardiovascular;  Laterality: N/A;  . APPENDECTOMY  1980  . AV FISTULA PLACEMENT Left 05/10/2016   Procedure: LEFT ARM ARTERIOVENOUS (AV) FISTULA CREATION;  Surgeon: Rosetta Posner, MD;  Location: [place];  Service: Vascular;  Laterality: Left;  . BASCILIC VEIN TRANSPOSITION Left 06/26/2016   Procedure: SECOND STAGE BASILIC VEIN TRANSPOSITION;  Surgeon: Rosetta Posner, MD;  Location: [place];  Service: Vascular;  Laterality: Left;  . BREAST CYST EXCISION Right 1970's  . CARDIAC CATHETERIZATION  2002  . CARDIAC CATHETERIZATION N/A 12/12/2014   Procedure: Left Heart Cath and Cors/Grafts Angiography;  Surgeon: Peter M Martinique, MD;  Location: [place] CV LAB;  Service: Cardiovascular;  Laterality: N/A;  .  CARDIAC CATHETERIZATION N/A 12/12/2014   Procedure: Coronary Stent Intervention;  Surgeon: Peter M Martinique, MD;  Location: [place] CV LAB;  Service: Cardiovascular;  Laterality: N/A;  . CATARACT EXTRACTION, BILATERAL    . CESAREAN SECTION  1978; 1980  . CHOLECYSTECTOMY  1982  . CORONARY ANGIOPLASTY WITH STENT PLACEMENT  2004; 2012   "I have 2 stents" (08/04/2013)  . CORONARY ANGIOPLASTY WITH STENT PLACEMENT  09/28/13   PTCA/ DES Xience stent to VG-Diag   . CORONARY ARTERY BYPASS GRAFT  11/20/2000   x6 LIMA to distal LAD, svg to first diag, svg to ramus intermediate branch and swquential SVG to cir marginal branch, SVG to posterior descending coronary and  sequential SVG to first right posterolateral branch  . eye injections    . INSERTION OF DIALYSIS CATHETER Right 05/10/2016   Procedure: INSERTION OF DIALYSIS CATHETER - Right Internal Jugular Placement;  Surgeon: Rosetta Posner, MD;  Location: [place];  Service: Vascular;  Laterality: Right;  . LEFT HEART CATHETERIZATION WITH CORONARY/GRAFT ANGIOGRAM N/A 09/28/2013   Procedure: LEFT HEART CATHETERIZATION WITH Beatrix Fetters;  Surgeon: Peter M Martinique, MD;  Location: [facility] CATH LAB;  Service: Cardiovascular;  Laterality: N/A;  . LOWER EXTREMITY ANGIOGRAM  12/01/2012   Procedure: LOWER EXTREMITY ANGIOGRAM;  Surgeon: Lorretta Harp, MD;  Location: [facility] CATH LAB;  Service: Cardiovascular;;  . NM MYOCAR PERF WALL MOTION  08/27/2004   negative  . PERCUTANEOUS STENT INTERVENTION Left 12/01/2012   Procedure: PERCUTANEOUS STENT INTERVENTION;  Surgeon: Lorretta Harp, MD;  Location: [facility] CATH LAB;  Service: Cardiovascular;  Laterality: Left;  Left SFA  . PERIPHERAL ARTERIAL STENT GRAFT Left    SFA/notes 04/07/2011 (11/30/2012)  . RENAL ANGIOGRAM N/A 04/05/2011   Procedure: RENAL ANGIOGRAM;  Surgeon: Lorretta Harp, MD;  Location: [facility] CATH LAB;  Service: Cardiovascular;  Laterality: N/A;  . TUBAL LIGATION  1980    Allergies  Allergen Reactions  . Digoxin And Related Diarrhea and Other (See Comments)    TOXIC DRUG LEVELS Patient stated she almost died. Had flu like symptoms as well as diarrhea.  . Hydralazine Shortness Of Breath  . Penicillins Cross Reactors Hives and Other (See Comments)    HIGH FEVER  Has patient had a PCN reaction causing immediate rash, facial/tongue/throat swelling, SOB or lightheadedness with hypotension: No Has patient had a PCN reaction causing SEVERE RASH INVOLVING MUCUS MEMBRANES or SKIN NECROSIS  #  #  #  YES  #  #  #  Has patient had a PCN reaction that required hospitalization: Unk Has patient had a PCN reaction occurring within the last 10 years: Unk If all of the  above answers are "NO", then may proceed with Cephalosporin use.   [name] Lisinopril Other (See Comments)    Felt like she had the flu. Was very sick!!!  . Adhesive [Tape] Rash    bruising     Outpatient Medications Prior to Visit  Medication Sig Dispense Refill  . acetaminophen-codeine (TYLENOL #3) 300-30 MG tablet Take 1 tablet by mouth 2 (two) times daily as needed for moderate pain.    [name] albuterol (PROVENTIL HFA;VENTOLIN HFA) 108 (90 Base) MCG/ACT inhaler Inhale 2 puffs into the lungs every 6 (six) hours as needed for wheezing or shortness of breath. 3 Inhaler 3  . albuterol (PROVENTIL) (2.5 MG/3ML) 0.083% nebulizer solution Take 3 mLs (2.5 mg total) by nebulization every 6 (six) hours as needed for wheezing or shortness of breath. 150 mL 3  . allopurinol (  ZYLOPRIM) 300 MG tablet Take 1 tablet (300 mg total) by mouth daily. 90 tablet 3  . aspirin 81 MG chewable tablet Chew 1 tablet (81 mg total) by mouth daily. 30 tablet 10  . citalopram (CELEXA) 20 MG tablet Take 1 tablet (20 mg total) by mouth daily. 30 tablet 3  . clopidogrel (PLAVIX) 75 MG tablet Take 1 tablet (75 mg total) by mouth daily. 90 tablet 3  . ezetimibe (ZETIA) 10 MG tablet Take 1 tablet (10 mg total) by mouth daily. 90 tablet 3  . ferric gluconate 125 mg in sodium chloride 0.9 % 100 mL Inject 125 mg into the vein Every Tuesday,Thursday,and Saturday with dialysis.    . fluticasone (FLONASE) 50 MCG/ACT nasal spray Place 2 sprays into both nostrils daily as needed for allergies or rhinitis. 16 g 3  . gabapentin (NEURONTIN) 400 MG capsule Take 400 mg by mouth daily.     . hydrOXYzine (ATARAX/VISTARIL) 25 MG tablet Take 1 tablet (25 mg total) by mouth daily as needed for anxiety or itching.    . insulin aspart (NOVOLOG) 100 UNIT/ML injection Inject 15-20 Units into the skin 3 (three) times daily before meals. Inject as per sliding scale: 0-150=0 units, 151+=5 units     . Insulin Detemir (LEVEMIR FLEXPEN) 100 UNIT/ML Pen Inject 25  units into the skin at bedtime. 10 pen 1  . Insulin Glargine (LANTUS SOLOSTAR) 100 UNIT/ML Solostar Pen Inject 25 units into the skin at bedtime. 10 pen 3  . levothyroxine (SYNTHROID, LEVOTHROID) 25 MCG tablet take 39mcg by mouth every morning BEFORE BREAKFAST 30 tablet 2  . loratadine (CLARITIN) 10 MG tablet Take 10 mg by mouth daily.    . metoprolol tartrate (LOPRESSOR) 25 MG tablet Take 0.5 tablets (12.5 mg total) by mouth 2 (two) times daily. 90 tablet 3  . Naphazoline HCl (CLEAR EYES OP) Place 1 drop into both eyes as needed (for dry eyes).    . nitroGLYCERIN (NITROSTAT) 0.4 MG SL tablet Place 1 tablet (0.4 mg total) under the tongue every 5 (five) minutes as needed. (Patient taking differently: Place 0.4 mg under the tongue every 5 (five) minutes as needed for chest pain. ) 30 tablet 2  . prednisoLONE acetate (PRED FORTE) 1 % ophthalmic suspension Place 1 drop into both eyes 2 (two) times daily.   1  . ranitidine (ZANTAC 75) 75 MG tablet Take 1 tablet (75 mg total) by mouth daily as needed for heartburn.    . senna-docusate (SENOKOT-S) 8.6-50 MG tablet Take 1 tablet by mouth at bedtime as needed for mild constipation. 30 tablet 0  . sevelamer (RENAGEL) 800 MG tablet Take 2,400 mg by mouth 3 (three) times daily with meals.    . triamcinolone ointment (KENALOG) 0.5 % Apply 1 application topically daily.    [name] atorvastatin (LIPITOR) 40 MG tablet Take 1 tablet (40 mg total) by mouth daily. (Patient taking differently: Take 40 mg by mouth every evening. ) 30 tablet 3   No facility-administered medications prior to visit.     ROS Review of Systems  Constitutional: Negative for activity change, appetite change and fatigue.  HENT: Negative for congestion, sinus pressure and sore throat.   Eyes: Negative for visual disturbance.  Respiratory: Negative for cough, chest tightness, shortness of breath and wheezing.   Cardiovascular: Negative for chest pain and palpitations.  Gastrointestinal:  Negative for abdominal distention, abdominal pain and constipation.  Endocrine: Negative for polydipsia.  Genitourinary: Negative for dysuria and frequency.  Musculoskeletal: Negative  for arthralgias and back pain.  Skin:       See hpi  Neurological: Negative for tremors, light-headedness and numbness.  Hematological: Does not bruise/bleed easily.  Psychiatric/Behavioral: Negative for agitation and behavioral problems.    Objective:  BP 133/67 (BP Location: Right Arm, Patient Position: Sitting, Cuff Size: Large)   Pulse 73   Temp 98.4 F (36.9 C) (Oral)   Resp 16   Ht 5' 10.5" (1.791 m) Comment: w/shoes  Wt 225 lb 12.8 oz (102.4 kg) Comment: w/ shoes  LMP 10/13/2014 (Exact Date)   SpO2 96%   BMI 31.94 kg/m   BP/Weight 08/13/2016 08/08/2016 06/03/7586  Systolic BP 325 498 264  Diastolic BP 67 56 64  Wt. (Lbs) 225.8 216 -  BMI 31.94 30.99 -      Physical Exam  Constitutional: She is oriented to person, place, and time. She appears well-developed and well-nourished.  Cardiovascular: Normal rate, normal heart sounds and intact distal pulses.   No murmur heard. Pulmonary/Chest: Effort normal and breath sounds normal. She has no wheezes. She has no rales. She exhibits no tenderness.  Right upper chest wall permacath  Abdominal: Soft. Bowel sounds are normal. She exhibits no distension and no mass. There is no tenderness.  Musculoskeletal: Normal range of motion.  Left AV fistula not fully mature with palpable thrill  Neurological: She is alert and oriented to person, place, and time.  Skin:  Blood blister on medial aspect of right big toe. Ingrown toenail on right big toe     Lab Results  Component Value Date   HGBA1C 6.5 08/13/2016    Assessment & Plan:   1. Hypothyroidism, acquired Stable We'll need to obtain most recent TSH from nephrology  2. ESRD (end stage renal disease) ([place]) Hemodialysis as per protocol  3. Chronic bronchitis, unspecified chronic  bronchitis type ([place]) No acute flares  4. Reactive depression Improved on Celexa Patient endorses that this is also dialysis related  5. Blister of toe of right foot with infection, initial encounter Dressing In the Clinic We'll repeat treatment with Bactrim again - sulfamethoxazole-trimethoprim (BACTRIM DS) 800-160 MG tablet; Take 1 tablet by mouth 2 (two) times daily.  Dispense: 20 tablet; Refill: 0  6. Diabetes mellitus due to underlying condition with diabetic peripheral angiopathy without gangrene, with long-term current use of insulin (HCC) Controlled with A1c of 6.5 Keep appointment with endocrine - POCT glycosylated hemoglobin (Hb A1C)   Meds ordered this encounter  Medications  . atorvastatin (LIPITOR) 40 MG tablet    Sig: Take 1 tablet (40 mg total) by mouth every evening.    Dispense:  30 tablet    Refill:  3  . sulfamethoxazole-trimethoprim (BACTRIM DS) 800-160 MG tablet    Sig: Take 1 tablet by mouth 2 (two) times daily.    Dispense:  20 tablet    Refill:  0    Follow-up: Return in about 2 weeks (around 08/27/2016) for Follow-up: Right toe infection.   Arnoldo Morale MD

## 2016-08-14 DIAGNOSIS — D509 Iron deficiency anemia, unspecified: Secondary | ICD-10-CM | POA: Diagnosis not present

## 2016-08-14 DIAGNOSIS — E1129 Type 2 diabetes mellitus with other diabetic kidney complication: Secondary | ICD-10-CM | POA: Diagnosis not present

## 2016-08-14 DIAGNOSIS — D631 Anemia in chronic kidney disease: Secondary | ICD-10-CM | POA: Diagnosis not present

## 2016-08-14 DIAGNOSIS — N2581 Secondary hyperparathyroidism of renal origin: Secondary | ICD-10-CM | POA: Diagnosis not present

## 2016-08-14 DIAGNOSIS — N186 End stage renal disease: Secondary | ICD-10-CM | POA: Diagnosis not present

## 2016-08-14 DIAGNOSIS — Z23 Encounter for immunization: Secondary | ICD-10-CM | POA: Diagnosis not present

## 2016-08-15 ENCOUNTER — Ambulatory Visit (INDEPENDENT_AMBULATORY_CARE_PROVIDER_SITE_OTHER): Payer: Medicare Other | Admitting: Podiatry

## 2016-08-15 ENCOUNTER — Encounter: Payer: Self-pay | Admitting: Podiatry

## 2016-08-15 VITALS — BP 140/74 | HR 49

## 2016-08-15 DIAGNOSIS — E1169 Type 2 diabetes mellitus with other specified complication: Secondary | ICD-10-CM | POA: Diagnosis not present

## 2016-08-15 DIAGNOSIS — M869 Osteomyelitis, unspecified: Secondary | ICD-10-CM | POA: Diagnosis not present

## 2016-08-15 DIAGNOSIS — E1142 Type 2 diabetes mellitus with diabetic polyneuropathy: Secondary | ICD-10-CM

## 2016-08-15 DIAGNOSIS — L97509 Non-pressure chronic ulcer of other part of unspecified foot with unspecified severity: Secondary | ICD-10-CM | POA: Diagnosis not present

## 2016-08-15 DIAGNOSIS — I2510 Atherosclerotic heart disease of native coronary artery without angina pectoris: Secondary | ICD-10-CM | POA: Diagnosis not present

## 2016-08-15 DIAGNOSIS — E11621 Type 2 diabetes mellitus with foot ulcer: Secondary | ICD-10-CM | POA: Diagnosis not present

## 2016-08-15 DIAGNOSIS — L97519 Non-pressure chronic ulcer of other part of right foot with unspecified severity: Secondary | ICD-10-CM | POA: Diagnosis not present

## 2016-08-15 DIAGNOSIS — Z9861 Coronary angioplasty status: Secondary | ICD-10-CM | POA: Diagnosis not present

## 2016-08-15 NOTE — Progress Notes (Signed)
   Subjective:    Patient ID: Brooke Stafford, female    DOB: Jan 21, 1956, 61 y.o.   MRN: [id number]  HPI this patient presents the office with chief complaint of a healing area on the tip of her right .  She says she caused the injury trimming an ingrowing toenail on the inside border of the right big toe. The injury occurred on April 8.   She says that she was evaluated following the injury and was treated with Septra which she finished taking on April 18.  She was seen Tuesday  by her medical doctor and he noted that she was still not healing at the tip of the great toe, right foot.  He requested an evaluation since she has not completely healed. since she sustained self inflicted  injury to the right great toe.  She presents for an evaluation and treatment of this right big toe.  She is a diabetic on dialysis.  She says she has no pus or drainage noted.    Review of Systems  All other systems reviewed and are negative.      Objective:   Physical Exam GENERAL APPEARANCE: Alert, conversant. Appropriately groomed. No acute distress.  VASCULAR: Pedal pulses are  palpable at  [facility] and PT bilateral.  Capillary refill time is immediate to all digits,  Normal temperature gradient.    NEUROLOGIC: sensation is diminished  to 5.07 monofilament at 5/5 sites bilateral.  Light touch is intact bilateral, Muscle strength normal.  MUSCULOSKELETAL: acceptable muscle strength, tone and stability bilateral.  Intrinsic muscluature intact bilateral.  Rectus appearance of foot and digits noted bilateral.   DERMATOLOGIC: skin color, texture, and turgor are within normal limits except at the site of injury.  There is a black skin necrosis noted at the distal medial  aspect of the right hallux.  This black skin necrosis is covering a diabetic ulcer of approximately 10 mm x 10 mm. No evidence of any redness, swelling noted at the tip right hallux.    NAILS  thick disfigured discolored, incurvated hallux toenails  bilaterally.           Assessment & Plan:  Diabetic Ulcer right hallux.    IE  Debride ulcer and ingrown tonail medial border right hallux.  Skin necrosis distal aspect right hallux.  Neosporin/DSD.Patient was instructed to soak her foot at home and bandage her right great toe. She is to return the office in 2 weeks at which time I will evaluate the healing on the right great toe   Gardiner Barefoot DPM

## 2016-08-16 DIAGNOSIS — D509 Iron deficiency anemia, unspecified: Secondary | ICD-10-CM | POA: Diagnosis not present

## 2016-08-16 DIAGNOSIS — E1129 Type 2 diabetes mellitus with other diabetic kidney complication: Secondary | ICD-10-CM | POA: Diagnosis not present

## 2016-08-16 DIAGNOSIS — N2581 Secondary hyperparathyroidism of renal origin: Secondary | ICD-10-CM | POA: Diagnosis not present

## 2016-08-16 DIAGNOSIS — N186 End stage renal disease: Secondary | ICD-10-CM | POA: Diagnosis not present

## 2016-08-16 DIAGNOSIS — Z23 Encounter for immunization: Secondary | ICD-10-CM | POA: Diagnosis not present

## 2016-08-16 DIAGNOSIS — D631 Anemia in chronic kidney disease: Secondary | ICD-10-CM | POA: Diagnosis not present

## 2016-08-19 DIAGNOSIS — D631 Anemia in chronic kidney disease: Secondary | ICD-10-CM | POA: Diagnosis not present

## 2016-08-19 DIAGNOSIS — N186 End stage renal disease: Secondary | ICD-10-CM | POA: Diagnosis not present

## 2016-08-19 DIAGNOSIS — Z23 Encounter for immunization: Secondary | ICD-10-CM | POA: Diagnosis not present

## 2016-08-19 DIAGNOSIS — D509 Iron deficiency anemia, unspecified: Secondary | ICD-10-CM | POA: Diagnosis not present

## 2016-08-19 DIAGNOSIS — E1129 Type 2 diabetes mellitus with other diabetic kidney complication: Secondary | ICD-10-CM | POA: Diagnosis not present

## 2016-08-19 DIAGNOSIS — N2581 Secondary hyperparathyroidism of renal origin: Secondary | ICD-10-CM | POA: Diagnosis not present

## 2016-08-20 ENCOUNTER — Ambulatory Visit (INDEPENDENT_AMBULATORY_CARE_PROVIDER_SITE_OTHER): Payer: Medicare Other | Admitting: Internal Medicine

## 2016-08-20 ENCOUNTER — Encounter: Payer: Self-pay | Admitting: Internal Medicine

## 2016-08-20 VITALS — BP 130/72 | HR 79 | Wt 218.0 lb

## 2016-08-20 DIAGNOSIS — Z9861 Coronary angioplasty status: Secondary | ICD-10-CM

## 2016-08-20 DIAGNOSIS — Z794 Long term (current) use of insulin: Secondary | ICD-10-CM | POA: Diagnosis not present

## 2016-08-20 DIAGNOSIS — E0851 Diabetes mellitus due to underlying condition with diabetic peripheral angiopathy without gangrene: Secondary | ICD-10-CM

## 2016-08-20 DIAGNOSIS — I2510 Atherosclerotic heart disease of native coronary artery without angina pectoris: Secondary | ICD-10-CM | POA: Diagnosis not present

## 2016-08-20 MED ORDER — INSULIN ASPART 100 UNIT/ML ~~LOC~~ SOLN: 25.0000 [IU] | Freq: Three times a day (TID) | SUBCUTANEOUS | 5 refills | Status: DC

## 2016-08-20 MED ORDER — INSULIN GLARGINE 100 UNIT/ML SOLOSTAR PEN: PEN_INJECTOR | SUBCUTANEOUS | 5 refills | Status: DC
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
## 2016-08-20 NOTE — Patient Instructions (Addendum)
Please increase: - Lantus to 28 units at bedtime (can increase further, to 30 if sugars are still high in am)  Please continue: - Novolog 25-30 units before meals If sugars before a meal are 80-150: take the whole amount of mealtime insulin with that meal, but not the Sliding scale If sugars are 60-80, take only half of the mealtime insulin and no sliding scale. If sugars <60, do not take any insulin with that meal. - NovoLog SSI: - 150- 165: + 1 unit  - 166- 180: + 2 units  - 181- 195: + 3 units  - 196- 210: + 4 units  - >210: + 5 units  Please return in 3 months with your sugar log.

## 2016-08-21 DIAGNOSIS — N2581 Secondary hyperparathyroidism of renal origin: Secondary | ICD-10-CM | POA: Diagnosis not present

## 2016-08-21 DIAGNOSIS — D631 Anemia in chronic kidney disease: Secondary | ICD-10-CM | POA: Diagnosis not present

## 2016-08-21 DIAGNOSIS — N186 End stage renal disease: Secondary | ICD-10-CM | POA: Diagnosis not present

## 2016-08-21 DIAGNOSIS — Z23 Encounter for immunization: Secondary | ICD-10-CM | POA: Diagnosis not present

## 2016-08-21 DIAGNOSIS — E1129 Type 2 diabetes mellitus with other diabetic kidney complication: Secondary | ICD-10-CM | POA: Diagnosis not present

## 2016-08-21 DIAGNOSIS — D509 Iron deficiency anemia, unspecified: Secondary | ICD-10-CM | POA: Diagnosis not present

## 2016-08-23 ENCOUNTER — Other Ambulatory Visit: Payer: Self-pay | Admitting: Licensed Clinical Social Worker

## 2016-08-23 DIAGNOSIS — Z23 Encounter for immunization: Secondary | ICD-10-CM | POA: Diagnosis not present

## 2016-08-23 DIAGNOSIS — E1129 Type 2 diabetes mellitus with other diabetic kidney complication: Secondary | ICD-10-CM | POA: Diagnosis not present

## 2016-08-23 DIAGNOSIS — N186 End stage renal disease: Secondary | ICD-10-CM | POA: Diagnosis not present

## 2016-08-23 DIAGNOSIS — D631 Anemia in chronic kidney disease: Secondary | ICD-10-CM | POA: Diagnosis not present

## 2016-08-23 DIAGNOSIS — N2581 Secondary hyperparathyroidism of renal origin: Secondary | ICD-10-CM | POA: Diagnosis not present

## 2016-08-23 DIAGNOSIS — D509 Iron deficiency anemia, unspecified: Secondary | ICD-10-CM | POA: Diagnosis not present

## 2016-08-23 NOTE — Patient Outreach (Signed)
[facility]) Care Management  08/23/2016  Brooke Stafford [birth year] [id number]  Assessment- CSW completed outreach call to patient and she answered. She shares that she is doing "very good" and has "no complaints." She reports that she has kept her public housing information somewhere safe for her to refer back to whenever needed. Patient denies having any further social work needs at this time and is agreeable to social work discharge. Patient shared that she will contact this CSW if she has any needs in the future. Patient expressed appreciation for assistance with social work needs.  Plan-CSW will update [facility] RNCM and PCP of social work discharge.  [name], BSW, MSW, [email or web address] Phone: [phone] Fax: [phone]

## 2016-08-26 DIAGNOSIS — D509 Iron deficiency anemia, unspecified: Secondary | ICD-10-CM | POA: Diagnosis not present

## 2016-08-26 DIAGNOSIS — Z23 Encounter for immunization: Secondary | ICD-10-CM | POA: Diagnosis not present

## 2016-08-26 DIAGNOSIS — N186 End stage renal disease: Secondary | ICD-10-CM | POA: Diagnosis not present

## 2016-08-26 DIAGNOSIS — E1129 Type 2 diabetes mellitus with other diabetic kidney complication: Secondary | ICD-10-CM | POA: Diagnosis not present

## 2016-08-26 DIAGNOSIS — N2581 Secondary hyperparathyroidism of renal origin: Secondary | ICD-10-CM | POA: Diagnosis not present

## 2016-08-26 DIAGNOSIS — D631 Anemia in chronic kidney disease: Secondary | ICD-10-CM | POA: Diagnosis not present

## 2016-08-27 ENCOUNTER — Encounter: Payer: Self-pay | Admitting: Family Medicine

## 2016-08-27 ENCOUNTER — Ambulatory Visit: Payer: Medicare Other | Attending: Family Medicine | Admitting: Family Medicine

## 2016-08-27 VITALS — BP 135/69 | HR 79 | Temp 99.0°F | Resp 18 | Ht 73.0 in | Wt 219.0 lb

## 2016-08-27 DIAGNOSIS — D631 Anemia in chronic kidney disease: Secondary | ICD-10-CM | POA: Insufficient documentation

## 2016-08-27 DIAGNOSIS — H269 Unspecified cataract: Secondary | ICD-10-CM | POA: Insufficient documentation

## 2016-08-27 DIAGNOSIS — L97512 Non-pressure chronic ulcer of other part of right foot with fat layer exposed: Secondary | ICD-10-CM | POA: Diagnosis not present

## 2016-08-27 DIAGNOSIS — Z7902 Long term (current) use of antithrombotics/antiplatelets: Secondary | ICD-10-CM | POA: Insufficient documentation

## 2016-08-27 DIAGNOSIS — M19042 Primary osteoarthritis, left hand: Secondary | ICD-10-CM | POA: Insufficient documentation

## 2016-08-27 DIAGNOSIS — E785 Hyperlipidemia, unspecified: Secondary | ICD-10-CM | POA: Diagnosis not present

## 2016-08-27 DIAGNOSIS — J45909 Unspecified asthma, uncomplicated: Secondary | ICD-10-CM | POA: Diagnosis not present

## 2016-08-27 DIAGNOSIS — Z9851 Tubal ligation status: Secondary | ICD-10-CM | POA: Insufficient documentation

## 2016-08-27 DIAGNOSIS — M79674 Pain in right toe(s): Secondary | ICD-10-CM | POA: Diagnosis present

## 2016-08-27 DIAGNOSIS — E1140 Type 2 diabetes mellitus with diabetic neuropathy, unspecified: Secondary | ICD-10-CM | POA: Diagnosis not present

## 2016-08-27 DIAGNOSIS — E1122 Type 2 diabetes mellitus with diabetic chronic kidney disease: Secondary | ICD-10-CM | POA: Diagnosis not present

## 2016-08-27 DIAGNOSIS — Z9049 Acquired absence of other specified parts of digestive tract: Secondary | ICD-10-CM | POA: Insufficient documentation

## 2016-08-27 DIAGNOSIS — Z794 Long term (current) use of insulin: Secondary | ICD-10-CM | POA: Diagnosis not present

## 2016-08-27 DIAGNOSIS — I252 Old myocardial infarction: Secondary | ICD-10-CM | POA: Insufficient documentation

## 2016-08-27 DIAGNOSIS — I132 Hypertensive heart and chronic kidney disease with heart failure and with stage 5 chronic kidney disease, or end stage renal disease: Secondary | ICD-10-CM | POA: Diagnosis not present

## 2016-08-27 DIAGNOSIS — L600 Ingrowing nail: Secondary | ICD-10-CM | POA: Insufficient documentation

## 2016-08-27 DIAGNOSIS — Z992 Dependence on renal dialysis: Secondary | ICD-10-CM | POA: Diagnosis not present

## 2016-08-27 DIAGNOSIS — Z7982 Long term (current) use of aspirin: Secondary | ICD-10-CM | POA: Insufficient documentation

## 2016-08-27 DIAGNOSIS — M19041 Primary osteoarthritis, right hand: Secondary | ICD-10-CM | POA: Insufficient documentation

## 2016-08-27 DIAGNOSIS — M170 Bilateral primary osteoarthritis of knee: Secondary | ICD-10-CM | POA: Insufficient documentation

## 2016-08-27 DIAGNOSIS — E11621 Type 2 diabetes mellitus with foot ulcer: Secondary | ICD-10-CM | POA: Diagnosis not present

## 2016-08-27 DIAGNOSIS — Z9109 Other allergy status, other than to drugs and biological substances: Secondary | ICD-10-CM | POA: Insufficient documentation

## 2016-08-27 DIAGNOSIS — Z9841 Cataract extraction status, right eye: Secondary | ICD-10-CM | POA: Insufficient documentation

## 2016-08-27 DIAGNOSIS — Z880 Allergy status to penicillin: Secondary | ICD-10-CM | POA: Insufficient documentation

## 2016-08-27 DIAGNOSIS — I509 Heart failure, unspecified: Secondary | ICD-10-CM | POA: Diagnosis not present

## 2016-08-27 DIAGNOSIS — E1151 Type 2 diabetes mellitus with diabetic peripheral angiopathy without gangrene: Secondary | ICD-10-CM | POA: Insufficient documentation

## 2016-08-27 DIAGNOSIS — I25810 Atherosclerosis of coronary artery bypass graft(s) without angina pectoris: Secondary | ICD-10-CM | POA: Insufficient documentation

## 2016-08-27 DIAGNOSIS — Z888 Allergy status to other drugs, medicaments and biological substances status: Secondary | ICD-10-CM | POA: Insufficient documentation

## 2016-08-27 DIAGNOSIS — E039 Hypothyroidism, unspecified: Secondary | ICD-10-CM | POA: Diagnosis not present

## 2016-08-27 DIAGNOSIS — K219 Gastro-esophageal reflux disease without esophagitis: Secondary | ICD-10-CM | POA: Diagnosis not present

## 2016-08-27 DIAGNOSIS — Z9861 Coronary angioplasty status: Secondary | ICD-10-CM

## 2016-08-27 DIAGNOSIS — Z9889 Other specified postprocedural states: Secondary | ICD-10-CM | POA: Insufficient documentation

## 2016-08-27 DIAGNOSIS — G43909 Migraine, unspecified, not intractable, without status migrainosus: Secondary | ICD-10-CM | POA: Insufficient documentation

## 2016-08-27 DIAGNOSIS — M109 Gout, unspecified: Secondary | ICD-10-CM | POA: Diagnosis not present

## 2016-08-27 DIAGNOSIS — Z955 Presence of coronary angioplasty implant and graft: Secondary | ICD-10-CM | POA: Insufficient documentation

## 2016-08-27 DIAGNOSIS — N186 End stage renal disease: Secondary | ICD-10-CM | POA: Diagnosis not present

## 2016-08-27 DIAGNOSIS — Z8673 Personal history of transient ischemic attack (TIA), and cerebral infarction without residual deficits: Secondary | ICD-10-CM | POA: Insufficient documentation

## 2016-08-27 DIAGNOSIS — Z9842 Cataract extraction status, left eye: Secondary | ICD-10-CM | POA: Insufficient documentation

## 2016-08-27 DIAGNOSIS — I2510 Atherosclerotic heart disease of native coronary artery without angina pectoris: Secondary | ICD-10-CM

## 2016-08-27 DIAGNOSIS — Z951 Presence of aortocoronary bypass graft: Secondary | ICD-10-CM | POA: Insufficient documentation

## 2016-08-27 LAB — GLUCOSE, POCT (MANUAL RESULT ENTRY): POC GLUCOSE: 168 mg/dL — AB (ref 70–99)

## 2016-08-27 MED ORDER — TRAMADOL HCL 50 MG PO TABS: 50.0000 mg | ORAL_TABLET | Freq: Two times a day (BID) | ORAL | 0 refills | Status: DC | PRN

## 2016-08-27 NOTE — Progress Notes (Signed)
Subjective:  Patient ID: Brooke Stafford, female    DOB: [birth year]  Age: 61 y.o. MRN: [id number]  CC: Toe Pain   HPI Brooke Stafford presents for Follow-up of right toe pain for which she had received another course of Bactrim at her last office visit after a course of Bactrim given by her nephrologist.  Medical history significant foris a 61 year old female with a history of type 2 diabetes mellitus (A1c 6.5), hypertension, CHF (2-D echo 35 to 40% from 2-D echo of 04/2016), CAD status post PCI end-stage renal disease (currently on hemodialysis on Mondays, Wednesdays and Fridays).  She had a visit a podiatrist recently and had bilateral ingrown toenail removed however she complains of pain and throbbing in her left big toe ever since she had that procedure. The blood blister which she had on her right big toe has popped and she has been dressing it with gauze. She has an upcoming appointment with podiatry next week  Past Medical History:  Diagnosis Date  . Anemia   . Anginal pain ([place])   . Anxiety   . Arthritis    "stiff fingers and knees" (08/04/2013), (12/12/2014)  . Asthma   . CAD (coronary artery disease) 2002; 2015   CABG x 6 2002, cath 2011- med Rx stent DES VG-Diag  . CAD (coronary artery disease) of artery bypass graft; DES to VG-Diag 09/28/13 11/09/2013  . Cataract   . CHF (congestive heart failure) ([place])    "in 2002" (11/26/2012)  . Chronic bronchitis ([place])    "q year; in the winter"   . CKD (chronic kidney disease)    stage 4, followed by [facility]  . Coronary artery disease 2002   CABG x 6. Cath 5/11- med Rx  . Diabetic neuropathy ([place])   . GERD (gastroesophageal reflux disease)   . Gout    "right big toe"  . Headache    "~ q week" (08/04/2013); "~ twice/month" (12/12/2014)  . History of blood transfusion 2002   "when I had OHS"  . Hyperlipidemia   . Hypertension   . Hypothyroid    treated  . Migraines    "couple times/year" (08/04/2013), (12/12/2014)  .  Myocardial infarction ([place]) 2000; 2002; 2011  . Obesity (BMI 35.0-39.9 without comorbidity)   . Peripheral vascular disease ([place]) 12/12   LSFA PTA  . Pneumonia    "3 times I think" (12/12/2014)  . Stroke [facility])    " mini stroke"  . Type II diabetes mellitus ([place])     Past Surgical History:  Procedure Laterality Date  . ABDOMINAL AORTAGRAM N/A 04/05/2011   Procedure: ABDOMINAL AORTAGRAM;  Surgeon: Lorretta Harp, MD;  Location: [facility] CATH LAB;  Service: Cardiovascular;  Laterality: N/A;  . APPENDECTOMY  1980  . AV FISTULA PLACEMENT Left 05/10/2016   Procedure: LEFT ARM ARTERIOVENOUS (AV) FISTULA CREATION;  Surgeon: Rosetta Posner, MD;  Location: [place];  Service: Vascular;  Laterality: Left;  . BASCILIC VEIN TRANSPOSITION Left 06/26/2016   Procedure: SECOND STAGE BASILIC VEIN TRANSPOSITION;  Surgeon: Rosetta Posner, MD;  Location: [place];  Service: Vascular;  Laterality: Left;  . BREAST CYST EXCISION Right 1970's  . CARDIAC CATHETERIZATION  2002  . CARDIAC CATHETERIZATION N/A 12/12/2014   Procedure: Left Heart Cath and Cors/Grafts Angiography;  Surgeon: Peter M Martinique, MD;  Location: [place] CV LAB;  Service: Cardiovascular;  Laterality: N/A;  . CARDIAC CATHETERIZATION N/A 12/12/2014   Procedure: Coronary Stent Intervention;  Surgeon: Peter M Martinique,  MD;  Location: [place] CV LAB;  Service: Cardiovascular;  Laterality: N/A;  . CATARACT EXTRACTION, BILATERAL    . CESAREAN SECTION  1978; 1980  . CHOLECYSTECTOMY  1982  . CORONARY ANGIOPLASTY WITH STENT PLACEMENT  2004; 2012   "I have 2 stents" (08/04/2013)  . CORONARY ANGIOPLASTY WITH STENT PLACEMENT  09/28/13   PTCA/ DES Xience stent to VG-Diag   . CORONARY ARTERY BYPASS GRAFT  11/20/2000   x6 LIMA to distal LAD, svg to first diag, svg to ramus intermediate branch and swquential SVG to cir marginal branch, SVG to posterior descending coronary and sequential SVG to first right posterolateral branch  . eye injections    . INSERTION OF DIALYSIS  CATHETER Right 05/10/2016   Procedure: INSERTION OF DIALYSIS CATHETER - Right Internal Jugular Placement;  Surgeon: Rosetta Posner, MD;  Location: [place];  Service: Vascular;  Laterality: Right;  . LEFT HEART CATHETERIZATION WITH CORONARY/GRAFT ANGIOGRAM N/A 09/28/2013   Procedure: LEFT HEART CATHETERIZATION WITH Beatrix Fetters;  Surgeon: Peter M Martinique, MD;  Location: [facility] CATH LAB;  Service: Cardiovascular;  Laterality: N/A;  . LOWER EXTREMITY ANGIOGRAM  12/01/2012   Procedure: LOWER EXTREMITY ANGIOGRAM;  Surgeon: Lorretta Harp, MD;  Location: [facility] CATH LAB;  Service: Cardiovascular;;  . NM MYOCAR PERF WALL MOTION  08/27/2004   negative  . PERCUTANEOUS STENT INTERVENTION Left 12/01/2012   Procedure: PERCUTANEOUS STENT INTERVENTION;  Surgeon: Lorretta Harp, MD;  Location: [facility] CATH LAB;  Service: Cardiovascular;  Laterality: Left;  Left SFA  . PERIPHERAL ARTERIAL STENT GRAFT Left    SFA/notes 04/07/2011 (11/30/2012)  . RENAL ANGIOGRAM N/A 04/05/2011   Procedure: RENAL ANGIOGRAM;  Surgeon: Lorretta Harp, MD;  Location: [facility] CATH LAB;  Service: Cardiovascular;  Laterality: N/A;  . TUBAL LIGATION  1980    Allergies  Allergen Reactions  . Digoxin And Related Diarrhea and Other (See Comments)    TOXIC DRUG LEVELS Patient stated she almost died. Had flu like symptoms as well as diarrhea.  . Hydralazine Shortness Of Breath  . Penicillins Cross Reactors Hives and Other (See Comments)    HIGH FEVER  Has patient had a PCN reaction causing immediate rash, facial/tongue/throat swelling, SOB or lightheadedness with hypotension: No Has patient had a PCN reaction causing SEVERE RASH INVOLVING MUCUS MEMBRANES or SKIN NECROSIS  #  #  #  YES  #  #  #  Has patient had a PCN reaction that required hospitalization: Unk Has patient had a PCN reaction occurring within the last 10 years: Unk If all of the above answers are "NO", then may proceed with Cephalosporin use.   [name] Lisinopril Other (See Comments)      Felt like she had the flu. Was very sick!!!  . Adhesive [Tape] Rash    bruising     Outpatient Medications Prior to Visit  Medication Sig Dispense Refill  . acetaminophen-codeine (TYLENOL #3) 300-30 MG tablet Take 1 tablet by mouth 2 (two) times daily as needed for moderate pain.    [name] albuterol (PROVENTIL HFA;VENTOLIN HFA) 108 (90 Base) MCG/ACT inhaler Inhale 2 puffs into the lungs every 6 (six) hours as needed for wheezing or shortness of breath. 3 Inhaler 3  . albuterol (PROVENTIL) (2.5 MG/3ML) 0.083% nebulizer solution Take 3 mLs (2.5 mg total) by nebulization every 6 (six) hours as needed for wheezing or shortness of breath. 150 mL 3  . allopurinol (ZYLOPRIM) 300 MG tablet Take 1 tablet (300 mg total) by mouth daily. [place]  tablet 3  . aspirin 81 MG chewable tablet Chew 1 tablet (81 mg total) by mouth daily. 30 tablet 10  . atorvastatin (LIPITOR) 40 MG tablet Take 1 tablet (40 mg total) by mouth every evening. 30 tablet 3  . citalopram (CELEXA) 20 MG tablet Take 1 tablet (20 mg total) by mouth daily. 30 tablet 3  . clopidogrel (PLAVIX) 75 MG tablet Take 1 tablet (75 mg total) by mouth daily. 90 tablet 3  . ezetimibe (ZETIA) 10 MG tablet Take 1 tablet (10 mg total) by mouth daily. 90 tablet 3  . ferric gluconate 125 mg in sodium chloride 0.9 % 100 mL Inject 125 mg into the vein Every Tuesday,Thursday,and Saturday with dialysis.    . fluticasone (FLONASE) 50 MCG/ACT nasal spray Place 2 sprays into both nostrils daily as needed for allergies or rhinitis. 16 g 3  . gabapentin (NEURONTIN) 400 MG capsule Take 400 mg by mouth daily.     . hydrOXYzine (ATARAX/VISTARIL) 25 MG tablet Take 1 tablet (25 mg total) by mouth daily as needed for anxiety or itching.    . insulin aspart (NOVOLOG) 100 UNIT/ML injection Inject 25-30 Units into the skin 3 (three) times daily before meals. Pens 15 mL 5  . Insulin Glargine (LANTUS SOLOSTAR) 100 UNIT/ML Solostar Pen Inject 28 units into the skin at bedtime. 15 pen  5  . isosorbide mononitrate (IMDUR) 60 MG 24 hr tablet Take 60 mg by mouth daily.    [name] levothyroxine (SYNTHROID, LEVOTHROID) 25 MCG tablet take 48mcg by mouth every morning BEFORE BREAKFAST 30 tablet 2  . lidocaine-prilocaine (EMLA) cream Apply 1 application topically as directed.  1  . loratadine (CLARITIN) 10 MG tablet Take 10 mg by mouth daily.    . metoprolol tartrate (LOPRESSOR) 25 MG tablet Take 0.5 tablets (12.5 mg total) by mouth 2 (two) times daily. 90 tablet 3  . Naphazoline HCl (CLEAR EYES OP) Place 1 drop into both eyes as needed (for dry eyes).    . nitroGLYCERIN (NITROSTAT) 0.4 MG SL tablet Place 1 tablet (0.4 mg total) under the tongue every 5 (five) minutes as needed. (Patient taking differently: Place 0.4 mg under the tongue every 5 (five) minutes as needed for chest pain. ) 30 tablet 2  . prednisoLONE acetate (PRED FORTE) 1 % ophthalmic suspension Place 1 drop into both eyes 2 (two) times daily.   1  . ranitidine (ZANTAC 75) 75 MG tablet Take 1 tablet (75 mg total) by mouth daily as needed for heartburn.    [name] RENVELA 800 MG tablet     . senna-docusate (SENOKOT-S) 8.6-50 MG tablet Take 1 tablet by mouth at bedtime as needed for mild constipation. 30 tablet 0  . sevelamer (RENAGEL) 800 MG tablet Take 2,400 mg by mouth 3 (three) times daily with meals.    [name] sulfamethoxazole-trimethoprim (BACTRIM DS) 800-160 MG tablet Take 1 tablet by mouth 2 (two) times daily. 20 tablet 0  . triamcinolone ointment (KENALOG) 0.5 % Apply 1 application topically daily.     No facility-administered medications prior to visit.     ROS Review of Systems Constitutional: Negative for activity change, appetite change and fatigue.  HENT: Negative for congestion, sinus pressure and sore throat.   Eyes: Negative for visual disturbance.  Respiratory: Negative for cough, chest tightness, shortness of breath and wheezing.   Cardiovascular: Negative for chest pain and palpitations.  Gastrointestinal:  Negative for abdominal distention, abdominal pain and constipation.  Endocrine: Negative for polydipsia.  Genitourinary: Negative  for dysuria and frequency.  Musculoskeletal: Negative for arthralgias and back pain.  Skin:       See hpi  Neurological: Negative for tremors, light-headedness and numbness.  Hematological: Does not bruise/bleed easily.  Psychiatric/Behavioral: Negative for agitation and behavioral problems.  Objective:  BP 135/69 (BP Location: Right Arm, Patient Position: Sitting, Cuff Size: Normal)   Pulse 79   Temp 99 F (37.2 C) (Oral)   Resp 18   Ht 6\' 1"  (1.854 m)   Wt 219 lb (99.3 kg)   LMP 10/13/2014 (Exact Date)   SpO2 99%   BMI 28.89 kg/m   BP/Weight 08/27/2016 10/14/6201 08/18/9739  Systolic BP 638 453 646  Diastolic BP 69 72 74  Wt. (Lbs) 219 218 -  BMI 28.89 30.84 -      Physical Exam Constitutional: She is oriented to person, place, and time. She appears well-developed and well-nourished.  Cardiovascular: Normal rate, normal heart sounds and intact distal pulses.   No murmur heard. Pulmonary/Chest: Effort normal and breath sounds normal. She has no wheezes. She has no rales. She exhibits no tenderness.  Right upper chest wall permacath  Abdominal: Soft. Bowel sounds are normal. She exhibits no distension and no mass. There is no tenderness.  Musculoskeletal: Normal range of motion.  Left AV fistula not fully mature with palpable thrill  Neurological: She is alert and oriented to person, place, and time.  Skin:  Macerated skin on  right big toe. Ingrown toenail on right big toe; left big toe with bruise underneath skin tender to palpation on the plantar aspect   Lab Results  Component Value Date   HGBA1C 6.5 08/13/2016    Assessment & Plan:   1. Type 2 diabetes mellitus with diabetic neuropathy, with long-term current use of insulin (HCC) Controlled with A1c of 6.5 Continue current management - Glucose (CBG)  2. ESRD (end stage renal  disease) ([place]) Continue hemodialysis as per protocol  3. Skin ulcer of toe of right foot with fat layer exposed ([place]) Completed course of Bactrim 3 weeks total High risk patient given underlying history of diabetes and PVD Dressing change performed in the clinic We'll need to exclude osteomyelitis-we'll start with x-ray and then MRI if needed - DG Foot Complete Right; Future  4. Ingrown left big toenail Advised to keep a close follow-up with podiatry-has upcoming appointment in 1 week   Meds ordered this encounter  Medications  . traMADol (ULTRAM) 50 MG tablet    Sig: Take 1 tablet (50 mg total) by mouth every 12 (twelve) hours as needed.    Dispense:  30 tablet    Refill:  0    Follow-up: Return in about 1 month (around 09/27/2016) for Complete physical exam.   Arnoldo Morale MD

## 2016-08-27 NOTE — Telephone Encounter (Signed)
This encounter was created in error - please disregard.

## 2016-08-27 NOTE — Progress Notes (Signed)
Patient is here for Toe Pain  Patient states her toes were clipped by the podiatrist and the provider clipped to low on the nail bed and the tow now has extreme pain and irritation.  Patient has taken medication today. Patient has eaten today.

## 2016-08-28 DIAGNOSIS — E1129 Type 2 diabetes mellitus with other diabetic kidney complication: Secondary | ICD-10-CM | POA: Diagnosis not present

## 2016-08-28 DIAGNOSIS — D509 Iron deficiency anemia, unspecified: Secondary | ICD-10-CM | POA: Diagnosis not present

## 2016-08-28 DIAGNOSIS — N2581 Secondary hyperparathyroidism of renal origin: Secondary | ICD-10-CM | POA: Diagnosis not present

## 2016-08-28 DIAGNOSIS — Z23 Encounter for immunization: Secondary | ICD-10-CM | POA: Diagnosis not present

## 2016-08-28 DIAGNOSIS — N186 End stage renal disease: Secondary | ICD-10-CM | POA: Diagnosis not present

## 2016-08-28 DIAGNOSIS — D631 Anemia in chronic kidney disease: Secondary | ICD-10-CM | POA: Diagnosis not present

## 2016-08-30 DIAGNOSIS — E1129 Type 2 diabetes mellitus with other diabetic kidney complication: Secondary | ICD-10-CM | POA: Diagnosis not present

## 2016-08-30 DIAGNOSIS — D509 Iron deficiency anemia, unspecified: Secondary | ICD-10-CM | POA: Diagnosis not present

## 2016-08-30 DIAGNOSIS — Z23 Encounter for immunization: Secondary | ICD-10-CM | POA: Diagnosis not present

## 2016-08-30 DIAGNOSIS — D631 Anemia in chronic kidney disease: Secondary | ICD-10-CM | POA: Diagnosis not present

## 2016-08-30 DIAGNOSIS — N186 End stage renal disease: Secondary | ICD-10-CM | POA: Diagnosis not present

## 2016-08-30 DIAGNOSIS — N2581 Secondary hyperparathyroidism of renal origin: Secondary | ICD-10-CM | POA: Diagnosis not present

## 2016-09-02 DIAGNOSIS — N2581 Secondary hyperparathyroidism of renal origin: Secondary | ICD-10-CM | POA: Diagnosis not present

## 2016-09-02 DIAGNOSIS — E1129 Type 2 diabetes mellitus with other diabetic kidney complication: Secondary | ICD-10-CM | POA: Diagnosis not present

## 2016-09-02 DIAGNOSIS — N186 End stage renal disease: Secondary | ICD-10-CM | POA: Diagnosis not present

## 2016-09-02 DIAGNOSIS — Z23 Encounter for immunization: Secondary | ICD-10-CM | POA: Diagnosis not present

## 2016-09-02 DIAGNOSIS — D509 Iron deficiency anemia, unspecified: Secondary | ICD-10-CM | POA: Diagnosis not present

## 2016-09-02 DIAGNOSIS — D631 Anemia in chronic kidney disease: Secondary | ICD-10-CM | POA: Diagnosis not present

## 2016-09-03 ENCOUNTER — Encounter: Payer: Self-pay | Admitting: Podiatry

## 2016-09-03 ENCOUNTER — Ambulatory Visit (INDEPENDENT_AMBULATORY_CARE_PROVIDER_SITE_OTHER): Payer: Medicare Other | Admitting: Podiatry

## 2016-09-03 DIAGNOSIS — E1142 Type 2 diabetes mellitus with diabetic polyneuropathy: Secondary | ICD-10-CM

## 2016-09-03 DIAGNOSIS — L97519 Non-pressure chronic ulcer of other part of right foot with unspecified severity: Secondary | ICD-10-CM

## 2016-09-03 DIAGNOSIS — E11621 Type 2 diabetes mellitus with foot ulcer: Secondary | ICD-10-CM

## 2016-09-03 MED ORDER — CADEXOMER IODINE 0.9 % EX GEL: 1.0000 "application " | Freq: Every day | CUTANEOUS | 0 refills | Status: DC | PRN

## 2016-09-03 NOTE — Progress Notes (Signed)
   Subjective:    Patient ID: Brooke Stafford, female    DOB: [birth year], 61 y.o.   MRN: [id number]  HPI this patient presents the office with chief complaint of a healing area on the tip of her right .  She says she caused the injury trimming an ingrowing toenail on the inside border of the right big toe. The injury occurred on April 8.   She was initially treated by another doctor with antibiotics which prevented any infection from occurring. She was seen by myself on 08/15/2016 because the ulcer on the tip of the right toe was not healing. She was told at that time to soak her foot and bandaged her right great toe and to return to the office in 2 weeks for further evaluation. She presents the office today stating that there is still bleeding coming from the site of the ulcer. She says that whenever she pulls off her bandage at the site of the ulcer  the skin and  Ulcer bleeds. She also says it is very painful when she does remove the bandage, but is more painful at night during sleep. She presents to  the office wearing tightfitting shoes in the toebox region. She  presents to the office today for continued evaluation and treatment of her diabetic ulcer    Review of Systems  All other systems reviewed and are negative.      Objective:   Physical Exam GENERAL APPEARANCE: Alert, conversant. Appropriately groomed. No acute distress.  VASCULAR: Pedal pulses are  palpable at  [facility] and PT bilateral.  Capillary refill time is immediate to all digits,  Normal temperature gradient.    NEUROLOGIC: sensation is diminished  to 5.07 monofilament at 5/5 sites bilateral.  Light touch is intact bilateral, Muscle strength normal.  MUSCULOSKELETAL: acceptable muscle strength, tone and stability bilateral.  Intrinsic muscluature intact bilateral.  Rectus appearance of foot and digits noted bilateral.   DERMATOLOGIC: skin color, texture, and turgor are within normal limits except at the site of injury.  There is a  black skin necrosis noted at the distal medial  aspect of the right hallux.  This black skin necrosis is covering a diabetic ulcer of approximately 10 mm x 10 mm and her ulcer measures 15 mm. X 20 mm.. No evidence of any redness, swelling noted at the tip right hallux.    NAILS  thick disfigured discolored, incurvated hallux toenails bilaterally.           Assessment & Plan:  Diabetic Ulcer right hallux.      Debride ulcer and ingrown tonail medial border right hallux.  Skin necrosis distal aspect right hallux persists.  Iodosorb/DSD applied. The diabetic ulcer does not seem to be improving. I am concerned about the fact that it is 7 weeks and still shows no evidence of  Healing.  Therefore, I have ordered. Iodosorb to be used in redressing the ulcer. I also prescribed a Darco shoe to be worn instead  of her tight footgear.  Patient was told to continue home soaks.  She will be seen in 10 days and if there is no improvement I will consult with the doctors and consider referring her to the wound care for continued evaluation and treatment of this diabetic ulcer   Gardiner Barefoot DPM

## 2016-09-04 DIAGNOSIS — Z23 Encounter for immunization: Secondary | ICD-10-CM | POA: Diagnosis not present

## 2016-09-04 DIAGNOSIS — E1129 Type 2 diabetes mellitus with other diabetic kidney complication: Secondary | ICD-10-CM | POA: Diagnosis not present

## 2016-09-04 DIAGNOSIS — N186 End stage renal disease: Secondary | ICD-10-CM | POA: Diagnosis not present

## 2016-09-04 DIAGNOSIS — D509 Iron deficiency anemia, unspecified: Secondary | ICD-10-CM | POA: Diagnosis not present

## 2016-09-04 DIAGNOSIS — D631 Anemia in chronic kidney disease: Secondary | ICD-10-CM | POA: Diagnosis not present

## 2016-09-04 DIAGNOSIS — N2581 Secondary hyperparathyroidism of renal origin: Secondary | ICD-10-CM | POA: Diagnosis not present

## 2016-09-05 ENCOUNTER — Telehealth: Payer: Self-pay | Admitting: *Deleted

## 2016-09-05 DIAGNOSIS — L97519 Non-pressure chronic ulcer of other part of right foot with unspecified severity: Principal | ICD-10-CM

## 2016-09-05 DIAGNOSIS — E11621 Type 2 diabetes mellitus with foot ulcer: Secondary | ICD-10-CM

## 2016-09-05 NOTE — Telephone Encounter (Addendum)
Pt states the pharmacy is having a problem with insurance coverage, please call in an alternative.09/05/2016-DrPrudence Davidson states have paint wound with betadine daily and cover with dry gauze. Orders called to pt.09/18/2016-Orders of 09/17/2016 3:56pm faxed to [facility].

## 2016-09-06 ENCOUNTER — Other Ambulatory Visit: Payer: Self-pay

## 2016-09-06 DIAGNOSIS — D631 Anemia in chronic kidney disease: Secondary | ICD-10-CM | POA: Diagnosis not present

## 2016-09-06 DIAGNOSIS — Z23 Encounter for immunization: Secondary | ICD-10-CM | POA: Diagnosis not present

## 2016-09-06 DIAGNOSIS — N2581 Secondary hyperparathyroidism of renal origin: Secondary | ICD-10-CM | POA: Diagnosis not present

## 2016-09-06 DIAGNOSIS — N186 End stage renal disease: Secondary | ICD-10-CM | POA: Diagnosis not present

## 2016-09-06 DIAGNOSIS — E1129 Type 2 diabetes mellitus with other diabetic kidney complication: Secondary | ICD-10-CM | POA: Diagnosis not present

## 2016-09-06 DIAGNOSIS — D509 Iron deficiency anemia, unspecified: Secondary | ICD-10-CM | POA: Diagnosis not present

## 2016-09-06 MED ORDER — BASAGLAR KWIKPEN 100 UNIT/ML ~~LOC~~ SOPN: PEN_INJECTOR | SUBCUTANEOUS | 0 refills | Status: DC

## 2016-09-09 DIAGNOSIS — Z23 Encounter for immunization: Secondary | ICD-10-CM | POA: Diagnosis not present

## 2016-09-09 DIAGNOSIS — N2581 Secondary hyperparathyroidism of renal origin: Secondary | ICD-10-CM | POA: Diagnosis not present

## 2016-09-09 DIAGNOSIS — N186 End stage renal disease: Secondary | ICD-10-CM | POA: Diagnosis not present

## 2016-09-09 DIAGNOSIS — D631 Anemia in chronic kidney disease: Secondary | ICD-10-CM | POA: Diagnosis not present

## 2016-09-09 DIAGNOSIS — E1129 Type 2 diabetes mellitus with other diabetic kidney complication: Secondary | ICD-10-CM | POA: Diagnosis not present

## 2016-09-09 DIAGNOSIS — D509 Iron deficiency anemia, unspecified: Secondary | ICD-10-CM | POA: Diagnosis not present

## 2016-09-11 DIAGNOSIS — E1129 Type 2 diabetes mellitus with other diabetic kidney complication: Secondary | ICD-10-CM | POA: Diagnosis not present

## 2016-09-11 DIAGNOSIS — N2581 Secondary hyperparathyroidism of renal origin: Secondary | ICD-10-CM | POA: Diagnosis not present

## 2016-09-11 DIAGNOSIS — Z23 Encounter for immunization: Secondary | ICD-10-CM | POA: Diagnosis not present

## 2016-09-11 DIAGNOSIS — D631 Anemia in chronic kidney disease: Secondary | ICD-10-CM | POA: Diagnosis not present

## 2016-09-11 DIAGNOSIS — D509 Iron deficiency anemia, unspecified: Secondary | ICD-10-CM | POA: Diagnosis not present

## 2016-09-11 DIAGNOSIS — N186 End stage renal disease: Secondary | ICD-10-CM | POA: Diagnosis not present

## 2016-09-12 DIAGNOSIS — Z992 Dependence on renal dialysis: Secondary | ICD-10-CM | POA: Diagnosis not present

## 2016-09-12 DIAGNOSIS — I129 Hypertensive chronic kidney disease with stage 1 through stage 4 chronic kidney disease, or unspecified chronic kidney disease: Secondary | ICD-10-CM | POA: Diagnosis not present

## 2016-09-12 DIAGNOSIS — N186 End stage renal disease: Secondary | ICD-10-CM | POA: Diagnosis not present

## 2016-09-13 DIAGNOSIS — D509 Iron deficiency anemia, unspecified: Secondary | ICD-10-CM | POA: Diagnosis not present

## 2016-09-13 DIAGNOSIS — E1129 Type 2 diabetes mellitus with other diabetic kidney complication: Secondary | ICD-10-CM | POA: Diagnosis not present

## 2016-09-13 DIAGNOSIS — N2581 Secondary hyperparathyroidism of renal origin: Secondary | ICD-10-CM | POA: Diagnosis not present

## 2016-09-13 DIAGNOSIS — N186 End stage renal disease: Secondary | ICD-10-CM | POA: Diagnosis not present

## 2016-09-13 DIAGNOSIS — D631 Anemia in chronic kidney disease: Secondary | ICD-10-CM | POA: Diagnosis not present

## 2016-09-13 DIAGNOSIS — R509 Fever, unspecified: Secondary | ICD-10-CM | POA: Diagnosis not present

## 2016-09-16 DIAGNOSIS — N186 End stage renal disease: Secondary | ICD-10-CM | POA: Diagnosis not present

## 2016-09-16 DIAGNOSIS — E1129 Type 2 diabetes mellitus with other diabetic kidney complication: Secondary | ICD-10-CM | POA: Diagnosis not present

## 2016-09-16 DIAGNOSIS — R509 Fever, unspecified: Secondary | ICD-10-CM | POA: Diagnosis not present

## 2016-09-16 DIAGNOSIS — N2581 Secondary hyperparathyroidism of renal origin: Secondary | ICD-10-CM | POA: Diagnosis not present

## 2016-09-16 DIAGNOSIS — D509 Iron deficiency anemia, unspecified: Secondary | ICD-10-CM | POA: Diagnosis not present

## 2016-09-16 DIAGNOSIS — D631 Anemia in chronic kidney disease: Secondary | ICD-10-CM | POA: Diagnosis not present

## 2016-09-17 ENCOUNTER — Ambulatory Visit (INDEPENDENT_AMBULATORY_CARE_PROVIDER_SITE_OTHER): Payer: Medicare Other | Admitting: Sports Medicine

## 2016-09-17 ENCOUNTER — Ambulatory Visit (INDEPENDENT_AMBULATORY_CARE_PROVIDER_SITE_OTHER): Payer: Medicare Other

## 2016-09-17 ENCOUNTER — Encounter: Payer: Self-pay | Admitting: Sports Medicine

## 2016-09-17 ENCOUNTER — Ambulatory Visit: Payer: Medicare Other

## 2016-09-17 DIAGNOSIS — I2510 Atherosclerotic heart disease of native coronary artery without angina pectoris: Secondary | ICD-10-CM | POA: Diagnosis not present

## 2016-09-17 DIAGNOSIS — R52 Pain, unspecified: Secondary | ICD-10-CM

## 2016-09-17 DIAGNOSIS — Z9861 Coronary angioplasty status: Secondary | ICD-10-CM

## 2016-09-17 DIAGNOSIS — L97519 Non-pressure chronic ulcer of other part of right foot with unspecified severity: Secondary | ICD-10-CM

## 2016-09-17 DIAGNOSIS — E1142 Type 2 diabetes mellitus with diabetic polyneuropathy: Secondary | ICD-10-CM

## 2016-09-17 DIAGNOSIS — E11621 Type 2 diabetes mellitus with foot ulcer: Secondary | ICD-10-CM

## 2016-09-17 DIAGNOSIS — M79674 Pain in right toe(s): Secondary | ICD-10-CM | POA: Diagnosis not present

## 2016-09-17 NOTE — Progress Notes (Signed)
Subjective: Brooke Stafford is a 61 y.o. female patient seen in office for evaluation of ulceration of the right great toe. Patient has a history of diabetes and a blood glucose level not recorded. Patient sent by Dr. Prudence Davidson for evaluation who reports that has not healed after 7 weeks  Patient is changing the dressing using betadine with a little improvement. Denies nausea/fever/vomiting/chills/night sweats/shortness of breath/pain. Patient has no other pedal complaints at this time.  Patient is also a dialysis patient.   Patient Active Problem List   Diagnosis Date Noted  . AV (arteriovenous fistula) ([place]) 06/17/2016  . Mixed hyperlipidemia 05/21/2016  . ESRD (end stage renal disease) ([place])   . Hypokalemia 05/03/2016  . COPD exacerbation ([place]) 05/03/2016  . COPD (chronic obstructive pulmonary disease) ([place]) 04/11/2016  . CAP (community acquired pneumonia) 03/26/2016  . Acute respiratory failure with hypoxia ([place]) 03/26/2016  . Acute on chronic combined systolic and diastolic CHF (congestive heart failure) ([place]) 03/26/2016  . Chronic kidney disease (CKD), stage IV (severe) ([place]) 03/26/2016  . Medication management 03/21/2016  . Anemia of chronic disease 12/27/2015  . Acute on chronic systolic CHF (congestive heart failure) ([place])   . Acute CHF ([place]) 12/18/2015  . Hypertensive heart and renal disease with CHF and ESRD ([place]) 12/18/2015  . Constipation 12/18/2015  . Adjustment disorder with anxious mood 11/02/2015  . Posterior circulation stroke ([place]) 11/02/2015  . Abnormality of gait 10/23/2015  . Depression 09/28/2015  . Diabetes mellitus due to underlying condition with diabetic peripheral angiopathy without gangrene, with long-term current use of insulin ([place]) 09/13/2015  . Diabetic retinopathy ([place]) 09/13/2015  . Grief reaction 08/24/2015  . Acute kidney injury superimposed on chronic kidney disease ([place]) 07/25/2015  . Creatinine elevation 07/25/2015  . Chronic combined systolic and  diastolic congestive heart failure ([place]) 07/25/2015  . Rash of hands 02/23/2015  . Essential hypertension   . Angina pectoris ([place]) 12/12/2014  . Abnormal nuclear stress test   . Acute combined systolic and diastolic heart failure ([place]) 12/02/2014  . Seasonal allergies 08/11/2014  . Gout 08/11/2014  . Encounter for screening mammogram for breast cancer 05/16/2014  . Screening for colon cancer 05/16/2014  . Diabetic neuropathy, type II diabetes mellitus ([place]) 01/27/2014  . Atopic eczema 01/27/2014  . Precordial pain, atypical, negative MI, Musculature Skeletal pain  11/08/2013  . CKD (chronic kidney disease) stage 5, GFR less than 15 ml/min (HCC) 11/08/2013  . Unstable angina ([place]) 09/28/2013  . Ischemic cardiomyopathy- new drop in EF 08/30/2013  . Chest pain 08/04/2013  . CAD -S/P PCI June 2015 and 12/14/14 02/01/2013  . Hypothyroidism, acquired 02/01/2013  . PVD, LSFA PTA 12/12 04/06/2011  . Hx of CABG x 6 2002 04/06/2011  . Dyslipidemia 04/06/2011   Current Outpatient Prescriptions on File Prior to Visit  Medication Sig Dispense Refill  . acetaminophen-codeine (TYLENOL #3) 300-30 MG tablet Take 1 tablet by mouth 2 (two) times daily as needed for moderate pain.    [name] albuterol (PROVENTIL HFA;VENTOLIN HFA) 108 (90 Base) MCG/ACT inhaler Inhale 2 puffs into the lungs every 6 (six) hours as needed for wheezing or shortness of breath. 3 Inhaler 3  . albuterol (PROVENTIL) (2.5 MG/3ML) 0.083% nebulizer solution Take 3 mLs (2.5 mg total) by nebulization every 6 (six) hours as needed for wheezing or shortness of breath. 150 mL 3  . allopurinol (ZYLOPRIM) 300 MG tablet Take 1 tablet (300 mg total) by mouth daily. 90 tablet 3  . aspirin 81 MG chewable tablet Chew  1 tablet (81 mg total) by mouth daily. 30 tablet 10  . atorvastatin (LIPITOR) 40 MG tablet Take 1 tablet (40 mg total) by mouth every evening. 30 tablet 3  . cadexomer iodine (IODOSORB) 0.9 % gel Apply 1 application topically daily as  needed for wound care. 40 g 0  . citalopram (CELEXA) 20 MG tablet Take 1 tablet (20 mg total) by mouth daily. 30 tablet 3  . clopidogrel (PLAVIX) 75 MG tablet Take 1 tablet (75 mg total) by mouth daily. 90 tablet 3  . ezetimibe (ZETIA) 10 MG tablet Take 1 tablet (10 mg total) by mouth daily. 90 tablet 3  . ferric gluconate 125 mg in sodium chloride 0.9 % 100 mL Inject 125 mg into the vein Every Tuesday,Thursday,and Saturday with dialysis.    . fluticasone (FLONASE) 50 MCG/ACT nasal spray Place 2 sprays into both nostrils daily as needed for allergies or rhinitis. 16 g 3  . gabapentin (NEURONTIN) 400 MG capsule Take 400 mg by mouth daily.     . hydrOXYzine (ATARAX/VISTARIL) 25 MG tablet Take 1 tablet (25 mg total) by mouth daily as needed for anxiety or itching.    . insulin aspart (NOVOLOG) 100 UNIT/ML injection Inject 25-30 Units into the skin 3 (three) times daily before meals. Pens 15 mL 5  . Insulin Glargine (BASAGLAR KWIKPEN) 100 UNIT/ML SOPN Inject 25 units into the skin at bedtime. 10 pen 0  . isosorbide mononitrate (IMDUR) 60 MG 24 hr tablet Take 60 mg by mouth daily.    [name] levothyroxine (SYNTHROID, LEVOTHROID) 25 MCG tablet take 39mcg by mouth every morning BEFORE BREAKFAST 30 tablet 2  . lidocaine-prilocaine (EMLA) cream Apply 1 application topically as directed.  1  . loratadine (CLARITIN) 10 MG tablet Take 10 mg by mouth daily.    . metoprolol tartrate (LOPRESSOR) 25 MG tablet Take 0.5 tablets (12.5 mg total) by mouth 2 (two) times daily. 90 tablet 3  . Naphazoline HCl (CLEAR EYES OP) Place 1 drop into both eyes as needed (for dry eyes).    . nitroGLYCERIN (NITROSTAT) 0.4 MG SL tablet Place 1 tablet (0.4 mg total) under the tongue every 5 (five) minutes as needed. (Patient taking differently: Place 0.4 mg under the tongue every 5 (five) minutes as needed for chest pain. ) 30 tablet 2  . prednisoLONE acetate (PRED FORTE) 1 % ophthalmic suspension Place 1 drop into both eyes 2 (two) times  daily.   1  . ranitidine (ZANTAC 75) 75 MG tablet Take 1 tablet (75 mg total) by mouth daily as needed for heartburn.    [name] RENVELA 800 MG tablet     . senna-docusate (SENOKOT-S) 8.6-50 MG tablet Take 1 tablet by mouth at bedtime as needed for mild constipation. 30 tablet 0  . sevelamer (RENAGEL) 800 MG tablet Take 2,400 mg by mouth 3 (three) times daily with meals.    [name] sulfamethoxazole-trimethoprim (BACTRIM DS) 800-160 MG tablet Take 1 tablet by mouth 2 (two) times daily. 20 tablet 0  . traMADol (ULTRAM) 50 MG tablet Take 1 tablet (50 mg total) by mouth every 12 (twelve) hours as needed. 30 tablet 0  . triamcinolone ointment (KENALOG) 0.5 % Apply 1 application topically daily.     No current facility-administered medications on file prior to visit.    Allergies  Allergen Reactions  . Digoxin And Related Diarrhea and Other (See Comments)    TOXIC DRUG LEVELS Patient stated she almost died. Had flu like symptoms as well as diarrhea.  [name]  Hydralazine Shortness Of Breath  . Penicillins Cross Reactors Hives and Other (See Comments)    HIGH FEVER  Has patient had a PCN reaction causing immediate rash, facial/tongue/throat swelling, SOB or lightheadedness with hypotension: No Has patient had a PCN reaction causing SEVERE RASH INVOLVING MUCUS MEMBRANES or SKIN NECROSIS  #  #  #  YES  #  #  #  Has patient had a PCN reaction that required hospitalization: Unk Has patient had a PCN reaction occurring within the last 10 years: Unk If all of the above answers are "NO", then may proceed with Cephalosporin use.   [name] Lisinopril Other (See Comments)    Felt like she had the flu. Was very sick!!!  . Adhesive [Tape] Rash    bruising    Recent Results (from the past 2160 hour(s))  I-STAT 4, (NA,K, GLUC, HGB,HCT)     Status: Abnormal   Collection Time: 06/26/16  8:35 AM  Result Value Ref Range   Sodium 137 135 - 145 mmol/L   Potassium 4.1 3.5 - 5.1 mmol/L   Glucose, Bld 148 (H) 65 - 99 mg/dL   HCT  35.0 (L) 36.0 - 46.0 %   Hemoglobin 11.9 (L) 12.0 - 15.0 g/dL  Glucose, capillary     Status: Abnormal   Collection Time: 06/26/16 10:31 AM  Result Value Ref Range   Glucose-Capillary 115 (H) 65 - 99 mg/dL  Glucose, capillary     Status: Abnormal   Collection Time: 06/26/16  1:15 PM  Result Value Ref Range   Glucose-Capillary 138 (H) 65 - 99 mg/dL  POCT glycosylated hemoglobin (Hb A1C)     Status: Abnormal   Collection Time: 08/13/16  4:01 PM  Result Value Ref Range   Hemoglobin A1C 6.5   Glucose (CBG)     Status: Abnormal   Collection Time: 08/27/16  4:13 PM  Result Value Ref Range   POC Glucose 168 (A) 70 - 99 mg/dl    Objective: There were no vitals filed for this visit.   General: Patient is awake, alert, oriented x 3 and in no acute distress.  Dermatology: Skin is warm and dry bilateral with a full thickness thickness ulceration present at right great toe. Ulceration measures 1 cm x 1 cm x 0.3 cm. There is a keratotic border with a fibrogranular base. The ulceration does not probe to bone. There is no malodor, no active drainage, no erythema, no edema. No other acute signs of infection.    Vascular: Dorsalis Pedis pulse = 1/4 Bilateral,  Posterior Tibial pulse = 0/4 Bilateral,  Capillary Fill Time < 5 seconds  Neurologic: Protective sensation absent bilateral.  Musculosketal:  Mild Pain with palpation to ulcerated area. No pain with compression to calves bilateral. Asymptomatic hammertoe bony deformities noted bilateral.  Xrays, Right foot: No bony destruction suggestive of osteomyelitis. No gas in soft tissues.Hammertoe deformity.     Recent Labs  05/04/16 [place] WBC PRESENT,BOTH PMN AND MONONUCLEARNO ORGANISMS SEEN    Assessment and Plan:  Problem List Items Addressed This Visit    None    Visit Diagnoses    Pain    -  Primary   Relevant Orders   DG Foot Complete Right   DG Foot Complete Right   Ulcer of right great toe due to diabetes  mellitus ([place])       Diabetic polyneuropathy associated with type 2 diabetes mellitus ([place])         -Examined patient and  discussed the progression of the wound and treatment alternatives. -Xrays reviewed - Excisionally dedbrided ulceration to healthy bleeding borders using a sterile chisel  blade. -Applied betadine and dry sterile dressing and instructed patient to continue with daily dressings at home consisting of the same. -Continue post op shoe  -Advised patient to go to the ER or return to office if the wound worsens or if constitutional symptoms are present. -Rx ABI/PVRs  -Patient to return to office after vascular studies for follow up care and evaluation or sooner if problems arise.  Landis Martins, DPM

## 2016-09-18 DIAGNOSIS — N186 End stage renal disease: Secondary | ICD-10-CM | POA: Diagnosis not present

## 2016-09-18 DIAGNOSIS — D631 Anemia in chronic kidney disease: Secondary | ICD-10-CM | POA: Diagnosis not present

## 2016-09-18 DIAGNOSIS — E1129 Type 2 diabetes mellitus with other diabetic kidney complication: Secondary | ICD-10-CM | POA: Diagnosis not present

## 2016-09-18 DIAGNOSIS — N2581 Secondary hyperparathyroidism of renal origin: Secondary | ICD-10-CM | POA: Diagnosis not present

## 2016-09-18 DIAGNOSIS — D509 Iron deficiency anemia, unspecified: Secondary | ICD-10-CM | POA: Diagnosis not present

## 2016-09-18 DIAGNOSIS — R509 Fever, unspecified: Secondary | ICD-10-CM | POA: Diagnosis not present

## 2016-09-18 NOTE — Telephone Encounter (Signed)
-----   Message from Landis Martins, [place] sent at 09/17/2016  3:56 PM EDT ----- Regarding: ABI/TBIs  Test tues or thurs due to dialysis ABI/PVRs for toe ulcer nonhealing  -Dr. Cannon Kettle

## 2016-09-20 DIAGNOSIS — R509 Fever, unspecified: Secondary | ICD-10-CM | POA: Diagnosis not present

## 2016-09-20 DIAGNOSIS — E1129 Type 2 diabetes mellitus with other diabetic kidney complication: Secondary | ICD-10-CM | POA: Diagnosis not present

## 2016-09-20 DIAGNOSIS — N2581 Secondary hyperparathyroidism of renal origin: Secondary | ICD-10-CM | POA: Diagnosis not present

## 2016-09-20 DIAGNOSIS — D509 Iron deficiency anemia, unspecified: Secondary | ICD-10-CM | POA: Diagnosis not present

## 2016-09-20 DIAGNOSIS — D631 Anemia in chronic kidney disease: Secondary | ICD-10-CM | POA: Diagnosis not present

## 2016-09-20 DIAGNOSIS — N186 End stage renal disease: Secondary | ICD-10-CM | POA: Diagnosis not present

## 2016-09-23 DIAGNOSIS — Z452 Encounter for adjustment and management of vascular access device: Secondary | ICD-10-CM | POA: Diagnosis not present

## 2016-09-23 DIAGNOSIS — D509 Iron deficiency anemia, unspecified: Secondary | ICD-10-CM | POA: Diagnosis not present

## 2016-09-23 DIAGNOSIS — N186 End stage renal disease: Secondary | ICD-10-CM | POA: Diagnosis not present

## 2016-09-23 DIAGNOSIS — D631 Anemia in chronic kidney disease: Secondary | ICD-10-CM | POA: Diagnosis not present

## 2016-09-23 DIAGNOSIS — E1129 Type 2 diabetes mellitus with other diabetic kidney complication: Secondary | ICD-10-CM | POA: Diagnosis not present

## 2016-09-23 DIAGNOSIS — N2581 Secondary hyperparathyroidism of renal origin: Secondary | ICD-10-CM | POA: Diagnosis not present

## 2016-09-23 DIAGNOSIS — R509 Fever, unspecified: Secondary | ICD-10-CM | POA: Diagnosis not present

## 2016-09-24 DIAGNOSIS — H3561 Retinal hemorrhage, right eye: Secondary | ICD-10-CM | POA: Diagnosis not present

## 2016-09-24 DIAGNOSIS — Z961 Presence of intraocular lens: Secondary | ICD-10-CM | POA: Diagnosis not present

## 2016-09-24 DIAGNOSIS — E113411 Type 2 diabetes mellitus with severe nonproliferative diabetic retinopathy with macular edema, right eye: Secondary | ICD-10-CM | POA: Diagnosis not present

## 2016-09-24 DIAGNOSIS — E113412 Type 2 diabetes mellitus with severe nonproliferative diabetic retinopathy with macular edema, left eye: Secondary | ICD-10-CM | POA: Diagnosis not present

## 2016-09-25 DIAGNOSIS — E1129 Type 2 diabetes mellitus with other diabetic kidney complication: Secondary | ICD-10-CM | POA: Diagnosis not present

## 2016-09-25 DIAGNOSIS — D509 Iron deficiency anemia, unspecified: Secondary | ICD-10-CM | POA: Diagnosis not present

## 2016-09-25 DIAGNOSIS — D631 Anemia in chronic kidney disease: Secondary | ICD-10-CM | POA: Diagnosis not present

## 2016-09-25 DIAGNOSIS — N2581 Secondary hyperparathyroidism of renal origin: Secondary | ICD-10-CM | POA: Diagnosis not present

## 2016-09-25 DIAGNOSIS — N186 End stage renal disease: Secondary | ICD-10-CM | POA: Diagnosis not present

## 2016-09-25 DIAGNOSIS — R509 Fever, unspecified: Secondary | ICD-10-CM | POA: Diagnosis not present

## 2016-09-27 ENCOUNTER — Other Ambulatory Visit: Payer: Self-pay | Admitting: Cardiovascular Disease

## 2016-09-27 DIAGNOSIS — R509 Fever, unspecified: Secondary | ICD-10-CM | POA: Diagnosis not present

## 2016-09-27 DIAGNOSIS — N186 End stage renal disease: Secondary | ICD-10-CM | POA: Diagnosis not present

## 2016-09-27 DIAGNOSIS — D631 Anemia in chronic kidney disease: Secondary | ICD-10-CM | POA: Diagnosis not present

## 2016-09-27 DIAGNOSIS — N2581 Secondary hyperparathyroidism of renal origin: Secondary | ICD-10-CM | POA: Diagnosis not present

## 2016-09-27 DIAGNOSIS — D509 Iron deficiency anemia, unspecified: Secondary | ICD-10-CM | POA: Diagnosis not present

## 2016-09-27 DIAGNOSIS — E1129 Type 2 diabetes mellitus with other diabetic kidney complication: Secondary | ICD-10-CM | POA: Diagnosis not present

## 2016-09-27 NOTE — Telephone Encounter (Signed)
REFILL 

## 2016-09-29 ENCOUNTER — Other Ambulatory Visit: Payer: Self-pay | Admitting: Cardiovascular Disease

## 2016-09-30 DIAGNOSIS — E1129 Type 2 diabetes mellitus with other diabetic kidney complication: Secondary | ICD-10-CM | POA: Diagnosis not present

## 2016-09-30 DIAGNOSIS — D509 Iron deficiency anemia, unspecified: Secondary | ICD-10-CM | POA: Diagnosis not present

## 2016-09-30 DIAGNOSIS — N186 End stage renal disease: Secondary | ICD-10-CM | POA: Diagnosis not present

## 2016-09-30 DIAGNOSIS — D631 Anemia in chronic kidney disease: Secondary | ICD-10-CM | POA: Diagnosis not present

## 2016-09-30 DIAGNOSIS — N2581 Secondary hyperparathyroidism of renal origin: Secondary | ICD-10-CM | POA: Diagnosis not present

## 2016-09-30 DIAGNOSIS — R509 Fever, unspecified: Secondary | ICD-10-CM | POA: Diagnosis not present

## 2016-10-01 ENCOUNTER — Ambulatory Visit (INDEPENDENT_AMBULATORY_CARE_PROVIDER_SITE_OTHER): Payer: Medicare Other | Admitting: Sports Medicine

## 2016-10-01 ENCOUNTER — Encounter: Payer: Self-pay | Admitting: Sports Medicine

## 2016-10-01 DIAGNOSIS — E11621 Type 2 diabetes mellitus with foot ulcer: Secondary | ICD-10-CM

## 2016-10-01 DIAGNOSIS — I739 Peripheral vascular disease, unspecified: Secondary | ICD-10-CM

## 2016-10-01 DIAGNOSIS — L97519 Non-pressure chronic ulcer of other part of right foot with unspecified severity: Secondary | ICD-10-CM

## 2016-10-01 DIAGNOSIS — E1142 Type 2 diabetes mellitus with diabetic polyneuropathy: Secondary | ICD-10-CM

## 2016-10-01 NOTE — Progress Notes (Signed)
Subjective: Brooke Stafford is a 61 y.o. female patient seen in office for follow up evaluation of ulceration of the right great toe. Patient has a history of diabetes and a blood glucose level not recorded. Reports that she has circulation test next week.  Patient is changing the dressing using betadine with a little improvement. Denies nausea/fever/vomiting/chills/night sweats/shortness of breath/pain. Patient has no other pedal complaints at this time.  Patient is also a dialysis patient.   Patient Active Problem List   Diagnosis Date Noted  . AV (arteriovenous fistula) ([place]) 06/17/2016  . Mixed hyperlipidemia 05/21/2016  . ESRD (end stage renal disease) ([place])   . Hypokalemia 05/03/2016  . COPD exacerbation ([place]) 05/03/2016  . COPD (chronic obstructive pulmonary disease) ([place]) 04/11/2016  . CAP (community acquired pneumonia) 03/26/2016  . Acute respiratory failure with hypoxia ([place]) 03/26/2016  . Acute on chronic combined systolic and diastolic CHF (congestive heart failure) ([place]) 03/26/2016  . Chronic kidney disease (CKD), stage IV (severe) ([place]) 03/26/2016  . Medication management 03/21/2016  . Anemia of chronic disease 12/27/2015  . Acute on chronic systolic CHF (congestive heart failure) ([place])   . Acute CHF ([place]) 12/18/2015  . Hypertensive heart and renal disease with CHF and ESRD ([place]) 12/18/2015  . Constipation 12/18/2015  . Adjustment disorder with anxious mood 11/02/2015  . Posterior circulation stroke ([place]) 11/02/2015  . Abnormality of gait 10/23/2015  . Depression 09/28/2015  . Diabetes mellitus due to underlying condition with diabetic peripheral angiopathy without gangrene, with long-term current use of insulin ([place]) 09/13/2015  . Diabetic retinopathy ([place]) 09/13/2015  . Grief reaction 08/24/2015  . Acute kidney injury superimposed on chronic kidney disease ([place]) 07/25/2015  . Creatinine elevation 07/25/2015  . Chronic combined systolic and diastolic congestive heart  failure ([place]) 07/25/2015  . Rash of hands 02/23/2015  . Essential hypertension   . Angina pectoris ([place]) 12/12/2014  . Abnormal nuclear stress test   . Acute combined systolic and diastolic heart failure ([place]) 12/02/2014  . Seasonal allergies 08/11/2014  . Gout 08/11/2014  . Encounter for screening mammogram for breast cancer 05/16/2014  . Screening for colon cancer 05/16/2014  . Diabetic neuropathy, type II diabetes mellitus ([place]) 01/27/2014  . Atopic eczema 01/27/2014  . Precordial pain, atypical, negative MI, Musculature Skeletal pain  11/08/2013  . CKD (chronic kidney disease) stage 5, GFR less than 15 ml/min (H[facility]) 11/08/2013  . Unstable angina ([place]) 09/28/2013  . Ischemic cardiomyopathy- new drop in EF 08/30/2013  . Chest pain 08/04/2013  . CAD -S/P PCI June 2015 and 12/14/14 02/01/2013  . Hypothyroidism, acquired 02/01/2013  . PVD, LSFA PTA 12/12 04/06/2011  . Hx of CABG x 6 2002 04/06/2011  . Dyslipidemia 04/06/2011   Current Outpatient Prescriptions on File Prior to Visit  Medication Sig Dispense Refill  . acetaminophen-codeine (TYLENOL #3) 300-30 MG tablet Take 1 tablet by mouth 2 (two) times daily as needed for moderate pain.    [name] albuterol (PROVENTIL HFA;VENTOLIN HFA) 108 (90 Base) MCG/ACT inhaler Inhale 2 puffs into the lungs every 6 (six) hours as needed for wheezing or shortness of breath. 3 Inhaler 3  . albuterol (PROVENTIL) (2.5 MG/3ML) 0.083% nebulizer solution Take 3 mLs (2.5 mg total) by nebulization every 6 (six) hours as needed for wheezing or shortness of breath. 150 mL 3  . allopurinol (ZYLOPRIM) 300 MG tablet Take 1 tablet (300 mg total) by mouth daily. 90 tablet 3  . aspirin 81 MG chewable tablet Chew 1 tablet (81 mg total) by  mouth daily. 30 tablet 10  . atorvastatin (LIPITOR) 40 MG tablet Take 1 tablet (40 mg total) by mouth every evening. 30 tablet 3  . cadexomer iodine (IODOSORB) 0.9 % gel Apply 1 application topically daily as needed for wound care. 40 g 0   . citalopram (CELEXA) 20 MG tablet Take 1 tablet (20 mg total) by mouth daily. 30 tablet 3  . clopidogrel (PLAVIX) 75 MG tablet take 1 tablet by mouth once daily 90 tablet 3  . clopidogrel (PLAVIX) 75 MG tablet take 1 tablet by mouth once daily 90 tablet 3  . ezetimibe (ZETIA) 10 MG tablet Take 1 tablet (10 mg total) by mouth daily. 90 tablet 3  . ferric gluconate 125 mg in sodium chloride 0.9 % 100 mL Inject 125 mg into the vein Every Tuesday,Thursday,and Saturday with dialysis.    . fluticasone (FLONASE) 50 MCG/ACT nasal spray Place 2 sprays into both nostrils daily as needed for allergies or rhinitis. 16 g 3  . gabapentin (NEURONTIN) 400 MG capsule Take 400 mg by mouth daily.     . hydrOXYzine (ATARAX/VISTARIL) 25 MG tablet Take 1 tablet (25 mg total) by mouth daily as needed for anxiety or itching.    . insulin aspart (NOVOLOG) 100 UNIT/ML injection Inject 25-30 Units into the skin 3 (three) times daily before meals. Pens 15 mL 5  . Insulin Glargine (BASAGLAR KWIKPEN) 100 UNIT/ML SOPN Inject 25 units into the skin at bedtime. 10 pen 0  . isosorbide mononitrate (IMDUR) 60 MG 24 hr tablet Take 60 mg by mouth daily.    [name] levothyroxine (SYNTHROID, LEVOTHROID) 25 MCG tablet take 26mcg by mouth every morning BEFORE BREAKFAST 30 tablet 2  . lidocaine-prilocaine (EMLA) cream Apply 1 application topically as directed.  1  . loratadine (CLARITIN) 10 MG tablet Take 10 mg by mouth daily.    . metoprolol tartrate (LOPRESSOR) 25 MG tablet Take 0.5 tablets (12.5 mg total) by mouth 2 (two) times daily. 90 tablet 3  . Naphazoline HCl (CLEAR EYES OP) Place 1 drop into both eyes as needed (for dry eyes).    . nitroGLYCERIN (NITROSTAT) 0.4 MG SL tablet Place 1 tablet (0.4 mg total) under the tongue every 5 (five) minutes as needed. (Patient taking differently: Place 0.4 mg under the tongue every 5 (five) minutes as needed for chest pain. ) 30 tablet 2  . prednisoLONE acetate (PRED FORTE) 1 % ophthalmic suspension  Place 1 drop into both eyes 2 (two) times daily.   1  . ranitidine (ZANTAC 75) 75 MG tablet Take 1 tablet (75 mg total) by mouth daily as needed for heartburn.    [name] RENVELA 800 MG tablet     . senna-docusate (SENOKOT-S) 8.6-50 MG tablet Take 1 tablet by mouth at bedtime as needed for mild constipation. 30 tablet 0  . sevelamer (RENAGEL) 800 MG tablet Take 2,400 mg by mouth 3 (three) times daily with meals.    [name] sulfamethoxazole-trimethoprim (BACTRIM DS) 800-160 MG tablet Take 1 tablet by mouth 2 (two) times daily. 20 tablet 0  . traMADol (ULTRAM) 50 MG tablet Take 1 tablet (50 mg total) by mouth every 12 (twelve) hours as needed. 30 tablet 0  . triamcinolone ointment (KENALOG) 0.5 % Apply 1 application topically daily.     No current facility-administered medications on file prior to visit.    Allergies  Allergen Reactions  . Digoxin And Related Diarrhea and Other (See Comments)    TOXIC DRUG LEVELS Patient stated she almost  died. Had flu like symptoms as well as diarrhea.  . Hydralazine Shortness Of Breath  . Penicillins Cross Reactors Hives and Other (See Comments)    HIGH FEVER  Has patient had a PCN reaction causing immediate rash, facial/tongue/throat swelling, SOB or lightheadedness with hypotension: No Has patient had a PCN reaction causing SEVERE RASH INVOLVING MUCUS MEMBRANES or SKIN NECROSIS  #  #  #  YES  #  #  #  Has patient had a PCN reaction that required hospitalization: Unk Has patient had a PCN reaction occurring within the last 10 years: Unk If all of the above answers are "NO", then may proceed with Cephalosporin use.   [name] Lisinopril Other (See Comments)    Felt like she had the flu. Was very sick!!!  . Adhesive [Tape] Rash    bruising    Recent Results (from the past 2160 hour(s))  POCT glycosylated hemoglobin (Hb A1C)     Status: Abnormal   Collection Time: 08/13/16  4:01 PM  Result Value Ref Range   Hemoglobin A1C 6.5   Glucose (CBG)     Status: Abnormal    Collection Time: 08/27/16  4:13 PM  Result Value Ref Range   POC Glucose 168 (A) 70 - 99 mg/dl    Objective: There were no vitals filed for this visit.   General: Patient is awake, alert, oriented x 3 and in no acute distress.  Dermatology: Skin is warm and dry bilateral with a full thickness thickness ulceration present at right great toe. Ulceration measures 1 cm x 0.9 cm x 0.3 cm. There is a keratotic border with a fibrogranular base. The ulceration does not probe to bone. There is no malodor, no active drainage, no erythema, no edema. No other acute signs of infection.    Vascular: Dorsalis Pedis pulse = 1/4 Bilateral,  Posterior Tibial pulse = 0/4 Bilateral,  Capillary Fill Time < 5 seconds  Neurologic: Protective sensation absent bilateral.  Musculosketal:  Mild Pain with palpation to ulcerated area. No pain with compression to calves bilateral. Asymptomatic hammertoe bony deformities noted bilateral.  Recent Labs  05/04/16 [place] WBC PRESENT,BOTH PMN AND MONONUCLEARNO ORGANISMS SEEN    Assessment and Plan:  Problem List Items Addressed This Visit    None    Visit Diagnoses    Ulcer of right great toe due to diabetes mellitus ([place])    -  Primary   Diabetic polyneuropathy associated with type 2 diabetes mellitus ([place])       PAD (peripheral artery disease) ([place])         -Examined patient and discussed the progression of the wound and treatment alternatives. - Excisionally dedbrided ulceration to healthy bleeding borders using a sterile chisel  blade. -Applied betadine and dry sterile dressing and instructed patient to continue with daily dressings at home consisting of the same. -Continue post op shoe  -Advised patient to go to the ER or return to office if the wound worsens or if constitutional symptoms are present. -Patient to get ABI/PVRs next week -Patient to return to office after vascular studies for follow up care and evaluation or sooner if  problems arise.  Landis Martins, DPM

## 2016-10-02 ENCOUNTER — Encounter: Payer: Medicare Other | Admitting: Internal Medicine

## 2016-10-02 DIAGNOSIS — R509 Fever, unspecified: Secondary | ICD-10-CM | POA: Diagnosis not present

## 2016-10-02 DIAGNOSIS — D509 Iron deficiency anemia, unspecified: Secondary | ICD-10-CM | POA: Diagnosis not present

## 2016-10-02 DIAGNOSIS — N186 End stage renal disease: Secondary | ICD-10-CM | POA: Diagnosis not present

## 2016-10-02 DIAGNOSIS — E1129 Type 2 diabetes mellitus with other diabetic kidney complication: Secondary | ICD-10-CM | POA: Diagnosis not present

## 2016-10-02 DIAGNOSIS — N2581 Secondary hyperparathyroidism of renal origin: Secondary | ICD-10-CM | POA: Diagnosis not present

## 2016-10-02 DIAGNOSIS — D631 Anemia in chronic kidney disease: Secondary | ICD-10-CM | POA: Diagnosis not present

## 2016-10-03 DIAGNOSIS — E113411 Type 2 diabetes mellitus with severe nonproliferative diabetic retinopathy with macular edema, right eye: Secondary | ICD-10-CM | POA: Diagnosis not present

## 2016-10-04 DIAGNOSIS — N186 End stage renal disease: Secondary | ICD-10-CM | POA: Diagnosis not present

## 2016-10-04 DIAGNOSIS — E1129 Type 2 diabetes mellitus with other diabetic kidney complication: Secondary | ICD-10-CM | POA: Diagnosis not present

## 2016-10-04 DIAGNOSIS — N2581 Secondary hyperparathyroidism of renal origin: Secondary | ICD-10-CM | POA: Diagnosis not present

## 2016-10-04 DIAGNOSIS — R509 Fever, unspecified: Secondary | ICD-10-CM | POA: Diagnosis not present

## 2016-10-04 DIAGNOSIS — D631 Anemia in chronic kidney disease: Secondary | ICD-10-CM | POA: Diagnosis not present

## 2016-10-04 DIAGNOSIS — D509 Iron deficiency anemia, unspecified: Secondary | ICD-10-CM | POA: Diagnosis not present

## 2016-10-05 ENCOUNTER — Other Ambulatory Visit: Payer: Self-pay | Admitting: Internal Medicine

## 2016-10-07 ENCOUNTER — Telehealth: Payer: Self-pay

## 2016-10-07 ENCOUNTER — Telehealth: Payer: Self-pay | Admitting: Internal Medicine

## 2016-10-07 DIAGNOSIS — E1129 Type 2 diabetes mellitus with other diabetic kidney complication: Secondary | ICD-10-CM | POA: Diagnosis not present

## 2016-10-07 DIAGNOSIS — N2581 Secondary hyperparathyroidism of renal origin: Secondary | ICD-10-CM | POA: Diagnosis not present

## 2016-10-07 DIAGNOSIS — R509 Fever, unspecified: Secondary | ICD-10-CM | POA: Diagnosis not present

## 2016-10-07 DIAGNOSIS — D509 Iron deficiency anemia, unspecified: Secondary | ICD-10-CM | POA: Diagnosis not present

## 2016-10-07 DIAGNOSIS — D631 Anemia in chronic kidney disease: Secondary | ICD-10-CM | POA: Diagnosis not present

## 2016-10-07 DIAGNOSIS — N186 End stage renal disease: Secondary | ICD-10-CM | POA: Diagnosis not present

## 2016-10-07 NOTE — Telephone Encounter (Signed)
Called and advised patient we had switched to the [place], patient understood, had no questions.

## 2016-10-07 NOTE — Telephone Encounter (Signed)
**  Remind patient they can make refill requests via MyChart**  Medication refill request (Name & Dosage):  LANTUS  Preferred pharmacy (Name & Address):  [facility] - [place], [place] [phone] (Phone) [phone] (Fax)    Other comments (if applicable):   Call patient to advise. Patient stated there are no refills left, did not see medication in epic.

## 2016-10-07 NOTE — Telephone Encounter (Signed)
Called and advised patient we had switched to the [place], patient understood, had no questions.

## 2016-10-08 DIAGNOSIS — E113412 Type 2 diabetes mellitus with severe nonproliferative diabetic retinopathy with macular edema, left eye: Secondary | ICD-10-CM | POA: Diagnosis not present

## 2016-10-08 LAB — HM DIABETES EYE EXAM

## 2016-10-09 DIAGNOSIS — D509 Iron deficiency anemia, unspecified: Secondary | ICD-10-CM | POA: Diagnosis not present

## 2016-10-09 DIAGNOSIS — R509 Fever, unspecified: Secondary | ICD-10-CM | POA: Diagnosis not present

## 2016-10-09 DIAGNOSIS — N2581 Secondary hyperparathyroidism of renal origin: Secondary | ICD-10-CM | POA: Diagnosis not present

## 2016-10-09 DIAGNOSIS — N186 End stage renal disease: Secondary | ICD-10-CM | POA: Diagnosis not present

## 2016-10-09 DIAGNOSIS — D631 Anemia in chronic kidney disease: Secondary | ICD-10-CM | POA: Diagnosis not present

## 2016-10-09 DIAGNOSIS — E1129 Type 2 diabetes mellitus with other diabetic kidney complication: Secondary | ICD-10-CM | POA: Diagnosis not present

## 2016-10-10 ENCOUNTER — Ambulatory Visit (HOSPITAL_COMMUNITY)
Admission: RE | Admit: 2016-10-10 | Discharge: 2016-10-10 | Disposition: A | Discharge status: Home / Self Care | Payer: Medicare Other | Source: Ambulatory Visit | Attending: Cardiology | Admitting: Cardiology

## 2016-10-10 DIAGNOSIS — L97519 Non-pressure chronic ulcer of other part of right foot with unspecified severity: Secondary | ICD-10-CM | POA: Diagnosis not present

## 2016-10-10 DIAGNOSIS — I70292 Other atherosclerosis of native arteries of extremities, left leg: Secondary | ICD-10-CM | POA: Diagnosis not present

## 2016-10-10 DIAGNOSIS — E11621 Type 2 diabetes mellitus with foot ulcer: Secondary | ICD-10-CM | POA: Insufficient documentation

## 2016-10-10 DIAGNOSIS — I70201 Unspecified atherosclerosis of native arteries of extremities, right leg: Secondary | ICD-10-CM | POA: Diagnosis not present

## 2016-10-11 DIAGNOSIS — N186 End stage renal disease: Secondary | ICD-10-CM | POA: Diagnosis not present

## 2016-10-11 DIAGNOSIS — N2581 Secondary hyperparathyroidism of renal origin: Secondary | ICD-10-CM | POA: Diagnosis not present

## 2016-10-11 DIAGNOSIS — E1129 Type 2 diabetes mellitus with other diabetic kidney complication: Secondary | ICD-10-CM | POA: Diagnosis not present

## 2016-10-11 DIAGNOSIS — R509 Fever, unspecified: Secondary | ICD-10-CM | POA: Diagnosis not present

## 2016-10-11 DIAGNOSIS — D509 Iron deficiency anemia, unspecified: Secondary | ICD-10-CM | POA: Diagnosis not present

## 2016-10-11 DIAGNOSIS — D631 Anemia in chronic kidney disease: Secondary | ICD-10-CM | POA: Diagnosis not present

## 2016-10-12 DIAGNOSIS — I129 Hypertensive chronic kidney disease with stage 1 through stage 4 chronic kidney disease, or unspecified chronic kidney disease: Secondary | ICD-10-CM | POA: Diagnosis not present

## 2016-10-12 DIAGNOSIS — N186 End stage renal disease: Secondary | ICD-10-CM | POA: Diagnosis not present

## 2016-10-12 DIAGNOSIS — Z992 Dependence on renal dialysis: Secondary | ICD-10-CM | POA: Diagnosis not present

## 2016-10-14 DIAGNOSIS — N2581 Secondary hyperparathyroidism of renal origin: Secondary | ICD-10-CM | POA: Diagnosis not present

## 2016-10-14 DIAGNOSIS — D509 Iron deficiency anemia, unspecified: Secondary | ICD-10-CM | POA: Diagnosis not present

## 2016-10-14 DIAGNOSIS — E1129 Type 2 diabetes mellitus with other diabetic kidney complication: Secondary | ICD-10-CM | POA: Diagnosis not present

## 2016-10-14 DIAGNOSIS — N186 End stage renal disease: Secondary | ICD-10-CM | POA: Diagnosis not present

## 2016-10-14 DIAGNOSIS — D631 Anemia in chronic kidney disease: Secondary | ICD-10-CM | POA: Diagnosis not present

## 2016-10-16 DIAGNOSIS — N186 End stage renal disease: Secondary | ICD-10-CM | POA: Diagnosis not present

## 2016-10-16 DIAGNOSIS — D631 Anemia in chronic kidney disease: Secondary | ICD-10-CM | POA: Diagnosis not present

## 2016-10-16 DIAGNOSIS — N2581 Secondary hyperparathyroidism of renal origin: Secondary | ICD-10-CM | POA: Diagnosis not present

## 2016-10-16 DIAGNOSIS — D509 Iron deficiency anemia, unspecified: Secondary | ICD-10-CM | POA: Diagnosis not present

## 2016-10-16 DIAGNOSIS — E1129 Type 2 diabetes mellitus with other diabetic kidney complication: Secondary | ICD-10-CM | POA: Diagnosis not present

## 2016-10-18 DIAGNOSIS — N2581 Secondary hyperparathyroidism of renal origin: Secondary | ICD-10-CM | POA: Diagnosis not present

## 2016-10-18 DIAGNOSIS — D631 Anemia in chronic kidney disease: Secondary | ICD-10-CM | POA: Diagnosis not present

## 2016-10-18 DIAGNOSIS — N186 End stage renal disease: Secondary | ICD-10-CM | POA: Diagnosis not present

## 2016-10-18 DIAGNOSIS — D509 Iron deficiency anemia, unspecified: Secondary | ICD-10-CM | POA: Diagnosis not present

## 2016-10-18 DIAGNOSIS — E1129 Type 2 diabetes mellitus with other diabetic kidney complication: Secondary | ICD-10-CM | POA: Diagnosis not present

## 2016-10-20 ENCOUNTER — Other Ambulatory Visit: Payer: Self-pay | Admitting: Internal Medicine

## 2016-10-20 DIAGNOSIS — F4322 Adjustment disorder with anxiety: Secondary | ICD-10-CM

## 2016-10-21 DIAGNOSIS — E1129 Type 2 diabetes mellitus with other diabetic kidney complication: Secondary | ICD-10-CM | POA: Diagnosis not present

## 2016-10-21 DIAGNOSIS — D631 Anemia in chronic kidney disease: Secondary | ICD-10-CM | POA: Diagnosis not present

## 2016-10-21 DIAGNOSIS — N186 End stage renal disease: Secondary | ICD-10-CM | POA: Diagnosis not present

## 2016-10-21 DIAGNOSIS — D509 Iron deficiency anemia, unspecified: Secondary | ICD-10-CM | POA: Diagnosis not present

## 2016-10-21 DIAGNOSIS — N2581 Secondary hyperparathyroidism of renal origin: Secondary | ICD-10-CM | POA: Diagnosis not present

## 2016-10-22 ENCOUNTER — Encounter: Payer: Self-pay | Admitting: Cardiovascular Disease

## 2016-10-22 ENCOUNTER — Ambulatory Visit (INDEPENDENT_AMBULATORY_CARE_PROVIDER_SITE_OTHER): Payer: Medicare Other | Admitting: Cardiovascular Disease

## 2016-10-22 VITALS — BP 118/48 | HR 60 | Ht 70.5 in | Wt 224.8 lb

## 2016-10-22 DIAGNOSIS — I2510 Atherosclerotic heart disease of native coronary artery without angina pectoris: Secondary | ICD-10-CM

## 2016-10-22 DIAGNOSIS — Z9861 Coronary angioplasty status: Secondary | ICD-10-CM

## 2016-10-22 DIAGNOSIS — N185 Chronic kidney disease, stage 5: Secondary | ICD-10-CM | POA: Diagnosis not present

## 2016-10-22 DIAGNOSIS — I10 Essential (primary) hypertension: Secondary | ICD-10-CM | POA: Diagnosis not present

## 2016-10-22 DIAGNOSIS — I739 Peripheral vascular disease, unspecified: Secondary | ICD-10-CM | POA: Diagnosis not present

## 2016-10-22 DIAGNOSIS — I5022 Chronic systolic (congestive) heart failure: Secondary | ICD-10-CM

## 2016-10-22 NOTE — Progress Notes (Signed)
Cardiology Office Note   Date:  10/22/2016   ID:  Brooke Stafford, DOB [birth year], MRN [id number]  PCP:  Arnoldo Morale, MD  Cardiologist: Dr. Sallyanne Kuster Nephrologist: Dr. Jimmy Footman   Chief Complaint  Patient presents with  . New Patient (Initial Visit)    PV consult//Per Dr. Sallyanne Kuster      History of Present Illness: Brooke Stafford is a 61 y.o. female who Was referred for evaluation and management of peripheral arterial disease. She has known history of coronary artery disease with previous CABG and stent placement, chronic systolic and diastolic heart failure, insulin-requiring diabetes mellitus, hypertension, hyperlipidemia and end-stage renal disease on hemodialysis on Monday Wednesday and Friday. She is known to have peripheral arterial disease with previous left SFA stent placement in 2012 and 2014.  She developed right great toe ulceration in April which has not healed since then. She is being followed by Dr. Cannon Kettle in podiatry. She has no significant leg claudication. She underwent recent lower symptoms get Doppler which showed an ABI of 0.6 on the right side with evidence of short occlusion of the popliteal artery.The left SFA stent was noted to be occluded.   Past Medical History:  Diagnosis Date  . Anemia   . Anginal pain ([place])   . Anxiety   . Arthritis    "stiff fingers and knees" (08/04/2013), (12/12/2014)  . Asthma   . CAD (coronary artery disease) 2002; 2015   CABG x 6 2002, cath 2011- med Rx stent DES VG-Diag  . CAD (coronary artery disease) of artery bypass graft; DES to VG-Diag 09/28/13 11/09/2013  . Cataract   . CHF (congestive heart failure) ([place])    "in 2002" (11/26/2012)  . Chronic bronchitis ([place])    "q year; in the winter"   . CKD (chronic kidney disease)    stage 4, followed by [facility]  . Coronary artery disease 2002   CABG x 6. Cath 5/11- med Rx  . Diabetic neuropathy ([place])   . GERD (gastroesophageal reflux disease)   . Gout    "right big  toe"  . Headache    "~ q week" (08/04/2013); "~ twice/month" (12/12/2014)  . History of blood transfusion 2002   "when I had OHS"  . Hyperlipidemia   . Hypertension   . Hypothyroid    treated  . Migraines    "couple times/year" (08/04/2013), (12/12/2014)  . Myocardial infarction ([place]) 2000; 2002; 2011  . Obesity (BMI 35.0-39.9 without comorbidity)   . Peripheral vascular disease ([place]) 12/12   LSFA PTA  . Pneumonia    "3 times I think" (12/12/2014)  . Stroke [facility])    " mini stroke"  . Type II diabetes mellitus ([place])     Past Surgical History:  Procedure Laterality Date  . ABDOMINAL AORTAGRAM N/A 04/05/2011   Procedure: ABDOMINAL AORTAGRAM;  Surgeon: Lorretta Harp, MD;  Location: [facility] CATH LAB;  Service: Cardiovascular;  Laterality: N/A;  . APPENDECTOMY  1980  . AV FISTULA PLACEMENT Left 05/10/2016   Procedure: LEFT ARM ARTERIOVENOUS (AV) FISTULA CREATION;  Surgeon: Rosetta Posner, MD;  Location: [place];  Service: Vascular;  Laterality: Left;  . BASCILIC VEIN TRANSPOSITION Left 06/26/2016   Procedure: SECOND STAGE BASILIC VEIN TRANSPOSITION;  Surgeon: Rosetta Posner, MD;  Location: [place];  Service: Vascular;  Laterality: Left;  . BREAST CYST EXCISION Right 1970's  . CARDIAC CATHETERIZATION  2002  . CARDIAC CATHETERIZATION N/A 12/12/2014   Procedure: Left Heart Cath and Cors/Grafts Angiography;  Surgeon: Peter M Martinique, MD;  Location: [place] CV LAB;  Service: Cardiovascular;  Laterality: N/A;  . CARDIAC CATHETERIZATION N/A 12/12/2014   Procedure: Coronary Stent Intervention;  Surgeon: Peter M Martinique, MD;  Location: [place] CV LAB;  Service: Cardiovascular;  Laterality: N/A;  . CATARACT EXTRACTION, BILATERAL    . CESAREAN SECTION  1978; 1980  . CHOLECYSTECTOMY  1982  . CORONARY ANGIOPLASTY WITH STENT PLACEMENT  2004; 2012   "I have 2 stents" (08/04/2013)  . CORONARY ANGIOPLASTY WITH STENT PLACEMENT  09/28/13   PTCA/ DES Xience stent to VG-Diag   . CORONARY ARTERY BYPASS GRAFT   11/20/2000   x6 LIMA to distal LAD, svg to first diag, svg to ramus intermediate branch and swquential SVG to cir marginal branch, SVG to posterior descending coronary and sequential SVG to first right posterolateral branch  . eye injections    . INSERTION OF DIALYSIS CATHETER Right 05/10/2016   Procedure: INSERTION OF DIALYSIS CATHETER - Right Internal Jugular Placement;  Surgeon: Rosetta Posner, MD;  Location: [place];  Service: Vascular;  Laterality: Right;  . LEFT HEART CATHETERIZATION WITH CORONARY/GRAFT ANGIOGRAM N/A 09/28/2013   Procedure: LEFT HEART CATHETERIZATION WITH Beatrix Fetters;  Surgeon: Peter M Martinique, MD;  Location: [facility] CATH LAB;  Service: Cardiovascular;  Laterality: N/A;  . LOWER EXTREMITY ANGIOGRAM  12/01/2012   Procedure: LOWER EXTREMITY ANGIOGRAM;  Surgeon: Lorretta Harp, MD;  Location[facility] CATH LAB;  Service: Cardiovascular;;  . NM MYOCAR PERF WALL MOTION  08/27/2004   negative  . PERCUTANEOUS STENT INTERVENTION Left 12/01/2012   Procedure: PERCUTANEOUS STENT INTERVENTION;  Surgeon: Lorretta Harp, MD;  Location: [facility] CATH LAB;  Service: Cardiovascular;  Laterality: Left;  Left SFA  . PERIPHERAL ARTERIAL STENT GRAFT Left    SFA/notes 04/07/2011 (11/30/2012)  . RENAL ANGIOGRAM N/A 04/05/2011   Procedure: RENAL ANGIOGRAM;  Surgeon: Lorretta Harp, MD;  Location: [facility] CATH LAB;  Service: Cardiovascular;  Laterality: N/A;  . TUBAL LIGATION  1980     Current Outpatient Prescriptions  Medication Sig Dispense Refill  . acetaminophen-codeine (TYLENOL #3) 300-30 MG tablet Take 1 tablet by mouth 2 (two) times daily as needed for moderate pain.    [name] albuterol (PROVENTIL HFA;VENTOLIN HFA) 108 (90 Base) MCG/ACT inhaler Inhale 2 puffs into the lungs every 6 (six) hours as needed for wheezing or shortness of breath. 3 Inhaler 3  . albuterol (PROVENTIL) (2.5 MG/3ML) 0.083% nebulizer solution Take 3 mLs (2.5 mg total) by nebulization every 6 (six) hours as needed for wheezing or  shortness of breath. 150 mL 3  . allopurinol (ZYLOPRIM) 300 MG tablet Take 1 tablet (300 mg total) by mouth daily. 90 tablet 3  . aspirin 81 MG chewable tablet Chew 1 tablet (81 mg total) by mouth daily. 30 tablet 10  . atorvastatin (LIPITOR) 40 MG tablet Take 1 tablet (40 mg total) by mouth every evening. 30 tablet 3  . cadexomer iodine (IODOSORB) 0.9 % gel Apply 1 application topically daily as needed for wound care. 40 g 0  . citalopram (CELEXA) 20 MG tablet Take 1 tablet (20 mg total) by mouth daily. 30 tablet 3  . clopidogrel (PLAVIX) 75 MG tablet take 1 tablet by mouth once daily 90 tablet 3  . ezetimibe (ZETIA) 10 MG tablet Take 1 tablet (10 mg total) by mouth daily. 90 tablet 3  . ferric gluconate 125 mg in sodium chloride 0.9 % 100 mL Inject 125 mg into the vein Every Tuesday,Thursday,and Saturday  with dialysis.    . fluticasone (FLONASE) 50 MCG/ACT nasal spray Place 2 sprays into both nostrils daily as needed for allergies or rhinitis. 16 g 3  . gabapentin (NEURONTIN) 400 MG capsule Take 400 mg by mouth daily.     . hydrOXYzine (ATARAX/VISTARIL) 25 MG tablet take 1 tablet by mouth three times a day 90 tablet 0  . insulin aspart (NOVOLOG) 100 UNIT/ML injection Inject 25-30 Units into the skin 3 (three) times daily before meals. Pens 15 mL 5  . Insulin Glargine (BASAGLAR KWIKPEN) 100 UNIT/ML SOPN INJECT 25 UNITS INTO THE SKIN AT BEDTIME. 9 mL 0  . isosorbide mononitrate (IMDUR) 60 MG 24 hr tablet Take 60 mg by mouth daily.    [name] levothyroxine (SYNTHROID, LEVOTHROID) 25 MCG tablet take 66mcg by mouth every morning BEFORE BREAKFAST 30 tablet 2  . lidocaine-prilocaine (EMLA) cream Apply 1 application topically as directed.  1  . loratadine (CLARITIN) 10 MG tablet Take 10 mg by mouth daily.    . metoprolol tartrate (LOPRESSOR) 25 MG tablet Take 0.5 tablets (12.5 mg total) by mouth 2 (two) times daily. 90 tablet 3  . Naphazoline HCl (CLEAR EYES OP) Place 1 drop into both eyes as needed (for dry  eyes).    . nitroGLYCERIN (NITROSTAT) 0.4 MG SL tablet Place 1 tablet (0.4 mg total) under the tongue every 5 (five) minutes as needed. (Patient taking differently: Place 0.4 mg under the tongue every 5 (five) minutes as needed for chest pain. ) 30 tablet 2  . prednisoLONE acetate (PRED FORTE) 1 % ophthalmic suspension Place 1 drop into both eyes 2 (two) times daily.   1  . ranitidine (ZANTAC 75) 75 MG tablet Take 1 tablet (75 mg total) by mouth daily as needed for heartburn.    [name] RENVELA 800 MG tablet Take 2,400 mg by mouth 3 (three) times daily with meals.     . senna-docusate (SENOKOT-S) 8.6-50 MG tablet Take 1 tablet by mouth at bedtime as needed for mild constipation. 30 tablet 0  . sevelamer (RENAGEL) 800 MG tablet Take 2,400 mg by mouth 3 (three) times daily with meals.    . traMADol (ULTRAM) 50 MG tablet Take 1 tablet (50 mg total) by mouth every 12 (twelve) hours as needed. 30 tablet 0  . triamcinolone ointment (KENALOG) 0.5 % Apply 1 application topically daily.     No current facility-administered medications for this visit.     Allergies:   Digoxin and related; Hydralazine; Penicillins cross reactors; Lisinopril; and Adhesive [tape]    Social History:  The patient  reports that she quit smoking about 16 years ago. Her smoking use included Cigarettes. She smoked 0.00 packs per day for 25.00 years. She has never used smokeless tobacco. She reports that she does not drink alcohol or use drugs.   Family History:  The patient's family history includes Diabetes in her mother and sister; Hyperlipidemia in her sister; Hypertension in her brother, father, mother, and sister; Stroke in her mother.    ROS:  Please see the history of present illness.   Otherwise, review of systems are positive for none.   All other systems are reviewed and negative.    PHYSICAL EXAM: VS:  BP (!) 118/48   Pulse 60   Ht 5' 10.5" (1.791 m)   Wt 224 lb 12.8 oz (102 kg)   LMP 10/13/2014 (Exact Date)   BMI  31.80 kg/m  , BMI Body mass index is 31.8 kg/m. GEN: Well nourished,  well developed, in no acute distress  HEENT: normal  Neck: no JVD, carotid bruits, or masses Cardiac: RRR; no murmurs, rubs, or gallops,no edema  Respiratory:  clear to auscultation bilaterally, normal work of breathing GI: soft, nontender, nondistended, + BS MS: no deformity or atrophy  Skin: warm and dry, no rash Neuro:  Strength and sensation are intact Psych: euthymic mood, full affect Vascular: Femoral pulses normal but deep. Distal pulses are not palpable. There is 1 x 1 cm ulceration at the tip of the right big toe with dry necrotic tissue.   EKG:  EKG is not ordered today.    Recent Labs: 05/03/2016: B Natriuretic Peptide 2,093.8 05/06/2016: Magnesium 2.2 05/07/2016: ALT 38 05/14/2016: BUN 67; Creatinine, Ser 6.02; Platelets 286 06/26/2016: Hemoglobin 11.9; Potassium 4.1; Sodium 137    Lipid Panel    Component Value Date/Time   CHOL 142 07/25/2015 2037   CHOL 144 04/20/2015 1036   TRIG 163 (H) 05/07/2016 0450   HDL 39 (L) 07/25/2015 2037   HDL 51 04/20/2015 1036   CHOLHDL 3.6 07/25/2015 2037   VLDL 44 (H) 07/25/2015 2037   LDLCALC 59 07/25/2015 2037   LDLCALC 63 04/20/2015 1036      Wt Readings from Last 3 Encounters:  10/22/16 224 lb 12.8 oz (102 kg)  08/27/16 219 lb (99.3 kg)  08/20/16 218 lb (98.9 kg)      No flowsheet data found.    ASSESSMENT AND PLAN:  1.  Peripheral arterial disease with nonhealing ulceration on the right big toe: ABI was moderately reduced with evidence of popliteal artery occlusion. Without revascularization, I think there is minimal chance of healing. I recommend receding with urgent abdominal aortogram with lower extremity runoff and possible endovascular intervention. I discussed the procedure in details as well as risks and benefits. She is already on dual antiplatelet therapy.  2. End-stage renal disease on hemodialysis: We will likely need to admit her to  the hospital for dialysis that day.  3. Chronic systolic/diastolic heart failure: She appears to be euvolemic.  4. Coronary artery disease involving native coronary arteries without angina: Continue medical therapy.     Disposition:   FU with me in 1 month  Signed,  Kathlyn Sacramento, MD  10/22/2016 11:04 AM    [place]

## 2016-10-22 NOTE — Patient Instructions (Signed)
Medication Instructions:  Your physician recommends that you continue on your current medications as directed. Please refer to the Current Medication list given to you today.  Labwork: Your physician recommends that you have lab work today: BMP, CBC and PT/INR  Testing/Procedures: Your physician has requested that you have a peripheral vascular angiogram. This exam is performed at the hospital. During this exam IV contrast is used to look at arterial blood flow. Please review the information sheet given for details.    [facility] [address] [place] [address] Dept: [phone] Loc: [phone]  ILSE BILLMAN  10/22/2016  You are scheduled for a Peripheral Angiogram on Wednesday, July 18 with Dr. Kathlyn Sacramento.  1. Please arrive at the [facility] (Main Entrance A) at [facility]: [address] [place], [place] [address] at 10:30 AM (two hours before your procedure to ensure your preparation). Free valet parking service is available.   Special note: Every effort is made to have your procedure done on time. Please understand that emergencies sometimes delay scheduled procedures.  2. Diet: Do not eat or drink anything after midnight prior to your procedure except sips of water to take medications.  3. Labs: None needed.  4. Medication instructions in preparation for your procedure:  Take only half of your normal dosage/ units of insulin the night before your procedure. Do not take any insulin on the day of the procedure.  On the morning of your procedure, take your Aspirin and Plavix/Clopidogrel and any morning medicines NOT listed above.  You may use sips of water.  5. Plan for one night stay--bring personal belongings.  6. Bring a current list of your medications and current insurance cards.  7. You MUST have a responsible person to drive you home.  8. Someone MUST be  with you the first 24 hours after you arrive home or your discharge will be delayed.  9. Please wear clothes that are easy to get on and off and wear slip-on shoes.  Thank you for allowing [place] to care for you!   -- [facility]  Follow-Up: Your physician recommends that you schedule a follow-up appointment in: 1 MONTH with Dr Fletcher Anon   Any Other Special Instructions Will Be Listed Below (If Applicable).     If you need a refill on your cardiac medications before your next appointment, please call your pharmacy.

## 2016-10-23 DIAGNOSIS — D631 Anemia in chronic kidney disease: Secondary | ICD-10-CM | POA: Diagnosis not present

## 2016-10-23 DIAGNOSIS — N2581 Secondary hyperparathyroidism of renal origin: Secondary | ICD-10-CM | POA: Diagnosis not present

## 2016-10-23 DIAGNOSIS — E1129 Type 2 diabetes mellitus with other diabetic kidney complication: Secondary | ICD-10-CM | POA: Diagnosis not present

## 2016-10-23 DIAGNOSIS — N186 End stage renal disease: Secondary | ICD-10-CM | POA: Diagnosis not present

## 2016-10-23 DIAGNOSIS — E039 Hypothyroidism, unspecified: Secondary | ICD-10-CM | POA: Diagnosis not present

## 2016-10-23 DIAGNOSIS — D509 Iron deficiency anemia, unspecified: Secondary | ICD-10-CM | POA: Diagnosis not present

## 2016-10-23 LAB — CBC
Hematocrit: 35.2 % (ref 34.0–46.6)
Hemoglobin: 11.5 g/dL (ref 11.1–15.9)
MCH: 33 pg (ref 26.6–33.0)
MCHC: 32.7 g/dL (ref 31.5–35.7)
MCV: 101 fL — AB (ref 79–97)
PLATELETS: 182 10*3/uL (ref 150–379)
RBC: 3.49 x10E6/uL — ABNORMAL LOW (ref 3.77–5.28)
RDW: 14.9 % (ref 12.3–15.4)
WBC: 4.3 10*3/uL (ref 3.4–10.8)

## 2016-10-23 LAB — BASIC METABOLIC PANEL
BUN / CREAT RATIO: 6 — AB (ref 12–28)
BUN: 36 mg/dL — ABNORMAL HIGH (ref 8–27)
CO2: 28 mmol/L (ref 20–29)
CREATININE: 6.38 mg/dL — AB (ref 0.57–1.00)
Calcium: 10.5 mg/dL — ABNORMAL HIGH (ref 8.7–10.3)
Chloride: 93 mmol/L — ABNORMAL LOW (ref 96–106)
GFR, EST AFRICAN AMERICAN: 7 mL/min/{1.73_m2} — AB (ref >59)
GFR, EST NON AFRICAN AMERICAN: 6 mL/min/{1.73_m2} — AB (ref >59)
Glucose: 174 mg/dL — ABNORMAL HIGH (ref 65–99)
Potassium: 4.5 mmol/L (ref 3.5–5.2)
SODIUM: 139 mmol/L (ref 134–144)

## 2016-10-23 LAB — PROTIME-INR
INR: 0.9 (ref 0.8–1.2)
Prothrombin Time: 10 s (ref 9.1–12.0)

## 2016-10-24 DIAGNOSIS — I871 Compression of vein: Secondary | ICD-10-CM | POA: Diagnosis not present

## 2016-10-24 DIAGNOSIS — Z992 Dependence on renal dialysis: Secondary | ICD-10-CM | POA: Diagnosis not present

## 2016-10-24 DIAGNOSIS — T82868A Thrombosis of vascular prosthetic devices, implants and grafts, initial encounter: Secondary | ICD-10-CM | POA: Diagnosis not present

## 2016-10-24 DIAGNOSIS — N186 End stage renal disease: Secondary | ICD-10-CM | POA: Diagnosis not present

## 2016-10-25 DIAGNOSIS — N186 End stage renal disease: Secondary | ICD-10-CM | POA: Diagnosis not present

## 2016-10-25 DIAGNOSIS — D631 Anemia in chronic kidney disease: Secondary | ICD-10-CM | POA: Diagnosis not present

## 2016-10-25 DIAGNOSIS — D509 Iron deficiency anemia, unspecified: Secondary | ICD-10-CM | POA: Diagnosis not present

## 2016-10-25 DIAGNOSIS — E1129 Type 2 diabetes mellitus with other diabetic kidney complication: Secondary | ICD-10-CM | POA: Diagnosis not present

## 2016-10-25 DIAGNOSIS — N2581 Secondary hyperparathyroidism of renal origin: Secondary | ICD-10-CM | POA: Diagnosis not present

## 2016-10-28 DIAGNOSIS — D509 Iron deficiency anemia, unspecified: Secondary | ICD-10-CM | POA: Diagnosis not present

## 2016-10-28 DIAGNOSIS — E1129 Type 2 diabetes mellitus with other diabetic kidney complication: Secondary | ICD-10-CM | POA: Diagnosis not present

## 2016-10-28 DIAGNOSIS — N2581 Secondary hyperparathyroidism of renal origin: Secondary | ICD-10-CM | POA: Diagnosis not present

## 2016-10-28 DIAGNOSIS — D631 Anemia in chronic kidney disease: Secondary | ICD-10-CM | POA: Diagnosis not present

## 2016-10-28 DIAGNOSIS — N186 End stage renal disease: Secondary | ICD-10-CM | POA: Diagnosis not present

## 2016-10-29 ENCOUNTER — Other Ambulatory Visit: Payer: Self-pay | Admitting: Vascular Surgery

## 2016-10-30 ENCOUNTER — Encounter (HOSPITAL_COMMUNITY)
Admission: RE | Disposition: A | Discharge status: Home / Self Care | Payer: Self-pay | Source: Ambulatory Visit | Attending: Cardiovascular Disease

## 2016-10-30 ENCOUNTER — Observation Stay (HOSPITAL_COMMUNITY)
Admission: RE | Admit: 2016-10-30 | Discharge: 2016-10-31 | Disposition: A | Discharge status: Home / Self Care | Payer: Medicare Other | Source: Ambulatory Visit | Attending: Cardiovascular Disease | Admitting: Cardiovascular Disease

## 2016-10-30 DIAGNOSIS — E1122 Type 2 diabetes mellitus with diabetic chronic kidney disease: Secondary | ICD-10-CM | POA: Diagnosis not present

## 2016-10-30 DIAGNOSIS — M1990 Unspecified osteoarthritis, unspecified site: Secondary | ICD-10-CM | POA: Diagnosis not present

## 2016-10-30 DIAGNOSIS — D631 Anemia in chronic kidney disease: Secondary | ICD-10-CM | POA: Insufficient documentation

## 2016-10-30 DIAGNOSIS — N186 End stage renal disease: Secondary | ICD-10-CM | POA: Insufficient documentation

## 2016-10-30 DIAGNOSIS — E11621 Type 2 diabetes mellitus with foot ulcer: Secondary | ICD-10-CM | POA: Insufficient documentation

## 2016-10-30 DIAGNOSIS — I5042 Chronic combined systolic (congestive) and diastolic (congestive) heart failure: Secondary | ICD-10-CM | POA: Diagnosis not present

## 2016-10-30 DIAGNOSIS — J42 Unspecified chronic bronchitis: Secondary | ICD-10-CM | POA: Insufficient documentation

## 2016-10-30 DIAGNOSIS — K219 Gastro-esophageal reflux disease without esophagitis: Secondary | ICD-10-CM | POA: Diagnosis not present

## 2016-10-30 DIAGNOSIS — M109 Gout, unspecified: Secondary | ICD-10-CM | POA: Diagnosis not present

## 2016-10-30 DIAGNOSIS — Z87891 Personal history of nicotine dependence: Secondary | ICD-10-CM | POA: Insufficient documentation

## 2016-10-30 DIAGNOSIS — L97519 Non-pressure chronic ulcer of other part of right foot with unspecified severity: Secondary | ICD-10-CM | POA: Diagnosis not present

## 2016-10-30 DIAGNOSIS — I998 Other disorder of circulatory system: Secondary | ICD-10-CM | POA: Diagnosis present

## 2016-10-30 DIAGNOSIS — Z7982 Long term (current) use of aspirin: Secondary | ICD-10-CM | POA: Insufficient documentation

## 2016-10-30 DIAGNOSIS — F419 Anxiety disorder, unspecified: Secondary | ICD-10-CM | POA: Insufficient documentation

## 2016-10-30 DIAGNOSIS — E669 Obesity, unspecified: Secondary | ICD-10-CM | POA: Diagnosis not present

## 2016-10-30 DIAGNOSIS — E1140 Type 2 diabetes mellitus with diabetic neuropathy, unspecified: Secondary | ICD-10-CM | POA: Diagnosis not present

## 2016-10-30 DIAGNOSIS — Z8249 Family history of ischemic heart disease and other diseases of the circulatory system: Secondary | ICD-10-CM | POA: Insufficient documentation

## 2016-10-30 DIAGNOSIS — I132 Hypertensive heart and chronic kidney disease with heart failure and with stage 5 chronic kidney disease, or end stage renal disease: Secondary | ICD-10-CM | POA: Diagnosis not present

## 2016-10-30 DIAGNOSIS — E039 Hypothyroidism, unspecified: Secondary | ICD-10-CM | POA: Insufficient documentation

## 2016-10-30 DIAGNOSIS — I2510 Atherosclerotic heart disease of native coronary artery without angina pectoris: Secondary | ICD-10-CM | POA: Insufficient documentation

## 2016-10-30 DIAGNOSIS — I70235 Atherosclerosis of native arteries of right leg with ulceration of other part of foot: Secondary | ICD-10-CM | POA: Insufficient documentation

## 2016-10-30 DIAGNOSIS — Z951 Presence of aortocoronary bypass graft: Secondary | ICD-10-CM | POA: Insufficient documentation

## 2016-10-30 DIAGNOSIS — I70229 Atherosclerosis of native arteries of extremities with rest pain, unspecified extremity: Secondary | ICD-10-CM | POA: Diagnosis present

## 2016-10-30 DIAGNOSIS — G43909 Migraine, unspecified, not intractable, without status migrainosus: Secondary | ICD-10-CM | POA: Diagnosis not present

## 2016-10-30 DIAGNOSIS — Z7902 Long term (current) use of antithrombotics/antiplatelets: Secondary | ICD-10-CM | POA: Insufficient documentation

## 2016-10-30 DIAGNOSIS — Z794 Long term (current) use of insulin: Secondary | ICD-10-CM | POA: Insufficient documentation

## 2016-10-30 DIAGNOSIS — E785 Hyperlipidemia, unspecified: Secondary | ICD-10-CM | POA: Insufficient documentation

## 2016-10-30 DIAGNOSIS — Z880 Allergy status to penicillin: Secondary | ICD-10-CM | POA: Insufficient documentation

## 2016-10-30 DIAGNOSIS — Z6831 Body mass index (BMI) 31.0-31.9, adult: Secondary | ICD-10-CM | POA: Diagnosis not present

## 2016-10-30 DIAGNOSIS — I252 Old myocardial infarction: Secondary | ICD-10-CM | POA: Diagnosis not present

## 2016-10-30 DIAGNOSIS — E1151 Type 2 diabetes mellitus with diabetic peripheral angiopathy without gangrene: Principal | ICD-10-CM | POA: Insufficient documentation

## 2016-10-30 DIAGNOSIS — Z8673 Personal history of transient ischemic attack (TIA), and cerebral infarction without residual deficits: Secondary | ICD-10-CM | POA: Insufficient documentation

## 2016-10-30 DIAGNOSIS — I739 Peripheral vascular disease, unspecified: Secondary | ICD-10-CM

## 2016-10-30 DIAGNOSIS — Z992 Dependence on renal dialysis: Secondary | ICD-10-CM | POA: Insufficient documentation

## 2016-10-30 DIAGNOSIS — Z7951 Long term (current) use of inhaled steroids: Secondary | ICD-10-CM | POA: Insufficient documentation

## 2016-10-30 DIAGNOSIS — I70211 Atherosclerosis of native arteries of extremities with intermittent claudication, right leg: Secondary | ICD-10-CM | POA: Diagnosis not present

## 2016-10-30 HISTORY — PX: PERIPHERAL VASCULAR ATHERECTOMY: CATH118256

## 2016-10-30 HISTORY — PX: LOWER EXTREMITY ANGIOGRAPHY: CATH118251

## 2016-10-30 HISTORY — PX: ABDOMINAL AORTOGRAM: CATH118222

## 2016-10-30 HISTORY — PX: PERIPHERAL VASCULAR INTERVENTION: CATH118257

## 2016-10-30 LAB — POCT ACTIVATED CLOTTING TIME
ACTIVATED CLOTTING TIME: 219 s
ACTIVATED CLOTTING TIME: 180 s
Activated Clotting Time: 224 seconds
Activated Clotting Time: 230 seconds
Activated Clotting Time: 241 seconds

## 2016-10-30 LAB — GLUCOSE, CAPILLARY
GLUCOSE-CAPILLARY: 119 mg/dL — AB (ref 65–99)
GLUCOSE-CAPILLARY: 247 mg/dL — AB (ref 65–99)

## 2016-10-30 SURGERY — ABDOMINAL AORTOGRAM
Anesthesia: LOCAL | Laterality: Right

## 2016-10-30 MED ORDER — HEPARIN SODIUM (PORCINE) 1000 UNIT/ML IJ SOLN
INTRAMUSCULAR | Status: DC | PRN
  Administered 2016-10-30 (×2): 2000 [IU] via INTRAVENOUS
  Administered 2016-10-30: 8000 [IU] via INTRAVENOUS

## 2016-10-30 MED ORDER — SEVELAMER CARBONATE 800 MG PO TABS
800.0000 mg | ORAL_TABLET | Freq: Three times a day (TID) | ORAL | Status: DC
  Administered 2016-10-31: 800 mg via ORAL
  Filled 2016-10-30: qty 1

## 2016-10-30 MED ORDER — ACETAMINOPHEN-CODEINE #3 300-30 MG PO TABS
1.0000 | ORAL_TABLET | Freq: Once | ORAL | Status: AC
  Administered 2016-10-30: 1 via ORAL
  Filled 2016-10-30: qty 1

## 2016-10-30 MED ORDER — HEPARIN (PORCINE) IN NACL 2-0.9 UNIT/ML-% IJ SOLN
INTRAMUSCULAR | Status: AC | PRN
  Administered 2016-10-30: 1000 mL

## 2016-10-30 MED ORDER — SODIUM CHLORIDE 0.9 % IV SOLN: 250.0000 mL | INTRAVENOUS | Status: DC | PRN

## 2016-10-30 MED ORDER — GABAPENTIN 400 MG PO CAPS
400.0000 mg | ORAL_CAPSULE | Freq: Every day | ORAL | Status: DC
  Administered 2016-10-30: 400 mg via ORAL
  Filled 2016-10-30: qty 1

## 2016-10-30 MED ORDER — METOPROLOL TARTRATE 12.5 MG HALF TABLET
12.5000 mg | ORAL_TABLET | Freq: Two times a day (BID) | ORAL | Status: DC
  Administered 2016-10-30 – 2016-10-31 (×2): 12.5 mg via ORAL
  Filled 2016-10-30 (×2): qty 1

## 2016-10-30 MED ORDER — IODIXANOL 320 MG/ML IV SOLN
INTRAVENOUS | Status: DC | PRN
  Administered 2016-10-30: 170 mL via INTRAVENOUS

## 2016-10-30 MED ORDER — HEPARIN (PORCINE) IN NACL 2-0.9 UNIT/ML-% IJ SOLN
INTRAMUSCULAR | Status: AC
  Filled 2016-10-30: qty 1000

## 2016-10-30 MED ORDER — MIDAZOLAM HCL 2 MG/2ML IJ SOLN
INTRAMUSCULAR | Status: DC | PRN
  Administered 2016-10-30: 1 mg via INTRAVENOUS

## 2016-10-30 MED ORDER — FLUTICASONE PROPIONATE 50 MCG/ACT NA SUSP: 2.0000 | Freq: Every day | NASAL | Status: DC | PRN

## 2016-10-30 MED ORDER — ATORVASTATIN CALCIUM 40 MG PO TABS
40.0000 mg | ORAL_TABLET | Freq: Every evening | ORAL | Status: DC
  Administered 2016-10-30: 40 mg via ORAL
  Filled 2016-10-30: qty 1

## 2016-10-30 MED ORDER — PREDNISOLONE ACETATE 1 % OP SUSP
1.0000 [drp] | Freq: Two times a day (BID) | OPHTHALMIC | Status: DC
  Filled 2016-10-30: qty 1

## 2016-10-30 MED ORDER — FENTANYL CITRATE (PF) 100 MCG/2ML IJ SOLN
INTRAMUSCULAR | Status: AC
  Filled 2016-10-30: qty 2

## 2016-10-30 MED ORDER — ONDANSETRON HCL 4 MG/2ML IJ SOLN: 4.0000 mg | Freq: Four times a day (QID) | INTRAMUSCULAR | Status: DC | PRN

## 2016-10-30 MED ORDER — SENNOSIDES-DOCUSATE SODIUM 8.6-50 MG PO TABS: 1.0000 | ORAL_TABLET | Freq: Every evening | ORAL | Status: DC | PRN

## 2016-10-30 MED ORDER — LIDOCAINE HCL (PF) 1 % IJ SOLN
INTRAMUSCULAR | Status: AC
  Filled 2016-10-30: qty 30

## 2016-10-30 MED ORDER — ALBUTEROL SULFATE HFA 108 (90 BASE) MCG/ACT IN AERS: 2.0000 | INHALATION_SPRAY | Freq: Four times a day (QID) | RESPIRATORY_TRACT | Status: DC | PRN

## 2016-10-30 MED ORDER — IOPAMIDOL (ISOVUE-370) INJECTION 76%
INTRAVENOUS | Status: AC
  Filled 2016-10-30: qty 100

## 2016-10-30 MED ORDER — SODIUM CHLORIDE 0.9 % IV SOLN: INTRAVENOUS | Status: DC

## 2016-10-30 MED ORDER — EZETIMIBE 10 MG PO TABS: 10.0000 mg | ORAL_TABLET | Freq: Every day | ORAL | Status: DC

## 2016-10-30 MED ORDER — CITALOPRAM HYDROBROMIDE 20 MG PO TABS
20.0000 mg | ORAL_TABLET | Freq: Every day | ORAL | Status: DC
  Administered 2016-10-30: 20 mg via ORAL
  Filled 2016-10-30: qty 1

## 2016-10-30 MED ORDER — SODIUM CHLORIDE 0.9% FLUSH
3.0000 mL | Freq: Two times a day (BID) | INTRAVENOUS | Status: DC
  Administered 2016-10-30 – 2016-10-31 (×2): 3 mL via INTRAVENOUS

## 2016-10-30 MED ORDER — ISOSORBIDE MONONITRATE ER 30 MG PO TB24
30.0000 mg | ORAL_TABLET | Freq: Two times a day (BID) | ORAL | Status: DC
  Administered 2016-10-30 – 2016-10-31 (×2): 30 mg via ORAL
  Filled 2016-10-30 (×2): qty 1

## 2016-10-30 MED ORDER — FAMOTIDINE 20 MG PO TABS: 20.0000 mg | ORAL_TABLET | Freq: Two times a day (BID) | ORAL | Status: DC | PRN

## 2016-10-30 MED ORDER — ACETAMINOPHEN-CODEINE #3 300-30 MG PO TABS
1.0000 | ORAL_TABLET | Freq: Two times a day (BID) | ORAL | Status: DC | PRN
  Administered 2016-10-30 – 2016-10-31 (×2): 1 via ORAL
  Filled 2016-10-30 (×2): qty 1

## 2016-10-30 MED ORDER — NITROGLYCERIN 0.4 MG SL SUBL: 0.4000 mg | SUBLINGUAL_TABLET | SUBLINGUAL | Status: DC | PRN

## 2016-10-30 MED ORDER — LEVOTHYROXINE SODIUM 25 MCG PO TABS
25.0000 ug | ORAL_TABLET | Freq: Every day | ORAL | Status: DC
  Administered 2016-10-31: 25 ug via ORAL
  Filled 2016-10-30: qty 1

## 2016-10-30 MED ORDER — HEPARIN SODIUM (PORCINE) 1000 UNIT/ML IJ SOLN
INTRAMUSCULAR | Status: AC
  Filled 2016-10-30: qty 1

## 2016-10-30 MED ORDER — INSULIN ASPART 100 UNIT/ML ~~LOC~~ SOLN: 25.0000 [IU] | Freq: Three times a day (TID) | SUBCUTANEOUS | Status: DC

## 2016-10-30 MED ORDER — LIDOCAINE HCL (PF) 1 % IJ SOLN
INTRAMUSCULAR | Status: DC | PRN
  Administered 2016-10-30: 18 mL

## 2016-10-30 MED ORDER — ASPIRIN 81 MG PO CHEW
81.0000 mg | CHEWABLE_TABLET | Freq: Every day | ORAL | Status: DC
  Administered 2016-10-31: 81 mg via ORAL
  Filled 2016-10-30: qty 1

## 2016-10-30 MED ORDER — LORATADINE 10 MG PO TABS
10.0000 mg | ORAL_TABLET | Freq: Every day | ORAL | Status: DC
  Administered 2016-10-30 – 2016-10-31 (×2): 10 mg via ORAL
  Filled 2016-10-30 (×2): qty 1

## 2016-10-30 MED ORDER — CLOPIDOGREL BISULFATE 75 MG PO TABS
75.0000 mg | ORAL_TABLET | Freq: Every day | ORAL | Status: DC
  Administered 2016-10-30 – 2016-10-31 (×2): 75 mg via ORAL
  Filled 2016-10-30 (×2): qty 1

## 2016-10-30 MED ORDER — SODIUM CHLORIDE 0.9% FLUSH: 3.0000 mL | INTRAVENOUS | Status: DC | PRN

## 2016-10-30 MED ORDER — ACETAMINOPHEN-CODEINE #3 300-30 MG PO TABS
1.0000 | ORAL_TABLET | Freq: Two times a day (BID) | ORAL | Status: DC | PRN
Start: 2016-10-30 — End: 2016-10-30

## 2016-10-30 MED ORDER — MIDAZOLAM HCL 2 MG/2ML IJ SOLN
INTRAMUSCULAR | Status: AC
  Filled 2016-10-30: qty 2

## 2016-10-30 MED ORDER — LIDOCAINE-PRILOCAINE 2.5-2.5 % EX CREA: 1.0000 "application " | TOPICAL_CREAM | CUTANEOUS | Status: DC

## 2016-10-30 MED ORDER — HYDROXYZINE HCL 25 MG PO TABS
25.0000 mg | ORAL_TABLET | Freq: Three times a day (TID) | ORAL | Status: DC
  Administered 2016-10-30 – 2016-10-31 (×2): 25 mg via ORAL
  Filled 2016-10-30 (×2): qty 1

## 2016-10-30 MED ORDER — SODIUM CHLORIDE 0.9% FLUSH: 3.0000 mL | Freq: Two times a day (BID) | INTRAVENOUS | Status: DC

## 2016-10-30 MED ORDER — ACETAMINOPHEN 325 MG PO TABS
650.0000 mg | ORAL_TABLET | ORAL | Status: DC | PRN
  Administered 2016-10-31: 650 mg via ORAL

## 2016-10-30 MED ORDER — FENTANYL CITRATE (PF) 100 MCG/2ML IJ SOLN
INTRAMUSCULAR | Status: DC | PRN
  Administered 2016-10-30: 50 ug via INTRAVENOUS
  Administered 2016-10-30 (×2): 25 ug via INTRAVENOUS

## 2016-10-30 MED ORDER — ALBUTEROL SULFATE (2.5 MG/3ML) 0.083% IN NEBU: 2.5000 mg | INHALATION_SOLUTION | Freq: Four times a day (QID) | RESPIRATORY_TRACT | Status: DC | PRN

## 2016-10-30 MED ORDER — ASPIRIN 81 MG PO CHEW: 81.0000 mg | CHEWABLE_TABLET | ORAL | Status: DC

## 2016-10-30 MED ORDER — ALLOPURINOL 300 MG PO TABS
300.0000 mg | ORAL_TABLET | Freq: Every day | ORAL | Status: DC
  Administered 2016-10-31: 300 mg via ORAL
  Filled 2016-10-30: qty 1

## 2016-10-30 MED ORDER — SODIUM CHLORIDE 0.9 % IV SOLN
125.0000 mg | INTRAVENOUS | Status: DC
  Filled 2016-10-30 (×2): qty 10

## 2016-10-30 SURGICAL SUPPLY — 32 items
BALLN IN.PACT DCB 5X40 (BALLOONS) ×3
BALLN IN.PACT DCB 6X120 (BALLOONS) ×3
BALLN IN.PACT DCB 6X150 (BALLOONS) ×3
BALLN MUSTANG 6X60X135 (BALLOONS) ×3
BALLN STERLING OTW 5X220X150 (BALLOONS) ×3
BALLOON MUSTANG 6X60X135 (BALLOONS) ×2 IMPLANT
BALLOON STERLING OTW 5X220X150 (BALLOONS) ×2 IMPLANT
CATH ANGIO 5F PIGTAIL 65CM (CATHETERS) ×3 IMPLANT
CATH CROSS OVER TEMPO 5F (CATHETERS) ×3 IMPLANT
CATH NAVICROSS ST .035X135CM (MICROCATHETER) ×3 IMPLANT
CATH STRAIGHT 5FR 65CM (CATHETERS) ×3 IMPLANT
DCB IN.PACT 5X40 (BALLOONS) ×2 IMPLANT
DCB IN.PACT 6X120 (BALLOONS) ×2 IMPLANT
DCB IN.PACT 6X150 (BALLOONS) ×2 IMPLANT
DEVICE CONTINUOUS FLUSH (MISCELLANEOUS) ×3 IMPLANT
GUIDEWIRE ANGLED .035X260CM (WIRE) ×3 IMPLANT
KIT ENCORE 26 ADVANTAGE (KITS) ×3 IMPLANT
KIT MICROINTRODUCER STIFF 5F (SHEATH) ×3 IMPLANT
KIT PV (KITS) ×3 IMPLANT
SHEATH PINNACLE 5F 10CM (SHEATH) ×3 IMPLANT
SHEATH PINNACLE ST 6F 45CM (SHEATH) ×3 IMPLANT
SHIELD RADPAD SCOOP 12X17 (MISCELLANEOUS) ×3 IMPLANT
STENT INNOVA 6X80X130 (Permanent Stent) ×3 IMPLANT
STENT INNOVA 7X100X130 (Permanent Stent) ×3 IMPLANT
STOPCOCK MORSE 400PSI 3WAY (MISCELLANEOUS) ×3 IMPLANT
SYRINGE MEDRAD AVANTA MACH 7 (SYRINGE) ×3 IMPLANT
TAPE RADIOPAQUE TURBO (MISCELLANEOUS) ×3 IMPLANT
TRANSDUCER W/STOPCOCK (MISCELLANEOUS) ×3 IMPLANT
TRAY PV CATH (CUSTOM PROCEDURE TRAY) ×3 IMPLANT
TUBING CIL FLEX 10 FLL-RA (TUBING) ×3 IMPLANT
WIRE G V18X300CM (WIRE) ×3 IMPLANT
WIRE HITORQ VERSACORE ST 145CM (WIRE) ×3 IMPLANT

## 2016-10-30 NOTE — Interval H&P Note (Signed)
History and Physical Interval Note:  10/30/2016 1:39 PM  Brooke Stafford  has presented today for surgery, with the diagnosis of pad  The various methods of treatment have been discussed with the patient and family. After consideration of risks, benefits and other options for treatment, the patient has consented to  Procedure(s): Abdominal Aortogram w/Lower Extremity (N/A) as a surgical intervention .  The patient's history has been reviewed, patient examined, no change in status, stable for surgery.  I have reviewed the patient's chart and labs.  Questions were answered to the patient's satisfaction.     Kathlyn Sacramento

## 2016-10-30 NOTE — Progress Notes (Signed)
Site area: Left groin a 6 french long arterial sheath was removed  Site Prior to Removal:  Level 0  Pressure Applied For 15 MINUTES    Bedrest Beginning at 1730p  Manual:   Yes.    Patient Status During Pull:  stable  Post Pull Groin Site:  Level 0  Post Pull Instructions Given:  Yes.    Post Pull Pulses Present:  Yes.    Dressing Applied:  Yes.    Comments:  VS remain stable during sheath pull

## 2016-10-30 NOTE — H&P (View-Only) (Signed)
Cardiology Office Note   Date:  10/22/2016   ID:  Brooke Stafford, DOB 12/28/1955, MRN [id number]  PCP:  Arnoldo Morale, MD  Cardiologist: Dr. Sallyanne Kuster Nephrologist: Dr. Jimmy Footman   Chief Complaint  Patient presents with  . New Patient (Initial Visit)    PV consult//Per Dr. Sallyanne Kuster      History of Present Illness: Brooke Stafford is a 61 y.o. female who Was referred for evaluation and management of peripheral arterial disease. She has known history of coronary artery disease with previous CABG and stent placement, chronic systolic and diastolic heart failure, insulin-requiring diabetes mellitus, hypertension, hyperlipidemia and end-stage renal disease on hemodialysis on Monday Wednesday and Friday. She is known to have peripheral arterial disease with previous left SFA stent placement in 2012 and 2014.  She developed right great toe ulceration in April which has not healed since then. She is being followed by Dr. Cannon Kettle in podiatry. She has no significant leg claudication. She underwent recent lower symptoms get Doppler which showed an ABI of 0.6 on the right side with evidence of short occlusion of the popliteal artery.The left SFA stent was noted to be occluded.   Past Medical History:  Diagnosis Date  . Anemia   . Anginal pain ([place])   . Anxiety   . Arthritis    "stiff fingers and knees" (08/04/2013), (12/12/2014)  . Asthma   . CAD (coronary artery disease) 2002; 2015   CABG x 6 2002, cath 2011- med Rx stent DES VG-Diag  . CAD (coronary artery disease) of artery bypass graft; DES to VG-Diag 09/28/13 11/09/2013  . Cataract   . CHF (congestive heart failure) ([place])    "in 2002" (11/26/2012)  . Chronic bronchitis ([place])    "q year; in the winter"   . CKD (chronic kidney disease)    stage 4, followed by [facility]  . Coronary artery disease 2002   CABG x 6. Cath 5/11- med Rx  . Diabetic neuropathy ([place])   . GERD (gastroesophageal reflux disease)   . Gout    "right big  toe"  . Headache    "~ q week" (08/04/2013); "~ twice/month" (12/12/2014)  . History of blood transfusion 2002   "when I had OHS"  . Hyperlipidemia   . Hypertension   . Hypothyroid    treated  . Migraines    "couple times/year" (08/04/2013), (12/12/2014)  . Myocardial infarction ([place]) 2000; 2002; 2011  . Obesity (BMI 35.0-39.9 without comorbidity)   . Peripheral vascular disease ([place]) 12/12   LSFA PTA  . Pneumonia    "3 times I think" (12/12/2014)  . Stroke [facility])    " mini stroke"  . Type II diabetes mellitus ([place])     Past Surgical History:  Procedure Laterality Date  . ABDOMINAL AORTAGRAM N/A 04/05/2011   Procedure: ABDOMINAL AORTAGRAM;  Surgeon: Lorretta Harp, MD;  Location[facility] CATH LAB;  Service: Cardiovascular;  Laterality: N/A;  . APPENDECTOMY  1980  . AV FISTULA PLACEMENT Left 05/10/2016   Procedure: LEFT ARM ARTERIOVENOUS (AV) FISTULA CREATION;  Surgeon: Rosetta Posner, MD;  Location: [place];  Service: Vascular;  Laterality: Left;  . BASCILIC VEIN TRANSPOSITION Left 06/26/2016   Procedure: SECOND STAGE BASILIC VEIN TRANSPOSITION;  Surgeon: Rosetta Posner, MD;  Location: [place];  Service: Vascular;  Laterality: Left;  . BREAST CYST EXCISION Right 1970's  . CARDIAC CATHETERIZATION  2002  . CARDIAC CATHETERIZATION N/A 12/12/2014   Procedure: Left Heart Cath and Cors/Grafts Angiography;  Surgeon: Peter M Martinique, MD;  Location: [place] CV LAB;  Service: Cardiovascular;  Laterality: N/A;  . CARDIAC CATHETERIZATION N/A 12/12/2014   Procedure: Coronary Stent Intervention;  Surgeon: Peter M Martinique, MD;  Location: [place] CV LAB;  Service: Cardiovascular;  Laterality: N/A;  . CATARACT EXTRACTION, BILATERAL    . CESAREAN SECTION  1978; 1980  . CHOLECYSTECTOMY  1982  . CORONARY ANGIOPLASTY WITH STENT PLACEMENT  2004; 2012   "I have 2 stents" (08/04/2013)  . CORONARY ANGIOPLASTY WITH STENT PLACEMENT  09/28/13   PTCA/ DES Xience stent to VG-Diag   . CORONARY ARTERY BYPASS GRAFT   11/20/2000   x6 LIMA to distal LAD, svg to first diag, svg to ramus intermediate branch and swquential SVG to cir marginal branch, SVG to posterior descending coronary and sequential SVG to first right posterolateral branch  . eye injections    . INSERTION OF DIALYSIS CATHETER Right 05/10/2016   Procedure: INSERTION OF DIALYSIS CATHETER - Right Internal Jugular Placement;  Surgeon: Rosetta Posner, MD;  Location: [place];  Service: Vascular;  Laterality: Right;  . LEFT HEART CATHETERIZATION WITH CORONARY/GRAFT ANGIOGRAM N/A 09/28/2013   Procedure: LEFT HEART CATHETERIZATION WITH Beatrix Fetters;  Surgeon: Peter M Martinique, MD;  Location: [facility] C[facility] LAB;  Service: Cardiovascular;  Laterality: N/A;  . LOWER EXTREMITY ANGIOGRAM  12/01/2012   Procedure: LOWER EXTREMITY ANGIOGRAM;  Surgeon: Lorretta Harp, MD;  Location: [facility] CATH LAB;  Service: Cardiovascular;;  . NM MYOCAR PERF WALL MOTION  08/27/2004   negative  . PERCUTANEOUS STENT INTERVENTION Left 12/01/2012   Procedure: PERCUTANEOUS STENT INTERVENTION;  Surgeon: Lorretta Harp, MD;  Location: [facility] CATH LAB;  Service: Cardiovascular;  Laterality: Left;  Left SFA  . PERIPHERAL ARTERIAL STENT GRAFT Left    SFA/notes 04/07/2011 (11/30/2012)  . RENAL ANGIOGRAM N/A 04/05/2011   Procedure: RENAL ANGIOGRAM;  Surgeon: Lorretta Harp, MD;  Location: [facility] CATH LAB;  Service: Cardiovascular;  Laterality: N/A;  . TUBAL LIGATION  1980     Current Outpatient Prescriptions  Medication Sig Dispense Refill  . acetaminophen-codeine (TYLENOL #3) 300-30 MG tablet Take 1 tablet by mouth 2 (two) times daily as needed for moderate pain.    [name] albuterol (PROVENTIL HFA;VENTOLIN HFA) 108 (90 Base) MCG/ACT inhaler Inhale 2 puffs into the lungs every 6 (six) hours as needed for wheezing or shortness of breath. 3 Inhaler 3  . albuterol (PROVENTIL) (2.5 MG/3ML) 0.083% nebulizer solution Take 3 mLs (2.5 mg total) by nebulization every 6 (six) hours as needed for wheezing or  shortness of breath. 150 mL 3  . allopurinol (ZYLOPRIM) 300 MG tablet Take 1 tablet (300 mg total) by mouth daily. 90 tablet 3  . aspirin 81 MG chewable tablet Chew 1 tablet (81 mg total) by mouth daily. 30 tablet 10  . atorvastatin (LIPITOR) 40 MG tablet Take 1 tablet (40 mg total) by mouth every evening. 30 tablet 3  . cadexomer iodine (IODOSORB) 0.9 % gel Apply 1 application topically daily as needed for wound care. 40 g 0  . citalopram (CELEXA) 20 MG tablet Take 1 tablet (20 mg total) by mouth daily. 30 tablet 3  . clopidogrel (PLAVIX) 75 MG tablet take 1 tablet by mouth once daily 90 tablet 3  . ezetimibe (ZETIA) 10 MG tablet Take 1 tablet (10 mg total) by mouth daily. 90 tablet 3  . ferric gluconate 125 mg in sodium chloride 0.9 % 100 mL Inject 125 mg into the vein Every Tuesday,Thursday,and Saturday  with dialysis.    . fluticasone (FLONASE) 50 MCG/ACT nasal spray Place 2 sprays into both nostrils daily as needed for allergies or rhinitis. 16 g 3  . gabapentin (NEURONTIN) 400 MG capsule Take 400 mg by mouth daily.     . hydrOXYzine (ATARAX/VISTARIL) 25 MG tablet take 1 tablet by mouth three times a day 90 tablet 0  . insulin aspart (NOVOLOG) 100 UNIT/ML injection Inject 25-30 Units into the skin 3 (three) times daily before meals. Pens 15 mL 5  . Insulin Glargine (BASAGLAR KWIKPEN) 100 UNIT/ML SOPN INJECT 25 UNITS INTO THE SKIN AT BEDTIME. 9 mL 0  . isosorbide mononitrate (IMDUR) 60 MG 24 hr tablet Take 60 mg by mouth daily.    [name] levothyroxine (SYNTHROID, LEVOTHROID) 25 MCG tablet take 60mcg by mouth every morning BEFORE BREAKFAST 30 tablet 2  . lidocaine-prilocaine (EMLA) cream Apply 1 application topically as directed.  1  . loratadine (CLARITIN) 10 MG tablet Take 10 mg by mouth daily.    . metoprolol tartrate (LOPRESSOR) 25 MG tablet Take 0.5 tablets (12.5 mg total) by mouth 2 (two) times daily. 90 tablet 3  . Naphazoline HCl (CLEAR EYES OP) Place 1 drop into both eyes as needed (for dry  eyes).    . nitroGLYCERIN (NITROSTAT) 0.4 MG SL tablet Place 1 tablet (0.4 mg total) under the tongue every 5 (five) minutes as needed. (Patient taking differently: Place 0.4 mg under the tongue every 5 (five) minutes as needed for chest pain. ) 30 tablet 2  . prednisoLONE acetate (PRED FORTE) 1 % ophthalmic suspension Place 1 drop into both eyes 2 (two) times daily.   1  . ranitidine (ZANTAC 75) 75 MG tablet Take 1 tablet (75 mg total) by mouth daily as needed for heartburn.    [name] RENVELA 800 MG tablet Take 2,400 mg by mouth 3 (three) times daily with meals.     . senna-docusate (SENOKOT-S) 8.6-50 MG tablet Take 1 tablet by mouth at bedtime as needed for mild constipation. 30 tablet 0  . sevelamer (RENAGEL) 800 MG tablet Take 2,400 mg by mouth 3 (three) times daily with meals.    . traMADol (ULTRAM) 50 MG tablet Take 1 tablet (50 mg total) by mouth every 12 (twelve) hours as needed. 30 tablet 0  . triamcinolone ointment (KENALOG) 0.5 % Apply 1 application topically daily.     No current facility-administered medications for this visit.     Allergies:   Digoxin and related; Hydralazine; Penicillins cross reactors; Lisinopril; and Adhesive [tape]    Social History:  The patient  reports that she quit smoking about 16 years ago. Her smoking use included Cigarettes. She smoked 0.00 packs per day for 25.00 years. She has never used smokeless tobacco. She reports that she does not drink alcohol or use drugs.   Family History:  The patient's family history includes Diabetes in her mother and sister; Hyperlipidemia in her sister; Hypertension in her brother, father, mother, and sister; Stroke in her mother.    ROS:  Please see the history of present illness.   Otherwise, review of systems are positive for none.   All other systems are reviewed and negative.    PHYSICAL EXAM: VS:  BP (!) 118/48   Pulse 60   Ht 5' 10.5" (1.791 m)   Wt 224 lb 12.8 oz (102 kg)   LMP 10/13/2014 (Exact Date)   BMI  31.80 kg/m  , BMI Body mass index is 31.8 kg/m. GEN: Well nourished,  well developed, in no acute distress  HEENT: normal  Neck: no JVD, carotid bruits, or masses Cardiac: RRR; no murmurs, rubs, or gallops,no edema  Respiratory:  clear to auscultation bilaterally, normal work of breathing GI: soft, nontender, nondistended, + BS MS: no deformity or atrophy  Skin: warm and dry, no rash Neuro:  Strength and sensation are intact Psych: euthymic mood, full affect Vascular: Femoral pulses normal but deep. Distal pulses are not palpable. There is 1 x 1 cm ulceration at the tip of the right big toe with dry necrotic tissue.   EKG:  EKG is not ordered today.    Recent Labs: 05/03/2016: B Natriuretic Peptide 2,093.8 05/06/2016: Magnesium 2.2 05/07/2016: ALT 38 05/14/2016: BUN 67; Creatinine, Ser 6.02; Platelets 286 06/26/2016: Hemoglobin 11.9; Potassium 4.1; Sodium 137    Lipid Panel    Component Value Date/Time   CHOL 142 07/25/2015 2037   CHOL 144 04/20/2015 1036   TRIG 163 (H) 05/07/2016 0450   HDL 39 (L) 07/25/2015 2037   HDL 51 04/20/2015 1036   CHOLHDL 3.6 07/25/2015 2037   VLDL 44 (H) 07/25/2015 2037   LDLCALC 59 07/25/2015 2037   LDLCALC 63 04/20/2015 1036      Wt Readings from Last 3 Encounters:  10/22/16 224 lb 12.8 oz (102 kg)  08/27/16 219 lb (99.3 kg)  08/20/16 218 lb (98.9 kg)      No flowsheet data found.    ASSESSMENT AND PLAN:  1.  Peripheral arterial disease with nonhealing ulceration on the right big toe: ABI was moderately reduced with evidence of popliteal artery occlusion. Without revascularization, I think there is minimal chance of healing. I recommend receding with urgent abdominal aortogram with lower extremity runoff and possible endovascular intervention. I discussed the procedure in details as well as risks and benefits. She is already on dual antiplatelet therapy.  2. End-stage renal disease on hemodialysis: We will likely need to admit her to  the hospital for dialysis that day.  3. Chronic systolic/diastolic heart failure: She appears to be euvolemic.  4. Coronary artery disease involving native coronary arteries without angina: Continue medical therapy.     Disposition:   FU with me in 1 month  Signed,  Kathlyn Sacramento, MD  10/22/2016 11:04 AM    [place]

## 2016-10-31 ENCOUNTER — Encounter (HOSPITAL_COMMUNITY): Payer: Self-pay

## 2016-10-31 ENCOUNTER — Other Ambulatory Visit: Payer: Self-pay | Admitting: Physician Assistant

## 2016-10-31 DIAGNOSIS — I2510 Atherosclerotic heart disease of native coronary artery without angina pectoris: Secondary | ICD-10-CM | POA: Diagnosis not present

## 2016-10-31 DIAGNOSIS — Z951 Presence of aortocoronary bypass graft: Secondary | ICD-10-CM | POA: Diagnosis not present

## 2016-10-31 DIAGNOSIS — I739 Peripheral vascular disease, unspecified: Secondary | ICD-10-CM

## 2016-10-31 DIAGNOSIS — I998 Other disorder of circulatory system: Secondary | ICD-10-CM | POA: Diagnosis not present

## 2016-10-31 DIAGNOSIS — N186 End stage renal disease: Secondary | ICD-10-CM | POA: Diagnosis not present

## 2016-10-31 DIAGNOSIS — I120 Hypertensive chronic kidney disease with stage 5 chronic kidney disease or end stage renal disease: Secondary | ICD-10-CM | POA: Diagnosis not present

## 2016-10-31 DIAGNOSIS — E11621 Type 2 diabetes mellitus with foot ulcer: Secondary | ICD-10-CM | POA: Diagnosis not present

## 2016-10-31 DIAGNOSIS — L97519 Non-pressure chronic ulcer of other part of right foot with unspecified severity: Secondary | ICD-10-CM | POA: Diagnosis not present

## 2016-10-31 DIAGNOSIS — N2581 Secondary hyperparathyroidism of renal origin: Secondary | ICD-10-CM | POA: Diagnosis not present

## 2016-10-31 DIAGNOSIS — I70235 Atherosclerosis of native arteries of right leg with ulceration of other part of foot: Secondary | ICD-10-CM | POA: Diagnosis not present

## 2016-10-31 DIAGNOSIS — Z992 Dependence on renal dialysis: Secondary | ICD-10-CM | POA: Diagnosis not present

## 2016-10-31 DIAGNOSIS — E1151 Type 2 diabetes mellitus with diabetic peripheral angiopathy without gangrene: Secondary | ICD-10-CM | POA: Diagnosis not present

## 2016-10-31 DIAGNOSIS — D631 Anemia in chronic kidney disease: Secondary | ICD-10-CM | POA: Diagnosis not present

## 2016-10-31 LAB — CBC
HCT: 30.9 % — ABNORMAL LOW (ref 36.0–46.0)
Hemoglobin: 10.1 g/dL — ABNORMAL LOW (ref 12.0–15.0)
MCH: 32.9 pg (ref 26.0–34.0)
MCHC: 32.7 g/dL (ref 30.0–36.0)
MCV: 100.7 fL — ABNORMAL HIGH (ref 78.0–100.0)
PLATELETS: 169 10*3/uL (ref 150–400)
RBC: 3.07 MIL/uL — ABNORMAL LOW (ref 3.87–5.11)
RDW: 13.7 % (ref 11.5–15.5)
WBC: 5.5 10*3/uL (ref 4.0–10.5)

## 2016-10-31 LAB — BASIC METABOLIC PANEL
Anion gap: 13 (ref 5–15)
BUN: 52 mg/dL — AB (ref 6–20)
CALCIUM: 9.2 mg/dL (ref 8.9–10.3)
CO2: 26 mmol/L (ref 22–32)
CREATININE: 8.81 mg/dL — AB (ref 0.44–1.00)
Chloride: 98 mmol/L — ABNORMAL LOW (ref 101–111)
GFR calc Af Amer: 5 mL/min — ABNORMAL LOW (ref >60)
GFR, EST NON AFRICAN AMERICAN: 4 mL/min — AB (ref >60)
Glucose, Bld: 192 mg/dL — ABNORMAL HIGH (ref 65–99)
POTASSIUM: 4.3 mmol/L (ref 3.5–5.1)
SODIUM: 137 mmol/L (ref 135–145)

## 2016-10-31 LAB — HEPATITIS B SURFACE ANTIGEN: HEP B S AG: NEGATIVE

## 2016-10-31 LAB — GLUCOSE, CAPILLARY: GLUCOSE-CAPILLARY: 177 mg/dL — AB (ref 65–99)

## 2016-10-31 MED ORDER — SODIUM CHLORIDE 0.9 % IV SOLN: 100.0000 mL | INTRAVENOUS | Status: DC | PRN

## 2016-10-31 MED ORDER — LIDOCAINE HCL (PF) 1 % IJ SOLN: 5.0000 mL | INTRAMUSCULAR | Status: DC | PRN

## 2016-10-31 MED ORDER — INSULIN ASPART 100 UNIT/ML ~~LOC~~ SOLN
0.0000 [IU] | Freq: Three times a day (TID) | SUBCUTANEOUS | Status: DC
  Administered 2016-10-31: 4 [IU] via SUBCUTANEOUS

## 2016-10-31 MED ORDER — HEPARIN SODIUM (PORCINE) 1000 UNIT/ML DIALYSIS: 1000.0000 [IU] | INTRAMUSCULAR | Status: DC | PRN

## 2016-10-31 MED ORDER — ALTEPLASE 2 MG IJ SOLR: 2.0000 mg | Freq: Once | INTRAMUSCULAR | Status: DC | PRN

## 2016-10-31 MED ORDER — HEPARIN SODIUM (PORCINE) 1000 UNIT/ML DIALYSIS: 20.0000 [IU]/kg | INTRAMUSCULAR | Status: DC | PRN

## 2016-10-31 MED ORDER — ACETAMINOPHEN 325 MG PO TABS
ORAL_TABLET | ORAL | Status: AC
  Filled 2016-10-31: qty 2

## 2016-10-31 MED ORDER — LIDOCAINE-PRILOCAINE 2.5-2.5 % EX CREA: 1.0000 "application " | TOPICAL_CREAM | CUTANEOUS | Status: DC | PRN

## 2016-10-31 MED ORDER — PENTAFLUOROPROP-TETRAFLUOROETH EX AERO: 1.0000 "application " | INHALATION_SPRAY | CUTANEOUS | Status: DC | PRN

## 2016-10-31 NOTE — Progress Notes (Signed)
Pt along with room mate at bedside given discharge instructions & information on diet, site care, f/u appointments, meds, & etc. All questions were  Answered & information was verified  Via the teach back method. All belongings were gathered & sent with pt home. Hoover Brunette, RN

## 2016-10-31 NOTE — Care Management CC44 (Signed)
Condition Code 44 Documentation Completed  Patient Details  Name: Brooke Stafford MRN: [id number] Date of Birth: [birth year]   Condition Code 44 given:  Yes Patient signature on Condition Code 44 notice:  Yes Documentation of 2 MD's agreement:  Yes Code 44 added to claim:  Yes    Bethena Roys, RN 10/31/2016, 2:18 PM

## 2016-10-31 NOTE — Procedures (Signed)
Patient seen and examined on Hemodialysis. QB 400 L AVF UF goal 2.5L.  Tolerating well.  Treatment adjusted as needed.  Madelon Lips MD [facility] pgr [phone] 8:51 AM

## 2016-10-31 NOTE — Discharge Summary (Signed)
Discharge Summary    Patient ID: Brooke Stafford,  MRN: [id number], DOB/AGE: [birth year] 61 y.o.  Admit date: 10/30/2016 Discharge date: 10/31/2016  Primary Care Provider: Arnoldo Morale Primary Cardiologist: Dr. Fletcher Anon   Discharge Diagnoses    Active Problems:   Critical lower limb ischemia   PAD   DM ESRD  HTN  CAD s/p CABG  Chronic combined CHF  Allergies Allergies  Allergen Reactions  . Digoxin And Related Diarrhea and Other (See Comments)    TOXIC DRUG LEVELS Patient stated she almost died. Had flu like symptoms as well as diarrhea.  . Hydralazine Shortness Of Breath  . Penicillins Cross Reactors Hives and Other (See Comments)    HIGH FEVER  Has patient had a PCN reaction causing immediate rash, facial/tongue/throat swelling, SOB or lightheadedness with hypotension: No Has patient had a PCN reaction causing SEVERE RASH INVOLVING MUCUS MEMBRANES or SKIN NECROSIS  #  #  #  YES  #  #  #  Has patient had a PCN reaction that required hospitalization: Unk Has patient had a PCN reaction occurring within the last 10 years: Unk If all of the above answers are "NO", then may proceed with Cephalosporin use.   [name] Lisinopril Other (See Comments)    Felt like she had the flu. Was very sick!!!  . Adhesive [Tape] Rash and Other (See Comments)    bruising    Diagnostic Studies/Procedures    Abdominal Aortogram  Lower Extremity Angiography  Peripheral Vascular Atherectomy  Peripheral Vascular Intervention  Conclusion   1. No significant aortoiliac disease. 2. Diffuse proximal and midright SFA disease with short occlusion distally, short proximal right popliteal artery stenosis and three-vessel runoff below the knee. 3. Successful drug-coated balloon angioplasty and 2 stent placement to both the popliteal artery and the SFA as outlined above.  Recommendations: Continue dual antiplatelet therapy. Admit the patient for dialysis and blood pressure control. Possible discharge  home tomorrow if no complications.      History of Present Illness     61 y.o. female coronary artery disease with previous CABG and stent placement, chronic systolic and diastolic heart failure, insulin-requiring diabetes mellitus, PAD, hypertension, hyperlipidemia and end-stage renal disease on hemodialysis  (MWF) presented for PV angiogram.   She is known to have peripheral arterial disease with previous left SFA stent placement in 2012 and 2014.  She developed right great toe ulceration in April which has not healed since then. She is being followed by Dr. Cannon Kettle in podiatry. She has no significant leg claudication. She underwent recent lower symptoms get Doppler which showed an ABI of 0.6 on the right side with evidence of short occlusion of the popliteal artery.The left SFA stent was noted to be occluded.  Hospital Course     Consultants: Renal     1. PAD - PV angiogram showed Diffuse proximal and midright SFA disease with short occlusion distally, short proximal right popliteal artery stenosis and three-vessel runoff below the knee. No significant aortoiliac disease. Successful drug-coated balloon angioplasty and 2 stent placement to both the popliteal artery and the SFA. - Continue DATP and statin.   2. ESRD - Undergoing HD prior to discharge then resume regular dialysis MWF.   3. HTN - BP was elevated during procedure. Soft low today during dialysis. Patient on low dose metoprolol 12.5mg  BID. ? Increase to 25mg  BID during non dialysis days. Dr. Gwenlyn Found decided to adjust during follow up.   The patient has been seen by  Dr. Gwenlyn Found today and deemed ready for discharge home. All follow-up appointments have been scheduled. Discharge medications are listed below.  _____________   Discharge Vitals Blood pressure (!) 151/81, pulse 90, temperature 98 F (36.7 C), temperature source Oral, resp. rate 15, height 5' 10.5" (1.791 m), weight 218 lb 11.1 oz (99.2 kg), last menstrual period  10/13/2014, SpO2 99 %.  Filed Weights   10/31/16 0417 10/31/16 0720 10/31/16 1144  Weight: 231 lb 4.8 oz (104.9 kg) 224 lb 3.3 oz (101.7 kg) 218 lb 11.1 oz (99.2 kg)    Labs & Radiologic Studies     CBC  Recent Labs  10/31/16 0715  WBC 5.5  HGB 10.1*  HCT 30.9*  MCV 100.7*  PLT 063   Basic Metabolic Panel  Recent Labs  10/31/16 0158  NA 137  K 4.3  CL 98*  CO2 26  GLUCOSE 192*  BUN 52*  CREATININE 8.81*  CALCIUM 9.2     No results found.  Disposition   Pt is being discharged home today in good condition.  Follow-up Plans & Appointments    Follow-up Information    Wellington Hampshire, MD. Go on 11/26/2016.   Specialty:  Cardiology Why:  @10 :40 for post procedure Contact information: [address] [place] [address] [place]. Go on 11/14/2016.   Specialty:  Cardiology Why:  @1 :30 pm for LE arterial doppler  Contact information: [address] [place] [phone]         Discharge Instructions    Diet - low sodium heart healthy    Complete by:  As directed    Discharge instructions    Complete by:  As directed    No driving for 48 hours. No lifting over 5 lbs for 1 week. No sexual activity for 1 week. Keep procedure site clean & dry. If you notice increased pain, swelling, bleeding or pus, call/return!  You may shower, but no soaking baths/hot tubs/pools for 1 week.   Increase activity slowly    Complete by:  As directed       Discharge Medications   Current Discharge Medication List    CONTINUE these medications which have NOT CHANGED   Details  acetaminophen-codeine (TYLENOL #3) 300-30 MG tablet Take 1 tablet by mouth 2 (two) times daily as needed for moderate pain.    albuterol (PROVENTIL HFA;VENTOLIN HFA) 108 (90 Base) MCG/ACT inhaler Inhale 2 puffs into the lungs every 6 (six) hours as needed for wheezing or shortness of breath. Qty: 3  Inhaler, Refills: 3   Associated Diagnoses: Seasonal allergies    albuterol (PROVENTIL) (2.5 MG/3ML) 0.083% nebulizer solution Take 3 mLs (2.5 mg total) by nebulization every 6 (six) hours as needed for wheezing or shortness of breath. Qty: 150 mL, Refills: 3    allopurinol (ZYLOPRIM) 300 MG tablet Take 1 tablet (300 mg total) by mouth daily. Qty: 90 tablet, Refills: 3   Associated Diagnoses: Gout of big toe    aspirin 81 MG chewable tablet Chew 1 tablet (81 mg total) by mouth daily. Qty: 30 tablet, Refills: 10    atorvastatin (LIPITOR) 40 MG tablet Take 1 tablet (40 mg total) by mouth every evening. Qty: 30 tablet, Refills: 3    citalopram (CELEXA) 20 MG tablet Take 1 tablet (20 mg total) by mouth daily. Qty: 30 tablet, Refills: 3    clopidogrel (PLAVIX) 75 MG tablet take 1 tablet by  mouth once daily Qty: 90 tablet, Refills: 3    ezetimibe (ZETIA) 10 MG tablet Take 1 tablet (10 mg total) by mouth daily. Qty: 90 tablet, Refills: 3   Associated Diagnoses: Chronic combined systolic and diastolic congestive heart failure (HCC)    fluticasone (FLONASE) 50 MCG/ACT nasal spray Place 2 sprays into both nostrils daily as needed for allergies or rhinitis. Qty: 16 g, Refills: 3   Associated Diagnoses: Seasonal allergies    gabapentin (NEURONTIN) 400 MG capsule Take 400 mg by mouth at bedtime.     hydrOXYzine (ATARAX/VISTARIL) 25 MG tablet take 1 tablet by mouth three times a day Qty: 90 tablet, Refills: 0   Associated Diagnoses: Adjustment disorder with anxious mood    insulin aspart (NOVOLOG) 100 UNIT/ML injection Inject 25-30 Units into the skin 3 (three) times daily before meals. Pens Qty: 15 mL, Refills: 5    Insulin Glargine (BASAGLAR KWIKPEN) 100 UNIT/ML SOPN INJECT 25 UNITS INTO THE SKIN AT BEDTIME. Qty: 9 mL, Refills: 0    isosorbide mononitrate (IMDUR) 60 MG 24 hr tablet Take 30 mg by mouth 2 (two) times daily.     lidocaine-prilocaine (EMLA) cream Apply 1 application  topically as directed. Refills: 1    loratadine (CLARITIN) 10 MG tablet Take 10 mg by mouth daily.    metoprolol tartrate (LOPRESSOR) 25 MG tablet Take 0.5 tablets (12.5 mg total) by mouth 2 (two) times daily. Qty: 90 tablet, Refills: 3    Naphazoline HCl (CLEAR EYES OP) Place 1 drop into both eyes as needed (for dry eyes).    prednisoLONE acetate (PRED FORTE) 1 % ophthalmic suspension Place 1 drop into both eyes 2 (two) times daily.  Refills: 1    ranitidine (ZANTAC 75) 75 MG tablet Take 1 tablet (75 mg total) by mouth daily as needed for heartburn.    senna-docusate (SENOKOT-S) 8.6-50 MG tablet Take 1 tablet by mouth at bedtime as needed for mild constipation. Qty: 30 tablet, Refills: 0    sevelamer (RENAGEL) 800 MG tablet Take 2,400 mg by mouth 3 (three) times daily with meals.    cadexomer iodine (IODOSORB) 0.9 % gel Apply 1 application topically daily as needed for wound care. Qty: 40 g, Refills: 0    ferric gluconate 125 mg in sodium chloride 0.9 % 100 mL Inject 125 mg into the vein Every Tuesday,Thursday,and Saturday with dialysis.    levothyroxine (SYNTHROID, LEVOTHROID) 25 MCG tablet take 1 tablet by mouth every morning BEFORE BREAKFAST Qty: 30 tablet, Refills: 2    nitroGLYCERIN (NITROSTAT) 0.4 MG SL tablet Place 1 tablet (0.4 mg total) under the tongue every 5 (five) minutes as needed. Qty: 30 tablet, Refills: 2   Associated Diagnoses: Essential hypertension    traMADol (ULTRAM) 50 MG tablet Take 1 tablet (50 mg total) by mouth every 12 (twelve) hours as needed. Qty: 30 tablet, Refills: 0    triamcinolone ointment (KENALOG) 0.5 % Apply 1 application topically daily.   Associated Diagnoses: Rash of hands            Outstanding Labs/Studies  None Duration of Discharge Encounter   Greater than 30 minutes including physician time.  Signed, Rimsha Trembley PA-C 10/31/2016, 1:12 PM

## 2016-10-31 NOTE — Consult Note (Signed)
Reason for Consult: To manage dialysis and dialysis related needs Referring Physician: Dr. [name] is an 61 y.o. female.  HPI: Pt is a 77F with a PMH significant for HTN, HLD, PAD, and ESRD on MWF dialysis who is now seen in consultation at the request of Dr. Fletcher Anon for management of ESRD and provision of dialysis.    Pt underwent popliteal and SFA stenting yesterday for PAD.    She is feeling well today.  She has no complaints.    Dialyzes at [facility] 4 hr 15 min  EDW 99 kg HD Bath 2K/ 2 Ca  Dialyzer F180  Heparin 6000 u bolus Access L AVF Mircera 50 mcg q 2 weeks, last given 7/11.  Past Medical History:  Diagnosis Date  . Anemia   . Anginal pain ([place])   . Anxiety   . Arthritis    "stiff fingers and knees" (08/04/2013), (12/12/2014)  . Asthma   . CAD (coronary artery disease) 2002; 2015   CABG x 6 2002, cath 2011- med Rx stent DES VG-Diag  . CAD (coronary artery disease) of artery bypass graft; DES to VG-Diag 09/28/13 11/09/2013  . Cataract   . CHF (congestive heart failure) ([place])    "in 2002" (11/26/2012)  . Chronic bronchitis ([place])    "q year; in the winter"   . CKD (chronic kidney disease)    stage 4, followed by [facility]  . Coronary artery disease 2002   CABG x 6. Cath 5/11- med Rx  . Diabetic neuropathy ([place])   . GERD (gastroesophageal reflux disease)   . Gout    "right big toe"  . Headache    "~ q week" (08/04/2013); "~ twice/month" (12/12/2014)  . History of blood transfusion 2002   "when I had OHS"  . Hyperlipidemia   . Hypertension   . Hypothyroid    treated  . Migraines    "couple times/year" (08/04/2013), (12/12/2014)  . Myocardial infarction ([place]) 2000; 2002; 2011  . Obesity (BMI 35.0-39.9 without comorbidity)   . Peripheral vascular disease ([place]) 12/12   LSFA PTA  . Pneumonia    "3 times I think" (12/12/2014)  . Stroke [facility])    " mini stroke"  . Type II diabetes mellitus ([place])     Past Surgical History:  Procedure Laterality Date  .  ABDOMINAL AORTAGRAM N/A 04/05/2011   Procedure: ABDOMINAL AORTAGRAM;  Surgeon: Lorretta Harp, MD;  Location: [facility] CATH LAB;  Service: Cardiovascular;  Laterality: N/A;  . ABDOMINAL AORTOGRAM N/A 10/30/2016   Procedure: Abdominal Aortogram;  Surgeon: Wellington Hampshire, MD;  Location: [place] CV LAB;  Service: Cardiovascular;  Laterality: N/A;  . APPENDECTOMY  1980  . AV FISTULA PLACEMENT Left 05/10/2016   Procedure: LEFT ARM ARTERIOVENOUS (AV) FISTULA CREATION;  Surgeon: Rosetta Posner, MD;  Location: [place];  Service: Vascular;  Laterality: Left;  . BASCILIC VEIN TRANSPOSITION Left 06/26/2016   Procedure: SECOND STAGE BASILIC VEIN TRANSPOSITION;  Surgeon: Rosetta Posner, MD;  Location: [place];  Service: Vascular;  Laterality: Left;  . BREAST CYST EXCISION Right 1970's  . CARDIAC CATHETERIZATION  2002  . CARDIAC CATHETERIZATION N/A 12/12/2014   Procedure: Left Heart Cath and Cors/Grafts Angiography;  Surgeon: Peter M Martinique, MD;  Location: [place] CV LAB;  Service: Cardiovascular;  Laterality: N/A;  . CARDIAC CATHETERIZATION N/A 12/12/2014   Procedure: Coronary Stent Intervention;  Surgeon: Peter M Martinique, MD;  Location: [place] CV LAB;  Service: Cardiovascular;  Laterality:  N/A;  . CATARACT EXTRACTION, BILATERAL    . CESAREAN SECTION  1978; 1980  . CHOLECYSTECTOMY  1982  . CORONARY ANGIOPLASTY WITH STENT PLACEMENT  2004; 2012   "I have 2 stents" (08/04/2013)  . CORONARY ANGIOPLASTY WITH STENT PLACEMENT  09/28/13   PTCA/ DES Xience stent to VG-Diag   . CORONARY ARTERY BYPASS GRAFT  11/20/2000   x6 LIMA to distal LAD, svg to first diag, svg to ramus intermediate branch and swquential SVG to cir marginal branch, SVG to posterior descending coronary and sequential SVG to first right posterolateral branch  . eye injections    . INSERTION OF DIALYSIS CATHETER Right 05/10/2016   Procedure: INSERTION OF DIALYSIS CATHETER - Right Internal Jugular Placement;  Surgeon: Rosetta Posner, MD;  Location: [place];   Service: Vascular;  Laterality: Right;  . LEFT HEART CATHETERIZATION WITH CORONARY/GRAFT ANGIOGRAM N/A 09/28/2013   Procedure: LEFT HEART CATHETERIZATION WITH Beatrix Fetters;  Surgeon: Peter M Martinique, MD;  Location: [facility] CATH LAB;  Service: Cardiovascular;  Laterality: N/A;  . LOWER EXTREMITY ANGIOGRAM  12/01/2012   Procedure: LOWER EXTREMITY ANGIOGRAM;  Surgeon: Lorretta Harp, MD;  Location: [facility] CATH LAB;  Service: Cardiovascular;;  . LOWER EXTREMITY ANGIOGRAPHY Right 10/30/2016   Procedure: Lower Extremity Angiography;  Surgeon: Wellington Hampshire, MD;  Location: [place] CV LAB;  Service: Cardiovascular;  Laterality: Right;  . NM MYOCAR PERF WALL MOTION  08/27/2004   negative  . PERCUTANEOUS STENT INTERVENTION Left 12/01/2012   Procedure: PERCUTANEOUS STENT INTERVENTION;  Surgeon: Lorretta Harp, MD;  Location: [facility] CATH LAB;  Service: Cardiovascular;  Laterality: Left;  Left SFA  . PERIPHERAL ARTERIAL STENT GRAFT Left    SFA/notes 04/07/2011 (11/30/2012)  . PERIPHERAL VASCULAR ATHERECTOMY Right 10/30/2016   Procedure: Peripheral Vascular Atherectomy;  Surgeon: Wellington Hampshire, MD;  Location: [place] CV LAB;  Service: Cardiovascular;  Laterality: Right;  cancel unable to  . PERIPHERAL VASCULAR INTERVENTION Right 10/30/2016   Procedure: Peripheral Vascular Intervention;  Surgeon: Wellington Hampshire, MD;  Location: [place] CV LAB;  Service: Cardiovascular;  Laterality: Right;  SFA  . RENAL ANGIOGRAM N/A 04/05/2011   Procedure: RENAL ANGIOGRAM;  Surgeon: Lorretta Harp, MD;  Location: [facility] CATH LAB;  Service: Cardiovascular;  Laterality: N/A;  . TUBAL LIGATION  1980    Family History  Problem Relation Age of Onset  . Diabetes Mother   . Hypertension Mother   . Stroke Mother   . Hypertension Father   . Hypertension Brother   . Hypertension Sister   . Diabetes Sister   . Hyperlipidemia Sister     Social History:  reports that she quit smoking about 16 years ago. Her  smoking use included Cigarettes. She smoked 0.00 packs per day for 25.00 years. She has never used smokeless tobacco. She reports that she does not drink alcohol or use drugs.  Allergies:  Allergies  Allergen Reactions  . Digoxin And Related Diarrhea and Other (See Comments)    TOXIC DRUG LEVELS Patient stated she almost died. Had flu like symptoms as well as diarrhea.  . Hydralazine Shortness Of Breath  . Penicillins Cross Reactors Hives and Other (See Comments)    HIGH FEVER  Has patient had a PCN reaction causing immediate rash, facial/tongue/throat swelling, SOB or lightheadedness with hypotension: No Has patient had a PCN reaction causing SEVERE RASH INVOLVING MUCUS MEMBRANES or SKIN NECROSIS  #  #  #  YES  #  #  #  Has  patient had a PCN reaction that required hospitalization: Unk Has patient had a PCN reaction occurring within the last 10 years: Unk If all of the above answers are "NO", then may proceed with Cephalosporin use.   [name] Lisinopril Other (See Comments)    Felt like she had the flu. Was very sick!!!  . Adhesive [Tape] Rash and Other (See Comments)    bruising    Medications:  Scheduled: . allopurinol  300 mg Oral Daily  . aspirin  81 mg Oral Daily  . atorvastatin  40 mg Oral QPM  . citalopram  20 mg Oral QHS  . clopidogrel  75 mg Oral Daily  . gabapentin  400 mg Oral QHS  . hydrOXYzine  25 mg Oral TID  . insulin aspart  25-30 Units Subcutaneous TID AC  . isosorbide mononitrate  30 mg Oral BID  . levothyroxine  25 mcg Oral QAC breakfast  . loratadine  10 mg Oral Daily  . metoprolol tartrate  12.5 mg Oral BID  . prednisoLONE acetate  1 drop Both Eyes BID  . sevelamer carbonate  800 mg Oral TID WC  . sodium chloride flush  3 mL Intravenous Q12H     Results for orders placed or performed during the hospital encounter of 10/30/16 (from the past 48 hour(s))  POCT Activated clotting time     Status: None   Collection Time: 10/30/16  2:27 PM  Result Value Ref  Range   Activated Clotting Time 224 seconds  POCT Activated clotting time     Status: None   Collection Time: 10/30/16  2:43 PM  Result Value Ref Range   Activated Clotting Time 230 seconds  POCT Activated clotting time     Status: None   Collection Time: 10/30/16  3:04 PM  Result Value Ref Range   Activated Clotting Time 241 seconds  POCT Activated clotting time     Status: None   Collection Time: 10/30/16  3:31 PM  Result Value Ref Range   Activated Clotting Time 219 seconds  Glucose, capillary     Status: Abnormal   Collection Time: 10/30/16  4:11 PM  Result Value Ref Range   Glucose-Capillary 119 (H) 65 - 99 mg/dL  POCT Activated clotting time     Status: None   Collection Time: 10/30/16  4:36 PM  Result Value Ref Range   Activated Clotting Time 180 seconds  Glucose, capillary     Status: Abnormal   Collection Time: 10/30/16  9:40 PM  Result Value Ref Range   Glucose-Capillary 247 (H) 65 - 99 mg/dL  Basic metabolic panel     Status: Abnormal   Collection Time: 10/31/16  1:58 AM  Result Value Ref Range   Sodium 137 135 - 145 mmol/L   Potassium 4.3 3.5 - 5.1 mmol/L   Chloride 98 (L) 101 - 111 mmol/L   CO2 26 22 - 32 mmol/L   Glucose, Bld 192 (H) 65 - 99 mg/dL   BUN 52 (H) 6 - 20 mg/dL   Creatinine, Ser 8.81 (H) 0.44 - 1.00 mg/dL   Calcium 9.2 8.9 - 10.3 mg/dL   GFR calc non Af Amer 4 (L) >60 mL/min   GFR calc Af Amer 5 (L) >60 mL/min    Comment: (NOTE) The eGFR has been calculated using the CKD EPI equation. This calculation has not been validated in all clinical situations. eGFR's persistently <60 mL/min signify possible Chronic Kidney Disease.    Anion gap 13 5 - 15  CBC     Status: Abnormal   Collection Time: 10/31/16  7:15 AM  Result Value Ref Range   WBC 5.5 4.0 - 10.5 K/uL   RBC 3.07 (L) 3.87 - 5.11 MIL/uL   Hemoglobin 10.1 (L) 12.0 - 15.0 g/dL   HCT 30.9 (L) 36.0 - 46.0 %   MCV 100.7 (H) 78.0 - 100.0 fL   MCH 32.9 26.0 - 34.0 pg   MCHC 32.7 30.0 - 36.0  g/dL   RDW 13.7 11.5 - 15.5 %   Platelets 169 150 - 400 K/uL    No results found.  ROS: all other systems reviewed and are negative except as per HPI Blood pressure 116/71, pulse 82, temperature 97.6 F (36.4 C), temperature source Oral, resp. rate 14, height 5' 10.5" (1.791 m), weight 101.7 kg (224 lb 3.3 oz), last menstrual period 10/13/2014, SpO2 99 %. .  GEN obese, NAD HEENT sclerae anicteric NECK no JVD PULM clear bilaterally CV RRR ABD soft EXT both LEs warm, R great toe ulcer NEURO nonfocal ACCESS: L AVF + T/B  Assessment/Plan: 1 Critical limb ischemia: s/p popliteal and SFA stenting.  ASA/ Plavix.  Per cardiology 2 ESRD: normally MWF, providing HD today, then back to regular schedule tomorrow.  2K bath. No heparin today 3 Hypertension/ Volume: appears relatively euvolemic, UF to EDW; on metop/ imdur 4. Anemia of ESRD: ESA not due yet 5. Metabolic Bone Disease: continue renvela 6.  Nutrition: renal diet/ vitamins  Brooke Stafford 10/31/2016, 8:38 AM

## 2016-10-31 NOTE — Progress Notes (Signed)
Progress Note  Patient Name: Brooke Stafford Date of Encounter: 10/31/2016  Primary Cardiologist: Dr. Fletcher Anon   Subjective   Has R leg pain. Seen in HD. No chest pain or dyspnea.   Inpatient Medications    Scheduled Meds: . allopurinol  300 mg Oral Daily  . aspirin  81 mg Oral Daily  . atorvastatin  40 mg Oral QPM  . citalopram  20 mg Oral QHS  . clopidogrel  75 mg Oral Daily  . gabapentin  400 mg Oral QHS  . hydrOXYzine  25 mg Oral TID  . insulin aspart  25-30 Units Subcutaneous TID AC  . isosorbide mononitrate  30 mg Oral BID  . levothyroxine  25 mcg Oral QAC breakfast  . loratadine  10 mg Oral Daily  . metoprolol tartrate  12.5 mg Oral BID  . prednisoLONE acetate  1 drop Both Eyes BID  . sevelamer carbonate  800 mg Oral TID WC  . sodium chloride flush  3 mL Intravenous Q12H   Continuous Infusions: . sodium chloride    . sodium chloride    . sodium chloride     PRN Meds: sodium chloride, sodium chloride, sodium chloride, acetaminophen, acetaminophen-codeine, albuterol, alteplase, famotidine, heparin, heparin, lidocaine (PF), lidocaine-prilocaine, nitroGLYCERIN, ondansetron (ZOFRAN) IV, pentafluoroprop-tetrafluoroeth, senna-docusate, sodium chloride flush   Vital Signs    Vitals:   10/31/16 0830 10/31/16 0900 10/31/16 0915 10/31/16 0930  BP: 102/63 113/60 106/69 117/65  Pulse: 81 63 82 83  Resp:      Temp:      TempSrc:      SpO2:      Weight:      Height:        Intake/Output Summary (Last 24 hours) at 10/31/16 0948 Last data filed at 10/31/16 9323  Gross per 24 hour  Intake              360 ml  Output              400 ml  Net              -40 ml   Filed Weights   10/30/16 1008 10/31/16 0417 10/31/16 0720  Weight: 214 lb (97.1 kg) 231 lb 4.8 oz (104.9 kg) 224 lb 3.3 oz (101.7 kg)    Telemetry    SR with PVCs - Personally Reviewed  ECG  None today   Physical Exam   GEN: No acute distress.   Neck: No JVD Cardiac: RRR, no murmurs, rubs, or  gallops. L groin cath site without hematoma Respiratory: Clear to auscultation bilaterally. GI: Soft, nontender, non-distended  MS: No edema; No deformity. Neuro:  Nonfocal  Psych: Normal affect  Extremity: distal pulse weak. Ulceration on R big toe.   Labs    Chemistry Recent Labs Lab 10/31/16 0158  NA 137  K 4.3  CL 98*  CO2 26  GLUCOSE 192*  BUN 52*  CREATININE 8.81*  CALCIUM 9.2  GFRNONAA 4*  GFRAA 5*  ANIONGAP 13     Hematology Recent Labs Lab 10/31/16 0715  WBC 5.5  RBC 3.07*  HGB 10.1*  HCT 30.9*  MCV 100.7*  MCH 32.9  MCHC 32.7  RDW 13.7  PLT 169    Radiology    No results found.  Cardiac Studies   Abdominal Aortogram  Lower Extremity Angiography  Peripheral Vascular Atherectomy  Peripheral Vascular Intervention  Conclusion   1. No significant aortoiliac disease. 2. Diffuse proximal and midright SFA disease with  short occlusion distally, short proximal right popliteal artery stenosis and three-vessel runoff below the knee. 3. Successful drug-coated balloon angioplasty and 2 stent placement to both the popliteal artery and the SFA as outlined above.  Recommendations: Continue dual antiplatelet therapy. Admit the patient for dialysis and blood pressure control. Possible discharge home tomorrow if no complications.     Patient Profile     61 y.o. female coronary artery disease with previous CABG and stent placement, chronic systolic and diastolic heart failure, insulin-requiring diabetes mellitus, PAD, hypertension, hyperlipidemia and end-stage renal disease on hemodialysis  (MWF) presented for PV angiogram.   She is known to have peripheral arterial disease with previous left SFA stent placement in 2012 and 2014.  She developed right great toe ulceration in April which has not healed since then. She is being followed by Dr. Cannon Kettle in podiatry. She has no significant leg claudication. She underwent recent lower symptoms get Doppler which  showed an ABI of 0.6 on the right side with evidence of short occlusion of the popliteal artery.The left SFA stent was noted to be occluded.  Assessment & Plan    1. PAD - PV angiogram showed Diffuse proximal and midright SFA disease with short occlusion distally, short proximal right popliteal artery stenosis and three-vessel runoff below the knee. No significant aortoiliac disease. Successful drug-coated balloon angioplasty and 2 stent placement to both the popliteal artery and the SFA. - Continue DATP and statin.   2. ESRD - Undergoing HD today then resume regular dialysis MWF.   3. HTN - BP was elevated yesterday. Soft low today during dialysis. Patient on low dose metoprolol 12.5mg  BID. ? Increase to 25mg  BID during non dialysis days. MD to decide.    Likely home later today post HD.   Signed, Leanor Kail, PA  10/31/2016, 9:48 AM    Agree with note by Robbie Lis PA-C  Looks great s/p RSFA PTA/ Self expanding stent for CLI. Left groin OK. Exam benign. Getting HD currently. OK for DC home after that. We will set up OP LEAs at [facility] next week then ROV with DR Kerry Kass, M.D., [place], [facility], [place], [place] [address]. [place], [place]  [address]  [phone] 10/31/2016 11:32 AM

## 2016-10-31 NOTE — Care Management Obs Status (Signed)
[place] NOTIFICATION   Patient Details  Name: Brooke Stafford MRN: [id number] Date of Birth: Aug 26, 1955   Medicare Observation Status Notification Given:  Yes    Bethena Roys, RN 10/31/2016, 2:18 PM

## 2016-11-01 DIAGNOSIS — D631 Anemia in chronic kidney disease: Secondary | ICD-10-CM | POA: Diagnosis not present

## 2016-11-01 DIAGNOSIS — N186 End stage renal disease: Secondary | ICD-10-CM | POA: Diagnosis not present

## 2016-11-01 DIAGNOSIS — D509 Iron deficiency anemia, unspecified: Secondary | ICD-10-CM | POA: Diagnosis not present

## 2016-11-01 DIAGNOSIS — E1129 Type 2 diabetes mellitus with other diabetic kidney complication: Secondary | ICD-10-CM | POA: Diagnosis not present

## 2016-11-01 DIAGNOSIS — N2581 Secondary hyperparathyroidism of renal origin: Secondary | ICD-10-CM | POA: Diagnosis not present

## 2016-11-04 DIAGNOSIS — N2581 Secondary hyperparathyroidism of renal origin: Secondary | ICD-10-CM | POA: Diagnosis not present

## 2016-11-04 DIAGNOSIS — E1129 Type 2 diabetes mellitus with other diabetic kidney complication: Secondary | ICD-10-CM | POA: Diagnosis not present

## 2016-11-04 DIAGNOSIS — D509 Iron deficiency anemia, unspecified: Secondary | ICD-10-CM | POA: Diagnosis not present

## 2016-11-04 DIAGNOSIS — D631 Anemia in chronic kidney disease: Secondary | ICD-10-CM | POA: Diagnosis not present

## 2016-11-04 DIAGNOSIS — N186 End stage renal disease: Secondary | ICD-10-CM | POA: Diagnosis not present

## 2016-11-05 ENCOUNTER — Ambulatory Visit (INDEPENDENT_AMBULATORY_CARE_PROVIDER_SITE_OTHER): Payer: Medicare Other | Admitting: Sports Medicine

## 2016-11-05 DIAGNOSIS — I739 Peripheral vascular disease, unspecified: Secondary | ICD-10-CM

## 2016-11-05 DIAGNOSIS — E1142 Type 2 diabetes mellitus with diabetic polyneuropathy: Secondary | ICD-10-CM

## 2016-11-05 DIAGNOSIS — M79675 Pain in left toe(s): Secondary | ICD-10-CM

## 2016-11-05 DIAGNOSIS — E11621 Type 2 diabetes mellitus with foot ulcer: Secondary | ICD-10-CM

## 2016-11-05 DIAGNOSIS — B351 Tinea unguium: Secondary | ICD-10-CM

## 2016-11-05 DIAGNOSIS — L97519 Non-pressure chronic ulcer of other part of right foot with unspecified severity: Secondary | ICD-10-CM

## 2016-11-05 DIAGNOSIS — M79674 Pain in right toe(s): Secondary | ICD-10-CM

## 2016-11-05 NOTE — Progress Notes (Signed)
Subjective: Brooke Stafford is a 61 y.o. female patient seen in office for follow up evaluation of ulceration of the right great toe. Patient has a history of diabetes and a blood glucose level not recorded. Reports that she had vascular intervention with Dr. Fletcher Anon and that things are better but has throbbing feeling to right leg and may have to get left leg done too.  Patient is not putting anything on right big toe, states that there is a dry scab and that she needs her nails trimmed. Denies nausea/fever/vomiting/chills/night sweats/shortness of breath/pain. Patient has no other pedal complaints at this time.  Patient is also a dialysis patient.   Patient Active Problem List   Diagnosis Date Noted  . Critical lower limb ischemia 10/30/2016  . AV (arteriovenous fistula) ([place]) 06/17/2016  . Mixed hyperlipidemia 05/21/2016  . ESRD (end stage renal disease) ([place])   . Hypokalemia 05/03/2016  . COPD exacerbation ([place]) 05/03/2016  . COPD (chronic obstructive pulmonary disease) ([place]) 04/11/2016  . CAP (community acquired pneumonia) 03/26/2016  . Acute respiratory failure with hypoxia ([place]) 03/26/2016  . Acute on chronic combined systolic and diastolic CHF (congestive heart failure) ([place]) 03/26/2016  . Chronic kidney disease (CKD), stage IV (severe) ([place]) 03/26/2016  . Medication management 03/21/2016  . Anemia of chronic disease 12/27/2015  . Acute on chronic systolic CHF (congestive heart failure) ([place])   . Acute CHF ([place]) 12/18/2015  . Hypertensive heart and renal disease with CHF and ESRD ([place]) 12/18/2015  . Constipation 12/18/2015  . Adjustment disorder with anxious mood 11/02/2015  . Posterior circulation stroke ([place]) 11/02/2015  . Abnormality of gait 10/23/2015  . Depression 09/28/2015  . Diabetes mellitus due to underlying condition with diabetic peripheral angiopathy without gangrene, with long-term current use of insulin ([place]) 09/13/2015  . Diabetic retinopathy ([place]) 09/13/2015  .  Grief reaction 08/24/2015  . Acute kidney injury superimposed on chronic kidney disease ([place]) 07/25/2015  . Creatinine elevation 07/25/2015  . Chronic combined systolic and diastolic congestive heart failure ([place]) 07/25/2015  . Rash of hands 02/23/2015  . Essential hypertension   . Angina pectoris ([place]) 12/12/2014  . Abnormal nuclear stress test   . Acute combined systolic and diastolic heart failure ([place]) 12/02/2014  . Seasonal allergies 08/11/2014  . Gout 08/11/2014  . Encounter for screening mammogram for breast cancer 05/16/2014  . Screening for colon cancer 05/16/2014  . Diabetic neuropathy, type II diabetes mellitus ([place]) 01/27/2014  . Atopic eczema 01/27/2014  . Precordial pain, atypical, negative MI, Musculature Skeletal pain  11/08/2013  . CKD (chronic kidney disease) stage 5, GFR less than 15 ml/min ([facility]) 11/08/2013  . Unstable angina ([place]) 09/28/2013  . Ischemic cardiomyopathy- new drop in EF 08/30/2013  . Chest pain 08/04/2013  . CAD -S/P PCI June 2015 and 12/14/14 02/01/2013  . Hypothyroidism, acquired 02/01/2013  . PVD, LSFA PTA 12/12 04/06/2011  . Hx of CABG x 6 2002 04/06/2011  . Dyslipidemia 04/06/2011   Current Outpatient Prescriptions on File Prior to Visit  Medication Sig Dispense Refill  . acetaminophen-codeine (TYLENOL #3) 300-30 MG tablet Take 1 tablet by mouth 2 (two) times daily as needed for moderate pain.    [name] albuterol (PROVENTIL HFA;VENTOLIN HFA) 108 (90 Base) MCG/ACT inhaler Inhale 2 puffs into the lungs every 6 (six) hours as needed for wheezing or shortness of breath. 3 Inhaler 3  . albuterol (PROVENTIL) (2.5 MG/3ML) 0.083% nebulizer solution Take 3 mLs (2.5 mg total) by nebulization every 6 (six) hours as needed for  wheezing or shortness of breath. 150 mL 3  . allopurinol (ZYLOPRIM) 300 MG tablet Take 1 tablet (300 mg total) by mouth daily. 90 tablet 3  . aspirin 81 MG chewable tablet Chew 1 tablet (81 mg total) by mouth daily. 30 tablet 10  .  atorvastatin (LIPITOR) 40 MG tablet Take 1 tablet (40 mg total) by mouth every evening. 30 tablet 3  . cadexomer iodine (IODOSORB) 0.9 % gel Apply 1 application topically daily as needed for wound care. 40 g 0  . citalopram (CELEXA) 20 MG tablet Take 1 tablet (20 mg total) by mouth daily. (Patient taking differently: Take 20 mg by mouth at bedtime. ) 30 tablet 3  . clopidogrel (PLAVIX) 75 MG tablet take 1 tablet by mouth once daily (Patient taking differently: take 75 mg by mouth at bedtime) 90 tablet 3  . ezetimibe (ZETIA) 10 MG tablet Take 1 tablet (10 mg total) by mouth daily. 90 tablet 3  . ferric gluconate 125 mg in sodium chloride 0.9 % 100 mL Inject 125 mg into the vein Every Tuesday,Thursday,and Saturday with dialysis.    . fluticasone (FLONASE) 50 MCG/ACT nasal spray Place 2 sprays into both nostrils daily as needed for allergies or rhinitis. 16 g 3  . gabapentin (NEURONTIN) 400 MG capsule Take 400 mg by mouth at bedtime.     . hydrOXYzine (ATARAX/VISTARIL) 25 MG tablet take 1 tablet by mouth three times a day (Patient taking differently: take 25 mg by mouth three times a day) 90 tablet 0  . insulin aspart (NOVOLOG) 100 UNIT/ML injection Inject 25-30 Units into the skin 3 (three) times daily before meals. [place] (Patient taking differently: Inject 25-30 Units into the skin 3 (three) times daily before meals. Per sliding scale) 15 mL 5  . Insulin Glargine (BASAGLAR KWIKPEN) 100 UNIT/ML SOPN INJECT 25 UNITS INTO THE SKIN AT BEDTIME. 9 mL 0  . isosorbide mononitrate (IMDUR) 60 MG 24 hr tablet Take 30 mg by mouth 2 (two) times daily.     [name] levothyroxine (SYNTHROID, LEVOTHROID) 25 MCG tablet take 1 tablet by mouth every morning BEFORE BREAKFAST 30 tablet 2  . lidocaine-prilocaine (EMLA) cream Apply 1 application topically as directed.  1  . loratadine (CLARITIN) 10 MG tablet Take 10 mg by mouth daily.    . metoprolol tartrate (LOPRESSOR) 25 MG tablet Take 0.5 tablets (12.5 mg total) by mouth 2 (two)  times daily. 90 tablet 3  . Naphazoline HCl (CLEAR EYES OP) Place 1 drop into both eyes as needed (for dry eyes).    . nitroGLYCERIN (NITROSTAT) 0.4 MG SL tablet Place 1 tablet (0.4 mg total) under the tongue every 5 (five) minutes as needed. (Patient taking differently: Place 0.4 mg under the tongue every 5 (five) minutes as needed for chest pain. ) 30 tablet 2  . prednisoLONE acetate (PRED FORTE) 1 % ophthalmic suspension Place 1 drop into both eyes 2 (two) times daily.   1  . ranitidine (ZANTAC 75) 75 MG tablet Take 1 tablet (75 mg total) by mouth daily as needed for heartburn.    . senna-docusate (SENOKOT-S) 8.6-50 MG tablet Take 1 tablet by mouth at bedtime as needed for mild constipation. 30 tablet 0  . sevelamer (RENAGEL) 800 MG tablet Take 2,400 mg by mouth 3 (three) times daily with meals.    . traMADol (ULTRAM) 50 MG tablet Take 1 tablet (50 mg total) by mouth every 12 (twelve) hours as needed. 30 tablet 0  . triamcinolone ointment (KENALOG)  0.5 % Apply 1 application topically daily.     No current facility-administered medications on file prior to visit.    Allergies  Allergen Reactions  . Digoxin And Related Diarrhea and Other (See Comments)    TOXIC DRUG LEVELS Patient stated she almost died. Had flu like symptoms as well as diarrhea.  . Hydralazine Shortness Of Breath  . Penicillins Cross Reactors Hives and Other (See Comments)    HIGH FEVER  Has patient had a PCN reaction causing immediate rash, facial/tongue/throat swelling, SOB or lightheadedness with hypotension: No Has patient had a PCN reaction causing SEVERE RASH INVOLVING MUCUS MEMBRANES or SKIN NECROSIS  #  #  #  YES  #  #  #  Has patient had a PCN reaction that required hospitalization: Unk Has patient had a PCN reaction occurring within the last 10 years: Unk If all of the above answers are "NO", then may proceed with Cephalosporin use.   [name] Lisinopril Other (See Comments)    Felt like she had the flu. Was very  sick!!!  . Adhesive [Tape] Rash and Other (See Comments)    bruising    Recent Results (from the past 2160 hour(s))  POCT glycosylated hemoglobin (Hb A1C)     Status: Abnormal   Collection Time: 08/13/16  4:01 PM  Result Value Ref Range   Hemoglobin A1C 6.5   Glucose (CBG)     Status: Abnormal   Collection Time: 08/27/16  4:13 PM  Result Value Ref Range   POC Glucose 168 (A) 70 - 99 mg/dl  Basic metabolic panel     Status: Abnormal   Collection Time: 10/22/16 11:51 AM  Result Value Ref Range   Glucose 174 (H) 65 - 99 mg/dL   BUN 36 (H) 8 - 27 mg/dL   Creatinine, Ser 6.38 (H) 0.57 - 1.00 mg/dL   GFR calc non Af Amer 6 (L) >59 mL/min/1.73   GFR calc Af Amer 7 (L) >59 mL/min/1.73   BUN/Creatinine Ratio 6 (L) 12 - 28   Sodium 139 134 - 144 mmol/L   Potassium 4.5 3.5 - 5.2 mmol/L   Chloride 93 (L) 96 - 106 mmol/L   CO2 28 20 - 29 mmol/L   Calcium 10.5 (H) 8.7 - 10.3 mg/dL  CBC     Status: Abnormal   Collection Time: 10/22/16 11:51 AM  Result Value Ref Range   WBC 4.3 3.4 - 10.8 x10E3/uL   RBC 3.49 (L) 3.77 - 5.28 x10E6/uL   Hemoglobin 11.5 11.1 - 15.9 g/dL   Hematocrit 35.2 34.0 - 46.6 %   MCV 101 (H) 79 - 97 fL   MCH 33.0 26.6 - 33.0 pg   MCHC 32.7 31.5 - 35.7 g/dL   RDW 14.9 12.3 - 15.4 %   Platelets 182 150 - 379 x10E3/uL  INR/PT     Status: None   Collection Time: 10/22/16 11:51 AM  Result Value Ref Range   INR 0.9 0.8 - 1.2    Comment: Reference interval is for non-anticoagulated patients. Suggested INR therapeutic range for Vitamin K antagonist therapy:    Standard Dose (moderate intensity                   therapeutic range):       2.0 - 3.0    Higher intensity therapeutic range       2.5 - 3.5    Prothrombin Time 10.0 9.1 - 12.0 sec  POCT Activated clotting time  Status: None   Collection Time: 10/30/16  2:27 PM  Result Value Ref Range   Activated Clotting Time 224 seconds  POCT Activated clotting time     Status: None   Collection Time: 10/30/16  2:43  PM  Result Value Ref Range   Activated Clotting Time 230 seconds  POCT Activated clotting time     Status: None   Collection Time: 10/30/16  3:04 PM  Result Value Ref Range   Activated Clotting Time 241 seconds  POCT Activated clotting time     Status: None   Collection Time: 10/30/16  3:31 PM  Result Value Ref Range   Activated Clotting Time 219 seconds  Glucose, capillary     Status: Abnormal   Collection Time: 10/30/16  4:11 PM  Result Value Ref Range   Glucose-Capillary 119 (H) 65 - 99 mg/dL  POCT Activated clotting time     Status: None   Collection Time: 10/30/16  4:36 PM  Result Value Ref Range   Activated Clotting Time 180 seconds  Glucose, capillary     Status: Abnormal   Collection Time: 10/30/16  9:40 PM  Result Value Ref Range   Glucose-Capillary 247 (H) 65 - 99 mg/dL  Basic metabolic panel     Status: Abnormal   Collection Time: 10/31/16  1:58 AM  Result Value Ref Range   Sodium 137 135 - 145 mmol/L   Potassium 4.3 3.5 - 5.1 mmol/L   Chloride 98 (L) 101 - 111 mmol/L   CO2 26 22 - 32 mmol/L   Glucose, Bld 192 (H) 65 - 99 mg/dL   BUN 52 (H) 6 - 20 mg/dL   Creatinine, Ser 8.81 (H) 0.44 - 1.00 mg/dL   Calcium 9.2 8.9 - 10.3 mg/dL   GFR calc non Af Amer 4 (L) >60 mL/min   GFR calc Af Amer 5 (L) >60 mL/min    Comment: (NOTE) The eGFR has been calculated using the CKD EPI equation. This calculation has not been validated in all clinical situations. eGFR's persistently <60 mL/min signify possible Chronic Kidney Disease.    Anion gap 13 5 - 15  CBC     Status: Abnormal   Collection Time: 10/31/16  7:15 AM  Result Value Ref Range   WBC 5.5 4.0 - 10.5 K/uL   RBC 3.07 (L) 3.87 - 5.11 MIL/uL   Hemoglobin 10.1 (L) 12.0 - 15.0 g/dL   HCT 30.9 (L) 36.0 - 46.0 %   MCV 100.7 (H) 78.0 - 100.0 fL   MCH 32.9 26.0 - 34.0 pg   MCHC 32.7 30.0 - 36.0 g/dL   RDW 13.7 11.5 - 15.5 %   Platelets 169 150 - 400 K/uL  Hepatitis B surface antigen     Status: None   Collection  Time: 10/31/16  7:15 AM  Result Value Ref Range   Hepatitis B Surface Ag Negative Negative    Comment: (NOTE) Performed At: [facility] [address] [place], [place] [id number] Lindon Romp MD QQ:[id number]   Glucose, capillary     Status: Abnormal   Collection Time: 10/31/16 12:19 PM  Result Value Ref Range   Glucose-Capillary 177 (H) 65 - 99 mg/dL    Objective: There were no vitals filed for this visit.   General: Patient is awake, alert, oriented x 3 and in no acute distress.  Dermatology: Skin is warm and dry bilateral with a partial thickness thickness ulceration present at right great toe. Ulceration measures 0.3 cm x 0.2  cm x 0.1 cm. There is a keratotic border with a granular base. The ulceration does not probe to bone. There is no malodor, no active drainage, no erythema, no edema. No other acute signs of infection. Nails are elongated and mycotic.    Vascular: Dorsalis Pedis pulse = 1/4 Bilateral,  Posterior Tibial pulse = 0/4 Bilateral,  Capillary Fill Time < 5 seconds  Neurologic: Protective sensation absent bilateral.  Musculosketal:  Mild Pain with palpation to ulcerated area. No pain with compression to calves bilateral. Asymptomatic hammertoe bony deformities noted bilateral.  Recent Labs  05/04/16 [place] WBC PRESENT,BOTH PMN AND MONONUCLEARNO ORGANISMS SEEN    Assessment and Plan:  Problem List Items Addressed This Visit    None    Visit Diagnoses    Ulcer of right great toe due to diabetes mellitus ([place])    -  Primary   Diabetic polyneuropathy associated with type 2 diabetes mellitus (H[facility])       PAD (peripheral artery disease) ([facility])       Pain due to onychomycosis of toenails of both feet         -Examined patient and discussed the progression of the wound and treatment alternatives. - Excisionally dedbrided ulceration to healthy bleeding borders using a sterile chisel  blade. -Applied betadine and dry sterile dressing  and instructed patient to continue with daily dressings at home consisting of the same. -Continue post op shoe  -Nails debrided using sterile nail nipper without incident -Gave toe seperator for right foot  -Patient to return in 3-4 weeks for toe recheck after upcoming vascular appts for follow up care and evaluation or sooner if problems arise.  Landis Martins, DPM

## 2016-11-06 ENCOUNTER — Other Ambulatory Visit: Payer: Self-pay | Admitting: Physician Assistant

## 2016-11-06 DIAGNOSIS — N2581 Secondary hyperparathyroidism of renal origin: Secondary | ICD-10-CM | POA: Diagnosis not present

## 2016-11-06 DIAGNOSIS — N186 End stage renal disease: Secondary | ICD-10-CM | POA: Diagnosis not present

## 2016-11-06 DIAGNOSIS — D631 Anemia in chronic kidney disease: Secondary | ICD-10-CM | POA: Diagnosis not present

## 2016-11-06 DIAGNOSIS — E1129 Type 2 diabetes mellitus with other diabetic kidney complication: Secondary | ICD-10-CM | POA: Diagnosis not present

## 2016-11-06 DIAGNOSIS — I739 Peripheral vascular disease, unspecified: Secondary | ICD-10-CM

## 2016-11-06 DIAGNOSIS — D509 Iron deficiency anemia, unspecified: Secondary | ICD-10-CM | POA: Diagnosis not present

## 2016-11-08 DIAGNOSIS — N186 End stage renal disease: Secondary | ICD-10-CM | POA: Diagnosis not present

## 2016-11-08 DIAGNOSIS — E1129 Type 2 diabetes mellitus with other diabetic kidney complication: Secondary | ICD-10-CM | POA: Diagnosis not present

## 2016-11-08 DIAGNOSIS — D509 Iron deficiency anemia, unspecified: Secondary | ICD-10-CM | POA: Diagnosis not present

## 2016-11-08 DIAGNOSIS — D631 Anemia in chronic kidney disease: Secondary | ICD-10-CM | POA: Diagnosis not present

## 2016-11-08 DIAGNOSIS — N2581 Secondary hyperparathyroidism of renal origin: Secondary | ICD-10-CM | POA: Diagnosis not present

## 2016-11-11 DIAGNOSIS — N186 End stage renal disease: Secondary | ICD-10-CM | POA: Diagnosis not present

## 2016-11-11 DIAGNOSIS — D509 Iron deficiency anemia, unspecified: Secondary | ICD-10-CM | POA: Diagnosis not present

## 2016-11-11 DIAGNOSIS — D631 Anemia in chronic kidney disease: Secondary | ICD-10-CM | POA: Diagnosis not present

## 2016-11-11 DIAGNOSIS — E1129 Type 2 diabetes mellitus with other diabetic kidney complication: Secondary | ICD-10-CM | POA: Diagnosis not present

## 2016-11-11 DIAGNOSIS — N2581 Secondary hyperparathyroidism of renal origin: Secondary | ICD-10-CM | POA: Diagnosis not present

## 2016-11-12 ENCOUNTER — Other Ambulatory Visit (HOSPITAL_COMMUNITY)
Admission: RE | Admit: 2016-11-12 | Discharge: 2016-11-12 | Disposition: A | Discharge status: Home / Self Care | Payer: Medicare Other | Source: Ambulatory Visit | Attending: Family Medicine | Admitting: Family Medicine

## 2016-11-12 ENCOUNTER — Ambulatory Visit: Payer: Medicare Other | Attending: Family Medicine | Admitting: Family Medicine

## 2016-11-12 ENCOUNTER — Encounter: Payer: Self-pay | Admitting: Family Medicine

## 2016-11-12 VITALS — BP 126/72 | HR 77 | Temp 99.0°F | Resp 18 | Ht 70.0 in | Wt 224.0 lb

## 2016-11-12 DIAGNOSIS — Z1231 Encounter for screening mammogram for malignant neoplasm of breast: Secondary | ICD-10-CM

## 2016-11-12 DIAGNOSIS — N841 Polyp of cervix uteri: Secondary | ICD-10-CM | POA: Diagnosis not present

## 2016-11-12 DIAGNOSIS — M79604 Pain in right leg: Secondary | ICD-10-CM | POA: Diagnosis not present

## 2016-11-12 DIAGNOSIS — Z0000 Encounter for general adult medical examination without abnormal findings: Secondary | ICD-10-CM

## 2016-11-12 DIAGNOSIS — Z992 Dependence on renal dialysis: Secondary | ICD-10-CM | POA: Diagnosis not present

## 2016-11-12 DIAGNOSIS — I129 Hypertensive chronic kidney disease with stage 1 through stage 4 chronic kidney disease, or unspecified chronic kidney disease: Secondary | ICD-10-CM | POA: Diagnosis not present

## 2016-11-12 DIAGNOSIS — Z1239 Encounter for other screening for malignant neoplasm of breast: Secondary | ICD-10-CM

## 2016-11-12 DIAGNOSIS — Z124 Encounter for screening for malignant neoplasm of cervix: Secondary | ICD-10-CM | POA: Insufficient documentation

## 2016-11-12 DIAGNOSIS — N186 End stage renal disease: Secondary | ICD-10-CM | POA: Diagnosis not present

## 2016-11-12 DIAGNOSIS — Z1159 Encounter for screening for other viral diseases: Secondary | ICD-10-CM | POA: Diagnosis not present

## 2016-11-12 DIAGNOSIS — Z01419 Encounter for gynecological examination (general) (routine) without abnormal findings: Secondary | ICD-10-CM

## 2016-11-12 MED ORDER — TRAMADOL HCL 50 MG PO TABS: 50.0000 mg | ORAL_TABLET | Freq: Two times a day (BID) | ORAL | 1 refills | Status: DC | PRN

## 2016-11-12 NOTE — Progress Notes (Signed)
Subjective:  Patient ID: Brooke Stafford, female    DOB: 14-Aug-1955  Age: 61 y.o. MRN: [id number]  CC: Annual Exam   HPI Brooke Stafford presents for A complete physical exam. She was recently discharged from [facility] status post right SFA intervention for critical limb ischemia on 10/30/16. She continues to complain of pain in her right leg.  Past Medical History:  Diagnosis Date  . Anemia   . Anginal pain ([place])   . Anxiety   . Arthritis    "stiff fingers and knees" (08/04/2013), (12/12/2014)  . Asthma   . CAD (coronary artery disease) 2002; 2015   CABG x 6 2002, cath 2011- med Rx stent DES VG-Diag  . CAD (coronary artery disease) of artery bypass graft; DES to VG-Diag 09/28/13 11/09/2013  . Cataract   . CHF (congestive heart failure) ([place])    "in 2002" (11/26/2012)  . Chronic bronchitis ([place])    "q year; in the winter"   . CKD (chronic kidney disease)    stage 4, followed by [facility]  . Coronary artery disease 2002   CABG x 6. Cath 5/11- med Rx  . Diabetic neuropathy ([place])   . GERD (gastroesophageal reflux disease)   . Gout    "right big toe"  . Headache    "~ q week" (08/04/2013); "~ twice/month" (12/12/2014)  . History of blood transfusion 2002   "when I had OHS"  . Hyperlipidemia   . Hypertension   . Hypothyroid    treated  . Migraines    "couple times/year" (08/04/2013), (12/12/2014)  . Myocardial infarction ([place]) 2000; 2002; 2011  . Obesity (BMI 35.0-39.9 without comorbidity)   . Peripheral vascular disease ([place]) 12/12   LSFA PTA  . Pneumonia    "3 times I think" (12/12/2014)  . Stroke [facility])    " mini stroke"  . Type II diabetes mellitus ([place])     Past Surgical History:  Procedure Laterality Date  . ABDOMINAL AORTAGRAM N/A 04/05/2011   Procedure: ABDOMINAL AORTAGRAM;  Surgeon: Lorretta Harp, MD;  Location: [facility] CATH LAB;  Service: Cardiovascular;  Laterality: N/A;  . ABDOMINAL AORTOGRAM N/A 10/30/2016   Procedure: Abdominal Aortogram;   Surgeon: Wellington Hampshire, MD;  Location: [place] CV LAB;  Service: Cardiovascular;  Laterality: N/A;  . APPENDECTOMY  1980  . AV FISTULA PLACEMENT Left 05/10/2016   Procedure: LEFT ARM ARTERIOVENOUS (AV) FISTULA CREATION;  Surgeon: Rosetta Posner, MD;  Location: [place];  Service: Vascular;  Laterality: Left;  . BASCILIC VEIN TRANSPOSITION Left 06/26/2016   Procedure: SECOND STAGE BASILIC VEIN TRANSPOSITION;  Surgeon: Rosetta Posner, MD;  Location: [place];  Service: Vascular;  Laterality: Left;  . BREAST CYST EXCISION Right 1970's  . CARDIAC CATHETERIZATION  2002  . CARDIAC CATHETERIZATION N/A 12/12/2014   Procedure: Left Heart Cath and Cors/Grafts Angiography;  Surgeon: Peter M Martinique, MD;  Location: [place] CV LAB;  Service: Cardiovascular;  Laterality: N/A;  . CARDIAC CATHETERIZATION N/A 12/12/2014   Procedure: Coronary Stent Intervention;  Surgeon: Peter M Martinique, MD;  Location: [place] CV LAB;  Service: Cardiovascular;  Laterality: N/A;  . CATARACT EXTRACTION, BILATERAL    . CESAREAN SECTION  1978; 1980  . CHOLECYSTECTOMY  1982  . CORONARY ANGIOPLASTY WITH STENT PLACEMENT  2004; 2012   "I have 2 stents" (08/04/2013)  . CORONARY ANGIOPLASTY WITH STENT PLACEMENT  09/28/13   PTCA/ DES Xience stent to VG-Diag   . CORONARY ARTERY BYPASS GRAFT  11/20/2000   x6 LIMA to distal LAD, svg to first diag, svg to ramus intermediate branch and swquential SVG to cir marginal branch, SVG to posterior descending coronary and sequential SVG to first right posterolateral branch  . eye injections    . INSERTION OF DIALYSIS CATHETER Right 05/10/2016   Procedure: INSERTION OF DIALYSIS CATHETER - Right Internal Jugular Placement;  Surgeon: Rosetta Posner, MD;  Location: [place];  Service: Vascular;  Laterality: Right;  . LEFT HEART CATHETERIZATION WITH CORONARY/GRAFT ANGIOGRAM N/A 09/28/2013   Procedure: LEFT HEART CATHETERIZATION WITH Beatrix Fetters;  Surgeon: Peter M Martinique, MD;  Location: [facility] CATH LAB;   Service: Cardiovascular;  Laterality: N/A;  . LOWER EXTREMITY ANGIOGRAM  12/01/2012   Procedure: LOWER EXTREMITY ANGIOGRAM;  Surgeon: Lorretta Harp, MD;  Location: [facility] CATH LAB;  Service: Cardiovascular;;  . LOWER EXTREMITY ANGIOGRAPHY Right 10/30/2016   Procedure: Lower Extremity Angiography;  Surgeon: Wellington Hampshire, MD;  Location: [place] CV LAB;  Service: Cardiovascular;  Laterality: Right;  . NM MYOCAR PERF WALL MOTION  08/27/2004   negative  . PERCUTANEOUS STENT INTERVENTION Left 12/01/2012   Procedure: PERCUTANEOUS STENT INTERVENTION;  Surgeon: Lorretta Harp, MD;  Location: [facility] CATH LAB;  Service: Cardiovascular;  Laterality: Left;  Left SFA  . PERIPHERAL ARTERIAL STENT GRAFT Left    SFA/notes 04/07/2011 (11/30/2012)  . PERIPHERAL VASCULAR ATHERECTOMY Right 10/30/2016   Procedure: Peripheral Vascular Atherectomy;  Surgeon: Wellington Hampshire, MD;  Location: [place] CV LAB;  Service: Cardiovascular;  Laterality: Right;  cancel unable to  . PERIPHERAL VASCULAR INTERVENTION Right 10/30/2016   Procedure: Peripheral Vascular Intervention;  Surgeon: Wellington Hampshire, MD;  Location: [place] CV LAB;  Service: Cardiovascular;  Laterality: Right;  SFA  . RENAL ANGIOGRAM N/A 04/05/2011   Procedure: RENAL ANGIOGRAM;  Surgeon: Lorretta Harp, MD;  Location: [facility] CATH LAB;  Service: Cardiovascular;  Laterality: N/A;  . TUBAL LIGATION  1980    Allergies  Allergen Reactions  . Digoxin And Related Diarrhea and Other (See Comments)    TOXIC DRUG LEVELS Patient stated she almost died. Had flu like symptoms as well as diarrhea.  . Hydralazine Shortness Of Breath  . Penicillins Cross Reactors Hives and Other (See Comments)    HIGH FEVER  Has patient had a PCN reaction causing immediate rash, facial/tongue/throat swelling, SOB or lightheadedness with hypotension: No Has patient had a PCN reaction causing SEVERE RASH INVOLVING MUCUS MEMBRANES or SKIN NECROSIS  #  #  #  YES  #  #  #  Has  patient had a PCN reaction that required hospitalization: Unk Has patient had a PCN reaction occurring within the last 10 years: Unk If all of the above answers are "NO", then may proceed with Cephalosporin use.   [name] Lisinopril Other (See Comments)    Felt like she had the flu. Was very sick!!!  . Adhesive [Tape] Rash and Other (See Comments)    bruising     Outpatient Medications Prior to Visit  Medication Sig Dispense Refill  . acetaminophen-codeine (TYLENOL #3) 300-30 MG tablet Take 1 tablet by mouth 2 (two) times daily as needed for moderate pain.    [name] albuterol (PROVENTIL HFA;VENTOLIN HFA) 108 (90 Base) MCG/ACT inhaler Inhale 2 puffs into the lungs every 6 (six) hours as needed for wheezing or shortness of breath. 3 Inhaler 3  . albuterol (PROVENTIL) (2.5 MG/3ML) 0.083% nebulizer solution Take 3 mLs (2.5 mg total) by nebulization every 6 (six) hours  as needed for wheezing or shortness of breath. 150 mL 3  . allopurinol (ZYLOPRIM) 300 MG tablet Take 1 tablet (300 mg total) by mouth daily. 90 tablet 3  . aspirin 81 MG chewable tablet Chew 1 tablet (81 mg total) by mouth daily. 30 tablet 10  . atorvastatin (LIPITOR) 40 MG tablet Take 1 tablet (40 mg total) by mouth every evening. 30 tablet 3  . cadexomer iodine (IODOSORB) 0.9 % gel Apply 1 application topically daily as needed for wound care. 40 g 0  . citalopram (CELEXA) 20 MG tablet Take 1 tablet (20 mg total) by mouth daily. (Patient taking differently: Take 20 mg by mouth at bedtime. ) 30 tablet 3  . clopidogrel (PLAVIX) 75 MG tablet take 1 tablet by mouth once daily (Patient taking differently: take 75 mg by mouth at bedtime) 90 tablet 3  . ezetimibe (ZETIA) 10 MG tablet Take 1 tablet (10 mg total) by mouth daily. 90 tablet 3  . ferric gluconate 125 mg in sodium chloride 0.9 % 100 mL Inject 125 mg into the vein Every Tuesday,Thursday,and Saturday with dialysis.    . fluticasone (FLONASE) 50 MCG/ACT nasal spray Place 2 sprays into both  nostrils daily as needed for allergies or rhinitis. 16 g 3  . gabapentin (NEURONTIN) 400 MG capsule Take 400 mg by mouth at bedtime.     . hydrOXYzine (ATARAX/VISTARIL) 25 MG tablet take 1 tablet by mouth three times a day (Patient taking differently: take 25 mg by mouth three times a day) 90 tablet 0  . insulin aspart (NOVOLOG) 100 UNIT/ML injection Inject 25-30 Units into the skin 3 (three) times daily before meals. Pens (Patient taking differently: Inject 25-30 Units into the skin 3 (three) times daily before meals. Per sliding scale) 15 mL 5  . Insulin Glargine (BASAGLAR KWIKPEN) 100 UNIT/ML SOPN INJECT 25 UNITS INTO THE SKIN AT BEDTIME. 9 mL 0  . isosorbide mononitrate (IMDUR) 60 MG 24 hr tablet Take 30 mg by mouth 2 (two) times daily.     [name] levothyroxine (SYNTHROID, LEVOTHROID) 25 MCG tablet take 1 tablet by mouth every morning BEFORE BREAKFAST 30 tablet 2  . lidocaine-prilocaine (EMLA) cream Apply 1 application topically as directed.  1  . loratadine (CLARITIN) 10 MG tablet Take 10 mg by mouth daily.    . metoprolol tartrate (LOPRESSOR) 25 MG tablet Take 0.5 tablets (12.5 mg total) by mouth 2 (two) times daily. 90 tablet 3  . Naphazoline HCl (CLEAR EYES OP) Place 1 drop into both eyes as needed (for dry eyes).    . nitroGLYCERIN (NITROSTAT) 0.4 MG SL tablet Place 1 tablet (0.4 mg total) under the tongue every 5 (five) minutes as needed. (Patient taking differently: Place 0.4 mg under the tongue every 5 (five) minutes as needed for chest pain. ) 30 tablet 2  . prednisoLONE acetate (PRED FORTE) 1 % ophthalmic suspension Place 1 drop into both eyes 2 (two) times daily.   1  . ranitidine (ZANTAC 75) 75 MG tablet Take 1 tablet (75 mg total) by mouth daily as needed for heartburn.    . senna-docusate (SENOKOT-S) 8.6-50 MG tablet Take 1 tablet by mouth at bedtime as needed for mild constipation. 30 tablet 0  . sevelamer (RENAGEL) 800 MG tablet Take 2,400 mg by mouth 3 (three) times daily with meals.     . triamcinolone ointment (KENALOG) 0.5 % Apply 1 application topically daily.    . traMADol (ULTRAM) 50 MG tablet Take 1 tablet (50 mg  total) by mouth every 12 (twelve) hours as needed. 30 tablet 0   No facility-administered medications prior to visit.     ROS Review of Systems Constitutional: Negative for activity change, appetite change and fatigue.  HENT: Negative for congestion, sinus pressure and sore throat.   Eyes: Negative for visual disturbance.  Respiratory: Negative for cough, chest tightness, shortness of breath and wheezing.   Cardiovascular: Negative for chest pain and palpitations.  Gastrointestinal: Negative for abdominal distention, abdominal pain and constipation.  Endocrine: Negative for polydipsia.  Genitourinary: Negative for dysuria and frequency.  Musculoskeletal: Positive for leg pain Skin:       Skin ulcer  Neurological: Negative for tremors, light-headedness and numbness.  Hematological: Does not bruise/bleed easily.  Psychiatric/Behavioral: Negative for agitation and behavioral problems.   Objective:  BP 126/72 (BP Location: Right Arm, Patient Position: Sitting, Cuff Size: Normal)   Pulse 77   Temp 99 F (37.2 C) (Oral)   Resp 18   Ht 5\' 10"  (1.778 m)   Wt 224 lb (101.6 kg)   LMP 10/13/2014 (Exact Date)   SpO2 98%   BMI 32.14 kg/m   BP/Weight 11/12/2016 10/31/2016 04/29/7260  Systolic BP 035 597 -  Diastolic BP 72 67 -  Wt. (Lbs) 224 218.7 -  BMI 32.14 - 30.94      Physical Exam Constitutional: She is oriented to person, place, and time. She appears well-developed and well-nourished.  HEENT: Normal tympanic membrane, normal oropharynx Cardiovascular: Normal rate, normal heart sounds, unable to palpate dorsalis pedis bilaterally   No murmur heard. Pulmonary/Chest: Effort normal and breath sounds normal. She has no wheezes. She has no rales. She exhibits no tenderness.  Right upper chest wall permacath  Breast: Normal appearance, no  tenderness, no masses Abdominal: Soft. Bowel sounds are normal. She exhibits no distension and no mass. There is no tenderness.  Musculoskeletal: Normal range of motion.  Left AV fistula not fully mature with palpable thrill  Genitourinary: Normal external genitalia, normal vagina. Hemorrhagic appearing pedunculated polyp visible through the cervical os, normal adnexa  Neurological: She is alert and oriented to person, place, and time.  Skin:  Ulcer on right big toe Extensive linear surgical scar on medial aspect of right lower extremity Psych: Normal  Assessment & Plan:   1. Annual physical exam Up-to-date on colonoscopy  2. Screening for cervical cancer Presence of ?cervical polyp - she is asymptomatic Will follow up on Pap smear report; if negative will consider pelvic ultrasound to further characterize polyp. - Cytology - PAP [place]  3. Screening for breast cancer - MM Digital Screening; Future  4. Screening for viral disease - Hepatitis c antibody (reflex)  5. Pain of right lower extremity Secondary to peripheral vascular disease Status post recent right SFA intervention for critical limb ischemia. - traMADol (ULTRAM) 50 MG tablet; Take 1 tablet (50 mg total) by mouth every 12 (twelve) hours as needed.  Dispense: 40 tablet; Refill: 1   Meds ordered this encounter  Medications  . traMADol (ULTRAM) 50 MG tablet    Sig: Take 1 tablet (50 mg total) by mouth every 12 (twelve) hours as needed.    Dispense:  40 tablet    Refill:  1    Follow-up: Return in about 3 months (around 02/12/2017) for Follow-up on chronic medical conditions.   Arnoldo Morale MD

## 2016-11-12 NOTE — Patient Instructions (Signed)

## 2016-11-12 NOTE — Progress Notes (Signed)
Patient has eaten Patient has had medication

## 2016-11-13 DIAGNOSIS — D509 Iron deficiency anemia, unspecified: Secondary | ICD-10-CM | POA: Diagnosis not present

## 2016-11-13 DIAGNOSIS — N2581 Secondary hyperparathyroidism of renal origin: Secondary | ICD-10-CM | POA: Diagnosis not present

## 2016-11-13 DIAGNOSIS — R509 Fever, unspecified: Secondary | ICD-10-CM | POA: Diagnosis not present

## 2016-11-13 DIAGNOSIS — Z23 Encounter for immunization: Secondary | ICD-10-CM | POA: Diagnosis not present

## 2016-11-13 DIAGNOSIS — D631 Anemia in chronic kidney disease: Secondary | ICD-10-CM | POA: Diagnosis not present

## 2016-11-13 DIAGNOSIS — N186 End stage renal disease: Secondary | ICD-10-CM | POA: Diagnosis not present

## 2016-11-13 DIAGNOSIS — E1129 Type 2 diabetes mellitus with other diabetic kidney complication: Secondary | ICD-10-CM | POA: Diagnosis not present

## 2016-11-13 LAB — HCV COMMENT:

## 2016-11-13 LAB — HEPATITIS C ANTIBODY (REFLEX): HCV AB: 0.1 {s_co_ratio} (ref 0.0–0.9)

## 2016-11-14 ENCOUNTER — Encounter (HOSPITAL_COMMUNITY): Payer: Medicare Other

## 2016-11-15 ENCOUNTER — Telehealth: Payer: Self-pay | Admitting: Family Medicine

## 2016-11-15 DIAGNOSIS — D631 Anemia in chronic kidney disease: Secondary | ICD-10-CM | POA: Diagnosis not present

## 2016-11-15 DIAGNOSIS — E1129 Type 2 diabetes mellitus with other diabetic kidney complication: Secondary | ICD-10-CM | POA: Diagnosis not present

## 2016-11-15 DIAGNOSIS — R509 Fever, unspecified: Secondary | ICD-10-CM | POA: Diagnosis not present

## 2016-11-15 DIAGNOSIS — N186 End stage renal disease: Secondary | ICD-10-CM | POA: Diagnosis not present

## 2016-11-15 DIAGNOSIS — N2581 Secondary hyperparathyroidism of renal origin: Secondary | ICD-10-CM | POA: Diagnosis not present

## 2016-11-15 DIAGNOSIS — D509 Iron deficiency anemia, unspecified: Secondary | ICD-10-CM | POA: Diagnosis not present

## 2016-11-15 LAB — CYTOLOGY - PAP
Diagnosis: NEGATIVE
HPV (WINDOPATH): NOT DETECTED

## 2016-11-15 NOTE — Telephone Encounter (Signed)
Pt called requesting lab results , please f/up °

## 2016-11-17 ENCOUNTER — Other Ambulatory Visit: Payer: Self-pay | Admitting: Internal Medicine

## 2016-11-18 DIAGNOSIS — N2581 Secondary hyperparathyroidism of renal origin: Secondary | ICD-10-CM | POA: Diagnosis not present

## 2016-11-18 DIAGNOSIS — D631 Anemia in chronic kidney disease: Secondary | ICD-10-CM | POA: Diagnosis not present

## 2016-11-18 DIAGNOSIS — E1129 Type 2 diabetes mellitus with other diabetic kidney complication: Secondary | ICD-10-CM | POA: Diagnosis not present

## 2016-11-18 DIAGNOSIS — N186 End stage renal disease: Secondary | ICD-10-CM | POA: Diagnosis not present

## 2016-11-18 DIAGNOSIS — R509 Fever, unspecified: Secondary | ICD-10-CM | POA: Diagnosis not present

## 2016-11-18 DIAGNOSIS — D509 Iron deficiency anemia, unspecified: Secondary | ICD-10-CM | POA: Diagnosis not present

## 2016-11-18 NOTE — Telephone Encounter (Signed)
Pt. Called requesting her pap results. Please f/u

## 2016-11-19 ENCOUNTER — Ambulatory Visit: Payer: Medicare Other | Admitting: Internal Medicine

## 2016-11-19 ENCOUNTER — Other Ambulatory Visit: Payer: Self-pay | Admitting: Family Medicine

## 2016-11-19 DIAGNOSIS — Z1231 Encounter for screening mammogram for malignant neoplasm of breast: Secondary | ICD-10-CM

## 2016-11-19 NOTE — Telephone Encounter (Signed)
Patient verified DOB Patient is aware of PAP being normal and Hep C being negative. No further questions at this time.

## 2016-11-20 DIAGNOSIS — D631 Anemia in chronic kidney disease: Secondary | ICD-10-CM | POA: Diagnosis not present

## 2016-11-20 DIAGNOSIS — R509 Fever, unspecified: Secondary | ICD-10-CM | POA: Diagnosis not present

## 2016-11-20 DIAGNOSIS — N186 End stage renal disease: Secondary | ICD-10-CM | POA: Diagnosis not present

## 2016-11-20 DIAGNOSIS — N2581 Secondary hyperparathyroidism of renal origin: Secondary | ICD-10-CM | POA: Diagnosis not present

## 2016-11-20 DIAGNOSIS — E1129 Type 2 diabetes mellitus with other diabetic kidney complication: Secondary | ICD-10-CM | POA: Diagnosis not present

## 2016-11-20 DIAGNOSIS — D509 Iron deficiency anemia, unspecified: Secondary | ICD-10-CM | POA: Diagnosis not present

## 2016-11-21 ENCOUNTER — Ambulatory Visit (HOSPITAL_COMMUNITY)
Admission: RE | Admit: 2016-11-21 | Discharge: 2016-11-21 | Disposition: A | Discharge status: Home / Self Care | Payer: Medicare Other | Source: Ambulatory Visit | Attending: Internal Medicine | Admitting: Internal Medicine

## 2016-11-21 DIAGNOSIS — I739 Peripheral vascular disease, unspecified: Secondary | ICD-10-CM | POA: Diagnosis not present

## 2016-11-22 DIAGNOSIS — D509 Iron deficiency anemia, unspecified: Secondary | ICD-10-CM | POA: Diagnosis not present

## 2016-11-22 DIAGNOSIS — D631 Anemia in chronic kidney disease: Secondary | ICD-10-CM | POA: Diagnosis not present

## 2016-11-22 DIAGNOSIS — R509 Fever, unspecified: Secondary | ICD-10-CM | POA: Diagnosis not present

## 2016-11-22 DIAGNOSIS — N186 End stage renal disease: Secondary | ICD-10-CM | POA: Diagnosis not present

## 2016-11-22 DIAGNOSIS — N2581 Secondary hyperparathyroidism of renal origin: Secondary | ICD-10-CM | POA: Diagnosis not present

## 2016-11-22 DIAGNOSIS — E1129 Type 2 diabetes mellitus with other diabetic kidney complication: Secondary | ICD-10-CM | POA: Diagnosis not present

## 2016-11-25 DIAGNOSIS — R509 Fever, unspecified: Secondary | ICD-10-CM | POA: Diagnosis not present

## 2016-11-25 DIAGNOSIS — N186 End stage renal disease: Secondary | ICD-10-CM | POA: Diagnosis not present

## 2016-11-25 DIAGNOSIS — N2581 Secondary hyperparathyroidism of renal origin: Secondary | ICD-10-CM | POA: Diagnosis not present

## 2016-11-25 DIAGNOSIS — D631 Anemia in chronic kidney disease: Secondary | ICD-10-CM | POA: Diagnosis not present

## 2016-11-25 DIAGNOSIS — D509 Iron deficiency anemia, unspecified: Secondary | ICD-10-CM | POA: Diagnosis not present

## 2016-11-25 DIAGNOSIS — E1129 Type 2 diabetes mellitus with other diabetic kidney complication: Secondary | ICD-10-CM | POA: Diagnosis not present

## 2016-11-26 ENCOUNTER — Ambulatory Visit: Payer: Medicare Other | Admitting: Cardiovascular Disease

## 2016-11-27 DIAGNOSIS — D631 Anemia in chronic kidney disease: Secondary | ICD-10-CM | POA: Diagnosis not present

## 2016-11-27 DIAGNOSIS — N186 End stage renal disease: Secondary | ICD-10-CM | POA: Diagnosis not present

## 2016-11-27 DIAGNOSIS — R509 Fever, unspecified: Secondary | ICD-10-CM | POA: Diagnosis not present

## 2016-11-27 DIAGNOSIS — E1129 Type 2 diabetes mellitus with other diabetic kidney complication: Secondary | ICD-10-CM | POA: Diagnosis not present

## 2016-11-27 DIAGNOSIS — D509 Iron deficiency anemia, unspecified: Secondary | ICD-10-CM | POA: Diagnosis not present

## 2016-11-27 DIAGNOSIS — N2581 Secondary hyperparathyroidism of renal origin: Secondary | ICD-10-CM | POA: Diagnosis not present

## 2016-11-28 ENCOUNTER — Ambulatory Visit (INDEPENDENT_AMBULATORY_CARE_PROVIDER_SITE_OTHER): Payer: Medicare Other | Admitting: Internal Medicine

## 2016-11-28 ENCOUNTER — Encounter: Payer: Self-pay | Admitting: Internal Medicine

## 2016-11-28 VITALS — BP 134/62 | HR 84 | Wt 223.0 lb

## 2016-11-28 DIAGNOSIS — I2510 Atherosclerotic heart disease of native coronary artery without angina pectoris: Secondary | ICD-10-CM | POA: Diagnosis not present

## 2016-11-28 DIAGNOSIS — Z9861 Coronary angioplasty status: Secondary | ICD-10-CM

## 2016-11-28 DIAGNOSIS — Z794 Long term (current) use of insulin: Secondary | ICD-10-CM

## 2016-11-28 DIAGNOSIS — L97519 Non-pressure chronic ulcer of other part of right foot with unspecified severity: Secondary | ICD-10-CM | POA: Diagnosis not present

## 2016-11-28 DIAGNOSIS — E0851 Diabetes mellitus due to underlying condition with diabetic peripheral angiopathy without gangrene: Secondary | ICD-10-CM

## 2016-11-28 LAB — POCT GLYCOSYLATED HEMOGLOBIN (HGB A1C): Hemoglobin A1C: 6.2

## 2016-11-28 MED ORDER — BASAGLAR KWIKPEN 100 UNIT/ML ~~LOC~~ SOPN: PEN_INJECTOR | SUBCUTANEOUS | 3 refills | Status: DC
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
## 2016-11-28 NOTE — Patient Instructions (Addendum)
Please continue: - Lantus 28 units at bedtime  Please decrease: - Novolog 15-25 units before meals If sugars before a meal are 80-150: take the whole amount of mealtime insulin with that meal, but not the Sliding scale If sugars are 60-80, take only half of the mealtime insulin and no sliding scale. If sugars <60, do not take any insulin with that meal. - NovoLog SSI: - 150- 165: + 1 unit  - 166- 180: + 2 units  - 181- 195: + 3 units  - 196- 210: + 4 units  - >210: + 5 units  Please return in 3 months with your sugar log.

## 2016-11-29 ENCOUNTER — Emergency Department (HOSPITAL_COMMUNITY)
Admission: EM | Admit: 2016-11-29 | Discharge: 2016-11-29 | Disposition: A | Discharge status: Home / Self Care | Payer: Medicare Other | Attending: Emergency Medicine | Admitting: Emergency Medicine

## 2016-11-29 ENCOUNTER — Emergency Department (HOSPITAL_COMMUNITY): Payer: Medicare Other

## 2016-11-29 ENCOUNTER — Encounter (HOSPITAL_COMMUNITY): Payer: Self-pay | Admitting: Emergency Medicine

## 2016-11-29 DIAGNOSIS — N2581 Secondary hyperparathyroidism of renal origin: Secondary | ICD-10-CM | POA: Diagnosis not present

## 2016-11-29 DIAGNOSIS — D509 Iron deficiency anemia, unspecified: Secondary | ICD-10-CM | POA: Diagnosis not present

## 2016-11-29 DIAGNOSIS — Z5321 Procedure and treatment not carried out due to patient leaving prior to being seen by health care provider: Secondary | ICD-10-CM | POA: Insufficient documentation

## 2016-11-29 DIAGNOSIS — R509 Fever, unspecified: Secondary | ICD-10-CM | POA: Diagnosis not present

## 2016-11-29 DIAGNOSIS — R079 Chest pain, unspecified: Secondary | ICD-10-CM | POA: Diagnosis not present

## 2016-11-29 DIAGNOSIS — D631 Anemia in chronic kidney disease: Secondary | ICD-10-CM | POA: Diagnosis not present

## 2016-11-29 DIAGNOSIS — E1129 Type 2 diabetes mellitus with other diabetic kidney complication: Secondary | ICD-10-CM | POA: Diagnosis not present

## 2016-11-29 DIAGNOSIS — R05 Cough: Secondary | ICD-10-CM | POA: Diagnosis not present

## 2016-11-29 DIAGNOSIS — N186 End stage renal disease: Secondary | ICD-10-CM | POA: Diagnosis not present

## 2016-11-29 LAB — I-STAT TROPONIN, ED: Troponin i, poc: 0 ng/mL (ref 0.00–0.08)

## 2016-11-29 LAB — CBC
HEMATOCRIT: 31.6 % — AB (ref 36.0–46.0)
HEMOGLOBIN: 10.5 g/dL — AB (ref 12.0–15.0)
MCH: 32.4 pg (ref 26.0–34.0)
MCHC: 33.2 g/dL (ref 30.0–36.0)
MCV: 97.5 fL (ref 78.0–100.0)
Platelets: 198 10*3/uL (ref 150–400)
RBC: 3.24 MIL/uL — ABNORMAL LOW (ref 3.87–5.11)
RDW: 13.6 % (ref 11.5–15.5)
WBC: 10.2 10*3/uL (ref 4.0–10.5)

## 2016-11-29 LAB — BASIC METABOLIC PANEL
Anion gap: 13 (ref 5–15)
BUN: 16 mg/dL (ref 6–20)
CO2: 29 mmol/L (ref 22–32)
Calcium: 9.2 mg/dL (ref 8.9–10.3)
Chloride: 91 mmol/L — ABNORMAL LOW (ref 101–111)
Creatinine, Ser: 4.21 mg/dL — ABNORMAL HIGH (ref 0.44–1.00)
GFR calc Af Amer: 12 mL/min — ABNORMAL LOW (ref >60)
GFR, EST NON AFRICAN AMERICAN: 10 mL/min — AB (ref >60)
GLUCOSE: 169 mg/dL — AB (ref 65–99)
POTASSIUM: 3.6 mmol/L (ref 3.5–5.1)
SODIUM: 133 mmol/L — AB (ref 135–145)

## 2016-11-29 NOTE — ED Notes (Signed)
Patient up to desk.  Delays explained.  Patient states she is feeling better and would like to leave.  This RN encouraged patient to stay.  Patient agreed.  Patient then back up to desk, states she has changed her mind and that her ride was here and she was leaving.  This RN reviewed return precautions, and encouraged patient to return with any increasing concerns.

## 2016-11-29 NOTE — ED Triage Notes (Signed)
Pt. States chest pain started yesterday evening. Pt. Describes it as sharpe in the middle of chest. Pt. Is hdwmwf completed treatment today but didn't feel well. Pt. Reports SHOB, diaphoretic. Pt. Denies N/V.

## 2016-12-02 DIAGNOSIS — R509 Fever, unspecified: Secondary | ICD-10-CM | POA: Diagnosis not present

## 2016-12-02 DIAGNOSIS — N186 End stage renal disease: Secondary | ICD-10-CM | POA: Diagnosis not present

## 2016-12-02 DIAGNOSIS — E1129 Type 2 diabetes mellitus with other diabetic kidney complication: Secondary | ICD-10-CM | POA: Diagnosis not present

## 2016-12-02 DIAGNOSIS — N2581 Secondary hyperparathyroidism of renal origin: Secondary | ICD-10-CM | POA: Diagnosis not present

## 2016-12-02 DIAGNOSIS — D631 Anemia in chronic kidney disease: Secondary | ICD-10-CM | POA: Diagnosis not present

## 2016-12-02 DIAGNOSIS — D509 Iron deficiency anemia, unspecified: Secondary | ICD-10-CM | POA: Diagnosis not present

## 2016-12-03 ENCOUNTER — Ambulatory Visit: Payer: Medicare Other

## 2016-12-03 DIAGNOSIS — Z961 Presence of intraocular lens: Secondary | ICD-10-CM | POA: Diagnosis not present

## 2016-12-03 DIAGNOSIS — E113412 Type 2 diabetes mellitus with severe nonproliferative diabetic retinopathy with macular edema, left eye: Secondary | ICD-10-CM | POA: Diagnosis not present

## 2016-12-03 DIAGNOSIS — H3561 Retinal hemorrhage, right eye: Secondary | ICD-10-CM | POA: Diagnosis not present

## 2016-12-03 DIAGNOSIS — E113411 Type 2 diabetes mellitus with severe nonproliferative diabetic retinopathy with macular edema, right eye: Secondary | ICD-10-CM | POA: Diagnosis not present

## 2016-12-04 DIAGNOSIS — E1129 Type 2 diabetes mellitus with other diabetic kidney complication: Secondary | ICD-10-CM | POA: Diagnosis not present

## 2016-12-04 DIAGNOSIS — N2581 Secondary hyperparathyroidism of renal origin: Secondary | ICD-10-CM | POA: Diagnosis not present

## 2016-12-04 DIAGNOSIS — R509 Fever, unspecified: Secondary | ICD-10-CM | POA: Diagnosis not present

## 2016-12-04 DIAGNOSIS — D631 Anemia in chronic kidney disease: Secondary | ICD-10-CM | POA: Diagnosis not present

## 2016-12-04 DIAGNOSIS — D509 Iron deficiency anemia, unspecified: Secondary | ICD-10-CM | POA: Diagnosis not present

## 2016-12-04 DIAGNOSIS — N186 End stage renal disease: Secondary | ICD-10-CM | POA: Diagnosis not present

## 2016-12-05 ENCOUNTER — Ambulatory Visit: Payer: Medicare Other

## 2016-12-06 DIAGNOSIS — N2581 Secondary hyperparathyroidism of renal origin: Secondary | ICD-10-CM | POA: Diagnosis not present

## 2016-12-06 DIAGNOSIS — R509 Fever, unspecified: Secondary | ICD-10-CM | POA: Diagnosis not present

## 2016-12-06 DIAGNOSIS — D509 Iron deficiency anemia, unspecified: Secondary | ICD-10-CM | POA: Diagnosis not present

## 2016-12-06 DIAGNOSIS — D631 Anemia in chronic kidney disease: Secondary | ICD-10-CM | POA: Diagnosis not present

## 2016-12-06 DIAGNOSIS — N186 End stage renal disease: Secondary | ICD-10-CM | POA: Diagnosis not present

## 2016-12-06 DIAGNOSIS — E1129 Type 2 diabetes mellitus with other diabetic kidney complication: Secondary | ICD-10-CM | POA: Diagnosis not present

## 2016-12-09 DIAGNOSIS — D631 Anemia in chronic kidney disease: Secondary | ICD-10-CM | POA: Diagnosis not present

## 2016-12-09 DIAGNOSIS — E1129 Type 2 diabetes mellitus with other diabetic kidney complication: Secondary | ICD-10-CM | POA: Diagnosis not present

## 2016-12-09 DIAGNOSIS — D509 Iron deficiency anemia, unspecified: Secondary | ICD-10-CM | POA: Diagnosis not present

## 2016-12-09 DIAGNOSIS — R509 Fever, unspecified: Secondary | ICD-10-CM | POA: Diagnosis not present

## 2016-12-09 DIAGNOSIS — N186 End stage renal disease: Secondary | ICD-10-CM | POA: Diagnosis not present

## 2016-12-09 DIAGNOSIS — N2581 Secondary hyperparathyroidism of renal origin: Secondary | ICD-10-CM | POA: Diagnosis not present

## 2016-12-10 ENCOUNTER — Ambulatory Visit (INDEPENDENT_AMBULATORY_CARE_PROVIDER_SITE_OTHER): Payer: Medicare Other | Admitting: Sports Medicine

## 2016-12-10 ENCOUNTER — Encounter: Payer: Self-pay | Admitting: Sports Medicine

## 2016-12-10 VITALS — BP 108/59 | HR 74 | Resp 16

## 2016-12-10 DIAGNOSIS — E1142 Type 2 diabetes mellitus with diabetic polyneuropathy: Secondary | ICD-10-CM | POA: Diagnosis not present

## 2016-12-10 DIAGNOSIS — M79674 Pain in right toe(s): Secondary | ICD-10-CM

## 2016-12-10 DIAGNOSIS — R52 Pain, unspecified: Secondary | ICD-10-CM

## 2016-12-10 DIAGNOSIS — E11621 Type 2 diabetes mellitus with foot ulcer: Secondary | ICD-10-CM

## 2016-12-10 DIAGNOSIS — M79676 Pain in unspecified toe(s): Secondary | ICD-10-CM

## 2016-12-10 DIAGNOSIS — B351 Tinea unguium: Secondary | ICD-10-CM | POA: Diagnosis not present

## 2016-12-10 DIAGNOSIS — L97519 Non-pressure chronic ulcer of other part of right foot with unspecified severity: Principal | ICD-10-CM

## 2016-12-10 DIAGNOSIS — M79675 Pain in left toe(s): Secondary | ICD-10-CM

## 2016-12-10 DIAGNOSIS — I739 Peripheral vascular disease, unspecified: Secondary | ICD-10-CM

## 2016-12-10 NOTE — Progress Notes (Signed)
Subjective: Brooke Stafford is a 61 y.o. female patient seen in office for follow up evaluation of ulceration of the right great toe. Patient has a history of diabetes and a blood glucose level not recorded. Reports that she had vascular intervention with Dr. Fletcher Anon and that things are better and will have left leg done soon. Patient states that she desires to have her nails trimmed today as well. Patient has no other pedal complaints at this time.  FBS this AM 132, last A1c 6.2  Patient is also a dialysis patient and is on Plavix.   Patient Active Problem List   Diagnosis Date Noted  . Foot ulcer, right ([place]) 11/28/2016  . Cervical polyp 11/12/2016  . Critical lower limb ischemia 10/30/2016  . AV (arteriovenous fistula) ([place]) 06/17/2016  . Mixed hyperlipidemia 05/21/2016  . ESRD (end stage renal disease) ([place])   . Hypokalemia 05/03/2016  . COPD exacerbation ([place]) 05/03/2016  . COPD (chronic obstructive pulmonary disease) ([place]) 04/11/2016  . CAP (community acquired pneumonia) 03/26/2016  . Acute respiratory failure with hypoxia ([place]) 03/26/2016  . Acute on chronic combined systolic and diastolic CHF (congestive heart failure) ([place]) 03/26/2016  . Chronic kidney disease (CKD), stage IV (severe) ([place]) 03/26/2016  . Medication management 03/21/2016  . Anemia of chronic disease 12/27/2015  . Acute on chronic systolic CHF (congestive heart failure) ([place])   . Acute CHF ([place]) 12/18/2015  . Hypertensive heart and renal disease with CHF and ESRD ([place]) 12/18/2015  . Constipation 12/18/2015  . Adjustment disorder with anxious mood 11/02/2015  . Posterior circulation stroke ([place]) 11/02/2015  . Abnormality of gait 10/23/2015  . Depression 09/28/2015  . Diabetes mellitus due to underlying condition with diabetic peripheral angiopathy without gangrene, with long-term current use of insulin ([place]) 09/13/2015  . Diabetic retinopathy ([place]) 09/13/2015  . Grief reaction 08/24/2015  . Acute kidney injury  superimposed on chronic kidney disease ([place]) 07/25/2015  . Creatinine elevation 07/25/2015  . Chronic combined systolic and diastolic congestive heart failure ([place]) 07/25/2015  . Rash of hands 02/23/2015  . Essential hypertension   . Angina pectoris ([place]) 12/12/2014  . Abnormal nuclear stress test   . Acute combined systolic and diastolic heart failure ([place]) 12/02/2014  . Seasonal allergies 08/11/2014  . Gout 08/11/2014  . Encounter for screening mammogram for breast cancer 05/16/2014  . Screening for colon cancer 05/16/2014  . Diabetic neuropathy, type II diabetes mellitus ([place]) 01/27/2014  . Atopic eczema 01/27/2014  . Precordial pain, atypical, negative MI, Musculature Skeletal pain  11/08/2013  . CKD (chronic kidney disease) stage 5, GFR less than 15 ml/min (HCC) 11/08/2013  . Unstable angina ([place]) 09/28/2013  . Ischemic cardiomyopathy- new drop in EF 08/30/2013  . Chest pain 08/04/2013  . CAD -S/P PCI June 2015 and 12/14/14 02/01/2013  . Hypothyroidism, acquired 02/01/2013  . PVD, LSFA PTA 12/12 04/06/2011  . Hx of CABG x 6 2002 04/06/2011  . Dyslipidemia 04/06/2011   Current Outpatient Prescriptions on File Prior to Visit  Medication Sig Dispense Refill  . acetaminophen-codeine (TYLENOL #3) 300-30 MG tablet Take 1 tablet by mouth 2 (two) times daily as needed for moderate pain.    [name] albuterol (PROVENTIL HFA;VENTOLIN HFA) 108 (90 Base) MCG/ACT inhaler Inhale 2 puffs into the lungs every 6 (six) hours as needed for wheezing or shortness of breath. 3 Inhaler 3  . albuterol (PROVENTIL) (2.5 MG/3ML) 0.083% nebulizer solution Take 3 mLs (2.5 mg total) by nebulization every 6 (six) hours as needed for wheezing  or shortness of breath. 150 mL 3  . allopurinol (ZYLOPRIM) 300 MG tablet Take 1 tablet (300 mg total) by mouth daily. 90 tablet 3  . aspirin 81 MG chewable tablet Chew 1 tablet (81 mg total) by mouth daily. 30 tablet 10  . atorvastatin (LIPITOR) 40 MG tablet Take 1 tablet (40 mg  total) by mouth every evening. 30 tablet 3  . cadexomer iodine (IODOSORB) 0.9 % gel Apply 1 application topically daily as needed for wound care. 40 g 0  . citalopram (CELEXA) 20 MG tablet Take 1 tablet (20 mg total) by mouth daily. (Patient taking differently: Take 20 mg by mouth at bedtime. ) 30 tablet 3  . clopidogrel (PLAVIX) 75 MG tablet take 1 tablet by mouth once daily (Patient taking differently: take 75 mg by mouth at bedtime) 90 tablet 3  . ezetimibe (ZETIA) 10 MG tablet Take 1 tablet (10 mg total) by mouth daily. 90 tablet 3  . ferric gluconate 125 mg in sodium chloride 0.9 % 100 mL Inject 125 mg into the vein Every Tuesday,Thursday,and Saturday with dialysis.    . fluticasone (FLONASE) 50 MCG/ACT nasal spray Place 2 sprays into both nostrils daily as needed for allergies or rhinitis. 16 g 3  . gabapentin (NEURONTIN) 400 MG capsule Take 400 mg by mouth at bedtime.     . hydrOXYzine (ATARAX/VISTARIL) 25 MG tablet take 1 tablet by mouth three times a day (Patient taking differently: take 25 mg by mouth three times a day) 90 tablet 0  . insulin aspart (NOVOLOG) 100 UNIT/ML injection Inject 25-30 Units into the skin 3 (three) times daily before meals. Pens (Patient taking differently: Inject 25-30 Units into the skin 3 (three) times daily before meals. Per sliding scale) 15 mL 5  . Insulin Glargine (BASAGLAR KWIKPEN) 100 UNIT/ML SOPN INJECT 28 UNITS INTO THE SKIN AT BEDTIME 45 mL 3  . isosorbide mononitrate (IMDUR) 60 MG 24 hr tablet Take 30 mg by mouth 2 (two) times daily.     [name] levothyroxine (SYNTHROID, LEVOTHROID) 25 MCG tablet take 1 tablet by mouth every morning BEFORE BREAKFAST 30 tablet 2  . lidocaine-prilocaine (EMLA) cream Apply 1 application topically as directed.  1  . loratadine (CLARITIN) 10 MG tablet Take 10 mg by mouth daily.    . metoprolol tartrate (LOPRESSOR) 25 MG tablet Take 0.5 tablets (12.5 mg total) by mouth 2 (two) times daily. 90 tablet 3  . Naphazoline HCl (CLEAR EYES  OP) Place 1 drop into both eyes as needed (for dry eyes).    . nitroGLYCERIN (NITROSTAT) 0.4 MG SL tablet Place 1 tablet (0.4 mg total) under the tongue every 5 (five) minutes as needed. (Patient taking differently: Place 0.4 mg under the tongue every 5 (five) minutes as needed for chest pain. ) 30 tablet 2  . ranitidine (ZANTAC 75) 75 MG tablet Take 1 tablet (75 mg total) by mouth daily as needed for heartburn.    . senna-docusate (SENOKOT-S) 8.6-50 MG tablet Take 1 tablet by mouth at bedtime as needed for mild constipation. 30 tablet 0  . sevelamer (RENAGEL) 800 MG tablet Take 2,400 mg by mouth 3 (three) times daily with meals.    . traMADol (ULTRAM) 50 MG tablet Take 1 tablet (50 mg total) by mouth every 12 (twelve) hours as needed. 40 tablet 1  . triamcinolone ointment (KENALOG) 0.5 % Apply 1 application topically daily.     No current facility-administered medications on file prior to visit.    Allergies  Allergen Reactions  . Digoxin And Related Diarrhea and Other (See Comments)    TOXIC DRUG LEVELS Patient stated she almost died. Had flu like symptoms as well as diarrhea.  . Hydralazine Shortness Of Breath  . Penicillins Cross Reactors Hives and Other (See Comments)    HIGH FEVER  Has patient had a PCN reaction causing immediate rash, facial/tongue/throat swelling, SOB or lightheadedness with hypotension: No Has patient had a PCN reaction causing SEVERE RASH INVOLVING MUCUS MEMBRANES or SKIN NECROSIS  #  #  #  YES  #  #  #  Has patient had a PCN reaction that required hospitalization: Unk Has patient had a PCN reaction occurring within the last 10 years: Unk If all of the above answers are "NO", then may proceed with Cephalosporin use.   [name] Lisinopril Other (See Comments)    Felt like she had the flu. Was very sick!!!  . Adhesive [Tape] Rash and Other (See Comments)    bruising   Objective: There were no vitals filed for this visit.   General: Patient is awake, alert, oriented  x 3 and in no acute distress.  Dermatology: Skin is warm and dry bilateral with a now healed ulceration  at right great toe. Ulceration resolved with no underlying opening in skin once callus was debrided. No other acute signs of infection. Nails are elongated and mycotic.    Vascular: Dorsalis Pedis pulse = 1/4 Bilateral,  Posterior Tibial pulse = 0/4 Bilateral,  Capillary Fill Time < 5 seconds  Neurologic: Protective sensation absent bilateral.  Musculosketal:  No pain with palpation to previous ulcerated area. No pain with compression to calves bilateral. Asymptomatic hammertoe bony deformities noted bilateral.  Recent Labs  05/04/16 [place] WBC PRESENT,BOTH PMN AND MONONUCLEARNO ORGANISMS SEEN    Assessment and Plan:  Problem List Items Addressed This Visit    None    Visit Diagnoses    Ulcer of right great toe due to diabetes mellitus ([place])    -  Primary   healed   Pain due to onychomycosis of toenails of both feet       Diabetic polyneuropathy associated with type 2 diabetes mellitus (HCC)       PAD (peripheral artery disease) (HCC)       Pain         -Examined patient and discussed the progression of the healed wound and treatment alternatives. - Excisionally dedbrided scab using sterile chisel blade revealing no residual ulceration, no completely healed at right 1st toe -Advised patient that dressings are no longer needed; may leave open to air -Continue post op shoe for the next few weeks to prevent rubbing at newly healed ulceration at right 1st toe  -Nails debrided using sterile nail nipper without incident -Patient to return in 8 weeks for toe recheck after upcoming vascular appts for follow up care and evaluation or sooner if problems arise.  Landis Martins, DPM

## 2016-12-11 DIAGNOSIS — E1129 Type 2 diabetes mellitus with other diabetic kidney complication: Secondary | ICD-10-CM | POA: Diagnosis not present

## 2016-12-11 DIAGNOSIS — N186 End stage renal disease: Secondary | ICD-10-CM | POA: Diagnosis not present

## 2016-12-11 DIAGNOSIS — R509 Fever, unspecified: Secondary | ICD-10-CM | POA: Diagnosis not present

## 2016-12-11 DIAGNOSIS — D631 Anemia in chronic kidney disease: Secondary | ICD-10-CM | POA: Diagnosis not present

## 2016-12-11 DIAGNOSIS — D509 Iron deficiency anemia, unspecified: Secondary | ICD-10-CM | POA: Diagnosis not present

## 2016-12-11 DIAGNOSIS — N2581 Secondary hyperparathyroidism of renal origin: Secondary | ICD-10-CM | POA: Diagnosis not present

## 2016-12-13 DIAGNOSIS — R509 Fever, unspecified: Secondary | ICD-10-CM | POA: Diagnosis not present

## 2016-12-13 DIAGNOSIS — N2581 Secondary hyperparathyroidism of renal origin: Secondary | ICD-10-CM | POA: Diagnosis not present

## 2016-12-13 DIAGNOSIS — N186 End stage renal disease: Secondary | ICD-10-CM | POA: Diagnosis not present

## 2016-12-13 DIAGNOSIS — D631 Anemia in chronic kidney disease: Secondary | ICD-10-CM | POA: Diagnosis not present

## 2016-12-13 DIAGNOSIS — Z992 Dependence on renal dialysis: Secondary | ICD-10-CM | POA: Diagnosis not present

## 2016-12-13 DIAGNOSIS — E1129 Type 2 diabetes mellitus with other diabetic kidney complication: Secondary | ICD-10-CM | POA: Diagnosis not present

## 2016-12-13 DIAGNOSIS — I129 Hypertensive chronic kidney disease with stage 1 through stage 4 chronic kidney disease, or unspecified chronic kidney disease: Secondary | ICD-10-CM | POA: Diagnosis not present

## 2016-12-13 DIAGNOSIS — D509 Iron deficiency anemia, unspecified: Secondary | ICD-10-CM | POA: Diagnosis not present

## 2016-12-16 DIAGNOSIS — E1129 Type 2 diabetes mellitus with other diabetic kidney complication: Secondary | ICD-10-CM | POA: Diagnosis not present

## 2016-12-16 DIAGNOSIS — Z23 Encounter for immunization: Secondary | ICD-10-CM | POA: Diagnosis not present

## 2016-12-16 DIAGNOSIS — N186 End stage renal disease: Secondary | ICD-10-CM | POA: Diagnosis not present

## 2016-12-16 DIAGNOSIS — N2581 Secondary hyperparathyroidism of renal origin: Secondary | ICD-10-CM | POA: Diagnosis not present

## 2016-12-16 DIAGNOSIS — D631 Anemia in chronic kidney disease: Secondary | ICD-10-CM | POA: Diagnosis not present

## 2016-12-16 DIAGNOSIS — D509 Iron deficiency anemia, unspecified: Secondary | ICD-10-CM | POA: Diagnosis not present

## 2016-12-17 ENCOUNTER — Ambulatory Visit (INDEPENDENT_AMBULATORY_CARE_PROVIDER_SITE_OTHER): Payer: Medicare Other | Admitting: Cardiovascular Disease

## 2016-12-17 ENCOUNTER — Encounter: Payer: Self-pay | Admitting: Cardiovascular Disease

## 2016-12-17 VITALS — BP 135/69 | HR 84 | Ht 70.5 in | Wt 221.0 lb

## 2016-12-17 DIAGNOSIS — I739 Peripheral vascular disease, unspecified: Secondary | ICD-10-CM

## 2016-12-17 DIAGNOSIS — I2510 Atherosclerotic heart disease of native coronary artery without angina pectoris: Secondary | ICD-10-CM | POA: Diagnosis not present

## 2016-12-17 DIAGNOSIS — Z9861 Coronary angioplasty status: Secondary | ICD-10-CM | POA: Diagnosis not present

## 2016-12-17 DIAGNOSIS — E785 Hyperlipidemia, unspecified: Secondary | ICD-10-CM

## 2016-12-17 NOTE — Progress Notes (Signed)
Cardiology Office Note   Date:  12/17/2016   ID:  Brooke Stafford, DOB 1956-04-08, MRN [id number]  PCP:  Arnoldo Morale, MD  Cardiologist: Dr. Sallyanne Kuster Nephrologist: Dr. Jimmy Footman   No chief complaint on file.     History of Present Illness: Brooke Stafford is a 61 y.o. female who is here today for a follow-up visit regarding peripheral arterial disease.  She has known history of coronary artery disease with previous CABG and stent placement, chronic systolic and diastolic heart failure, insulin-requiring diabetes mellitus, hypertension, hyperlipidemia and end-stage renal disease on hemodialysis on Monday Wednesday and Friday. She is known to have peripheral arterial disease with previous left SFA stent placement in 2012 and 2014.  She was seen recently for right great toe ulceration. ABI was 0.6 on the right side with evidence of short occlusion of the popliteal artery.The left SFA stent was noted to be occluded. I proceeded with angiography which showed diffuse proximal and mid SFA disease with short occlusion distally as well as short proximal right popliteal artery stenosis and three-vessel runoff below the knee. I performed successful drug-coated balloon angioplasty and 2 self-expanding stent placement to both the popliteal and SFA.  The ulceration healed completely since then she is doing well. She has no ulceration or claudication affecting the left leg.  Past Medical History:  Diagnosis Date  . Anemia   . Anginal pain ([place])   . Anxiety   . Arthritis    "stiff fingers and knees" (08/04/2013), (12/12/2014)  . Asthma   . CAD (coronary artery disease) 2002; 2015   CABG x 6 2002, cath 2011- med Rx stent DES VG-Diag  . CAD (coronary artery disease) of artery bypass graft; DES to VG-Diag 09/28/13 11/09/2013  . Cataract   . CHF (congestive heart failure) ([place])    "in 2002" (11/26/2012)  . Chronic bronchitis ([place])    "q year; in the winter"   . CKD (chronic kidney disease)    stage 4, followed by [facility]  . Coronary artery disease 2002   CABG x 6. Cath 5/11- med Rx  . Diabetic neuropathy ([place])   . GERD (gastroesophageal reflux disease)   . Gout    "right big toe"  . Headache    "~ q week" (08/04/2013); "~ twice/month" (12/12/2014)  . History of blood transfusion 2002   "when I had OHS"  . Hyperlipidemia   . Hypertension   . Hypothyroid    treated  . Migraines    "couple times/year" (08/04/2013), (12/12/2014)  . Myocardial infarction ([place]) 2000; 2002; 2011  . Obesity (BMI 35.0-39.9 without comorbidity)   . Peripheral vascular disease ([place]) 12/12   LSFA PTA  . Pneumonia    "3 times I think" (12/12/2014)  . Stroke [facility])    " mini stroke"  . Type II diabetes mellitus ([place])     Past Surgical History:  Procedure Laterality Date  . ABDOMINAL AORTAGRAM N/A 04/05/2011   Procedure: ABDOMINAL AORTAGRAM;  Surgeon: Lorretta Harp, MD;  Location: [facility] CATH LAB;  Service: Cardiovascular;  Laterality: N/A;  . ABDOMINAL AORTOGRAM N/A 10/30/2016   Procedure: Abdominal Aortogram;  Surgeon: Wellington Hampshire, MD;  Location: [place] CV LAB;  Service: Cardiovascular;  Laterality: N/A;  . APPENDECTOMY  1980  . AV FISTULA PLACEMENT Left 05/10/2016   Procedure: LEFT ARM ARTERIOVENOUS (AV) FISTULA CREATION;  Surgeon: Rosetta Posner, MD;  Location: [place];  Service: Vascular;  Laterality: Left;  . BASCILIC VEIN TRANSPOSITION Left  06/26/2016   Procedure: SECOND STAGE BASILIC VEIN TRANSPOSITION;  Surgeon: Rosetta Posner, MD;  Location: [place];  Service: Vascular;  Laterality: Left;  . BREAST CYST EXCISION Right 1970's  . CARDIAC CATHETERIZATION  2002  . CARDIAC CATHETERIZATION N/A 12/12/2014   Procedure: Left Heart Cath and Cors/Grafts Angiography;  Surgeon: Peter M Martinique, MD;  Location: [place] CV LAB;  Service: Cardiovascular;  Laterality: N/A;  . CARDIAC CATHETERIZATION N/A 12/12/2014   Procedure: Coronary Stent Intervention;  Surgeon: Peter M Martinique, MD;  Location: [place] CV LAB;  Service: Cardiovascular;  Laterality: N/A;  . CATARACT EXTRACTION, BILATERAL    . CESAREAN SECTION  1978; 1980  . CHOLECYSTECTOMY  1982  . CORONARY ANGIOPLASTY WITH STENT PLACEMENT  2004; 2012   "I have 2 stents" (08/04/2013)  . CORONARY ANGIOPLASTY WITH STENT PLACEMENT  09/28/13   PTCA/ DES Xience stent to VG-Diag   . CORONARY ARTERY BYPASS GRAFT  11/20/2000   x6 LIMA to distal LAD, svg to first diag, svg to ramus intermediate branch and swquential SVG to cir marginal branch, SVG to posterior descending coronary and sequential SVG to first right posterolateral branch  . eye injections    . INSERTION OF DIALYSIS CATHETER Right 05/10/2016   Procedure: INSERTION OF DIALYSIS CATHETER - Right Internal Jugular Placement;  Surgeon: Rosetta Posner, MD;  Location: [place];  Service: Vascular;  Laterality: Right;  . LEFT HEART CATHETERIZATION WITH CORONARY/GRAFT ANGIOGRAM N/A 09/28/2013   Procedure: LEFT HEART CATHETERIZATION WITH Beatrix Fetters;  Surgeon: Peter M Martinique, MD;  Location: [facility] CATH LAB;  Service: Cardiovascular;  Laterality: N/A;  . LOWER EXTREMITY ANGIOGRAM  12/01/2012   Procedure: LOWER EXTREMITY ANGIOGRAM;  Surgeon: Lorretta Harp, MD;  Location: [facility] CATH LAB;  Service: Cardiovascular;;  . LOWER EXTREMITY ANGIOGRAPHY Right 10/30/2016   Procedure: Lower Extremity Angiography;  Surgeon: Wellington Hampshire, MD;  Location: [place] CV LAB;  Service: Cardiovascular;  Laterality: Right;  . NM MYOCAR PERF WALL MOTION  08/27/2004   negative  . PERCUTANEOUS STENT INTERVENTION Left 12/01/2012   Procedure: PERCUTANEOUS STENT INTERVENTION;  Surgeon: Lorretta Harp, MD;  Location: [facility] CATH LAB;  Service: Cardiovascular;  Laterality: Left;  Left SFA  . PERIPHERAL ARTERIAL STENT GRAFT Left    SFA/notes 04/07/2011 (11/30/2012)  . PERIPHERAL VASCULAR ATHERECTOMY Right 10/30/2016   Procedure: Peripheral Vascular Atherectomy;  Surgeon: Wellington Hampshire, MD;  Location: [place] CV  LAB;  Service: Cardiovascular;  Laterality: Right;  cancel unable to  . PERIPHERAL VASCULAR INTERVENTION Right 10/30/2016   Procedure: Peripheral Vascular Intervention;  Surgeon: Wellington Hampshire, MD;  Location: [place] CV LAB;  Service: Cardiovascular;  Laterality: Right;  SFA  . RENAL ANGIOGRAM N/A 04/05/2011   Procedure: RENAL ANGIOGRAM;  Surgeon: Lorretta Harp, MD;  Location: [facility] CATH LAB;  Service: Cardiovascular;  Laterality: N/A;  . TUBAL LIGATION  1980     Current Outpatient Prescriptions  Medication Sig Dispense Refill  . acetaminophen-codeine (TYLENOL #3) 300-30 MG tablet Take 1 tablet by mouth 2 (two) times daily as needed for moderate pain.    [name] albuterol (PROVENTIL HFA;VENTOLIN HFA) 108 (90 Base) MCG/ACT inhaler Inhale 2 puffs into the lungs every 6 (six) hours as needed for wheezing or shortness of breath. 3 Inhaler 3  . albuterol (PROVENTIL) (2.5 MG/3ML) 0.083% nebulizer solution Take 3 mLs (2.5 mg total) by nebulization every 6 (six) hours as needed for wheezing or shortness of breath. 150 mL 3  . allopurinol (  ZYLOPRIM) 300 MG tablet Take 1 tablet (300 mg total) by mouth daily. 90 tablet 3  . aspirin 81 MG chewable tablet Chew 1 tablet (81 mg total) by mouth daily. 30 tablet 10  . atorvastatin (LIPITOR) 40 MG tablet Take 1 tablet (40 mg total) by mouth every evening. 30 tablet 3  . cadexomer iodine (IODOSORB) 0.9 % gel Apply 1 application topically daily as needed for wound care. 40 g 0  . citalopram (CELEXA) 20 MG tablet Take 1 tablet (20 mg total) by mouth daily. (Patient taking differently: Take 20 mg by mouth at bedtime. ) 30 tablet 3  . clopidogrel (PLAVIX) 75 MG tablet take 1 tablet by mouth once daily (Patient taking differently: take 75 mg by mouth at bedtime) 90 tablet 3  . ezetimibe (ZETIA) 10 MG tablet Take 1 tablet (10 mg total) by mouth daily. 90 tablet 3  . ferric gluconate 125 mg in sodium chloride 0.9 % 100 mL Inject 125 mg into the vein Every  Tuesday,Thursday,and Saturday with dialysis.    . fluticasone (FLONASE) 50 MCG/ACT nasal spray Place 2 sprays into both nostrils daily as needed for allergies or rhinitis. 16 g 3  . gabapentin (NEURONTIN) 400 MG capsule Take 400 mg by mouth at bedtime.     . hydrOXYzine (ATARAX/VISTARIL) 25 MG tablet take 1 tablet by mouth three times a day (Patient taking differently: take 25 mg by mouth three times a day) 90 tablet 0  . insulin aspart (NOVOLOG) 100 UNIT/ML injection Inject 25-30 Units into the skin 3 (three) times daily before meals. Pens (Patient taking differently: Inject 25-30 Units into the skin 3 (three) times daily before meals. Per sliding scale) 15 mL 5  . Insulin Glargine (BASAGLAR KWIKPEN) 100 UNIT/ML SOPN INJECT 28 UNITS INTO THE SKIN AT BEDTIME 45 mL 3  . isosorbide mononitrate (IMDUR) 60 MG 24 hr tablet Take 30 mg by mouth 2 (two) times daily.     [name] levothyroxine (SYNTHROID, LEVOTHROID) 25 MCG tablet take 1 tablet by mouth every morning BEFORE BREAKFAST 30 tablet 2  . lidocaine-prilocaine (EMLA) cream Apply 1 application topically as directed.  1  . loratadine (CLARITIN) 10 MG tablet Take 10 mg by mouth daily.    . metoprolol tartrate (LOPRESSOR) 25 MG tablet Take 0.5 tablets (12.5 mg total) by mouth 2 (two) times daily. 90 tablet 3  . Naphazoline HCl (CLEAR EYES OP) Place 1 drop into both eyes as needed (for dry eyes).    . nitroGLYCERIN (NITROSTAT) 0.4 MG SL tablet Place 1 tablet (0.4 mg total) under the tongue every 5 (five) minutes as needed. (Patient taking differently: Place 0.4 mg under the tongue every 5 (five) minutes as needed for chest pain. ) 30 tablet 2  . ranitidine (ZANTAC 75) 75 MG tablet Take 1 tablet (75 mg total) by mouth daily as needed for heartburn.    [name] RENVELA 800 MG tablet Take 3 tablets (2,400 mg) by mouth three (3) times daily.  0  . senna-docusate (SENOKOT-S) 8.6-50 MG tablet Take 1 tablet by mouth at bedtime as needed for mild constipation. 30 tablet 0  .  sevelamer (RENAGEL) 800 MG tablet Take 2,400 mg by mouth 3 (three) times daily with meals.    . traMADol (ULTRAM) 50 MG tablet Take 1 tablet (50 mg total) by mouth every 12 (twelve) hours as needed. 40 tablet 1  . triamcinolone ointment (KENALOG) 0.5 % Apply 1 application topically daily.     No current facility-administered medications  for this visit.     Allergies:   Digoxin and related; Hydralazine; Penicillins cross reactors; Lisinopril; and Adhesive [tape]    Social History:  The patient  reports that she quit smoking about 16 years ago. Her smoking use included Cigarettes. She smoked 0.00 packs per day for 25.00 years. She has never used smokeless tobacco. She reports that she does not drink alcohol or use drugs.   Family History:  The patient's family history includes Diabetes in her mother and sister; Hyperlipidemia in her sister; Hypertension in her brother, father, mother, and sister; Stroke in her mother.    ROS:  Please see the history of present illness.   Otherwise, review of systems are positive for none.   All other systems are reviewed and negative.    PHYSICAL EXAM: VS:  BP 135/69 (BP Location: Right Arm)   Pulse 84   Ht 5' 10.5" (1.791 m)   Wt 221 lb (100.2 kg)   LMP 10/13/2014 (Exact Date)   BMI 31.26 kg/m  , BMI Body mass index is 31.26 kg/m. GEN: Well nourished, well developed, in no acute distress  HEENT: normal  Neck: no JVD, carotid bruits, or masses Cardiac: RRR; no murmurs, rubs, or gallops,no edema  Respiratory:  clear to auscultation bilaterally, normal work of breathing GI: soft, nontender, nondistended, + BS MS: no deformity or atrophy  Skin: warm and dry, no rash Neuro:  Strength and sensation are intact Psych: euthymic mood, full affect    EKG:  EKG is not ordered today.    Recent Labs: 05/03/2016: B Natriuretic Peptide 2,093.8 05/06/2016: Magnesium 2.2 05/07/2016: ALT 38 11/29/2016: BUN 16; Creatinine, Ser 4.21; Hemoglobin 10.5; Platelets  198; Potassium 3.6; Sodium 133    Lipid Panel    Component Value Date/Time   CHOL 142 07/25/2015 2037   CHOL 144 04/20/2015 1036   TRIG 163 (H) 05/07/2016 0450   HDL 39 (L) 07/25/2015 2037   HDL 51 04/20/2015 1036   CHOLHDL 3.6 07/25/2015 2037   VLDL 44 (H) 07/25/2015 2037   LDLCALC 59 07/25/2015 2037   LDLCALC 63 04/20/2015 1036      Wt Readings from Last 3 Encounters:  12/17/16 221 lb (100.2 kg)  11/29/16 223 lb (101.2 kg)  11/28/16 223 lb (101.2 kg)      No flowsheet data found.    ASSESSMENT AND PLAN:  1.  Peripheral arterial disease :  Status post successful endovascular intervention on the right SFA and popliteal artery. The right toe ulcer has healed completely. Continue dual antiplatelet therapy for now as tolerated. She does have occluded stent in the left SFA that she is asymptomatic. Continue to monitor. Repeat vascular studies in February.  2. End-stage renal disease on hemodialysis: We will likely need to admit her to the hospital for dialysis that day.  3. Chronic systolic/diastolic heart failure: She appears to be euvolemic.  4. Coronary artery disease involving native coronary arteries without angina: Continue medical therapy.  5. Hyperlipidemia: Continue treatment with atorvastatin with a target LDL of less than 70.  Disposition:   FU with me in 5 month  Signed,  Kathlyn Sacramento, MD  12/17/2016 11:25 AM    [place]

## 2016-12-17 NOTE — Patient Instructions (Signed)
Medication Instructions:   NO CHANGE  Testing/Procedures:  Your physician has requested that you have a lower extremity arterial duplex. During this test, ultrasound are used to evaluate arterial blood flow in the legs. Allow one hour for this exam. There are no restrictions or special instructions. SCHEDULE IN February SAME DAY  AS FOLLOW UP APPT  Follow-Up:  Your physician wants you to follow-up in: [place] will receive a reminder letter in the mail two months in advance. If you don't receive a letter, please call our office to schedule the follow-up appointment.   If you need a refill on your cardiac medications before your next appointment, please call your pharmacy.

## 2016-12-18 DIAGNOSIS — N186 End stage renal disease: Secondary | ICD-10-CM | POA: Diagnosis not present

## 2016-12-18 DIAGNOSIS — D509 Iron deficiency anemia, unspecified: Secondary | ICD-10-CM | POA: Diagnosis not present

## 2016-12-18 DIAGNOSIS — D631 Anemia in chronic kidney disease: Secondary | ICD-10-CM | POA: Diagnosis not present

## 2016-12-18 DIAGNOSIS — Z23 Encounter for immunization: Secondary | ICD-10-CM | POA: Diagnosis not present

## 2016-12-18 DIAGNOSIS — E1129 Type 2 diabetes mellitus with other diabetic kidney complication: Secondary | ICD-10-CM | POA: Diagnosis not present

## 2016-12-18 DIAGNOSIS — N2581 Secondary hyperparathyroidism of renal origin: Secondary | ICD-10-CM | POA: Diagnosis not present

## 2016-12-19 ENCOUNTER — Other Ambulatory Visit: Payer: Self-pay | Admitting: *Deleted

## 2016-12-19 NOTE — Patient Outreach (Signed)
THN follow up. Pt did not answer her phone, I left a message and requested a return call.  Brooke Stafford. Myrtie Neither, MSN, [facility] Gerontological Nurse Practitioner [facility] Care Management [phone]

## 2016-12-20 DIAGNOSIS — D509 Iron deficiency anemia, unspecified: Secondary | ICD-10-CM | POA: Diagnosis not present

## 2016-12-20 DIAGNOSIS — Z23 Encounter for immunization: Secondary | ICD-10-CM | POA: Diagnosis not present

## 2016-12-20 DIAGNOSIS — E1129 Type 2 diabetes mellitus with other diabetic kidney complication: Secondary | ICD-10-CM | POA: Diagnosis not present

## 2016-12-20 DIAGNOSIS — N2581 Secondary hyperparathyroidism of renal origin: Secondary | ICD-10-CM | POA: Diagnosis not present

## 2016-12-20 DIAGNOSIS — N186 End stage renal disease: Secondary | ICD-10-CM | POA: Diagnosis not present

## 2016-12-20 DIAGNOSIS — D631 Anemia in chronic kidney disease: Secondary | ICD-10-CM | POA: Diagnosis not present

## 2016-12-23 DIAGNOSIS — D631 Anemia in chronic kidney disease: Secondary | ICD-10-CM | POA: Diagnosis not present

## 2016-12-23 DIAGNOSIS — Z23 Encounter for immunization: Secondary | ICD-10-CM | POA: Diagnosis not present

## 2016-12-23 DIAGNOSIS — D509 Iron deficiency anemia, unspecified: Secondary | ICD-10-CM | POA: Diagnosis not present

## 2016-12-23 DIAGNOSIS — E1129 Type 2 diabetes mellitus with other diabetic kidney complication: Secondary | ICD-10-CM | POA: Diagnosis not present

## 2016-12-23 DIAGNOSIS — N2581 Secondary hyperparathyroidism of renal origin: Secondary | ICD-10-CM | POA: Diagnosis not present

## 2016-12-23 DIAGNOSIS — N186 End stage renal disease: Secondary | ICD-10-CM | POA: Diagnosis not present

## 2016-12-24 ENCOUNTER — Ambulatory Visit
Admission: RE | Admit: 2016-12-24 | Discharge: 2016-12-24 | Disposition: A | Discharge status: Home / Self Care | Payer: Medicare Other | Source: Ambulatory Visit | Attending: Family Medicine | Admitting: Family Medicine

## 2016-12-24 DIAGNOSIS — Z1231 Encounter for screening mammogram for malignant neoplasm of breast: Secondary | ICD-10-CM | POA: Diagnosis not present

## 2016-12-25 DIAGNOSIS — N186 End stage renal disease: Secondary | ICD-10-CM | POA: Diagnosis not present

## 2016-12-25 DIAGNOSIS — D631 Anemia in chronic kidney disease: Secondary | ICD-10-CM | POA: Diagnosis not present

## 2016-12-25 DIAGNOSIS — Z23 Encounter for immunization: Secondary | ICD-10-CM | POA: Diagnosis not present

## 2016-12-25 DIAGNOSIS — E1129 Type 2 diabetes mellitus with other diabetic kidney complication: Secondary | ICD-10-CM | POA: Diagnosis not present

## 2016-12-25 DIAGNOSIS — D509 Iron deficiency anemia, unspecified: Secondary | ICD-10-CM | POA: Diagnosis not present

## 2016-12-25 DIAGNOSIS — N2581 Secondary hyperparathyroidism of renal origin: Secondary | ICD-10-CM | POA: Diagnosis not present

## 2016-12-27 DIAGNOSIS — Z23 Encounter for immunization: Secondary | ICD-10-CM | POA: Diagnosis not present

## 2016-12-27 DIAGNOSIS — D631 Anemia in chronic kidney disease: Secondary | ICD-10-CM | POA: Diagnosis not present

## 2016-12-27 DIAGNOSIS — D509 Iron deficiency anemia, unspecified: Secondary | ICD-10-CM | POA: Diagnosis not present

## 2016-12-27 DIAGNOSIS — N2581 Secondary hyperparathyroidism of renal origin: Secondary | ICD-10-CM | POA: Diagnosis not present

## 2016-12-27 DIAGNOSIS — N186 End stage renal disease: Secondary | ICD-10-CM | POA: Diagnosis not present

## 2016-12-27 DIAGNOSIS — E1129 Type 2 diabetes mellitus with other diabetic kidney complication: Secondary | ICD-10-CM | POA: Diagnosis not present

## 2016-12-30 DIAGNOSIS — N186 End stage renal disease: Secondary | ICD-10-CM | POA: Diagnosis not present

## 2016-12-30 DIAGNOSIS — Z23 Encounter for immunization: Secondary | ICD-10-CM | POA: Diagnosis not present

## 2016-12-30 DIAGNOSIS — N2581 Secondary hyperparathyroidism of renal origin: Secondary | ICD-10-CM | POA: Diagnosis not present

## 2016-12-30 DIAGNOSIS — E1129 Type 2 diabetes mellitus with other diabetic kidney complication: Secondary | ICD-10-CM | POA: Diagnosis not present

## 2016-12-30 DIAGNOSIS — D509 Iron deficiency anemia, unspecified: Secondary | ICD-10-CM | POA: Diagnosis not present

## 2016-12-30 DIAGNOSIS — D631 Anemia in chronic kidney disease: Secondary | ICD-10-CM | POA: Diagnosis not present

## 2016-12-31 DIAGNOSIS — Z961 Presence of intraocular lens: Secondary | ICD-10-CM | POA: Diagnosis not present

## 2017-01-01 DIAGNOSIS — N186 End stage renal disease: Secondary | ICD-10-CM | POA: Diagnosis not present

## 2017-01-01 DIAGNOSIS — D631 Anemia in chronic kidney disease: Secondary | ICD-10-CM | POA: Diagnosis not present

## 2017-01-01 DIAGNOSIS — N2581 Secondary hyperparathyroidism of renal origin: Secondary | ICD-10-CM | POA: Diagnosis not present

## 2017-01-01 DIAGNOSIS — Z23 Encounter for immunization: Secondary | ICD-10-CM | POA: Diagnosis not present

## 2017-01-01 DIAGNOSIS — E1129 Type 2 diabetes mellitus with other diabetic kidney complication: Secondary | ICD-10-CM | POA: Diagnosis not present

## 2017-01-01 DIAGNOSIS — D509 Iron deficiency anemia, unspecified: Secondary | ICD-10-CM | POA: Diagnosis not present

## 2017-01-03 DIAGNOSIS — D509 Iron deficiency anemia, unspecified: Secondary | ICD-10-CM | POA: Diagnosis not present

## 2017-01-03 DIAGNOSIS — N2581 Secondary hyperparathyroidism of renal origin: Secondary | ICD-10-CM | POA: Diagnosis not present

## 2017-01-03 DIAGNOSIS — Z23 Encounter for immunization: Secondary | ICD-10-CM | POA: Diagnosis not present

## 2017-01-03 DIAGNOSIS — E1129 Type 2 diabetes mellitus with other diabetic kidney complication: Secondary | ICD-10-CM | POA: Diagnosis not present

## 2017-01-03 DIAGNOSIS — D631 Anemia in chronic kidney disease: Secondary | ICD-10-CM | POA: Diagnosis not present

## 2017-01-03 DIAGNOSIS — N186 End stage renal disease: Secondary | ICD-10-CM | POA: Diagnosis not present

## 2017-01-06 ENCOUNTER — Other Ambulatory Visit: Payer: Self-pay | Admitting: *Deleted

## 2017-01-06 DIAGNOSIS — D509 Iron deficiency anemia, unspecified: Secondary | ICD-10-CM | POA: Diagnosis not present

## 2017-01-06 DIAGNOSIS — N2581 Secondary hyperparathyroidism of renal origin: Secondary | ICD-10-CM | POA: Diagnosis not present

## 2017-01-06 DIAGNOSIS — D631 Anemia in chronic kidney disease: Secondary | ICD-10-CM | POA: Diagnosis not present

## 2017-01-06 DIAGNOSIS — Z23 Encounter for immunization: Secondary | ICD-10-CM | POA: Diagnosis not present

## 2017-01-06 DIAGNOSIS — E1129 Type 2 diabetes mellitus with other diabetic kidney complication: Secondary | ICD-10-CM | POA: Diagnosis not present

## 2017-01-06 DIAGNOSIS — N186 End stage renal disease: Secondary | ICD-10-CM | POA: Diagnosis not present

## 2017-01-06 NOTE — Patient Outreach (Signed)
Called pt to follow up on behalf of a co-worker. Pt did not answer her phone and I left a voice mail and requested a return call.  Brooke Stafford. Myrtie Neither, MSN, [facility] Gerontological Nurse Practitioner [facility] Care Management [phone]

## 2017-01-07 ENCOUNTER — Other Ambulatory Visit: Payer: Self-pay | Admitting: Pharmacist

## 2017-01-07 MED ORDER — ATORVASTATIN CALCIUM 40 MG PO TABS: 40.0000 mg | ORAL_TABLET | Freq: Every evening | ORAL | 0 refills | Status: DC

## 2017-01-08 ENCOUNTER — Encounter: Payer: Self-pay | Admitting: *Deleted

## 2017-01-08 ENCOUNTER — Other Ambulatory Visit: Payer: Self-pay | Admitting: *Deleted

## 2017-01-08 DIAGNOSIS — D631 Anemia in chronic kidney disease: Secondary | ICD-10-CM | POA: Diagnosis not present

## 2017-01-08 DIAGNOSIS — D509 Iron deficiency anemia, unspecified: Secondary | ICD-10-CM | POA: Diagnosis not present

## 2017-01-08 DIAGNOSIS — E1129 Type 2 diabetes mellitus with other diabetic kidney complication: Secondary | ICD-10-CM | POA: Diagnosis not present

## 2017-01-08 DIAGNOSIS — N2581 Secondary hyperparathyroidism of renal origin: Secondary | ICD-10-CM | POA: Diagnosis not present

## 2017-01-08 DIAGNOSIS — Z23 Encounter for immunization: Secondary | ICD-10-CM | POA: Diagnosis not present

## 2017-01-08 DIAGNOSIS — N186 End stage renal disease: Secondary | ICD-10-CM | POA: Diagnosis not present

## 2017-01-08 NOTE — Patient Outreach (Signed)
Telephone call to follow up with pt for my co-worker, Loni Muse, RN. Mrs. Tersigni, actually called me today to advise me she is doing OK and she is on her way to dialysis. She reports she has no further care management needs.  I advised her that I will close her case today. I have also encouraged her to call me if she has any needs that arise in the future.  Eulah Pont. Myrtie Neither, MSN, [facility] Gerontological Nurse Practitioner [facility] Care Management [phone]

## 2017-01-09 DIAGNOSIS — T82858A Stenosis of vascular prosthetic devices, implants and grafts, initial encounter: Secondary | ICD-10-CM | POA: Diagnosis not present

## 2017-01-09 DIAGNOSIS — Z992 Dependence on renal dialysis: Secondary | ICD-10-CM | POA: Diagnosis not present

## 2017-01-09 DIAGNOSIS — N186 End stage renal disease: Secondary | ICD-10-CM | POA: Diagnosis not present

## 2017-01-09 DIAGNOSIS — I871 Compression of vein: Secondary | ICD-10-CM | POA: Diagnosis not present

## 2017-01-10 DIAGNOSIS — E1129 Type 2 diabetes mellitus with other diabetic kidney complication: Secondary | ICD-10-CM | POA: Diagnosis not present

## 2017-01-10 DIAGNOSIS — N186 End stage renal disease: Secondary | ICD-10-CM | POA: Diagnosis not present

## 2017-01-10 DIAGNOSIS — D509 Iron deficiency anemia, unspecified: Secondary | ICD-10-CM | POA: Diagnosis not present

## 2017-01-10 DIAGNOSIS — Z23 Encounter for immunization: Secondary | ICD-10-CM | POA: Diagnosis not present

## 2017-01-10 DIAGNOSIS — D631 Anemia in chronic kidney disease: Secondary | ICD-10-CM | POA: Diagnosis not present

## 2017-01-10 DIAGNOSIS — N2581 Secondary hyperparathyroidism of renal origin: Secondary | ICD-10-CM | POA: Diagnosis not present

## 2017-01-12 DIAGNOSIS — N186 End stage renal disease: Secondary | ICD-10-CM | POA: Diagnosis not present

## 2017-01-12 DIAGNOSIS — Z992 Dependence on renal dialysis: Secondary | ICD-10-CM | POA: Diagnosis not present

## 2017-01-12 DIAGNOSIS — I129 Hypertensive chronic kidney disease with stage 1 through stage 4 chronic kidney disease, or unspecified chronic kidney disease: Secondary | ICD-10-CM | POA: Diagnosis not present

## 2017-01-13 DIAGNOSIS — N2581 Secondary hyperparathyroidism of renal origin: Secondary | ICD-10-CM | POA: Diagnosis not present

## 2017-01-13 DIAGNOSIS — D509 Iron deficiency anemia, unspecified: Secondary | ICD-10-CM | POA: Diagnosis not present

## 2017-01-13 DIAGNOSIS — E1129 Type 2 diabetes mellitus with other diabetic kidney complication: Secondary | ICD-10-CM | POA: Diagnosis not present

## 2017-01-13 DIAGNOSIS — N186 End stage renal disease: Secondary | ICD-10-CM | POA: Diagnosis not present

## 2017-01-13 DIAGNOSIS — D631 Anemia in chronic kidney disease: Secondary | ICD-10-CM | POA: Diagnosis not present

## 2017-01-14 DIAGNOSIS — E113412 Type 2 diabetes mellitus with severe nonproliferative diabetic retinopathy with macular edema, left eye: Secondary | ICD-10-CM | POA: Diagnosis not present

## 2017-01-14 DIAGNOSIS — E113411 Type 2 diabetes mellitus with severe nonproliferative diabetic retinopathy with macular edema, right eye: Secondary | ICD-10-CM | POA: Diagnosis not present

## 2017-01-15 DIAGNOSIS — D631 Anemia in chronic kidney disease: Secondary | ICD-10-CM | POA: Diagnosis not present

## 2017-01-15 DIAGNOSIS — N186 End stage renal disease: Secondary | ICD-10-CM | POA: Diagnosis not present

## 2017-01-15 DIAGNOSIS — D509 Iron deficiency anemia, unspecified: Secondary | ICD-10-CM | POA: Diagnosis not present

## 2017-01-15 DIAGNOSIS — N2581 Secondary hyperparathyroidism of renal origin: Secondary | ICD-10-CM | POA: Diagnosis not present

## 2017-01-15 DIAGNOSIS — E1129 Type 2 diabetes mellitus with other diabetic kidney complication: Secondary | ICD-10-CM | POA: Diagnosis not present

## 2017-01-17 DIAGNOSIS — N2581 Secondary hyperparathyroidism of renal origin: Secondary | ICD-10-CM | POA: Diagnosis not present

## 2017-01-17 DIAGNOSIS — D631 Anemia in chronic kidney disease: Secondary | ICD-10-CM | POA: Diagnosis not present

## 2017-01-17 DIAGNOSIS — N186 End stage renal disease: Secondary | ICD-10-CM | POA: Diagnosis not present

## 2017-01-17 DIAGNOSIS — E1129 Type 2 diabetes mellitus with other diabetic kidney complication: Secondary | ICD-10-CM | POA: Diagnosis not present

## 2017-01-17 DIAGNOSIS — D509 Iron deficiency anemia, unspecified: Secondary | ICD-10-CM | POA: Diagnosis not present

## 2017-01-20 DIAGNOSIS — N186 End stage renal disease: Secondary | ICD-10-CM | POA: Diagnosis not present

## 2017-01-20 DIAGNOSIS — N2581 Secondary hyperparathyroidism of renal origin: Secondary | ICD-10-CM | POA: Diagnosis not present

## 2017-01-20 DIAGNOSIS — D509 Iron deficiency anemia, unspecified: Secondary | ICD-10-CM | POA: Diagnosis not present

## 2017-01-20 DIAGNOSIS — D631 Anemia in chronic kidney disease: Secondary | ICD-10-CM | POA: Diagnosis not present

## 2017-01-20 DIAGNOSIS — E1129 Type 2 diabetes mellitus with other diabetic kidney complication: Secondary | ICD-10-CM | POA: Diagnosis not present

## 2017-01-21 ENCOUNTER — Encounter: Payer: Self-pay | Admitting: Sports Medicine

## 2017-01-21 ENCOUNTER — Ambulatory Visit (INDEPENDENT_AMBULATORY_CARE_PROVIDER_SITE_OTHER): Payer: Medicare Other | Admitting: Sports Medicine

## 2017-01-21 VITALS — BP 109/47 | HR 79 | Resp 16

## 2017-01-21 DIAGNOSIS — M79675 Pain in left toe(s): Principal | ICD-10-CM

## 2017-01-21 DIAGNOSIS — I739 Peripheral vascular disease, unspecified: Secondary | ICD-10-CM | POA: Diagnosis not present

## 2017-01-21 DIAGNOSIS — M79676 Pain in unspecified toe(s): Secondary | ICD-10-CM | POA: Diagnosis not present

## 2017-01-21 DIAGNOSIS — M79674 Pain in right toe(s): Principal | ICD-10-CM

## 2017-01-21 DIAGNOSIS — E11621 Type 2 diabetes mellitus with foot ulcer: Secondary | ICD-10-CM

## 2017-01-21 DIAGNOSIS — E1142 Type 2 diabetes mellitus with diabetic polyneuropathy: Secondary | ICD-10-CM

## 2017-01-21 DIAGNOSIS — B351 Tinea unguium: Secondary | ICD-10-CM | POA: Diagnosis not present

## 2017-01-21 DIAGNOSIS — L97519 Non-pressure chronic ulcer of other part of right foot with unspecified severity: Secondary | ICD-10-CM

## 2017-01-21 NOTE — Progress Notes (Signed)
Subjective: Brooke Stafford is a 61 y.o. female patient seen in office for follow up evaluation of ulceration of the right great toe. Patient is doing well after vascular intervention with Dr. Fletcher Anon. Patient is also here for diabetic foot care and desires to have her nails trimmed today as well. Patient has no other pedal complaints at this time.  FBS this AM 109, last A1c 6.2  Patient is also a dialysis patient and is on Plavix.   Patient Active Problem List   Diagnosis Date Noted  . Foot ulcer, right ([place]) 11/28/2016  . Cervical polyp 11/12/2016  . Critical lower limb ischemia 10/30/2016  . AV (arteriovenous fistula) ([place]) 06/17/2016  . Mixed hyperlipidemia 05/21/2016  . ESRD (end stage renal disease) ([place])   . Hypokalemia 05/03/2016  . COPD exacerbation ([place]) 05/03/2016  . COPD (chronic obstructive pulmonary disease) ([place]) 04/11/2016  . CAP (community acquired pneumonia) 03/26/2016  . Acute respiratory failure with hypoxia ([place]) 03/26/2016  . Acute on chronic combined systolic and diastolic CHF (congestive heart failure) ([place]) 03/26/2016  . Chronic kidney disease (CKD), stage IV (severe) ([place]) 03/26/2016  . Medication management 03/21/2016  . Anemia of chronic disease 12/27/2015  . Acute on chronic systolic CHF (congestive heart failure) ([place])   . Acute CHF ([place]) 12/18/2015  . Hypertensive heart and renal disease with CHF and ESRD ([place]) 12/18/2015  . Constipation 12/18/2015  . Adjustment disorder with anxious mood 11/02/2015  . Posterior circulation stroke ([place]) 11/02/2015  . Abnormality of gait 10/23/2015  . Depression 09/28/2015  . Diabetes mellitus due to underlying condition with diabetic peripheral angiopathy without gangrene, with long-term current use of insulin ([place]) 09/13/2015  . Diabetic retinopathy ([place]) 09/13/2015  . Grief reaction 08/24/2015  . Acute kidney injury superimposed on chronic kidney disease ([place]) 07/25/2015  . Creatinine elevation 07/25/2015  .  Chronic combined systolic and diastolic congestive heart failure ([place]) 07/25/2015  . Rash of hands 02/23/2015  . Essential hypertension   . Angina pectoris ([place]) 12/12/2014  . Abnormal nuclear stress test   . Acute combined systolic and diastolic heart failure ([place]) 12/02/2014  . Seasonal allergies 08/11/2014  . Gout 08/11/2014  . Encounter for screening mammogram for breast cancer 05/16/2014  . Screening for colon cancer 05/16/2014  . Diabetic neuropathy, type II diabetes mellitus ([place]) 01/27/2014  . Atopic eczema 01/27/2014  . Precordial pain, atypical, negative MI, Musculature Skeletal pain  11/08/2013  . CKD (chronic kidney disease) stage 5, GFR less than 15 ml/min (HCC) 11/08/2013  . Unstable angina ([place]) 09/28/2013  . Ischemic cardiomyopathy- new drop in EF 08/30/2013  . Chest pain 08/04/2013  . CAD -S/P PCI June 2015 and 12/14/14 02/01/2013  . Hypothyroidism, acquired 02/01/2013  . PVD, LSFA PTA 12/12 04/06/2011  . Hx of CABG x 6 2002 04/06/2011  . Dyslipidemia 04/06/2011   Current Outpatient Prescriptions on File Prior to Visit  Medication Sig Dispense Refill  . acetaminophen-codeine (TYLENOL #3) 300-30 MG tablet Take 1 tablet by mouth 2 (two) times daily as needed for moderate pain.    [name] albuterol (PROVENTIL HFA;VENTOLIN HFA) 108 (90 Base) MCG/ACT inhaler Inhale 2 puffs into the lungs every 6 (six) hours as needed for wheezing or shortness of breath. 3 Inhaler 3  . albuterol (PROVENTIL) (2.5 MG/3ML) 0.083% nebulizer solution Take 3 mLs (2.5 mg total) by nebulization every 6 (six) hours as needed for wheezing or shortness of breath. 150 mL 3  . allopurinol (ZYLOPRIM) 300 MG tablet Take 1 tablet (300 mg  total) by mouth daily. 90 tablet 3  . aspirin 81 MG chewable tablet Chew 1 tablet (81 mg total) by mouth daily. 30 tablet 10  . atorvastatin (LIPITOR) 40 MG tablet Take 1 tablet (40 mg total) by mouth every evening. 90 tablet 0  . cadexomer iodine (IODOSORB) 0.9 % gel Apply 1  application topically daily as needed for wound care. 40 g 0  . citalopram (CELEXA) 20 MG tablet Take 1 tablet (20 mg total) by mouth daily. (Patient taking differently: Take 20 mg by mouth at bedtime. ) 30 tablet 3  . clopidogrel (PLAVIX) 75 MG tablet take 1 tablet by mouth once daily (Patient taking differently: take 75 mg by mouth at bedtime) 90 tablet 3  . ezetimibe (ZETIA) 10 MG tablet Take 1 tablet (10 mg total) by mouth daily. 90 tablet 3  . ferric gluconate 125 mg in sodium chloride 0.9 % 100 mL Inject 125 mg into the vein Every Tuesday,Thursday,and Saturday with dialysis.    . fluticasone (FLONASE) 50 MCG/ACT nasal spray Place 2 sprays into both nostrils daily as needed for allergies or rhinitis. 16 g 3  . gabapentin (NEURONTIN) 400 MG capsule Take 400 mg by mouth at bedtime.     . hydrOXYzine (ATARAX/VISTARIL) 25 MG tablet take 1 tablet by mouth three times a day (Patient taking differently: take 25 mg by mouth three times a day) 90 tablet 0  . insulin aspart (NOVOLOG) 100 UNIT/ML injection Inject 25-30 Units into the skin 3 (three) times daily before meals. Pens (Patient taking differently: Inject 25-30 Units into the skin 3 (three) times daily before meals. Per sliding scale) 15 mL 5  . Insulin Glargine (BASAGLAR KWIKPEN) 100 UNIT/ML SOPN INJECT 28 UNITS INTO THE SKIN AT BEDTIME 45 mL 3  . isosorbide mononitrate (IMDUR) 60 MG 24 hr tablet Take 30 mg by mouth 2 (two) times daily.     [name] levothyroxine (SYNTHROID, LEVOTHROID) 25 MCG tablet take 1 tablet by mouth every morning BEFORE BREAKFAST 30 tablet 2  . lidocaine-prilocaine (EMLA) cream Apply 1 application topically as directed.  1  . loratadine (CLARITIN) 10 MG tablet Take 10 mg by mouth daily.    . metoprolol tartrate (LOPRESSOR) 25 MG tablet Take 0.5 tablets (12.5 mg total) by mouth 2 (two) times daily. 90 tablet 3  . Naphazoline HCl (CLEAR EYES OP) Place 1 drop into both eyes as needed (for dry eyes).    . nitroGLYCERIN (NITROSTAT)  0.4 MG SL tablet Place 1 tablet (0.4 mg total) under the tongue every 5 (five) minutes as needed. (Patient taking differently: Place 0.4 mg under the tongue every 5 (five) minutes as needed for chest pain. ) 30 tablet 2  . ranitidine (ZANTAC 75) 75 MG tablet Take 1 tablet (75 mg total) by mouth daily as needed for heartburn.    [name] RENVELA 800 MG tablet Take 3 tablets (2,400 mg) by mouth three (3) times daily.  0  . senna-docusate (SENOKOT-S) 8.6-50 MG tablet Take 1 tablet by mouth at bedtime as needed for mild constipation. 30 tablet 0  . sevelamer (RENAGEL) 800 MG tablet Take 2,400 mg by mouth 3 (three) times daily with meals.    . traMADol (ULTRAM) 50 MG tablet Take 1 tablet (50 mg total) by mouth every 12 (twelve) hours as needed. 40 tablet 1  . triamcinolone ointment (KENALOG) 0.5 % Apply 1 application topically daily.     No current facility-administered medications on file prior to visit.    Allergies  Allergen Reactions  . Digoxin And Related Diarrhea and Other (See Comments)    TOXIC DRUG LEVELS Patient stated she almost died. Had flu like symptoms as well as diarrhea.  . Hydralazine Shortness Of Breath  . Penicillins Cross Reactors Hives and Other (See Comments)    HIGH FEVER  Has patient had a PCN reaction causing immediate rash, facial/tongue/throat swelling, SOB or lightheadedness with hypotension: No Has patient had a PCN reaction causing SEVERE RASH INVOLVING MUCUS MEMBRANES or SKIN NECROSIS  #  #  #  YES  #  #  #  Has patient had a PCN reaction that required hospitalization: Unk Has patient had a PCN reaction occurring within the last 10 years: Unk If all of the above answers are "NO", then may proceed with Cephalosporin use.   [name] Lisinopril Other (See Comments)    Felt like she had the flu. Was very sick!!!  . Adhesive [Tape] Rash and Other (See Comments)    bruising   Objective: There were no vitals filed for this visit.   General: Patient is awake, alert, oriented x 3  and in no acute distress.  Dermatology: Skin is warm and dry bilateral with a continued healed ulceration  at right great toe. Ulceration resolved. No other acute signs of infection. Nails are elongated and mycotic.    Vascular: Dorsalis Pedis pulse = 1/4 Bilateral,  Posterior Tibial pulse = 0/4 Bilateral,  Capillary Fill Time < 5 seconds  Neurologic: Protective sensation absent bilateral.  Musculosketal:  No pain with palpation to previous ulcerated area. No pain with compression to calves bilateral. Asymptomatic hammertoe bony deformities noted bilateral.  Recent Labs  05/04/16 [place] WBC PRESENT,BOTH PMN AND MONONUCLEARNO ORGANISMS SEEN    Assessment and Plan:  Problem List Items Addressed This Visit    None    Visit Diagnoses    Pain due to onychomycosis of toenails of both feet    -  Primary   Ulcer of right great toe due to diabetes mellitus (HCC)       Diabetic polyneuropathy associated with type 2 diabetes mellitus (HCC)       PAD (peripheral artery disease) ([place])         -Examined patient and discussed the progression of the healed wound and treatment alternatives. - Previous ulcer healed at right 1st toe -Advised patient to continue with shoes that fit appropriately to prevent rubbing to toes/re-ulceration  -Nails x 10 debrided using sterile nail nipper without incident -Patient to return in 10 weeks for routine foot care or sooner if problems arise. Patient was also concerned about blood pressure 109/45, I recommend patient to speak with doctor at dialysis tomorrow to see if they need to make adjustments due to low diastylolic #.   Landis Martins, DPM

## 2017-01-22 ENCOUNTER — Telehealth: Payer: Self-pay | Admitting: Internal Medicine

## 2017-01-22 ENCOUNTER — Other Ambulatory Visit: Payer: Self-pay

## 2017-01-22 DIAGNOSIS — D631 Anemia in chronic kidney disease: Secondary | ICD-10-CM | POA: Diagnosis not present

## 2017-01-22 DIAGNOSIS — N186 End stage renal disease: Secondary | ICD-10-CM | POA: Diagnosis not present

## 2017-01-22 DIAGNOSIS — E1129 Type 2 diabetes mellitus with other diabetic kidney complication: Secondary | ICD-10-CM | POA: Diagnosis not present

## 2017-01-22 DIAGNOSIS — N2581 Secondary hyperparathyroidism of renal origin: Secondary | ICD-10-CM | POA: Diagnosis not present

## 2017-01-22 DIAGNOSIS — E039 Hypothyroidism, unspecified: Secondary | ICD-10-CM | POA: Diagnosis not present

## 2017-01-22 DIAGNOSIS — D509 Iron deficiency anemia, unspecified: Secondary | ICD-10-CM | POA: Diagnosis not present

## 2017-01-22 MED ORDER — BASAGLAR KWIKPEN 100 UNIT/ML ~~LOC~~ SOPN: PEN_INJECTOR | SUBCUTANEOUS | 3 refills | Status: DC

## 2017-01-22 NOTE — Telephone Encounter (Signed)
Submitted refill, patient is now on Basaglar.

## 2017-01-22 NOTE — Telephone Encounter (Signed)
Patient need a refill of Lantus.

## 2017-01-24 DIAGNOSIS — N186 End stage renal disease: Secondary | ICD-10-CM | POA: Diagnosis not present

## 2017-01-24 DIAGNOSIS — N2581 Secondary hyperparathyroidism of renal origin: Secondary | ICD-10-CM | POA: Diagnosis not present

## 2017-01-24 DIAGNOSIS — E1129 Type 2 diabetes mellitus with other diabetic kidney complication: Secondary | ICD-10-CM | POA: Diagnosis not present

## 2017-01-24 DIAGNOSIS — D509 Iron deficiency anemia, unspecified: Secondary | ICD-10-CM | POA: Diagnosis not present

## 2017-01-24 DIAGNOSIS — D631 Anemia in chronic kidney disease: Secondary | ICD-10-CM | POA: Diagnosis not present

## 2017-01-26 ENCOUNTER — Other Ambulatory Visit: Payer: Self-pay | Admitting: Internal Medicine

## 2017-01-26 ENCOUNTER — Other Ambulatory Visit: Payer: Self-pay | Admitting: Vascular Surgery

## 2017-01-26 DIAGNOSIS — I5042 Chronic combined systolic (congestive) and diastolic (congestive) heart failure: Secondary | ICD-10-CM

## 2017-01-27 DIAGNOSIS — E1129 Type 2 diabetes mellitus with other diabetic kidney complication: Secondary | ICD-10-CM | POA: Diagnosis not present

## 2017-01-27 DIAGNOSIS — N2581 Secondary hyperparathyroidism of renal origin: Secondary | ICD-10-CM | POA: Diagnosis not present

## 2017-01-27 DIAGNOSIS — D509 Iron deficiency anemia, unspecified: Secondary | ICD-10-CM | POA: Diagnosis not present

## 2017-01-27 DIAGNOSIS — D631 Anemia in chronic kidney disease: Secondary | ICD-10-CM | POA: Diagnosis not present

## 2017-01-27 DIAGNOSIS — N186 End stage renal disease: Secondary | ICD-10-CM | POA: Diagnosis not present

## 2017-01-29 DIAGNOSIS — D631 Anemia in chronic kidney disease: Secondary | ICD-10-CM | POA: Diagnosis not present

## 2017-01-29 DIAGNOSIS — E1129 Type 2 diabetes mellitus with other diabetic kidney complication: Secondary | ICD-10-CM | POA: Diagnosis not present

## 2017-01-29 DIAGNOSIS — N2581 Secondary hyperparathyroidism of renal origin: Secondary | ICD-10-CM | POA: Diagnosis not present

## 2017-01-29 DIAGNOSIS — D509 Iron deficiency anemia, unspecified: Secondary | ICD-10-CM | POA: Diagnosis not present

## 2017-01-29 DIAGNOSIS — N186 End stage renal disease: Secondary | ICD-10-CM | POA: Diagnosis not present

## 2017-01-31 DIAGNOSIS — E1129 Type 2 diabetes mellitus with other diabetic kidney complication: Secondary | ICD-10-CM | POA: Diagnosis not present

## 2017-01-31 DIAGNOSIS — N186 End stage renal disease: Secondary | ICD-10-CM | POA: Diagnosis not present

## 2017-01-31 DIAGNOSIS — D631 Anemia in chronic kidney disease: Secondary | ICD-10-CM | POA: Diagnosis not present

## 2017-01-31 DIAGNOSIS — D509 Iron deficiency anemia, unspecified: Secondary | ICD-10-CM | POA: Diagnosis not present

## 2017-01-31 DIAGNOSIS — N2581 Secondary hyperparathyroidism of renal origin: Secondary | ICD-10-CM | POA: Diagnosis not present

## 2017-02-03 DIAGNOSIS — N186 End stage renal disease: Secondary | ICD-10-CM | POA: Diagnosis not present

## 2017-02-03 DIAGNOSIS — D631 Anemia in chronic kidney disease: Secondary | ICD-10-CM | POA: Diagnosis not present

## 2017-02-03 DIAGNOSIS — E1129 Type 2 diabetes mellitus with other diabetic kidney complication: Secondary | ICD-10-CM | POA: Diagnosis not present

## 2017-02-03 DIAGNOSIS — D509 Iron deficiency anemia, unspecified: Secondary | ICD-10-CM | POA: Diagnosis not present

## 2017-02-03 DIAGNOSIS — N2581 Secondary hyperparathyroidism of renal origin: Secondary | ICD-10-CM | POA: Diagnosis not present

## 2017-02-04 ENCOUNTER — Other Ambulatory Visit: Payer: Self-pay | Admitting: Vascular Surgery

## 2017-02-04 DIAGNOSIS — Z961 Presence of intraocular lens: Secondary | ICD-10-CM | POA: Diagnosis not present

## 2017-02-04 DIAGNOSIS — H35353 Cystoid macular degeneration, bilateral: Secondary | ICD-10-CM | POA: Diagnosis not present

## 2017-02-04 DIAGNOSIS — E113513 Type 2 diabetes mellitus with proliferative diabetic retinopathy with macular edema, bilateral: Secondary | ICD-10-CM | POA: Diagnosis not present

## 2017-02-05 DIAGNOSIS — D509 Iron deficiency anemia, unspecified: Secondary | ICD-10-CM | POA: Diagnosis not present

## 2017-02-05 DIAGNOSIS — E1129 Type 2 diabetes mellitus with other diabetic kidney complication: Secondary | ICD-10-CM | POA: Diagnosis not present

## 2017-02-05 DIAGNOSIS — N2581 Secondary hyperparathyroidism of renal origin: Secondary | ICD-10-CM | POA: Diagnosis not present

## 2017-02-05 DIAGNOSIS — N186 End stage renal disease: Secondary | ICD-10-CM | POA: Diagnosis not present

## 2017-02-05 DIAGNOSIS — D631 Anemia in chronic kidney disease: Secondary | ICD-10-CM | POA: Diagnosis not present

## 2017-02-07 DIAGNOSIS — N186 End stage renal disease: Secondary | ICD-10-CM | POA: Diagnosis not present

## 2017-02-07 DIAGNOSIS — E1129 Type 2 diabetes mellitus with other diabetic kidney complication: Secondary | ICD-10-CM | POA: Diagnosis not present

## 2017-02-07 DIAGNOSIS — N2581 Secondary hyperparathyroidism of renal origin: Secondary | ICD-10-CM | POA: Diagnosis not present

## 2017-02-07 DIAGNOSIS — D509 Iron deficiency anemia, unspecified: Secondary | ICD-10-CM | POA: Diagnosis not present

## 2017-02-07 DIAGNOSIS — D631 Anemia in chronic kidney disease: Secondary | ICD-10-CM | POA: Diagnosis not present

## 2017-02-10 DIAGNOSIS — N2581 Secondary hyperparathyroidism of renal origin: Secondary | ICD-10-CM | POA: Diagnosis not present

## 2017-02-10 DIAGNOSIS — E1129 Type 2 diabetes mellitus with other diabetic kidney complication: Secondary | ICD-10-CM | POA: Diagnosis not present

## 2017-02-10 DIAGNOSIS — D509 Iron deficiency anemia, unspecified: Secondary | ICD-10-CM | POA: Diagnosis not present

## 2017-02-10 DIAGNOSIS — N186 End stage renal disease: Secondary | ICD-10-CM | POA: Diagnosis not present

## 2017-02-10 DIAGNOSIS — D631 Anemia in chronic kidney disease: Secondary | ICD-10-CM | POA: Diagnosis not present

## 2017-02-12 DIAGNOSIS — I129 Hypertensive chronic kidney disease with stage 1 through stage 4 chronic kidney disease, or unspecified chronic kidney disease: Secondary | ICD-10-CM | POA: Diagnosis not present

## 2017-02-12 DIAGNOSIS — D509 Iron deficiency anemia, unspecified: Secondary | ICD-10-CM | POA: Diagnosis not present

## 2017-02-12 DIAGNOSIS — N186 End stage renal disease: Secondary | ICD-10-CM | POA: Diagnosis not present

## 2017-02-12 DIAGNOSIS — E1129 Type 2 diabetes mellitus with other diabetic kidney complication: Secondary | ICD-10-CM | POA: Diagnosis not present

## 2017-02-12 DIAGNOSIS — Z992 Dependence on renal dialysis: Secondary | ICD-10-CM | POA: Diagnosis not present

## 2017-02-12 DIAGNOSIS — N2581 Secondary hyperparathyroidism of renal origin: Secondary | ICD-10-CM | POA: Diagnosis not present

## 2017-02-12 DIAGNOSIS — D631 Anemia in chronic kidney disease: Secondary | ICD-10-CM | POA: Diagnosis not present

## 2017-02-14 DIAGNOSIS — D509 Iron deficiency anemia, unspecified: Secondary | ICD-10-CM | POA: Diagnosis not present

## 2017-02-14 DIAGNOSIS — N186 End stage renal disease: Secondary | ICD-10-CM | POA: Diagnosis not present

## 2017-02-14 DIAGNOSIS — N2581 Secondary hyperparathyroidism of renal origin: Secondary | ICD-10-CM | POA: Diagnosis not present

## 2017-02-14 DIAGNOSIS — D631 Anemia in chronic kidney disease: Secondary | ICD-10-CM | POA: Diagnosis not present

## 2017-02-14 DIAGNOSIS — E1129 Type 2 diabetes mellitus with other diabetic kidney complication: Secondary | ICD-10-CM | POA: Diagnosis not present

## 2017-02-17 DIAGNOSIS — D509 Iron deficiency anemia, unspecified: Secondary | ICD-10-CM | POA: Diagnosis not present

## 2017-02-17 DIAGNOSIS — N186 End stage renal disease: Secondary | ICD-10-CM | POA: Diagnosis not present

## 2017-02-17 DIAGNOSIS — N2581 Secondary hyperparathyroidism of renal origin: Secondary | ICD-10-CM | POA: Diagnosis not present

## 2017-02-17 DIAGNOSIS — D631 Anemia in chronic kidney disease: Secondary | ICD-10-CM | POA: Diagnosis not present

## 2017-02-17 DIAGNOSIS — E1129 Type 2 diabetes mellitus with other diabetic kidney complication: Secondary | ICD-10-CM | POA: Diagnosis not present

## 2017-02-18 ENCOUNTER — Ambulatory Visit (HOSPITAL_COMMUNITY)
Admission: EM | Admit: 2017-02-18 | Discharge: 2017-02-18 | Disposition: A | Discharge status: Home / Self Care | Payer: Medicare Other | Attending: Internal Medicine | Admitting: Internal Medicine

## 2017-02-18 ENCOUNTER — Encounter (HOSPITAL_COMMUNITY): Payer: Self-pay | Admitting: Emergency Medicine

## 2017-02-18 ENCOUNTER — Ambulatory Visit (INDEPENDENT_AMBULATORY_CARE_PROVIDER_SITE_OTHER): Payer: Medicare Other

## 2017-02-18 DIAGNOSIS — R0982 Postnasal drip: Secondary | ICD-10-CM | POA: Diagnosis not present

## 2017-02-18 DIAGNOSIS — J9801 Acute bronchospasm: Secondary | ICD-10-CM

## 2017-02-18 DIAGNOSIS — R05 Cough: Secondary | ICD-10-CM

## 2017-02-18 DIAGNOSIS — R059 Cough, unspecified: Secondary | ICD-10-CM

## 2017-02-18 MED ORDER — ALBUTEROL SULFATE HFA 108 (90 BASE) MCG/ACT IN AERS: 2.0000 | INHALATION_SPRAY | RESPIRATORY_TRACT | 0 refills | Status: DC | PRN

## 2017-02-18 NOTE — ED Triage Notes (Signed)
Pt here with cough and congestion; pt sts fever x 4 days; pt dialysis pt with last treatment Monday

## 2017-02-18 NOTE — Discharge Instructions (Signed)
Your cough is likely due in part to the drainage in the back of your throat and bronchospasm. Use the albuterol inhaler 2 puffs every 4 hours as needed for cough. Take Allegra or Zyrtec daily to help with drainage in the back of your throat. He may continue taking Robitussin-DM if this helps. Make sure he drinking plenty of fluids, especially water. For worsening, new symptoms or problems follow-up with your primary care doctor or may return.

## 2017-02-18 NOTE — ED Provider Notes (Signed)
[place]    CSN: [id number] Arrival date & time: 02/18/17  1429     History   Chief Complaint Chief Complaint  Patient presents with  . Cough    HPI Brooke Stafford is a 61 y.o. female.   61 year old female patient with end-stage renal disease on dialysis complaining of a cough associated with PND for one week. Is also complaining of bilateral earaches. She states temperatures at home have been 99 and 100.3. She states she has to clear her throat frequently. She complaining of anterior chest soreness that is exacerbated with coughing. Denies shortness of breath, heaviness tightness fullness or pressure in the chest.      Past Medical History:  Diagnosis Date  . Anemia   . Anginal pain ([place])   . Anxiety   . Arthritis    "stiff fingers and knees" (08/04/2013), (12/12/2014)  . Asthma   . CAD (coronary artery disease) 2002; 2015   CABG x 6 2002, cath 2011- med Rx stent DES VG-Diag  . CAD (coronary artery disease) of artery bypass graft; DES to VG-Diag 09/28/13 11/09/2013  . Cataract   . CHF (congestive heart failure) ([place])    "in 2002" (11/26/2012)  . Chronic bronchitis ([place])    "q year; in the winter"   . CKD (chronic kidney disease)    stage 4, followed by [facility]  . Coronary artery disease 2002   CABG x 6. Cath 5/11- med Rx  . Diabetic neuropathy ([place])   . GERD (gastroesophageal reflux disease)   . Gout    "right big toe"  . Headache    "~ q week" (08/04/2013); "~ twice/month" (12/12/2014)  . History of blood transfusion 2002   "when I had OHS"  . Hyperlipidemia   . Hypertension   . Hypothyroid    treated  . Migraines    "couple times/year" (08/04/2013), (12/12/2014)  . Myocardial infarction ([place]) 2000; 2002; 2011  . Obesity (BMI 35.0-39.9 without comorbidity)   . Peripheral vascular disease ([place]) 12/12   LSFA PTA  . Pneumonia    "3 times I think" (12/12/2014)  . Stroke [facility])    " mini stroke"  . Type II diabetes mellitus [facility])      Patient Active Problem List   Diagnosis Date Noted  . Foot ulcer, right ([place]) 11/28/2016  . Cervical polyp 11/12/2016  . Critical lower limb ischemia 10/30/2016  . AV (arteriovenous fistula) ([place]) 06/17/2016  . Mixed hyperlipidemia 05/21/2016  . ESRD (end stage renal disease) ([place])   . Hypokalemia 05/03/2016  . COPD exacerbation ([place]) 05/03/2016  . COPD (chronic obstructive pulmonary disease) ([place]) 04/11/2016  . CAP (community acquired pneumonia) 03/26/2016  . Acute respiratory failure with hypoxia ([place]) 03/26/2016  . Acute on chronic combined systolic and diastolic CHF (congestive heart failure) ([place]) 03/26/2016  . Chronic kidney disease (CKD), stage IV (severe) ([place]) 03/26/2016  . Medication management 03/21/2016  . Anemia of chronic disease 12/27/2015  . Acute on chronic systolic CHF (congestive heart failure) ([place])   . Acute CHF ([place]) 12/18/2015  . Hypertensive heart and renal disease with CHF and ESRD ([place]) 12/18/2015  . Constipation 12/18/2015  . Adjustment disorder with anxious mood 11/02/2015  . Posterior circulation stroke ([place]) 11/02/2015  . Abnormality of gait 10/23/2015  . Depression 09/28/2015  . Diabetes mellitus due to underlying condition with diabetic peripheral angiopathy without gangrene, with long-term current use of insulin ([place]) 09/13/2015  . Diabetic retinopathy ([place]) 09/13/2015  . Grief reaction 08/24/2015  .  Acute kidney injury superimposed on chronic kidney disease ([place]) 07/25/2015  . Creatinine elevation 07/25/2015  . Chronic combined systolic and diastolic congestive heart failure ([place]) 07/25/2015  . Rash of hands 02/23/2015  . Essential hypertension   . Angina pectoris ([place]) 12/12/2014  . Abnormal nuclear stress test   . Acute combined systolic and diastolic heart failure ([place]) 12/02/2014  . Seasonal allergies 08/11/2014  . Gout 08/11/2014  . Encounter for screening mammogram for breast cancer 05/16/2014  . Screening for colon cancer  05/16/2014  . Diabetic neuropathy, type II diabetes mellitus ([place]) 01/27/2014  . Atopic eczema 01/27/2014  . Precordial pain, atypical, negative MI, Musculature Skeletal pain  11/08/2013  . CKD (chronic kidney disease) stage 5, GFR less than 15 ml/min (HCC) 11/08/2013  . Unstable angina ([place]) 09/28/2013  . Ischemic cardiomyopathy- new drop in EF 08/30/2013  . Chest pain 08/04/2013  . CAD -S/P PCI June 2015 and 12/14/14 02/01/2013  . Hypothyroidism, acquired 02/01/2013  . PVD, LSFA PTA 12/12 04/06/2011  . Hx of CABG x 6 2002 04/06/2011  . Dyslipidemia 04/06/2011    Past Surgical History:  Procedure Laterality Date  . APPENDECTOMY  1980  . BREAST CYST EXCISION Right 1970's  . CARDIAC CATHETERIZATION  2002  . CATARACT EXTRACTION, BILATERAL    . CESAREAN SECTION  1978; 1980  . CHOLECYSTECTOMY  1982  . CORONARY ANGIOPLASTY WITH STENT PLACEMENT  2004; 2012   "I have 2 stents" (08/04/2013)  . CORONARY ANGIOPLASTY WITH STENT PLACEMENT  09/28/13   PTCA/ DES Xience stent to VG-Diag   . CORONARY ARTERY BYPASS GRAFT  11/20/2000   x6 LIMA to distal LAD, svg to first diag, svg to ramus intermediate branch and swquential SVG to cir marginal branch, SVG to posterior descending coronary and sequential SVG to first right posterolateral branch  . eye injections    . NM MYOCAR PERF WALL MOTION  08/27/2004   negative  . PERIPHERAL ARTERIAL STENT GRAFT Left    SFA/notes 04/07/2011 (11/30/2012)  . TUBAL LIGATION  1980    OB History    No data available       Home Medications    Prior to Admission medications   Medication Sig Start Date End Date Taking? Authorizing Provider  acetaminophen-codeine (TYLENOL #3) 300-30 MG tablet Take 1 tablet by mouth 2 (two) times daily as needed for moderate pain.    [provider]  albuterol (PROVENTIL HFA;VENTOLIN HFA) 108 (90 Base) MCG/ACT inhaler Inhale 2 puffs every 4 (four) hours as needed into the lungs for wheezing or shortness of breath. 02/18/17    Janne Napoleon, NP  allopurinol (ZYLOPRIM) 300 MG tablet Take 1 tablet (300 mg total) by mouth daily. 02/22/16   Tresa Garter, MD  aspirin 81 MG chewable tablet Chew 1 tablet (81 mg total) by mouth daily. 02/24/15   Croitoru, Mihai, MD  atorvastatin (LIPITOR) 40 MG tablet Take 1 tablet (40 mg total) by mouth every evening. 01/07/17   Arnoldo Morale, MD  cadexomer iodine (IODOSORB) 0.9 % gel Apply 1 application topically daily as needed for wound care. 09/03/16   Gardiner Barefoot, DPM  citalopram (CELEXA) 20 MG tablet Take 1 tablet (20 mg total) by mouth daily. Patient taking differently: Take 20 mg by mouth at bedtime.  07/09/16   Brayton Caves, PA-C  clopidogrel (PLAVIX) 75 MG tablet take 1 tablet by mouth once daily Patient taking differently: take 75 mg by mouth at bedtime 09/27/16   Croitoru, Dani Gobble, MD  ezetimibe (  ZETIA) 10 MG tablet Take 1 tablet (10 mg total) by mouth daily. 01/11/16   Tresa Garter, MD  ferric gluconate 125 mg in sodium chloride 0.9 % 100 mL Inject 125 mg into the vein Every Tuesday,Thursday,and Saturday with dialysis. 05/16/16   Rosita Fire, MD  fluticasone [facility]) 50 MCG/ACT nasal spray Place 2 sprays into both nostrils daily as needed for allergies or rhinitis. 09/28/15   Tresa Garter, MD  gabapentin (NEURONTIN) 400 MG capsule Take 400 mg by mouth at bedtime.     [provider]  hydrOXYzine (ATARAX/VISTARIL) 25 MG tablet take 1 tablet by mouth three times a day Patient taking differently: take 25 mg by mouth three times a day 10/21/16   Tresa Garter, MD  insulin aspart (NOVOLOG) 100 UNIT/ML injection Inject 25-30 Units into the skin 3 (three) times daily before meals. [place] Patient taking differently: Inject 25-30 Units into the skin 3 (three) times daily before meals. Per sliding scale 08/20/16   Philemon Kingdom, MD  Insulin Glargine [facility]) 100 UNIT/ML SOPN INJECT 28 UNITS INTO THE SKIN AT BEDTIME 01/22/17   Philemon Kingdom, MD  isosorbide mononitrate (IMDUR) 60 MG 24 hr tablet Take 30 mg by mouth 2 (two) times daily.  07/08/16   [provider]  levothyroxine (SYNTHROID, LEVOTHROID) 25 MCG tablet take 1 tablet by mouth every morning before BREAKFAST 01/26/17   Waynetta Sandy, MD  levothyroxine (SYNTHROID, LEVOTHROID) 25 MCG tablet take 1 tablet by mouth every morning before BREAKFAST 02/04/17   Angelia Mould, MD  lidocaine-prilocaine (EMLA) cream Apply 1 application topically as directed. 08/08/16   [provider]  loratadine (CLARITIN) 10 MG tablet Take 10 mg by mouth daily.    [provider]  metoprolol tartrate (LOPRESSOR) 25 MG tablet Take 0.5 tablets (12.5 mg total) by mouth 2 (two) times daily. 08/08/16   Croitoru, Mihai, MD  Naphazoline HCl (CLEAR EYES OP) Place 1 drop into both eyes as needed (for dry eyes).    [provider]  nitroGLYCERIN (NITROSTAT) 0.4 MG SL tablet Place 1 tablet (0.4 mg total) under the tongue every 5 (five) minutes as needed. Patient taking differently: Place 0.4 mg under the tongue every 5 (five) minutes as needed for chest pain.  04/11/16   Arnoldo Morale, MD  ranitidine (ZANTAC 75) 75 MG tablet Take 1 tablet (75 mg total) by mouth daily as needed for heartburn. 06/26/16   Alvia Grove, PA-C  RENVELA 800 MG tablet Take 3 tablets (2,400 mg) by mouth three (3) times daily. 12/06/16   [provider]  senna-docusate (SENOKOT-S) 8.6-50 MG tablet Take 1 tablet by mouth at bedtime as needed for mild constipation. 12/23/15   Virgel Manifold, MD  sevelamer (RENAGEL) 800 MG tablet Take 2,400 mg by mouth 3 (three) times daily with meals.    [provider]  traMADol (ULTRAM) 50 MG tablet Take 1 tablet (50 mg total) by mouth every 12 (twelve) hours as needed. 11/12/16   Arnoldo Morale, MD  triamcinolone ointment (KENALOG) 0.5 % Apply 1 application topically daily. 06/26/16   Alvia Grove, PA-C    Family  History Family History  Problem Relation Age of Onset  . Diabetes Mother   . Hypertension Mother   . Stroke Mother   . Hypertension Father   . Hypertension Brother   . Hypertension Sister   . Diabetes Sister   . Hyperlipidemia Sister     Social History Social History  Tobacco Use  . Smoking status: Former Smoker    Packs/day: 0.00    Years: 25.00    Pack years: 0.00    Types: Cigarettes    Last attempt to quit: 04/04/2000    Years since quitting: 16.8  . Smokeless tobacco: Never Used  Substance Use Topics  . Alcohol use: No    Comment: 12/12/2014  "have a glass of red wine on my birthday q yr; that's it"  . Drug use: No     Allergies   Digoxin and related; Hydralazine; Penicillins cross reactors; Lisinopril; and Adhesive [tape]   Review of Systems Review of Systems  Constitutional: Positive for fever.  HENT: Positive for ear pain, postnasal drip and rhinorrhea. Negative for sore throat.   Respiratory: Positive for cough. Negative for shortness of breath.   Cardiovascular: Positive for chest pain.  Gastrointestinal: Negative.   Neurological: Negative.   All other systems reviewed and are negative.    Physical Exam Triage Vital Signs ED Triage Vitals [02/18/17 1453]  Enc Vitals Group     BP (!) 160/72     Pulse Rate 85     Resp 18     Temp 99 F (37.2 C)     Temp Source Oral     SpO2 100 %     Weight      Height      Head Circumference      Peak Flow      Pain Score 7     Pain Loc      Pain Edu?      Excl. in [place]?    No data found.  Updated Vital Signs BP (!) 160/72 (BP Location: Right Arm)   Pulse 85   Temp 99 F (37.2 C) (Oral)   Resp 18   LMP 10/13/2014 (Exact Date)   SpO2 100%   Visual Acuity Right Eye Distance:   Left Eye Distance:   Bilateral Distance:    Right Eye Near:   Left Eye Near:    Bilateral Near:     Physical Exam  Constitutional: She is oriented to person, place, and time. She appears well-developed and  well-nourished. No distress.  HENT:  Bilateral TMs are normal. Pearly gray, transparent, no effusion, retraction or erythema.  Oropharynx with minimal erythema and scant clear PND. No exudate or swelling  Eyes: EOM are normal.  Neck: Normal range of motion. Neck supple.  Cardiovascular: Normal rate, regular rhythm, normal heart sounds and intact distal pulses.  Pulmonary/Chest: Effort normal. No respiratory distress. She has no rales.  Lungs primarily clear. Mildly prolonged expiratory phase. Patient tends to cough when taking a deep breath. No definitive wheezes are heard.  Musculoskeletal: Normal range of motion. She exhibits no edema.  Lymphadenopathy:    She has no cervical adenopathy.  Neurological: She is alert and oriented to person, place, and time.  Skin: Skin is warm and dry. No rash noted.  Psychiatric: She has a normal mood and affect.  Nursing note and vitals reviewed.    UC Treatments / Results  Labs (all labs ordered are listed, but only abnormal results are displayed) Labs Reviewed - No data to display  EKG  EKG Interpretation None       Radiology Dg Chest 2 View  Result Date: 02/18/2017 CLINICAL DATA:  Productive cough and fever and chills EXAM: CHEST  2 VIEW COMPARISON:  11/29/2016 FINDINGS: Cardiac shadow is stable. Postsurgical changes are again seen. Scarring is noted in the left  mid lung and left base stable from the prior exam. No acute infiltrate or sizable effusion is seen. No acute bony abnormality is noted. IMPRESSION: No active cardiopulmonary disease. Electronically Signed   By: Inez Catalina M.D.   On: 02/18/2017 15:48    Procedures Procedures (including critical care time)  Medications Ordered in UC Medications - No data to display   Initial Impression / Assessment and Plan / UC Course  I have reviewed the triage vital signs and the nursing notes.  Pertinent labs & imaging results that were available during my care of the patient were  reviewed by me and considered in my medical decision making (see chart for details).    Your cough is likely due in part to the drainage in the back of your throat and bronchospasm. Use the albuterol inhaler 2 puffs every 4 hours as needed for cough. Take Allegra or Zyrtec daily to help with drainage in the back of your throat. He may continue taking Robitussin-DM if this helps. Make sure he drinking plenty of fluids, especially water. For worsening, new symptoms or problems follow-up with your primary care doctor or may return.     Final Clinical Impressions(s) / UC Diagnoses   Final diagnoses:  Cough  PND (post-nasal drip)  Bronchospasm    ED Discharge Orders        Ordered    albuterol (PROVENTIL HFA;VENTOLIN HFA) 108 (90 Base) MCG/ACT inhaler  Every 4 hours PRN     02/18/17 1607       Controlled Substance Prescriptions [place] Controlled Substance Registry consulted? Not Applicable   Janne Napoleon, NP 02/18/17 [phone]

## 2017-02-19 ENCOUNTER — Telehealth: Payer: Self-pay | Admitting: *Deleted

## 2017-02-19 DIAGNOSIS — N186 End stage renal disease: Secondary | ICD-10-CM | POA: Diagnosis not present

## 2017-02-19 DIAGNOSIS — E1129 Type 2 diabetes mellitus with other diabetic kidney complication: Secondary | ICD-10-CM | POA: Diagnosis not present

## 2017-02-19 DIAGNOSIS — N2581 Secondary hyperparathyroidism of renal origin: Secondary | ICD-10-CM | POA: Diagnosis not present

## 2017-02-19 DIAGNOSIS — D631 Anemia in chronic kidney disease: Secondary | ICD-10-CM | POA: Diagnosis not present

## 2017-02-19 DIAGNOSIS — D509 Iron deficiency anemia, unspecified: Secondary | ICD-10-CM | POA: Diagnosis not present

## 2017-02-19 NOTE — Telephone Encounter (Signed)
Brooke Stafford calling from [facility] states a RX was sent over for the patient . A continuous glucose monitoring system and a diagnoses code was not on the RX.  They are going to be faxing back the RX and they will need it faxed back and it needs to include supporting OV notes. Thank you

## 2017-02-20 NOTE — Telephone Encounter (Signed)
Submitted

## 2017-02-20 NOTE — Telephone Encounter (Signed)
Noted will be on the lookout for paperwork.

## 2017-02-21 DIAGNOSIS — D631 Anemia in chronic kidney disease: Secondary | ICD-10-CM | POA: Diagnosis not present

## 2017-02-21 DIAGNOSIS — N186 End stage renal disease: Secondary | ICD-10-CM | POA: Diagnosis not present

## 2017-02-21 DIAGNOSIS — E1129 Type 2 diabetes mellitus with other diabetic kidney complication: Secondary | ICD-10-CM | POA: Diagnosis not present

## 2017-02-21 DIAGNOSIS — D509 Iron deficiency anemia, unspecified: Secondary | ICD-10-CM | POA: Diagnosis not present

## 2017-02-21 DIAGNOSIS — N2581 Secondary hyperparathyroidism of renal origin: Secondary | ICD-10-CM | POA: Diagnosis not present

## 2017-02-24 ENCOUNTER — Telehealth: Payer: Self-pay

## 2017-02-24 DIAGNOSIS — D509 Iron deficiency anemia, unspecified: Secondary | ICD-10-CM | POA: Diagnosis not present

## 2017-02-24 DIAGNOSIS — N186 End stage renal disease: Secondary | ICD-10-CM | POA: Diagnosis not present

## 2017-02-24 DIAGNOSIS — D631 Anemia in chronic kidney disease: Secondary | ICD-10-CM | POA: Diagnosis not present

## 2017-02-24 DIAGNOSIS — N2581 Secondary hyperparathyroidism of renal origin: Secondary | ICD-10-CM | POA: Diagnosis not present

## 2017-02-24 DIAGNOSIS — E1129 Type 2 diabetes mellitus with other diabetic kidney complication: Secondary | ICD-10-CM | POA: Diagnosis not present

## 2017-02-24 MED ORDER — INSULIN ASPART 100 UNIT/ML ~~LOC~~ SOLN: 25.0000 [IU] | Freq: Three times a day (TID) | SUBCUTANEOUS | 0 refills | Status: DC

## 2017-02-24 NOTE — Telephone Encounter (Signed)
Patient called and stated she needs novolog refill

## 2017-02-24 NOTE — Telephone Encounter (Signed)
Script sent  

## 2017-02-24 NOTE — Addendum Note (Signed)
Addended by: Johna Sheriff on: 02/24/2017 03:15 PM   Modules accepted: Orders

## 2017-02-25 ENCOUNTER — Other Ambulatory Visit: Payer: Self-pay | Admitting: Family Medicine

## 2017-02-25 ENCOUNTER — Encounter: Payer: Self-pay | Admitting: Family Medicine

## 2017-02-25 ENCOUNTER — Ambulatory Visit: Payer: Medicare Other | Attending: Family Medicine | Admitting: Family Medicine

## 2017-02-25 VITALS — BP 117/65 | HR 95 | Temp 98.5°F | Ht 70.0 in | Wt 221.6 lb

## 2017-02-25 DIAGNOSIS — Z9841 Cataract extraction status, right eye: Secondary | ICD-10-CM | POA: Insufficient documentation

## 2017-02-25 DIAGNOSIS — M109 Gout, unspecified: Secondary | ICD-10-CM | POA: Insufficient documentation

## 2017-02-25 DIAGNOSIS — E785 Hyperlipidemia, unspecified: Secondary | ICD-10-CM | POA: Insufficient documentation

## 2017-02-25 DIAGNOSIS — J45909 Unspecified asthma, uncomplicated: Secondary | ICD-10-CM | POA: Insufficient documentation

## 2017-02-25 DIAGNOSIS — Z888 Allergy status to other drugs, medicaments and biological substances status: Secondary | ICD-10-CM | POA: Insufficient documentation

## 2017-02-25 DIAGNOSIS — E1122 Type 2 diabetes mellitus with diabetic chronic kidney disease: Secondary | ICD-10-CM | POA: Insufficient documentation

## 2017-02-25 DIAGNOSIS — Z9861 Coronary angioplasty status: Secondary | ICD-10-CM

## 2017-02-25 DIAGNOSIS — E669 Obesity, unspecified: Secondary | ICD-10-CM | POA: Insufficient documentation

## 2017-02-25 DIAGNOSIS — Z79899 Other long term (current) drug therapy: Secondary | ICD-10-CM | POA: Diagnosis not present

## 2017-02-25 DIAGNOSIS — Z794 Long term (current) use of insulin: Secondary | ICD-10-CM | POA: Diagnosis not present

## 2017-02-25 DIAGNOSIS — N186 End stage renal disease: Secondary | ICD-10-CM | POA: Diagnosis not present

## 2017-02-25 DIAGNOSIS — I509 Heart failure, unspecified: Secondary | ICD-10-CM | POA: Insufficient documentation

## 2017-02-25 DIAGNOSIS — I25810 Atherosclerosis of coronary artery bypass graft(s) without angina pectoris: Secondary | ICD-10-CM | POA: Insufficient documentation

## 2017-02-25 DIAGNOSIS — E039 Hypothyroidism, unspecified: Secondary | ICD-10-CM | POA: Insufficient documentation

## 2017-02-25 DIAGNOSIS — Z9851 Tubal ligation status: Secondary | ICD-10-CM | POA: Insufficient documentation

## 2017-02-25 DIAGNOSIS — I252 Old myocardial infarction: Secondary | ICD-10-CM | POA: Insufficient documentation

## 2017-02-25 DIAGNOSIS — J302 Other seasonal allergic rhinitis: Secondary | ICD-10-CM

## 2017-02-25 DIAGNOSIS — E1140 Type 2 diabetes mellitus with diabetic neuropathy, unspecified: Secondary | ICD-10-CM | POA: Insufficient documentation

## 2017-02-25 DIAGNOSIS — Z7902 Long term (current) use of antithrombotics/antiplatelets: Secondary | ICD-10-CM | POA: Insufficient documentation

## 2017-02-25 DIAGNOSIS — K219 Gastro-esophageal reflux disease without esophagitis: Secondary | ICD-10-CM | POA: Diagnosis not present

## 2017-02-25 DIAGNOSIS — Z992 Dependence on renal dialysis: Secondary | ICD-10-CM | POA: Insufficient documentation

## 2017-02-25 DIAGNOSIS — I2510 Atherosclerotic heart disease of native coronary artery without angina pectoris: Secondary | ICD-10-CM | POA: Diagnosis not present

## 2017-02-25 DIAGNOSIS — Z8673 Personal history of transient ischemic attack (TIA), and cerebral infarction without residual deficits: Secondary | ICD-10-CM | POA: Insufficient documentation

## 2017-02-25 DIAGNOSIS — Z9889 Other specified postprocedural states: Secondary | ICD-10-CM | POA: Insufficient documentation

## 2017-02-25 DIAGNOSIS — Z9049 Acquired absence of other specified parts of digestive tract: Secondary | ICD-10-CM | POA: Insufficient documentation

## 2017-02-25 DIAGNOSIS — Z91048 Other nonmedicinal substance allergy status: Secondary | ICD-10-CM | POA: Diagnosis not present

## 2017-02-25 DIAGNOSIS — E1151 Type 2 diabetes mellitus with diabetic peripheral angiopathy without gangrene: Secondary | ICD-10-CM | POA: Insufficient documentation

## 2017-02-25 DIAGNOSIS — I739 Peripheral vascular disease, unspecified: Secondary | ICD-10-CM

## 2017-02-25 DIAGNOSIS — N841 Polyp of cervix uteri: Secondary | ICD-10-CM

## 2017-02-25 DIAGNOSIS — Z9842 Cataract extraction status, left eye: Secondary | ICD-10-CM | POA: Insufficient documentation

## 2017-02-25 DIAGNOSIS — Z880 Allergy status to penicillin: Secondary | ICD-10-CM | POA: Insufficient documentation

## 2017-02-25 DIAGNOSIS — G43909 Migraine, unspecified, not intractable, without status migrainosus: Secondary | ICD-10-CM | POA: Diagnosis not present

## 2017-02-25 DIAGNOSIS — I132 Hypertensive heart and chronic kidney disease with heart failure and with stage 5 chronic kidney disease, or end stage renal disease: Secondary | ICD-10-CM | POA: Diagnosis not present

## 2017-02-25 DIAGNOSIS — I998 Other disorder of circulatory system: Secondary | ICD-10-CM | POA: Diagnosis not present

## 2017-02-25 DIAGNOSIS — Z951 Presence of aortocoronary bypass graft: Secondary | ICD-10-CM | POA: Insufficient documentation

## 2017-02-25 DIAGNOSIS — Z7982 Long term (current) use of aspirin: Secondary | ICD-10-CM | POA: Insufficient documentation

## 2017-02-25 DIAGNOSIS — E0851 Diabetes mellitus due to underlying condition with diabetic peripheral angiopathy without gangrene: Secondary | ICD-10-CM

## 2017-02-25 DIAGNOSIS — Z955 Presence of coronary angioplasty implant and graft: Secondary | ICD-10-CM | POA: Insufficient documentation

## 2017-02-25 LAB — POCT GLYCOSYLATED HEMOGLOBIN (HGB A1C): HEMOGLOBIN A1C: 6

## 2017-02-25 LAB — GLUCOSE, POCT (MANUAL RESULT ENTRY): POC GLUCOSE: 174 mg/dL — AB (ref 70–99)

## 2017-02-25 MED ORDER — BASAGLAR KWIKPEN 100 UNIT/ML ~~LOC~~ SOPN: PEN_INJECTOR | SUBCUTANEOUS | 3 refills | Status: DC

## 2017-02-25 MED ORDER — FLUTICASONE PROPIONATE 50 MCG/ACT NA SUSP: 2.0000 | Freq: Every day | NASAL | 3 refills | Status: DC | PRN

## 2017-02-25 MED ORDER — CLOPIDOGREL BISULFATE 75 MG PO TABS: 75.0000 mg | ORAL_TABLET | Freq: Every day | ORAL | 1 refills | Status: DC

## 2017-02-25 MED ORDER — ALLOPURINOL 300 MG PO TABS: 300.0000 mg | ORAL_TABLET | Freq: Every day | ORAL | 1 refills | Status: DC

## 2017-02-25 MED ORDER — ATORVASTATIN CALCIUM 40 MG PO TABS: 40.0000 mg | ORAL_TABLET | Freq: Every evening | ORAL | 1 refills | Status: DC

## 2017-02-25 NOTE — Patient Instructions (Signed)
Hypoglycemia Hypoglycemia is when the sugar (glucose) level in the blood is too low. Symptoms of low blood sugar may include:  Feeling: ? Hungry. ? Worried or nervous (anxious). ? Sweaty and clammy. ? Confused. ? Dizzy. ? Sleepy. ? Sick to your stomach (nauseous).  Having: ? A fast heartbeat. ? A headache. ? A change in your vision. ? Jerky movements that you cannot control (seizure). ? Nightmares. ? Tingling or no feeling (numbness) around the mouth, lips, or tongue.  Having trouble with: ? Talking. ? Paying attention (concentrating). ? Moving (coordination). ? Sleeping.  Shaking.  Passing out (fainting).  Getting upset easily (irritability).  Low blood sugar can happen to people who have diabetes and people who do not have diabetes. Low blood sugar can happen quickly, and it can be an emergency. Treating Low Blood Sugar Low blood sugar is often treated by eating or drinking something sugary right away. If you can think clearly and swallow safely, follow the 15:15 rule:  Take 15 grams of a fast-acting carb (carbohydrate). Some fast-acting carbs are: ? 1 tube of glucose gel. ? 3 sugar tablets (glucose pills). ? 6-8 pieces of hard candy. ? 4 oz (120 mL) of fruit juice. ? 4 oz (120 mL) of regular (not diet) soda.  Check your blood sugar 15 minutes after you take the carb.  If your blood sugar is still at or below 70 mg/dL (3.9 mmol/L), take 15 grams of a carb again.  If your blood sugar does not go above 70 mg/dL (3.9 mmol/L) after 3 tries, get help right away.  After your blood sugar goes back to normal, eat a meal or a snack within 1 hour.  Treating Very Low Blood Sugar If your blood sugar is at or below 54 mg/dL (3 mmol/L), you have very low blood sugar (severe hypoglycemia). This is an emergency. Do not wait to see if the symptoms will go away. Get medical help right away. Call your local emergency services (911 in the [place] Do not drive yourself to the  hospital. If you have very low blood sugar and you cannot eat or drink, you may need a glucagon shot (injection). A family member or friend should learn how to check your blood sugar and how to give you a glucagon shot. Ask your doctor if you need to have a glucagon shot kit at home. Follow these instructions at home: General instructions  Avoid any diets that cause you to not eat enough food. Talk with your doctor before you start any new diet.  Take over-the-counter and prescription medicines only as told by your doctor.  Limit alcohol to no more than 1 drink per day for nonpregnant women and 2 drinks per day for men. One drink equals 12 oz of beer, 5 oz of wine, or 1 oz of hard liquor.  Keep all follow-up visits as told by your doctor. This is important. If You Have Diabetes:   Make sure you know the symptoms of low blood sugar.  Always keep a source of sugar with you, such as: ? Sugar. ? Sugar tablets. ? Glucose gel. ? Fruit juice. ? Regular soda (not diet soda). ? Milk. ? Hard candy. ? Honey.  Take your medicines as told.  Follow your exercise and meal plan. ? Eat on time. Do not skip meals. ? Follow your sick day plan when you cannot eat or drink normally. Make this plan ahead of time with your doctor.  Check your blood sugar as often   as told by your doctor. Always check before and after exercise.  Share your diabetes care plan with: ? Your work or school. ? People you live with.  Check your pee (urine) for ketones: ? When you are sick. ? As told by your doctor.  Carry a card or wear jewelry that says you have diabetes. If You Have Low Blood Sugar From Other Causes:   Check your blood sugar as often as told by your doctor.  Follow instructions from your doctor about what you cannot eat or drink. Contact a doctor if:  You have trouble keeping your blood sugar in your target range.  You have low blood sugar often. Get help right away if:  You still have  symptoms after you eat or drink something sugary.  Your blood sugar is at or below 54 mg/dL (3 mmol/L).  You have jerky movements that you cannot control.  You pass out. These symptoms may be an emergency. Do not wait to see if the symptoms will go away. Get medical help right away. Call your local emergency services (911 in the [place] Do not drive yourself to the hospital. This information is not intended to replace advice given to you by your health care provider. Make sure you discuss any questions you have with your health care provider. Document Released: 06/26/2009 Document Revised: 09/07/2015 Document Reviewed: 05/05/2015 Elsevier Interactive Patient Education  [employer].

## 2017-02-25 NOTE — Progress Notes (Signed)
Subjective:  Patient ID: Brooke Stafford, female    DOB: 1955/05/05  Age: 61 y.o. MRN: [id number]  CC: Diabetes   HPI Brooke Stafford is a 61 year old female with a history of type 2 diabetes mellitus (A1c 6.0), hypertension, CHF (2-D echo 35 to 40% from 2-D echo of 04/2016), CAD status post PCI ,end-stage renal disease (currently on hemodialysis on Mondays, Wednesdays and Fridays), peripheral vascular disease (status post left SFA stent placement in 2012 and 2014 and right SFA and popliteal artery angioplasty and stent placement).  Seen by Dr. Fletcher Anon in 12/2016 for a follow-up visit - notes indicate occluded stent in left SFA but she is asymptomatic.  She denies claudication pains in both legs and has no shortness of breath or chest pain.  She complains of cough for the last 3 weeks with associated mucus in her chest and postnasal drip.  Her med list reveals she should be on Zyrtec and Flonase but she uses this intermittently.  With regards to her diabetes she has been compliant with her medications and denies hypoglycemia, visual concerns or numbness in extremities.  Last pelvic exam revealed presence of a cervical polyp and she would like further evaluation of this.  She tolerates her dialysis sessions and has no other concerns today.  Past Medical History:  Diagnosis Date  . Anemia   . Anginal pain ([place])   . Anxiety   . Arthritis    "stiff fingers and knees" (08/04/2013), (12/12/2014)  . Asthma   . CAD (coronary artery disease) 2002; 2015   CABG x 6 2002, cath 2011- med Rx stent DES VG-Diag  . CAD (coronary artery disease) of artery bypass graft; DES to VG-Diag 09/28/13 11/09/2013  . Cataract   . CHF (congestive heart failure) ([place])    "in 2002" (11/26/2012)  . Chronic bronchitis ([place])    "q year; in the winter"   . CKD (chronic kidney disease)    stage 4, followed by [facility]  . Coronary artery disease 2002   CABG x 6. Cath 5/11- med Rx  . Diabetic neuropathy ([place])   .  GERD (gastroesophageal reflux disease)   . Gout    "right big toe"  . Headache    "~ q week" (08/04/2013); "~ twice/month" (12/12/2014)  . History of blood transfusion 2002   "when I had OHS"  . Hyperlipidemia   . Hypertension   . Hypothyroid    treated  . Migraines    "couple times/year" (08/04/2013), (12/12/2014)  . Myocardial infarction ([place]) 2000; 2002; 2011  . Obesity (BMI 35.0-39.9 without comorbidity)   . Peripheral vascular disease ([place]) 12/12   LSFA PTA  . Pneumonia    "3 times I think" (12/12/2014)  . Stroke [facility])    " mini stroke"  . Type II diabetes mellitus ([place])     Past Surgical History:  Procedure Laterality Date  . ABDOMINAL AORTAGRAM N/A 04/05/2011   Procedure: ABDOMINAL AORTAGRAM;  Surgeon: Lorretta Harp, MD;  Location: [facility] CATH LAB;  Service: Cardiovascular;  Laterality: N/A;  . ABDOMINAL AORTOGRAM N/A 10/30/2016   Procedure: Abdominal Aortogram;  Surgeon: Wellington Hampshire, MD;  Location: [place] CV LAB;  Service: Cardiovascular;  Laterality: N/A;  . APPENDECTOMY  1980  . AV FISTULA PLACEMENT Left 05/10/2016   Procedure: LEFT ARM ARTERIOVENOUS (AV) FISTULA CREATION;  Surgeon: Rosetta Posner, MD;  Location: [place];  Service: Vascular;  Laterality: Left;  . BASCILIC VEIN TRANSPOSITION Left 06/26/2016   Procedure: SECOND  STAGE BASILIC VEIN TRANSPOSITION;  Surgeon: Rosetta Posner, MD;  Location: [place];  Service: Vascular;  Laterality: Left;  . BREAST CYST EXCISION Right 1970's  . CARDIAC CATHETERIZATION  2002  . CARDIAC CATHETERIZATION N/A 12/12/2014   Procedure: Left Heart Cath and Cors/Grafts Angiography;  Surgeon: Peter M Martinique, MD;  Location: [place] CV LAB;  Service: Cardiovascular;  Laterality: N/A;  . CARDIAC CATHETERIZATION N/A 12/12/2014   Procedure: Coronary Stent Intervention;  Surgeon: Peter M Martinique, MD;  Location: [place] CV LAB;  Service: Cardiovascular;  Laterality: N/A;  . CATARACT EXTRACTION, BILATERAL    . CESAREAN SECTION  1978; 1980  .  CHOLECYSTECTOMY  1982  . CORONARY ANGIOPLASTY WITH STENT PLACEMENT  2004; 2012   "I have 2 stents" (08/04/2013)  . CORONARY ANGIOPLASTY WITH STENT PLACEMENT  09/28/13   PTCA/ DES Xience stent to VG-Diag   . CORONARY ARTERY BYPASS GRAFT  11/20/2000   x6 LIMA to distal LAD, svg to first diag, svg to ramus intermediate branch and swquential SVG to cir marginal branch, SVG to posterior descending coronary and sequential SVG to first right posterolateral branch  . eye injections    . INSERTION OF DIALYSIS CATHETER Right 05/10/2016   Procedure: INSERTION OF DIALYSIS CATHETER - Right Internal Jugular Placement;  Surgeon: Rosetta Posner, MD;  Location: [place];  Service: Vascular;  Laterality: Right;  . LEFT HEART CATHETERIZATION WITH CORONARY/GRAFT ANGIOGRAM N/A 09/28/2013   Procedure: LEFT HEART CATHETERIZATION WITH Beatrix Fetters;  Surgeon: Peter M Martinique, MD;  Location: [facility] CATH LAB;  Service: Cardiovascular;  Laterality: N/A;  . LOWER EXTREMITY ANGIOGRAM  12/01/2012   Procedure: LOWER EXTREMITY ANGIOGRAM;  Surgeon: Lorretta Harp, MD;  Location[facility] C[facility] LAB;  Service: Cardiovascular;;  . LOWER EXTREMITY ANGIOGRAPHY Right 10/30/2016   Procedure: Lower Extremity Angiography;  Surgeon: Wellington Hampshire, MD;  Location: [place] CV LAB;  Service: Cardiovascular;  Laterality: Right;  . NM MYOCAR PERF WALL MOTION  08/27/2004   negative  . PERCUTANEOUS STENT INTERVENTION Left 12/01/2012   Procedure: PERCUTANEOUS STENT INTERVENTION;  Surgeon: Lorretta Harp, MD;  Location: [facility] CATH LAB;  Service: Cardiovascular;  Laterality: Left;  Left SFA  . PERIPHERAL ARTERIAL STENT GRAFT Left    SFA/notes 04/07/2011 (11/30/2012)  . PERIPHERAL VASCULAR ATHERECTOMY Right 10/30/2016   Procedure: Peripheral Vascular Atherectomy;  Surgeon: Wellington Hampshire, MD;  Location: [place] CV LAB;  Service: Cardiovascular;  Laterality: Right;  cancel unable to  . PERIPHERAL VASCULAR INTERVENTION Right 10/30/2016   Procedure:  Peripheral Vascular Intervention;  Surgeon: Wellington Hampshire, MD;  Location: [place] CV LAB;  Service: Cardiovascular;  Laterality: Right;  SFA  . RENAL ANGIOGRAM N/A 04/05/2011   Procedure: RENAL ANGIOGRAM;  Surgeon: Lorretta Harp, MD;  Location: [facility] CATH LAB;  Service: Cardiovascular;  Laterality: N/A;  . TUBAL LIGATION  1980    Allergies  Allergen Reactions  . Digoxin And Related Diarrhea and Other (See Comments)    TOXIC DRUG LEVELS Patient stated she almost died. Had flu like symptoms as well as diarrhea.  . Hydralazine Shortness Of Breath  . Penicillins Cross Reactors Hives and Other (See Comments)    HIGH FEVER  Has patient had a PCN reaction causing immediate rash, facial/tongue/throat swelling, SOB or lightheadedness with hypotension: No Has patient had a PCN reaction causing SEVERE RASH INVOLVING MUCUS MEMBRANES or SKIN NECROSIS  #  #  #  YES  #  #  #  Has patient had  a PCN reaction that required hospitalization: Unk Has patient had a PCN reaction occurring within the last 10 years: Unk If all of the above answers are "NO", then may proceed with Cephalosporin use.   [name] Lisinopril Other (See Comments)    Felt like she had the flu. Was very sick!!!  . Adhesive [Tape] Rash and Other (See Comments)    bruising     Outpatient Medications Prior to Visit  Medication Sig Dispense Refill  . acetaminophen-codeine (TYLENOL #3) 300-30 MG tablet Take 1 tablet by mouth 2 (two) times daily as needed for moderate pain.    [name] albuterol (PROVENTIL HFA;VENTOLIN HFA) 108 (90 Base) MCG/ACT inhaler Inhale 2 puffs every 4 (four) hours as needed into the lungs for wheezing or shortness of breath. 1 Inhaler 0  . aspirin 81 MG chewable tablet Chew 1 tablet (81 mg total) by mouth daily. 30 tablet 10  . gabapentin (NEURONTIN) 400 MG capsule Take 400 mg by mouth at bedtime.     . insulin aspart (NOVOLOG) 100 UNIT/ML injection Inject 25-30 Units 3 (three) times daily before meals into the skin.  Pens 15 mL 0  . isosorbide mononitrate (IMDUR) 60 MG 24 hr tablet Take 30 mg by mouth 2 (two) times daily.     [name] levothyroxine (SYNTHROID, LEVOTHROID) 25 MCG tablet take 1 tablet by mouth every morning before BREAKFAST 30 tablet 2  . lidocaine-prilocaine (EMLA) cream Apply 1 application topically as directed.  1  . metoprolol tartrate (LOPRESSOR) 25 MG tablet Take 0.5 tablets (12.5 mg total) by mouth 2 (two) times daily. 90 tablet 3  . Naphazoline HCl (CLEAR EYES OP) Place 1 drop into both eyes as needed (for dry eyes).    . nitroGLYCERIN (NITROSTAT) 0.4 MG SL tablet Place 1 tablet (0.4 mg total) under the tongue every 5 (five) minutes as needed. (Patient taking differently: Place 0.4 mg under the tongue every 5 (five) minutes as needed for chest pain. ) 30 tablet 2  . RENVELA 800 MG tablet Take 3 tablets (2,400 mg) by mouth three (3) times daily.  0  . senna-docusate (SENOKOT-S) 8.6-50 MG tablet Take 1 tablet by mouth at bedtime as needed for mild constipation. 30 tablet 0  . sevelamer (RENAGEL) 800 MG tablet Take 2,400 mg by mouth 3 (three) times daily with meals.    . traMADol (ULTRAM) 50 MG tablet Take 1 tablet (50 mg total) by mouth every 12 (twelve) hours as needed. 40 tablet 1  . triamcinolone ointment (KENALOG) 0.5 % Apply 1 application topically daily.    [name] allopurinol (ZYLOPRIM) 300 MG tablet Take 1 tablet (300 mg total) by mouth daily. 90 tablet 3  . atorvastatin (LIPITOR) 40 MG tablet Take 1 tablet (40 mg total) by mouth every evening. 90 tablet 0  . citalopram (CELEXA) 20 MG tablet Take 1 tablet (20 mg total) by mouth daily. (Patient taking differently: Take 20 mg by mouth at bedtime. ) 30 tablet 3  . clopidogrel (PLAVIX) 75 MG tablet take 1 tablet by mouth once daily (Patient taking differently: take 75 mg by mouth at bedtime) 90 tablet 3  . fluticasone (FLONASE) 50 MCG/ACT nasal spray Place 2 sprays into both nostrils daily as needed for allergies or rhinitis. 16 g 3  . hydrOXYzine  (ATARAX/VISTARIL) 25 MG tablet take 1 tablet by mouth three times a day (Patient taking differently: take 25 mg by mouth three times a day) 90 tablet 0  . Insulin Glargine (BASAGLAR KWIKPEN) 100 UNIT/ML SOPN  INJECT 28 UNITS INTO THE SKIN AT BEDTIME 45 mL 3  . levothyroxine (SYNTHROID, LEVOTHROID) 25 MCG tablet take 1 tablet by mouth every morning before BREAKFAST 30 tablet 2  . ranitidine (ZANTAC 75) 75 MG tablet Take 1 tablet (75 mg total) by mouth daily as needed for heartburn.    . cadexomer iodine (IODOSORB) 0.9 % gel Apply 1 application topically daily as needed for wound care. (Patient not taking: Reported on 02/25/2017) 40 g 0  . loratadine (CLARITIN) 10 MG tablet Take 10 mg by mouth daily.    [name] ezetimibe (ZETIA) 10 MG tablet Take 1 tablet (10 mg total) by mouth daily. (Patient not taking: Reported on 02/25/2017) 90 tablet 3  . ferric gluconate 125 mg in sodium chloride 0.9 % 100 mL Inject 125 mg into the vein Every Tuesday,Thursday,and Saturday with dialysis. (Patient not taking: Reported on 02/25/2017)     No facility-administered medications prior to visit.     ROS Review of Systems  Constitutional: Negative for activity change, appetite change and fatigue.  HENT: Negative for congestion, sinus pressure and sore throat.   Eyes: Negative for visual disturbance.  Respiratory: Positive for cough. Negative for chest tightness, shortness of breath and wheezing.   Cardiovascular: Negative for chest pain and palpitations.  Gastrointestinal: Negative for abdominal distention, abdominal pain and constipation.  Endocrine: Negative for polydipsia.  Genitourinary: Negative for dysuria and frequency.  Musculoskeletal: Negative for arthralgias and back pain.  Skin: Negative for rash.  Neurological: Negative for tremors, light-headedness and numbness.  Hematological: Does not bruise/bleed easily.  Psychiatric/Behavioral: Negative for agitation and behavioral problems.    Objective:  BP  117/65   Pulse 95   Temp 98.5 F (36.9 C) (Oral)   Ht 5\' 10"  (1.778 m)   Wt 221 lb 9.6 oz (100.5 kg)   LMP 10/13/2014 (Exact Date)   SpO2 96%   BMI 31.80 kg/m   BP/Weight 02/25/2017 02/18/2017 78/07/6960  Systolic BP 952 841 324  Diastolic BP 65 72 47  Wt. (Lbs) 221.6 - -  BMI 31.8 - -      Physical Exam  Constitutional: She is oriented to person, place, and time. She appears well-developed and well-nourished.  Cardiovascular: Normal rate, normal heart sounds and intact distal pulses.  No murmur heard. Pulmonary/Chest: Effort normal and breath sounds normal. She has no wheezes. She has no rales. She exhibits no tenderness.  Abdominal: Soft. Bowel sounds are normal. She exhibits no distension and no mass. There is no tenderness.  Musculoskeletal: Normal range of motion.  Left arm AV fistula with palpable thrill  Neurological: She is alert and oriented to person, place, and time.  Skin: Skin is warm and dry.  Psychiatric: She has a normal mood and affect.     Lab Results  Component Value Date   HGBA1C 6.0 02/25/2017    Lab Results  Component Value Date   TSH 3.220 10/22/2015    Assessment & Plan:   1. Diabetes mellitus due to underlying condition with diabetic peripheral angiopathy without gangrene, with long-term current use of insulin (HCC) Controlled with A1c of 6.0 Continue current management Diabetic diet, lifestyle modifications - POCT glucose (manual entry) - POCT glycosylated hemoglobin (Hb A1C)  2. Seasonal allergies Could explain her cough Advised to use Zyrtec and Flonase consistently daily - fluticasone (FLONASE) 50 MCG/ACT nasal spray; Place 2 sprays daily as needed into both nostrils for allergies or rhinitis.  Dispense: 16 g; Refill: 3  3. Gout of big toe  No acute flares - allopurinol (ZYLOPRIM) 300 MG tablet; Take 1 tablet (300 mg total) daily by mouth.  Dispense: 90 tablet; Refill: 1  4. Cervical polyp Polyp seen on pelvic exam at last  visit Asymptomatic - US Transvaginal Non-OB; Future  5. Hypothyroidism, acquired Controlled - T4, free - TSH  6. CAD -S/P PCI June 2015 and 12/14/14 Stable, no angina Risk factor modification Continue Plavix and statin  7. ESRD (end stage renal disease) ([place]) Continue current hemodialysis schedule  8. PVD, LSFA PTA 12/12 Status post right SFA PCI secondary to critical limb ischemia Absence of claudication pain Continue Plavix and statin   Meds ordered this encounter  Medications  . Insulin Glargine (BASAGLAR KWIKPEN) 100 UNIT/ML SOPN    Sig: INJECT 23 UNITS INTO THE SKIN AT BEDTIME    Dispense:  45 mL    Refill:  3  . fluticasone (FLONASE) 50 MCG/ACT nasal spray    Sig: Place 2 sprays daily as needed into both nostrils for allergies or rhinitis.    Dispense:  16 g    Refill:  3  . clopidogrel (PLAVIX) 75 MG tablet    Sig: Take 1 tablet (75 mg total) daily by mouth.    Dispense:  90 tablet    Refill:  1  . allopurinol (ZYLOPRIM) 300 MG tablet    Sig: Take 1 tablet (300 mg total) daily by mouth.    Dispense:  90 tablet    Refill:  1  . atorvastatin (LIPITOR) 40 MG tablet    Sig: Take 1 tablet (40 mg total) every evening by mouth.    Dispense:  90 tablet    Refill:  1    Follow-up: Return in about 3 months (around 05/28/2017) for Follow up of chronic medical conditions.   Arnoldo Morale MD

## 2017-02-26 ENCOUNTER — Other Ambulatory Visit: Payer: Self-pay | Admitting: Family Medicine

## 2017-02-26 DIAGNOSIS — D509 Iron deficiency anemia, unspecified: Secondary | ICD-10-CM | POA: Diagnosis not present

## 2017-02-26 DIAGNOSIS — N186 End stage renal disease: Secondary | ICD-10-CM | POA: Diagnosis not present

## 2017-02-26 DIAGNOSIS — D631 Anemia in chronic kidney disease: Secondary | ICD-10-CM | POA: Diagnosis not present

## 2017-02-26 DIAGNOSIS — N2581 Secondary hyperparathyroidism of renal origin: Secondary | ICD-10-CM | POA: Diagnosis not present

## 2017-02-26 DIAGNOSIS — E1129 Type 2 diabetes mellitus with other diabetic kidney complication: Secondary | ICD-10-CM | POA: Diagnosis not present

## 2017-02-26 LAB — TSH: TSH: 1.32 u[IU]/mL (ref 0.450–4.500)

## 2017-02-26 LAB — T4, FREE: FREE T4: 1.1 ng/dL (ref 0.82–1.77)

## 2017-02-26 MED ORDER — LEVOTHYROXINE SODIUM 25 MCG PO TABS: ORAL_TABLET | ORAL | 2 refills | Status: DC

## 2017-02-27 ENCOUNTER — Encounter: Payer: Self-pay | Admitting: Family Medicine

## 2017-02-28 DIAGNOSIS — E1129 Type 2 diabetes mellitus with other diabetic kidney complication: Secondary | ICD-10-CM | POA: Diagnosis not present

## 2017-02-28 DIAGNOSIS — N186 End stage renal disease: Secondary | ICD-10-CM | POA: Diagnosis not present

## 2017-02-28 DIAGNOSIS — N2581 Secondary hyperparathyroidism of renal origin: Secondary | ICD-10-CM | POA: Diagnosis not present

## 2017-02-28 DIAGNOSIS — D631 Anemia in chronic kidney disease: Secondary | ICD-10-CM | POA: Diagnosis not present

## 2017-02-28 DIAGNOSIS — D509 Iron deficiency anemia, unspecified: Secondary | ICD-10-CM | POA: Diagnosis not present

## 2017-03-02 DIAGNOSIS — N2581 Secondary hyperparathyroidism of renal origin: Secondary | ICD-10-CM | POA: Diagnosis not present

## 2017-03-02 DIAGNOSIS — D631 Anemia in chronic kidney disease: Secondary | ICD-10-CM | POA: Diagnosis not present

## 2017-03-02 DIAGNOSIS — N186 End stage renal disease: Secondary | ICD-10-CM | POA: Diagnosis not present

## 2017-03-02 DIAGNOSIS — D509 Iron deficiency anemia, unspecified: Secondary | ICD-10-CM | POA: Diagnosis not present

## 2017-03-02 DIAGNOSIS — E1129 Type 2 diabetes mellitus with other diabetic kidney complication: Secondary | ICD-10-CM | POA: Diagnosis not present

## 2017-03-03 ENCOUNTER — Ambulatory Visit (HOSPITAL_COMMUNITY): Payer: Medicare Other

## 2017-03-04 ENCOUNTER — Ambulatory Visit: Payer: Medicare Other | Admitting: Internal Medicine

## 2017-03-04 ENCOUNTER — Ambulatory Visit (HOSPITAL_COMMUNITY): Payer: Medicare Other

## 2017-03-04 DIAGNOSIS — E1129 Type 2 diabetes mellitus with other diabetic kidney complication: Secondary | ICD-10-CM | POA: Diagnosis not present

## 2017-03-04 DIAGNOSIS — D631 Anemia in chronic kidney disease: Secondary | ICD-10-CM | POA: Diagnosis not present

## 2017-03-04 DIAGNOSIS — N186 End stage renal disease: Secondary | ICD-10-CM | POA: Diagnosis not present

## 2017-03-04 DIAGNOSIS — D509 Iron deficiency anemia, unspecified: Secondary | ICD-10-CM | POA: Diagnosis not present

## 2017-03-04 DIAGNOSIS — N2581 Secondary hyperparathyroidism of renal origin: Secondary | ICD-10-CM | POA: Diagnosis not present

## 2017-03-07 DIAGNOSIS — N186 End stage renal disease: Secondary | ICD-10-CM | POA: Diagnosis not present

## 2017-03-07 DIAGNOSIS — D509 Iron deficiency anemia, unspecified: Secondary | ICD-10-CM | POA: Diagnosis not present

## 2017-03-07 DIAGNOSIS — D631 Anemia in chronic kidney disease: Secondary | ICD-10-CM | POA: Diagnosis not present

## 2017-03-07 DIAGNOSIS — N2581 Secondary hyperparathyroidism of renal origin: Secondary | ICD-10-CM | POA: Diagnosis not present

## 2017-03-07 DIAGNOSIS — E1129 Type 2 diabetes mellitus with other diabetic kidney complication: Secondary | ICD-10-CM | POA: Diagnosis not present

## 2017-03-10 DIAGNOSIS — D509 Iron deficiency anemia, unspecified: Secondary | ICD-10-CM | POA: Diagnosis not present

## 2017-03-10 DIAGNOSIS — N186 End stage renal disease: Secondary | ICD-10-CM | POA: Diagnosis not present

## 2017-03-10 DIAGNOSIS — N2581 Secondary hyperparathyroidism of renal origin: Secondary | ICD-10-CM | POA: Diagnosis not present

## 2017-03-10 DIAGNOSIS — E1129 Type 2 diabetes mellitus with other diabetic kidney complication: Secondary | ICD-10-CM | POA: Diagnosis not present

## 2017-03-10 DIAGNOSIS — D631 Anemia in chronic kidney disease: Secondary | ICD-10-CM | POA: Diagnosis not present

## 2017-03-11 ENCOUNTER — Encounter: Payer: Self-pay | Admitting: Internal Medicine

## 2017-03-11 ENCOUNTER — Ambulatory Visit (INDEPENDENT_AMBULATORY_CARE_PROVIDER_SITE_OTHER): Payer: Medicare Other | Admitting: Internal Medicine

## 2017-03-11 VITALS — BP 132/70 | HR 77 | Wt 226.0 lb

## 2017-03-11 DIAGNOSIS — N186 End stage renal disease: Secondary | ICD-10-CM

## 2017-03-11 DIAGNOSIS — I2510 Atherosclerotic heart disease of native coronary artery without angina pectoris: Secondary | ICD-10-CM

## 2017-03-11 DIAGNOSIS — Z9861 Coronary angioplasty status: Secondary | ICD-10-CM | POA: Diagnosis not present

## 2017-03-11 DIAGNOSIS — L97519 Non-pressure chronic ulcer of other part of right foot with unspecified severity: Secondary | ICD-10-CM | POA: Diagnosis not present

## 2017-03-11 DIAGNOSIS — E1122 Type 2 diabetes mellitus with diabetic chronic kidney disease: Secondary | ICD-10-CM | POA: Diagnosis not present

## 2017-03-11 NOTE — Patient Instructions (Signed)
Please continue: - Lantus 23 units at bedtime  Please change: - Novolog  Before b'fast: 25 units Before lunch: 20 units Before dinner: 20 units If sugars before a meal are 80-150: take the whole amount of mealtime insulin with that meal, but not the Sliding scale If sugars are 60-80, take only half of the mealtime insulin and no sliding scale. If sugars <60, do not take any insulin with that meal. - NovoLog SSI: - 150- 165: + 1 unit  - 166- 180: + 2 units  - 181- 195: + 3 units  - 196- 210: + 4 units  - >210: + 5 units  Please return in 3 months with your sugar log.
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
## 2017-03-12 DIAGNOSIS — E1129 Type 2 diabetes mellitus with other diabetic kidney complication: Secondary | ICD-10-CM | POA: Diagnosis not present

## 2017-03-12 DIAGNOSIS — D631 Anemia in chronic kidney disease: Secondary | ICD-10-CM | POA: Diagnosis not present

## 2017-03-12 DIAGNOSIS — D509 Iron deficiency anemia, unspecified: Secondary | ICD-10-CM | POA: Diagnosis not present

## 2017-03-12 DIAGNOSIS — N186 End stage renal disease: Secondary | ICD-10-CM | POA: Diagnosis not present

## 2017-03-12 DIAGNOSIS — N2581 Secondary hyperparathyroidism of renal origin: Secondary | ICD-10-CM | POA: Diagnosis not present

## 2017-03-13 ENCOUNTER — Ambulatory Visit (HOSPITAL_COMMUNITY)
Admission: RE | Admit: 2017-03-13 | Discharge: 2017-03-13 | Disposition: A | Discharge status: Home / Self Care | Payer: Medicare Other | Source: Ambulatory Visit | Attending: Family Medicine | Admitting: Family Medicine

## 2017-03-13 DIAGNOSIS — N841 Polyp of cervix uteri: Secondary | ICD-10-CM | POA: Diagnosis not present

## 2017-03-13 DIAGNOSIS — D251 Intramural leiomyoma of uterus: Secondary | ICD-10-CM | POA: Diagnosis not present

## 2017-03-14 DIAGNOSIS — D509 Iron deficiency anemia, unspecified: Secondary | ICD-10-CM | POA: Diagnosis not present

## 2017-03-14 DIAGNOSIS — D631 Anemia in chronic kidney disease: Secondary | ICD-10-CM | POA: Diagnosis not present

## 2017-03-14 DIAGNOSIS — N2581 Secondary hyperparathyroidism of renal origin: Secondary | ICD-10-CM | POA: Diagnosis not present

## 2017-03-14 DIAGNOSIS — N186 End stage renal disease: Secondary | ICD-10-CM | POA: Diagnosis not present

## 2017-03-14 DIAGNOSIS — I129 Hypertensive chronic kidney disease with stage 1 through stage 4 chronic kidney disease, or unspecified chronic kidney disease: Secondary | ICD-10-CM | POA: Diagnosis not present

## 2017-03-14 DIAGNOSIS — E1129 Type 2 diabetes mellitus with other diabetic kidney complication: Secondary | ICD-10-CM | POA: Diagnosis not present

## 2017-03-14 DIAGNOSIS — Z992 Dependence on renal dialysis: Secondary | ICD-10-CM | POA: Diagnosis not present

## 2017-03-17 DIAGNOSIS — N186 End stage renal disease: Secondary | ICD-10-CM | POA: Diagnosis not present

## 2017-03-17 DIAGNOSIS — N2581 Secondary hyperparathyroidism of renal origin: Secondary | ICD-10-CM | POA: Diagnosis not present

## 2017-03-17 DIAGNOSIS — E1129 Type 2 diabetes mellitus with other diabetic kidney complication: Secondary | ICD-10-CM | POA: Diagnosis not present

## 2017-03-17 DIAGNOSIS — D509 Iron deficiency anemia, unspecified: Secondary | ICD-10-CM | POA: Diagnosis not present

## 2017-03-18 ENCOUNTER — Other Ambulatory Visit: Payer: Self-pay | Admitting: Internal Medicine

## 2017-03-18 DIAGNOSIS — F330 Major depressive disorder, recurrent, mild: Secondary | ICD-10-CM

## 2017-03-19 DIAGNOSIS — E1129 Type 2 diabetes mellitus with other diabetic kidney complication: Secondary | ICD-10-CM | POA: Diagnosis not present

## 2017-03-19 DIAGNOSIS — N186 End stage renal disease: Secondary | ICD-10-CM | POA: Diagnosis not present

## 2017-03-19 DIAGNOSIS — D509 Iron deficiency anemia, unspecified: Secondary | ICD-10-CM | POA: Diagnosis not present

## 2017-03-19 DIAGNOSIS — N2581 Secondary hyperparathyroidism of renal origin: Secondary | ICD-10-CM | POA: Diagnosis not present

## 2017-03-21 DIAGNOSIS — D509 Iron deficiency anemia, unspecified: Secondary | ICD-10-CM | POA: Diagnosis not present

## 2017-03-21 DIAGNOSIS — E1129 Type 2 diabetes mellitus with other diabetic kidney complication: Secondary | ICD-10-CM | POA: Diagnosis not present

## 2017-03-21 DIAGNOSIS — N186 End stage renal disease: Secondary | ICD-10-CM | POA: Diagnosis not present

## 2017-03-21 DIAGNOSIS — N2581 Secondary hyperparathyroidism of renal origin: Secondary | ICD-10-CM | POA: Diagnosis not present

## 2017-03-24 DIAGNOSIS — N2581 Secondary hyperparathyroidism of renal origin: Secondary | ICD-10-CM | POA: Diagnosis not present

## 2017-03-24 DIAGNOSIS — N186 End stage renal disease: Secondary | ICD-10-CM | POA: Diagnosis not present

## 2017-03-24 DIAGNOSIS — D509 Iron deficiency anemia, unspecified: Secondary | ICD-10-CM | POA: Diagnosis not present

## 2017-03-24 DIAGNOSIS — E1129 Type 2 diabetes mellitus with other diabetic kidney complication: Secondary | ICD-10-CM | POA: Diagnosis not present

## 2017-03-25 ENCOUNTER — Other Ambulatory Visit: Payer: Self-pay | Admitting: Internal Medicine

## 2017-03-25 DIAGNOSIS — I871 Compression of vein: Secondary | ICD-10-CM | POA: Diagnosis not present

## 2017-03-25 DIAGNOSIS — I5042 Chronic combined systolic (congestive) and diastolic (congestive) heart failure: Secondary | ICD-10-CM

## 2017-03-25 DIAGNOSIS — Z992 Dependence on renal dialysis: Secondary | ICD-10-CM | POA: Diagnosis not present

## 2017-03-25 DIAGNOSIS — I10 Essential (primary) hypertension: Secondary | ICD-10-CM

## 2017-03-25 DIAGNOSIS — N186 End stage renal disease: Secondary | ICD-10-CM | POA: Diagnosis not present

## 2017-03-26 ENCOUNTER — Emergency Department (HOSPITAL_COMMUNITY): Payer: Medicare Other

## 2017-03-26 ENCOUNTER — Encounter (HOSPITAL_COMMUNITY): Payer: Self-pay | Admitting: Emergency Medicine

## 2017-03-26 DIAGNOSIS — Z87891 Personal history of nicotine dependence: Secondary | ICD-10-CM | POA: Diagnosis not present

## 2017-03-26 DIAGNOSIS — E1140 Type 2 diabetes mellitus with diabetic neuropathy, unspecified: Secondary | ICD-10-CM | POA: Diagnosis not present

## 2017-03-26 DIAGNOSIS — R079 Chest pain, unspecified: Secondary | ICD-10-CM | POA: Diagnosis not present

## 2017-03-26 DIAGNOSIS — I2510 Atherosclerotic heart disease of native coronary artery without angina pectoris: Secondary | ICD-10-CM | POA: Insufficient documentation

## 2017-03-26 DIAGNOSIS — R1013 Epigastric pain: Secondary | ICD-10-CM | POA: Diagnosis not present

## 2017-03-26 DIAGNOSIS — Z8673 Personal history of transient ischemic attack (TIA), and cerebral infarction without residual deficits: Secondary | ICD-10-CM | POA: Diagnosis not present

## 2017-03-26 DIAGNOSIS — Z951 Presence of aortocoronary bypass graft: Secondary | ICD-10-CM | POA: Insufficient documentation

## 2017-03-26 DIAGNOSIS — Z794 Long term (current) use of insulin: Secondary | ICD-10-CM | POA: Diagnosis not present

## 2017-03-26 DIAGNOSIS — J449 Chronic obstructive pulmonary disease, unspecified: Secondary | ICD-10-CM | POA: Insufficient documentation

## 2017-03-26 DIAGNOSIS — R1010 Upper abdominal pain, unspecified: Secondary | ICD-10-CM | POA: Diagnosis present

## 2017-03-26 DIAGNOSIS — Z79899 Other long term (current) drug therapy: Secondary | ICD-10-CM | POA: Insufficient documentation

## 2017-03-26 DIAGNOSIS — N184 Chronic kidney disease, stage 4 (severe): Secondary | ICD-10-CM | POA: Diagnosis not present

## 2017-03-26 DIAGNOSIS — Z7902 Long term (current) use of antithrombotics/antiplatelets: Secondary | ICD-10-CM | POA: Insufficient documentation

## 2017-03-26 DIAGNOSIS — D509 Iron deficiency anemia, unspecified: Secondary | ICD-10-CM | POA: Diagnosis not present

## 2017-03-26 DIAGNOSIS — R109 Unspecified abdominal pain: Secondary | ICD-10-CM | POA: Diagnosis not present

## 2017-03-26 DIAGNOSIS — E785 Hyperlipidemia, unspecified: Secondary | ICD-10-CM | POA: Insufficient documentation

## 2017-03-26 DIAGNOSIS — Z7982 Long term (current) use of aspirin: Secondary | ICD-10-CM | POA: Diagnosis not present

## 2017-03-26 DIAGNOSIS — N186 End stage renal disease: Secondary | ICD-10-CM | POA: Diagnosis not present

## 2017-03-26 DIAGNOSIS — E039 Hypothyroidism, unspecified: Secondary | ICD-10-CM | POA: Diagnosis not present

## 2017-03-26 DIAGNOSIS — M1000 Idiopathic gout, unspecified site: Secondary | ICD-10-CM | POA: Diagnosis not present

## 2017-03-26 DIAGNOSIS — I130 Hypertensive heart and chronic kidney disease with heart failure and stage 1 through stage 4 chronic kidney disease, or unspecified chronic kidney disease: Secondary | ICD-10-CM | POA: Diagnosis not present

## 2017-03-26 DIAGNOSIS — I5022 Chronic systolic (congestive) heart failure: Secondary | ICD-10-CM | POA: Insufficient documentation

## 2017-03-26 DIAGNOSIS — N2581 Secondary hyperparathyroidism of renal origin: Secondary | ICD-10-CM | POA: Diagnosis not present

## 2017-03-26 DIAGNOSIS — E1129 Type 2 diabetes mellitus with other diabetic kidney complication: Secondary | ICD-10-CM | POA: Diagnosis not present

## 2017-03-26 LAB — BASIC METABOLIC PANEL
ANION GAP: 13 (ref 5–15)
BUN: 18 mg/dL (ref 6–20)
CHLORIDE: 92 mmol/L — AB (ref 101–111)
CO2: 31 mmol/L (ref 22–32)
Calcium: 8.7 mg/dL — ABNORMAL LOW (ref 8.9–10.3)
Creatinine, Ser: 6.28 mg/dL — ABNORMAL HIGH (ref 0.44–1.00)
GFR calc Af Amer: 7 mL/min — ABNORMAL LOW (ref >60)
GFR calc non Af Amer: 6 mL/min — ABNORMAL LOW (ref >60)
GLUCOSE: 179 mg/dL — AB (ref 65–99)
POTASSIUM: 3.3 mmol/L — AB (ref 3.5–5.1)
SODIUM: 136 mmol/L (ref 135–145)

## 2017-03-26 LAB — CBC
HEMATOCRIT: 37.3 % (ref 36.0–46.0)
Hemoglobin: 12.3 g/dL (ref 12.0–15.0)
MCH: 33.8 pg (ref 26.0–34.0)
MCHC: 33 g/dL (ref 30.0–36.0)
MCV: 102.5 fL — AB (ref 78.0–100.0)
Platelets: 169 10*3/uL (ref 150–400)
RBC: 3.64 MIL/uL — ABNORMAL LOW (ref 3.87–5.11)
RDW: 14.8 % (ref 11.5–15.5)
WBC: 6.1 10*3/uL (ref 4.0–10.5)

## 2017-03-26 LAB — I-STAT TROPONIN, ED: Troponin i, poc: 0.02 ng/mL (ref 0.00–0.08)

## 2017-03-26 NOTE — ED Triage Notes (Signed)
Patient rep[orts upper abdominal pain and left lower chest pain onset today with diaphoresis , denies SOB , no nausea or fever . Hemodialysis ( Mon/Wed/Fri ) today .

## 2017-03-27 ENCOUNTER — Emergency Department (HOSPITAL_COMMUNITY)
Admission: EM | Admit: 2017-03-27 | Discharge: 2017-03-27 | Disposition: A | Discharge status: Home / Self Care | Payer: Medicare Other | Attending: Emergency Medicine | Admitting: Emergency Medicine

## 2017-03-27 ENCOUNTER — Other Ambulatory Visit: Payer: Self-pay

## 2017-03-27 DIAGNOSIS — R1013 Epigastric pain: Secondary | ICD-10-CM | POA: Diagnosis not present

## 2017-03-27 MED ORDER — GI COCKTAIL ~~LOC~~
30.0000 mL | Freq: Once | ORAL | Status: AC
Start: 2017-03-27 — End: 2017-03-27
  Administered 2017-03-27: 30 mL via ORAL
  Filled 2017-03-27: qty 30

## 2017-03-27 MED ORDER — INSULIN ASPART 100 UNIT/ML FLEXPEN: PEN_INJECTOR | SUBCUTANEOUS | 0 refills | Status: DC

## 2017-03-27 MED ORDER — PANTOPRAZOLE SODIUM 40 MG PO TBEC
40.0000 mg | DELAYED_RELEASE_TABLET | Freq: Once | ORAL | Status: AC
  Administered 2017-03-27: 40 mg via ORAL
  Filled 2017-03-27: qty 1

## 2017-03-27 MED ORDER — PANTOPRAZOLE SODIUM 40 MG PO TBEC: 40.0000 mg | DELAYED_RELEASE_TABLET | Freq: Every day | ORAL | 0 refills | Status: DC

## 2017-03-27 NOTE — Discharge Instructions (Signed)
You may take an antacid (Tums, Pepto Bismol, Mylanta, Maalox, etc.) as needed.  Return if symptoms are getting worse.

## 2017-03-27 NOTE — ED Provider Notes (Signed)
[facility] EMERGENCY DEPARTMENT Provider Note   CSN: [id number] Arrival date & time: 03/26/17  2204     History   Chief Complaint Chief Complaint  Patient presents with  . Abdominal Pain  . Chest Pain    HPI Brooke Stafford is a 61 y.o. female.  The history is provided by the patient.  She has an extensive medical history including coronary artery disease, congestive heart failure, end-stage renal disease, hyperlipidemia, GERD, hypertension, diabetes.  She had received her usual dialysis earlier today.  At about 6 PM, she developed some upper abdominal pain without radiation.  Although triage note states pain went into her chest, patient denies this to me.  She noted that her abdomen was bloated.  She denies nausea or vomiting denies constipation or diarrhea.  She denies dyspnea and denies diaphoresis.  While waiting to his be seen, pain has subsided greatly.  She currently rates pain at 5/10.  Nothing seemed to make it better, nothing made it worse.  Past Medical History:  Diagnosis Date  . Anemia   . Anginal pain ([place])   . Anxiety   . Arthritis    "stiff fingers and knees" (08/04/2013), (12/12/2014)  . Asthma   . CAD (coronary artery disease) 2002; 2015   CABG x 6 2002, cath 2011- med Rx stent DES VG-Diag  . CAD (coronary artery disease) of artery bypass graft; DES to VG-Diag 09/28/13 11/09/2013  . Cataract   . CHF (congestive heart failure) ([place])    "in 2002" (11/26/2012)  . Chronic bronchitis ([place])    "q year; in the winter"   . CKD (chronic kidney disease)    stage 4, followed by [facility]  . Coronary artery disease 2002   CABG x 6. Cath 5/11- med Rx  . Diabetic neuropathy ([place])   . GERD (gastroesophageal reflux disease)   . Gout    "right big toe"  . Headache    "~ q week" (08/04/2013); "~ twice/month" (12/12/2014)  . History of blood transfusion 2002   "when I had OHS"  . Hyperlipidemia   . Hypertension   . Hypothyroid    treated  .  Migraines    "couple times/year" (08/04/2013), (12/12/2014)  . Myocardial infarction ([place]) 2000; 2002; 2011  . Obesity (BMI 35.0-39.9 without comorbidity)   . Peripheral vascular disease ([place]) 12/12   LSFA PTA  . Pneumonia    "3 times I think" (12/12/2014)  . Stroke [facility])    " mini stroke"  . Type II diabetes mellitus [facility])     Patient Active Problem List   Diagnosis Date Noted  . Foot ulcer, right ([place]) 11/28/2016  . Cervical polyp 11/12/2016  . AV (arteriovenous fistula) ([place]) 06/17/2016  . Mixed hyperlipidemia 05/21/2016  . ESRD (end stage renal disease) ([place])   . Hypokalemia 05/03/2016  . COPD exacerbation ([place]) 05/03/2016  . COPD (chronic obstructive pulmonary disease) ([place]) 04/11/2016  . CAP (community acquired pneumonia) 03/26/2016  . Acute respiratory failure with hypoxia ([place]) 03/26/2016  . Acute on chronic combined systolic and diastolic CHF (congestive heart failure) ([place]) 03/26/2016  . Medication management 03/21/2016  . Anemia of chronic disease 12/27/2015  . Acute on chronic systolic CHF (congestive heart failure) ([place])   . Acute CHF ([place]) 12/18/2015  . Hypertensive heart and renal disease with CHF and ESRD ([place]) 12/18/2015  . Constipation 12/18/2015  . Adjustment disorder with anxious mood 11/02/2015  . Posterior circulation stroke ([place]) 11/02/2015  . Abnormality of gait  10/23/2015  . Depression 09/28/2015  . Diabetic retinopathy ([place]) 09/13/2015  . Grief reaction 08/24/2015  . Creatinine elevation 07/25/2015  . Chronic combined systolic and diastolic congestive heart failure ([place]) 07/25/2015  . Rash of hands 02/23/2015  . Essential hypertension   . Angina pectoris ([place]) 12/12/2014  . Abnormal nuclear stress test   . Acute combined systolic and diastolic heart failure ([place]) 12/02/2014  . Seasonal allergies 08/11/2014  . Gout 08/11/2014  . Encounter for screening mammogram for breast cancer 05/16/2014  . Screening for colon cancer 05/16/2014  . Type 2  diabetes mellitus with ESRD (end-stage renal disease) ([place]) 01/27/2014  . Atopic eczema 01/27/2014  . Precordial pain, atypical, negative MI, Musculature Skeletal pain  11/08/2013  . CKD (chronic kidney disease) stage 5, GFR less than 15 ml/min (HCC) 11/08/2013  . Unstable angina ([place]) 09/28/2013  . Ischemic cardiomyopathy- new drop in EF 08/30/2013  . Chest pain 08/04/2013  . CAD -S/P PCI June 2015 and 12/14/14 02/01/2013  . Hypothyroidism, acquired 02/01/2013  . PVD, LSFA PTA 12/12 04/06/2011  . Hx of CABG x 6 2002 04/06/2011  . Dyslipidemia 04/06/2011    Past Surgical History:  Procedure Laterality Date  . ABDOMINAL AORTAGRAM N/A 04/05/2011   Procedure: ABDOMINAL AORTAGRAM;  Surgeon: Lorretta Harp, MD;  [facility] CATH LAB;  Service: Cardiovascular;  Laterality: N/A;  . ABDOMINAL AORTOGRAM N/A 10/30/2016   Procedure: Abdominal Aortogram;  Surgeon: Wellington Hampshire, MD;  Location: [place] CV LAB;  Service: Cardiovascular;  Laterality: N/A;  . APPENDECTOMY  1980  . AV FISTULA PLACEMENT Left 05/10/2016   Procedure: LEFT ARM ARTERIOVENOUS (AV) FISTULA CREATION;  Surgeon: Rosetta Posner, MD;  Location: [place];  Service: Vascular;  Laterality: Left;  . BASCILIC VEIN TRANSPOSITION Left 06/26/2016   Procedure: SECOND STAGE BASILIC VEIN TRANSPOSITION;  Surgeon: Rosetta Posner, MD;  Location: [place];  Service: Vascular;  Laterality: Left;  . BREAST CYST EXCISION Right 1970's  . CARDIAC CATHETERIZATION  2002  . CARDIAC CATHETERIZATION N/A 12/12/2014   Procedure: Left Heart Cath and Cors/Grafts Angiography;  Surgeon: Peter M Martinique, MD;  Location: [place] CV LAB;  Service: Cardiovascular;  Laterality: N/A;  . CARDIAC CATHETERIZATION N/A 12/12/2014   Procedure: Coronary Stent Intervention;  Surgeon: Peter M Martinique, MD;  Location: [place] CV LAB;  Service: Cardiovascular;  Laterality: N/A;  . CATARACT EXTRACTION, BILATERAL    . CESAREAN SECTION  1978; 1980  . CHOLECYSTECTOMY  1982  .  CORONARY ANGIOPLASTY WITH STENT PLACEMENT  2004; 2012   "I have 2 stents" (08/04/2013)  . CORONARY ANGIOPLASTY WITH STENT PLACEMENT  09/28/13   PTCA/ DES Xience stent to VG-Diag   . CORONARY ARTERY BYPASS GRAFT  11/20/2000   x6 LIMA to distal LAD, svg to first diag, svg to ramus intermediate branch and swquential SVG to cir marginal branch, SVG to posterior descending coronary and sequential SVG to first right posterolateral branch  . eye injections    . INSERTION OF DIALYSIS CATHETER Right 05/10/2016   Procedure: INSERTION OF DIALYSIS CATHETER - Right Internal Jugular Placement;  Surgeon: Rosetta Posner, MD;  Location: [place];  Service: Vascular;  Laterality: Right;  . LEFT HEART CATHETERIZATION WITH CORONARY/GRAFT ANGIOGRAM N/A 09/28/2013   Procedure: LEFT HEART CATHETERIZATION WITH Beatrix Fetters;  Surgeon: Peter M Martinique, MD;  Location: [facility] CATH LAB;  Service: Cardiovascular;  Laterality: N/A;  . LOWER EXTREMITY ANGIOGRAM  12/01/2012   Procedure: LOWER EXTREMITY ANGIOGRAM;  Surgeon: Pearletha Forge  Gwenlyn Found, MD;  Location: [facility] CATH LAB;  Service: Cardiovascular;;  . LOWER EXTREMITY ANGIOGRAPHY Right 10/30/2016   Procedure: Lower Extremity Angiography;  Surgeon: Wellington Hampshire, MD;  Location: [place] CV LAB;  Service: Cardiovascular;  Laterality: Right;  . NM MYOCAR PERF WALL MOTION  08/27/2004   negative  . PERCUTANEOUS STENT INTERVENTION Left 12/01/2012   Procedure: PERCUTANEOUS STENT INTERVENTION;  Surgeon: Lorretta Harp, MD;  Location: [facility] CATH LAB;  Service: Cardiovascular;  Laterality: Left;  Left SFA  . PERIPHERAL ARTERIAL STENT GRAFT Left    SFA/notes 04/07/2011 (11/30/2012)  . PERIPHERAL VASCULAR ATHERECTOMY Right 10/30/2016   Procedure: Peripheral Vascular Atherectomy;  Surgeon: Wellington Hampshire, MD;  Location: [place] CV LAB;  Service: Cardiovascular;  Laterality: Right;  cancel unable to  . PERIPHERAL VASCULAR INTERVENTION Right 10/30/2016   Procedure: Peripheral Vascular  Intervention;  Surgeon: Wellington Hampshire, MD;  Location: [place] CV LAB;  Service: Cardiovascular;  Laterality: Right;  SFA  . RENAL ANGIOGRAM N/A 04/05/2011   Procedure: RENAL ANGIOGRAM;  Surgeon: Lorretta Harp, MD;  Location: [facility] CATH LAB;  Service: Cardiovascular;  Laterality: N/A;  . TUBAL LIGATION  1980    OB History    No data available       Home Medications    Prior to Admission medications   Medication Sig Start Date End Date Taking? Authorizing Provider  acetaminophen-codeine (TYLENOL #3) 300-30 MG tablet Take 1 tablet by mouth 2 (two) times daily as needed for moderate pain.    [provider]  albuterol (PROVENTIL HFA;VENTOLIN HFA) 108 (90 Base) MCG/ACT inhaler Inhale 2 puffs every 4 (four) hours as needed into the lungs for wheezing or shortness of breath. 02/18/17   Janne Napoleon, NP  allopurinol (ZYLOPRIM) 300 MG tablet Take 1 tablet (300 mg total) daily by mouth. Patient not taking: Reported on 03/11/2017 02/25/17   Arnoldo Morale, MD  aspirin 81 MG chewable tablet Chew 1 tablet (81 mg total) by mouth daily. 02/24/15   Croitoru, Mihai, MD  atorvastatin (LIPITOR) 40 MG tablet Take 1 tablet (40 mg total) every evening by mouth. 02/25/17   Arnoldo Morale, MD  cadexomer iodine (IODOSORB) 0.9 % gel Apply 1 application topically daily as needed for wound care. Patient not taking: Reported on 03/11/2017 09/03/16   Gardiner Barefoot, DPM  clopidogrel (PLAVIX) 75 MG tablet Take 1 tablet (75 mg total) daily by mouth. 02/25/17   Arnoldo Morale, MD  fluticasone (FLONASE) 50 MCG/ACT nasal spray Place 2 sprays daily as needed into both nostrils for allergies or rhinitis. 02/25/17   Arnoldo Morale, MD  gabapentin (NEURONTIN) 400 MG capsule Take 400 mg by mouth at bedtime.     [provider]  insulin aspart (NOVOLOG) 100 UNIT/ML injection Inject 25-30 Units 3 (three) times daily before meals into the skin. Pens 02/24/17   Philemon Kingdom, MD  Insulin Glargine (BASAGLAR  KWIKPEN) 100 UNIT/ML SOPN INJECT 23 UNITS INTO THE SKIN AT BEDTIME 02/25/17   Arnoldo Morale, MD  isosorbide mononitrate (IMDUR) 60 MG 24 hr tablet Take 30 mg by mouth 2 (two) times daily.  07/08/16   [provider]  isosorbide mononitrate (IMDUR) 60 MG 24 hr tablet take 2 tablets by mouth once daily 03/26/17   Arnoldo Morale, MD  levothyroxine (SYNTHROID, LEVOTHROID) 25 MCG tablet take 1 tablet by mouth every morning before BREAKFAST 02/26/17   Arnoldo Morale, MD  lidocaine-prilocaine (EMLA) cream Apply 1 application topically as directed. 08/08/16   [provider]  loratadine (CLARITIN) 10 MG tablet Take 10 mg by mouth daily.    [provider]  metoprolol tartrate (LOPRESSOR) 25 MG tablet Take 0.5 tablets (12.5 mg total) by mouth 2 (two) times daily. 08/08/16   Croitoru, Mihai, MD  Naphazoline HCl (CLEAR EYES OP) Place 1 drop into both eyes as needed (for dry eyes).    [provider]  nitroGLYCERIN (NITROSTAT) 0.4 MG SL tablet Place 1 tablet (0.4 mg total) under the tongue every 5 (five) minutes as needed. Patient taking differently: Place 0.4 mg under the tongue every 5 (five) minutes as needed for chest pain.  04/11/16   Arnoldo Morale, MD  RENVELA 800 MG tablet Take 3 tablets (2,400 mg) by mouth three (3) times daily. 12/06/16   [provider]  senna-docusate (SENOKOT-S) 8.6-50 MG tablet Take 1 tablet by mouth at bedtime as needed for mild constipation. 12/23/15   Virgel Manifold, MD  sevelamer (RENAGEL) 800 MG tablet Take 2,400 mg by mouth 3 (three) times daily with meals.    [provider]  traMADol (ULTRAM) 50 MG tablet Take 1 tablet (50 mg total) by mouth every 12 (twelve) hours as needed. 11/12/16   Arnoldo Morale, MD  triamcinolone ointment (KENALOG) 0.5 % Apply 1 application topically daily. 06/26/16   Alvia Grove, PA-C    Family History Family History  Problem Relation Age of Onset  . Diabetes Mother   . Hypertension Mother   .  Stroke Mother   . Hypertension Father   . Hypertension Brother   . Hypertension Sister   . Diabetes Sister   . Hyperlipidemia Sister     Social History Social History   Tobacco Use  . Smoking status: Former Smoker    Packs/day: 0.00    Years: 25.00    Pack years: 0.00    Types: Cigarettes    Last attempt to quit: 04/04/2000    Years since quitting: 16.9  . Smokeless tobacco: Never Used  Substance Use Topics  . Alcohol use: No    Comment: 12/12/2014  "have a glass of red wine on my birthday q yr; that's it"  . Drug use: No     Allergies   Digoxin and related; Hydralazine; Penicillins cross reactors; Lisinopril; and Adhesive [tape]   Review of Systems Review of Systems  All other systems reviewed and are negative.    Physical Exam Updated Vital Signs BP (!) 124/48   Pulse 73   Temp 99.1 F (37.3 C) (Oral)   Resp 10   Ht 5\' 10"  (1.778 m)   Wt 100.7 kg (222 lb)   LMP 10/13/2014 (Exact Date)   SpO2 96%   BMI 31.85 kg/m   Physical Exam  Nursing note and vitals reviewed.  61 year old female, resting comfortably and in no acute distress. Vital signs are normal. Oxygen saturation is 96%, which is normal. Head is normocephalic and atraumatic. PERRLA, EOMI. Oropharynx is clear. Neck is nontender and supple without adenopathy or JVD. Back is nontender and there is no CVA tenderness. Lungs are clear without rales, wheezes, or rhonchi. Chest is nontender. Heart has regular rate and rhythm without murmur. Abdomen is soft, flat, nontender without masses or hepatosplenomegaly and peristalsis is normoactive. Extremities have no cyanosis or edema, full range of motion is present. Skin is warm and dry without rash. Neurologic: Mental status is normal, cranial nerves are intact, there are no motor or sensory deficits.  ED Treatments / Results  Labs (all labs ordered are  listed, but only abnormal results are displayed) Labs Reviewed  BASIC METABOLIC PANEL - Abnormal;  Notable for the following components:      Result Value   Potassium 3.3 (*)    Chloride 92 (*)    Glucose, Bld 179 (*)    Creatinine, Ser 6.28 (*)    Calcium 8.7 (*)    GFR calc non Af Amer 6 (*)    GFR calc Af Amer 7 (*)    All other components within normal limits  CBC - Abnormal; Notable for the following components:   RBC 3.64 (*)    MCV 102.5 (*)    All other components within normal limits  I-STAT TROPONIN, ED    EKG  EKG Interpretation  Date/Time:  Wednesday March 26 2017 22:12:53 EST Ventricular Rate:  78 PR Interval:  176 QRS Duration: 102 QT Interval:  206 QTC Calculation: 234 R Axis:   21 Text Interpretation:  Sinus rhythm with frequent Premature ventricular complexes Possible Left atrial enlargement Nonspecific ST and T wave abnormality Abnormal ECG When compared with ECG of 11/29/2016, No significant change was found Confirmed by Delora Fuel ([id number]) on 03/26/2017 11:14:17 PM       Radiology Dg Chest 2 View  Result Date: 03/26/2017 CLINICAL DATA:  Chest pain x5 hours. EXAM: CHEST  2 VIEW COMPARISON:  None. FINDINGS: The patient is status post CABG. Heart size is top-normal. There is aortic atherosclerosis at the arch. Bibasilar atelectasis and/or scarring is noted. No alveolar consolidation, CHF, effusion or pneumothorax. The visualized skeletal structures are unremarkable. IMPRESSION: No active cardiopulmonary disease. Electronically Signed   By: Ashley Royalty M.D.   On: 03/26/2017 23:00    Procedures Procedures (including critical care time)  Medications Ordered in ED Medications  pantoprazole (PROTONIX) EC tablet 40 mg (not administered)  gi cocktail (Maalox,Lidocaine,Donnatal) (30 mLs Oral Given 03/27/17 0335)     Initial Impression / Assessment and Plan / ED Course  I have reviewed the triage vital signs and the nursing notes.  Pertinent labs & imaging results that were available during my care of the patient were reviewed by me and considered in  my medical decision making (see chart for details).  Abdominal pain which has mostly resolved.  Mild tenderness in the epigastric area-question exacerbation of GERD.  No red flags to suggest more serious problems such as pancreatitis, diverticulitis, vascular insufficiency.  Old records are reviewed, and last ED visit for abdominal pain was in March 2014.  She will be given a therapeutic trial of a GI cocktail.  She had good relief of symptoms with above-noted treatment, but stated she felt like she could not breathe.  This was felt to be related to the anesthetic effect of the GI cocktail.  She was observed for 10 minutes, and symptoms resolved.  She is discharged with prescription for pantoprazole.  Follow-up with PCP.  Return precautions discussed.  Final Clinical Impressions(s) / ED Diagnoses   Final diagnoses:  Epigastric pain    ED Discharge Orders        Ordered    pantoprazole (PROTONIX) 40 MG tablet  Daily     62/[id number]/76 [id number]       Delora Fuel, MD 03/54/65 [phone]

## 2017-03-28 DIAGNOSIS — N2581 Secondary hyperparathyroidism of renal origin: Secondary | ICD-10-CM | POA: Diagnosis not present

## 2017-03-28 DIAGNOSIS — D509 Iron deficiency anemia, unspecified: Secondary | ICD-10-CM | POA: Diagnosis not present

## 2017-03-28 DIAGNOSIS — E1129 Type 2 diabetes mellitus with other diabetic kidney complication: Secondary | ICD-10-CM | POA: Diagnosis not present

## 2017-03-28 DIAGNOSIS — N186 End stage renal disease: Secondary | ICD-10-CM | POA: Diagnosis not present

## 2017-03-31 DIAGNOSIS — N186 End stage renal disease: Secondary | ICD-10-CM | POA: Diagnosis not present

## 2017-03-31 DIAGNOSIS — D509 Iron deficiency anemia, unspecified: Secondary | ICD-10-CM | POA: Diagnosis not present

## 2017-03-31 DIAGNOSIS — N2581 Secondary hyperparathyroidism of renal origin: Secondary | ICD-10-CM | POA: Diagnosis not present

## 2017-03-31 DIAGNOSIS — E1129 Type 2 diabetes mellitus with other diabetic kidney complication: Secondary | ICD-10-CM | POA: Diagnosis not present

## 2017-04-01 ENCOUNTER — Encounter: Payer: Self-pay | Admitting: Sports Medicine

## 2017-04-01 ENCOUNTER — Ambulatory Visit (INDEPENDENT_AMBULATORY_CARE_PROVIDER_SITE_OTHER): Payer: Medicare Other | Admitting: Sports Medicine

## 2017-04-01 DIAGNOSIS — I739 Peripheral vascular disease, unspecified: Secondary | ICD-10-CM

## 2017-04-01 DIAGNOSIS — E11621 Type 2 diabetes mellitus with foot ulcer: Secondary | ICD-10-CM

## 2017-04-01 DIAGNOSIS — B351 Tinea unguium: Secondary | ICD-10-CM

## 2017-04-01 DIAGNOSIS — E1142 Type 2 diabetes mellitus with diabetic polyneuropathy: Secondary | ICD-10-CM

## 2017-04-01 DIAGNOSIS — M79674 Pain in right toe(s): Secondary | ICD-10-CM

## 2017-04-01 DIAGNOSIS — M79675 Pain in left toe(s): Secondary | ICD-10-CM | POA: Diagnosis not present

## 2017-04-01 DIAGNOSIS — L97519 Non-pressure chronic ulcer of other part of right foot with unspecified severity: Secondary | ICD-10-CM

## 2017-04-01 NOTE — Progress Notes (Signed)
Subjective: Brooke Stafford is a 61 y.o. female patient seen in office for follow up evaluation of ulceration of the right great toe. Patient is doing well after vascular intervention with Dr. Fletcher Anon. States a few weeks ago had callus on right 1st toe that peeled off with some soreness, sharp pain every now and again sometimes at worse 5/10. Had nurse at dialysis to check toe who advised vasaline to callus. Patient is also here for diabetic foot care and desires to have her nails trimmed today as well. Patient has no other pedal complaints at this time.  FBS this AM 131, last A1c 6.2  Patient is also a dialysis patient and is on Plavix.   Patient Active Problem List   Diagnosis Date Noted  . Foot ulcer, right ([place]) 11/28/2016  . Cervical polyp 11/12/2016  . AV (arteriovenous fistula) ([place]) 06/17/2016  . Mixed hyperlipidemia 05/21/2016  . ESRD (end stage renal disease) ([place])   . Hypokalemia 05/03/2016  . COPD exacerbation ([place]) 05/03/2016  . COPD (chronic obstructive pulmonary disease) ([place]) 04/11/2016  . CAP (community acquired pneumonia) 03/26/2016  . Acute respiratory failure with hypoxia ([place]) 03/26/2016  . Acute on chronic combined systolic and diastolic CHF (congestive heart failure) ([place]) 03/26/2016  . Medication management 03/21/2016  . Anemia of chronic disease 12/27/2015  . Acute on chronic systolic CHF (congestive heart failure) ([place])   . Acute CHF ([place]) 12/18/2015  . Hypertensive heart and renal disease with CHF and ESRD ([place]) 12/18/2015  . Constipation 12/18/2015  . Adjustment disorder with anxious mood 11/02/2015  . Posterior circulation stroke ([place]) 11/02/2015  . Abnormality of gait 10/23/2015  . Depression 09/28/2015  . Diabetic retinopathy ([place]) 09/13/2015  . Grief reaction 08/24/2015  . Creatinine elevation 07/25/2015  . Chronic combined systolic and diastolic congestive heart failure ([place]) 07/25/2015  . Rash of hands 02/23/2015  . Essential hypertension   .  Angina pectoris ([place]) 12/12/2014  . Abnormal nuclear stress test   . Acute combined systolic and diastolic heart failure ([place]) 12/02/2014  . Seasonal allergies 08/11/2014  . Gout 08/11/2014  . Encounter for screening mammogram for breast cancer 05/16/2014  . Screening for colon cancer 05/16/2014  . Type 2 diabetes mellitus with ESRD (end-stage renal disease) ([place]) 01/27/2014  . Atopic eczema 01/27/2014  . Precordial pain, atypical, negative MI, Musculature Skeletal pain  11/08/2013  . CKD (chronic kidney disease) stage 5, GFR less than 15 ml/min (HCC) 11/08/2013  . Unstable angina ([place]) 09/28/2013  . Ischemic cardiomyopathy- new drop in EF 08/30/2013  . Chest pain 08/04/2013  . CAD -S/P PCI June 2015 and 12/14/14 02/01/2013  . Hypothyroidism, acquired 02/01/2013  . PVD, LSFA PTA 12/12 04/06/2011  . Hx of CABG x 6 2002 04/06/2011  . Dyslipidemia 04/06/2011   Current Outpatient Medications on File Prior to Visit  Medication Sig Dispense Refill  . allopurinol (ZYLOPRIM) 300 MG tablet Take 1 tablet (300 mg total) daily by mouth. (Patient not taking: Reported on 03/11/2017) 90 tablet 1  . aspirin 81 MG chewable tablet Chew 1 tablet (81 mg total) by mouth daily. 30 tablet 10  . atorvastatin (LIPITOR) 40 MG tablet Take 1 tablet (40 mg total) every evening by mouth. 90 tablet 1  . cetirizine (ZYRTEC) 10 MG tablet Take 10 mg by mouth daily.    . clopidogrel (PLAVIX) 75 MG tablet Take 1 tablet (75 mg total) daily by mouth. 90 tablet 1  . fluticasone (FLONASE) 50 MCG/ACT nasal spray Place 2 sprays daily  as needed into both nostrils for allergies or rhinitis. 16 g 3  . gabapentin (NEURONTIN) 400 MG capsule Take 400 mg by mouth at bedtime.     . insulin aspart (NOVOLOG FLEXPEN) 100 UNIT/ML FlexPen Inject 25 units before breakfast and 20 units before lunch and dinner 70 pen 0  . Insulin Glargine (BASAGLAR KWIKPEN) 100 UNIT/ML SOPN INJECT 23 UNITS INTO THE SKIN AT BEDTIME 45 mL 3  . isosorbide  mononitrate (IMDUR) 60 MG 24 hr tablet take 2 tablets by mouth once daily 180 tablet 1  . levothyroxine (SYNTHROID, LEVOTHROID) 25 MCG tablet take 1 tablet by mouth every morning before BREAKFAST 30 tablet 2  . metoprolol tartrate (LOPRESSOR) 25 MG tablet Take 0.5 tablets (12.5 mg total) by mouth 2 (two) times daily. 90 tablet 3  . nitroGLYCERIN (NITROSTAT) 0.4 MG SL tablet Place 1 tablet (0.4 mg total) under the tongue every 5 (five) minutes as needed. (Patient taking differently: Place 0.4 mg under the tongue every 5 (five) minutes as needed for chest pain. ) 30 tablet 2  . pantoprazole (PROTONIX) 40 MG tablet Take 1 tablet (40 mg total) by mouth daily. 30 tablet 0  . sevelamer (RENAGEL) 800 MG tablet Take 2,400 mg by mouth 3 (three) times daily with meals.    . traMADol (ULTRAM) 50 MG tablet Take 1 tablet (50 mg total) by mouth every 12 (twelve) hours as needed. 40 tablet 1   No current facility-administered medications on file prior to visit.    Allergies  Allergen Reactions  . Digoxin And Related Diarrhea and Other (See Comments)    TOXIC DRUG LEVELS Patient stated she almost died. Had flu like symptoms as well as diarrhea.  . Hydralazine Shortness Of Breath  . Penicillins Cross Reactors Hives and Other (See Comments)    HIGH FEVER  Has patient had a PCN reaction causing immediate rash, facial/tongue/throat swelling, SOB or lightheadedness with hypotension: No Has patient had a PCN reaction causing SEVERE RASH INVOLVING MUCUS MEMBRANES or SKIN NECROSIS  #  #  #  YES  #  #  #  Has patient had a PCN reaction that required hospitalization: Unk Has patient had a PCN reaction occurring within the last 10 years: Unk If all of the above answers are "NO", then may proceed with Cephalosporin use.   [name] Lisinopril Other (See Comments)    Felt like she had the flu. Was very sick!!!  . Adhesive [Tape] Rash and Other (See Comments)    bruising   Objective: There were no vitals filed for this  visit.   General: Patient is awake, alert, oriented x 3 and in no acute distress.  Dermatology: Skin is warm and dry bilateral with a continued healed ulceration  at right great toe. Ulceration remains resolved. Minimal reactive callus to toe. No other acute signs of infection. Nails are elongated and mycotic.    Vascular: Dorsalis Pedis pulse = 1/4 Bilateral,  Posterior Tibial pulse = 0/4 Bilateral,  Capillary Fill Time < 5 seconds  Neurologic: Protective sensation absent bilateral.  Musculosketal:  No pain with palpation to previous ulcerated area. No pain with compression to calves bilateral. Asymptomatic hammertoe bony deformities noted bilateral. Recent Labs    05/04/16 1014  GRAMSTAIN MODERATE WBC PRESENT,BOTH PMN AND MONONUCLEAR NO ORGANISMS SEEN     Assessment and Plan:  Problem List Items Addressed This Visit    None    Visit Diagnoses    Pain due to onychomycosis of toenails of both  feet    -  Primary   Ulcer of right great toe due to diabetes mellitus (HCC)       Healed   Diabetic polyneuropathy associated with type 2 diabetes mellitus ([place])       PAD (peripheral artery disease) ([place])         -Examined patient and discussed the progression of the healed wound and treatment alternatives. - Previous ulcer remains healed at right 1st toe. Advised silicone toe cap -Advised patient to continue with shoes that fit appropriately to prevent rubbing to toes/re-ulceration  -Nails x 10 debrided using sterile nail nipper without incident -Patient to return in 10 weeks for routine foot care or sooner if problems arise.  Landis Martins, DPM

## 2017-04-02 DIAGNOSIS — N2581 Secondary hyperparathyroidism of renal origin: Secondary | ICD-10-CM | POA: Diagnosis not present

## 2017-04-02 DIAGNOSIS — E1129 Type 2 diabetes mellitus with other diabetic kidney complication: Secondary | ICD-10-CM | POA: Diagnosis not present

## 2017-04-02 DIAGNOSIS — D509 Iron deficiency anemia, unspecified: Secondary | ICD-10-CM | POA: Diagnosis not present

## 2017-04-02 DIAGNOSIS — N186 End stage renal disease: Secondary | ICD-10-CM | POA: Diagnosis not present

## 2017-04-03 ENCOUNTER — Encounter: Payer: Self-pay | Admitting: Internal Medicine

## 2017-04-03 ENCOUNTER — Ambulatory Visit: Payer: Medicare Other | Admitting: Licensed Clinical Social Worker

## 2017-04-03 ENCOUNTER — Ambulatory Visit: Payer: Medicare Other | Attending: Internal Medicine | Admitting: Internal Medicine

## 2017-04-03 VITALS — BP 114/69 | HR 93 | Temp 98.2°F | Resp 16 | Wt 221.2 lb

## 2017-04-03 DIAGNOSIS — F4322 Adjustment disorder with anxiety: Secondary | ICD-10-CM | POA: Insufficient documentation

## 2017-04-03 DIAGNOSIS — J449 Chronic obstructive pulmonary disease, unspecified: Secondary | ICD-10-CM | POA: Diagnosis not present

## 2017-04-03 DIAGNOSIS — Z79899 Other long term (current) drug therapy: Secondary | ICD-10-CM | POA: Diagnosis not present

## 2017-04-03 DIAGNOSIS — E782 Mixed hyperlipidemia: Secondary | ICD-10-CM | POA: Insufficient documentation

## 2017-04-03 DIAGNOSIS — E11319 Type 2 diabetes mellitus with unspecified diabetic retinopathy without macular edema: Secondary | ICD-10-CM | POA: Insufficient documentation

## 2017-04-03 DIAGNOSIS — Z880 Allergy status to penicillin: Secondary | ICD-10-CM | POA: Insufficient documentation

## 2017-04-03 DIAGNOSIS — F329 Major depressive disorder, single episode, unspecified: Secondary | ICD-10-CM | POA: Diagnosis not present

## 2017-04-03 DIAGNOSIS — I255 Ischemic cardiomyopathy: Secondary | ICD-10-CM | POA: Diagnosis not present

## 2017-04-03 DIAGNOSIS — M109 Gout, unspecified: Secondary | ICD-10-CM | POA: Diagnosis not present

## 2017-04-03 DIAGNOSIS — Z951 Presence of aortocoronary bypass graft: Secondary | ICD-10-CM | POA: Insufficient documentation

## 2017-04-03 DIAGNOSIS — F419 Anxiety disorder, unspecified: Secondary | ICD-10-CM

## 2017-04-03 DIAGNOSIS — E1151 Type 2 diabetes mellitus with diabetic peripheral angiopathy without gangrene: Secondary | ICD-10-CM | POA: Diagnosis not present

## 2017-04-03 DIAGNOSIS — E1122 Type 2 diabetes mellitus with diabetic chronic kidney disease: Secondary | ICD-10-CM | POA: Diagnosis not present

## 2017-04-03 DIAGNOSIS — Z794 Long term (current) use of insulin: Secondary | ICD-10-CM | POA: Diagnosis not present

## 2017-04-03 DIAGNOSIS — I132 Hypertensive heart and chronic kidney disease with heart failure and with stage 5 chronic kidney disease, or end stage renal disease: Secondary | ICD-10-CM | POA: Insufficient documentation

## 2017-04-03 DIAGNOSIS — Z7902 Long term (current) use of antithrombotics/antiplatelets: Secondary | ICD-10-CM | POA: Insufficient documentation

## 2017-04-03 DIAGNOSIS — E11621 Type 2 diabetes mellitus with foot ulcer: Secondary | ICD-10-CM | POA: Diagnosis not present

## 2017-04-03 DIAGNOSIS — N186 End stage renal disease: Secondary | ICD-10-CM | POA: Diagnosis not present

## 2017-04-03 DIAGNOSIS — Z79890 Hormone replacement therapy (postmenopausal): Secondary | ICD-10-CM | POA: Diagnosis not present

## 2017-04-03 DIAGNOSIS — L97519 Non-pressure chronic ulcer of other part of right foot with unspecified severity: Secondary | ICD-10-CM | POA: Diagnosis not present

## 2017-04-03 DIAGNOSIS — I25110 Atherosclerotic heart disease of native coronary artery with unstable angina pectoris: Secondary | ICD-10-CM | POA: Insufficient documentation

## 2017-04-03 DIAGNOSIS — D631 Anemia in chronic kidney disease: Secondary | ICD-10-CM | POA: Insufficient documentation

## 2017-04-03 DIAGNOSIS — Z7982 Long term (current) use of aspirin: Secondary | ICD-10-CM | POA: Diagnosis not present

## 2017-04-03 DIAGNOSIS — F4321 Adjustment disorder with depressed mood: Secondary | ICD-10-CM

## 2017-04-03 DIAGNOSIS — E039 Hypothyroidism, unspecified: Secondary | ICD-10-CM | POA: Diagnosis not present

## 2017-04-03 DIAGNOSIS — Z992 Dependence on renal dialysis: Secondary | ICD-10-CM | POA: Insufficient documentation

## 2017-04-03 LAB — GLUCOSE, POCT (MANUAL RESULT ENTRY): POC GLUCOSE: 196 mg/dL — AB (ref 70–99)

## 2017-04-03 MED ORDER — HYDROXYZINE HCL 25 MG PO TABS
12.5000 mg | ORAL_TABLET | Freq: Every day | ORAL | 0 refills | Status: DC | PRN
Start: 2017-04-03 — End: 2017-10-21

## 2017-04-03 MED ORDER — FLUOXETINE HCL 10 MG PO CAPS: 10.0000 mg | ORAL_CAPSULE | Freq: Every day | ORAL | 1 refills | Status: DC

## 2017-04-03 NOTE — Patient Instructions (Signed)
Start Prozac 10 mg daily. It takes about 4-6 weeks before you feel better.  Let me know if you have any increase anxiety or depression on the medication. Take Hydroxyzine as needed for acute anxiety attacks.  Keep your appointment with Dr. Jarold Song next month.

## 2017-04-03 NOTE — Progress Notes (Signed)
Patient ID: Brooke Stafford, female    DOB: Aug 06, 1955  MRN: [id number]  CC:  ANXIETY Subjective:  Brooke Stafford is a 61 y.o. female who presents for UC visit.  PCP is Dr. Jarold Song Her concerns today include:  history of type 2 diabetes mellitus (A1c 6.0), hypertension, CHF (2-D echo 35 to 40% from 2-D echo of 04/2016), CAD status post PCI ,end-stage renal disease on HD, peripheral vascular disease   Pt c/o anxiety feelings x few mths. Admits to feeling down. No crying spells. Poor energy level. Not sleeping well. Appetite okay. Anxiety attacks present as feeling restless and "locked up.".  Occur about 4 x a wk and last 30 mins.  She goes outside when she has attacks "to try to catch some air and calm down." -initially started after death of her son 1 yr ago; son died from [place].  Got worse few mths ago because son's birthday is coming up 04/11/2017. Denies SI. -she was on Prozac 1 yr ago for about 6 mths for anxiety and did well on it. She was also seeing a counselor through hospice.    Patient Active Problem List   Diagnosis Date Noted  . Foot ulcer, right ([place]) 11/28/2016  . Cervical polyp 11/12/2016  . AV (arteriovenous fistula) ([place]) 06/17/2016  . Mixed hyperlipidemia 05/21/2016  . ESRD (end stage renal disease) ([place])   . Hypokalemia 05/03/2016  . COPD exacerbation ([place]) 05/03/2016  . COPD (chronic obstructive pulmonary disease) ([place]) 04/11/2016  . CAP (community acquired pneumonia) 03/26/2016  . Acute respiratory failure with hypoxia ([place]) 03/26/2016  . Acute on chronic combined systolic and diastolic CHF (congestive heart failure) ([place]) 03/26/2016  . Medication management 03/21/2016  . Anemia of chronic disease 12/27/2015  . Acute on chronic systolic CHF (congestive heart failure) ([place])   . Acute CHF ([place]) 12/18/2015  . Hypertensive heart and renal disease with CHF and ESRD ([place]) 12/18/2015  . Constipation 12/18/2015  . Adjustment disorder with anxious mood 11/02/2015  .  Posterior circulation stroke ([place]) 11/02/2015  . Abnormality of gait 10/23/2015  . Depression 09/28/2015  . Diabetic retinopathy ([place]) 09/13/2015  . Grief reaction 08/24/2015  . Creatinine elevation 07/25/2015  . Chronic combined systolic and diastolic congestive heart failure ([place]) 07/25/2015  . Rash of hands 02/23/2015  . Essential hypertension   . Angina pectoris ([place]) 12/12/2014  . Abnormal nuclear stress test   . Acute combined systolic and diastolic heart failure ([place]) 12/02/2014  . Seasonal allergies 08/11/2014  . Gout 08/11/2014  . Encounter for screening mammogram for breast cancer 05/16/2014  . Screening for colon cancer 05/16/2014  . Type 2 diabetes mellitus with ESRD (end-stage renal disease) ([place]) 01/27/2014  . Atopic eczema 01/27/2014  . Precordial pain, atypical, negative MI, Musculature Skeletal pain  11/08/2013  . CKD (chronic kidney disease) stage 5, GFR less than 15 ml/min (HCC) 11/08/2013  . Unstable angina ([place]) 09/28/2013  . Ischemic cardiomyopathy- new drop in EF 08/30/2013  . Chest pain 08/04/2013  . CAD -S/P PCI June 2015 and 12/14/14 02/01/2013  . Hypothyroidism, acquired 02/01/2013  . PVD, LSFA PTA 12/12 04/06/2011  . Hx of CABG x 6 2002 04/06/2011  . Dyslipidemia 04/06/2011     Current Outpatient Medications on File Prior to Visit  Medication Sig Dispense Refill  . allopurinol (ZYLOPRIM) 300 MG tablet Take 1 tablet (300 mg total) daily by mouth. (Patient not taking: Reported on 03/11/2017) 90 tablet 1  . aspirin 81 MG chewable tablet Chew 1 tablet (  81 mg total) by mouth daily. 30 tablet 10  . atorvastatin (LIPITOR) 40 MG tablet Take 1 tablet (40 mg total) every evening by mouth. 90 tablet 1  . cetirizine (ZYRTEC) 10 MG tablet Take 10 mg by mouth daily.    . clopidogrel (PLAVIX) 75 MG tablet Take 1 tablet (75 mg total) daily by mouth. 90 tablet 1  . fluticasone (FLONASE) 50 MCG/ACT nasal spray Place 2 sprays daily as needed into both nostrils for  allergies or rhinitis. 16 g 3  . gabapentin (NEURONTIN) 400 MG capsule Take 400 mg by mouth at bedtime.     . insulin aspart (NOVOLOG FLEXPEN) 100 UNIT/ML FlexPen Inject 25 units before breakfast and 20 units before lunch and dinner 70 pen 0  . Insulin Glargine (BASAGLAR KWIKPEN) 100 UNIT/ML SOPN INJECT 23 UNITS INTO THE SKIN AT BEDTIME 45 mL 3  . isosorbide mononitrate (IMDUR) 60 MG 24 hr tablet take 2 tablets by mouth once daily 180 tablet 1  . levothyroxine (SYNTHROID, LEVOTHROID) 25 MCG tablet take 1 tablet by mouth every morning before BREAKFAST 30 tablet 2  . metoprolol tartrate (LOPRESSOR) 25 MG tablet Take 0.5 tablets (12.5 mg total) by mouth 2 (two) times daily. 90 tablet 3  . nitroGLYCERIN (NITROSTAT) 0.4 MG SL tablet Place 1 tablet (0.4 mg total) under the tongue every 5 (five) minutes as needed. (Patient taking differently: Place 0.4 mg under the tongue every 5 (five) minutes as needed for chest pain. ) 30 tablet 2  . pantoprazole (PROTONIX) 40 MG tablet Take 1 tablet (40 mg total) by mouth daily. 30 tablet 0  . sevelamer (RENAGEL) 800 MG tablet Take 2,400 mg by mouth 3 (three) times daily with meals.    . traMADol (ULTRAM) 50 MG tablet Take 1 tablet (50 mg total) by mouth every 12 (twelve) hours as needed. 40 tablet 1   No current facility-administered medications on file prior to visit.     Allergies  Allergen Reactions  . Digoxin And Related Diarrhea and Other (See Comments)    TOXIC DRUG LEVELS Patient stated she almost died. Had flu like symptoms as well as diarrhea.  . Hydralazine Shortness Of Breath  . Penicillins Cross Reactors Hives and Other (See Comments)    HIGH FEVER  Has patient had a PCN reaction causing immediate rash, facial/tongue/throat swelling, SOB or lightheadedness with hypotension: No Has patient had a PCN reaction causing SEVERE RASH INVOLVING MUCUS MEMBRANES or SKIN NECROSIS  #  #  #  YES  #  #  #  Has patient had a PCN reaction that required  hospitalization: Unk Has patient had a PCN reaction occurring within the last 10 years: Unk If all of the above answers are "NO", then may proceed with Cephalosporin use.   [name] Lisinopril Other (See Comments)    Felt like she had the flu. Was very sick!!!  . Adhesive [Tape] Rash and Other (See Comments)    bruising     ROS: Review of Systems Neg except as above  PHYSICAL EXAM: BP 114/69   Pulse 93   Temp 98.2 F (36.8 C) (Oral)   Resp 16   Wt 221 lb 3.2 oz (100.3 kg)   LMP 10/13/2014 (Exact Date)   SpO2 98%   BMI 31.74 kg/m   Wt Readings from Last 3 Encounters:  04/03/17 221 lb 3.2 oz (100.3 kg)  03/26/17 222 lb (100.7 kg)  03/11/17 226 lb (102.5 kg)    Physical Exam General appearance -  alert, well appearing, and in no distress Mental status - alert, oriented to person, place, and time, normal mood, behavior, speech, dress, motor activity, and thought processes Chest - clear to auscultation, no wheezes, rales or rhonchi, symmetric air entry Heart - normal rate, regular rhythm, normal S1, S2,   Depression screen [facility] 2/9 04/03/2017 02/25/2017 11/12/2016 08/27/2016 08/13/2016  Decreased Interest 2 1 1 1 2   Down, Depressed, Hopeless 2 1 1 1 2   PHQ - 2 Score 4 2 2 2 4   Altered sleeping 2 1 1 1 2   Tired, decreased energy 2 1 1 1 2   Change in appetite 2 1 1 1 2   Feeling bad or failure about yourself  2 1 1 1 2   Trouble concentrating 2 1 1 1 2   Moving slowly or fidgety/restless 2 1 1 1 2   Suicidal thoughts (No Data) 1 0 3 0  PHQ-9 Score 16 9 8 11 16   Difficult doing work/chores - - - - Somewhat difficult  Some recent data might be hidden   GAD 7 : Generalized Anxiety Score 04/03/2017 02/25/2017 11/12/2016 08/27/2016  Nervous, Anxious, on Edge 3 1 1 1   Control/stop worrying 2 1 1 3   Worry too much - different things 2 1 1  0  Trouble relaxing 2 1 1 1   Restless 2 1 1 2   Easily annoyed or irritable 2 1 1 1   Afraid - awful might happen 2 1 1  0  Total GAD 7 Score 15 7 7 8     Anxiety Difficulty - - - -   Results for orders placed or performed in visit on 04/03/17  POCT glucose (manual entry)  Result Value Ref Range   POC Glucose 196 (A) 70 - 99 mg/dl    ASSESSMENT AND PLAN: 1. Anxiety and depression Discuss putting her back on Prozac and getting her in with a counselor. Will have LCSW meet with her to discuss therapy options in the area. Pt advised that if she has any inc dep/anxiety on Prozac to call. Pt told it can take 4-6 wks before she starts feeling better on the med. Pt to keep appt with her PCP next mth Hydroxyzine to use PRN for acute attacks. Advise that med can cause drowsiness.  - FLUoxetine (PROZAC) 10 MG capsule; Take 1 capsule (10 mg total) by mouth daily.  Dispense: 30 capsule; Refill: 1  2. Type 2 diabetes mellitus with ESRD (end-stage renal disease) ([place]) Not addressed today - POCT glucose (manual entry)  Patient was given the opportunity to ask questions.  Patient verbalized understanding of the plan and was able to repeat key elements of the plan.   Orders Placed This Encounter  Procedures  . POCT glucose (manual entry)     Requested Prescriptions    No prescriptions requested or ordered in this encounter    Future Appointments  Date Time Provider [place]  04/17/2017  9:30 AM Arnoldo Morale, MD [facility] None  05/27/2017 12:30 PM [facility] VASC 1 [facility]  05/27/2017  2:30 PM [facility]ASC 1 [facility]  06/03/2017  1:45 PM Arnoldo Morale, MD [facility] None  06/10/2017 11:15 AM Landis Martins, DPM [facility]  06/12/2017  2:15 PM Philemon Kingdom, MD [facility] None    Karle Plumber, MD, FACP

## 2017-04-04 DIAGNOSIS — E1129 Type 2 diabetes mellitus with other diabetic kidney complication: Secondary | ICD-10-CM | POA: Diagnosis not present

## 2017-04-04 DIAGNOSIS — D509 Iron deficiency anemia, unspecified: Secondary | ICD-10-CM | POA: Diagnosis not present

## 2017-04-04 DIAGNOSIS — N186 End stage renal disease: Secondary | ICD-10-CM | POA: Diagnosis not present

## 2017-04-04 DIAGNOSIS — N2581 Secondary hyperparathyroidism of renal origin: Secondary | ICD-10-CM | POA: Diagnosis not present

## 2017-04-04 NOTE — BH Specialist Note (Signed)
Integrated Behavioral Health Follow Up Visit  MRN: [id number] Name: Brooke Stafford  Number of [place] Clinician visits: 3/6 Session Start time: 2:20 PM  Session End time: 2:50 PM Total time: 30 minutes  Type of Service: [place] Interpretor:No. Interpretor Name and Language: N/A  SUBJECTIVE: Brooke Stafford is a 61 y.o. female accompanied by self Patient was referred by Dr. Wynetta Emery for depression and anxiety. Patient reports the following symptoms/concerns: overwhelming feelings of sadness and worry, low energy, difficulty sleeping, restlessness, decreased concentration, and panic attacks Duration of problem: Few months; Severity of problem: moderate  OBJECTIVE: Mood: Depressed and Affect: Appropriate Risk of harm to self or others: No plan to harm self or others  LIFE CONTEXT: Family and Social: Pt reports strong support system, including friends, family, and church members School/Work: Pt receives disability ($960) and food stamps ($85). She has active Medicaid and Medicare Self-Care: Pt utilizes healthy coping skills and participates in medication management.  Life Changes: Pt is grieving the loss of her adult son who passed away Aug 19, 2015. Pt is having a difficulty time coping with the holiday season, his upcoming bday, and the anniversary of his passing. She is currently going through dialysis  GOALS ADDRESSED: Patient will: 1.  Reduce symptoms of: anxiety and depression  2.  Increase knowledge and/or ability of: coping skills  3.  Demonstrate ability to: Increase adequate support systems for patient/family  INTERVENTIONS: Interventions utilized:  Solution-Focused Strategies, Supportive Counseling and Psychoeducation and/or Health Education Standardized Assessments completed: GAD-7 and PHQ 2&9  ASSESSMENT: Patient currently experiencing depression and anxiety triggered by chronic medical condition and the loss  of pt's adult son. She reports overwhelming feelings of sadness and worry, low energy, difficulty sleeping, restlessness, decreased concentration, and panic attacks. Pt is receiving strong support from family and friends.   Patient may benefit from re-initiating psychotherapy and medication management. LCSWA validated pt's feelings and assisted pt in processing her feelings related to grief. Pt is open to contacting [facility] with Hospice to re-initiate services. She is participating in medication management through PCP.    PLAN: 1. Follow up with behavioral health clinician on : Pt was encouraged tocontact LCSWA if symptoms worsen or fail to improveto schedule behavioral appointments at [facility]. 2. Behavioral recommendations: LCSWA recommends that pt apply healthy coping skills discussed, comply with medication management, and re-initiate services with Hospice. Pt is encouraged to schedule follow up appointment with LCSWA 3. Referral(s): Grief Support Services through Hospice  4. "From scale of 1-10, how likely are you to follow plan?": 10/10  Rebekah Chesterfield, LCSW 04/04/17 11:10 AM

## 2017-04-06 DIAGNOSIS — N186 End stage renal disease: Secondary | ICD-10-CM | POA: Diagnosis not present

## 2017-04-06 DIAGNOSIS — N2581 Secondary hyperparathyroidism of renal origin: Secondary | ICD-10-CM | POA: Diagnosis not present

## 2017-04-06 DIAGNOSIS — E1129 Type 2 diabetes mellitus with other diabetic kidney complication: Secondary | ICD-10-CM | POA: Diagnosis not present

## 2017-04-06 DIAGNOSIS — D509 Iron deficiency anemia, unspecified: Secondary | ICD-10-CM | POA: Diagnosis not present

## 2017-04-09 ENCOUNTER — Telehealth: Payer: Self-pay | Admitting: Internal Medicine

## 2017-04-09 DIAGNOSIS — D509 Iron deficiency anemia, unspecified: Secondary | ICD-10-CM | POA: Diagnosis not present

## 2017-04-09 DIAGNOSIS — N186 End stage renal disease: Secondary | ICD-10-CM | POA: Diagnosis not present

## 2017-04-09 DIAGNOSIS — E1129 Type 2 diabetes mellitus with other diabetic kidney complication: Secondary | ICD-10-CM | POA: Diagnosis not present

## 2017-04-09 DIAGNOSIS — N2581 Secondary hyperparathyroidism of renal origin: Secondary | ICD-10-CM | POA: Diagnosis not present

## 2017-04-09 NOTE — Telephone Encounter (Signed)
[employer] [employer] is awaiting office notes for the approval of the CGM supplies.  They are awaiting proof that the pt is testing at least 4 times a day

## 2017-04-09 NOTE — Telephone Encounter (Signed)
Pt was told to test 3x a day, we have received no fax in regards to this so we can not send records with out it. No call back number left

## 2017-04-11 ENCOUNTER — Other Ambulatory Visit: Payer: Self-pay | Admitting: Cardiovascular Disease

## 2017-04-11 DIAGNOSIS — N186 End stage renal disease: Secondary | ICD-10-CM | POA: Diagnosis not present

## 2017-04-11 DIAGNOSIS — I739 Peripheral vascular disease, unspecified: Secondary | ICD-10-CM

## 2017-04-11 DIAGNOSIS — N2581 Secondary hyperparathyroidism of renal origin: Secondary | ICD-10-CM | POA: Diagnosis not present

## 2017-04-11 DIAGNOSIS — D509 Iron deficiency anemia, unspecified: Secondary | ICD-10-CM | POA: Diagnosis not present

## 2017-04-11 DIAGNOSIS — E1129 Type 2 diabetes mellitus with other diabetic kidney complication: Secondary | ICD-10-CM | POA: Diagnosis not present

## 2017-04-13 DIAGNOSIS — E1129 Type 2 diabetes mellitus with other diabetic kidney complication: Secondary | ICD-10-CM | POA: Diagnosis not present

## 2017-04-13 DIAGNOSIS — N186 End stage renal disease: Secondary | ICD-10-CM | POA: Diagnosis not present

## 2017-04-13 DIAGNOSIS — N2581 Secondary hyperparathyroidism of renal origin: Secondary | ICD-10-CM | POA: Diagnosis not present

## 2017-04-13 DIAGNOSIS — D509 Iron deficiency anemia, unspecified: Secondary | ICD-10-CM | POA: Diagnosis not present

## 2017-04-14 DIAGNOSIS — I129 Hypertensive chronic kidney disease with stage 1 through stage 4 chronic kidney disease, or unspecified chronic kidney disease: Secondary | ICD-10-CM | POA: Diagnosis not present

## 2017-04-14 DIAGNOSIS — Z992 Dependence on renal dialysis: Secondary | ICD-10-CM | POA: Diagnosis not present

## 2017-04-14 DIAGNOSIS — N186 End stage renal disease: Secondary | ICD-10-CM | POA: Diagnosis not present

## 2017-04-16 DIAGNOSIS — E1129 Type 2 diabetes mellitus with other diabetic kidney complication: Secondary | ICD-10-CM | POA: Diagnosis not present

## 2017-04-16 DIAGNOSIS — N2581 Secondary hyperparathyroidism of renal origin: Secondary | ICD-10-CM | POA: Diagnosis not present

## 2017-04-16 DIAGNOSIS — N186 End stage renal disease: Secondary | ICD-10-CM | POA: Diagnosis not present

## 2017-04-16 DIAGNOSIS — D631 Anemia in chronic kidney disease: Secondary | ICD-10-CM | POA: Diagnosis not present

## 2017-04-16 DIAGNOSIS — D509 Iron deficiency anemia, unspecified: Secondary | ICD-10-CM | POA: Diagnosis not present

## 2017-04-17 ENCOUNTER — Encounter: Payer: Self-pay | Admitting: Family Medicine

## 2017-04-17 ENCOUNTER — Ambulatory Visit: Payer: Medicare Other | Attending: Family Medicine | Admitting: Family Medicine

## 2017-04-17 VITALS — BP 116/74 | HR 80 | Temp 99.0°F | Ht 70.0 in | Wt 223.2 lb

## 2017-04-17 DIAGNOSIS — I509 Heart failure, unspecified: Secondary | ICD-10-CM | POA: Insufficient documentation

## 2017-04-17 DIAGNOSIS — E1151 Type 2 diabetes mellitus with diabetic peripheral angiopathy without gangrene: Secondary | ICD-10-CM | POA: Insufficient documentation

## 2017-04-17 DIAGNOSIS — M25562 Pain in left knee: Secondary | ICD-10-CM | POA: Diagnosis not present

## 2017-04-17 DIAGNOSIS — F4322 Adjustment disorder with anxiety: Secondary | ICD-10-CM | POA: Diagnosis not present

## 2017-04-17 DIAGNOSIS — E1140 Type 2 diabetes mellitus with diabetic neuropathy, unspecified: Secondary | ICD-10-CM | POA: Insufficient documentation

## 2017-04-17 DIAGNOSIS — Z79891 Long term (current) use of opiate analgesic: Secondary | ICD-10-CM | POA: Insufficient documentation

## 2017-04-17 DIAGNOSIS — I132 Hypertensive heart and chronic kidney disease with heart failure and with stage 5 chronic kidney disease, or end stage renal disease: Secondary | ICD-10-CM | POA: Diagnosis not present

## 2017-04-17 DIAGNOSIS — Z9842 Cataract extraction status, left eye: Secondary | ICD-10-CM | POA: Insufficient documentation

## 2017-04-17 DIAGNOSIS — F329 Major depressive disorder, single episode, unspecified: Secondary | ICD-10-CM | POA: Diagnosis not present

## 2017-04-17 DIAGNOSIS — E1136 Type 2 diabetes mellitus with diabetic cataract: Secondary | ICD-10-CM | POA: Insufficient documentation

## 2017-04-17 DIAGNOSIS — M109 Gout, unspecified: Secondary | ICD-10-CM | POA: Insufficient documentation

## 2017-04-17 DIAGNOSIS — Z880 Allergy status to penicillin: Secondary | ICD-10-CM | POA: Insufficient documentation

## 2017-04-17 DIAGNOSIS — E1149 Type 2 diabetes mellitus with other diabetic neurological complication: Secondary | ICD-10-CM

## 2017-04-17 DIAGNOSIS — Z8673 Personal history of transient ischemic attack (TIA), and cerebral infarction without residual deficits: Secondary | ICD-10-CM | POA: Diagnosis not present

## 2017-04-17 DIAGNOSIS — Z9889 Other specified postprocedural states: Secondary | ICD-10-CM | POA: Insufficient documentation

## 2017-04-17 DIAGNOSIS — M79604 Pain in right leg: Secondary | ICD-10-CM | POA: Diagnosis not present

## 2017-04-17 DIAGNOSIS — E785 Hyperlipidemia, unspecified: Secondary | ICD-10-CM | POA: Diagnosis not present

## 2017-04-17 DIAGNOSIS — R197 Diarrhea, unspecified: Secondary | ICD-10-CM | POA: Insufficient documentation

## 2017-04-17 DIAGNOSIS — Z7982 Long term (current) use of aspirin: Secondary | ICD-10-CM | POA: Insufficient documentation

## 2017-04-17 DIAGNOSIS — E1122 Type 2 diabetes mellitus with diabetic chronic kidney disease: Secondary | ICD-10-CM

## 2017-04-17 DIAGNOSIS — Z9049 Acquired absence of other specified parts of digestive tract: Secondary | ICD-10-CM | POA: Insufficient documentation

## 2017-04-17 DIAGNOSIS — I25810 Atherosclerosis of coronary artery bypass graft(s) without angina pectoris: Secondary | ICD-10-CM | POA: Insufficient documentation

## 2017-04-17 DIAGNOSIS — J45909 Unspecified asthma, uncomplicated: Secondary | ICD-10-CM | POA: Diagnosis not present

## 2017-04-17 DIAGNOSIS — M069 Rheumatoid arthritis, unspecified: Secondary | ICD-10-CM | POA: Insufficient documentation

## 2017-04-17 DIAGNOSIS — N186 End stage renal disease: Secondary | ICD-10-CM | POA: Diagnosis not present

## 2017-04-17 DIAGNOSIS — M25561 Pain in right knee: Secondary | ICD-10-CM

## 2017-04-17 DIAGNOSIS — F419 Anxiety disorder, unspecified: Secondary | ICD-10-CM

## 2017-04-17 DIAGNOSIS — Z992 Dependence on renal dialysis: Secondary | ICD-10-CM | POA: Diagnosis not present

## 2017-04-17 DIAGNOSIS — Z91048 Other nonmedicinal substance allergy status: Secondary | ICD-10-CM | POA: Insufficient documentation

## 2017-04-17 DIAGNOSIS — E669 Obesity, unspecified: Secondary | ICD-10-CM | POA: Diagnosis not present

## 2017-04-17 DIAGNOSIS — E039 Hypothyroidism, unspecified: Secondary | ICD-10-CM | POA: Diagnosis not present

## 2017-04-17 DIAGNOSIS — K219 Gastro-esophageal reflux disease without esophagitis: Secondary | ICD-10-CM | POA: Diagnosis not present

## 2017-04-17 DIAGNOSIS — Z794 Long term (current) use of insulin: Secondary | ICD-10-CM | POA: Insufficient documentation

## 2017-04-17 DIAGNOSIS — Z79899 Other long term (current) drug therapy: Secondary | ICD-10-CM | POA: Insufficient documentation

## 2017-04-17 DIAGNOSIS — I252 Old myocardial infarction: Secondary | ICD-10-CM | POA: Insufficient documentation

## 2017-04-17 DIAGNOSIS — Z888 Allergy status to other drugs, medicaments and biological substances status: Secondary | ICD-10-CM | POA: Insufficient documentation

## 2017-04-17 DIAGNOSIS — Z955 Presence of coronary angioplasty implant and graft: Secondary | ICD-10-CM | POA: Insufficient documentation

## 2017-04-17 LAB — GLUCOSE, POCT (MANUAL RESULT ENTRY): POC GLUCOSE: 157 mg/dL — AB (ref 70–99)

## 2017-04-17 MED ORDER — BUSPIRONE HCL 7.5 MG PO TABS: 7.5000 mg | ORAL_TABLET | Freq: Three times a day (TID) | ORAL | 2 refills | Status: DC

## 2017-04-17 MED ORDER — TRAMADOL HCL 50 MG PO TABS: 50.0000 mg | ORAL_TABLET | Freq: Two times a day (BID) | ORAL | 1 refills | Status: DC | PRN

## 2017-04-17 MED ORDER — PREDNISONE 20 MG PO TABS: 20.0000 mg | ORAL_TABLET | Freq: Every day | ORAL | 0 refills | Status: DC

## 2017-04-17 MED ORDER — PANTOPRAZOLE SODIUM 40 MG PO TBEC: 40.0000 mg | DELAYED_RELEASE_TABLET | Freq: Every day | ORAL | 5 refills | Status: DC

## 2017-04-17 MED ORDER — GABAPENTIN 400 MG PO CAPS: 400.0000 mg | ORAL_CAPSULE | Freq: Every day | ORAL | 5 refills | Status: DC

## 2017-04-17 NOTE — Patient Instructions (Signed)
Joint Pain Joint pain can be caused by many things. The joint can be bruised, infected, weak from aging, or sore from exercise. The pain will probably go away if you follow your doctor's instructions for home care. If your joint pain continues, more tests may be needed to help find the cause of your condition. Follow these instructions at home: Watch your condition for any changes. Follow these instructions as told to lessen the pain that you are feeling:  Take medicines only as told by your doctor.  Rest the sore joint for as long as told by your doctor. If your doctor tells you to, raise (elevate) the painful joint above the level of your heart while you are sitting or lying down.  Do not do things that cause pain or make the pain worse.  If told, put ice on the painful area: ? Put ice in a plastic bag. ? Place a towel between your skin and the bag. ? Leave the ice on for 20 minutes, 2-3 times per day.  Wear an elastic bandage, splint, or sling as told by your doctor. Loosen the bandage or splint if your fingers or toes lose feeling (become numb) and tingle, or if they turn cold and blue.  Begin exercising or stretching the joint as told by your doctor. Ask your doctor what types of exercise are safe for you.  Keep all follow-up visits as told by your doctor. This is important.  Contact a doctor if:  Your pain gets worse and medicine does not help it.  Your joint pain does not get better in 3 days.  You have more bruising or swelling.  You have a fever.  You lose 10 pounds (4.5 kg) or more without trying. Get help right away if:  You are not able to move the joint.  Your fingers or toes become numb or they turn cold and blue. This information is not intended to replace advice given to you by your health care provider. Make sure you discuss any questions you have with your health care provider. Document Released: 03/20/2009 Document Revised: 09/07/2015 Document Reviewed:  01/11/2014 Elsevier Interactive Patient Education  2018 [employer]  

## 2017-04-17 NOTE — Progress Notes (Signed)
Subjective:  Patient ID: Brooke Stafford, female    DOB: 1956/02/02  Age: 62 y.o. MRN: [id number]  CC: Hand Pain and Foot Pain   HPI Brooke Stafford  is a 62 year old female with a history of type 2 diabetes mellitus (A1c 6.0), hypertension, CHF (2-D echo 35 to 40% from 2-D echo of 04/2016), CAD status post PCI ,end-stage renal disease (currently on hemodialysis on Mondays, Wednesdays and Fridays), peripheral vascular disease (status post left SFA stent placement in 2012 and 2014 and right SFA and popliteal artery angioplasty and stent placement) here for a follow-up visit.  At her last visit 1 month ago she was commenced on Prozac due to anxiety and depression secondary to grief reaction from the loss of her son who died as a result of a gunshot wound 1 year ago She had also seen the LCSW for counseling at that visit. Today she informs me she discontinued Prozac due to diarrhea which would occur for 3-4 days. She denies being depressed but does intermittently have anxiety.  She complains of pain in her knees, wrists, fingers which also cramp up and this has been going on for the last 4 months with no relief on tramadol.  She informs me she has a diagnosis of rheumatoid arthritis and in the past saw a rheumatologist - Dr. Charlestine Night who retired. She recalls being on prednisone but does not recall taking methotrexate or Enbrel. She was given an appointment at her dialysis center with a Rheumatologist on [address]. (which is too far for her) but that is not until the end of the month and  Past Medical History:  Diagnosis Date  . Anemia   . Anginal pain ([place])   . Anxiety   . Arthritis    "stiff fingers and knees" (08/04/2013), (12/12/2014)  . Asthma   . CAD (coronary artery disease) 2002; 2015   CABG x 6 2002, cath 2011- med Rx stent DES VG-Diag  . CAD (coronary artery disease) of artery bypass graft; DES to VG-Diag 09/28/13 11/09/2013  . Cataract   . CHF (congestive heart failure)  ([place])    "in 2002" (11/26/2012)  . Chronic bronchitis ([place])    "q year; in the winter"   . CKD (chronic kidney disease)    stage 4, followed by [facility]  . Coronary artery disease 2002   CABG x 6. Cath 5/11- med Rx  . Diabetic neuropathy ([place])   . GERD (gastroesophageal reflux disease)   . Gout    "right big toe"  . Headache    "~ q week" (08/04/2013); "~ twice/month" (12/12/2014)  . History of blood transfusion 2002   "when I had OHS"  . Hyperlipidemia   . Hypertension   . Hypothyroid    treated  . Migraines    "couple times/year" (08/04/2013), (12/12/2014)  . Myocardial infarction ([place]) 2000; 2002; 2011  . Obesity (BMI 35.0-39.9 without comorbidity)   . Peripheral vascular disease ([place]) 12/12   LSFA PTA  . Pneumonia    "3 times I think" (12/12/2014)  . Stroke [facility])    " mini stroke"  . Type II diabetes mellitus ([place])     Past Surgical History:  Procedure Laterality Date  . ABDOMINAL AORTAGRAM N/A 04/05/2011   Procedure: ABDOMINAL AORTAGRAM;  Surgeon: Lorretta Harp, MD;  Location[facility] CATH LAB;  Service: Cardiovascular;  Laterality: N/A;  . ABDOMINAL AORTOGRAM N/A 10/30/2016   Procedure: Abdominal Aortogram;  Surgeon: Wellington Hampshire, MD;  Location:  [place] INVASIVE CV LAB;  Service: Cardiovascular;  Laterality: N/A;  . APPENDECTOMY  1980  . AV FISTULA PLACEMENT Left 05/10/2016   Procedure: LEFT ARM ARTERIOVENOUS (AV) FISTULA CREATION;  Surgeon: Rosetta Posner, MD;  Location: [place];  Service: Vascular;  Laterality: Left;  . BASCILIC VEIN TRANSPOSITION Left 06/26/2016   Procedure: SECOND STAGE BASILIC VEIN TRANSPOSITION;  Surgeon: Rosetta Posner, MD;  Location: [place];  Service: Vascular;  Laterality: Left;  . BREAST CYST EXCISION Right 1970's  . CARDIAC CATHETERIZATION  2002  . CARDIAC CATHETERIZATION N/A 12/12/2014   Procedure: Left Heart Cath and Cors/Grafts Angiography;  Surgeon: Peter M Martinique, MD;  Location: [place] CV LAB;  Service: Cardiovascular;  Laterality: N/A;    . CARDIAC CATHETERIZATION N/A 12/12/2014   Procedure: Coronary Stent Intervention;  Surgeon: Peter M Martinique, MD;  Location: [place] CV LAB;  Service: Cardiovascular;  Laterality: N/A;  . CATARACT EXTRACTION, BILATERAL    . CESAREAN SECTION  1978; 1980  . CHOLECYSTECTOMY  1982  . CORONARY ANGIOPLASTY WITH STENT PLACEMENT  2004; 2012   "I have 2 stents" (08/04/2013)  . CORONARY ANGIOPLASTY WITH STENT PLACEMENT  09/28/13   PTCA/ DES Xience stent to VG-Diag   . CORONARY ARTERY BYPASS GRAFT  11/20/2000   x6 LIMA to distal LAD, svg to first diag, svg to ramus intermediate branch and swquential SVG to cir marginal branch, SVG to posterior descending coronary and sequential SVG to first right posterolateral branch  . eye injections    . INSERTION OF DIALYSIS CATHETER Right 05/10/2016   Procedure: INSERTION OF DIALYSIS CATHETER - Right Internal Jugular Placement;  Surgeon: Rosetta Posner, MD;  Location: [place];  Service: Vascular;  Laterality: Right;  . LEFT HEART CATHETERIZATION WITH CORONARY/GRAFT ANGIOGRAM N/A 09/28/2013   Procedure: LEFT HEART CATHETERIZATION WITH Beatrix Fetters;  Surgeon: Peter M Martinique, MD;  Location: [facility] CATH LAB;  Service: Cardiovascular;  Laterality: N/A;  . LOWER EXTREMITY ANGIOGRAM  12/01/2012   Procedure: LOWER EXTREMITY ANGIOGRAM;  Surgeon: Lorretta Harp, MD;  Location[facility] CATH LAB;  Service: Cardiovascular;;  . LOWER EXTREMITY ANGIOGRAPHY Right 10/30/2016   Procedure: Lower Extremity Angiography;  Surgeon: Wellington Hampshire, MD;  Location: [place] CV LAB;  Service: Cardiovascular;  Laterality: Right;  . NM MYOCAR PERF WALL MOTION  08/27/2004   negative  . PERCUTANEOUS STENT INTERVENTION Left 12/01/2012   Procedure: PERCUTANEOUS STENT INTERVENTION;  Surgeon: Lorretta Harp, MD;  Location: [facility] CATH LAB;  Service: Cardiovascular;  Laterality: Left;  Left SFA  . PERIPHERAL ARTERIAL STENT GRAFT Left    SFA/notes 04/07/2011 (11/30/2012)  . PERIPHERAL VASCULAR  ATHERECTOMY Right 10/30/2016   Procedure: Peripheral Vascular Atherectomy;  Surgeon: Wellington Hampshire, MD;  Location: [place] CV LAB;  Service: Cardiovascular;  Laterality: Right;  cancel unable to  . PERIPHERAL VASCULAR INTERVENTION Right 10/30/2016   Procedure: Peripheral Vascular Intervention;  Surgeon: Wellington Hampshire, MD;  Location: [place] CV LAB;  Service: Cardiovascular;  Laterality: Right;  SFA  . RENAL ANGIOGRAM N/A 04/05/2011   Procedure: RENAL ANGIOGRAM;  Surgeon: Lorretta Harp, MD;  Location: [facility] CATH LAB;  Service: Cardiovascular;  Laterality: N/A;  . TUBAL LIGATION  1980    Allergies  Allergen Reactions  . Digoxin And Related Diarrhea and Other (See Comments)    TOXIC DRUG LEVELS Patient stated she almost died. Had flu like symptoms as well as diarrhea.  . Hydralazine Shortness Of Breath  . Penicillins [facility] and  Other (See Comments)    HIGH FEVER  Has patient had a PCN reaction causing immediate rash, facial/tongue/throat swelling, SOB or lightheadedness with hypotension: No Has patient had a PCN reaction causing SEVERE RASH INVOLVING MUCUS MEMBRANES or SKIN NECROSIS  #  #  #  YES  #  #  #  Has patient had a PCN reaction that required hospitalization: Unk Has patient had a PCN reaction occurring within the last 10 years: Unk If all of the above answers are "NO", then may proceed with Cephalosporin use.   [name] Lisinopril Other (See Comments)    Felt like she had the flu. Was very sick!!!  . Adhesive [Tape] Rash and Other (See Comments)    bruising     Outpatient Medications Prior to Visit  Medication Sig Dispense Refill  . allopurinol (ZYLOPRIM) 300 MG tablet Take 1 tablet (300 mg total) daily by mouth. 90 tablet 1  . aspirin 81 MG chewable tablet Chew 1 tablet (81 mg total) by mouth daily. 30 tablet 10  . atorvastatin (LIPITOR) 40 MG tablet Take 1 tablet (40 mg total) every evening by mouth. 90 tablet 1  . cetirizine (ZYRTEC) 10 MG tablet Take  10 mg by mouth daily.    . clopidogrel (PLAVIX) 75 MG tablet Take 1 tablet (75 mg total) daily by mouth. 90 tablet 1  . fluticasone (FLONASE) 50 MCG/ACT nasal spray Place 2 sprays daily as needed into both nostrils for allergies or rhinitis. 16 g 3  . hydrOXYzine (ATARAX/VISTARIL) 25 MG tablet Take 0.5 tablets (12.5 mg total) by mouth daily as needed. 30 tablet 0  . insulin aspart (NOVOLOG FLEXPEN) 100 UNIT/ML FlexPen Inject 25 units before breakfast and 20 units before lunch and dinner 70 pen 0  . Insulin Glargine (BASAGLAR KWIKPEN) 100 UNIT/ML SOPN INJECT 23 UNITS INTO THE SKIN AT BEDTIME 45 mL 3  . isosorbide mononitrate (IMDUR) 60 MG 24 hr tablet take 2 tablets by mouth once daily 180 tablet 1  . levothyroxine (SYNTHROID, LEVOTHROID) 25 MCG tablet take 1 tablet by mouth every morning before BREAKFAST 30 tablet 2  . metoprolol tartrate (LOPRESSOR) 25 MG tablet Take 0.5 tablets (12.5 mg total) by mouth 2 (two) times daily. 90 tablet 3  . nitroGLYCERIN (NITROSTAT) 0.4 MG SL tablet Place 1 tablet (0.4 mg total) under the tongue every 5 (five) minutes as needed. (Patient taking differently: Place 0.4 mg under the tongue every 5 (five) minutes as needed for chest pain. ) 30 tablet 2  . sevelamer (RENAGEL) 800 MG tablet Take 2,400 mg by mouth 3 (three) times daily with meals.    . gabapentin (NEURONTIN) 400 MG capsule Take 400 mg by mouth at bedtime.     . pantoprazole (PROTONIX) 40 MG tablet Take 1 tablet (40 mg total) by mouth daily. 30 tablet 0  . traMADol (ULTRAM) 50 MG tablet Take 1 tablet (50 mg total) by mouth every 12 (twelve) hours as needed. 40 tablet 1  . FLUoxetine (PROZAC) 10 MG capsule Take 1 capsule (10 mg total) by mouth daily. (Patient not taking: Reported on 04/17/2017) 30 capsule 1   No facility-administered medications prior to visit.     ROS Review of Systems  Constitutional: Negative for activity change, appetite change and fatigue.  HENT: Negative for congestion, sinus  pressure and sore throat.   Eyes: Negative for visual disturbance.  Respiratory: Negative for cough, chest tightness, shortness of breath and wheezing.   Cardiovascular: Negative for chest pain and palpitations.  Gastrointestinal: Negative for abdominal distention, abdominal pain and constipation.  Endocrine: Negative for polydipsia.  Genitourinary: Negative for dysuria and frequency.  Musculoskeletal:       See hpi  Skin: Negative for rash.  Neurological: Negative for tremors, light-headedness and numbness.  Hematological: Does not bruise/bleed easily.  Psychiatric/Behavioral: Negative for agitation and behavioral problems.    Objective:  BP 116/74   Pulse 80   Temp 99 F (37.2 C) (Oral)   Ht 5\' 10"  (1.778 m)   Wt 223 lb 3.2 oz (101.2 kg)   LMP 10/13/2014 (Exact Date)   BMI 32.03 kg/m   BP/Weight 04/17/2017 04/03/2017 10/09/9483  Systolic BP 462 703 500  Diastolic BP 74 69 69  Wt. (Lbs) 223.2 221.2 -  BMI 32.03 31.74 31.85      Physical Exam  Constitutional: She is oriented to person, place, and time. She appears well-developed and well-nourished.  Cardiovascular: Normal rate, normal heart sounds and intact distal pulses.  No murmur heard. Pulmonary/Chest: Effort normal and breath sounds normal. She has no wheezes. She has no rales. She exhibits no tenderness.  Abdominal: Soft. Bowel sounds are normal. She exhibits no distension and no mass. There is no tenderness.  Musculoskeletal: Normal range of motion.  Left arm AV fistula with palpable thrill Bouchards nodules on fingers of both hands with lateral deviation of right index finger. MCP joints are tender to palpation bilaterally. No edema in hands Knees are tender to palpation on range of motion bilaterally  Neurological: She is alert and oriented to person, place, and time.  Psychiatric: She has a normal mood and affect.    Lab Results  Component Value Date   HGBA1C 6.0 02/25/2017    Assessment & Plan:   1.  Type 2 diabetes mellitus with ESRD (end-stage renal disease) ([place]) Controlled with A1c of 6.0 Continue current regimen Follow-up with endocrine - POCT glucose (manual entry)  2. Pain of right lower extremity - traMADol (ULTRAM) 50 MG tablet; Take 1 tablet (50 mg total) by mouth every 12 (twelve) hours as needed.  Dispense: 40 tablet; Refill: 1  3. Arthralgia of both knees Multiple arthralgias We will send a blood work to evaluate for rheumatoid arthritis even though the patient does give a history of such in the past but has never been on DMARDs Short-term prednisone-side effects discussed including hyperglycemia - Ambulatory referral to Rheumatology - Rheumatoid factor - RA Qn+CCP(IgG/A)+SjoSSA+SjoSSB  4. Anxiety Unable to tolerate Prozac due to diarrhea which I have discontinued Comments BuSpar and take this in addition to hydroxyzine - busPIRone (BUSPAR) 7.5 MG tablet; Take 1 tablet (7.5 mg total) by mouth 3 (three) times daily.  Dispense: 90 tablet; Refill: 2  5. Other diabetic neurological complication associated with type 2 diabetes mellitus (HCC) Stable - gabapentin (NEURONTIN) 400 MG capsule; Take 1 capsule (400 mg total) by mouth at bedtime.  Dispense: 30 capsule; Refill: 5  6. Gastroesophageal reflux disease without esophagitis Controlled - pantoprazole (PROTONIX) 40 MG tablet; Take 1 tablet (40 mg total) by mouth daily.  Dispense: 30 tablet; Refill: 5   Meds ordered this encounter  Medications  . gabapentin (NEURONTIN) 400 MG capsule    Sig: Take 1 capsule (400 mg total) by mouth at bedtime.    Dispense:  30 capsule    Refill:  5  . pantoprazole (PROTONIX) 40 MG tablet    Sig: Take 1 tablet (40 mg total) by mouth daily.    Dispense:  30 tablet    Refill:  5  . traMADol (ULTRAM) 50 MG tablet    Sig: Take 1 tablet (50 mg total) by mouth every 12 (twelve) hours as needed.    Dispense:  40 tablet    Refill:  1  . predniSONE (DELTASONE) 20 MG tablet    Sig: Take 1  tablet (20 mg total) by mouth daily with breakfast.    Dispense:  5 tablet    Refill:  0  . busPIRone (BUSPAR) 7.5 MG tablet    Sig: Take 1 tablet (7.5 mg total) by mouth 3 (three) times daily.    Dispense:  90 tablet    Refill:  2    Discontinue Prozac    Follow-up: Return in about 3 months (around 07/16/2017) for Follow-up of chronic medical conditions.   Arnoldo Morale MD

## 2017-04-18 DIAGNOSIS — D631 Anemia in chronic kidney disease: Secondary | ICD-10-CM | POA: Diagnosis not present

## 2017-04-18 DIAGNOSIS — D509 Iron deficiency anemia, unspecified: Secondary | ICD-10-CM | POA: Diagnosis not present

## 2017-04-18 DIAGNOSIS — E1129 Type 2 diabetes mellitus with other diabetic kidney complication: Secondary | ICD-10-CM | POA: Diagnosis not present

## 2017-04-18 DIAGNOSIS — N2581 Secondary hyperparathyroidism of renal origin: Secondary | ICD-10-CM | POA: Diagnosis not present

## 2017-04-18 DIAGNOSIS — N186 End stage renal disease: Secondary | ICD-10-CM | POA: Diagnosis not present

## 2017-04-19 LAB — RA QN+CCP(IGG/A)+SJOSSA+SJOSSB
Cyclic Citrullin Peptide Ab: 8 units (ref 0–19)
ENA SSA (RO) Ab: <0.2 AI (ref 0.0–0.9)
ENA SSB (LA) Ab: <0.2 AI (ref 0.0–0.9)
RHEUMATOID FACTOR: 61.1 [IU]/mL — AB (ref 0.0–13.9)

## 2017-04-21 ENCOUNTER — Telehealth: Payer: Self-pay | Admitting: Internal Medicine

## 2017-04-21 DIAGNOSIS — D509 Iron deficiency anemia, unspecified: Secondary | ICD-10-CM | POA: Diagnosis not present

## 2017-04-21 DIAGNOSIS — N186 End stage renal disease: Secondary | ICD-10-CM | POA: Diagnosis not present

## 2017-04-21 DIAGNOSIS — N2581 Secondary hyperparathyroidism of renal origin: Secondary | ICD-10-CM | POA: Diagnosis not present

## 2017-04-21 DIAGNOSIS — E1129 Type 2 diabetes mellitus with other diabetic kidney complication: Secondary | ICD-10-CM | POA: Diagnosis not present

## 2017-04-21 DIAGNOSIS — D631 Anemia in chronic kidney disease: Secondary | ICD-10-CM | POA: Diagnosis not present

## 2017-04-21 MED ORDER — INSULIN ASPART 100 UNIT/ML FLEXPEN: PEN_INJECTOR | SUBCUTANEOUS | 0 refills | Status: DC

## 2017-04-21 NOTE — Telephone Encounter (Signed)
.  insulin aspart (NOVOLOG FLEXPEN) 100 UNIT/ML FlexPen    Pt is out of Refills.     [id number], [place]

## 2017-04-21 NOTE — Telephone Encounter (Signed)
Sent to pharmacy 

## 2017-04-23 DIAGNOSIS — D631 Anemia in chronic kidney disease: Secondary | ICD-10-CM | POA: Diagnosis not present

## 2017-04-23 DIAGNOSIS — N2581 Secondary hyperparathyroidism of renal origin: Secondary | ICD-10-CM | POA: Diagnosis not present

## 2017-04-23 DIAGNOSIS — D509 Iron deficiency anemia, unspecified: Secondary | ICD-10-CM | POA: Diagnosis not present

## 2017-04-23 DIAGNOSIS — E1129 Type 2 diabetes mellitus with other diabetic kidney complication: Secondary | ICD-10-CM | POA: Diagnosis not present

## 2017-04-23 DIAGNOSIS — E039 Hypothyroidism, unspecified: Secondary | ICD-10-CM | POA: Diagnosis not present

## 2017-04-23 DIAGNOSIS — N186 End stage renal disease: Secondary | ICD-10-CM | POA: Diagnosis not present

## 2017-04-25 DIAGNOSIS — N2581 Secondary hyperparathyroidism of renal origin: Secondary | ICD-10-CM | POA: Diagnosis not present

## 2017-04-25 DIAGNOSIS — D631 Anemia in chronic kidney disease: Secondary | ICD-10-CM | POA: Diagnosis not present

## 2017-04-25 DIAGNOSIS — N186 End stage renal disease: Secondary | ICD-10-CM | POA: Diagnosis not present

## 2017-04-25 DIAGNOSIS — D509 Iron deficiency anemia, unspecified: Secondary | ICD-10-CM | POA: Diagnosis not present

## 2017-04-25 DIAGNOSIS — E1129 Type 2 diabetes mellitus with other diabetic kidney complication: Secondary | ICD-10-CM | POA: Diagnosis not present

## 2017-04-28 DIAGNOSIS — N2581 Secondary hyperparathyroidism of renal origin: Secondary | ICD-10-CM | POA: Diagnosis not present

## 2017-04-28 DIAGNOSIS — E1129 Type 2 diabetes mellitus with other diabetic kidney complication: Secondary | ICD-10-CM | POA: Diagnosis not present

## 2017-04-28 DIAGNOSIS — D631 Anemia in chronic kidney disease: Secondary | ICD-10-CM | POA: Diagnosis not present

## 2017-04-28 DIAGNOSIS — N186 End stage renal disease: Secondary | ICD-10-CM | POA: Diagnosis not present

## 2017-04-28 DIAGNOSIS — D509 Iron deficiency anemia, unspecified: Secondary | ICD-10-CM | POA: Diagnosis not present

## 2017-04-30 DIAGNOSIS — E1129 Type 2 diabetes mellitus with other diabetic kidney complication: Secondary | ICD-10-CM | POA: Diagnosis not present

## 2017-04-30 DIAGNOSIS — D631 Anemia in chronic kidney disease: Secondary | ICD-10-CM | POA: Diagnosis not present

## 2017-04-30 DIAGNOSIS — D509 Iron deficiency anemia, unspecified: Secondary | ICD-10-CM | POA: Diagnosis not present

## 2017-04-30 DIAGNOSIS — N186 End stage renal disease: Secondary | ICD-10-CM | POA: Diagnosis not present

## 2017-04-30 DIAGNOSIS — N2581 Secondary hyperparathyroidism of renal origin: Secondary | ICD-10-CM | POA: Diagnosis not present

## 2017-05-02 DIAGNOSIS — E1129 Type 2 diabetes mellitus with other diabetic kidney complication: Secondary | ICD-10-CM | POA: Diagnosis not present

## 2017-05-02 DIAGNOSIS — N2581 Secondary hyperparathyroidism of renal origin: Secondary | ICD-10-CM | POA: Diagnosis not present

## 2017-05-02 DIAGNOSIS — D631 Anemia in chronic kidney disease: Secondary | ICD-10-CM | POA: Diagnosis not present

## 2017-05-02 DIAGNOSIS — D509 Iron deficiency anemia, unspecified: Secondary | ICD-10-CM | POA: Diagnosis not present

## 2017-05-02 DIAGNOSIS — N186 End stage renal disease: Secondary | ICD-10-CM | POA: Diagnosis not present

## 2017-05-05 DIAGNOSIS — D509 Iron deficiency anemia, unspecified: Secondary | ICD-10-CM | POA: Diagnosis not present

## 2017-05-05 DIAGNOSIS — D631 Anemia in chronic kidney disease: Secondary | ICD-10-CM | POA: Diagnosis not present

## 2017-05-05 DIAGNOSIS — N186 End stage renal disease: Secondary | ICD-10-CM | POA: Diagnosis not present

## 2017-05-05 DIAGNOSIS — E1129 Type 2 diabetes mellitus with other diabetic kidney complication: Secondary | ICD-10-CM | POA: Diagnosis not present

## 2017-05-05 DIAGNOSIS — N2581 Secondary hyperparathyroidism of renal origin: Secondary | ICD-10-CM | POA: Diagnosis not present

## 2017-05-07 DIAGNOSIS — N186 End stage renal disease: Secondary | ICD-10-CM | POA: Diagnosis not present

## 2017-05-07 DIAGNOSIS — E1129 Type 2 diabetes mellitus with other diabetic kidney complication: Secondary | ICD-10-CM | POA: Diagnosis not present

## 2017-05-07 DIAGNOSIS — N2581 Secondary hyperparathyroidism of renal origin: Secondary | ICD-10-CM | POA: Diagnosis not present

## 2017-05-07 DIAGNOSIS — D631 Anemia in chronic kidney disease: Secondary | ICD-10-CM | POA: Diagnosis not present

## 2017-05-07 DIAGNOSIS — D509 Iron deficiency anemia, unspecified: Secondary | ICD-10-CM | POA: Diagnosis not present

## 2017-05-09 DIAGNOSIS — D509 Iron deficiency anemia, unspecified: Secondary | ICD-10-CM | POA: Diagnosis not present

## 2017-05-09 DIAGNOSIS — E1129 Type 2 diabetes mellitus with other diabetic kidney complication: Secondary | ICD-10-CM | POA: Diagnosis not present

## 2017-05-09 DIAGNOSIS — D631 Anemia in chronic kidney disease: Secondary | ICD-10-CM | POA: Diagnosis not present

## 2017-05-09 DIAGNOSIS — N186 End stage renal disease: Secondary | ICD-10-CM | POA: Diagnosis not present

## 2017-05-09 DIAGNOSIS — N2581 Secondary hyperparathyroidism of renal origin: Secondary | ICD-10-CM | POA: Diagnosis not present

## 2017-05-12 DIAGNOSIS — D509 Iron deficiency anemia, unspecified: Secondary | ICD-10-CM | POA: Diagnosis not present

## 2017-05-12 DIAGNOSIS — D631 Anemia in chronic kidney disease: Secondary | ICD-10-CM | POA: Diagnosis not present

## 2017-05-12 DIAGNOSIS — N2581 Secondary hyperparathyroidism of renal origin: Secondary | ICD-10-CM | POA: Diagnosis not present

## 2017-05-12 DIAGNOSIS — E1129 Type 2 diabetes mellitus with other diabetic kidney complication: Secondary | ICD-10-CM | POA: Diagnosis not present

## 2017-05-12 DIAGNOSIS — N186 End stage renal disease: Secondary | ICD-10-CM | POA: Diagnosis not present

## 2017-05-14 DIAGNOSIS — E1129 Type 2 diabetes mellitus with other diabetic kidney complication: Secondary | ICD-10-CM | POA: Diagnosis not present

## 2017-05-14 DIAGNOSIS — D509 Iron deficiency anemia, unspecified: Secondary | ICD-10-CM | POA: Diagnosis not present

## 2017-05-14 DIAGNOSIS — N2581 Secondary hyperparathyroidism of renal origin: Secondary | ICD-10-CM | POA: Diagnosis not present

## 2017-05-14 DIAGNOSIS — D631 Anemia in chronic kidney disease: Secondary | ICD-10-CM | POA: Diagnosis not present

## 2017-05-14 DIAGNOSIS — N186 End stage renal disease: Secondary | ICD-10-CM | POA: Diagnosis not present

## 2017-05-15 DIAGNOSIS — I129 Hypertensive chronic kidney disease with stage 1 through stage 4 chronic kidney disease, or unspecified chronic kidney disease: Secondary | ICD-10-CM | POA: Diagnosis not present

## 2017-05-15 DIAGNOSIS — N186 End stage renal disease: Secondary | ICD-10-CM | POA: Diagnosis not present

## 2017-05-15 DIAGNOSIS — M2550 Pain in unspecified joint: Secondary | ICD-10-CM | POA: Diagnosis not present

## 2017-05-15 DIAGNOSIS — Z992 Dependence on renal dialysis: Secondary | ICD-10-CM | POA: Diagnosis not present

## 2017-05-15 DIAGNOSIS — Z6832 Body mass index (BMI) 32.0-32.9, adult: Secondary | ICD-10-CM | POA: Diagnosis not present

## 2017-05-15 DIAGNOSIS — M109 Gout, unspecified: Secondary | ICD-10-CM | POA: Diagnosis not present

## 2017-05-15 DIAGNOSIS — M79642 Pain in left hand: Secondary | ICD-10-CM | POA: Diagnosis not present

## 2017-05-15 DIAGNOSIS — M79641 Pain in right hand: Secondary | ICD-10-CM | POA: Diagnosis not present

## 2017-05-15 DIAGNOSIS — E669 Obesity, unspecified: Secondary | ICD-10-CM | POA: Diagnosis not present

## 2017-05-16 DIAGNOSIS — N186 End stage renal disease: Secondary | ICD-10-CM | POA: Diagnosis not present

## 2017-05-16 DIAGNOSIS — N2581 Secondary hyperparathyroidism of renal origin: Secondary | ICD-10-CM | POA: Diagnosis not present

## 2017-05-16 DIAGNOSIS — Z992 Dependence on renal dialysis: Secondary | ICD-10-CM | POA: Diagnosis not present

## 2017-05-16 DIAGNOSIS — E1129 Type 2 diabetes mellitus with other diabetic kidney complication: Secondary | ICD-10-CM | POA: Diagnosis not present

## 2017-05-16 DIAGNOSIS — D631 Anemia in chronic kidney disease: Secondary | ICD-10-CM | POA: Diagnosis not present

## 2017-05-16 DIAGNOSIS — D509 Iron deficiency anemia, unspecified: Secondary | ICD-10-CM | POA: Diagnosis not present

## 2017-05-16 DIAGNOSIS — I129 Hypertensive chronic kidney disease with stage 1 through stage 4 chronic kidney disease, or unspecified chronic kidney disease: Secondary | ICD-10-CM | POA: Diagnosis not present

## 2017-05-19 DIAGNOSIS — D509 Iron deficiency anemia, unspecified: Secondary | ICD-10-CM | POA: Diagnosis not present

## 2017-05-19 DIAGNOSIS — E1129 Type 2 diabetes mellitus with other diabetic kidney complication: Secondary | ICD-10-CM | POA: Diagnosis not present

## 2017-05-19 DIAGNOSIS — D631 Anemia in chronic kidney disease: Secondary | ICD-10-CM | POA: Diagnosis not present

## 2017-05-19 DIAGNOSIS — N2581 Secondary hyperparathyroidism of renal origin: Secondary | ICD-10-CM | POA: Diagnosis not present

## 2017-05-19 DIAGNOSIS — N186 End stage renal disease: Secondary | ICD-10-CM | POA: Diagnosis not present

## 2017-05-21 DIAGNOSIS — N186 End stage renal disease: Secondary | ICD-10-CM | POA: Diagnosis not present

## 2017-05-21 DIAGNOSIS — E1129 Type 2 diabetes mellitus with other diabetic kidney complication: Secondary | ICD-10-CM | POA: Diagnosis not present

## 2017-05-21 DIAGNOSIS — D631 Anemia in chronic kidney disease: Secondary | ICD-10-CM | POA: Diagnosis not present

## 2017-05-21 DIAGNOSIS — D509 Iron deficiency anemia, unspecified: Secondary | ICD-10-CM | POA: Diagnosis not present

## 2017-05-21 DIAGNOSIS — N2581 Secondary hyperparathyroidism of renal origin: Secondary | ICD-10-CM | POA: Diagnosis not present

## 2017-05-23 DIAGNOSIS — D631 Anemia in chronic kidney disease: Secondary | ICD-10-CM | POA: Diagnosis not present

## 2017-05-23 DIAGNOSIS — N2581 Secondary hyperparathyroidism of renal origin: Secondary | ICD-10-CM | POA: Diagnosis not present

## 2017-05-23 DIAGNOSIS — N186 End stage renal disease: Secondary | ICD-10-CM | POA: Diagnosis not present

## 2017-05-23 DIAGNOSIS — E1129 Type 2 diabetes mellitus with other diabetic kidney complication: Secondary | ICD-10-CM | POA: Diagnosis not present

## 2017-05-23 DIAGNOSIS — D509 Iron deficiency anemia, unspecified: Secondary | ICD-10-CM | POA: Diagnosis not present

## 2017-05-26 DIAGNOSIS — N2581 Secondary hyperparathyroidism of renal origin: Secondary | ICD-10-CM | POA: Diagnosis not present

## 2017-05-26 DIAGNOSIS — N186 End stage renal disease: Secondary | ICD-10-CM | POA: Diagnosis not present

## 2017-05-26 DIAGNOSIS — D509 Iron deficiency anemia, unspecified: Secondary | ICD-10-CM | POA: Diagnosis not present

## 2017-05-26 DIAGNOSIS — E1129 Type 2 diabetes mellitus with other diabetic kidney complication: Secondary | ICD-10-CM | POA: Diagnosis not present

## 2017-05-26 DIAGNOSIS — D631 Anemia in chronic kidney disease: Secondary | ICD-10-CM | POA: Diagnosis not present

## 2017-05-27 ENCOUNTER — Ambulatory Visit (HOSPITAL_COMMUNITY)
Admission: RE | Admit: 2017-05-27 | Discharge: 2017-05-27 | Disposition: A | Discharge status: Home / Self Care | Payer: Medicare Other | Source: Ambulatory Visit | Attending: Internal Medicine | Admitting: Internal Medicine

## 2017-05-27 ENCOUNTER — Encounter (HOSPITAL_COMMUNITY): Payer: Medicare Other

## 2017-05-27 DIAGNOSIS — I771 Stricture of artery: Secondary | ICD-10-CM | POA: Diagnosis not present

## 2017-05-27 DIAGNOSIS — R9439 Abnormal result of other cardiovascular function study: Secondary | ICD-10-CM | POA: Diagnosis not present

## 2017-05-27 DIAGNOSIS — I739 Peripheral vascular disease, unspecified: Secondary | ICD-10-CM | POA: Insufficient documentation

## 2017-05-28 DIAGNOSIS — N2581 Secondary hyperparathyroidism of renal origin: Secondary | ICD-10-CM | POA: Diagnosis not present

## 2017-05-28 DIAGNOSIS — N186 End stage renal disease: Secondary | ICD-10-CM | POA: Diagnosis not present

## 2017-05-28 DIAGNOSIS — E1129 Type 2 diabetes mellitus with other diabetic kidney complication: Secondary | ICD-10-CM | POA: Diagnosis not present

## 2017-05-28 DIAGNOSIS — D631 Anemia in chronic kidney disease: Secondary | ICD-10-CM | POA: Diagnosis not present

## 2017-05-28 DIAGNOSIS — D509 Iron deficiency anemia, unspecified: Secondary | ICD-10-CM | POA: Diagnosis not present

## 2017-05-30 DIAGNOSIS — N2581 Secondary hyperparathyroidism of renal origin: Secondary | ICD-10-CM | POA: Diagnosis not present

## 2017-05-30 DIAGNOSIS — N186 End stage renal disease: Secondary | ICD-10-CM | POA: Diagnosis not present

## 2017-05-30 DIAGNOSIS — E1129 Type 2 diabetes mellitus with other diabetic kidney complication: Secondary | ICD-10-CM | POA: Diagnosis not present

## 2017-05-30 DIAGNOSIS — D631 Anemia in chronic kidney disease: Secondary | ICD-10-CM | POA: Diagnosis not present

## 2017-05-30 DIAGNOSIS — D509 Iron deficiency anemia, unspecified: Secondary | ICD-10-CM | POA: Diagnosis not present

## 2017-06-02 DIAGNOSIS — N186 End stage renal disease: Secondary | ICD-10-CM | POA: Diagnosis not present

## 2017-06-02 DIAGNOSIS — N2581 Secondary hyperparathyroidism of renal origin: Secondary | ICD-10-CM | POA: Diagnosis not present

## 2017-06-02 DIAGNOSIS — D631 Anemia in chronic kidney disease: Secondary | ICD-10-CM | POA: Diagnosis not present

## 2017-06-02 DIAGNOSIS — E1129 Type 2 diabetes mellitus with other diabetic kidney complication: Secondary | ICD-10-CM | POA: Diagnosis not present

## 2017-06-02 DIAGNOSIS — D509 Iron deficiency anemia, unspecified: Secondary | ICD-10-CM | POA: Diagnosis not present

## 2017-06-03 ENCOUNTER — Encounter: Payer: Self-pay | Admitting: Family Medicine

## 2017-06-03 ENCOUNTER — Ambulatory Visit: Payer: Medicare Other | Attending: Family Medicine | Admitting: Family Medicine

## 2017-06-03 VITALS — BP 116/65 | HR 68 | Temp 98.1°F | Ht 70.0 in | Wt 223.6 lb

## 2017-06-03 DIAGNOSIS — I25810 Atherosclerosis of coronary artery bypass graft(s) without angina pectoris: Secondary | ICD-10-CM | POA: Insufficient documentation

## 2017-06-03 DIAGNOSIS — I252 Old myocardial infarction: Secondary | ICD-10-CM | POA: Diagnosis not present

## 2017-06-03 DIAGNOSIS — E1122 Type 2 diabetes mellitus with diabetic chronic kidney disease: Secondary | ICD-10-CM | POA: Diagnosis not present

## 2017-06-03 DIAGNOSIS — I2510 Atherosclerotic heart disease of native coronary artery without angina pectoris: Secondary | ICD-10-CM

## 2017-06-03 DIAGNOSIS — M069 Rheumatoid arthritis, unspecified: Secondary | ICD-10-CM | POA: Diagnosis not present

## 2017-06-03 DIAGNOSIS — J45909 Unspecified asthma, uncomplicated: Secondary | ICD-10-CM | POA: Diagnosis not present

## 2017-06-03 DIAGNOSIS — D251 Intramural leiomyoma of uterus: Secondary | ICD-10-CM | POA: Insufficient documentation

## 2017-06-03 DIAGNOSIS — K219 Gastro-esophageal reflux disease without esophagitis: Secondary | ICD-10-CM | POA: Insufficient documentation

## 2017-06-03 DIAGNOSIS — N186 End stage renal disease: Secondary | ICD-10-CM | POA: Diagnosis not present

## 2017-06-03 DIAGNOSIS — E669 Obesity, unspecified: Secondary | ICD-10-CM | POA: Diagnosis not present

## 2017-06-03 DIAGNOSIS — E039 Hypothyroidism, unspecified: Secondary | ICD-10-CM

## 2017-06-03 DIAGNOSIS — N950 Postmenopausal bleeding: Secondary | ICD-10-CM

## 2017-06-03 DIAGNOSIS — E1140 Type 2 diabetes mellitus with diabetic neuropathy, unspecified: Secondary | ICD-10-CM | POA: Insufficient documentation

## 2017-06-03 DIAGNOSIS — I132 Hypertensive heart and chronic kidney disease with heart failure and with stage 5 chronic kidney disease, or end stage renal disease: Secondary | ICD-10-CM | POA: Diagnosis not present

## 2017-06-03 DIAGNOSIS — Z79899 Other long term (current) drug therapy: Secondary | ICD-10-CM | POA: Insufficient documentation

## 2017-06-03 DIAGNOSIS — Z7982 Long term (current) use of aspirin: Secondary | ICD-10-CM | POA: Diagnosis not present

## 2017-06-03 DIAGNOSIS — Z992 Dependence on renal dialysis: Secondary | ICD-10-CM | POA: Diagnosis not present

## 2017-06-03 DIAGNOSIS — F419 Anxiety disorder, unspecified: Secondary | ICD-10-CM

## 2017-06-03 DIAGNOSIS — Z880 Allergy status to penicillin: Secondary | ICD-10-CM | POA: Insufficient documentation

## 2017-06-03 DIAGNOSIS — E1136 Type 2 diabetes mellitus with diabetic cataract: Secondary | ICD-10-CM | POA: Diagnosis not present

## 2017-06-03 DIAGNOSIS — I509 Heart failure, unspecified: Secondary | ICD-10-CM | POA: Diagnosis not present

## 2017-06-03 DIAGNOSIS — Z9861 Coronary angioplasty status: Secondary | ICD-10-CM | POA: Diagnosis not present

## 2017-06-03 DIAGNOSIS — Z794 Long term (current) use of insulin: Secondary | ICD-10-CM | POA: Diagnosis not present

## 2017-06-03 DIAGNOSIS — M25562 Pain in left knee: Secondary | ICD-10-CM | POA: Insufficient documentation

## 2017-06-03 DIAGNOSIS — Z8673 Personal history of transient ischemic attack (TIA), and cerebral infarction without residual deficits: Secondary | ICD-10-CM | POA: Diagnosis not present

## 2017-06-03 DIAGNOSIS — M25561 Pain in right knee: Secondary | ICD-10-CM | POA: Diagnosis not present

## 2017-06-03 DIAGNOSIS — Z888 Allergy status to other drugs, medicaments and biological substances status: Secondary | ICD-10-CM | POA: Insufficient documentation

## 2017-06-03 LAB — GLUCOSE, POCT (MANUAL RESULT ENTRY): POC Glucose: 139 mg/dl — AB (ref 70–99)

## 2017-06-03 MED ORDER — BUSPIRONE HCL 10 MG PO TABS: 10.0000 mg | ORAL_TABLET | Freq: Three times a day (TID) | ORAL | 2 refills | Status: DC

## 2017-06-03 MED ORDER — ATORVASTATIN CALCIUM 40 MG PO TABS: 40.0000 mg | ORAL_TABLET | Freq: Every evening | ORAL | 1 refills | Status: DC

## 2017-06-03 MED ORDER — LEVOTHYROXINE SODIUM 25 MCG PO TABS: ORAL_TABLET | ORAL | 2 refills | Status: DC

## 2017-06-03 NOTE — Progress Notes (Signed)
Subjective:  Patient ID: Brooke Stafford, female    DOB: [birth year]  Age: 62 y.o. MRN: [id number]  CC: Diabetes   HPI CHINELO BENN is a 62 year old female with a history of type 2 diabetes mellitus (A1c 6.0), hypertension, CHF (2-D echo 35 to 40% from 2-D echo of 04/2016), CAD status post PCI ,end-stage renal disease (currently on hemodialysis on Mondays, Wednesdays and Fridays), peripheral vascular disease (status post left SFA stent placement in 2012 and 2014 and right SFA and popliteal artery angioplasty and stent placement) here for a follow-up visit.  With regards to her diabetes she complains she has had low sugars of about 78 on her current dose of NovoLog and Basaglar.  Her diabetes is managed by endocrine and she has an upcoming appointment at the end of the month. Her anxiety has improved slightly on BuSpar and hydroxyzine but she does endorse feeling anxious sometimes.  She tried Prozac 2 months ago but was unable to tolerate it.  Denies suicidal ideation or intents.  She recently had lower extremity arterial Doppler which revealed ABI of 1.08 on the right and 0.58 on the left and she denies claudication pain.  She has a follow-up with vascular in 3 months. She denies chest pains, shortness of breath. Her arthralgias have improved ever since she was placed on Plaquenil by rheumatology and informs me she was diagnosed with rheumatoid arthritis.  She complains of intermittent spotting which occur about 1 day a week for the last couple months.  Pelvic ultrasound performed had revealed a 1 cm intramural fibroid.  Pelvic ultrasound 03/13/17: IMPRESSION: 1. No cervical polyp visible by sonography. 2. Small 1 cm intramural uterine fibroid as above. 3. Normal sonographic appearance of the endometrium. 4. Nonvisualization of the ovaries.  No adnexal mass.  Past Medical History:  Diagnosis Date  . Anemia   . Anginal pain ([place])   . Anxiety   . Arthritis    "stiff fingers and  knees" (08/04/2013), (12/12/2014)  . Asthma   . CAD (coronary artery disease) 2002; 2015   CABG x 6 2002, cath 2011- med Rx stent DES VG-Diag  . CAD (coronary artery disease) of artery bypass graft; DES to VG-Diag 09/28/13 11/09/2013  . Cataract   . CHF (congestive heart failure) ([place])    "in 2002" (11/26/2012)  . Chronic bronchitis ([place])    "q year; in the winter"   . CKD (chronic kidney disease)    stage 4, followed by [facility]  . Coronary artery disease 2002   CABG x 6. Cath 5/11- med Rx  . Diabetic neuropathy ([place])   . GERD (gastroesophageal reflux disease)   . Gout    "right big toe"  . Headache    "~ q week" (08/04/2013); "~ twice/month" (12/12/2014)  . History of blood transfusion 2002   "when I had OHS"  . Hyperlipidemia   . Hypertension   . Hypothyroid    treated  . Migraines    "couple times/year" (08/04/2013), (12/12/2014)  . Myocardial infarction ([place]) 2000; 2002; 2011  . Obesity (BMI 35.0-39.9 without comorbidity)   . Peripheral vascular disease ([place]) 12/12   LSFA PTA  . Pneumonia    "3 times I think" (12/12/2014)  . Stroke [facility])    " mini stroke"  . Type II diabetes mellitus ([place])     Past Surgical History:  Procedure Laterality Date  . ABDOMINAL AORTAGRAM N/A 04/05/2011   Procedure: ABDOMINAL AORTAGRAM;  Surgeon: Lorretta Harp, MD;  Location:  [place] CATH LAB;  Service: Cardiovascular;  Laterality: N/A;  . ABDOMINAL AORTOGRAM N/A 10/30/2016   Procedure: Abdominal Aortogram;  Surgeon: Wellington Hampshire, MD;  Location: [place] CV LAB;  Service: Cardiovascular;  Laterality: N/A;  . APPENDECTOMY  1980  . AV FISTULA PLACEMENT Left 05/10/2016   Procedure: LEFT ARM ARTERIOVENOUS (AV) FISTULA CREATION;  Surgeon: Rosetta Posner, MD;  Location: [place];  Service: Vascular;  Laterality: Left;  . BASCILIC VEIN TRANSPOSITION Left 06/26/2016   Procedure: SECOND STAGE BASILIC VEIN TRANSPOSITION;  Surgeon: Rosetta Posner, MD;  Location: [place];  Service: Vascular;  Laterality:  Left;  . BREAST CYST EXCISION Right 1970's  . CARDIAC CATHETERIZATION  2002  . CARDIAC CATHETERIZATION N/A 12/12/2014   Procedure: Left Heart Cath and Cors/Grafts Angiography;  Surgeon: Peter M Martinique, MD;  Location: [place] [facility] CV LAB;  Service: Cardiovascular;  Laterality: N/A;  . CARDIAC CATHETERIZATION N/A 12/12/2014   Procedure: Coronary Stent Intervention;  Surgeon: Peter M Martinique, MD;  Location: [place] CV LAB;  Service: Cardiovascular;  Laterality: N/A;  . CATARACT EXTRACTION, BILATERAL    . CESAREAN SECTION  1978; 1980  . CHOLECYSTECTOMY  1982  . CORONARY ANGIOPLASTY WITH STENT PLACEMENT  2004; 2012   "I have 2 stents" (08/04/2013)  . CORONARY ANGIOPLASTY WITH STENT PLACEMENT  09/28/13   PTCA/ DES Xience stent to VG-Diag   . CORONARY ARTERY BYPASS GRAFT  11/20/2000   x6 LIMA to distal LAD, svg to first diag, svg to ramus intermediate branch and swquential SVG to cir marginal branch, SVG to posterior descending coronary and sequential SVG to first right posterolateral branch  . eye injections    . INSERTION OF DIALYSIS CATHETER Right 05/10/2016   Procedure: INSERTION OF DIALYSIS CATHETER - Right Internal Jugular Placement;  Surgeon: Rosetta Posner, MD;  Location: [place];  Service: Vascular;  Laterality: Right;  . LEFT HEART CATHETERIZATION WITH CORONARY/GRAFT ANGIOGRAM N/A 09/28/2013   Procedure: LEFT HEART CATHETERIZATION WITH Beatrix Fetters;  Surgeon: Peter M Martinique, MD;  Location: [facility] CATH LAB;  Service: Cardiovascular;  Laterality: N/A;  . LOWER EXTREMITY ANGIOGRAM  12/01/2012   Procedure: LOWER EXTREMITY ANGIOGRAM;  Surgeon: Lorretta Harp, MD;  Location: [facility];  Service: Cardiovascular;;  . LOWER EXTREMITY ANGIOGRAPHY Right 10/30/2016   Procedure: Lower Extremity Angiography;  Surgeon: Wellington Hampshire, MD;  Location: [place] CV LAB;  Service: Cardiovascular;  Laterality: Right;  . NM MYOCAR PERF WALL MOTION  08/27/2004   negative  . PERCUTANEOUS STENT  INTERVENTION Left 12/01/2012   Procedure: PERCUTANEOUS STENT INTERVENTION;  Surgeon: Lorretta Harp, MD;  Location: [facility] CATH LAB;  Service: Cardiovascular;  Laterality: Left;  Left SFA  . PERIPHERAL ARTERIAL STENT GRAFT Left    SFA/notes 04/07/2011 (11/30/2012)  . PERIPHERAL VASCULAR ATHERECTOMY Right 10/30/2016   Procedure: Peripheral Vascular Atherectomy;  Surgeon: Wellington Hampshire, MD;  Location: [place] CV LAB;  Service: Cardiovascular;  Laterality: Right;  cancel unable to  . PERIPHERAL VASCULAR INTERVENTION Right 10/30/2016   Procedure: Peripheral Vascular Intervention;  Surgeon: Wellington Hampshire, MD;  Location: [place] CV LAB;  Service: Cardiovascular;  Laterality: Right;  SFA  . RENAL ANGIOGRAM N/A 04/05/2011   Procedure: RENAL ANGIOGRAM;  Surgeon: Lorretta Harp, MD;  Location: [facility] CATH LAB;  Service: Cardiovascular;  Laterality: N/A;  . TUBAL LIGATION  1980    Allergies  Allergen Reactions  . Digoxin And Related Diarrhea and Other (See Comments)    TOXIC DRUG  LEVELS Patient stated she almost died. Had flu like symptoms as well as diarrhea.  . Hydralazine Shortness Of Breath  . Penicillins Cross Reactors Hives and Other (See Comments)    HIGH FEVER  Has patient had a PCN reaction causing immediate rash, facial/tongue/throat swelling, SOB or lightheadedness with hypotension: No Has patient had a PCN reaction causing SEVERE RASH INVOLVING MUCUS MEMBRANES or SKIN NECROSIS  #  #  #  YES  #  #  #  Has patient had a PCN reaction that required hospitalization: Unk Has patient had a PCN reaction occurring within the last 10 years: Unk If all of the above answers are "NO", then may proceed with Cephalosporin use.   [name] Lisinopril Other (See Comments)    Felt like she had the flu. Was very sick!!!  . Adhesive [Tape] Rash and Other (See Comments)    bruising     Outpatient Medications Prior to Visit  Medication Sig Dispense Refill  . allopurinol (ZYLOPRIM) 300 MG tablet Take  1 tablet (300 mg total) daily by mouth. 90 tablet 1  . aspirin 81 MG chewable tablet Chew 1 tablet (81 mg total) by mouth daily. 30 tablet 10  . cetirizine (ZYRTEC) 10 MG tablet Take 10 mg by mouth daily.    . clopidogrel (PLAVIX) 75 MG tablet Take 1 tablet (75 mg total) daily by mouth. 90 tablet 1  . fluticasone (FLONASE) 50 MCG/ACT nasal spray Place 2 sprays daily as needed into both nostrils for allergies or rhinitis. 16 g 3  . gabapentin (NEURONTIN) 400 MG capsule Take 1 capsule (400 mg total) by mouth at bedtime. 30 capsule 5  . hydrOXYzine (ATARAX/VISTARIL) 25 MG tablet Take 0.5 tablets (12.5 mg total) by mouth daily as needed. 30 tablet 0  . insulin aspart (NOVOLOG FLEXPEN) 100 UNIT/ML FlexPen Inject 25 units before breakfast and 20 units before lunch and dinner 70 pen 0  . Insulin Glargine (BASAGLAR KWIKPEN) 100 UNIT/ML SOPN INJECT 23 UNITS INTO THE SKIN AT BEDTIME 45 mL 3  . isosorbide mononitrate (IMDUR) 60 MG 24 hr tablet take 2 tablets by mouth once daily 180 tablet 1  . metoprolol tartrate (LOPRESSOR) 25 MG tablet Take 0.5 tablets (12.5 mg total) by mouth 2 (two) times daily. 90 tablet 3  . nitroGLYCERIN (NITROSTAT) 0.4 MG SL tablet Place 1 tablet (0.4 mg total) under the tongue every 5 (five) minutes as needed. (Patient taking differently: Place 0.4 mg under the tongue every 5 (five) minutes as needed for chest pain. ) 30 tablet 2  . pantoprazole (PROTONIX) 40 MG tablet Take 1 tablet (40 mg total) by mouth daily. 30 tablet 5  . sevelamer (RENAGEL) 800 MG tablet Take 2,400 mg by mouth 3 (three) times daily with meals.    . traMADol (ULTRAM) 50 MG tablet Take 1 tablet (50 mg total) by mouth every 12 (twelve) hours as needed. 40 tablet 1  . atorvastatin (LIPITOR) 40 MG tablet Take 1 tablet (40 mg total) every evening by mouth. 90 tablet 1  . busPIRone (BUSPAR) 7.5 MG tablet Take 1 tablet (7.5 mg total) by mouth 3 (three) times daily. 90 tablet 2  . levothyroxine (SYNTHROID, LEVOTHROID) 25  MCG tablet take 1 tablet by mouth every morning before BREAKFAST 30 tablet 2  . predniSONE (DELTASONE) 20 MG tablet Take 1 tablet (20 mg total) by mouth daily with breakfast. 5 tablet 0   No facility-administered medications prior to visit.     ROS Review of Systems  Constitutional: Negative for activity change, appetite change and fatigue.  HENT: Negative for congestion, sinus pressure and sore throat.   Eyes: Negative for visual disturbance.  Respiratory: Negative for cough, chest tightness, shortness of breath and wheezing.   Cardiovascular: Negative for chest pain and palpitations.  Gastrointestinal: Negative for abdominal distention, abdominal pain and constipation.  Endocrine: Negative for polydipsia.  Genitourinary: Positive for menstrual problem. Negative for dysuria and frequency.  Musculoskeletal:       See hpi  Skin: Negative for rash.  Neurological: Negative for tremors, light-headedness and numbness.  Hematological: Does not bruise/bleed easily.  Psychiatric/Behavioral: Negative for agitation and behavioral problems.    Objective:  BP 116/65   Pulse 68   Temp 98.1 F (36.7 C) (Oral)   Ht 5\' 10"  (1.778 m)   Wt 223 lb 9.6 oz (101.4 kg)   LMP 10/13/2014 (Exact Date)   SpO2 97%   BMI 32.08 kg/m   BP/Weight 06/03/2017 04/17/2017 80/99/8338  Systolic BP 250 539 767  Diastolic BP 65 74 69  Wt. (Lbs) 223.6 223.2 221.2  BMI 32.08 32.03 31.74      Physical Exam  Constitutional: She is oriented to person, place, and time. She appears well-developed and well-nourished.  Cardiovascular: Normal rate, normal heart sounds and intact distal pulses.  No murmur heard. Pulmonary/Chest: Effort normal and breath sounds normal. She has no wheezes. She has no rales. She exhibits no tenderness.  Abdominal: Soft. Bowel sounds are normal. She exhibits no distension and no mass. There is no tenderness.  Musculoskeletal: Normal range of motion.  Left arm AV fistula with palpable  thrill  Neurological: She is alert and oriented to person, place, and time.  Skin: Skin is warm and dry.  Psychiatric: She has a normal mood and affect.    Lab Results  Component Value Date   TSH 1.320 02/25/2017     Lab Results  Component Value Date   HGBA1C 6.0 02/25/2017    Assessment & Plan:   1. Type 2 diabetes mellitus with ESRD (end-stage renal disease) ([place]) Controlled with A1c of 6.0 She has experienced hypoglycemic episodes and has been educated on management Advised to reduce long-acting insulin and short-acting insulin by 2 units each Follow-up with endocrinology - POCT glucose (manual entry)  2. Anxiety Uncontrolled current dose Increased dose of BuSpar Continue hydroxyzine Previously unable to tolerate SSRI - busPIRone (BUSPAR) 10 MG tablet; Take 1 tablet (10 mg total) by mouth 3 (three) times daily.  Dispense: 90 tablet; Refill: 2  3. Post-menopausal bleeding Pelvic ultrasound revealed 1 cm intramural uterine fibroid In the light of postmenopausal bleeding fibroid, I will refer to GYN - Ambulatory referral to Gynecology  4. CAD -S/P PCI June 2015 and 12/14/14 Stable, no angina Risk factor modifications  5. Hypothyroidism, acquired Controlled Continue levothyroxine  6. Arthralgia of both knees Generalized arthralgias Recently seen by rheumatology, ? RA Continue Plaquenil   Meds ordered this encounter  Medications  . busPIRone (BUSPAR) 10 MG tablet    Sig: Take 1 tablet (10 mg total) by mouth 3 (three) times daily.    Dispense:  90 tablet    Refill:  2    Discontinue Previous dose  . levothyroxine (SYNTHROID, LEVOTHROID) 25 MCG tablet    Sig: take 1 tablet by mouth every morning before BREAKFAST    Dispense:  30 tablet    Refill:  2  . atorvastatin (LIPITOR) 40 MG tablet    Sig: Take 1 tablet (40 mg total) by  mouth every evening.    Dispense:  90 tablet    Refill:  1    Follow-up: Return in about 3 months (around 08/31/2017) for  Follow-up of chronic medical conditions.   Charlott Rakes MD

## 2017-06-03 NOTE — Patient Instructions (Signed)
Postmenopausal Bleeding Postmenopausal bleeding is any bleeding after menopause. Menopause is when a woman's period stops. Any type of bleeding after menopause is concerning. It should be checked by your doctor. Any treatment will depend on the cause. Follow these instructions at home: Watch your condition for any changes.  Avoid the use of tampons and douches as told by your doctor.  Change your pads often.  Get regular pelvic exams and Pap tests.  Keep all appointments for tests as told by your doctor.  Contact a doctor if:  Your bleeding lasts for more than 1 week.  You have belly (abdominal) pain.  You have bleeding after sex (intercourse). Get help right away if:  You have a fever, chills, a headache, dizziness, muscle aches, and bleeding.  You have strong pain with bleeding.  You have clumps of blood (blood clots) coming from your vagina.  You have bleeding and need more than 1 pad an hour.  You feel like you are going to pass out (faint). This information is not intended to replace advice given to you by your health care provider. Make sure you discuss any questions you have with your health care provider. Document Released: 01/09/2008 Document Revised: 09/07/2015 Document Reviewed: 10/29/2012 Elsevier Interactive Patient Education  2017 Elsevier Inc.  

## 2017-06-04 ENCOUNTER — Encounter: Payer: Self-pay | Admitting: Family Medicine

## 2017-06-04 DIAGNOSIS — N186 End stage renal disease: Secondary | ICD-10-CM | POA: Diagnosis not present

## 2017-06-04 DIAGNOSIS — D509 Iron deficiency anemia, unspecified: Secondary | ICD-10-CM | POA: Diagnosis not present

## 2017-06-04 DIAGNOSIS — E1129 Type 2 diabetes mellitus with other diabetic kidney complication: Secondary | ICD-10-CM | POA: Diagnosis not present

## 2017-06-04 DIAGNOSIS — D631 Anemia in chronic kidney disease: Secondary | ICD-10-CM | POA: Diagnosis not present

## 2017-06-04 DIAGNOSIS — N2581 Secondary hyperparathyroidism of renal origin: Secondary | ICD-10-CM | POA: Diagnosis not present

## 2017-06-05 DIAGNOSIS — E113493 Type 2 diabetes mellitus with severe nonproliferative diabetic retinopathy without macular edema, bilateral: Secondary | ICD-10-CM | POA: Diagnosis not present

## 2017-06-05 DIAGNOSIS — E113411 Type 2 diabetes mellitus with severe nonproliferative diabetic retinopathy with macular edema, right eye: Secondary | ICD-10-CM | POA: Diagnosis not present

## 2017-06-05 DIAGNOSIS — H359 Unspecified retinal disorder: Secondary | ICD-10-CM | POA: Diagnosis not present

## 2017-06-05 DIAGNOSIS — H3561 Retinal hemorrhage, right eye: Secondary | ICD-10-CM | POA: Diagnosis not present

## 2017-06-06 DIAGNOSIS — D631 Anemia in chronic kidney disease: Secondary | ICD-10-CM | POA: Diagnosis not present

## 2017-06-06 DIAGNOSIS — N2581 Secondary hyperparathyroidism of renal origin: Secondary | ICD-10-CM | POA: Diagnosis not present

## 2017-06-06 DIAGNOSIS — D509 Iron deficiency anemia, unspecified: Secondary | ICD-10-CM | POA: Diagnosis not present

## 2017-06-06 DIAGNOSIS — E1129 Type 2 diabetes mellitus with other diabetic kidney complication: Secondary | ICD-10-CM | POA: Diagnosis not present

## 2017-06-06 DIAGNOSIS — N186 End stage renal disease: Secondary | ICD-10-CM | POA: Diagnosis not present

## 2017-06-09 DIAGNOSIS — E1129 Type 2 diabetes mellitus with other diabetic kidney complication: Secondary | ICD-10-CM | POA: Diagnosis not present

## 2017-06-09 DIAGNOSIS — D631 Anemia in chronic kidney disease: Secondary | ICD-10-CM | POA: Diagnosis not present

## 2017-06-09 DIAGNOSIS — N186 End stage renal disease: Secondary | ICD-10-CM | POA: Diagnosis not present

## 2017-06-09 DIAGNOSIS — N2581 Secondary hyperparathyroidism of renal origin: Secondary | ICD-10-CM | POA: Diagnosis not present

## 2017-06-09 DIAGNOSIS — D509 Iron deficiency anemia, unspecified: Secondary | ICD-10-CM | POA: Diagnosis not present

## 2017-06-10 ENCOUNTER — Ambulatory Visit (INDEPENDENT_AMBULATORY_CARE_PROVIDER_SITE_OTHER): Payer: Medicare Other | Admitting: Sports Medicine

## 2017-06-10 ENCOUNTER — Encounter: Payer: Self-pay | Admitting: Sports Medicine

## 2017-06-10 DIAGNOSIS — B351 Tinea unguium: Secondary | ICD-10-CM | POA: Diagnosis not present

## 2017-06-10 DIAGNOSIS — E1142 Type 2 diabetes mellitus with diabetic polyneuropathy: Secondary | ICD-10-CM

## 2017-06-10 DIAGNOSIS — M79674 Pain in right toe(s): Secondary | ICD-10-CM

## 2017-06-10 DIAGNOSIS — M79675 Pain in left toe(s): Secondary | ICD-10-CM | POA: Diagnosis not present

## 2017-06-10 DIAGNOSIS — I739 Peripheral vascular disease, unspecified: Secondary | ICD-10-CM

## 2017-06-10 NOTE — Progress Notes (Signed)
Subjective: Brooke Stafford is a 62 y.o. female patient seen in office for follow up nail care. Reports that she is getting still pain in toes and but cushions help. Patient has no other pedal complaints at this time.  FBS this AM not recorded.  Patient is also a dialysis patient and is on Plavix. Per patient got a good report from vascular earlier this month.   Patient Active Problem List   Diagnosis Date Noted  . Diabetic neuropathy ([place]) 04/17/2017  . GERD (gastroesophageal reflux disease) 04/17/2017  . Foot ulcer, right ([place]) 11/28/2016  . Cervical polyp 11/12/2016  . AV (arteriovenous fistula) ([place]) 06/17/2016  . Mixed hyperlipidemia 05/21/2016  . ESRD (end stage renal disease) ([place])   . Hypokalemia 05/03/2016  . COPD exacerbation ([place]) 05/03/2016  . COPD (chronic obstructive pulmonary disease) ([place]) 04/11/2016  . CAP (community acquired pneumonia) 03/26/2016  . Acute respiratory failure with hypoxia ([place]) 03/26/2016  . Acute on chronic combined systolic and diastolic CHF (congestive heart failure) ([place]) 03/26/2016  . Medication management 03/21/2016  . Anemia of chronic disease 12/27/2015  . Acute on chronic systolic CHF (congestive heart failure) ([place])   . Acute CHF ([place]) 12/18/2015  . Hypertensive heart and renal disease with CHF and ESRD ([place]) 12/18/2015  . Constipation 12/18/2015  . Adjustment disorder with anxious mood 11/02/2015  . Posterior circulation stroke ([place]) 11/02/2015  . Abnormality of gait 10/23/2015  . Depression 09/28/2015  . Diabetic retinopathy ([place]) 09/13/2015  . Grief reaction 08/24/2015  . Creatinine elevation 07/25/2015  . Chronic combined systolic and diastolic congestive heart failure ([place]) 07/25/2015  . Rash of hands 02/23/2015  . Essential hypertension   . Angina pectoris ([place]) 12/12/2014  . Abnormal nuclear stress test   . Acute combined systolic and diastolic heart failure ([place]) 12/02/2014  . Seasonal allergies 08/11/2014  . Gout  08/11/2014  . Encounter for screening mammogram for breast cancer 05/16/2014  . Screening for colon cancer 05/16/2014  . Type 2 diabetes mellitus with ESRD (end-stage renal disease) ([place]) 01/27/2014  . Atopic eczema 01/27/2014  . Precordial pain, atypical, negative MI, Musculature Skeletal pain  11/08/2013  . CKD (chronic kidney disease) stage 5, GFR less than 15 ml/min (H[facility]) 11/08/2013  . Unstable angina ([place]) 09/28/2013  . Ischemic cardiomyopathy- new drop in EF 08/30/2013  . Chest pain 08/04/2013  . CAD -S/P PCI June 2015 and 12/14/14 02/01/2013  . Hypothyroidism, acquired 02/01/2013  . PVD, LSFA PTA 12/12 04/06/2011  . Hx of CABG x 6 2002 04/06/2011  . Dyslipidemia 04/06/2011   Current Outpatient Medications on File Prior to Visit  Medication Sig Dispense Refill  . allopurinol (ZYLOPRIM) 300 MG tablet Take 1 tablet (300 mg total) daily by mouth. 90 tablet 1  . aspirin 81 MG chewable tablet Chew 1 tablet (81 mg total) by mouth daily. 30 tablet 10  . atorvastatin (LIPITOR) 40 MG tablet Take 1 tablet (40 mg total) by mouth every evening. 90 tablet 1  . busPIRone (BUSPAR) 10 MG tablet Take 1 tablet (10 mg total) by mouth 3 (three) times daily. 90 tablet 2  . cetirizine (ZYRTEC) 10 MG tablet Take 10 mg by mouth daily.    . clopidogrel (PLAVIX) 75 MG tablet Take 1 tablet (75 mg total) daily by mouth. 90 tablet 1  . fluticasone (FLONASE) 50 MCG/ACT nasal spray Place 2 sprays daily as needed into both nostrils for allergies or rhinitis. 16 g 3  . gabapentin (NEURONTIN) 400 MG capsule Take 1 capsule (  400 mg total) by mouth at bedtime. 30 capsule 5  . hydrOXYzine (ATARAX/VISTARIL) 25 MG tablet Take 0.5 tablets (12.5 mg total) by mouth daily as needed. 30 tablet 0  . insulin aspart (NOVOLOG FLEXPEN) 100 UNIT/ML FlexPen Inject 25 units before breakfast and 20 units before lunch and dinner 70 pen 0  . Insulin Glargine (BASAGLAR KWIKPEN) 100 UNIT/ML SOPN INJECT 23 UNITS INTO THE SKIN AT BEDTIME 45  mL 3  . isosorbide mononitrate (IMDUR) 60 MG 24 hr tablet take 2 tablets by mouth once daily 180 tablet 1  . levothyroxine (SYNTHROID, LEVOTHROID) 25 MCG tablet take 1 tablet by mouth every morning before BREAKFAST 30 tablet 2  . metoprolol tartrate (LOPRESSOR) 25 MG tablet Take 0.5 tablets (12.5 mg total) by mouth 2 (two) times daily. 90 tablet 3  . nitroGLYCERIN (NITROSTAT) 0.4 MG SL tablet Place 1 tablet (0.4 mg total) under the tongue every 5 (five) minutes as needed. (Patient taking differently: Place 0.4 mg under the tongue every 5 (five) minutes as needed for chest pain. ) 30 tablet 2  . pantoprazole (PROTONIX) 40 MG tablet Take 1 tablet (40 mg total) by mouth daily. 30 tablet 5  . sevelamer (RENAGEL) 800 MG tablet Take 2,400 mg by mouth 3 (three) times daily with meals.    . traMADol (ULTRAM) 50 MG tablet Take 1 tablet (50 mg total) by mouth every 12 (twelve) hours as needed. 40 tablet 1   No current facility-administered medications on file prior to visit.    Allergies  Allergen Reactions  . Digoxin And Related Diarrhea and Other (See Comments)    TOXIC DRUG LEVELS Patient stated she almost died. Had flu like symptoms as well as diarrhea.  . Hydralazine Shortness Of Breath  . Penicillins Cross Reactors Hives and Other (See Comments)    HIGH FEVER  Has patient had a PCN reaction causing immediate rash, facial/tongue/throat swelling, SOB or lightheadedness with hypotension: No Has patient had a PCN reaction causing SEVERE RASH INVOLVING MUCUS MEMBRANES or SKIN NECROSIS  #  #  #  YES  #  #  #  Has patient had a PCN reaction that required hospitalization: Unk Has patient had a PCN reaction occurring within the last 10 years: Unk If all of the above answers are "NO", then may proceed with Cephalosporin use.   [name] Lisinopril Other (See Comments)    Felt like she had the flu. Was very sick!!!  . Adhesive [Tape] Rash and Other (See Comments)    bruising   Objective: There were no  vitals filed for this visit.   General: Patient is awake, alert, oriented x 3 and in no acute distress.  Dermatology: Skin is warm and dry bilateral with a continued healed ulceration  at right great toe. Ulceration remains resolved. Minimal reactive callus to toe. No other acute signs of infection. Nails are elongated and mycotic.    Vascular: Dorsalis Pedis pulse = 1/4 Bilateral,  Posterior Tibial pulse = 0/4 Bilateral,  Capillary Fill Time < 5 seconds  Neurologic: Protective sensation absent bilateral.  Musculosketal:  No pain with palpation to previous ulcerated area. No pain with compression to calves bilateral. Asymptomatic hammertoe bony deformities noted bilateral.  No results for input(s): GRAMSTAIN, LABORGA in the last 8760 hours.  Assessment and Plan:  Problem List Items Addressed This Visit      Endocrine   Diabetic neuropathy ([place])    Other Visit Diagnoses    Pain due to onychomycosis of toenails of  both feet    -  Primary   PAD (peripheral artery disease) ([place])         -Examined patient and re-discussed the progression of the healed wound and treatment alternatives. - Previous ulcer remains healed at right 1st toe. Advised silicone toe cap; dispensed new cap this visit  -Advised patient to continue with shoes that fit appropriately to prevent rubbing to toes/re-ulceration  -Nails x 10 debrided using sterile nail nipper without incident -Patient to return in 9-10 weeks for routine foot care or sooner if problems arise.  Landis Martins, DPM

## 2017-06-11 DIAGNOSIS — D509 Iron deficiency anemia, unspecified: Secondary | ICD-10-CM | POA: Diagnosis not present

## 2017-06-11 DIAGNOSIS — D631 Anemia in chronic kidney disease: Secondary | ICD-10-CM | POA: Diagnosis not present

## 2017-06-11 DIAGNOSIS — E1129 Type 2 diabetes mellitus with other diabetic kidney complication: Secondary | ICD-10-CM | POA: Diagnosis not present

## 2017-06-11 DIAGNOSIS — N186 End stage renal disease: Secondary | ICD-10-CM | POA: Diagnosis not present

## 2017-06-11 DIAGNOSIS — N2581 Secondary hyperparathyroidism of renal origin: Secondary | ICD-10-CM | POA: Diagnosis not present

## 2017-06-12 ENCOUNTER — Encounter: Payer: Self-pay | Admitting: Internal Medicine

## 2017-06-12 ENCOUNTER — Ambulatory Visit (INDEPENDENT_AMBULATORY_CARE_PROVIDER_SITE_OTHER): Payer: Medicare Other | Admitting: Internal Medicine

## 2017-06-12 VITALS — BP 122/70 | HR 79 | Temp 98.3°F | Resp 16 | Ht 70.0 in | Wt 223.5 lb

## 2017-06-12 DIAGNOSIS — N186 End stage renal disease: Secondary | ICD-10-CM | POA: Diagnosis not present

## 2017-06-12 DIAGNOSIS — E1122 Type 2 diabetes mellitus with diabetic chronic kidney disease: Secondary | ICD-10-CM | POA: Diagnosis not present

## 2017-06-12 DIAGNOSIS — I2510 Atherosclerotic heart disease of native coronary artery without angina pectoris: Secondary | ICD-10-CM | POA: Diagnosis not present

## 2017-06-12 DIAGNOSIS — E039 Hypothyroidism, unspecified: Secondary | ICD-10-CM

## 2017-06-12 DIAGNOSIS — E785 Hyperlipidemia, unspecified: Secondary | ICD-10-CM | POA: Diagnosis not present

## 2017-06-12 DIAGNOSIS — Z9861 Coronary angioplasty status: Secondary | ICD-10-CM | POA: Diagnosis not present

## 2017-06-12 LAB — POCT GLYCOSYLATED HEMOGLOBIN (HGB A1C): HEMOGLOBIN A1C: 5.6

## 2017-06-12 MED ORDER — INSULIN ASPART 100 UNIT/ML FLEXPEN: PEN_INJECTOR | SUBCUTANEOUS | 3 refills | Status: DC

## 2017-06-12 MED ORDER — BASAGLAR KWIKPEN 100 UNIT/ML ~~LOC~~ SOPN: PEN_INJECTOR | SUBCUTANEOUS | 3 refills | Status: DC
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
## 2017-06-12 NOTE — Patient Instructions (Addendum)
Please decrease: - Basaglar 20 units at bedtime - Novolog   Before b'fast: 15 units  Before lunch: 10 units  Before dinner: 10 units If sugars before a meal are 80-150: take the whole amount of mealtime insulin with that meal, but not the Sliding scale If sugars are 60-80, take only half of the mealtime insulin and no sliding scale. If sugars <60, do not take any insulin with that meal.  Please return in 3 months with your sugar log.

## 2017-06-13 DIAGNOSIS — I129 Hypertensive chronic kidney disease with stage 1 through stage 4 chronic kidney disease, or unspecified chronic kidney disease: Secondary | ICD-10-CM | POA: Diagnosis not present

## 2017-06-13 DIAGNOSIS — E1129 Type 2 diabetes mellitus with other diabetic kidney complication: Secondary | ICD-10-CM | POA: Diagnosis not present

## 2017-06-13 DIAGNOSIS — D631 Anemia in chronic kidney disease: Secondary | ICD-10-CM | POA: Diagnosis not present

## 2017-06-13 DIAGNOSIS — N2581 Secondary hyperparathyroidism of renal origin: Secondary | ICD-10-CM | POA: Diagnosis not present

## 2017-06-13 DIAGNOSIS — Z23 Encounter for immunization: Secondary | ICD-10-CM | POA: Diagnosis not present

## 2017-06-13 DIAGNOSIS — Z992 Dependence on renal dialysis: Secondary | ICD-10-CM | POA: Diagnosis not present

## 2017-06-13 DIAGNOSIS — N186 End stage renal disease: Secondary | ICD-10-CM | POA: Diagnosis not present

## 2017-06-13 DIAGNOSIS — D509 Iron deficiency anemia, unspecified: Secondary | ICD-10-CM | POA: Diagnosis not present

## 2017-06-16 DIAGNOSIS — E1129 Type 2 diabetes mellitus with other diabetic kidney complication: Secondary | ICD-10-CM | POA: Diagnosis not present

## 2017-06-16 DIAGNOSIS — Z23 Encounter for immunization: Secondary | ICD-10-CM | POA: Diagnosis not present

## 2017-06-16 DIAGNOSIS — D631 Anemia in chronic kidney disease: Secondary | ICD-10-CM | POA: Diagnosis not present

## 2017-06-16 DIAGNOSIS — N2581 Secondary hyperparathyroidism of renal origin: Secondary | ICD-10-CM | POA: Diagnosis not present

## 2017-06-16 DIAGNOSIS — N186 End stage renal disease: Secondary | ICD-10-CM | POA: Diagnosis not present

## 2017-06-16 DIAGNOSIS — D509 Iron deficiency anemia, unspecified: Secondary | ICD-10-CM | POA: Diagnosis not present

## 2017-06-17 ENCOUNTER — Encounter: Payer: Self-pay | Admitting: Cardiovascular Disease

## 2017-06-17 ENCOUNTER — Ambulatory Visit (INDEPENDENT_AMBULATORY_CARE_PROVIDER_SITE_OTHER): Payer: Medicare Other | Admitting: Cardiovascular Disease

## 2017-06-17 VITALS — BP 111/57 | HR 74 | Ht 70.5 in | Wt 222.0 lb

## 2017-06-17 DIAGNOSIS — I25718 Atherosclerosis of autologous vein coronary artery bypass graft(s) with other forms of angina pectoris: Secondary | ICD-10-CM

## 2017-06-17 DIAGNOSIS — E118 Type 2 diabetes mellitus with unspecified complications: Secondary | ICD-10-CM

## 2017-06-17 DIAGNOSIS — I5042 Chronic combined systolic (congestive) and diastolic (congestive) heart failure: Secondary | ICD-10-CM

## 2017-06-17 DIAGNOSIS — N186 End stage renal disease: Secondary | ICD-10-CM | POA: Diagnosis not present

## 2017-06-17 DIAGNOSIS — Z794 Long term (current) use of insulin: Secondary | ICD-10-CM

## 2017-06-17 DIAGNOSIS — I739 Peripheral vascular disease, unspecified: Secondary | ICD-10-CM

## 2017-06-17 DIAGNOSIS — E7800 Pure hypercholesterolemia, unspecified: Secondary | ICD-10-CM | POA: Diagnosis not present

## 2017-06-17 DIAGNOSIS — Z9861 Coronary angioplasty status: Secondary | ICD-10-CM

## 2017-06-17 DIAGNOSIS — I2510 Atherosclerotic heart disease of native coronary artery without angina pectoris: Secondary | ICD-10-CM

## 2017-06-17 MED ORDER — ATORVASTATIN CALCIUM 40 MG PO TABS: 40.0000 mg | ORAL_TABLET | Freq: Every evening | ORAL | 3 refills | Status: DC
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
## 2017-06-17 NOTE — Patient Instructions (Signed)
Medication Instructions:  Your physician recommends that you continue on your current medications as directed. Please refer to the Current Medication list given to you today.  Labwork: none  Testing/Procedures: None   Follow-Up: Your physician wants you to follow-up in: 6 months with Dr Sallyanne Kuster. You will receive a reminder letter in the mail two months in advance. If you don't receive a letter, please call our office to schedule the follow-up appointment.   Any Other Special Instructions Will Be Listed Below (If Applicable).     If you need a refill on your cardiac medications before your next appointment, please call your pharmacy.

## 2017-06-18 DIAGNOSIS — E7800 Pure hypercholesterolemia, unspecified: Secondary | ICD-10-CM | POA: Insufficient documentation

## 2017-06-18 DIAGNOSIS — Z23 Encounter for immunization: Secondary | ICD-10-CM | POA: Diagnosis not present

## 2017-06-18 DIAGNOSIS — I25718 Atherosclerosis of autologous vein coronary artery bypass graft(s) with other forms of angina pectoris: Secondary | ICD-10-CM | POA: Insufficient documentation

## 2017-06-18 DIAGNOSIS — D509 Iron deficiency anemia, unspecified: Secondary | ICD-10-CM | POA: Diagnosis not present

## 2017-06-18 DIAGNOSIS — D631 Anemia in chronic kidney disease: Secondary | ICD-10-CM | POA: Diagnosis not present

## 2017-06-18 DIAGNOSIS — N2581 Secondary hyperparathyroidism of renal origin: Secondary | ICD-10-CM | POA: Diagnosis not present

## 2017-06-18 DIAGNOSIS — E1129 Type 2 diabetes mellitus with other diabetic kidney complication: Secondary | ICD-10-CM | POA: Diagnosis not present

## 2017-06-18 DIAGNOSIS — N186 End stage renal disease: Secondary | ICD-10-CM | POA: Diagnosis not present

## 2017-06-19 ENCOUNTER — Other Ambulatory Visit: Payer: Self-pay | Admitting: Internal Medicine

## 2017-06-20 DIAGNOSIS — N2581 Secondary hyperparathyroidism of renal origin: Secondary | ICD-10-CM | POA: Diagnosis not present

## 2017-06-20 DIAGNOSIS — D509 Iron deficiency anemia, unspecified: Secondary | ICD-10-CM | POA: Diagnosis not present

## 2017-06-20 DIAGNOSIS — E1129 Type 2 diabetes mellitus with other diabetic kidney complication: Secondary | ICD-10-CM | POA: Diagnosis not present

## 2017-06-20 DIAGNOSIS — Z23 Encounter for immunization: Secondary | ICD-10-CM | POA: Diagnosis not present

## 2017-06-20 DIAGNOSIS — N186 End stage renal disease: Secondary | ICD-10-CM | POA: Diagnosis not present

## 2017-06-20 DIAGNOSIS — D631 Anemia in chronic kidney disease: Secondary | ICD-10-CM | POA: Diagnosis not present

## 2017-06-23 DIAGNOSIS — N2581 Secondary hyperparathyroidism of renal origin: Secondary | ICD-10-CM | POA: Diagnosis not present

## 2017-06-23 DIAGNOSIS — Z23 Encounter for immunization: Secondary | ICD-10-CM | POA: Diagnosis not present

## 2017-06-23 DIAGNOSIS — D631 Anemia in chronic kidney disease: Secondary | ICD-10-CM | POA: Diagnosis not present

## 2017-06-23 DIAGNOSIS — E1129 Type 2 diabetes mellitus with other diabetic kidney complication: Secondary | ICD-10-CM | POA: Diagnosis not present

## 2017-06-23 DIAGNOSIS — N186 End stage renal disease: Secondary | ICD-10-CM | POA: Diagnosis not present

## 2017-06-23 DIAGNOSIS — D509 Iron deficiency anemia, unspecified: Secondary | ICD-10-CM | POA: Diagnosis not present

## 2017-06-25 DIAGNOSIS — N2581 Secondary hyperparathyroidism of renal origin: Secondary | ICD-10-CM | POA: Diagnosis not present

## 2017-06-25 DIAGNOSIS — D631 Anemia in chronic kidney disease: Secondary | ICD-10-CM | POA: Diagnosis not present

## 2017-06-25 DIAGNOSIS — D509 Iron deficiency anemia, unspecified: Secondary | ICD-10-CM | POA: Diagnosis not present

## 2017-06-25 DIAGNOSIS — E1129 Type 2 diabetes mellitus with other diabetic kidney complication: Secondary | ICD-10-CM | POA: Diagnosis not present

## 2017-06-25 DIAGNOSIS — Z23 Encounter for immunization: Secondary | ICD-10-CM | POA: Diagnosis not present

## 2017-06-25 DIAGNOSIS — N186 End stage renal disease: Secondary | ICD-10-CM | POA: Diagnosis not present

## 2017-06-27 DIAGNOSIS — N186 End stage renal disease: Secondary | ICD-10-CM | POA: Diagnosis not present

## 2017-06-27 DIAGNOSIS — Z23 Encounter for immunization: Secondary | ICD-10-CM | POA: Diagnosis not present

## 2017-06-27 DIAGNOSIS — N2581 Secondary hyperparathyroidism of renal origin: Secondary | ICD-10-CM | POA: Diagnosis not present

## 2017-06-27 DIAGNOSIS — D509 Iron deficiency anemia, unspecified: Secondary | ICD-10-CM | POA: Diagnosis not present

## 2017-06-27 DIAGNOSIS — E1129 Type 2 diabetes mellitus with other diabetic kidney complication: Secondary | ICD-10-CM | POA: Diagnosis not present

## 2017-06-27 DIAGNOSIS — D631 Anemia in chronic kidney disease: Secondary | ICD-10-CM | POA: Diagnosis not present

## 2017-06-30 DIAGNOSIS — D509 Iron deficiency anemia, unspecified: Secondary | ICD-10-CM | POA: Diagnosis not present

## 2017-06-30 DIAGNOSIS — Z23 Encounter for immunization: Secondary | ICD-10-CM | POA: Diagnosis not present

## 2017-06-30 DIAGNOSIS — N2581 Secondary hyperparathyroidism of renal origin: Secondary | ICD-10-CM | POA: Diagnosis not present

## 2017-06-30 DIAGNOSIS — D631 Anemia in chronic kidney disease: Secondary | ICD-10-CM | POA: Diagnosis not present

## 2017-06-30 DIAGNOSIS — N186 End stage renal disease: Secondary | ICD-10-CM | POA: Diagnosis not present

## 2017-06-30 DIAGNOSIS — E1129 Type 2 diabetes mellitus with other diabetic kidney complication: Secondary | ICD-10-CM | POA: Diagnosis not present

## 2017-07-02 DIAGNOSIS — N186 End stage renal disease: Secondary | ICD-10-CM | POA: Diagnosis not present

## 2017-07-02 DIAGNOSIS — Z23 Encounter for immunization: Secondary | ICD-10-CM | POA: Diagnosis not present

## 2017-07-02 DIAGNOSIS — N2581 Secondary hyperparathyroidism of renal origin: Secondary | ICD-10-CM | POA: Diagnosis not present

## 2017-07-02 DIAGNOSIS — D509 Iron deficiency anemia, unspecified: Secondary | ICD-10-CM | POA: Diagnosis not present

## 2017-07-02 DIAGNOSIS — D631 Anemia in chronic kidney disease: Secondary | ICD-10-CM | POA: Diagnosis not present

## 2017-07-02 DIAGNOSIS — E1129 Type 2 diabetes mellitus with other diabetic kidney complication: Secondary | ICD-10-CM | POA: Diagnosis not present

## 2017-07-04 DIAGNOSIS — E1129 Type 2 diabetes mellitus with other diabetic kidney complication: Secondary | ICD-10-CM | POA: Diagnosis not present

## 2017-07-04 DIAGNOSIS — D631 Anemia in chronic kidney disease: Secondary | ICD-10-CM | POA: Diagnosis not present

## 2017-07-04 DIAGNOSIS — D509 Iron deficiency anemia, unspecified: Secondary | ICD-10-CM | POA: Diagnosis not present

## 2017-07-04 DIAGNOSIS — Z23 Encounter for immunization: Secondary | ICD-10-CM | POA: Diagnosis not present

## 2017-07-04 DIAGNOSIS — N186 End stage renal disease: Secondary | ICD-10-CM | POA: Diagnosis not present

## 2017-07-04 DIAGNOSIS — N2581 Secondary hyperparathyroidism of renal origin: Secondary | ICD-10-CM | POA: Diagnosis not present

## 2017-07-07 DIAGNOSIS — D509 Iron deficiency anemia, unspecified: Secondary | ICD-10-CM | POA: Diagnosis not present

## 2017-07-07 DIAGNOSIS — N186 End stage renal disease: Secondary | ICD-10-CM | POA: Diagnosis not present

## 2017-07-07 DIAGNOSIS — Z23 Encounter for immunization: Secondary | ICD-10-CM | POA: Diagnosis not present

## 2017-07-07 DIAGNOSIS — E1129 Type 2 diabetes mellitus with other diabetic kidney complication: Secondary | ICD-10-CM | POA: Diagnosis not present

## 2017-07-07 DIAGNOSIS — D631 Anemia in chronic kidney disease: Secondary | ICD-10-CM | POA: Diagnosis not present

## 2017-07-07 DIAGNOSIS — N2581 Secondary hyperparathyroidism of renal origin: Secondary | ICD-10-CM | POA: Diagnosis not present

## 2017-07-08 DIAGNOSIS — N186 End stage renal disease: Secondary | ICD-10-CM | POA: Diagnosis not present

## 2017-07-08 DIAGNOSIS — I871 Compression of vein: Secondary | ICD-10-CM | POA: Diagnosis not present

## 2017-07-08 DIAGNOSIS — Z992 Dependence on renal dialysis: Secondary | ICD-10-CM | POA: Diagnosis not present

## 2017-07-09 DIAGNOSIS — D509 Iron deficiency anemia, unspecified: Secondary | ICD-10-CM | POA: Diagnosis not present

## 2017-07-09 DIAGNOSIS — D631 Anemia in chronic kidney disease: Secondary | ICD-10-CM | POA: Diagnosis not present

## 2017-07-09 DIAGNOSIS — Z23 Encounter for immunization: Secondary | ICD-10-CM | POA: Diagnosis not present

## 2017-07-09 DIAGNOSIS — N186 End stage renal disease: Secondary | ICD-10-CM | POA: Diagnosis not present

## 2017-07-09 DIAGNOSIS — N2581 Secondary hyperparathyroidism of renal origin: Secondary | ICD-10-CM | POA: Diagnosis not present

## 2017-07-09 DIAGNOSIS — E1129 Type 2 diabetes mellitus with other diabetic kidney complication: Secondary | ICD-10-CM | POA: Diagnosis not present

## 2017-07-11 ENCOUNTER — Other Ambulatory Visit: Payer: Self-pay | Admitting: Cardiovascular Disease

## 2017-07-11 DIAGNOSIS — D631 Anemia in chronic kidney disease: Secondary | ICD-10-CM | POA: Diagnosis not present

## 2017-07-11 DIAGNOSIS — E1129 Type 2 diabetes mellitus with other diabetic kidney complication: Secondary | ICD-10-CM | POA: Diagnosis not present

## 2017-07-11 DIAGNOSIS — N2581 Secondary hyperparathyroidism of renal origin: Secondary | ICD-10-CM | POA: Diagnosis not present

## 2017-07-11 DIAGNOSIS — N186 End stage renal disease: Secondary | ICD-10-CM | POA: Diagnosis not present

## 2017-07-11 DIAGNOSIS — Z23 Encounter for immunization: Secondary | ICD-10-CM | POA: Diagnosis not present

## 2017-07-11 DIAGNOSIS — D509 Iron deficiency anemia, unspecified: Secondary | ICD-10-CM | POA: Diagnosis not present

## 2017-07-14 DIAGNOSIS — I739 Peripheral vascular disease, unspecified: Secondary | ICD-10-CM | POA: Diagnosis not present

## 2017-07-14 DIAGNOSIS — Z992 Dependence on renal dialysis: Secondary | ICD-10-CM | POA: Diagnosis not present

## 2017-07-14 DIAGNOSIS — E1149 Type 2 diabetes mellitus with other diabetic neurological complication: Secondary | ICD-10-CM | POA: Diagnosis not present

## 2017-07-14 DIAGNOSIS — E1151 Type 2 diabetes mellitus with diabetic peripheral angiopathy without gangrene: Secondary | ICD-10-CM | POA: Diagnosis not present

## 2017-07-14 DIAGNOSIS — Z1211 Encounter for screening for malignant neoplasm of colon: Secondary | ICD-10-CM | POA: Diagnosis not present

## 2017-07-14 DIAGNOSIS — N186 End stage renal disease: Secondary | ICD-10-CM | POA: Diagnosis not present

## 2017-07-14 DIAGNOSIS — I509 Heart failure, unspecified: Secondary | ICD-10-CM | POA: Diagnosis not present

## 2017-07-14 DIAGNOSIS — D631 Anemia in chronic kidney disease: Secondary | ICD-10-CM | POA: Diagnosis not present

## 2017-07-14 DIAGNOSIS — J302 Other seasonal allergic rhinitis: Secondary | ICD-10-CM | POA: Diagnosis not present

## 2017-07-14 DIAGNOSIS — E1122 Type 2 diabetes mellitus with diabetic chronic kidney disease: Secondary | ICD-10-CM | POA: Diagnosis not present

## 2017-07-14 DIAGNOSIS — I129 Hypertensive chronic kidney disease with stage 1 through stage 4 chronic kidney disease, or unspecified chronic kidney disease: Secondary | ICD-10-CM | POA: Diagnosis not present

## 2017-07-14 DIAGNOSIS — E1129 Type 2 diabetes mellitus with other diabetic kidney complication: Secondary | ICD-10-CM | POA: Diagnosis not present

## 2017-07-14 DIAGNOSIS — M109 Gout, unspecified: Secondary | ICD-10-CM | POA: Diagnosis not present

## 2017-07-14 DIAGNOSIS — D509 Iron deficiency anemia, unspecified: Secondary | ICD-10-CM | POA: Diagnosis not present

## 2017-07-14 DIAGNOSIS — I2510 Atherosclerotic heart disease of native coronary artery without angina pectoris: Secondary | ICD-10-CM | POA: Diagnosis not present

## 2017-07-14 DIAGNOSIS — E039 Hypothyroidism, unspecified: Secondary | ICD-10-CM | POA: Diagnosis not present

## 2017-07-14 DIAGNOSIS — Z9861 Coronary angioplasty status: Secondary | ICD-10-CM | POA: Diagnosis not present

## 2017-07-14 DIAGNOSIS — K219 Gastro-esophageal reflux disease without esophagitis: Secondary | ICD-10-CM | POA: Diagnosis not present

## 2017-07-14 DIAGNOSIS — I132 Hypertensive heart and chronic kidney disease with heart failure and with stage 5 chronic kidney disease, or end stage renal disease: Secondary | ICD-10-CM | POA: Diagnosis not present

## 2017-07-14 DIAGNOSIS — N2581 Secondary hyperparathyroidism of renal origin: Secondary | ICD-10-CM | POA: Diagnosis not present

## 2017-07-14 DIAGNOSIS — F419 Anxiety disorder, unspecified: Secondary | ICD-10-CM | POA: Diagnosis not present

## 2017-07-15 ENCOUNTER — Encounter: Payer: Self-pay | Admitting: Family Medicine

## 2017-07-15 ENCOUNTER — Ambulatory Visit: Payer: Medicare Other | Attending: Family Medicine | Admitting: Family Medicine

## 2017-07-15 VITALS — BP 142/80 | HR 70 | Temp 98.2°F | Ht 70.0 in | Wt 221.6 lb

## 2017-07-15 DIAGNOSIS — E1122 Type 2 diabetes mellitus with diabetic chronic kidney disease: Secondary | ICD-10-CM | POA: Diagnosis not present

## 2017-07-15 DIAGNOSIS — Z888 Allergy status to other drugs, medicaments and biological substances status: Secondary | ICD-10-CM | POA: Diagnosis not present

## 2017-07-15 DIAGNOSIS — I132 Hypertensive heart and chronic kidney disease with heart failure and with stage 5 chronic kidney disease, or end stage renal disease: Secondary | ICD-10-CM | POA: Insufficient documentation

## 2017-07-15 DIAGNOSIS — J302 Other seasonal allergic rhinitis: Secondary | ICD-10-CM | POA: Diagnosis not present

## 2017-07-15 DIAGNOSIS — I509 Heart failure, unspecified: Secondary | ICD-10-CM | POA: Insufficient documentation

## 2017-07-15 DIAGNOSIS — J45909 Unspecified asthma, uncomplicated: Secondary | ICD-10-CM | POA: Diagnosis not present

## 2017-07-15 DIAGNOSIS — I2510 Atherosclerotic heart disease of native coronary artery without angina pectoris: Secondary | ICD-10-CM

## 2017-07-15 DIAGNOSIS — I739 Peripheral vascular disease, unspecified: Secondary | ICD-10-CM

## 2017-07-15 DIAGNOSIS — M069 Rheumatoid arthritis, unspecified: Secondary | ICD-10-CM | POA: Insufficient documentation

## 2017-07-15 DIAGNOSIS — E1151 Type 2 diabetes mellitus with diabetic peripheral angiopathy without gangrene: Secondary | ICD-10-CM | POA: Diagnosis not present

## 2017-07-15 DIAGNOSIS — K219 Gastro-esophageal reflux disease without esophagitis: Secondary | ICD-10-CM | POA: Diagnosis not present

## 2017-07-15 DIAGNOSIS — I252 Old myocardial infarction: Secondary | ICD-10-CM | POA: Insufficient documentation

## 2017-07-15 DIAGNOSIS — E669 Obesity, unspecified: Secondary | ICD-10-CM | POA: Insufficient documentation

## 2017-07-15 DIAGNOSIS — E039 Hypothyroidism, unspecified: Secondary | ICD-10-CM

## 2017-07-15 DIAGNOSIS — E1149 Type 2 diabetes mellitus with other diabetic neurological complication: Secondary | ICD-10-CM | POA: Diagnosis not present

## 2017-07-15 DIAGNOSIS — N186 End stage renal disease: Secondary | ICD-10-CM

## 2017-07-15 DIAGNOSIS — Z79899 Other long term (current) drug therapy: Secondary | ICD-10-CM | POA: Insufficient documentation

## 2017-07-15 DIAGNOSIS — Z955 Presence of coronary angioplasty implant and graft: Secondary | ICD-10-CM | POA: Insufficient documentation

## 2017-07-15 DIAGNOSIS — Z1211 Encounter for screening for malignant neoplasm of colon: Secondary | ICD-10-CM

## 2017-07-15 DIAGNOSIS — E785 Hyperlipidemia, unspecified: Secondary | ICD-10-CM | POA: Diagnosis not present

## 2017-07-15 DIAGNOSIS — Z9861 Coronary angioplasty status: Secondary | ICD-10-CM | POA: Diagnosis not present

## 2017-07-15 DIAGNOSIS — Z992 Dependence on renal dialysis: Secondary | ICD-10-CM | POA: Insufficient documentation

## 2017-07-15 DIAGNOSIS — M109 Gout, unspecified: Secondary | ICD-10-CM | POA: Diagnosis not present

## 2017-07-15 DIAGNOSIS — Z8673 Personal history of transient ischemic attack (TIA), and cerebral infarction without residual deficits: Secondary | ICD-10-CM | POA: Diagnosis not present

## 2017-07-15 DIAGNOSIS — Z7982 Long term (current) use of aspirin: Secondary | ICD-10-CM | POA: Insufficient documentation

## 2017-07-15 DIAGNOSIS — Z7902 Long term (current) use of antithrombotics/antiplatelets: Secondary | ICD-10-CM | POA: Insufficient documentation

## 2017-07-15 DIAGNOSIS — F419 Anxiety disorder, unspecified: Secondary | ICD-10-CM | POA: Diagnosis not present

## 2017-07-15 DIAGNOSIS — Z880 Allergy status to penicillin: Secondary | ICD-10-CM | POA: Diagnosis not present

## 2017-07-15 DIAGNOSIS — E1140 Type 2 diabetes mellitus with diabetic neuropathy, unspecified: Secondary | ICD-10-CM | POA: Diagnosis not present

## 2017-07-15 DIAGNOSIS — Z794 Long term (current) use of insulin: Secondary | ICD-10-CM | POA: Insufficient documentation

## 2017-07-15 LAB — GLUCOSE, POCT (MANUAL RESULT ENTRY): POC Glucose: 110 mg/dl — AB (ref 70–99)

## 2017-07-15 MED ORDER — CLOPIDOGREL BISULFATE 75 MG PO TABS: 75.0000 mg | ORAL_TABLET | Freq: Every day | ORAL | 6 refills | Status: DC

## 2017-07-15 MED ORDER — LEVOTHYROXINE SODIUM 25 MCG PO TABS: ORAL_TABLET | ORAL | 6 refills | Status: DC

## 2017-07-15 MED ORDER — BUSPIRONE HCL 10 MG PO TABS: 10.0000 mg | ORAL_TABLET | Freq: Three times a day (TID) | ORAL | 6 refills | Status: DC

## 2017-07-15 MED ORDER — GABAPENTIN 400 MG PO CAPS: 400.0000 mg | ORAL_CAPSULE | Freq: Every day | ORAL | 6 refills | Status: DC

## 2017-07-15 MED ORDER — IPRATROPIUM BROMIDE 0.03 % NA SOLN: 2.0000 | Freq: Two times a day (BID) | NASAL | 6 refills | Status: AC

## 2017-07-15 MED ORDER — ALLOPURINOL 300 MG PO TABS: 300.0000 mg | ORAL_TABLET | Freq: Every day | ORAL | 1 refills | Status: DC

## 2017-07-15 NOTE — Progress Notes (Signed)
Subjective:  Patient ID: Brooke Stafford, female    DOB: 1955/10/19  Age: 62 y.o. MRN: [id number]  CC: Hypertension and Diabetes   HPI Brooke Stafford is a 62 year old female with a history of type 2 diabetes mellitus (A1c 5.6), hypertension, CHF (2-D echo 35 to 40% from 2-D echo of 04/2016), CAD status post PCI ,end-stage renal disease (currently on hemodialysis on Mondays, Wednesdays and Fridays), peripheral vascular disease (status post left SFA stent placement in 2012 and 2014 and right SFA and popliteal artery angioplasty and stent placement) here for a follow-up visit.  She complains of abdominal bloating and excessive gassiness.  She was previously on Protonix but states she has not received this lately from her pharmacy.  Denies nausea, vomiting, diarrhea or constipation. With regards to her diabetes, her regimen was adjusted at her last visit with Endocrine on 06/12/17 due to hypoglycemia and she reports improvement in glycemic control with no recent lows. Also saw her cardiologist 3 weeks ago and her dose of isosorbide was decreased.   Her anxiety has improved on BuSpar and hydroxyzine but she does endorse feeling anxious sometimes.  She tried Prozac 2 months ago but was unable to tolerate it.  Denies suicidal ideation or intents. Her arthralgias have improved ever since she was placed on Plaquenil by rheumatology for rheumatoid arthritis. Hemodialysis sessions are going well; she denies shortness of breath or pedal edema   Past Medical History:  Diagnosis Date  . Anemia   . Anginal pain ([place])   . Anxiety   . Arthritis    "stiff fingers and knees" (08/04/2013), (12/12/2014)  . Asthma   . CAD (coronary artery disease) 2002; 2015   CABG x 6 2002, cath 2011- med Rx stent DES VG-Diag  . CAD (coronary artery disease) of artery bypass graft; DES to VG-Diag 09/28/13 11/09/2013  . Cataract   . CHF (congestive heart failure) ([place])    "in 2002" (11/26/2012)  . Chronic bronchitis ([place])    "q year; in the winter"   . CKD (chronic kidney disease)    stage 4, followed by [facility]  . Coronary artery disease 2002   CABG x 6. Cath 5/11- med Rx  . Diabetic neuropathy ([place])   . GERD (gastroesophageal reflux disease)   . Gout    "right big toe"  . Headache    "~ q week" (08/04/2013); "~ twice/month" (12/12/2014)  . History of blood transfusion 2002   "when I had OHS"  . Hyperlipidemia   . Hypertension   . Hypothyroid    treated  . Migraines    "couple times/year" (08/04/2013), (12/12/2014)  . Myocardial infarction ([place]) 2000; 2002; 2011  . Obesity (BMI 35.0-39.9 without comorbidity)   . Peripheral vascular disease ([place]) 12/12   LSFA PTA  . Pneumonia    "3 times I think" (12/12/2014)  . Stroke [facility])    " mini stroke"  . Type II diabetes mellitus ([place])     Past Surgical History:  Procedure Laterality Date  . ABDOMINAL AORTAGRAM N/A 04/05/2011   Procedure: ABDOMINAL AORTAGRAM;  Surgeon: Lorretta Harp, MD;  Location: [facility] CATH LAB;  Service: Cardiovascular;  Laterality: N/A;  . ABDOMINAL AORTOGRAM N/A 10/30/2016   Procedure: Abdominal Aortogram;  Surgeon: Wellington Hampshire, MD;  Location: [place] CV LAB;  Service: Cardiovascular;  Laterality: N/A;  . APPENDECTOMY  1980  . AV FISTULA PLACEMENT Left 05/10/2016   Procedure: LEFT ARM ARTERIOVENOUS (AV) FISTULA CREATION;  Surgeon: Rosetta Posner,  MD;  Location: [place];  Service: Vascular;  Laterality: Left;  . BASCILIC VEIN TRANSPOSITION Left 06/26/2016   Procedure: SECOND STAGE BASILIC VEIN TRANSPOSITION;  Surgeon: Rosetta Posner, MD;  Location: [place];  Service: Vascular;  Laterality: Left;  . BREAST CYST EXCISION Right 1970's  . CARDIAC CATHETERIZATION  2002  . CARDIAC CATHETERIZATION N/A 12/12/2014   Procedure: Left Heart Cath and Cors/Grafts Angiography;  Surgeon: Peter M Martinique, MD;  Location: [place] CV LAB;  Service: Cardiovascular;  Laterality: N/A;  . CARDIAC CATHETERIZATION N/A 12/12/2014   Procedure: Coronary  Stent Intervention;  Surgeon: Peter M Martinique, MD;  Location: [place] CV LAB;  Service: Cardiovascular;  Laterality: N/A;  . CATARACT EXTRACTION, BILATERAL    . CESAREAN SECTION  1978; 1980  . CHOLECYSTECTOMY  1982  . CORONARY ANGIOPLASTY WITH STENT PLACEMENT  2004; 2012   "I have 2 stents" (08/04/2013)  . CORONARY ANGIOPLASTY WITH STENT PLACEMENT  09/28/13   PTCA/ DES Xience stent to VG-Diag   . CORONARY ARTERY BYPASS GRAFT  11/20/2000   x6 LIMA to distal LAD, svg to first diag, svg to ramus intermediate branch and swquential SVG to cir marginal branch, SVG to posterior descending coronary and sequential SVG to first right posterolateral branch  . eye injections    . INSERTION OF DIALYSIS CATHETER Right 05/10/2016   Procedure: INSERTION OF DIALYSIS CATHETER - Right Internal Jugular Placement;  Surgeon: Rosetta Posner, MD;  Location: [place];  Service: Vascular;  Laterality: Right;  . LEFT HEART CATHETERIZATION WITH CORONARY/GRAFT ANGIOGRAM N/A 09/28/2013   Procedure: LEFT HEART CATHETERIZATION WITH Beatrix Fetters;  Surgeon: Peter M Martinique, MD;  Location: [facility] CATH LAB;  Service: Cardiovascular;  Laterality: N/A;  . LOWER EXTREMITY ANGIOGRAM  12/01/2012   Procedure: LOWER EXTREMITY ANGIOGRAM;  Surgeon: Lorretta Harp, MD;  Location: [facility] CATH LAB;  Service: Cardiovascular;;  . LOWER EXTREMITY ANGIOGRAPHY Right 10/30/2016   Procedure: Lower Extremity Angiography;  Surgeon: Wellington Hampshire, MD;  Location: [place] CV LAB;  Service: Cardiovascular;  Laterality: Right;  . NM MYOCAR PERF WALL MOTION  08/27/2004   negative  . PERCUTANEOUS STENT INTERVENTION Left 12/01/2012   Procedure: PERCUTANEOUS STENT INTERVENTION;  Surgeon: Lorretta Harp, MD;  Location: [facility] CATH LAB;  Service: Cardiovascular;  Laterality: Left;  Left SFA  . PERIPHERAL ARTERIAL STENT GRAFT Left    SFA/notes 04/07/2011 (11/30/2012)  . PERIPHERAL VASCULAR ATHERECTOMY Right 10/30/2016   Procedure: Peripheral Vascular  Atherectomy;  Surgeon: Wellington Hampshire, MD;  Location: [place] CV LAB;  Service: Cardiovascular;  Laterality: Right;  cancel unable to  . PERIPHERAL VASCULAR INTERVENTION Right 10/30/2016   Procedure: Peripheral Vascular Intervention;  Surgeon: Wellington Hampshire, MD;  Location: [place] CV LAB;  Service: Cardiovascular;  Laterality: Right;  SFA  . RENAL ANGIOGRAM N/A 04/05/2011   Procedure: RENAL ANGIOGRAM;  Surgeon: Lorretta Harp, MD;  Location: [facility] CATH LAB;  Service: Cardiovascular;  Laterality: N/A;  . TUBAL LIGATION  1980    Family History  Problem Relation Age of Onset  . Diabetes Mother   . Hypertension Mother   . Stroke Mother   . Hypertension Father   . Hypertension Brother   . Hypertension Sister   . Diabetes Sister   . Hyperlipidemia Sister     Allergies  Allergen Reactions  . Digoxin And Related Diarrhea and Other (See Comments)    TOXIC DRUG LEVELS Patient stated she almost died. Had flu like symptoms as well as  diarrhea.  . Hydralazine Shortness Of Breath  . Penicillins Cross Reactors Hives and Other (See Comments)    HIGH FEVER  Has patient had a PCN reaction causing immediate rash, facial/tongue/throat swelling, SOB or lightheadedness with hypotension: No Has patient had a PCN reaction causing SEVERE RASH INVOLVING MUCUS MEMBRANES or SKIN NECROSIS  #  #  #  YES  #  #  #  Has patient had a PCN reaction that required hospitalization: Unk Has patient had a PCN reaction occurring within the last 10 years: Unk If all of the above answers are "NO", then may proceed with Cephalosporin use.   [name] Lisinopril Other (See Comments)    Felt like she had the flu. Was very sick!!!  . Adhesive [Tape] Rash and Other (See Comments)    bruising     Outpatient Medications Prior to Visit  Medication Sig Dispense Refill  . aspirin 81 MG chewable tablet Chew 1 tablet (81 mg total) by mouth daily. 30 tablet 10  . atorvastatin (LIPITOR) 40 MG tablet Take 1 tablet (40 mg  total) by mouth every evening. 90 tablet 3  . cetirizine (ZYRTEC) 10 MG tablet Take 10 mg by mouth daily.    . hydrOXYzine (ATARAX/VISTARIL) 25 MG tablet Take 0.5 tablets (12.5 mg total) by mouth daily as needed. 30 tablet 0  . Insulin Glargine (BASAGLAR KWIKPEN) 100 UNIT/ML SOPN INJECT 20 UNITS INTO THE SKIN AT BEDTIME 45 mL 3  . isosorbide mononitrate (IMDUR) 60 MG 24 hr tablet take 2 tablets by mouth once daily 180 tablet 1  . metoprolol tartrate (LOPRESSOR) 25 MG tablet TAKE 1/2 TABLET BY MOUTH TWICE A DAY 90 tablet 3  . nitroGLYCERIN (NITROSTAT) 0.4 MG SL tablet Place 1 tablet (0.4 mg total) under the tongue every 5 (five) minutes as needed. (Patient taking differently: Place 0.4 mg under the tongue every 5 (five) minutes as needed for chest pain. ) 30 tablet 2  . NOVOLOG FLEXPEN 100 UNIT/ML FlexPen INJECT 25 UNITS BEFORE BREAKFAST AND INJECT 20 UNITS BEFORE LUNCH AND DINNER 75 mL 0  . pantoprazole (PROTONIX) 40 MG tablet Take 1 tablet (40 mg total) by mouth daily. 30 tablet 5  . sevelamer (RENAGEL) 800 MG tablet Take 2,400 mg by mouth 3 (three) times daily with meals.    . traMADol (ULTRAM) 50 MG tablet Take 1 tablet (50 mg total) by mouth every 12 (twelve) hours as needed. 40 tablet 1  . allopurinol (ZYLOPRIM) 300 MG tablet Take 1 tablet (300 mg total) daily by mouth. 90 tablet 1  . busPIRone (BUSPAR) 10 MG tablet Take 1 tablet (10 mg total) by mouth 3 (three) times daily. 90 tablet 2  . clopidogrel (PLAVIX) 75 MG tablet Take 1 tablet (75 mg total) daily by mouth. 90 tablet 1  . fluticasone (FLONASE) 50 MCG/ACT nasal spray Place 2 sprays daily as needed into both nostrils for allergies or rhinitis. 16 g 3  . gabapentin (NEURONTIN) 400 MG capsule Take 1 capsule (400 mg total) by mouth at bedtime. 30 capsule 5  . levothyroxine (SYNTHROID, LEVOTHROID) 25 MCG tablet take 1 tablet by mouth every morning before BREAKFAST 30 tablet 2   No facility-administered medications prior to visit.      ROS Review of Systems  Constitutional: Negative for activity change, appetite change and fatigue.  HENT: Negative for congestion, sinus pressure and sore throat.   Eyes: Negative for visual disturbance.  Respiratory: Negative for cough, chest tightness, shortness of breath and wheezing.  Cardiovascular: Negative for chest pain and palpitations.  Gastrointestinal: Negative for abdominal distention, abdominal pain and constipation.       Positive for abdominal bloating  Endocrine: Negative for polydipsia.  Genitourinary: Negative for dysuria and frequency.  Musculoskeletal: Negative for arthralgias and back pain.  Skin: Negative for rash.  Neurological: Negative for tremors, light-headedness and numbness.  Hematological: Does not bruise/bleed easily.  Psychiatric/Behavioral: Negative for agitation and behavioral problems.    Objective:  BP (!) 142/80   Pulse 70   Temp 98.2 F (36.8 C) (Oral)   Ht 5\' 10"  (1.778 m)   Wt 221 lb 9.6 oz (100.5 kg)   LMP 10/13/2014 (Exact Date)   SpO2 99%   BMI 31.80 kg/m   BP/Weight 07/15/2017 06/17/2017 2/70/3500  Systolic BP 938 182 993  Diastolic BP 80 57 70  Wt. (Lbs) 221.6 222 223.5  BMI 31.8 31.4 32.07      Physical Exam  Constitutional: She is oriented to person, place, and time. She appears well-developed and well-nourished.  Cardiovascular: Normal rate, normal heart sounds and intact distal pulses.  No murmur heard. Pulmonary/Chest: Effort normal and breath sounds normal. She has no wheezes. She has no rales. She exhibits no tenderness.  Abdominal: Soft. Bowel sounds are normal. She exhibits no distension and no mass. There is no tenderness.  Musculoskeletal: Normal range of motion.  Left arm AV fistula  Neurological: She is alert and oriented to person, place, and time.  Skin: Skin is warm and dry.  Psychiatric: She has a normal mood and affect.    Lab Results  Component Value Date   HGBA1C 5.6 06/12/2017     Assessment  & Plan:   1. Type 2 diabetes mellitus with ESRD (end-stage renal disease) ([place]) Controlled with A1c of 5.6 Insulin regimen adjusted by endocrine to prevent hypoglycemia given chronic kidney disease Continue diabetic diet - POCT glucose (manual entry) - Lipid panel; Future  2. Gout of big toe No recent flares - allopurinol (ZYLOPRIM) 300 MG tablet; Take 1 tablet (300 mg total) by mouth daily.  Dispense: 90 tablet; Refill: 1  3. Anxiety Secondary to underlying medical conditions She has noticed improvement - busPIRone (BUSPAR) 10 MG tablet; Take 1 tablet (10 mg total) by mouth 3 (three) times daily.  Dispense: 90 tablet; Refill: 6  4. Other diabetic neurological complication associated with type 2 diabetes mellitus (HCC) Stable - gabapentin (NEURONTIN) 400 MG capsule; Take 1 capsule (400 mg total) by mouth at bedtime.  Dispense: 30 capsule; Refill: 6  5. Hypothyroidism, acquired Controlled - levothyroxine (SYNTHROID, LEVOTHROID) 25 MCG tablet; take 1 tablet by mouth every morning before BREAKFAST  Dispense: 30 tablet; Refill: 6 - TSH; Future  6. Gastroesophageal reflux disease without esophagitis Resumed omeprazole , Bloating could be secondary to H. pylori versus gastroparesis - H. pylori breath test  7. ESRD (end stage renal disease) ([place]) Continue hemodialysis as per schedule  8. Screening for colon cancer Previously not cleared for colonoscopy by cardiologist due to the fact that she is a high-risk patient - Fecal occult blood, imunochemical([facility])  9. Seasonal allergies Controlled on Flonase We will switch to Atrovent nasal spray - ipratropium (ATROVENT) 0.03 % nasal spray; Place 2 sprays into both nostrils every 12 (twelve) hours.  Dispense: 30 mL; Refill: 6  10. PVD, LSFA PTA 12/12 - clopidogrel (PLAVIX) 75 MG tablet; Take 1 tablet (75 mg total) by mouth daily.  Dispense: 30 tablet; Refill: 6   Meds ordered this encounter  Medications  .  allopurinol  (ZYLOPRIM) 300 MG tablet    Sig: Take 1 tablet (300 mg total) by mouth daily.    Dispense:  90 tablet    Refill:  1  . busPIRone (BUSPAR) 10 MG tablet    Sig: Take 1 tablet (10 mg total) by mouth 3 (three) times daily.    Dispense:  90 tablet    Refill:  6    Discontinue Previous dose  . clopidogrel (PLAVIX) 75 MG tablet    Sig: Take 1 tablet (75 mg total) by mouth daily.    Dispense:  30 tablet    Refill:  6  . gabapentin (NEURONTIN) 400 MG capsule    Sig: Take 1 capsule (400 mg total) by mouth at bedtime.    Dispense:  30 capsule    Refill:  6  . levothyroxine (SYNTHROID, LEVOTHROID) 25 MCG tablet    Sig: take 1 tablet by mouth every morning before BREAKFAST    Dispense:  30 tablet    Refill:  6  . ipratropium (ATROVENT) 0.03 % nasal spray    Sig: Place 2 sprays into both nostrils every 12 (twelve) hours.    Dispense:  30 mL    Refill:  6    Follow-up: Return in about 6 months (around 01/14/2018) for follow up of chronic medical conditions.   Charlott Rakes MD

## 2017-07-15 NOTE — Patient Instructions (Signed)

## 2017-07-16 DIAGNOSIS — D631 Anemia in chronic kidney disease: Secondary | ICD-10-CM | POA: Diagnosis not present

## 2017-07-16 DIAGNOSIS — N186 End stage renal disease: Secondary | ICD-10-CM | POA: Diagnosis not present

## 2017-07-16 DIAGNOSIS — D509 Iron deficiency anemia, unspecified: Secondary | ICD-10-CM | POA: Diagnosis not present

## 2017-07-16 DIAGNOSIS — E1129 Type 2 diabetes mellitus with other diabetic kidney complication: Secondary | ICD-10-CM | POA: Diagnosis not present

## 2017-07-16 DIAGNOSIS — N2581 Secondary hyperparathyroidism of renal origin: Secondary | ICD-10-CM | POA: Diagnosis not present

## 2017-07-17 ENCOUNTER — Telehealth: Payer: Self-pay | Admitting: Cardiovascular Disease

## 2017-07-17 LAB — H. PYLORI BREATH TEST: H PYLORI BREATH TEST: NEGATIVE

## 2017-07-17 NOTE — Telephone Encounter (Signed)
Closed Encounter  °

## 2017-07-18 ENCOUNTER — Telehealth: Payer: Self-pay

## 2017-07-18 DIAGNOSIS — E1129 Type 2 diabetes mellitus with other diabetic kidney complication: Secondary | ICD-10-CM | POA: Diagnosis not present

## 2017-07-18 DIAGNOSIS — N2581 Secondary hyperparathyroidism of renal origin: Secondary | ICD-10-CM | POA: Diagnosis not present

## 2017-07-18 DIAGNOSIS — D509 Iron deficiency anemia, unspecified: Secondary | ICD-10-CM | POA: Diagnosis not present

## 2017-07-18 DIAGNOSIS — D631 Anemia in chronic kidney disease: Secondary | ICD-10-CM | POA: Diagnosis not present

## 2017-07-18 DIAGNOSIS — N186 End stage renal disease: Secondary | ICD-10-CM | POA: Diagnosis not present

## 2017-07-18 NOTE — Telephone Encounter (Signed)
Patient was called and informed of lab results. 

## 2017-07-21 DIAGNOSIS — D631 Anemia in chronic kidney disease: Secondary | ICD-10-CM | POA: Diagnosis not present

## 2017-07-21 DIAGNOSIS — D509 Iron deficiency anemia, unspecified: Secondary | ICD-10-CM | POA: Diagnosis not present

## 2017-07-21 DIAGNOSIS — N186 End stage renal disease: Secondary | ICD-10-CM | POA: Diagnosis not present

## 2017-07-21 DIAGNOSIS — N2581 Secondary hyperparathyroidism of renal origin: Secondary | ICD-10-CM | POA: Diagnosis not present

## 2017-07-21 DIAGNOSIS — E1129 Type 2 diabetes mellitus with other diabetic kidney complication: Secondary | ICD-10-CM | POA: Diagnosis not present

## 2017-07-22 ENCOUNTER — Ambulatory Visit: Payer: Medicare Other | Attending: Family Medicine

## 2017-07-22 DIAGNOSIS — N186 End stage renal disease: Secondary | ICD-10-CM | POA: Insufficient documentation

## 2017-07-22 DIAGNOSIS — E1122 Type 2 diabetes mellitus with diabetic chronic kidney disease: Secondary | ICD-10-CM | POA: Diagnosis not present

## 2017-07-22 DIAGNOSIS — E039 Hypothyroidism, unspecified: Secondary | ICD-10-CM | POA: Diagnosis not present

## 2017-07-22 NOTE — Progress Notes (Signed)
Patient here for lab visit only 

## 2017-07-23 DIAGNOSIS — E1129 Type 2 diabetes mellitus with other diabetic kidney complication: Secondary | ICD-10-CM | POA: Diagnosis not present

## 2017-07-23 DIAGNOSIS — N186 End stage renal disease: Secondary | ICD-10-CM | POA: Diagnosis not present

## 2017-07-23 DIAGNOSIS — D509 Iron deficiency anemia, unspecified: Secondary | ICD-10-CM | POA: Diagnosis not present

## 2017-07-23 DIAGNOSIS — N2581 Secondary hyperparathyroidism of renal origin: Secondary | ICD-10-CM | POA: Diagnosis not present

## 2017-07-23 DIAGNOSIS — D631 Anemia in chronic kidney disease: Secondary | ICD-10-CM | POA: Diagnosis not present

## 2017-07-23 LAB — LIPID PANEL
Chol/HDL Ratio: 2.7 ratio (ref 0.0–4.4)
Cholesterol, Total: 118 mg/dL (ref 100–199)
HDL: 43 mg/dL (ref >39)
LDL Calculated: 43 mg/dL (ref 0–99)
Triglycerides: 160 mg/dL — ABNORMAL HIGH (ref 0–149)
VLDL Cholesterol Cal: 32 mg/dL (ref 5–40)

## 2017-07-23 LAB — TSH: TSH: 1.58 u[IU]/mL (ref 0.450–4.500)

## 2017-07-25 ENCOUNTER — Other Ambulatory Visit: Payer: Self-pay | Admitting: Licensed Clinical Social Worker

## 2017-07-25 DIAGNOSIS — D631 Anemia in chronic kidney disease: Secondary | ICD-10-CM | POA: Diagnosis not present

## 2017-07-25 DIAGNOSIS — D509 Iron deficiency anemia, unspecified: Secondary | ICD-10-CM | POA: Diagnosis not present

## 2017-07-25 DIAGNOSIS — N186 End stage renal disease: Secondary | ICD-10-CM | POA: Diagnosis not present

## 2017-07-25 DIAGNOSIS — E1129 Type 2 diabetes mellitus with other diabetic kidney complication: Secondary | ICD-10-CM | POA: Diagnosis not present

## 2017-07-25 DIAGNOSIS — N2581 Secondary hyperparathyroidism of renal origin: Secondary | ICD-10-CM | POA: Diagnosis not present

## 2017-07-25 NOTE — Patient Outreach (Addendum)
[place]) Care Management  07/25/2017  Brooke Stafford 04-13-1956 [id number]  Assessment- CSW completed initial outreach attempt on 07/25/17 after receiving message from [place] Management Assistant that patient is contact [facility] asking for housing resources. CSW was unable to reach patient successfully. [facility] CSW will mail unsuccessful outreach letter at this tme and will complete second outreach attempt next week.  [name], BSW, MSW, [email or web address] Phone: [phone] Fax: [phone]

## 2017-07-28 ENCOUNTER — Encounter: Payer: Self-pay | Admitting: Obstetrics and Gynecology

## 2017-07-28 DIAGNOSIS — D631 Anemia in chronic kidney disease: Secondary | ICD-10-CM | POA: Diagnosis not present

## 2017-07-28 DIAGNOSIS — N186 End stage renal disease: Secondary | ICD-10-CM | POA: Diagnosis not present

## 2017-07-28 DIAGNOSIS — N2581 Secondary hyperparathyroidism of renal origin: Secondary | ICD-10-CM | POA: Diagnosis not present

## 2017-07-28 DIAGNOSIS — D509 Iron deficiency anemia, unspecified: Secondary | ICD-10-CM | POA: Diagnosis not present

## 2017-07-28 DIAGNOSIS — E1129 Type 2 diabetes mellitus with other diabetic kidney complication: Secondary | ICD-10-CM | POA: Diagnosis not present

## 2017-07-29 ENCOUNTER — Other Ambulatory Visit: Payer: Self-pay | Admitting: Licensed Clinical Social Worker

## 2017-07-29 NOTE — Patient Outreach (Signed)
[place] [facility]) Care Management  07/29/2017  JULLIANA WHITMYER 1955-10-01 [id number]  Assessment-CSW completed second outreach attempt today. CSW unable to reach patient successfully. CSW left a HIPPA compliant voice message encouraging patient to return call once available.  Plan-CSW will await return call or complete third and final outreach within two weeks.  [name], BSW, MSW, [email or web address] Phone: [phone] Fax: [phone]

## 2017-07-30 ENCOUNTER — Other Ambulatory Visit: Payer: Self-pay | Admitting: Licensed Clinical Social Worker

## 2017-07-30 DIAGNOSIS — E1129 Type 2 diabetes mellitus with other diabetic kidney complication: Secondary | ICD-10-CM | POA: Diagnosis not present

## 2017-07-30 DIAGNOSIS — E039 Hypothyroidism, unspecified: Secondary | ICD-10-CM | POA: Diagnosis not present

## 2017-07-30 DIAGNOSIS — N2581 Secondary hyperparathyroidism of renal origin: Secondary | ICD-10-CM | POA: Diagnosis not present

## 2017-07-30 DIAGNOSIS — N186 End stage renal disease: Secondary | ICD-10-CM | POA: Diagnosis not present

## 2017-07-30 DIAGNOSIS — D509 Iron deficiency anemia, unspecified: Secondary | ICD-10-CM | POA: Diagnosis not present

## 2017-07-30 DIAGNOSIS — D631 Anemia in chronic kidney disease: Secondary | ICD-10-CM | POA: Diagnosis not present

## 2017-07-30 IMAGING — CR DG CHEST 2V
2 series · 2 of 2 positions shown · non-contrast
Comparison: Prior radiograph from 05/07/2016.

CLINICAL DATA: Initial evaluation for pulmonary edema with CHF.

EXAM:
CHEST  2 VIEW

[chest lat]
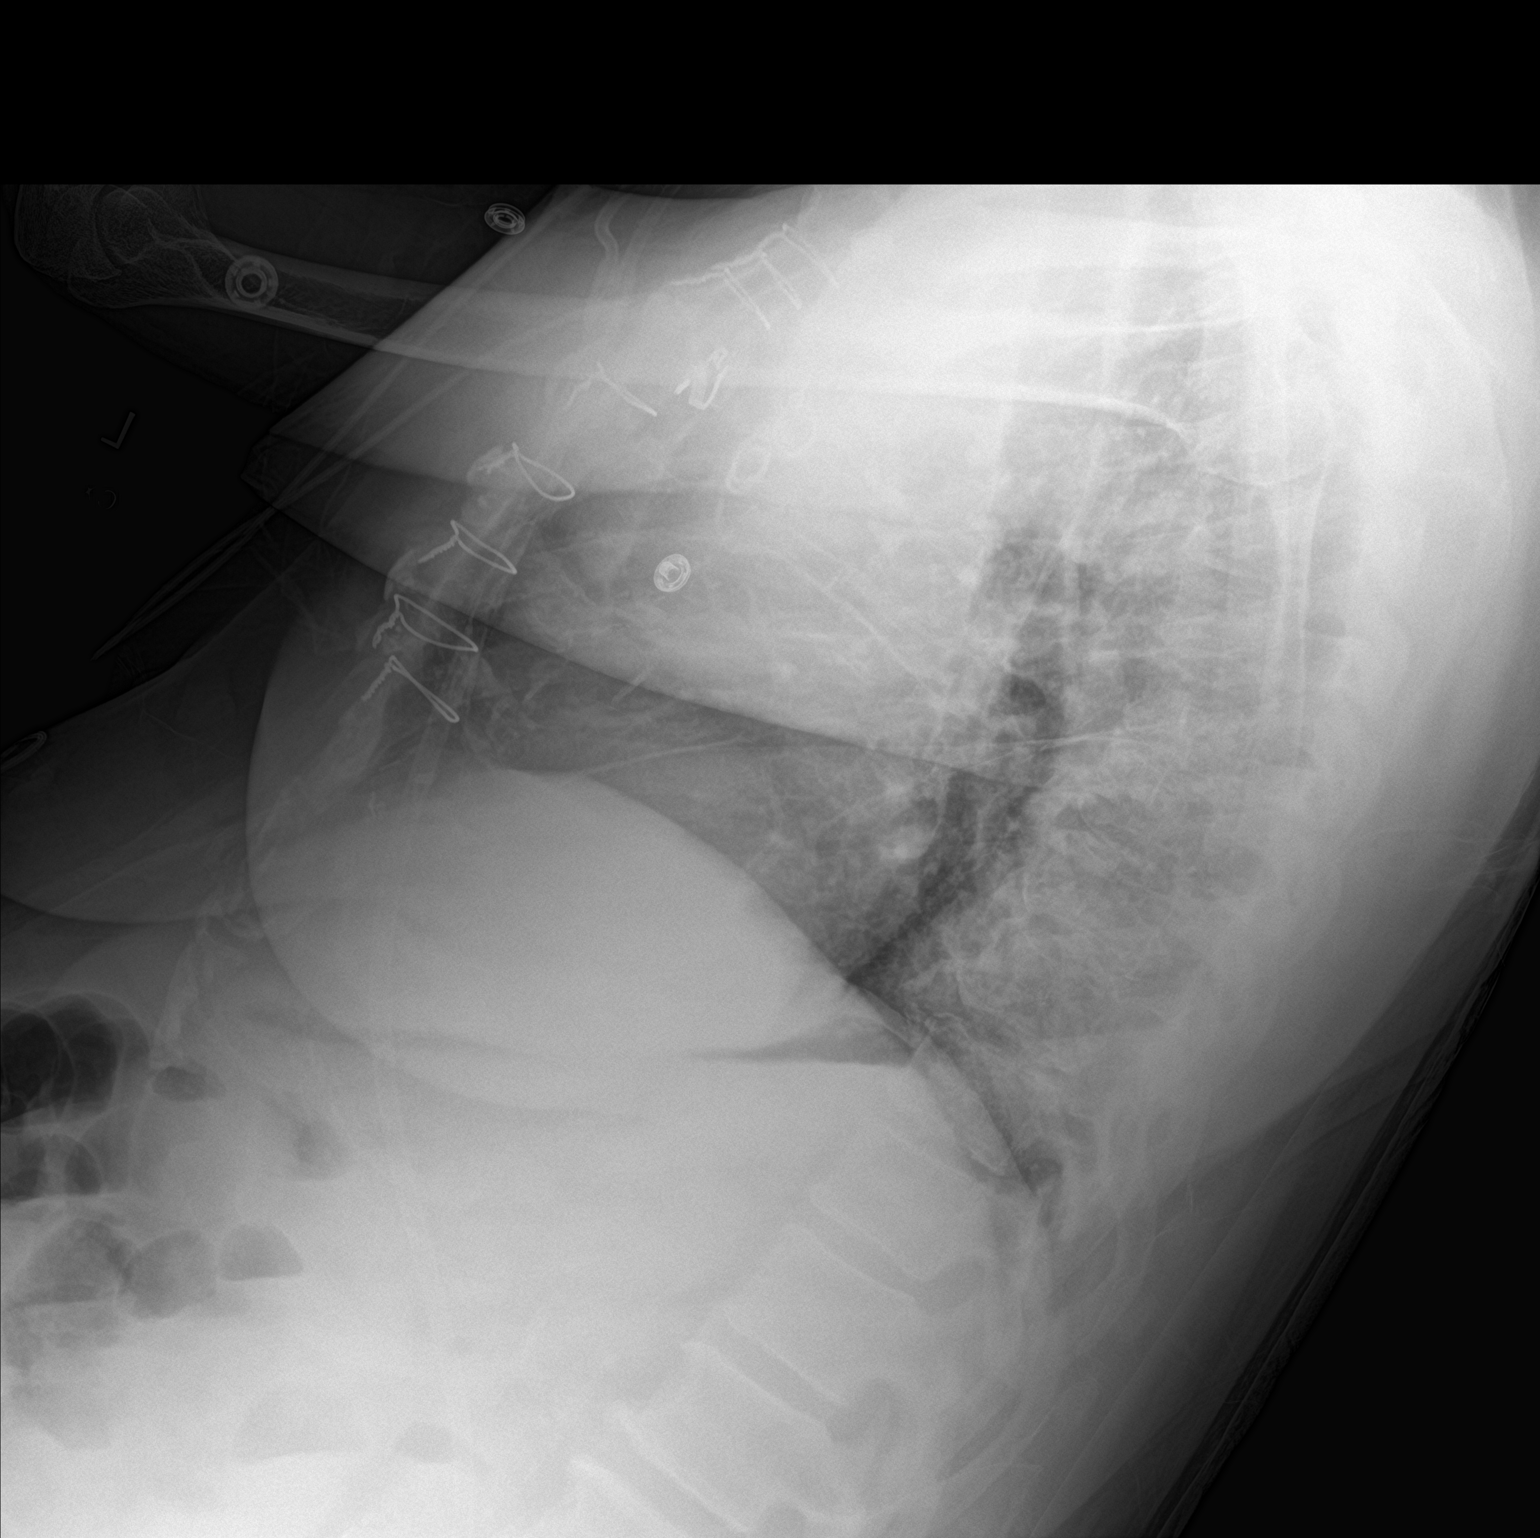

[chest ap]
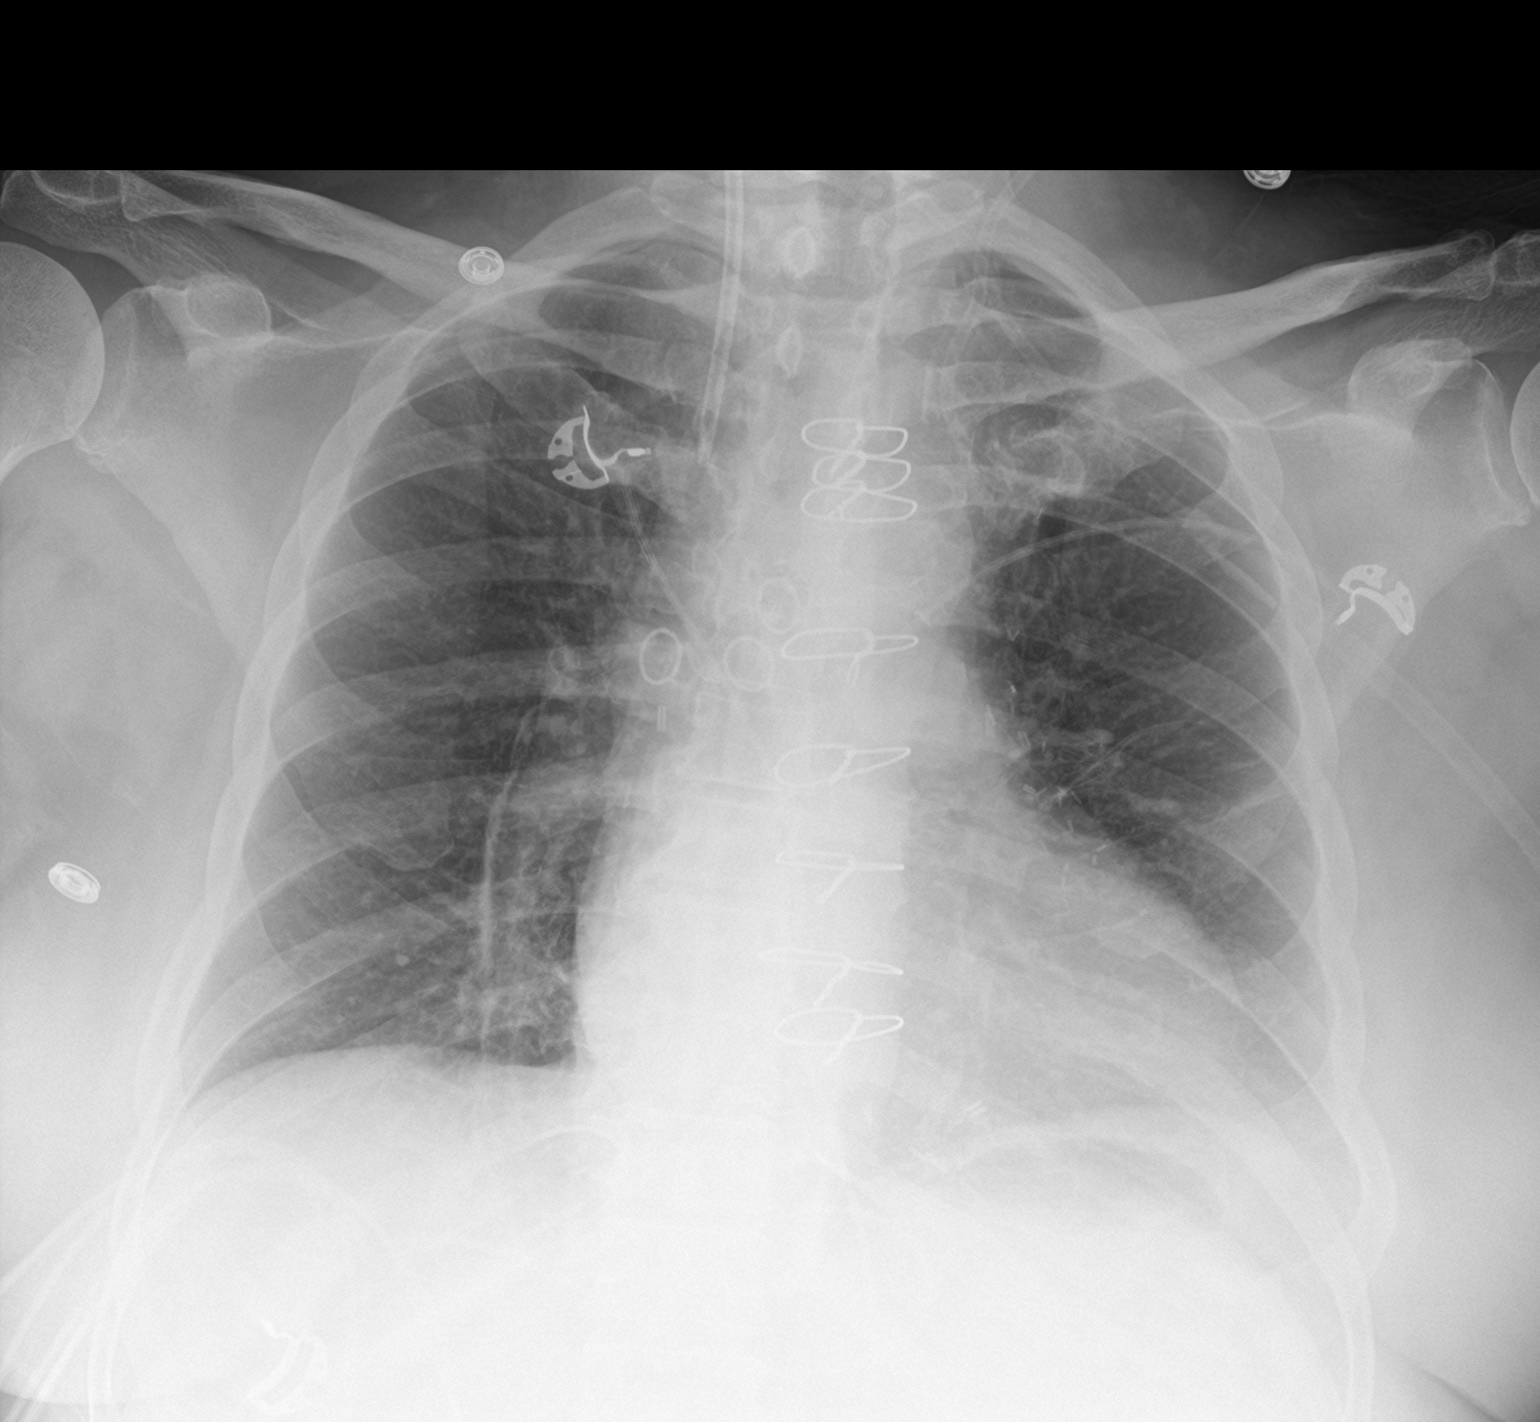

[2 of 2 positions shown; findings below may reference images not displayed]

FINDINGS: Right IJ approach central venous catheter remains in place. Median
sternotomy wires underlying CABG markers and surgical clips noted,
stable. Cardiomegaly unchanged.

Lungs normally inflated. Interval improvement in an diffuse
pulmonary edema, with mild residual perihilar vascular congestion
now evident. Continued interval clearance of opacity at the left
lung base. No new focal airspace disease. Oblique linear opacity
within the left mid lung most compatible with atelectasis/scar. No
definite significant pleural effusion. No pneumothorax.

No acute osseous abnormality.
IMPRESSION: 1. Interval improvement/ resolution of previously seen pulmonary
edema, with mild residual perihilar vascular congestion.
2. No new active cardiopulmonary disease identified.

## 2017-07-30 NOTE — Patient Outreach (Addendum)
[place] [facility]) Care Management  07/30/2017  Brooke Stafford 1955-05-29 [id number]  Assessment- [facility] CSW received incoming call from patient. Patient reports that she contacted [employer] to check on her wait status. Patient reports that she was informed that by a staff member that her application was incomplete. Patient does not know why when questioned by CSW. CSW questioned if she was checking her voice messages and mail box regularly to make sure that she responded to any outreaches by [employer] and she declined receiving any missed calls or missed mail. Patient is aware that Section 8 wait list is now closed and patient must wait until wait list reopens to reapply. Patient reports being interested in gaining housing resources by mail. [facility] CSW completed entire review of each housing resources. Patient familiar with [employer] as well. Patient reports doing very well otherwise stating "I'm actually doing a lot better. I've just been busy going to dialysis and taking care of things." Patient denies any further social work needs. Patient is agreeable to [facility] mailing her housing resources at this time and [facility] CSW will follow up within two weeks to ensure resources were received. [facility] CSW will not open case.   Plan-[facility] CSW will send message to [place] and ask to send housing resources that were requested by patient. [facility] CSW will follow up within two weeks.  [name], BSW, MSW, [email or web address] Phone: [phone] Fax: [phone]

## 2017-07-31 NOTE — Patient Outreach (Signed)
[place] [facility]) Care Management  07/31/2017  Brooke Stafford 1955/12/11 [id number]   Request received from [place], [name], to mail housing resources to patient.  Resources mailed today, 07/31/17.  Geographical information systems officer, Systems developer

## 2017-08-01 DIAGNOSIS — D631 Anemia in chronic kidney disease: Secondary | ICD-10-CM | POA: Diagnosis not present

## 2017-08-01 DIAGNOSIS — E1129 Type 2 diabetes mellitus with other diabetic kidney complication: Secondary | ICD-10-CM | POA: Diagnosis not present

## 2017-08-01 DIAGNOSIS — D509 Iron deficiency anemia, unspecified: Secondary | ICD-10-CM | POA: Diagnosis not present

## 2017-08-01 DIAGNOSIS — N186 End stage renal disease: Secondary | ICD-10-CM | POA: Diagnosis not present

## 2017-08-01 DIAGNOSIS — N2581 Secondary hyperparathyroidism of renal origin: Secondary | ICD-10-CM | POA: Diagnosis not present

## 2017-08-04 DIAGNOSIS — E1129 Type 2 diabetes mellitus with other diabetic kidney complication: Secondary | ICD-10-CM | POA: Diagnosis not present

## 2017-08-04 DIAGNOSIS — N186 End stage renal disease: Secondary | ICD-10-CM | POA: Diagnosis not present

## 2017-08-04 DIAGNOSIS — N2581 Secondary hyperparathyroidism of renal origin: Secondary | ICD-10-CM | POA: Diagnosis not present

## 2017-08-04 DIAGNOSIS — D509 Iron deficiency anemia, unspecified: Secondary | ICD-10-CM | POA: Diagnosis not present

## 2017-08-04 DIAGNOSIS — D631 Anemia in chronic kidney disease: Secondary | ICD-10-CM | POA: Diagnosis not present

## 2017-08-04 DIAGNOSIS — Z1211 Encounter for screening for malignant neoplasm of colon: Secondary | ICD-10-CM | POA: Diagnosis not present

## 2017-08-06 DIAGNOSIS — D631 Anemia in chronic kidney disease: Secondary | ICD-10-CM | POA: Diagnosis not present

## 2017-08-06 DIAGNOSIS — N186 End stage renal disease: Secondary | ICD-10-CM | POA: Diagnosis not present

## 2017-08-06 DIAGNOSIS — D509 Iron deficiency anemia, unspecified: Secondary | ICD-10-CM | POA: Diagnosis not present

## 2017-08-06 DIAGNOSIS — E1129 Type 2 diabetes mellitus with other diabetic kidney complication: Secondary | ICD-10-CM | POA: Diagnosis not present

## 2017-08-06 DIAGNOSIS — N2581 Secondary hyperparathyroidism of renal origin: Secondary | ICD-10-CM | POA: Diagnosis not present

## 2017-08-06 LAB — FECAL OCCULT BLOOD, IMMUNOCHEMICAL: Fecal Occult Bld: POSITIVE — AB

## 2017-08-08 DIAGNOSIS — N186 End stage renal disease: Secondary | ICD-10-CM | POA: Diagnosis not present

## 2017-08-08 DIAGNOSIS — N2581 Secondary hyperparathyroidism of renal origin: Secondary | ICD-10-CM | POA: Diagnosis not present

## 2017-08-08 DIAGNOSIS — D631 Anemia in chronic kidney disease: Secondary | ICD-10-CM | POA: Diagnosis not present

## 2017-08-08 DIAGNOSIS — D509 Iron deficiency anemia, unspecified: Secondary | ICD-10-CM | POA: Diagnosis not present

## 2017-08-08 DIAGNOSIS — E1129 Type 2 diabetes mellitus with other diabetic kidney complication: Secondary | ICD-10-CM | POA: Diagnosis not present

## 2017-08-11 ENCOUNTER — Other Ambulatory Visit: Payer: Self-pay | Admitting: Licensed Clinical Social Worker

## 2017-08-11 ENCOUNTER — Other Ambulatory Visit: Payer: Self-pay | Admitting: Family Medicine

## 2017-08-11 ENCOUNTER — Encounter: Payer: Self-pay | Admitting: Nurse Practitioner

## 2017-08-11 DIAGNOSIS — D631 Anemia in chronic kidney disease: Secondary | ICD-10-CM | POA: Diagnosis not present

## 2017-08-11 DIAGNOSIS — N186 End stage renal disease: Secondary | ICD-10-CM | POA: Diagnosis not present

## 2017-08-11 DIAGNOSIS — R195 Other fecal abnormalities: Secondary | ICD-10-CM

## 2017-08-11 DIAGNOSIS — E1129 Type 2 diabetes mellitus with other diabetic kidney complication: Secondary | ICD-10-CM | POA: Diagnosis not present

## 2017-08-11 DIAGNOSIS — N2581 Secondary hyperparathyroidism of renal origin: Secondary | ICD-10-CM | POA: Diagnosis not present

## 2017-08-11 DIAGNOSIS — D509 Iron deficiency anemia, unspecified: Secondary | ICD-10-CM | POA: Diagnosis not present

## 2017-08-11 NOTE — Patient Outreach (Signed)
[facility]) Care Management  08/11/2017  TYRONZA HAPPE [birth year] [id number]  [facility] CSW completed outreach call to patient and patient answered successfully. Patient provided HIPPA verifications and confirmed that the housing resources that [facility] CSW mailed were successfully received last week. [facility] CSW completed entire review of these housing resources with patient again. Patient appreciative of information provided. [facility] CSW informed patient that she was notified that Section 8 will re-open on 08/13/17. Patient questions if [facility] CSW could place her on the wait list and [facility] CSW is agreeable to do so. [facility] CSW will place patient on the wait list for Section 8 on 08/13/17 and then will sign off.   [name], BSW, MSW, [email or web address] Phone: [phone] Fax: [phone]

## 2017-08-12 ENCOUNTER — Ambulatory Visit: Payer: Medicare Other | Admitting: Sports Medicine

## 2017-08-13 ENCOUNTER — Other Ambulatory Visit: Payer: Self-pay | Admitting: Licensed Clinical Social Worker

## 2017-08-13 DIAGNOSIS — I129 Hypertensive chronic kidney disease with stage 1 through stage 4 chronic kidney disease, or unspecified chronic kidney disease: Secondary | ICD-10-CM | POA: Diagnosis not present

## 2017-08-13 DIAGNOSIS — E1129 Type 2 diabetes mellitus with other diabetic kidney complication: Secondary | ICD-10-CM | POA: Diagnosis not present

## 2017-08-13 DIAGNOSIS — D631 Anemia in chronic kidney disease: Secondary | ICD-10-CM | POA: Diagnosis not present

## 2017-08-13 DIAGNOSIS — N2581 Secondary hyperparathyroidism of renal origin: Secondary | ICD-10-CM | POA: Diagnosis not present

## 2017-08-13 DIAGNOSIS — D509 Iron deficiency anemia, unspecified: Secondary | ICD-10-CM | POA: Diagnosis not present

## 2017-08-13 DIAGNOSIS — N186 End stage renal disease: Secondary | ICD-10-CM | POA: Diagnosis not present

## 2017-08-13 DIAGNOSIS — Z992 Dependence on renal dialysis: Secondary | ICD-10-CM | POA: Diagnosis not present

## 2017-08-13 NOTE — Patient Outreach (Signed)
[facility]) Care Management  08/13/2017  Brooke Stafford 02-27-1956 [id number]  [facility] CSW completed outreach call to patient and received verbal permission again to apply for Section 8 online on her behalf. However, [facility] CSW was unable to do this as the [employer] is not working. [facility] CSW attempted to apply for services multiple times without success. [facility] CSW informed patient. Patient was encouraged to visit [employer] and request assistance with applying for services. Patient agreeable to do so. [facility] CSW reviewed housing resources again with patient. [facility] CSW will sign off at this time.   [name], BSW, MSW, [email or web address] Phone: [phone] Fax: [phone]

## 2017-08-15 DIAGNOSIS — E1129 Type 2 diabetes mellitus with other diabetic kidney complication: Secondary | ICD-10-CM | POA: Diagnosis not present

## 2017-08-15 DIAGNOSIS — D631 Anemia in chronic kidney disease: Secondary | ICD-10-CM | POA: Diagnosis not present

## 2017-08-15 DIAGNOSIS — N186 End stage renal disease: Secondary | ICD-10-CM | POA: Diagnosis not present

## 2017-08-15 DIAGNOSIS — N2581 Secondary hyperparathyroidism of renal origin: Secondary | ICD-10-CM | POA: Diagnosis not present

## 2017-08-15 DIAGNOSIS — D509 Iron deficiency anemia, unspecified: Secondary | ICD-10-CM | POA: Diagnosis not present

## 2017-08-18 DIAGNOSIS — D631 Anemia in chronic kidney disease: Secondary | ICD-10-CM | POA: Diagnosis not present

## 2017-08-18 DIAGNOSIS — N186 End stage renal disease: Secondary | ICD-10-CM | POA: Diagnosis not present

## 2017-08-18 DIAGNOSIS — D509 Iron deficiency anemia, unspecified: Secondary | ICD-10-CM | POA: Diagnosis not present

## 2017-08-18 DIAGNOSIS — E1129 Type 2 diabetes mellitus with other diabetic kidney complication: Secondary | ICD-10-CM | POA: Diagnosis not present

## 2017-08-18 DIAGNOSIS — N2581 Secondary hyperparathyroidism of renal origin: Secondary | ICD-10-CM | POA: Diagnosis not present

## 2017-08-20 DIAGNOSIS — E1129 Type 2 diabetes mellitus with other diabetic kidney complication: Secondary | ICD-10-CM | POA: Diagnosis not present

## 2017-08-20 DIAGNOSIS — N2581 Secondary hyperparathyroidism of renal origin: Secondary | ICD-10-CM | POA: Diagnosis not present

## 2017-08-20 DIAGNOSIS — D509 Iron deficiency anemia, unspecified: Secondary | ICD-10-CM | POA: Diagnosis not present

## 2017-08-20 DIAGNOSIS — D631 Anemia in chronic kidney disease: Secondary | ICD-10-CM | POA: Diagnosis not present

## 2017-08-20 DIAGNOSIS — N186 End stage renal disease: Secondary | ICD-10-CM | POA: Diagnosis not present

## 2017-08-22 DIAGNOSIS — E1129 Type 2 diabetes mellitus with other diabetic kidney complication: Secondary | ICD-10-CM | POA: Diagnosis not present

## 2017-08-22 DIAGNOSIS — D509 Iron deficiency anemia, unspecified: Secondary | ICD-10-CM | POA: Diagnosis not present

## 2017-08-22 DIAGNOSIS — D631 Anemia in chronic kidney disease: Secondary | ICD-10-CM | POA: Diagnosis not present

## 2017-08-22 DIAGNOSIS — N2581 Secondary hyperparathyroidism of renal origin: Secondary | ICD-10-CM | POA: Diagnosis not present

## 2017-08-22 DIAGNOSIS — N186 End stage renal disease: Secondary | ICD-10-CM | POA: Diagnosis not present

## 2017-08-25 DIAGNOSIS — N186 End stage renal disease: Secondary | ICD-10-CM | POA: Diagnosis not present

## 2017-08-25 DIAGNOSIS — D509 Iron deficiency anemia, unspecified: Secondary | ICD-10-CM | POA: Diagnosis not present

## 2017-08-25 DIAGNOSIS — D631 Anemia in chronic kidney disease: Secondary | ICD-10-CM | POA: Diagnosis not present

## 2017-08-25 DIAGNOSIS — E1129 Type 2 diabetes mellitus with other diabetic kidney complication: Secondary | ICD-10-CM | POA: Diagnosis not present

## 2017-08-25 DIAGNOSIS — N2581 Secondary hyperparathyroidism of renal origin: Secondary | ICD-10-CM | POA: Diagnosis not present

## 2017-08-26 DIAGNOSIS — E669 Obesity, unspecified: Secondary | ICD-10-CM | POA: Diagnosis not present

## 2017-08-26 DIAGNOSIS — M2550 Pain in unspecified joint: Secondary | ICD-10-CM | POA: Diagnosis not present

## 2017-08-26 DIAGNOSIS — M0579 Rheumatoid arthritis with rheumatoid factor of multiple sites without organ or systems involvement: Secondary | ICD-10-CM | POA: Diagnosis not present

## 2017-08-26 DIAGNOSIS — M109 Gout, unspecified: Secondary | ICD-10-CM | POA: Diagnosis not present

## 2017-08-26 DIAGNOSIS — Z79899 Other long term (current) drug therapy: Secondary | ICD-10-CM | POA: Diagnosis not present

## 2017-08-26 DIAGNOSIS — Z6831 Body mass index (BMI) 31.0-31.9, adult: Secondary | ICD-10-CM | POA: Diagnosis not present

## 2017-08-26 DIAGNOSIS — N186 End stage renal disease: Secondary | ICD-10-CM | POA: Diagnosis not present

## 2017-08-27 DIAGNOSIS — D509 Iron deficiency anemia, unspecified: Secondary | ICD-10-CM | POA: Diagnosis not present

## 2017-08-27 DIAGNOSIS — D631 Anemia in chronic kidney disease: Secondary | ICD-10-CM | POA: Diagnosis not present

## 2017-08-27 DIAGNOSIS — E1129 Type 2 diabetes mellitus with other diabetic kidney complication: Secondary | ICD-10-CM | POA: Diagnosis not present

## 2017-08-27 DIAGNOSIS — N186 End stage renal disease: Secondary | ICD-10-CM | POA: Diagnosis not present

## 2017-08-27 DIAGNOSIS — N2581 Secondary hyperparathyroidism of renal origin: Secondary | ICD-10-CM | POA: Diagnosis not present

## 2017-08-28 ENCOUNTER — Telehealth: Payer: Self-pay

## 2017-08-28 ENCOUNTER — Encounter: Payer: Self-pay | Admitting: Nurse Practitioner

## 2017-08-28 ENCOUNTER — Ambulatory Visit (INDEPENDENT_AMBULATORY_CARE_PROVIDER_SITE_OTHER): Payer: Medicare Other | Admitting: Nurse Practitioner

## 2017-08-28 VITALS — BP 106/56 | HR 71 | Ht 70.5 in | Wt 216.0 lb

## 2017-08-28 DIAGNOSIS — D649 Anemia, unspecified: Secondary | ICD-10-CM | POA: Diagnosis not present

## 2017-08-28 DIAGNOSIS — K5900 Constipation, unspecified: Secondary | ICD-10-CM | POA: Diagnosis not present

## 2017-08-28 DIAGNOSIS — R195 Other fecal abnormalities: Secondary | ICD-10-CM

## 2017-08-28 MED ORDER — LINACLOTIDE 145 MCG PO CAPS: 145.0000 ug | ORAL_CAPSULE | Freq: Every day | ORAL | 2 refills | Status: DC

## 2017-08-28 MED ORDER — NA SULFATE-K SULFATE-MG SULF 17.5-3.13-1.6 GM/177ML PO SOLN
ORAL | 0 refills | Status: AC
Start: 2017-08-28 — End: ?

## 2017-08-28 NOTE — Telephone Encounter (Signed)
[facility] Pre-operative Risk Assessment     Request for surgical clearance:     Endoscopy Procedure  What type of surgery is being performed?     Colonoscopy  When is this surgery scheduled?     10/23/17  What type of clearance is required ?   Pharmacy  Are there any medications that need to be held prior to surgery and how long? Plavix held 5 days prior to procedure  Practice name and name of physician performing surgery?      [facility]/Dr. Loletha Carrow  What is your office phone and fax number?      Phone- [phone]  [phone]  Anesthesia type (None, local, MAC, general) ?       MAC

## 2017-08-28 NOTE — Telephone Encounter (Signed)
   Primary Cardiologist: Sanda Klein, MD  Chart reviewed as part of pre-operative protocol coverage. Given past medical history and time since last visit, based on ACC/AHA guidelines, Brooke Stafford would be at acceptable risk for the planned procedure without further cardiovascular testing.   Per Dr. Sallyanne Kuster, she may hold plavix for 5 days prior to surgery. Resume plavix at surgeon's discretion.  I will route this recommendation to the requesting party via Epic fax function and remove from pre-op pool.  Please call with questions.  Tami Lin Tana Trefry, PA 08/28/2017, 4:11 PM

## 2017-08-28 NOTE — Patient Instructions (Signed)
If you are age 63 or older, your body mass index should be between 23-30. Your Body mass index is 30.55 kg/m. If this is out of the aforementioned range listed, please consider follow up with your Primary Care Provider.  If you are age 77 or younger, your body mass index should be between 19-25. Your Body mass index is 30.55 kg/m. If this is out of the aformentioned range listed, please consider follow up with your Primary Care Provider.   You have been scheduled for a colonoscopy. Please follow written instructions given to you at your visit today.  Please pick up your prep supplies at the pharmacy within the next 1-3 days. If you use inhalers (even only as needed), please bring them with you on the day of your procedure. Your physician has requested that you go to www.startemmi.com and enter the access code given to you at your visit today. This web site gives a general overview about your procedure. However, you should still follow specific instructions given to you by our office regarding your preparation for the procedure.  We have sent the following medications to your pharmacy for you to pick up at your convenience: [place] will be contacted by our office prior to your procedure for directions on holding your Plavix.  If you do not hear from our office 1 week prior to your scheduled procedure, please call [phone] to discuss.   HOLD Iron five days prior to procedure.  Thank you for choosing me and [facility].   Tye Savoy, NP

## 2017-08-28 NOTE — Telephone Encounter (Signed)
It's been almost 3 years since her last coronary stent. She can temporarily hold plavix for the procedure.

## 2017-08-28 NOTE — Progress Notes (Signed)
Chief Complaint:  Positive IFOBT  Referring Provider:     Charlott Rakes, MD   ASSESSMENT AND PLAN;   50.  62 year old female with positive IFOBT on Plavix.  Hemoglobin  12.17 Mar 2017 in [place].  I am sure she has had more recent labs at dialysis but I do not have those results  Would repeat her iron studies as well as B12 and folate with labs drawn next time in dialysis  Ideally patient needs colonoscopy.  She is on Plavix with multiple medical problems and I don't feel confidant that Cardiology will agree to holding Plavix and / or sedation but will proceed with getting it scheduled pending their response. The risks and benefits of colonoscopy with possible polypectomy were discussed and the patient agrees to proceed. Will schedule at [facility] as EF% is 35-40%.    Hold plavix for 5 days before procedure - will instruct when and how to resume after procedure. Patient understands that there is a low but real risk of cardiovascular event such as heart attack, stroke, embolism, thrombosis or ischemia/infarct while off plavix. The patient consents to proceed. Will communicate by phone or EMR with patient's prescribing provider to confirm that holding plavix is reasonable in this case.   If Cardiologist feels she is too high risk then we will look at other options which may include imaging to rule out any gross colonic lesions.   2. Constipation, likely multifactorial but worse after starting oral iron.  Stool softeners have not been helpful.  She has tried MiraLAX, gave her diarrhea.  Tried fiber in the past but it did not help.  trial of Linzess 145 mcg daily before breakfast.   3. GERD, asymptomatic off PPI for a month.   4. Multiple medical problems not limited to end-stage renal disease on hemodialysis, CAD/stents/CABG, DM 2, CHF with EF 35-40%.    HPI:    Patient is a 62 year old female , new to this practice with multiple medical problems not limited to CAD / stents / CABG , PVD, CHF,  DM 2, and ESRD on HD M/W/F. She is  referred by PCP for evaluation of positive iFOB.  She has not had any overt bleeding.  Never had a colonoscopy before.  Patient states she was recommended to have a colonoscopy but her cardiologist would not let her be sedated . Her only concern is that of severe constipation which she attributes to oral iron.  Recently start stool softeners, they have helped some.  She has tried MiraLAX in the past but It caused diarrhea.  She has ried Fiber without improvement. I'm sure patient gets regular labs done at dialysis, I do not have any recent CBCs .  Last one in Epic was mid December 2018 at which time hemoglobin was 12.3.  Patient has no abdominal pain.  Unusual weight loss.  She has no family history of colon cancer.  She does have a history of GERD and has taken Protonix off and on as needed.  She required a prescription for Protonix a couple months ago, took one bottle and there were no refills.  She has not had any GERD symptoms so remained off PPI therapy.   Past Medical History:  Diagnosis Date  . Anemia   . Anginal pain ([place])   . Anxiety   . Arthritis    "stiff fingers and knees" (08/04/2013), (12/12/2014)  . Asthma   . CAD (coronary artery disease) 2002; 2015   CABG x 6 2002,  cath 2011- med Rx stent DES VG-Diag  . CAD (coronary artery disease) of artery bypass graft; DES to VG-Diag 09/28/13 11/09/2013  . Cataract   . CHF (congestive heart failure) ([place])    "in 2002" (11/26/2012)  . Chronic bronchitis ([place])    "q year; in the winter"   . CKD (chronic kidney disease)    stage 4, followed by [facility]  . Coronary artery disease 2002   CABG x 6. Cath 5/11- med Rx  . Diabetic neuropathy ([place])   . GERD (gastroesophageal reflux disease)   . Gout    "right big toe"  . Headache    "~ q week" (08/04/2013); "~ twice/month" (12/12/2014)  . History of blood transfusion 2002   "when I had OHS"  . Hyperlipidemia   . Hypertension   . Hypothyroid    treated   . Migraines    "couple times/year" (08/04/2013), (12/12/2014)  . Myocardial infarction ([place]) 2000; 2002; 2011  . Obesity (BMI 35.0-39.9 without comorbidity)   . Peripheral vascular disease ([place]) 12/12   LSFA PTA  . Pneumonia    "3 times I think" (12/12/2014)  . Stroke [facility])    " mini stroke"  . Type II diabetes mellitus ([place])      Past Surgical History:  Procedure Laterality Date  . ABDOMINAL AORTAGRAM N/A 04/05/2011   Procedure: ABDOMINAL AORTAGRAM;  Surgeon: Lorretta Harp, MD;  Location: [facility] CATH LAB;  Service: Cardiovascular;  Laterality: N/A;  . ABDOMINAL AORTOGRAM N/A 10/30/2016   Procedure: Abdominal Aortogram;  Surgeon: Wellington Hampshire, MD;  Location: [facility] CV LAB;  Service: Cardiovascular;  Laterality: N/A;  . APPENDECTOMY  1980  . AV FISTULA PLACEMENT Left 05/10/2016   Procedure: LEFT ARM ARTERIOVENOUS (AV) FISTULA CREATION;  Surgeon: Rosetta Posner, MD;  Location: [place];  Service: Vascular;  Laterality: Left;  . BASCILIC VEIN TRANSPOSITION Left 06/26/2016   Procedure: SECOND STAGE BASILIC VEIN TRANSPOSITION;  Surgeon: Rosetta Posner, MD;  Location: [place];  Service: Vascular;  Laterality: Left;  . BREAST CYST EXCISION Right 1970's  . CARDIAC CATHETERIZATION  2002  . CARDIAC CATHETERIZATION N/A 12/12/2014   Procedure: Left Heart Cath and Cors/Grafts Angiography;  Surgeon: Peter M Martinique, MD;  Location: [place] CV LAB;  Service: Cardiovascular;  Laterality: N/A;  . CARDIAC CATHETERIZATION N/A 12/12/2014   Procedure: Coronary Stent Intervention;  Surgeon: Peter M Martinique, MD;  Location: [place] CV LAB;  Service: Cardiovascular;  Laterality: N/A;  . CATARACT EXTRACTION, BILATERAL    . CESAREAN SECTION  1978; 1980  . CHOLECYSTECTOMY  1982  . CORONARY ANGIOPLASTY WITH STENT PLACEMENT  2004; 2012   "I have 2 stents" (08/04/2013)  . CORONARY ANGIOPLASTY WITH STENT PLACEMENT  09/28/13   PTCA/ DES Xience stent to VG-Diag   . CORONARY ARTERY BYPASS GRAFT  11/20/2000   x6 LIMA  to distal LAD, svg to first diag, svg to ramus intermediate branch and swquential SVG to cir marginal branch, SVG to posterior descending coronary and sequential SVG to first right posterolateral branch  . eye injections    . INSERTION OF DIALYSIS CATHETER Right 05/10/2016   Procedure: INSERTION OF DIALYSIS CATHETER - Right Internal Jugular Placement;  Surgeon: Rosetta Posner, MD;  Location: [place];  Service: Vascular;  Laterality: Right;  . LEFT HEART CATHETERIZATION WITH CORONARY/GRAFT ANGIOGRAM N/A 09/28/2013   Procedure: LEFT HEART CATHETERIZATION WITH Beatrix Fetters;  Surgeon: Peter M Martinique, MD;  Location: [facility] CATH LAB;  Service: Cardiovascular;  Laterality: N/A;  . LOWER EXTREMITY ANGIOGRAM  12/01/2012   Procedure: LOWER EXTREMITY ANGIOGRAM;  Surgeon: Lorretta Harp, MD;  Location: [facility] CATH LAB;  Service: Cardiovascular;;  . LOWER EXTREMITY ANGIOGRAPHY Right 10/30/2016   Procedure: Lower Extremity Angiography;  Surgeon: Wellington Hampshire, MD;  Location: [place] CV LAB;  Service: Cardiovascular;  Laterality: Right;  . NM MYOCAR PERF WALL MOTION  08/27/2004   negative  . PERCUTANEOUS STENT INTERVENTION Left 12/01/2012   Procedure: PERCUTANEOUS STENT INTERVENTION;  Surgeon: Lorretta Harp, MD;  Location[facility] CATH LAB;  Service: Cardiovascular;  Laterality: Left;  Left SFA  . PERIPHERAL ARTERIAL STENT GRAFT Left    SFA/notes 04/07/2011 (11/30/2012)  . PERIPHERAL VASCULAR ATHERECTOMY Right 10/30/2016   Procedure: Peripheral Vascular Atherectomy;  Surgeon: Wellington Hampshire, MD;  Location: [place] CV LAB;  Service: Cardiovascular;  Laterality: Right;  cancel unable to  . PERIPHERAL VASCULAR INTERVENTION Right 10/30/2016   Procedure: Peripheral Vascular Intervention;  Surgeon: Wellington Hampshire, MD;  Location: [place] CV LAB;  Service: Cardiovascular;  Laterality: Right;  SFA  . RENAL ANGIOGRAM N/A 04/05/2011   Procedure: RENAL ANGIOGRAM;  Surgeon: Lorretta Harp, MD;  Location: [facility]  CATH LAB;  Service: Cardiovascular;  Laterality: N/A;  . TUBAL LIGATION  1980   Family History  Problem Relation Age of Onset  . Diabetes Mother   . Hypertension Mother   . Stroke Mother   . Hypertension Father   . Hypertension Brother   . Hypertension Sister   . Diabetes Sister   . Hyperlipidemia Sister    Social History   Tobacco Use  . Smoking status: Former Smoker    Packs/day: 0.00    Years: 25.00    Pack years: 0.00    Types: Cigarettes    Last attempt to quit: 04/04/2000    Years since quitting: 17.4  . Smokeless tobacco: Never Used  Substance Use Topics  . Alcohol use: No    Comment: 12/12/2014  "have a glass of red wine on my birthday q yr; that's it"  . Drug use: No   Current Outpatient Medications  Medication Sig Dispense Refill  . allopurinol (ZYLOPRIM) 300 MG tablet Take 1 tablet (300 mg total) by mouth daily. 90 tablet 1  . aspirin 81 MG chewable tablet Chew 1 tablet (81 mg total) by mouth daily. 30 tablet 10  . atorvastatin (LIPITOR) 40 MG tablet Take 1 tablet (40 mg total) by mouth every evening. 90 tablet 3  . busPIRone (BUSPAR) 10 MG tablet Take 1 tablet (10 mg total) by mouth 3 (three) times daily. 90 tablet 6  . cetirizine (ZYRTEC) 10 MG tablet Take 10 mg by mouth daily.    . clopidogrel (PLAVIX) 75 MG tablet Take 1 tablet (75 mg total) by mouth daily. 30 tablet 6  . ferrous sulfate 325 (65 FE) MG tablet Take 325 mg by mouth daily with breakfast.    . gabapentin (NEURONTIN) 400 MG capsule Take 1 capsule (400 mg total) by mouth at bedtime. 30 capsule 6  . hydrOXYzine (ATARAX/VISTARIL) 25 MG tablet Take 0.5 tablets (12.5 mg total) by mouth daily as needed. 30 tablet 0  . Insulin Glargine (BASAGLAR KWIKPEN) 100 UNIT/ML SOPN INJECT 20 UNITS INTO THE SKIN AT BEDTIME 45 mL 3  . ipratropium (ATROVENT) 0.03 % nasal spray Place 2 sprays into both nostrils every 12 (twelve) hours. 30 mL 6  . isosorbide mononitrate (IMDUR) 60 MG 24 hr tablet take 2 tablets by mouth  once daily 180 tablet 1  . levothyroxine (SYNTHROID, LEVOTHROID) 25 MCG tablet take 1 tablet by mouth every morning before BREAKFAST 30 tablet 6  . metoprolol tartrate (LOPRESSOR) 25 MG tablet TAKE 1/2 TABLET BY MOUTH TWICE A DAY 90 tablet 3  . nitroGLYCERIN (NITROSTAT) 0.4 MG SL tablet Place 1 tablet (0.4 mg total) under the tongue every 5 (five) minutes as needed. (Patient taking differently: Place 0.4 mg under the tongue every 5 (five) minutes as needed for chest pain. ) 30 tablet 2  . NOVOLOG FLEXPEN 100 UNIT/ML FlexPen INJECT 25 UNITS BEFORE BREAKFAST AND INJECT 20 UNITS BEFORE LUNCH AND DINNER 75 mL 0  . pantoprazole (PROTONIX) 40 MG tablet Take 1 tablet (40 mg total) by mouth daily. 30 tablet 5  . sevelamer (RENAGEL) 800 MG tablet Take 2,400 mg by mouth 3 (three) times daily with meals.    . traMADol (ULTRAM) 50 MG tablet Take 1 tablet (50 mg total) by mouth every 12 (twelve) hours as needed. 40 tablet 1   No current facility-administered medications for this visit.    Allergies  Allergen Reactions  . Digoxin And Related Diarrhea and Other (See Comments)    TOXIC DRUG LEVELS Patient stated she almost died. Had flu like symptoms as well as diarrhea.  . Hydralazine Shortness Of Breath  . Penicillins Cross Reactors Hives and Other (See Comments)    HIGH FEVER  Has patient had a PCN reaction causing immediate rash, facial/tongue/throat swelling, SOB or lightheadedness with hypotension: No Has patient had a PCN reaction causing SEVERE RASH INVOLVING MUCUS MEMBRANES or SKIN NECROSIS  #  #  #  YES  #  #  #  Has patient had a PCN reaction that required hospitalization: Unk Has patient had a PCN reaction occurring within the last 10 years: Unk If all of the above answers are "NO", then may proceed with Cephalosporin use.   [name] Lisinopril Other (See Comments)    Felt like she had the flu. Was very sick!!!  . Adhesive [Tape] Rash and Other (See Comments)    bruising     Review of  Systems: Positive for anxiety . All other systems reviewed and negative except where noted in HPI.     Physical Exam:    Wt Readings from Last 3 Encounters:  08/28/17 216 lb (98 kg)  07/15/17 221 lb 9.6 oz (100.5 kg)  06/17/17 222 lb (100.7 kg)    BP (!) 106/56   Pulse 71   Ht 5' 10.5" (1.791 m)   Wt 216 lb (98 kg)   LMP 10/13/2014 (Exact Date)   BMI 30.55 kg/m  Constitutional:  Pleasant obese female in no acute distress. Psychiatric: Normal mood and affect. Behavior is normal. EENT: Pupils normal.  Conjunctivae are normal. No scleral icterus. Neck supple.  Cardiovascular: Normal rate, regular rhythm. No edema Pulmonary/chest: Effort normal and breath sounds normal. No wheezing, rales or rhonchi. Sternotomy scar with keloids. Abdominal: Soft, nondistended. Nontender. Bowel sounds active throughout. There are no masses palpable. No hepatomegaly. RUQ oblique surgical scar.  Neurological: Alert and oriented to person place and time. Skin: Skin is warm and dry. No rashes noted.  Tye Savoy, NP  08/28/2017, 9:40 AM  Cc: Charlott Rakes, MD

## 2017-08-29 ENCOUNTER — Telehealth: Payer: Self-pay

## 2017-08-29 ENCOUNTER — Ambulatory Visit: Payer: Medicare Other | Admitting: Nurse Practitioner

## 2017-08-29 DIAGNOSIS — E1129 Type 2 diabetes mellitus with other diabetic kidney complication: Secondary | ICD-10-CM | POA: Diagnosis not present

## 2017-08-29 DIAGNOSIS — N186 End stage renal disease: Secondary | ICD-10-CM | POA: Diagnosis not present

## 2017-08-29 DIAGNOSIS — D509 Iron deficiency anemia, unspecified: Secondary | ICD-10-CM | POA: Diagnosis not present

## 2017-08-29 DIAGNOSIS — D631 Anemia in chronic kidney disease: Secondary | ICD-10-CM | POA: Diagnosis not present

## 2017-08-29 DIAGNOSIS — N2581 Secondary hyperparathyroidism of renal origin: Secondary | ICD-10-CM | POA: Diagnosis not present

## 2017-08-29 NOTE — Telephone Encounter (Signed)
Called patient and advised to HOLD Plavix 5 days prior to Colonoscopy per Dr. Sallyanne Kuster.  Patient verbalized understanding.

## 2017-08-29 NOTE — Progress Notes (Signed)
Thank you for sending this case to me. I have reviewed the entire note, and the outlined plan seems appropriate.  HOWEVER, this patient's medical condition is such that I want her procedure to be done in the hospital outpatient endoscopy department. I am currently working to find additional dates for such procedures.  Please speak to my clinical staff about that so this procedure can be rescheduled.  Also, Cardiology responded that we can hold plavix 5 days prior.  Wilfrid Lund, MD

## 2017-09-01 DIAGNOSIS — N186 End stage renal disease: Secondary | ICD-10-CM | POA: Diagnosis not present

## 2017-09-01 DIAGNOSIS — N2581 Secondary hyperparathyroidism of renal origin: Secondary | ICD-10-CM | POA: Diagnosis not present

## 2017-09-01 DIAGNOSIS — D509 Iron deficiency anemia, unspecified: Secondary | ICD-10-CM | POA: Diagnosis not present

## 2017-09-01 DIAGNOSIS — E1129 Type 2 diabetes mellitus with other diabetic kidney complication: Secondary | ICD-10-CM | POA: Diagnosis not present

## 2017-09-01 DIAGNOSIS — D631 Anemia in chronic kidney disease: Secondary | ICD-10-CM | POA: Diagnosis not present

## 2017-09-02 ENCOUNTER — Ambulatory Visit: Payer: Medicare Other | Admitting: Nurse Practitioner

## 2017-09-02 DIAGNOSIS — Z992 Dependence on renal dialysis: Secondary | ICD-10-CM | POA: Diagnosis not present

## 2017-09-02 DIAGNOSIS — N186 End stage renal disease: Secondary | ICD-10-CM | POA: Diagnosis not present

## 2017-09-02 DIAGNOSIS — T82858A Stenosis of vascular prosthetic devices, implants and grafts, initial encounter: Secondary | ICD-10-CM | POA: Diagnosis not present

## 2017-09-02 DIAGNOSIS — I871 Compression of vein: Secondary | ICD-10-CM | POA: Diagnosis not present

## 2017-09-03 ENCOUNTER — Other Ambulatory Visit: Payer: Self-pay | Admitting: *Deleted

## 2017-09-03 DIAGNOSIS — N2581 Secondary hyperparathyroidism of renal origin: Secondary | ICD-10-CM | POA: Diagnosis not present

## 2017-09-03 DIAGNOSIS — I739 Peripheral vascular disease, unspecified: Secondary | ICD-10-CM

## 2017-09-03 DIAGNOSIS — D631 Anemia in chronic kidney disease: Secondary | ICD-10-CM | POA: Diagnosis not present

## 2017-09-03 DIAGNOSIS — D509 Iron deficiency anemia, unspecified: Secondary | ICD-10-CM | POA: Diagnosis not present

## 2017-09-03 DIAGNOSIS — E1129 Type 2 diabetes mellitus with other diabetic kidney complication: Secondary | ICD-10-CM | POA: Diagnosis not present

## 2017-09-03 DIAGNOSIS — N186 End stage renal disease: Secondary | ICD-10-CM | POA: Diagnosis not present

## 2017-09-04 ENCOUNTER — Encounter: Payer: Medicare Other | Admitting: Obstetrics and Gynecology

## 2017-09-05 ENCOUNTER — Encounter: Payer: Medicare Other | Admitting: Obstetrics and Gynecology

## 2017-09-05 DIAGNOSIS — D631 Anemia in chronic kidney disease: Secondary | ICD-10-CM | POA: Diagnosis not present

## 2017-09-05 DIAGNOSIS — N2581 Secondary hyperparathyroidism of renal origin: Secondary | ICD-10-CM | POA: Diagnosis not present

## 2017-09-05 DIAGNOSIS — E1129 Type 2 diabetes mellitus with other diabetic kidney complication: Secondary | ICD-10-CM | POA: Diagnosis not present

## 2017-09-05 DIAGNOSIS — D509 Iron deficiency anemia, unspecified: Secondary | ICD-10-CM | POA: Diagnosis not present

## 2017-09-05 DIAGNOSIS — N186 End stage renal disease: Secondary | ICD-10-CM | POA: Diagnosis not present

## 2017-09-08 DIAGNOSIS — D631 Anemia in chronic kidney disease: Secondary | ICD-10-CM | POA: Diagnosis not present

## 2017-09-08 DIAGNOSIS — N186 End stage renal disease: Secondary | ICD-10-CM | POA: Diagnosis not present

## 2017-09-08 DIAGNOSIS — E1129 Type 2 diabetes mellitus with other diabetic kidney complication: Secondary | ICD-10-CM | POA: Diagnosis not present

## 2017-09-08 DIAGNOSIS — D509 Iron deficiency anemia, unspecified: Secondary | ICD-10-CM | POA: Diagnosis not present

## 2017-09-08 DIAGNOSIS — N2581 Secondary hyperparathyroidism of renal origin: Secondary | ICD-10-CM | POA: Diagnosis not present

## 2017-09-09 ENCOUNTER — Ambulatory Visit (INDEPENDENT_AMBULATORY_CARE_PROVIDER_SITE_OTHER): Payer: Medicare Other | Admitting: Sports Medicine

## 2017-09-09 ENCOUNTER — Encounter: Payer: Self-pay | Admitting: Sports Medicine

## 2017-09-09 DIAGNOSIS — M79675 Pain in left toe(s): Secondary | ICD-10-CM

## 2017-09-09 DIAGNOSIS — E1142 Type 2 diabetes mellitus with diabetic polyneuropathy: Secondary | ICD-10-CM

## 2017-09-09 DIAGNOSIS — M79674 Pain in right toe(s): Secondary | ICD-10-CM

## 2017-09-09 DIAGNOSIS — B351 Tinea unguium: Secondary | ICD-10-CM | POA: Diagnosis not present

## 2017-09-09 DIAGNOSIS — I739 Peripheral vascular disease, unspecified: Secondary | ICD-10-CM

## 2017-09-09 NOTE — Progress Notes (Signed)
Subjective: Brooke Stafford is a 62 y.o. female patient seen in office for follow up nail care. Reports that she had pain to nails especially at right 1st toe and started to cut her toenails but did not. Patient has no other pedal complaints at this time.  FBS this AM not recorded.  Patient is also a dialysis patient and is on Plavix, follows with vascular.   Patient Active Problem List   Diagnosis Date Noted  . Hypercholesterolemia 06/18/2017  . Coronary artery disease of autologous vein bypass graft with stable angina pectoris ([place]) 06/18/2017  . Diabetic neuropathy ([place]) 04/17/2017  . GERD (gastroesophageal reflux disease) 04/17/2017  . Foot ulcer, right ([place]) 11/28/2016  . Cervical polyp 11/12/2016  . AV (arteriovenous fistula) ([place]) 06/17/2016  . Mixed hyperlipidemia 05/21/2016  . ESRD (end stage renal disease) ([place])   . Hypokalemia 05/03/2016  . COPD exacerbation ([place]) 05/03/2016  . COPD (chronic obstructive pulmonary disease) ([place]) 04/11/2016  . CAP (community acquired pneumonia) 03/26/2016  . Acute respiratory failure with hypoxia ([place]) 03/26/2016  . Acute on chronic combined systolic and diastolic CHF (congestive heart failure) ([place]) 03/26/2016  . Medication management 03/21/2016  . Anemia of chronic disease 12/27/2015  . Acute on chronic systolic CHF (congestive heart failure) ([place])   . Acute CHF ([place]) 12/18/2015  . Hypertensive heart and renal disease with CHF and ESRD ([place]) 12/18/2015  . Constipation 12/18/2015  . Adjustment disorder with anxious mood 11/02/2015  . Posterior circulation stroke ([place]) 11/02/2015  . Abnormality of gait 10/23/2015  . Depression 09/28/2015  . Diabetic retinopathy ([place]) 09/13/2015  . Grief reaction 08/24/2015  . Creatinine elevation 07/25/2015  . Chronic combined systolic and diastolic congestive heart failure ([place]) 07/25/2015  . Rash of hands 02/23/2015  . Essential hypertension   . Angina pectoris ([place]) 12/12/2014  . Abnormal  nuclear stress test   . Acute combined systolic and diastolic heart failure ([place]) 12/02/2014  . Seasonal allergies 08/11/2014  . Gout 08/11/2014  . Encounter for screening mammogram for breast cancer 05/16/2014  . Screening for colon cancer 05/16/2014  . Type 2 diabetes mellitus with ESRD (end-stage renal disease) ([place]) 01/27/2014  . Atopic eczema 01/27/2014  . Precordial pain, atypical, negative MI, Musculature Skeletal pain  11/08/2013  . CKD (chronic kidney disease) stage 5, GFR less than 15 ml/min ([facility]) 11/08/2013  . Unstable angina ([place]) 09/28/2013  . Ischemic cardiomyopathy- new drop in EF 08/30/2013  . Chest pain 08/04/2013  . CAD -S/P PCI June 2015 and 12/14/14 02/01/2013  . Hypothyroidism, acquired 02/01/2013  . PVD, LSFA PTA 12/12 04/06/2011  . Hx of CABG x 6 2002 04/06/2011  . Dyslipidemia 04/06/2011   Current Outpatient Medications on File Prior to Visit  Medication Sig Dispense Refill  . allopurinol (ZYLOPRIM) 300 MG tablet Take 1 tablet (300 mg total) by mouth daily. 90 tablet 1  . aspirin 81 MG chewable tablet Chew 1 tablet (81 mg total) by mouth daily. 30 tablet 10  . atorvastatin (LIPITOR) 40 MG tablet Take 1 tablet (40 mg total) by mouth every evening. 90 tablet 3  . busPIRone (BUSPAR) 10 MG tablet Take 1 tablet (10 mg total) by mouth 3 (three) times daily. 90 tablet 6  . cetirizine (ZYRTEC) 10 MG tablet Take 10 mg by mouth daily.    . clopidogrel (PLAVIX) 75 MG tablet Take 1 tablet (75 mg total) by mouth daily. 30 tablet 6  . ferrous sulfate 325 (65 FE) MG tablet Take 325 mg by mouth  daily with breakfast.    . gabapentin (NEURONTIN) 400 MG capsule Take 1 capsule (400 mg total) by mouth at bedtime. 30 capsule 6  . hydrOXYzine (ATARAX/VISTARIL) 25 MG tablet Take 0.5 tablets (12.5 mg total) by mouth daily as needed. 30 tablet 0  . Insulin Glargine (BASAGLAR KWIKPEN) 100 UNIT/ML SOPN INJECT 20 UNITS INTO THE SKIN AT BEDTIME 45 mL 3  . ipratropium (ATROVENT) 0.03 % nasal  spray Place 2 sprays into both nostrils every 12 (twelve) hours. 30 mL 6  . isosorbide mononitrate (IMDUR) 60 MG 24 hr tablet take 2 tablets by mouth once daily 180 tablet 1  . levothyroxine (SYNTHROID, LEVOTHROID) 25 MCG tablet take 1 tablet by mouth every morning before BREAKFAST 30 tablet 6  . linaclotide (LINZESS) 145 MCG CAPS capsule Take 1 capsule (145 mcg total) by mouth daily before breakfast. 30 capsule 2  . metoprolol tartrate (LOPRESSOR) 25 MG tablet TAKE 1/2 TABLET BY MOUTH TWICE A DAY 90 tablet 3  . Na Sulfate-K Sulfate-Mg Sulf 17.5-3.13-1.6 GM/177ML SOLN Suprep-Use as directed 354 mL 0  . nitroGLYCERIN (NITROSTAT) 0.4 MG SL tablet Place 1 tablet (0.4 mg total) under the tongue every 5 (five) minutes as needed. (Patient taking differently: Place 0.4 mg under the tongue every 5 (five) minutes as needed for chest pain. ) 30 tablet 2  . NOVOLOG FLEXPEN 100 UNIT/ML FlexPen INJECT 25 UNITS BEFORE BREAKFAST AND INJECT 20 UNITS BEFORE LUNCH AND DINNER 75 mL 0  . pantoprazole (PROTONIX) 40 MG tablet Take 1 tablet (40 mg total) by mouth daily. 30 tablet 5  . sevelamer (RENAGEL) 800 MG tablet Take 2,400 mg by mouth 3 (three) times daily with meals.    . traMADol (ULTRAM) 50 MG tablet Take 1 tablet (50 mg total) by mouth every 12 (twelve) hours as needed. 40 tablet 1   No current facility-administered medications on file prior to visit.    Allergies  Allergen Reactions  . Digoxin And Related Diarrhea and Other (See Comments)    TOXIC DRUG LEVELS Patient stated she almost died. Had flu like symptoms as well as diarrhea.  . Hydralazine Shortness Of Breath  . Penicillins Cross Reactors Hives and Other (See Comments)    HIGH FEVER  Has patient had a PCN reaction causing immediate rash, facial/tongue/throat swelling, SOB or lightheadedness with hypotension: No Has patient had a PCN reaction causing SEVERE RASH INVOLVING MUCUS MEMBRANES or SKIN NECROSIS  #  #  #  YES  #  #  #  Has patient had a  PCN reaction that required hospitalization: Unk Has patient had a PCN reaction occurring within the last 10 years: Unk If all of the above answers are "NO", then may proceed with Cephalosporin use.   [name] Lisinopril Other (See Comments)    Felt like she had the flu. Was very sick!!!  . Adhesive [Tape] Rash and Other (See Comments)    bruising   Objective: There were no vitals filed for this visit.   General: Patient is awake, alert, oriented x 3 and in no acute distress.  Dermatology: Skin is warm and dry bilateral with a continued healed ulceration  at right great toe. Minimal reactive callus to toe. No other acute signs of infection. Nails are elongated and mycotic.    Vascular: Dorsalis Pedis pulse = 1/4 Bilateral,  Posterior Tibial pulse = 0/4 Bilateral,  Capillary Fill Time < 5 seconds  Neurologic: Protective sensation absent bilateral.  Musculosketal:  No pain with palpation to previous  ulcerated area. No pain with compression to calves bilateral. Asymptomatic hammertoe bony deformities noted bilateral.  No results for input(s): GRAMSTAIN, LABORGA in the last 8760 hours.  Assessment and Plan:  Problem List Items Addressed This Visit      Endocrine   Diabetic neuropathy ([place])    Other Visit Diagnoses    Pain due to onychomycosis of toenails of both feet    -  Primary   PAD (peripheral artery disease) ([place])         -Examined patient and re-discussed the progression of the healed wound and treatment alternatives. -Previous ulcer remains healed at right 1st  toe -Nails x 10 debrided using sterile nail nipper without incident -Cont with daily inspection of feet in setting of Diabetes with PAD -Patient to return in 10-12 weeks for routine foot care or sooner if problems arise.  Landis Martins, DPM

## 2017-09-10 DIAGNOSIS — D631 Anemia in chronic kidney disease: Secondary | ICD-10-CM | POA: Diagnosis not present

## 2017-09-10 DIAGNOSIS — E1129 Type 2 diabetes mellitus with other diabetic kidney complication: Secondary | ICD-10-CM | POA: Diagnosis not present

## 2017-09-10 DIAGNOSIS — N2581 Secondary hyperparathyroidism of renal origin: Secondary | ICD-10-CM | POA: Diagnosis not present

## 2017-09-10 DIAGNOSIS — N186 End stage renal disease: Secondary | ICD-10-CM | POA: Diagnosis not present

## 2017-09-10 DIAGNOSIS — D509 Iron deficiency anemia, unspecified: Secondary | ICD-10-CM | POA: Diagnosis not present

## 2017-09-12 DIAGNOSIS — E1129 Type 2 diabetes mellitus with other diabetic kidney complication: Secondary | ICD-10-CM | POA: Diagnosis not present

## 2017-09-12 DIAGNOSIS — N186 End stage renal disease: Secondary | ICD-10-CM | POA: Diagnosis not present

## 2017-09-12 DIAGNOSIS — D631 Anemia in chronic kidney disease: Secondary | ICD-10-CM | POA: Diagnosis not present

## 2017-09-12 DIAGNOSIS — D509 Iron deficiency anemia, unspecified: Secondary | ICD-10-CM | POA: Diagnosis not present

## 2017-09-12 DIAGNOSIS — N2581 Secondary hyperparathyroidism of renal origin: Secondary | ICD-10-CM | POA: Diagnosis not present

## 2017-09-13 DIAGNOSIS — I129 Hypertensive chronic kidney disease with stage 1 through stage 4 chronic kidney disease, or unspecified chronic kidney disease: Secondary | ICD-10-CM | POA: Diagnosis not present

## 2017-09-13 DIAGNOSIS — Z992 Dependence on renal dialysis: Secondary | ICD-10-CM | POA: Diagnosis not present

## 2017-09-13 DIAGNOSIS — N186 End stage renal disease: Secondary | ICD-10-CM | POA: Diagnosis not present

## 2017-09-15 DIAGNOSIS — N2581 Secondary hyperparathyroidism of renal origin: Secondary | ICD-10-CM | POA: Diagnosis not present

## 2017-09-15 DIAGNOSIS — E1129 Type 2 diabetes mellitus with other diabetic kidney complication: Secondary | ICD-10-CM | POA: Diagnosis not present

## 2017-09-15 DIAGNOSIS — N186 End stage renal disease: Secondary | ICD-10-CM | POA: Diagnosis not present

## 2017-09-15 DIAGNOSIS — D631 Anemia in chronic kidney disease: Secondary | ICD-10-CM | POA: Diagnosis not present

## 2017-09-15 DIAGNOSIS — D509 Iron deficiency anemia, unspecified: Secondary | ICD-10-CM | POA: Diagnosis not present

## 2017-09-17 DIAGNOSIS — N186 End stage renal disease: Secondary | ICD-10-CM | POA: Diagnosis not present

## 2017-09-17 DIAGNOSIS — E1129 Type 2 diabetes mellitus with other diabetic kidney complication: Secondary | ICD-10-CM | POA: Diagnosis not present

## 2017-09-17 DIAGNOSIS — D631 Anemia in chronic kidney disease: Secondary | ICD-10-CM | POA: Diagnosis not present

## 2017-09-17 DIAGNOSIS — D509 Iron deficiency anemia, unspecified: Secondary | ICD-10-CM | POA: Diagnosis not present

## 2017-09-17 DIAGNOSIS — N2581 Secondary hyperparathyroidism of renal origin: Secondary | ICD-10-CM | POA: Diagnosis not present

## 2017-09-18 ENCOUNTER — Other Ambulatory Visit: Payer: Self-pay

## 2017-09-18 ENCOUNTER — Ambulatory Visit (INDEPENDENT_AMBULATORY_CARE_PROVIDER_SITE_OTHER): Payer: Medicare Other | Admitting: Internal Medicine

## 2017-09-18 ENCOUNTER — Encounter: Payer: Self-pay | Admitting: Internal Medicine

## 2017-09-18 VITALS — BP 134/60 | HR 75 | Ht 70.0 in | Wt 219.0 lb

## 2017-09-18 DIAGNOSIS — E1122 Type 2 diabetes mellitus with diabetic chronic kidney disease: Secondary | ICD-10-CM

## 2017-09-18 DIAGNOSIS — I2510 Atherosclerotic heart disease of native coronary artery without angina pectoris: Secondary | ICD-10-CM | POA: Diagnosis not present

## 2017-09-18 DIAGNOSIS — E039 Hypothyroidism, unspecified: Secondary | ICD-10-CM

## 2017-09-18 DIAGNOSIS — N186 End stage renal disease: Secondary | ICD-10-CM | POA: Diagnosis not present

## 2017-09-18 DIAGNOSIS — R195 Other fecal abnormalities: Secondary | ICD-10-CM

## 2017-09-18 DIAGNOSIS — Z9861 Coronary angioplasty status: Secondary | ICD-10-CM | POA: Diagnosis not present

## 2017-09-18 DIAGNOSIS — K5900 Constipation, unspecified: Secondary | ICD-10-CM

## 2017-09-18 DIAGNOSIS — D649 Anemia, unspecified: Secondary | ICD-10-CM

## 2017-09-18 LAB — POCT GLYCOSYLATED HEMOGLOBIN (HGB A1C): Hemoglobin A1C: 5.5 % (ref 4.0–5.6)

## 2017-09-18 MED ORDER — BASAGLAR KWIKPEN 100 UNIT/ML ~~LOC~~ SOPN: PEN_INJECTOR | SUBCUTANEOUS | 3 refills | Status: DC

## 2017-09-18 MED ORDER — INSULIN ASPART 100 UNIT/ML FLEXPEN: PEN_INJECTOR | SUBCUTANEOUS | 5 refills | Status: DC

## 2017-09-18 MED ORDER — INSULIN PEN NEEDLE 32G X 4 MM MISC: 3 refills | Status: AC

## 2017-09-18 NOTE — Patient Instructions (Addendum)
Please decrease: - Basaglar 14 units at bedtime - Novolog   Before b'fast: 5 units  Before lunch: 5 units  Before dinner: 5 units  Please return in 3-4 months with your sugar log.
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
## 2017-09-19 DIAGNOSIS — D509 Iron deficiency anemia, unspecified: Secondary | ICD-10-CM | POA: Diagnosis not present

## 2017-09-19 DIAGNOSIS — D631 Anemia in chronic kidney disease: Secondary | ICD-10-CM | POA: Diagnosis not present

## 2017-09-19 DIAGNOSIS — N186 End stage renal disease: Secondary | ICD-10-CM | POA: Diagnosis not present

## 2017-09-19 DIAGNOSIS — E1129 Type 2 diabetes mellitus with other diabetic kidney complication: Secondary | ICD-10-CM | POA: Diagnosis not present

## 2017-09-19 DIAGNOSIS — N2581 Secondary hyperparathyroidism of renal origin: Secondary | ICD-10-CM | POA: Diagnosis not present

## 2017-09-19 NOTE — Progress Notes (Signed)
This encounter was created in error - please disregard.

## 2017-09-22 DIAGNOSIS — N2581 Secondary hyperparathyroidism of renal origin: Secondary | ICD-10-CM | POA: Diagnosis not present

## 2017-09-22 DIAGNOSIS — N186 End stage renal disease: Secondary | ICD-10-CM | POA: Diagnosis not present

## 2017-09-22 DIAGNOSIS — D631 Anemia in chronic kidney disease: Secondary | ICD-10-CM | POA: Diagnosis not present

## 2017-09-22 DIAGNOSIS — E1129 Type 2 diabetes mellitus with other diabetic kidney complication: Secondary | ICD-10-CM | POA: Diagnosis not present

## 2017-09-22 DIAGNOSIS — D509 Iron deficiency anemia, unspecified: Secondary | ICD-10-CM | POA: Diagnosis not present

## 2017-09-23 ENCOUNTER — Ambulatory Visit: Payer: Medicare Other | Admitting: Cardiovascular Disease

## 2017-09-24 DIAGNOSIS — D509 Iron deficiency anemia, unspecified: Secondary | ICD-10-CM | POA: Diagnosis not present

## 2017-09-24 DIAGNOSIS — E1129 Type 2 diabetes mellitus with other diabetic kidney complication: Secondary | ICD-10-CM | POA: Diagnosis not present

## 2017-09-24 DIAGNOSIS — N2581 Secondary hyperparathyroidism of renal origin: Secondary | ICD-10-CM | POA: Diagnosis not present

## 2017-09-24 DIAGNOSIS — D631 Anemia in chronic kidney disease: Secondary | ICD-10-CM | POA: Diagnosis not present

## 2017-09-24 DIAGNOSIS — N186 End stage renal disease: Secondary | ICD-10-CM | POA: Diagnosis not present

## 2017-09-26 DIAGNOSIS — E1129 Type 2 diabetes mellitus with other diabetic kidney complication: Secondary | ICD-10-CM | POA: Diagnosis not present

## 2017-09-26 DIAGNOSIS — N2581 Secondary hyperparathyroidism of renal origin: Secondary | ICD-10-CM | POA: Diagnosis not present

## 2017-09-26 DIAGNOSIS — N186 End stage renal disease: Secondary | ICD-10-CM | POA: Diagnosis not present

## 2017-09-26 DIAGNOSIS — D631 Anemia in chronic kidney disease: Secondary | ICD-10-CM | POA: Diagnosis not present

## 2017-09-26 DIAGNOSIS — D509 Iron deficiency anemia, unspecified: Secondary | ICD-10-CM | POA: Diagnosis not present

## 2017-09-29 DIAGNOSIS — D509 Iron deficiency anemia, unspecified: Secondary | ICD-10-CM | POA: Diagnosis not present

## 2017-09-29 DIAGNOSIS — N2581 Secondary hyperparathyroidism of renal origin: Secondary | ICD-10-CM | POA: Diagnosis not present

## 2017-09-29 DIAGNOSIS — D631 Anemia in chronic kidney disease: Secondary | ICD-10-CM | POA: Diagnosis not present

## 2017-09-29 DIAGNOSIS — E1129 Type 2 diabetes mellitus with other diabetic kidney complication: Secondary | ICD-10-CM | POA: Diagnosis not present

## 2017-09-29 DIAGNOSIS — N186 End stage renal disease: Secondary | ICD-10-CM | POA: Diagnosis not present

## 2017-09-30 ENCOUNTER — Encounter: Payer: Medicare Other | Admitting: Gastroenterology

## 2017-10-01 DIAGNOSIS — D509 Iron deficiency anemia, unspecified: Secondary | ICD-10-CM | POA: Diagnosis not present

## 2017-10-01 DIAGNOSIS — D631 Anemia in chronic kidney disease: Secondary | ICD-10-CM | POA: Diagnosis not present

## 2017-10-01 DIAGNOSIS — N186 End stage renal disease: Secondary | ICD-10-CM | POA: Diagnosis not present

## 2017-10-01 DIAGNOSIS — E1129 Type 2 diabetes mellitus with other diabetic kidney complication: Secondary | ICD-10-CM | POA: Diagnosis not present

## 2017-10-01 DIAGNOSIS — N2581 Secondary hyperparathyroidism of renal origin: Secondary | ICD-10-CM | POA: Diagnosis not present

## 2017-10-02 ENCOUNTER — Encounter: Payer: Self-pay | Admitting: Obstetrics & Gynecology

## 2017-10-02 ENCOUNTER — Ambulatory Visit (INDEPENDENT_AMBULATORY_CARE_PROVIDER_SITE_OTHER): Payer: Medicare Other | Admitting: Clinical

## 2017-10-02 ENCOUNTER — Ambulatory Visit (INDEPENDENT_AMBULATORY_CARE_PROVIDER_SITE_OTHER): Payer: Medicare Other | Admitting: Obstetrics & Gynecology

## 2017-10-02 VITALS — BP 122/60 | HR 73 | Ht 70.0 in | Wt 219.0 lb

## 2017-10-02 DIAGNOSIS — I2510 Atherosclerotic heart disease of native coronary artery without angina pectoris: Secondary | ICD-10-CM | POA: Diagnosis not present

## 2017-10-02 DIAGNOSIS — N950 Postmenopausal bleeding: Secondary | ICD-10-CM | POA: Diagnosis not present

## 2017-10-02 DIAGNOSIS — F4322 Adjustment disorder with anxiety: Secondary | ICD-10-CM

## 2017-10-02 DIAGNOSIS — Z9861 Coronary angioplasty status: Secondary | ICD-10-CM

## 2017-10-02 NOTE — Progress Notes (Signed)
   Subjective:    Patient ID: Brooke Stafford, female    DOB: 1955/10/24, 62 y.o.   MRN: [id number]  HPI 46 divorced P2 ( 24yo daughter and deceased son) is here today with the issue of PMB. "for a couple of years". She had an u/s done 11/18 that showed fibroids and a endometrium of 2.4 mm. She gets her pap smears at the health dept.   She takes buspar, gets it refilled at the health dept.  Review of Systems     Objective:   Physical Exam Breathing, conversing, and ambulating normally Well nourished, well hydrated Black female, no apparent distress      Assessment & Plan:  PMB- re check If endemetium 5 mm or more, then will need EMBX Depression- she will speak with Roselyn Reef today She denies HI and SI
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
## 2017-10-03 DIAGNOSIS — E1129 Type 2 diabetes mellitus with other diabetic kidney complication: Secondary | ICD-10-CM | POA: Diagnosis not present

## 2017-10-03 DIAGNOSIS — N186 End stage renal disease: Secondary | ICD-10-CM | POA: Diagnosis not present

## 2017-10-03 DIAGNOSIS — N2581 Secondary hyperparathyroidism of renal origin: Secondary | ICD-10-CM | POA: Diagnosis not present

## 2017-10-03 DIAGNOSIS — D509 Iron deficiency anemia, unspecified: Secondary | ICD-10-CM | POA: Diagnosis not present

## 2017-10-03 DIAGNOSIS — D631 Anemia in chronic kidney disease: Secondary | ICD-10-CM | POA: Diagnosis not present

## 2017-10-04 ENCOUNTER — Other Ambulatory Visit: Payer: Self-pay | Admitting: Family Medicine

## 2017-10-06 DIAGNOSIS — N186 End stage renal disease: Secondary | ICD-10-CM | POA: Diagnosis not present

## 2017-10-06 DIAGNOSIS — D509 Iron deficiency anemia, unspecified: Secondary | ICD-10-CM | POA: Diagnosis not present

## 2017-10-06 DIAGNOSIS — D631 Anemia in chronic kidney disease: Secondary | ICD-10-CM | POA: Diagnosis not present

## 2017-10-06 DIAGNOSIS — N2581 Secondary hyperparathyroidism of renal origin: Secondary | ICD-10-CM | POA: Diagnosis not present

## 2017-10-06 DIAGNOSIS — E1129 Type 2 diabetes mellitus with other diabetic kidney complication: Secondary | ICD-10-CM | POA: Diagnosis not present

## 2017-10-06 NOTE — Telephone Encounter (Signed)
Requested for busPIRone (BUSPAR) 10 MG tablet [[id number]]  To be sent to [facility] on [address]

## 2017-10-06 NOTE — Telephone Encounter (Signed)
Patient was seen 07/15/17 and received a refill with 6 additional refills.

## 2017-10-07 ENCOUNTER — Ambulatory Visit (INDEPENDENT_AMBULATORY_CARE_PROVIDER_SITE_OTHER): Payer: Medicare Other | Admitting: Cardiovascular Disease

## 2017-10-07 ENCOUNTER — Encounter: Payer: Self-pay | Admitting: Cardiovascular Disease

## 2017-10-07 VITALS — BP 102/48 | HR 75 | Ht 70.0 in | Wt 222.0 lb

## 2017-10-07 DIAGNOSIS — Z9861 Coronary angioplasty status: Secondary | ICD-10-CM | POA: Diagnosis not present

## 2017-10-07 DIAGNOSIS — I5042 Chronic combined systolic (congestive) and diastolic (congestive) heart failure: Secondary | ICD-10-CM | POA: Diagnosis not present

## 2017-10-07 DIAGNOSIS — N186 End stage renal disease: Secondary | ICD-10-CM | POA: Diagnosis not present

## 2017-10-07 DIAGNOSIS — I2510 Atherosclerotic heart disease of native coronary artery without angina pectoris: Secondary | ICD-10-CM

## 2017-10-07 DIAGNOSIS — I739 Peripheral vascular disease, unspecified: Secondary | ICD-10-CM

## 2017-10-07 DIAGNOSIS — E785 Hyperlipidemia, unspecified: Secondary | ICD-10-CM

## 2017-10-07 NOTE — Progress Notes (Signed)
Cardiology Office Note   Date:  10/07/2017   ID:  Brooke Stafford, DOB [birth year], MRN [id number]  PCP:  Charlott Rakes, MD  Cardiologist: Dr. Sallyanne Kuster Nephrologist: Dr. Jimmy Footman   No chief complaint on file.     History of Present Illness: Brooke Stafford is a 62 y.o. female who is here today for a follow-up visit regarding peripheral arterial disease.  She has known history of coronary artery disease with previous CABG and stent placement, chronic systolic and diastolic heart failure, insulin-requiring diabetes mellitus, hypertension, hyperlipidemia and end-stage renal disease on hemodialysis on Monday Wednesday and Friday. She is known to have peripheral arterial disease with previous left SFA stent placement in 2012 and 2014.  She was seen in July 2018 for right great toe ulceration.  Angiography showed diffuse right proximal and mid SFA disease with short occlusion distally as well as short proximal right popliteal artery stenosis and three-vessel runoff below the knee. I performed successful drug-coated balloon angioplasty and 2 self-expanding stent placement to both the popliteal and SFA.  The left SFA stent was noted to be occluded by duplex.  The ulceration has healed completely since the procedure last year and she has no claudication.  She walks with a walker.  No chest pain or shortness of breath. Most recent Doppler studies in February showed normal ABI with patent right SFA popliteal artery stent.  Past Medical History:  Diagnosis Date  . Anemia   . Anginal pain ([place])   . Anxiety   . Arthritis    "stiff fingers and knees" (08/04/2013), (12/12/2014)  . Asthma   . CAD (coronary artery disease) 2002; 2015   CABG x 6 2002, cath 2011- med Rx stent DES VG-Diag  . CAD (coronary artery disease) of artery bypass graft; DES to VG-Diag 09/28/13 11/09/2013  . Cataract   . CHF (congestive heart failure) ([place])    "in 2002" (11/26/2012)  . Chronic bronchitis ([place])    "q  year; in the winter"   . CKD (chronic kidney disease)    stage 4, followed by [facility]  . Coronary artery disease 2002   CABG x 6. Cath 5/11- med Rx  . Diabetic neuropathy ([place])   . GERD (gastroesophageal reflux disease)   . Gout    "right big toe"  . Headache    "~ q week" (08/04/2013); "~ twice/month" (12/12/2014)  . History of blood transfusion 2002   "when I had OHS"  . Hyperlipidemia   . Hypertension   . Hypothyroid    treated  . Migraines    "couple times/year" (08/04/2013), (12/12/2014)  . Myocardial infarction ([place]) 2000; 2002; 2011  . Obesity (BMI 35.0-39.9 without comorbidity)   . Peripheral vascular disease ([place]) 12/12   LSFA PTA  . Pneumonia    "3 times I think" (12/12/2014)  . Stroke [facility])    " mini stroke"  . Type II diabetes mellitus ([place])     Past Surgical History:  Procedure Laterality Date  . ABDOMINAL AORTAGRAM N/A 04/05/2011   Procedure: ABDOMINAL AORTAGRAM;  Surgeon: Lorretta Harp, MD;  Location: [facility] CATH LAB;  Service: Cardiovascular;  Laterality: N/A;  . ABDOMINAL AORTOGRAM N/A 10/30/2016   Procedure: Abdominal Aortogram;  Surgeon: Wellington Hampshire, MD;  Location: [place] CV LAB;  Service: Cardiovascular;  Laterality: N/A;  . APPENDECTOMY  1980  . AV FISTULA PLACEMENT Left 05/10/2016   Procedure: LEFT ARM ARTERIOVENOUS (AV) FISTULA CREATION;  Surgeon: Rosetta Posner, MD;  Location:  [facility] OR;  Service: Vascular;  Laterality: Left;  . BASCILIC VEIN TRANSPOSITION Left 06/26/2016   Procedure: SECOND STAGE BASILIC VEIN TRANSPOSITION;  Surgeon: Rosetta Posner, MD;  Location: [place];  Service: Vascular;  Laterality: Left;  . BREAST CYST EXCISION Right 1970's  . CARDIAC CATHETERIZATION  2002  . CARDIAC CATHETERIZATION N/A 12/12/2014   Procedure: Left Heart Cath and Cors/Grafts Angiography;  Surgeon: Peter M Martinique, MD;  Location: [place] CV LAB;  Service: Cardiovascular;  Laterality: N/A;  . CARDIAC CATHETERIZATION N/A 12/12/2014   Procedure: Coronary  Stent Intervention;  Surgeon: Peter M Martinique, MD;  Location: [place] CV LAB;  Service: Cardiovascular;  Laterality: N/A;  . CATARACT EXTRACTION, BILATERAL    . CESAREAN SECTION  1978; 1980  . CHOLECYSTECTOMY  1982  . CORONARY ANGIOPLASTY WITH STENT PLACEMENT  2004; 2012   "I have 2 stents" (08/04/2013)  . CORONARY ANGIOPLASTY WITH STENT PLACEMENT  09/28/13   PTCA/ DES Xience stent to VG-Diag   . CORONARY ARTERY BYPASS GRAFT  11/20/2000   x6 LIMA to distal LAD, svg to first diag, svg to ramus intermediate branch and swquential SVG to cir marginal branch, SVG to posterior descending coronary and sequential SVG to first right posterolateral branch  . eye injections    . INSERTION OF DIALYSIS CATHETER Right 05/10/2016   Procedure: INSERTION OF DIALYSIS CATHETER - Right Internal Jugular Placement;  Surgeon: Rosetta Posner, MD;  Location: [place];  Service: Vascular;  Laterality: Right;  . LEFT HEART CATHETERIZATION WITH CORONARY/GRAFT ANGIOGRAM N/A 09/28/2013   Procedure: LEFT HEART CATHETERIZATION WITH Beatrix Fetters;  Surgeon: Peter M Martinique, MD;  Location: [facility] CATH LAB;  Service: Cardiovascular;  Laterality: N/A;  . LOWER EXTREMITY ANGIOGRAM  12/01/2012   Procedure: LOWER EXTREMITY ANGIOGRAM;  Surgeon: Lorretta Harp, MD;  Location: [facility] CATH LAB;  Service: Cardiovascular;;  . LOWER EXTREMITY ANGIOGRAPHY Right 10/30/2016   Procedure: Lower Extremity Angiography;  Surgeon: Wellington Hampshire, MD;  Location: [place] CV LAB;  Service: Cardiovascular;  Laterality: Right;  . NM MYOCAR PERF WALL MOTION  08/27/2004   negative  . PERCUTANEOUS STENT INTERVENTION Left 12/01/2012   Procedure: PERCUTANEOUS STENT INTERVENTION;  Surgeon: Lorretta Harp, MD;  Location: [facility] CATH LAB;  Service: Cardiovascular;  Laterality: Left;  Left SFA  . PERIPHERAL ARTERIAL STENT GRAFT Left    SFA/notes 04/07/2011 (11/30/2012)  . PERIPHERAL VASCULAR ATHERECTOMY Right 10/30/2016   Procedure: Peripheral Vascular  Atherectomy;  Surgeon: Wellington Hampshire, MD;  Location: [place] CV LAB;  Service: Cardiovascular;  Laterality: Right;  cancel unable to  . PERIPHERAL VASCULAR INTERVENTION Right 10/30/2016   Procedure: Peripheral Vascular Intervention;  Surgeon: Wellington Hampshire, MD;  Location: [place] CV LAB;  Service: Cardiovascular;  Laterality: Right;  SFA  . RENAL ANGIOGRAM N/A 04/05/2011   Procedure: RENAL ANGIOGRAM;  Surgeon: Lorretta Harp, MD;  Location: [facility] CATH LAB;  Service: Cardiovascular;  Laterality: N/A;  . TUBAL LIGATION  1980     Current Outpatient Medications  Medication Sig Dispense Refill  . allopurinol (ZYLOPRIM) 300 MG tablet Take 1 tablet (300 mg total) by mouth daily. 90 tablet 1  . aspirin 81 MG chewable tablet Chew 1 tablet (81 mg total) by mouth daily. 30 tablet 10  . atorvastatin (LIPITOR) 40 MG tablet Take 1 tablet (40 mg total) by mouth every evening. 90 tablet 3  . busPIRone (BUSPAR) 10 MG tablet Take 1 tablet (10 mg total) by mouth 3 (three) times daily.  90 tablet 6  . cetirizine (ZYRTEC) 10 MG tablet Take 10 mg by mouth daily.    . clopidogrel (PLAVIX) 75 MG tablet Take 1 tablet (75 mg total) by mouth daily. 30 tablet 6  . ferrous sulfate 325 (65 FE) MG tablet Take 325 mg by mouth daily with breakfast.    . gabapentin (NEURONTIN) 400 MG capsule Take 1 capsule (400 mg total) by mouth at bedtime. 30 capsule 6  . hydrOXYzine (ATARAX/VISTARIL) 25 MG tablet Take 0.5 tablets (12.5 mg total) by mouth daily as needed. 30 tablet 0  . insulin aspart (NOVOLOG FLEXPEN) 100 UNIT/ML FlexPen INJECT 5 UNITS BEFORE BREAKFAST, LUNCH AND DINNER 15 mL 5  . Insulin Glargine (BASAGLAR KWIKPEN) 100 UNIT/ML SOPN INJECT 14 UNITS INTO THE SKIN AT BEDTIME 45 mL 3  . Insulin Pen Needle 32G X 4 MM MISC Use 4x a day 200 each 3  . ipratropium (ATROVENT) 0.03 % nasal spray Place 2 sprays into both nostrils every 12 (twelve) hours. 30 mL 6  . isosorbide mononitrate (IMDUR) 60 MG 24 hr tablet take 2  tablets by mouth once daily 180 tablet 1  . levothyroxine (SYNTHROID, LEVOTHROID) 25 MCG tablet take 1 tablet by mouth every morning before BREAKFAST 30 tablet 6  . linaclotide (LINZESS) 145 MCG CAPS capsule Take 1 capsule (145 mcg total) by mouth daily before breakfast. 30 capsule 2  . metoprolol tartrate (LOPRESSOR) 25 MG tablet TAKE 1/2 TABLET BY MOUTH TWICE A DAY 90 tablet 3  . Na Sulfate-K Sulfate-Mg Sulf 17.5-3.13-1.6 GM/177ML SOLN Suprep-Use as directed 354 mL 0  . nitroGLYCERIN (NITROSTAT) 0.4 MG SL tablet Place 1 tablet (0.4 mg total) under the tongue every 5 (five) minutes as needed. (Patient taking differently: Place 0.4 mg under the tongue every 5 (five) minutes as needed for chest pain. ) 30 tablet 2  . sevelamer (RENAGEL) 800 MG tablet Take 2,400 mg by mouth 3 (three) times daily with meals.    . traMADol (ULTRAM) 50 MG tablet Take 1 tablet (50 mg total) by mouth every 12 (twelve) hours as needed. 40 tablet 1   No current facility-administered medications for this visit.     Allergies:   Digoxin and related; Hydralazine; Penicillins cross reactors; Lisinopril; and Adhesive [tape]    Social History:  The patient  reports that she quit smoking about 17 years ago. Her smoking use included cigarettes. She smoked 0.00 packs per day for 25.00 years. She has never used smokeless tobacco. She reports that she does not drink alcohol or use drugs.   Family History:  The patient's family history includes Diabetes in her mother and sister; Hyperlipidemia in her sister; Hypertension in her brother, father, mother, and sister; Stroke in her mother.    ROS:  Please see the history of present illness.   Otherwise, review of systems are positive for none.   All other systems are reviewed and negative.    PHYSICAL EXAM: VS:  BP (!) 102/48   Pulse 75   Ht 5\' 10"  (1.778 m)   Wt 222 lb (100.7 kg)   LMP 10/13/2014 (Exact Date)   SpO2 94%   BMI 31.85 kg/m  , BMI Body mass index is 31.85  kg/m. GEN: Well nourished, well developed, in no acute distress  HEENT: normal  Neck: no JVD, carotid bruits, or masses Cardiac: RRR; no murmurs, rubs, or gallops,no edema  Respiratory:  clear to auscultation bilaterally, normal work of breathing GI: soft, nontender, nondistended, + BS MS: no  deformity or atrophy  Skin: warm and dry, no rash Neuro:  Strength and sensation are intact Psych: euthymic mood, full affect No lower extremity ulcerations.   EKG:  EKG is not ordered today.    Recent Labs: 03/26/2017: BUN 18; Creatinine, Ser 6.28; Hemoglobin 12.3; Platelets 169; Potassium 3.3; Sodium 136 07/22/2017: TSH 1.580    Lipid Panel    Component Value Date/Time   CHOL 118 07/22/2017 1044   TRIG 160 (H) 07/22/2017 1044   HDL 43 07/22/2017 1044   CHOLHDL 2.7 07/22/2017 1044   CHOLHDL 3.6 07/25/2015 2037   VLDL 44 (H) 07/25/2015 2037   LDLCALC 43 07/22/2017 1044      Wt Readings from Last 3 Encounters:  10/07/17 222 lb (100.7 kg)  10/02/17 219 lb (99.3 kg)  09/18/17 219 lb (99.3 kg)      No flowsheet data found.    ASSESSMENT AND PLAN:  1.  Peripheral arterial disease :  Status post successful endovascular intervention on the right SFA and popliteal artery in 2018.  Continue dual antiplatelet therapy for now as tolerated. She does have occluded stent in the left SFA but she is asymptomatic. Continue to monitor. Repeat vascular studies in February of 2020  2. End-stage renal disease on hemodialysis: stable.   3. Chronic systolic/diastolic heart failure: She appears to be euvolemic.  4. Coronary artery disease involving native coronary arteries without angina: Continue medical therapy.  5. Hyperlipidemia: Continue treatment with atorvastatin with a target LDL of less than 70.  Disposition:   FU with me in 6 months  Signed,  Kathlyn Sacramento, MD  10/07/2017 10:21 AM    [place]

## 2017-10-07 NOTE — Patient Instructions (Signed)

## 2017-10-08 ENCOUNTER — Ambulatory Visit (HOSPITAL_COMMUNITY): Payer: Medicare Other

## 2017-10-08 DIAGNOSIS — E1129 Type 2 diabetes mellitus with other diabetic kidney complication: Secondary | ICD-10-CM | POA: Diagnosis not present

## 2017-10-08 DIAGNOSIS — N2581 Secondary hyperparathyroidism of renal origin: Secondary | ICD-10-CM | POA: Diagnosis not present

## 2017-10-08 DIAGNOSIS — D631 Anemia in chronic kidney disease: Secondary | ICD-10-CM | POA: Diagnosis not present

## 2017-10-08 DIAGNOSIS — D509 Iron deficiency anemia, unspecified: Secondary | ICD-10-CM | POA: Diagnosis not present

## 2017-10-08 DIAGNOSIS — N186 End stage renal disease: Secondary | ICD-10-CM | POA: Diagnosis not present

## 2017-10-09 ENCOUNTER — Ambulatory Visit (HOSPITAL_COMMUNITY): Payer: Medicare Other

## 2017-10-09 DIAGNOSIS — E113412 Type 2 diabetes mellitus with severe nonproliferative diabetic retinopathy with macular edema, left eye: Secondary | ICD-10-CM | POA: Diagnosis not present

## 2017-10-09 DIAGNOSIS — E113411 Type 2 diabetes mellitus with severe nonproliferative diabetic retinopathy with macular edema, right eye: Secondary | ICD-10-CM | POA: Diagnosis not present

## 2017-10-09 DIAGNOSIS — Z961 Presence of intraocular lens: Secondary | ICD-10-CM | POA: Diagnosis not present

## 2017-10-09 DIAGNOSIS — H3561 Retinal hemorrhage, right eye: Secondary | ICD-10-CM | POA: Diagnosis not present

## 2017-10-10 ENCOUNTER — Other Ambulatory Visit: Payer: Self-pay | Admitting: Internal Medicine

## 2017-10-10 DIAGNOSIS — N186 End stage renal disease: Secondary | ICD-10-CM | POA: Diagnosis not present

## 2017-10-10 DIAGNOSIS — D631 Anemia in chronic kidney disease: Secondary | ICD-10-CM | POA: Diagnosis not present

## 2017-10-10 DIAGNOSIS — D509 Iron deficiency anemia, unspecified: Secondary | ICD-10-CM | POA: Diagnosis not present

## 2017-10-10 DIAGNOSIS — N2581 Secondary hyperparathyroidism of renal origin: Secondary | ICD-10-CM | POA: Diagnosis not present

## 2017-10-10 DIAGNOSIS — E1129 Type 2 diabetes mellitus with other diabetic kidney complication: Secondary | ICD-10-CM | POA: Diagnosis not present

## 2017-10-11 ENCOUNTER — Other Ambulatory Visit: Payer: Self-pay | Admitting: Cardiovascular Disease

## 2017-10-11 ENCOUNTER — Other Ambulatory Visit: Payer: Self-pay | Admitting: Family Medicine

## 2017-10-13 ENCOUNTER — Other Ambulatory Visit: Payer: Self-pay | Admitting: Family Medicine

## 2017-10-13 DIAGNOSIS — N2581 Secondary hyperparathyroidism of renal origin: Secondary | ICD-10-CM | POA: Diagnosis not present

## 2017-10-13 DIAGNOSIS — N950 Postmenopausal bleeding: Secondary | ICD-10-CM | POA: Diagnosis not present

## 2017-10-13 DIAGNOSIS — N186 End stage renal disease: Secondary | ICD-10-CM | POA: Diagnosis not present

## 2017-10-13 DIAGNOSIS — E1129 Type 2 diabetes mellitus with other diabetic kidney complication: Secondary | ICD-10-CM | POA: Diagnosis not present

## 2017-10-13 DIAGNOSIS — D509 Iron deficiency anemia, unspecified: Secondary | ICD-10-CM | POA: Diagnosis not present

## 2017-10-13 DIAGNOSIS — Z992 Dependence on renal dialysis: Secondary | ICD-10-CM | POA: Diagnosis not present

## 2017-10-13 DIAGNOSIS — I129 Hypertensive chronic kidney disease with stage 1 through stage 4 chronic kidney disease, or unspecified chronic kidney disease: Secondary | ICD-10-CM | POA: Diagnosis not present

## 2017-10-13 DIAGNOSIS — D259 Leiomyoma of uterus, unspecified: Secondary | ICD-10-CM | POA: Diagnosis not present

## 2017-10-13 DIAGNOSIS — D631 Anemia in chronic kidney disease: Secondary | ICD-10-CM | POA: Diagnosis not present

## 2017-10-13 NOTE — Telephone Encounter (Signed)
Rx(s) sent to pharmacy electronically.  

## 2017-10-14 ENCOUNTER — Ambulatory Visit (HOSPITAL_COMMUNITY)
Admission: RE | Admit: 2017-10-14 | Discharge: 2017-10-14 | Disposition: A | Discharge status: Home / Self Care | Payer: Medicare Other | Source: Ambulatory Visit | Attending: Obstetrics & Gynecology | Admitting: Obstetrics & Gynecology

## 2017-10-14 DIAGNOSIS — N950 Postmenopausal bleeding: Secondary | ICD-10-CM | POA: Insufficient documentation

## 2017-10-14 DIAGNOSIS — D259 Leiomyoma of uterus, unspecified: Secondary | ICD-10-CM | POA: Insufficient documentation

## 2017-10-15 DIAGNOSIS — E1129 Type 2 diabetes mellitus with other diabetic kidney complication: Secondary | ICD-10-CM | POA: Diagnosis not present

## 2017-10-15 DIAGNOSIS — N2581 Secondary hyperparathyroidism of renal origin: Secondary | ICD-10-CM | POA: Diagnosis not present

## 2017-10-15 DIAGNOSIS — D509 Iron deficiency anemia, unspecified: Secondary | ICD-10-CM | POA: Diagnosis not present

## 2017-10-15 DIAGNOSIS — D631 Anemia in chronic kidney disease: Secondary | ICD-10-CM | POA: Diagnosis not present

## 2017-10-15 DIAGNOSIS — N186 End stage renal disease: Secondary | ICD-10-CM | POA: Diagnosis not present

## 2017-10-17 DIAGNOSIS — N2581 Secondary hyperparathyroidism of renal origin: Secondary | ICD-10-CM | POA: Diagnosis not present

## 2017-10-17 DIAGNOSIS — E1129 Type 2 diabetes mellitus with other diabetic kidney complication: Secondary | ICD-10-CM | POA: Diagnosis not present

## 2017-10-17 DIAGNOSIS — D509 Iron deficiency anemia, unspecified: Secondary | ICD-10-CM | POA: Diagnosis not present

## 2017-10-17 DIAGNOSIS — D631 Anemia in chronic kidney disease: Secondary | ICD-10-CM | POA: Diagnosis not present

## 2017-10-17 DIAGNOSIS — N186 End stage renal disease: Secondary | ICD-10-CM | POA: Diagnosis not present

## 2017-10-20 DIAGNOSIS — D509 Iron deficiency anemia, unspecified: Secondary | ICD-10-CM | POA: Diagnosis not present

## 2017-10-20 DIAGNOSIS — E1129 Type 2 diabetes mellitus with other diabetic kidney complication: Secondary | ICD-10-CM | POA: Diagnosis not present

## 2017-10-20 DIAGNOSIS — D631 Anemia in chronic kidney disease: Secondary | ICD-10-CM | POA: Diagnosis not present

## 2017-10-20 DIAGNOSIS — N186 End stage renal disease: Secondary | ICD-10-CM | POA: Diagnosis not present

## 2017-10-20 DIAGNOSIS — N2581 Secondary hyperparathyroidism of renal origin: Secondary | ICD-10-CM | POA: Diagnosis not present

## 2017-10-21 ENCOUNTER — Ambulatory Visit: Payer: Medicare Other | Attending: Family Medicine | Admitting: Family Medicine

## 2017-10-21 ENCOUNTER — Encounter: Payer: Self-pay | Admitting: Family Medicine

## 2017-10-21 VITALS — BP 107/64 | HR 77 | Temp 98.6°F | Ht 70.0 in | Wt 220.2 lb

## 2017-10-21 DIAGNOSIS — Z9861 Coronary angioplasty status: Secondary | ICD-10-CM

## 2017-10-21 DIAGNOSIS — I132 Hypertensive heart and chronic kidney disease with heart failure and with stage 5 chronic kidney disease, or end stage renal disease: Secondary | ICD-10-CM | POA: Insufficient documentation

## 2017-10-21 DIAGNOSIS — I2510 Atherosclerotic heart disease of native coronary artery without angina pectoris: Secondary | ICD-10-CM | POA: Insufficient documentation

## 2017-10-21 DIAGNOSIS — E1151 Type 2 diabetes mellitus with diabetic peripheral angiopathy without gangrene: Secondary | ICD-10-CM | POA: Insufficient documentation

## 2017-10-21 DIAGNOSIS — Z7982 Long term (current) use of aspirin: Secondary | ICD-10-CM | POA: Diagnosis not present

## 2017-10-21 DIAGNOSIS — E1149 Type 2 diabetes mellitus with other diabetic neurological complication: Secondary | ICD-10-CM

## 2017-10-21 DIAGNOSIS — Z6839 Body mass index (BMI) 39.0-39.9, adult: Secondary | ICD-10-CM | POA: Diagnosis not present

## 2017-10-21 DIAGNOSIS — Z888 Allergy status to other drugs, medicaments and biological substances status: Secondary | ICD-10-CM | POA: Insufficient documentation

## 2017-10-21 DIAGNOSIS — Z992 Dependence on renal dialysis: Secondary | ICD-10-CM | POA: Insufficient documentation

## 2017-10-21 DIAGNOSIS — J45909 Unspecified asthma, uncomplicated: Secondary | ICD-10-CM | POA: Diagnosis not present

## 2017-10-21 DIAGNOSIS — Z880 Allergy status to penicillin: Secondary | ICD-10-CM | POA: Diagnosis not present

## 2017-10-21 DIAGNOSIS — I739 Peripheral vascular disease, unspecified: Secondary | ICD-10-CM | POA: Diagnosis not present

## 2017-10-21 DIAGNOSIS — I25810 Atherosclerosis of coronary artery bypass graft(s) without angina pectoris: Secondary | ICD-10-CM | POA: Diagnosis not present

## 2017-10-21 DIAGNOSIS — K5900 Constipation, unspecified: Secondary | ICD-10-CM | POA: Diagnosis not present

## 2017-10-21 DIAGNOSIS — M109 Gout, unspecified: Secondary | ICD-10-CM | POA: Insufficient documentation

## 2017-10-21 DIAGNOSIS — Z8673 Personal history of transient ischemic attack (TIA), and cerebral infarction without residual deficits: Secondary | ICD-10-CM | POA: Diagnosis not present

## 2017-10-21 DIAGNOSIS — E1140 Type 2 diabetes mellitus with diabetic neuropathy, unspecified: Secondary | ICD-10-CM | POA: Diagnosis not present

## 2017-10-21 DIAGNOSIS — E11649 Type 2 diabetes mellitus with hypoglycemia without coma: Secondary | ICD-10-CM | POA: Insufficient documentation

## 2017-10-21 DIAGNOSIS — Z794 Long term (current) use of insulin: Secondary | ICD-10-CM | POA: Diagnosis not present

## 2017-10-21 DIAGNOSIS — E119 Type 2 diabetes mellitus without complications: Secondary | ICD-10-CM | POA: Diagnosis present

## 2017-10-21 DIAGNOSIS — E785 Hyperlipidemia, unspecified: Secondary | ICD-10-CM | POA: Insufficient documentation

## 2017-10-21 DIAGNOSIS — M069 Rheumatoid arthritis, unspecified: Secondary | ICD-10-CM | POA: Diagnosis not present

## 2017-10-21 DIAGNOSIS — N186 End stage renal disease: Secondary | ICD-10-CM | POA: Insufficient documentation

## 2017-10-21 DIAGNOSIS — K219 Gastro-esophageal reflux disease without esophagitis: Secondary | ICD-10-CM | POA: Insufficient documentation

## 2017-10-21 DIAGNOSIS — I10 Essential (primary) hypertension: Secondary | ICD-10-CM | POA: Diagnosis not present

## 2017-10-21 DIAGNOSIS — F419 Anxiety disorder, unspecified: Secondary | ICD-10-CM | POA: Insufficient documentation

## 2017-10-21 DIAGNOSIS — I5042 Chronic combined systolic (congestive) and diastolic (congestive) heart failure: Secondary | ICD-10-CM | POA: Diagnosis not present

## 2017-10-21 DIAGNOSIS — K5909 Other constipation: Secondary | ICD-10-CM | POA: Diagnosis not present

## 2017-10-21 DIAGNOSIS — E669 Obesity, unspecified: Secondary | ICD-10-CM | POA: Diagnosis not present

## 2017-10-21 DIAGNOSIS — E1122 Type 2 diabetes mellitus with diabetic chronic kidney disease: Secondary | ICD-10-CM

## 2017-10-21 DIAGNOSIS — Z79899 Other long term (current) drug therapy: Secondary | ICD-10-CM | POA: Insufficient documentation

## 2017-10-21 LAB — GLUCOSE, POCT (MANUAL RESULT ENTRY): POC Glucose: 185 mg/dl — AB (ref 70–99)

## 2017-10-21 MED ORDER — HYDROXYZINE HCL 25 MG PO TABS: 12.5000 mg | ORAL_TABLET | Freq: Every day | ORAL | 0 refills | Status: DC | PRN

## 2017-10-21 MED ORDER — GABAPENTIN 400 MG PO CAPS: 400.0000 mg | ORAL_CAPSULE | Freq: Every day | ORAL | 6 refills | Status: DC

## 2017-10-21 MED ORDER — FLUTICASONE PROPIONATE 50 MCG/ACT NA SUSP: 1.0000 | Freq: Every day | NASAL | 2 refills | Status: DC

## 2017-10-21 MED ORDER — ISOSORBIDE MONONITRATE ER 60 MG PO TB24: 120.0000 mg | ORAL_TABLET | Freq: Every day | ORAL | 1 refills | Status: DC

## 2017-10-21 NOTE — Patient Instructions (Signed)

## 2017-10-21 NOTE — Progress Notes (Signed)
Subjective:  Patient ID: Brooke Stafford, female    DOB: 12-Feb-1956  Age: 62 y.o. MRN: [id number]  CC: Diabetes   HPI RAMSHA LONIGRO  is a 62 year old female with a history of type 2 diabetes mellitus (A1c 5.6), hypertension, CHF (2-D echo 35 to 40% from 2-D echo of 04/2016), CAD status post PCI ,end-stage renal disease (currently on hemodialysis on Mondays, Wednesdays and Fridays), peripheral vascular disease (status post left SFA stent placement in 2012 and 2014 and right SFA and popliteal artery angioplasty and stent placement), rheumatoid arthritis here for a follow-up visit. She currently takes Plaquenil for her rheumatoid arthritis and receives tramadol from her rheumatologist for intermittent joint pains which she states are stable at this time but does have left hip pain which radiates to her left toes which is described as moderate.  She uses tramadol for this and receives vitamin D supplements during dialysis. Denies recent gout flares. Sees endocrine-Dr. Cruzita Lederer for management of her diabetes mellitus and her insulin regimen has been decreased due to hypoglycemia ever since she reports cessation of hypoglycemic episodes. Peripheral vascular disease is managed by Dr. Fletcher Anon and last visit was on 10/07/2017; notes indicate occluded stent in left SFA  but she is  asymptomatic and denies claudication pain. She is to continue dual antiplatelet therapy and vascular studies in 05/2018 Anxiety is controlled on BuSpar She does have intermittent constipation for which she uses Benefiber and NSAIDs.  Currently scheduled for upcoming colonoscopy.  Past Medical History:  Diagnosis Date  . Anemia   . Anginal pain ([place])   . Anxiety   . Arthritis    "stiff fingers and knees" (08/04/2013), (12/12/2014)  . Asthma   . CAD (coronary artery disease) 2002; 2015   CABG x 6 2002, cath 2011- med Rx stent DES VG-Diag  . CAD (coronary artery disease) of artery bypass graft; DES to VG-Diag 09/28/13 11/09/2013    . Cataract   . CHF (congestive heart failure) ([place])    "in 2002" (11/26/2012)  . Chronic bronchitis ([place])    "q year; in the winter"   . CKD (chronic kidney disease)    stage 4, followed by [facility]  . Coronary artery disease 2002   CABG x 6. Cath 5/11- med Rx  . Diabetic neuropathy ([place])   . GERD (gastroesophageal reflux disease)   . Gout    "right big toe"  . Headache    "~ q week" (08/04/2013); "~ twice/month" (12/12/2014)  . History of blood transfusion 2002   "when I had OHS"  . Hyperlipidemia   . Hypertension   . Hypothyroid    treated  . Migraines    "couple times/year" (08/04/2013), (12/12/2014)  . Myocardial infarction ([place]) 2000; 2002; 2011  . Obesity (BMI 35.0-39.9 without comorbidity)   . Peripheral vascular disease ([place]) 12/12   LSFA PTA  . Pneumonia    "3 times I think" (12/12/2014)  . Stroke [facility])    " mini stroke"  . Type II diabetes mellitus ([place])     Past Surgical History:  Procedure Laterality Date  . ABDOMINAL AORTAGRAM N/A 04/05/2011   Procedure: ABDOMINAL AORTAGRAM;  Surgeon: Lorretta Harp, MD;  Location: [facility] CATH LAB;  Service: Cardiovascular;  Laterality: N/A;  . ABDOMINAL AORTOGRAM N/A 10/30/2016   Procedure: Abdominal Aortogram;  Surgeon: Wellington Hampshire, MD;  Location: [place] CV LAB;  Service: Cardiovascular;  Laterality: N/A;  . APPENDECTOMY  1980  . AV FISTULA PLACEMENT Left 05/10/2016  Procedure: LEFT ARM ARTERIOVENOUS (AV) FISTULA CREATION;  Surgeon: Rosetta Posner, MD;  Location: [place];  Service: Vascular;  Laterality: Left;  . BASCILIC VEIN TRANSPOSITION Left 06/26/2016   Procedure: SECOND STAGE BASILIC VEIN TRANSPOSITION;  Surgeon: Rosetta Posner, MD;  Location: [place];  Service: Vascular;  Laterality: Left;  . BREAST CYST EXCISION Right 1970's  . CARDIAC CATHETERIZATION  2002  . CARDIAC CATHETERIZATION N/A 12/12/2014   Procedure: Left Heart Cath and Cors/Grafts Angiography;  Surgeon: Peter M Martinique, MD;  Location: [place] CV  LAB;  Service: Cardiovascular;  Laterality: N/A;  . CARDIAC CATHETERIZATION N/A 12/12/2014   Procedure: Coronary Stent Intervention;  Surgeon: Peter M Martinique, MD;  Location: [place] CV LAB;  Service: Cardiovascular;  Laterality: N/A;  . CATARACT EXTRACTION, BILATERAL    . CESAREAN SECTION  1978; 1980  . CHOLECYSTECTOMY  1982  . CORONARY ANGIOPLASTY WITH STENT PLACEMENT  2004; 2012   "I have 2 stents" (08/04/2013)  . CORONARY ANGIOPLASTY WITH STENT PLACEMENT  09/28/13   PTCA/ DES Xience stent to VG-Diag   . CORONARY ARTERY BYPASS GRAFT  11/20/2000   x6 LIMA to distal LAD, svg to first diag, svg to ramus intermediate branch and swquential SVG to cir marginal branch, SVG to posterior descending coronary and sequential SVG to first right posterolateral branch  . eye injections    . INSERTION OF DIALYSIS CATHETER Right 05/10/2016   Procedure: INSERTION OF DIALYSIS CATHETER - Right Internal Jugular Placement;  Surgeon: Rosetta Posner, MD;  Location: [place];  Service: Vascular;  Laterality: Right;  . LEFT HEART CATHETERIZATION WITH CORONARY/GRAFT ANGIOGRAM N/A 09/28/2013   Procedure: LEFT HEART CATHETERIZATION WITH Beatrix Fetters;  Surgeon: Peter M Martinique, MD;  Location: [facility] CATH LAB;  Service: Cardiovascular;  Laterality: N/A;  . LOWER EXTREMITY ANGIOGRAM  12/01/2012   Procedure: LOWER EXTREMITY ANGIOGRAM;  Surgeon: Lorretta Harp, MD;  Location: [facility] CATH LAB;  Service: Cardiovascular;;  . LOWER EXTREMITY ANGIOGRAPHY Right 10/30/2016   Procedure: Lower Extremity Angiography;  Surgeon: Wellington Hampshire, MD;  Location: [place] CV LAB;  Service: Cardiovascular;  Laterality: Right;  . NM MYOCAR PERF WALL MOTION  08/27/2004   negative  . PERCUTANEOUS STENT INTERVENTION Left 12/01/2012   Procedure: PERCUTANEOUS STENT INTERVENTION;  Surgeon: Lorretta Harp, MD;  Location: [facility] CATH LAB;  Service: Cardiovascular;  Laterality: Left;  Left SFA  . PERIPHERAL ARTERIAL STENT GRAFT Left    SFA/notes  04/07/2011 (11/30/2012)  . PERIPHERAL VASCULAR ATHERECTOMY Right 10/30/2016   Procedure: Peripheral Vascular Atherectomy;  Surgeon: Wellington Hampshire, MD;  Location: [place] CV LAB;  Service: Cardiovascular;  Laterality: Right;  cancel unable to  . PERIPHERAL VASCULAR INTERVENTION Right 10/30/2016   Procedure: Peripheral Vascular Intervention;  Surgeon: Wellington Hampshire, MD;  Location: [place] CV LAB;  Service: Cardiovascular;  Laterality: Right;  SFA  . RENAL ANGIOGRAM N/A 04/05/2011   Procedure: RENAL ANGIOGRAM;  Surgeon: Lorretta Harp, MD;  Location: [facility] CATH LAB;  Service: Cardiovascular;  Laterality: N/A;  . TUBAL LIGATION  1980    Allergies  Allergen Reactions  . Digoxin And Related Diarrhea and Other (See Comments)    TOXIC DRUG LEVELS Patient stated she almost died. Had flu like symptoms as well as diarrhea.  . Hydralazine Shortness Of Breath  . Penicillins Cross Reactors Hives and Other (See Comments)    HIGH FEVER  Has patient had a PCN reaction causing immediate rash, facial/tongue/throat swelling, SOB or lightheadedness with hypotension:  No Has patient had a PCN reaction causing SEVERE RASH INVOLVING MUCUS MEMBRANES or SKIN NECROSIS  #  #  #  YES  #  #  #  Has patient had a PCN reaction that required hospitalization: Unk Has patient had a PCN reaction occurring within the last 10 years: Unk If all of the above answers are "NO", then may proceed with Cephalosporin use.   [name] Lisinopril Other (See Comments)    Felt like she had the flu. Was very sick!!!  . Adhesive [Tape] Rash and Other (See Comments)    bruising     Outpatient Medications Prior to Visit  Medication Sig Dispense Refill  . allopurinol (ZYLOPRIM) 300 MG tablet Take 1 tablet (300 mg total) by mouth daily. 90 tablet 1  . aspirin 81 MG chewable tablet Chew 1 tablet (81 mg total) by mouth daily. 30 tablet 10  . atorvastatin (LIPITOR) 40 MG tablet Take 1 tablet (40 mg total) by mouth every evening. 90  tablet 3  . busPIRone (BUSPAR) 10 MG tablet Take 1 tablet (10 mg total) by mouth 3 (three) times daily. 90 tablet 6  . cetirizine (ZYRTEC) 10 MG tablet Take 10 mg by mouth daily.    . clopidogrel (PLAVIX) 75 MG tablet Take 1 tablet (75 mg total) by mouth daily. 30 tablet 6  . ferrous sulfate 325 (65 FE) MG tablet Take 325 mg by mouth daily with breakfast.    . insulin aspart (NOVOLOG FLEXPEN) 100 UNIT/ML FlexPen INJECT 5 UNITS BEFORE BREAKFAST, LUNCH AND DINNER 15 mL 5  . insulin aspart (NOVOLOG FLEXPEN) 100 UNIT/ML FlexPen Inject 5 Units into the skin 3 (three) times daily with meals. 15 mL 0  . Insulin Glargine (BASAGLAR KWIKPEN) 100 UNIT/ML SOPN INJECT 14 UNITS INTO THE SKIN AT BEDTIME 45 mL 3  . Insulin Pen Needle 32G X 4 MM MISC Use 4x a day 200 each 3  . ipratropium (ATROVENT) 0.03 % nasal spray Place 2 sprays into both nostrils every 12 (twelve) hours. 30 mL 6  . levothyroxine (SYNTHROID, LEVOTHROID) 25 MCG tablet take 1 tablet by mouth every morning before BREAKFAST 30 tablet 6  . linaclotide (LINZESS) 145 MCG CAPS capsule Take 1 capsule (145 mcg total) by mouth daily before breakfast. 30 capsule 2  . metoprolol tartrate (LOPRESSOR) 25 MG tablet Take 0.5 tablets (12.5 mg total) by mouth 2 (two) times daily. 90 tablet 0  . Na Sulfate-K Sulfate-Mg Sulf 17.5-3.13-1.6 GM/177ML SOLN Suprep-Use as directed 354 mL 0  . nitroGLYCERIN (NITROSTAT) 0.4 MG SL tablet Place 1 tablet (0.4 mg total) under the tongue every 5 (five) minutes as needed. (Patient taking differently: Place 0.4 mg under the tongue every 5 (five) minutes as needed for chest pain. ) 30 tablet 2  . sevelamer (RENAGEL) 800 MG tablet Take 2,400 mg by mouth 3 (three) times daily with meals.    . traMADol (ULTRAM) 50 MG tablet Take 1 tablet (50 mg total) by mouth every 12 (twelve) hours as needed. 40 tablet 1  . fluticasone (FLONASE) 50 MCG/ACT nasal spray SHAKE LIQUID AND USE 2 SPRAYS IN EACH NOSTRIL DAILY AS NEEDED FOR ALLERGIES OR  RHINITIS 48 g 0  . gabapentin (NEURONTIN) 400 MG capsule Take 1 capsule (400 mg total) by mouth at bedtime. 30 capsule 6  . hydrOXYzine (ATARAX/VISTARIL) 25 MG tablet Take 0.5 tablets (12.5 mg total) by mouth daily as needed. 30 tablet 0  . isosorbide mononitrate (IMDUR) 60 MG 24 hr tablet take 2  tablets by mouth once daily 180 tablet 1   No facility-administered medications prior to visit.     ROS Review of Systems  Constitutional: Negative for activity change, appetite change and fatigue.  HENT: Negative for congestion, sinus pressure and sore throat.   Eyes: Negative for visual disturbance.  Respiratory: Negative for cough, chest tightness, shortness of breath and wheezing.   Cardiovascular: Negative for chest pain and palpitations.  Gastrointestinal: Positive for constipation. Negative for abdominal distention and abdominal pain.  Endocrine: Negative for polydipsia.  Genitourinary: Negative for dysuria and frequency.  Musculoskeletal: Negative for arthralgias and back pain.  Skin: Negative for rash.  Neurological: Negative for tremors, light-headedness and numbness.  Hematological: Does not bruise/bleed easily.  Psychiatric/Behavioral: Negative for agitation and behavioral problems.    Objective:  BP 107/64   Pulse 77   Temp 98.6 F (37 C) (Oral)   Ht 5\' 10"  (1.778 m)   Wt 220 lb 3.2 oz (99.9 kg)   LMP 10/13/2014 (Exact Date)   SpO2 100%   BMI 31.60 kg/m   BP/Weight 10/21/2017 10/07/2017 5/46/5035  Systolic BP 465 681 275  Diastolic BP 64 48 60  Wt. (Lbs) 220.2 222 219  BMI 31.6 31.85 31.42      Physical Exam  Constitutional: She is oriented to person, place, and time. She appears well-developed and well-nourished.  Cardiovascular: Normal rate, normal heart sounds and intact distal pulses.  No murmur heard. Pulmonary/Chest: Effort normal and breath sounds normal. She has no wheezes. She has no rales. She exhibits no tenderness.  Abdominal: Soft. Bowel sounds are  normal. She exhibits no distension and no mass. There is no tenderness.  Musculoskeletal: Normal range of motion.  Neurological: She is alert and oriented to person, place, and time.  Skin: Skin is warm and dry.  Psychiatric: She has a normal mood and affect.    Lab Results  Component Value Date   HGBA1C 5.5 09/18/2017    Assessment & Plan:   1. Type 2 diabetes mellitus with ESRD (end-stage renal disease) ([place]) Controlled with A1c of 5.5 Continue diabetic regimen Counseled on Diabetic diet, my plate method, 170 minutes of moderate intensity exercise/week Keep blood sugar logs with fasting goals of 80-120 mg/dl, random of less than 180 and in the event of sugars less than 60 mg/dl or greater than 400 mg/dl please notify the clinic ASAP. It is recommended that you undergo annual eye exams and annual foot exams. Pneumonia vaccine is recommended. - POCT glucose (manual entry)  2. Essential hypertension, benign Controlled Counseled on blood pressure goal of less than 130/80, low-sodium, DASH diet, medication compliance, 150 minutes of moderate intensity exercise per week. Discussed medication compliance, adverse effects. - isosorbide mononitrate (IMDUR) 60 MG 24 hr tablet; Take 2 tablets (120 mg total) by mouth daily.  Dispense: 180 tablet; Refill: 1  3. Chronic combined systolic and diastolic congestive heart failure (HCC) EF 35 to 40% Euvolemic - isosorbide mononitrate (IMDUR) 60 MG 24 hr tablet; Take 2 tablets (120 mg total) by mouth daily.  Dispense: 180 tablet; Refill: 1  4. Other diabetic neurological complication associated with type 2 diabetes mellitus (HCC) Controlled - gabapentin (NEURONTIN) 400 MG capsule; Take 1 capsule (400 mg total) by mouth at bedtime.  Dispense: 30 capsule; Refill: 6  5. Other constipation Currently takes MiraLAX and Benefiber  6. ESRD (end stage renal disease) ([place]) Hemodialysis as per schedule  8. PVD, LSFA PTA 12/12 Status post endovascular  intervention in right SFA and popliteal  artery Continue dual antiplatelet therapy Follow-up with vascular  9. Anxiety Doing well on BuSpar   Meds ordered this encounter  Medications  . isosorbide mononitrate (IMDUR) 60 MG 24 hr tablet    Sig: Take 2 tablets (120 mg total) by mouth daily.    Dispense:  180 tablet    Refill:  1  . hydrOXYzine (ATARAX/VISTARIL) 25 MG tablet    Sig: Take 0.5 tablets (12.5 mg total) by mouth daily as needed.    Dispense:  30 tablet    Refill:  0  . fluticasone (FLONASE) 50 MCG/ACT nasal spray    Sig: Place 1 spray into both nostrils daily.    Dispense:  48 g    Refill:  2    **Patient requests 90 days supply**  . gabapentin (NEURONTIN) 400 MG capsule    Sig: Take 1 capsule (400 mg total) by mouth at bedtime.    Dispense:  30 capsule    Refill:  6    Follow-up: Return in about 3 months (around 01/21/2018) for Follow-up of chronic medical conditions.   Charlott Rakes MD

## 2017-10-22 DIAGNOSIS — E1129 Type 2 diabetes mellitus with other diabetic kidney complication: Secondary | ICD-10-CM | POA: Diagnosis not present

## 2017-10-22 DIAGNOSIS — D509 Iron deficiency anemia, unspecified: Secondary | ICD-10-CM | POA: Diagnosis not present

## 2017-10-22 DIAGNOSIS — D631 Anemia in chronic kidney disease: Secondary | ICD-10-CM | POA: Diagnosis not present

## 2017-10-22 DIAGNOSIS — N186 End stage renal disease: Secondary | ICD-10-CM | POA: Diagnosis not present

## 2017-10-22 DIAGNOSIS — N2581 Secondary hyperparathyroidism of renal origin: Secondary | ICD-10-CM | POA: Diagnosis not present

## 2017-10-23 ENCOUNTER — Encounter: Payer: Medicare Other | Admitting: Gastroenterology

## 2017-10-24 ENCOUNTER — Other Ambulatory Visit: Payer: Self-pay | Admitting: Family Medicine

## 2017-10-24 DIAGNOSIS — E1129 Type 2 diabetes mellitus with other diabetic kidney complication: Secondary | ICD-10-CM | POA: Diagnosis not present

## 2017-10-24 DIAGNOSIS — E1149 Type 2 diabetes mellitus with other diabetic neurological complication: Secondary | ICD-10-CM

## 2017-10-24 DIAGNOSIS — D631 Anemia in chronic kidney disease: Secondary | ICD-10-CM | POA: Diagnosis not present

## 2017-10-24 DIAGNOSIS — N2581 Secondary hyperparathyroidism of renal origin: Secondary | ICD-10-CM | POA: Diagnosis not present

## 2017-10-24 DIAGNOSIS — N186 End stage renal disease: Secondary | ICD-10-CM | POA: Diagnosis not present

## 2017-10-24 DIAGNOSIS — D509 Iron deficiency anemia, unspecified: Secondary | ICD-10-CM | POA: Diagnosis not present

## 2017-10-27 DIAGNOSIS — N186 End stage renal disease: Secondary | ICD-10-CM | POA: Diagnosis not present

## 2017-10-27 DIAGNOSIS — D509 Iron deficiency anemia, unspecified: Secondary | ICD-10-CM | POA: Diagnosis not present

## 2017-10-27 DIAGNOSIS — D631 Anemia in chronic kidney disease: Secondary | ICD-10-CM | POA: Diagnosis not present

## 2017-10-27 DIAGNOSIS — E1129 Type 2 diabetes mellitus with other diabetic kidney complication: Secondary | ICD-10-CM | POA: Diagnosis not present

## 2017-10-27 DIAGNOSIS — N2581 Secondary hyperparathyroidism of renal origin: Secondary | ICD-10-CM | POA: Diagnosis not present

## 2017-10-29 DIAGNOSIS — N186 End stage renal disease: Secondary | ICD-10-CM | POA: Diagnosis not present

## 2017-10-29 DIAGNOSIS — N2581 Secondary hyperparathyroidism of renal origin: Secondary | ICD-10-CM | POA: Diagnosis not present

## 2017-10-29 DIAGNOSIS — D509 Iron deficiency anemia, unspecified: Secondary | ICD-10-CM | POA: Diagnosis not present

## 2017-10-29 DIAGNOSIS — D631 Anemia in chronic kidney disease: Secondary | ICD-10-CM | POA: Diagnosis not present

## 2017-10-29 DIAGNOSIS — E1129 Type 2 diabetes mellitus with other diabetic kidney complication: Secondary | ICD-10-CM | POA: Diagnosis not present

## 2017-10-31 DIAGNOSIS — N186 End stage renal disease: Secondary | ICD-10-CM | POA: Diagnosis not present

## 2017-10-31 DIAGNOSIS — D631 Anemia in chronic kidney disease: Secondary | ICD-10-CM | POA: Diagnosis not present

## 2017-10-31 DIAGNOSIS — D509 Iron deficiency anemia, unspecified: Secondary | ICD-10-CM | POA: Diagnosis not present

## 2017-10-31 DIAGNOSIS — E1129 Type 2 diabetes mellitus with other diabetic kidney complication: Secondary | ICD-10-CM | POA: Diagnosis not present

## 2017-10-31 DIAGNOSIS — N2581 Secondary hyperparathyroidism of renal origin: Secondary | ICD-10-CM | POA: Diagnosis not present

## 2017-11-03 ENCOUNTER — Ambulatory Visit (HOSPITAL_COMMUNITY)
Admission: RE | Admit: 2017-11-03 | Discharge: 2017-11-03 | Disposition: A | Discharge status: Home / Self Care | Payer: Medicare Other | Source: Ambulatory Visit | Attending: Gastroenterology | Admitting: Gastroenterology

## 2017-11-03 ENCOUNTER — Ambulatory Visit (HOSPITAL_COMMUNITY): Payer: Medicare Other | Admitting: Anesthesiology

## 2017-11-03 ENCOUNTER — Encounter (HOSPITAL_COMMUNITY)
Admission: RE | Disposition: A | Discharge status: Home / Self Care | Payer: Self-pay | Source: Ambulatory Visit | Attending: Gastroenterology

## 2017-11-03 ENCOUNTER — Other Ambulatory Visit: Payer: Self-pay

## 2017-11-03 ENCOUNTER — Encounter (HOSPITAL_COMMUNITY): Payer: Self-pay

## 2017-11-03 DIAGNOSIS — Z888 Allergy status to other drugs, medicaments and biological substances status: Secondary | ICD-10-CM | POA: Insufficient documentation

## 2017-11-03 DIAGNOSIS — D123 Benign neoplasm of transverse colon: Secondary | ICD-10-CM | POA: Insufficient documentation

## 2017-11-03 DIAGNOSIS — I252 Old myocardial infarction: Secondary | ICD-10-CM | POA: Insufficient documentation

## 2017-11-03 DIAGNOSIS — E1151 Type 2 diabetes mellitus with diabetic peripheral angiopathy without gangrene: Secondary | ICD-10-CM | POA: Insufficient documentation

## 2017-11-03 DIAGNOSIS — Z87891 Personal history of nicotine dependence: Secondary | ICD-10-CM | POA: Insufficient documentation

## 2017-11-03 DIAGNOSIS — N184 Chronic kidney disease, stage 4 (severe): Secondary | ICD-10-CM | POA: Diagnosis not present

## 2017-11-03 DIAGNOSIS — E1122 Type 2 diabetes mellitus with diabetic chronic kidney disease: Secondary | ICD-10-CM | POA: Insufficient documentation

## 2017-11-03 DIAGNOSIS — Z8673 Personal history of transient ischemic attack (TIA), and cerebral infarction without residual deficits: Secondary | ICD-10-CM | POA: Diagnosis not present

## 2017-11-03 DIAGNOSIS — K5730 Diverticulosis of large intestine without perforation or abscess without bleeding: Secondary | ICD-10-CM | POA: Diagnosis not present

## 2017-11-03 DIAGNOSIS — E039 Hypothyroidism, unspecified: Secondary | ICD-10-CM | POA: Insufficient documentation

## 2017-11-03 DIAGNOSIS — M170 Bilateral primary osteoarthritis of knee: Secondary | ICD-10-CM | POA: Diagnosis not present

## 2017-11-03 DIAGNOSIS — Z794 Long term (current) use of insulin: Secondary | ICD-10-CM | POA: Insufficient documentation

## 2017-11-03 DIAGNOSIS — J449 Chronic obstructive pulmonary disease, unspecified: Secondary | ICD-10-CM | POA: Diagnosis not present

## 2017-11-03 DIAGNOSIS — Z880 Allergy status to penicillin: Secondary | ICD-10-CM | POA: Insufficient documentation

## 2017-11-03 DIAGNOSIS — F419 Anxiety disorder, unspecified: Secondary | ICD-10-CM | POA: Insufficient documentation

## 2017-11-03 DIAGNOSIS — M19049 Primary osteoarthritis, unspecified hand: Secondary | ICD-10-CM | POA: Insufficient documentation

## 2017-11-03 DIAGNOSIS — E785 Hyperlipidemia, unspecified: Secondary | ICD-10-CM | POA: Diagnosis not present

## 2017-11-03 DIAGNOSIS — D124 Benign neoplasm of descending colon: Secondary | ICD-10-CM | POA: Diagnosis not present

## 2017-11-03 DIAGNOSIS — I130 Hypertensive heart and chronic kidney disease with heart failure and stage 1 through stage 4 chronic kidney disease, or unspecified chronic kidney disease: Secondary | ICD-10-CM | POA: Insufficient documentation

## 2017-11-03 DIAGNOSIS — E1140 Type 2 diabetes mellitus with diabetic neuropathy, unspecified: Secondary | ICD-10-CM | POA: Diagnosis not present

## 2017-11-03 DIAGNOSIS — K219 Gastro-esophageal reflux disease without esophagitis: Secondary | ICD-10-CM | POA: Insufficient documentation

## 2017-11-03 DIAGNOSIS — E669 Obesity, unspecified: Secondary | ICD-10-CM | POA: Insufficient documentation

## 2017-11-03 DIAGNOSIS — Z79899 Other long term (current) drug therapy: Secondary | ICD-10-CM | POA: Insufficient documentation

## 2017-11-03 DIAGNOSIS — Z7982 Long term (current) use of aspirin: Secondary | ICD-10-CM | POA: Insufficient documentation

## 2017-11-03 DIAGNOSIS — I2510 Atherosclerotic heart disease of native coronary artery without angina pectoris: Secondary | ICD-10-CM | POA: Diagnosis not present

## 2017-11-03 DIAGNOSIS — D649 Anemia, unspecified: Secondary | ICD-10-CM

## 2017-11-03 DIAGNOSIS — Z951 Presence of aortocoronary bypass graft: Secondary | ICD-10-CM | POA: Insufficient documentation

## 2017-11-03 DIAGNOSIS — K5900 Constipation, unspecified: Secondary | ICD-10-CM | POA: Insufficient documentation

## 2017-11-03 DIAGNOSIS — I509 Heart failure, unspecified: Secondary | ICD-10-CM | POA: Insufficient documentation

## 2017-11-03 DIAGNOSIS — D122 Benign neoplasm of ascending colon: Secondary | ICD-10-CM | POA: Insufficient documentation

## 2017-11-03 DIAGNOSIS — Z955 Presence of coronary angioplasty implant and graft: Secondary | ICD-10-CM | POA: Insufficient documentation

## 2017-11-03 DIAGNOSIS — R195 Other fecal abnormalities: Secondary | ICD-10-CM

## 2017-11-03 DIAGNOSIS — Z6831 Body mass index (BMI) 31.0-31.9, adult: Secondary | ICD-10-CM | POA: Insufficient documentation

## 2017-11-03 DIAGNOSIS — Z992 Dependence on renal dialysis: Secondary | ICD-10-CM | POA: Insufficient documentation

## 2017-11-03 HISTORY — PX: COLONOSCOPY WITH PROPOFOL: SHX5780

## 2017-11-03 HISTORY — PX: POLYPECTOMY: SHX5525

## 2017-11-03 LAB — POCT I-STAT 4, (NA,K, GLUC, HGB,HCT)
GLUCOSE: 98 mg/dL (ref 70–99)
HEMATOCRIT: 31 % — AB (ref 36.0–46.0)
Hemoglobin: 10.5 g/dL — ABNORMAL LOW (ref 12.0–15.0)
Potassium: 4.1 mmol/L (ref 3.5–5.1)
Sodium: 138 mmol/L (ref 135–145)

## 2017-11-03 LAB — GLUCOSE, CAPILLARY: Glucose-Capillary: 95 mg/dL (ref 70–99)

## 2017-11-03 SURGERY — COLONOSCOPY WITH PROPOFOL
Anesthesia: Monitor Anesthesia Care

## 2017-11-03 MED ORDER — PROPOFOL 10 MG/ML IV BOLUS
INTRAVENOUS | Status: DC | PRN
  Administered 2017-11-03: 30 mg via INTRAVENOUS

## 2017-11-03 MED ORDER — SODIUM CHLORIDE 0.9 % IV SOLN
INTRAVENOUS | Status: DC
  Administered 2017-11-03: 500 mL via INTRAVENOUS

## 2017-11-03 MED ORDER — LACTATED RINGERS IV SOLN: INTRAVENOUS | Status: DC

## 2017-11-03 MED ORDER — EPHEDRINE SULFATE-NACL 50-0.9 MG/10ML-% IV SOSY
PREFILLED_SYRINGE | INTRAVENOUS | Status: DC | PRN
  Administered 2017-11-03 (×2): 10 mg via INTRAVENOUS

## 2017-11-03 MED ORDER — PROPOFOL 500 MG/50ML IV EMUL
INTRAVENOUS | Status: DC | PRN
  Administered 2017-11-03: 100 ug/kg/min via INTRAVENOUS

## 2017-11-03 MED ORDER — PROPOFOL 10 MG/ML IV BOLUS
INTRAVENOUS | Status: AC
  Filled 2017-11-03: qty 40

## 2017-11-03 MED ORDER — LIDOCAINE 2% (20 MG/ML) 5 ML SYRINGE
INTRAMUSCULAR | Status: DC | PRN
  Administered 2017-11-03: 60 mg via INTRAVENOUS

## 2017-11-03 SURGICAL SUPPLY — 21 items

## 2017-11-03 NOTE — Discharge Instructions (Signed)
YOU HAD AN ENDOSCOPIC PROCEDURE TODAY: Refer to the procedure report and other information in the discharge instructions given to you for any specific questions about what was found during the examination. If this information does not answer your questions, please call [place] office at [phone] to clarify.  ° °YOU SHOULD EXPECT: Some feelings of bloating in the abdomen. Passage of more gas than usual. Walking can help get rid of the air that was put into your GI tract during the procedure and reduce the bloating. If you had a lower endoscopy (such as a colonoscopy or flexible sigmoidoscopy) you may notice spotting of blood in your stool or on the toilet paper. Some abdominal soreness may be present for a day or two, also. ° °DIET: Your first meal following the procedure should be a light meal and then it is ok to progress to your normal diet. A half-sandwich or bowl of soup is an example of a good first meal. Heavy or fried foods are harder to digest and may make you feel nauseous or bloated. Drink plenty of fluids but you should avoid alcoholic beverages for 24 hours. If you had a esophageal dilation, please see attached instructions for diet.   ° °ACTIVITY: Your care partner should take you home directly after the procedure. You should plan to take it easy, moving slowly for the rest of the day. You can resume normal activity the day after the procedure however YOU SHOULD NOT DRIVE, use power tools, machinery or perform tasks that involve climbing or major physical exertion for 24 hours (because of the sedation medicines used during the test).  ° °SYMPTOMS TO REPORT IMMEDIATELY: °A gastroenterologist can be reached at any hour. Please call [phone]  for any of the following symptoms:  °Following lower endoscopy (colonoscopy, flexible sigmoidoscopy) °Excessive amounts of blood in the stool  °Significant tenderness, worsening of abdominal pains  °Swelling of the abdomen that is new, acute  °Fever of 100° or  higher  °Following upper endoscopy (EGD, EUS, ERCP, esophageal dilation) °Vomiting of blood or coffee ground material  °New, significant abdominal pain  °New, significant chest pain or pain under the shoulder blades  °Painful or persistently difficult swallowing  °New shortness of breath  °Black, tarry-looking or red, bloody stools ° °FOLLOW UP:  °If any biopsies were taken you will be contacted by phone or by letter within the next 1-3 weeks. Call [phone]  if you have not heard about the biopsies in 3 weeks.  °Please also call with any specific questions about appointments or follow up tests. ° °

## 2017-11-03 NOTE — H&P (Signed)
History:  This patient presents for endoscopic testing for heme positive stool.  Brooke Stafford Referring physician: Charlott Rakes, MD  Past Medical History: Past Medical History:  Diagnosis Date  . Anemia   . Anginal pain ([place])   . Anxiety   . Arthritis    "stiff fingers and knees" (08/04/2013), (12/12/2014)  . Asthma   . CAD (coronary artery disease) 2002; 2015   CABG x 6 2002, cath 2011- med Rx stent DES VG-Diag  . CAD (coronary artery disease) of artery bypass graft; DES to VG-Diag 09/28/13 11/09/2013  . Cataract   . CHF (congestive heart failure) ([place])    "in 2002" (11/26/2012)  . Chronic bronchitis ([place])    "q year; in the winter"   . CKD (chronic kidney disease)    stage 4, followed by [facility]  . Coronary artery disease 2002   CABG x 6. Cath 5/11- med Rx  . Diabetic neuropathy ([place])   . GERD (gastroesophageal reflux disease)   . Gout    "right big toe"  . Headache    "~ q week" (08/04/2013); "~ twice/month" (12/12/2014)  . History of blood transfusion 2002   "when I had OHS"  . Hyperlipidemia   . Hypertension   . Hypothyroid    treated  . Migraines    "couple times/year" (08/04/2013), (12/12/2014)  . Myocardial infarction ([place]) 2000; 2002; 2011  . Obesity (BMI 35.0-39.9 without comorbidity)   . Peripheral vascular disease ([place]) 12/12   LSFA PTA  . Pneumonia    "3 times I think" (12/12/2014)  . Stroke [facility])    " mini stroke"  . Type II diabetes mellitus ([place])      Past Surgical History: Past Surgical History:  Procedure Laterality Date  . ABDOMINAL AORTAGRAM N/A 04/05/2011   Procedure: ABDOMINAL AORTAGRAM;  Surgeon: Lorretta Harp, MD;  Location: [facility] CATH LAB;  Service: Cardiovascular;  Laterality: N/A;  . ABDOMINAL AORTOGRAM N/A 10/30/2016   Procedure: Abdominal Aortogram;  Surgeon: Wellington Hampshire, MD;  Location: [place] CV LAB;  Service: Cardiovascular;  Laterality: N/A;  . APPENDECTOMY  1980  . AV FISTULA PLACEMENT Left 05/10/2016   Procedure: LEFT ARM ARTERIOVENOUS (AV) FISTULA CREATION;  Surgeon: Rosetta Posner, MD;  Location: [place];  Service: Vascular;  Laterality: Left;  . BASCILIC VEIN TRANSPOSITION Left 06/26/2016   Procedure: SECOND STAGE BASILIC VEIN TRANSPOSITION;  Surgeon: Rosetta Posner, MD;  Location: [place];  Service: Vascular;  Laterality: Left;  . BREAST CYST EXCISION Right 1970's  . CARDIAC CATHETERIZATION  2002  . CARDIAC CATHETERIZATION N/A 12/12/2014   Procedure: Left Heart Cath and Cors/Grafts Angiography;  Surgeon: Peter M Martinique, MD;  Location: [place] CV LAB;  Service: Cardiovascular;  Laterality: N/A;  . CARDIAC CATHETERIZATION N/A 12/12/2014   Procedure: Coronary Stent Intervention;  Surgeon: Peter M Martinique, MD;  Location: [place] CV LAB;  Service: Cardiovascular;  Laterality: N/A;  . CATARACT EXTRACTION, BILATERAL    . CESAREAN SECTION  1978; 1980  . CHOLECYSTECTOMY  1982  . CORONARY ANGIOPLASTY WITH STENT PLACEMENT  2004; 2012   "I have 2 stents" (08/04/2013)  . CORONARY ANGIOPLASTY WITH STENT PLACEMENT  09/28/13   PTCA/ DES Xience stent to VG-Diag   . CORONARY ARTERY BYPASS GRAFT  11/20/2000   x6 LIMA to distal LAD, svg to first diag, svg to ramus intermediate branch and swquential SVG to cir marginal branch, SVG to posterior descending coronary and sequential SVG to first right posterolateral branch  .  eye injections    . INSERTION OF DIALYSIS CATHETER Right 05/10/2016   Procedure: INSERTION OF DIALYSIS CATHETER - Right Internal Jugular Placement;  Surgeon: Rosetta Posner, MD;  Location: [place];  Service: Vascular;  Laterality: Right;  . LEFT HEART CATHETERIZATION WITH CORONARY/GRAFT ANGIOGRAM N/A 09/28/2013   Procedure: LEFT HEART CATHETERIZATION WITH Beatrix Fetters;  Surgeon: Peter M Martinique, MD;  Location: [facility] CATH LAB;  Service: Cardiovascular;  Laterality: N/A;  . LOWER EXTREMITY ANGIOGRAM  12/01/2012   Procedure: LOWER EXTREMITY ANGIOGRAM;  Surgeon: Lorretta Harp, MD;  Location: [facility]  CATH LAB;  Service: Cardiovascular;;  . LOWER EXTREMITY ANGIOGRAPHY Right 10/30/2016   Procedure: Lower Extremity Angiography;  Surgeon: Wellington Hampshire, MD;  Location: [place] CV LAB;  Service: Cardiovascular;  Laterality: Right;  . NM MYOCAR PERF WALL MOTION  08/27/2004   negative  . PERCUTANEOUS STENT INTERVENTION Left 12/01/2012   Procedure: PERCUTANEOUS STENT INTERVENTION;  Surgeon: Lorretta Harp, MD;  Location: [facility] CATH LAB;  Service: Cardiovascular;  Laterality: Left;  Left SFA  . PERIPHERAL ARTERIAL STENT GRAFT Left    SFA/notes 04/07/2011 (11/30/2012)  . PERIPHERAL VASCULAR ATHERECTOMY Right 10/30/2016   Procedure: Peripheral Vascular Atherectomy;  Surgeon: Wellington Hampshire, MD;  Location: [place] CV LAB;  Service: Cardiovascular;  Laterality: Right;  cancel unable to  . PERIPHERAL VASCULAR INTERVENTION Right 10/30/2016   Procedure: Peripheral Vascular Intervention;  Surgeon: Wellington Hampshire, MD;  Location: [place] CV LAB;  Service: Cardiovascular;  Laterality: Right;  SFA  . RENAL ANGIOGRAM N/A 04/05/2011   Procedure: RENAL ANGIOGRAM;  Surgeon: Lorretta Harp, MD;  Location: [facility] CATH LAB;  Service: Cardiovascular;  Laterality: N/A;  . TUBAL LIGATION  1980    Allergies: Allergies  Allergen Reactions  . Digoxin And Related Diarrhea and Other (See Comments)    TOXIC DRUG LEVELS Patient stated she almost died. Had flu like symptoms as well as diarrhea.  . Hydralazine Shortness Of Breath  . Penicillins Cross Reactors Hives and Other (See Comments)    HIGH FEVER  Has patient had a PCN reaction causing immediate rash, facial/tongue/throat swelling, SOB or lightheadedness with hypotension: No Has patient had a PCN reaction causing SEVERE RASH INVOLVING MUCUS MEMBRANES or SKIN NECROSIS  #  #  #  YES  #  #  #  Has patient had a PCN reaction that required hospitalization: Unk Has patient had a PCN reaction occurring within the last 10 years: Unk If all of the above  answers are "NO", then may proceed with Cephalosporin use.   [name] Lisinopril Other (See Comments)    Felt like she had the flu. Was very sick!!!  . Adhesive [Tape] Rash and Other (See Comments)    bruising    Outpatient Meds: Current Facility-Administered Medications  Medication Dose Route Frequency Provider Last Rate Last Dose  . 0.9 %  sodium chloride infusion   Intravenous Continuous Nelida Meuse III, MD 20 mL/hr at 11/03/17 0949 500 mL at 11/03/17 0949  . lactated ringers infusion   Intravenous Continuous Nelida Meuse III, MD          ___________________________________________________________________ Objective   Exam:  BP 128/67   Pulse 61   Temp 98.7 F (37.1 C) (Oral)   Resp 14   Ht 5\' 10"  (1.778 m)   Wt 220 lb (99.8 kg)   LMP 10/13/2014 (Exact Date)   SpO2 97%   BMI 31.57 kg/m    CV: RRR without murmur, S1/S2,  no JVD, no peripheral edema  Resp: clear to auscultation bilaterally, normal RR and effort noted  GI: soft, no tenderness, with active bowel sounds. No guarding or palpable organomegaly noted.  Neuro: awake, alert and oriented x 3. Normal gross motor function and fluent speech  Last HD 3 days ago. Istat potassium 4.1 I spoke to Gershon Mussel, the nurse at [facility] center.  He gave plan that if potassium normal today, to have patient report at 12:20 tomorrow for HD.  Ms. Fairbank was given that information and expressed understanding.  Assessment:  Heme positive stool  Plan:  Colonoscopy   Nelida Meuse III

## 2017-11-03 NOTE — Anesthesia Preprocedure Evaluation (Addendum)
Anesthesia Evaluation  Patient identified by MRN, date of birth, ID band Patient awake    Reviewed: Allergy & Precautions, NPO status , Patient's Chart, lab work & pertinent test results  Airway Mallampati: III  TM Distance: <3 FB Neck ROM: Full    Dental no notable dental hx.    Pulmonary COPD, former smoker,    Pulmonary exam normal breath sounds clear to auscultation       Cardiovascular hypertension, + CAD, + Past MI, + Cardiac Stents and + CABG  Normal cardiovascular exam Rhythm:Regular Rate:Normal     Neuro/Psych TIAnegative psych ROS   GI/Hepatic negative GI ROS, Neg liver ROS,   Endo/Other  diabetes, Insulin DependentHypothyroidism   Renal/GU DialysisRenal disease  negative genitourinary   Musculoskeletal negative musculoskeletal ROS (+)   Abdominal   Peds negative pediatric ROS (+)  Hematology negative hematology ROS (+)   Anesthesia Other Findings   Reproductive/Obstetrics negative OB ROS                            Anesthesia Physical Anesthesia Plan  ASA: III  Anesthesia Plan: MAC   Post-op Pain Management:    Induction: Intravenous  PONV Risk Score and Plan: 0  Airway Management Planned: Simple Face Mask  Additional Equipment:   Intra-op Plan:   Post-operative Plan:   Informed Consent: I have reviewed the patients History and Physical, chart, labs and discussed the procedure including the risks, benefits and alternatives for the proposed anesthesia with the patient or authorized representative who has indicated his/her understanding and acceptance.   Dental advisory given  Plan Discussed with: CRNA and Surgeon  Anesthesia Plan Comments:         Anesthesia Quick Evaluation

## 2017-11-03 NOTE — Anesthesia Postprocedure Evaluation (Signed)
Anesthesia Post Note  Patient: Brooke Stafford  Procedure(s) Performed: COLONOSCOPY WITH PROPOFOL (N/A ) POLYPECTOMY     Patient location during evaluation: PACU Anesthesia Type: MAC Level of consciousness: awake and alert Pain management: pain level controlled Vital Signs Assessment: post-procedure vital signs reviewed and stable Respiratory status: spontaneous breathing, nonlabored ventilation, respiratory function stable and patient connected to nasal cannula oxygen Cardiovascular status: stable and blood pressure returned to baseline Postop Assessment: no apparent nausea or vomiting Anesthetic complications: no    Last Vitals:  Vitals:   11/03/17 1050 11/03/17 1100  BP: (!) 112/48 (!) 113/40  Pulse: 64 63  Resp: 17 18  Temp:    SpO2: 100% 99%    Last Pain:  Vitals:   11/03/17 1100  TempSrc:   PainSc: 0-No pain                 [name] S

## 2017-11-03 NOTE — Op Note (Signed)
[facility] Patient Name: Brooke Stafford Procedure Date: 11/03/2017 MRN: [id number] Attending MD: Estill Cotta. Loletha Carrow , MD Date of Birth: July 10, 1955 CSN: [id number] Age: 63 Admit Type: Outpatient Procedure:                Colonoscopy Indications:              Heme positive stool, Constipation Providers:                Mallie Mussel L. Loletha Carrow, MD, Angus Seller, Janie Billups,                            Technician, Marla Roe, CRNA Referring MD:             Charlott Rakes, MD Medicines:                Monitored Anesthesia Care Complications:            No immediate complications. Estimated Blood Loss:     Estimated blood loss was minimal. Procedure:                Pre-Anesthesia Assessment:                           - Prior to the procedure, a History and Physical                            was performed, and patient medications and                            allergies were reviewed. The patient's tolerance of                            previous anesthesia was also reviewed. The risks                            and benefits of the procedure and the sedation                            options and risks were discussed with the patient.                            All questions were answered, and informed consent                            was obtained. Prior Anticoagulants: The patient has                            taken Plavix (clopidogrel), last dose was 5 days                            prior to procedure. ASA Grade Assessment: III - A                            patient with severe systemic disease. After  reviewing the risks and benefits, the patient was                            deemed in satisfactory condition to undergo the                            procedure.                           After obtaining informed consent, the colonoscope                            was passed under direct vision. Throughout the                            procedure,  the patient's blood pressure, pulse, and                            oxygen saturations were monitored continuously. The                            EC-[id number] ([id number]) scope was introduced through                            the anus and advanced to the the cecum, identified                            by appendiceal orifice and ileocecal valve. The                            colonoscopy was performed without difficulty. The                            patient tolerated the procedure well. The quality                            of the bowel preparation was good. The ileocecal                            valve, appendiceal orifice, and rectum were                            photographed. The quality of the bowel preparation                            was evaluated using the BBPS [facility] Bowel                            Preparation Scale) with scores of: Right Colon = 2,                            Transverse Colon = 2 and Left Colon = 2. The total  BBPS score equals 6. The bowel preparation used was                            SUPREP. Scope In: 10:16:23 AM Scope Out: 10:41:07 AM Scope Withdrawal Time: 0 hours 15 minutes 47 seconds  Total Procedure Duration: 0 hours 24 minutes 44 seconds  Findings:      The perianal and digital rectal examinations were normal.      A 12 mm polyp was found in the proximal ascending colon. The polyp was       sessile. The polyp was removed with a hot snare. Resection and retrieval       were complete.      A 5 mm polyp was found in the transverse colon. The polyp was sessile.       The polyp was removed with a cold snare. Resection and retrieval were       complete.      A 8 mm polyp was found in the descending colon. The polyp was       semi-pedunculated. The polyp was removed with a hot snare. Resection and       retrieval were complete.      A few small-mouthed diverticula were found in the left colon.      The exam was otherwise  without abnormality on direct and retroflexion       views. Impression:               - One 12 mm polyp in the proximal ascending colon,                            removed with a hot snare. Resected and retrieved.                           - One 5 mm polyp in the transverse colon, removed                            with a cold snare. Resected and retrieved.                           - One 8 mm polyp in the descending colon, removed                            with a hot snare. Resected and retrieved.                           - Diverticulosis in the left colon.                           - The examination was otherwise normal on direct                            and retroflexion views. Moderate Sedation:      N/A- Per Anesthesia Care Recommendation:           - Patient has a contact number available for  emergencies. The signs and symptoms of potential                            delayed complications were discussed with the                            patient. Return to normal activities tomorrow.                            Written discharge instructions were provided to the                            patient.                           - Resume previous diet.                           - Resume Plavix (clopidogrel) at prior dose                            tomorrow.                           - Continue present medications except iron.                            Discontinue oral iron. IV iron can be given during                            hemodialysis if needed.                           - Take miralax daily to relieve constipation. If a                            whole capful causes loose stool, take a half or                            quarter capful.                           - Await pathology results.                           - Repeat colonoscopy is recommended for                            surveillance. The colonoscopy date will be                             determined after pathology results from today's                            exam become available for review. Procedure Code(s):        --- Professional ---  45385, Colonoscopy, flexible; with removal of                            tumor(s), polyp(s), or other lesion(s) by snare                            technique Diagnosis Code(s):        --- Professional ---                           D12.2, Benign neoplasm of ascending colon                           D12.3, Benign neoplasm of transverse colon (hepatic                            flexure or splenic flexure)                           D12.4, Benign neoplasm of descending colon                           R19.5, Other fecal abnormalities                           K59.00, Constipation, unspecified                           K57.30, Diverticulosis of large intestine without                            perforation or abscess without bleeding CPT copyright 2017 American Medical Association. All rights reserved. The codes documented in this report are preliminary and upon coder review may  be revised to meet current compliance requirements. Henry L. Loletha Carrow, MD 11/03/2017 10:53:57 AM This report has been signed electronically. Number of Addenda: 0

## 2017-11-03 NOTE — Anesthesia Procedure Notes (Signed)
Date/Time: 11/03/2017 10:13 AM Performed by: Talbot Grumbling, CRNA Oxygen Delivery Method: Simple face mask

## 2017-11-03 NOTE — Interval H&P Note (Signed)
History and Physical Interval Note:  11/03/2017 10:08 AM  Brooke Stafford  has presented today for surgery, with the diagnosis of constipation, anemia, pos IFOB  The various methods of treatment have been discussed with the patient and family. After consideration of risks, benefits and other options for treatment, the patient has consented to  Procedure(s): COLONOSCOPY WITH PROPOFOL (N/A) as a surgical intervention .  The patient's history has been reviewed, patient examined, no change in status, stable for surgery.  I have reviewed the patient's chart and labs.  Questions were answered to the patient's satisfaction.     Nelida Meuse III

## 2017-11-03 NOTE — Transfer of Care (Signed)
Immediate Anesthesia Transfer of Care Note  Patient: Brooke Stafford  Procedure(s) Performed: COLONOSCOPY WITH PROPOFOL (N/A ) POLYPECTOMY  Patient Location: PACU  Anesthesia Type:MAC  Level of Consciousness: sedated  Airway & Oxygen Therapy: Patient Spontanous Breathing and Patient connected to face mask oxygen  Post-op Assessment: Report given to RN and Post -op Vital signs reviewed and stable  Post vital signs: Reviewed and stable  Last Vitals:  Vitals Value Taken Time  BP    Temp    Pulse    Resp    SpO2      Last Pain:  Vitals:   11/03/17 0943  TempSrc: Oral  PainSc: 0-No pain         Complications: No apparent anesthesia complications

## 2017-11-04 ENCOUNTER — Encounter: Payer: Self-pay | Admitting: Gastroenterology

## 2017-11-04 DIAGNOSIS — D509 Iron deficiency anemia, unspecified: Secondary | ICD-10-CM | POA: Diagnosis not present

## 2017-11-04 DIAGNOSIS — N186 End stage renal disease: Secondary | ICD-10-CM | POA: Diagnosis not present

## 2017-11-04 DIAGNOSIS — N2581 Secondary hyperparathyroidism of renal origin: Secondary | ICD-10-CM | POA: Diagnosis not present

## 2017-11-04 DIAGNOSIS — D631 Anemia in chronic kidney disease: Secondary | ICD-10-CM | POA: Diagnosis not present

## 2017-11-04 DIAGNOSIS — E1129 Type 2 diabetes mellitus with other diabetic kidney complication: Secondary | ICD-10-CM | POA: Diagnosis not present

## 2017-11-05 DIAGNOSIS — E1129 Type 2 diabetes mellitus with other diabetic kidney complication: Secondary | ICD-10-CM | POA: Diagnosis not present

## 2017-11-05 DIAGNOSIS — N186 End stage renal disease: Secondary | ICD-10-CM | POA: Diagnosis not present

## 2017-11-05 DIAGNOSIS — D631 Anemia in chronic kidney disease: Secondary | ICD-10-CM | POA: Diagnosis not present

## 2017-11-05 DIAGNOSIS — N2581 Secondary hyperparathyroidism of renal origin: Secondary | ICD-10-CM | POA: Diagnosis not present

## 2017-11-05 DIAGNOSIS — D509 Iron deficiency anemia, unspecified: Secondary | ICD-10-CM | POA: Diagnosis not present

## 2017-11-06 ENCOUNTER — Encounter (HOSPITAL_COMMUNITY): Payer: Self-pay | Admitting: Gastroenterology

## 2017-11-07 DIAGNOSIS — N2581 Secondary hyperparathyroidism of renal origin: Secondary | ICD-10-CM | POA: Diagnosis not present

## 2017-11-07 DIAGNOSIS — D631 Anemia in chronic kidney disease: Secondary | ICD-10-CM | POA: Diagnosis not present

## 2017-11-07 DIAGNOSIS — D509 Iron deficiency anemia, unspecified: Secondary | ICD-10-CM | POA: Diagnosis not present

## 2017-11-07 DIAGNOSIS — E1129 Type 2 diabetes mellitus with other diabetic kidney complication: Secondary | ICD-10-CM | POA: Diagnosis not present

## 2017-11-07 DIAGNOSIS — N186 End stage renal disease: Secondary | ICD-10-CM | POA: Diagnosis not present

## 2017-11-10 DIAGNOSIS — N2581 Secondary hyperparathyroidism of renal origin: Secondary | ICD-10-CM | POA: Diagnosis not present

## 2017-11-10 DIAGNOSIS — E1129 Type 2 diabetes mellitus with other diabetic kidney complication: Secondary | ICD-10-CM | POA: Diagnosis not present

## 2017-11-10 DIAGNOSIS — N186 End stage renal disease: Secondary | ICD-10-CM | POA: Diagnosis not present

## 2017-11-10 DIAGNOSIS — D509 Iron deficiency anemia, unspecified: Secondary | ICD-10-CM | POA: Diagnosis not present

## 2017-11-10 DIAGNOSIS — D631 Anemia in chronic kidney disease: Secondary | ICD-10-CM | POA: Diagnosis not present

## 2017-11-11 ENCOUNTER — Encounter: Payer: Self-pay | Admitting: Sports Medicine

## 2017-11-11 ENCOUNTER — Ambulatory Visit (INDEPENDENT_AMBULATORY_CARE_PROVIDER_SITE_OTHER): Payer: Medicare Other | Admitting: Sports Medicine

## 2017-11-11 DIAGNOSIS — E1142 Type 2 diabetes mellitus with diabetic polyneuropathy: Secondary | ICD-10-CM

## 2017-11-11 DIAGNOSIS — M79675 Pain in left toe(s): Secondary | ICD-10-CM

## 2017-11-11 DIAGNOSIS — M79674 Pain in right toe(s): Secondary | ICD-10-CM

## 2017-11-11 DIAGNOSIS — B351 Tinea unguium: Secondary | ICD-10-CM | POA: Diagnosis not present

## 2017-11-11 DIAGNOSIS — I739 Peripheral vascular disease, unspecified: Secondary | ICD-10-CM | POA: Diagnosis not present

## 2017-11-11 NOTE — Progress Notes (Signed)
Subjective: Brooke Stafford is a 62 y.o. female patient seen in office for follow up nail care. Reports that she had pain to nails especially at right 1st toe and 3rd toe, Patient has no other pedal complaints at this time.  FBS this AM not recorded.  Patient is also a dialysis patient and is on Plavix, follows with vascular.   Patient Active Problem List   Diagnosis Date Noted  . Heme positive stool   . Benign neoplasm of ascending colon   . Benign neoplasm of transverse colon   . Benign neoplasm of descending colon   . Hypercholesterolemia 06/18/2017  . 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 Diabetic neuropathy ([place]) 04/17/2017  . GERD (gastroesophageal reflux disease) 04/17/2017  . Foot ulcer, right ([place]) 11/28/2016  . Cervical polyp 11/12/2016  . AV (arteriovenous fistula) ([place]) 06/17/2016  . Mixed hyperlipidemia 05/21/2016  . ESRD (end stage renal disease) ([place])   . Hypokalemia 05/03/2016  . COPD exacerbation ([place]) 05/03/2016  . COPD (chronic obstructive pulmonary disease) ([place]) 04/11/2016  . CAP (community acquired pneumonia) 03/26/2016  . Acute respiratory failure with hypoxia ([place]) 03/26/2016  . Acute on chronic combined systolic and diastolic CHF (congestive heart failure) ([place]) 03/26/2016  . Medication management 03/21/2016  . Anemia of chronic disease 12/27/2015  . Acute on chronic systolic CHF (congestive heart failure) ([place])   . Acute CHF ([place]) 12/18/2015  . Hypertensive heart and renal disease with CHF and ESRD ([place]) 12/18/2015  . Constipation 12/18/2015  . Anxiety 11/02/2015  . Posterior circulation stroke ([place]) 11/02/2015  . Abnormality of gait 10/23/2015  . Depression 09/28/2015  . Diabetic retinopathy ([place]) 09/13/2015  . Grief reaction 08/24/2015  . Creatinine elevation 07/25/2015  . Chronic combined systolic and diastolic congestive heart failure ([place]) 07/25/2015  . Rash of hands  02/23/2015  . Essential hypertension   . Angina pectoris ([place]) 12/12/2014  . Abnormal nuclear stress test   . Acute combined systolic and diastolic heart failure ([place]) 12/02/2014  . Seasonal allergies 08/11/2014  . Gout 08/11/2014  . Encounter for screening mammogram for breast cancer 05/16/2014  . Screening for colon cancer 05/16/2014  . Type 2 diabetes mellitus with ESRD (end-stage renal disease) ([place]) 01/27/2014  . Atopic eczema 01/27/2014  . Precordial pain, atypical, negative MI, Musculature Skeletal pain  11/08/2013  . CKD (chronic kidney disease) stage 5, GFR less than 15 ml/min ([facility]) 11/08/2013  . Unstable angina ([place]) 09/28/2013  . Ischemic cardiomyopathy- new drop in EF 08/30/2013  . Chest pain 08/04/2013  . CAD -S/P PCI June 2015 and 12/14/14 02/01/2013  . Hypothyroidism, acquired 02/01/2013  . PVD, LSFA PTA 12/12 04/06/2011  . Hx of CABG x 6 2002 04/06/2011  . Dyslipidemia 04/06/2011   Current Outpatient Medications on File Prior to Visit  Medication Sig Dispense Refill  . allopurinol (ZYLOPRIM) 300 MG tablet Take 1 tablet (300 mg total) by mouth daily. 90 tablet 1  . aspirin 81 MG chewable tablet Chew 1 tablet (81 mg total) by mouth daily. 30 tablet 10  . atorvastatin (LIPITOR) 40 MG tablet Take 1 tablet (40 mg total) by mouth every evening. 90 tablet 3  . busPIRone (BUSPAR) 10 MG tablet Take 1 tablet (10 mg total) by mouth 3 (three) times daily. 90 tablet 6  . cetirizine (ZYRTEC) 10 MG tablet Take 10 mg by mouth daily.    . clopidogrel (PLAVIX) 75 MG tablet Take 1 tablet (75 mg total)  by mouth daily. 30 tablet 6  . fluticasone (FLONASE) 50 MCG/ACT nasal spray Place 1 spray into both nostrils daily. (Patient taking differently: Place 2 sprays into both nostrils daily. ) 48 g 2  . gabapentin (NEURONTIN) 400 MG capsule Take 1 capsule (400 mg total) by mouth at bedtime. 30 capsule 6  . hydrOXYzine (ATARAX/VISTARIL) 25 MG tablet Take 0.5 tablets (12.5 mg total) by mouth daily  as needed. (Patient taking differently: Take 12.5 mg by mouth daily as needed for anxiety. ) 30 tablet 0  . insulin aspart (NOVOLOG FLEXPEN) 100 UNIT/ML FlexPen Inject 5 Units into the skin 3 (three) times daily with meals. 15 mL 0  . Insulin Glargine (BASAGLAR KWIKPEN) 100 UNIT/ML SOPN INJECT 14 UNITS INTO THE SKIN AT BEDTIME 45 mL 3  . Insulin Pen Needle 32G X 4 MM MISC Use 4x a day (Patient not taking: Reported on 10/28/2017) 200 each 3  . ipratropium (ATROVENT) 0.03 % nasal spray Place 2 sprays into both nostrils every 12 (twelve) hours. (Patient taking differently: Place 2 sprays into both nostrils 2 (two) times daily as needed for rhinitis. ) 30 mL 6  . isosorbide mononitrate (IMDUR) 60 MG 24 hr tablet Take 2 tablets (120 mg total) by mouth daily. 180 tablet 1  . levothyroxine (SYNTHROID, LEVOTHROID) 25 MCG tablet take 1 tablet by mouth every morning before BREAKFAST (Patient taking differently: Take 25 mcg by mouth daily before breakfast. ) 30 tablet 6  . lidocaine-prilocaine (EMLA) cream Apply 1 application topically as needed (for port access).   4  . linaclotide (LINZESS) 145 MCG CAPS capsule Take 1 capsule (145 mcg total) by mouth daily before breakfast. 30 capsule 2  . metoprolol tartrate (LOPRESSOR) 25 MG tablet Take 0.5 tablets (12.5 mg total) by mouth 2 (two) times daily. (Patient taking differently: Take 25 mg by mouth at bedtime. ) 90 tablet 0  . Na Sulfate-K Sulfate-Mg Sulf 17.5-3.13-1.6 GM/177ML SOLN Suprep-Use as directed 354 mL 0  . nitroGLYCERIN (NITROSTAT) 0.4 MG SL tablet Place 1 tablet (0.4 mg total) under the tongue every 5 (five) minutes as needed. (Patient taking differently: Place 0.4 mg under the tongue every 5 (five) minutes as needed for chest pain. ) 30 tablet 2  . sevelamer (RENAGEL) 800 MG tablet Take 2,400 mg by mouth 3 (three) times daily with meals.    . traMADol (ULTRAM) 50 MG tablet Take 1 tablet (50 mg total) by mouth every 12 (twelve) hours as needed. (Patient  taking differently: Take 50 mg by mouth every 12 (twelve) hours as needed for moderate pain. ) 40 tablet 1   No current facility-administered medications on file prior to visit.    Allergies  Allergen Reactions  . Digoxin And Related Diarrhea and Other (See Comments)    TOXIC DRUG LEVELS Patient stated she almost died. Had flu like symptoms as well as diarrhea.  . Hydralazine Shortness Of Breath  . Penicillins Cross Reactors Hives and Other (See Comments)    HIGH FEVER  Has patient had a PCN reaction causing immediate rash, facial/tongue/throat swelling, SOB or lightheadedness with hypotension: No Has patient had a PCN reaction causing SEVERE RASH INVOLVING MUCUS MEMBRANES or SKIN NECROSIS  #  #  #  YES  #  #  #  Has patient had a PCN reaction that required hospitalization: Unk Has patient had a PCN reaction occurring within the last 10 years: Unk If all of the above answers are "NO", then may proceed with Cephalosporin use.   [name]  Lisinopril Other (See Comments)    Felt like she had the flu. Was very sick!!!  . Adhesive [Tape] Rash and Other (See Comments)    bruising   Objective: There were no vitals filed for this visit.   General: Patient is awake, alert, oriented x 3 and in no acute distress.  Dermatology: Skin is warm and dry bilateral with a continued healed ulceration  at right great toe. Minimal reactive callus to toe. No other acute signs of infection. All Nails are elongated and mycotic.    Vascular: Dorsalis Pedis pulse = 1/4 Bilateral,  Posterior Tibial pulse = 0/4 Bilateral,  Capillary Fill Time < 5 seconds  Neurologic: Protective sensation absent bilateral.  Musculosketal:  No pain with palpation to previous ulcerated area. No pain with compression to calves bilateral. Asymptomatic hammertoe bony deformities noted bilateral.  No results for input(s): GRAMSTAIN, LABORGA in the last 8760 hours.  Assessment and Plan:  Problem List Items Addressed This Visit       Endocrine   Diabetic neuropathy ([place])    Other Visit Diagnoses    Pain due to onychomycosis of toenails of both feet    -  Primary   PAD (peripheral artery disease) ([place])         -Examined patient -Nails x 10 debrided using sterile nail nipper without incident -Cont with daily inspection of feet in setting of Diabetes with PAD -Patient to return in 9 weeks for routine foot care or sooner if problems arise.  Landis Martins, DPM

## 2017-11-12 DIAGNOSIS — N2581 Secondary hyperparathyroidism of renal origin: Secondary | ICD-10-CM | POA: Diagnosis not present

## 2017-11-12 DIAGNOSIS — D509 Iron deficiency anemia, unspecified: Secondary | ICD-10-CM | POA: Diagnosis not present

## 2017-11-12 DIAGNOSIS — E1129 Type 2 diabetes mellitus with other diabetic kidney complication: Secondary | ICD-10-CM | POA: Diagnosis not present

## 2017-11-12 DIAGNOSIS — D631 Anemia in chronic kidney disease: Secondary | ICD-10-CM | POA: Diagnosis not present

## 2017-11-12 DIAGNOSIS — N186 End stage renal disease: Secondary | ICD-10-CM | POA: Diagnosis not present

## 2017-11-13 DIAGNOSIS — I129 Hypertensive chronic kidney disease with stage 1 through stage 4 chronic kidney disease, or unspecified chronic kidney disease: Secondary | ICD-10-CM | POA: Diagnosis not present

## 2017-11-13 DIAGNOSIS — Z992 Dependence on renal dialysis: Secondary | ICD-10-CM | POA: Diagnosis not present

## 2017-11-13 DIAGNOSIS — N186 End stage renal disease: Secondary | ICD-10-CM | POA: Diagnosis not present

## 2017-11-14 ENCOUNTER — Other Ambulatory Visit: Payer: Self-pay | Admitting: Family Medicine

## 2017-11-14 DIAGNOSIS — M109 Gout, unspecified: Secondary | ICD-10-CM

## 2017-11-14 DIAGNOSIS — N2581 Secondary hyperparathyroidism of renal origin: Secondary | ICD-10-CM | POA: Diagnosis not present

## 2017-11-14 DIAGNOSIS — N186 End stage renal disease: Secondary | ICD-10-CM | POA: Diagnosis not present

## 2017-11-14 DIAGNOSIS — D631 Anemia in chronic kidney disease: Secondary | ICD-10-CM | POA: Diagnosis not present

## 2017-11-14 DIAGNOSIS — E1129 Type 2 diabetes mellitus with other diabetic kidney complication: Secondary | ICD-10-CM | POA: Diagnosis not present

## 2017-11-14 DIAGNOSIS — D509 Iron deficiency anemia, unspecified: Secondary | ICD-10-CM | POA: Diagnosis not present

## 2017-11-17 DIAGNOSIS — D631 Anemia in chronic kidney disease: Secondary | ICD-10-CM | POA: Diagnosis not present

## 2017-11-17 DIAGNOSIS — N2581 Secondary hyperparathyroidism of renal origin: Secondary | ICD-10-CM | POA: Diagnosis not present

## 2017-11-17 DIAGNOSIS — N186 End stage renal disease: Secondary | ICD-10-CM | POA: Diagnosis not present

## 2017-11-17 DIAGNOSIS — D509 Iron deficiency anemia, unspecified: Secondary | ICD-10-CM | POA: Diagnosis not present

## 2017-11-17 DIAGNOSIS — E1129 Type 2 diabetes mellitus with other diabetic kidney complication: Secondary | ICD-10-CM | POA: Diagnosis not present

## 2017-11-19 DIAGNOSIS — N2581 Secondary hyperparathyroidism of renal origin: Secondary | ICD-10-CM | POA: Diagnosis not present

## 2017-11-19 DIAGNOSIS — N186 End stage renal disease: Secondary | ICD-10-CM | POA: Diagnosis not present

## 2017-11-19 DIAGNOSIS — D509 Iron deficiency anemia, unspecified: Secondary | ICD-10-CM | POA: Diagnosis not present

## 2017-11-19 DIAGNOSIS — D631 Anemia in chronic kidney disease: Secondary | ICD-10-CM | POA: Diagnosis not present

## 2017-11-19 DIAGNOSIS — E1129 Type 2 diabetes mellitus with other diabetic kidney complication: Secondary | ICD-10-CM | POA: Diagnosis not present

## 2017-11-20 ENCOUNTER — Other Ambulatory Visit: Payer: Self-pay | Admitting: Nurse Practitioner

## 2017-11-20 DIAGNOSIS — M0579 Rheumatoid arthritis with rheumatoid factor of multiple sites without organ or systems involvement: Secondary | ICD-10-CM | POA: Diagnosis not present

## 2017-11-20 DIAGNOSIS — M109 Gout, unspecified: Secondary | ICD-10-CM | POA: Diagnosis not present

## 2017-11-20 DIAGNOSIS — Z79899 Other long term (current) drug therapy: Secondary | ICD-10-CM | POA: Diagnosis not present

## 2017-11-20 DIAGNOSIS — Z6831 Body mass index (BMI) 31.0-31.9, adult: Secondary | ICD-10-CM | POA: Diagnosis not present

## 2017-11-20 DIAGNOSIS — E669 Obesity, unspecified: Secondary | ICD-10-CM | POA: Diagnosis not present

## 2017-11-20 DIAGNOSIS — M2550 Pain in unspecified joint: Secondary | ICD-10-CM | POA: Diagnosis not present

## 2017-11-20 DIAGNOSIS — N186 End stage renal disease: Secondary | ICD-10-CM | POA: Diagnosis not present

## 2017-11-21 ENCOUNTER — Other Ambulatory Visit: Payer: Self-pay | Admitting: Family Medicine

## 2017-11-21 DIAGNOSIS — N186 End stage renal disease: Secondary | ICD-10-CM | POA: Diagnosis not present

## 2017-11-21 DIAGNOSIS — Z1231 Encounter for screening mammogram for malignant neoplasm of breast: Secondary | ICD-10-CM

## 2017-11-21 DIAGNOSIS — D631 Anemia in chronic kidney disease: Secondary | ICD-10-CM | POA: Diagnosis not present

## 2017-11-21 DIAGNOSIS — D509 Iron deficiency anemia, unspecified: Secondary | ICD-10-CM | POA: Diagnosis not present

## 2017-11-21 DIAGNOSIS — E1129 Type 2 diabetes mellitus with other diabetic kidney complication: Secondary | ICD-10-CM | POA: Diagnosis not present

## 2017-11-21 DIAGNOSIS — N2581 Secondary hyperparathyroidism of renal origin: Secondary | ICD-10-CM | POA: Diagnosis not present

## 2017-11-24 DIAGNOSIS — E1129 Type 2 diabetes mellitus with other diabetic kidney complication: Secondary | ICD-10-CM | POA: Diagnosis not present

## 2017-11-24 DIAGNOSIS — D631 Anemia in chronic kidney disease: Secondary | ICD-10-CM | POA: Diagnosis not present

## 2017-11-24 DIAGNOSIS — N186 End stage renal disease: Secondary | ICD-10-CM | POA: Diagnosis not present

## 2017-11-24 DIAGNOSIS — N2581 Secondary hyperparathyroidism of renal origin: Secondary | ICD-10-CM | POA: Diagnosis not present

## 2017-11-24 DIAGNOSIS — D509 Iron deficiency anemia, unspecified: Secondary | ICD-10-CM | POA: Diagnosis not present

## 2017-11-26 DIAGNOSIS — N2581 Secondary hyperparathyroidism of renal origin: Secondary | ICD-10-CM | POA: Diagnosis not present

## 2017-11-26 DIAGNOSIS — D631 Anemia in chronic kidney disease: Secondary | ICD-10-CM | POA: Diagnosis not present

## 2017-11-26 DIAGNOSIS — N186 End stage renal disease: Secondary | ICD-10-CM | POA: Diagnosis not present

## 2017-11-26 DIAGNOSIS — E1129 Type 2 diabetes mellitus with other diabetic kidney complication: Secondary | ICD-10-CM | POA: Diagnosis not present

## 2017-11-26 DIAGNOSIS — D509 Iron deficiency anemia, unspecified: Secondary | ICD-10-CM | POA: Diagnosis not present

## 2017-11-27 ENCOUNTER — Telehealth: Payer: Self-pay | Admitting: General Practice

## 2017-11-27 ENCOUNTER — Ambulatory Visit (INDEPENDENT_AMBULATORY_CARE_PROVIDER_SITE_OTHER): Payer: Medicare Other | Admitting: Obstetrics & Gynecology

## 2017-11-27 ENCOUNTER — Other Ambulatory Visit (HOSPITAL_COMMUNITY)
Admission: RE | Admit: 2017-11-27 | Discharge: 2017-11-27 | Disposition: A | Discharge status: Home / Self Care | Payer: Medicare Other | Source: Ambulatory Visit | Attending: Obstetrics & Gynecology | Admitting: Obstetrics & Gynecology

## 2017-11-27 ENCOUNTER — Encounter: Payer: Self-pay | Admitting: Obstetrics & Gynecology

## 2017-11-27 ENCOUNTER — Other Ambulatory Visit (HOSPITAL_COMMUNITY)
Admission: RE | Admit: 2017-11-27 | Payer: Medicare Other | Source: Ambulatory Visit | Admitting: Obstetrics & Gynecology

## 2017-11-27 VITALS — Wt 220.0 lb

## 2017-11-27 DIAGNOSIS — N950 Postmenopausal bleeding: Secondary | ICD-10-CM | POA: Diagnosis not present

## 2017-11-27 DIAGNOSIS — N841 Polyp of cervix uteri: Secondary | ICD-10-CM | POA: Insufficient documentation

## 2017-11-27 DIAGNOSIS — N840 Polyp of corpus uteri: Secondary | ICD-10-CM | POA: Diagnosis not present

## 2017-11-27 DIAGNOSIS — N858 Other specified noninflammatory disorders of uterus: Secondary | ICD-10-CM | POA: Diagnosis not present

## 2017-11-27 NOTE — Telephone Encounter (Signed)
Patient called and left message on nurse voicemail line stating she just had a biopsy by Dr Brooke Stafford and is having some bleeding. Patient wants to know if this is normal.

## 2017-11-27 NOTE — Progress Notes (Signed)
   Subjective:    Patient ID: Brooke Stafford, female    DOB: 1955/07/03, 62 y.o.   MRN: [id number]  HPI 62 yo divorced P2 here for [facility] due to PMB. An u/s showed a possible uterine polyp, endometrium 6 mm thick.  Review of Systems     Objective:   Physical Exam Breathing, conversing, and ambulating normally Well nourished, well hydrated Black female, no apparent distress  Consent signed, time out done Cervix prepped with betadine and grasped with a single tooth tenaculum Speculum exam reveals 1 cm cervical polyp. I removed this with twisting after prepping the cervix with betadine. Uterus sounded to 9 cm Pipelle used for 2 passes with a small amount of tissue obtained. She tolerated the procedure well.     Assessment & Plan:  PMB- probably due to the cervical polyp Await pathology

## 2017-11-28 DIAGNOSIS — D509 Iron deficiency anemia, unspecified: Secondary | ICD-10-CM | POA: Diagnosis not present

## 2017-11-28 DIAGNOSIS — D631 Anemia in chronic kidney disease: Secondary | ICD-10-CM | POA: Diagnosis not present

## 2017-11-28 DIAGNOSIS — N186 End stage renal disease: Secondary | ICD-10-CM | POA: Diagnosis not present

## 2017-11-28 DIAGNOSIS — N2581 Secondary hyperparathyroidism of renal origin: Secondary | ICD-10-CM | POA: Diagnosis not present

## 2017-11-28 DIAGNOSIS — E1129 Type 2 diabetes mellitus with other diabetic kidney complication: Secondary | ICD-10-CM | POA: Diagnosis not present

## 2017-12-01 DIAGNOSIS — D631 Anemia in chronic kidney disease: Secondary | ICD-10-CM | POA: Diagnosis not present

## 2017-12-01 DIAGNOSIS — D509 Iron deficiency anemia, unspecified: Secondary | ICD-10-CM | POA: Diagnosis not present

## 2017-12-01 DIAGNOSIS — N186 End stage renal disease: Secondary | ICD-10-CM | POA: Diagnosis not present

## 2017-12-01 DIAGNOSIS — E1129 Type 2 diabetes mellitus with other diabetic kidney complication: Secondary | ICD-10-CM | POA: Diagnosis not present

## 2017-12-01 DIAGNOSIS — N2581 Secondary hyperparathyroidism of renal origin: Secondary | ICD-10-CM | POA: Diagnosis not present

## 2017-12-03 DIAGNOSIS — N186 End stage renal disease: Secondary | ICD-10-CM | POA: Diagnosis not present

## 2017-12-03 DIAGNOSIS — E039 Hypothyroidism, unspecified: Secondary | ICD-10-CM | POA: Diagnosis not present

## 2017-12-03 DIAGNOSIS — E1129 Type 2 diabetes mellitus with other diabetic kidney complication: Secondary | ICD-10-CM | POA: Diagnosis not present

## 2017-12-03 DIAGNOSIS — D509 Iron deficiency anemia, unspecified: Secondary | ICD-10-CM | POA: Diagnosis not present

## 2017-12-03 DIAGNOSIS — N2581 Secondary hyperparathyroidism of renal origin: Secondary | ICD-10-CM | POA: Diagnosis not present

## 2017-12-03 DIAGNOSIS — D631 Anemia in chronic kidney disease: Secondary | ICD-10-CM | POA: Diagnosis not present

## 2017-12-05 DIAGNOSIS — E1129 Type 2 diabetes mellitus with other diabetic kidney complication: Secondary | ICD-10-CM | POA: Diagnosis not present

## 2017-12-05 DIAGNOSIS — D509 Iron deficiency anemia, unspecified: Secondary | ICD-10-CM | POA: Diagnosis not present

## 2017-12-05 DIAGNOSIS — D631 Anemia in chronic kidney disease: Secondary | ICD-10-CM | POA: Diagnosis not present

## 2017-12-05 DIAGNOSIS — N186 End stage renal disease: Secondary | ICD-10-CM | POA: Diagnosis not present

## 2017-12-05 DIAGNOSIS — N2581 Secondary hyperparathyroidism of renal origin: Secondary | ICD-10-CM | POA: Diagnosis not present

## 2017-12-08 DIAGNOSIS — D631 Anemia in chronic kidney disease: Secondary | ICD-10-CM | POA: Diagnosis not present

## 2017-12-08 DIAGNOSIS — N186 End stage renal disease: Secondary | ICD-10-CM | POA: Diagnosis not present

## 2017-12-08 DIAGNOSIS — E1129 Type 2 diabetes mellitus with other diabetic kidney complication: Secondary | ICD-10-CM | POA: Diagnosis not present

## 2017-12-08 DIAGNOSIS — D509 Iron deficiency anemia, unspecified: Secondary | ICD-10-CM | POA: Diagnosis not present

## 2017-12-08 DIAGNOSIS — N2581 Secondary hyperparathyroidism of renal origin: Secondary | ICD-10-CM | POA: Diagnosis not present

## 2017-12-10 DIAGNOSIS — E1129 Type 2 diabetes mellitus with other diabetic kidney complication: Secondary | ICD-10-CM | POA: Diagnosis not present

## 2017-12-10 DIAGNOSIS — D631 Anemia in chronic kidney disease: Secondary | ICD-10-CM | POA: Diagnosis not present

## 2017-12-10 DIAGNOSIS — N186 End stage renal disease: Secondary | ICD-10-CM | POA: Diagnosis not present

## 2017-12-10 DIAGNOSIS — N2581 Secondary hyperparathyroidism of renal origin: Secondary | ICD-10-CM | POA: Diagnosis not present

## 2017-12-10 DIAGNOSIS — D509 Iron deficiency anemia, unspecified: Secondary | ICD-10-CM | POA: Diagnosis not present

## 2017-12-12 DIAGNOSIS — D509 Iron deficiency anemia, unspecified: Secondary | ICD-10-CM | POA: Diagnosis not present

## 2017-12-12 DIAGNOSIS — E1129 Type 2 diabetes mellitus with other diabetic kidney complication: Secondary | ICD-10-CM | POA: Diagnosis not present

## 2017-12-12 DIAGNOSIS — D631 Anemia in chronic kidney disease: Secondary | ICD-10-CM | POA: Diagnosis not present

## 2017-12-12 DIAGNOSIS — N186 End stage renal disease: Secondary | ICD-10-CM | POA: Diagnosis not present

## 2017-12-12 DIAGNOSIS — N2581 Secondary hyperparathyroidism of renal origin: Secondary | ICD-10-CM | POA: Diagnosis not present

## 2017-12-14 DIAGNOSIS — Z992 Dependence on renal dialysis: Secondary | ICD-10-CM | POA: Diagnosis not present

## 2017-12-14 DIAGNOSIS — I129 Hypertensive chronic kidney disease with stage 1 through stage 4 chronic kidney disease, or unspecified chronic kidney disease: Secondary | ICD-10-CM | POA: Diagnosis not present

## 2017-12-14 DIAGNOSIS — N186 End stage renal disease: Secondary | ICD-10-CM | POA: Diagnosis not present

## 2017-12-15 DIAGNOSIS — N186 End stage renal disease: Secondary | ICD-10-CM | POA: Diagnosis not present

## 2017-12-15 DIAGNOSIS — D631 Anemia in chronic kidney disease: Secondary | ICD-10-CM | POA: Diagnosis not present

## 2017-12-15 DIAGNOSIS — E1129 Type 2 diabetes mellitus with other diabetic kidney complication: Secondary | ICD-10-CM | POA: Diagnosis not present

## 2017-12-15 DIAGNOSIS — D509 Iron deficiency anemia, unspecified: Secondary | ICD-10-CM | POA: Diagnosis not present

## 2017-12-15 DIAGNOSIS — N2581 Secondary hyperparathyroidism of renal origin: Secondary | ICD-10-CM | POA: Diagnosis not present

## 2017-12-17 DIAGNOSIS — D509 Iron deficiency anemia, unspecified: Secondary | ICD-10-CM | POA: Diagnosis not present

## 2017-12-17 DIAGNOSIS — E1129 Type 2 diabetes mellitus with other diabetic kidney complication: Secondary | ICD-10-CM | POA: Diagnosis not present

## 2017-12-17 DIAGNOSIS — N2581 Secondary hyperparathyroidism of renal origin: Secondary | ICD-10-CM | POA: Diagnosis not present

## 2017-12-17 DIAGNOSIS — N186 End stage renal disease: Secondary | ICD-10-CM | POA: Diagnosis not present

## 2017-12-17 DIAGNOSIS — D631 Anemia in chronic kidney disease: Secondary | ICD-10-CM | POA: Diagnosis not present

## 2017-12-18 ENCOUNTER — Other Ambulatory Visit: Payer: Self-pay | Admitting: Nurse Practitioner

## 2017-12-19 DIAGNOSIS — D509 Iron deficiency anemia, unspecified: Secondary | ICD-10-CM | POA: Diagnosis not present

## 2017-12-19 DIAGNOSIS — N186 End stage renal disease: Secondary | ICD-10-CM | POA: Diagnosis not present

## 2017-12-19 DIAGNOSIS — E1129 Type 2 diabetes mellitus with other diabetic kidney complication: Secondary | ICD-10-CM | POA: Diagnosis not present

## 2017-12-19 DIAGNOSIS — N2581 Secondary hyperparathyroidism of renal origin: Secondary | ICD-10-CM | POA: Diagnosis not present

## 2017-12-19 DIAGNOSIS — D631 Anemia in chronic kidney disease: Secondary | ICD-10-CM | POA: Diagnosis not present

## 2017-12-22 DIAGNOSIS — D631 Anemia in chronic kidney disease: Secondary | ICD-10-CM | POA: Diagnosis not present

## 2017-12-22 DIAGNOSIS — D509 Iron deficiency anemia, unspecified: Secondary | ICD-10-CM | POA: Diagnosis not present

## 2017-12-22 DIAGNOSIS — N2581 Secondary hyperparathyroidism of renal origin: Secondary | ICD-10-CM | POA: Diagnosis not present

## 2017-12-22 DIAGNOSIS — N186 End stage renal disease: Secondary | ICD-10-CM | POA: Diagnosis not present

## 2017-12-22 DIAGNOSIS — E1129 Type 2 diabetes mellitus with other diabetic kidney complication: Secondary | ICD-10-CM | POA: Diagnosis not present

## 2017-12-23 ENCOUNTER — Telehealth: Payer: Self-pay | Admitting: Internal Medicine

## 2017-12-23 MED ORDER — BASAGLAR KWIKPEN 100 UNIT/ML ~~LOC~~ SOPN: PEN_INJECTOR | SUBCUTANEOUS | 3 refills | Status: DC

## 2017-12-23 NOTE — Telephone Encounter (Signed)
Insulin Glargine (BASAGLAR KWIKPEN) 100 UNIT/ML SOPN   Patient is needing insulin sent into the pharmacy     [facility] [phone] - [place], [address] AT [address]

## 2017-12-23 NOTE — Telephone Encounter (Signed)
Sent!

## 2017-12-24 DIAGNOSIS — D509 Iron deficiency anemia, unspecified: Secondary | ICD-10-CM | POA: Diagnosis not present

## 2017-12-24 DIAGNOSIS — E1129 Type 2 diabetes mellitus with other diabetic kidney complication: Secondary | ICD-10-CM | POA: Diagnosis not present

## 2017-12-24 DIAGNOSIS — D631 Anemia in chronic kidney disease: Secondary | ICD-10-CM | POA: Diagnosis not present

## 2017-12-24 DIAGNOSIS — N2581 Secondary hyperparathyroidism of renal origin: Secondary | ICD-10-CM | POA: Diagnosis not present

## 2017-12-24 DIAGNOSIS — N186 End stage renal disease: Secondary | ICD-10-CM | POA: Diagnosis not present

## 2017-12-26 DIAGNOSIS — D631 Anemia in chronic kidney disease: Secondary | ICD-10-CM | POA: Diagnosis not present

## 2017-12-26 DIAGNOSIS — D509 Iron deficiency anemia, unspecified: Secondary | ICD-10-CM | POA: Diagnosis not present

## 2017-12-26 DIAGNOSIS — N186 End stage renal disease: Secondary | ICD-10-CM | POA: Diagnosis not present

## 2017-12-26 DIAGNOSIS — N2581 Secondary hyperparathyroidism of renal origin: Secondary | ICD-10-CM | POA: Diagnosis not present

## 2017-12-26 DIAGNOSIS — E1129 Type 2 diabetes mellitus with other diabetic kidney complication: Secondary | ICD-10-CM | POA: Diagnosis not present

## 2017-12-29 DIAGNOSIS — D509 Iron deficiency anemia, unspecified: Secondary | ICD-10-CM | POA: Diagnosis not present

## 2017-12-29 DIAGNOSIS — E1129 Type 2 diabetes mellitus with other diabetic kidney complication: Secondary | ICD-10-CM | POA: Diagnosis not present

## 2017-12-29 DIAGNOSIS — D631 Anemia in chronic kidney disease: Secondary | ICD-10-CM | POA: Diagnosis not present

## 2017-12-29 DIAGNOSIS — N2581 Secondary hyperparathyroidism of renal origin: Secondary | ICD-10-CM | POA: Diagnosis not present

## 2017-12-29 DIAGNOSIS — N186 End stage renal disease: Secondary | ICD-10-CM | POA: Diagnosis not present

## 2017-12-30 ENCOUNTER — Ambulatory Visit
Admission: RE | Admit: 2017-12-30 | Discharge: 2017-12-30 | Disposition: A | Discharge status: Home / Self Care | Payer: Medicare Other | Source: Ambulatory Visit | Attending: Family Medicine | Admitting: Family Medicine

## 2017-12-30 DIAGNOSIS — Z1231 Encounter for screening mammogram for malignant neoplasm of breast: Secondary | ICD-10-CM | POA: Diagnosis not present

## 2017-12-31 ENCOUNTER — Other Ambulatory Visit: Payer: Self-pay | Admitting: Family Medicine

## 2017-12-31 DIAGNOSIS — R928 Other abnormal and inconclusive findings on diagnostic imaging of breast: Secondary | ICD-10-CM

## 2017-12-31 DIAGNOSIS — N186 End stage renal disease: Secondary | ICD-10-CM | POA: Diagnosis not present

## 2017-12-31 DIAGNOSIS — N2581 Secondary hyperparathyroidism of renal origin: Secondary | ICD-10-CM | POA: Diagnosis not present

## 2017-12-31 DIAGNOSIS — D509 Iron deficiency anemia, unspecified: Secondary | ICD-10-CM | POA: Diagnosis not present

## 2017-12-31 DIAGNOSIS — E1129 Type 2 diabetes mellitus with other diabetic kidney complication: Secondary | ICD-10-CM | POA: Diagnosis not present

## 2017-12-31 DIAGNOSIS — D631 Anemia in chronic kidney disease: Secondary | ICD-10-CM | POA: Diagnosis not present

## 2018-01-01 ENCOUNTER — Telehealth: Payer: Self-pay

## 2018-01-01 NOTE — Telephone Encounter (Signed)
Patient was called and informed of mammogram results. Patient was also informed that the breast center will contact her to make an appointment for additional testing.

## 2018-01-01 NOTE — Telephone Encounter (Signed)
-----   Message from Charlott Rakes, MD sent at 12/30/2017  5:31 PM EDT ----- Mammogram revealed calcifications and she will be contacted by the breast center to schedule additional testing.

## 2018-01-02 DIAGNOSIS — E1129 Type 2 diabetes mellitus with other diabetic kidney complication: Secondary | ICD-10-CM | POA: Diagnosis not present

## 2018-01-02 DIAGNOSIS — N186 End stage renal disease: Secondary | ICD-10-CM | POA: Diagnosis not present

## 2018-01-02 DIAGNOSIS — N2581 Secondary hyperparathyroidism of renal origin: Secondary | ICD-10-CM | POA: Diagnosis not present

## 2018-01-02 DIAGNOSIS — D631 Anemia in chronic kidney disease: Secondary | ICD-10-CM | POA: Diagnosis not present

## 2018-01-02 DIAGNOSIS — D509 Iron deficiency anemia, unspecified: Secondary | ICD-10-CM | POA: Diagnosis not present

## 2018-01-05 DIAGNOSIS — D509 Iron deficiency anemia, unspecified: Secondary | ICD-10-CM | POA: Diagnosis not present

## 2018-01-05 DIAGNOSIS — D631 Anemia in chronic kidney disease: Secondary | ICD-10-CM | POA: Diagnosis not present

## 2018-01-05 DIAGNOSIS — N2581 Secondary hyperparathyroidism of renal origin: Secondary | ICD-10-CM | POA: Diagnosis not present

## 2018-01-05 DIAGNOSIS — N186 End stage renal disease: Secondary | ICD-10-CM | POA: Diagnosis not present

## 2018-01-05 DIAGNOSIS — E1129 Type 2 diabetes mellitus with other diabetic kidney complication: Secondary | ICD-10-CM | POA: Diagnosis not present

## 2018-01-06 ENCOUNTER — Ambulatory Visit
Admission: RE | Admit: 2018-01-06 | Discharge: 2018-01-06 | Disposition: A | Discharge status: Home / Self Care | Payer: Medicare Other | Source: Ambulatory Visit | Attending: Family Medicine | Admitting: Family Medicine

## 2018-01-06 DIAGNOSIS — R928 Other abnormal and inconclusive findings on diagnostic imaging of breast: Secondary | ICD-10-CM

## 2018-01-06 DIAGNOSIS — R921 Mammographic calcification found on diagnostic imaging of breast: Secondary | ICD-10-CM | POA: Diagnosis not present

## 2018-01-07 DIAGNOSIS — E1129 Type 2 diabetes mellitus with other diabetic kidney complication: Secondary | ICD-10-CM | POA: Diagnosis not present

## 2018-01-07 DIAGNOSIS — N2581 Secondary hyperparathyroidism of renal origin: Secondary | ICD-10-CM | POA: Diagnosis not present

## 2018-01-07 DIAGNOSIS — N186 End stage renal disease: Secondary | ICD-10-CM | POA: Diagnosis not present

## 2018-01-07 DIAGNOSIS — D509 Iron deficiency anemia, unspecified: Secondary | ICD-10-CM | POA: Diagnosis not present

## 2018-01-07 DIAGNOSIS — D631 Anemia in chronic kidney disease: Secondary | ICD-10-CM | POA: Diagnosis not present

## 2018-01-08 ENCOUNTER — Other Ambulatory Visit: Payer: Self-pay | Admitting: Cardiovascular Disease

## 2018-01-09 DIAGNOSIS — D509 Iron deficiency anemia, unspecified: Secondary | ICD-10-CM | POA: Diagnosis not present

## 2018-01-09 DIAGNOSIS — D631 Anemia in chronic kidney disease: Secondary | ICD-10-CM | POA: Diagnosis not present

## 2018-01-09 DIAGNOSIS — N186 End stage renal disease: Secondary | ICD-10-CM | POA: Diagnosis not present

## 2018-01-09 DIAGNOSIS — N2581 Secondary hyperparathyroidism of renal origin: Secondary | ICD-10-CM | POA: Diagnosis not present

## 2018-01-09 DIAGNOSIS — E1129 Type 2 diabetes mellitus with other diabetic kidney complication: Secondary | ICD-10-CM | POA: Diagnosis not present

## 2018-01-12 DIAGNOSIS — N2581 Secondary hyperparathyroidism of renal origin: Secondary | ICD-10-CM | POA: Diagnosis not present

## 2018-01-12 DIAGNOSIS — E1129 Type 2 diabetes mellitus with other diabetic kidney complication: Secondary | ICD-10-CM | POA: Diagnosis not present

## 2018-01-12 DIAGNOSIS — D509 Iron deficiency anemia, unspecified: Secondary | ICD-10-CM | POA: Diagnosis not present

## 2018-01-12 DIAGNOSIS — D631 Anemia in chronic kidney disease: Secondary | ICD-10-CM | POA: Diagnosis not present

## 2018-01-12 DIAGNOSIS — N186 End stage renal disease: Secondary | ICD-10-CM | POA: Diagnosis not present

## 2018-01-13 ENCOUNTER — Encounter: Payer: Self-pay | Admitting: Sports Medicine

## 2018-01-13 ENCOUNTER — Encounter: Payer: Self-pay | Admitting: Internal Medicine

## 2018-01-13 ENCOUNTER — Ambulatory Visit (INDEPENDENT_AMBULATORY_CARE_PROVIDER_SITE_OTHER): Payer: Medicare Other | Admitting: Internal Medicine

## 2018-01-13 ENCOUNTER — Ambulatory Visit (INDEPENDENT_AMBULATORY_CARE_PROVIDER_SITE_OTHER): Payer: Medicare Other | Admitting: Sports Medicine

## 2018-01-13 VITALS — BP 120/60 | HR 72 | Ht 70.0 in | Wt 223.0 lb

## 2018-01-13 DIAGNOSIS — Z23 Encounter for immunization: Secondary | ICD-10-CM | POA: Diagnosis not present

## 2018-01-13 DIAGNOSIS — E785 Hyperlipidemia, unspecified: Secondary | ICD-10-CM | POA: Diagnosis not present

## 2018-01-13 DIAGNOSIS — E1142 Type 2 diabetes mellitus with diabetic polyneuropathy: Secondary | ICD-10-CM | POA: Diagnosis not present

## 2018-01-13 DIAGNOSIS — M21619 Bunion of unspecified foot: Secondary | ICD-10-CM

## 2018-01-13 DIAGNOSIS — I739 Peripheral vascular disease, unspecified: Secondary | ICD-10-CM | POA: Diagnosis not present

## 2018-01-13 DIAGNOSIS — I129 Hypertensive chronic kidney disease with stage 1 through stage 4 chronic kidney disease, or unspecified chronic kidney disease: Secondary | ICD-10-CM | POA: Diagnosis not present

## 2018-01-13 DIAGNOSIS — Z992 Dependence on renal dialysis: Secondary | ICD-10-CM | POA: Diagnosis not present

## 2018-01-13 DIAGNOSIS — M79675 Pain in left toe(s): Secondary | ICD-10-CM | POA: Diagnosis not present

## 2018-01-13 DIAGNOSIS — M79674 Pain in right toe(s): Secondary | ICD-10-CM

## 2018-01-13 DIAGNOSIS — N186 End stage renal disease: Secondary | ICD-10-CM

## 2018-01-13 DIAGNOSIS — E039 Hypothyroidism, unspecified: Secondary | ICD-10-CM

## 2018-01-13 DIAGNOSIS — I2510 Atherosclerotic heart disease of native coronary artery without angina pectoris: Secondary | ICD-10-CM | POA: Diagnosis not present

## 2018-01-13 DIAGNOSIS — M2040 Other hammer toe(s) (acquired), unspecified foot: Secondary | ICD-10-CM | POA: Diagnosis not present

## 2018-01-13 DIAGNOSIS — Z9861 Coronary angioplasty status: Secondary | ICD-10-CM | POA: Diagnosis not present

## 2018-01-13 DIAGNOSIS — E1122 Type 2 diabetes mellitus with diabetic chronic kidney disease: Secondary | ICD-10-CM

## 2018-01-13 DIAGNOSIS — B351 Tinea unguium: Secondary | ICD-10-CM | POA: Diagnosis not present

## 2018-01-13 LAB — POCT GLYCOSYLATED HEMOGLOBIN (HGB A1C): Hemoglobin A1C: 5.2 % (ref 4.0–5.6)

## 2018-01-13 MED ORDER — INSULIN ASPART 100 UNIT/ML FLEXPEN: 5.0000 [IU] | PEN_INJECTOR | Freq: Three times a day (TID) | SUBCUTANEOUS | 11 refills | Status: AC

## 2018-01-13 MED ORDER — BASAGLAR KWIKPEN 100 UNIT/ML ~~LOC~~ SOPN: PEN_INJECTOR | SUBCUTANEOUS | 3 refills | Status: DC
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
## 2018-01-13 NOTE — Addendum Note (Signed)
Addended by: Cardell Peach I on: 01/13/2018 03:37 PM   Modules accepted: Orders

## 2018-01-13 NOTE — Patient Instructions (Addendum)
Please increase: - Basaglar to 16 units at bedtime  Please change: - Novolog   Before b'fast: 8 units   Before lunch: 5 units  Before dinner: 5 units  Please let me know if the sugars are consistently <80 or >200.  Please return in 6 months with your sugar log.

## 2018-01-13 NOTE — Progress Notes (Signed)
Subjective: Brooke Stafford is a 62 y.o. female patient seen in office for follow up nail care and requests diabetic shoes. Reports that she had pain to nails especially at right 1st toe and 3rd toe and that sometimes they feel funny however no changes from previous, Patient has no other pedal complaints at this time.  FBS this AM not recorded.  Patient is also a dialysis patient and is on Plavix, follows with vascular as previously noted.   Patient Active Problem List   Diagnosis Date Noted  . Heme positive stool   . Benign neoplasm of ascending colon   . Benign neoplasm of transverse colon   . Benign neoplasm of descending colon   . Hypercholesterolemia 06/18/2017  . Coronary artery disease of autologous vein bypass graft with stable angina pectoris ([place]) 06/18/2017  . Diabetic neuropathy ([place]) 04/17/2017  . GERD (gastroesophageal reflux disease) 04/17/2017  . Foot ulcer, right ([place]) 11/28/2016  . Cervical polyp 11/12/2016  . AV (arteriovenous fistula) ([place]) 06/17/2016  . Mixed hyperlipidemia 05/21/2016  . ESRD (end stage renal disease) ([place])   . Hypokalemia 05/03/2016  . COPD exacerbation ([place]) 05/03/2016  . COPD (chronic obstructive pulmonary disease) ([place]) 04/11/2016  . CAP (community acquired pneumonia) 03/26/2016  . Acute respiratory failure with hypoxia ([place]) 03/26/2016  . Acute on chronic combined systolic and diastolic CHF (congestive heart failure) ([place]) 03/26/2016  . Medication management 03/21/2016  . Anemia of chronic disease 12/27/2015  . Acute on chronic systolic CHF (congestive heart failure) ([place])   . Acute CHF ([place]) 12/18/2015  . Hypertensive heart and renal disease with CHF and ESRD ([place]) 12/18/2015  . Constipation 12/18/2015  . Anxiety 11/02/2015  . Posterior circulation stroke ([place]) 11/02/2015  . Abnormality of gait 10/23/2015  . Depression 09/28/2015  . Diabetic retinopathy ([place]) 09/13/2015  . Grief reaction 08/24/2015  . Creatinine elevation  07/25/2015  . Chronic combined systolic and diastolic congestive heart failure ([place]) 07/25/2015  . Rash of hands 02/23/2015  . Essential hypertension   . Angina pectoris ([place]) 12/12/2014  . Abnormal nuclear stress test   . Acute combined systolic and diastolic heart failure ([place]) 12/02/2014  . Seasonal allergies 08/11/2014  . Gout 08/11/2014  . Encounter for screening mammogram for breast cancer 05/16/2014  . Screening for colon cancer 05/16/2014  . Type 2 diabetes mellitus with ESRD (end-stage renal disease) ([place]) 01/27/2014  . Atopic eczema 01/27/2014  . Precordial pain, atypical, negative MI, Musculature Skeletal pain  11/08/2013  . CKD (chronic kidney disease) stage 5, GFR less than 15 ml/min (HCC) 11/08/2013  . Unstable angina ([place]) 09/28/2013  . Ischemic cardiomyopathy- new drop in EF 08/30/2013  . Chest pain 08/04/2013  . CAD -S/P PCI June 2015 and 12/14/14 02/01/2013  . Hypothyroidism, acquired 02/01/2013  . PVD, LSFA PTA 12/12 04/06/2011  . Hx of CABG x 6 2002 04/06/2011  . Dyslipidemia 04/06/2011   Current Outpatient Medications on File Prior to Visit  Medication Sig Dispense Refill  . allopurinol (ZYLOPRIM) 300 MG tablet TAKE 1 TABLET BY MOUTH ONCE DAILY 90 tablet 0  . aspirin 81 MG chewable tablet Chew 1 tablet (81 mg total) by mouth daily. 30 tablet 10  . atorvastatin (LIPITOR) 40 MG tablet Take 1 tablet (40 mg total) by mouth every evening. 90 tablet 3  . AURYXIA 1 GM 210 MG(Fe) tablet TAKE 2 TABLETS BY MOUTH THREE TIMES A DAY WITH MEALS.   SWALLOW WHOLE, DO NOT CHEW OR CRUSH MEDICATION  1  . busPIRone (  BUSPAR) 10 MG tablet Take 1 tablet (10 mg total) by mouth 3 (three) times daily. 90 tablet 6  . cetirizine (ZYRTEC) 10 MG tablet Take 10 mg by mouth daily.    . clopidogrel (PLAVIX) 75 MG tablet Take 1 tablet (75 mg total) by mouth daily. 30 tablet 6  . fluticasone (FLONASE) 50 MCG/ACT nasal spray Place 1 spray into both nostrils daily. (Patient taking differently: Place  2 sprays into both nostrils daily. ) 48 g 2  . gabapentin (NEURONTIN) 400 MG capsule Take 1 capsule (400 mg total) by mouth at bedtime. 30 capsule 6  . hydroxychloroquine (PLAQUENIL) 200 MG tablet TAKE 2 TABLETS BY MOUTH EVERY DAY WITH FOOD OR MILK  0  . hydrOXYzine (ATARAX/VISTARIL) 25 MG tablet Take 0.5 tablets (12.5 mg total) by mouth daily as needed. (Patient taking differently: Take 12.5 mg by mouth daily as needed for anxiety. ) 30 tablet 0  . insulin aspart (NOVOLOG FLEXPEN) 100 UNIT/ML FlexPen Inject 5 Units into the skin 3 (three) times daily with meals. 15 mL 0  . Insulin Glargine (BASAGLAR KWIKPEN) 100 UNIT/ML SOPN INJECT 14 UNITS INTO THE SKIN AT BEDTIME 45 mL 3  . Insulin Pen Needle 32G X 4 MM MISC Use 4x a day 200 each 3  . ipratropium (ATROVENT) 0.03 % nasal spray Place 2 sprays into both nostrils every 12 (twelve) hours. (Patient taking differently: Place 2 sprays into both nostrils 2 (two) times daily as needed for rhinitis. ) 30 mL 6  . isosorbide mononitrate (IMDUR) 60 MG 24 hr tablet Take 2 tablets (120 mg total) by mouth daily. 180 tablet 1  . levothyroxine (SYNTHROID, LEVOTHROID) 25 MCG tablet take 1 tablet by mouth every morning before BREAKFAST (Patient taking differently: Take 25 mcg by mouth daily before breakfast. ) 30 tablet 6  . lidocaine-prilocaine (EMLA) cream Apply 1 application topically as needed (for port access).   4  . LINZESS 145 MCG CAPS capsule TAKE 1 CAPSULE(145 MCG) BY MOUTH DAILY BEFORE BREAKFAST 30 capsule 0  . metoprolol tartrate (LOPRESSOR) 25 MG tablet TAKE 1/2 TABLET(12.5 MG) BY MOUTH TWICE DAILY 90 tablet 0  . Na Sulfate-K Sulfate-Mg Sulf 17.5-3.13-1.6 GM/177ML SOLN Suprep-Use as directed 354 mL 0  . nitroGLYCERIN (NITROSTAT) 0.4 MG SL tablet Place 1 tablet (0.4 mg total) under the tongue every 5 (five) minutes as needed. (Patient taking differently: Place 0.4 mg under the tongue every 5 (five) minutes as needed for chest pain. ) 30 tablet 2  . ORENCIA  CLICKJECT 094 MG/ML SOAJ     . RENVELA 800 MG tablet TAKE 3 TABLETS BY MOUTH THREE TIMES DAILY WITH MEALS  3  . sevelamer (RENAGEL) 800 MG tablet Take 2,400 mg by mouth 3 (three) times daily with meals.    . traMADol (ULTRAM) 50 MG tablet Take 1 tablet (50 mg total) by mouth every 12 (twelve) hours as needed. (Patient taking differently: Take 50 mg by mouth every 12 (twelve) hours as needed for moderate pain. ) 40 tablet 1   No current facility-administered medications on file prior to visit.    Allergies  Allergen Reactions  . Digoxin And Related Diarrhea and Other (See Comments)    TOXIC DRUG LEVELS Patient stated she almost died. Had flu like symptoms as well as diarrhea.  . Hydralazine Shortness Of Breath  . Penicillins Cross Reactors Hives and Other (See Comments)    HIGH FEVER  Has patient had a PCN reaction causing immediate rash, facial/tongue/throat swelling, SOB or  lightheadedness with hypotension: No Has patient had a PCN reaction causing SEVERE RASH INVOLVING MUCUS MEMBRANES or SKIN NECROSIS  #  #  #  YES  #  #  #  Has patient had a PCN reaction that required hospitalization: Unk Has patient had a PCN reaction occurring within the last 10 years: Unk If all of the above answers are "NO", then may proceed with Cephalosporin use.   [name] Lisinopril Other (See Comments)    Felt like she had the flu. Was very sick!!!  . Adhesive [Tape] Rash and Other (See Comments)    bruising   Objective: There were no vitals filed for this visit.   General: Patient is awake, alert, oriented x 3 and in no acute distress.  Dermatology: Skin is warm and dry bilateral with a continued healed ulceration  at right great toe. Minimal reactive callus to toe. No other acute signs of infection. All Nails are elongated and mycotic.    Vascular: Dorsalis Pedis pulse = 1/4 Bilateral,  Posterior Tibial pulse = 0/4 Bilateral,  Capillary Fill Time < 5 seconds  Neurologic: Protective sensation absent  bilateral.  Musculosketal:  No pain with palpation to right first and third toes and no areas of concern even though abnormal feeling to these toes per patient report, no pain with compression to calves bilateral. Asymptomatic bunion and hammertoe and pes planus bony deformities noted bilateral.  No results for input(s): GRAMSTAIN, LABORGA in the last 8760 hours.  Assessment and Plan:  Problem List Items Addressed This Visit      Endocrine   Diabetic neuropathy ([place])    Other Visit Diagnoses    Pain due to onychomycosis of toenails of both feet    -  Primary   PAD (peripheral artery disease) (HCC)       Hammer toe, unspecified laterality       Bunion         -Examined patient -Nails x 10 debrided using sterile nail nipper without incident -Cont with daily inspection of feet in setting of Diabetes with PAD -Safe step diabetic shoe order form was completed; office to contact primary care for approval / certification;  Office to arrange shoe fitting and dispensing. -Patient to return in 9 weeks for routine foot care or sooner if problems arise.  Landis Martins, DPM

## 2018-01-14 DIAGNOSIS — N186 End stage renal disease: Secondary | ICD-10-CM | POA: Diagnosis not present

## 2018-01-14 DIAGNOSIS — D631 Anemia in chronic kidney disease: Secondary | ICD-10-CM | POA: Diagnosis not present

## 2018-01-14 DIAGNOSIS — E1129 Type 2 diabetes mellitus with other diabetic kidney complication: Secondary | ICD-10-CM | POA: Diagnosis not present

## 2018-01-14 DIAGNOSIS — N2581 Secondary hyperparathyroidism of renal origin: Secondary | ICD-10-CM | POA: Diagnosis not present

## 2018-01-16 DIAGNOSIS — N186 End stage renal disease: Secondary | ICD-10-CM | POA: Diagnosis not present

## 2018-01-16 DIAGNOSIS — E1129 Type 2 diabetes mellitus with other diabetic kidney complication: Secondary | ICD-10-CM | POA: Diagnosis not present

## 2018-01-16 DIAGNOSIS — D631 Anemia in chronic kidney disease: Secondary | ICD-10-CM | POA: Diagnosis not present

## 2018-01-16 DIAGNOSIS — N2581 Secondary hyperparathyroidism of renal origin: Secondary | ICD-10-CM | POA: Diagnosis not present

## 2018-01-19 DIAGNOSIS — D631 Anemia in chronic kidney disease: Secondary | ICD-10-CM | POA: Diagnosis not present

## 2018-01-19 DIAGNOSIS — E1129 Type 2 diabetes mellitus with other diabetic kidney complication: Secondary | ICD-10-CM | POA: Diagnosis not present

## 2018-01-19 DIAGNOSIS — N186 End stage renal disease: Secondary | ICD-10-CM | POA: Diagnosis not present

## 2018-01-19 DIAGNOSIS — N2581 Secondary hyperparathyroidism of renal origin: Secondary | ICD-10-CM | POA: Diagnosis not present

## 2018-01-21 DIAGNOSIS — N2581 Secondary hyperparathyroidism of renal origin: Secondary | ICD-10-CM | POA: Diagnosis not present

## 2018-01-21 DIAGNOSIS — D631 Anemia in chronic kidney disease: Secondary | ICD-10-CM | POA: Diagnosis not present

## 2018-01-21 DIAGNOSIS — N186 End stage renal disease: Secondary | ICD-10-CM | POA: Diagnosis not present

## 2018-01-21 DIAGNOSIS — E1129 Type 2 diabetes mellitus with other diabetic kidney complication: Secondary | ICD-10-CM | POA: Diagnosis not present

## 2018-01-22 DIAGNOSIS — H3561 Retinal hemorrhage, right eye: Secondary | ICD-10-CM | POA: Diagnosis not present

## 2018-01-22 DIAGNOSIS — Z961 Presence of intraocular lens: Secondary | ICD-10-CM | POA: Diagnosis not present

## 2018-01-22 DIAGNOSIS — E113411 Type 2 diabetes mellitus with severe nonproliferative diabetic retinopathy with macular edema, right eye: Secondary | ICD-10-CM | POA: Diagnosis not present

## 2018-01-22 DIAGNOSIS — E113412 Type 2 diabetes mellitus with severe nonproliferative diabetic retinopathy with macular edema, left eye: Secondary | ICD-10-CM | POA: Diagnosis not present

## 2018-01-22 LAB — HM DIABETES EYE EXAM

## 2018-01-23 DIAGNOSIS — E1129 Type 2 diabetes mellitus with other diabetic kidney complication: Secondary | ICD-10-CM | POA: Diagnosis not present

## 2018-01-23 DIAGNOSIS — N186 End stage renal disease: Secondary | ICD-10-CM | POA: Diagnosis not present

## 2018-01-23 DIAGNOSIS — D631 Anemia in chronic kidney disease: Secondary | ICD-10-CM | POA: Diagnosis not present

## 2018-01-23 DIAGNOSIS — N2581 Secondary hyperparathyroidism of renal origin: Secondary | ICD-10-CM | POA: Diagnosis not present

## 2018-01-24 ENCOUNTER — Other Ambulatory Visit: Payer: Self-pay | Admitting: Nurse Practitioner

## 2018-01-25 ENCOUNTER — Other Ambulatory Visit: Payer: Self-pay | Admitting: Internal Medicine

## 2018-01-26 DIAGNOSIS — N2581 Secondary hyperparathyroidism of renal origin: Secondary | ICD-10-CM | POA: Diagnosis not present

## 2018-01-26 DIAGNOSIS — E1129 Type 2 diabetes mellitus with other diabetic kidney complication: Secondary | ICD-10-CM | POA: Diagnosis not present

## 2018-01-26 DIAGNOSIS — N186 End stage renal disease: Secondary | ICD-10-CM | POA: Diagnosis not present

## 2018-01-26 DIAGNOSIS — D631 Anemia in chronic kidney disease: Secondary | ICD-10-CM | POA: Diagnosis not present

## 2018-01-27 ENCOUNTER — Ambulatory Visit: Payer: Medicare Other | Attending: Family Medicine | Admitting: Family Medicine

## 2018-01-27 ENCOUNTER — Encounter: Payer: Self-pay | Admitting: Family Medicine

## 2018-01-27 VITALS — BP 122/70 | HR 72 | Temp 98.0°F | Ht 70.0 in | Wt 224.2 lb

## 2018-01-27 DIAGNOSIS — Z992 Dependence on renal dialysis: Secondary | ICD-10-CM | POA: Diagnosis not present

## 2018-01-27 DIAGNOSIS — E785 Hyperlipidemia, unspecified: Secondary | ICD-10-CM | POA: Insufficient documentation

## 2018-01-27 DIAGNOSIS — Z7902 Long term (current) use of antithrombotics/antiplatelets: Secondary | ICD-10-CM | POA: Insufficient documentation

## 2018-01-27 DIAGNOSIS — R109 Unspecified abdominal pain: Secondary | ICD-10-CM

## 2018-01-27 DIAGNOSIS — Z9861 Coronary angioplasty status: Secondary | ICD-10-CM

## 2018-01-27 DIAGNOSIS — R42 Dizziness and giddiness: Secondary | ICD-10-CM | POA: Diagnosis not present

## 2018-01-27 DIAGNOSIS — E039 Hypothyroidism, unspecified: Secondary | ICD-10-CM | POA: Diagnosis not present

## 2018-01-27 DIAGNOSIS — Z7982 Long term (current) use of aspirin: Secondary | ICD-10-CM | POA: Diagnosis not present

## 2018-01-27 DIAGNOSIS — J45909 Unspecified asthma, uncomplicated: Secondary | ICD-10-CM | POA: Diagnosis not present

## 2018-01-27 DIAGNOSIS — I739 Peripheral vascular disease, unspecified: Secondary | ICD-10-CM

## 2018-01-27 DIAGNOSIS — Z955 Presence of coronary angioplasty implant and graft: Secondary | ICD-10-CM | POA: Diagnosis not present

## 2018-01-27 DIAGNOSIS — E1122 Type 2 diabetes mellitus with diabetic chronic kidney disease: Secondary | ICD-10-CM

## 2018-01-27 DIAGNOSIS — I252 Old myocardial infarction: Secondary | ICD-10-CM | POA: Insufficient documentation

## 2018-01-27 DIAGNOSIS — E1140 Type 2 diabetes mellitus with diabetic neuropathy, unspecified: Secondary | ICD-10-CM | POA: Diagnosis not present

## 2018-01-27 DIAGNOSIS — Z6839 Body mass index (BMI) 39.0-39.9, adult: Secondary | ICD-10-CM | POA: Diagnosis not present

## 2018-01-27 DIAGNOSIS — K219 Gastro-esophageal reflux disease without esophagitis: Secondary | ICD-10-CM | POA: Insufficient documentation

## 2018-01-27 DIAGNOSIS — I10 Essential (primary) hypertension: Secondary | ICD-10-CM

## 2018-01-27 DIAGNOSIS — N186 End stage renal disease: Secondary | ICD-10-CM

## 2018-01-27 DIAGNOSIS — I132 Hypertensive heart and chronic kidney disease with heart failure and with stage 5 chronic kidney disease, or end stage renal disease: Secondary | ICD-10-CM

## 2018-01-27 DIAGNOSIS — I2510 Atherosclerotic heart disease of native coronary artery without angina pectoris: Secondary | ICD-10-CM | POA: Insufficient documentation

## 2018-01-27 DIAGNOSIS — Z79899 Other long term (current) drug therapy: Secondary | ICD-10-CM | POA: Insufficient documentation

## 2018-01-27 DIAGNOSIS — M109 Gout, unspecified: Secondary | ICD-10-CM

## 2018-01-27 DIAGNOSIS — Z79890 Hormone replacement therapy (postmenopausal): Secondary | ICD-10-CM | POA: Diagnosis not present

## 2018-01-27 DIAGNOSIS — Z794 Long term (current) use of insulin: Secondary | ICD-10-CM | POA: Diagnosis not present

## 2018-01-27 DIAGNOSIS — M069 Rheumatoid arthritis, unspecified: Secondary | ICD-10-CM | POA: Insufficient documentation

## 2018-01-27 DIAGNOSIS — E669 Obesity, unspecified: Secondary | ICD-10-CM | POA: Diagnosis not present

## 2018-01-27 DIAGNOSIS — I5042 Chronic combined systolic (congestive) and diastolic (congestive) heart failure: Secondary | ICD-10-CM | POA: Diagnosis not present

## 2018-01-27 DIAGNOSIS — Z8673 Personal history of transient ischemic attack (TIA), and cerebral infarction without residual deficits: Secondary | ICD-10-CM | POA: Diagnosis not present

## 2018-01-27 DIAGNOSIS — F419 Anxiety disorder, unspecified: Secondary | ICD-10-CM | POA: Diagnosis not present

## 2018-01-27 DIAGNOSIS — Z7951 Long term (current) use of inhaled steroids: Secondary | ICD-10-CM | POA: Insufficient documentation

## 2018-01-27 DIAGNOSIS — E119 Type 2 diabetes mellitus without complications: Secondary | ICD-10-CM | POA: Diagnosis present

## 2018-01-27 LAB — POCT GLYCOSYLATED HEMOGLOBIN (HGB A1C): HBA1C, POC (CONTROLLED DIABETIC RANGE): 5.2 % (ref 0.0–7.0)

## 2018-01-27 LAB — GLUCOSE, POCT (MANUAL RESULT ENTRY): POC Glucose: 118 mg/dl — AB (ref 70–99)

## 2018-01-27 MED ORDER — GLUCOSE BLOOD VI STRP: ORAL_STRIP | 12 refills | Status: AC

## 2018-01-27 MED ORDER — METOCLOPRAMIDE HCL 5 MG PO TABS: 5.0000 mg | ORAL_TABLET | Freq: Three times a day (TID) | ORAL | 3 refills | Status: AC | PRN

## 2018-01-27 MED ORDER — BUSPIRONE HCL 10 MG PO TABS: 10.0000 mg | ORAL_TABLET | Freq: Three times a day (TID) | ORAL | 6 refills | Status: DC

## 2018-01-27 MED ORDER — ACCU-CHEK SOFT TOUCH LANCETS MISC: 12 refills | Status: AC

## 2018-01-27 MED ORDER — ALLOPURINOL 300 MG PO TABS: 300.0000 mg | ORAL_TABLET | Freq: Every day | ORAL | 1 refills | Status: DC

## 2018-01-27 MED ORDER — ACCU-CHEK AVIVA CONNECT W/DEVICE KIT: 16.0000 [IU] | PACK | Freq: Every day | 0 refills | Status: AC

## 2018-01-27 MED ORDER — CLOPIDOGREL BISULFATE 75 MG PO TABS: 75.0000 mg | ORAL_TABLET | Freq: Every day | ORAL | 1 refills | Status: DC

## 2018-01-27 MED ORDER — ISOSORBIDE MONONITRATE ER 60 MG PO TB24: 60.0000 mg | ORAL_TABLET | Freq: Every day | ORAL | 1 refills | Status: DC

## 2018-01-27 NOTE — Patient Instructions (Signed)

## 2018-01-27 NOTE — Progress Notes (Signed)
Subjective:  Patient ID: Brooke Stafford, female    DOB: 1955-05-20  Age: 62 y.o. MRN: [id number]  CC: Diabetes and Abdominal Pain   HPI Brooke Stafford is a 62 year old female with a history of type 2 diabetes mellitus (A1c 5.2), hypertension, CHF (2-D echo 35 to 40% from 2-D echo of 04/2016), CAD status post PCI ,End-stage renal disease (currently on hemodialysis on Mondays, Wednesdays and Fridays), peripheral vascular disease (status post left SFA stent placement in 2012 and 2014 and right SFA and popliteal artery angioplasty and stent placement), rheumatoid arthritis here for a follow-up visit. She currently takes Plaquenil for her rheumatoid arthritis and receives tramadol from her rheumatologist for intermittent joint pains.  She complains of chronic abdominal pain which is like a cramping sensation with associated bloating.  She denies being constipated and does have diarrhea about once a week.  Denies nausea, vomiting and recently underwent a colonoscopy which was negative for malignancy.  She denies reflux symptoms.  She has also had a lot of gas in her abdomen and sometimes has a early satiety Also complains of dizziness which has occurred on change of position in a few occasions.  She has noted that she her blood pressure has been as low as 80/60 at hemodialysis. Diabetes is managed by endocrinology and she denies hypoglycemic episodes; last office visit was on 01/13/2018. Also seen by GYN for postmenopausal bleed and pathology of endometrial biopsy was normal. Denies shortness of breath, pedal edema.  Past Medical History:  Diagnosis Date  . Anemia   . Anginal pain ([place])   . Anxiety   . Arthritis    "stiff fingers and knees" (08/04/2013), (12/12/2014)  . Asthma   . CAD (coronary artery disease) 2002; 2015   CABG x 6 2002, cath 2011- med Rx stent DES VG-Diag  . CAD (coronary artery disease) of artery bypass graft; DES to VG-Diag 09/28/13 11/09/2013  . Cataract   . CHF (congestive  heart failure) ([place])    "in 2002" (11/26/2012)  . Chronic bronchitis ([place])    "q year; in the winter"   . CKD (chronic kidney disease)    stage 4, followed by [facility]  . Coronary artery disease 2002   CABG x 6. Cath 5/11- med Rx  . Diabetic neuropathy ([place])   . GERD (gastroesophageal reflux disease)   . Gout    "right big toe"  . Headache    "~ q week" (08/04/2013); "~ twice/month" (12/12/2014)  . History of blood transfusion 2002   "when I had OHS"  . Hyperlipidemia   . Hypertension   . Hypothyroid    treated  . Migraines    "couple times/year" (08/04/2013), (12/12/2014)  . Myocardial infarction ([place]) 2000; 2002; 2011  . Obesity (BMI 35.0-39.9 without comorbidity)   . Peripheral vascular disease ([place]) 12/12   LSFA PTA  . Pneumonia    "3 times I think" (12/12/2014)  . Stroke [facility])    " mini stroke"  . Type II diabetes mellitus ([place])     Past Surgical History:  Procedure Laterality Date  . ABDOMINAL AORTAGRAM N/A 04/05/2011   Procedure: ABDOMINAL AORTAGRAM;  Surgeon: Lorretta Harp, MD;  Location[facility] CATH LAB;  Service: Cardiovascular;  Laterality: N/A;  . ABDOMINAL AORTOGRAM N/A 10/30/2016   Procedure: Abdominal Aortogram;  Surgeon: Wellington Hampshire, MD;  Location: [place] CV LAB;  Service: Cardiovascular;  Laterality: N/A;  . APPENDECTOMY  1980  . AV FISTULA PLACEMENT Left 05/10/2016  Procedure: LEFT ARM ARTERIOVENOUS (AV) FISTULA CREATION;  Surgeon: Rosetta Posner, MD;  Location: [place];  Service: Vascular;  Laterality: Left;  . BASCILIC VEIN TRANSPOSITION Left 06/26/2016   Procedure: SECOND STAGE BASILIC VEIN TRANSPOSITION;  Surgeon: Rosetta Posner, MD;  Location: [place];  Service: Vascular;  Laterality: Left;  . BREAST CYST EXCISION Right 1970's  . CARDIAC CATHETERIZATION  2002  . CARDIAC CATHETERIZATION N/A 12/12/2014   Procedure: Left Heart Cath and Cors/Grafts Angiography;  Surgeon: Peter M Martinique, MD;  Location: [place] CV LAB;  Service: Cardiovascular;   Laterality: N/A;  . CARDIAC CATHETERIZATION N/A 12/12/2014   Procedure: Coronary Stent Intervention;  Surgeon: Peter M Martinique, MD;  Location: [place] CV LAB;  Service: Cardiovascular;  Laterality: N/A;  . CATARACT EXTRACTION, BILATERAL    . CESAREAN SECTION  1978; 1980  . CHOLECYSTECTOMY  1982  . COLONOSCOPY WITH PROPOFOL N/A 11/03/2017   Procedure: COLONOSCOPY WITH PROPOFOL;  Surgeon: Doran Stabler, MD;  Location: [facility] ENDOSCOPY;  Service: Gastroenterology;  Laterality: N/A;  . CORONARY ANGIOPLASTY WITH STENT PLACEMENT  2004; 2012   "I have 2 stents" (08/04/2013)  . CORONARY ANGIOPLASTY WITH STENT PLACEMENT  09/28/13   PTCA/ DES Xience stent to VG-Diag   . CORONARY ARTERY BYPASS GRAFT  11/20/2000   x6 LIMA to distal LAD, svg to first diag, svg to ramus intermediate branch and swquential SVG to cir marginal branch, SVG to posterior descending coronary and sequential SVG to first right posterolateral branch  . eye injections    . INSERTION OF DIALYSIS CATHETER Right 05/10/2016   Procedure: INSERTION OF DIALYSIS CATHETER - Right Internal Jugular Placement;  Surgeon: Rosetta Posner, MD;  Location: [place];  Service: Vascular;  Laterality: Right;  . LEFT HEART CATHETERIZATION WITH CORONARY/GRAFT ANGIOGRAM N/A 09/28/2013   Procedure: LEFT HEART CATHETERIZATION WITH Beatrix Fetters;  Surgeon: Peter M Martinique, MD;  Location: [facility] CATH LAB;  Service: Cardiovascular;  Laterality: N/A;  . LOWER EXTREMITY ANGIOGRAM  12/01/2012   Procedure: LOWER EXTREMITY ANGIOGRAM;  Surgeon: Lorretta Harp, MD;  Location: [facility] CATH LAB;  Service: Cardiovascular;;  . LOWER EXTREMITY ANGIOGRAPHY Right 10/30/2016   Procedure: Lower Extremity Angiography;  Surgeon: Wellington Hampshire, MD;  Location: [place] CV LAB;  Service: Cardiovascular;  Laterality: Right;  . NM MYOCAR PERF WALL MOTION  08/27/2004   negative  . PERCUTANEOUS STENT INTERVENTION Left 12/01/2012   Procedure: PERCUTANEOUS STENT INTERVENTION;  Surgeon:  Lorretta Harp, MD;  Location: [facility] CATH LAB;  Service: Cardiovascular;  Laterality: Left;  Left SFA  . PERIPHERAL ARTERIAL STENT GRAFT Left    SFA/notes 04/07/2011 (11/30/2012)  . PERIPHERAL VASCULAR ATHERECTOMY Right 10/30/2016   Procedure: Peripheral Vascular Atherectomy;  Surgeon: Wellington Hampshire, MD;  Location: [place] CV LAB;  Service: Cardiovascular;  Laterality: Right;  cancel unable to  . PERIPHERAL VASCULAR INTERVENTION Right 10/30/2016   Procedure: Peripheral Vascular Intervention;  Surgeon: Wellington Hampshire, MD;  Location: [place] CV LAB;  Service: Cardiovascular;  Laterality: Right;  SFA  . POLYPECTOMY  11/03/2017   Procedure: POLYPECTOMY;  Surgeon: Doran Stabler, MD;  Location: [facility];  Service: Gastroenterology;;  . RENAL ANGIOGRAM N/A 04/05/2011   Procedure: RENAL ANGIOGRAM;  Surgeon: Lorretta Harp, MD;  Location: [facility] CATH LAB;  Service: Cardiovascular;  Laterality: N/A;  . TUBAL LIGATION  1980    Allergies  Allergen Reactions  . Digoxin And Related Diarrhea and Other (See Comments)    TOXIC  DRUG LEVELS Patient stated she almost died. Had flu like symptoms as well as diarrhea.  . Hydralazine Shortness Of Breath  . Penicillins Cross Reactors Hives and Other (See Comments)    HIGH FEVER  Has patient had a PCN reaction causing immediate rash, facial/tongue/throat swelling, SOB or lightheadedness with hypotension: No Has patient had a PCN reaction causing SEVERE RASH INVOLVING MUCUS MEMBRANES or SKIN NECROSIS  #  #  #  YES  #  #  #  Has patient had a PCN reaction that required hospitalization: Unk Has patient had a PCN reaction occurring within the last 10 years: Unk If all of the above answers are "NO", then may proceed with Cephalosporin use.   [name] Lisinopril Other (See Comments)    Felt like she had the flu. Was very sick!!!  . Adhesive [Tape] Rash and Other (See Comments)    bruising     Outpatient Medications Prior to Visit  Medication Sig  Dispense Refill  . aspirin 81 MG chewable tablet Chew 1 tablet (81 mg total) by mouth daily. 30 tablet 10  . atorvastatin (LIPITOR) 40 MG tablet Take 1 tablet (40 mg total) by mouth every evening. 90 tablet 3  . AURYXIA 1 GM 210 MG(Fe) tablet TAKE 2 TABLETS BY MOUTH THREE TIMES A DAY WITH MEALS.   SWALLOW WHOLE, DO NOT CHEW OR CRUSH MEDICATION  1  . cetirizine (ZYRTEC) 10 MG tablet Take 10 mg by mouth daily.    . fluticasone (FLONASE) 50 MCG/ACT nasal spray Place 1 spray into both nostrils daily. (Patient taking differently: Place 2 sprays into both nostrils daily. ) 48 g 2  . gabapentin (NEURONTIN) 400 MG capsule Take 1 capsule (400 mg total) by mouth at bedtime. 30 capsule 6  . hydroxychloroquine (PLAQUENIL) 200 MG tablet TAKE 2 TABLETS BY MOUTH EVERY DAY WITH FOOD OR MILK  0  . hydrOXYzine (ATARAX/VISTARIL) 25 MG tablet Take 0.5 tablets (12.5 mg total) by mouth daily as needed. (Patient taking differently: Take 12.5 mg by mouth daily as needed for anxiety. ) 30 tablet 0  . insulin aspart (NOVOLOG FLEXPEN) 100 UNIT/ML FlexPen Inject 5-8 Units into the skin 3 (three) times daily with meals. 15 mL 11  . Insulin Glargine (BASAGLAR KWIKPEN) 100 UNIT/ML SOPN INJECT 16 UNITS INTO THE SKIN AT BEDTIME 45 mL 3  . Insulin Pen Needle 32G X 4 MM MISC Use 4x a day 200 each 3  . ipratropium (ATROVENT) 0.03 % nasal spray Place 2 sprays into both nostrils every 12 (twelve) hours. (Patient taking differently: Place 2 sprays into both nostrils 2 (two) times daily as needed for rhinitis. ) 30 mL 6  . levothyroxine (SYNTHROID, LEVOTHROID) 25 MCG tablet take 1 tablet by mouth every morning before BREAKFAST (Patient taking differently: Take 25 mcg by mouth daily before breakfast. ) 30 tablet 6  . lidocaine-prilocaine (EMLA) cream Apply 1 application topically as needed (for port access).   4  . LINZESS 145 MCG CAPS capsule TAKE 1 CAPSULE(145 MCG) BY MOUTH DAILY BEFORE BREAKFAST 30 capsule 0  . metoprolol tartrate  (LOPRESSOR) 25 MG tablet TAKE 1/2 TABLET(12.5 MG) BY MOUTH TWICE DAILY 90 tablet 0  . Na Sulfate-K Sulfate-Mg Sulf 17.5-3.13-1.6 GM/177ML SOLN Suprep-Use as directed 354 mL 0  . nitroGLYCERIN (NITROSTAT) 0.4 MG SL tablet Place 1 tablet (0.4 mg total) under the tongue every 5 (five) minutes as needed. (Patient taking differently: Place 0.4 mg under the tongue every 5 (five) minutes as needed for chest  pain. ) 30 tablet 2  . NOVOLOG FLEXPEN 100 UNIT/ML FlexPen ADMINISTER 5 UNITS UNDER THE SKIN THREE TIMES DAILY WITH MEALS 15 mL 0  . ORENCIA CLICKJECT 676 MG/ML SOAJ     . RENVELA 800 MG tablet TAKE 3 TABLETS BY MOUTH THREE TIMES DAILY WITH MEALS  3  . sevelamer (RENAGEL) 800 MG tablet Take 2,400 mg by mouth 3 (three) times daily with meals.    . traMADol (ULTRAM) 50 MG tablet Take 1 tablet (50 mg total) by mouth every 12 (twelve) hours as needed. (Patient taking differently: Take 50 mg by mouth every 12 (twelve) hours as needed for moderate pain. ) 40 tablet 1  . allopurinol (ZYLOPRIM) 300 MG tablet TAKE 1 TABLET BY MOUTH ONCE DAILY 90 tablet 0  . busPIRone (BUSPAR) 10 MG tablet Take 1 tablet (10 mg total) by mouth 3 (three) times daily. 90 tablet 6  . clopidogrel (PLAVIX) 75 MG tablet Take 1 tablet (75 mg total) by mouth daily. 30 tablet 6  . isosorbide mononitrate (IMDUR) 60 MG 24 hr tablet Take 2 tablets (120 mg total) by mouth daily. 180 tablet 1   No facility-administered medications prior to visit.     ROS Review of Systems  Constitutional: Negative for activity change, appetite change and fatigue.  HENT: Negative for congestion, sinus pressure and sore throat.   Eyes: Negative for visual disturbance.  Respiratory: Negative for cough, chest tightness, shortness of breath and wheezing.   Cardiovascular: Negative for chest pain and palpitations.  Gastrointestinal: Positive for abdominal distention and abdominal pain. Negative for constipation.  Endocrine: Negative for polydipsia.    Genitourinary: Negative for dysuria and frequency.  Musculoskeletal: Negative for arthralgias and back pain.  Skin: Negative for rash.  Neurological: Positive for light-headedness. Negative for tremors and numbness.  Hematological: Does not bruise/bleed easily.  Psychiatric/Behavioral: Negative for agitation and behavioral problems.    Objective:  BP 122/70   Pulse 72   Temp 98 F (36.7 C) (Oral)   Ht 5\' 10"  (1.778 m)   Wt 224 lb 3.2 oz (101.7 kg)   LMP 10/13/2014 (Exact Date)   SpO2 97%   BMI 32.17 kg/m   BP/Weight 01/27/2018 01/13/2018 1/95/0932  Systolic BP 671 245 -  Diastolic BP 70 60 -  Wt. (Lbs) 224.2 223 220  BMI 32.17 32 31.57      Physical Exam  Constitutional: She is oriented to person, place, and time. She appears well-developed and well-nourished.  Cardiovascular: Normal rate and normal heart sounds.  No murmur heard. Unable to palpate dorsalis pedis bilaterally  Pulmonary/Chest: Effort normal and breath sounds normal. She has no wheezes. She has no rales. She exhibits no tenderness.  Abdominal: Soft. Bowel sounds are normal. She exhibits no distension and no mass. There is no tenderness.  Musculoskeletal: Normal range of motion.  Neurological: She is alert and oriented to person, place, and time.  Skin: Skin is warm and dry.  Psychiatric: She has a normal mood and affect.    Lipid Panel     Component Value Date/Time   CHOL 118 07/22/2017 1044   TRIG 160 (H) 07/22/2017 1044   HDL 43 07/22/2017 1044   CHOLHDL 2.7 07/22/2017 1044   CHOLHDL 3.6 07/25/2015 2037   VLDL 44 (H) 07/25/2015 2037   LDLCALC 43 07/22/2017 1044    Lab Results  Component Value Date   HGBA1C 5.2 01/27/2018    Assessment & Plan:   1. Type 2 diabetes mellitus with ESRD (end-stage  renal disease) ([place]) Controlled with A1c of 5.2 Educated about management of hypoglycemia She is managed by endocrine and I have advised her to discuss possibility reducing her dose of Lantus to  prevent hypoglycemia as she is not a high risk of this given renal disease Continue with diabetic diet - POCT glucose (manual entry) - POCT glycosylated hemoglobin (Hb A1C)  2. Essential hypertension, benign She is in the hypotensive range and this could explain dizziness Reduce isosorbide from 120 mg to 60 mg daily - isosorbide mononitrate (IMDUR) 60 MG 24 hr tablet; Take 1 tablet (60 mg total) by mouth daily.  Dispense: 90 tablet; Refill: 1  3. Chronic combined systolic and diastolic congestive heart failure (HCC) Euvolemic EF 35 to 40% from echo 04/2016 - isosorbide mononitrate (IMDUR) 60 MG 24 hr tablet; Take 1 tablet (60 mg total) by mouth daily.  Dispense: 90 tablet; Refill: 1  4. PVD, LSFA PTA 12/12 Risk factor modification - clopidogrel (PLAVIX) 75 MG tablet; Take 1 tablet (75 mg total) by mouth daily.  Dispense: 90 tablet; Refill: 1  5. Gout of big toe No recent exacerbation - allopurinol (ZYLOPRIM) 300 MG tablet; Take 1 tablet (300 mg total) by mouth daily.  Dispense: 90 tablet; Refill: 1  6. Anxiety Controlled - busPIRone (BUSPAR) 10 MG tablet; Take 1 tablet (10 mg total) by mouth 3 (three) times daily.  Dispense: 90 tablet; Refill: 6  7. Abdominal pain, unspecified abdominal location Unknown etiology Colonoscopy unrevealing Advised to obtain simethicone capsules and I have placed her on Reglan in the event that this could be diabetic gastroparesis manifesting in this form Advised on dietary modifications and also to keep a symptom diary - DG Abd 2 Views; Future  8. Dizziness Could be secondary to hypotension - TSH  9. Hypothyroidism, acquired Send of TSH  10. Hypertensive heart and renal disease with CHF and ESRD ([place]) Continue hemodialysis as per schedule   Meds ordered this encounter  Medications  . isosorbide mononitrate (IMDUR) 60 MG 24 hr tablet    Sig: Take 1 tablet (60 mg total) by mouth daily.    Dispense:  90 tablet    Refill:  1    Discontinue  previous dose  . metoCLOPramide (REGLAN) 5 MG tablet    Sig: Take 1 tablet (5 mg total) by mouth every 8 (eight) hours as needed for nausea.    Dispense:  90 tablet    Refill:  3  . clopidogrel (PLAVIX) 75 MG tablet    Sig: Take 1 tablet (75 mg total) by mouth daily.    Dispense:  90 tablet    Refill:  1  . allopurinol (ZYLOPRIM) 300 MG tablet    Sig: Take 1 tablet (300 mg total) by mouth daily.    Dispense:  90 tablet    Refill:  1  . busPIRone (BUSPAR) 10 MG tablet    Sig: Take 1 tablet (10 mg total) by mouth 3 (three) times daily.    Dispense:  90 tablet    Refill:  6    Discontinue Previous dose    Follow-up: Return in about 3 months (around 04/29/2018) for Follow-up of chronic medical conditions.   Charlott Rakes MD

## 2018-01-27 NOTE — Progress Notes (Signed)
Patient has been feeling dizzy.

## 2018-01-28 DIAGNOSIS — N186 End stage renal disease: Secondary | ICD-10-CM | POA: Diagnosis not present

## 2018-01-28 DIAGNOSIS — E1129 Type 2 diabetes mellitus with other diabetic kidney complication: Secondary | ICD-10-CM | POA: Diagnosis not present

## 2018-01-28 DIAGNOSIS — N2581 Secondary hyperparathyroidism of renal origin: Secondary | ICD-10-CM | POA: Diagnosis not present

## 2018-01-28 DIAGNOSIS — D631 Anemia in chronic kidney disease: Secondary | ICD-10-CM | POA: Diagnosis not present

## 2018-01-28 LAB — TSH: TSH: 2.5 u[IU]/mL (ref 0.450–4.500)

## 2018-01-29 ENCOUNTER — Telehealth: Payer: Self-pay

## 2018-01-29 NOTE — Telephone Encounter (Signed)
Fax received from [facility] for medical records request.  Fax forwarded to medical record department.

## 2018-01-30 DIAGNOSIS — N2581 Secondary hyperparathyroidism of renal origin: Secondary | ICD-10-CM | POA: Diagnosis not present

## 2018-01-30 DIAGNOSIS — D631 Anemia in chronic kidney disease: Secondary | ICD-10-CM | POA: Diagnosis not present

## 2018-01-30 DIAGNOSIS — E1129 Type 2 diabetes mellitus with other diabetic kidney complication: Secondary | ICD-10-CM | POA: Diagnosis not present

## 2018-01-30 DIAGNOSIS — N186 End stage renal disease: Secondary | ICD-10-CM | POA: Diagnosis not present

## 2018-02-02 DIAGNOSIS — E1129 Type 2 diabetes mellitus with other diabetic kidney complication: Secondary | ICD-10-CM | POA: Diagnosis not present

## 2018-02-02 DIAGNOSIS — N186 End stage renal disease: Secondary | ICD-10-CM | POA: Diagnosis not present

## 2018-02-02 DIAGNOSIS — N2581 Secondary hyperparathyroidism of renal origin: Secondary | ICD-10-CM | POA: Diagnosis not present

## 2018-02-02 DIAGNOSIS — D631 Anemia in chronic kidney disease: Secondary | ICD-10-CM | POA: Diagnosis not present

## 2018-02-04 DIAGNOSIS — N186 End stage renal disease: Secondary | ICD-10-CM | POA: Diagnosis not present

## 2018-02-04 DIAGNOSIS — E1129 Type 2 diabetes mellitus with other diabetic kidney complication: Secondary | ICD-10-CM | POA: Diagnosis not present

## 2018-02-04 DIAGNOSIS — D631 Anemia in chronic kidney disease: Secondary | ICD-10-CM | POA: Diagnosis not present

## 2018-02-04 DIAGNOSIS — N2581 Secondary hyperparathyroidism of renal origin: Secondary | ICD-10-CM | POA: Diagnosis not present

## 2018-02-05 ENCOUNTER — Ambulatory Visit: Payer: Medicare Other | Admitting: Orthotics

## 2018-02-05 DIAGNOSIS — M21619 Bunion of unspecified foot: Secondary | ICD-10-CM

## 2018-02-05 DIAGNOSIS — M2040 Other hammer toe(s) (acquired), unspecified foot: Secondary | ICD-10-CM

## 2018-02-05 DIAGNOSIS — E11621 Type 2 diabetes mellitus with foot ulcer: Secondary | ICD-10-CM

## 2018-02-05 DIAGNOSIS — I739 Peripheral vascular disease, unspecified: Secondary | ICD-10-CM

## 2018-02-05 DIAGNOSIS — E1142 Type 2 diabetes mellitus with diabetic polyneuropathy: Secondary | ICD-10-CM

## 2018-02-05 DIAGNOSIS — L97519 Non-pressure chronic ulcer of other part of right foot with unspecified severity: Secondary | ICD-10-CM

## 2018-02-05 NOTE — Progress Notes (Signed)

## 2018-02-06 DIAGNOSIS — N2581 Secondary hyperparathyroidism of renal origin: Secondary | ICD-10-CM | POA: Diagnosis not present

## 2018-02-06 DIAGNOSIS — N186 End stage renal disease: Secondary | ICD-10-CM | POA: Diagnosis not present

## 2018-02-06 DIAGNOSIS — D631 Anemia in chronic kidney disease: Secondary | ICD-10-CM | POA: Diagnosis not present

## 2018-02-06 DIAGNOSIS — E1129 Type 2 diabetes mellitus with other diabetic kidney complication: Secondary | ICD-10-CM | POA: Diagnosis not present

## 2018-02-09 DIAGNOSIS — N2581 Secondary hyperparathyroidism of renal origin: Secondary | ICD-10-CM | POA: Diagnosis not present

## 2018-02-09 DIAGNOSIS — N186 End stage renal disease: Secondary | ICD-10-CM | POA: Diagnosis not present

## 2018-02-09 DIAGNOSIS — E1129 Type 2 diabetes mellitus with other diabetic kidney complication: Secondary | ICD-10-CM | POA: Diagnosis not present

## 2018-02-09 DIAGNOSIS — D631 Anemia in chronic kidney disease: Secondary | ICD-10-CM | POA: Diagnosis not present

## 2018-02-10 DIAGNOSIS — Z992 Dependence on renal dialysis: Secondary | ICD-10-CM | POA: Diagnosis not present

## 2018-02-10 DIAGNOSIS — T82858A Stenosis of vascular prosthetic devices, implants and grafts, initial encounter: Secondary | ICD-10-CM | POA: Diagnosis not present

## 2018-02-10 DIAGNOSIS — I871 Compression of vein: Secondary | ICD-10-CM | POA: Diagnosis not present

## 2018-02-10 DIAGNOSIS — N186 End stage renal disease: Secondary | ICD-10-CM | POA: Diagnosis not present

## 2018-02-11 DIAGNOSIS — E1129 Type 2 diabetes mellitus with other diabetic kidney complication: Secondary | ICD-10-CM | POA: Diagnosis not present

## 2018-02-11 DIAGNOSIS — N2581 Secondary hyperparathyroidism of renal origin: Secondary | ICD-10-CM | POA: Diagnosis not present

## 2018-02-11 DIAGNOSIS — D631 Anemia in chronic kidney disease: Secondary | ICD-10-CM | POA: Diagnosis not present

## 2018-02-11 DIAGNOSIS — N186 End stage renal disease: Secondary | ICD-10-CM | POA: Diagnosis not present

## 2018-02-13 DIAGNOSIS — N2581 Secondary hyperparathyroidism of renal origin: Secondary | ICD-10-CM | POA: Diagnosis not present

## 2018-02-13 DIAGNOSIS — D631 Anemia in chronic kidney disease: Secondary | ICD-10-CM | POA: Diagnosis not present

## 2018-02-13 DIAGNOSIS — Z992 Dependence on renal dialysis: Secondary | ICD-10-CM | POA: Diagnosis not present

## 2018-02-13 DIAGNOSIS — N186 End stage renal disease: Secondary | ICD-10-CM | POA: Diagnosis not present

## 2018-02-13 DIAGNOSIS — I129 Hypertensive chronic kidney disease with stage 1 through stage 4 chronic kidney disease, or unspecified chronic kidney disease: Secondary | ICD-10-CM | POA: Diagnosis not present

## 2018-02-13 DIAGNOSIS — E1129 Type 2 diabetes mellitus with other diabetic kidney complication: Secondary | ICD-10-CM | POA: Diagnosis not present

## 2018-02-16 DIAGNOSIS — D631 Anemia in chronic kidney disease: Secondary | ICD-10-CM | POA: Diagnosis not present

## 2018-02-16 DIAGNOSIS — E1129 Type 2 diabetes mellitus with other diabetic kidney complication: Secondary | ICD-10-CM | POA: Diagnosis not present

## 2018-02-16 DIAGNOSIS — N2581 Secondary hyperparathyroidism of renal origin: Secondary | ICD-10-CM | POA: Diagnosis not present

## 2018-02-16 DIAGNOSIS — N186 End stage renal disease: Secondary | ICD-10-CM | POA: Diagnosis not present

## 2018-02-17 ENCOUNTER — Ambulatory Visit: Payer: Self-pay | Admitting: Cardiovascular Disease

## 2018-02-18 DIAGNOSIS — N2581 Secondary hyperparathyroidism of renal origin: Secondary | ICD-10-CM | POA: Diagnosis not present

## 2018-02-18 DIAGNOSIS — E1129 Type 2 diabetes mellitus with other diabetic kidney complication: Secondary | ICD-10-CM | POA: Diagnosis not present

## 2018-02-18 DIAGNOSIS — D631 Anemia in chronic kidney disease: Secondary | ICD-10-CM | POA: Diagnosis not present

## 2018-02-18 DIAGNOSIS — N186 End stage renal disease: Secondary | ICD-10-CM | POA: Diagnosis not present

## 2018-02-19 ENCOUNTER — Encounter: Payer: Self-pay | Admitting: Cardiovascular Disease

## 2018-02-19 ENCOUNTER — Ambulatory Visit (INDEPENDENT_AMBULATORY_CARE_PROVIDER_SITE_OTHER): Payer: Medicare Other | Admitting: Cardiovascular Disease

## 2018-02-19 VITALS — BP 112/60 | HR 75 | Ht 70.5 in | Wt 222.6 lb

## 2018-02-19 DIAGNOSIS — I739 Peripheral vascular disease, unspecified: Secondary | ICD-10-CM | POA: Diagnosis not present

## 2018-02-19 DIAGNOSIS — I5042 Chronic combined systolic (congestive) and diastolic (congestive) heart failure: Secondary | ICD-10-CM

## 2018-02-19 DIAGNOSIS — E1122 Type 2 diabetes mellitus with diabetic chronic kidney disease: Secondary | ICD-10-CM | POA: Diagnosis not present

## 2018-02-19 DIAGNOSIS — N186 End stage renal disease: Secondary | ICD-10-CM | POA: Diagnosis not present

## 2018-02-19 DIAGNOSIS — E782 Mixed hyperlipidemia: Secondary | ICD-10-CM

## 2018-02-19 DIAGNOSIS — I2510 Atherosclerotic heart disease of native coronary artery without angina pectoris: Secondary | ICD-10-CM | POA: Diagnosis not present

## 2018-02-19 DIAGNOSIS — Z9861 Coronary angioplasty status: Secondary | ICD-10-CM

## 2018-02-19 MED ORDER — METOPROLOL TARTRATE 25 MG PO TABS: ORAL_TABLET | ORAL | 3 refills | Status: DC

## 2018-02-19 NOTE — Patient Instructions (Signed)
Medication Instructions:  Dr Sallyanne Kuster has recommended making the following medication changes: 1. CHANGE the way you take Metoprolol - take 12.5 mg (0.5 tablet) on Monday, Wednesday, Friday, and Saturday evenings  If you need a refill on your cardiac medications before your next appointment, please call your pharmacy.   Follow-Up: At [facility], you and your health needs are our priority.  As part of our continuing mission to provide you with exceptional heart care, we have created designated Provider Care Teams.  These Care Teams include your primary Cardiologist (physician) and Advanced Practice Providers (APPs -  Physician Assistants and Nurse Practitioners) who all work together to provide you with the care you need, when you need it. You will need a follow up appointment in 12 months.  Please call our office 2 months in advance to schedule this appointment.  You may see Sanda Klein, MD or one of the following Advanced Practice Providers on your designated Care Team: [place], [place] . Fabian Sharp, PA-C
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
## 2018-02-20 DIAGNOSIS — E1129 Type 2 diabetes mellitus with other diabetic kidney complication: Secondary | ICD-10-CM | POA: Diagnosis not present

## 2018-02-20 DIAGNOSIS — N186 End stage renal disease: Secondary | ICD-10-CM | POA: Diagnosis not present

## 2018-02-20 DIAGNOSIS — D631 Anemia in chronic kidney disease: Secondary | ICD-10-CM | POA: Diagnosis not present

## 2018-02-20 DIAGNOSIS — N2581 Secondary hyperparathyroidism of renal origin: Secondary | ICD-10-CM | POA: Diagnosis not present

## 2018-02-21 ENCOUNTER — Encounter: Payer: Self-pay | Admitting: Cardiovascular Disease

## 2018-02-22 ENCOUNTER — Other Ambulatory Visit: Payer: Self-pay | Admitting: Family Medicine

## 2018-02-22 DIAGNOSIS — E039 Hypothyroidism, unspecified: Secondary | ICD-10-CM

## 2018-02-23 DIAGNOSIS — D631 Anemia in chronic kidney disease: Secondary | ICD-10-CM | POA: Diagnosis not present

## 2018-02-23 DIAGNOSIS — E1129 Type 2 diabetes mellitus with other diabetic kidney complication: Secondary | ICD-10-CM | POA: Diagnosis not present

## 2018-02-23 DIAGNOSIS — N186 End stage renal disease: Secondary | ICD-10-CM | POA: Diagnosis not present

## 2018-02-23 DIAGNOSIS — N2581 Secondary hyperparathyroidism of renal origin: Secondary | ICD-10-CM | POA: Diagnosis not present

## 2018-02-25 DIAGNOSIS — D631 Anemia in chronic kidney disease: Secondary | ICD-10-CM | POA: Diagnosis not present

## 2018-02-25 DIAGNOSIS — N2581 Secondary hyperparathyroidism of renal origin: Secondary | ICD-10-CM | POA: Diagnosis not present

## 2018-02-25 DIAGNOSIS — N186 End stage renal disease: Secondary | ICD-10-CM | POA: Diagnosis not present

## 2018-02-25 DIAGNOSIS — E1129 Type 2 diabetes mellitus with other diabetic kidney complication: Secondary | ICD-10-CM | POA: Diagnosis not present

## 2018-02-26 DIAGNOSIS — Z992 Dependence on renal dialysis: Secondary | ICD-10-CM | POA: Diagnosis not present

## 2018-02-26 DIAGNOSIS — N186 End stage renal disease: Secondary | ICD-10-CM | POA: Diagnosis not present

## 2018-02-26 DIAGNOSIS — I871 Compression of vein: Secondary | ICD-10-CM | POA: Diagnosis not present

## 2018-02-27 DIAGNOSIS — E1129 Type 2 diabetes mellitus with other diabetic kidney complication: Secondary | ICD-10-CM | POA: Diagnosis not present

## 2018-02-27 DIAGNOSIS — N186 End stage renal disease: Secondary | ICD-10-CM | POA: Diagnosis not present

## 2018-02-27 DIAGNOSIS — D631 Anemia in chronic kidney disease: Secondary | ICD-10-CM | POA: Diagnosis not present

## 2018-02-27 DIAGNOSIS — N2581 Secondary hyperparathyroidism of renal origin: Secondary | ICD-10-CM | POA: Diagnosis not present

## 2018-03-02 DIAGNOSIS — D631 Anemia in chronic kidney disease: Secondary | ICD-10-CM | POA: Diagnosis not present

## 2018-03-02 DIAGNOSIS — N2581 Secondary hyperparathyroidism of renal origin: Secondary | ICD-10-CM | POA: Diagnosis not present

## 2018-03-02 DIAGNOSIS — N186 End stage renal disease: Secondary | ICD-10-CM | POA: Diagnosis not present

## 2018-03-02 DIAGNOSIS — E1129 Type 2 diabetes mellitus with other diabetic kidney complication: Secondary | ICD-10-CM | POA: Diagnosis not present

## 2018-03-03 ENCOUNTER — Ambulatory Visit: Payer: Medicare Other | Admitting: Orthotics

## 2018-03-03 DIAGNOSIS — M2040 Other hammer toe(s) (acquired), unspecified foot: Secondary | ICD-10-CM

## 2018-03-03 DIAGNOSIS — E11621 Type 2 diabetes mellitus with foot ulcer: Secondary | ICD-10-CM

## 2018-03-03 DIAGNOSIS — M21619 Bunion of unspecified foot: Secondary | ICD-10-CM

## 2018-03-03 DIAGNOSIS — L97519 Non-pressure chronic ulcer of other part of right foot with unspecified severity: Secondary | ICD-10-CM

## 2018-03-03 DIAGNOSIS — E1142 Type 2 diabetes mellitus with diabetic polyneuropathy: Secondary | ICD-10-CM

## 2018-03-04 DIAGNOSIS — E1129 Type 2 diabetes mellitus with other diabetic kidney complication: Secondary | ICD-10-CM | POA: Diagnosis not present

## 2018-03-04 DIAGNOSIS — N186 End stage renal disease: Secondary | ICD-10-CM | POA: Diagnosis not present

## 2018-03-04 DIAGNOSIS — D631 Anemia in chronic kidney disease: Secondary | ICD-10-CM | POA: Diagnosis not present

## 2018-03-04 DIAGNOSIS — N2581 Secondary hyperparathyroidism of renal origin: Secondary | ICD-10-CM | POA: Diagnosis not present

## 2018-03-06 DIAGNOSIS — E1129 Type 2 diabetes mellitus with other diabetic kidney complication: Secondary | ICD-10-CM | POA: Diagnosis not present

## 2018-03-06 DIAGNOSIS — N186 End stage renal disease: Secondary | ICD-10-CM | POA: Diagnosis not present

## 2018-03-06 DIAGNOSIS — D631 Anemia in chronic kidney disease: Secondary | ICD-10-CM | POA: Diagnosis not present

## 2018-03-06 DIAGNOSIS — N2581 Secondary hyperparathyroidism of renal origin: Secondary | ICD-10-CM | POA: Diagnosis not present

## 2018-03-08 DIAGNOSIS — N2581 Secondary hyperparathyroidism of renal origin: Secondary | ICD-10-CM | POA: Diagnosis not present

## 2018-03-08 DIAGNOSIS — E1129 Type 2 diabetes mellitus with other diabetic kidney complication: Secondary | ICD-10-CM | POA: Diagnosis not present

## 2018-03-08 DIAGNOSIS — D631 Anemia in chronic kidney disease: Secondary | ICD-10-CM | POA: Diagnosis not present

## 2018-03-08 DIAGNOSIS — N186 End stage renal disease: Secondary | ICD-10-CM | POA: Diagnosis not present

## 2018-03-10 DIAGNOSIS — N2581 Secondary hyperparathyroidism of renal origin: Secondary | ICD-10-CM | POA: Diagnosis not present

## 2018-03-10 DIAGNOSIS — N186 End stage renal disease: Secondary | ICD-10-CM | POA: Diagnosis not present

## 2018-03-10 DIAGNOSIS — E1129 Type 2 diabetes mellitus with other diabetic kidney complication: Secondary | ICD-10-CM | POA: Diagnosis not present

## 2018-03-10 DIAGNOSIS — D631 Anemia in chronic kidney disease: Secondary | ICD-10-CM | POA: Diagnosis not present

## 2018-03-13 DIAGNOSIS — D631 Anemia in chronic kidney disease: Secondary | ICD-10-CM | POA: Diagnosis not present

## 2018-03-13 DIAGNOSIS — E1129 Type 2 diabetes mellitus with other diabetic kidney complication: Secondary | ICD-10-CM | POA: Diagnosis not present

## 2018-03-13 DIAGNOSIS — N2581 Secondary hyperparathyroidism of renal origin: Secondary | ICD-10-CM | POA: Diagnosis not present

## 2018-03-13 DIAGNOSIS — N186 End stage renal disease: Secondary | ICD-10-CM | POA: Diagnosis not present

## 2018-03-15 DIAGNOSIS — N186 End stage renal disease: Secondary | ICD-10-CM | POA: Diagnosis not present

## 2018-03-15 DIAGNOSIS — I129 Hypertensive chronic kidney disease with stage 1 through stage 4 chronic kidney disease, or unspecified chronic kidney disease: Secondary | ICD-10-CM | POA: Diagnosis not present

## 2018-03-15 DIAGNOSIS — Z992 Dependence on renal dialysis: Secondary | ICD-10-CM | POA: Diagnosis not present

## 2018-03-16 DIAGNOSIS — N186 End stage renal disease: Secondary | ICD-10-CM | POA: Diagnosis not present

## 2018-03-16 DIAGNOSIS — D631 Anemia in chronic kidney disease: Secondary | ICD-10-CM | POA: Diagnosis not present

## 2018-03-16 DIAGNOSIS — N2581 Secondary hyperparathyroidism of renal origin: Secondary | ICD-10-CM | POA: Diagnosis not present

## 2018-03-16 DIAGNOSIS — E1129 Type 2 diabetes mellitus with other diabetic kidney complication: Secondary | ICD-10-CM | POA: Diagnosis not present

## 2018-03-17 ENCOUNTER — Ambulatory Visit (INDEPENDENT_AMBULATORY_CARE_PROVIDER_SITE_OTHER): Payer: Medicare Other | Admitting: Orthotics

## 2018-03-17 ENCOUNTER — Ambulatory Visit (INDEPENDENT_AMBULATORY_CARE_PROVIDER_SITE_OTHER): Payer: Medicare Other | Admitting: Sports Medicine

## 2018-03-17 ENCOUNTER — Encounter: Payer: Self-pay | Admitting: Sports Medicine

## 2018-03-17 DIAGNOSIS — M21611 Bunion of right foot: Secondary | ICD-10-CM

## 2018-03-17 DIAGNOSIS — E11621 Type 2 diabetes mellitus with foot ulcer: Secondary | ICD-10-CM | POA: Diagnosis not present

## 2018-03-17 DIAGNOSIS — M79674 Pain in right toe(s): Secondary | ICD-10-CM

## 2018-03-17 DIAGNOSIS — L97519 Non-pressure chronic ulcer of other part of right foot with unspecified severity: Secondary | ICD-10-CM

## 2018-03-17 DIAGNOSIS — E1142 Type 2 diabetes mellitus with diabetic polyneuropathy: Secondary | ICD-10-CM

## 2018-03-17 DIAGNOSIS — B351 Tinea unguium: Secondary | ICD-10-CM

## 2018-03-17 DIAGNOSIS — I739 Peripheral vascular disease, unspecified: Secondary | ICD-10-CM

## 2018-03-17 DIAGNOSIS — M21619 Bunion of unspecified foot: Secondary | ICD-10-CM

## 2018-03-17 DIAGNOSIS — M79675 Pain in left toe(s): Secondary | ICD-10-CM | POA: Diagnosis not present

## 2018-03-17 DIAGNOSIS — M21612 Bunion of left foot: Secondary | ICD-10-CM | POA: Diagnosis not present

## 2018-03-17 NOTE — Progress Notes (Signed)

## 2018-03-17 NOTE — Progress Notes (Signed)
Subjective: Brooke Stafford is a 62 y.o. female patient seen in office for follow up nail care and reports that she picked up her diabetic shoes today.  Patient has no other pedal complaints at this time.  FBS this AM not recorded.  Patient is also a dialysis patient and is on Plavix, follows with vascular as previously noted.  Denies any new changes to health since last visit.  Patient Active Problem List   Diagnosis Date Noted  . Heme positive stool   . Benign neoplasm of ascending colon   . Benign neoplasm of transverse colon   . Benign neoplasm of descending colon   . Hypercholesterolemia 06/18/2017  . Coronary artery disease of autologous vein bypass graft with stable angina pectoris ([place]) 06/18/2017  . Diabetic neuropathy ([place]) 04/17/2017  . GERD (gastroesophageal reflux disease) 04/17/2017  . Foot ulcer, right ([place]) 11/28/2016  . Cervical polyp 11/12/2016  . AV (arteriovenous fistula) ([place]) 06/17/2016  . Mixed hyperlipidemia 05/21/2016  . ESRD (end stage renal disease) ([place])   . Hypokalemia 05/03/2016  . COPD exacerbation ([place]) 05/03/2016  . COPD (chronic obstructive pulmonary disease) ([place]) 04/11/2016  . CAP (community acquired pneumonia) 03/26/2016  . Acute respiratory failure with hypoxia ([place]) 03/26/2016  . Acute on chronic combined systolic and diastolic CHF (congestive heart failure) ([place]) 03/26/2016  . Medication management 03/21/2016  . Anemia of chronic disease 12/27/2015  . Acute on chronic systolic CHF (congestive heart failure) ([place])   . Acute CHF ([place]) 12/18/2015  . Hypertensive heart and renal disease with CHF and ESRD ([place]) 12/18/2015  . Constipation 12/18/2015  . Anxiety 11/02/2015  . Posterior circulation stroke ([place]) 11/02/2015  . Abnormality of gait 10/23/2015  . Depression 09/28/2015  . Diabetic retinopathy ([place]) 09/13/2015  . Grief reaction 08/24/2015  . Creatinine elevation 07/25/2015  . Chronic combined systolic and diastolic congestive heart  failure ([place]) 07/25/2015  . Rash of hands 02/23/2015  . Essential hypertension   . Angina pectoris ([place]) 12/12/2014  . Abnormal nuclear stress test   . Acute combined systolic and diastolic heart failure ([place]) 12/02/2014  . Seasonal allergies 08/11/2014  . Gout 08/11/2014  . Encounter for screening mammogram for breast cancer 05/16/2014  . Screening for colon cancer 05/16/2014  . Type 2 diabetes mellitus with ESRD (end-stage renal disease) ([place]) 01/27/2014  . Atopic eczema 01/27/2014  . Precordial pain, atypical, negative MI, Musculature Skeletal pain  11/08/2013  . CKD (chronic kidney disease) stage 5, GFR less than 15 ml/min ([facility]) 11/08/2013  . Unstable angina ([place]) 09/28/2013  . Ischemic cardiomyopathy- new drop in EF 08/30/2013  . Chest pain 08/04/2013  . CAD -S/P PCI June 2015 and 12/14/14 02/01/2013  . Hypothyroidism, acquired 02/01/2013  . PVD, LSFA PTA 12/12 04/06/2011  . Hx of CABG x 6 2002 04/06/2011  . Dyslipidemia 04/06/2011   Current Outpatient Medications on File Prior to Visit  Medication Sig Dispense Refill  . allopurinol (ZYLOPRIM) 300 MG tablet Take 1 tablet (300 mg total) by mouth daily. 90 tablet 1  . aspirin 81 MG chewable tablet Chew 1 tablet (81 mg total) by mouth daily. 30 tablet 10  . atorvastatin (LIPITOR) 40 MG tablet Take 1 tablet (40 mg total) by mouth every evening. 90 tablet 3  . AURYXIA 1 GM 210 MG(Fe) tablet TAKE 2 TABLETS BY MOUTH THREE TIMES A DAY WITH MEALS.   SWALLOW WHOLE, DO NOT CHEW OR CRUSH MEDICATION  1  . Blood Glucose Monitoring Suppl (ACCU-CHEK AVIVA CONNECT) w/Device KIT  16 Units by Does not apply route daily. 1 kit 0  . busPIRone (BUSPAR) 10 MG tablet Take 1 tablet (10 mg total) by mouth 3 (three) times daily. 90 tablet 6  . cetirizine (ZYRTEC) 10 MG tablet Take 10 mg by mouth daily.    . clopidogrel (PLAVIX) 75 MG tablet Take 1 tablet (75 mg total) by mouth daily. 90 tablet 1  . fluticasone (FLONASE) 50 MCG/ACT nasal spray Place 1 spray  into both nostrils daily. (Patient taking differently: Place 2 sprays into both nostrils daily. ) 48 g 2  . gabapentin (NEURONTIN) 400 MG capsule Take 1 capsule (400 mg total) by mouth at bedtime. 30 capsule 6  . glucose blood (ACCU-CHEK ACTIVE STRIPS) test strip Use as instructed 100 each 12  . hydroxychloroquine (PLAQUENIL) 200 MG tablet TAKE 2 TABLETS BY MOUTH EVERY DAY WITH FOOD OR MILK  0  . hydrOXYzine (ATARAX/VISTARIL) 25 MG tablet Take 0.5 tablets (12.5 mg total) by mouth daily as needed. (Patient taking differently: Take 12.5 mg by mouth daily as needed for anxiety. ) 30 tablet 0  . insulin aspart (NOVOLOG FLEXPEN) 100 UNIT/ML FlexPen Inject 5-8 Units into the skin 3 (three) times daily with meals. 15 mL 11  . Insulin Glargine (BASAGLAR KWIKPEN) 100 UNIT/ML SOPN INJECT 16 UNITS INTO THE SKIN AT BEDTIME 45 mL 3  . Insulin Pen Needle 32G X 4 MM MISC Use 4x a day 200 each 3  . ipratropium (ATROVENT) 0.03 % nasal spray Place 2 sprays into both nostrils every 12 (twelve) hours. (Patient taking differently: Place 2 sprays into both nostrils 2 (two) times daily as needed for rhinitis. ) 30 mL 6  . isosorbide mononitrate (IMDUR) 60 MG 24 hr tablet Take 1 tablet (60 mg total) by mouth daily. 90 tablet 1  . Lancets (ACCU-CHEK SOFT TOUCH) lancets Use as instructed 100 each 12  . levothyroxine (SYNTHROID, LEVOTHROID) 25 MCG tablet TAKE 1 TABLET BY MOUTH EVERY MORNING BEFORE BREAKFAST 30 tablet 2  . lidocaine-prilocaine (EMLA) cream Apply 1 application topically as needed (for port access).   4  . LINZESS 145 MCG CAPS capsule TAKE 1 CAPSULE(145 MCG) BY MOUTH DAILY BEFORE BREAKFAST 30 capsule 0  . metoCLOPramide (REGLAN) 5 MG tablet Take 1 tablet (5 mg total) by mouth every 8 (eight) hours as needed for nausea. 90 tablet 3  . metoprolol tartrate (LOPRESSOR) 25 MG tablet Take 0.5 tablets (12.5 mg total) by mouth 4 (four) times a week on Monday, Wednesday, Friday, and Saturday evenings. 45 tablet 3  . Na  Sulfate-K Sulfate-Mg Sulf 17.5-3.13-1.6 GM/177ML SOLN Suprep-Use as directed 354 mL 0  . nitroGLYCERIN (NITROSTAT) 0.4 MG SL tablet Place 1 tablet (0.4 mg total) under the tongue every 5 (five) minutes as needed. (Patient taking differently: Place 0.4 mg under the tongue every 5 (five) minutes as needed for chest pain. ) 30 tablet 2  . NOVOLOG FLEXPEN 100 UNIT/ML FlexPen ADMINISTER 5 UNITS UNDER THE SKIN THREE TIMES DAILY WITH MEALS 15 mL 0  . ORENCIA CLICKJECT 824 MG/ML SOAJ     . RENVELA 800 MG tablet TAKE 3 TABLETS BY MOUTH THREE TIMES DAILY WITH MEALS  3  . sevelamer (RENAGEL) 800 MG tablet Take 2,400 mg by mouth 3 (three) times daily with meals.    . traMADol (ULTRAM) 50 MG tablet Take 1 tablet (50 mg total) by mouth every 12 (twelve) hours as needed. (Patient taking differently: Take 50 mg by mouth every 12 (twelve) hours as needed  for moderate pain. ) 40 tablet 1   No current facility-administered medications on file prior to visit.    Allergies  Allergen Reactions  . Digoxin And Related Diarrhea and Other (See Comments)    TOXIC DRUG LEVELS Patient stated she almost died. Had flu like symptoms as well as diarrhea.  . Hydralazine Shortness Of Breath  . Penicillins Cross Reactors Hives and Other (See Comments)    HIGH FEVER  Has patient had a PCN reaction causing immediate rash, facial/tongue/throat swelling, SOB or lightheadedness with hypotension: No Has patient had a PCN reaction causing SEVERE RASH INVOLVING MUCUS MEMBRANES or SKIN NECROSIS  #  #  #  YES  #  #  #  Has patient had a PCN reaction that required hospitalization: Unk Has patient had a PCN reaction occurring within the last 10 years: Unk If all of the above answers are "NO", then may proceed with Cephalosporin use.   [name] Lisinopril Other (See Comments)    Felt like she had the flu. Was very sick!!!  . Adhesive [Tape] Rash and Other (See Comments)    bruising   Objective: There were no vitals filed for this visit.    General: Patient is awake, alert, oriented x 3 and in no acute distress.  Dermatology: Skin is warm and dry bilateral with a continued healed ulceration  at right great toe. Minimal reactive callus to toe. No other acute signs of infection. All Nails are elongated and mycotic x10.    Vascular: Dorsalis Pedis pulse = 1/4 Bilateral,  Posterior Tibial pulse = 0/4 Bilateral,  Capillary Fill Time < 5 seconds  Neurologic: Protective sensation absent bilateral.  Musculosketal:  No pain with palpation to right toes, no pain with compression to calves bilateral. Asymptomatic bunion and hammertoe and pes planus bony deformities noted bilateral.  No results for input(s): GRAMSTAIN, LABORGA in the last 8760 hours.  Assessment and Plan:  Problem List Items Addressed This Visit      Endocrine   Diabetic neuropathy ([place])    Other Visit Diagnoses    Pain due to onychomycosis of toenails of both feet    -  Primary   PAD (peripheral artery disease) ([place])         -Examined patient -Nails x 10 debrided using sterile nail nipper without incident -Cont with daily inspection of feet in setting of Diabetes with PAD -Patient met with Pediorthotist, Rick and diabetic shoes were dispensed at today's visit. -Patient to return in 9 weeks for routine foot care or sooner if problems arise.  Landis Martins, DPM

## 2018-03-18 DIAGNOSIS — N2581 Secondary hyperparathyroidism of renal origin: Secondary | ICD-10-CM | POA: Diagnosis not present

## 2018-03-18 DIAGNOSIS — E1129 Type 2 diabetes mellitus with other diabetic kidney complication: Secondary | ICD-10-CM | POA: Diagnosis not present

## 2018-03-18 DIAGNOSIS — D631 Anemia in chronic kidney disease: Secondary | ICD-10-CM | POA: Diagnosis not present

## 2018-03-18 DIAGNOSIS — N186 End stage renal disease: Secondary | ICD-10-CM | POA: Diagnosis not present

## 2018-03-19 ENCOUNTER — Other Ambulatory Visit: Payer: Self-pay

## 2018-03-19 DIAGNOSIS — T82858A Stenosis of vascular prosthetic devices, implants and grafts, initial encounter: Secondary | ICD-10-CM | POA: Diagnosis not present

## 2018-03-19 DIAGNOSIS — I871 Compression of vein: Secondary | ICD-10-CM | POA: Diagnosis not present

## 2018-03-19 DIAGNOSIS — N186 End stage renal disease: Secondary | ICD-10-CM | POA: Diagnosis not present

## 2018-03-19 DIAGNOSIS — Z992 Dependence on renal dialysis: Secondary | ICD-10-CM | POA: Diagnosis not present

## 2018-03-19 MED ORDER — BASAGLAR KWIKPEN 100 UNIT/ML ~~LOC~~ SOPN: PEN_INJECTOR | SUBCUTANEOUS | 3 refills | Status: DC

## 2018-03-20 DIAGNOSIS — N186 End stage renal disease: Secondary | ICD-10-CM | POA: Diagnosis not present

## 2018-03-20 DIAGNOSIS — D631 Anemia in chronic kidney disease: Secondary | ICD-10-CM | POA: Diagnosis not present

## 2018-03-20 DIAGNOSIS — E1129 Type 2 diabetes mellitus with other diabetic kidney complication: Secondary | ICD-10-CM | POA: Diagnosis not present

## 2018-03-20 DIAGNOSIS — N2581 Secondary hyperparathyroidism of renal origin: Secondary | ICD-10-CM | POA: Diagnosis not present

## 2018-03-22 NOTE — Progress Notes (Signed)
Patient came in today to pick up diabetic shoes and custom inserts.  Same was well pleased with fit and function.   The patient could ambulate without any discomfort; there were no signs of any quality issues. The foot ortheses offered full contact with plantar surface and contoured the arch well.   The shoes fit well with no heel slippage and areas of pressure concern.   Patient advised to contact us if any problems arise.  Patient also advised on how to report any issues.Patient came in today to pick up diabetic shoes and custom inserts.  Same was well pleased with fit and function.   The patient could ambulate without any discomfort; there were no signs of any quality issues. The foot ortheses offered full contact with plantar surface and contoured the arch well.   The shoes fit well with no heel slippage and areas of pressure concern.   Patient advised to contact us if any problems arise.  Patient also advised on how to report any issues.

## 2018-03-23 DIAGNOSIS — E1129 Type 2 diabetes mellitus with other diabetic kidney complication: Secondary | ICD-10-CM | POA: Diagnosis not present

## 2018-03-23 DIAGNOSIS — N186 End stage renal disease: Secondary | ICD-10-CM | POA: Diagnosis not present

## 2018-03-23 DIAGNOSIS — N2581 Secondary hyperparathyroidism of renal origin: Secondary | ICD-10-CM | POA: Diagnosis not present

## 2018-03-23 DIAGNOSIS — D631 Anemia in chronic kidney disease: Secondary | ICD-10-CM | POA: Diagnosis not present

## 2018-03-25 DIAGNOSIS — N186 End stage renal disease: Secondary | ICD-10-CM | POA: Diagnosis not present

## 2018-03-25 DIAGNOSIS — D631 Anemia in chronic kidney disease: Secondary | ICD-10-CM | POA: Diagnosis not present

## 2018-03-25 DIAGNOSIS — E1129 Type 2 diabetes mellitus with other diabetic kidney complication: Secondary | ICD-10-CM | POA: Diagnosis not present

## 2018-03-25 DIAGNOSIS — N2581 Secondary hyperparathyroidism of renal origin: Secondary | ICD-10-CM | POA: Diagnosis not present

## 2018-03-26 ENCOUNTER — Encounter: Payer: Self-pay | Admitting: Family Medicine

## 2018-03-26 DIAGNOSIS — E133553 Other specified diabetes mellitus with stable proliferative diabetic retinopathy, bilateral: Secondary | ICD-10-CM | POA: Diagnosis not present

## 2018-03-26 DIAGNOSIS — E103553 Type 1 diabetes mellitus with stable proliferative diabetic retinopathy, bilateral: Secondary | ICD-10-CM | POA: Diagnosis not present

## 2018-03-26 DIAGNOSIS — Z961 Presence of intraocular lens: Secondary | ICD-10-CM | POA: Diagnosis not present

## 2018-03-26 LAB — HM DIABETES EYE EXAM

## 2018-03-27 DIAGNOSIS — D631 Anemia in chronic kidney disease: Secondary | ICD-10-CM | POA: Diagnosis not present

## 2018-03-27 DIAGNOSIS — N186 End stage renal disease: Secondary | ICD-10-CM | POA: Diagnosis not present

## 2018-03-27 DIAGNOSIS — E1129 Type 2 diabetes mellitus with other diabetic kidney complication: Secondary | ICD-10-CM | POA: Diagnosis not present

## 2018-03-27 DIAGNOSIS — N2581 Secondary hyperparathyroidism of renal origin: Secondary | ICD-10-CM | POA: Diagnosis not present

## 2018-03-30 DIAGNOSIS — D631 Anemia in chronic kidney disease: Secondary | ICD-10-CM | POA: Diagnosis not present

## 2018-03-30 DIAGNOSIS — N2581 Secondary hyperparathyroidism of renal origin: Secondary | ICD-10-CM | POA: Diagnosis not present

## 2018-03-30 DIAGNOSIS — N186 End stage renal disease: Secondary | ICD-10-CM | POA: Diagnosis not present

## 2018-03-30 DIAGNOSIS — E1129 Type 2 diabetes mellitus with other diabetic kidney complication: Secondary | ICD-10-CM | POA: Diagnosis not present

## 2018-03-31 ENCOUNTER — Ambulatory Visit (INDEPENDENT_AMBULATORY_CARE_PROVIDER_SITE_OTHER): Payer: Medicare Other | Admitting: Cardiovascular Disease

## 2018-03-31 ENCOUNTER — Encounter: Payer: Self-pay | Admitting: Cardiovascular Disease

## 2018-03-31 VITALS — BP 110/60 | HR 70 | Ht 70.5 in | Wt 223.4 lb

## 2018-03-31 DIAGNOSIS — I739 Peripheral vascular disease, unspecified: Secondary | ICD-10-CM | POA: Diagnosis not present

## 2018-03-31 DIAGNOSIS — I5022 Chronic systolic (congestive) heart failure: Secondary | ICD-10-CM | POA: Diagnosis not present

## 2018-03-31 DIAGNOSIS — I2510 Atherosclerotic heart disease of native coronary artery without angina pectoris: Secondary | ICD-10-CM | POA: Diagnosis not present

## 2018-03-31 DIAGNOSIS — Z9861 Coronary angioplasty status: Secondary | ICD-10-CM | POA: Diagnosis not present

## 2018-03-31 DIAGNOSIS — E785 Hyperlipidemia, unspecified: Secondary | ICD-10-CM

## 2018-03-31 NOTE — Patient Instructions (Addendum)
Medication Instructions:  Your physician recommends that you continue on your current medications as directed. Please refer to the Current Medication list given to you today.   If you need a refill on your cardiac medications before your next appointment, please call your pharmacy.   Lab work: None ordered If you have labs (blood work) drawn today and your tests are completely normal, you will receive your results only by: [name] MyChart Message (if you have MyChart) OR . A paper copy in the mail If you have any lab test that is abnormal or we need to change your treatment, we will call you to review the results.  Testing/Procedures: Your physician has requested that you have a lower extremity arterial exercise duplex. During this test, exercise and ultrasound are used to evaluate arterial blood flow in the legs. Allow one hour for this exam. There are no restrictions or special instructions. (To be scheduled in Feb 2020)  Your physician has requested that you have an ankle brachial index (ABI). During this test an ultrasound and blood pressure cuff are used to evaluate the arteries that supply the arms and legs with blood. Allow thirty minutes for this exam. There are no restrictions or special instructions. (To be scheduled in Feb 2020)     Follow-Up: At [facility], you and your health needs are our priority.  As part of our continuing mission to provide you with exceptional heart care, we have created designated Provider Care Teams.  These Care Teams include your primary Cardiologist (physician) and Advanced Practice Providers (APPs -  Physician Assistants and Nurse Practitioners) who all work together to provide you with the care you need, when you need it. Your physician recommends that you schedule a follow-up appointment in: 6 months with Dr.Arida. Please call the office 2 months prior to schedule your appointment.   Any Other Special Instructions Will Be Listed Below (If  Applicable).

## 2018-03-31 NOTE — Progress Notes (Signed)
Cardiology Office Note   Date:  03/31/2018   ID:  Brooke Stafford, DOB October 29, 1955, MRN [id number]  PCP:  Charlott Rakes, MD  Cardiologist: Dr. Sallyanne Kuster Nephrologist: Dr. Jimmy Footman   No chief complaint on file.     History of Present Illness: Brooke Stafford is a 62 y.o. female who is here today for a follow-up visit regarding peripheral arterial disease.  She has known history of coronary artery disease with previous CABG and stent placement, chronic systolic and diastolic heart failure, insulin-requiring diabetes mellitus, hypertension, hyperlipidemia and end-stage renal disease on hemodialysis on Monday Wednesday and Friday. She is known to have peripheral arterial disease with previous left SFA stent placement in 2012 and 2014.  She was seen in July 2018 for right great toe ulceration.  Angiography showed diffuse right proximal and mid SFA disease with short occlusion distally as well as short proximal right popliteal artery stenosis and three-vessel runoff below the knee. I performed successful drug-coated balloon angioplasty and 2 self-expanding stent placement to both the popliteal and SFA.  The left SFA stent was noted to be occluded by duplex. Most recent Doppler studies in February showed normal ABI with patent right SFA popliteal artery stent.  She has been doing well with no recent chest pain, shortness of breath or palpitations.  No leg claudication.  No new ulceration.  Past Medical History:  Diagnosis Date  . Anemia   . Anginal pain ([place])   . Anxiety   . Arthritis    "stiff fingers and knees" (08/04/2013), (12/12/2014)  . Asthma   . CAD (coronary artery disease) 2002; 2015   CABG x 6 2002, cath 2011- med Rx stent DES VG-Diag  . CAD (coronary artery disease) of artery bypass graft; DES to VG-Diag 09/28/13 11/09/2013  . Cataract   . CHF (congestive heart failure) ([place])    "in 2002" (11/26/2012)  . Chronic bronchitis ([place])    "q year; in the winter"   . CKD  (chronic kidney disease)    stage 4, followed by [facility]  . Coronary artery disease 2002   CABG x 6. Cath 5/11- med Rx  . Diabetic neuropathy ([place])   . GERD (gastroesophageal reflux disease)   . Gout    "right big toe"  . Headache    "~ q week" (08/04/2013); "~ twice/month" (12/12/2014)  . History of blood transfusion 2002   "when I had OHS"  . Hyperlipidemia   . Hypertension   . Hypothyroid    treated  . Migraines    "couple times/year" (08/04/2013), (12/12/2014)  . Myocardial infarction ([place]) 2000; 2002; 2011  . Obesity (BMI 35.0-39.9 without comorbidity)   . Peripheral vascular disease ([place]) 12/12   LSFA PTA  . Pneumonia    "3 times I think" (12/12/2014)  . Stroke [facility])    " mini stroke"  . Type II diabetes mellitus ([place])     Past Surgical History:  Procedure Laterality Date  . ABDOMINAL AORTAGRAM N/A 04/05/2011   Procedure: ABDOMINAL AORTAGRAM;  Surgeon: Lorretta Harp, MD;  Location: [facility] CATH LAB;  Service: Cardiovascular;  Laterality: N/A;  . ABDOMINAL AORTOGRAM N/A 10/30/2016   Procedure: Abdominal Aortogram;  Surgeon: Wellington Hampshire, MD;  Location: [place] CV LAB;  Service: Cardiovascular;  Laterality: N/A;  . APPENDECTOMY  1980  . AV FISTULA PLACEMENT Left 05/10/2016   Procedure: LEFT ARM ARTERIOVENOUS (AV) FISTULA CREATION;  Surgeon: Rosetta Posner, MD;  Location: [place];  Service: Vascular;  Laterality: Left;  . BASCILIC VEIN TRANSPOSITION Left 06/26/2016   Procedure: SECOND STAGE BASILIC VEIN TRANSPOSITION;  Surgeon: Rosetta Posner, MD;  Location: [place];  Service: Vascular;  Laterality: Left;  . BREAST CYST EXCISION Right 1970's  . CARDIAC CATHETERIZATION  2002  . CARDIAC CATHETERIZATION N/A 12/12/2014   Procedure: Left Heart Cath and Cors/Grafts Angiography;  Surgeon: Peter M Martinique, MD;  Location: [place] CV LAB;  Service: Cardiovascular;  Laterality: N/A;  . CARDIAC CATHETERIZATION N/A 12/12/2014   Procedure: Coronary Stent Intervention;  Surgeon:  Peter M Martinique, MD;  Location: [place] CV LAB;  Service: Cardiovascular;  Laterality: N/A;  . CATARACT EXTRACTION, BILATERAL    . CESAREAN SECTION  1978; 1980  . CHOLECYSTECTOMY  1982  . COLONOSCOPY WITH PROPOFOL N/A 11/03/2017   Procedure: COLONOSCOPY WITH PROPOFOL;  Surgeon: Doran Stabler, MD;  Location: [facility] ENDOSCOPY;  Service: Gastroenterology;  Laterality: N/A;  . CORONARY ANGIOPLASTY WITH STENT PLACEMENT  2004; 2012   "I have 2 stents" (08/04/2013)  . CORONARY ANGIOPLASTY WITH STENT PLACEMENT  09/28/13   PTCA/ DES Xience stent to VG-Diag   . CORONARY ARTERY BYPASS GRAFT  11/20/2000   x6 LIMA to distal LAD, svg to first diag, svg to ramus intermediate branch and swquential SVG to cir marginal branch, SVG to posterior descending coronary and sequential SVG to first right posterolateral branch  . eye injections    . INSERTION OF DIALYSIS CATHETER Right 05/10/2016   Procedure: INSERTION OF DIALYSIS CATHETER - Right Internal Jugular Placement;  Surgeon: Rosetta Posner, MD;  Location: [place];  Service: Vascular;  Laterality: Right;  . LEFT HEART CATHETERIZATION WITH CORONARY/GRAFT ANGIOGRAM N/A 09/28/2013   Procedure: LEFT HEART CATHETERIZATION WITH Beatrix Fetters;  Surgeon: Peter M Martinique, MD;  Location[facility] CATH LAB;  Service: Cardiovascular;  Laterality: N/A;  . LOWER EXTREMITY ANGIOGRAM  12/01/2012   Procedure: LOWER EXTREMITY ANGIOGRAM;  Surgeon: Lorretta Harp, MD;  Location: [facility] CATH LAB;  Service: Cardiovascular;;  . LOWER EXTREMITY ANGIOGRAPHY Right 10/30/2016   Procedure: Lower Extremity Angiography;  Surgeon: Wellington Hampshire, MD;  Location: [place] CV LAB;  Service: Cardiovascular;  Laterality: Right;  . NM MYOCAR PERF WALL MOTION  08/27/2004   negative  . PERCUTANEOUS STENT INTERVENTION Left 12/01/2012   Procedure: PERCUTANEOUS STENT INTERVENTION;  Surgeon: Lorretta Harp, MD;  Location: [facility] CATH LAB;  Service: Cardiovascular;  Laterality: Left;  Left SFA  . PERIPHERAL  ARTERIAL STENT GRAFT Left    SFA/notes 04/07/2011 (11/30/2012)  . PERIPHERAL VASCULAR ATHERECTOMY Right 10/30/2016   Procedure: Peripheral Vascular Atherectomy;  Surgeon: Wellington Hampshire, MD;  Location: [place] CV LAB;  Service: Cardiovascular;  Laterality: Right;  cancel unable to  . PERIPHERAL VASCULAR INTERVENTION Right 10/30/2016   Procedure: Peripheral Vascular Intervention;  Surgeon: Wellington Hampshire, MD;  Location: [place] CV LAB;  Service: Cardiovascular;  Laterality: Right;  SFA  . POLYPECTOMY  11/03/2017   Procedure: POLYPECTOMY;  Surgeon: Doran Stabler, MD;  Location: [facility];  Service: Gastroenterology;;  . RENAL ANGIOGRAM N/A 04/05/2011   Procedure: RENAL ANGIOGRAM;  Surgeon: Lorretta Harp, MD;  Location: [facility] CATH LAB;  Service: Cardiovascular;  Laterality: N/A;  . TUBAL LIGATION  1980     Current Outpatient Medications  Medication Sig Dispense Refill  . allopurinol (ZYLOPRIM) 300 MG tablet Take 1 tablet (300 mg total) by mouth daily. 90 tablet 1  . aspirin 81 MG chewable tablet Chew 1 tablet (81  mg total) by mouth daily. 30 tablet 10  . atorvastatin (LIPITOR) 40 MG tablet Take 1 tablet (40 mg total) by mouth every evening. 90 tablet 3  . AURYXIA 1 GM 210 MG(Fe) tablet TAKE 2 TABLETS BY MOUTH THREE TIMES A DAY WITH MEALS.   SWALLOW WHOLE, DO NOT CHEW OR CRUSH MEDICATION  1  . Blood Glucose Monitoring Suppl (ACCU-CHEK AVIVA CONNECT) w/Device KIT 16 Units by Does not apply route daily. 1 kit 0  . busPIRone (BUSPAR) 10 MG tablet Take 1 tablet (10 mg total) by mouth 3 (three) times daily. 90 tablet 6  . cetirizine (ZYRTEC) 10 MG tablet Take 10 mg by mouth daily.    . clopidogrel (PLAVIX) 75 MG tablet Take 1 tablet (75 mg total) by mouth daily. 90 tablet 1  . fluticasone (FLONASE) 50 MCG/ACT nasal spray Place 1 spray into both nostrils daily. (Patient taking differently: Place 2 sprays into both nostrils daily. ) 48 g 2  . gabapentin (NEURONTIN) 400 MG capsule Take  1 capsule (400 mg total) by mouth at bedtime. 30 capsule 6  . glucose blood (ACCU-CHEK ACTIVE STRIPS) test strip Use as instructed 100 each 12  . hydroxychloroquine (PLAQUENIL) 200 MG tablet TAKE 2 TABLETS BY MOUTH EVERY DAY WITH FOOD OR MILK  0  . hydrOXYzine (ATARAX/VISTARIL) 25 MG tablet Take 0.5 tablets (12.5 mg total) by mouth daily as needed. (Patient taking differently: Take 12.5 mg by mouth daily as needed for anxiety. ) 30 tablet 0  . insulin aspart (NOVOLOG FLEXPEN) 100 UNIT/ML FlexPen Inject 5-8 Units into the skin 3 (three) times daily with meals. 15 mL 11  . Insulin Glargine (BASAGLAR KWIKPEN) 100 UNIT/ML SOPN INJECT 16 UNITS INTO THE SKIN AT BEDTIME 45 mL 3  . Insulin Pen Needle 32G X 4 MM MISC Use 4x a day 200 each 3  . ipratropium (ATROVENT) 0.03 % nasal spray Place 2 sprays into both nostrils every 12 (twelve) hours. (Patient taking differently: Place 2 sprays into both nostrils 2 (two) times daily as needed for rhinitis. ) 30 mL 6  . isosorbide mononitrate (IMDUR) 60 MG 24 hr tablet Take 1 tablet (60 mg total) by mouth daily. 90 tablet 1  . Lancets (ACCU-CHEK SOFT TOUCH) lancets Use as instructed 100 each 12  . levothyroxine (SYNTHROID, LEVOTHROID) 25 MCG tablet TAKE 1 TABLET BY MOUTH EVERY MORNING BEFORE BREAKFAST 30 tablet 2  . lidocaine-prilocaine (EMLA) cream Apply 1 application topically as needed (for port access).   4  . LINZESS 145 MCG CAPS capsule TAKE 1 CAPSULE(145 MCG) BY MOUTH DAILY BEFORE BREAKFAST 30 capsule 0  . metoCLOPramide (REGLAN) 5 MG tablet Take 1 tablet (5 mg total) by mouth every 8 (eight) hours as needed for nausea. 90 tablet 3  . metoprolol tartrate (LOPRESSOR) 25 MG tablet Take 0.5 tablets (12.5 mg total) by mouth 4 (four) times a week on Monday, Wednesday, Friday, and Saturday evenings. 45 tablet 3  . Na Sulfate-K Sulfate-Mg Sulf 17.5-3.13-1.6 GM/177ML SOLN Suprep-Use as directed 354 mL 0  . NOVOLOG FLEXPEN 100 UNIT/ML FlexPen ADMINISTER 5 UNITS UNDER THE  SKIN THREE TIMES DAILY WITH MEALS 15 mL 0  . ORENCIA CLICKJECT 412 MG/ML SOAJ     . RENVELA 800 MG tablet TAKE 3 TABLETS BY MOUTH THREE TIMES DAILY WITH MEALS  3  . sevelamer (RENAGEL) 800 MG tablet Take 2,400 mg by mouth 3 (three) times daily with meals.    . traMADol (ULTRAM) 50 MG tablet Take 1 tablet (  50 mg total) by mouth every 12 (twelve) hours as needed. (Patient taking differently: Take 50 mg by mouth every 12 (twelve) hours as needed for moderate pain. ) 40 tablet 1  . nitroGLYCERIN (NITROSTAT) 0.4 MG SL tablet Place 1 tablet (0.4 mg total) under the tongue every 5 (five) minutes as needed. (Patient not taking: Reported on 03/31/2018) 30 tablet 2   No current facility-administered medications for this visit.     Allergies:   Digoxin and related; Hydralazine; Penicillins cross reactors; Lisinopril; and Adhesive [tape]    Social History:  The patient  reports that she quit smoking about 18 years ago. Her smoking use included cigarettes. She smoked 0.00 packs per day for 25.00 years. She has never used smokeless tobacco. She reports that she does not drink alcohol or use drugs.   Family History:  The patient's family history includes Diabetes in her mother and sister; Hyperlipidemia in her sister; Hypertension in her brother, father, mother, and sister; Stroke in her mother.    ROS:  Please see the history of present illness.   Otherwise, review of systems are positive for none.   All other systems are reviewed and negative.    PHYSICAL EXAM: VS:  BP 110/60   Pulse 70   Ht 5' 10.5" (1.791 m)   Wt 223 lb 6.4 oz (101.3 kg)   LMP 10/13/2014 (Exact Date)   BMI 31.60 kg/m  , BMI Body mass index is 31.6 kg/m. GEN: Well nourished, well developed, in no acute distress  HEENT: normal  Neck: no JVD, carotid bruits, or masses Cardiac: RRR; no murmurs, rubs, or gallops,no edema  Respiratory:  clear to auscultation bilaterally, normal work of breathing GI: soft, nontender, nondistended, +  BS MS: no deformity or atrophy  Skin: warm and dry, no rash Neuro:  Strength and sensation are intact Psych: euthymic mood, full affect No lower extremity ulcerations.  She has a weakly palpable right posterior tibial pulse.   EKG:  EKG is not ordered today.    Recent Labs: 11/03/2017: Hemoglobin 10.5; Potassium 4.1; Sodium 138 01/27/2018: TSH 2.500    Lipid Panel    Component Value Date/Time   CHOL 118 07/22/2017 1044   TRIG 160 (H) 07/22/2017 1044   HDL 43 07/22/2017 1044   CHOLHDL 2.7 07/22/2017 1044   CHOLHDL 3.6 07/25/2015 2037   VLDL 44 (H) 07/25/2015 2037   LDLCALC 43 07/22/2017 1044      Wt Readings from Last 3 Encounters:  03/31/18 223 lb 6.4 oz (101.3 kg)  02/19/18 222 lb 9.6 oz (101 kg)  01/27/18 224 lb 3.2 oz (101.7 kg)      No flowsheet data found.    ASSESSMENT AND PLAN:  1.  Peripheral arterial disease :  Status post successful endovascular intervention on the right SFA and popliteal artery in 2018.  Continue dual antiplatelet therapy for now as tolerated. She does have occluded stent in the left SFA but she is asymptomatic. Continue to monitor. Repeat vascular studies in February of 2020.  2. End-stage renal disease on hemodialysis: stable.   3. Chronic systolic/diastolic heart failure: She appears to be euvolemic.  4. Coronary artery disease involving native coronary arteries without angina: Continue medical therapy.  5. Hyperlipidemia: Continue treatment with atorvastatin with a target LDL of less than 70.  Disposition:   FU with me in 6 months  Signed,  Kathlyn Sacramento, MD  03/31/2018 12:11 PM    [place]

## 2018-04-01 DIAGNOSIS — N2581 Secondary hyperparathyroidism of renal origin: Secondary | ICD-10-CM | POA: Diagnosis not present

## 2018-04-01 DIAGNOSIS — D631 Anemia in chronic kidney disease: Secondary | ICD-10-CM | POA: Diagnosis not present

## 2018-04-01 DIAGNOSIS — E039 Hypothyroidism, unspecified: Secondary | ICD-10-CM | POA: Diagnosis not present

## 2018-04-01 DIAGNOSIS — E1129 Type 2 diabetes mellitus with other diabetic kidney complication: Secondary | ICD-10-CM | POA: Diagnosis not present

## 2018-04-01 DIAGNOSIS — N186 End stage renal disease: Secondary | ICD-10-CM | POA: Diagnosis not present

## 2018-04-03 DIAGNOSIS — N186 End stage renal disease: Secondary | ICD-10-CM | POA: Diagnosis not present

## 2018-04-03 DIAGNOSIS — D631 Anemia in chronic kidney disease: Secondary | ICD-10-CM | POA: Diagnosis not present

## 2018-04-03 DIAGNOSIS — N2581 Secondary hyperparathyroidism of renal origin: Secondary | ICD-10-CM | POA: Diagnosis not present

## 2018-04-03 DIAGNOSIS — E1129 Type 2 diabetes mellitus with other diabetic kidney complication: Secondary | ICD-10-CM | POA: Diagnosis not present

## 2018-04-05 DIAGNOSIS — D631 Anemia in chronic kidney disease: Secondary | ICD-10-CM | POA: Diagnosis not present

## 2018-04-05 DIAGNOSIS — E1129 Type 2 diabetes mellitus with other diabetic kidney complication: Secondary | ICD-10-CM | POA: Diagnosis not present

## 2018-04-05 DIAGNOSIS — N2581 Secondary hyperparathyroidism of renal origin: Secondary | ICD-10-CM | POA: Diagnosis not present

## 2018-04-05 DIAGNOSIS — N186 End stage renal disease: Secondary | ICD-10-CM | POA: Diagnosis not present

## 2018-04-07 DIAGNOSIS — N2581 Secondary hyperparathyroidism of renal origin: Secondary | ICD-10-CM | POA: Diagnosis not present

## 2018-04-07 DIAGNOSIS — D631 Anemia in chronic kidney disease: Secondary | ICD-10-CM | POA: Diagnosis not present

## 2018-04-07 DIAGNOSIS — E1129 Type 2 diabetes mellitus with other diabetic kidney complication: Secondary | ICD-10-CM | POA: Diagnosis not present

## 2018-04-07 DIAGNOSIS — N186 End stage renal disease: Secondary | ICD-10-CM | POA: Diagnosis not present

## 2018-04-08 ENCOUNTER — Other Ambulatory Visit: Payer: Self-pay | Admitting: Cardiovascular Disease

## 2018-04-10 DIAGNOSIS — N2581 Secondary hyperparathyroidism of renal origin: Secondary | ICD-10-CM | POA: Diagnosis not present

## 2018-04-10 DIAGNOSIS — D631 Anemia in chronic kidney disease: Secondary | ICD-10-CM | POA: Diagnosis not present

## 2018-04-10 DIAGNOSIS — E1129 Type 2 diabetes mellitus with other diabetic kidney complication: Secondary | ICD-10-CM | POA: Diagnosis not present

## 2018-04-10 DIAGNOSIS — N186 End stage renal disease: Secondary | ICD-10-CM | POA: Diagnosis not present

## 2018-04-12 DIAGNOSIS — N2581 Secondary hyperparathyroidism of renal origin: Secondary | ICD-10-CM | POA: Diagnosis not present

## 2018-04-12 DIAGNOSIS — D631 Anemia in chronic kidney disease: Secondary | ICD-10-CM | POA: Diagnosis not present

## 2018-04-12 DIAGNOSIS — E1129 Type 2 diabetes mellitus with other diabetic kidney complication: Secondary | ICD-10-CM | POA: Diagnosis not present

## 2018-04-12 DIAGNOSIS — N186 End stage renal disease: Secondary | ICD-10-CM | POA: Diagnosis not present

## 2018-04-14 DIAGNOSIS — E1129 Type 2 diabetes mellitus with other diabetic kidney complication: Secondary | ICD-10-CM | POA: Diagnosis not present

## 2018-04-14 DIAGNOSIS — N186 End stage renal disease: Secondary | ICD-10-CM | POA: Diagnosis not present

## 2018-04-14 DIAGNOSIS — D631 Anemia in chronic kidney disease: Secondary | ICD-10-CM | POA: Diagnosis not present

## 2018-04-14 DIAGNOSIS — N2581 Secondary hyperparathyroidism of renal origin: Secondary | ICD-10-CM | POA: Diagnosis not present

## 2018-04-15 DIAGNOSIS — Z992 Dependence on renal dialysis: Secondary | ICD-10-CM | POA: Diagnosis not present

## 2018-04-15 DIAGNOSIS — N186 End stage renal disease: Secondary | ICD-10-CM | POA: Diagnosis not present

## 2018-04-15 DIAGNOSIS — I129 Hypertensive chronic kidney disease with stage 1 through stage 4 chronic kidney disease, or unspecified chronic kidney disease: Secondary | ICD-10-CM | POA: Diagnosis not present

## 2018-04-17 DIAGNOSIS — N186 End stage renal disease: Secondary | ICD-10-CM | POA: Diagnosis not present

## 2018-04-17 DIAGNOSIS — D631 Anemia in chronic kidney disease: Secondary | ICD-10-CM | POA: Diagnosis not present

## 2018-04-17 DIAGNOSIS — N2581 Secondary hyperparathyroidism of renal origin: Secondary | ICD-10-CM | POA: Diagnosis not present

## 2018-04-17 DIAGNOSIS — E1129 Type 2 diabetes mellitus with other diabetic kidney complication: Secondary | ICD-10-CM | POA: Diagnosis not present

## 2018-04-20 DIAGNOSIS — N2581 Secondary hyperparathyroidism of renal origin: Secondary | ICD-10-CM | POA: Diagnosis not present

## 2018-04-20 DIAGNOSIS — D631 Anemia in chronic kidney disease: Secondary | ICD-10-CM | POA: Diagnosis not present

## 2018-04-20 DIAGNOSIS — E1129 Type 2 diabetes mellitus with other diabetic kidney complication: Secondary | ICD-10-CM | POA: Diagnosis not present

## 2018-04-20 DIAGNOSIS — N186 End stage renal disease: Secondary | ICD-10-CM | POA: Diagnosis not present

## 2018-04-21 DIAGNOSIS — M109 Gout, unspecified: Secondary | ICD-10-CM | POA: Diagnosis not present

## 2018-04-21 DIAGNOSIS — E669 Obesity, unspecified: Secondary | ICD-10-CM | POA: Diagnosis not present

## 2018-04-21 DIAGNOSIS — Z6832 Body mass index (BMI) 32.0-32.9, adult: Secondary | ICD-10-CM | POA: Diagnosis not present

## 2018-04-21 DIAGNOSIS — Z79899 Other long term (current) drug therapy: Secondary | ICD-10-CM | POA: Diagnosis not present

## 2018-04-21 DIAGNOSIS — N186 End stage renal disease: Secondary | ICD-10-CM | POA: Diagnosis not present

## 2018-04-21 DIAGNOSIS — M2550 Pain in unspecified joint: Secondary | ICD-10-CM | POA: Diagnosis not present

## 2018-04-21 DIAGNOSIS — M0579 Rheumatoid arthritis with rheumatoid factor of multiple sites without organ or systems involvement: Secondary | ICD-10-CM | POA: Diagnosis not present

## 2018-04-22 DIAGNOSIS — N2581 Secondary hyperparathyroidism of renal origin: Secondary | ICD-10-CM | POA: Diagnosis not present

## 2018-04-22 DIAGNOSIS — N186 End stage renal disease: Secondary | ICD-10-CM | POA: Diagnosis not present

## 2018-04-22 DIAGNOSIS — E1129 Type 2 diabetes mellitus with other diabetic kidney complication: Secondary | ICD-10-CM | POA: Diagnosis not present

## 2018-04-22 DIAGNOSIS — D631 Anemia in chronic kidney disease: Secondary | ICD-10-CM | POA: Diagnosis not present

## 2018-04-24 DIAGNOSIS — D631 Anemia in chronic kidney disease: Secondary | ICD-10-CM | POA: Diagnosis not present

## 2018-04-24 DIAGNOSIS — N186 End stage renal disease: Secondary | ICD-10-CM | POA: Diagnosis not present

## 2018-04-24 DIAGNOSIS — N2581 Secondary hyperparathyroidism of renal origin: Secondary | ICD-10-CM | POA: Diagnosis not present

## 2018-04-24 DIAGNOSIS — E1129 Type 2 diabetes mellitus with other diabetic kidney complication: Secondary | ICD-10-CM | POA: Diagnosis not present

## 2018-04-27 DIAGNOSIS — N186 End stage renal disease: Secondary | ICD-10-CM | POA: Diagnosis not present

## 2018-04-27 DIAGNOSIS — N2581 Secondary hyperparathyroidism of renal origin: Secondary | ICD-10-CM | POA: Diagnosis not present

## 2018-04-27 DIAGNOSIS — E1129 Type 2 diabetes mellitus with other diabetic kidney complication: Secondary | ICD-10-CM | POA: Diagnosis not present

## 2018-04-27 DIAGNOSIS — D631 Anemia in chronic kidney disease: Secondary | ICD-10-CM | POA: Diagnosis not present

## 2018-04-29 DIAGNOSIS — N186 End stage renal disease: Secondary | ICD-10-CM | POA: Diagnosis not present

## 2018-04-29 DIAGNOSIS — D631 Anemia in chronic kidney disease: Secondary | ICD-10-CM | POA: Diagnosis not present

## 2018-04-29 DIAGNOSIS — N2581 Secondary hyperparathyroidism of renal origin: Secondary | ICD-10-CM | POA: Diagnosis not present

## 2018-04-29 DIAGNOSIS — E1129 Type 2 diabetes mellitus with other diabetic kidney complication: Secondary | ICD-10-CM | POA: Diagnosis not present

## 2018-05-01 DIAGNOSIS — D631 Anemia in chronic kidney disease: Secondary | ICD-10-CM | POA: Diagnosis not present

## 2018-05-01 DIAGNOSIS — N2581 Secondary hyperparathyroidism of renal origin: Secondary | ICD-10-CM | POA: Diagnosis not present

## 2018-05-01 DIAGNOSIS — N186 End stage renal disease: Secondary | ICD-10-CM | POA: Diagnosis not present

## 2018-05-01 DIAGNOSIS — E1129 Type 2 diabetes mellitus with other diabetic kidney complication: Secondary | ICD-10-CM | POA: Diagnosis not present

## 2018-05-04 ENCOUNTER — Other Ambulatory Visit: Payer: Self-pay | Admitting: Family Medicine

## 2018-05-04 DIAGNOSIS — I10 Essential (primary) hypertension: Secondary | ICD-10-CM

## 2018-05-04 DIAGNOSIS — E1129 Type 2 diabetes mellitus with other diabetic kidney complication: Secondary | ICD-10-CM | POA: Diagnosis not present

## 2018-05-04 DIAGNOSIS — I5042 Chronic combined systolic (congestive) and diastolic (congestive) heart failure: Secondary | ICD-10-CM

## 2018-05-04 DIAGNOSIS — N2581 Secondary hyperparathyroidism of renal origin: Secondary | ICD-10-CM | POA: Diagnosis not present

## 2018-05-04 DIAGNOSIS — N186 End stage renal disease: Secondary | ICD-10-CM | POA: Diagnosis not present

## 2018-05-04 DIAGNOSIS — D631 Anemia in chronic kidney disease: Secondary | ICD-10-CM | POA: Diagnosis not present

## 2018-05-05 ENCOUNTER — Other Ambulatory Visit: Payer: Self-pay | Admitting: Family Medicine

## 2018-05-05 ENCOUNTER — Ambulatory Visit: Payer: Self-pay | Admitting: Family Medicine

## 2018-05-05 DIAGNOSIS — I5042 Chronic combined systolic (congestive) and diastolic (congestive) heart failure: Secondary | ICD-10-CM

## 2018-05-05 DIAGNOSIS — I10 Essential (primary) hypertension: Secondary | ICD-10-CM

## 2018-05-06 DIAGNOSIS — N186 End stage renal disease: Secondary | ICD-10-CM | POA: Diagnosis not present

## 2018-05-06 DIAGNOSIS — N2581 Secondary hyperparathyroidism of renal origin: Secondary | ICD-10-CM | POA: Diagnosis not present

## 2018-05-06 DIAGNOSIS — E1129 Type 2 diabetes mellitus with other diabetic kidney complication: Secondary | ICD-10-CM | POA: Diagnosis not present

## 2018-05-06 DIAGNOSIS — D631 Anemia in chronic kidney disease: Secondary | ICD-10-CM | POA: Diagnosis not present

## 2018-05-08 DIAGNOSIS — D631 Anemia in chronic kidney disease: Secondary | ICD-10-CM | POA: Diagnosis not present

## 2018-05-08 DIAGNOSIS — N2581 Secondary hyperparathyroidism of renal origin: Secondary | ICD-10-CM | POA: Diagnosis not present

## 2018-05-08 DIAGNOSIS — E1129 Type 2 diabetes mellitus with other diabetic kidney complication: Secondary | ICD-10-CM | POA: Diagnosis not present

## 2018-05-08 DIAGNOSIS — N186 End stage renal disease: Secondary | ICD-10-CM | POA: Diagnosis not present

## 2018-05-11 DIAGNOSIS — E1129 Type 2 diabetes mellitus with other diabetic kidney complication: Secondary | ICD-10-CM | POA: Diagnosis not present

## 2018-05-11 DIAGNOSIS — N2581 Secondary hyperparathyroidism of renal origin: Secondary | ICD-10-CM | POA: Diagnosis not present

## 2018-05-11 DIAGNOSIS — D631 Anemia in chronic kidney disease: Secondary | ICD-10-CM | POA: Diagnosis not present

## 2018-05-11 DIAGNOSIS — N186 End stage renal disease: Secondary | ICD-10-CM | POA: Diagnosis not present

## 2018-05-11 IMAGING — DX DG CHEST 2V
2 series · 2 of 2 positions shown · non-contrast
Comparison: 11/29/2016

CLINICAL DATA: Productive cough and fever and chills

EXAM:
CHEST  2 VIEW

[chest pa]
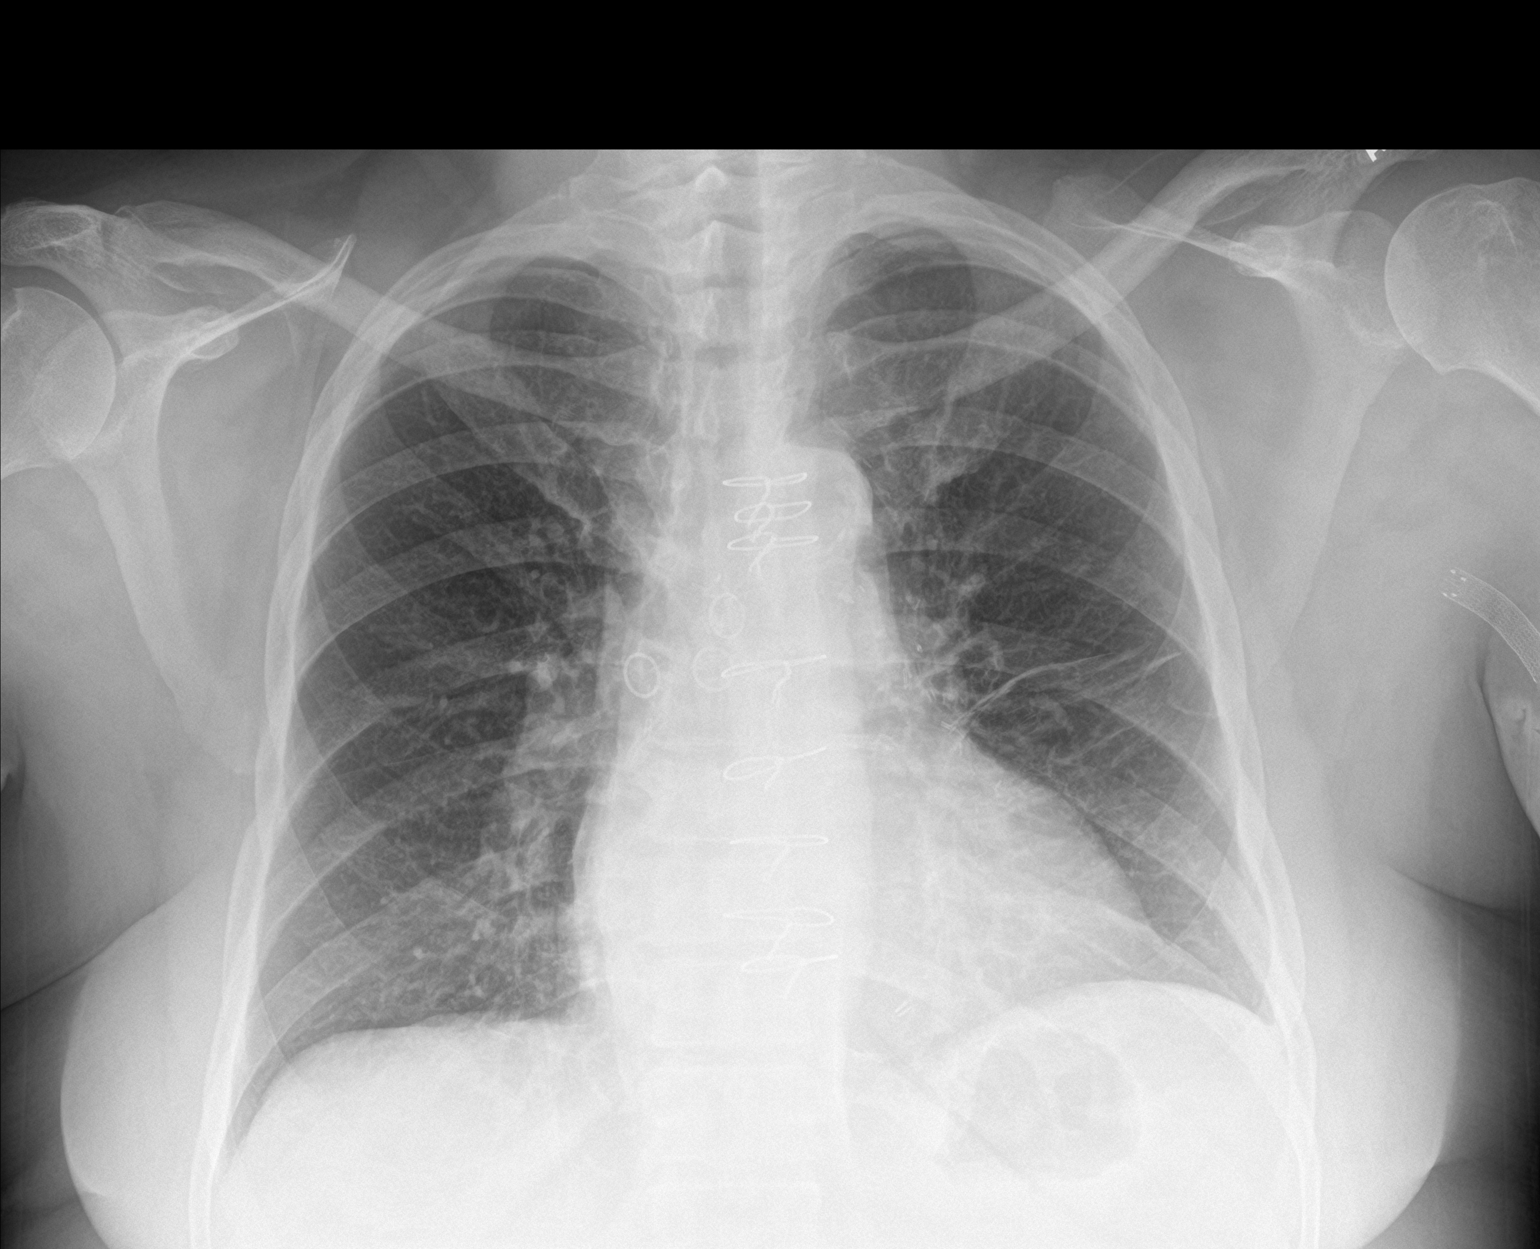

[chest lat]
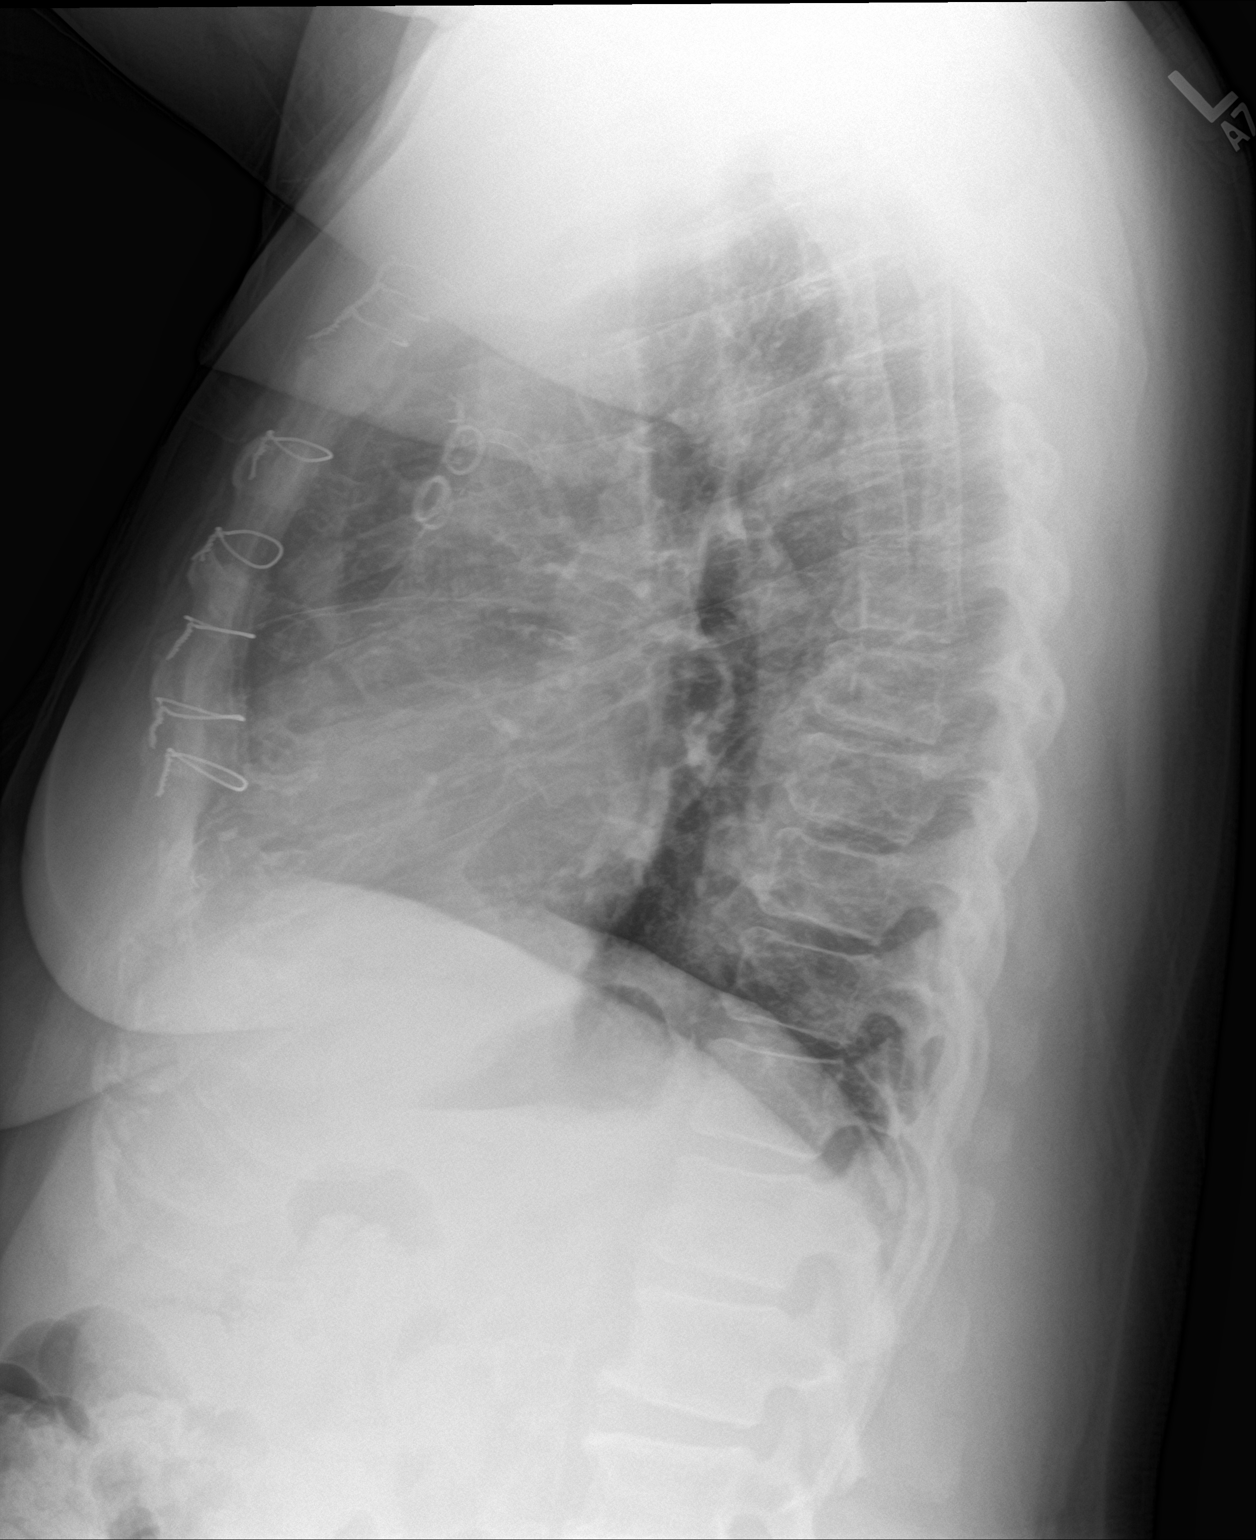

[2 of 2 positions shown; findings below may reference images not displayed]

FINDINGS: Cardiac shadow is stable. Postsurgical changes are again seen.
Scarring is noted in the left mid lung and left base stable from the
prior exam. No acute infiltrate or sizable effusion is seen. No
acute bony abnormality is noted.
IMPRESSION: No active cardiopulmonary disease.

## 2018-05-12 ENCOUNTER — Ambulatory Visit: Payer: Medicare Other | Attending: Family Medicine | Admitting: Family Medicine

## 2018-05-12 ENCOUNTER — Encounter: Payer: Self-pay | Admitting: Family Medicine

## 2018-05-12 VITALS — BP 106/62 | HR 77 | Temp 98.3°F | Ht 70.5 in | Wt 221.2 lb

## 2018-05-12 DIAGNOSIS — I10 Essential (primary) hypertension: Secondary | ICD-10-CM | POA: Diagnosis not present

## 2018-05-12 DIAGNOSIS — N186 End stage renal disease: Secondary | ICD-10-CM | POA: Diagnosis not present

## 2018-05-12 DIAGNOSIS — E1149 Type 2 diabetes mellitus with other diabetic neurological complication: Secondary | ICD-10-CM | POA: Insufficient documentation

## 2018-05-12 DIAGNOSIS — L910 Hypertrophic scar: Secondary | ICD-10-CM | POA: Diagnosis not present

## 2018-05-12 DIAGNOSIS — M069 Rheumatoid arthritis, unspecified: Secondary | ICD-10-CM | POA: Insufficient documentation

## 2018-05-12 DIAGNOSIS — I5042 Chronic combined systolic (congestive) and diastolic (congestive) heart failure: Secondary | ICD-10-CM | POA: Insufficient documentation

## 2018-05-12 DIAGNOSIS — I739 Peripheral vascular disease, unspecified: Secondary | ICD-10-CM | POA: Diagnosis not present

## 2018-05-12 DIAGNOSIS — Z79899 Other long term (current) drug therapy: Secondary | ICD-10-CM | POA: Diagnosis not present

## 2018-05-12 DIAGNOSIS — E039 Hypothyroidism, unspecified: Secondary | ICD-10-CM | POA: Diagnosis not present

## 2018-05-12 DIAGNOSIS — Z794 Long term (current) use of insulin: Secondary | ICD-10-CM | POA: Diagnosis not present

## 2018-05-12 DIAGNOSIS — Z7901 Long term (current) use of anticoagulants: Secondary | ICD-10-CM | POA: Insufficient documentation

## 2018-05-12 DIAGNOSIS — E1122 Type 2 diabetes mellitus with diabetic chronic kidney disease: Secondary | ICD-10-CM | POA: Diagnosis not present

## 2018-05-12 DIAGNOSIS — E118 Type 2 diabetes mellitus with unspecified complications: Secondary | ICD-10-CM | POA: Diagnosis not present

## 2018-05-12 DIAGNOSIS — Z7982 Long term (current) use of aspirin: Secondary | ICD-10-CM | POA: Diagnosis not present

## 2018-05-12 DIAGNOSIS — Z7902 Long term (current) use of antithrombotics/antiplatelets: Secondary | ICD-10-CM | POA: Diagnosis not present

## 2018-05-12 DIAGNOSIS — E1140 Type 2 diabetes mellitus with diabetic neuropathy, unspecified: Secondary | ICD-10-CM | POA: Insufficient documentation

## 2018-05-12 DIAGNOSIS — I2510 Atherosclerotic heart disease of native coronary artery without angina pectoris: Secondary | ICD-10-CM | POA: Diagnosis not present

## 2018-05-12 LAB — POCT GLYCOSYLATED HEMOGLOBIN (HGB A1C): HbA1c, POC (controlled diabetic range): 5.8 % (ref 0.0–7.0)

## 2018-05-12 LAB — GLUCOSE, POCT (MANUAL RESULT ENTRY): POC Glucose: 101 mg/dl — AB (ref 70–99)

## 2018-05-12 MED ORDER — BASAGLAR KWIKPEN 100 UNIT/ML ~~LOC~~ SOPN: PEN_INJECTOR | SUBCUTANEOUS | 3 refills | Status: AC

## 2018-05-12 MED ORDER — TRIAMCINOLONE ACETONIDE 0.1 % EX CREA: 1.0000 "application " | TOPICAL_CREAM | Freq: Two times a day (BID) | CUTANEOUS | 1 refills | Status: AC

## 2018-05-12 MED ORDER — LEVOTHYROXINE SODIUM 25 MCG PO TABS: 25.0000 ug | ORAL_TABLET | Freq: Every day | ORAL | 2 refills | Status: DC

## 2018-05-12 MED ORDER — ISOSORBIDE MONONITRATE ER 60 MG PO TB24: 120.0000 mg | ORAL_TABLET | Freq: Every day | ORAL | 6 refills | Status: DC

## 2018-05-12 MED ORDER — GABAPENTIN 400 MG PO CAPS: 400.0000 mg | ORAL_CAPSULE | Freq: Every day | ORAL | 6 refills | Status: DC
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
## 2018-05-12 NOTE — Progress Notes (Deleted)
Patient needs refills on all medications

## 2018-05-12 NOTE — Patient Instructions (Signed)
Triamcinolone skin cream, ointment, lotion, or aerosol What is this medicine? TRIAMCINOLONE (trye am SIN oh lone) is a corticosteroid. It is used on the skin to reduce swelling, redness, itching, and allergic reactions. This medicine may be used for other purposes; ask your health care provider or pharmacist if you have questions. COMMON BRAND NAME(S): Aristocort, Aristocort A, Aristocort HP, Cinalog, Cinolar, DERMASORB TA Complete, Flutex, Kenalog, Pediaderm TA, SP Rx 228, Triacet, Trianex, Triderm What should I tell my health care provider before I take this medicine? They need to know if you have any of these conditions: -diabetes -infection, like tuberculosis, herpes, or fungal infection -large areas of burned or damaged skin -skin wasting or thinning -an unusual or allergic reaction to triamcinolone, corticosteroids, other medicines, foods, dyes, or preservatives -pregnant or trying to get pregnant -breast-feeding How should I use this medicine? This medicine is for external use only. Do not take by mouth. Follow the directions on the prescription label. Wash your hands before and after use. Apply a thin film of medicine to the affected area. Do not cover with a bandage or dressing unless your doctor or health care professional tells you to. Do not use on healthy skin or over large areas of skin. Do not get this medicine in your eyes. If you do, rinse out with plenty of cool tap water. It is important not to use more medicine than prescribed. Do not use your medicine more often than directed. Talk to your pediatrician regarding the use of this medicine in children. Special care may be needed. Elderly patients are more likely to have damaged skin through aging, and this may increase side effects. This medicine should only be used for brief periods and infrequently in older patients. Overdosage: If you think you have taken too much of this medicine contact a poison control center or emergency  room at once. NOTE: This medicine is only for you. Do not share this medicine with others. What if I miss a dose? If you miss a dose, use it as soon as you can. If it is almost time for your next dose, use only that dose. Do not use double or extra doses. What may interact with this medicine? Interactions are not expected. This list may not describe all possible interactions. Give your health care provider a list of all the medicines, herbs, non-prescription drugs, or dietary supplements you use. Also tell them if you smoke, drink alcohol, or use illegal drugs. Some items may interact with your medicine. What should I watch for while using this medicine? Tell your doctor or health care professional if your symptoms do not start to get better within one week. Do not use for more than 14 days. Do not use on healthy skin or over large areas of skin. Tell your doctor or health care professional if you are exposed to anyone with measles or chickenpox, or if you develop sores or blisters that do not heal properly. Do not use an airtight bandage to cover the affected area unless your doctor or health care professional tells you to. If you are to cover the area, follow the instructions carefully. Covering the area where the medicine is applied can increase the amount that passes through the skin and increases the risk of side effects. If treating the diaper area of a child, avoid covering the treated area with tight-fitting diapers or plastic pants. This may increase the amount of medicine that passes through the skin and increase the risk of serious side   effects. What side effects may I notice from receiving this medicine? Side effects that you should report to your doctor or health care professional as soon as possible: -burning or itching of the skin -dark red spots on the skin -infection -painful, red, pus filled blisters in hair follicles -thinning of the skin, sunburn more likely especially on the  face Side effects that usually do not require medical attention (report to your doctor or health care professional if they continue or are bothersome): -dry skin, irritation -unusual increased growth of hair on the face or body This list may not describe all possible side effects. Call your doctor for medical advice about side effects. You may report side effects to FDA at 1-800-FDA-1088. Where should I keep my medicine? Keep out of the reach of children. Store at room temperature between 15 and 30 degrees C (59 and 86 degrees F). Do not freeze. Throw away any unused medicine after the expiration date. NOTE: This sheet is a summary. It may not cover all possible information. If you have questions about this medicine, talk to your doctor, pharmacist, or health care provider.  2019 [employer] (2013-07-22 15:59:51)
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
## 2018-05-13 ENCOUNTER — Encounter: Payer: Self-pay | Admitting: Family Medicine

## 2018-05-13 DIAGNOSIS — N186 End stage renal disease: Secondary | ICD-10-CM | POA: Diagnosis not present

## 2018-05-13 DIAGNOSIS — N2581 Secondary hyperparathyroidism of renal origin: Secondary | ICD-10-CM | POA: Diagnosis not present

## 2018-05-13 DIAGNOSIS — E1129 Type 2 diabetes mellitus with other diabetic kidney complication: Secondary | ICD-10-CM | POA: Diagnosis not present

## 2018-05-13 DIAGNOSIS — D631 Anemia in chronic kidney disease: Secondary | ICD-10-CM | POA: Diagnosis not present

## 2018-05-15 ENCOUNTER — Other Ambulatory Visit: Payer: Self-pay | Admitting: Family Medicine

## 2018-05-15 DIAGNOSIS — N186 End stage renal disease: Secondary | ICD-10-CM | POA: Diagnosis not present

## 2018-05-15 DIAGNOSIS — D631 Anemia in chronic kidney disease: Secondary | ICD-10-CM | POA: Diagnosis not present

## 2018-05-15 DIAGNOSIS — M109 Gout, unspecified: Secondary | ICD-10-CM

## 2018-05-15 DIAGNOSIS — E1129 Type 2 diabetes mellitus with other diabetic kidney complication: Secondary | ICD-10-CM | POA: Diagnosis not present

## 2018-05-15 DIAGNOSIS — N2581 Secondary hyperparathyroidism of renal origin: Secondary | ICD-10-CM | POA: Diagnosis not present

## 2018-05-16 DIAGNOSIS — I129 Hypertensive chronic kidney disease with stage 1 through stage 4 chronic kidney disease, or unspecified chronic kidney disease: Secondary | ICD-10-CM | POA: Diagnosis not present

## 2018-05-16 DIAGNOSIS — Z992 Dependence on renal dialysis: Secondary | ICD-10-CM | POA: Diagnosis not present

## 2018-05-16 DIAGNOSIS — N186 End stage renal disease: Secondary | ICD-10-CM | POA: Diagnosis not present

## 2018-05-18 DIAGNOSIS — D631 Anemia in chronic kidney disease: Secondary | ICD-10-CM | POA: Diagnosis not present

## 2018-05-18 DIAGNOSIS — N186 End stage renal disease: Secondary | ICD-10-CM | POA: Diagnosis not present

## 2018-05-18 DIAGNOSIS — N2581 Secondary hyperparathyroidism of renal origin: Secondary | ICD-10-CM | POA: Diagnosis not present

## 2018-05-18 DIAGNOSIS — E1129 Type 2 diabetes mellitus with other diabetic kidney complication: Secondary | ICD-10-CM | POA: Diagnosis not present

## 2018-05-19 ENCOUNTER — Ambulatory Visit (INDEPENDENT_AMBULATORY_CARE_PROVIDER_SITE_OTHER): Payer: Medicare Other | Admitting: Sports Medicine

## 2018-05-19 ENCOUNTER — Encounter: Payer: Self-pay | Admitting: Sports Medicine

## 2018-05-19 DIAGNOSIS — B351 Tinea unguium: Secondary | ICD-10-CM | POA: Diagnosis not present

## 2018-05-19 DIAGNOSIS — I739 Peripheral vascular disease, unspecified: Secondary | ICD-10-CM

## 2018-05-19 DIAGNOSIS — M2040 Other hammer toe(s) (acquired), unspecified foot: Secondary | ICD-10-CM

## 2018-05-19 DIAGNOSIS — M79675 Pain in left toe(s): Secondary | ICD-10-CM | POA: Diagnosis not present

## 2018-05-19 DIAGNOSIS — M79674 Pain in right toe(s): Secondary | ICD-10-CM

## 2018-05-19 NOTE — Progress Notes (Signed)
Subjective: Brooke Stafford is a 63 y.o. female patient seen in office for follow up nail and callus care.  Patient reports that her right big toe and the area of callus at the very tip.  Patient has no other pedal complaints at this time.  FBS this AM not recorded but last A1c 5.8 like before.  Patient is also a dialysis patient and is on Plavix, follows with vascular like before.  Denies any other acute issues.   Patient Active Problem List   Diagnosis Date Noted  . Heme positive stool   . Benign neoplasm of ascending colon   . Benign neoplasm of transverse colon   . Benign neoplasm of descending colon   . Hypercholesterolemia 06/18/2017  . Coronary artery disease of autologous vein bypass graft with stable angina pectoris ([place]) 06/18/2017  . Diabetic neuropathy ([place]) 04/17/2017  . GERD (gastroesophageal reflux disease) 04/17/2017  . Foot ulcer, right ([place]) 11/28/2016  . Cervical polyp 11/12/2016  . AV (arteriovenous fistula) ([place]) 06/17/2016  . Mixed hyperlipidemia 05/21/2016  . ESRD (end stage renal disease) ([place])   . Hypokalemia 05/03/2016  . COPD exacerbation ([place]) 05/03/2016  . COPD (chronic obstructive pulmonary disease) ([place]) 04/11/2016  . CAP (community acquired pneumonia) 03/26/2016  . Acute respiratory failure with hypoxia ([place]) 03/26/2016  . Acute on chronic combined systolic and diastolic CHF (congestive heart failure) ([place]) 03/26/2016  . Medication management 03/21/2016  . Anemia of chronic disease 12/27/2015  . Acute on chronic systolic CHF (congestive heart failure) ([place])   . Acute CHF ([place]) 12/18/2015  . Hypertensive heart and renal disease with CHF and ESRD ([place]) 12/18/2015  . Constipation 12/18/2015  . Anxiety 11/02/2015  . Posterior circulation stroke ([place]) 11/02/2015  . Abnormality of gait 10/23/2015  . Depression 09/28/2015  . Diabetic retinopathy ([place]) 09/13/2015  . Grief reaction 08/24/2015  . Creatinine elevation 07/25/2015  . Chronic combined  systolic and diastolic congestive heart failure ([employer]) 07/25/2015  . Rash of hands 02/23/2015  . Essential hypertension   . Angina pectoris ([place]) 12/12/2014  . Abnormal nuclear stress test   . Acute combined systolic and diastolic heart failure ([place]) 12/02/2014  . Seasonal allergies 08/11/2014  . Gout 08/11/2014  . Encounter for screening mammogram for breast cancer 05/16/2014  . Screening for colon cancer 05/16/2014  . Type 2 diabetes mellitus with ESRD (end-stage renal disease) ([place]) 01/27/2014  . Atopic eczema 01/27/2014  . Precordial pain, atypical, negative MI, Musculature Skeletal pain  11/08/2013  . CKD (chronic kidney disease) stage 5, GFR less than 15 ml/min ([facility]) 11/08/2013  . Unstable angina ([place]) 09/28/2013  . Ischemic cardiomyopathy- new drop in EF 08/30/2013  . Chest pain 08/04/2013  . CAD -S/P PCI June 2015 and 12/14/14 02/01/2013  . Hypothyroidism, acquired 02/01/2013  . PVD, LSFA PTA 12/12 04/06/2011  . Hx of CABG x 6 2002 04/06/2011  . Dyslipidemia 04/06/2011   Current Outpatient Medications on File Prior to Visit  Medication Sig Dispense Refill  . allopurinol (ZYLOPRIM) 300 MG tablet TAKE 1 TABLET(300 MG) BY MOUTH DAILY 90 tablet 1  . aspirin 81 MG chewable tablet Chew 1 tablet (81 mg total) by mouth daily. 30 tablet 10  . atorvastatin (LIPITOR) 40 MG tablet Take 1 tablet (40 mg total) by mouth every evening. 90 tablet 3  . AURYXIA 1 GM 210 MG(Fe) tablet TAKE 2 TABLETS BY MOUTH THREE TIMES A DAY WITH MEALS.   SWALLOW WHOLE, DO NOT CHEW OR CRUSH MEDICATION  1  .  Blood Glucose Monitoring Suppl (ACCU-CHEK AVIVA CONNECT) w/Device KIT 16 Units by Does not apply route daily. 1 kit 0  . busPIRone (BUSPAR) 10 MG tablet Take 1 tablet (10 mg total) by mouth 3 (three) times daily. 90 tablet 6  . cetirizine (ZYRTEC) 10 MG tablet Take 10 mg by mouth daily.    . clopidogrel (PLAVIX) 75 MG tablet Take 1 tablet (75 mg total) by mouth daily. 90 tablet 1  . fluticasone (FLONASE) 50  MCG/ACT nasal spray Place 1 spray into both nostrils daily. (Patient taking differently: Place 2 sprays into both nostrils daily. ) 48 g 2  . gabapentin (NEURONTIN) 400 MG capsule Take 1 capsule (400 mg total) by mouth at bedtime. 30 capsule 6  . glucose blood (ACCU-CHEK ACTIVE STRIPS) test strip Use as instructed 100 each 12  . hydroxychloroquine (PLAQUENIL) 200 MG tablet TAKE 2 TABLETS BY MOUTH EVERY DAY WITH FOOD OR MILK  0  . hydrOXYzine (ATARAX/VISTARIL) 25 MG tablet Take 0.5 tablets (12.5 mg total) by mouth daily as needed. (Patient taking differently: Take 12.5 mg by mouth daily as needed for anxiety. ) 30 tablet 0  . insulin aspart (NOVOLOG FLEXPEN) 100 UNIT/ML FlexPen Inject 5-8 Units into the skin 3 (three) times daily with meals. 15 mL 11  . Insulin Glargine (BASAGLAR KWIKPEN) 100 UNIT/ML SOPN INJECT 10 UNITS INTO THE SKIN AT BEDTIME 45 mL 3  . Insulin Pen Needle 32G X 4 MM MISC Use 4x a day 200 each 3  . ipratropium (ATROVENT) 0.03 % nasal spray Place 2 sprays into both nostrils every 12 (twelve) hours. (Patient taking differently: Place 2 sprays into both nostrils 2 (two) times daily as needed for rhinitis. ) 30 mL 6  . isosorbide mononitrate (IMDUR) 60 MG 24 hr tablet Take 2 tablets (120 mg total) by mouth daily. 60 tablet 6  . Lancets (ACCU-CHEK SOFT TOUCH) lancets Use as instructed 100 each 12  . levothyroxine (SYNTHROID, LEVOTHROID) 25 MCG tablet Take 1 tablet (25 mcg total) by mouth daily before breakfast. 30 tablet 2  . lidocaine-prilocaine (EMLA) cream Apply 1 application topically as needed (for port access).   4  . LINZESS 145 MCG CAPS capsule TAKE 1 CAPSULE(145 MCG) BY MOUTH DAILY BEFORE BREAKFAST 30 capsule 0  . metoCLOPramide (REGLAN) 5 MG tablet Take 1 tablet (5 mg total) by mouth every 8 (eight) hours as needed for nausea. 90 tablet 3  . metoprolol tartrate (LOPRESSOR) 25 MG tablet TAKE 1/2 TABLET(12.5 MG) BY MOUTH TWICE DAILY 90 tablet 3  . Na Sulfate-K Sulfate-Mg Sulf  17.5-3.13-1.6 GM/177ML SOLN Suprep-Use as directed 354 mL 0  . nitroGLYCERIN (NITROSTAT) 0.4 MG SL tablet Place 1 tablet (0.4 mg total) under the tongue every 5 (five) minutes as needed. 30 tablet 2  . NOVOLOG FLEXPEN 100 UNIT/ML FlexPen ADMINISTER 5 UNITS UNDER THE SKIN THREE TIMES DAILY WITH MEALS 15 mL 0  . ORENCIA CLICKJECT 588 MG/ML SOAJ     . RENVELA 800 MG tablet TAKE 3 TABLETS BY MOUTH THREE TIMES DAILY WITH MEALS  3  . sevelamer (RENAGEL) 800 MG tablet Take 2,400 mg by mouth 3 (three) times daily with meals.    . traMADol (ULTRAM) 50 MG tablet Take 1 tablet (50 mg total) by mouth every 12 (twelve) hours as needed. (Patient taking differently: Take 50 mg by mouth every 12 (twelve) hours as needed for moderate pain. ) 40 tablet 1  . triamcinolone cream (KENALOG) 0.1 % Apply 1 application topically 2 (two)  times daily. 45 g 1   No current facility-administered medications on file prior to visit.    Allergies  Allergen Reactions  . Digoxin And Related Diarrhea and Other (See Comments)    TOXIC DRUG LEVELS Patient stated she almost died. Had flu like symptoms as well as diarrhea.  . Hydralazine Shortness Of Breath  . Penicillins Cross Reactors Hives and Other (See Comments)    HIGH FEVER  Has patient had a PCN reaction causing immediate rash, facial/tongue/throat swelling, SOB or lightheadedness with hypotension: No Has patient had a PCN reaction causing SEVERE RASH INVOLVING MUCUS MEMBRANES or SKIN NECROSIS  #  #  #  YES  #  #  #  Has patient had a PCN reaction that required hospitalization: Unk Has patient had a PCN reaction occurring within the last 10 years: Unk If all of the above answers are "NO", then may proceed with Cephalosporin use.   [name] Lisinopril Other (See Comments)    Felt like she had the flu. Was very sick!!!  . Adhesive [Tape] Rash and Other (See Comments)    bruising   Objective: There were no vitals filed for this visit.   General: Patient is awake, alert,  oriented x 3 and in no acute distress.  Dermatology: Skin is warm and dry bilateral with a continued healed ulceration  at right great toe. Minimal reactive callus to toe with no other acute signs of infection. All Nails are elongated and mycotic x10.    Vascular: Dorsalis Pedis pulse = 1/4 Bilateral,  Posterior Tibial pulse = 0/4 Bilateral,  Capillary Fill Time < 5 seconds  Neurologic: Protective sensation absent bilateral.  Musculosketal:  No pain with palpation to right toes, no pain with compression to calves bilateral. Asymptomatic bunion and hammertoe and pes planus bony deformities noted bilateral.  No results for input(s): GRAMSTAIN, LABORGA in the last 8760 hours.  Assessment and Plan:  Problem List Items Addressed This Visit    None    Visit Diagnoses    Pain due to onychomycosis of toenails of both feet    -  Primary   PAD (peripheral artery disease) (HCC)       Hammer toe, unspecified laterality         -Examined patient -Nails x 10 debrided using sterile nail nipper without incident and reduced reactive callus skin at the right great toe with no underlying opening noted using a sterile chisel blade at no additional charge. -Continue with toe -Cont with daily inspection of feet in setting of Diabetes with PAD -Continue with diabetic shoes -Patient to return in 9 weeks for routine foot care or sooner if problems arise.  Landis Martins, DPM

## 2018-05-20 DIAGNOSIS — D631 Anemia in chronic kidney disease: Secondary | ICD-10-CM | POA: Diagnosis not present

## 2018-05-20 DIAGNOSIS — N186 End stage renal disease: Secondary | ICD-10-CM | POA: Diagnosis not present

## 2018-05-20 DIAGNOSIS — N2581 Secondary hyperparathyroidism of renal origin: Secondary | ICD-10-CM | POA: Diagnosis not present

## 2018-05-20 DIAGNOSIS — E1129 Type 2 diabetes mellitus with other diabetic kidney complication: Secondary | ICD-10-CM | POA: Diagnosis not present

## 2018-05-22 DIAGNOSIS — E1129 Type 2 diabetes mellitus with other diabetic kidney complication: Secondary | ICD-10-CM | POA: Diagnosis not present

## 2018-05-22 DIAGNOSIS — N186 End stage renal disease: Secondary | ICD-10-CM | POA: Diagnosis not present

## 2018-05-22 DIAGNOSIS — D631 Anemia in chronic kidney disease: Secondary | ICD-10-CM | POA: Diagnosis not present

## 2018-05-22 DIAGNOSIS — N2581 Secondary hyperparathyroidism of renal origin: Secondary | ICD-10-CM | POA: Diagnosis not present

## 2018-05-25 DIAGNOSIS — D631 Anemia in chronic kidney disease: Secondary | ICD-10-CM | POA: Diagnosis not present

## 2018-05-25 DIAGNOSIS — E1129 Type 2 diabetes mellitus with other diabetic kidney complication: Secondary | ICD-10-CM | POA: Diagnosis not present

## 2018-05-25 DIAGNOSIS — N186 End stage renal disease: Secondary | ICD-10-CM | POA: Diagnosis not present

## 2018-05-25 DIAGNOSIS — N2581 Secondary hyperparathyroidism of renal origin: Secondary | ICD-10-CM | POA: Diagnosis not present

## 2018-05-27 DIAGNOSIS — N2581 Secondary hyperparathyroidism of renal origin: Secondary | ICD-10-CM | POA: Diagnosis not present

## 2018-05-27 DIAGNOSIS — N186 End stage renal disease: Secondary | ICD-10-CM | POA: Diagnosis not present

## 2018-05-27 DIAGNOSIS — E1129 Type 2 diabetes mellitus with other diabetic kidney complication: Secondary | ICD-10-CM | POA: Diagnosis not present

## 2018-05-27 DIAGNOSIS — D631 Anemia in chronic kidney disease: Secondary | ICD-10-CM | POA: Diagnosis not present

## 2018-05-29 ENCOUNTER — Other Ambulatory Visit: Payer: Self-pay | Admitting: Internal Medicine

## 2018-05-29 DIAGNOSIS — N2581 Secondary hyperparathyroidism of renal origin: Secondary | ICD-10-CM | POA: Diagnosis not present

## 2018-05-29 DIAGNOSIS — N186 End stage renal disease: Secondary | ICD-10-CM | POA: Diagnosis not present

## 2018-05-29 DIAGNOSIS — E039 Hypothyroidism, unspecified: Secondary | ICD-10-CM

## 2018-05-29 DIAGNOSIS — E1129 Type 2 diabetes mellitus with other diabetic kidney complication: Secondary | ICD-10-CM | POA: Diagnosis not present

## 2018-05-29 DIAGNOSIS — D631 Anemia in chronic kidney disease: Secondary | ICD-10-CM | POA: Diagnosis not present

## 2018-06-01 DIAGNOSIS — D631 Anemia in chronic kidney disease: Secondary | ICD-10-CM | POA: Diagnosis not present

## 2018-06-01 DIAGNOSIS — N186 End stage renal disease: Secondary | ICD-10-CM | POA: Diagnosis not present

## 2018-06-01 DIAGNOSIS — E1129 Type 2 diabetes mellitus with other diabetic kidney complication: Secondary | ICD-10-CM | POA: Diagnosis not present

## 2018-06-01 DIAGNOSIS — N2581 Secondary hyperparathyroidism of renal origin: Secondary | ICD-10-CM | POA: Diagnosis not present

## 2018-06-02 ENCOUNTER — Ambulatory Visit (HOSPITAL_COMMUNITY)
Admission: RE | Admit: 2018-06-02 | Discharge: 2018-06-02 | Disposition: A | Discharge status: Home / Self Care | Payer: Medicare Other | Source: Ambulatory Visit | Attending: Cardiovascular Disease | Admitting: Cardiovascular Disease

## 2018-06-02 DIAGNOSIS — I739 Peripheral vascular disease, unspecified: Secondary | ICD-10-CM | POA: Diagnosis not present

## 2018-06-03 DIAGNOSIS — N186 End stage renal disease: Secondary | ICD-10-CM | POA: Diagnosis not present

## 2018-06-03 DIAGNOSIS — E1129 Type 2 diabetes mellitus with other diabetic kidney complication: Secondary | ICD-10-CM | POA: Diagnosis not present

## 2018-06-03 DIAGNOSIS — D631 Anemia in chronic kidney disease: Secondary | ICD-10-CM | POA: Diagnosis not present

## 2018-06-03 DIAGNOSIS — N2581 Secondary hyperparathyroidism of renal origin: Secondary | ICD-10-CM | POA: Diagnosis not present

## 2018-06-05 DIAGNOSIS — E1129 Type 2 diabetes mellitus with other diabetic kidney complication: Secondary | ICD-10-CM | POA: Diagnosis not present

## 2018-06-05 DIAGNOSIS — N2581 Secondary hyperparathyroidism of renal origin: Secondary | ICD-10-CM | POA: Diagnosis not present

## 2018-06-05 DIAGNOSIS — N186 End stage renal disease: Secondary | ICD-10-CM | POA: Diagnosis not present

## 2018-06-05 DIAGNOSIS — D631 Anemia in chronic kidney disease: Secondary | ICD-10-CM | POA: Diagnosis not present

## 2018-06-07 ENCOUNTER — Other Ambulatory Visit: Payer: Self-pay | Admitting: Family Medicine

## 2018-06-07 DIAGNOSIS — I5042 Chronic combined systolic (congestive) and diastolic (congestive) heart failure: Secondary | ICD-10-CM

## 2018-06-07 DIAGNOSIS — I10 Essential (primary) hypertension: Secondary | ICD-10-CM

## 2018-06-08 DIAGNOSIS — N186 End stage renal disease: Secondary | ICD-10-CM | POA: Diagnosis not present

## 2018-06-08 DIAGNOSIS — D631 Anemia in chronic kidney disease: Secondary | ICD-10-CM | POA: Diagnosis not present

## 2018-06-08 DIAGNOSIS — N2581 Secondary hyperparathyroidism of renal origin: Secondary | ICD-10-CM | POA: Diagnosis not present

## 2018-06-08 DIAGNOSIS — E1129 Type 2 diabetes mellitus with other diabetic kidney complication: Secondary | ICD-10-CM | POA: Diagnosis not present

## 2018-06-09 ENCOUNTER — Telehealth: Payer: Self-pay | Admitting: *Deleted

## 2018-06-09 DIAGNOSIS — E113412 Type 2 diabetes mellitus with severe nonproliferative diabetic retinopathy with macular edema, left eye: Secondary | ICD-10-CM | POA: Diagnosis not present

## 2018-06-09 DIAGNOSIS — I739 Peripheral vascular disease, unspecified: Secondary | ICD-10-CM

## 2018-06-09 DIAGNOSIS — H359 Unspecified retinal disorder: Secondary | ICD-10-CM | POA: Diagnosis not present

## 2018-06-09 DIAGNOSIS — E113411 Type 2 diabetes mellitus with severe nonproliferative diabetic retinopathy with macular edema, right eye: Secondary | ICD-10-CM | POA: Diagnosis not present

## 2018-06-09 DIAGNOSIS — H3561 Retinal hemorrhage, right eye: Secondary | ICD-10-CM | POA: Diagnosis not present

## 2018-06-09 NOTE — Telephone Encounter (Signed)
-----   Message from Wellington Hampshire, MD sent at 06/08/2018  4:29 PM EST ----- Inform patient that right leg stents are patent with normal ABI.  Left ABI continues to be moderately reduced.  Repeat studies in 1 year.  Keep follow-up in March to discuss options for left leg.

## 2018-06-09 NOTE — Telephone Encounter (Signed)
Patient made aware of results and verbalized understanding. Repeat orders placed.    

## 2018-06-10 DIAGNOSIS — D631 Anemia in chronic kidney disease: Secondary | ICD-10-CM | POA: Diagnosis not present

## 2018-06-10 DIAGNOSIS — E1129 Type 2 diabetes mellitus with other diabetic kidney complication: Secondary | ICD-10-CM | POA: Diagnosis not present

## 2018-06-10 DIAGNOSIS — N186 End stage renal disease: Secondary | ICD-10-CM | POA: Diagnosis not present

## 2018-06-10 DIAGNOSIS — N2581 Secondary hyperparathyroidism of renal origin: Secondary | ICD-10-CM | POA: Diagnosis not present

## 2018-06-12 DIAGNOSIS — E1129 Type 2 diabetes mellitus with other diabetic kidney complication: Secondary | ICD-10-CM | POA: Diagnosis not present

## 2018-06-12 DIAGNOSIS — N186 End stage renal disease: Secondary | ICD-10-CM | POA: Diagnosis not present

## 2018-06-12 DIAGNOSIS — D631 Anemia in chronic kidney disease: Secondary | ICD-10-CM | POA: Diagnosis not present

## 2018-06-12 DIAGNOSIS — N2581 Secondary hyperparathyroidism of renal origin: Secondary | ICD-10-CM | POA: Diagnosis not present

## 2018-06-14 DIAGNOSIS — Z992 Dependence on renal dialysis: Secondary | ICD-10-CM | POA: Diagnosis not present

## 2018-06-14 DIAGNOSIS — N186 End stage renal disease: Secondary | ICD-10-CM | POA: Diagnosis not present

## 2018-06-14 DIAGNOSIS — I129 Hypertensive chronic kidney disease with stage 1 through stage 4 chronic kidney disease, or unspecified chronic kidney disease: Secondary | ICD-10-CM | POA: Diagnosis not present

## 2018-06-15 ENCOUNTER — Other Ambulatory Visit: Payer: Self-pay | Admitting: Cardiovascular Disease

## 2018-06-15 DIAGNOSIS — E1129 Type 2 diabetes mellitus with other diabetic kidney complication: Secondary | ICD-10-CM | POA: Diagnosis not present

## 2018-06-15 DIAGNOSIS — N2581 Secondary hyperparathyroidism of renal origin: Secondary | ICD-10-CM | POA: Diagnosis not present

## 2018-06-15 DIAGNOSIS — N186 End stage renal disease: Secondary | ICD-10-CM | POA: Diagnosis not present

## 2018-06-15 DIAGNOSIS — D631 Anemia in chronic kidney disease: Secondary | ICD-10-CM | POA: Diagnosis not present

## 2018-06-17 DIAGNOSIS — D631 Anemia in chronic kidney disease: Secondary | ICD-10-CM | POA: Diagnosis not present

## 2018-06-17 DIAGNOSIS — N186 End stage renal disease: Secondary | ICD-10-CM | POA: Diagnosis not present

## 2018-06-17 DIAGNOSIS — E1129 Type 2 diabetes mellitus with other diabetic kidney complication: Secondary | ICD-10-CM | POA: Diagnosis not present

## 2018-06-17 DIAGNOSIS — N2581 Secondary hyperparathyroidism of renal origin: Secondary | ICD-10-CM | POA: Diagnosis not present

## 2018-06-19 DIAGNOSIS — N2581 Secondary hyperparathyroidism of renal origin: Secondary | ICD-10-CM | POA: Diagnosis not present

## 2018-06-19 DIAGNOSIS — E1129 Type 2 diabetes mellitus with other diabetic kidney complication: Secondary | ICD-10-CM | POA: Diagnosis not present

## 2018-06-19 DIAGNOSIS — N186 End stage renal disease: Secondary | ICD-10-CM | POA: Diagnosis not present

## 2018-06-19 DIAGNOSIS — D631 Anemia in chronic kidney disease: Secondary | ICD-10-CM | POA: Diagnosis not present

## 2018-06-22 DIAGNOSIS — D631 Anemia in chronic kidney disease: Secondary | ICD-10-CM | POA: Diagnosis not present

## 2018-06-22 DIAGNOSIS — N186 End stage renal disease: Secondary | ICD-10-CM | POA: Diagnosis not present

## 2018-06-22 DIAGNOSIS — E1129 Type 2 diabetes mellitus with other diabetic kidney complication: Secondary | ICD-10-CM | POA: Diagnosis not present

## 2018-06-22 DIAGNOSIS — N2581 Secondary hyperparathyroidism of renal origin: Secondary | ICD-10-CM | POA: Diagnosis not present

## 2018-06-23 ENCOUNTER — Ambulatory Visit (INDEPENDENT_AMBULATORY_CARE_PROVIDER_SITE_OTHER): Payer: Medicare Other | Admitting: Cardiovascular Disease

## 2018-06-23 ENCOUNTER — Encounter: Payer: Self-pay | Admitting: Cardiovascular Disease

## 2018-06-23 VITALS — BP 108/70 | HR 88 | Ht 70.5 in | Wt 225.8 lb

## 2018-06-23 DIAGNOSIS — I2510 Atherosclerotic heart disease of native coronary artery without angina pectoris: Secondary | ICD-10-CM

## 2018-06-23 DIAGNOSIS — E785 Hyperlipidemia, unspecified: Secondary | ICD-10-CM

## 2018-06-23 DIAGNOSIS — I739 Peripheral vascular disease, unspecified: Secondary | ICD-10-CM

## 2018-06-23 NOTE — Patient Instructions (Signed)
Medication Instructions:  The current medical regimen is effective;  continue present plan and medications.  If you need a refill on your cardiac medications before your next appointment, please call your pharmacy.    Follow-Up: At [facility], you and your health needs are our priority.  As part of our continuing mission to provide you with exceptional heart care, we have created designated Provider Care Teams.  These Care Teams include your primary Cardiologist (physician) and Advanced Practice Providers (APPs -  Physician Assistants and Nurse Practitioners) who all work together to provide you with the care you need, when you need it. You will need a follow up appointment in 6 months.  Please call our office 2 months in advance to schedule this appointment.  You may see Dr.Arida or one of the following Advanced Practice Providers on your designated Care Team:   Kerin Ransom, PA-C Roby Lofts, [place] . Sande Rives, PA-C

## 2018-06-23 NOTE — Progress Notes (Signed)
Cardiology Office Note   Date:  06/23/2018   ID:  Brooke, Stafford March 23, 1956, MRN [id number]  PCP:  Charlott Rakes, MD  Cardiologist: Dr. Sallyanne Kuster Nephrologist: Dr. Jimmy Footman   Chief Complaint  Patient presents with  . Follow-up    pt denied chest pain      History of Present Illness: Brooke Stafford is a 63 y.o. female who is here today for a follow-up visit regarding peripheral arterial disease.  She has known history of coronary artery disease with previous CABG and stent placement, chronic systolic and diastolic heart failure, insulin-requiring diabetes mellitus, hypertension, hyperlipidemia and end-stage renal disease on hemodialysis on Monday Wednesday and Friday. She is known to have peripheral arterial disease with previous left SFA stent placement in 2012 and 2014.  She was seen in July 2018 for right great toe ulceration.  Angiography showed diffuse right proximal and mid SFA disease with short occlusion distally as well as short proximal right popliteal artery stenosis and three-vessel runoff below the knee. I performed successful drug-coated balloon angioplasty and 2 self-expanding stent placement to both the popliteal and SFA.  The left SFA stent was noted to be occluded by duplex. Most recent Doppler studies in February showed normal ABI with patent right SFA popliteal artery stent.  Left ABI was moderately decreased and stable from before.  She has been doing well with no recent chest pain, shortness of breath or palpitations.    No right leg claudication or ulceration.  She reports some discomfort in the left thigh area that radiates to the calf.  The pain is worse with certain movements and she does endorse some balance problems.  The pain is not consistent with exertion.  Past Medical History:  Diagnosis Date  . Anemia   . Anginal pain (Brooke Stafford)   . Anxiety   . Arthritis    "stiff fingers and knees" (08/04/2013), (12/12/2014)  . Asthma   . CAD (coronary  artery disease) 2002; 2015   CABG x 6 2002, cath 2011- med Rx stent DES VG-Diag  . CAD (coronary artery disease) of artery bypass graft; DES to VG-Diag 09/28/13 11/09/2013  . Cataract   . CHF (congestive heart failure) ([place])    "in 2002" (11/26/2012)  . Chronic bronchitis ([place])    "q year; in the winter"   . CKD (chronic kidney disease)    stage 4, followed by [facility]  . Coronary artery disease 2002   CABG x 6. Cath 5/11- med Rx  . Diabetic neuropathy (Brooke Stafford)   . GERD (gastroesophageal reflux disease)   . Gout    "right big toe"  . Headache    "~ q week" (08/04/2013); "~ twice/month" (12/12/2014)  . History of blood transfusion 2002   "when I had OHS"  . Hyperlipidemia   . Hypertension   . Hypothyroid    treated  . Migraines    "couple times/year" (08/04/2013), (12/12/2014)  . Myocardial infarction (Brooke Stafford) 2000; 2002; 2011  . Obesity (BMI 35.0-39.9 without comorbidity)   . Peripheral vascular disease ([place]) 12/12   LSFA PTA  . Pneumonia    "3 times I think" (12/12/2014)  . Stroke [facility])    " mini stroke"  . Type II diabetes mellitus ([place])     Past Surgical History:  Procedure Laterality Date  . ABDOMINAL AORTAGRAM N/A 04/05/2011   Procedure: ABDOMINAL AORTAGRAM;  Surgeon: Lorretta Harp, MD;  Location: [facility] CATH LAB;  Service: Cardiovascular;  Laterality: N/A;  . ABDOMINAL  AORTOGRAM N/A 10/30/2016   Procedure: Abdominal Aortogram;  Surgeon: Wellington Hampshire, MD;  Location: [place] CV LAB;  Service: Cardiovascular;  Laterality: N/A;  . APPENDECTOMY  1980  . AV FISTULA PLACEMENT Left 05/10/2016   Procedure: LEFT ARM ARTERIOVENOUS (AV) FISTULA CREATION;  Surgeon: Rosetta Posner, MD;  Location: [place];  Service: Vascular;  Laterality: Left;  . BASCILIC VEIN TRANSPOSITION Left 06/26/2016   Procedure: SECOND STAGE BASILIC VEIN TRANSPOSITION;  Surgeon: Rosetta Posner, MD;  Location: [place];  Service: Vascular;  Laterality: Left;  . BREAST CYST EXCISION Right 1970's  . CARDIAC  CATHETERIZATION  2002  . CARDIAC CATHETERIZATION N/A 12/12/2014   Procedure: Left Heart Cath and Cors/Grafts Angiography;  Surgeon: Peter M Martinique, MD;  Location: [place] CV LAB;  Service: Cardiovascular;  Laterality: N/A;  . CARDIAC CATHETERIZATION N/A 12/12/2014   Procedure: Coronary Stent Intervention;  Surgeon: Peter M Martinique, MD;  Location: [place] CV LAB;  Service: Cardiovascular;  Laterality: N/A;  . CATARACT EXTRACTION, BILATERAL    . CESAREAN SECTION  1978; 1980  . CHOLECYSTECTOMY  1982  . COLONOSCOPY WITH PROPOFOL N/A 11/03/2017   Procedure: COLONOSCOPY WITH PROPOFOL;  Surgeon: Doran Stabler, MD;  Location: [facility] ENDOSCOPY;  Service: Gastroenterology;  Laterality: N/A;  . CORONARY ANGIOPLASTY WITH STENT PLACEMENT  2004; 2012   "I have 2 stents" (08/04/2013)  . CORONARY ANGIOPLASTY WITH STENT PLACEMENT  09/28/13   PTCA/ DES Xience stent to VG-Diag   . CORONARY ARTERY BYPASS GRAFT  11/20/2000   x6 LIMA to distal LAD, svg to first diag, svg to ramus intermediate branch and swquential SVG to cir marginal branch, SVG to posterior descending coronary and sequential SVG to first right posterolateral branch  . eye injections    . INSERTION OF DIALYSIS CATHETER Right 05/10/2016   Procedure: INSERTION OF DIALYSIS CATHETER - Right Internal Jugular Placement;  Surgeon: Rosetta Posner, MD;  Location: [place];  Service: Vascular;  Laterality: Right;  . LEFT HEART CATHETERIZATION WITH CORONARY/GRAFT ANGIOGRAM N/A 09/28/2013   Procedure: LEFT HEART CATHETERIZATION WITH Beatrix Fetters;  Surgeon: Peter M Martinique, MD;  Location: [facility] CATH LAB;  Service: Cardiovascular;  Laterality: N/A;  . LOWER EXTREMITY ANGIOGRAM  12/01/2012   Procedure: LOWER EXTREMITY ANGIOGRAM;  Surgeon: Lorretta Harp, MD;  Location: [facility] CATH LAB;  Service: Cardiovascular;;  . LOWER EXTREMITY ANGIOGRAPHY Right 10/30/2016   Procedure: Lower Extremity Angiography;  Surgeon: Wellington Hampshire, MD;  Location: [place] CV LAB;   Service: Cardiovascular;  Laterality: Right;  . NM MYOCAR PERF WALL MOTION  08/27/2004   negative  . PERCUTANEOUS STENT INTERVENTION Left 12/01/2012   Procedure: PERCUTANEOUS STENT INTERVENTION;  Surgeon: Lorretta Harp, MD;  Location: [facility]  CATH LAB;  Service: Cardiovascular;  Laterality: Left;  Left SFA  . PERIPHERAL ARTERIAL STENT GRAFT Left    SFA/notes 04/07/2011 (11/30/2012)  . PERIPHERAL VASCULAR ATHERECTOMY Right 10/30/2016   Procedure: Peripheral Vascular Atherectomy;  Surgeon: Wellington Hampshire, MD;  Location: [place] CV LAB;  Service: Cardiovascular;  Laterality: Right;  cancel unable to  . PERIPHERAL VASCULAR INTERVENTION Right 10/30/2016   Procedure: Peripheral Vascular Intervention;  Surgeon: Wellington Hampshire, MD;  Location: [place] CV LAB;  Service: Cardiovascular;  Laterality: Right;  SFA  . POLYPECTOMY  11/03/2017   Procedure: POLYPECTOMY;  Surgeon: Doran Stabler, MD;  Location: [facility];  Service: Gastroenterology;;  . RENAL ANGIOGRAM N/A 04/05/2011   Procedure: RENAL ANGIOGRAM;  Surgeon: Pearletha Forge  Gwenlyn Found, MD;  Location: [facility] CATH LAB;  Service: Cardiovascular;  Laterality: N/A;  . TUBAL LIGATION  1980     Current Outpatient Medications  Medication Sig Dispense Refill  . allopurinol (ZYLOPRIM) 300 MG tablet TAKE 1 TABLET(300 MG) BY MOUTH DAILY 90 tablet 1  . aspirin 81 MG chewable tablet Chew 1 tablet (81 mg total) by mouth daily. 30 tablet 10  . atorvastatin (LIPITOR) 40 MG tablet TAKE 1 TABLET(40 MG) BY MOUTH EVERY EVENING 90 tablet 3  . AURYXIA 1 GM 210 MG(Fe) tablet TAKE 2 TABLETS BY MOUTH THREE TIMES A DAY WITH MEALS.   SWALLOW WHOLE, DO NOT CHEW OR CRUSH MEDICATION  1  . Blood Glucose Monitoring Suppl (ACCU-CHEK AVIVA CONNECT) w/Device KIT 16 Units by Does not apply route daily. 1 kit 0  . busPIRone (BUSPAR) 10 MG tablet Take 1 tablet (10 mg total) by mouth 3 (three) times daily. 90 tablet 6  . cetirizine (ZYRTEC) 10 MG tablet Take 10 mg by mouth daily.      . clopidogrel (PLAVIX) 75 MG tablet Take 1 tablet (75 mg total) by mouth daily. 90 tablet 1  . fluticasone (FLONASE) 50 MCG/ACT nasal spray Place 1 spray into both nostrils daily. (Patient taking differently: Place 2 sprays into both nostrils daily. ) 48 g 2  . gabapentin (NEURONTIN) 400 MG capsule Take 1 capsule (400 mg total) by mouth at bedtime. 30 capsule 6  . glucose blood (ACCU-CHEK ACTIVE STRIPS) test strip Use as instructed 100 each 12  . hydroxychloroquine (PLAQUENIL) 200 MG tablet TAKE 2 TABLETS BY MOUTH EVERY DAY WITH FOOD OR MILK  0  . hydrOXYzine (ATARAX/VISTARIL) 25 MG tablet Take 0.5 tablets (12.5 mg total) by mouth daily as needed. (Patient taking differently: Take 12.5 mg by mouth daily as needed for anxiety. ) 30 tablet 0  . insulin aspart (NOVOLOG FLEXPEN) 100 UNIT/ML FlexPen Inject 5-8 Units into the skin 3 (three) times daily with meals. 15 mL 11  . Insulin Glargine (BASAGLAR KWIKPEN) 100 UNIT/ML SOPN INJECT 10 UNITS INTO THE SKIN AT BEDTIME 45 mL 3  . Insulin Pen Needle 32G X 4 MM MISC Use 4x a day 200 each 3  . ipratropium (ATROVENT) 0.03 % nasal spray Place 2 sprays into both nostrils every 12 (twelve) hours. (Patient taking differently: Place 2 sprays into both nostrils 2 (two) times daily as needed for rhinitis. ) 30 mL 6  . isosorbide mononitrate (IMDUR) 60 MG 24 hr tablet TAKE 2 TABLETS(120 MG) BY MOUTH DAILY 60 tablet 2  . Lancets (ACCU-CHEK SOFT TOUCH) lancets Use as instructed 100 each 12  . levothyroxine (SYNTHROID, LEVOTHROID) 25 MCG tablet Take 1 tablet (25 mcg total) by mouth daily before breakfast. 30 tablet 2  . lidocaine-prilocaine (EMLA) cream Apply 1 application topically as needed (for port access).   4  . LINZESS 145 MCG CAPS capsule TAKE 1 CAPSULE(145 MCG) BY MOUTH DAILY BEFORE BREAKFAST 30 capsule 0  . metoCLOPramide (REGLAN) 5 MG tablet Take 1 tablet (5 mg total) by mouth every 8 (eight) hours as needed for nausea. 90 tablet 3  . metoprolol tartrate  (LOPRESSOR) 25 MG tablet TAKE 1/2 TABLET(12.5 MG) BY MOUTH TWICE DAILY 90 tablet 3  . Na Sulfate-K Sulfate-Mg Sulf 17.5-3.13-1.6 GM/177ML SOLN Suprep-Use as directed 354 mL 0  . nitroGLYCERIN (NITROSTAT) 0.4 MG SL tablet Place 1 tablet (0.4 mg total) under the tongue every 5 (five) minutes as needed. 30 tablet 2  . NOVOLOG FLEXPEN 100 UNIT/ML FlexPen  ADMINISTER 5 UNITS UNDER THE SKIN THREE TIMES DAILY WITH MEALS 15 mL 0  . ORENCIA CLICKJECT 638 MG/ML SOAJ     . RENVELA 800 MG tablet TAKE 3 TABLETS BY MOUTH THREE TIMES DAILY WITH MEALS  3  . sevelamer (RENAGEL) 800 MG tablet Take 2,400 mg by mouth 3 (three) times daily with meals.    . traMADol (ULTRAM) 50 MG tablet Take 1 tablet (50 mg total) by mouth every 12 (twelve) hours as needed. (Patient taking differently: Take 50 mg by mouth every 12 (twelve) hours as needed for moderate pain. ) 40 tablet 1  . triamcinolone cream (KENALOG) 0.1 % Apply 1 application topically 2 (two) times daily. 45 g 1   No current facility-administered medications for this visit.     Allergies:   Digoxin and related; Hydralazine; Penicillins cross reactors; Lisinopril; and Adhesive [tape]    Social History:  The patient  reports that she quit smoking about 18 years ago. Her smoking use included cigarettes. She smoked 0.00 packs per day for 25.00 years. She has never used smokeless tobacco. She reports that she does not drink alcohol or use drugs.   Family History:  The patient's family history includes Diabetes in her mother and sister; Hyperlipidemia in her sister; Hypertension in her brother, father, mother, and sister; Stroke in her mother.    ROS:  Please see the history of present illness.   Otherwise, review of systems are positive for none.   All other systems are reviewed and negative.    PHYSICAL EXAM: VS:  BP 108/70   Pulse 88   Ht 5' 10.5" (1.791 m)   Wt 225 lb 12.8 oz (102.4 kg)   LMP 10/13/2014 (Exact Date)   SpO2 96%   BMI 31.94 kg/m  , BMI  Body mass index is 31.94 kg/m. GEN: Well nourished, well developed, in no acute distress  HEENT: normal  Neck: no JVD, carotid bruits, or masses Cardiac: RRR; no murmurs, rubs, or gallops,no edema  Respiratory:  clear to auscultation bilaterally, normal work of breathing GI: soft, nontender, nondistended, + BS MS: no deformity or atrophy  Skin: warm and dry, no rash Neuro:  Strength and sensation are intact Psych: euthymic mood, full affect No lower extremity ulcerations.   Femoral pulses normal bilaterally.   EKG:  EKG is not ordered today.    Recent Labs: 11/03/2017: Hemoglobin 10.5; Potassium 4.1; Sodium 138 01/27/2018: TSH 2.500    Lipid Panel    Component Value Date/Time   CHOL 118 07/22/2017 1044   TRIG 160 (H) 07/22/2017 1044   HDL 43 07/22/2017 1044   CHOLHDL 2.7 07/22/2017 1044   CHOLHDL 3.6 07/25/2015 2037   VLDL 44 (H) 07/25/2015 2037   LDLCALC 43 07/22/2017 1044      Wt Readings from Last 3 Encounters:  06/23/18 225 lb 12.8 oz (102.4 kg)  05/12/18 221 lb 3.2 oz (100.3 kg)  03/31/18 223 lb 6.4 oz (101.3 kg)      No flowsheet data found.    ASSESSMENT AND PLAN:  1.  Peripheral arterial disease :  Status post successful endovascular intervention on the right SFA and popliteal artery in 2018.  Continue dual antiplatelet therapy for now as tolerated. She does have occluded stent in the left SFA .  ABI has been moderately decreased on the left and stable from before.  Her current left leg pain is not consistent with claudication and I do not think it is related to a vascular etiology.  Continue medical therapy.  2. End-stage renal disease on hemodialysis: stable.   3. Chronic systolic/diastolic heart failure: She appears to be euvolemic.  4. Coronary artery disease involving native coronary arteries without angina: Continue medical therapy.  5. Hyperlipidemia: Continue treatment with atorvastatin with a target LDL of less than 70.   Disposition:   FU  with me in 6 months  Signed,  Kathlyn Sacramento, MD  06/23/2018 10:42 AM    [place]

## 2018-06-24 DIAGNOSIS — N2581 Secondary hyperparathyroidism of renal origin: Secondary | ICD-10-CM | POA: Diagnosis not present

## 2018-06-24 DIAGNOSIS — E1129 Type 2 diabetes mellitus with other diabetic kidney complication: Secondary | ICD-10-CM | POA: Diagnosis not present

## 2018-06-24 DIAGNOSIS — D631 Anemia in chronic kidney disease: Secondary | ICD-10-CM | POA: Diagnosis not present

## 2018-06-24 DIAGNOSIS — N186 End stage renal disease: Secondary | ICD-10-CM | POA: Diagnosis not present

## 2018-06-26 DIAGNOSIS — D631 Anemia in chronic kidney disease: Secondary | ICD-10-CM | POA: Diagnosis not present

## 2018-06-26 DIAGNOSIS — N2581 Secondary hyperparathyroidism of renal origin: Secondary | ICD-10-CM | POA: Diagnosis not present

## 2018-06-26 DIAGNOSIS — N186 End stage renal disease: Secondary | ICD-10-CM | POA: Diagnosis not present

## 2018-06-26 DIAGNOSIS — E1129 Type 2 diabetes mellitus with other diabetic kidney complication: Secondary | ICD-10-CM | POA: Diagnosis not present

## 2018-06-29 DIAGNOSIS — E1129 Type 2 diabetes mellitus with other diabetic kidney complication: Secondary | ICD-10-CM | POA: Diagnosis not present

## 2018-06-29 DIAGNOSIS — N186 End stage renal disease: Secondary | ICD-10-CM | POA: Diagnosis not present

## 2018-06-29 DIAGNOSIS — D631 Anemia in chronic kidney disease: Secondary | ICD-10-CM | POA: Diagnosis not present

## 2018-06-29 DIAGNOSIS — N2581 Secondary hyperparathyroidism of renal origin: Secondary | ICD-10-CM | POA: Diagnosis not present

## 2018-07-01 DIAGNOSIS — D631 Anemia in chronic kidney disease: Secondary | ICD-10-CM | POA: Diagnosis not present

## 2018-07-01 DIAGNOSIS — N2581 Secondary hyperparathyroidism of renal origin: Secondary | ICD-10-CM | POA: Diagnosis not present

## 2018-07-01 DIAGNOSIS — N186 End stage renal disease: Secondary | ICD-10-CM | POA: Diagnosis not present

## 2018-07-01 DIAGNOSIS — E039 Hypothyroidism, unspecified: Secondary | ICD-10-CM | POA: Diagnosis not present

## 2018-07-01 DIAGNOSIS — E1129 Type 2 diabetes mellitus with other diabetic kidney complication: Secondary | ICD-10-CM | POA: Diagnosis not present

## 2018-07-03 DIAGNOSIS — E1129 Type 2 diabetes mellitus with other diabetic kidney complication: Secondary | ICD-10-CM | POA: Diagnosis not present

## 2018-07-03 DIAGNOSIS — N186 End stage renal disease: Secondary | ICD-10-CM | POA: Diagnosis not present

## 2018-07-03 DIAGNOSIS — N2581 Secondary hyperparathyroidism of renal origin: Secondary | ICD-10-CM | POA: Diagnosis not present

## 2018-07-03 DIAGNOSIS — D631 Anemia in chronic kidney disease: Secondary | ICD-10-CM | POA: Diagnosis not present

## 2018-07-06 DIAGNOSIS — E1129 Type 2 diabetes mellitus with other diabetic kidney complication: Secondary | ICD-10-CM | POA: Diagnosis not present

## 2018-07-06 DIAGNOSIS — D631 Anemia in chronic kidney disease: Secondary | ICD-10-CM | POA: Diagnosis not present

## 2018-07-06 DIAGNOSIS — N186 End stage renal disease: Secondary | ICD-10-CM | POA: Diagnosis not present

## 2018-07-06 DIAGNOSIS — N2581 Secondary hyperparathyroidism of renal origin: Secondary | ICD-10-CM | POA: Diagnosis not present

## 2018-07-08 DIAGNOSIS — N2581 Secondary hyperparathyroidism of renal origin: Secondary | ICD-10-CM | POA: Diagnosis not present

## 2018-07-08 DIAGNOSIS — E1129 Type 2 diabetes mellitus with other diabetic kidney complication: Secondary | ICD-10-CM | POA: Diagnosis not present

## 2018-07-08 DIAGNOSIS — N186 End stage renal disease: Secondary | ICD-10-CM | POA: Diagnosis not present

## 2018-07-08 DIAGNOSIS — D631 Anemia in chronic kidney disease: Secondary | ICD-10-CM | POA: Diagnosis not present

## 2018-07-10 DIAGNOSIS — N186 End stage renal disease: Secondary | ICD-10-CM | POA: Diagnosis not present

## 2018-07-10 DIAGNOSIS — N2581 Secondary hyperparathyroidism of renal origin: Secondary | ICD-10-CM | POA: Diagnosis not present

## 2018-07-10 DIAGNOSIS — E1129 Type 2 diabetes mellitus with other diabetic kidney complication: Secondary | ICD-10-CM | POA: Diagnosis not present

## 2018-07-10 DIAGNOSIS — D631 Anemia in chronic kidney disease: Secondary | ICD-10-CM | POA: Diagnosis not present

## 2018-07-13 DIAGNOSIS — N2581 Secondary hyperparathyroidism of renal origin: Secondary | ICD-10-CM | POA: Diagnosis not present

## 2018-07-13 DIAGNOSIS — D631 Anemia in chronic kidney disease: Secondary | ICD-10-CM | POA: Diagnosis not present

## 2018-07-13 DIAGNOSIS — N186 End stage renal disease: Secondary | ICD-10-CM | POA: Diagnosis not present

## 2018-07-13 DIAGNOSIS — E1129 Type 2 diabetes mellitus with other diabetic kidney complication: Secondary | ICD-10-CM | POA: Diagnosis not present

## 2018-07-15 DIAGNOSIS — N186 End stage renal disease: Secondary | ICD-10-CM | POA: Diagnosis not present

## 2018-07-15 DIAGNOSIS — Z992 Dependence on renal dialysis: Secondary | ICD-10-CM | POA: Diagnosis not present

## 2018-07-15 DIAGNOSIS — I129 Hypertensive chronic kidney disease with stage 1 through stage 4 chronic kidney disease, or unspecified chronic kidney disease: Secondary | ICD-10-CM | POA: Diagnosis not present

## 2018-07-15 DIAGNOSIS — E1129 Type 2 diabetes mellitus with other diabetic kidney complication: Secondary | ICD-10-CM | POA: Diagnosis not present

## 2018-07-15 DIAGNOSIS — N2581 Secondary hyperparathyroidism of renal origin: Secondary | ICD-10-CM | POA: Diagnosis not present

## 2018-07-15 DIAGNOSIS — D631 Anemia in chronic kidney disease: Secondary | ICD-10-CM | POA: Diagnosis not present

## 2018-07-17 DIAGNOSIS — E1129 Type 2 diabetes mellitus with other diabetic kidney complication: Secondary | ICD-10-CM | POA: Diagnosis not present

## 2018-07-17 DIAGNOSIS — D631 Anemia in chronic kidney disease: Secondary | ICD-10-CM | POA: Diagnosis not present

## 2018-07-17 DIAGNOSIS — N186 End stage renal disease: Secondary | ICD-10-CM | POA: Diagnosis not present

## 2018-07-17 DIAGNOSIS — N2581 Secondary hyperparathyroidism of renal origin: Secondary | ICD-10-CM | POA: Diagnosis not present

## 2018-07-20 DIAGNOSIS — D631 Anemia in chronic kidney disease: Secondary | ICD-10-CM | POA: Diagnosis not present

## 2018-07-20 DIAGNOSIS — N186 End stage renal disease: Secondary | ICD-10-CM | POA: Diagnosis not present

## 2018-07-20 DIAGNOSIS — N2581 Secondary hyperparathyroidism of renal origin: Secondary | ICD-10-CM | POA: Diagnosis not present

## 2018-07-20 DIAGNOSIS — E1129 Type 2 diabetes mellitus with other diabetic kidney complication: Secondary | ICD-10-CM | POA: Diagnosis not present

## 2018-07-21 ENCOUNTER — Ambulatory Visit (INDEPENDENT_AMBULATORY_CARE_PROVIDER_SITE_OTHER): Payer: Medicare Other | Admitting: Internal Medicine

## 2018-07-21 ENCOUNTER — Encounter: Payer: Self-pay | Admitting: Internal Medicine

## 2018-07-21 ENCOUNTER — Other Ambulatory Visit: Payer: Self-pay

## 2018-07-21 DIAGNOSIS — N186 End stage renal disease: Secondary | ICD-10-CM | POA: Diagnosis not present

## 2018-07-21 DIAGNOSIS — E1122 Type 2 diabetes mellitus with diabetic chronic kidney disease: Secondary | ICD-10-CM | POA: Diagnosis not present

## 2018-07-21 DIAGNOSIS — E039 Hypothyroidism, unspecified: Secondary | ICD-10-CM

## 2018-07-21 DIAGNOSIS — E785 Hyperlipidemia, unspecified: Secondary | ICD-10-CM

## 2018-07-21 NOTE — Patient Instructions (Addendum)
Please continue: - Basaglar 16 units at bedtime - Novolog   Before b'fast: 8 units   Before lunch: 5 units  Please increase:  Before dinner: 5-8 units  Please return in 6 months with your sugar log.
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
## 2018-07-22 DIAGNOSIS — E1129 Type 2 diabetes mellitus with other diabetic kidney complication: Secondary | ICD-10-CM | POA: Diagnosis not present

## 2018-07-22 DIAGNOSIS — N2581 Secondary hyperparathyroidism of renal origin: Secondary | ICD-10-CM | POA: Diagnosis not present

## 2018-07-22 DIAGNOSIS — D631 Anemia in chronic kidney disease: Secondary | ICD-10-CM | POA: Diagnosis not present

## 2018-07-22 DIAGNOSIS — N186 End stage renal disease: Secondary | ICD-10-CM | POA: Diagnosis not present

## 2018-07-24 DIAGNOSIS — D631 Anemia in chronic kidney disease: Secondary | ICD-10-CM | POA: Diagnosis not present

## 2018-07-24 DIAGNOSIS — N186 End stage renal disease: Secondary | ICD-10-CM | POA: Diagnosis not present

## 2018-07-24 DIAGNOSIS — N2581 Secondary hyperparathyroidism of renal origin: Secondary | ICD-10-CM | POA: Diagnosis not present

## 2018-07-24 DIAGNOSIS — E1129 Type 2 diabetes mellitus with other diabetic kidney complication: Secondary | ICD-10-CM | POA: Diagnosis not present

## 2018-07-27 DIAGNOSIS — D631 Anemia in chronic kidney disease: Secondary | ICD-10-CM | POA: Diagnosis not present

## 2018-07-27 DIAGNOSIS — N2581 Secondary hyperparathyroidism of renal origin: Secondary | ICD-10-CM | POA: Diagnosis not present

## 2018-07-27 DIAGNOSIS — E1129 Type 2 diabetes mellitus with other diabetic kidney complication: Secondary | ICD-10-CM | POA: Diagnosis not present

## 2018-07-27 DIAGNOSIS — N186 End stage renal disease: Secondary | ICD-10-CM | POA: Diagnosis not present

## 2018-07-28 ENCOUNTER — Encounter: Payer: Self-pay | Admitting: Sports Medicine

## 2018-07-28 ENCOUNTER — Other Ambulatory Visit: Payer: Self-pay

## 2018-07-28 ENCOUNTER — Ambulatory Visit (INDEPENDENT_AMBULATORY_CARE_PROVIDER_SITE_OTHER): Payer: Medicare Other | Admitting: Sports Medicine

## 2018-07-28 VITALS — Temp 97.3°F

## 2018-07-28 DIAGNOSIS — E1142 Type 2 diabetes mellitus with diabetic polyneuropathy: Secondary | ICD-10-CM

## 2018-07-28 DIAGNOSIS — B351 Tinea unguium: Secondary | ICD-10-CM | POA: Diagnosis not present

## 2018-07-28 DIAGNOSIS — M79675 Pain in left toe(s): Secondary | ICD-10-CM | POA: Diagnosis not present

## 2018-07-28 DIAGNOSIS — M79674 Pain in right toe(s): Secondary | ICD-10-CM

## 2018-07-28 DIAGNOSIS — M21619 Bunion of unspecified foot: Secondary | ICD-10-CM

## 2018-07-28 DIAGNOSIS — I739 Peripheral vascular disease, unspecified: Secondary | ICD-10-CM

## 2018-07-28 DIAGNOSIS — M2040 Other hammer toe(s) (acquired), unspecified foot: Secondary | ICD-10-CM

## 2018-07-28 NOTE — Progress Notes (Signed)
Subjective: Brooke Stafford is a 63 y.o. female patient seen in office for follow up nail and callus care.  Patient reports that she is having pain at her big toe and arch that is worse since using new diabetic shoes. Pain is occasional sharp shooting. Denies any new trauma or injury. Reports last vascular check up was good was 6 weeks ago. Patient has no other pedal complaints at this time.  FBS this AM was 110 and last A1c 5.8 like before.  Patient is also a dialysis patient and is on Plavix, follows with vascular as above. Denies any other acute issues.   Patient Active Problem List   Diagnosis Date Noted  . Heme positive stool   . Benign neoplasm of ascending colon   . Benign neoplasm of transverse colon   . Benign neoplasm of descending colon   . Hypercholesterolemia 06/18/2017  . Coronary artery disease of autologous vein bypass graft with stable angina pectoris ([place]) 06/18/2017  . Diabetic neuropathy ([place]) 04/17/2017  . GERD (gastroesophageal reflux disease) 04/17/2017  . Foot ulcer, right ([place]) 11/28/2016  . Cervical polyp 11/12/2016  . AV (arteriovenous fistula) ([place]) 06/17/2016  . Mixed hyperlipidemia 05/21/2016  . ESRD (end stage renal disease) ([place])   . Hypokalemia 05/03/2016  . COPD exacerbation ([place]) 05/03/2016  . COPD (chronic obstructive pulmonary disease) ([place]) 04/11/2016  . CAP (community acquired pneumonia) 03/26/2016  . Acute respiratory failure with hypoxia ([place]) 03/26/2016  . Acute on chronic combined systolic and diastolic CHF (congestive heart failure) ([place]) 03/26/2016  . Medication management 03/21/2016  . Anemia of chronic disease 12/27/2015  . Acute on chronic systolic CHF (congestive heart failure) ([place])   . Acute CHF ([place]) 12/18/2015  . Hypertensive heart and renal disease with CHF and ESRD ([place]) 12/18/2015  . Constipation 12/18/2015  . Anxiety 11/02/2015  . Posterior circulation stroke ([place]) 11/02/2015  . Abnormality of gait 10/23/2015  . Depression  09/28/2015  . Diabetic retinopathy ([place]) 09/13/2015  . Grief reaction 08/24/2015  . Creatinine elevation 07/25/2015  . Chronic combined systolic and diastolic congestive heart failure ([place]) 07/25/2015  . Rash of hands 02/23/2015  . Essential hypertension   . Angina pectoris ([place]) 12/12/2014  . Abnormal nuclear stress test   . Acute combined systolic and diastolic heart failure ([place]) 12/02/2014  . Seasonal allergies 08/11/2014  . Gout 08/11/2014  . Encounter for screening mammogram for breast cancer 05/16/2014  . Screening for colon cancer 05/16/2014  . Type 2 diabetes mellitus with ESRD (end-stage renal disease) ([place]) 01/27/2014  . Atopic eczema 01/27/2014  . Precordial pain, atypical, negative MI, Musculature Skeletal pain  11/08/2013  . CKD (chronic kidney disease) stage 5, GFR less than 15 ml/min (HCC) 11/08/2013  . Unstable angina ([place]) 09/28/2013  . Ischemic cardiomyopathy- new drop in EF 08/30/2013  . Chest pain 08/04/2013  . CAD -S/P PCI June 2015 and 12/14/14 02/01/2013  . Hypothyroidism, acquired 02/01/2013  . PVD, LSFA PTA 12/12 04/06/2011  . Hx of CABG x 6 2002 04/06/2011  . Dyslipidemia 04/06/2011   Current Outpatient Medications on File Prior to Visit  Medication Sig Dispense Refill  . allopurinol (ZYLOPRIM) 300 MG tablet TAKE 1 TABLET(300 MG) BY MOUTH DAILY 90 tablet 1  . aspirin 81 MG chewable tablet Chew 1 tablet (81 mg total) by mouth daily. 30 tablet 10  . atorvastatin (LIPITOR) 40 MG tablet TAKE 1 TABLET(40 MG) BY MOUTH EVERY EVENING 90 tablet 3  . AURYXIA 1 GM 210 MG(Fe) tablet TAKE 2  TABLETS BY MOUTH THREE TIMES A DAY WITH MEALS.   SWALLOW WHOLE, DO NOT CHEW OR CRUSH MEDICATION  1  . Blood Glucose Monitoring Suppl (ACCU-CHEK AVIVA CONNECT) w/Device KIT 16 Units by Does not apply route daily. 1 kit 0  . busPIRone (BUSPAR) 10 MG tablet Take 1 tablet (10 mg total) by mouth 3 (three) times daily. 90 tablet 6  . cetirizine (ZYRTEC) 10 MG tablet Take 10 mg by mouth  daily.    . clopidogrel (PLAVIX) 75 MG tablet Take 1 tablet (75 mg total) by mouth daily. 90 tablet 1  . fluticasone (FLONASE) 50 MCG/ACT nasal spray Place 1 spray into both nostrils daily. (Patient taking differently: Place 2 sprays into both nostrils daily. ) 48 g 2  . gabapentin (NEURONTIN) 400 MG capsule Take 1 capsule (400 mg total) by mouth at bedtime. 30 capsule 6  . glucose blood (ACCU-CHEK ACTIVE STRIPS) test strip Use as instructed 100 each 12  . hydroxychloroquine (PLAQUENIL) 200 MG tablet TAKE 2 TABLETS BY MOUTH EVERY DAY WITH FOOD OR MILK  0  . hydrOXYzine (ATARAX/VISTARIL) 25 MG tablet Take 0.5 tablets (12.5 mg total) by mouth daily as needed. (Patient taking differently: Take 12.5 mg by mouth daily as needed for anxiety. ) 30 tablet 0  . insulin aspart (NOVOLOG FLEXPEN) 100 UNIT/ML FlexPen Inject 5-8 Units into the skin 3 (three) times daily with meals. 15 mL 11  . Insulin Glargine (BASAGLAR KWIKPEN) 100 UNIT/ML SOPN INJECT 10 UNITS INTO THE SKIN AT BEDTIME 45 mL 3  . Insulin Pen Needle 32G X 4 MM MISC Use 4x a day 200 each 3  . ipratropium (ATROVENT) 0.03 % nasal spray Place 2 sprays into both nostrils every 12 (twelve) hours. (Patient taking differently: Place 2 sprays into both nostrils 2 (two) times daily as needed for rhinitis. ) 30 mL 6  . isosorbide mononitrate (IMDUR) 60 MG 24 hr tablet TAKE 2 TABLETS(120 MG) BY MOUTH DAILY 60 tablet 2  . Lancets (ACCU-CHEK SOFT TOUCH) lancets Use as instructed 100 each 12  . levothyroxine (SYNTHROID, LEVOTHROID) 25 MCG tablet Take 1 tablet (25 mcg total) by mouth daily before breakfast. 30 tablet 2  . lidocaine-prilocaine (EMLA) cream Apply 1 application topically as needed (for port access).   4  . LINZESS 145 MCG CAPS capsule TAKE 1 CAPSULE(145 MCG) BY MOUTH DAILY BEFORE BREAKFAST 30 capsule 0  . metoCLOPramide (REGLAN) 5 MG tablet Take 1 tablet (5 mg total) by mouth every 8 (eight) hours as needed for nausea. 90 tablet 3  . metoprolol  tartrate (LOPRESSOR) 25 MG tablet TAKE 1/2 TABLET(12.5 MG) BY MOUTH TWICE DAILY 90 tablet 3  . Na Sulfate-K Sulfate-Mg Sulf 17.5-3.13-1.6 GM/177ML SOLN Suprep-Use as directed 354 mL 0  . nitroGLYCERIN (NITROSTAT) 0.4 MG SL tablet Place 1 tablet (0.4 mg total) under the tongue every 5 (five) minutes as needed. 30 tablet 2  . NOVOLOG FLEXPEN 100 UNIT/ML FlexPen ADMINISTER 5 UNITS UNDER THE SKIN THREE TIMES DAILY WITH MEALS 15 mL 0  . ORENCIA CLICKJECT 962 MG/ML SOAJ     . RENVELA 800 MG tablet TAKE 3 TABLETS BY MOUTH THREE TIMES DAILY WITH MEALS  3  . sevelamer (RENAGEL) 800 MG tablet Take 2,400 mg by mouth 3 (three) times daily with meals.    . traMADol (ULTRAM) 50 MG tablet Take 1 tablet (50 mg total) by mouth every 12 (twelve) hours as needed. (Patient taking differently: Take 50 mg by mouth every 12 (twelve) hours as  needed for moderate pain. ) 40 tablet 1  . triamcinolone cream (KENALOG) 0.1 % Apply 1 application topically 2 (two) times daily. 45 g 1   No current facility-administered medications on file prior to visit.    Allergies  Allergen Reactions  . Digoxin And Related Diarrhea and Other (See Comments)    TOXIC DRUG LEVELS Patient stated she almost died. Had flu like symptoms as well as diarrhea.  . Hydralazine Shortness Of Breath  . Penicillins Cross Reactors Hives and Other (See Comments)    HIGH FEVER  Has patient had a PCN reaction causing immediate rash, facial/tongue/throat swelling, SOB or lightheadedness with hypotension: No Has patient had a PCN reaction causing SEVERE RASH INVOLVING MUCUS MEMBRANES or SKIN NECROSIS  #  #  #  YES  #  #  #  Has patient had a PCN reaction that required hospitalization: Unk Has patient had a PCN reaction occurring within the last 10 years: Unk If all of the above answers are "NO", then may proceed with Cephalosporin use.   [name] Lisinopril Other (See Comments)    Felt like she had the flu. Was very sick!!!  . Adhesive [Tape] Rash and Other  (See Comments)    bruising   Objective: There were no vitals filed for this visit.   General: Patient is awake, alert, oriented x 3 and in no acute distress.  Dermatology: Skin is warm and dry bilateral with a continued healed ulceration  at right great toe. Minimal reactive callus to toe with no other acute signs of infection. All Nails are elongated and mycotic x10.    Vascular: Dorsalis Pedis pulse = 1/4 Bilateral,  Posterior Tibial pulse = 0/4 Bilateral,  Capillary Fill Time < 5 seconds  Neurologic: Protective sensation absent bilateral.  Musculosketal:  No pain with palpation to right toes except some sharp pain on right and new arch pain since getting new shoes, no pain with compression to calves bilateral. Asymptomatic bunion and hammertoe and pes planus bony deformities noted bilateral.  No results for input(s): GRAMSTAIN, LABORGA in the last 8760 hours.  Assessment and Plan:  Problem List Items Addressed This Visit      Nervous and Auditory   Diabetic neuropathy ([place])    Other Visit Diagnoses    Pain due to onychomycosis of toenails of both feet    -  Primary   PAD (peripheral artery disease) (HCC)       Hammer toe, unspecified laterality       Bunion          -Examined patient -Nails x 10 debrided using sterile nail nipper without incident and reduced reactive callus skin at the right great toe with no underlying opening noted using a sterile chisel blade at no additional charge. Liliane Channel assessed her diabetic shoes and adjusted her arch to see if this will help with her arch and toe pain and if no better after this should talk with her PCP about increasing Gabapentin since pain is sharp possibly related to neuropathy -Cont with daily inspection of feet in setting of Diabetes with PAD -Patient to return in 10 weeks for routine foot care or sooner if problems arise.   Landis Martins, DPM

## 2018-07-29 DIAGNOSIS — N2581 Secondary hyperparathyroidism of renal origin: Secondary | ICD-10-CM | POA: Diagnosis not present

## 2018-07-29 DIAGNOSIS — E1129 Type 2 diabetes mellitus with other diabetic kidney complication: Secondary | ICD-10-CM | POA: Diagnosis not present

## 2018-07-29 DIAGNOSIS — N186 End stage renal disease: Secondary | ICD-10-CM | POA: Diagnosis not present

## 2018-07-29 DIAGNOSIS — D631 Anemia in chronic kidney disease: Secondary | ICD-10-CM | POA: Diagnosis not present

## 2018-07-31 DIAGNOSIS — D631 Anemia in chronic kidney disease: Secondary | ICD-10-CM | POA: Diagnosis not present

## 2018-07-31 DIAGNOSIS — E1129 Type 2 diabetes mellitus with other diabetic kidney complication: Secondary | ICD-10-CM | POA: Diagnosis not present

## 2018-07-31 DIAGNOSIS — N186 End stage renal disease: Secondary | ICD-10-CM | POA: Diagnosis not present

## 2018-07-31 DIAGNOSIS — N2581 Secondary hyperparathyroidism of renal origin: Secondary | ICD-10-CM | POA: Diagnosis not present

## 2018-08-03 ENCOUNTER — Other Ambulatory Visit: Payer: Self-pay | Admitting: *Deleted

## 2018-08-03 DIAGNOSIS — N2581 Secondary hyperparathyroidism of renal origin: Secondary | ICD-10-CM | POA: Diagnosis not present

## 2018-08-03 DIAGNOSIS — N186 End stage renal disease: Secondary | ICD-10-CM | POA: Diagnosis not present

## 2018-08-03 DIAGNOSIS — E1129 Type 2 diabetes mellitus with other diabetic kidney complication: Secondary | ICD-10-CM | POA: Diagnosis not present

## 2018-08-03 DIAGNOSIS — D631 Anemia in chronic kidney disease: Secondary | ICD-10-CM | POA: Diagnosis not present

## 2018-08-03 NOTE — Patient Outreach (Signed)
[facility]) Care Management  08/03/2018  Brooke Stafford 03-11-1956 [id number]   Telephone outreach screening call    Referral source: [employer]  Referral reason : High Risk ,Tier 5  Outreach call to patient , began to explain reason for the call, HIPAA verified , patient states that she is at hemodialysis at the present and is agreeable to return call on the next day.   Plan  Will plan return call on next business day.    Joylene Draft, RN, [place] Management Coordinator  [phone]- Mobile [phone]- Toll Free Main Office

## 2018-08-04 ENCOUNTER — Other Ambulatory Visit: Payer: Self-pay | Admitting: *Deleted

## 2018-08-04 NOTE — Patient Outreach (Addendum)
[facility]) Care Management  08/04/2018  Brooke Stafford [birth year] [id number]   Telephone outreach screening call attempt    Referral source: [employer]  Referral reason : High Risk ,Tier 5  Outreach call to patient no answer , able to leave a voicemail message for return call.   Patient returned call, explained purpose of the call, patient reports being familiar with [facility] care management from prior contact. Patient states that she has been doing much better over the last 2 years since starting on dialysis. She discussed no hospital admission in about  2 years.   Patient further discussed: Social Patient lives at home with a roommate , she is independent with daily care. She uses a cane in the home and rollator  walker outside the home.  Patient uses [facility] transportation to appointments and Hemodialysis  Conditions  ESRD Patient attends hemodialysis MWF, reports conditions improved after starting therapy. She attended session yesterday reports feeling pretty good today. Patient discussed being evaluated for kidney  transplant but recently received letter that she is not a candidate  due to using a walker having a fall in the last year. Patient reports using walker when away from home to help with balance..  Patient discussed having vascular disease and stent in leg taking plavix daily. Diabetes Patient reports that diabetes is under control recent A1c was 5.8% she monitors her blood sugar 2  To 3 times a day, reading this am was 112.  Hypertension  Patient reports improved since she started hemodialysis, is not on blood pressure medications unless her blood pressure is about 120/, she monitors it a home.  History of depression when initially starting on dialysis ,  reports being under control taking buspar daily has seen counselor in the past but denies need at this time.  Patient reports wearing a mask when she is away from home due to covid 19  recommendations.   Advanced Directive Patient does not have one in place, agreeable to receiving information.  Medications Patient denies cost concerns of medications , taking consistently daily .   Consent  Explained and offered [facility] care management services, patient states that she is managing well with chronic conditions, she is agreeable to telephonic health coach follow up call for support of her chronic conditions.   Plan  Will place health coach referral for disease management of chronic medical conditions that include Diabetes, Renal disease.  Will send successful outreach letter and Advanced directive packet with EMMI handout .   Joylene Draft, RN, [place] Management Coordinator  [phone]- Mobile [phone]- Toll Free Main Office

## 2018-08-04 NOTE — Addendum Note (Signed)
Addended by: Joylene Draft A on: 08/04/2018 04:04 PM   Modules accepted: Orders

## 2018-08-05 ENCOUNTER — Other Ambulatory Visit: Payer: Self-pay | Admitting: *Deleted

## 2018-08-05 DIAGNOSIS — E1129 Type 2 diabetes mellitus with other diabetic kidney complication: Secondary | ICD-10-CM | POA: Diagnosis not present

## 2018-08-05 DIAGNOSIS — N2581 Secondary hyperparathyroidism of renal origin: Secondary | ICD-10-CM | POA: Diagnosis not present

## 2018-08-05 DIAGNOSIS — D631 Anemia in chronic kidney disease: Secondary | ICD-10-CM | POA: Diagnosis not present

## 2018-08-05 DIAGNOSIS — N186 End stage renal disease: Secondary | ICD-10-CM | POA: Diagnosis not present

## 2018-08-07 ENCOUNTER — Ambulatory Visit: Payer: Self-pay | Admitting: *Deleted

## 2018-08-07 DIAGNOSIS — N2581 Secondary hyperparathyroidism of renal origin: Secondary | ICD-10-CM | POA: Diagnosis not present

## 2018-08-07 DIAGNOSIS — E1129 Type 2 diabetes mellitus with other diabetic kidney complication: Secondary | ICD-10-CM | POA: Diagnosis not present

## 2018-08-07 DIAGNOSIS — D631 Anemia in chronic kidney disease: Secondary | ICD-10-CM | POA: Diagnosis not present

## 2018-08-07 DIAGNOSIS — N186 End stage renal disease: Secondary | ICD-10-CM | POA: Diagnosis not present

## 2018-08-10 DIAGNOSIS — E1129 Type 2 diabetes mellitus with other diabetic kidney complication: Secondary | ICD-10-CM | POA: Diagnosis not present

## 2018-08-10 DIAGNOSIS — N186 End stage renal disease: Secondary | ICD-10-CM | POA: Diagnosis not present

## 2018-08-10 DIAGNOSIS — D631 Anemia in chronic kidney disease: Secondary | ICD-10-CM | POA: Diagnosis not present

## 2018-08-10 DIAGNOSIS — N2581 Secondary hyperparathyroidism of renal origin: Secondary | ICD-10-CM | POA: Diagnosis not present

## 2018-08-11 ENCOUNTER — Ambulatory Visit: Payer: Self-pay | Admitting: Family Medicine

## 2018-08-11 DIAGNOSIS — Z992 Dependence on renal dialysis: Secondary | ICD-10-CM | POA: Diagnosis not present

## 2018-08-11 DIAGNOSIS — I871 Compression of vein: Secondary | ICD-10-CM | POA: Diagnosis not present

## 2018-08-11 DIAGNOSIS — N186 End stage renal disease: Secondary | ICD-10-CM | POA: Diagnosis not present

## 2018-08-12 DIAGNOSIS — N186 End stage renal disease: Secondary | ICD-10-CM | POA: Diagnosis not present

## 2018-08-12 DIAGNOSIS — N2581 Secondary hyperparathyroidism of renal origin: Secondary | ICD-10-CM | POA: Diagnosis not present

## 2018-08-12 DIAGNOSIS — E1129 Type 2 diabetes mellitus with other diabetic kidney complication: Secondary | ICD-10-CM | POA: Diagnosis not present

## 2018-08-12 DIAGNOSIS — D631 Anemia in chronic kidney disease: Secondary | ICD-10-CM | POA: Diagnosis not present

## 2018-08-13 ENCOUNTER — Other Ambulatory Visit: Payer: Self-pay | Admitting: *Deleted

## 2018-08-14 DIAGNOSIS — N186 End stage renal disease: Secondary | ICD-10-CM | POA: Diagnosis not present

## 2018-08-14 DIAGNOSIS — E1129 Type 2 diabetes mellitus with other diabetic kidney complication: Secondary | ICD-10-CM | POA: Diagnosis not present

## 2018-08-14 DIAGNOSIS — I129 Hypertensive chronic kidney disease with stage 1 through stage 4 chronic kidney disease, or unspecified chronic kidney disease: Secondary | ICD-10-CM | POA: Diagnosis not present

## 2018-08-14 DIAGNOSIS — Z992 Dependence on renal dialysis: Secondary | ICD-10-CM | POA: Diagnosis not present

## 2018-08-14 DIAGNOSIS — N2581 Secondary hyperparathyroidism of renal origin: Secondary | ICD-10-CM | POA: Diagnosis not present

## 2018-08-14 DIAGNOSIS — D631 Anemia in chronic kidney disease: Secondary | ICD-10-CM | POA: Diagnosis not present

## 2018-08-17 DIAGNOSIS — D631 Anemia in chronic kidney disease: Secondary | ICD-10-CM | POA: Diagnosis not present

## 2018-08-17 DIAGNOSIS — N186 End stage renal disease: Secondary | ICD-10-CM | POA: Diagnosis not present

## 2018-08-17 DIAGNOSIS — N2581 Secondary hyperparathyroidism of renal origin: Secondary | ICD-10-CM | POA: Diagnosis not present

## 2018-08-17 DIAGNOSIS — E1129 Type 2 diabetes mellitus with other diabetic kidney complication: Secondary | ICD-10-CM | POA: Diagnosis not present

## 2018-08-18 ENCOUNTER — Ambulatory Visit: Payer: Medicare Other | Attending: Family Medicine | Admitting: Family Medicine

## 2018-08-18 ENCOUNTER — Encounter: Payer: Self-pay | Admitting: Family Medicine

## 2018-08-18 ENCOUNTER — Other Ambulatory Visit: Payer: Self-pay

## 2018-08-18 DIAGNOSIS — I2510 Atherosclerotic heart disease of native coronary artery without angina pectoris: Secondary | ICD-10-CM

## 2018-08-18 DIAGNOSIS — E039 Hypothyroidism, unspecified: Secondary | ICD-10-CM | POA: Diagnosis not present

## 2018-08-18 DIAGNOSIS — M109 Gout, unspecified: Secondary | ICD-10-CM | POA: Diagnosis not present

## 2018-08-18 DIAGNOSIS — F419 Anxiety disorder, unspecified: Secondary | ICD-10-CM

## 2018-08-18 DIAGNOSIS — R2689 Other abnormalities of gait and mobility: Secondary | ICD-10-CM | POA: Diagnosis not present

## 2018-08-18 DIAGNOSIS — M79604 Pain in right leg: Secondary | ICD-10-CM

## 2018-08-18 DIAGNOSIS — L910 Hypertrophic scar: Secondary | ICD-10-CM | POA: Diagnosis not present

## 2018-08-18 DIAGNOSIS — E1149 Type 2 diabetes mellitus with other diabetic neurological complication: Secondary | ICD-10-CM | POA: Diagnosis not present

## 2018-08-18 DIAGNOSIS — I10 Essential (primary) hypertension: Secondary | ICD-10-CM | POA: Diagnosis not present

## 2018-08-18 DIAGNOSIS — I739 Peripheral vascular disease, unspecified: Secondary | ICD-10-CM

## 2018-08-18 DIAGNOSIS — I5042 Chronic combined systolic (congestive) and diastolic (congestive) heart failure: Secondary | ICD-10-CM

## 2018-08-18 MED ORDER — CLOPIDOGREL BISULFATE 75 MG PO TABS: 75.0000 mg | ORAL_TABLET | Freq: Every day | ORAL | 1 refills | Status: AC

## 2018-08-18 MED ORDER — TRAMADOL HCL 50 MG PO TABS: 50.0000 mg | ORAL_TABLET | Freq: Two times a day (BID) | ORAL | 1 refills | Status: DC | PRN

## 2018-08-18 MED ORDER — ALLOPURINOL 300 MG PO TABS: ORAL_TABLET | ORAL | 1 refills | Status: DC

## 2018-08-18 MED ORDER — GABAPENTIN 400 MG PO CAPS: 400.0000 mg | ORAL_CAPSULE | Freq: Two times a day (BID) | ORAL | 1 refills | Status: AC

## 2018-08-18 MED ORDER — ISOSORBIDE MONONITRATE ER 60 MG PO TB24: ORAL_TABLET | ORAL | 1 refills | Status: AC

## 2018-08-18 MED ORDER — BUSPIRONE HCL 10 MG PO TABS: 10.0000 mg | ORAL_TABLET | Freq: Two times a day (BID) | ORAL | 1 refills | Status: DC

## 2018-08-18 NOTE — Progress Notes (Signed)
Virtual Visit via Telephone Note  I connected with Brooke Stafford, on 08/18/2018 at 3:05 PM by telephone due to the COVID-19 pandemic and verified that I am speaking with the correct person using two identifiers.   Consent: I discussed the limitations, risks, security and privacy concerns of performing an evaluation and management service by telephone and the availability of in person appointments. I also discussed with the patient that there may be a patient responsible charge related to this service. The patient expressed understanding and agreed to proceed.   Location of Patient: Home  Location of Provider: Clinic   Persons participating in Telemedicine visit: Brooke Stafford Dr. Felecia Shelling     History of Present Illness: Brooke Stafford is a 62 year old female with a history of type 2 diabetes mellitus (A1c 5.2), hypertension, CHF (2-D echo 35 to 40% from 2-D echo of 04/2016), CAD status post PCI ,End-stage renal disease (currently on hemodialysis on Mondays, Wednesdays and Fridays), peripheral vascular disease (status post left SFA stent placement in 2012 and 2014 and right SFA and popliteal artery angioplasty and stent placement), rheumatoid arthritis here for a follow-up visit. She currently takes Plaquenil for her rheumatoid arthritis and takes tramadol for intermittent joint pains.  She informs me today she had received a call from [facility] stating a kidney was available for transplant however she was rejected due to the fact that she uses a walker.  She is requesting PT/OT referral to regain strength in her legs. She ambulates with a walker due to balance issues, dizziness and pain in her legs from her peripheral arterial disease.  She lives with her roommate who checks in on her; her family lives outside of [place]. Her neuropathy is uncontrolled and she is having to take 400 mg of gabapentin twice daily. Diabetes mellitus is  managed by her Endocrinologist Dr. Cruzita Lederer with her last visit last month.  Fasting sugars have ranged from 91-108 and she denies hypoglycemia.  She has been compliant with her antihypertensive and metoprolol was discontinued due to hypotension.  She does have a keloid on her anterior chest wall which is tender and is requesting referral to dermatology as she previously received corticosteroid injections in the past.   Past Medical History:  Diagnosis Date  . Anemia   . Anginal pain ([place])   . Anxiety   . Arthritis    "stiff fingers and knees" (08/04/2013), (12/12/2014)  . Asthma   . CAD (coronary artery disease) 2002; 2015   CABG x 6 2002, cath 2011- med Rx stent DES VG-Diag  . CAD (coronary artery disease) of artery bypass graft; DES to VG-Diag 09/28/13 11/09/2013  . Cataract   . CHF (congestive heart failure) ([place])    "in 2002" (11/26/2012)  . Chronic bronchitis ([place])    "q year; in the winter"   . CKD (chronic kidney disease)    stage 4, followed by [facility]  . Coronary artery disease 2002   CABG x 6. Cath 5/11- med Rx  . Diabetic neuropathy ([place])   . GERD (gastroesophageal reflux disease)   . Gout    "right big toe"  . Headache    "~ q week" (08/04/2013); "~ twice/month" (12/12/2014)  . History of blood transfusion 2002   "when I had OHS"  . Hyperlipidemia   . Hypertension   . Hypothyroid    treated  . Migraines    "couple times/year" (08/04/2013), (12/12/2014)  . Myocardial infarction ([place]) 2000; 2002;  2011  . Obesity (BMI 35.0-39.9 without comorbidity)   . Peripheral vascular disease ([place]) 12/12   LSFA PTA  . Pneumonia    "3 times I think" (12/12/2014)  . Stroke [facility])    " mini stroke"  . Type II diabetes mellitus (HCC)    Allergies  Allergen Reactions  . Digoxin And Related Diarrhea and Other (See Comments)    TOXIC DRUG LEVELS Patient stated she almost died. Had flu like symptoms as well as diarrhea.  . Hydralazine Shortness Of Breath  . Penicillins [place] Hives and Other (See Comments)    HIGH FEVER  Has patient had a PCN reaction causing immediate rash, facial/tongue/throat swelling, SOB or lightheadedness with hypotension: No Has patient had a PCN reaction causing SEVERE RASH INVOLVING MUCUS MEMBRANES or SKIN NECROSIS  #  #  #  YES  #  #  #  Has patient had a PCN reaction that required hospitalization: Unk Has patient had a PCN reaction occurring within the last 10 years: Unk If all of the above answers are "NO", then may proceed with Cephalosporin use.   [name] Lisinopril Other (See Comments)    Felt like she had the flu. Was very sick!!!  . Adhesive [Tape] Rash and Other (See Comments)    bruising    Current Outpatient Medications on File Prior to Visit  Medication Sig Dispense Refill  . allopurinol (ZYLOPRIM) 300 MG tablet TAKE 1 TABLET(300 MG) BY MOUTH DAILY 90 tablet 1  . aspirin 81 MG chewable tablet Chew 1 tablet (81 mg total) by mouth daily. 30 tablet 10  . atorvastatin (LIPITOR) 40 MG tablet TAKE 1 TABLET(40 MG) BY MOUTH EVERY EVENING 90 tablet 3  . AURYXIA 1 GM 210 MG(Fe) tablet TAKE 2 TABLETS BY MOUTH THREE TIMES A DAY WITH MEALS.   SWALLOW WHOLE, DO NOT CHEW OR CRUSH MEDICATION  1  . Blood Glucose Monitoring Suppl (ACCU-CHEK AVIVA CONNECT) w/Device KIT 16 Units by Does not apply route daily. 1 kit 0  . busPIRone (BUSPAR) 10 MG tablet Take 1 tablet (10 mg total) by mouth 3 (three) times daily. 90 tablet 6  . cetirizine (ZYRTEC) 10 MG tablet Take 10 mg by mouth daily.    . clopidogrel (PLAVIX) 75 MG tablet Take 1 tablet (75 mg total) by mouth daily. 90 tablet 1  . fluticasone (FLONASE) 50 MCG/ACT nasal spray Place 1 spray into both nostrils daily. (Patient taking differently: Place 2 sprays into both nostrils daily. ) 48 g 2  . gabapentin (NEURONTIN) 400 MG capsule Take 1 capsule (400 mg total) by mouth at bedtime. 30 capsule 6  . glucose blood (ACCU-CHEK ACTIVE STRIPS) test strip Use as instructed 100 each 12  .  hydroxychloroquine (PLAQUENIL) 200 MG tablet TAKE 2 TABLETS BY MOUTH EVERY DAY WITH FOOD OR MILK  0  . hydrOXYzine (ATARAX/VISTARIL) 25 MG tablet Take 0.5 tablets (12.5 mg total) by mouth daily as needed. (Patient taking differently: Take 12.5 mg by mouth daily as needed for anxiety. ) 30 tablet 0  . insulin aspart (NOVOLOG FLEXPEN) 100 UNIT/ML FlexPen Inject 5-8 Units into the skin 3 (three) times daily with meals. 15 mL 11  . Insulin Glargine (BASAGLAR KWIKPEN) 100 UNIT/ML SOPN INJECT 10 UNITS INTO THE SKIN AT BEDTIME 45 mL 3  . Insulin Pen Needle 32G X 4 MM MISC Use 4x a day 200 each 3  . ipratropium (ATROVENT) 0.03 % nasal spray Place 2 sprays into both nostrils every 12 (twelve)  hours. (Patient taking differently: Place 2 sprays into both nostrils 2 (two) times daily as needed for rhinitis. ) 30 mL 6  . isosorbide mononitrate (IMDUR) 60 MG 24 hr tablet TAKE 2 TABLETS(120 MG) BY MOUTH DAILY 60 tablet 2  . Lancets (ACCU-CHEK SOFT TOUCH) lancets Use as instructed 100 each 12  . levothyroxine (SYNTHROID, LEVOTHROID) 25 MCG tablet Take 1 tablet (25 mcg total) by mouth daily before breakfast. 30 tablet 2  . lidocaine-prilocaine (EMLA) cream Apply 1 application topically as needed (for port access).   4  . LINZESS 145 MCG CAPS capsule TAKE 1 CAPSULE(145 MCG) BY MOUTH DAILY BEFORE BREAKFAST 30 capsule 0  . metoCLOPramide (REGLAN) 5 MG tablet Take 1 tablet (5 mg total) by mouth every 8 (eight) hours as needed for nausea. 90 tablet 3  . Na Sulfate-K Sulfate-Mg Sulf 17.5-3.13-1.6 GM/177ML SOLN Suprep-Use as directed 354 mL 0  . nitroGLYCERIN (NITROSTAT) 0.4 MG SL tablet Place 1 tablet (0.4 mg total) under the tongue every 5 (five) minutes as needed. 30 tablet 2  . NOVOLOG FLEXPEN 100 UNIT/ML FlexPen ADMINISTER 5 UNITS UNDER THE SKIN THREE TIMES DAILY WITH MEALS 15 mL 0  . ORENCIA CLICKJECT 468 MG/ML SOAJ     . RENVELA 800 MG tablet TAKE 3 TABLETS BY MOUTH THREE TIMES DAILY WITH MEALS  3  . sevelamer  (RENAGEL) 800 MG tablet Take 2,400 mg by mouth 3 (three) times daily with meals.    . traMADol (ULTRAM) 50 MG tablet Take 1 tablet (50 mg total) by mouth every 12 (twelve) hours as needed. (Patient taking differently: Take 50 mg by mouth every 12 (twelve) hours as needed for moderate pain. ) 40 tablet 1  . triamcinolone cream (KENALOG) 0.1 % Apply 1 application topically 2 (two) times daily. 45 g 1  . metoprolol tartrate (LOPRESSOR) 25 MG tablet TAKE 1/2 TABLET(12.5 MG) BY MOUTH TWICE DAILY (Patient not taking: Reported on 08/18/2018) 90 tablet 3   No current facility-administered medications on file prior to visit.     Observations/Objective: Awake, alert, oriented x3 Not in acute distress  Assessment and Plan: 1. Gout of big toe Stable - allopurinol (ZYLOPRIM) 300 MG tablet; TAKE 1 TABLET(300 MG) BY MOUTH DAILY  Dispense: 90 tablet; Refill: 1  2. Anxiety Controlled - busPIRone (BUSPAR) 10 MG tablet; Take 1 tablet (10 mg total) by mouth 2 (two) times daily.  Dispense: 180 tablet; Refill: 1  3. PVD, LSFA PTA 12/12 Uncontrolled with persisting leg pains - clopidogrel (PLAVIX) 75 MG tablet; Take 1 tablet (75 mg total) by mouth daily.  Dispense: 90 tablet; Refill: 1  4. Other diabetic neurological complication associated with type 2 diabetes mellitus (HCC) Uncontrolled Increase dose of gabapentin - gabapentin (NEURONTIN) 400 MG capsule; Take 1 capsule (400 mg total) by mouth 2 (two) times daily.  Dispense: 180 capsule; Refill: 1 - CMP14+EGFR; Future - Lipid panel; Future  5. Essential hypertension, benign Controlled Counseled on blood pressure goal of less than 130/80, low-sodium, DASH diet, medication compliance, 150 minutes of moderate intensity exercise per week. Discussed medication compliance, adverse effects. - isosorbide mononitrate (IMDUR) 60 MG 24 hr tablet; TAKE 2 TABLETS(120 MG) BY MOUTH DAILY  Dispense: 180 tablet; Refill: 1  6. Chronic combined systolic and diastolic  congestive heart failure (HCC) Euvolemic - isosorbide mononitrate (IMDUR) 60 MG 24 hr tablet; TAKE 2 TABLETS(120 MG) BY MOUTH DAILY  Dispense: 180 tablet; Refill: 1  7. Hypothyroidism, acquired Last TSH was normal We will send of thyroid  panel and adjust regimen accordingly - T4, free; Future - TSH; Future  8. Pain of right lower extremity Secondary to diabetic neuropathy and bilateral PAD She ambulates with a walker and will need gait training for proper balance - Ambulatory referral to [facility] - traMADol (ULTRAM) 50 MG tablet; Take 1 tablet (50 mg total) by mouth every 12 (twelve) hours as needed for moderate pain.  Dispense: 40 tablet; Refill: 1  9. Balance problems See #8 above - Ambulatory referral to Home Health  10.  Keloid scar Refer to dermatology  Follow Up Instructions: Return in about 3 months (around 11/18/2018).    I discussed the assessment and treatment plan with the patient. The patient was provided an opportunity to ask questions and all were answered. The patient agreed with the plan and demonstrated an understanding of the instructions.   The patient was advised to call back or seek an in-person evaluation if the symptoms worsen or if the condition fails to improve as anticipated.     I provided 26 minutes total of non-face-to-face time during this encounter including median intraservice time, reviewing previous notes, labs, imaging, medications and explaining diagnosis and management.     Charlott Rakes, MD, FAAFP. [facility] and [place], [place]   08/18/2018, 3:05 PM

## 2018-08-18 NOTE — Progress Notes (Signed)
Patient has been called and DOB has been verified. Patient has been screened and transferred to PCP to start phone visit.     

## 2018-08-18 NOTE — Patient Outreach (Addendum)
[facility]) Care Management  08/13/2018 Late Entry  Brooke Stafford 1955-05-22 [id number]  RN Health Coach telephone call to patient.  [employer] compliance verified. Per patient she is doing fair. Patient A1C is 5.8. Fasting blood sugar is 113. Patient is using the [employer] bus for transportation. Patient was on the list for a kidney transplant but was told that she was not a candidate because she used a walker. Patient is considering some physical therapy to help more with balance. She has not had any recent falls and stated that she only uses the walker when she goes out. It helps with her balance and carrying items since a rollator walker. Per patient she goes to Dialysis M-W-F. Patient has agreed to further outreach calls  Current Medications:  Current Outpatient Medications  Medication Sig Dispense Refill  . allopurinol (ZYLOPRIM) 300 MG tablet TAKE 1 TABLET(300 MG) BY MOUTH DAILY 90 tablet 1  . aspirin 81 MG chewable tablet Chew 1 tablet (81 mg total) by mouth daily. 30 tablet 10  . atorvastatin (LIPITOR) 40 MG tablet TAKE 1 TABLET(40 MG) BY MOUTH EVERY EVENING 90 tablet 3  . AURYXIA 1 GM 210 MG(Fe) tablet TAKE 2 TABLETS BY MOUTH THREE TIMES A DAY WITH MEALS.   SWALLOW WHOLE, DO NOT CHEW OR CRUSH MEDICATION  1  . Blood Glucose Monitoring Suppl (ACCU-CHEK AVIVA CONNECT) w/Device KIT 16 Units by Does not apply route daily. 1 kit 0  . busPIRone (BUSPAR) 10 MG tablet Take 1 tablet (10 mg total) by mouth 3 (three) times daily. 90 tablet 6  . cetirizine (ZYRTEC) 10 MG tablet Take 10 mg by mouth daily.    . clopidogrel (PLAVIX) 75 MG tablet Take 1 tablet (75 mg total) by mouth daily. 90 tablet 1  . fluticasone (FLONASE) 50 MCG/ACT nasal spray Place 1 spray into both nostrils daily. (Patient taking differently: Place 2 sprays into both nostrils daily. ) 48 g 2  . gabapentin (NEURONTIN) 400 MG capsule Take 1 capsule (400 mg total) by mouth at bedtime. 30 capsule 6  . glucose blood  (ACCU-CHEK ACTIVE STRIPS) test strip Use as instructed 100 each 12  . hydroxychloroquine (PLAQUENIL) 200 MG tablet TAKE 2 TABLETS BY MOUTH EVERY DAY WITH FOOD OR MILK  0  . hydrOXYzine (ATARAX/VISTARIL) 25 MG tablet Take 0.5 tablets (12.5 mg total) by mouth daily as needed. (Patient taking differently: Take 12.5 mg by mouth daily as needed for anxiety. ) 30 tablet 0  . insulin aspart (NOVOLOG FLEXPEN) 100 UNIT/ML FlexPen Inject 5-8 Units into the skin 3 (three) times daily with meals. 15 mL 11  . Insulin Glargine (BASAGLAR KWIKPEN) 100 UNIT/ML SOPN INJECT 10 UNITS INTO THE SKIN AT BEDTIME 45 mL 3  . Insulin Pen Needle 32G X 4 MM MISC Use 4x a day 200 each 3  . ipratropium (ATROVENT) 0.03 % nasal spray Place 2 sprays into both nostrils every 12 (twelve) hours. (Patient taking differently: Place 2 sprays into both nostrils 2 (two) times daily as needed for rhinitis. ) 30 mL 6  . isosorbide mononitrate (IMDUR) 60 MG 24 hr tablet TAKE 2 TABLETS(120 MG) BY MOUTH DAILY 60 tablet 2  . Lancets (ACCU-CHEK SOFT TOUCH) lancets Use as instructed 100 each 12  . levothyroxine (SYNTHROID, LEVOTHROID) 25 MCG tablet Take 1 tablet (25 mcg total) by mouth daily before breakfast. 30 tablet 2  . lidocaine-prilocaine (EMLA) cream Apply 1 application topically as needed (for port access).   4  . Rolan Lipa  145 MCG CAPS capsule TAKE 1 CAPSULE(145 MCG) BY MOUTH DAILY BEFORE BREAKFAST 30 capsule 0  . metoCLOPramide (REGLAN) 5 MG tablet Take 1 tablet (5 mg total) by mouth every 8 (eight) hours as needed for nausea. 90 tablet 3  . metoprolol tartrate (LOPRESSOR) 25 MG tablet TAKE 1/2 TABLET(12.5 MG) BY MOUTH TWICE DAILY 90 tablet 3  . Na Sulfate-K Sulfate-Mg Sulf 17.5-3.13-1.6 GM/177ML SOLN Suprep-Use as directed 354 mL 0  . nitroGLYCERIN (NITROSTAT) 0.4 MG SL tablet Place 1 tablet (0.4 mg total) under the tongue every 5 (five) minutes as needed. 30 tablet 2  . NOVOLOG FLEXPEN 100 UNIT/ML FlexPen ADMINISTER 5 UNITS UNDER THE SKIN  THREE TIMES DAILY WITH MEALS 15 mL 0  . ORENCIA CLICKJECT 786 MG/ML SOAJ     . RENVELA 800 MG tablet TAKE 3 TABLETS BY MOUTH THREE TIMES DAILY WITH MEALS  3  . sevelamer (RENAGEL) 800 MG tablet Take 2,400 mg by mouth 3 (three) times daily with meals.    . traMADol (ULTRAM) 50 MG tablet Take 1 tablet (50 mg total) by mouth every 12 (twelve) hours as needed. (Patient taking differently: Take 50 mg by mouth every 12 (twelve) hours as needed for moderate pain. ) 40 tablet 1  . triamcinolone cream (KENALOG) 0.1 % Apply 1 application topically 2 (two) times daily. 45 g 1   No current facility-administered medications for this visit.     Functional Status:  In your present state of health, do you have any difficulty performing the following activities: 08/13/2018  Hearing? N  Vision? N  Difficulty concentrating or making decisions? N  Walking or climbing stairs? Y  Comment uses a rollator walker  Dressing or bathing? N  Doing errands, shopping? Y  Comment uses [employer] and eating ? N  Using the Toilet? N  In the past six months, have you accidently leaked urine? N  Do you have problems with loss of bowel control? N  Managing your Medications? N  Managing your Finances? N  Housekeeping or managing your Housekeeping? N  Some recent data might be hidden    Fall/Depression Screening: Fall Risk  08/13/2018 05/12/2018 01/27/2018  Falls in the past year? 0 0 No  Number falls in past yr: - - -  Injury with Fall? - - -  Risk Factor Category  - - -  Risk for fall due to : - - -  Follow up - - -   PHQ 2/9 Scores 08/13/2018 08/04/2018 05/12/2018 01/27/2018 11/27/2017 10/21/2017 10/02/2017  PHQ - 2 Score _0 0 0 2 2  PHQ- 9 Score - - 9 0 _1 THN CM Care Plan Problem One     Most Recent Value  Care Plan Problem One  Knowledge Deficit in Self Management of Diabetes  Role Documenting the Problem One  [place] for Problem One  Active  THN CM Short Term  Goal #1   Patient will follow up health maintenance within the next 90 days  THN CM Short Term Goal #1 Start Date  08/18/18  Interventions for Short Term Goal #1  Patient will reschedule all appointments that have been cancelled due to the cornavirus. RN will follow up for compliance  THN CM Short Term Goal #2   Patient will document blood sugars and appointments within the next 30 days  THN CM Short Term Goal #2 Start Date  08/18/18  Interventions for Short Term Goal #  2  RN discussed keeping a record of blood sugars and appointments. RN sent a 2020 Calendar book. RN will follow up with further discussion     Assessment:  A1C 5.8 FBS 118 Kidney transplant has been on hold Patient will benefit from [facility] telephonic outreach for education and support for diabetes self management.  Plan: RN discussed medication adherence RN discussed follow up appointment RN sent [phone] Calendar book RN sent living well with Diabetes Patient has adequate food supply during pandemic Patient has medication during pandemic Barriers and assessment sent to PCP RN will follow up within the month of July  Farrel Guimond [facility] [place] [facility] [phone]

## 2018-08-19 ENCOUNTER — Telehealth: Payer: Self-pay

## 2018-08-19 DIAGNOSIS — N186 End stage renal disease: Secondary | ICD-10-CM | POA: Diagnosis not present

## 2018-08-19 DIAGNOSIS — D631 Anemia in chronic kidney disease: Secondary | ICD-10-CM | POA: Diagnosis not present

## 2018-08-19 DIAGNOSIS — E1129 Type 2 diabetes mellitus with other diabetic kidney complication: Secondary | ICD-10-CM | POA: Diagnosis not present

## 2018-08-19 DIAGNOSIS — N2581 Secondary hyperparathyroidism of renal origin: Secondary | ICD-10-CM | POA: Diagnosis not present

## 2018-08-19 NOTE — Telephone Encounter (Signed)
Call placed to patient to inquire if she has a preference for home health agencies.  She reported no preference.   Call placed to [facility], spoke to [place] who stated that they could accept the referral.  Information faxed to # [phone].

## 2018-08-20 ENCOUNTER — Ambulatory Visit: Payer: Medicare Other | Attending: Family Medicine

## 2018-08-20 ENCOUNTER — Other Ambulatory Visit: Payer: Self-pay

## 2018-08-20 ENCOUNTER — Telehealth: Payer: Self-pay

## 2018-08-20 DIAGNOSIS — E1149 Type 2 diabetes mellitus with other diabetic neurological complication: Secondary | ICD-10-CM

## 2018-08-20 DIAGNOSIS — E039 Hypothyroidism, unspecified: Secondary | ICD-10-CM

## 2018-08-20 NOTE — Telephone Encounter (Signed)
Call placed to [facility].  Spoke to [place] who confirmed that they will be able to provide services for patient.

## 2018-08-21 ENCOUNTER — Other Ambulatory Visit: Payer: Self-pay | Admitting: Family Medicine

## 2018-08-21 DIAGNOSIS — N2581 Secondary hyperparathyroidism of renal origin: Secondary | ICD-10-CM | POA: Diagnosis not present

## 2018-08-21 DIAGNOSIS — D631 Anemia in chronic kidney disease: Secondary | ICD-10-CM | POA: Diagnosis not present

## 2018-08-21 DIAGNOSIS — E039 Hypothyroidism, unspecified: Secondary | ICD-10-CM

## 2018-08-21 DIAGNOSIS — E1129 Type 2 diabetes mellitus with other diabetic kidney complication: Secondary | ICD-10-CM | POA: Diagnosis not present

## 2018-08-21 DIAGNOSIS — N186 End stage renal disease: Secondary | ICD-10-CM | POA: Diagnosis not present

## 2018-08-21 LAB — LIPID PANEL
Chol/HDL Ratio: 2.2 ratio (ref 0.0–4.4)
Cholesterol, Total: 112 mg/dL (ref 100–199)
HDL: 52 mg/dL (ref >39)
LDL Calculated: 37 mg/dL (ref 0–99)
Triglycerides: 117 mg/dL (ref 0–149)
VLDL Cholesterol Cal: 23 mg/dL (ref 5–40)

## 2018-08-21 LAB — CMP14+EGFR
ALT: 17 IU/L (ref 0–32)
AST: 20 IU/L (ref 0–40)
Albumin/Globulin Ratio: 1.6 (ref 1.2–2.2)
Albumin: 4.8 g/dL (ref 3.8–4.8)
Alkaline Phosphatase: 93 IU/L (ref 39–117)
BUN/Creatinine Ratio: 3 — ABNORMAL LOW (ref 12–28)
BUN: 19 mg/dL (ref 8–27)
Bilirubin Total: 0.4 mg/dL (ref 0.0–1.2)
CO2: 27 mmol/L (ref 20–29)
Calcium: 9.7 mg/dL (ref 8.7–10.3)
Chloride: 92 mmol/L — ABNORMAL LOW (ref 96–106)
Creatinine, Ser: 7.15 mg/dL — ABNORMAL HIGH (ref 0.57–1.00)
GFR calc Af Amer: 6 mL/min/{1.73_m2} — ABNORMAL LOW (ref >59)
GFR calc non Af Amer: 6 mL/min/{1.73_m2} — ABNORMAL LOW (ref >59)
Globulin, Total: 3 g/dL (ref 1.5–4.5)
Glucose: 136 mg/dL — ABNORMAL HIGH (ref 65–99)
Potassium: 4.1 mmol/L (ref 3.5–5.2)
Sodium: 141 mmol/L (ref 134–144)
Total Protein: 7.8 g/dL (ref 6.0–8.5)

## 2018-08-21 LAB — T4, FREE: Free T4: 1.16 ng/dL (ref 0.82–1.77)

## 2018-08-21 LAB — TSH: TSH: 2.26 u[IU]/mL (ref 0.450–4.500)

## 2018-08-21 MED ORDER — LEVOTHYROXINE SODIUM 25 MCG PO TABS: 25.0000 ug | ORAL_TABLET | Freq: Every day | ORAL | 1 refills | Status: DC

## 2018-08-24 ENCOUNTER — Telehealth: Payer: Self-pay | Admitting: Family Medicine

## 2018-08-24 DIAGNOSIS — N186 End stage renal disease: Secondary | ICD-10-CM | POA: Diagnosis not present

## 2018-08-24 DIAGNOSIS — D631 Anemia in chronic kidney disease: Secondary | ICD-10-CM | POA: Diagnosis not present

## 2018-08-24 DIAGNOSIS — E1129 Type 2 diabetes mellitus with other diabetic kidney complication: Secondary | ICD-10-CM | POA: Diagnosis not present

## 2018-08-24 DIAGNOSIS — N2581 Secondary hyperparathyroidism of renal origin: Secondary | ICD-10-CM | POA: Diagnosis not present

## 2018-08-24 MED ORDER — CIPROFLOXACIN HCL 500 MG PO TABS: 500.0000 mg | ORAL_TABLET | Freq: Every day | ORAL | 0 refills | Status: DC

## 2018-08-24 NOTE — Telephone Encounter (Signed)
New Message   Pt states she has a kidney infection and wants to know if she can have a prescription called in to [facility] on [address]

## 2018-08-24 NOTE — Telephone Encounter (Signed)
I saw her recently for a tele-visit.  I have prescribed an antibiotic for her.

## 2018-08-24 NOTE — Telephone Encounter (Signed)
Does patient need a televisit to discuss this matter?

## 2018-08-25 DIAGNOSIS — E039 Hypothyroidism, unspecified: Secondary | ICD-10-CM | POA: Diagnosis not present

## 2018-08-25 DIAGNOSIS — E1151 Type 2 diabetes mellitus with diabetic peripheral angiopathy without gangrene: Secondary | ICD-10-CM | POA: Diagnosis not present

## 2018-08-25 DIAGNOSIS — N186 End stage renal disease: Secondary | ICD-10-CM | POA: Diagnosis not present

## 2018-08-25 DIAGNOSIS — E669 Obesity, unspecified: Secondary | ICD-10-CM | POA: Diagnosis not present

## 2018-08-25 DIAGNOSIS — Z6830 Body mass index (BMI) 30.0-30.9, adult: Secondary | ICD-10-CM | POA: Diagnosis not present

## 2018-08-25 DIAGNOSIS — E1140 Type 2 diabetes mellitus with diabetic neuropathy, unspecified: Secondary | ICD-10-CM | POA: Diagnosis not present

## 2018-08-25 DIAGNOSIS — Z951 Presence of aortocoronary bypass graft: Secondary | ICD-10-CM | POA: Diagnosis not present

## 2018-08-25 DIAGNOSIS — I132 Hypertensive heart and chronic kidney disease with heart failure and with stage 5 chronic kidney disease, or end stage renal disease: Secondary | ICD-10-CM | POA: Diagnosis not present

## 2018-08-25 DIAGNOSIS — M069 Rheumatoid arthritis, unspecified: Secondary | ICD-10-CM | POA: Diagnosis not present

## 2018-08-25 DIAGNOSIS — Z8673 Personal history of transient ischemic attack (TIA), and cerebral infarction without residual deficits: Secondary | ICD-10-CM | POA: Diagnosis not present

## 2018-08-25 DIAGNOSIS — Z992 Dependence on renal dialysis: Secondary | ICD-10-CM | POA: Diagnosis not present

## 2018-08-25 DIAGNOSIS — D631 Anemia in chronic kidney disease: Secondary | ICD-10-CM | POA: Diagnosis not present

## 2018-08-25 DIAGNOSIS — K219 Gastro-esophageal reflux disease without esophagitis: Secondary | ICD-10-CM | POA: Diagnosis not present

## 2018-08-25 DIAGNOSIS — F419 Anxiety disorder, unspecified: Secondary | ICD-10-CM | POA: Diagnosis not present

## 2018-08-25 DIAGNOSIS — I2510 Atherosclerotic heart disease of native coronary artery without angina pectoris: Secondary | ICD-10-CM | POA: Diagnosis not present

## 2018-08-25 DIAGNOSIS — J45909 Unspecified asthma, uncomplicated: Secondary | ICD-10-CM | POA: Diagnosis not present

## 2018-08-25 DIAGNOSIS — Z794 Long term (current) use of insulin: Secondary | ICD-10-CM | POA: Diagnosis not present

## 2018-08-25 DIAGNOSIS — I252 Old myocardial infarction: Secondary | ICD-10-CM | POA: Diagnosis not present

## 2018-08-25 DIAGNOSIS — I509 Heart failure, unspecified: Secondary | ICD-10-CM | POA: Diagnosis not present

## 2018-08-25 DIAGNOSIS — E785 Hyperlipidemia, unspecified: Secondary | ICD-10-CM | POA: Diagnosis not present

## 2018-08-25 DIAGNOSIS — E1122 Type 2 diabetes mellitus with diabetic chronic kidney disease: Secondary | ICD-10-CM | POA: Diagnosis not present

## 2018-08-26 DIAGNOSIS — N2581 Secondary hyperparathyroidism of renal origin: Secondary | ICD-10-CM | POA: Diagnosis not present

## 2018-08-26 DIAGNOSIS — N186 End stage renal disease: Secondary | ICD-10-CM | POA: Diagnosis not present

## 2018-08-26 DIAGNOSIS — D631 Anemia in chronic kidney disease: Secondary | ICD-10-CM | POA: Diagnosis not present

## 2018-08-26 DIAGNOSIS — E1129 Type 2 diabetes mellitus with other diabetic kidney complication: Secondary | ICD-10-CM | POA: Diagnosis not present

## 2018-08-28 ENCOUNTER — Telehealth: Payer: Self-pay | Admitting: Family Medicine

## 2018-08-28 DIAGNOSIS — E1129 Type 2 diabetes mellitus with other diabetic kidney complication: Secondary | ICD-10-CM | POA: Diagnosis not present

## 2018-08-28 DIAGNOSIS — D631 Anemia in chronic kidney disease: Secondary | ICD-10-CM | POA: Diagnosis not present

## 2018-08-28 DIAGNOSIS — N186 End stage renal disease: Secondary | ICD-10-CM | POA: Diagnosis not present

## 2018-08-28 DIAGNOSIS — N2581 Secondary hyperparathyroidism of renal origin: Secondary | ICD-10-CM | POA: Diagnosis not present

## 2018-08-28 NOTE — Telephone Encounter (Signed)
Joey physcial therapist [facility] called in Verbal orders Home excercise program 1week 1  Gate training 2week 3 balance training 1week5

## 2018-08-31 DIAGNOSIS — D631 Anemia in chronic kidney disease: Secondary | ICD-10-CM | POA: Diagnosis not present

## 2018-08-31 DIAGNOSIS — E1129 Type 2 diabetes mellitus with other diabetic kidney complication: Secondary | ICD-10-CM | POA: Diagnosis not present

## 2018-08-31 DIAGNOSIS — N186 End stage renal disease: Secondary | ICD-10-CM | POA: Diagnosis not present

## 2018-08-31 DIAGNOSIS — N2581 Secondary hyperparathyroidism of renal origin: Secondary | ICD-10-CM | POA: Diagnosis not present

## 2018-08-31 NOTE — Telephone Encounter (Signed)
Joey was called and given verbal orders for PT as listed.

## 2018-09-02 DIAGNOSIS — N186 End stage renal disease: Secondary | ICD-10-CM | POA: Diagnosis not present

## 2018-09-02 DIAGNOSIS — N2581 Secondary hyperparathyroidism of renal origin: Secondary | ICD-10-CM | POA: Diagnosis not present

## 2018-09-02 DIAGNOSIS — E1129 Type 2 diabetes mellitus with other diabetic kidney complication: Secondary | ICD-10-CM | POA: Diagnosis not present

## 2018-09-02 DIAGNOSIS — D631 Anemia in chronic kidney disease: Secondary | ICD-10-CM | POA: Diagnosis not present

## 2018-09-04 DIAGNOSIS — N2581 Secondary hyperparathyroidism of renal origin: Secondary | ICD-10-CM | POA: Diagnosis not present

## 2018-09-04 DIAGNOSIS — N186 End stage renal disease: Secondary | ICD-10-CM | POA: Diagnosis not present

## 2018-09-04 DIAGNOSIS — D631 Anemia in chronic kidney disease: Secondary | ICD-10-CM | POA: Diagnosis not present

## 2018-09-04 DIAGNOSIS — E1129 Type 2 diabetes mellitus with other diabetic kidney complication: Secondary | ICD-10-CM | POA: Diagnosis not present

## 2018-09-07 DIAGNOSIS — N2581 Secondary hyperparathyroidism of renal origin: Secondary | ICD-10-CM | POA: Diagnosis not present

## 2018-09-07 DIAGNOSIS — N186 End stage renal disease: Secondary | ICD-10-CM | POA: Diagnosis not present

## 2018-09-07 DIAGNOSIS — D631 Anemia in chronic kidney disease: Secondary | ICD-10-CM | POA: Diagnosis not present

## 2018-09-07 DIAGNOSIS — E1129 Type 2 diabetes mellitus with other diabetic kidney complication: Secondary | ICD-10-CM | POA: Diagnosis not present

## 2018-09-08 DIAGNOSIS — I2510 Atherosclerotic heart disease of native coronary artery without angina pectoris: Secondary | ICD-10-CM | POA: Diagnosis not present

## 2018-09-08 DIAGNOSIS — E1122 Type 2 diabetes mellitus with diabetic chronic kidney disease: Secondary | ICD-10-CM | POA: Diagnosis not present

## 2018-09-08 DIAGNOSIS — I509 Heart failure, unspecified: Secondary | ICD-10-CM | POA: Diagnosis not present

## 2018-09-08 DIAGNOSIS — I132 Hypertensive heart and chronic kidney disease with heart failure and with stage 5 chronic kidney disease, or end stage renal disease: Secondary | ICD-10-CM | POA: Diagnosis not present

## 2018-09-08 DIAGNOSIS — N186 End stage renal disease: Secondary | ICD-10-CM | POA: Diagnosis not present

## 2018-09-08 DIAGNOSIS — D631 Anemia in chronic kidney disease: Secondary | ICD-10-CM | POA: Diagnosis not present

## 2018-09-09 DIAGNOSIS — N2581 Secondary hyperparathyroidism of renal origin: Secondary | ICD-10-CM | POA: Diagnosis not present

## 2018-09-09 DIAGNOSIS — E1129 Type 2 diabetes mellitus with other diabetic kidney complication: Secondary | ICD-10-CM | POA: Diagnosis not present

## 2018-09-09 DIAGNOSIS — D631 Anemia in chronic kidney disease: Secondary | ICD-10-CM | POA: Diagnosis not present

## 2018-09-09 DIAGNOSIS — N186 End stage renal disease: Secondary | ICD-10-CM | POA: Diagnosis not present

## 2018-09-10 DIAGNOSIS — I509 Heart failure, unspecified: Secondary | ICD-10-CM | POA: Diagnosis not present

## 2018-09-10 DIAGNOSIS — I2510 Atherosclerotic heart disease of native coronary artery without angina pectoris: Secondary | ICD-10-CM | POA: Diagnosis not present

## 2018-09-10 DIAGNOSIS — E1122 Type 2 diabetes mellitus with diabetic chronic kidney disease: Secondary | ICD-10-CM | POA: Diagnosis not present

## 2018-09-10 DIAGNOSIS — N186 End stage renal disease: Secondary | ICD-10-CM | POA: Diagnosis not present

## 2018-09-10 DIAGNOSIS — I132 Hypertensive heart and chronic kidney disease with heart failure and with stage 5 chronic kidney disease, or end stage renal disease: Secondary | ICD-10-CM | POA: Diagnosis not present

## 2018-09-10 DIAGNOSIS — D631 Anemia in chronic kidney disease: Secondary | ICD-10-CM | POA: Diagnosis not present

## 2018-09-11 DIAGNOSIS — N2581 Secondary hyperparathyroidism of renal origin: Secondary | ICD-10-CM | POA: Diagnosis not present

## 2018-09-11 DIAGNOSIS — D631 Anemia in chronic kidney disease: Secondary | ICD-10-CM | POA: Diagnosis not present

## 2018-09-11 DIAGNOSIS — E1129 Type 2 diabetes mellitus with other diabetic kidney complication: Secondary | ICD-10-CM | POA: Diagnosis not present

## 2018-09-11 DIAGNOSIS — N186 End stage renal disease: Secondary | ICD-10-CM | POA: Diagnosis not present

## 2018-09-14 DIAGNOSIS — D631 Anemia in chronic kidney disease: Secondary | ICD-10-CM | POA: Diagnosis not present

## 2018-09-14 DIAGNOSIS — N186 End stage renal disease: Secondary | ICD-10-CM | POA: Diagnosis not present

## 2018-09-14 DIAGNOSIS — H35371 Puckering of macula, right eye: Secondary | ICD-10-CM | POA: Diagnosis not present

## 2018-09-14 DIAGNOSIS — E1129 Type 2 diabetes mellitus with other diabetic kidney complication: Secondary | ICD-10-CM | POA: Diagnosis not present

## 2018-09-14 DIAGNOSIS — I132 Hypertensive heart and chronic kidney disease with heart failure and with stage 5 chronic kidney disease, or end stage renal disease: Secondary | ICD-10-CM | POA: Diagnosis not present

## 2018-09-14 DIAGNOSIS — I2510 Atherosclerotic heart disease of native coronary artery without angina pectoris: Secondary | ICD-10-CM | POA: Diagnosis not present

## 2018-09-14 DIAGNOSIS — E113412 Type 2 diabetes mellitus with severe nonproliferative diabetic retinopathy with macular edema, left eye: Secondary | ICD-10-CM | POA: Diagnosis not present

## 2018-09-14 DIAGNOSIS — Z992 Dependence on renal dialysis: Secondary | ICD-10-CM | POA: Diagnosis not present

## 2018-09-14 DIAGNOSIS — E1122 Type 2 diabetes mellitus with diabetic chronic kidney disease: Secondary | ICD-10-CM | POA: Diagnosis not present

## 2018-09-14 DIAGNOSIS — I129 Hypertensive chronic kidney disease with stage 1 through stage 4 chronic kidney disease, or unspecified chronic kidney disease: Secondary | ICD-10-CM | POA: Diagnosis not present

## 2018-09-14 DIAGNOSIS — E113411 Type 2 diabetes mellitus with severe nonproliferative diabetic retinopathy with macular edema, right eye: Secondary | ICD-10-CM | POA: Diagnosis not present

## 2018-09-14 DIAGNOSIS — I509 Heart failure, unspecified: Secondary | ICD-10-CM | POA: Diagnosis not present

## 2018-09-14 DIAGNOSIS — H3561 Retinal hemorrhage, right eye: Secondary | ICD-10-CM | POA: Diagnosis not present

## 2018-09-14 DIAGNOSIS — N2581 Secondary hyperparathyroidism of renal origin: Secondary | ICD-10-CM | POA: Diagnosis not present

## 2018-09-15 DIAGNOSIS — N186 End stage renal disease: Secondary | ICD-10-CM | POA: Diagnosis not present

## 2018-09-15 DIAGNOSIS — I2510 Atherosclerotic heart disease of native coronary artery without angina pectoris: Secondary | ICD-10-CM | POA: Diagnosis not present

## 2018-09-15 DIAGNOSIS — D631 Anemia in chronic kidney disease: Secondary | ICD-10-CM | POA: Diagnosis not present

## 2018-09-15 DIAGNOSIS — I509 Heart failure, unspecified: Secondary | ICD-10-CM | POA: Diagnosis not present

## 2018-09-15 DIAGNOSIS — H35371 Puckering of macula, right eye: Secondary | ICD-10-CM | POA: Diagnosis not present

## 2018-09-15 DIAGNOSIS — E1122 Type 2 diabetes mellitus with diabetic chronic kidney disease: Secondary | ICD-10-CM | POA: Diagnosis not present

## 2018-09-15 DIAGNOSIS — E113412 Type 2 diabetes mellitus with severe nonproliferative diabetic retinopathy with macular edema, left eye: Secondary | ICD-10-CM | POA: Diagnosis not present

## 2018-09-15 DIAGNOSIS — I132 Hypertensive heart and chronic kidney disease with heart failure and with stage 5 chronic kidney disease, or end stage renal disease: Secondary | ICD-10-CM | POA: Diagnosis not present

## 2018-09-15 DIAGNOSIS — H3561 Retinal hemorrhage, right eye: Secondary | ICD-10-CM | POA: Diagnosis not present

## 2018-09-15 DIAGNOSIS — E113411 Type 2 diabetes mellitus with severe nonproliferative diabetic retinopathy with macular edema, right eye: Secondary | ICD-10-CM | POA: Diagnosis not present

## 2018-09-16 DIAGNOSIS — D631 Anemia in chronic kidney disease: Secondary | ICD-10-CM | POA: Diagnosis not present

## 2018-09-16 DIAGNOSIS — N186 End stage renal disease: Secondary | ICD-10-CM | POA: Diagnosis not present

## 2018-09-16 DIAGNOSIS — N2581 Secondary hyperparathyroidism of renal origin: Secondary | ICD-10-CM | POA: Diagnosis not present

## 2018-09-16 DIAGNOSIS — E1129 Type 2 diabetes mellitus with other diabetic kidney complication: Secondary | ICD-10-CM | POA: Diagnosis not present

## 2018-09-17 DIAGNOSIS — I509 Heart failure, unspecified: Secondary | ICD-10-CM | POA: Diagnosis not present

## 2018-09-17 DIAGNOSIS — M0579 Rheumatoid arthritis with rheumatoid factor of multiple sites without organ or systems involvement: Secondary | ICD-10-CM | POA: Diagnosis not present

## 2018-09-17 DIAGNOSIS — I132 Hypertensive heart and chronic kidney disease with heart failure and with stage 5 chronic kidney disease, or end stage renal disease: Secondary | ICD-10-CM | POA: Diagnosis not present

## 2018-09-17 DIAGNOSIS — N186 End stage renal disease: Secondary | ICD-10-CM | POA: Diagnosis not present

## 2018-09-17 DIAGNOSIS — E1122 Type 2 diabetes mellitus with diabetic chronic kidney disease: Secondary | ICD-10-CM | POA: Diagnosis not present

## 2018-09-17 DIAGNOSIS — M109 Gout, unspecified: Secondary | ICD-10-CM | POA: Diagnosis not present

## 2018-09-17 DIAGNOSIS — Z79899 Other long term (current) drug therapy: Secondary | ICD-10-CM | POA: Diagnosis not present

## 2018-09-17 DIAGNOSIS — M2550 Pain in unspecified joint: Secondary | ICD-10-CM | POA: Diagnosis not present

## 2018-09-17 DIAGNOSIS — D631 Anemia in chronic kidney disease: Secondary | ICD-10-CM | POA: Diagnosis not present

## 2018-09-17 DIAGNOSIS — I2510 Atherosclerotic heart disease of native coronary artery without angina pectoris: Secondary | ICD-10-CM | POA: Diagnosis not present

## 2018-09-18 DIAGNOSIS — N2581 Secondary hyperparathyroidism of renal origin: Secondary | ICD-10-CM | POA: Diagnosis not present

## 2018-09-18 DIAGNOSIS — E1129 Type 2 diabetes mellitus with other diabetic kidney complication: Secondary | ICD-10-CM | POA: Diagnosis not present

## 2018-09-18 DIAGNOSIS — N186 End stage renal disease: Secondary | ICD-10-CM | POA: Diagnosis not present

## 2018-09-18 DIAGNOSIS — D631 Anemia in chronic kidney disease: Secondary | ICD-10-CM | POA: Diagnosis not present

## 2018-09-21 DIAGNOSIS — D631 Anemia in chronic kidney disease: Secondary | ICD-10-CM | POA: Diagnosis not present

## 2018-09-21 DIAGNOSIS — N186 End stage renal disease: Secondary | ICD-10-CM | POA: Diagnosis not present

## 2018-09-21 DIAGNOSIS — N2581 Secondary hyperparathyroidism of renal origin: Secondary | ICD-10-CM | POA: Diagnosis not present

## 2018-09-21 DIAGNOSIS — E1129 Type 2 diabetes mellitus with other diabetic kidney complication: Secondary | ICD-10-CM | POA: Diagnosis not present

## 2018-09-22 DIAGNOSIS — I132 Hypertensive heart and chronic kidney disease with heart failure and with stage 5 chronic kidney disease, or end stage renal disease: Secondary | ICD-10-CM | POA: Diagnosis not present

## 2018-09-22 DIAGNOSIS — I2510 Atherosclerotic heart disease of native coronary artery without angina pectoris: Secondary | ICD-10-CM | POA: Diagnosis not present

## 2018-09-22 DIAGNOSIS — E1122 Type 2 diabetes mellitus with diabetic chronic kidney disease: Secondary | ICD-10-CM | POA: Diagnosis not present

## 2018-09-22 DIAGNOSIS — D631 Anemia in chronic kidney disease: Secondary | ICD-10-CM | POA: Diagnosis not present

## 2018-09-22 DIAGNOSIS — N186 End stage renal disease: Secondary | ICD-10-CM | POA: Diagnosis not present

## 2018-09-22 DIAGNOSIS — I509 Heart failure, unspecified: Secondary | ICD-10-CM | POA: Diagnosis not present

## 2018-09-23 DIAGNOSIS — E1129 Type 2 diabetes mellitus with other diabetic kidney complication: Secondary | ICD-10-CM | POA: Diagnosis not present

## 2018-09-23 DIAGNOSIS — N2581 Secondary hyperparathyroidism of renal origin: Secondary | ICD-10-CM | POA: Diagnosis not present

## 2018-09-23 DIAGNOSIS — D631 Anemia in chronic kidney disease: Secondary | ICD-10-CM | POA: Diagnosis not present

## 2018-09-23 DIAGNOSIS — N186 End stage renal disease: Secondary | ICD-10-CM | POA: Diagnosis not present

## 2018-09-24 DIAGNOSIS — Z794 Long term (current) use of insulin: Secondary | ICD-10-CM | POA: Diagnosis not present

## 2018-09-24 DIAGNOSIS — E039 Hypothyroidism, unspecified: Secondary | ICD-10-CM | POA: Diagnosis not present

## 2018-09-24 DIAGNOSIS — I509 Heart failure, unspecified: Secondary | ICD-10-CM | POA: Diagnosis not present

## 2018-09-24 DIAGNOSIS — D631 Anemia in chronic kidney disease: Secondary | ICD-10-CM | POA: Diagnosis not present

## 2018-09-24 DIAGNOSIS — F419 Anxiety disorder, unspecified: Secondary | ICD-10-CM | POA: Diagnosis not present

## 2018-09-24 DIAGNOSIS — Z8673 Personal history of transient ischemic attack (TIA), and cerebral infarction without residual deficits: Secondary | ICD-10-CM | POA: Diagnosis not present

## 2018-09-24 DIAGNOSIS — E1122 Type 2 diabetes mellitus with diabetic chronic kidney disease: Secondary | ICD-10-CM | POA: Diagnosis not present

## 2018-09-24 DIAGNOSIS — I252 Old myocardial infarction: Secondary | ICD-10-CM | POA: Diagnosis not present

## 2018-09-24 DIAGNOSIS — E669 Obesity, unspecified: Secondary | ICD-10-CM | POA: Diagnosis not present

## 2018-09-24 DIAGNOSIS — Z951 Presence of aortocoronary bypass graft: Secondary | ICD-10-CM | POA: Diagnosis not present

## 2018-09-24 DIAGNOSIS — Z992 Dependence on renal dialysis: Secondary | ICD-10-CM | POA: Diagnosis not present

## 2018-09-24 DIAGNOSIS — E1151 Type 2 diabetes mellitus with diabetic peripheral angiopathy without gangrene: Secondary | ICD-10-CM | POA: Diagnosis not present

## 2018-09-24 DIAGNOSIS — E785 Hyperlipidemia, unspecified: Secondary | ICD-10-CM | POA: Diagnosis not present

## 2018-09-24 DIAGNOSIS — K219 Gastro-esophageal reflux disease without esophagitis: Secondary | ICD-10-CM | POA: Diagnosis not present

## 2018-09-24 DIAGNOSIS — N186 End stage renal disease: Secondary | ICD-10-CM | POA: Diagnosis not present

## 2018-09-24 DIAGNOSIS — I132 Hypertensive heart and chronic kidney disease with heart failure and with stage 5 chronic kidney disease, or end stage renal disease: Secondary | ICD-10-CM | POA: Diagnosis not present

## 2018-09-24 DIAGNOSIS — Z6830 Body mass index (BMI) 30.0-30.9, adult: Secondary | ICD-10-CM | POA: Diagnosis not present

## 2018-09-24 DIAGNOSIS — E1140 Type 2 diabetes mellitus with diabetic neuropathy, unspecified: Secondary | ICD-10-CM | POA: Diagnosis not present

## 2018-09-24 DIAGNOSIS — I2510 Atherosclerotic heart disease of native coronary artery without angina pectoris: Secondary | ICD-10-CM | POA: Diagnosis not present

## 2018-09-24 DIAGNOSIS — J45909 Unspecified asthma, uncomplicated: Secondary | ICD-10-CM | POA: Diagnosis not present

## 2018-09-24 DIAGNOSIS — M069 Rheumatoid arthritis, unspecified: Secondary | ICD-10-CM | POA: Diagnosis not present

## 2018-09-25 DIAGNOSIS — N186 End stage renal disease: Secondary | ICD-10-CM | POA: Diagnosis not present

## 2018-09-25 DIAGNOSIS — N2581 Secondary hyperparathyroidism of renal origin: Secondary | ICD-10-CM | POA: Diagnosis not present

## 2018-09-25 DIAGNOSIS — D631 Anemia in chronic kidney disease: Secondary | ICD-10-CM | POA: Diagnosis not present

## 2018-09-25 DIAGNOSIS — E1129 Type 2 diabetes mellitus with other diabetic kidney complication: Secondary | ICD-10-CM | POA: Diagnosis not present

## 2018-09-28 DIAGNOSIS — N2581 Secondary hyperparathyroidism of renal origin: Secondary | ICD-10-CM | POA: Diagnosis not present

## 2018-09-28 DIAGNOSIS — N186 End stage renal disease: Secondary | ICD-10-CM | POA: Diagnosis not present

## 2018-09-28 DIAGNOSIS — D631 Anemia in chronic kidney disease: Secondary | ICD-10-CM | POA: Diagnosis not present

## 2018-09-28 DIAGNOSIS — E1129 Type 2 diabetes mellitus with other diabetic kidney complication: Secondary | ICD-10-CM | POA: Diagnosis not present

## 2018-09-30 DIAGNOSIS — D631 Anemia in chronic kidney disease: Secondary | ICD-10-CM | POA: Diagnosis not present

## 2018-09-30 DIAGNOSIS — E039 Hypothyroidism, unspecified: Secondary | ICD-10-CM | POA: Diagnosis not present

## 2018-09-30 DIAGNOSIS — E1129 Type 2 diabetes mellitus with other diabetic kidney complication: Secondary | ICD-10-CM | POA: Diagnosis not present

## 2018-09-30 DIAGNOSIS — N186 End stage renal disease: Secondary | ICD-10-CM | POA: Diagnosis not present

## 2018-09-30 DIAGNOSIS — N2581 Secondary hyperparathyroidism of renal origin: Secondary | ICD-10-CM | POA: Diagnosis not present

## 2018-10-02 DIAGNOSIS — E1129 Type 2 diabetes mellitus with other diabetic kidney complication: Secondary | ICD-10-CM | POA: Diagnosis not present

## 2018-10-02 DIAGNOSIS — N186 End stage renal disease: Secondary | ICD-10-CM | POA: Diagnosis not present

## 2018-10-02 DIAGNOSIS — N2581 Secondary hyperparathyroidism of renal origin: Secondary | ICD-10-CM | POA: Diagnosis not present

## 2018-10-02 DIAGNOSIS — D631 Anemia in chronic kidney disease: Secondary | ICD-10-CM | POA: Diagnosis not present

## 2018-10-03 ENCOUNTER — Other Ambulatory Visit: Payer: Self-pay | Admitting: Family Medicine

## 2018-10-03 DIAGNOSIS — I739 Peripheral vascular disease, unspecified: Secondary | ICD-10-CM

## 2018-10-05 DIAGNOSIS — N2581 Secondary hyperparathyroidism of renal origin: Secondary | ICD-10-CM | POA: Diagnosis not present

## 2018-10-05 DIAGNOSIS — D631 Anemia in chronic kidney disease: Secondary | ICD-10-CM | POA: Diagnosis not present

## 2018-10-05 DIAGNOSIS — N186 End stage renal disease: Secondary | ICD-10-CM | POA: Diagnosis not present

## 2018-10-05 DIAGNOSIS — E1129 Type 2 diabetes mellitus with other diabetic kidney complication: Secondary | ICD-10-CM | POA: Diagnosis not present

## 2018-10-06 ENCOUNTER — Other Ambulatory Visit: Payer: Self-pay

## 2018-10-06 ENCOUNTER — Encounter: Payer: Self-pay | Admitting: Sports Medicine

## 2018-10-06 ENCOUNTER — Ambulatory Visit (INDEPENDENT_AMBULATORY_CARE_PROVIDER_SITE_OTHER): Payer: Medicare Other | Admitting: Sports Medicine

## 2018-10-06 DIAGNOSIS — L84 Corns and callosities: Secondary | ICD-10-CM

## 2018-10-06 DIAGNOSIS — I2510 Atherosclerotic heart disease of native coronary artery without angina pectoris: Secondary | ICD-10-CM | POA: Diagnosis not present

## 2018-10-06 DIAGNOSIS — I509 Heart failure, unspecified: Secondary | ICD-10-CM | POA: Diagnosis not present

## 2018-10-06 DIAGNOSIS — B351 Tinea unguium: Secondary | ICD-10-CM

## 2018-10-06 DIAGNOSIS — M79675 Pain in left toe(s): Secondary | ICD-10-CM | POA: Diagnosis not present

## 2018-10-06 DIAGNOSIS — D631 Anemia in chronic kidney disease: Secondary | ICD-10-CM | POA: Diagnosis not present

## 2018-10-06 DIAGNOSIS — E1142 Type 2 diabetes mellitus with diabetic polyneuropathy: Secondary | ICD-10-CM

## 2018-10-06 DIAGNOSIS — N186 End stage renal disease: Secondary | ICD-10-CM | POA: Diagnosis not present

## 2018-10-06 DIAGNOSIS — M79674 Pain in right toe(s): Secondary | ICD-10-CM | POA: Diagnosis not present

## 2018-10-06 DIAGNOSIS — I132 Hypertensive heart and chronic kidney disease with heart failure and with stage 5 chronic kidney disease, or end stage renal disease: Secondary | ICD-10-CM | POA: Diagnosis not present

## 2018-10-06 DIAGNOSIS — I739 Peripheral vascular disease, unspecified: Secondary | ICD-10-CM

## 2018-10-06 DIAGNOSIS — E1122 Type 2 diabetes mellitus with diabetic chronic kidney disease: Secondary | ICD-10-CM | POA: Diagnosis not present

## 2018-10-06 NOTE — Progress Notes (Signed)
Subjective: Brooke Stafford is a 63 y.o. female patient seen in office for follow up nail and callus care.  Patient reports that she is having pain at her big toe and right 3rd toe. Pain at 1st toe is at callus and occasional at right 3rd toe. Denies any new trauma or injury. Reports last vascular check up was good and will see again in 6 months. Patient has no other pedal complaints at this time.  FBS this AM was 91 today.   Patient is also a dialysis patient and is on Plavix, follows with vascular as above. Denies any other acute issues or new problems since last encounter.   Patient Active Problem List   Diagnosis Date Noted  . Heme positive stool   . Benign neoplasm of ascending colon   . Benign neoplasm of transverse colon   . Benign neoplasm of descending colon   . Hypercholesterolemia 06/18/2017  . Coronary artery disease of autologous vein bypass graft with stable angina pectoris ([place]) 06/18/2017  . Diabetic neuropathy ([place]) 04/17/2017  . GERD (gastroesophageal reflux disease) 04/17/2017  . Foot ulcer, right ([place]) 11/28/2016  . Cervical polyp 11/12/2016  . AV (arteriovenous fistula) ([place]) 06/17/2016  . Mixed hyperlipidemia 05/21/2016  . ESRD (end stage renal disease) ([place])   . Hypokalemia 05/03/2016  . COPD exacerbation ([place]) 05/03/2016  . COPD (chronic obstructive pulmonary disease) ([place]) 04/11/2016  . CAP (community acquired pneumonia) 03/26/2016  . Acute respiratory failure with hypoxia ([place]) 03/26/2016  . Acute on chronic combined systolic and diastolic CHF (congestive heart failure) ([place]) 03/26/2016  . Medication management 03/21/2016  . Anemia of chronic disease 12/27/2015  . Acute on chronic systolic CHF (congestive heart failure) ([place])   . Acute CHF ([place]) 12/18/2015  . Hypertensive heart and renal disease with CHF and ESRD ([place]) 12/18/2015  . Constipation 12/18/2015  . Anxiety 11/02/2015  . Posterior circulation stroke ([place]) 11/02/2015  . Abnormality of gait  10/23/2015  . Depression 09/28/2015  . Diabetic retinopathy ([place]) 09/13/2015  . Grief reaction 08/24/2015  . Creatinine elevation 07/25/2015  . Chronic combined systolic and diastolic congestive heart failure ([place]) 07/25/2015  . Rash of hands 02/23/2015  . Essential hypertension   . Angina pectoris ([place]) 12/12/2014  . Abnormal nuclear stress test   . Acute combined systolic and diastolic heart failure ([place]) 12/02/2014  . Seasonal allergies 08/11/2014  . Gout 08/11/2014  . Encounter for screening mammogram for breast cancer 05/16/2014  . Screening for colon cancer 05/16/2014  . Type 2 diabetes mellitus with ESRD (end-stage renal disease) ([place]) 01/27/2014  . Atopic eczema 01/27/2014  . Precordial pain, atypical, negative MI, Musculature Skeletal pain  11/08/2013  . CKD (chronic kidney disease) stage 5, GFR less than 15 ml/min ([facility]) 11/08/2013  . Unstable angina ([place]) 09/28/2013  . Ischemic cardiomyopathy- new drop in EF 08/30/2013  . Chest pain 08/04/2013  . CAD -S/P PCI June 2015 and 12/14/14 02/01/2013  . Hypothyroidism, acquired 02/01/2013  . PVD, LSFA PTA 12/12 04/06/2011  . Hx of CABG x 6 2002 04/06/2011  . Dyslipidemia 04/06/2011   Current Outpatient Medications on File Prior to Visit  Medication Sig Dispense Refill  . Alcohol Swabs (ALCOHOL PREP) PADS     . allopurinol (ZYLOPRIM) 300 MG tablet TAKE 1 TABLET(300 MG) BY MOUTH DAILY 90 tablet 1  . aspirin 81 MG chewable tablet Chew 1 tablet (81 mg total) by mouth daily. 30 tablet 10  . atorvastatin (LIPITOR) 40 MG tablet TAKE 1 TABLET(40 MG)  BY MOUTH EVERY EVENING 90 tablet 3  . AURYXIA 1 GM 210 MG(Fe) tablet TAKE 2 TABLETS BY MOUTH THREE TIMES A DAY WITH MEALS.   SWALLOW WHOLE, DO NOT CHEW OR CRUSH MEDICATION  1  . Blood Glucose Monitoring Suppl (ACCU-CHEK AVIVA CONNECT) w/Device KIT 16 Units by Does not apply route daily. 1 kit 0  . busPIRone (BUSPAR) 10 MG tablet Take 1 tablet (10 mg total) by mouth 2 (two) times daily. 180  tablet 1  . cetirizine (ZYRTEC) 10 MG tablet Take 10 mg by mouth daily.    . ciprofloxacin (CIPRO) 500 MG tablet Take 1 tablet (500 mg total) by mouth daily with breakfast. Administer after dialysis on hemodialysis days. 7 tablet 0  . clopidogrel (PLAVIX) 75 MG tablet Take 1 tablet (75 mg total) by mouth daily. 90 tablet 1  . fluticasone (FLONASE) 50 MCG/ACT nasal spray Place 1 spray into both nostrils daily. (Patient taking differently: Place 2 sprays into both nostrils daily. ) 48 g 2  . gabapentin (NEURONTIN) 400 MG capsule Take 1 capsule (400 mg total) by mouth 2 (two) times daily. 180 capsule 1  . glucose blood (ACCU-CHEK ACTIVE STRIPS) test strip Use as instructed 100 each 12  . hydroxychloroquine (PLAQUENIL) 200 MG tablet TAKE 2 TABLETS BY MOUTH EVERY DAY WITH FOOD OR MILK  0  . hydrOXYzine (ATARAX/VISTARIL) 25 MG tablet Take 0.5 tablets (12.5 mg total) by mouth daily as needed. (Patient taking differently: Take 12.5 mg by mouth daily as needed for anxiety. ) 30 tablet 0  . insulin aspart (NOVOLOG FLEXPEN) 100 UNIT/ML FlexPen Inject 5-8 Units into the skin 3 (three) times daily with meals. 15 mL 11  . Insulin Glargine (BASAGLAR KWIKPEN) 100 UNIT/ML SOPN INJECT 10 UNITS INTO THE SKIN AT BEDTIME 45 mL 3  . Insulin Pen Needle 32G X 4 MM MISC Use 4x a day 200 each 3  . ipratropium (ATROVENT) 0.03 % nasal spray Place 2 sprays into both nostrils every 12 (twelve) hours. (Patient taking differently: Place 2 sprays into both nostrils 2 (two) times daily as needed for rhinitis. ) 30 mL 6  . isosorbide mononitrate (IMDUR) 60 MG 24 hr tablet TAKE 2 TABLETS(120 MG) BY MOUTH DAILY 180 tablet 1  . Lancets (ACCU-CHEK SOFT TOUCH) lancets Use as instructed 100 each 12  . levothyroxine (SYNTHROID) 25 MCG tablet Take 1 tablet (25 mcg total) by mouth daily before breakfast. 90 tablet 1  . lidocaine-prilocaine (EMLA) cream Apply 1 application topically as needed (for port access).   4  . LINZESS 145 MCG CAPS  capsule TAKE 1 CAPSULE(145 MCG) BY MOUTH DAILY BEFORE BREAKFAST 30 capsule 0  . metoCLOPramide (REGLAN) 5 MG tablet Take 1 tablet (5 mg total) by mouth every 8 (eight) hours as needed for nausea. 90 tablet 3  . metoprolol tartrate (LOPRESSOR) 25 MG tablet     . multivitamin (RENA-VIT) TABS tablet Take 1 tablet by mouth daily.    . Na Sulfate-K Sulfate-Mg Sulf 17.5-3.13-1.6 GM/177ML SOLN Suprep-Use as directed 354 mL 0  . nitroGLYCERIN (NITROSTAT) 0.4 MG SL tablet Place 1 tablet (0.4 mg total) under the tongue every 5 (five) minutes as needed. 30 tablet 2  . NOVOLOG FLEXPEN 100 UNIT/ML FlexPen ADMINISTER 5 UNITS UNDER THE SKIN THREE TIMES DAILY WITH MEALS 15 mL 0  . ORENCIA CLICKJECT 517 MG/ML SOAJ     . RENVELA 800 MG tablet TAKE 3 TABLETS BY MOUTH THREE TIMES DAILY WITH MEALS  3  . sevelamer (  RENAGEL) 800 MG tablet Take 2,400 mg by mouth 3 (three) times daily with meals.    . traMADol (ULTRAM) 50 MG tablet Take 1 tablet (50 mg total) by mouth every 12 (twelve) hours as needed for moderate pain. 40 tablet 1  . triamcinolone cream (KENALOG) 0.1 % Apply 1 application topically 2 (two) times daily. 45 g 1   No current facility-administered medications on file prior to visit.    Allergies  Allergen Reactions  . Digoxin And Related Diarrhea and Other (See Comments)    TOXIC DRUG LEVELS Patient stated she almost died. Had flu like symptoms as well as diarrhea.  . Hydralazine Shortness Of Breath  . Penicillins Cross Reactors Hives and Other (See Comments)    HIGH FEVER  Has patient had a PCN reaction causing immediate rash, facial/tongue/throat swelling, SOB or lightheadedness with hypotension: No Has patient had a PCN reaction causing SEVERE RASH INVOLVING MUCUS MEMBRANES or SKIN NECROSIS  #  #  #  YES  #  #  #  Has patient had a PCN reaction that required hospitalization: Unk Has patient had a PCN reaction occurring within the last 10 years: Unk If all of the above answers are "NO", then may  proceed with Cephalosporin use.   [name] Lisinopril Other (See Comments)    Felt like she had the flu. Was very sick!!!  . Adhesive [Tape] Rash and Other (See Comments)    bruising   Objective: There were no vitals filed for this visit.   General: Patient is awake, alert, oriented x 3 and in no acute distress.  Dermatology: Skin is warm and dry bilateral with a continued healed ulceration  at right great toe. Minimal reactive callus to toe with no other acute signs of infection. All Nails are elongated and mycotic x10.    Vascular: Dorsalis Pedis pulse = 1/4 Bilateral,  Posterior Tibial pulse = 0/4 Bilateral,  Capillary Fill Time < 5 seconds  Neurologic: Protective sensation absent bilateral.  Musculosketal:  No pain with palpation to right toes except some sharp pain on right and new arch pain since getting new shoes, no pain with compression to calves bilateral. Asymptomatic bunion and hammertoe and pes planus bony deformities noted bilateral.  No results for input(s): GRAMSTAIN, LABORGA in the last 8760 hours.  Assessment and Plan:  Problem List Items Addressed This Visit      Nervous and Auditory   Diabetic neuropathy ([place])    Other Visit Diagnoses    Pain due to onychomycosis of toenails of both feet    -  Primary   PAD (peripheral artery disease) (HCC)       Relevant Medications   metoprolol tartrate (LOPRESSOR) 25 MG tablet   Callus          -Examined patient -Nails x 10 debrided using sterile nail nipper without incident and reduced reactive callus skin at the right great toe with no underlying opening noted using a sterile chisel blade at no additional charge. -Dispensed new toe cap for right 1st toe -Continue with Diabetic shoes -Cont with daily inspection of feet in setting of Diabetes with PAD -Patient to return in 10 weeks for routine foot care with Dr. Elisha Ponder or myself or sooner if problems arise.   Landis Martins, DPM

## 2018-10-07 DIAGNOSIS — E1129 Type 2 diabetes mellitus with other diabetic kidney complication: Secondary | ICD-10-CM | POA: Diagnosis not present

## 2018-10-07 DIAGNOSIS — N2581 Secondary hyperparathyroidism of renal origin: Secondary | ICD-10-CM | POA: Diagnosis not present

## 2018-10-07 DIAGNOSIS — N186 End stage renal disease: Secondary | ICD-10-CM | POA: Diagnosis not present

## 2018-10-07 DIAGNOSIS — D631 Anemia in chronic kidney disease: Secondary | ICD-10-CM | POA: Diagnosis not present

## 2018-10-09 DIAGNOSIS — D631 Anemia in chronic kidney disease: Secondary | ICD-10-CM | POA: Diagnosis not present

## 2018-10-09 DIAGNOSIS — N186 End stage renal disease: Secondary | ICD-10-CM | POA: Diagnosis not present

## 2018-10-09 DIAGNOSIS — N2581 Secondary hyperparathyroidism of renal origin: Secondary | ICD-10-CM | POA: Diagnosis not present

## 2018-10-09 DIAGNOSIS — E1129 Type 2 diabetes mellitus with other diabetic kidney complication: Secondary | ICD-10-CM | POA: Diagnosis not present

## 2018-10-12 DIAGNOSIS — N186 End stage renal disease: Secondary | ICD-10-CM | POA: Diagnosis not present

## 2018-10-12 DIAGNOSIS — N2581 Secondary hyperparathyroidism of renal origin: Secondary | ICD-10-CM | POA: Diagnosis not present

## 2018-10-12 DIAGNOSIS — D631 Anemia in chronic kidney disease: Secondary | ICD-10-CM | POA: Diagnosis not present

## 2018-10-12 DIAGNOSIS — E1129 Type 2 diabetes mellitus with other diabetic kidney complication: Secondary | ICD-10-CM | POA: Diagnosis not present

## 2018-10-13 DIAGNOSIS — I132 Hypertensive heart and chronic kidney disease with heart failure and with stage 5 chronic kidney disease, or end stage renal disease: Secondary | ICD-10-CM | POA: Diagnosis not present

## 2018-10-13 DIAGNOSIS — N186 End stage renal disease: Secondary | ICD-10-CM | POA: Diagnosis not present

## 2018-10-13 DIAGNOSIS — I509 Heart failure, unspecified: Secondary | ICD-10-CM | POA: Diagnosis not present

## 2018-10-13 DIAGNOSIS — I2510 Atherosclerotic heart disease of native coronary artery without angina pectoris: Secondary | ICD-10-CM | POA: Diagnosis not present

## 2018-10-13 DIAGNOSIS — E1122 Type 2 diabetes mellitus with diabetic chronic kidney disease: Secondary | ICD-10-CM | POA: Diagnosis not present

## 2018-10-13 DIAGNOSIS — D631 Anemia in chronic kidney disease: Secondary | ICD-10-CM | POA: Diagnosis not present

## 2018-10-14 DIAGNOSIS — I129 Hypertensive chronic kidney disease with stage 1 through stage 4 chronic kidney disease, or unspecified chronic kidney disease: Secondary | ICD-10-CM | POA: Diagnosis not present

## 2018-10-14 DIAGNOSIS — N186 End stage renal disease: Secondary | ICD-10-CM | POA: Diagnosis not present

## 2018-10-14 DIAGNOSIS — E1129 Type 2 diabetes mellitus with other diabetic kidney complication: Secondary | ICD-10-CM | POA: Diagnosis not present

## 2018-10-14 DIAGNOSIS — D631 Anemia in chronic kidney disease: Secondary | ICD-10-CM | POA: Diagnosis not present

## 2018-10-14 DIAGNOSIS — N2581 Secondary hyperparathyroidism of renal origin: Secondary | ICD-10-CM | POA: Diagnosis not present

## 2018-10-14 DIAGNOSIS — Z992 Dependence on renal dialysis: Secondary | ICD-10-CM | POA: Diagnosis not present

## 2018-10-16 DIAGNOSIS — D631 Anemia in chronic kidney disease: Secondary | ICD-10-CM | POA: Diagnosis not present

## 2018-10-16 DIAGNOSIS — E1129 Type 2 diabetes mellitus with other diabetic kidney complication: Secondary | ICD-10-CM | POA: Diagnosis not present

## 2018-10-16 DIAGNOSIS — N186 End stage renal disease: Secondary | ICD-10-CM | POA: Diagnosis not present

## 2018-10-16 DIAGNOSIS — N2581 Secondary hyperparathyroidism of renal origin: Secondary | ICD-10-CM | POA: Diagnosis not present

## 2018-10-19 DIAGNOSIS — N2581 Secondary hyperparathyroidism of renal origin: Secondary | ICD-10-CM | POA: Diagnosis not present

## 2018-10-19 DIAGNOSIS — E1129 Type 2 diabetes mellitus with other diabetic kidney complication: Secondary | ICD-10-CM | POA: Diagnosis not present

## 2018-10-19 DIAGNOSIS — D631 Anemia in chronic kidney disease: Secondary | ICD-10-CM | POA: Diagnosis not present

## 2018-10-19 DIAGNOSIS — N186 End stage renal disease: Secondary | ICD-10-CM | POA: Diagnosis not present

## 2018-10-20 ENCOUNTER — Other Ambulatory Visit: Payer: Self-pay | Admitting: *Deleted

## 2018-10-20 DIAGNOSIS — L299 Pruritus, unspecified: Secondary | ICD-10-CM | POA: Diagnosis not present

## 2018-10-20 DIAGNOSIS — L910 Hypertrophic scar: Secondary | ICD-10-CM | POA: Diagnosis not present

## 2018-10-20 NOTE — Patient Outreach (Signed)
[facility]) Care Management  10/20/2018  TYEISHA DINAN Aug 25, 1955 [id number]   RN Health Coach telephone call to patient.  [employer] compliance verified. Patient is in the Dr office and requested call back.  [place] Care Management [phone]

## 2018-10-21 DIAGNOSIS — N186 End stage renal disease: Secondary | ICD-10-CM | POA: Diagnosis not present

## 2018-10-21 DIAGNOSIS — N2581 Secondary hyperparathyroidism of renal origin: Secondary | ICD-10-CM | POA: Diagnosis not present

## 2018-10-21 DIAGNOSIS — E1129 Type 2 diabetes mellitus with other diabetic kidney complication: Secondary | ICD-10-CM | POA: Diagnosis not present

## 2018-10-21 DIAGNOSIS — D631 Anemia in chronic kidney disease: Secondary | ICD-10-CM | POA: Diagnosis not present

## 2018-10-22 DIAGNOSIS — E1122 Type 2 diabetes mellitus with diabetic chronic kidney disease: Secondary | ICD-10-CM | POA: Diagnosis not present

## 2018-10-22 DIAGNOSIS — I132 Hypertensive heart and chronic kidney disease with heart failure and with stage 5 chronic kidney disease, or end stage renal disease: Secondary | ICD-10-CM | POA: Diagnosis not present

## 2018-10-22 DIAGNOSIS — I2510 Atherosclerotic heart disease of native coronary artery without angina pectoris: Secondary | ICD-10-CM | POA: Diagnosis not present

## 2018-10-22 DIAGNOSIS — D631 Anemia in chronic kidney disease: Secondary | ICD-10-CM | POA: Diagnosis not present

## 2018-10-22 DIAGNOSIS — I509 Heart failure, unspecified: Secondary | ICD-10-CM | POA: Diagnosis not present

## 2018-10-22 DIAGNOSIS — N186 End stage renal disease: Secondary | ICD-10-CM | POA: Diagnosis not present

## 2018-10-23 DIAGNOSIS — E1129 Type 2 diabetes mellitus with other diabetic kidney complication: Secondary | ICD-10-CM | POA: Diagnosis not present

## 2018-10-23 DIAGNOSIS — N2581 Secondary hyperparathyroidism of renal origin: Secondary | ICD-10-CM | POA: Diagnosis not present

## 2018-10-23 DIAGNOSIS — D631 Anemia in chronic kidney disease: Secondary | ICD-10-CM | POA: Diagnosis not present

## 2018-10-23 DIAGNOSIS — N186 End stage renal disease: Secondary | ICD-10-CM | POA: Diagnosis not present

## 2018-10-26 DIAGNOSIS — E1129 Type 2 diabetes mellitus with other diabetic kidney complication: Secondary | ICD-10-CM | POA: Diagnosis not present

## 2018-10-26 DIAGNOSIS — N186 End stage renal disease: Secondary | ICD-10-CM | POA: Diagnosis not present

## 2018-10-26 DIAGNOSIS — N2581 Secondary hyperparathyroidism of renal origin: Secondary | ICD-10-CM | POA: Diagnosis not present

## 2018-10-26 DIAGNOSIS — D631 Anemia in chronic kidney disease: Secondary | ICD-10-CM | POA: Diagnosis not present

## 2018-10-27 ENCOUNTER — Ambulatory Visit: Payer: Medicare Other | Attending: Family Medicine | Admitting: Family Medicine

## 2018-10-27 ENCOUNTER — Other Ambulatory Visit: Payer: Self-pay

## 2018-10-27 ENCOUNTER — Encounter: Payer: Self-pay | Admitting: Family Medicine

## 2018-10-27 VITALS — BP 107/68 | HR 81 | Ht 70.5 in | Wt 228.0 lb

## 2018-10-27 DIAGNOSIS — I252 Old myocardial infarction: Secondary | ICD-10-CM | POA: Diagnosis not present

## 2018-10-27 DIAGNOSIS — G44209 Tension-type headache, unspecified, not intractable: Secondary | ICD-10-CM | POA: Diagnosis not present

## 2018-10-27 DIAGNOSIS — E785 Hyperlipidemia, unspecified: Secondary | ICD-10-CM | POA: Insufficient documentation

## 2018-10-27 DIAGNOSIS — Z992 Dependence on renal dialysis: Secondary | ICD-10-CM | POA: Insufficient documentation

## 2018-10-27 DIAGNOSIS — Z8249 Family history of ischemic heart disease and other diseases of the circulatory system: Secondary | ICD-10-CM | POA: Diagnosis not present

## 2018-10-27 DIAGNOSIS — I509 Heart failure, unspecified: Secondary | ICD-10-CM | POA: Diagnosis not present

## 2018-10-27 DIAGNOSIS — E669 Obesity, unspecified: Secondary | ICD-10-CM | POA: Diagnosis not present

## 2018-10-27 DIAGNOSIS — M069 Rheumatoid arthritis, unspecified: Secondary | ICD-10-CM | POA: Insufficient documentation

## 2018-10-27 DIAGNOSIS — D649 Anemia, unspecified: Secondary | ICD-10-CM | POA: Insufficient documentation

## 2018-10-27 DIAGNOSIS — I25810 Atherosclerosis of coronary artery bypass graft(s) without angina pectoris: Secondary | ICD-10-CM | POA: Insufficient documentation

## 2018-10-27 DIAGNOSIS — I2510 Atherosclerotic heart disease of native coronary artery without angina pectoris: Secondary | ICD-10-CM | POA: Diagnosis not present

## 2018-10-27 DIAGNOSIS — N186 End stage renal disease: Secondary | ICD-10-CM | POA: Diagnosis not present

## 2018-10-27 DIAGNOSIS — E1140 Type 2 diabetes mellitus with diabetic neuropathy, unspecified: Secondary | ICD-10-CM | POA: Diagnosis not present

## 2018-10-27 DIAGNOSIS — M109 Gout, unspecified: Secondary | ICD-10-CM | POA: Diagnosis not present

## 2018-10-27 DIAGNOSIS — Z888 Allergy status to other drugs, medicaments and biological substances status: Secondary | ICD-10-CM | POA: Insufficient documentation

## 2018-10-27 DIAGNOSIS — Z6832 Body mass index (BMI) 32.0-32.9, adult: Secondary | ICD-10-CM | POA: Insufficient documentation

## 2018-10-27 DIAGNOSIS — I132 Hypertensive heart and chronic kidney disease with heart failure and with stage 5 chronic kidney disease, or end stage renal disease: Secondary | ICD-10-CM | POA: Insufficient documentation

## 2018-10-27 DIAGNOSIS — Z955 Presence of coronary angioplasty implant and graft: Secondary | ICD-10-CM | POA: Diagnosis not present

## 2018-10-27 DIAGNOSIS — E1151 Type 2 diabetes mellitus with diabetic peripheral angiopathy without gangrene: Secondary | ICD-10-CM | POA: Insufficient documentation

## 2018-10-27 DIAGNOSIS — E66811 Obesity, class 1: Secondary | ICD-10-CM

## 2018-10-27 DIAGNOSIS — F419 Anxiety disorder, unspecified: Secondary | ICD-10-CM | POA: Insufficient documentation

## 2018-10-27 DIAGNOSIS — R51 Headache: Secondary | ICD-10-CM | POA: Diagnosis not present

## 2018-10-27 DIAGNOSIS — Z9049 Acquired absence of other specified parts of digestive tract: Secondary | ICD-10-CM | POA: Diagnosis not present

## 2018-10-27 DIAGNOSIS — Z8673 Personal history of transient ischemic attack (TIA), and cerebral infarction without residual deficits: Secondary | ICD-10-CM | POA: Insufficient documentation

## 2018-10-27 DIAGNOSIS — Z7982 Long term (current) use of aspirin: Secondary | ICD-10-CM | POA: Diagnosis not present

## 2018-10-27 DIAGNOSIS — E039 Hypothyroidism, unspecified: Secondary | ICD-10-CM | POA: Insufficient documentation

## 2018-10-27 DIAGNOSIS — E1122 Type 2 diabetes mellitus with diabetic chronic kidney disease: Secondary | ICD-10-CM

## 2018-10-27 DIAGNOSIS — Z794 Long term (current) use of insulin: Secondary | ICD-10-CM | POA: Insufficient documentation

## 2018-10-27 DIAGNOSIS — J45909 Unspecified asthma, uncomplicated: Secondary | ICD-10-CM | POA: Diagnosis not present

## 2018-10-27 DIAGNOSIS — Z880 Allergy status to penicillin: Secondary | ICD-10-CM | POA: Diagnosis not present

## 2018-10-27 DIAGNOSIS — Z79890 Hormone replacement therapy (postmenopausal): Secondary | ICD-10-CM | POA: Insufficient documentation

## 2018-10-27 DIAGNOSIS — Z833 Family history of diabetes mellitus: Secondary | ICD-10-CM | POA: Insufficient documentation

## 2018-10-27 DIAGNOSIS — G4700 Insomnia, unspecified: Secondary | ICD-10-CM | POA: Insufficient documentation

## 2018-10-27 LAB — POCT GLYCOSYLATED HEMOGLOBIN (HGB A1C): HbA1c, POC (controlled diabetic range): 5.6 % (ref 0.0–7.0)

## 2018-10-27 LAB — GLUCOSE, POCT (MANUAL RESULT ENTRY): POC Glucose: 129 mg/dl — AB (ref 70–99)

## 2018-10-27 MED ORDER — AMITRIPTYLINE HCL 10 MG PO TABS: 10.0000 mg | ORAL_TABLET | Freq: Every day | ORAL | 1 refills | Status: DC

## 2018-10-27 NOTE — Patient Instructions (Signed)
Tension Headache, Adult A tension headache is pain, pressure, or aching in your head. Tension headaches can last from 30 minutes to several days. Follow these instructions at home: Managing pain  Take over-the-counter and prescription medicines only as told by your doctor.  When you have a headache, lie down in a dark, quiet room.  If told, put ice on your head and neck: ? Put ice in a plastic bag. ? Place a towel between your skin and the bag. ? Leave the ice on for 20 minutes, 2-3 times a day.  If told, put heat on the back of your neck. Do this as often as your doctor tells you to. Use the kind of heat that your doctor recommends, such as a moist heat pack or a heating pad. ? Place a towel between your skin and the heat. ? Leave the heat on for 20-30 minutes. ? Remove the heat if your skin turns bright red. Eating and drinking  Eat meals on a regular schedule.  Watch how much alcohol you drink: ? If you are a woman and are not pregnant, do not drink more than 1 drink a day. ? If you are a man, do not drink more than 2 drinks a day.  Drink enough fluid to keep your pee (urine) pale yellow.  Do not use a lot of caffeine, or stop using caffeine. Lifestyle  Get enough sleep. Get 7-9 hours of sleep each night. Or get the amount of sleep that your doctor tells you to.  At bedtime, remove all electronic devices from your room. Examples of electronic devices are computers, phones, and tablets.  Find ways to lessen your stress. Some things that can lessen stress are: ? Exercise. ? Deep breathing. ? Yoga. ? Music. ? Positive thoughts.  Sit up straight. Do not tighten (tense) your muscles.  Do not use any products that have nicotine or tobacco in them, such as cigarettes and e-cigarettes. If you need help quitting, ask your doctor. General instructions   Keep all follow-up visits as told by your doctor. This is important.  Avoid things that can bring on headaches. Keep a  journal to find out if certain things bring on headaches. For example, write down: ? What you eat and drink. ? How much sleep you get. ? Any change to your diet or medicines. Contact a doctor if:  Your headache does not get better.  Your headache comes back.  You have a headache and sounds, light, or smells bother you.  You feel sick to your stomach (nauseous) or you throw up (vomit).  Your stomach hurts. Get help right away if:  You suddenly get a very bad headache along with any of these: ? A stiff neck. ? Feeling sick to your stomach. ? Throwing up. ? Feeling weak. ? Trouble seeing. ? Feeling short of breath. ? A rash. ? Feeling unusually sleepy. ? Trouble speaking. ? Pain in your eye or ear. ? Trouble walking or balancing. ? Feeling like you will pass out (faint). ? Passing out. Summary  A tension headache is pain, pressure, or aching in your head.  Tension headaches can last from 30 minutes to several days.  Lifestyle changes and medicines may help relieve pain. This information is not intended to replace advice given to you by your health care provider. Make sure you discuss any questions you have with your health care provider. Document Released: 06/26/2009 Document Revised: 03/14/2017 Document Reviewed: 07/12/2016 Elsevier Patient Education  2020 [employer]  

## 2018-10-27 NOTE — Progress Notes (Signed)
Subjective:  Patient ID: Brooke Stafford, female    DOB: [birth year]  Age: 62 y.o. MRN: [id number]  CC: Diabetes   HPI Brooke Stafford is a 63 year old female with a history of type 2 diabetes mellitus (A1c 5.2), hypertension, CHF (2-D echo 35 to 40% from 2-D echo of 04/2016), CAD status post PCI ,End-stage renal disease (currently on hemodialysis on Mondays, Wednesdays and Fridays), peripheral vascular disease (status post left SFA stent placement in 2012 and 2014 and right SFA and popliteal artery angioplasty and stent placement), rheumatoid arthritis here for a follow-up visit. She complains of frontal headaches for the last 1 month which have occurred on most days of the week and occur at night when she is about to go to bed.  This causes insomnia for her.  Headache is worsened by the light, sound and she sometimes feels her neck is tense.  Denies nausea, vomiting or blurry vision.  Compliant with her sinus medications. She has noticed she gained some weight.  She has not been as active due to staying at home as a result of the ongoing COVID-19 pandemic but does have physical therapist to come home to work with her.  Past Medical History:  Diagnosis Date  . Anemia   . Anginal pain ([place])   . Anxiety   . Arthritis    "stiff fingers and knees" (08/04/2013), (12/12/2014)  . Asthma   . CAD (coronary artery disease) 2002; 2015   CABG x 6 2002, cath 2011- med Rx stent DES VG-Diag  . CAD (coronary artery disease) of artery bypass graft; DES to VG-Diag 09/28/13 11/09/2013  . Cataract   . CHF (congestive heart failure) ([place])    "in 2002" (11/26/2012)  . Chronic bronchitis ([place])    "q year; in the winter"   . CKD (chronic kidney disease)    stage 4, followed by [facility]  . Coronary artery disease 2002   CABG x 6. Cath 5/11- med Rx  . Diabetic neuropathy ([place])   . GERD (gastroesophageal reflux disease)   . Gout    "right big toe"  . Headache    "~ q week" (08/04/2013);  "~ twice/month" (12/12/2014)  . History of blood transfusion 2002   "when I had OHS"  . Hyperlipidemia   . Hypertension   . Hypothyroid    treated  . Migraines    "couple times/year" (08/04/2013), (12/12/2014)  . Myocardial infarction ([place]) 2000; 2002; 2011  . Obesity (BMI 35.0-39.9 without comorbidity)   . Peripheral vascular disease ([place]) 12/12   LSFA PTA  . Pneumonia    "3 times I think" (12/12/2014)  . Stroke [facility])    " mini stroke"  . Type II diabetes mellitus ([place])     Past Surgical History:  Procedure Laterality Date  . ABDOMINAL AORTAGRAM N/A 04/05/2011   Procedure: ABDOMINAL AORTAGRAM;  Surgeon: Lorretta Harp, MD;  Location: [facility] CATH LAB;  Service: Cardiovascular;  Laterality: N/A;  . ABDOMINAL AORTOGRAM N/A 10/30/2016   Procedure: Abdominal Aortogram;  Surgeon: Wellington Hampshire, MD;  Location: [place] CV LAB;  Service: Cardiovascular;  Laterality: N/A;  . APPENDECTOMY  1980  . AV FISTULA PLACEMENT Left 05/10/2016   Procedure: LEFT ARM ARTERIOVENOUS (AV) FISTULA CREATION;  Surgeon: Rosetta Posner, MD;  Location: [place];  Service: Vascular;  Laterality: Left;  . BASCILIC VEIN TRANSPOSITION Left 06/26/2016   Procedure: SECOND STAGE BASILIC VEIN TRANSPOSITION;  Surgeon: Rosetta Posner, MD;  Location: [facility]  OR;  Service: Vascular;  Laterality: Left;  . BREAST CYST EXCISION Right 1970's  . CARDIAC CATHETERIZATION  2002  . CARDIAC CATHETERIZATION N/A 12/12/2014   Procedure: Left Heart Cath and Cors/Grafts Angiography;  Surgeon: Peter M Martinique, MD;  Location: [place] CV LAB;  Service: Cardiovascular;  Laterality: N/A;  . CARDIAC CATHETERIZATION N/A 12/12/2014   Procedure: Coronary Stent Intervention;  Surgeon: Peter M Martinique, MD;  Location: [place] CV LAB;  Service: Cardiovascular;  Laterality: N/A;  . CATARACT EXTRACTION, BILATERAL    . CESAREAN SECTION  1978; 1980  . CHOLECYSTECTOMY  1982  . COLONOSCOPY WITH PROPOFOL N/A 11/03/2017   Procedure: COLONOSCOPY WITH PROPOFOL;   Surgeon: Doran Stabler, MD;  Location: [facility] ENDOSCOPY;  Service: Gastroenterology;  Laterality: N/A;  . CORONARY ANGIOPLASTY WITH STENT PLACEMENT  2004; 2012   "I have 2 stents" (08/04/2013)  . CORONARY ANGIOPLASTY WITH STENT PLACEMENT  09/28/13   PTCA/ DES Xience stent to VG-Diag   . CORONARY ARTERY BYPASS GRAFT  11/20/2000   x6 LIMA to distal LAD, svg to first diag, svg to ramus intermediate branch and swquential SVG to cir marginal branch, SVG to posterior descending coronary and sequential SVG to first right posterolateral branch  . eye injections    . INSERTION OF DIALYSIS CATHETER Right 05/10/2016   Procedure: INSERTION OF DIALYSIS CATHETER - Right Internal Jugular Placement;  Surgeon: Rosetta Posner, MD;  Location: [place];  Service: Vascular;  Laterality: Right;  . LEFT HEART CATHETERIZATION WITH CORONARY/GRAFT ANGIOGRAM N/A 09/28/2013   Procedure: LEFT HEART CATHETERIZATION WITH Beatrix Fetters;  Surgeon: Peter M Martinique, MD;  Location[facility] CATH LAB;  Service: Cardiovascular;  Laterality: N/A;  . LOWER EXTREMITY ANGIOGRAM  12/01/2012   Procedure: LOWER EXTREMITY ANGIOGRAM;  Surgeon: Lorretta Harp, MD;  Location: [facility] CATH LAB;  Service: Cardiovascular;;  . LOWER EXTREMITY ANGIOGRAPHY Right 10/30/2016   Procedure: Lower Extremity Angiography;  Surgeon: Wellington Hampshire, MD;  Location: [place] CV LAB;  Service: Cardiovascular;  Laterality: Right;  . NM MYOCAR PERF WALL MOTION  08/27/2004   negative  . PERCUTANEOUS STENT INTERVENTION Left 12/01/2012   Procedure: PERCUTANEOUS STENT INTERVENTION;  Surgeon: Lorretta Harp, MD;  Location: [facility] CATH LAB;  Service: Cardiovascular;  Laterality: Left;  Left SFA  . PERIPHERAL ARTERIAL STENT GRAFT Left    SFA/notes 04/07/2011 (11/30/2012)  . PERIPHERAL VASCULAR ATHERECTOMY Right 10/30/2016   Procedure: Peripheral Vascular Atherectomy;  Surgeon: Wellington Hampshire, MD;  Location: [place] CV LAB;  Service: Cardiovascular;  Laterality: Right;   cancel unable to  . PERIPHERAL VASCULAR INTERVENTION Right 10/30/2016   Procedure: Peripheral Vascular Intervention;  Surgeon: Wellington Hampshire, MD;  Location: [place] CV LAB;  Service: Cardiovascular;  Laterality: Right;  SFA  . POLYPECTOMY  11/03/2017   Procedure: POLYPECTOMY;  Surgeon: Doran Stabler, MD;  Location: [facility];  Service: Gastroenterology;;  . RENAL ANGIOGRAM N/A 04/05/2011   Procedure: RENAL ANGIOGRAM;  Surgeon: Lorretta Harp, MD;  Location: [facility] CATH LAB;  Service: Cardiovascular;  Laterality: N/A;  . TUBAL LIGATION  1980    Family History  Problem Relation Age of Onset  . Diabetes Mother   . Hypertension Mother   . Stroke Mother   . Hypertension Father   . Hypertension Brother   . Hypertension Sister   . Diabetes Sister   . Hyperlipidemia Sister     Allergies  Allergen Reactions  . Digoxin And Related Diarrhea and Other (See Comments)  TOXIC DRUG LEVELS Patient stated she almost died. Had flu like symptoms as well as diarrhea.  . Hydralazine Shortness Of Breath  . Penicillins Cross Reactors Hives and Other (See Comments)    HIGH FEVER  Has patient had a PCN reaction causing immediate rash, facial/tongue/throat swelling, SOB or lightheadedness with hypotension: No Has patient had a PCN reaction causing SEVERE RASH INVOLVING MUCUS MEMBRANES or SKIN NECROSIS  #  #  #  YES  #  #  #  Has patient had a PCN reaction that required hospitalization: Unk Has patient had a PCN reaction occurring within the last 10 years: Unk If all of the above answers are "NO", then may proceed with Cephalosporin use.   [name] Lisinopril Other (See Comments)    Felt like she had the flu. Was very sick!!!  . Adhesive [Tape] Rash and Other (See Comments)    bruising    Outpatient Medications Prior to Visit  Medication Sig Dispense Refill  . Alcohol Swabs (ALCOHOL PREP) PADS     . allopurinol (ZYLOPRIM) 300 MG tablet TAKE 1 TABLET(300 MG) BY MOUTH DAILY 90 tablet 1  .  aspirin 81 MG chewable tablet Chew 1 tablet (81 mg total) by mouth daily. 30 tablet 10  . atorvastatin (LIPITOR) 40 MG tablet TAKE 1 TABLET(40 MG) BY MOUTH EVERY EVENING 90 tablet 3  . AURYXIA 1 GM 210 MG(Fe) tablet TAKE 2 TABLETS BY MOUTH THREE TIMES A DAY WITH MEALS.   SWALLOW WHOLE, DO NOT CHEW OR CRUSH MEDICATION  1  . Blood Glucose Monitoring Suppl (ACCU-CHEK AVIVA CONNECT) w/Device KIT 16 Units by Does not apply route daily. 1 kit 0  . busPIRone (BUSPAR) 10 MG tablet Take 1 tablet (10 mg total) by mouth 2 (two) times daily. 180 tablet 1  . ciprofloxacin (CIPRO) 500 MG tablet Take 1 tablet (500 mg total) by mouth daily with breakfast. Administer after dialysis on hemodialysis days. 7 tablet 0  . clopidogrel (PLAVIX) 75 MG tablet Take 1 tablet (75 mg total) by mouth daily. 90 tablet 1  . fluticasone (FLONASE) 50 MCG/ACT nasal spray Place 1 spray into both nostrils daily. (Patient taking differently: Place 2 sprays into both nostrils daily. ) 48 g 2  . gabapentin (NEURONTIN) 400 MG capsule Take 1 capsule (400 mg total) by mouth 2 (two) times daily. 180 capsule 1  . glucose blood (ACCU-CHEK ACTIVE STRIPS) test strip Use as instructed 100 each 12  . hydroxychloroquine (PLAQUENIL) 200 MG tablet TAKE 2 TABLETS BY MOUTH EVERY DAY WITH FOOD OR MILK  0  . hydrOXYzine (ATARAX/VISTARIL) 25 MG tablet Take 0.5 tablets (12.5 mg total) by mouth daily as needed. (Patient taking differently: Take 12.5 mg by mouth daily as needed for anxiety. ) 30 tablet 0  . insulin aspart (NOVOLOG FLEXPEN) 100 UNIT/ML FlexPen Inject 5-8 Units into the skin 3 (three) times daily with meals. 15 mL 11  . Insulin Glargine (BASAGLAR KWIKPEN) 100 UNIT/ML SOPN INJECT 10 UNITS INTO THE SKIN AT BEDTIME 45 mL 3  . Insulin Pen Needle 32G X 4 MM MISC Use 4x a day 200 each 3  . ipratropium (ATROVENT) 0.03 % nasal spray Place 2 sprays into both nostrils every 12 (twelve) hours. (Patient taking differently: Place 2 sprays into both nostrils 2  (two) times daily as needed for rhinitis. ) 30 mL 6  . isosorbide mononitrate (IMDUR) 60 MG 24 hr tablet TAKE 2 TABLETS(120 MG) BY MOUTH DAILY 180 tablet 1  . Lancets (ACCU-CHEK SOFT  TOUCH) lancets Use as instructed 100 each 12  . levothyroxine (SYNTHROID) 25 MCG tablet Take 1 tablet (25 mcg total) by mouth daily before breakfast. 90 tablet 1  . lidocaine-prilocaine (EMLA) cream Apply 1 application topically as needed (for port access).   4  . LINZESS 145 MCG CAPS capsule TAKE 1 CAPSULE(145 MCG) BY MOUTH DAILY BEFORE BREAKFAST 30 capsule 0  . metoCLOPramide (REGLAN) 5 MG tablet Take 1 tablet (5 mg total) by mouth every 8 (eight) hours as needed for nausea. 90 tablet 3  . metoprolol tartrate (LOPRESSOR) 25 MG tablet     . multivitamin (RENA-VIT) TABS tablet Take 1 tablet by mouth daily.    . Na Sulfate-K Sulfate-Mg Sulf 17.5-3.13-1.6 GM/177ML SOLN Suprep-Use as directed 354 mL 0  . nitroGLYCERIN (NITROSTAT) 0.4 MG SL tablet Place 1 tablet (0.4 mg total) under the tongue every 5 (five) minutes as needed. 30 tablet 2  . NOVOLOG FLEXPEN 100 UNIT/ML FlexPen ADMINISTER 5 UNITS UNDER THE SKIN THREE TIMES DAILY WITH MEALS 15 mL 0  . ORENCIA CLICKJECT 283 MG/ML SOAJ     . RENVELA 800 MG tablet TAKE 3 TABLETS BY MOUTH THREE TIMES DAILY WITH MEALS  3  . sevelamer (RENAGEL) 800 MG tablet Take 2,400 mg by mouth 3 (three) times daily with meals.    . traMADol (ULTRAM) 50 MG tablet Take 1 tablet (50 mg total) by mouth every 12 (twelve) hours as needed for moderate pain. 40 tablet 1  . triamcinolone cream (KENALOG) 0.1 % Apply 1 application topically 2 (two) times daily. 45 g 1  . cetirizine (ZYRTEC) 10 MG tablet Take 10 mg by mouth daily.     No facility-administered medications prior to visit.      ROS Review of Systems  Constitutional: Negative for activity change, appetite change and fatigue.  HENT: Negative for congestion, sinus pressure and sore throat.   Eyes: Negative for visual disturbance.   Respiratory: Negative for cough, chest tightness, shortness of breath and wheezing.   Cardiovascular: Negative for chest pain and palpitations.  Gastrointestinal: Negative for abdominal distention, abdominal pain and constipation.  Endocrine: Negative for polydipsia.  Genitourinary: Negative for dysuria and frequency.  Musculoskeletal: Negative for arthralgias and back pain.  Skin: Negative for rash.  Neurological: Negative for tremors, light-headedness and numbness.  Hematological: Does not bruise/bleed easily.  Psychiatric/Behavioral: Negative for agitation and behavioral problems.    Objective:  BP 107/68   Pulse 81   Ht 5' 10.5" (1.791 m)   Wt 228 lb (103.4 kg)   LMP 10/13/2014 (Exact Date)   SpO2 98%   BMI 32.25 kg/m   BP/Weight 10/27/2018 06/23/2018 1/51/7616  Systolic BP 073 710 626  Diastolic BP 68 70 62  Wt. (Lbs) 228 225.8 221.2  BMI 32.25 31.94 31.29      Physical Exam Constitutional:      Appearance: She is well-developed.  Cardiovascular:     Rate and Rhythm: Normal rate.     Heart sounds: Normal heart sounds. No murmur.  Pulmonary:     Effort: Pulmonary effort is normal.     Breath sounds: Normal breath sounds. No wheezing or rales.  Chest:     Chest wall: No tenderness.  Abdominal:     General: Bowel sounds are normal. There is no distension.     Palpations: Abdomen is soft. There is no mass.     Tenderness: There is no abdominal tenderness.  Musculoskeletal: Normal range of motion.  Neurological:  Mental Status: She is alert and oriented to person, place, and time.     CMP Latest Ref Rng & Units 08/20/2018 11/03/2017 03/26/2017  Glucose 65 - 99 mg/dL 136(H) 98 179(H)  BUN 8 - 27 mg/dL 19 - 18  Creatinine 0.57 - 1.00 mg/dL 7.15(H) - 6.28(H)  Sodium 134 - 144 mmol/L 141 138 136  Potassium 3.5 - 5.2 mmol/L 4.1 4.1 3.3(L)  Chloride 96 - 106 mmol/L 92(L) - 92(L)  CO2 20 - 29 mmol/L 27 - 31  Calcium 8.7 - 10.3 mg/dL 9.7 - 8.7(L)  Total Protein 6.0  - 8.5 g/dL 7.8 - -  Total Bilirubin 0.0 - 1.2 mg/dL 0.4 - -  Alkaline Phos 39 - 117 IU/L 93 - -  AST 0 - 40 IU/L 20 - -  ALT 0 - 32 IU/L 17 - -    Lipid Panel     Component Value Date/Time   CHOL 112 08/20/2018 1001   TRIG 117 08/20/2018 1001   HDL 52 08/20/2018 1001   CHOLHDL 2.2 08/20/2018 1001   CHOLHDL 3.6 07/25/2015 2037   VLDL 44 (H) 07/25/2015 2037   LDLCALC 37 08/20/2018 1001    CBC    Component Value Date/Time   WBC 6.1 03/26/2017 2216   RBC 3.64 (L) 03/26/2017 2216   HGB 10.5 (L) 11/03/2017 1003   HGB 11.5 10/22/2016 1151   HCT 31.0 (L) 11/03/2017 1003   HCT 35.2 10/22/2016 1151   PLT 169 03/26/2017 2216   PLT 182 10/22/2016 1151   MCV 102.5 (H) 03/26/2017 2216   MCV 101 (H) 10/22/2016 1151   MCH 33.8 03/26/2017 2216   MCHC 33.0 03/26/2017 2216   RDW 14.8 03/26/2017 2216   RDW 14.9 10/22/2016 1151   LYMPHSABS 2.5 05/02/2016 2145   MONOABS 0.3 05/02/2016 2145   EOSABS 0.6 05/02/2016 2145   BASOSABS 0.0 05/02/2016 2145    Lab Results  Component Value Date   HGBA1C 5.6 10/27/2018    Assessment & Plan:   1. Type 2 diabetes mellitus with ESRD (end-stage renal disease) ([place]) Controlled with A1c of 5.6 Continue current management, diabetic diet Managed by endocrine - POCT glucose (manual entry) - POCT glycosylated hemoglobin (Hb A1C)  2. Acute non intractable tension-type headache Likely tension headache but she does have some symptoms suspicious for migraines We have discussed side effects of Elavil and she is willing to commence this as it will also help with insomnia - amitriptyline (ELAVIL) 10 MG tablet; Take 1 tablet (10 mg total) by mouth at bedtime.  Dispense: 30 tablet; Refill: 1  3. Obesity (BMI 30.0-34.9) Recent weight gain Likely due to sedentary habits as a result of ongoing pandemic Increase physical activity, reduce portion sizes and avoid late meals    Meds ordered this encounter  Medications  . amitriptyline (ELAVIL) 10 MG  tablet    Sig: Take 1 tablet (10 mg total) by mouth at bedtime.    Dispense:  30 tablet    Refill:  1    Follow-up: Return in about 3 months (around 01/27/2019) for medical conditions.       Charlott Rakes, MD, FAAFP. [facility] [place], [place]   10/27/2018, 6:37 PM

## 2018-10-27 NOTE — Progress Notes (Signed)
Patient states that she has been having headaches off and on.

## 2018-10-28 ENCOUNTER — Telehealth: Payer: Self-pay | Admitting: *Deleted

## 2018-10-28 DIAGNOSIS — E1129 Type 2 diabetes mellitus with other diabetic kidney complication: Secondary | ICD-10-CM | POA: Diagnosis not present

## 2018-10-28 DIAGNOSIS — D631 Anemia in chronic kidney disease: Secondary | ICD-10-CM | POA: Diagnosis not present

## 2018-10-28 DIAGNOSIS — N2581 Secondary hyperparathyroidism of renal origin: Secondary | ICD-10-CM | POA: Diagnosis not present

## 2018-10-28 DIAGNOSIS — N186 End stage renal disease: Secondary | ICD-10-CM | POA: Diagnosis not present

## 2018-10-28 NOTE — Telephone Encounter (Signed)
A message was left, re: follow up visit. 

## 2018-10-30 DIAGNOSIS — N2581 Secondary hyperparathyroidism of renal origin: Secondary | ICD-10-CM | POA: Diagnosis not present

## 2018-10-30 DIAGNOSIS — E1129 Type 2 diabetes mellitus with other diabetic kidney complication: Secondary | ICD-10-CM | POA: Diagnosis not present

## 2018-10-30 DIAGNOSIS — D631 Anemia in chronic kidney disease: Secondary | ICD-10-CM | POA: Diagnosis not present

## 2018-10-30 DIAGNOSIS — N186 End stage renal disease: Secondary | ICD-10-CM | POA: Diagnosis not present

## 2018-11-02 DIAGNOSIS — N186 End stage renal disease: Secondary | ICD-10-CM | POA: Diagnosis not present

## 2018-11-02 DIAGNOSIS — N2581 Secondary hyperparathyroidism of renal origin: Secondary | ICD-10-CM | POA: Diagnosis not present

## 2018-11-02 DIAGNOSIS — D631 Anemia in chronic kidney disease: Secondary | ICD-10-CM | POA: Diagnosis not present

## 2018-11-02 DIAGNOSIS — E1129 Type 2 diabetes mellitus with other diabetic kidney complication: Secondary | ICD-10-CM | POA: Diagnosis not present

## 2018-11-04 DIAGNOSIS — E1129 Type 2 diabetes mellitus with other diabetic kidney complication: Secondary | ICD-10-CM | POA: Diagnosis not present

## 2018-11-04 DIAGNOSIS — N186 End stage renal disease: Secondary | ICD-10-CM | POA: Diagnosis not present

## 2018-11-04 DIAGNOSIS — D631 Anemia in chronic kidney disease: Secondary | ICD-10-CM | POA: Diagnosis not present

## 2018-11-04 DIAGNOSIS — N2581 Secondary hyperparathyroidism of renal origin: Secondary | ICD-10-CM | POA: Diagnosis not present

## 2018-11-06 DIAGNOSIS — N2581 Secondary hyperparathyroidism of renal origin: Secondary | ICD-10-CM | POA: Diagnosis not present

## 2018-11-06 DIAGNOSIS — E1129 Type 2 diabetes mellitus with other diabetic kidney complication: Secondary | ICD-10-CM | POA: Diagnosis not present

## 2018-11-06 DIAGNOSIS — D631 Anemia in chronic kidney disease: Secondary | ICD-10-CM | POA: Diagnosis not present

## 2018-11-06 DIAGNOSIS — N186 End stage renal disease: Secondary | ICD-10-CM | POA: Diagnosis not present

## 2018-11-09 DIAGNOSIS — D631 Anemia in chronic kidney disease: Secondary | ICD-10-CM | POA: Diagnosis not present

## 2018-11-09 DIAGNOSIS — E1129 Type 2 diabetes mellitus with other diabetic kidney complication: Secondary | ICD-10-CM | POA: Diagnosis not present

## 2018-11-09 DIAGNOSIS — N2581 Secondary hyperparathyroidism of renal origin: Secondary | ICD-10-CM | POA: Diagnosis not present

## 2018-11-09 DIAGNOSIS — N186 End stage renal disease: Secondary | ICD-10-CM | POA: Diagnosis not present

## 2018-11-11 DIAGNOSIS — N2581 Secondary hyperparathyroidism of renal origin: Secondary | ICD-10-CM | POA: Diagnosis not present

## 2018-11-11 DIAGNOSIS — N186 End stage renal disease: Secondary | ICD-10-CM | POA: Diagnosis not present

## 2018-11-11 DIAGNOSIS — E1129 Type 2 diabetes mellitus with other diabetic kidney complication: Secondary | ICD-10-CM | POA: Diagnosis not present

## 2018-11-11 DIAGNOSIS — D631 Anemia in chronic kidney disease: Secondary | ICD-10-CM | POA: Diagnosis not present

## 2018-11-13 DIAGNOSIS — D631 Anemia in chronic kidney disease: Secondary | ICD-10-CM | POA: Diagnosis not present

## 2018-11-13 DIAGNOSIS — N2581 Secondary hyperparathyroidism of renal origin: Secondary | ICD-10-CM | POA: Diagnosis not present

## 2018-11-13 DIAGNOSIS — N186 End stage renal disease: Secondary | ICD-10-CM | POA: Diagnosis not present

## 2018-11-13 DIAGNOSIS — E1129 Type 2 diabetes mellitus with other diabetic kidney complication: Secondary | ICD-10-CM | POA: Diagnosis not present

## 2018-11-14 DIAGNOSIS — Z992 Dependence on renal dialysis: Secondary | ICD-10-CM | POA: Diagnosis not present

## 2018-11-14 DIAGNOSIS — I129 Hypertensive chronic kidney disease with stage 1 through stage 4 chronic kidney disease, or unspecified chronic kidney disease: Secondary | ICD-10-CM | POA: Diagnosis not present

## 2018-11-14 DIAGNOSIS — N186 End stage renal disease: Secondary | ICD-10-CM | POA: Diagnosis not present

## 2018-11-16 DIAGNOSIS — E1129 Type 2 diabetes mellitus with other diabetic kidney complication: Secondary | ICD-10-CM | POA: Diagnosis not present

## 2018-11-16 DIAGNOSIS — D631 Anemia in chronic kidney disease: Secondary | ICD-10-CM | POA: Diagnosis not present

## 2018-11-16 DIAGNOSIS — N2581 Secondary hyperparathyroidism of renal origin: Secondary | ICD-10-CM | POA: Diagnosis not present

## 2018-11-16 DIAGNOSIS — Z992 Dependence on renal dialysis: Secondary | ICD-10-CM | POA: Diagnosis not present

## 2018-11-16 DIAGNOSIS — N186 End stage renal disease: Secondary | ICD-10-CM | POA: Diagnosis not present

## 2018-11-17 DIAGNOSIS — M62838 Other muscle spasm: Secondary | ICD-10-CM | POA: Diagnosis not present

## 2018-11-17 DIAGNOSIS — M2550 Pain in unspecified joint: Secondary | ICD-10-CM | POA: Diagnosis not present

## 2018-11-17 DIAGNOSIS — E669 Obesity, unspecified: Secondary | ICD-10-CM | POA: Diagnosis not present

## 2018-11-17 DIAGNOSIS — Z6835 Body mass index (BMI) 35.0-35.9, adult: Secondary | ICD-10-CM | POA: Diagnosis not present

## 2018-11-17 DIAGNOSIS — M0579 Rheumatoid arthritis with rheumatoid factor of multiple sites without organ or systems involvement: Secondary | ICD-10-CM | POA: Diagnosis not present

## 2018-11-17 DIAGNOSIS — M109 Gout, unspecified: Secondary | ICD-10-CM | POA: Diagnosis not present

## 2018-11-17 DIAGNOSIS — Z79899 Other long term (current) drug therapy: Secondary | ICD-10-CM | POA: Diagnosis not present

## 2018-11-17 DIAGNOSIS — N186 End stage renal disease: Secondary | ICD-10-CM | POA: Diagnosis not present

## 2018-11-17 DIAGNOSIS — M25431 Effusion, right wrist: Secondary | ICD-10-CM | POA: Diagnosis not present

## 2018-11-18 DIAGNOSIS — Z992 Dependence on renal dialysis: Secondary | ICD-10-CM | POA: Diagnosis not present

## 2018-11-18 DIAGNOSIS — N2581 Secondary hyperparathyroidism of renal origin: Secondary | ICD-10-CM | POA: Diagnosis not present

## 2018-11-18 DIAGNOSIS — D631 Anemia in chronic kidney disease: Secondary | ICD-10-CM | POA: Diagnosis not present

## 2018-11-18 DIAGNOSIS — N186 End stage renal disease: Secondary | ICD-10-CM | POA: Diagnosis not present

## 2018-11-18 DIAGNOSIS — E1129 Type 2 diabetes mellitus with other diabetic kidney complication: Secondary | ICD-10-CM | POA: Diagnosis not present

## 2018-11-20 DIAGNOSIS — E1129 Type 2 diabetes mellitus with other diabetic kidney complication: Secondary | ICD-10-CM | POA: Diagnosis not present

## 2018-11-20 DIAGNOSIS — N2581 Secondary hyperparathyroidism of renal origin: Secondary | ICD-10-CM | POA: Diagnosis not present

## 2018-11-20 DIAGNOSIS — N186 End stage renal disease: Secondary | ICD-10-CM | POA: Diagnosis not present

## 2018-11-20 DIAGNOSIS — Z992 Dependence on renal dialysis: Secondary | ICD-10-CM | POA: Diagnosis not present

## 2018-11-20 DIAGNOSIS — D631 Anemia in chronic kidney disease: Secondary | ICD-10-CM | POA: Diagnosis not present

## 2018-11-23 DIAGNOSIS — E1129 Type 2 diabetes mellitus with other diabetic kidney complication: Secondary | ICD-10-CM | POA: Diagnosis not present

## 2018-11-23 DIAGNOSIS — Z992 Dependence on renal dialysis: Secondary | ICD-10-CM | POA: Diagnosis not present

## 2018-11-23 DIAGNOSIS — N2581 Secondary hyperparathyroidism of renal origin: Secondary | ICD-10-CM | POA: Diagnosis not present

## 2018-11-23 DIAGNOSIS — D631 Anemia in chronic kidney disease: Secondary | ICD-10-CM | POA: Diagnosis not present

## 2018-11-23 DIAGNOSIS — N186 End stage renal disease: Secondary | ICD-10-CM | POA: Diagnosis not present

## 2018-11-25 DIAGNOSIS — N2581 Secondary hyperparathyroidism of renal origin: Secondary | ICD-10-CM | POA: Diagnosis not present

## 2018-11-25 DIAGNOSIS — N186 End stage renal disease: Secondary | ICD-10-CM | POA: Diagnosis not present

## 2018-11-25 DIAGNOSIS — D631 Anemia in chronic kidney disease: Secondary | ICD-10-CM | POA: Diagnosis not present

## 2018-11-25 DIAGNOSIS — Z992 Dependence on renal dialysis: Secondary | ICD-10-CM | POA: Diagnosis not present

## 2018-11-25 DIAGNOSIS — E1129 Type 2 diabetes mellitus with other diabetic kidney complication: Secondary | ICD-10-CM | POA: Diagnosis not present

## 2018-11-26 DIAGNOSIS — L299 Pruritus, unspecified: Secondary | ICD-10-CM | POA: Diagnosis not present

## 2018-11-26 DIAGNOSIS — L910 Hypertrophic scar: Secondary | ICD-10-CM | POA: Diagnosis not present

## 2018-11-27 DIAGNOSIS — Z992 Dependence on renal dialysis: Secondary | ICD-10-CM | POA: Diagnosis not present

## 2018-11-27 DIAGNOSIS — N186 End stage renal disease: Secondary | ICD-10-CM | POA: Diagnosis not present

## 2018-11-27 DIAGNOSIS — D631 Anemia in chronic kidney disease: Secondary | ICD-10-CM | POA: Diagnosis not present

## 2018-11-27 DIAGNOSIS — E1129 Type 2 diabetes mellitus with other diabetic kidney complication: Secondary | ICD-10-CM | POA: Diagnosis not present

## 2018-11-27 DIAGNOSIS — N2581 Secondary hyperparathyroidism of renal origin: Secondary | ICD-10-CM | POA: Diagnosis not present

## 2018-11-28 ENCOUNTER — Other Ambulatory Visit: Payer: Self-pay | Admitting: Family Medicine

## 2018-11-30 DIAGNOSIS — D631 Anemia in chronic kidney disease: Secondary | ICD-10-CM | POA: Diagnosis not present

## 2018-11-30 DIAGNOSIS — Z992 Dependence on renal dialysis: Secondary | ICD-10-CM | POA: Diagnosis not present

## 2018-11-30 DIAGNOSIS — N2581 Secondary hyperparathyroidism of renal origin: Secondary | ICD-10-CM | POA: Diagnosis not present

## 2018-11-30 DIAGNOSIS — E1129 Type 2 diabetes mellitus with other diabetic kidney complication: Secondary | ICD-10-CM | POA: Diagnosis not present

## 2018-11-30 DIAGNOSIS — N186 End stage renal disease: Secondary | ICD-10-CM | POA: Diagnosis not present

## 2018-12-02 DIAGNOSIS — N2581 Secondary hyperparathyroidism of renal origin: Secondary | ICD-10-CM | POA: Diagnosis not present

## 2018-12-02 DIAGNOSIS — D631 Anemia in chronic kidney disease: Secondary | ICD-10-CM | POA: Diagnosis not present

## 2018-12-02 DIAGNOSIS — N186 End stage renal disease: Secondary | ICD-10-CM | POA: Diagnosis not present

## 2018-12-02 DIAGNOSIS — Z992 Dependence on renal dialysis: Secondary | ICD-10-CM | POA: Diagnosis not present

## 2018-12-02 DIAGNOSIS — E1129 Type 2 diabetes mellitus with other diabetic kidney complication: Secondary | ICD-10-CM | POA: Diagnosis not present

## 2018-12-04 DIAGNOSIS — E1129 Type 2 diabetes mellitus with other diabetic kidney complication: Secondary | ICD-10-CM | POA: Diagnosis not present

## 2018-12-04 DIAGNOSIS — D631 Anemia in chronic kidney disease: Secondary | ICD-10-CM | POA: Diagnosis not present

## 2018-12-04 DIAGNOSIS — N2581 Secondary hyperparathyroidism of renal origin: Secondary | ICD-10-CM | POA: Diagnosis not present

## 2018-12-04 DIAGNOSIS — Z992 Dependence on renal dialysis: Secondary | ICD-10-CM | POA: Diagnosis not present

## 2018-12-04 DIAGNOSIS — N186 End stage renal disease: Secondary | ICD-10-CM | POA: Diagnosis not present

## 2018-12-07 DIAGNOSIS — D631 Anemia in chronic kidney disease: Secondary | ICD-10-CM | POA: Diagnosis not present

## 2018-12-07 DIAGNOSIS — N186 End stage renal disease: Secondary | ICD-10-CM | POA: Diagnosis not present

## 2018-12-07 DIAGNOSIS — E1129 Type 2 diabetes mellitus with other diabetic kidney complication: Secondary | ICD-10-CM | POA: Diagnosis not present

## 2018-12-07 DIAGNOSIS — Z992 Dependence on renal dialysis: Secondary | ICD-10-CM | POA: Diagnosis not present

## 2018-12-07 DIAGNOSIS — N2581 Secondary hyperparathyroidism of renal origin: Secondary | ICD-10-CM | POA: Diagnosis not present

## 2018-12-08 ENCOUNTER — Other Ambulatory Visit: Payer: Self-pay | Admitting: *Deleted

## 2018-12-08 NOTE — Patient Outreach (Signed)
[facility]) Care Management  12/08/2018  AUNDRIA BITTERMAN 1955/06/12 [id number]   RN Health Coach attempted follow up outreach call to patient.  Patient was unavailable. HIPPA compliance voicemail message left with return callback number.  Plan: RN will call patient again within 30 days.  [place] [facility] [phone]

## 2018-12-10 DIAGNOSIS — Z992 Dependence on renal dialysis: Secondary | ICD-10-CM | POA: Diagnosis not present

## 2018-12-10 DIAGNOSIS — T82898A Other specified complication of vascular prosthetic devices, implants and grafts, initial encounter: Secondary | ICD-10-CM | POA: Diagnosis not present

## 2018-12-10 DIAGNOSIS — D631 Anemia in chronic kidney disease: Secondary | ICD-10-CM | POA: Diagnosis not present

## 2018-12-10 DIAGNOSIS — N186 End stage renal disease: Secondary | ICD-10-CM | POA: Diagnosis not present

## 2018-12-10 DIAGNOSIS — N2581 Secondary hyperparathyroidism of renal origin: Secondary | ICD-10-CM | POA: Diagnosis not present

## 2018-12-10 DIAGNOSIS — I871 Compression of vein: Secondary | ICD-10-CM | POA: Diagnosis not present

## 2018-12-10 DIAGNOSIS — E1129 Type 2 diabetes mellitus with other diabetic kidney complication: Secondary | ICD-10-CM | POA: Diagnosis not present

## 2018-12-11 DIAGNOSIS — D631 Anemia in chronic kidney disease: Secondary | ICD-10-CM | POA: Diagnosis not present

## 2018-12-11 DIAGNOSIS — Z992 Dependence on renal dialysis: Secondary | ICD-10-CM | POA: Diagnosis not present

## 2018-12-11 DIAGNOSIS — E1129 Type 2 diabetes mellitus with other diabetic kidney complication: Secondary | ICD-10-CM | POA: Diagnosis not present

## 2018-12-11 DIAGNOSIS — N2581 Secondary hyperparathyroidism of renal origin: Secondary | ICD-10-CM | POA: Diagnosis not present

## 2018-12-11 DIAGNOSIS — N186 End stage renal disease: Secondary | ICD-10-CM | POA: Diagnosis not present

## 2018-12-13 ENCOUNTER — Other Ambulatory Visit: Payer: Self-pay | Admitting: Internal Medicine

## 2018-12-14 DIAGNOSIS — N2581 Secondary hyperparathyroidism of renal origin: Secondary | ICD-10-CM | POA: Diagnosis not present

## 2018-12-14 DIAGNOSIS — E1129 Type 2 diabetes mellitus with other diabetic kidney complication: Secondary | ICD-10-CM | POA: Diagnosis not present

## 2018-12-14 DIAGNOSIS — Z992 Dependence on renal dialysis: Secondary | ICD-10-CM | POA: Diagnosis not present

## 2018-12-14 DIAGNOSIS — D631 Anemia in chronic kidney disease: Secondary | ICD-10-CM | POA: Diagnosis not present

## 2018-12-14 DIAGNOSIS — N186 End stage renal disease: Secondary | ICD-10-CM | POA: Diagnosis not present

## 2018-12-15 ENCOUNTER — Other Ambulatory Visit: Payer: Self-pay

## 2018-12-15 ENCOUNTER — Ambulatory Visit (INDEPENDENT_AMBULATORY_CARE_PROVIDER_SITE_OTHER): Payer: Medicare Other | Admitting: Podiatry

## 2018-12-15 ENCOUNTER — Encounter: Payer: Self-pay | Admitting: Podiatry

## 2018-12-15 DIAGNOSIS — B351 Tinea unguium: Secondary | ICD-10-CM

## 2018-12-15 DIAGNOSIS — I129 Hypertensive chronic kidney disease with stage 1 through stage 4 chronic kidney disease, or unspecified chronic kidney disease: Secondary | ICD-10-CM | POA: Diagnosis not present

## 2018-12-15 DIAGNOSIS — L84 Corns and callosities: Secondary | ICD-10-CM

## 2018-12-15 DIAGNOSIS — E1142 Type 2 diabetes mellitus with diabetic polyneuropathy: Secondary | ICD-10-CM | POA: Diagnosis not present

## 2018-12-15 DIAGNOSIS — M79674 Pain in right toe(s): Secondary | ICD-10-CM

## 2018-12-15 DIAGNOSIS — Z992 Dependence on renal dialysis: Secondary | ICD-10-CM | POA: Diagnosis not present

## 2018-12-15 DIAGNOSIS — N186 End stage renal disease: Secondary | ICD-10-CM | POA: Diagnosis not present

## 2018-12-15 DIAGNOSIS — M79675 Pain in left toe(s): Secondary | ICD-10-CM

## 2018-12-15 NOTE — Patient Instructions (Signed)
Diabetes Mellitus and Foot Care Foot care is an important part of your health, especially when you have diabetes. Diabetes may cause you to have problems because of poor blood flow (circulation) to your feet and legs, which can cause your skin to:  Become thinner and drier.  Break more easily.  Heal more slowly.  Peel and crack. You may also have nerve damage (neuropathy) in your legs and feet, causing decreased feeling in them. This means that you may not notice minor injuries to your feet that could lead to more serious problems. Noticing and addressing any potential problems early is the best way to prevent future foot problems. How to care for your feet Foot hygiene  Wash your feet daily with warm water and mild soap. Do not use hot water. Then, pat your feet and the areas between your toes until they are completely dry. Do not soak your feet as this can dry your skin.  Trim your toenails straight across. Do not dig under them or around the cuticle. File the edges of your nails with an emery board or nail file.  Apply a moisturizing lotion or petroleum jelly to the skin on your feet and to dry, brittle toenails. Use lotion that does not contain alcohol and is unscented. Do not apply lotion between your toes. Shoes and socks  Wear clean socks or stockings every day. Make sure they are not too tight. Do not wear knee-high stockings since they may decrease blood flow to your legs.  Wear shoes that fit properly and have enough cushioning. Always look in your shoes before you put them on to be sure there are no objects inside.  To break in new shoes, wear them for just a few hours a day. This prevents injuries on your feet. Wounds, scrapes, corns, and calluses  Check your feet daily for blisters, cuts, bruises, sores, and redness. If you cannot see the bottom of your feet, use a mirror or ask someone for help.  Do not cut corns or calluses or try to remove them with medicine.  If you  find a minor scrape, cut, or break in the skin on your feet, keep it and the skin around it clean and dry. You may clean these areas with mild soap and water. Do not clean the area with peroxide, alcohol, or iodine.  If you have a wound, scrape, corn, or callus on your foot, look at it several times a day to make sure it is healing and not infected. Check for: ? Redness, swelling, or pain. ? Fluid or blood. ? Warmth. ? Pus or a bad smell. General instructions  Do not cross your legs. This may decrease blood flow to your feet.  Do not use heating pads or hot water bottles on your feet. They may burn your skin. If you have lost feeling in your feet or legs, you may not know this is happening until it is too late.  Protect your feet from hot and cold by wearing shoes, such as at the beach or on hot pavement.  Schedule a complete foot exam at least once a year (annually) or more often if you have foot problems. If you have foot problems, report any cuts, sores, or bruises to your health care provider immediately. Contact a health care provider if:  You have a medical condition that increases your risk of infection and you have any cuts, sores, or bruises on your feet.  You have an injury that is not   healing.  You have redness on your legs or feet.  You feel burning or tingling in your legs or feet.  You have pain or cramps in your legs and feet.  Your legs or feet are numb.  Your feet always feel cold.  You have pain around a toenail. Get help right away if:  You have a wound, scrape, corn, or callus on your foot and: ? You have pain, swelling, or redness that gets worse. ? You have fluid or blood coming from the wound, scrape, corn, or callus. ? Your wound, scrape, corn, or callus feels warm to the touch. ? You have pus or a bad smell coming from the wound, scrape, corn, or callus. ? You have a fever. ? You have a red line going up your leg. Summary  Check your feet every day  for cuts, sores, red spots, swelling, and blisters.  Moisturize feet and legs daily.  Wear shoes that fit properly and have enough cushioning.  If you have foot problems, report any cuts, sores, or bruises to your health care provider immediately.  Schedule a complete foot exam at least once a year (annually) or more often if you have foot problems. This information is not intended to replace advice given to you by your health care provider. Make sure you discuss any questions you have with your health care provider. Document Released: 03/29/2000 Document Revised: 05/14/2017 Document Reviewed: 05/03/2016 Elsevier Patient Education  2020 [employer] Inc[employer]   Onychomycosis/Fungal Toenails  WHAT IS IT? An infection that lies within the keratin of your nail plate that is caused by a fungus.  WHY ME? Fungal infections affect all ages, sexes, races, and creeds.  There may be many factors that predispose you to a fungal infection such as age, coexisting medical conditions such as diabetes, or an autoimmune disease; stress, medications, fatigue, genetics, etc.  Bottom line: fungus thrives in a warm, moist environment and your shoes offer such a location.  IS IT CONTAGIOUS? Theoretically, yes.  You do not want to share shoes, nail clippers or files with someone who has fungal toenails.  Walking around barefoot in the same room or sleeping in the same bed is unlikely to transfer the organism.  It is important to realize, however, that fungus can spread easily from one nail to the next on the same foot.  HOW DO WE TREAT THIS?  There are several ways to treat this condition.  Treatment may depend on many factors such as age, medications, pregnancy, liver and kidney conditions, etc.  It is best to ask your doctor which options are available to you.  1. No treatment.   Unlike many other medical concerns, you can live with this condition.  However for many people this can be a painful condition and may lead to  ingrown toenails or a bacterial infection.  It is recommended that you keep the nails cut short to help reduce the amount of fungal nail. 2. Topical treatment.  These range from herbal remedies to prescription strength nail lacquers.  About 40-50% effective, topicals require twice daily application for approximately 9 to 12 months or until an entirely new nail has grown out.  The most effective topicals are medical grade medications available through physicians offices. 3. Oral antifungal medications.  With an 80-90% cure rate, the most common oral medication requires 3 to 4 months of therapy and stays in your system for a year as the new nail grows out.  Oral antifungal medications do require   blood work to make sure it is a safe drug for you.  A liver function panel will be performed prior to starting the medication and after the first month of treatment.  It is important to have the blood work performed to avoid any harmful side effects.  In general, this medication safe but blood work is required. 4. Laser Therapy.  This treatment is performed by applying a specialized laser to the affected nail plate.  This therapy is noninvasive, fast, and non-painful.  It is not covered by insurance and is therefore, out of pocket.  The results have been very good with a 80-95% cure rate.  The [facility] is the only practice in the area to offer this therapy. 5. Permanent Nail Avulsion.  Removing the entire nail so that a new nail will not grow back. 

## 2018-12-16 DIAGNOSIS — Z23 Encounter for immunization: Secondary | ICD-10-CM | POA: Diagnosis not present

## 2018-12-16 DIAGNOSIS — N186 End stage renal disease: Secondary | ICD-10-CM | POA: Diagnosis not present

## 2018-12-16 DIAGNOSIS — N2581 Secondary hyperparathyroidism of renal origin: Secondary | ICD-10-CM | POA: Diagnosis not present

## 2018-12-16 DIAGNOSIS — Z992 Dependence on renal dialysis: Secondary | ICD-10-CM | POA: Diagnosis not present

## 2018-12-16 DIAGNOSIS — D509 Iron deficiency anemia, unspecified: Secondary | ICD-10-CM | POA: Diagnosis not present

## 2018-12-16 DIAGNOSIS — D631 Anemia in chronic kidney disease: Secondary | ICD-10-CM | POA: Diagnosis not present

## 2018-12-18 DIAGNOSIS — D509 Iron deficiency anemia, unspecified: Secondary | ICD-10-CM | POA: Diagnosis not present

## 2018-12-18 DIAGNOSIS — Z23 Encounter for immunization: Secondary | ICD-10-CM | POA: Diagnosis not present

## 2018-12-18 DIAGNOSIS — D631 Anemia in chronic kidney disease: Secondary | ICD-10-CM | POA: Diagnosis not present

## 2018-12-18 DIAGNOSIS — N186 End stage renal disease: Secondary | ICD-10-CM | POA: Diagnosis not present

## 2018-12-18 DIAGNOSIS — N2581 Secondary hyperparathyroidism of renal origin: Secondary | ICD-10-CM | POA: Diagnosis not present

## 2018-12-18 DIAGNOSIS — Z992 Dependence on renal dialysis: Secondary | ICD-10-CM | POA: Diagnosis not present

## 2018-12-21 DIAGNOSIS — N186 End stage renal disease: Secondary | ICD-10-CM | POA: Diagnosis not present

## 2018-12-21 DIAGNOSIS — D509 Iron deficiency anemia, unspecified: Secondary | ICD-10-CM | POA: Diagnosis not present

## 2018-12-21 DIAGNOSIS — Z23 Encounter for immunization: Secondary | ICD-10-CM | POA: Diagnosis not present

## 2018-12-21 DIAGNOSIS — D631 Anemia in chronic kidney disease: Secondary | ICD-10-CM | POA: Diagnosis not present

## 2018-12-21 DIAGNOSIS — N2581 Secondary hyperparathyroidism of renal origin: Secondary | ICD-10-CM | POA: Diagnosis not present

## 2018-12-21 DIAGNOSIS — Z992 Dependence on renal dialysis: Secondary | ICD-10-CM | POA: Diagnosis not present

## 2018-12-22 NOTE — Progress Notes (Signed)
Subjective: Brooke Stafford presents with diabetes, diabetic neuropathy and cc of painful, discolored, thick toenails and painful callus/corn which interfere with activities of daily living. Pain is aggravated when wearing enclosed shoe gear. Pain is relieved with periodic professional debridement.  Patient concerned about lesion on plantar 5th met base which she states is new since she received diabetic shoes.  Charlott Rakes, MD is her PCP.    Current Outpatient Medications:  .  Alcohol Swabs (ALCOHOL PREP) PADS, , Disp: , Rfl:  .  allopurinol (ZYLOPRIM) 300 MG tablet, TAKE 1 TABLET(300 MG) BY MOUTH DAILY, Disp: 90 tablet, Rfl: 1 .  amitriptyline (ELAVIL) 10 MG tablet, Take 1 tablet (10 mg total) by mouth at bedtime., Disp: 30 tablet, Rfl: 1 .  aspirin 81 MG chewable tablet, Chew 1 tablet (81 mg total) by mouth daily., Disp: 30 tablet, Rfl: 10 .  atorvastatin (LIPITOR) 40 MG tablet, TAKE 1 TABLET(40 MG) BY MOUTH EVERY EVENING, Disp: 90 tablet, Rfl: 3 .  AURYXIA 1 GM 210 MG(Fe) tablet, TAKE 2 TABLETS BY MOUTH THREE TIMES A DAY WITH MEALS.   SWALLOW WHOLE, DO NOT CHEW OR CRUSH MEDICATION, Disp: , Rfl: 1 .  Blood Glucose Monitoring Suppl (ACCU-CHEK AVIVA CONNECT) w/Device KIT, 16 Units by Does not apply route daily., Disp: 1 kit, Rfl: 0 .  busPIRone (BUSPAR) 10 MG tablet, Take 1 tablet (10 mg total) by mouth 2 (two) times daily., Disp: 180 tablet, Rfl: 1 .  cetirizine (ZYRTEC) 10 MG tablet, Take 10 mg by mouth daily., Disp: , Rfl:  .  ciprofloxacin (CIPRO) 500 MG tablet, Take 1 tablet (500 mg total) by mouth daily with breakfast. Administer after dialysis on hemodialysis days., Disp: 7 tablet, Rfl: 0 .  clopidogrel (PLAVIX) 75 MG tablet, Take 1 tablet (75 mg total) by mouth daily., Disp: 90 tablet, Rfl: 1 .  cyclobenzaprine (FLEXERIL) 10 MG tablet, TK 1 T PO QD HS FOR 14 DAYS PRN, Disp: , Rfl:  .  fluticasone (FLONASE) 50 MCG/ACT nasal spray, PLACE 1 SPRAY INTO BOTH NOSTRILS DAILY, Disp: 16 g,  Rfl: 0 .  gabapentin (NEURONTIN) 400 MG capsule, Take 1 capsule (400 mg total) by mouth 2 (two) times daily., Disp: 180 capsule, Rfl: 1 .  glucose blood (ACCU-CHEK ACTIVE STRIPS) test strip, Use as instructed, Disp: 100 each, Rfl: 12 .  hydroxychloroquine (PLAQUENIL) 200 MG tablet, TAKE 2 TABLETS BY MOUTH EVERY DAY WITH FOOD OR MILK, Disp: , Rfl: 0 .  hydrOXYzine (ATARAX/VISTARIL) 25 MG tablet, Take 0.5 tablets (12.5 mg total) by mouth daily as needed. (Patient taking differently: Take 12.5 mg by mouth daily as needed for anxiety. ), Disp: 30 tablet, Rfl: 0 .  insulin aspart (NOVOLOG FLEXPEN) 100 UNIT/ML FlexPen, Inject 5-8 Units into the skin 3 (three) times daily with meals., Disp: 15 mL, Rfl: 11 .  Insulin Aspart FlexPen 100 UNIT/ML SOPN, ADMINISTER 5 UNITS UNDER THE SKIN THREE TIMES DAILY WITH MEALS, Disp: 15 mL, Rfl: 0 .  Insulin Glargine (BASAGLAR KWIKPEN) 100 UNIT/ML SOPN, INJECT 10 UNITS INTO THE SKIN AT BEDTIME, Disp: 45 mL, Rfl: 3 .  Insulin Pen Needle 32G X 4 MM MISC, Use 4x a day, Disp: 200 each, Rfl: 3 .  ipratropium (ATROVENT) 0.03 % nasal spray, Place 2 sprays into both nostrils every 12 (twelve) hours. (Patient taking differently: Place 2 sprays into both nostrils 2 (two) times daily as needed for rhinitis. ), Disp: 30 mL, Rfl: 6 .  isosorbide mononitrate (IMDUR) 60 MG 24 hr  tablet, TAKE 2 TABLETS(120 MG) BY MOUTH DAILY, Disp: 180 tablet, Rfl: 1 .  Lancets (ACCU-CHEK SOFT TOUCH) lancets, Use as instructed, Disp: 100 each, Rfl: 12 .  levothyroxine (SYNTHROID) 25 MCG tablet, Take 1 tablet (25 mcg total) by mouth daily before breakfast., Disp: 90 tablet, Rfl: 1 .  lidocaine-prilocaine (EMLA) cream, Apply 1 application topically as needed (for port access). , Disp: , Rfl: 4 .  LINZESS 145 MCG CAPS capsule, TAKE 1 CAPSULE(145 MCG) BY MOUTH DAILY BEFORE BREAKFAST, Disp: 30 capsule, Rfl: 0 .  metoCLOPramide (REGLAN) 5 MG tablet, Take 1 tablet (5 mg total) by mouth every 8 (eight) hours as  needed for nausea., Disp: 90 tablet, Rfl: 3 .  metoprolol tartrate (LOPRESSOR) 25 MG tablet, , Disp: , Rfl:  .  multivitamin (RENA-VIT) TABS tablet, Take 1 tablet by mouth daily., Disp: , Rfl:  .  Na Sulfate-K Sulfate-Mg Sulf 17.5-3.13-1.6 GM/177ML SOLN, Suprep-Use as directed, Disp: 354 mL, Rfl: 0 .  nitroGLYCERIN (NITROSTAT) 0.4 MG SL tablet, Place 1 tablet (0.4 mg total) under the tongue every 5 (five) minutes as needed., Disp: 30 tablet, Rfl: 2 .  NOVOLOG FLEXPEN 100 UNIT/ML FlexPen, ADMINISTER 5 UNITS UNDER THE SKIN THREE TIMES DAILY WITH MEALS, Disp: 15 mL, Rfl: 0 .  ORENCIA CLICKJECT 330 MG/ML SOAJ, , Disp: , Rfl:  .  RENVELA 800 MG tablet, TAKE 3 TABLETS BY MOUTH THREE TIMES DAILY WITH MEALS, Disp: , Rfl: 3 .  sevelamer (RENAGEL) 800 MG tablet, Take 2,400 mg by mouth 3 (three) times daily with meals., Disp: , Rfl:  .  traMADol (ULTRAM) 50 MG tablet, Take 1 tablet (50 mg total) by mouth every 12 (twelve) hours as needed for moderate pain., Disp: 40 tablet, Rfl: 1 .  triamcinolone cream (KENALOG) 0.1 %, Apply 1 application topically 2 (two) times daily., Disp: 45 g, Rfl: 1  Allergies  Allergen Reactions  . Digoxin And Related Diarrhea and Other (See Comments)    TOXIC DRUG LEVELS Patient stated she almost died. Had flu like symptoms as well as diarrhea.  . Hydralazine Shortness Of Breath  . Penicillins Cross Reactors Hives and Other (See Comments)    HIGH FEVER  Has patient had a PCN reaction causing immediate rash, facial/tongue/throat swelling, SOB or lightheadedness with hypotension: No Has patient had a PCN reaction causing SEVERE RASH INVOLVING MUCUS MEMBRANES or SKIN NECROSIS  #  #  #  YES  #  #  #  Has patient had a PCN reaction that required hospitalization: Unk Has patient had a PCN reaction occurring within the last 10 years: Unk If all of the above answers are "NO", then may proceed with Cephalosporin use.   [name] Lisinopril Other (See Comments)    Felt like she had the flu.  Was very sick!!!  . Adhesive [Tape] Rash and Other (See Comments)    bruising    Objective: Vascular Examination: Capillary refill time <5 seconds  x 10 digits.  Dorsalis pedis pulses 1/4 b/l.  Posterior tibial pulses 0/4 b/l.  Digital hair absent x 10 digits.  Skin temperature gradient warm to cool b/l.  Dermatological Examination: Skin with normal turgor, texture and tone b/l.  Toenails 1-5 b/l discolored, thick, dystrophic with subungual debris and pain with palpation to nailbeds due to thickness of nails.  Hyperkeratotic lesion(s) sub 5th met base left foot, sub heel left foot, submet head 1 left foot. No erythema, no edema, no drainage, no flocculence noted.   Musculoskeletal: Muscle strength 5/5  to all LE muscle groups.  HAV with bunion b/l.  Hammertoes b/l.  Pes planus foot deformity b/l.  No pain, crepitus or joint limitation with passive/active ROM.  Neurological: Sensation diminished with 10 gram monofilament.  Vibratory sensation diminished  Assessment: 1. Painful onychomycosis toenails 1-5 b/l 2. Calluses sub 5th met base left foot, sub heel left foot, submet head 1 left foot 3. NIDDM with Diabetic neuropathy  Plan: 1. Continue diabetic foot care principles. Literature dispensed on today. 2. Toenails 1-5 b/l were debrided in length and girth without iatrogenic bleeding. 3. Calluses pared sub 5th met base left foot, sub heel left foot, submet head 1 left foot utilizing sterile scalpel blade without incident. [place] who modified her left shoe on today. Call office if she experiences any problems with modification. 5. Patient to continue soft, supportive shoe gear. 6. Patient to report any pedal injuries to medical professional  7. Follow up 3 months.  8. Patient/POA to call should there be a concern in the interim.

## 2018-12-23 DIAGNOSIS — D509 Iron deficiency anemia, unspecified: Secondary | ICD-10-CM | POA: Diagnosis not present

## 2018-12-23 DIAGNOSIS — Z23 Encounter for immunization: Secondary | ICD-10-CM | POA: Diagnosis not present

## 2018-12-23 DIAGNOSIS — D631 Anemia in chronic kidney disease: Secondary | ICD-10-CM | POA: Diagnosis not present

## 2018-12-23 DIAGNOSIS — N2581 Secondary hyperparathyroidism of renal origin: Secondary | ICD-10-CM | POA: Diagnosis not present

## 2018-12-23 DIAGNOSIS — Z992 Dependence on renal dialysis: Secondary | ICD-10-CM | POA: Diagnosis not present

## 2018-12-23 DIAGNOSIS — N186 End stage renal disease: Secondary | ICD-10-CM | POA: Diagnosis not present

## 2018-12-24 ENCOUNTER — Other Ambulatory Visit: Payer: Self-pay | Admitting: Family Medicine

## 2018-12-24 DIAGNOSIS — Z1231 Encounter for screening mammogram for malignant neoplasm of breast: Secondary | ICD-10-CM

## 2018-12-25 DIAGNOSIS — Z992 Dependence on renal dialysis: Secondary | ICD-10-CM | POA: Diagnosis not present

## 2018-12-25 DIAGNOSIS — N2581 Secondary hyperparathyroidism of renal origin: Secondary | ICD-10-CM | POA: Diagnosis not present

## 2018-12-25 DIAGNOSIS — D631 Anemia in chronic kidney disease: Secondary | ICD-10-CM | POA: Diagnosis not present

## 2018-12-25 DIAGNOSIS — N186 End stage renal disease: Secondary | ICD-10-CM | POA: Diagnosis not present

## 2018-12-25 DIAGNOSIS — D509 Iron deficiency anemia, unspecified: Secondary | ICD-10-CM | POA: Diagnosis not present

## 2018-12-25 DIAGNOSIS — Z23 Encounter for immunization: Secondary | ICD-10-CM | POA: Diagnosis not present

## 2018-12-28 DIAGNOSIS — N2581 Secondary hyperparathyroidism of renal origin: Secondary | ICD-10-CM | POA: Diagnosis not present

## 2018-12-28 DIAGNOSIS — D631 Anemia in chronic kidney disease: Secondary | ICD-10-CM | POA: Diagnosis not present

## 2018-12-28 DIAGNOSIS — Z23 Encounter for immunization: Secondary | ICD-10-CM | POA: Diagnosis not present

## 2018-12-28 DIAGNOSIS — N186 End stage renal disease: Secondary | ICD-10-CM | POA: Diagnosis not present

## 2018-12-28 DIAGNOSIS — Z992 Dependence on renal dialysis: Secondary | ICD-10-CM | POA: Diagnosis not present

## 2018-12-28 DIAGNOSIS — D509 Iron deficiency anemia, unspecified: Secondary | ICD-10-CM | POA: Diagnosis not present

## 2018-12-30 DIAGNOSIS — Z992 Dependence on renal dialysis: Secondary | ICD-10-CM | POA: Diagnosis not present

## 2018-12-30 DIAGNOSIS — D509 Iron deficiency anemia, unspecified: Secondary | ICD-10-CM | POA: Diagnosis not present

## 2018-12-30 DIAGNOSIS — Z23 Encounter for immunization: Secondary | ICD-10-CM | POA: Diagnosis not present

## 2018-12-30 DIAGNOSIS — N186 End stage renal disease: Secondary | ICD-10-CM | POA: Diagnosis not present

## 2018-12-30 DIAGNOSIS — D631 Anemia in chronic kidney disease: Secondary | ICD-10-CM | POA: Diagnosis not present

## 2018-12-30 DIAGNOSIS — E039 Hypothyroidism, unspecified: Secondary | ICD-10-CM | POA: Diagnosis not present

## 2018-12-30 DIAGNOSIS — E1129 Type 2 diabetes mellitus with other diabetic kidney complication: Secondary | ICD-10-CM | POA: Diagnosis not present

## 2018-12-30 DIAGNOSIS — N2581 Secondary hyperparathyroidism of renal origin: Secondary | ICD-10-CM | POA: Diagnosis not present

## 2019-01-01 DIAGNOSIS — Z23 Encounter for immunization: Secondary | ICD-10-CM | POA: Diagnosis not present

## 2019-01-01 DIAGNOSIS — D509 Iron deficiency anemia, unspecified: Secondary | ICD-10-CM | POA: Diagnosis not present

## 2019-01-01 DIAGNOSIS — D631 Anemia in chronic kidney disease: Secondary | ICD-10-CM | POA: Diagnosis not present

## 2019-01-01 DIAGNOSIS — N2581 Secondary hyperparathyroidism of renal origin: Secondary | ICD-10-CM | POA: Diagnosis not present

## 2019-01-01 DIAGNOSIS — Z992 Dependence on renal dialysis: Secondary | ICD-10-CM | POA: Diagnosis not present

## 2019-01-01 DIAGNOSIS — N186 End stage renal disease: Secondary | ICD-10-CM | POA: Diagnosis not present

## 2019-01-04 DIAGNOSIS — Z992 Dependence on renal dialysis: Secondary | ICD-10-CM | POA: Diagnosis not present

## 2019-01-04 DIAGNOSIS — N2581 Secondary hyperparathyroidism of renal origin: Secondary | ICD-10-CM | POA: Diagnosis not present

## 2019-01-04 DIAGNOSIS — D631 Anemia in chronic kidney disease: Secondary | ICD-10-CM | POA: Diagnosis not present

## 2019-01-04 DIAGNOSIS — D509 Iron deficiency anemia, unspecified: Secondary | ICD-10-CM | POA: Diagnosis not present

## 2019-01-04 DIAGNOSIS — N186 End stage renal disease: Secondary | ICD-10-CM | POA: Diagnosis not present

## 2019-01-04 DIAGNOSIS — Z23 Encounter for immunization: Secondary | ICD-10-CM | POA: Diagnosis not present

## 2019-01-06 ENCOUNTER — Ambulatory Visit: Payer: Self-pay | Admitting: *Deleted

## 2019-01-06 DIAGNOSIS — D631 Anemia in chronic kidney disease: Secondary | ICD-10-CM | POA: Diagnosis not present

## 2019-01-06 DIAGNOSIS — N186 End stage renal disease: Secondary | ICD-10-CM | POA: Diagnosis not present

## 2019-01-06 DIAGNOSIS — Z23 Encounter for immunization: Secondary | ICD-10-CM | POA: Diagnosis not present

## 2019-01-06 DIAGNOSIS — N2581 Secondary hyperparathyroidism of renal origin: Secondary | ICD-10-CM | POA: Diagnosis not present

## 2019-01-06 DIAGNOSIS — Z992 Dependence on renal dialysis: Secondary | ICD-10-CM | POA: Diagnosis not present

## 2019-01-06 DIAGNOSIS — D509 Iron deficiency anemia, unspecified: Secondary | ICD-10-CM | POA: Diagnosis not present

## 2019-01-07 ENCOUNTER — Other Ambulatory Visit: Payer: Self-pay | Admitting: *Deleted

## 2019-01-08 DIAGNOSIS — Z23 Encounter for immunization: Secondary | ICD-10-CM | POA: Diagnosis not present

## 2019-01-08 DIAGNOSIS — D631 Anemia in chronic kidney disease: Secondary | ICD-10-CM | POA: Diagnosis not present

## 2019-01-08 DIAGNOSIS — N186 End stage renal disease: Secondary | ICD-10-CM | POA: Diagnosis not present

## 2019-01-08 DIAGNOSIS — D509 Iron deficiency anemia, unspecified: Secondary | ICD-10-CM | POA: Diagnosis not present

## 2019-01-08 DIAGNOSIS — Z992 Dependence on renal dialysis: Secondary | ICD-10-CM | POA: Diagnosis not present

## 2019-01-08 DIAGNOSIS — N2581 Secondary hyperparathyroidism of renal origin: Secondary | ICD-10-CM | POA: Diagnosis not present

## 2019-01-09 ENCOUNTER — Encounter (HOSPITAL_COMMUNITY): Payer: Self-pay

## 2019-01-09 ENCOUNTER — Other Ambulatory Visit: Payer: Self-pay

## 2019-01-09 ENCOUNTER — Ambulatory Visit (HOSPITAL_COMMUNITY)
Admission: EM | Admit: 2019-01-09 | Discharge: 2019-01-09 | Disposition: A | Discharge status: Home / Self Care | Payer: Medicare Other | Attending: Internal Medicine | Admitting: Internal Medicine

## 2019-01-09 DIAGNOSIS — M7918 Myalgia, other site: Secondary | ICD-10-CM | POA: Diagnosis not present

## 2019-01-09 MED ORDER — HYDROCODONE-ACETAMINOPHEN 5-325 MG PO TABS: 2.0000 | ORAL_TABLET | ORAL | 0 refills | Status: DC | PRN

## 2019-01-09 MED ORDER — KETOROLAC TROMETHAMINE 30 MG/ML IJ SOLN: 30.0000 mg | Freq: Once | INTRAMUSCULAR | Status: DC

## 2019-01-09 NOTE — ED Provider Notes (Signed)
[place]    CSN: [id number] Arrival date & time: 01/09/19  1202      History   Chief Complaint Chief Complaint  Patient presents with  . Fall    HPI Brooke Stafford is a 63 y.o. female with a history of coronary artery disease status post CABG, congestive heart failure and chronic kidney disease on maintenance hemodialysis comes to urgent care with 4-day history of right-sided body pain.  Patient had a dizzy spell and fell at home.  She fell to the right and landed on right side of her body.  She did not lose consciousness.  She comes in complaining of persistent pain on the right side.  Pain is sharp and throbbing in nature.  It is of moderate severity.  Aggravated by movement.  No known relieving factors.  Patient took a dose of tramadol.  She denies any numbness or tingling in the upper extremities.  No nausea or vomiting.  No change in bowel bladder habits.  Patient denies any confusion or loss of consciousness.  Last mammogram was done in 2019 and was normal. HPI  Past Medical History:  Diagnosis Date  . Anemia   . Anginal pain ([place])   . Anxiety   . Arthritis    "stiff fingers and knees" (08/04/2013), (12/12/2014)  . Asthma   . CAD (coronary artery disease) 2002; 2015   CABG x 6 2002, cath 2011- med Rx stent DES VG-Diag  . CAD (coronary artery disease) of artery bypass graft; DES to VG-Diag 09/28/13 11/09/2013  . Cataract   . CHF (congestive heart failure) ([place])    "in 2002" (11/26/2012)  . Chronic bronchitis ([place])    "q year; in the winter"   . CKD (chronic kidney disease)    stage 4, followed by [facility]  . Coronary artery disease 2002   CABG x 6. Cath 5/11- med Rx  . Diabetic neuropathy ([place])   . GERD (gastroesophageal reflux disease)   . Gout    "right big toe"  . Headache    "~ q week" (08/04/2013); "~ twice/month" (12/12/2014)  . History of blood transfusion 2002   "when I had OHS"  . Hyperlipidemia   . Hypertension   . Hypothyroid    treated  . Migraines    "couple times/year" (08/04/2013), (12/12/2014)  . Myocardial infarction ([place]) 2000; 2002; 2011  . Obesity (BMI 35.0-39.9 without comorbidity)   . Peripheral vascular disease ([place]) 12/12   LSFA PTA  . Pneumonia    "3 times I think" (12/12/2014)  . Stroke [facility])    " mini stroke"  . Type II diabetes mellitus [facility])     Patient Active Problem List   Diagnosis Date Noted  . Obesity (BMI 30.0-34.9)   . Heme positive stool   . Benign neoplasm of ascending colon   . Benign neoplasm of transverse colon   . Benign neoplasm of descending colon   . Hypercholesterolemia 06/18/2017  . Coronary artery disease of autologous vein bypass graft with stable angina pectoris ([place]) 06/18/2017  . Diabetic neuropathy ([place]) 04/17/2017  . GERD (gastroesophageal reflux disease) 04/17/2017  . Foot ulcer, right ([place]) 11/28/2016  . Cervical polyp 11/12/2016  . AV (arteriovenous fistula) ([place]) 06/17/2016  . Mixed hyperlipidemia 05/21/2016  . ESRD (end stage renal disease) ([place])   . Hypokalemia 05/03/2016  . COPD exacerbation ([place]) 05/03/2016  . COPD (chronic obstructive pulmonary disease) ([place]) 04/11/2016  . CAP (community acquired pneumonia) 03/26/2016  . Acute respiratory failure  with hypoxia ([place]) 03/26/2016  . Acute on chronic combined systolic and diastolic CHF (congestive heart failure) ([place]) 03/26/2016  . Medication management 03/21/2016  . Anemia of chronic disease 12/27/2015  . Acute on chronic systolic CHF (congestive heart failure) ([place])   . Acute CHF ([place]) 12/18/2015  . Hypertensive heart and renal disease with CHF and ESRD ([place]) 12/18/2015  . Constipation 12/18/2015  . Anxiety 11/02/2015  . Posterior circulation stroke ([place]) 11/02/2015  . Abnormality of gait 10/23/2015  . Depression 09/28/2015  . Diabetic retinopathy ([place]) 09/13/2015  . Grief reaction 08/24/2015  . Creatinine elevation 07/25/2015  . Chronic combined systolic and diastolic congestive heart failure  ([place]) 07/25/2015  . Rash of hands 02/23/2015  . Essential hypertension   . Angina pectoris ([place]) 12/12/2014  . Abnormal nuclear stress test   . Acute combined systolic and diastolic heart failure ([place]) 12/02/2014  . Seasonal allergies 08/11/2014  . Gout 08/11/2014  . Encounter for screening mammogram for breast cancer 05/16/2014  . Screening for colon cancer 05/16/2014  . Type 2 diabetes mellitus with ESRD (end-stage renal disease) ([place]) 01/27/2014  . Atopic eczema 01/27/2014  . Precordial pain, atypical, negative MI, Musculature Skeletal pain  11/08/2013  . CKD (chronic kidney disease) stage 5, GFR less than 15 ml/min (HCC) 11/08/2013  . Unstable angina ([place]) 09/28/2013  . Ischemic cardiomyopathy- new drop in EF 08/30/2013  . Chest pain 08/04/2013  . CAD -S/P PCI June 2015 and 12/14/14 02/01/2013  . Hypothyroidism, acquired 02/01/2013  . PVD, LSFA PTA 12/12 04/06/2011  . Hx of CABG x 6 2002 04/06/2011  . Dyslipidemia 04/06/2011    Past Surgical History:  Procedure Laterality Date  . ABDOMINAL AORTAGRAM N/A 04/05/2011   Procedure: ABDOMINAL AORTAGRAM;  Surgeon: Lorretta Harp, MD;  Location: [facility] CATH LAB;  Service: Cardiovascular;  Laterality: N/A;  . ABDOMINAL AORTOGRAM N/A 10/30/2016   Procedure: Abdominal Aortogram;  Surgeon: Wellington Hampshire, MD;  Location: [place] CV LAB;  Service: Cardiovascular;  Laterality: N/A;  . APPENDECTOMY  1980  . AV FISTULA PLACEMENT Left 05/10/2016   Procedure: LEFT ARM ARTERIOVENOUS (AV) FISTULA CREATION;  Surgeon: Rosetta Posner, MD;  Location: [place];  Service: Vascular;  Laterality: Left;  . BASCILIC VEIN TRANSPOSITION Left 06/26/2016   Procedure: SECOND STAGE BASILIC VEIN TRANSPOSITION;  Surgeon: Rosetta Posner, MD;  Location: [place];  Service: Vascular;  Laterality: Left;  . BREAST CYST EXCISION Right 1970's  . CARDIAC CATHETERIZATION  2002  . CARDIAC CATHETERIZATION N/A 12/12/2014   Procedure: Left Heart Cath and Cors/Grafts Angiography;   Surgeon: Peter M Martinique, MD;  Location: [place] CV LAB;  Service: Cardiovascular;  Laterality: N/A;  . CARDIAC CATHETERIZATION N/A 12/12/2014   Procedure: Coronary Stent Intervention;  Surgeon: Peter M Martinique, MD;  Location: [place] CV LAB;  Service: Cardiovascular;  Laterality: N/A;  . CATARACT EXTRACTION, BILATERAL    . CESAREAN SECTION  1978; 1980  . CHOLECYSTECTOMY  1982  . COLONOSCOPY WITH PROPOFOL N/A 11/03/2017   Procedure: COLONOSCOPY WITH PROPOFOL;  Surgeon: Doran Stabler, MD;  Location: [facility] ENDOSCOPY;  Service: Gastroenterology;  Laterality: N/A;  . CORONARY ANGIOPLASTY WITH STENT PLACEMENT  2004; 2012   "I have 2 stents" (08/04/2013)  . CORONARY ANGIOPLASTY WITH STENT PLACEMENT  09/28/13   PTCA/ DES Xience stent to VG-Diag   . CORONARY ARTERY BYPASS GRAFT  11/20/2000   x6 LIMA to distal LAD, svg to first diag, svg to ramus intermediate branch and swquential SVG  to cir marginal branch, SVG to posterior descending coronary and sequential SVG to first right posterolateral branch  . eye injections    . INSERTION OF DIALYSIS CATHETER Right 05/10/2016   Procedure: INSERTION OF DIALYSIS CATHETER - Right Internal Jugular Placement;  Surgeon: Rosetta Posner, MD;  Location: [place];  Service: Vascular;  Laterality: Right;  . LEFT HEART CATHETERIZATION WITH CORONARY/GRAFT ANGIOGRAM N/A 09/28/2013   Procedure: LEFT HEART CATHETERIZATION WITH Beatrix Fetters;  Surgeon: Peter M Martinique, MD;  Location: [facility] CATH LAB;  Service: Cardiovascular;  Laterality: N/A;  . LOWER EXTREMITY ANGIOGRAM  12/01/2012   Procedure: LOWER EXTREMITY ANGIOGRAM;  Surgeon: Lorretta Harp, MD;  Location: [facility] CATH LAB;  Service: Cardiovascular;;  . LOWER EXTREMITY ANGIOGRAPHY Right 10/30/2016   Procedure: Lower Extremity Angiography;  Surgeon: Wellington Hampshire, MD;  Location: [place] CV LAB;  Service: Cardiovascular;  Laterality: Right;  . NM MYOCAR PERF WALL MOTION  08/27/2004   negative  . PERCUTANEOUS STENT  INTERVENTION Left 12/01/2012   Procedure: PERCUTANEOUS STENT INTERVENTION;  Surgeon: Lorretta Harp, MD;  Location: [facility] CATH LAB;  Service: Cardiovascular;  Laterality: Left;  Left SFA  . PERIPHERAL ARTERIAL STENT GRAFT Left    SFA/notes 04/07/2011 (11/30/2012)  . PERIPHERAL VASCULAR ATHERECTOMY Right 10/30/2016   Procedure: Peripheral Vascular Atherectomy;  Surgeon: Wellington Hampshire, MD;  Location: [place] CV LAB;  Service: Cardiovascular;  Laterality: Right;  cancel unable to  . PERIPHERAL VASCULAR INTERVENTION Right 10/30/2016   Procedure: Peripheral Vascular Intervention;  Surgeon: Wellington Hampshire, MD;  Location: [place] CV LAB;  Service: Cardiovascular;  Laterality: Right;  SFA  . POLYPECTOMY  11/03/2017   Procedure: POLYPECTOMY;  Surgeon: Doran Stabler, MD;  Location: [facility];  Service: Gastroenterology;;  . RENAL ANGIOGRAM N/A 04/05/2011   Procedure: RENAL ANGIOGRAM;  Surgeon: Lorretta Harp, MD;  Location: [facility] CATH LAB;  Service: Cardiovascular;  Laterality: N/A;  . TUBAL LIGATION  1980    OB History   No obstetric history on file.      Home Medications    Prior to Admission medications   Medication Sig Start Date End Date Taking? Authorizing Provider  Alcohol Swabs (ALCOHOL PREP) PADS  07/29/18   [provider]  allopurinol (ZYLOPRIM) 300 MG tablet TAKE 1 TABLET(300 MG) BY MOUTH DAILY 08/18/18   Charlott Rakes, MD  amitriptyline (ELAVIL) 10 MG tablet Take 1 tablet (10 mg total) by mouth at bedtime. 10/27/18   Charlott Rakes, MD  aspirin 81 MG chewable tablet Chew 1 tablet (81 mg total) by mouth daily. 02/24/15   Croitoru, Mihai, MD  atorvastatin (LIPITOR) 40 MG tablet TAKE 1 TABLET(40 MG) BY MOUTH EVERY EVENING 06/15/18   Croitoru, Mihai, MD  AURYXIA 1 GM 210 MG(Fe) tablet TAKE 2 TABLETS BY MOUTH THREE TIMES A DAY WITH MEALS.   SWALLOW WHOLE, DO NOT CHEW OR CRUSH MEDICATION 12/16/17   [provider]  Blood Glucose Monitoring Suppl (ACCU-CHEK  AVIVA CONNECT) w/Device KIT 16 Units by Does not apply route daily. 01/27/18   Charlott Rakes, MD  busPIRone (BUSPAR) 10 MG tablet Take 1 tablet (10 mg total) by mouth 2 (two) times daily. 08/18/18   Charlott Rakes, MD  cetirizine (ZYRTEC) 10 MG tablet Take 10 mg by mouth daily.    [provider]  ciprofloxacin (CIPRO) 500 MG tablet Take 1 tablet (500 mg total) by mouth daily with breakfast. Administer after dialysis on hemodialysis days. 08/24/18   Charlott Rakes, MD  clopidogrel (PLAVIX) 75 MG tablet Take 1 tablet (75 mg total) by mouth daily. 08/18/18   Charlott Rakes, MD  cyclobenzaprine (FLEXERIL) 10 MG tablet TK 1 T PO QD HS FOR 14 DAYS PRN 11/19/18   [provider]  fluticasone (FLONASE) 50 MCG/ACT nasal spray PLACE 1 SPRAY INTO BOTH NOSTRILS DAILY 11/30/18   Charlott Rakes, MD  gabapentin (NEURONTIN) 400 MG capsule Take 1 capsule (400 mg total) by mouth 2 (two) times daily. 08/18/18   Charlott Rakes, MD  glucose blood (ACCU-CHEK ACTIVE STRIPS) test strip Use as instructed 01/27/18   Charlott Rakes, MD  HYDROcodone-acetaminophen (NORCO/VICODIN) 5-325 MG tablet Take 2 tablets by mouth every 4 (four) hours as needed. 01/09/19   Lamptey, Myrene Galas, MD  hydroxychloroquine (PLAQUENIL) 200 MG tablet TAKE 2 TABLETS BY MOUTH EVERY DAY WITH FOOD OR MILK 12/18/17   [provider]  hydrOXYzine (ATARAX/VISTARIL) 25 MG tablet Take 0.5 tablets (12.5 mg total) by mouth daily as needed. Patient taking differently: Take 12.5 mg by mouth daily as needed for anxiety.  10/21/17   Charlott Rakes, MD  insulin aspart (NOVOLOG FLEXPEN) 100 UNIT/ML FlexPen Inject 5-8 Units into the skin 3 (three) times daily with meals. 01/13/18   Philemon Kingdom, MD  Insulin Aspart FlexPen 100 UNIT/ML SOPN ADMINISTER 5 UNITS UNDER THE SKIN THREE TIMES DAILY WITH MEALS 12/14/18   Philemon Kingdom, MD  Insulin Glargine (BASAGLAR KWIKPEN) 100 UNIT/ML SOPN INJECT 10 UNITS INTO THE SKIN AT BEDTIME 05/12/18   Charlott Rakes, MD  Insulin Pen Needle 32G X 4 MM MISC Use 4x a day 09/18/17   Philemon Kingdom, MD  ipratropium (ATROVENT) 0.03 % nasal spray Place 2 sprays into both nostrils every 12 (twelve) hours. Patient taking differently: Place 2 sprays into both nostrils 2 (two) times daily as needed for rhinitis.  07/15/17   Charlott Rakes, MD  isosorbide mononitrate (IMDUR) 60 MG 24 hr tablet TAKE 2 TABLETS(120 MG) BY MOUTH DAILY 08/18/18   Charlott Rakes, MD  Lancets (ACCU-CHEK SOFT TOUCH) lancets Use as instructed 01/27/18   Charlott Rakes, MD  levothyroxine (SYNTHROID) 25 MCG tablet Take 1 tablet (25 mcg total) by mouth daily before breakfast. 08/21/18   Charlott Rakes, MD  lidocaine-prilocaine (EMLA) cream Apply 1 application topically as needed (for port access).  10/21/17   [provider]  LINZESS 145 MCG CAPS capsule TAKE 1 CAPSULE(145 MCG) BY MOUTH DAILY BEFORE BREAKFAST 01/28/18   Willia Craze, NP  metoCLOPramide (REGLAN) 5 MG tablet Take 1 tablet (5 mg total) by mouth every 8 (eight) hours as needed for nausea. 01/27/18   Charlott Rakes, MD  metoprolol tartrate (LOPRESSOR) 25 MG tablet  09/06/18   [provider]  multivitamin (RENA-VIT) TABS tablet Take 1 tablet by mouth daily. 09/10/18   [provider]  Na Sulfate-K Sulfate-Mg Sulf 17.5-3.13-1.6 GM/177ML SOLN Suprep-Use as directed 08/28/17   Willia Craze, NP  nitroGLYCERIN (NITROSTAT) 0.4 MG SL tablet Place 1 tablet (0.4 mg total) under the tongue every 5 (five) minutes as needed. 04/11/16   Charlott Rakes, MD  NOVOLOG FLEXPEN 100 UNIT/ML FlexPen ADMINISTER 5 UNITS UNDER THE SKIN THREE TIMES DAILY WITH MEALS 01/26/18   Philemon Kingdom, MD  ORENCIA CLICKJECT 458 MG/ML SOAJ  01/10/18   [provider]  RENVELA 800 MG tablet TAKE 3 TABLETS BY MOUTH THREE TIMES DAILY WITH MEALS 11/14/17   [provider]  sevelamer (RENAGEL) 800 MG tablet Take 2,400 mg by mouth 3 (three) times daily with  meals.     [provider]  traMADol (ULTRAM) 50 MG tablet Take 1 tablet (50 mg total) by mouth every 12 (twelve) hours as needed for moderate pain. 08/18/18   Charlott Rakes, MD  triamcinolone cream (KENALOG) 0.1 % Apply 1 application topically 2 (two) times daily. 05/12/18   Charlott Rakes, MD    Family History Family History  Problem Relation Age of Onset  . Diabetes Mother   . Hypertension Mother   . Stroke Mother   . Hypertension Father   . Hypertension Brother   . Hypertension Sister   . Diabetes Sister   . Hyperlipidemia Sister     Social History Social History   Tobacco Use  . Smoking status: Former Smoker    Packs/day: 0.00    Years: 25.00    Pack years: 0.00    Types: Cigarettes    Quit date: 04/04/2000    Years since quitting: 18.7  . Smokeless tobacco: Never Used  Substance Use Topics  . Alcohol use: No    Comment: 12/12/2014  "have a glass of red wine on my birthday q yr; that's it"  . Drug use: No     Allergies   Digoxin and related, Hydralazine, Penicillins cross reactors, Lisinopril, and Adhesive [tape]   Review of Systems Review of Systems  Constitutional: Positive for activity change. Negative for fatigue and unexpected weight change.  HENT: Negative.   Respiratory: Negative.   Cardiovascular: Negative.   Gastrointestinal: Negative.   Genitourinary: Negative.   Musculoskeletal: Positive for arthralgias, back pain, myalgias and neck pain.  Skin: Negative for pallor, rash and wound.  Neurological: Negative for dizziness, weakness, light-headedness and headaches.  Psychiatric/Behavioral: Negative.      Physical Exam Triage Vital Signs ED Triage Vitals [01/09/19 1246]  Enc Vitals Group     BP 136/72     Pulse Rate 69     Resp 16     Temp 99.7 F (37.6 C)     Temp Source Oral     SpO2 100 %     Weight      Height      Head Circumference      Peak Flow      Pain Score 10     Pain Loc      Pain Edu?      Excl. in [place]?    No data found.   Updated Vital Signs BP 136/72 (BP Location: Right Arm)   Pulse 69   Temp 99.7 F (37.6 C) (Oral)   Resp 16   LMP 10/13/2014 (Exact Date)   SpO2 100%   Visual Acuity Right Eye Distance:   Left Eye Distance:   Bilateral Distance:    Right Eye Near:   Left Eye Near:    Bilateral Near:     Physical Exam Vitals signs and nursing note reviewed.  Constitutional:      General: She is in acute distress.     Appearance: She is not ill-appearing.  HENT:     Nose: Nose normal.     Mouth/Throat:     Mouth: Mucous membranes are moist.  Abdominal:     General: Bowel sounds are normal.     Palpations: Abdomen is soft.  Genitourinary:    Comments: Breast exam is without any masses.  Patient has sternotomy scar. Musculoskeletal:        General: Tenderness present. No swelling or deformity.     Comments: Full range of motion of the right  upper extremity and ankle and shoulder joint.  Tenderness to palpation over the latissimus dorsi muscles.  Patient has a firm mobile cyst in the right axilla.  Skin:    General: Skin is warm.     Capillary Refill: Capillary refill takes less than 2 seconds.     Coloration: Skin is not jaundiced.     Findings: No bruising or lesion.  Neurological:     General: No focal deficit present.     Mental Status: She is alert.      UC Treatments / Results  Labs (all labs ordered are listed, but only abnormal results are displayed) Labs Reviewed - No data to display  EKG   Radiology No results found.  Procedures Procedures (including critical care time)  Medications Ordered in UC Medications - No data to display  Initial Impression / Assessment and Plan / UC Course  I have reviewed the triage vital signs and the nursing notes.  Pertinent labs & imaging results that were available during my care of the patient were reviewed by me and considered in my medical decision making (see chart for details).     1.  Musculoskeletal pain over the  right side of the back and right shoulder region: Hydrocodone/-acetaminophen 1 tablet every 6 hours as needed for pain Gentle stretches Heating pad for 5 to 7 minutes with 15 minutes resting between.  Patient is advised to do that twice daily.  If patient's pain gets worse she is advised to return to urgent care to be reevaluated. Final Clinical Impressions(s) / UC Diagnoses   Final diagnoses:  Musculoskeletal pain   Discharge Instructions   None    ED Prescriptions    Medication Sig Dispense Auth. Provider   HYDROcodone-acetaminophen (NORCO/VICODIN) 5-325 MG tablet Take 2 tablets by mouth every 4 (four) hours as needed. 10 tablet Lamptey, Myrene Galas, MD     I have reviewed the PDMP during this encounter.   Chase Picket, MD 01/09/19 [phone]

## 2019-01-09 NOTE — ED Triage Notes (Signed)
Pt present she was dizzy when she fall and hurt the right side of her body on Monday. Pt states the right side of her body is in pain. Her ribs, arm, shoulder, leg, etc.

## 2019-01-11 DIAGNOSIS — N2581 Secondary hyperparathyroidism of renal origin: Secondary | ICD-10-CM | POA: Diagnosis not present

## 2019-01-11 DIAGNOSIS — Z992 Dependence on renal dialysis: Secondary | ICD-10-CM | POA: Diagnosis not present

## 2019-01-11 DIAGNOSIS — Z23 Encounter for immunization: Secondary | ICD-10-CM | POA: Diagnosis not present

## 2019-01-11 DIAGNOSIS — N186 End stage renal disease: Secondary | ICD-10-CM | POA: Diagnosis not present

## 2019-01-11 DIAGNOSIS — D509 Iron deficiency anemia, unspecified: Secondary | ICD-10-CM | POA: Diagnosis not present

## 2019-01-11 DIAGNOSIS — D631 Anemia in chronic kidney disease: Secondary | ICD-10-CM | POA: Diagnosis not present

## 2019-01-12 DIAGNOSIS — B028 Zoster with other complications: Secondary | ICD-10-CM | POA: Diagnosis not present

## 2019-01-12 DIAGNOSIS — B0223 Postherpetic polyneuropathy: Secondary | ICD-10-CM | POA: Diagnosis not present

## 2019-01-14 DIAGNOSIS — N2581 Secondary hyperparathyroidism of renal origin: Secondary | ICD-10-CM | POA: Diagnosis not present

## 2019-01-14 DIAGNOSIS — I129 Hypertensive chronic kidney disease with stage 1 through stage 4 chronic kidney disease, or unspecified chronic kidney disease: Secondary | ICD-10-CM | POA: Diagnosis not present

## 2019-01-14 DIAGNOSIS — N186 End stage renal disease: Secondary | ICD-10-CM | POA: Diagnosis not present

## 2019-01-14 DIAGNOSIS — D509 Iron deficiency anemia, unspecified: Secondary | ICD-10-CM | POA: Diagnosis not present

## 2019-01-14 DIAGNOSIS — Z992 Dependence on renal dialysis: Secondary | ICD-10-CM | POA: Diagnosis not present

## 2019-01-14 DIAGNOSIS — D631 Anemia in chronic kidney disease: Secondary | ICD-10-CM | POA: Diagnosis not present

## 2019-01-14 DIAGNOSIS — E1129 Type 2 diabetes mellitus with other diabetic kidney complication: Secondary | ICD-10-CM | POA: Diagnosis not present

## 2019-01-15 ENCOUNTER — Other Ambulatory Visit: Payer: Self-pay

## 2019-01-15 DIAGNOSIS — E1129 Type 2 diabetes mellitus with other diabetic kidney complication: Secondary | ICD-10-CM | POA: Diagnosis not present

## 2019-01-15 DIAGNOSIS — N2581 Secondary hyperparathyroidism of renal origin: Secondary | ICD-10-CM | POA: Diagnosis not present

## 2019-01-15 DIAGNOSIS — N186 End stage renal disease: Secondary | ICD-10-CM | POA: Diagnosis not present

## 2019-01-15 DIAGNOSIS — D509 Iron deficiency anemia, unspecified: Secondary | ICD-10-CM | POA: Diagnosis not present

## 2019-01-15 DIAGNOSIS — D631 Anemia in chronic kidney disease: Secondary | ICD-10-CM | POA: Diagnosis not present

## 2019-01-15 DIAGNOSIS — Z992 Dependence on renal dialysis: Secondary | ICD-10-CM | POA: Diagnosis not present

## 2019-01-16 ENCOUNTER — Other Ambulatory Visit: Payer: Self-pay | Admitting: Family Medicine

## 2019-01-16 DIAGNOSIS — G44209 Tension-type headache, unspecified, not intractable: Secondary | ICD-10-CM

## 2019-01-18 DIAGNOSIS — D631 Anemia in chronic kidney disease: Secondary | ICD-10-CM | POA: Diagnosis not present

## 2019-01-18 DIAGNOSIS — N2581 Secondary hyperparathyroidism of renal origin: Secondary | ICD-10-CM | POA: Diagnosis not present

## 2019-01-18 DIAGNOSIS — E1129 Type 2 diabetes mellitus with other diabetic kidney complication: Secondary | ICD-10-CM | POA: Diagnosis not present

## 2019-01-18 DIAGNOSIS — N186 End stage renal disease: Secondary | ICD-10-CM | POA: Diagnosis not present

## 2019-01-18 DIAGNOSIS — Z992 Dependence on renal dialysis: Secondary | ICD-10-CM | POA: Diagnosis not present

## 2019-01-18 DIAGNOSIS — D509 Iron deficiency anemia, unspecified: Secondary | ICD-10-CM | POA: Diagnosis not present

## 2019-01-19 ENCOUNTER — Ambulatory Visit: Payer: Self-pay | Admitting: Internal Medicine

## 2019-01-19 ENCOUNTER — Ambulatory Visit (HOSPITAL_COMMUNITY)
Admission: EM | Admit: 2019-01-19 | Discharge: 2019-01-19 | Disposition: A | Discharge status: Home / Self Care | Payer: Medicare Other | Attending: Family Medicine | Admitting: Family Medicine

## 2019-01-19 ENCOUNTER — Encounter (HOSPITAL_COMMUNITY): Payer: Self-pay

## 2019-01-19 ENCOUNTER — Other Ambulatory Visit: Payer: Self-pay

## 2019-01-19 DIAGNOSIS — B029 Zoster without complications: Secondary | ICD-10-CM

## 2019-01-19 MED ORDER — HYDROCODONE-ACETAMINOPHEN 5-325 MG PO TABS: 1.0000 | ORAL_TABLET | Freq: Four times a day (QID) | ORAL | 0 refills | Status: AC | PRN

## 2019-01-19 NOTE — ED Provider Notes (Signed)
[place]   [id number] 01/19/19 Arrival Time: 0737  ASSESSMENT & PLAN:  1. Herpes zoster without complication     No sign of bacterial skin infection. Continue and finish Valtrex.  Meds ordered this encounter  Medications  . HYDROcodone-acetaminophen (NORCO/VICODIN) 5-325 MG tablet    Sig: Take 1 tablet by mouth every 6 (six) hours as needed for moderate pain or severe pain.    Dispense:  20 tablet    Refill:  0    [place] Controlled Substances Registry consulted for this patient. I feel the risk/benefit ratio today is favorable for proceeding with this prescription for a controlled substance. Medication sedation precautions given.  Recommend: Follow-up Information    Charlott Rakes, MD.   Specialty: Family Medicine Why: If worsening or failing to improve as anticipated. Contact information: [place] [address] [phone]        [facility].   Specialty: Urgent Care Why: As needed. Contact information: [place]          Will follow up with PCP or here if worsening or failing to improve as anticipated. Reviewed expectations re: course of current medical issues. Questions answered. Outlined signs and symptoms indicating need for more acute intervention. Patient verbalized understanding. After Visit Summary given.   SUBJECTIVE:  Brooke Stafford is a 63 y.o. female who presents with a skin complaint.   Location: R arm and R upper back Onset: gradual Duration: 1-2 weeks Associated pruritis? none Associated pain? significant Progression: stable  Drainage? none Known trigger? "told it was shingles"; is taking Valtrex. New soaps/lotions/topicals/detergents? No  Environmental exposures? No  Contacts with similar? No  Recent travel? No  Other associated symptoms: none Therapies tried thus far: OTC analgesics without pain relief  Arthralgia or myalgia? none Recent illness? none Fever? none New medications? none No specific aggravating or alleviating factors reported.  ROS: As per HPI. All other systems negative.   OBJECTIVE: Vitals:   01/19/19 1144  BP: (!) 143/78  Pulse: 95  Resp: 17  Temp: 98.3 F (36.8 C)  TempSrc: Oral  SpO2: 100%    General appearance: alert; no distress HEENT: [place]; AT Neck: supple with FROM Lungs: clear to auscultation bilaterally Heart: regular rate and rhythm Extremities: no edema; moves all extremities normally Skin: warm and dry; signs of infection: no; crops of red/purplish papules over inner R arm and over upper R back consistent with herpes zoster; some areas with crusting; no bleeding or drainage Psychological: alert and cooperative; normal mood and affect  Allergies  Allergen Reactions  . Digoxin And Related Diarrhea and Other (See Comments)    TOXIC DRUG LEVELS Patient stated she almost died. Had flu like symptoms as well as diarrhea.  . Hydralazine Shortness Of Breath  . Penicillins Cross Reactors Hives and Other (See Comments)    HIGH FEVER  Has patient had a PCN reaction causing immediate rash, facial/tongue/throat swelling, SOB or lightheadedness with hypotension: No Has patient had a PCN reaction causing SEVERE RASH INVOLVING MUCUS MEMBRANES or SKIN NECROSIS  #  #  #  YES  #  #  #  Has patient had a PCN reaction that required hospitalization: Unk Has patient had a PCN reaction occurring within the last 10 years: Unk If all of the above answers are "NO", then may proceed with Cephalosporin use.   [name] Lisinopril Other (See Comments)    Felt like she had  the flu. Was very sick!!!  . Adhesive [Tape] Rash and Other (See Comments)    bruising    Past Medical History:  Diagnosis Date  . Anemia   . Anginal pain ([place])   . Anxiety   . Arthritis    "stiff fingers and knees" (08/04/2013), (12/12/2014)  . Asthma   . CAD (coronary artery disease) 2002; 2015   CABG  x 6 2002, cath 2011- med Rx stent DES VG-Diag  . CAD (coronary artery disease) of artery bypass graft; DES to VG-Diag 09/28/13 11/09/2013  . Cataract   . CHF (congestive heart failure) ([place])    "in 2002" (11/26/2012)  . Chronic bronchitis ([place])    "q year; in the winter"   . CKD (chronic kidney disease)    stage 4, followed by [facility]  . Coronary artery disease 2002   CABG x 6. Cath 5/11- med Rx  . Diabetic neuropathy ([place])   . GERD (gastroesophageal reflux disease)   . Gout    "right big toe"  . Headache    "~ q week" (08/04/2013); "~ twice/month" (12/12/2014)  . History of blood transfusion 2002   "when I had OHS"  . Hyperlipidemia   . Hypertension   . Hypothyroid    treated  . Migraines    "couple times/year" (08/04/2013), (12/12/2014)  . Myocardial infarction ([place]) 2000; 2002; 2011  . Obesity (BMI 35.0-39.9 without comorbidity)   . Peripheral vascular disease ([place]) 12/12   LSFA PTA  . Pneumonia    "3 times I think" (12/12/2014)  . Stroke [facility])    " mini stroke"  . Type II diabetes mellitus ([place])    Social History   Socioeconomic History  . Marital status: Divorced    Spouse name: Not on file  . Number of children: 2  . Years of education: 14  . Highest education level: Not on file  Occupational History  . Occupation: Disabled  Social Needs  . Financial resource strain: Not on file  . Food insecurity    Worry: Not on file    Inability: Not on file  . Transportation needs    Medical: Not on file    Non-medical: Not on file  Tobacco Use  . Smoking status: Former Smoker    Packs/day: 0.00    Years: 25.00    Pack years: 0.00    Types: Cigarettes    Quit date: 04/04/2000    Years since quitting: 18.8  . Smokeless tobacco: Never Used  Substance and Sexual Activity  . Alcohol use: No    Comment: 12/12/2014  "have a glass of red wine on my birthday q yr; that's it"  . Drug use: No  . Sexual activity: Not Currently    Birth control/protection: Abstinence   Lifestyle  . Physical activity    Days per week: Not on file    Minutes per session: Not on file  . Stress: Not on file  Relationships  . Social Herbalist on phone: Not on file    Gets together: Not on file    Attends religious service: Not on file    Active member of club or organization: Not on file    Attends meetings of clubs or organizations: Not on file    Relationship status: Not on file  . Intimate partner violence    Fear of current or ex partner: Not on file    Emotionally abused: Not on file    Physically abused: Not  on file    Forced sexual activity: Not on file  Other Topics Concern  . Not on file  Social History Narrative   Lives at home with a roommate.   Right-handed.   Occasional caffeine use.   Her 98 year son was shot to death in 2015-08-22.   Family History  Problem Relation Age of Onset  . Diabetes Mother   . Hypertension Mother   . Stroke Mother   . Hypertension Father   . Hypertension Brother   . Hypertension Sister   . Diabetes Sister   . Hyperlipidemia Sister    Past Surgical History:  Procedure Laterality Date  . ABDOMINAL AORTAGRAM N/A 04/05/2011   Procedure: ABDOMINAL AORTAGRAM;  Surgeon: Lorretta Harp, MD;  Location: [facility] CATH LAB;  Service: Cardiovascular;  Laterality: N/A;  . ABDOMINAL AORTOGRAM N/A 10/30/2016   Procedure: Abdominal Aortogram;  Surgeon: Wellington Hampshire, MD;  Location: [place] CV LAB;  Service: Cardiovascular;  Laterality: N/A;  . APPENDECTOMY  1980  . AV FISTULA PLACEMENT Left 05/10/2016   Procedure: LEFT ARM ARTERIOVENOUS (AV) FISTULA CREATION;  Surgeon: Rosetta Posner, MD;  Location: [place];  Service: Vascular;  Laterality: Left;  . BASCILIC VEIN TRANSPOSITION Left 06/26/2016   Procedure: SECOND STAGE BASILIC VEIN TRANSPOSITION;  Surgeon: Rosetta Posner, MD;  Location: [place];  Service: Vascular;  Laterality: Left;  . BREAST CYST EXCISION Right 1970's  . CARDIAC CATHETERIZATION  2002  . CARDIAC  CATHETERIZATION N/A 12/12/2014   Procedure: Left Heart Cath and Cors/Grafts Angiography;  Surgeon: Peter M Martinique, MD;  Location: [place] CV LAB;  Service: Cardiovascular;  Laterality: N/A;  . CARDIAC CATHETERIZATION N/A 12/12/2014   Procedure: Coronary Stent Intervention;  Surgeon: Peter M Martinique, MD;  Location: [place] CV LAB;  Service: Cardiovascular;  Laterality: N/A;  . CATARACT EXTRACTION, BILATERAL    . CESAREAN SECTION  1978; 1980  . CHOLECYSTECTOMY  1982  . COLONOSCOPY WITH PROPOFOL N/A 11/03/2017   Procedure: COLONOSCOPY WITH PROPOFOL;  Surgeon: Doran Stabler, MD;  Location: [facility] ENDOSCOPY;  Service: Gastroenterology;  Laterality: N/A;  . CORONARY ANGIOPLASTY WITH STENT PLACEMENT  2004; 2012   "I have 2 stents" (08/04/2013)  . CORONARY ANGIOPLASTY WITH STENT PLACEMENT  09/28/13   PTCA/ DES Xience stent to VG-Diag   . CORONARY ARTERY BYPASS GRAFT  11/20/2000   x6 LIMA to distal LAD, svg to first diag, svg to ramus intermediate branch and swquential SVG to cir marginal branch, SVG to posterior descending coronary and sequential SVG to first right posterolateral branch  . eye injections    . INSERTION OF DIALYSIS CATHETER Right 05/10/2016   Procedure: INSERTION OF DIALYSIS CATHETER - Right Internal Jugular Placement;  Surgeon: Rosetta Posner, MD;  Location: [place];  Service: Vascular;  Laterality: Right;  . LEFT HEART CATHETERIZATION WITH CORONARY/GRAFT ANGIOGRAM N/A 09/28/2013   Procedure: LEFT HEART CATHETERIZATION WITH Beatrix Fetters;  Surgeon: Peter M Martinique, MD;  Location: [facility] CATH LAB;  Service: Cardiovascular;  Laterality: N/A;  . LOWER EXTREMITY ANGIOGRAM  12/01/2012   Procedure: LOWER EXTREMITY ANGIOGRAM;  Surgeon: Lorretta Harp, MD;  Location: [facility] CATH LAB;  Service: Cardiovascular;;  . LOWER EXTREMITY ANGIOGRAPHY Right 10/30/2016   Procedure: Lower Extremity Angiography;  Surgeon: Wellington Hampshire, MD;  Location: [place] CV LAB;  Service: Cardiovascular;   Laterality: Right;  . NM MYOCAR PERF WALL MOTION  08/27/2004   negative  . PERCUTANEOUS STENT INTERVENTION Left 12/01/2012  Procedure: PERCUTANEOUS STENT INTERVENTION;  Surgeon: Lorretta Harp, MD;  Location: [facility] CATH LAB;  Service: Cardiovascular;  Laterality: Left;  Left SFA  . PERIPHERAL ARTERIAL STENT GRAFT Left    SFA/notes 04/07/2011 (11/30/2012)  . PERIPHERAL VASCULAR ATHERECTOMY Right 10/30/2016   Procedure: Peripheral Vascular Atherectomy;  Surgeon: Wellington Hampshire, MD;  Location: [place] CV LAB;  Service: Cardiovascular;  Laterality: Right;  cancel unable to  . PERIPHERAL VASCULAR INTERVENTION Right 10/30/2016   Procedure: Peripheral Vascular Intervention;  Surgeon: Wellington Hampshire, MD;  Location: [facility] CV LAB;  Service: Cardiovascular;  Laterality: Right;  SFA  . POLYPECTOMY  11/03/2017   Procedure: POLYPECTOMY;  Surgeon: Doran Stabler, MD;  Location: [facility];  Service: Gastroenterology;;  . RENAL ANGIOGRAM N/A 04/05/2011   Procedure: RENAL ANGIOGRAM;  Surgeon: Lorretta Harp, MD;  Location: [facility] CATH LAB;  Service: Cardiovascular;  Laterality: N/A;  . TUBAL LIGATION  1980     Vanessa Kick, MD 01/19/19 1257

## 2019-01-19 NOTE — ED Triage Notes (Signed)
Pt presents with right side pain from shingles rash under right arm, on top right arm and on back for over a week.

## 2019-01-19 NOTE — Discharge Instructions (Signed)
Be aware, pain medications may cause drowsiness. Please do not drive, operate heavy machinery or make important decisions while on this medication, it can cloud your judgement.  

## 2019-01-20 DIAGNOSIS — Z992 Dependence on renal dialysis: Secondary | ICD-10-CM | POA: Diagnosis not present

## 2019-01-20 DIAGNOSIS — D509 Iron deficiency anemia, unspecified: Secondary | ICD-10-CM | POA: Diagnosis not present

## 2019-01-20 DIAGNOSIS — N186 End stage renal disease: Secondary | ICD-10-CM | POA: Diagnosis not present

## 2019-01-20 DIAGNOSIS — E1129 Type 2 diabetes mellitus with other diabetic kidney complication: Secondary | ICD-10-CM | POA: Diagnosis not present

## 2019-01-20 DIAGNOSIS — D631 Anemia in chronic kidney disease: Secondary | ICD-10-CM | POA: Diagnosis not present

## 2019-01-20 DIAGNOSIS — N2581 Secondary hyperparathyroidism of renal origin: Secondary | ICD-10-CM | POA: Diagnosis not present

## 2019-01-23 DIAGNOSIS — Z992 Dependence on renal dialysis: Secondary | ICD-10-CM | POA: Diagnosis not present

## 2019-01-23 DIAGNOSIS — N186 End stage renal disease: Secondary | ICD-10-CM | POA: Diagnosis not present

## 2019-01-23 DIAGNOSIS — E1129 Type 2 diabetes mellitus with other diabetic kidney complication: Secondary | ICD-10-CM | POA: Diagnosis not present

## 2019-01-23 DIAGNOSIS — D631 Anemia in chronic kidney disease: Secondary | ICD-10-CM | POA: Diagnosis not present

## 2019-01-23 DIAGNOSIS — N2581 Secondary hyperparathyroidism of renal origin: Secondary | ICD-10-CM | POA: Diagnosis not present

## 2019-01-23 DIAGNOSIS — D509 Iron deficiency anemia, unspecified: Secondary | ICD-10-CM | POA: Diagnosis not present

## 2019-01-25 DIAGNOSIS — D631 Anemia in chronic kidney disease: Secondary | ICD-10-CM | POA: Diagnosis not present

## 2019-01-25 DIAGNOSIS — D509 Iron deficiency anemia, unspecified: Secondary | ICD-10-CM | POA: Diagnosis not present

## 2019-01-25 DIAGNOSIS — N186 End stage renal disease: Secondary | ICD-10-CM | POA: Diagnosis not present

## 2019-01-25 DIAGNOSIS — N2581 Secondary hyperparathyroidism of renal origin: Secondary | ICD-10-CM | POA: Diagnosis not present

## 2019-01-25 DIAGNOSIS — Z992 Dependence on renal dialysis: Secondary | ICD-10-CM | POA: Diagnosis not present

## 2019-01-25 DIAGNOSIS — E1129 Type 2 diabetes mellitus with other diabetic kidney complication: Secondary | ICD-10-CM | POA: Diagnosis not present

## 2019-01-26 ENCOUNTER — Ambulatory Visit: Payer: Medicare Other | Admitting: Cardiovascular Disease

## 2019-01-26 ENCOUNTER — Ambulatory Visit: Payer: Medicare Other | Admitting: Family Medicine

## 2019-01-27 ENCOUNTER — Ambulatory Visit: Payer: Medicare Other | Admitting: Family Medicine

## 2019-01-27 DIAGNOSIS — Z992 Dependence on renal dialysis: Secondary | ICD-10-CM | POA: Diagnosis not present

## 2019-01-27 DIAGNOSIS — N186 End stage renal disease: Secondary | ICD-10-CM | POA: Diagnosis not present

## 2019-01-27 DIAGNOSIS — E1129 Type 2 diabetes mellitus with other diabetic kidney complication: Secondary | ICD-10-CM | POA: Diagnosis not present

## 2019-01-27 DIAGNOSIS — N2581 Secondary hyperparathyroidism of renal origin: Secondary | ICD-10-CM | POA: Diagnosis not present

## 2019-01-27 DIAGNOSIS — D631 Anemia in chronic kidney disease: Secondary | ICD-10-CM | POA: Diagnosis not present

## 2019-01-27 DIAGNOSIS — D509 Iron deficiency anemia, unspecified: Secondary | ICD-10-CM | POA: Diagnosis not present

## 2019-01-28 ENCOUNTER — Ambulatory Visit (HOSPITAL_COMMUNITY)
Admission: EM | Admit: 2019-01-28 | Discharge: 2019-01-28 | Disposition: A | Discharge status: Home / Self Care | Payer: Medicare Other | Attending: Family Medicine | Admitting: Family Medicine

## 2019-01-28 ENCOUNTER — Encounter (HOSPITAL_COMMUNITY): Payer: Self-pay

## 2019-01-28 ENCOUNTER — Other Ambulatory Visit: Payer: Self-pay

## 2019-01-28 DIAGNOSIS — B029 Zoster without complications: Secondary | ICD-10-CM

## 2019-01-28 DIAGNOSIS — K5903 Drug induced constipation: Secondary | ICD-10-CM | POA: Diagnosis not present

## 2019-01-28 DIAGNOSIS — F410 Panic disorder [episodic paroxysmal anxiety] without agoraphobia: Secondary | ICD-10-CM | POA: Diagnosis not present

## 2019-01-28 MED ORDER — KETOROLAC TROMETHAMINE 30 MG/ML IJ SOLN
INTRAMUSCULAR | Status: AC
  Filled 2019-01-28: qty 1

## 2019-01-28 MED ORDER — HYDROXYZINE HCL 25 MG PO TABS: 12.5000 mg | ORAL_TABLET | Freq: Every day | ORAL | 0 refills | Status: DC | PRN

## 2019-01-28 MED ORDER — KETOROLAC TROMETHAMINE 30 MG/ML IJ SOLN
30.0000 mg | Freq: Once | INTRAMUSCULAR | Status: AC
  Administered 2019-01-28: 30 mg via INTRAMUSCULAR

## 2019-01-28 NOTE — Discharge Instructions (Addendum)
Stop taking hydrocodone. This is the likely cause of your constipation. You may begin using over the counter Miralax.

## 2019-01-28 NOTE — ED Triage Notes (Signed)
Pt presents to UC w/ c/o having an anxiety attack today. Pt states she took her hydroxizine pills today and she felt better after that. Pt also states she feels her constipation, congestion, and anxiety are all due to her current shingles and would "like a shot" to help.

## 2019-01-29 DIAGNOSIS — D509 Iron deficiency anemia, unspecified: Secondary | ICD-10-CM | POA: Diagnosis not present

## 2019-01-29 DIAGNOSIS — N186 End stage renal disease: Secondary | ICD-10-CM | POA: Diagnosis not present

## 2019-01-29 DIAGNOSIS — Z992 Dependence on renal dialysis: Secondary | ICD-10-CM | POA: Diagnosis not present

## 2019-01-29 DIAGNOSIS — N2581 Secondary hyperparathyroidism of renal origin: Secondary | ICD-10-CM | POA: Diagnosis not present

## 2019-01-29 DIAGNOSIS — E1129 Type 2 diabetes mellitus with other diabetic kidney complication: Secondary | ICD-10-CM | POA: Diagnosis not present

## 2019-01-29 DIAGNOSIS — D631 Anemia in chronic kidney disease: Secondary | ICD-10-CM | POA: Diagnosis not present

## 2019-02-01 DIAGNOSIS — E1129 Type 2 diabetes mellitus with other diabetic kidney complication: Secondary | ICD-10-CM | POA: Diagnosis not present

## 2019-02-01 DIAGNOSIS — Z992 Dependence on renal dialysis: Secondary | ICD-10-CM | POA: Diagnosis not present

## 2019-02-01 DIAGNOSIS — N186 End stage renal disease: Secondary | ICD-10-CM | POA: Diagnosis not present

## 2019-02-01 DIAGNOSIS — N2581 Secondary hyperparathyroidism of renal origin: Secondary | ICD-10-CM | POA: Diagnosis not present

## 2019-02-01 DIAGNOSIS — D509 Iron deficiency anemia, unspecified: Secondary | ICD-10-CM | POA: Diagnosis not present

## 2019-02-01 DIAGNOSIS — D631 Anemia in chronic kidney disease: Secondary | ICD-10-CM | POA: Diagnosis not present

## 2019-02-02 ENCOUNTER — Encounter: Payer: Self-pay | Admitting: Family Medicine

## 2019-02-02 ENCOUNTER — Telehealth: Payer: Self-pay

## 2019-02-02 ENCOUNTER — Ambulatory Visit: Payer: Medicare Other | Attending: Family Medicine | Admitting: Family Medicine

## 2019-02-02 ENCOUNTER — Other Ambulatory Visit: Payer: Self-pay

## 2019-02-02 VITALS — BP 118/70 | HR 87 | Temp 98.0°F | Ht 70.5 in | Wt 227.0 lb

## 2019-02-02 DIAGNOSIS — M2559 Pain in other specified joint: Secondary | ICD-10-CM | POA: Diagnosis not present

## 2019-02-02 DIAGNOSIS — M10079 Idiopathic gout, unspecified ankle and foot: Secondary | ICD-10-CM | POA: Diagnosis not present

## 2019-02-02 DIAGNOSIS — F419 Anxiety disorder, unspecified: Secondary | ICD-10-CM | POA: Insufficient documentation

## 2019-02-02 DIAGNOSIS — M109 Gout, unspecified: Secondary | ICD-10-CM | POA: Diagnosis not present

## 2019-02-02 DIAGNOSIS — N186 End stage renal disease: Secondary | ICD-10-CM

## 2019-02-02 DIAGNOSIS — G44209 Tension-type headache, unspecified, not intractable: Secondary | ICD-10-CM

## 2019-02-02 DIAGNOSIS — Z833 Family history of diabetes mellitus: Secondary | ICD-10-CM | POA: Insufficient documentation

## 2019-02-02 DIAGNOSIS — Z7901 Long term (current) use of anticoagulants: Secondary | ICD-10-CM | POA: Diagnosis not present

## 2019-02-02 DIAGNOSIS — I2510 Atherosclerotic heart disease of native coronary artery without angina pectoris: Secondary | ICD-10-CM

## 2019-02-02 DIAGNOSIS — M2550 Pain in unspecified joint: Secondary | ICD-10-CM | POA: Diagnosis not present

## 2019-02-02 DIAGNOSIS — E039 Hypothyroidism, unspecified: Secondary | ICD-10-CM | POA: Diagnosis not present

## 2019-02-02 DIAGNOSIS — Z7902 Long term (current) use of antithrombotics/antiplatelets: Secondary | ICD-10-CM | POA: Diagnosis not present

## 2019-02-02 DIAGNOSIS — Z79899 Other long term (current) drug therapy: Secondary | ICD-10-CM | POA: Diagnosis not present

## 2019-02-02 DIAGNOSIS — R42 Dizziness and giddiness: Secondary | ICD-10-CM

## 2019-02-02 DIAGNOSIS — E1122 Type 2 diabetes mellitus with diabetic chronic kidney disease: Secondary | ICD-10-CM | POA: Insufficient documentation

## 2019-02-02 DIAGNOSIS — Z794 Long term (current) use of insulin: Secondary | ICD-10-CM | POA: Insufficient documentation

## 2019-02-02 LAB — POCT GLYCOSYLATED HEMOGLOBIN (HGB A1C): HbA1c, POC (controlled diabetic range): 4.9 % (ref 0.0–7.0)

## 2019-02-02 LAB — GLUCOSE, POCT (MANUAL RESULT ENTRY): POC Glucose: 100 mg/dl — AB (ref 70–99)

## 2019-02-02 MED ORDER — ALLOPURINOL 300 MG PO TABS: ORAL_TABLET | ORAL | 1 refills | Status: AC

## 2019-02-02 MED ORDER — AMITRIPTYLINE HCL 10 MG PO TABS: ORAL_TABLET | ORAL | 3 refills | Status: AC

## 2019-02-02 MED ORDER — HYDROXYZINE HCL 25 MG PO TABS: 25.0000 mg | ORAL_TABLET | Freq: Three times a day (TID) | ORAL | 3 refills | Status: AC | PRN

## 2019-02-02 MED ORDER — BUSPIRONE HCL 10 MG PO TABS: 10.0000 mg | ORAL_TABLET | Freq: Two times a day (BID) | ORAL | 1 refills | Status: AC

## 2019-02-02 MED ORDER — TRAMADOL HCL 50 MG PO TABS: 50.0000 mg | ORAL_TABLET | Freq: Two times a day (BID) | ORAL | 1 refills | Status: AC | PRN

## 2019-02-02 NOTE — Progress Notes (Signed)
Patient states that she has been having panic attacks.  Patient has been getting dizzy and has had some falls.  Patient is having pain in wrist,collar bone and right side due to fall.

## 2019-02-02 NOTE — Patient Outreach (Signed)
[facility]) Care Management  01/07/2019  Brooke Stafford Aug 05, 1955 [id number]  RN Health Coach attempted follow up outreach call to patient.  Patient was unavailable. HIPPA compliance voicemail message left with return callback number.  Plan: RN will call patient again within 30 days.  [place] Care Management [phone]

## 2019-02-02 NOTE — Patient Instructions (Signed)
Panic Attack  A panic attack is when you suddenly feel very afraid, uncomfortable, or nervous (anxious). A panic attack can happen when you are scared or for no reason. A panic attack can feel like a serious problem. It can even feel like a heart attack or stroke. See your doctor when you have a panic attack to make sure you do not have a serious problem. Follow these instructions at home:  Take medicines only as told by your doctor.  If you feel worried or nervous, try not to have caffeine.  Take good care of your health. To do this: ? Eat healthy. Make sure to eat fresh fruits and vegetables, whole grains, lean meats, and low-fat dairy. ? Get enough sleep. Try to sleep for 7-8 hours each night. ? Exercise. Try to be active for 30 minutes 5 or more days a week. ? Do not smoke. Talk to your doctor if you need help quitting. ? Limit how much alcohol you drink:  If you are a woman who is not pregnant: try not to have more than 1 drink a day.  If you are a man: try not to have more than 2 drinks a day.  One drink equals 12 oz of beer, 5 oz of wine, or 1 oz of hard liquor.  Keep all follow-up visits as told by your doctor. This is important. Contact a doctor if:  Your symptoms do not get better.  Your symptoms get worse.  You are not able to take your medicines as told. Get help right away if:  You have thoughts of hurting yourself or others.  You have symptoms of a panic attack. Do not drive yourself to the hospital. Have someone else drive you or call an ambulance. If you feel like you may hurt yourself or others, or have thoughts about taking your own life, get help right away. You can go to your nearest emergency department or call:  Your local emergency services (911 in the [place]  A suicide crisis helpline, such as the [facility] at 9290768511. This is open 24 hours a day. Summary  A panic attack is when you suddenly feel very afraid,  uncomfortable, or nervous (anxious).  See your doctor when you have a panic attack to make sure that you do not have another serious problem.  If you feel like you may hurt yourself or others, get help right away by calling 911. This information is not intended to replace advice given to you by your health care provider. Make sure you discuss any questions you have with your health care provider. Document Released: 05/04/2010 Document Revised: 03/14/2017 Document Reviewed: 05/15/2016 Elsevier Patient Education  2020 [employer].
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
## 2019-02-02 NOTE — Progress Notes (Signed)
 Subjective:  Patient ID: Brooke Stafford, female    DOB: 12/09/1955  Age: 63 y.o. MRN: [id number]  CC: Diabetes   HPI Brooke Stafford is a 63-year-old female with a history of type 2 diabetes mellitus (A1c 5.2), hypertension, CHF (2-D echo 35 to 40% from 2-D echo of 04/2016), CAD status post PCI ,End-stage renal disease (currently on hemodialysis on Mondays, Wednesdays and Fridays), peripheral vascular disease (status post left SFA stent placement in 2012 and 2014 and right SFA and popliteal artery angioplasty and stent placement), rheumatoid arthritis here for a follow-up visit. She complains of dizziness today which has resulted in falls the last of which occurred when she was attempting to rise from a bent position.  She denies a sense of the room spinning or tinnitus or hearing loss.  She has had vertigo in the past and this felt different. On inquiring if she had checked her blood pressure or blood sugar at the time she states she had not. She has also had panic attacks and anxiety which worsened ever since her younger sister was diagnosed with a stroke.  Her sister is now doing better but panic attacks still persist and occurs when she is walking but are absent at rest.  She has been out of BuSpar but has been compliant with hydroxyzine for her underlying generalized anxiety disorder.  Previously on Prozac which she did not do well on. She complains of right-sided pain and was recently treated for shingles which had occurred in her posterior right scapula and the right upper extremity.  Pain has occurred ever since she fell and hit her right side.  She also has residual pain in the areas of his shingles lesions which have resolved except for the presence of scars.  Past Medical History:  Diagnosis Date  . Anemia   . Anginal pain (HCC)   . Anxiety   . Arthritis    "stiff fingers and knees" (08/04/2013), (12/12/2014)  . Asthma   . CAD (coronary artery disease) 2002; 2015   CABG x 6 2002,  cath 2011- med Rx stent DES VG-Diag  . CAD (coronary artery disease) of artery bypass graft; DES to VG-Diag 09/28/13 11/09/2013  . Cataract   . CHF (congestive heart failure) (HCC)    "in 2002" (11/26/2012)  . Chronic bronchitis (HCC)    "q year; in the winter"   . CKD (chronic kidney disease)    stage 4, followed by [facility]  . Coronary artery disease 2002   CABG x 6. Cath 5/11- med Rx  . Diabetic neuropathy (HCC)   . GERD (gastroesophageal reflux disease)   . Gout    "right big toe"  . Headache    "~ q week" (08/04/2013); "~ twice/month" (12/12/2014)  . History of blood transfusion 2002   "when I had OHS"  . Hyperlipidemia   . Hypertension   . Hypothyroid    treated  . Migraines    "couple times/year" (08/04/2013), (12/12/2014)  . Myocardial infarction (HCC) 2000; 2002; 2011  . Obesity (BMI 35.0-39.9 without comorbidity)   . Peripheral vascular disease (HCC) 12/12   LSFA PTA  . Pneumonia    "3 times I think" (12/12/2014)  . Stroke (HCC)    " mini stroke"  . Type II diabetes mellitus (HCC)     Past Surgical History:  Procedure Laterality Date  . ABDOMINAL AORTAGRAM N/A 04/05/2011   Procedure: ABDOMINAL AORTAGRAM;  Surgeon: Jonathan J Berry, MD;  Location: [facility];    Service: Cardiovascular;  Laterality: N/A;  . ABDOMINAL AORTOGRAM N/A 10/30/2016   Procedure: Abdominal Aortogram;  Surgeon: Arida, Muhammad A, MD;  Location: [facility];  Service: Cardiovascular;  Laterality: N/A;  . APPENDECTOMY  1980  . AV FISTULA PLACEMENT Left 05/10/2016   Procedure: LEFT ARM ARTERIOVENOUS (AV) FISTULA CREATION;  Surgeon: Todd F Early, MD;  Location: [facility];  Service: Vascular;  Laterality: Left;  . BASCILIC VEIN TRANSPOSITION Left 06/26/2016   Procedure: SECOND STAGE BASILIC VEIN TRANSPOSITION;  Surgeon: Todd F Early, MD;  Location: [facility];  Service: Vascular;  Laterality: Left;  . BREAST CYST EXCISION Right 1970's  . CARDIAC CATHETERIZATION  2002  . CARDIAC CATHETERIZATION N/A  12/12/2014   Procedure: Left Heart Cath and Cors/Grafts Angiography;  Surgeon: Peter M Jordan, MD;  [facility]: [facility];  Service: Cardiovascular;  Laterality: N/A;  . CARDIAC CATHETERIZATION N/A 12/12/2014   Procedure: Coronary Stent Intervention;  Surgeon: Peter M Jordan, MD;  [facility]: [facility];  Service: Cardiovascular;  Laterality: N/A;  . CATARACT EXTRACTION, BILATERAL    . CESAREAN SECTION  1978; 1980  . CHOLECYSTECTOMY  1982  . COLONOSCOPY WITH PROPOFOL N/A 11/03/2017   Procedure: COLONOSCOPY WITH PROPOFOL;  Surgeon: Danis, Henry L III, MD;  Location: [facility];  Service: Gastroenterology;  Laterality: N/A;  . CORONARY ANGIOPLASTY WITH STENT PLACEMENT  2004; 2012   "I have 2 stents" (08/04/2013)  . CORONARY ANGIOPLASTY WITH STENT PLACEMENT  09/28/13   PTCA/ DES Xience stent to VG-Diag   . CORONARY ARTERY BYPASS GRAFT  11/20/2000   x6 LIMA to distal LAD, svg to first diag, svg to ramus intermediate branch and swquential SVG to cir marginal branch, SVG to posterior descending coronary and sequential SVG to first right posterolateral branch  . eye injections    . INSERTION OF DIALYSIS CATHETER Right 05/10/2016   Procedure: INSERTION OF DIALYSIS CATHETER - Right Internal Jugular Placement;  Surgeon: Todd F Early, MD;  Location: [facility];  Service: Vascular;  Laterality: Right;  . LEFT HEART CATHETERIZATION WITH CORONARY/GRAFT ANGIOGRAM N/A 09/28/2013   Procedure: LEFT HEART CATHETERIZATION WITH CORONARY/GRAFT ANGIOGRAM;  Surgeon: Peter M Jordan, MD;  [facility]: [facility];  Service: Cardiovascular;  Laterality: N/A;  . LOWER EXTREMITY ANGIOGRAM  12/01/2012   Procedure: LOWER EXTREMITY ANGIOGRAM;  Surgeon: Jonathan J Berry, MD;  [facility]: [facility];  Service: Cardiovascular;;  . LOWER EXTREMITY ANGIOGRAPHY Right 10/30/2016   Procedure: Lower Extremity Angiography;  Surgeon: Arida, Muhammad A, MD;  [facility]: [facility];  Service: Cardiovascular;  Laterality: Right;  . NM  MYOCAR PERF WALL MOTION  08/27/2004   negative  . PERCUTANEOUS STENT INTERVENTION Left 12/01/2012   Procedure: PERCUTANEOUS STENT INTERVENTION;  Surgeon: Jonathan J Berry, MD;  [facility]: [facility];  Service: Cardiovascular;  Laterality: Left;  Left SFA  . PERIPHERAL ARTERIAL STENT GRAFT Left    SFA/notes 04/07/2011 (11/30/2012)  . PERIPHERAL VASCULAR ATHERECTOMY Right 10/30/2016   Procedure: Peripheral Vascular Atherectomy;  Surgeon: Arida, Muhammad A, MD;  [facility]: [facility];  Service: Cardiovascular;  Laterality: Right;  cancel unable to  . PERIPHERAL VASCULAR INTERVENTION Right 10/30/2016   Procedure: Peripheral Vascular Intervention;  Surgeon: Arida, Muhammad A, MD;  [facility];  Service: Cardiovascular;  Laterality: Right;  SFA  . POLYPECTOMY  11/03/2017   Procedure: POLYPECTOMY;  Surgeon: Danis, Henry L III, MD;  Location: [facility];  Service: Gastroenterology;;  . RENAL ANGIOGRAM N/A 04/05/2011     Procedure: RENAL ANGIOGRAM;  Surgeon: Lorretta Harp, MD;  Location: [facility];  Service: Cardiovascular;  Laterality: N/A;  . TUBAL LIGATION  1980    Family History  Problem Relation Age of Onset  . Diabetes Mother   . Hypertension Mother   . Stroke Mother   . Hypertension Father   . Hypertension Brother   . Hypertension Sister   . Diabetes Sister   . Hyperlipidemia Sister     Allergies  Allergen Reactions  . Digoxin And Related Diarrhea and Other (See Comments)    TOXIC DRUG LEVELS Patient stated she almost died. Had flu like symptoms as well as diarrhea.  . Hydralazine Shortness Of Breath  . Penicillins Cross Reactors Hives and Other (See Comments)    HIGH FEVER  Has patient had a PCN reaction causing immediate rash, facial/tongue/throat swelling, SOB or lightheadedness with hypotension: No Has patient had a PCN reaction causing SEVERE RASH INVOLVING MUCUS MEMBRANES or SKIN NECROSIS  #  #  #  YES  #  #  #  Has patient had a PCN reaction that  required hospitalization: Unk Has patient had a PCN reaction occurring within the last 10 years: Unk If all of the above answers are "NO", then may proceed with Cephalosporin use.   [name] Lisinopril Other (See Comments)    Felt like she had the flu. Was very sick!!!  . Adhesive [Tape] Rash and Other (See Comments)    bruising    Outpatient Medications Prior to Visit  Medication Sig Dispense Refill  . Alcohol Swabs (ALCOHOL PREP) PADS     . aspirin 81 MG chewable tablet Chew 1 tablet (81 mg total) by mouth daily. 30 tablet 10  . atorvastatin (LIPITOR) 40 MG tablet TAKE 1 TABLET(40 MG) BY MOUTH EVERY EVENING 90 tablet 3  . AURYXIA 1 GM 210 MG(Fe) tablet TAKE 2 TABLETS BY MOUTH THREE TIMES A DAY WITH MEALS.   SWALLOW WHOLE, DO NOT CHEW OR CRUSH MEDICATION  1  . Blood Glucose Monitoring Suppl (ACCU-CHEK AVIVA CONNECT) w/Device KIT 16 Units by Does not apply route daily. 1 kit 0  . fluticasone (FLONASE) 50 MCG/ACT nasal spray PLACE 1 SPRAY INTO BOTH NOSTRILS DAILY 16 g 0  . gabapentin (NEURONTIN) 400 MG capsule Take 1 capsule (400 mg total) by mouth 2 (two) times daily. 180 capsule 1  . glucose blood (ACCU-CHEK ACTIVE STRIPS) test strip Use as instructed 100 each 12  . hydroxychloroquine (PLAQUENIL) 200 MG tablet TAKE 2 TABLETS BY MOUTH EVERY DAY WITH FOOD OR MILK  0  . Insulin Glargine (BASAGLAR KWIKPEN) 100 UNIT/ML SOPN INJECT 10 UNITS INTO THE SKIN AT BEDTIME 45 mL 3  . Insulin Pen Needle 32G X 4 MM MISC Use 4x a day 200 each 3  . isosorbide mononitrate (IMDUR) 60 MG 24 hr tablet TAKE 2 TABLETS(120 MG) BY MOUTH DAILY 180 tablet 1  . Lancets (ACCU-CHEK SOFT TOUCH) lancets Use as instructed 100 each 12  . levothyroxine (SYNTHROID) 25 MCG tablet Take 1 tablet (25 mcg total) by mouth daily before breakfast. 90 tablet 1  . lidocaine-prilocaine (EMLA) cream Apply 1 application topically as needed (for port access).   4  . metoCLOPramide (REGLAN) 5 MG tablet Take 1 tablet (5 mg total) by mouth every 8  (eight) hours as needed for nausea. 90 tablet 3  . metoprolol tartrate (LOPRESSOR) 25 MG tablet     . multivitamin (RENA-VIT) TABS tablet Take 1 tablet by mouth daily.    [name]  Na Sulfate-K Sulfate-Mg Sulf 17.5-3.13-1.6 GM/177ML SOLN Suprep-Use as directed 354 mL 0  . nitroGLYCERIN (NITROSTAT) 0.4 MG SL tablet Place 1 tablet (0.4 mg total) under the tongue every 5 (five) minutes as needed. 30 tablet 2  . NOVOLOG FLEXPEN 100 UNIT/ML FlexPen ADMINISTER 5 UNITS UNDER THE SKIN THREE TIMES DAILY WITH MEALS 15 mL 0  . ORENCIA CLICKJECT 329 MG/ML SOAJ     . RENVELA 800 MG tablet TAKE 3 TABLETS BY MOUTH THREE TIMES DAILY WITH MEALS  3  . sevelamer (RENAGEL) 800 MG tablet Take 2,400 mg by mouth 3 (three) times daily with meals.    . triamcinolone cream (KENALOG) 0.1 % Apply 1 application topically 2 (two) times daily. 45 g 1  . valACYclovir (VALTREX) 500 MG tablet Take 500 mg by mouth 2 (two) times daily.    [name] allopurinol (ZYLOPRIM) 300 MG tablet TAKE 1 TABLET(300 MG) BY MOUTH DAILY 90 tablet 1  . amitriptyline (ELAVIL) 10 MG tablet TAKE 1 TABLET(10 MG) BY MOUTH AT BEDTIME 30 tablet 1  . busPIRone (BUSPAR) 10 MG tablet Take 1 tablet (10 mg total) by mouth 2 (two) times daily. 180 tablet 1  . ciprofloxacin (CIPRO) 500 MG tablet Take 1 tablet (500 mg total) by mouth daily with breakfast. Administer after dialysis on hemodialysis days. 7 tablet 0  . hydrOXYzine (ATARAX/VISTARIL) 25 MG tablet Take 0.5 tablets (12.5 mg total) by mouth daily as needed. 30 tablet 0  . traMADol (ULTRAM) 50 MG tablet Take 1 tablet (50 mg total) by mouth every 12 (twelve) hours as needed for moderate pain. 40 tablet 1  . cetirizine (ZYRTEC) 10 MG tablet Take 10 mg by mouth daily.    . clopidogrel (PLAVIX) 75 MG tablet Take 1 tablet (75 mg total) by mouth daily. (Patient not taking: Reported on 02/02/2019) 90 tablet 1  . cyclobenzaprine (FLEXERIL) 10 MG tablet TK 1 T PO QD HS FOR 14 DAYS PRN    . HYDROcodone-acetaminophen  (NORCO/VICODIN) 5-325 MG tablet Take 1 tablet by mouth every 6 (six) hours as needed for moderate pain or severe pain. (Patient not taking: Reported on 02/02/2019) 20 tablet 0  . insulin aspart (NOVOLOG FLEXPEN) 100 UNIT/ML FlexPen Inject 5-8 Units into the skin 3 (three) times daily with meals. (Patient not taking: Reported on 02/02/2019) 15 mL 11  . Insulin Aspart FlexPen 100 UNIT/ML SOPN ADMINISTER 5 UNITS UNDER THE SKIN THREE TIMES DAILY WITH MEALS (Patient not taking: Reported on 02/02/2019) 15 mL 0  . ipratropium (ATROVENT) 0.03 % nasal spray Place 2 sprays into both nostrils every 12 (twelve) hours. (Patient not taking: Reported on 02/02/2019) 30 mL 6  . LINZESS 145 MCG CAPS capsule TAKE 1 CAPSULE(145 MCG) BY MOUTH DAILY BEFORE BREAKFAST (Patient not taking: Reported on 02/02/2019) 30 capsule 0   No facility-administered medications prior to visit.      ROS Review of Systems  Constitutional: Negative for activity change, appetite change and fatigue.  HENT: Negative for congestion, sinus pressure and sore throat.   Eyes: Negative for visual disturbance.  Respiratory: Negative for cough, chest tightness, shortness of breath and wheezing.   Cardiovascular: Negative for chest pain and palpitations.  Gastrointestinal: Negative for abdominal distention, abdominal pain and constipation.  Endocrine: Negative for polydipsia.  Genitourinary: Negative for dysuria and frequency.  Musculoskeletal: Negative for arthralgias and back pain.  Skin: Negative for rash.  Neurological: Positive for light-headedness. Negative for tremors and numbness.  Hematological: Does not bruise/bleed easily.  Psychiatric/Behavioral: Negative for agitation  and behavioral problems. The patient is nervous/anxious.     Objective:  BP 118/70   Pulse 87   Temp 98 F (36.7 C) (Oral)   Ht 5' 10.5" (1.791 m)   Wt 227 lb (103 kg)   LMP 10/13/2014 (Exact Date)   SpO2 97%   BMI 32.11 kg/m   BP/Weight 02/02/2019  01/28/2019 01/19/2019  Systolic BP 118 134 143  Diastolic BP 70 98 78  Wt. (Lbs) 227 - -  BMI 32.11 - -      Physical Exam Constitutional:      Appearance: She is well-developed.  Neck:     Vascular: No JVD.  Cardiovascular:     Rate and Rhythm: Normal rate.     Heart sounds: Normal heart sounds. No murmur.  Pulmonary:     Effort: Pulmonary effort is normal.     Breath sounds: Normal breath sounds. No wheezing or rales.  Chest:     Chest wall: No tenderness.  Abdominal:     General: Bowel sounds are normal. There is no distension.     Palpations: Abdomen is soft. There is no mass.     Tenderness: There is no abdominal tenderness.  Musculoskeletal: Normal range of motion.     Right lower leg: No edema.     Left lower leg: No edema.     Comments: Slight tenderness on palpation of right thorax and right upper extremity  Skin:    Comments: Residual scars from shingles rash on right scapula and right upper arm  Neurological:     Mental Status: She is alert and oriented to person, place, and time.  Psychiatric:     Comments: Anxious mood     CMP Latest Ref Rng & Units 08/20/2018 11/03/2017 03/26/2017  Glucose 65 - 99 mg/dL 136(H) 98 179(H)  BUN 8 - 27 mg/dL 19 - 18  Creatinine 0.57 - 1.00 mg/dL 7.15(H) - 6.28(H)  Sodium 134 - 144 mmol/L 141 138 136  Potassium 3.5 - 5.2 mmol/L 4.1 4.1 3.3(L)  Chloride 96 - 106 mmol/L 92(L) - 92(L)  CO2 20 - 29 mmol/L 27 - 31  Calcium 8.7 - 10.3 mg/dL 9.7 - 8.7(L)  Total Protein 6.0 - 8.5 g/dL 7.8 - -  Total Bilirubin 0.0 - 1.2 mg/dL 0.4 - -  Alkaline Phos 39 - 117 IU/L 93 - -  AST 0 - 40 IU/L 20 - -  ALT 0 - 32 IU/L 17 - -    Lipid Panel     Component Value Date/Time   CHOL 112 08/20/2018 1001   TRIG 117 08/20/2018 1001   HDL 52 08/20/2018 1001   CHOLHDL 2.2 08/20/2018 1001   CHOLHDL 3.6 07/25/2015 2037   VLDL 44 (H) 07/25/2015 2037   LDLCALC 37 08/20/2018 1001    CBC    Component Value Date/Time   WBC 6.1 03/26/2017 2216    RBC 3.64 (L) 03/26/2017 2216   HGB 10.5 (L) 11/03/2017 1003   HGB 11.5 10/22/2016 1151   HCT 31.0 (L) 11/03/2017 1003   HCT 35.2 10/22/2016 1151   PLT 169 03/26/2017 2216   PLT 182 10/22/2016 1151   MCV 102.5 (H) 03/26/2017 2216   MCV 101 (H) 10/22/2016 1151   MCH 33.8 03/26/2017 2216   MCHC 33.0 03/26/2017 2216   RDW 14.8 03/26/2017 2216   RDW 14.9 10/22/2016 1151   LYMPHSABS 2.5 05/02/2016 2145   MONOABS 0.3 05/02/2016 2145   EOSABS 0.6 05/02/2016 2145   BASOSABS 0.0   05/02/2016 2145    Lab Results  Component Value Date   HGBA1C 4.9 02/02/2019    Assessment & Plan:   1. Type 2 diabetes mellitus with ESRD (end-stage renal disease) (HCC) Controlled with A1c of 4.9 Concern for hypoglycemia especially given complaints of lightheadedness Advised to reduce Lantus to 3 units at bedtime She will be discussing with her endocrinologist as Lantus may need to be discontinued altogether - POCT glucose (manual entry) - POCT glycosylated hemoglobin (Hb A1C)  2. Anxiety Uncontrolled Exacerbated by younger sister diagnosed with stroke She has been out of BuSpar which I have refilled and advised to continue with hydroxyzine She declines initiation of an SSRI as she did not do well with Prozac in the past Will benefit from psychotherapy and has been referred to LCSW - busPIRone (BUSPAR) 10 MG tablet; Take 1 tablet (10 mg total) by mouth 2 (two) times daily.  Dispense: 180 tablet; Refill: 1  3. Gout of big toe Stable - allopurinol (ZYLOPRIM) 300 MG tablet; TAKE 1 TABLET(300 MG) BY MOUTH DAILY  Dispense: 90 tablet; Refill: 1  4. Pain in other joint Controlled - traMADol (ULTRAM) 50 MG tablet; Take 1 tablet (50 mg total) by mouth every 12 (twelve) hours as needed for moderate pain.  Dispense: 40 tablet; Refill: 1  5. Acute non intractable tension-type headache Stable - amitriptyline (ELAVIL) 10 MG tablet; TAKE 1 TABLET(10 MG) BY MOUTH AT BEDTIME  Dispense: 30 tablet; Refill: 3  6.  Dizziness Suspicious for hypoglycemia versus hypertension She does not have her med list with her as antihypertensives are managed by her nephrologist and we do not have the correct dosing Advised to bring in medications at next visit  7. Hypothyroidism, acquired Controlled - T4, free - TSH    Meds ordered this encounter  Medications  . busPIRone (BUSPAR) 10 MG tablet    Sig: Take 1 tablet (10 mg total) by mouth 2 (two) times daily.    Dispense:  180 tablet    Refill:  1  . allopurinol (ZYLOPRIM) 300 MG tablet    Sig: TAKE 1 TABLET(300 MG) BY MOUTH DAILY    Dispense:  90 tablet    Refill:  1  . traMADol (ULTRAM) 50 MG tablet    Sig: Take 1 tablet (50 mg total) by mouth every 12 (twelve) hours as needed for moderate pain.    Dispense:  40 tablet    Refill:  1  . hydrOXYzine (ATARAX/VISTARIL) 25 MG tablet    Sig: Take 1 tablet (25 mg total) by mouth every 8 (eight) hours as needed.    Dispense:  90 tablet    Refill:  3  . amitriptyline (ELAVIL) 10 MG tablet    Sig: TAKE 1 TABLET(10 MG) BY MOUTH AT BEDTIME    Dispense:  30 tablet    Refill:  3    Follow-up: Return in about 3 months (around 05/05/2019) for chronic medical conditions; schedule with Jasmine- panic attacks.       Enobong Newlin, MD, FAAFP. [facility] [place], [name] [phone]   02/02/2019, 2:48 PM 

## 2019-02-02 NOTE — Telephone Encounter (Signed)
Met with the patient when she was in the clinic today and completed a [employer] application. This was a a renewal as she has been using [employer] x 2 years.  Completed application faxed to [employer] eligibility

## 2019-02-03 ENCOUNTER — Encounter: Payer: Self-pay | Admitting: Nurse Practitioner

## 2019-02-03 DIAGNOSIS — D509 Iron deficiency anemia, unspecified: Secondary | ICD-10-CM | POA: Diagnosis not present

## 2019-02-03 DIAGNOSIS — N2581 Secondary hyperparathyroidism of renal origin: Secondary | ICD-10-CM | POA: Diagnosis not present

## 2019-02-03 DIAGNOSIS — Z992 Dependence on renal dialysis: Secondary | ICD-10-CM | POA: Diagnosis not present

## 2019-02-03 DIAGNOSIS — E1129 Type 2 diabetes mellitus with other diabetic kidney complication: Secondary | ICD-10-CM | POA: Diagnosis not present

## 2019-02-03 DIAGNOSIS — I499 Cardiac arrhythmia, unspecified: Secondary | ICD-10-CM | POA: Diagnosis not present

## 2019-02-03 DIAGNOSIS — D631 Anemia in chronic kidney disease: Secondary | ICD-10-CM | POA: Diagnosis not present

## 2019-02-03 DIAGNOSIS — N186 End stage renal disease: Secondary | ICD-10-CM | POA: Diagnosis not present

## 2019-02-03 DIAGNOSIS — R5383 Other fatigue: Secondary | ICD-10-CM | POA: Diagnosis not present

## 2019-02-03 LAB — TSH: TSH: 1.62 u[IU]/mL (ref 0.450–4.500)

## 2019-02-03 LAB — T4, FREE: Free T4: 1.39 ng/dL (ref 0.82–1.77)

## 2019-02-03 MED ORDER — LEVOTHYROXINE SODIUM 25 MCG PO TABS: 25.0000 ug | ORAL_TABLET | Freq: Every day | ORAL | 1 refills | Status: AC

## 2019-02-04 ENCOUNTER — Ambulatory Visit: Payer: Medicare Other | Attending: Family Medicine | Admitting: Licensed Clinical Social Worker

## 2019-02-04 ENCOUNTER — Telehealth: Payer: Self-pay | Admitting: Licensed Clinical Social Worker

## 2019-02-04 ENCOUNTER — Other Ambulatory Visit: Payer: Self-pay

## 2019-02-04 NOTE — Telephone Encounter (Signed)
Call placed to patient regarding scheduled [facility] appointment. LCSW left message for a return call.

## 2019-02-08 ENCOUNTER — Telehealth: Payer: Self-pay | Admitting: Family Medicine

## 2019-02-08 NOTE — Telephone Encounter (Signed)
Death certification was dropped off

## 2019-02-09 ENCOUNTER — Ambulatory Visit: Payer: Medicare Other

## 2019-02-14 ENCOUNTER — Other Ambulatory Visit: Payer: Self-pay | Admitting: Family Medicine

## 2019-02-14 DIAGNOSIS — E039 Hypothyroidism, unspecified: Secondary | ICD-10-CM

## 2019-02-14 DIAGNOSIS — 419620001 Death: Secondary | SNOMED CT | POA: Diagnosis not present

## 2019-02-14 NOTE — Progress Notes (Signed)
Received call from [facility] answering service. EMS notifying of death of patient. Will drop off death certificate tomorrow.

## 2019-02-14 DEATH — deceased

## 2019-02-16 ENCOUNTER — Other Ambulatory Visit: Payer: Self-pay | Admitting: Family Medicine

## 2019-02-16 ENCOUNTER — Ambulatory Visit: Payer: Medicare Other | Admitting: Internal Medicine

## 2019-02-16 DIAGNOSIS — E1149 Type 2 diabetes mellitus with other diabetic neurological complication: Secondary | ICD-10-CM

## 2019-02-23 ENCOUNTER — Ambulatory Visit: Payer: Medicare Other | Admitting: Podiatry

## 2019-02-26 ENCOUNTER — Other Ambulatory Visit: Payer: Self-pay | Admitting: *Deleted

## 2019-02-26 ENCOUNTER — Other Ambulatory Visit: Payer: Self-pay | Admitting: Internal Medicine

## 2019-03-02 ENCOUNTER — Ambulatory Visit: Payer: Medicare Other | Admitting: Cardiovascular Disease

## 2019-03-18 ENCOUNTER — Ambulatory Visit: Payer: Medicare Other | Admitting: Cardiovascular Disease

## 2019-03-23 ENCOUNTER — Ambulatory Visit: Payer: Medicare Other

## 2019-03-29 IMAGING — MG DIGITAL DIAGNOSTIC UNILATERAL LEFT MAMMOGRAM
3 series · 3 of 3 positions shown · non-contrast
Comparison: 12/30/2017 and earlier

CLINICAL DATA: The patient returns after screening study for
evaluation of LEFT breast calcifications.

EXAM:
DIGITAL DIAGNOSTIC LEFT MAMMOGRAM WITH CAD

[L CC]
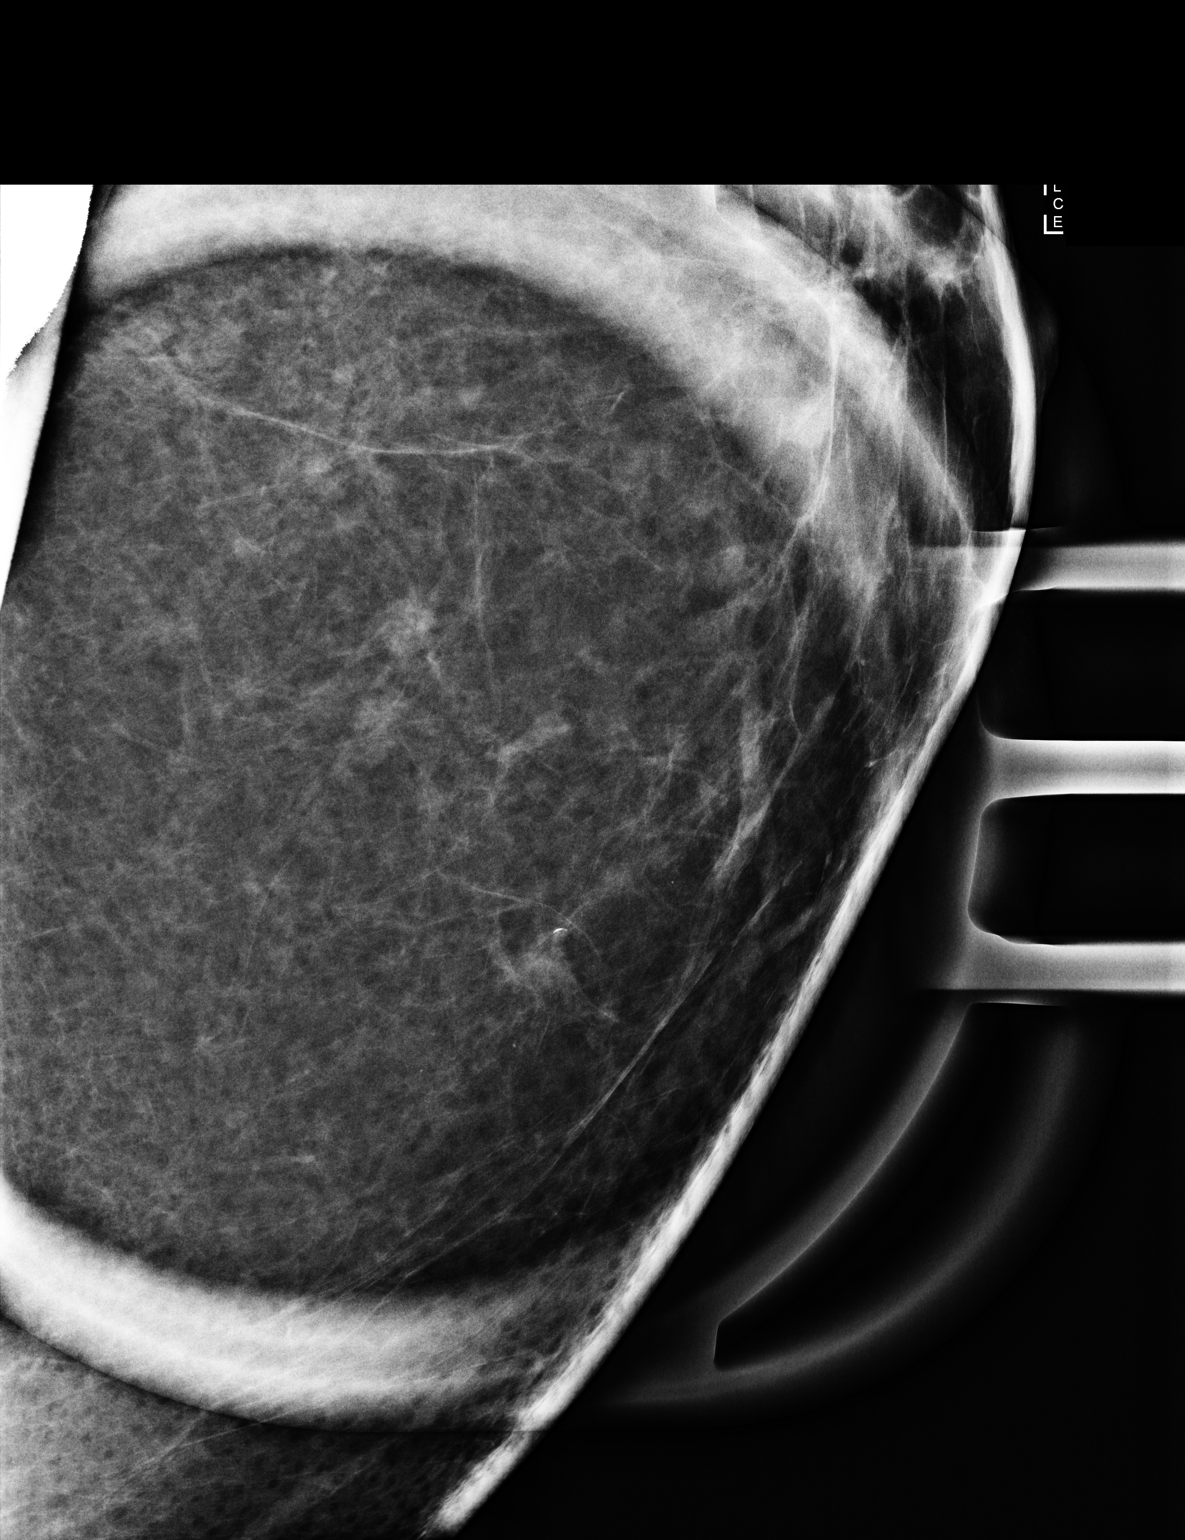

[L ML (1 of 2)]
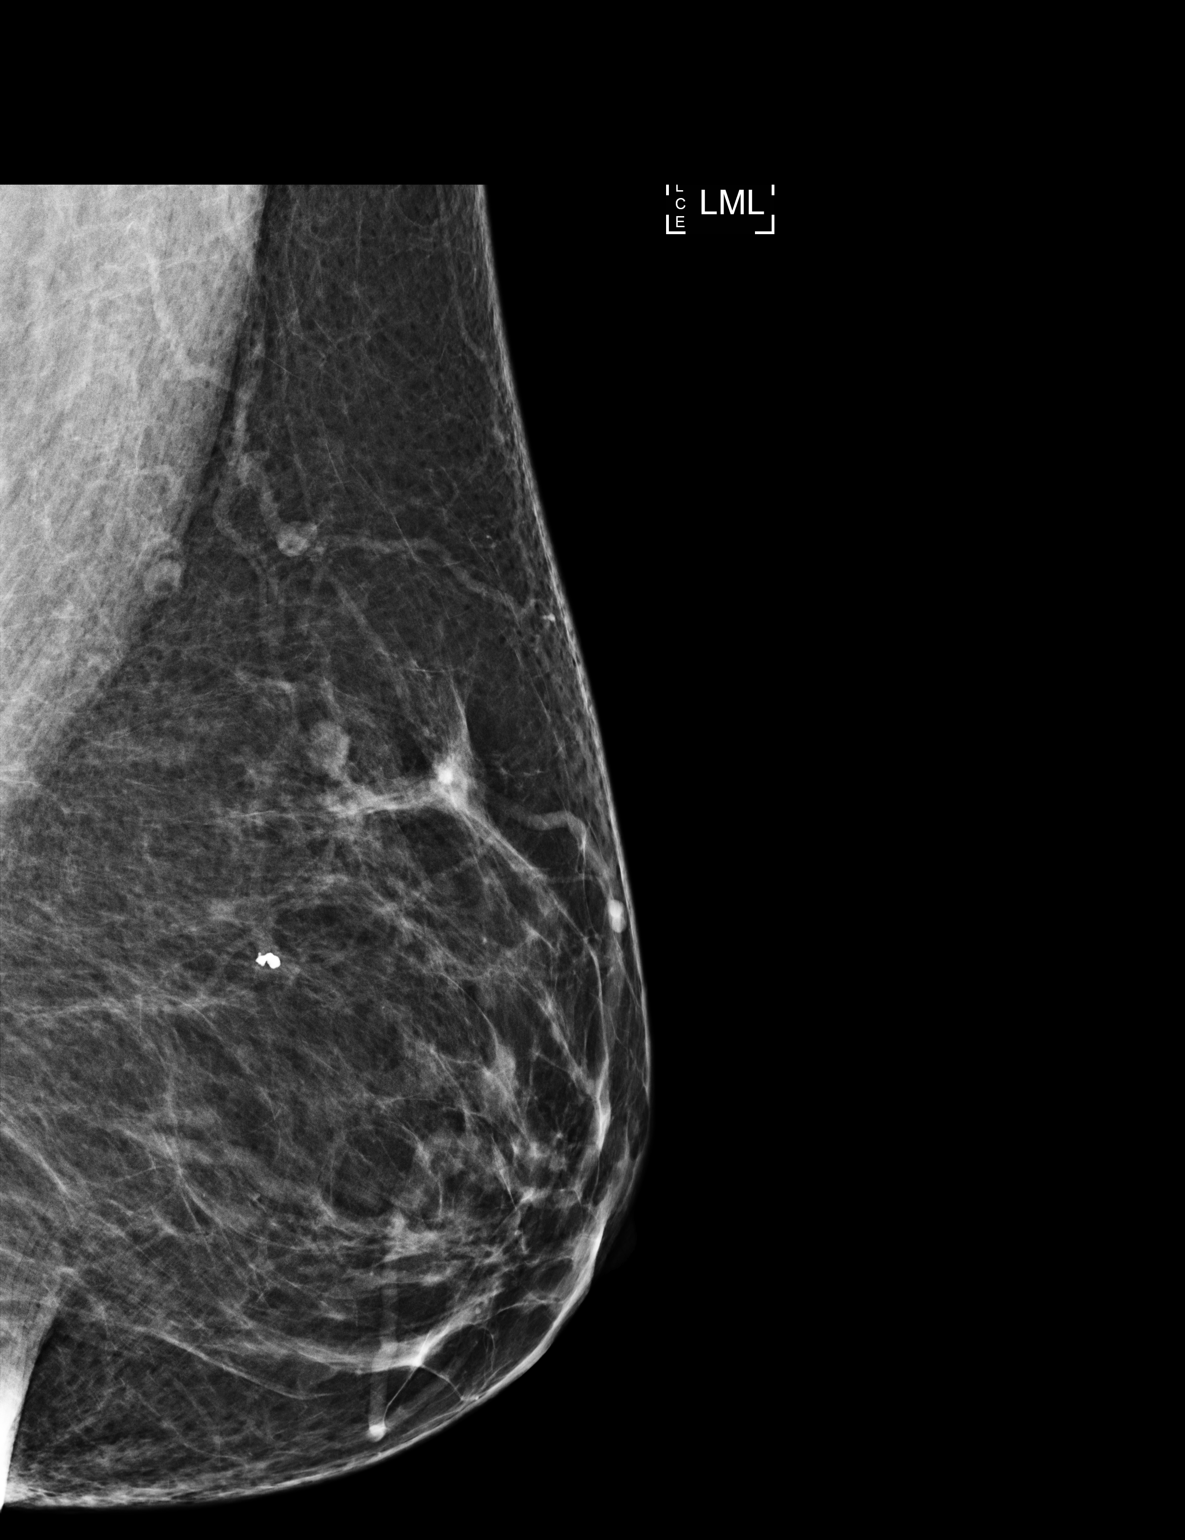

[L ML (2 of 2)]
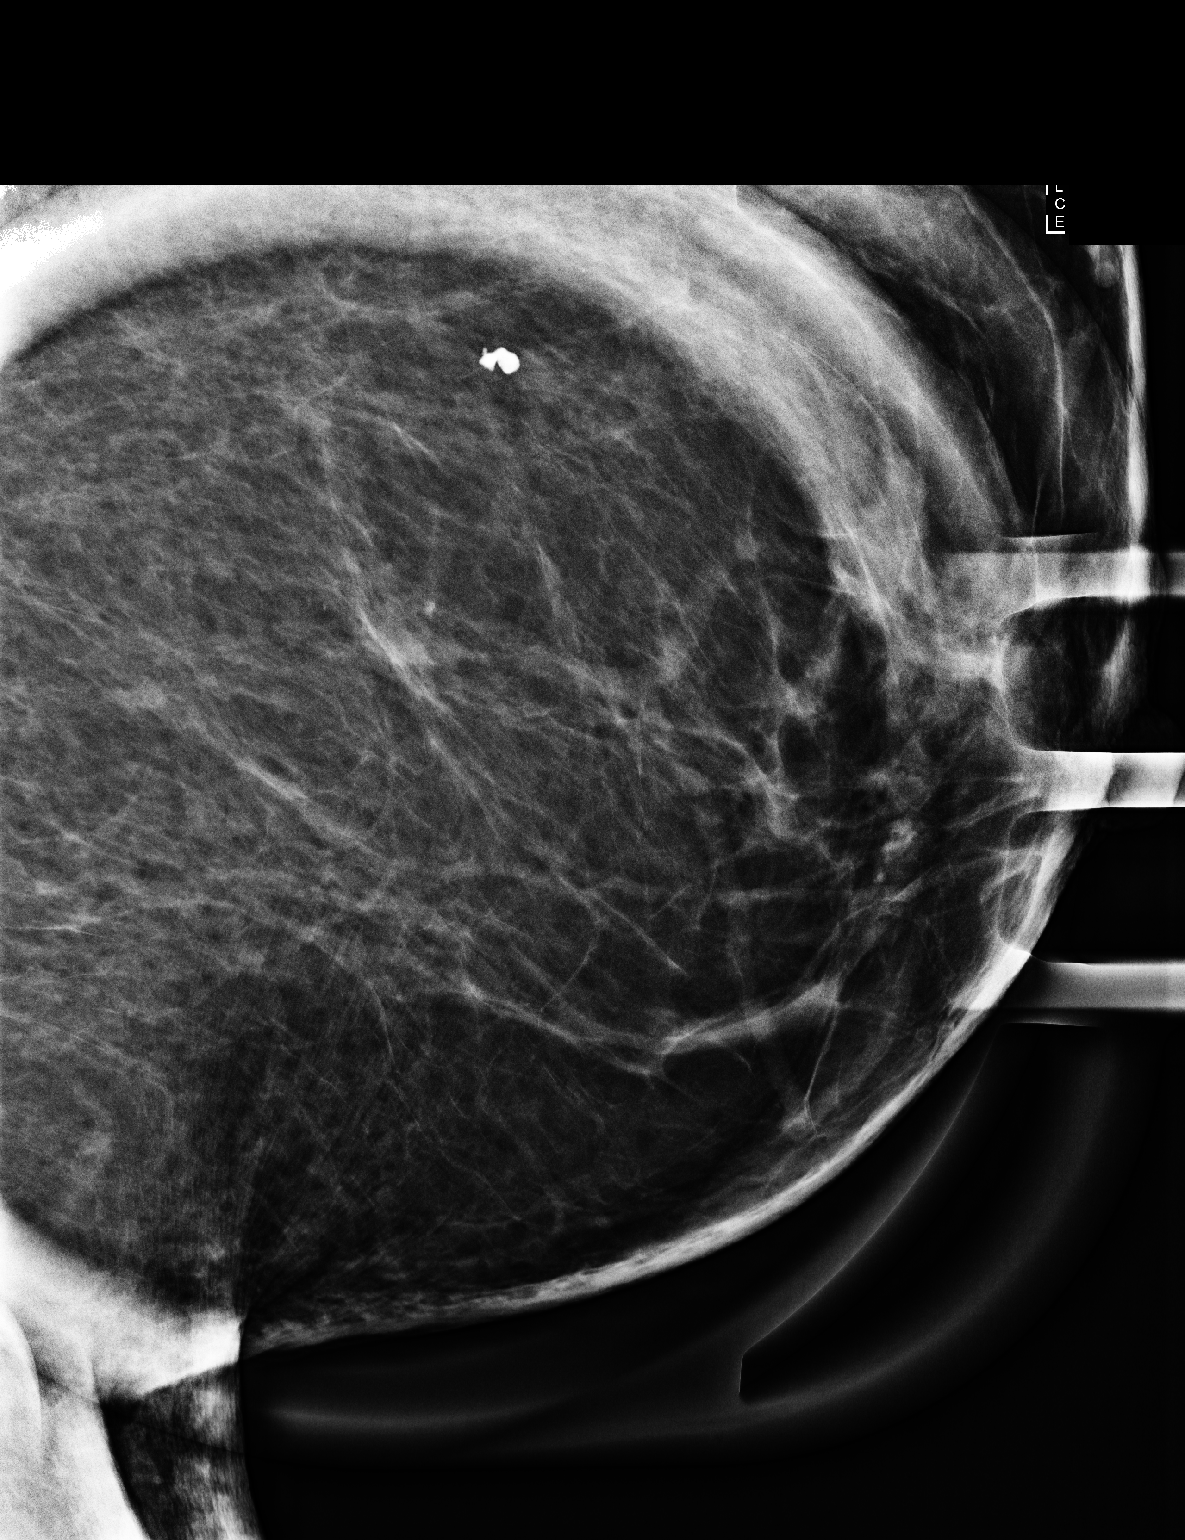

[3 of 3 positions shown; findings below may reference images not displayed]

ACR Breast Density Category b: There are scattered areas of
fibroglandular density.
FINDINGS: Magnified views are performed of the LOWER INNER QUADRANT of the
LEFT breast. On magnified views, there laterally dispersed punctate
and dystrophic type calcifications. No suspicious morphology or
distribution of calcifications identified.

Mammographic images were processed with CAD.
IMPRESSION: Benign LEFT breast calcifications. No mammographic evidence for
malignancy.

RECOMMENDATION:
Screening mammogram in one year.(Code:BS-A-8CR)

I have discussed the findings and recommendations with the patient.
Results were also provided in writing at the conclusion of the
visit. If applicable, a reminder letter will be sent to the patient
regarding the next appointment.

BI-RADS CATEGORY  2: Benign.

## 2019-03-30 ENCOUNTER — Other Ambulatory Visit: Payer: Self-pay | Admitting: Family Medicine

## 2019-03-30 DIAGNOSIS — I739 Peripheral vascular disease, unspecified: Secondary | ICD-10-CM

## 2019-05-04 ENCOUNTER — Ambulatory Visit: Payer: Medicare Other | Admitting: Family Medicine
